# Patient Record
Sex: Female | Born: 1975 | Race: White | Hispanic: No | Marital: Married | Smoking: Never smoker
Health system: Southern US, Community
[De-identification: ages and names within clinical notes are randomized; demographics above are authoritative.]

## PROBLEM LIST (undated history)

## (undated) DIAGNOSIS — F419 Anxiety disorder, unspecified: Secondary | ICD-10-CM

## (undated) DIAGNOSIS — Z8669 Personal history of other diseases of the nervous system and sense organs: Secondary | ICD-10-CM

## (undated) DIAGNOSIS — Z9289 Personal history of other medical treatment: Secondary | ICD-10-CM

## (undated) DIAGNOSIS — F988 Other specified behavioral and emotional disorders with onset usually occurring in childhood and adolescence: Secondary | ICD-10-CM

## (undated) DIAGNOSIS — R1310 Dysphagia, unspecified: Secondary | ICD-10-CM

## (undated) DIAGNOSIS — F329 Major depressive disorder, single episode, unspecified: Secondary | ICD-10-CM

## (undated) DIAGNOSIS — R519 Headache, unspecified: Secondary | ICD-10-CM

## (undated) DIAGNOSIS — E119 Type 2 diabetes mellitus without complications: Secondary | ICD-10-CM

## (undated) DIAGNOSIS — K8020 Calculus of gallbladder without cholecystitis without obstruction: Secondary | ICD-10-CM

## (undated) DIAGNOSIS — K589 Irritable bowel syndrome without diarrhea: Secondary | ICD-10-CM

## (undated) DIAGNOSIS — R569 Unspecified convulsions: Secondary | ICD-10-CM

## (undated) DIAGNOSIS — C7961 Secondary malignant neoplasm of right ovary: Secondary | ICD-10-CM

## (undated) DIAGNOSIS — D509 Iron deficiency anemia, unspecified: Secondary | ICD-10-CM

## (undated) DIAGNOSIS — C189 Malignant neoplasm of colon, unspecified: Secondary | ICD-10-CM

## (undated) DIAGNOSIS — F32A Depression, unspecified: Secondary | ICD-10-CM

## (undated) DIAGNOSIS — K219 Gastro-esophageal reflux disease without esophagitis: Secondary | ICD-10-CM

## (undated) DIAGNOSIS — G2581 Restless legs syndrome: Secondary | ICD-10-CM

## (undated) DIAGNOSIS — R51 Headache: Secondary | ICD-10-CM

## (undated) DIAGNOSIS — R198 Other specified symptoms and signs involving the digestive system and abdomen: Secondary | ICD-10-CM

## (undated) HISTORY — DX: Type 2 diabetes mellitus without complications: E11.9

## (undated) HISTORY — DX: Depression, unspecified: F32.A

## (undated) HISTORY — PX: OTHER SURGICAL HISTORY: SHX169

## (undated) HISTORY — DX: Irritable bowel syndrome, unspecified: K58.9

## (undated) HISTORY — DX: Unspecified convulsions: R56.9

## (undated) HISTORY — DX: Major depressive disorder, single episode, unspecified: F32.9

## (undated) HISTORY — DX: Anxiety disorder, unspecified: F41.9

## (undated) HISTORY — DX: Iron deficiency anemia, unspecified: D50.9

## (undated) HISTORY — DX: Gastro-esophageal reflux disease without esophagitis: K21.9

## (undated) HISTORY — DX: Calculus of gallbladder without cholecystitis without obstruction: K80.20

---
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
## 2000-02-07 ENCOUNTER — Encounter: Admission: RE | Admit: 2000-02-07 | Discharge: 2000-02-07 | Payer: Self-pay | Admitting: Family Medicine

## 2000-02-20 ENCOUNTER — Encounter: Admission: RE | Admit: 2000-02-20 | Discharge: 2000-02-20 | Payer: Self-pay | Admitting: Sports Medicine

## 2000-03-20 ENCOUNTER — Encounter: Admission: RE | Admit: 2000-03-20 | Discharge: 2000-03-20 | Payer: Self-pay | Admitting: Family Medicine

## 2000-04-17 ENCOUNTER — Encounter: Admission: RE | Admit: 2000-04-17 | Discharge: 2000-04-17 | Payer: Self-pay | Admitting: Family Medicine

## 2000-04-22 ENCOUNTER — Ambulatory Visit (HOSPITAL_COMMUNITY): Admission: RE | Admit: 2000-04-22 | Discharge: 2000-04-22 | Payer: Self-pay | Admitting: Family Medicine

## 2000-05-26 ENCOUNTER — Emergency Department (HOSPITAL_COMMUNITY): Admission: EM | Admit: 2000-05-26 | Discharge: 2000-05-26 | Payer: Self-pay | Admitting: *Deleted

## 2000-05-26 ENCOUNTER — Encounter: Payer: Self-pay | Admitting: *Deleted

## 2000-05-27 ENCOUNTER — Encounter: Admission: RE | Admit: 2000-05-27 | Discharge: 2000-05-27 | Payer: Self-pay | Admitting: Family Medicine

## 2000-06-10 ENCOUNTER — Encounter: Admission: RE | Admit: 2000-06-10 | Discharge: 2000-06-10 | Payer: Self-pay | Admitting: Family Medicine

## 2000-06-25 ENCOUNTER — Encounter: Admission: RE | Admit: 2000-06-25 | Discharge: 2000-06-25 | Payer: Self-pay | Admitting: Family Medicine

## 2000-07-09 ENCOUNTER — Encounter: Admission: RE | Admit: 2000-07-09 | Discharge: 2000-07-09 | Payer: Self-pay | Admitting: Family Medicine

## 2000-07-23 ENCOUNTER — Encounter: Admission: RE | Admit: 2000-07-23 | Discharge: 2000-07-23 | Payer: Self-pay | Admitting: Sports Medicine

## 2000-08-06 ENCOUNTER — Encounter: Admission: RE | Admit: 2000-08-06 | Discharge: 2000-08-06 | Payer: Self-pay | Admitting: Family Medicine

## 2000-08-20 ENCOUNTER — Encounter: Admission: RE | Admit: 2000-08-20 | Discharge: 2000-08-20 | Payer: Self-pay | Admitting: Family Medicine

## 2000-09-03 ENCOUNTER — Encounter: Admission: RE | Admit: 2000-09-03 | Discharge: 2000-09-03 | Payer: Self-pay | Admitting: Family Medicine

## 2000-09-09 ENCOUNTER — Encounter: Admission: RE | Admit: 2000-09-09 | Discharge: 2000-09-09 | Payer: Self-pay | Admitting: Family Medicine

## 2000-09-16 ENCOUNTER — Encounter: Admission: RE | Admit: 2000-09-16 | Discharge: 2000-09-16 | Payer: Self-pay | Admitting: Family Medicine

## 2000-09-19 ENCOUNTER — Inpatient Hospital Stay (HOSPITAL_COMMUNITY): Admission: AD | Admit: 2000-09-19 | Discharge: 2000-09-21 | Payer: Self-pay | Admitting: Obstetrics

## 2000-10-22 ENCOUNTER — Encounter: Admission: RE | Admit: 2000-10-22 | Discharge: 2000-10-22 | Payer: Self-pay | Admitting: Obstetrics & Gynecology

## 2000-10-22 ENCOUNTER — Other Ambulatory Visit: Admission: RE | Admit: 2000-10-22 | Discharge: 2000-10-22 | Payer: Self-pay | Admitting: Obstetrics & Gynecology

## 2000-11-04 ENCOUNTER — Ambulatory Visit (HOSPITAL_COMMUNITY): Admission: RE | Admit: 2000-11-04 | Discharge: 2000-11-04 | Payer: Self-pay | Admitting: Obstetrics & Gynecology

## 2000-11-07 HISTORY — PX: TUBAL LIGATION: SHX77

## 2001-04-02 ENCOUNTER — Encounter: Admission: RE | Admit: 2001-04-02 | Discharge: 2001-04-02 | Payer: Self-pay | Admitting: Family Medicine

## 2001-05-02 ENCOUNTER — Encounter: Admission: RE | Admit: 2001-05-02 | Discharge: 2001-05-02 | Payer: Self-pay | Admitting: Family Medicine

## 2001-06-25 ENCOUNTER — Encounter: Admission: RE | Admit: 2001-06-25 | Discharge: 2001-06-25 | Payer: Self-pay | Admitting: Family Medicine

## 2001-12-19 ENCOUNTER — Emergency Department (HOSPITAL_COMMUNITY): Admission: EM | Admit: 2001-12-19 | Discharge: 2001-12-19 | Payer: Self-pay | Admitting: Emergency Medicine

## 2002-05-01 ENCOUNTER — Encounter: Admission: RE | Admit: 2002-05-01 | Discharge: 2002-05-01 | Payer: Self-pay | Admitting: Family Medicine

## 2002-06-01 ENCOUNTER — Encounter: Admission: RE | Admit: 2002-06-01 | Discharge: 2002-06-01 | Payer: Self-pay | Admitting: Family Medicine

## 2002-08-03 ENCOUNTER — Other Ambulatory Visit: Admission: RE | Admit: 2002-08-03 | Discharge: 2002-08-03 | Payer: Self-pay | Admitting: Family Medicine

## 2002-08-03 ENCOUNTER — Encounter: Payer: Self-pay | Admitting: Family Medicine

## 2002-08-03 ENCOUNTER — Encounter: Admission: RE | Admit: 2002-08-03 | Discharge: 2002-08-03 | Payer: Self-pay | Admitting: Family Medicine

## 2002-08-28 ENCOUNTER — Encounter: Admission: RE | Admit: 2002-08-28 | Discharge: 2002-08-28 | Payer: Self-pay | Admitting: Sports Medicine

## 2002-09-01 ENCOUNTER — Ambulatory Visit (HOSPITAL_COMMUNITY): Admission: RE | Admit: 2002-09-01 | Discharge: 2002-09-01 | Payer: Self-pay | Admitting: Sports Medicine

## 2002-09-11 ENCOUNTER — Encounter: Admission: RE | Admit: 2002-09-11 | Discharge: 2002-09-11 | Payer: Self-pay | Admitting: Family Medicine

## 2002-10-09 ENCOUNTER — Encounter: Admission: RE | Admit: 2002-10-09 | Discharge: 2002-10-09 | Payer: Self-pay | Admitting: Family Medicine

## 2002-11-06 ENCOUNTER — Encounter: Admission: RE | Admit: 2002-11-06 | Discharge: 2002-11-06 | Payer: Self-pay | Admitting: Family Medicine

## 2003-01-12 ENCOUNTER — Emergency Department (HOSPITAL_COMMUNITY): Admission: EM | Admit: 2003-01-12 | Discharge: 2003-01-12 | Payer: Self-pay | Admitting: Emergency Medicine

## 2003-06-14 ENCOUNTER — Encounter: Admission: RE | Admit: 2003-06-14 | Discharge: 2003-06-14 | Payer: Self-pay | Admitting: Sports Medicine

## 2003-06-15 ENCOUNTER — Encounter: Admission: RE | Admit: 2003-06-15 | Discharge: 2003-06-15 | Payer: Self-pay | Admitting: Family Medicine

## 2003-07-12 ENCOUNTER — Encounter: Admission: RE | Admit: 2003-07-12 | Discharge: 2003-07-12 | Payer: Self-pay | Admitting: Family Medicine

## 2003-09-17 ENCOUNTER — Encounter: Admission: RE | Admit: 2003-09-17 | Discharge: 2003-09-17 | Payer: Self-pay | Admitting: Family Medicine

## 2003-11-01 ENCOUNTER — Ambulatory Visit (HOSPITAL_COMMUNITY): Admission: RE | Admit: 2003-11-01 | Discharge: 2003-11-01 | Payer: Self-pay | Admitting: Family Medicine

## 2003-11-01 ENCOUNTER — Ambulatory Visit: Payer: Self-pay | Admitting: Family Medicine

## 2003-11-01 HISTORY — PX: CYSTOSCOPY: SHX5120

## 2003-11-01 HISTORY — PX: ENDOMETRIAL FULGURATION: SHX1500

## 2003-11-01 HISTORY — PX: LAPAROSCOPIC LYSIS OF ADHESIONS: SHX5905

## 2003-11-15 ENCOUNTER — Ambulatory Visit: Payer: Self-pay | Admitting: Family Medicine

## 2003-11-18 ENCOUNTER — Ambulatory Visit: Payer: Self-pay | Admitting: Family Medicine

## 2003-11-29 ENCOUNTER — Ambulatory Visit: Payer: Self-pay | Admitting: Family Medicine

## 2004-01-13 ENCOUNTER — Ambulatory Visit: Payer: Self-pay | Admitting: Family Medicine

## 2004-02-10 ENCOUNTER — Ambulatory Visit: Payer: Self-pay | Admitting: Family Medicine

## 2004-03-29 ENCOUNTER — Emergency Department (HOSPITAL_COMMUNITY): Admission: EM | Admit: 2004-03-29 | Discharge: 2004-03-29 | Payer: Self-pay | Admitting: Emergency Medicine

## 2004-06-05 ENCOUNTER — Encounter: Payer: Self-pay | Admitting: Pulmonary Disease

## 2004-06-05 ENCOUNTER — Ambulatory Visit: Admission: RE | Admit: 2004-06-05 | Discharge: 2004-06-05 | Payer: Self-pay | Admitting: Family Medicine

## 2004-06-09 ENCOUNTER — Ambulatory Visit: Payer: Self-pay | Admitting: Pulmonary Disease

## 2004-08-24 ENCOUNTER — Encounter: Payer: Self-pay | Admitting: Obstetrics and Gynecology

## 2004-08-24 ENCOUNTER — Inpatient Hospital Stay (HOSPITAL_COMMUNITY): Admission: RE | Admit: 2004-08-24 | Discharge: 2004-08-27 | Payer: Self-pay | Admitting: Obstetrics and Gynecology

## 2004-08-24 HISTORY — PX: ABDOMINAL HYSTERECTOMY: SHX81

## 2004-08-24 HISTORY — PX: LEFT OOPHORECTOMY: SHX1961

## 2004-08-24 HISTORY — PX: PANNICULECTOMY: SHX5360

## 2004-08-24 HISTORY — PX: APPENDECTOMY: SHX54

## 2004-08-24 HISTORY — PX: UNILATERAL SALPINGECTOMY: SHX6160

## 2004-08-31 ENCOUNTER — Ambulatory Visit: Payer: Self-pay | Admitting: Pulmonary Disease

## 2004-10-23 ENCOUNTER — Ambulatory Visit: Payer: Self-pay | Admitting: Pulmonary Disease

## 2004-11-20 ENCOUNTER — Encounter (INDEPENDENT_AMBULATORY_CARE_PROVIDER_SITE_OTHER): Payer: Self-pay | Admitting: Family Medicine

## 2004-11-20 ENCOUNTER — Ambulatory Visit: Payer: Self-pay | Admitting: Pulmonary Disease

## 2006-01-07 ENCOUNTER — Other Ambulatory Visit: Admission: RE | Admit: 2006-01-07 | Discharge: 2006-01-07 | Payer: Self-pay | Admitting: Emergency Medicine

## 2006-02-24 ENCOUNTER — Emergency Department (HOSPITAL_COMMUNITY): Admission: EM | Admit: 2006-02-24 | Discharge: 2006-02-24 | Payer: Self-pay | Admitting: Emergency Medicine

## 2006-04-07 ENCOUNTER — Emergency Department (HOSPITAL_COMMUNITY): Admission: EM | Admit: 2006-04-07 | Discharge: 2006-04-07 | Payer: Self-pay | Admitting: Emergency Medicine

## 2006-08-16 ENCOUNTER — Ambulatory Visit: Payer: Self-pay | Admitting: Family Medicine

## 2006-08-16 ENCOUNTER — Ambulatory Visit (HOSPITAL_COMMUNITY): Admission: RE | Admit: 2006-08-16 | Discharge: 2006-08-16 | Payer: Self-pay | Admitting: Family Medicine

## 2006-08-16 DIAGNOSIS — K589 Irritable bowel syndrome without diarrhea: Secondary | ICD-10-CM

## 2006-08-16 DIAGNOSIS — G2581 Restless legs syndrome: Secondary | ICD-10-CM

## 2006-08-16 DIAGNOSIS — K219 Gastro-esophageal reflux disease without esophagitis: Secondary | ICD-10-CM | POA: Insufficient documentation

## 2006-08-16 DIAGNOSIS — G43009 Migraine without aura, not intractable, without status migrainosus: Secondary | ICD-10-CM | POA: Insufficient documentation

## 2006-08-16 DIAGNOSIS — E785 Hyperlipidemia, unspecified: Secondary | ICD-10-CM | POA: Insufficient documentation

## 2006-08-16 LAB — CONVERTED CEMR LAB: Pap Smear: NORMAL

## 2006-08-17 ENCOUNTER — Encounter (INDEPENDENT_AMBULATORY_CARE_PROVIDER_SITE_OTHER): Payer: Self-pay | Admitting: Family Medicine

## 2006-08-18 LAB — CONVERTED CEMR LAB
ALT: 95 units/L — ABNORMAL HIGH (ref 0–35)
AST: 64 units/L — ABNORMAL HIGH (ref 0–37)
Albumin: 4.4 g/dL (ref 3.5–5.2)
CO2: 21 meq/L (ref 19–32)
Calcium: 9.5 mg/dL (ref 8.4–10.5)
Cholesterol: 199 mg/dL (ref 0–200)
Eosinophils Absolute: 0.1 10*3/uL (ref 0.0–0.7)
HCT: 44 % (ref 36.0–46.0)
HDL: 37 mg/dL — ABNORMAL LOW (ref >39)
LDL Cholesterol: 129 mg/dL — ABNORMAL HIGH (ref 0–99)
Lymphs Abs: 1.7 10*3/uL (ref 0.7–3.3)
Monocytes Absolute: 0.3 10*3/uL (ref 0.2–0.7)
Neutro Abs: 3.6 10*3/uL (ref 1.7–7.7)
Neutrophils Relative %: 64 % (ref 43–77)
Platelets: 204 10*3/uL (ref 150–400)
RBC: 5.15 M/uL — ABNORMAL HIGH (ref 3.87–5.11)
Total Bilirubin: 0.8 mg/dL (ref 0.3–1.2)
Total CHOL/HDL Ratio: 5.4
Total Protein: 7.5 g/dL (ref 6.0–8.3)
Triglycerides: 165 mg/dL — ABNORMAL HIGH (ref <150)
WBC: 5.7 10*3/uL (ref 4.0–10.5)

## 2006-08-19 ENCOUNTER — Telehealth (INDEPENDENT_AMBULATORY_CARE_PROVIDER_SITE_OTHER): Payer: Self-pay | Admitting: *Deleted

## 2006-08-20 ENCOUNTER — Encounter (INDEPENDENT_AMBULATORY_CARE_PROVIDER_SITE_OTHER): Payer: Self-pay | Admitting: Family Medicine

## 2006-08-30 ENCOUNTER — Ambulatory Visit: Payer: Self-pay | Admitting: Family Medicine

## 2006-08-31 ENCOUNTER — Encounter (INDEPENDENT_AMBULATORY_CARE_PROVIDER_SITE_OTHER): Payer: Self-pay | Admitting: Family Medicine

## 2006-09-02 LAB — CONVERTED CEMR LAB
HCV Ab: NEGATIVE
Hep A IgM: NEGATIVE
Hepatitis B Surface Ag: NEGATIVE

## 2006-09-03 ENCOUNTER — Telehealth (INDEPENDENT_AMBULATORY_CARE_PROVIDER_SITE_OTHER): Payer: Self-pay | Admitting: *Deleted

## 2006-09-03 ENCOUNTER — Encounter (INDEPENDENT_AMBULATORY_CARE_PROVIDER_SITE_OTHER): Payer: Self-pay | Admitting: Family Medicine

## 2006-09-05 ENCOUNTER — Ambulatory Visit (HOSPITAL_COMMUNITY): Admission: RE | Admit: 2006-09-05 | Discharge: 2006-09-05 | Payer: Self-pay | Admitting: Family Medicine

## 2006-09-05 ENCOUNTER — Encounter (INDEPENDENT_AMBULATORY_CARE_PROVIDER_SITE_OTHER): Payer: Self-pay | Admitting: Family Medicine

## 2006-09-05 ENCOUNTER — Telehealth (INDEPENDENT_AMBULATORY_CARE_PROVIDER_SITE_OTHER): Payer: Self-pay | Admitting: *Deleted

## 2006-09-06 ENCOUNTER — Encounter (INDEPENDENT_AMBULATORY_CARE_PROVIDER_SITE_OTHER): Payer: Self-pay | Admitting: Family Medicine

## 2006-09-18 ENCOUNTER — Encounter (INDEPENDENT_AMBULATORY_CARE_PROVIDER_SITE_OTHER): Payer: Self-pay | Admitting: Family Medicine

## 2006-09-19 ENCOUNTER — Ambulatory Visit: Payer: Self-pay | Admitting: Family Medicine

## 2006-09-19 DIAGNOSIS — F341 Dysthymic disorder: Secondary | ICD-10-CM | POA: Insufficient documentation

## 2006-09-30 ENCOUNTER — Ambulatory Visit: Payer: Self-pay | Admitting: Family Medicine

## 2006-10-11 ENCOUNTER — Ambulatory Visit: Payer: Self-pay | Admitting: Family Medicine

## 2006-10-18 ENCOUNTER — Telehealth (INDEPENDENT_AMBULATORY_CARE_PROVIDER_SITE_OTHER): Payer: Self-pay | Admitting: *Deleted

## 2006-10-29 ENCOUNTER — Telehealth (INDEPENDENT_AMBULATORY_CARE_PROVIDER_SITE_OTHER): Payer: Self-pay | Admitting: *Deleted

## 2006-11-12 ENCOUNTER — Ambulatory Visit: Payer: Self-pay | Admitting: Family Medicine

## 2006-11-12 ENCOUNTER — Telehealth (INDEPENDENT_AMBULATORY_CARE_PROVIDER_SITE_OTHER): Payer: Self-pay | Admitting: *Deleted

## 2006-11-13 ENCOUNTER — Encounter (INDEPENDENT_AMBULATORY_CARE_PROVIDER_SITE_OTHER): Payer: Self-pay | Admitting: Family Medicine

## 2006-11-13 LAB — CONVERTED CEMR LAB
Bilirubin, Direct: 0.2 mg/dL (ref 0.0–0.3)
Indirect Bilirubin: 0.7 mg/dL (ref 0.0–0.9)

## 2006-11-21 ENCOUNTER — Ambulatory Visit: Payer: Self-pay | Admitting: Pulmonary Disease

## 2006-12-12 ENCOUNTER — Ambulatory Visit: Payer: Self-pay | Admitting: Family Medicine

## 2006-12-20 ENCOUNTER — Encounter (INDEPENDENT_AMBULATORY_CARE_PROVIDER_SITE_OTHER): Payer: Self-pay | Admitting: Family Medicine

## 2006-12-23 ENCOUNTER — Encounter (INDEPENDENT_AMBULATORY_CARE_PROVIDER_SITE_OTHER): Payer: Self-pay | Admitting: Family Medicine

## 2007-01-01 ENCOUNTER — Encounter (INDEPENDENT_AMBULATORY_CARE_PROVIDER_SITE_OTHER): Payer: Self-pay | Admitting: Family Medicine

## 2007-01-07 ENCOUNTER — Encounter: Payer: Self-pay | Admitting: Pulmonary Disease

## 2007-01-07 ENCOUNTER — Encounter (INDEPENDENT_AMBULATORY_CARE_PROVIDER_SITE_OTHER): Payer: Self-pay | Admitting: Family Medicine

## 2007-01-15 ENCOUNTER — Ambulatory Visit: Payer: Self-pay | Admitting: Family Medicine

## 2007-01-16 ENCOUNTER — Telehealth (INDEPENDENT_AMBULATORY_CARE_PROVIDER_SITE_OTHER): Payer: Self-pay | Admitting: *Deleted

## 2007-01-16 LAB — CONVERTED CEMR LAB: Bilirubin, Direct: 0.1 mg/dL (ref 0.0–0.3)

## 2007-01-21 ENCOUNTER — Telehealth (INDEPENDENT_AMBULATORY_CARE_PROVIDER_SITE_OTHER): Payer: Self-pay | Admitting: *Deleted

## 2007-01-21 ENCOUNTER — Ambulatory Visit: Payer: Self-pay | Admitting: Family Medicine

## 2007-01-22 ENCOUNTER — Telehealth (INDEPENDENT_AMBULATORY_CARE_PROVIDER_SITE_OTHER): Payer: Self-pay | Admitting: *Deleted

## 2007-01-22 ENCOUNTER — Encounter (INDEPENDENT_AMBULATORY_CARE_PROVIDER_SITE_OTHER): Payer: Self-pay | Admitting: Family Medicine

## 2007-01-24 ENCOUNTER — Telehealth (INDEPENDENT_AMBULATORY_CARE_PROVIDER_SITE_OTHER): Payer: Self-pay | Admitting: *Deleted

## 2007-01-27 ENCOUNTER — Encounter (INDEPENDENT_AMBULATORY_CARE_PROVIDER_SITE_OTHER): Payer: Self-pay | Admitting: Family Medicine

## 2007-02-04 ENCOUNTER — Encounter (INDEPENDENT_AMBULATORY_CARE_PROVIDER_SITE_OTHER): Payer: Self-pay | Admitting: Family Medicine

## 2007-02-07 ENCOUNTER — Ambulatory Visit: Payer: Self-pay | Admitting: Pulmonary Disease

## 2007-02-14 ENCOUNTER — Telehealth (INDEPENDENT_AMBULATORY_CARE_PROVIDER_SITE_OTHER): Payer: Self-pay | Admitting: *Deleted

## 2007-02-27 ENCOUNTER — Ambulatory Visit: Payer: Self-pay | Admitting: Family Medicine

## 2007-03-19 ENCOUNTER — Ambulatory Visit: Payer: Self-pay | Admitting: Family Medicine

## 2007-03-27 ENCOUNTER — Ambulatory Visit (HOSPITAL_COMMUNITY): Payer: Self-pay | Admitting: Psychiatry

## 2007-04-01 ENCOUNTER — Ambulatory Visit: Payer: Self-pay | Admitting: Family Medicine

## 2007-04-29 ENCOUNTER — Ambulatory Visit: Payer: Self-pay | Admitting: Family Medicine

## 2007-04-29 LAB — CONVERTED CEMR LAB: Inflenza A Ag: NEGATIVE

## 2007-05-01 ENCOUNTER — Telehealth (INDEPENDENT_AMBULATORY_CARE_PROVIDER_SITE_OTHER): Payer: Self-pay | Admitting: Family Medicine

## 2007-05-06 ENCOUNTER — Ambulatory Visit: Payer: Self-pay | Admitting: Family Medicine

## 2007-05-12 ENCOUNTER — Telehealth (INDEPENDENT_AMBULATORY_CARE_PROVIDER_SITE_OTHER): Payer: Self-pay | Admitting: Family Medicine

## 2007-05-14 ENCOUNTER — Telehealth (INDEPENDENT_AMBULATORY_CARE_PROVIDER_SITE_OTHER): Payer: Self-pay | Admitting: *Deleted

## 2007-05-14 ENCOUNTER — Ambulatory Visit (HOSPITAL_COMMUNITY): Admission: RE | Admit: 2007-05-14 | Discharge: 2007-05-14 | Payer: Self-pay | Admitting: Family Medicine

## 2007-05-15 ENCOUNTER — Encounter (INDEPENDENT_AMBULATORY_CARE_PROVIDER_SITE_OTHER): Payer: Self-pay | Admitting: Family Medicine

## 2007-05-19 ENCOUNTER — Telehealth (INDEPENDENT_AMBULATORY_CARE_PROVIDER_SITE_OTHER): Payer: Self-pay | Admitting: Family Medicine

## 2007-05-19 ENCOUNTER — Ambulatory Visit: Payer: Self-pay | Admitting: Family Medicine

## 2007-05-20 ENCOUNTER — Telehealth (INDEPENDENT_AMBULATORY_CARE_PROVIDER_SITE_OTHER): Payer: Self-pay | Admitting: Family Medicine

## 2007-06-16 ENCOUNTER — Ambulatory Visit: Payer: Self-pay | Admitting: Family Medicine

## 2007-07-18 ENCOUNTER — Ambulatory Visit: Payer: Self-pay | Admitting: Internal Medicine

## 2007-07-21 ENCOUNTER — Telehealth (INDEPENDENT_AMBULATORY_CARE_PROVIDER_SITE_OTHER): Payer: Self-pay | Admitting: *Deleted

## 2007-09-01 ENCOUNTER — Observation Stay (HOSPITAL_COMMUNITY): Admission: EM | Admit: 2007-09-01 | Discharge: 2007-09-03 | Payer: Self-pay | Admitting: Emergency Medicine

## 2007-09-04 ENCOUNTER — Telehealth (INDEPENDENT_AMBULATORY_CARE_PROVIDER_SITE_OTHER): Payer: Self-pay | Admitting: *Deleted

## 2007-09-05 ENCOUNTER — Ambulatory Visit: Payer: Self-pay | Admitting: Family Medicine

## 2007-09-09 ENCOUNTER — Encounter (INDEPENDENT_AMBULATORY_CARE_PROVIDER_SITE_OTHER): Payer: Self-pay | Admitting: Family Medicine

## 2007-09-23 ENCOUNTER — Encounter (INDEPENDENT_AMBULATORY_CARE_PROVIDER_SITE_OTHER): Payer: Self-pay | Admitting: Family Medicine

## 2007-09-29 ENCOUNTER — Encounter (INDEPENDENT_AMBULATORY_CARE_PROVIDER_SITE_OTHER): Payer: Self-pay | Admitting: Family Medicine

## 2007-10-01 ENCOUNTER — Ambulatory Visit: Payer: Self-pay | Admitting: Family Medicine

## 2007-10-31 ENCOUNTER — Ambulatory Visit: Payer: Self-pay | Admitting: Family Medicine

## 2007-11-01 ENCOUNTER — Encounter (INDEPENDENT_AMBULATORY_CARE_PROVIDER_SITE_OTHER): Payer: Self-pay | Admitting: Family Medicine

## 2007-11-01 LAB — CONVERTED CEMR LAB: Phenobarbital: 10.9 ug/mL — ABNORMAL LOW (ref 15.0–40.0)

## 2007-11-03 ENCOUNTER — Telehealth (INDEPENDENT_AMBULATORY_CARE_PROVIDER_SITE_OTHER): Payer: Self-pay | Admitting: *Deleted

## 2007-11-03 LAB — CONVERTED CEMR LAB
BUN: 13 mg/dL (ref 6–23)
Basophils Absolute: 0 10*3/uL (ref 0.0–0.1)
Basophils Relative: 0 % (ref 0–1)
CO2: 20 meq/L (ref 19–32)
Chloride: 106 meq/L (ref 96–112)
Creatinine, Ser: 0.62 mg/dL (ref 0.40–1.20)
Eosinophils Absolute: 0.1 10*3/uL (ref 0.0–0.7)
MCV: 85.7 fL (ref 78.0–100.0)
Neutrophils Relative %: 59 % (ref 43–77)
Platelets: 200 10*3/uL (ref 150–400)
RBC: 4.62 M/uL (ref 3.87–5.11)
Sodium: 138 meq/L (ref 135–145)
WBC: 5.2 10*3/uL (ref 4.0–10.5)

## 2007-11-12 ENCOUNTER — Ambulatory Visit: Payer: Self-pay | Admitting: Family Medicine

## 2007-11-22 ENCOUNTER — Encounter (INDEPENDENT_AMBULATORY_CARE_PROVIDER_SITE_OTHER): Payer: Self-pay | Admitting: Family Medicine

## 2007-11-27 ENCOUNTER — Telehealth (INDEPENDENT_AMBULATORY_CARE_PROVIDER_SITE_OTHER): Payer: Self-pay | Admitting: *Deleted

## 2007-12-18 ENCOUNTER — Emergency Department (HOSPITAL_COMMUNITY): Admission: EM | Admit: 2007-12-18 | Discharge: 2007-12-18 | Payer: Self-pay | Admitting: Emergency Medicine

## 2007-12-24 ENCOUNTER — Ambulatory Visit: Payer: Self-pay | Admitting: Family Medicine

## 2007-12-24 LAB — CONVERTED CEMR LAB

## 2008-01-15 ENCOUNTER — Encounter (INDEPENDENT_AMBULATORY_CARE_PROVIDER_SITE_OTHER): Payer: Self-pay | Admitting: Family Medicine

## 2008-02-18 ENCOUNTER — Ambulatory Visit: Payer: Self-pay | Admitting: Family Medicine

## 2008-02-20 LAB — CONVERTED CEMR LAB
Albumin: 4.6 g/dL (ref 3.5–5.2)
BUN: 14 mg/dL (ref 6–23)
Basophils Relative: 0 % (ref 0–1)
CO2: 23 meq/L (ref 19–32)
Chloride: 102 meq/L (ref 96–112)
Creatinine, Ser: 0.75 mg/dL (ref 0.40–1.20)
Eosinophils Absolute: 0 10*3/uL (ref 0.0–0.7)
Lymphs Abs: 2.2 10*3/uL (ref 0.7–4.0)
Monocytes Absolute: 0.4 10*3/uL (ref 0.1–1.0)
Neutrophils Relative %: 58 % (ref 43–77)
Platelets: 218 10*3/uL (ref 150–400)
Total Bilirubin: 0.4 mg/dL (ref 0.3–1.2)
Total Protein: 7.1 g/dL (ref 6.0–8.3)
VLDL: 36 mg/dL (ref 0–40)

## 2008-03-15 ENCOUNTER — Encounter (INDEPENDENT_AMBULATORY_CARE_PROVIDER_SITE_OTHER): Payer: Self-pay | Admitting: Family Medicine

## 2008-03-18 ENCOUNTER — Telehealth (INDEPENDENT_AMBULATORY_CARE_PROVIDER_SITE_OTHER): Payer: Self-pay | Admitting: Family Medicine

## 2008-03-31 ENCOUNTER — Ambulatory Visit: Payer: Self-pay | Admitting: Family Medicine

## 2008-04-12 ENCOUNTER — Ambulatory Visit: Payer: Self-pay | Admitting: Internal Medicine

## 2008-04-12 LAB — CONVERTED CEMR LAB
Inflenza A Ag: NEGATIVE
Influenza B Ag: NEGATIVE

## 2008-06-02 ENCOUNTER — Ambulatory Visit: Payer: Self-pay | Admitting: Family Medicine

## 2008-06-02 LAB — CONVERTED CEMR LAB
Bilirubin Urine: NEGATIVE
Blood in Urine, dipstick: NEGATIVE
Glucose, Urine, Semiquant: NEGATIVE
Nitrite: NEGATIVE
Protein, U semiquant: NEGATIVE
Urobilinogen, UA: 1
WBC Urine, dipstick: NEGATIVE
pH: 5.5

## 2008-06-10 ENCOUNTER — Encounter (INDEPENDENT_AMBULATORY_CARE_PROVIDER_SITE_OTHER): Payer: Self-pay | Admitting: Family Medicine

## 2008-06-29 ENCOUNTER — Ambulatory Visit: Payer: Self-pay | Admitting: Family Medicine

## 2008-07-04 ENCOUNTER — Emergency Department (HOSPITAL_COMMUNITY): Admission: EM | Admit: 2008-07-04 | Discharge: 2008-07-04 | Payer: Self-pay | Admitting: Emergency Medicine

## 2008-08-02 ENCOUNTER — Telehealth (INDEPENDENT_AMBULATORY_CARE_PROVIDER_SITE_OTHER): Payer: Self-pay | Admitting: Family Medicine

## 2008-08-02 ENCOUNTER — Inpatient Hospital Stay (HOSPITAL_COMMUNITY): Admission: EM | Admit: 2008-08-02 | Discharge: 2008-08-11 | Payer: Self-pay | Admitting: Emergency Medicine

## 2008-08-04 ENCOUNTER — Encounter (INDEPENDENT_AMBULATORY_CARE_PROVIDER_SITE_OTHER): Payer: Self-pay | Admitting: *Deleted

## 2008-08-04 ENCOUNTER — Encounter (INDEPENDENT_AMBULATORY_CARE_PROVIDER_SITE_OTHER): Payer: Self-pay | Admitting: Gastroenterology

## 2008-08-16 ENCOUNTER — Ambulatory Visit: Payer: Self-pay | Admitting: Family Medicine

## 2008-08-16 DIAGNOSIS — K3184 Gastroparesis: Secondary | ICD-10-CM

## 2008-09-10 ENCOUNTER — Ambulatory Visit (HOSPITAL_COMMUNITY): Admission: RE | Admit: 2008-09-10 | Discharge: 2008-09-10 | Payer: Self-pay | Admitting: Obstetrics and Gynecology

## 2008-09-28 ENCOUNTER — Ambulatory Visit: Payer: Self-pay | Admitting: Family Medicine

## 2008-11-24 ENCOUNTER — Ambulatory Visit: Payer: Self-pay | Admitting: Family Medicine

## 2009-01-10 ENCOUNTER — Ambulatory Visit: Payer: Self-pay | Admitting: Family

## 2009-01-10 LAB — CONVERTED CEMR LAB
Basophils Relative: 0.1 % (ref 0.0–3.0)
Eosinophils Absolute: 0.1 10*3/uL (ref 0.0–0.7)
Eosinophils Relative: 1 % (ref 0.0–5.0)
Hemoglobin: 13.4 g/dL (ref 12.0–15.0)
Iron: 51 ug/dL (ref 42–145)
MCHC: 34 g/dL (ref 30.0–36.0)
Platelets: 186 10*3/uL (ref 150.0–400.0)
RBC: 4.63 M/uL (ref 3.87–5.11)
RDW: 13.2 % (ref 11.5–14.6)
Saturation Ratios: 16.9 % — ABNORMAL LOW (ref 20.0–50.0)
Transferrin: 215.7 mg/dL (ref 212.0–360.0)

## 2009-01-11 ENCOUNTER — Encounter: Payer: Self-pay | Admitting: Family

## 2009-02-07 ENCOUNTER — Ambulatory Visit: Payer: Self-pay | Admitting: Family

## 2009-02-07 ENCOUNTER — Telehealth (INDEPENDENT_AMBULATORY_CARE_PROVIDER_SITE_OTHER): Payer: Self-pay | Admitting: *Deleted

## 2009-02-07 LAB — CONVERTED CEMR LAB
AST: 40 units/L — ABNORMAL HIGH (ref 0–37)
Albumin: 4.2 g/dL (ref 3.5–5.2)
BUN: 9 mg/dL (ref 6–23)
Basophils Absolute: 0 10*3/uL (ref 0.0–0.1)
Blood in Urine, dipstick: NEGATIVE
CO2: 26 meq/L (ref 19–32)
Eosinophils Absolute: 0.1 10*3/uL (ref 0.0–0.7)
Glucose, Bld: 108 mg/dL — ABNORMAL HIGH (ref 70–99)
HCT: 39.3 % (ref 36.0–46.0)
Hemoglobin: 13.2 g/dL (ref 12.0–15.0)
Indirect Bilirubin: 0.4 mg/dL (ref 0.0–0.9)
Iron: 40 ug/dL — ABNORMAL LOW (ref 42–145)
Lymphocytes Relative: 30 % (ref 12–46)
MCHC: 33.6 g/dL (ref 30.0–36.0)
MCV: 82.9 fL (ref 78.0–100.0)
Monocytes Relative: 6 % (ref 3–12)
Neutro Abs: 3.5 10*3/uL (ref 1.7–7.7)
Neutrophils Relative %: 63 % (ref 43–77)
Nitrite: NEGATIVE
RBC: 4.74 M/uL (ref 3.87–5.11)
RDW: 13.5 % (ref 11.5–15.5)
Sodium: 141 meq/L (ref 135–145)
Total Protein: 6.8 g/dL (ref 6.0–8.3)
WBC: 5.6 10*3/uL (ref 4.0–10.5)

## 2009-02-08 ENCOUNTER — Encounter: Payer: Self-pay | Admitting: Family

## 2009-02-11 ENCOUNTER — Telehealth (INDEPENDENT_AMBULATORY_CARE_PROVIDER_SITE_OTHER): Payer: Self-pay | Admitting: *Deleted

## 2009-03-03 ENCOUNTER — Telehealth (INDEPENDENT_AMBULATORY_CARE_PROVIDER_SITE_OTHER): Payer: Self-pay | Admitting: *Deleted

## 2009-03-14 ENCOUNTER — Ambulatory Visit: Payer: Self-pay | Admitting: Family

## 2009-03-14 LAB — CONVERTED CEMR LAB: Carbamazepine Lvl: 9.6 ug/mL (ref 4.0–12.0)

## 2009-03-16 ENCOUNTER — Telehealth: Payer: Self-pay | Admitting: Family

## 2009-03-21 ENCOUNTER — Telehealth (INDEPENDENT_AMBULATORY_CARE_PROVIDER_SITE_OTHER): Payer: Self-pay | Admitting: *Deleted

## 2009-03-21 DIAGNOSIS — R7309 Other abnormal glucose: Secondary | ICD-10-CM

## 2009-03-21 DIAGNOSIS — R740 Nonspecific elevation of levels of transaminase and lactic acid dehydrogenase [LDH]: Secondary | ICD-10-CM

## 2009-03-22 ENCOUNTER — Ambulatory Visit: Payer: Self-pay | Admitting: Family

## 2009-03-22 LAB — CONVERTED CEMR LAB
Alkaline Phosphatase: 79 units/L (ref 39–117)
Bilirubin, Direct: 0.1 mg/dL (ref 0.0–0.3)
Hepatitis B Surface Ag: NEGATIVE
Total Bilirubin: 0.5 mg/dL (ref 0.3–1.2)

## 2009-03-23 ENCOUNTER — Encounter: Payer: Self-pay | Admitting: Family

## 2009-03-24 ENCOUNTER — Ambulatory Visit: Payer: Self-pay | Admitting: Diagnostic Radiology

## 2009-03-24 ENCOUNTER — Ambulatory Visit (HOSPITAL_BASED_OUTPATIENT_CLINIC_OR_DEPARTMENT_OTHER): Admission: RE | Admit: 2009-03-24 | Discharge: 2009-03-24 | Payer: Self-pay | Admitting: Internal Medicine

## 2009-03-24 ENCOUNTER — Telehealth (INDEPENDENT_AMBULATORY_CARE_PROVIDER_SITE_OTHER): Payer: Self-pay | Admitting: *Deleted

## 2009-03-24 DIAGNOSIS — K7689 Other specified diseases of liver: Secondary | ICD-10-CM | POA: Insufficient documentation

## 2009-03-28 ENCOUNTER — Ambulatory Visit: Payer: Self-pay | Admitting: Family

## 2009-03-28 ENCOUNTER — Telehealth (INDEPENDENT_AMBULATORY_CARE_PROVIDER_SITE_OTHER): Payer: Self-pay | Admitting: *Deleted

## 2009-03-28 LAB — CONVERTED CEMR LAB
Direct LDL: 160.1 mg/dL
HDL: 48.2 mg/dL (ref >39.00)
TSH: 1.14 microintl units/mL (ref 0.35–5.50)
Total CHOL/HDL Ratio: 5
VLDL: 36.4 mg/dL (ref 0.0–40.0)

## 2009-03-29 ENCOUNTER — Telehealth (INDEPENDENT_AMBULATORY_CARE_PROVIDER_SITE_OTHER): Payer: Self-pay | Admitting: *Deleted

## 2009-03-30 ENCOUNTER — Telehealth (INDEPENDENT_AMBULATORY_CARE_PROVIDER_SITE_OTHER): Payer: Self-pay | Admitting: *Deleted

## 2009-03-30 ENCOUNTER — Encounter (INDEPENDENT_AMBULATORY_CARE_PROVIDER_SITE_OTHER): Payer: Self-pay | Admitting: *Deleted

## 2009-03-31 ENCOUNTER — Telehealth: Payer: Self-pay | Admitting: Family

## 2009-04-04 ENCOUNTER — Telehealth (INDEPENDENT_AMBULATORY_CARE_PROVIDER_SITE_OTHER): Payer: Self-pay | Admitting: *Deleted

## 2009-04-05 ENCOUNTER — Encounter: Payer: Self-pay | Admitting: Internal Medicine

## 2009-04-13 ENCOUNTER — Ambulatory Visit: Payer: Self-pay | Admitting: Family

## 2009-04-13 LAB — CONVERTED CEMR LAB
ALT: 52 units/L — ABNORMAL HIGH (ref 0–35)
Bilirubin, Direct: 0.1 mg/dL (ref 0.0–0.3)
Total Bilirubin: 0.4 mg/dL (ref 0.3–1.2)

## 2009-04-14 ENCOUNTER — Telehealth (INDEPENDENT_AMBULATORY_CARE_PROVIDER_SITE_OTHER): Payer: Self-pay | Admitting: *Deleted

## 2009-04-21 ENCOUNTER — Ambulatory Visit (HOSPITAL_COMMUNITY): Admission: RE | Admit: 2009-04-21 | Discharge: 2009-04-21 | Payer: Self-pay | Admitting: Neurology

## 2009-04-22 ENCOUNTER — Telehealth: Payer: Self-pay | Admitting: Family

## 2009-05-02 ENCOUNTER — Encounter: Payer: Self-pay | Admitting: Internal Medicine

## 2009-05-07 ENCOUNTER — Emergency Department (HOSPITAL_COMMUNITY): Admission: EM | Admit: 2009-05-07 | Discharge: 2009-05-07 | Payer: Self-pay | Admitting: Emergency Medicine

## 2009-05-07 ENCOUNTER — Ambulatory Visit: Payer: Self-pay | Admitting: Family Medicine

## 2009-05-09 ENCOUNTER — Encounter: Payer: Self-pay | Admitting: Internal Medicine

## 2009-05-10 ENCOUNTER — Emergency Department (HOSPITAL_COMMUNITY): Admission: EM | Admit: 2009-05-10 | Discharge: 2009-05-10 | Payer: Self-pay | Admitting: Emergency Medicine

## 2009-05-10 ENCOUNTER — Telehealth: Payer: Self-pay | Admitting: Gastroenterology

## 2009-05-10 ENCOUNTER — Ambulatory Visit: Payer: Self-pay | Admitting: Family

## 2009-05-10 ENCOUNTER — Telehealth: Payer: Self-pay | Admitting: Family

## 2009-05-10 DIAGNOSIS — N83209 Unspecified ovarian cyst, unspecified side: Secondary | ICD-10-CM | POA: Insufficient documentation

## 2009-05-11 ENCOUNTER — Ambulatory Visit: Payer: Self-pay | Admitting: Gastroenterology

## 2009-05-11 ENCOUNTER — Encounter: Payer: Self-pay | Admitting: Internal Medicine

## 2009-05-11 LAB — CONVERTED CEMR LAB
Ferritin: 111.9 ng/mL (ref 10.0–291.0)
Iron: 39 ug/dL — ABNORMAL LOW (ref 42–145)
Saturation Ratios: 14.2 % — ABNORMAL LOW (ref 20.0–50.0)
Transferrin: 196.2 mg/dL — ABNORMAL LOW (ref 212.0–360.0)

## 2009-05-13 ENCOUNTER — Telehealth: Payer: Self-pay | Admitting: Family

## 2009-05-16 ENCOUNTER — Ambulatory Visit: Payer: Self-pay | Admitting: Internal Medicine

## 2009-05-17 ENCOUNTER — Ambulatory Visit: Payer: Self-pay | Admitting: Family

## 2009-05-17 DIAGNOSIS — R799 Abnormal finding of blood chemistry, unspecified: Secondary | ICD-10-CM

## 2009-05-17 DIAGNOSIS — D509 Iron deficiency anemia, unspecified: Secondary | ICD-10-CM | POA: Insufficient documentation

## 2009-05-18 ENCOUNTER — Ambulatory Visit: Payer: Self-pay | Admitting: Radiology

## 2009-05-18 ENCOUNTER — Ambulatory Visit (HOSPITAL_BASED_OUTPATIENT_CLINIC_OR_DEPARTMENT_OTHER): Admission: RE | Admit: 2009-05-18 | Discharge: 2009-05-18 | Payer: Self-pay | Admitting: Internal Medicine

## 2009-05-19 ENCOUNTER — Telehealth: Payer: Self-pay | Admitting: Internal Medicine

## 2009-05-19 LAB — CONVERTED CEMR LAB
Ceruloplasmin: 31 mg/dL (ref 21–63)
Hep B S Ab: NEGATIVE
Hepatitis B Surface Ag: NEGATIVE

## 2009-05-20 ENCOUNTER — Telehealth: Payer: Self-pay | Admitting: Family

## 2009-05-30 ENCOUNTER — Telehealth: Payer: Self-pay | Admitting: Internal Medicine

## 2009-06-01 ENCOUNTER — Ambulatory Visit: Payer: Self-pay | Admitting: Internal Medicine

## 2009-06-01 LAB — CONVERTED CEMR LAB
A-1 Antitrypsin, Ser: 183 mg/dL (ref 83–200)
ANA Titer 1: 1:160 {titer} — ABNORMAL HIGH
Ceruloplasmin: 33 mg/dL (ref 21–63)

## 2009-06-02 ENCOUNTER — Telehealth: Payer: Self-pay | Admitting: Family

## 2009-06-15 ENCOUNTER — Ambulatory Visit: Payer: Self-pay | Admitting: Family

## 2009-06-17 ENCOUNTER — Telehealth: Payer: Self-pay | Admitting: Family

## 2009-06-22 ENCOUNTER — Ambulatory Visit: Payer: Self-pay | Admitting: Internal Medicine

## 2009-07-22 ENCOUNTER — Ambulatory Visit (HOSPITAL_COMMUNITY): Admission: RE | Admit: 2009-07-22 | Discharge: 2009-07-22 | Payer: Self-pay | Admitting: Obstetrics and Gynecology

## 2009-07-28 ENCOUNTER — Ambulatory Visit: Payer: Self-pay | Admitting: Family

## 2009-08-05 ENCOUNTER — Telehealth: Payer: Self-pay | Admitting: Family

## 2009-08-08 ENCOUNTER — Emergency Department (HOSPITAL_COMMUNITY): Admission: EM | Admit: 2009-08-08 | Discharge: 2009-08-08 | Payer: Self-pay | Admitting: Emergency Medicine

## 2009-08-16 ENCOUNTER — Encounter: Payer: Self-pay | Admitting: Family

## 2009-08-25 ENCOUNTER — Telehealth (INDEPENDENT_AMBULATORY_CARE_PROVIDER_SITE_OTHER): Payer: Self-pay | Admitting: *Deleted

## 2009-08-30 ENCOUNTER — Encounter: Payer: Self-pay | Admitting: Family

## 2009-09-15 ENCOUNTER — Encounter: Payer: Self-pay | Admitting: Family

## 2009-09-16 ENCOUNTER — Telehealth: Payer: Self-pay | Admitting: Family

## 2009-09-16 ENCOUNTER — Encounter: Payer: Self-pay | Admitting: Family

## 2009-10-11 ENCOUNTER — Encounter: Payer: Self-pay | Admitting: Family

## 2009-10-28 ENCOUNTER — Encounter: Payer: Self-pay | Admitting: Internal Medicine

## 2009-10-28 ENCOUNTER — Ambulatory Visit: Payer: Self-pay | Admitting: Family

## 2009-10-28 LAB — CONVERTED CEMR LAB
Albumin: 4.4 g/dL (ref 3.5–5.2)
CO2: 28 meq/L (ref 19–32)
Calcium: 9.4 mg/dL (ref 8.4–10.5)
Creatinine, Ser: 0.76 mg/dL (ref 0.40–1.20)
Eosinophils Absolute: 0.1 10*3/uL (ref 0.0–0.7)
Eosinophils Relative: 1 % (ref 0–5)
Glucose, Bld: 105 mg/dL — ABNORMAL HIGH (ref 70–99)
HCT: 40.9 % (ref 36.0–46.0)
Hemoglobin: 13.9 g/dL (ref 12.0–15.0)
Iron: 43 ug/dL (ref 42–145)
Lymphocytes Relative: 30 % (ref 12–46)
Lymphs Abs: 1.8 10*3/uL (ref 0.7–4.0)
MCV: 83 fL (ref 78.0–100.0)
Monocytes Absolute: 0.3 10*3/uL (ref 0.1–1.0)
RDW: 13.6 % (ref 11.5–15.5)
Rapid Strep: NEGATIVE
Total Bilirubin: 0.4 mg/dL (ref 0.3–1.2)
Total Protein: 7 g/dL (ref 6.0–8.3)

## 2009-10-31 ENCOUNTER — Encounter: Payer: Self-pay | Admitting: Family

## 2009-11-08 ENCOUNTER — Telehealth: Payer: Self-pay | Admitting: Family

## 2009-11-18 ENCOUNTER — Telehealth: Payer: Self-pay | Admitting: Family

## 2009-11-29 ENCOUNTER — Ambulatory Visit: Payer: Self-pay | Admitting: Family

## 2009-12-01 ENCOUNTER — Encounter: Payer: Self-pay | Admitting: Family

## 2009-12-01 ENCOUNTER — Telehealth: Payer: Self-pay | Admitting: Family

## 2010-01-05 ENCOUNTER — Telehealth: Payer: Self-pay | Admitting: Family

## 2010-01-27 ENCOUNTER — Ambulatory Visit: Payer: Self-pay | Admitting: Family

## 2010-02-24 ENCOUNTER — Telehealth: Payer: Self-pay | Admitting: Family

## 2010-02-24 ENCOUNTER — Ambulatory Visit
Admission: RE | Admit: 2010-02-24 | Discharge: 2010-02-24 | Payer: Self-pay | Source: Home / Self Care | Attending: Family | Admitting: Family

## 2010-02-28 ENCOUNTER — Telehealth: Payer: Self-pay | Admitting: Family

## 2010-03-01 ENCOUNTER — Encounter: Payer: Self-pay | Admitting: Family

## 2010-03-01 ENCOUNTER — Ambulatory Visit
Admission: RE | Admit: 2010-03-01 | Discharge: 2010-03-01 | Payer: Self-pay | Source: Home / Self Care | Attending: Family | Admitting: Family

## 2010-03-02 ENCOUNTER — Telehealth: Payer: Self-pay | Admitting: Family

## 2010-03-02 ENCOUNTER — Encounter: Payer: Self-pay | Admitting: Family

## 2010-03-03 ENCOUNTER — Telehealth: Payer: Self-pay | Admitting: Family

## 2010-03-09 NOTE — Progress Notes (Signed)
Summary: REACTION TO NEW MED(S)// lmomx3  Phone Note Call from Patient Call back at Home Phone [phone]   Caller: Patient Call For: O'SULLIVAN,[name] Reason for Call: Talk to Nurse Summary of Call: PATIENT CALLING, C/O OF HANDS TWITCHING UNCONTRABLE, UNABLE TO TYPE, DO NORMAL ROUTINE THINGS W/HANDS.  PT BELIEVES THIS MAY BE DUE TO A NEW MEDICATION, SHE WAS RECENTLY PUT ON.  PT LAST SEEN 02-07-2009.  PLEASE CALL PT. Initial call taken by: Magdalen Spatz [facility],  March 03, 2009 2:35 PM  Follow-up for Phone Call        Spoke with pt who says she is on med Carbamazepine 200 mg taper dose. everytime she increases dose she has these signs then goes away, she is now on 2 in am and 2 in pm. twitching in hands when typing, Pt says she believes every time she goes up a dose her system has to adjust. Offer pt appt in am with [name], pt refused has class, Read pt Sandford Craze recommendation 02/11/09 ov note recommend to see former neuro until can get in with Neuro. Pt says she did not want to spend the $100 for that ov saving it toward [facility], she say she has appt with [facility] on 04/05/09. Recommend pt to ED if signs increase or worsen. Pt agreed .Kandice Hams  March 03, 2009 3:25 PM  Follow-up by: Kandice Hams,  March 03, 2009 3:25 PM  Additional Follow-up for Phone Call Additional follow up Details #1::        Please call patient and advise her that if she is still symptomatic she should drop the dose of carbamazepine to the lower dose if she was tolerating it better- 200mg  in AM 100mg  in PM. She should be seen for a visit as soon as possible and go to ER if symptoms worsen.   Additional Follow-up by: Lemont Fillers FNP,  March 03, 2009 8:34 PM    Additional Follow-up for Phone Call Additional follow up Details #2::    left message on machine ........Marland KitchenDoristine Devoid  March 04, 2009 4:16 PM  left message on machine .......Marland KitchenDoristine Devoid  March 09, 2009 9:37 AM    Patient left message on Chemira's VM requesting call back, left message on machine for pt to return call Shary Decamp  March 09, 2009 4:51 PM  patient never returned phone call........Marland KitchenDoristine Devoid  March 11, 2009 11:44 AM    Appended Document: REACTION TO NEW MED(S)// lmomx3 spoke w/ patient aware of [name]'s recommendations

## 2010-03-09 NOTE — Progress Notes (Signed)
  Phone Note Outgoing Call   Summary of Call: Patient came in with her husband for his appointment.  Requesting rx for Keppra- neurologist office will not return her call.  Gave her one print-out DAW. Initial call taken by: Lemont Fillers FNP,  November 20, 2009 8:50 PM    Prescriptions: KEPPRA XR 750 MG XR24H-TAB (LEVETIRACETAM) one tablet by mouth three times a day  #90 x 2   Entered and Authorized by:   Lemont Fillers FNP   Signed by:   Lemont Fillers FNP on 11/18/2009   Method used:   Print then Give to Patient   RxID:   [id number] KEPPRA XR 750 MG XR24H-TAB (LEVETIRACETAM) one tablet by mouth three times a day Brand medically necessary #90 x 2   Entered and Authorized by:   Lemont Fillers FNP   Signed by:   Lemont Fillers FNP on 11/18/2009   Method used:   Print then Give to Patient   RxID:   [id number] KEPPRA XR 750 MG XR24H-TAB (LEVETIRACETAM) one tablet by mouth three times a day  #90 x 2   Entered and Authorized by:   Lemont Fillers FNP   Signed by:   Lemont Fillers FNP on 11/18/2009   Method used:   Print then Give to Patient   RxID:   [id number]

## 2010-03-09 NOTE — Assessment & Plan Note (Signed)
Summary: ST   [name] pain/  hea--Rm 4   Vital Signs:  Patient profile:   35 year old female Menstrual status:  hysterectomy Height:      66.5 inches Weight:      191.25 pounds BMI:     30.52 Temp:     98.3 degrees F oral Pulse rate:   90 / minute Pulse rhythm:   regular Resp:     16 per minute BP sitting:   102 / 80  (right arm) Cuff size:   large  Vitals Entered By: [name] CMA [name]) (November 29, 2009 2:46 PM) CC: Rm 4  Pt states she has right ear pain, no energy, sinus drainage and sore throat. Is Patient Diabetic? No Pain Assessment Patient in pain? no      Comments Pt needs refill on simvastatin. States her neurologist is out of town and has been unable to get refills on Klonopin or Nuvigil and needs these. [name] CMA [name])  November 29, 2009 2:55 PM    Primary Care [name]:  Emily Craze, FNP  CC:  Rm 4  Pt states she has right ear pain, no energy, and sinus drainage and sore throat.Marland Kitchen  History of Present Illness: [name] is a 35 year old female who presents today with complaint of right ear pain and sore throat.  This started on Saturday.  Started with a sore throat.  +sinus drainage greenish.  Denies fever. Denies nausea, vomitting, or diarrhea.  Allergies: 1)  ! Reglan 2)  ! * Iv Phenergan 3)  ! * Eggs 4)  Penicillin  Past History:  Past Medical History: Last updated: 10/28/2009 Sz d/o- follows w/ Dr [name] depression/anxiety endometriosis- resolved w/ hysterectomy GERD IBS Chronic Headaches Hyperlipidemia gastroparesis  Past Surgical History: Last updated: 12/24/2007 Hysterectomy - Partial - severe endometriosis Post appendectomy secondary to above hx of migraine headaches  Physical Exam  General:  Well-developed,well-nourished,in no acute distress; alert,appropriate and cooperative throughout examination Head:  Normocephalic and atraumatic without obvious abnormalities. No apparent alopecia or balding. Eyes:   PERRLA Ears:  L TM- yellow/serous effusion.  No bulging. R TM normal. Mouth:  Oral mucosa and oropharynx without lesions or exudates.  Teeth in good repair. Lungs:  Normal respiratory effort, chest expands symmetrically. Lungs are clear to auscultation, no crackles or wheezes. Heart:  Normal rate and regular rhythm. S1 and S2 normal without gallop, murmur, click, rub or other extra sounds.   Impression & Recommendations:  Problem # 1:  SINUSITIS (ICD-473.9) Assessment New Will plan to treat with ceftin.   Her updated medication list for this problem includes:    Ceftin 500 Mg Tabs (Cefuroxime axetil) ..... One tablet by mouth by mouth two times a day for 10 days  Problem # 2:  OTITIS MEDIA, SEROUS, LEFT (ICD-381.4) Assessment: New Ceftin as for #1  Complete Medication List: 1)  Pristiq 100 Mg Xr24h-tab (Desvenlafaxine succinate) .... Take two by mouth at bedtime 2)  Klonopin 0.5 Mg Tabs (Clonazepam) .... Take 1 by mouth at bedtime and as needed 3)  Zofran 4 Mg Tabs (Ondansetron hcl) .... One very 6 hours as needed 4)  Omeprazole 20 Mg Tbec (Omeprazole) .... One tablet by mouth daily 5)  Fish Oil Concentrate 1000 Mg Caps (Omega-3 fatty acids) .... Two times a day 6)  Requip 3 Mg Tabs (Ropinirole hcl) .Marland Kitchen.. 1 tab nightly 7)  Womens Multivitamin Plus Tabs (Multiple vitamins-minerals) .... One tablet by mouth daily 8)  Keppra Xr 750 Mg  Xr24h-tab (Levetiracetam) .... One tablet by mouth three times a day 9)  Simvastatin 10 Mg Tabs (Simvastatin) .... One tab by mouth daily in the evening 10)  Lomotil 2.5-0.025 Mg Tabs (Diphenoxylate-atropine) .... One to two tablets by mouth every 6 hours as needed for diarrhea 11)  Ferrous Sulfate 325 (65 Fe) Mg Tabs (Ferrous sulfate) .... One tablet by mouth daily 12)  Zyrtec Allergy 10 Mg Caps (Cetirizine hcl) .... One tablet by mouth daily 13)  Nuvigil 250 Mg Tabs (Armodafinil) .Marland Kitchen.. 1- 1 1/2 tablet by mouth  every morning 14)  Ceftin 500 Mg Tabs  (Cefuroxime axetil) .... One tablet by mouth by mouth two times a day for 10 days  Patient Instructions: 1)  Call if you develop fever over 101, increasing sinus pressure, pain with eye movement, increased facial tenderness of swelling, or if you develop visual changes. Prescriptions: ZOFRAN 4 MG TABS (ONDANSETRON HCL) One very 6 hours as needed  #30 x 0   Entered and Authorized by:   Emily Fillers FNP   Signed by:   Emily Fillers FNP on 11/29/2009   Method used:   Electronically to        [facility].* (retail)       [address]       [place], [place]  [address]       Ph: [phone]       Fax: [phone]   RxID:   [id number] CEFTIN 500 MG TABS (CEFUROXIME AXETIL) one tablet by mouth by mouth two times a day for 10 days  #20 x 0   Entered and Authorized by:   Emily Fillers FNP   Signed by:   Emily Fillers FNP on 11/29/2009   Method used:   Electronically to        [facility] (retail)       [address]       [place], [place]  [address]       Ph: [phone]       Fax: [phone]   RxID:   [id number] KLONOPIN 0.5 MG TABS (CLONAZEPAM) take 1 by mouth at bedtime and as needed  #30 x 0   Entered and Authorized by:   Emily Fillers FNP   Signed by:   Emily Fillers FNP on 11/29/2009   Method used:   Print then Give to Patient   RxID:   [id number]    Orders Added: 1)  Est. Patient Level III [[address]]    Current Allergies (reviewed today): ! REGLAN ! * IV PHENERGAN ! * EGGS PENICILLIN

## 2010-03-09 NOTE — Letter (Signed)
Summary: Generic Letter  [place] [facility]  [address]   [place], [place] [address]   Phone: [phone]  Fax: [phone]    08/16/2009  Dr. Porfirio Mylar Dohmeier [facility] [address] [place], [place] [address]   Dear Dr. Vickey Huger,  I hope that you are well.  I am writing in regards to our mutual patient [name] DOB 1975-10-26.  I understand that she will need to be off of her Pristiq for her upcoming studies to evaluated for narcolepsy.  I have discussed this with her psychiatrist Dr. Cheree Ditto and he is recommending that she cut her Pristiq dose back from 100mg  by mouth daily to 50 mg by mouth daily for one week then stop.  He also recommends that she be off of this medication for the shortest possible duration due to withdrawl side effects.  She may be returned to her full dose of 100mg  when your work up is complete.      Sincerely,   Sandford Craze FNP  Appended Document: Generic Letter Letter faxed to [facility]) fax # [phone] @ 11:40am.

## 2010-03-09 NOTE — Letter (Signed)
   [place] at [facility] [address] [place], [place]  [address]  [place] Phone: [phone]      March 28, 2009   [facility] [address] [place], [place] [address]  RE:  LAB RESULTS  Dear  [name],  The following is an interpretation of your most recent lab tests.  Please take note of any instructions provided or changes to medications that have resulted from your lab work.   THYROID STUDIES:  Thyroid studies normal TSH: 1.14       Sincerely Yours,    Lemont Fillers FNP

## 2010-03-09 NOTE — Progress Notes (Signed)
Summary: carbamezepine refill   Phone Note Refill Request Message from:  Fax from Pharmacy on March 29, 2009 10:53 AM  Refills Requested: Medication #1:  CARBAMAZEPINE 200 MG TABS take as directed   Dosage confirmed as above?Dosage Confirmed   Brand Name Necessary? No   Supply Requested: 3 months   Last Refilled: 12/28/2008 [facility] [address] [place] [address] phone [phone] (fax # not on request)    Method Requested: Electronic Next Appointment Scheduled: 3-3- 9am gj  lab  Initial call taken by: Roselle Locus,  March 29, 2009 10:53 AM    Prescriptions: CARBAMAZEPINE 200 MG TABS (CARBAMAZEPINE) take as directed  #120 x 1   Entered by:   [name]   Authorized by:   Lemont Fillers FNP   Signed by:   [name] on 03/29/2009   Method used:   Electronically to        [facility].* (retail)       [address]       [place], [place]  [address]       Ph: [phone]       Fax: [phone]   RxID:   [id number]

## 2010-03-09 NOTE — Progress Notes (Signed)
Summary: note for school  Phone Note Call from Patient Call back at [phone]   Caller: Patient Call For: osullivan Summary of Call: was in on Tuesday to see [name] has been out of school since.  Needs a note for school.  Please call her if her husband can pick that up today  Initial call taken by: Roselle Locus,  December 01, 2009 11:48 AM  Follow-up for Phone Call        Spoke to pt and she states that she will be returning to school tomorrow and would like to pick up note after school excusing her from 10-25 through 10-27, return 10-28.  Nicki Guadalajara Fergerson CMA Duncan Dull)  December 01, 2009 11:55 AM      Appended Document: note for school Pt notified that letter is ready today. She will have her husband pick letter up.

## 2010-03-09 NOTE — Progress Notes (Signed)
Summary: biopsy result, derm. referral  Phone Note Call from Patient   Caller: Patient Call For: [facility] Summary of Call: Pt. left message on my voicemail requesting a return call.  Left message on machine to return my call.  Mervin Kung CMA  May 20, 2009 12:56 PM   Follow-up for Phone Call        When you speak with patient, pls also let her know that her skin biopsy looks ok, but in order to be certain she needs a wider/deeper biopsy which should be done by derm.  I will make referral. Follow-up by: Lemont Fillers FNP,  May 22, 2009 10:13 PM  Additional Follow-up for Phone Call Additional follow up Details #1::        Left message on machine to return call.  Mervin Kung CMA  May 25, 2009 9:03 AM   Pt notified of biopsy results and we will contact her with appt. date/time.  Pt requests referral to [facility] or [place] physician due to ins.   Mervin Kung CMA  May 25, 2009 4:21 PM

## 2010-03-09 NOTE — Progress Notes (Signed)
  Phone Note Outgoing Call   Call placed by: Lemont Fillers FNP,  March 16, 2009 4:46 PM Call placed to: Specialist Summary of Call: Called [facility] to discuss moving patient's visit up to earlier date.  Spoke with Dr. Thad Ranger in regards to patient's case.  It is his feeling that her hand symptoms may be due to carbemazepine and less likely due to seizure activity.  He suggested dilantin- I discussed cost of dilantin with patient (approx $50 a month per [facility]).  She tells me that she cannot afford this.  She tells me that her hand twitching is improved today.  She is in the process of reapplication of Keppra patient assist an will notify me if this comes through for her.  Dr. Thad Ranger tells me that he will try to get her appointment moved up to a sooner date.  Will plan to continue carbamazepine for now with hopes of returning soon to Keppra.  There are no other affordable alternatives at this point, unfortunately.  Will defer further changes to neuro.   Initial call taken by: Lemont Fillers FNP,  March 16, 2009 5:04 PM

## 2010-03-09 NOTE — Letter (Signed)
Summary: [facility]  [facility]   Imported By: Lanelle Bal 11/28/2009 10:51:47  _____________________________________________________________________  External Attachment:    Type:   Image     Comment:   External Document

## 2010-03-09 NOTE — Consult Note (Signed)
Summary: [facility]  [facility]   Imported By: Lanelle Bal 04/13/2009 14:24:45  _____________________________________________________________________  External Attachment:    Type:   Image     Comment:   External Document

## 2010-03-09 NOTE — Progress Notes (Signed)
Summary: unable to see neuro   Phone Note Outgoing Call   Call placed by: Darral Dash Details for Reason: Referral  information Summary of Call: Pt referral to [facility]  ,  call from Diane they do not take her insurance, they offered her appt, but she needed  to pay $200 up front, she stated she did not have the money and would call them back . Medication assistance through [facility]- information and web site   given to patient Initial call taken by: Darral Dash,  February 11, 2009 12:07 PM  Follow-up for Phone Call        Please instruct patient to follow back up with her former neurologist for now until other arrangements have been made. Thanks Follow-up by: Lemont Fillers FNP,  February 11, 2009 12:30 PM  Additional Follow-up for Phone Call Additional follow up Details #1::        spoke w/ patient aware of recommendations.......Marland KitchenDoristine Devoid  February 11, 2009 4:23 PM

## 2010-03-09 NOTE — Progress Notes (Signed)
  Phone Note Outgoing Call   Call placed by: Lemont Fillers FNP,  September 16, 2009 1:41 PM Call placed to: Patient Summary of Call: Called patient, left message for her to return our call.  Would like to ask patient if her insurance is still active, and if so- I would like to have her seen by dermatology to evaluate the remaining area of the mole that we removed before her insurance runs out. Thanks Initial call taken by: Lemont Fillers FNP,  September 16, 2009 1:42 PM  Follow-up for Phone Call        Pt called back, she would like to complete derm referral. Follow-up by: Lemont Fillers FNP,  September 16, 2009 4:38 PM

## 2010-03-09 NOTE — Assessment & Plan Note (Signed)
Summary: HAND PROBLEM AND WEAKINESS//PH   Vital Signs:  Patient profile:   35 year old female Menstrual status:  hysterectomy Weight:      200 pounds Temp:     98.1 degrees F oral Pulse rate:   62 / minute BP sitting:   120 / 80  (left arm)  Vitals Entered By: [name] (March 14, 2009 3:54 PM) CC: R hand painful and swollen also weakness in L hand and ears fill full , Lipid Management   Primary Care Provider:  Franchot Heidelberg, MD  CC:  R hand painful and swollen also weakness in L hand and ears fill full  and Lipid Management.  History of Present Illness: [name] is a 35 year old female who presents today for follow up. She notes that she has an appointment in early March with [facility].  She was started on gabapentin last visit for history of seizures as she was not able to afford Keppra.  Initially she started gabapentin and was tolerating, when she increased the dose of her gabapentin she notes that her fingers were "jumping" when typing.   She continues to have these symptoms.  Currently she is taking gabapentin 400mg  by mouth in AM and 200mg  by mouth in PM.  She notes "jumping" in left hand as well as dropping things with this hand, dropping started on sunday.  She notes + pain and swelling in her right hand.  Patient denies seizures.    Patient notes + chills/sweats since yesterday, + exposure to flu.  Ears feel full, having issues with balance.  Lipid Management History:      Negative NCEP/ATP III risk factors include female age less than 98 years old, no history of early menopause without estrogen hormone replacement, non-diabetic, no family history for ischemic heart disease, non-tobacco-user status, non-hypertensive, no ASHD (atherosclerotic heart disease), no prior stroke/TIA, no peripheral vascular disease, and no history of aortic aneurysm.        The patient states that she knows about the "Therapeutic Lifestyle Change" diet.  Her compliance with the TLC  diet is fair.    Allergies: 1)  ! Reglan 2)  Penicillin  Physical Exam  General:  awake alert, NAD, flat affect Head:  Normocephalic and atraumatic without obvious abnormalities. No apparent alopecia or balding. Eyes:  PERRLA Ears:  Bilateral serous effusion without erythema Mouth:  Oral mucosa and oropharynx without lesions or exudates.  Teeth in good repair. Neck:  No deformities, masses, or tenderness noted. Lungs:  Normal respiratory effort, chest expands symmetrically. Lungs are clear to auscultation, no crackles or wheezes. Heart:  Normal rate and regular rhythm. S1 and S2 normal without gallop, murmur, click, rub or other extra sounds. Msk:  strong equal hand grasps bilaterally.   Extremities:  no significant swelling noted on right hand, perhaps slight swelling on palmar surface beneath right thumb.  No redness, neg phalens, neg tinel sign bilaterally Neurologic:  No twitching noted, no tremors noted, MAE Psych:  flat affect and subdued.     Impression & Recommendations:  Problem # 1:  OTITIS MEDIA, SEROUS, ACUTE, BILATERAL (ICD-381.01) Will treat with bactrim which is on [facility] formulary.    Problem # 2:  SEIZURE DISORDER (ICD-780.39) Will plan to continue carbamazepine for now.  I did give patient a script for Keppra so that she can try re-application for patient assistance program.  She has an appointment with [facility] in early March.  I will check a tegretol level today.  It is possible that this "twitching" in her hands could be due to carbamazepine, but I am also concerned about the possibility of breakthrough seizure activity so I am hesitant to discontinue this medication at this point.   Her updated medication list for this problem includes:    Carbamazepine 200 Mg Tabs (Carbamazepine) .Marland Kitchen... Take as directed    Klonopin 0.5 Mg Tabs (Clonazepam) ..... One two times a day as needed    Keppra Xr 750 Mg Xr24h-tab (Levetiracetam) .Marland Kitchen... 2 tablets by mouth daily at  bedtime  Orders: T-Tegretol (Carbamazepine) ([id number])  Problem # 3:  HAND PAIN, RIGHT (ICD-729.5) I recommended a trial of NSAIDS,  I did not appreciate any significant hand weakness on exam today.    Complete Medication List: 1)  Pristiq 100 Mg Xr24h-tab (Desvenlafaxine succinate) .... One daily 2)  Butalbital-apap-caff-cod 50-325-40-30 Mg Caps (Butalbital-apap-caff-cod) .... One two times a day as per dr. Gerilyn Pilgrim for headache 3)  Carbamazepine 200 Mg Tabs (Carbamazepine) .... Take as directed 4)  Klonopin 0.5 Mg Tabs (Clonazepam) .... One two times a day as needed 5)  Zofran 4 Mg Tabs (Ondansetron hcl) .... One very 6 hours as needed 6)  Aciphex 20 Mg Tbec (Rabeprazole sodium) .... One tablet by mouth daily 7)  Fish Oil Concentrate 1000 Mg Caps (Omega-3 fatty acids) .... Two times a day 8)  Requip 3 Mg Tabs (Ropinirole hcl) .Marland Kitchen.. 1 tab nightly 9)  Womens Multivitamin Plus Tabs (Multiple vitamins-minerals) .... One tablet by mouth daily 10)  Keppra Xr 750 Mg Xr24h-tab (Levetiracetam) .... 2 tablets by mouth daily at bedtime 11)  Bactrim Ds 800-160 Mg Tabs (Sulfamethoxazole-trimethoprim) .... One tablet by mouth two times a day x 7 days  Lipid Assessment/Plan:      Based on NCEP/ATP III, the patient's risk factor category is "0-1 risk factors".  The patient's lipid goals are as follows: Total cholesterol goal is 200; LDL cholesterol goal is 160; HDL cholesterol goal is 40; Triglyceride goal is 150.     Patient Instructions: 1)  Please keep your appointment with neurology as scheduled.   2)  Call if you develop fever over 101, or if your symptoms are not resolved in  1 week.   3)  Please apply to patient assistance program with Keppra.  In the meantime continue gabapentin.   Prescriptions: BACTRIM DS 800-160 MG TABS (SULFAMETHOXAZOLE-TRIMETHOPRIM) one tablet by mouth two times a day x 7 days  #14 x 0   Entered and Authorized by:   Lemont Fillers FNP   Signed by:   Lemont Fillers FNP on 03/14/2009   Method used:   Print then Give to Patient   RxID:   [id number] KEPPRA XR 750 MG XR24H-TAB (LEVETIRACETAM) 2 tablets by mouth daily at bedtime  #60 x 2   Entered and Authorized by:   Lemont Fillers FNP   Signed by:   Lemont Fillers FNP on 03/14/2009   Method used:   Print then Give to Patient   RxID:   [id number]

## 2010-03-09 NOTE — Progress Notes (Signed)
Summary: Medication for Tension Headache  Phone Note Call from Patient Call back at Home Phone [phone]   Caller: Patient Call For: Lemont Fillers FNP Summary of Call: patient called and left voice message regarding medication for tension headache. Initial call taken by: Glendell Docker CMA,  March 02, 2010 5:21 PM  Follow-up for Phone Call        call returned to patient at [phone], husband stated patient was not available. He states she had Butalbital 50-325l  for headaches, he states  Onnie informed Efraim Kaufmann that it was for seizures, but found out  it has been prescribed for headaches. Her husband would like to know if a rx could be sent to [facility] for patients headaches. Follow-up by: Glendell Docker CMA,  March 03, 2010 9:01 AM  Additional Follow-up for Phone Call Additional follow up Details #1::        Left message on patient cell for her to return my call. Additional Follow-up by: Lemont Fillers FNP,  March 03, 2010 9:45 AM    Additional Follow-up for Phone Call Additional follow up Details #2::    Spoke to husband- he states that pt woke up without HA this AM.  Recommended that we not make any changes at this time.   Follow-up by: Lemont Fillers FNP,  March 03, 2010 9:48 AM

## 2010-03-09 NOTE — Medication Information (Signed)
Summary: Refaxed Patient Assistance Form/[facility]  Refaxed Patient Assistance Form/[facility]   Imported By: Lanelle Bal 05/18/2009 08:17:53  _____________________________________________________________________  External Attachment:    Type:   Image     Comment:   External Document

## 2010-03-09 NOTE — Assessment & Plan Note (Signed)
Summary: ER F/U / TF,CMA   Vital Signs:  Patient profile:   35 year old female Menstrual status:  hysterectomy Height:      66.50 inches Temp:     98.9 degrees F oral Pulse rate:   84 / minute Pulse rhythm:   regular Resp:     18 per minute BP sitting:   116 / 82  (right arm) Cuff size:   regular  Vitals Entered By: Mervin Kung CMA (May 10, 2009 2:29 PM) CC: room 5  ER follow up.  Pt. needs refills on Zofran and Aciphex.   Primary Care Provider:  Neena Rhymes MD  CC:  room 5  ER follow up.  Pt. needs refills on Zofran and Aciphex..  History of Present Illness: [name] is a 35 year old female who presents with complaint of diarrhea/abdominal pain on Friday.   Has had some stomach discomfort for 1-2 weeks.  Saturday she went to [facility] and was seen by Dr. Cathey Endow.  She was sent to [facility] ED for fluids.  She had a CT of the abdomen completed that day which was negative for any acute GI changes but did note a 7cm right adnexal cyst.  Patient was sent home and reports that Sunday diarrhea continued, but no vomitting. She took immodium without improvement.  Today she started vomitting again and has associated left sided abdominal pain.  She again returned to the [facility] ED and was hydrated, follow up laboratories were unremarkable.  She was given a potassium supplement for a mild hypokalemia. I did call Dr. Clarene Duke the ER physician who was caring for the patient and she told me that she thought that the patient has acute gastroenteritis and that this has been exacerbated by heavy greasy foods that the patient ingested this AM.    Seizures- Patient saw neurology and was told that she should continue carbamazepine until she can be started on Keppra, which was recently approved through patient assist.     Allergies: 1)  ! Reglan 2)  Penicillin  Physical Exam  General:  Well-developed,well-nourished,in no acute distress; alert,appropriate and cooperative throughout  examination Lungs:  Normal respiratory effort, chest expands symmetrically. Lungs are clear to auscultation, no crackles or wheezes. Heart:  Normal rate and regular rhythm. S1 and S2 normal without gallop, murmur, click, rub or other extra sounds. Abdomen:  mild left lower quadrant tenderness with mild abdominal distension.    Impression & Recommendations:  Problem # 1:  GASTROENTERITIS, ACUTE (ICD-558.9) Assessment New I suspect that her findings are due to acute gastroenteritis. She has had an extensive GI work up  last summer which included an EGD (gastritis) a gastric emptying scan (delayed gastric emptying 80% remaining at 2 hours).  She also had an abnormal HIDA performed during that hospitalization which noted delay GB emptying.  She was seen by Dr. Daphine Deutscher from surgery and it was decided that she would be monitored for now- they were hesitant to perform cholecystectomy at that time.  She was unable to follow up with Dr. Madilyn Fireman due to insurance.  A consultation has been arranged with  GI.  Add as needed lomotil for acute diarrhea.  Her updated medication list for this problem includes:    Zofran 4 Mg Tabs (Ondansetron hcl) ..... One very 6 hours as needed  Problem # 2:  GASTRITIS  - NODULAR PER EGD 2010 (ICD-535.50) Samples given of Aciphex.- lot [id number] exp 03/2010 Her updated medication list for this problem includes:  Aciphex 20 Mg Tbec (Rabeprazole sodium) ..... One tablet by mouth daily  Problem # 3:  OVARIAN CYST (ICD-620.2) Assessment: Comment Only 7 cm right adnexal cyst noted on CT- transvaginal ultrasound recommended.  Plan to order next visit when patient's GI complaints have resolved.    Problem # 4:  SEIZURE DISORDER (ICD-780.39) Assessment: Unchanged Med management per neuro, clinically stable.  Her updated medication list for this problem includes:    Carbamazepine 200 Mg Tabs (Carbamazepine) .Marland Kitchen... Take 1 tab by mouth at bedtime    Klonopin 0.5 Mg Tabs  (Clonazepam) ..... One two times a day as needed    Keppra Xr 750 Mg Xr24h-tab (Levetiracetam) .Marland Kitchen... 2 tablets by mouth daily at bedtime  Complete Medication List: 1)  Pristiq 100 Mg Xr24h-tab (Desvenlafaxine succinate) .... Take 1 tablet by mouth two times a day 2)  Butalbital-apap-caff-cod 50-325-40-30 Mg Caps (Butalbital-apap-caff-cod) .... One two times a day as per dr. Gerilyn Pilgrim for headache 3)  Carbamazepine 200 Mg Tabs (Carbamazepine) .... Take 1 tab by mouth at bedtime 4)  Klonopin 0.5 Mg Tabs (Clonazepam) .... One two times a day as needed 5)  Zofran 4 Mg Tabs (Ondansetron hcl) .... One very 6 hours as needed 6)  Aciphex 20 Mg Tbec (Rabeprazole sodium) .... One tablet by mouth daily 7)  Fish Oil Concentrate 1000 Mg Caps (Omega-3 fatty acids) .... Two times a day 8)  Requip 3 Mg Tabs (Ropinirole hcl) .Marland Kitchen.. 1 tab nightly 9)  Womens Multivitamin Plus Tabs (Multiple vitamins-minerals) .... One tablet by mouth daily 10)  Keppra Xr 750 Mg Xr24h-tab (Levetiracetam) .... 2 tablets by mouth daily at bedtime 11)  Simvastatin 10 Mg Tabs (Simvastatin) .... One tab by mouth daily in the evening 12)  Lomotil 2.5-0.025 Mg Tabs (Diphenoxylate-atropine) .... One to two tablets by mouth every 6 hours as needed for diarrhea  Patient Instructions: 1)  Please call if symptoms worsen or do not improve. 2)  Go to ER if severe. 3)  Follow up in 1 week Prescriptions: LOMOTIL 2.5-0.025 MG TABS (DIPHENOXYLATE-ATROPINE) one to two tablets by mouth every 6 hours as needed for diarrhea  #30 x 0   Entered and Authorized by:   Lemont Fillers FNP   Signed by:   Lemont Fillers FNP on 05/10/2009   Method used:   Print then Give to Patient   RxID:   [id number]   Current Allergies (reviewed today): ! REGLAN PENICILLIN

## 2010-03-09 NOTE — Progress Notes (Signed)
Summary: discuss labs-lmom //Pt returned call left new #  Phone Note Outgoing Call   Summary of Call: Please call [name] and let her know that her LDL (bad cholesterol) is elevated, I would like to start her on a low dose med for her cholesterol.  She should discontinue and call if she develops muscle pain on this medicine.  She needs f/u LFT's in 1 month (272.4) I sent to [facility] in [place] as it is on $4 plan. Initial call taken by: Lemont Fillers FNP,  March 28, 2009 10:12 PM  Follow-up for Phone Call        Left message on home number to return call. Pt left msg on VM returning call,  Call back at [phone] .Kandice Hams  March 29, 2009 2:58 PM  Follow-up by: Pearletha Furl CMA,  March 29, 2009 9:13 AM  Additional Follow-up for Phone Call Additional follow up Details #1::        patient called  gave her [name]'s message and made appt for follow up  Roselle Locus  March 29, 2009 3:02 PM    New/Updated Medications: SIMVASTATIN 10 MG TABS (SIMVASTATIN) one tab by mouth daily in the evening Prescriptions: SIMVASTATIN 10 MG TABS (SIMVASTATIN) one tab by mouth daily in the evening  #30 x 1   Entered and Authorized by:   Lemont Fillers FNP   Signed by:   Lemont Fillers FNP on 03/28/2009   Method used:   Electronically to        [facility].* (retail)       [address]       [place], [place]  [address]       Ph: [phone]       Fax: [phone]   RxID:   [id number]

## 2010-03-09 NOTE — Assessment & Plan Note (Signed)
Summary: 6 week follow up/mhf rsc with pt/mhf--Rm 4   Vital Signs:  Patient profile:   35 year old female Menstrual status:  hysterectomy Height:      66.5 inches Weight:      190 pounds BMI:     30.32 Temp:     98.0 degrees F oral Pulse rate:   84 / minute Pulse rhythm:   regular Resp:     16 per minute BP sitting:   110 / 80  (right arm) Cuff size:   regular  Vitals Entered By: [name] CMA (July 28, 2009 8:02 AM) CC: Room 4  6 week follow up. Is Patient Diabetic? No  Does patient need assistance? Functional Status Self care Comments Pt has completed Doxycycline.   Primary Care Provider:  Sandford Craze, FNP  CC:  Room 4  6 week follow up.Marland Kitchen  History of Present Illness: [name] is a 35 year old female who presents for follow up.  She initially had a + ANA which was performed by Dr. Marina [name] of GI due to history of elevated transaminase.  F/u titer was significantly lower.  It was ultimately decided that the patient's elevated transaminases were due to fatty liver.  Patient denies  joint or muscle pain, denies rashes. Denies family hx of autoimmune disease. Patient does report fatigue.  + snoring.  Reports normal sleep study in the past. Neurology has discussed evaluation for narcolepsy, but has not persued as of yet since that would have to take the patient off of her Keppra.     Patient also notes that she attibutes the abdominal pain, nausea and vomitting that she had recenty to an egg allergy.  She has been avoiding eggs and is feeling better.  Allergies: 1)  ! Reglan 2)  ! * Iv Phenergan 3)  ! * Eggs 4)  Penicillin  Physical Exam  General:  Well-developed,well-nourished,in no acute distress; alert,appropriate and cooperative throughout examination Lungs:  Normal respiratory effort, chest expands symmetrically. Lungs are clear to auscultation, no crackles or wheezes. Heart:  Normal rate and regular rhythm. S1 and S2 normal without gallop, murmur, click, rub  or other extra sounds. Abdomen:  Bowel sounds positive,abdomen soft and non-tender without masses, organomegaly or hernias noted.   Impression & Recommendations:  Problem # 1:  ANA POSITIVE (ICD-790.99) F/U titer was lower, she has no complaints of malar rash,  or joint pain.  Will defer further work up at this time.  Monitor.   aciphex 20 lot [id number] exp 02 2012 #9  Problem # 2:  Hx of GASTRITIS, HX OF (ICD-V12.79) Patient given samples of aciphex lot [id number] exp 2/12 #9.  When she completes the samples she will switch to omeprazole 20mg  which is less expensive.    Problem # 3:  FATTY LIVER DISEASE (ICD-571.8) Assessment: Comment Only Patient advised on diet, exercise and weight loss.  Also advised to sleep 8 hours every night (has only been sleeping 6 hours) then is tired and takes PM nap.  Problem # 4:  OVARIAN CYST (ICD-620.2) Assessment: Improved Resolved on follow up ultrasound perfomed at [facility] by her GYN.  Complete Medication List: 1)  Pristiq 100 Mg Xr24h-tab (Desvenlafaxine succinate) .... Take two by mouth at bedtime 2)  Klonopin 0.5 Mg Tabs (Clonazepam) .... Take 1 by mouth at bedtime and as needed 3)  Zofran 4 Mg Tabs (Ondansetron hcl) .... One very 6 hours as needed 4)  Omeprazole 20 Mg Tbec (Omeprazole) .... One tablet by  mouth daily 5)  Fish Oil Concentrate 1000 Mg Caps (Omega-3 fatty acids) .... Two times a day 6)  Requip 3 Mg Tabs (Ropinirole hcl) .Marland Kitchen.. 1 tab nightly 7)  Womens Multivitamin Plus Tabs (Multiple vitamins-minerals) .... One tablet by mouth daily 8)  Keppra Xr 750 Mg Xr24h-tab (Levetiracetam) .... 2 tablets by mouth daily at bedtime (waiting on assistance) 9)  Simvastatin 10 Mg Tabs (Simvastatin) .... One tab by mouth daily in the evening 10)  Lomotil 2.5-0.025 Mg Tabs (Diphenoxylate-atropine) .... One to two tablets by mouth every 6 hours as needed for diarrhea 11)  Ferrous Sulfate 325 (65 Fe) Mg Tabs (Ferrous sulfate) .... One tablet by mouth  daily 12)  Doxycycline Hyclate 100 Mg Caps (Doxycycline hyclate) .... One tablet by mouth two times a day x 10 days 13)  Zyrtec Allergy 10 Mg Caps (Cetirizine hcl) .... One tablet by mouth daily  Patient Instructions: 1)  Please schedule a follow-up appointment in 3 months. 2)  It is important that you exercise regularly at least 20 minutes 5 times a week. If you develop chest pain, have severe difficulty breathing, or feel very tired , stop exercising immediately and seek medical attention.  Current Allergies (reviewed today): ! REGLAN ! * IV PHENERGAN ! * EGGS PENICILLIN

## 2010-03-09 NOTE — Letter (Signed)
Summary: Request for Taper Schedule for Med/[facility]  Request for Taper Schedule for Med/[facility]   Imported By: Lanelle Bal 08/22/2009 08:38:22  _____________________________________________________________________  External Attachment:    Type:   Image     Comment:   External Document

## 2010-03-09 NOTE — Miscellaneous (Signed)
  Clinical Lists Changes  Problems: Added new problem of * NARCOLEPSY

## 2010-03-09 NOTE — Progress Notes (Signed)
  Phone Note Outgoing Call   Call placed by: Lemont Fillers FNP,  Jun 17, 2009 1:13 PM Call placed to: Specialist Summary of Call: [facility] Physician Referrals.  There are no dermatologists in system who participate in the [place] payment.  I did speak with the patient during her last visit and educated her that the only way to be 100% sure that the lesion we removed from her chest is not cancer is to perform a wider excision which we do not perform in this office.  She understands this.  Unfortunately, at this time she is unable to pay for her dermatology appoinment. Initial call taken by: Lemont Fillers FNP,  Jun 17, 2009 1:15 PM

## 2010-03-09 NOTE — Progress Notes (Signed)
Summary: ultrasound   Phone Note Outgoing Call   Summary of Call: Please call [name] and let her know that her ultrasound shows fatty liver.  We should work hard on her cholesterol.  Please have patient return for FLP. (272.4) Thanks Initial call taken by: Lemont Fillers FNP,  March 24, 2009 4:49 PM  Follow-up for Phone Call        spoke w/ patient aware of ultrasound results and also scheduled appt to recheck labs....Marland KitchenMarland KitchenDoristine Devoid  March 28, 2009 8:12 AM   New Problems: FATTY LIVER DISEASE (ICD-571.8)   New Problems: FATTY LIVER DISEASE (ICD-571.8)

## 2010-03-09 NOTE — Progress Notes (Signed)
  Phone Note Call from Patient   Caller: Patient Details for Reason: Dermatology referral Summary of Call: Call from pt:   Pt was scheduled to see Dr Clydia [facility]   May 5th   patient states she cannot afford to pay OV  and will call and reschedule herself   Initial call taken by: Darral Dash,  June 02, 2009 1:42 PM

## 2010-03-09 NOTE — Assessment & Plan Note (Signed)
Summary: Nausea,vomiting, dfs   History of Present Illness Visit Type: Initial Visit Primary GI MD: Yancey Flemings MD Primary Provider: Sandford Craze, FNP Chief Complaint: Patient started with vomiting and dehydration on needed to go to the ER for fluids. She has diarrhea until last night.  She was given GI cocktail which made her feel better. She seen Dr. Madilyn Fireman in the office July 2010 but they do not take her insurance any longer. She was seen in 2010 and dxed with gastroparesis. History of Present Illness:   [name] is a 35 year old female last seen here by   [name] in 1994 when she was 35 years old. At that time patient presented with intermittent hematochezia. A flexible sigmoidoscopy was unremarkable and bleeding felt to be secondary to anal fissure.   Three days ago patient went to ER for acute nausea, vomiting, diarrhea, diffuse abdominal pain,  fever and rash on her face and abdomen.  WBC mildly elevated but no other pertinent findings. She was sent home with Immodium, Hydrocodone and Zofran. Paitent saw PCP yesterday, given Lomotil and her diarrhea has significantly slowed down. It should be noted however, that she is only consuming liquids at this point. Her left sided abdominal pain has resolved.   Upon further questioning, the patient gives a one year history of frequent nausea. She doesn't have any chronic bloating or upper abdominal discomfort, just the nausea. In June 2010 patient was admitted for to the hospital for acute nausea, vomiting, diarrhea and epigastric pain. She was seen in consultation by Dr. Madilyn Fireman [facility]). Workup included an EGD, gastric emptying study, and HIDA scan. Given Reglan, caused lactation.   Has been on Aciphex since EGD last year. Denies history of GERD.   GI Review of Systems    Reports nausea.      Denies abdominal pain, acid reflux, belching, bloating, chest pain, dysphagia with liquids, dysphagia with solids, heartburn, loss of  appetite, vomiting, vomiting blood, weight loss, and  weight gain.      Reports constipation and  diarrhea.     Denies anal fissure, black tarry stools, change in bowel habit, diverticulosis, fecal incontinence, heme positive stool, hemorrhoids, irritable bowel syndrome, jaundice, light color stool, liver problems, rectal bleeding, and  rectal pain.    Current Medications (verified): 1)  Pristiq 100 Mg Xr24h-Tab (Desvenlafaxine Succinate) .... Take Two By Mouth At Bedtime 2)  Carbamazepine 200 Mg Tabs (Carbamazepine) .... Take 1 Tab By Mouth At Bedtime 3)  Klonopin 0.5 Mg Tabs (Clonazepam) .... Take 1/2 By Mouth At Bedtime and As Needed 4)  Zofran 4 Mg Tabs (Ondansetron Hcl) .... One Very 6 Hours As Needed 5)  Aciphex 20 Mg Tbec (Rabeprazole Sodium) .... One Tablet By Mouth Daily 6)  Fish Oil Concentrate 1000 Mg Caps (Omega-3 Fatty Acids) .... Two Times A Day 7)  Requip 3 Mg Tabs (Ropinirole Hcl) .Marland Kitchen.. 1 Tab Nightly 8)  Womens Multivitamin Plus  Tabs (Multiple Vitamins-Minerals) .... One Tablet By Mouth Daily 9)  Keppra Xr 750 Mg Xr24h-Tab (Levetiracetam) .... 2 Tablets By Mouth Daily At Bedtime (Waiting On Assistance) 10)  Simvastatin 10 Mg Tabs (Simvastatin) .... One Tab By Mouth Daily in The Evening 11)  Lomotil 2.5-0.025 Mg Tabs (Diphenoxylate-Atropine) .... One To Two Tablets By Mouth Every 6 Hours As Needed For Diarrhea  Allergies: 1)  ! Reglan 2)  ! * Iv Phenergan 3)  Penicillin  Past History:  Past Medical History: Sz d/o- follows w/ Dr Ovidio Kin depression/anxiety endometriosis-  resolved w/ hysterectomy GERD IBS Chronic Headaches Hyperlipidemia gastroparesis  Past Surgical History: Reviewed history from 12/24/2007 and no changes required. Hysterectomy - Partial - severe endometriosis Post appendectomy secondary to above hx of migraine headaches  Family History: Father: 69, Deceased, CHF, DM Mother: 23, MI, back problems 3 sisters: healthy - age 36s and 81s 2  children: allergies boy 56 and girl 7 no colon or breast CA maternal grandparents ? unknown type cancers Family History of Colon Polyps:mother Family History of Colitis: mother Aunt - ETOH cirrhosis  Social History: Married Former Smoker Alcohol use-no Drug use-no Lives with husband and kids, 2 (95, 02) Education: 12 th grade Occauption>: unemployed and considering disability. Daily Caffeine Use 4 per day  Review of Systems       The patient complains of anxiety-new, confusion, depression-new, fatigue, fever, headaches-new, and sleeping problems.  The patient denies allergy/sinus, anemia, arthritis/joint pain, back pain, blood in urine, breast changes/lumps, change in vision, cough, coughing up blood, fainting, hearing problems, heart murmur, heart rhythm changes, itching, menstrual pain, muscle pains/cramps, night sweats, nosebleeds, pregnancy symptoms, shortness of breath, skin rash, sore throat, swelling of feet/legs, swollen lymph glands, thirst - excessive , urination - excessive , urination changes/pain, urine leakage, vision changes, and voice change.    Vital Signs:  Patient profile:   35 year old female Menstrual status:  hysterectomy Height:      66.50 inches Weight:      198.4 pounds BMI:     31.66 Pulse rate:   82 / minute Pulse rhythm:   regular BP sitting:   110 / 62  (right arm) Cuff size:   regular  Vitals Entered By: Harlow Mares CMA Duncan Dull) (May 11, 2009 2:16 PM)  Physical Exam  General:  Well developed, well nourished, no acute distress. Head:  Normocephalic and atraumatic. Eyes:  Conjunctiva pink, no icterus.  Neck:  no obvious masses  Lungs:  Clear throughout to auscultation. Heart:  Regular rate and rhythm; no murmurs, rubs,  or bruits. Abdomen:  Abdomen soft, nontender, nondistended. No obvious masses or hepatomegaly.Normal bowel sounds.  Msk:  Symmetrical with no gross deformities. Normal posture. Extremities:  No palmar erythema, no  edema.  Neurologic:  Alert and  oriented x4;  grossly normal neurologically. Skin:  Intact without significant lesions or rashes. Cervical Nodes:  No significant cervical adenopathy. Psych:  Alert and cooperative. Normal mood and affect.   Impression & Recommendations:  Problem # 1:  GASTROENTERITIS (ICD-558.9) Assessment Improved Acute nausea, vomiting, diarrhea, abdominal pain, and fever consistent with gastroenteritis. Contrast CTscan abd / pelvis in ER was unrevealing (except for cystic lesion in right pelvis). CBC remarkable only for mild leukocytosis. Symptoms are resolving.   Problem # 2:  NAUSEA (ICD-787.02) Assessment: Comment Only One year history of frequent nausea without vomiting. Several possibilities here:  1.)  Rule of medication induced nausea (? seizure medications).  2.) Gastroparesis-  In 2010 an EGD and gastric emptying scan were compatible with gastroparesis. The patient however, was hospitalized at the time with symptoms of gastroenteritis and that, as well as any pain medications could have caused delayed gastric emptying.  3.) Biliary dyskinesia - gallbladder ejection fraction of only 11% (2010). Again, results must be taken into context (hospitalized with acute illness, ? pain medications). She was evaluated by surgery during that admission. Dr. Daphine Deutscher felt low ejection fraction may have been result of infectious process for which patient was admitted.   3.) PUD - Not highly suspicious but  should be excluded.  For further evaluation the patient will be scheduled for an EGD with biopsies ( if indicated).  The risks and benefits of the procedure, as well as alternatives were discussed with the patient and she agrees to proceed. If EGD unrevealing will consider repeat gastric emptying and / or repeat workup for biliary disease. For now, continue PPI. Patient cannot take Reglan (caused lactation). If nausea does turn out to be secondary to gastroparesis, Domperidone may  be an option for her.  Problem # 3:  Hx of GASTRITIS, HX OF (ICD-V12.79) Assessment: Comment Only EGD June 2010 [facility]) revealed gastric erythema and  inflammation with biopsies compatible with focal mucosal erosion / active gastritis (h.pylori negative)  Problem # 4:  LIVER FUNCTION TESTS, ABNORMAL, HX OF (ICD-V12.2) Assessment: Comment Only Chronic transaminitis (mild) and likely related to fatty liver (CTscan ) . Recent LFTs - AST of 48 and ALT of 54.  Need to rule out autoimmune hepatitis, chronic viral hepatitis, etc...  Orders: T-Ceruloplasmin ([id number]) T-Hepatitis B Surface Antibody ([id number]) T-Hepatitis B Surface Antigen (29562-13086) T-Hepatitis C Anti HCV (57846) EGD (EGD)  Problem # 5:  SEIZURE DISORDER (ICD-780.39) On treatment.  Problem # 6:  CONSTIPATION (ICD-564.00) Assessment: Comment Only Defined as small, hard balls of stools every 3-4 days. Currently, she is on Lomotil for recent diarrheal illness. I have asked her to start decreasing the dose to see if acute diarrhea is resolving and also to avoid exacerbating her chronic constipatiion. When acute illness has resolved patient will need bowel regimen, (fiber, Miralax).   Problem # 7:  NONSPECIFIC ABN FINDING RAD & OTH EXAM GU ORGAN (ICD-793.5) Assessment: Comment Only CTscan pelvis - large 6cm x 7 cm cystic lesion within right pelvis, likely associated with right ovary. PCP to order vaginal ultrasound.   Other Orders: T-Alpha-1-Antitrypsin Tot [phone]) T-AMA [phone]) T-ANA [phone]) T-Anti SMA ([id number]) TLB-IBC Pnl (Iron/FE;Transferrin) (83550-IBC) TLB-Ferritin (82728-FER)  Patient Instructions: 1)  Your physician has requested that you have the following labwork done today: Go to our basement level. 2)  We will call you with the results. 3)  We have schedueld the Endoscopy with [name] for 05-16-09 at 3:00 PM. 4)  [facility] Patient Information Guide given to  patient. 5)  Endoscopy brochure given. 6)  The medication list was reviewed and reconciled.  All changed / newly prescribed medications were explained.  A complete medication list was provided to the patient / caregiver.

## 2010-03-09 NOTE — Assessment & Plan Note (Signed)
Summary: 3 month follow up/mhf rsch per pt/dt   Vital Signs:  Patient profile:   35 year old female Menstrual status:  hysterectomy Weight:      190 pounds BMI:     30.32 O2 Sat:      99 % on Room air Temp:     98.2 degrees F oral Pulse rate:   87 / minute Pulse rhythm:   regular Resp:     16 per minute BP sitting:   122 / 80  (right arm) Cuff size:   large  Vitals Entered By: Glendell Docker CMA (October 28, 2009 2:36 PM)  O2 Flow:  Room air CC: 3 month follow up  Is Patient Diabetic? No Pain Assessment Patient in pain? no      Comments c/o throat irritation, no taste, for the past 2 days, pain in right ear     Last PAP Result Declined-Hyst   Primary Care Provider:  Sandford Craze, FNP  CC:  3 month follow up .  History of Present Illness: Pt complains of throat pain and right sided ear pain.  + sick contacts.  Denies N/V/D.  Has not taken any OTC meds.    Mole on chest-  saw a surgeon- did not feel that any further excision was necessary.  Narcolepsy-  Recently started on Nuvigil.  Notes improvement but it wears off after a few hours.    Depression-  Overall has been good- just feels tired.  Patient sees Dr Montenegro- psychiatry- has not received call back- called on Wednesday.  Needs refills, afraid of running out.  Epilepsy-  No known seizures.  Appetitite is poor-  only eating dinner.    Preventive Screening-Counseling & Management  Alcohol-Tobacco     Smoking Status: never  Allergies: 1)  ! Reglan 2)  ! * Iv Phenergan 3)  ! * Eggs 4)  Penicillin  Past History:  Past Medical History: Sz d/o- follows w/ Dr Dohmeir depression/anxiety endometriosis- resolved w/ hysterectomy GERD IBS Chronic Headaches Hyperlipidemia gastroparesis  Review of Systems       see HPI  Physical Exam  General:  Well-developed,well-nourished,in no acute distress; alert,appropriate and cooperative throughout examination Head:  Normocephalic and atraumatic  without obvious abnormalities. No apparent alopecia or balding. Ears:  External ear exam shows no significant lesions or deformities.  Otoscopic examination reveals clear canals, tympanic membranes are intact bilaterally without bulging, retraction, inflammation or discharge. Hearing is grossly normal bilaterally. Mouth:  Oral mucosa and oropharynx without lesions or exudates.  Teeth in good repair. Neck:  No deformities, masses, or tenderness noted. Lungs:  Normal respiratory effort, chest expands symmetrically. Lungs are clear to auscultation, no crackles or wheezes. Heart:  Normal rate and regular rhythm. S1 and S2 normal without gallop, murmur, click, rub or other extra sounds. Extremities:  No clubbing, cyanosis, edema, or deformity noted with normal full range of motion of all joints.     Impression & Recommendations:  Problem # 1:  IRON DEFICIENCY (ICD-280.9) Assessment Improved patient continues on Iron supplement.  F/u iron level is normal, continue supplement. Her updated medication list for this problem includes:    Ferrous Sulfate 325 (65 Fe) Mg Tabs (Ferrous sulfate) ..... One tablet by mouth daily  Orders: T-Iron (16109-60454) TLB-CBC Platelet - w/Differential (85025-CBCD)  Problem # 2:  MOLE (ICD-216.9) Assessment: Comment Only Patient was referred for wider excison of mole on her chest.  Apparently Derm did not feel that it was indicated, will try to obtain records.  Problem # 3:  * NARCOLEPSY Assessment: New Newly diagnosed- follows with Dr. Richardean Chimera.  Patient is seeing some improvement with Nuvigil  Problem # 4:  SEIZURE DISORDER (ICD-780.39) Assessment: Unchanged Per patient, she was having seizures in her sleep per Dr. Richardean Chimera who evaluated her sleep study.  She is continued on Keppra.  She has had no day time seizures. Her updated medication list for this problem includes:    Klonopin 0.5 Mg Tabs (Clonazepam) .Marland Kitchen... Take 1 by mouth at bedtime and as needed     Keppra Xr 750 Mg Xr24h-tab (Levetiracetam) ..... One tablet by mouth three times a day  Problem # 5:  FATTY LIVER DISEASE (ICD-571.8) Assessment: Unchanged Patient was reminded to keep a low cholesterol diet. Also, reminded her to try to eat 3 small, nutritious meals throughout the day.  She is currently only eating dinner.    Problem # 6:  UPPER RESPIRATORY INFECTION, VIRAL (ICD-465.9) Assessment: New Rapid strep negative.  Recommended supportive measures- patient to call if symptoms worsen or do not improve.  Her updated medication list for this problem includes:    Zyrtec Allergy 10 Mg Caps (Cetirizine hcl) ..... One tablet by mouth daily  Complete Medication List: 1)  Pristiq 100 Mg Xr24h-tab (Desvenlafaxine succinate) .... Take two by mouth at bedtime 2)  Klonopin 0.5 Mg Tabs (Clonazepam) .... Take 1 by mouth at bedtime and as needed 3)  Zofran 4 Mg Tabs (Ondansetron hcl) .... One very 6 hours as needed 4)  Omeprazole 20 Mg Tbec (Omeprazole) .... One tablet by mouth daily 5)  Fish Oil Concentrate 1000 Mg Caps (Omega-3 fatty acids) .... Two times a day 6)  Requip 3 Mg Tabs (Ropinirole hcl) .Marland Kitchen.. 1 tab nightly 7)  Womens Multivitamin Plus Tabs (Multiple vitamins-minerals) .... One tablet by mouth daily 8)  Keppra Xr 750 Mg Xr24h-tab (Levetiracetam) .... One tablet by mouth three times a day 9)  Simvastatin 10 Mg Tabs (Simvastatin) .... One tab by mouth daily in the evening 10)  Lomotil 2.5-0.025 Mg Tabs (Diphenoxylate-atropine) .... One to two tablets by mouth every 6 hours as needed for diarrhea 11)  Ferrous Sulfate 325 (65 Fe) Mg Tabs (Ferrous sulfate) .... One tablet by mouth daily 12)  Zyrtec Allergy 10 Mg Caps (Cetirizine hcl) .... One tablet by mouth daily 13)  Nuvigil 250 Mg Tabs (Armodafinil) .Marland Kitchen.. 1- 1 1/2 tablet by mouth  every morning  Other Orders: TLB-BMP (Basic Metabolic Panel-BMET) (80048-METABOL) TLB-Hepatic/Liver Function Pnl (80076-HEPATIC) Rapid Strep  (16109)  Patient Instructions: 1)  Try to eat 3 healthy meals a day.   2)  Follow up in 3 months. 3)  Have a nice fall! Prescriptions: PRISTIQ 100 MG XR24H-TAB (DESVENLAFAXINE SUCCINATE) take two by mouth at bedtime  #60 x 0   Entered and Authorized by:   Lemont Fillers FNP   Signed by:   Lemont Fillers FNP on 10/28/2009   Method used:   Electronically to        [facility]* (retail)       [address] [place], [place]  [address]       Ph: [phone]       Fax: [phone]   RxID:   [id number]   Current Allergies (reviewed today): ! REGLAN ! * IV PHENERGAN ! * EGGS PENICILLIN   Preventive Care Screening  Pap Smear:    Date:  10/28/2009  Results:  Declined-Hyst       Preventive Care Screening  Pap Smear:    Date:  10/28/2009    Results:  Declined-Hyst   Laboratory Results    Other Tests  Rapid Strep: negative

## 2010-03-09 NOTE — Progress Notes (Signed)
Summary: pt assistance status update--Keppra  Phone Note Outgoing Call   Summary of Call: Called [phone] for status update on pt. assistance with Keppra. Per [employer],  rx. must be signed by an MD.  Form reprinted for signature by MD. Corrected form faxed back @ 4:15pm. Initial call taken by: Mervin Kung CMA,  April 14, 2009 8:45 AM

## 2010-03-09 NOTE — Assessment & Plan Note (Signed)
Summary: 3 MONTH FU/DT--Rm 4   Vital Signs:  Patient profile:   35 year old female Menstrual status:  hysterectomy Height:      66.5 inches Weight:      191 pounds BMI:     30.48 Temp:     97.7 degrees F oral Pulse rate:   72 / minute Pulse rhythm:   regular Resp:     16 per minute BP sitting:   122 / 80  (right arm) Cuff size:   regular  Vitals Entered By: Mervin Kung CMA Duncan Dull) (January 27, 2010 11:18 AM) CC: Pt here for 3 month follow up. Is Patient Diabetic? No Pain Assessment Patient in pain? no      Comments Pt states she has not been taking her simvastatin. Pt is still awaiting approval for Nuvigil and is taking Adderall 30mg  1 once daily. Pt has completed Ceftin. Nicki Guadalajara Fergerson CMA Duncan Dull)  January 27, 2010 11:26 AM    Primary Care Provider:  Sandford Craze, FNP  CC:  Pt here for 3 month follow up.Marland Kitchen  History of Present Illness: [name] is a 35 year old female who presents today for routine follow up.    1) Sinusitus/OM- Treated last visit- she reports that this is resolved.   2) Seizures-  had one suspected seizure about 10 days ago- woke up with togue bite.  Husband did not notice her seizing during the night however.  She is followed by Dr. Johnnette Gourd.  3) Gastritis-  feels better with prilosec.  Staying off eggs which is helping.    4) Hyperlipidemia- has not take in " a little while"-  due to cost.  Plans to resume.  5) Depression/Anxiety-  Continues pristique-  Working on patient assistance.  Concentration remains poor.  Went to [facility] (mental health)  She is going to start some counseling groups.  Denies suicide ideation.    Allergies: 1)  ! Reglan 2)  ! * Iv Phenergan 3)  ! * Eggs 4)  Penicillin  Past History:  Past Medical History: Last updated: 10/28/2009 Sz d/o- follows w/ Dr Richardean Chimera depression/anxiety endometriosis- resolved w/ hysterectomy GERD IBS Chronic Headaches Hyperlipidemia gastroparesis  Past Surgical  History: Last updated: 12/24/2007 Hysterectomy - Partial - severe endometriosis Post appendectomy secondary to above hx of migraine headaches  Review of Systems       see HPI  Physical Exam  General:  Well-developed,well-nourished,in no acute distress; alert,appropriate and cooperative throughout examination Lungs:  Normal respiratory effort, chest expands symmetrically. Lungs are clear to auscultation, no crackles or wheezes. Heart:  Normal rate and regular rhythm. S1 and S2 normal without gallop, murmur, click, rub or other extra sounds. Psych:  Cognition and judgment appear intact. Alert and cooperative with normal attention span and concentration. No apparent delusions, illusions, hallucinations   Impression & Recommendations:  Problem # 1:  DEPRESSION/ANXIETY (ICD-300.4) Assessment Unchanged She is happy with current dose of Pristique.  She plans to enroll in group therapy which I have encouraged her to do.  Problem # 2:  SEIZURE DISORDER (ICD-780.39) Assessment: Unchanged This is being managed by Dr. Johnnette Gourd. Her updated medication list for this problem includes:    Klonopin 0.5 Mg Tabs (Clonazepam) .Marland Kitchen... Take 1 by mouth at bedtime and as needed    Keppra Xr 750 Mg Xr24h-tab (Levetiracetam) ..... One tablet by mouth three times a day  Complete Medication List: 1)  Pristiq 100 Mg Xr24h-tab (Desvenlafaxine succinate) .... Take two by mouth at bedtime 2)  Klonopin 0.5 Mg Tabs (Clonazepam) .... Take 1 by mouth at bedtime and as needed 3)  Zofran 4 Mg Tabs (Ondansetron hcl) .... One very 6 hours as needed 4)  Omeprazole 20 Mg Tbec (Omeprazole) .... One tablet by mouth daily 5)  Fish Oil Concentrate 1000 Mg Caps (Omega-3 fatty acids) .... Two times a day 6)  Requip 3 Mg Tabs (Ropinirole hcl) .Marland Kitchen.. 1 tab nightly 7)  Womens Multivitamin Plus Tabs (Multiple vitamins-minerals) .... One tablet by mouth daily 8)  Keppra Xr 750 Mg Xr24h-tab (Levetiracetam) .... One tablet by  mouth three times a day 9)  Simvastatin 10 Mg Tabs (Simvastatin) .... One tab by mouth daily in the evening 10)  Lomotil 2.5-0.025 Mg Tabs (Diphenoxylate-atropine) .... One to two tablets by mouth every 6 hours as needed for diarrhea 11)  Ferrous Sulfate 325 (65 Fe) Mg Tabs (Ferrous sulfate) .... One tablet by mouth daily 12)  Zyrtec Allergy 10 Mg Caps (Cetirizine hcl) .... One tablet by mouth daily 13)  Nuvigil 250 Mg Tabs (Armodafinil) .Marland Kitchen.. 1- 1 1/2 tablet by mouth  every morning 14)  Adderall 30 Mg Tabs (Amphetamine-dextroamphetamine) .... Take 1 tablet by mouth once a day.  Other Orders: Admin 1st Vaccine (47425) Flu Vaccine 35yrs + [phone])  Patient Instructions: 1)  Please follow up in 3 months- sooner if problems or concerns. 2)  Have a nice Holiday!   Orders Added: 1)  Admin 1st Vaccine [90471] 2)  Flu Vaccine 21yrs + [90658] 3)  Est. Patient Level II [75643]    Current Allergies (reviewed today): ! REGLAN ! * IV PHENERGAN ! * EGGS PENICILLIN  Flu Vaccine Consent Questions     Do you have a history of severe allergic reactions to this vaccine? no    Any prior history of allergic reactions to egg and/or gelatin? no    Do you have a sensitivity to the preservative Thimersol? no    Do you have a past history of Guillan-Barre Syndrome? no    Do you currently have an acute febrile illness? no    Have you ever had a severe reaction to latex? no    Vaccine information given and explained to patient? yes    Are you currently pregnant? no    Lot Number:[id number]   Exp Date:08/05/2010   Site Given  Left Deltoid IM.  Nicki Guadalajara Fergerson CMA Duncan Dull)  January 27, 2010 12:01 PM

## 2010-03-09 NOTE — Progress Notes (Signed)
Summary: headache Medication Status  Phone Note Call from Patient Call back at [phone]   Caller: Millie- Dr. Porfirio Mylar Doehmeir Office Summary of Call: voice message recieved from Millie-Dr Doehmeir  with [facility] stating she was returning a phone call to [facility] regarding this patient. She state patient is currently being treated for siezures and not headaches. The message also states that Dr Theresia Bough has not prescribes narcotics for this patient, and has no plans to. If there are any questions Aldean Jewett can be reached at [phone] Initial call taken by: Glendell Docker CMA,  May 13, 2009 3:49 PM  Follow-up for Phone Call        Please advise what to do for pt. for long term help with headaches?  I will call in #15 tabs of butalbital per our previous conversation.   Mervin Kung CMA  May 13, 2009 4:02 PM   Additional Follow-up for Phone Call Additional follow up Details #1::        Instead of butalbital, at this point, I would recommend aleve 220 mg by mouth every 12 hours for now.  I am happy to see her in the office to further address her headaches- this is not a problem that  I have seen the patient for previously. Additional Follow-up by: Lemont Fillers FNP,  May 13, 2009 4:13 PM    Additional Follow-up for Phone Call Additional follow up Details #2::    Notified pt. that [facility] stated they have not evaluated her for headaches and cannot prescribe anything at this point.  Advised pt. per Ayvin Lipinski's instructions.  Pt voices understanding and will discuss with Enez Monahan at next appt. on 05/17/09.  Mervin Kung CMA  May 13, 2009 4:27 PM

## 2010-03-09 NOTE — Progress Notes (Signed)
  Phone Note Other Incoming   Request: Send information Summary of Call: Request for records received from [employer]. Request forwarded to [employer].     

## 2010-03-09 NOTE — Progress Notes (Signed)
Summary: Keppra pt. assistance status  Phone Note Call from Patient   Summary of Call: Pt. called and stated she spoke with pt. assistance. co. for Keppra and they said they are awaiting fax signed by MD.  I advised pt. that Dr. Artist Pais has signed the form and it was refaxed on 04/14/09. I called [employer], per [facility]  they also need Dr. Olegario Messier DEA and License # as well. Info. completed and refaxed to [phone]. Initial call taken by: Mervin Kung CMA,  April 22, 2009 1:46 PM  Follow-up for Phone Call        I called [employer] at [phone] and spoke to [place]. She verified that they have received our latest fax from 3/18 and it is in processing.  They will have a determination in 3 - 5 business days. Mervin Kung CMA  April 26, 2009 4:52 PM   Spoke to [facility] @ 2:37pm and they informed me that although Dr. Artist Pais had signed the pt. assistance form his named must be written in the boxes at the top as well.  Correction made and form refaxed to company. Pt notified.   Mervin Kung CMA  May 02, 2009 2:37 PM   Additional Follow-up for Phone Call Additional follow up Details #1::        Per Revonda Standard @ [employer] they received corrected application and it is in processing.  She states that info. is complete and further corrections will not be needed.  Check back in 3-5 days for decision.  Mervin Kung CMA  May 05, 2009 11:59 AM     Additional Follow-up for Phone Call Additional follow up Details #2::    Per Sherilyn Cooter @ [employer], application has been approved.  We should  be receiving the medication in 48 hours.  Pt. notified.  Mervin Kung CMA  May 10, 2009 2:34 PM   Spoke to pt's husband and notified him that pt's Keppra XR had been delivered to the office and were ready to be picked up.  He states they will pick it up this afternoon.  Meds left at front desk for pt.  Mervin Kung CMA  May 11, 2009 3:30 PM

## 2010-03-09 NOTE — Progress Notes (Signed)
Summary: Migraine  Phone Note Call from Patient Call back at Home Phone [phone]   Caller: Spouse  Hanley Seamen Details for Reason: Migraine Summary of Call: Pt seen Friday for migraine,  please call husband    [phone] Initial call taken by: Darral Dash,  February 28, 2010 1:00 PM  Follow-up for Phone Call        Pt states her migraine eased up after taking the Maxalt but returned on Sunday night. States there are no changes in her symptoms. Please advise. Nicki Guadalajara Fergerson CMA Duncan Dull)  February 28, 2010 1:13 PM   Additional Follow-up for Phone Call Additional follow up Details #1::        OK to repeat maxalt. Additional Follow-up by: Lemont Fillers FNP,  February 28, 2010 2:09 PM    Additional Follow-up for Phone Call Additional follow up Details #2::    What quantity of Maxalt can I give pt? She is currently out as she was given #3 tablets on previous rx. Nicki Guadalajara Fergerson CMA Duncan Dull)  February 28, 2010 2:44 PM   Additional Follow-up for Phone Call Additional follow up Details #3:: Details for Additional Follow-up Action Taken: Spoke with patient's husband.  Pt is out of maxalt.  Medicaid is not active.  Will leave rx for maxalt 10mg  #10 at front desk along with a medical release for patient to sign and return.  Husband says that she was on a hormone patch which helped with her migraines previously which was given to her by Dr. Emelda Fear in [place] (OB-GYN). Additional Follow-up by: Lemont Fillers FNP,  February 28, 2010 3:07 PM

## 2010-03-09 NOTE — Progress Notes (Signed)
Summary: refill--requip  Phone Note Refill Request Message from:  Fax from [facility] on August 05, 2009 2:57 PM  Refills Requested: Medication #1:  REQUIP 3 MG TABS 1 tab nightly   Dosage confirmed as above?Dosage Confirmed   Supply Requested: 1 month   Last Refilled: 07/07/2009 Next Appointment Scheduled: 10/24/09--Jaquanna Ballentine Initial call taken by: Mervin Kung CMA (AAMA),  August 05, 2009 5:07 PM    Prescriptions: REQUIP 3 MG TABS (ROPINIROLE HCL) 1 tab nightly  #30 x 2   Entered by:   Mervin Kung CMA (AAMA)   Authorized by:   Lemont Fillers FNP   Signed by:   Mervin Kung CMA (AAMA) on 08/05/2009   Method used:   Electronically to        [facility]* (retail)       [address] [place], [place]  [address]       Ph: [phone]       Fax: [phone]   RxID:   [id number]

## 2010-03-09 NOTE — Letter (Signed)
Summary: Out of School  [place] at [facility]  [address]   [place], [place] [address]   Phone: [phone]  Fax: [phone]    March 14, 2009   Student:  [name]    To Whom It May Concern:   For Medical reasons, please excuse the above named student from school for the following dates:  Start:   March 14, 2009  End:    March 15, 2009  If you need additional information, please feel free to contact our office.   Sincerely,    Lemont Fillers FNP    ****This is a legal document and cannot be tampered with.  Schools are authorized to verify all information and to do so accordingly.

## 2010-03-09 NOTE — Procedures (Signed)
Summary: Upper Endoscopy  Patient: [name] Note: All result statuses are Final unless otherwise noted.  Tests: (1) Upper Endoscopy (EGD)   EGD Upper Endoscopy       DONE     [facility]     [address]     [place], [place]  [address]           ENDOSCOPY PROCEDURE REPORT           PATIENT:  Emily, [name]  MR#:  [id number]     BIRTHDATE:  May 27, 1975, 33 yrs. old  GENDER:  female           ENDOSCOPIST:  Wilhemina Bonito. Eda Keys, MD     Referred by:  Office           PROCEDURE DATE:  05/16/2009     PROCEDURE:  EGD, diagnostic     ASA CLASS:  Class II     INDICATIONS:  nausea and vomiting, abdominal pain           MEDICATIONS:   Fentanyl 50 mcg IV, Versed 7 mg IV     TOPICAL ANESTHETIC:  Exactacain Spray           DESCRIPTION OF PROCEDURE:   After the risks benefits and     alternatives of the procedure were thoroughly explained, informed     consent was obtained.  The LB GIF-H180 [id number] endoscope was     introduced through the mouth and advanced to the second portion of     the duodenum, without limitations.  The instrument was slowly     withdrawn as the mucosa was fully examined.     <<PROCEDUREIMAGES>>           The upper, middle, and distal third of the esophagus were     carefully inspected and no abnormalities were noted. The z-line     was well seen at the GEJ. The endoscope was pushed into the fundus     which was normal including a retroflexed view. The antrum,gastric     body, first and second part of the duodenum were unremarkable.     Retroflexed views revealed no abnormalities.    The scope was then     withdrawn from the patient and the procedure completed.           COMPLICATIONS:  None           ENDOSCOPIC IMPRESSION:     1) Normal EGD     2) Gerd     RECOMMENDATIONS:     1) continue current medications     2) MY OFFICE NURSE WILL ARRANGE FOR ADDITIONAL BLOOD WORK AND     OFFICE FOLLOW UP WITH DR Marina Goodell        ______________________________     Wilhemina Bonito. Eda Keys, MD           CC:  The Patient; Sherron Monday           n.     eSIGNED:   Wilhemina Bonito. Eda Keys at 05/16/2009 04:39 PM           Richarda Osmond, [id number]  Note: An exclamation mark (!) indicates a result that was not dispersed into the flowsheet. Document Creation Date: 05/16/2009 4:39 PM _______________________________________________________________________  (1) Order result status: Final Collection or observation date-time: 05/16/2009 16:33 Requested date-time:  Receipt date-time:  Reported date-time:  Referring Physician:   Ordering Physician: Fransico Setters [phone]) Specimen Source:  Source:  Launa Grill Order Number: [id number] Lab site:

## 2010-03-09 NOTE — Letter (Signed)
Summary: Out of School  [place] at [facility]  [address]   [place], [place] [address]   Phone: [phone]  Fax: [phone]    March 01, 2010   Student:  [name]    To Whom It May Concern:   For Medical reasons, please excuse the above named student from school for the following dates:  Start:   February 24, 2010  End:    March 02, 2010.  Student may return to school on 03/02/10.  If you need additional information, please feel free to contact our office.   Sincerely,    Mervin Kung CMA (AAMA)    ****This is a legal document and cannot be tampered with.  Schools are authorized to verify all information and to do so accordingly.

## 2010-03-09 NOTE — Progress Notes (Signed)
Summary: vomitting  Phone Note Call from Patient Call back at Home Phone [phone]   Caller: Patient Call For: [name] Reason for Call: Talk to Nurse Summary of Call: Patient states that she has been vomitting for the last two hours, wants to know what to do. Initial call taken by: Tawni Levy,  May 30, 2009 11:45 AM  Follow-up for Phone Call        Pt. says she had epigastric pain  and then vomiting that started  about 9:30 am.Used her Zofran which is starting to take effect. Is concerned that prior symptoms  started after eating eggs and she ate eggs and potato salad yesterday and when vomited this am potato salad was whole like she had just eaten it. Advised to use  clear liquids and call PCP if it persists and that I will discuss food not being digested with Dr.[name] but he is currently out of the office. Follow-up by: Teryl Lucy RN,  May 30, 2009 12:12 PM  Additional Follow-up for Phone Call Additional follow up Details #1::        has a hx gastroparesis. On meds that can slow gastric emptying. Eat smaller meals more frequently and take zofran as needed. No additional recommendations at this time Additional Follow-up by: Hilarie Fredrickson MD,  May 30, 2009 2:40 PM    Additional Follow-up for Phone Call Additional follow up Details #2::    Pt. ntfd. of DrPerry's orders. Follow-up by: Teryl Lucy RN,  May 30, 2009 3:00 PM

## 2010-03-09 NOTE — Assessment & Plan Note (Signed)
Summary: ALLERGIES?    Vital Signs:  Patient profile:   35 year old female Menstrual status:  hysterectomy Height:      66.5 inches Weight:      190 pounds BMI:     30.32 Temp:     97.8 degrees F oral Pulse rate:   72 / minute Pulse rhythm:   regular Resp:     16 per minute BP sitting:   110 / 78  (right arm) Cuff size:   regular  Vitals Entered By: Mervin Kung CMA (Jun 15, 2009 11:06 AM) CC: room 5  Pt states she has had head congestion, sinus drainage, cough and headache x 1 week. Feels dizzy today. Is Patient Diabetic? No Comments Pt states she has not been taking Fish Oil and has not picked up rx for Ferrous Sulfate.   Primary Care Provider:  Sandford Craze, FNP  CC:  room 5  Pt states she has had head congestion, sinus drainage, and cough and headache x 1 week. Feels dizzy today.[name]  History of Present Illness: [name] is a 35 year old female who presents with 8-9 day history of nasal congestion.  Initially started with nasal congestion/watery eyes.  Then developed worsening sinus pressure.  Tried generic dayquil/nyquil without relief.  Denies fever.  Nasal discharge is starting to turn yellow.  + sore throat.    Allergies: 1)  ! Reglan 2)  ! * Iv Phenergan 3)  ! * Eggs 4)  Penicillin  Physical Exam  General:  Well-developed,well-nourished,in no acute distress; alert,appropriate and cooperative throughout examination Head:  +maxillary tenderness to palpation Eyes:  PERRLA Mouth:  Oral mucosa and oropharynx without lesions or exudates.  Teeth in good repair. Lungs:  Normal respiratory effort, chest expands symmetrically. Lungs are clear to auscultation, no crackles or wheezes. Heart:  Normal rate and regular rhythm. S1 and S2 normal without gallop, murmur, click, rub or other extra sounds. Abdomen:  Bowel sounds positive,abdomen soft and non-tender without masses, organomegaly or hernias noted.   Impression & Recommendations:  Problem # 1:  SINUSITIS  (ICD-473.9)  Her updated medication list for this problem includes:    Doxycycline Hyclate 100 Mg Caps (Doxycycline hyclate) ..... One tablet by mouth two times a day x 10 days  Problem # 2:  GASTRITIS  - NODULAR PER EGD 2010 (ICD-535.50) Assessment: Improved samples of aciphex given lot [id number] exp 02 2012 #15 The following medications were removed from the medication list:    Pepcid 20 Mg Tabs (Famotidine) .[name]... Take 1 tablet by mouth once a day Her updated medication list for this problem includes:    Aciphex 20 Mg Tbec (Rabeprazole sodium) ..... One tablet by mouth daily  Complete Medication List: 1)  Pristiq 100 Mg Xr24h-tab (Desvenlafaxine succinate) .... Take two by mouth at bedtime 2)  Klonopin 0.5 Mg Tabs (Clonazepam) .... Take 1 by mouth at bedtime and as needed 3)  Zofran 4 Mg Tabs (Ondansetron hcl) .... One very 6 hours as needed 4)  Aciphex 20 Mg Tbec (Rabeprazole sodium) .... One tablet by mouth daily 5)  Fish Oil Concentrate 1000 Mg Caps (Omega-3 fatty acids) .... Two times a day 6)  Requip 3 Mg Tabs (Ropinirole hcl) .[name].. 1 tab nightly 7)  Womens Multivitamin Plus Tabs (Multiple vitamins-minerals) .... One tablet by mouth daily 8)  Keppra Xr 750 Mg Xr24h-tab (Levetiracetam) .... 2 tablets by mouth daily at bedtime (waiting on assistance) 9)  Simvastatin 10 Mg Tabs (Simvastatin) .... One tab by  mouth daily in the evening 10)  Lomotil 2.5-0.025 Mg Tabs (Diphenoxylate-atropine) .... One to two tablets by mouth every 6 hours as needed for diarrhea 11)  Ferrous Sulfate 325 (65 Fe) Mg Tabs (Ferrous sulfate) .... One tablet by mouth daily 12)  Doxycycline Hyclate 100 Mg Caps (Doxycycline hyclate) .... One tablet by mouth two times a day x 10 days 13)  Zyrtec Allergy 10 Mg Caps (Cetirizine hcl) .... One tablet by mouth daily  Patient Instructions: 1)   Call if you develop fever over 101, increasing sinus pressure, pain with eye movement, increased facial tenderness of swelling, or  if you develop visual changes. 2)  Please follow up in 1 month. Prescriptions: DOXYCYCLINE HYCLATE 100 MG CAPS (DOXYCYCLINE HYCLATE) one tablet by mouth two times a day x 10 days  #20 x 0   Entered and Authorized by:   Lemont Fillers FNP   Signed by:   Lemont Fillers FNP on 06/15/2009   Method used:   Electronically to        [facility] (retail)       [address]       [place], [place]  [address]       Ph: [phone]       Fax: [phone]   RxID:   [id number]   Current Allergies (reviewed today): ! REGLAN ! * IV PHENERGAN ! * EGGS PENICILLIN

## 2010-03-09 NOTE — Progress Notes (Signed)
Summary: pt. wants to talk to [facility]  Phone Note Call from Patient   Caller: Patient Summary of Call: Rosela Supak, Call pt. after 3:00 at this [phone] She requested that I give you this message. Thank you Initial call taken by: Michaelle Copas,  March 31, 2009 4:44 PM  Follow-up for Phone Call        Patient wanted to know if her husband could become a patient at our office.  I instructed her to have her husband call to schedule an appointment with either myself of Dr. Artist Pais Follow-up by: Lemont Fillers FNP,  April 01, 2009 5:02 PM

## 2010-03-09 NOTE — Assessment & Plan Note (Signed)
Summary: gastroenteritis   Vital Signs:  Patient profile:   35 year old female Menstrual status:  hysterectomy Height:      66.50 inches Weight:      197 pounds BMI:     31.43 O2 Sat:      97 % on Room air Temp:     100.9 degrees F oral Pulse rate:   114 / minute BP sitting:   124 / 82  (left arm) Cuff size:   regular  Vitals Entered By: Payton Spark CMA (May 07, 2009 11:45 AM)  O2 Flow:  Room air CC: Vomiting and diarrhea x 1 day.    Primary Care Provider:  Neena Rhymes MD  CC:  Vomiting and diarrhea x 1 day. Marland Kitchen  History of Present Illness: 35 yo WF presents for abdominal pain, vomitting and diarrhea that started last night.  She had gastritits a year ago, on Aciphex.  She has not been able to keep anything down.  She has a seizure d/o.  She did vomit after that.  She feels week today.  C/O feeling dizzy.  She diarrhea has been watery.  No sick contacts at home.  Denies eating anything that made her sick.    She had vomitting and diarrhea all night last night.    Current Medications (verified): 1)  Pristiq 100 Mg Xr24h-Tab (Desvenlafaxine Succinate) .... One Daily 2)  Butalbital-Apap-Caff-Cod 50-325-40-30 Mg Caps (Butalbital-Apap-Caff-Cod) .... One Two Times A Day As Per Dr. Gerilyn Pilgrim For Headache 3)  Carbamazepine 200 Mg Tabs (Carbamazepine) .... Take As Directed 4)  Klonopin 0.5 Mg Tabs (Clonazepam) .... One Two Times A Day As Needed 5)  Zofran 4 Mg Tabs (Ondansetron Hcl) .... One Very 6 Hours As Needed 6)  Aciphex 20 Mg Tbec (Rabeprazole Sodium) .... One Tablet By Mouth Daily 7)  Fish Oil Concentrate 1000 Mg Caps (Omega-3 Fatty Acids) .... Two Times A Day 8)  Requip 3 Mg Tabs (Ropinirole Hcl) .Marland Kitchen.. 1 Tab Nightly 9)  Womens Multivitamin Plus  Tabs (Multiple Vitamins-Minerals) .... One Tablet By Mouth Daily 10)  Keppra Xr 750 Mg Xr24h-Tab (Levetiracetam) .... 2 Tablets By Mouth Daily At Bedtime 11)  Simvastatin 10 Mg Tabs (Simvastatin) .... One Tab By Mouth Daily in  The Evening  Allergies (verified): 1)  ! Reglan 2)  Penicillin  Past History:  Past Medical History: Reviewed history from 11/24/2008 and no changes required. Sz d/o- follows w/ Dr Ovidio Kin depression/anxiety endometriosis- resolved w/ hysterectomy GERD IBS  Past Surgical History: Reviewed history from 12/24/2007 and no changes required. Hysterectomy - Partial - severe endometriosis Post appendectomy secondary to above hx of migraine headaches  Social History: Reviewed history from 11/24/2008 and no changes required. Married Former Smoker Alcohol use-no Drug use-no Lives with husband and kids, 2 (95, 02) Education: 12 th grade Occauption>: unemployed and considering disability.  Review of Systems      See HPI  Physical Exam  General:  alert and overweight-appearing.  here with husband Head:  normocephalic and atraumatic.   Eyes:  scleara non icteric Nose:  no nasal discharge.   Mouth:  o/p pink, mildlly dry Neck:  no masses.   Lungs:  Normal respiratory effort, chest expands symmetrically. Lungs are clear to auscultation, no crackles or wheezes. Heart:  tachycardic, no mumurs Abdomen:  mildly distended with epigastric TTP.  No HSM.  hypoactive BS.   Extremities:  no Le edema Skin:  skin warm and dry with lacey rash over abdomen Cervical Nodes:  No  lymphadenopathy noted Psych:  flat affect.     Impression & Recommendations:  Problem # 1:  GASTROENTERITIS, ACUTE (ICD-558.9) With signs of dehydration - dry mouth, tachycardia, complicated but underlying seizure d/o. Sent to [facility] ED for further eval/ tx and IV fluids now. Her updated medication list for this problem includes:    Zofran 4 Mg Tabs (Ondansetron hcl) ..... One very 6 hours as needed  Complete Medication List: 1)  Pristiq 100 Mg Xr24h-tab (Desvenlafaxine succinate) .... One daily 2)  Butalbital-apap-caff-cod 50-325-40-30 Mg Caps (Butalbital-apap-caff-cod) .... One two times a day as per dr. Gerilyn Pilgrim  for headache 3)  Carbamazepine 200 Mg Tabs (Carbamazepine) .... Take as directed 4)  Klonopin 0.5 Mg Tabs (Clonazepam) .... One two times a day as needed 5)  Zofran 4 Mg Tabs (Ondansetron hcl) .... One very 6 hours as needed 6)  Aciphex 20 Mg Tbec (Rabeprazole sodium) .... One tablet by mouth daily 7)  Fish Oil Concentrate 1000 Mg Caps (Omega-3 fatty acids) .... Two times a day 8)  Requip 3 Mg Tabs (Ropinirole hcl) .Marland Kitchen.. 1 tab nightly 9)  Womens Multivitamin Plus Tabs (Multiple vitamins-minerals) .... One tablet by mouth daily 10)  Keppra Xr 750 Mg Xr24h-tab (Levetiracetam) .... 2 tablets by mouth daily at bedtime 11)  Simvastatin 10 Mg Tabs (Simvastatin) .... One tab by mouth daily in the evening

## 2010-03-09 NOTE — Progress Notes (Signed)
Summary: Sleep Speciailst referal  Phone Note Outgoing Call   Call placed by: Sonny Dandy,  November 12, 2006 3:13 PM Summary of Call: appointment set for with Dr Shelle Iron 11/18/06 at 1045am, records faxed and message left for patient to return call .................................................................Marland KitchenMarland KitchenSonny Dandy  November 12, 2006 3:14 PM   called, no answer, referral & lab letter mailed .................................................................Marland KitchenMarland KitchenSonny Dandy  November 13, 2006 9:25 AM  Initial call taken by: Sonny Dandy,  November 13, 2006 9:28 AM

## 2010-03-09 NOTE — Assessment & Plan Note (Signed)
Summary: f/u liver tests   History of Present Illness Visit Type: Follow-up Visit Primary GI MD: Yancey Flemings MD Primary Provider: Sandford Craze, FNP Requesting Provider: n/a Chief Complaint: F/u for labs and ENDO. Pt c/o still vomiting  History of Present Illness:   35 year old white female with obesity, hyperlipidemia, seizure disorder, anxiety/depression, gastroparesis, GERD, IBS, and chronic headaches. She presents today for followup. She is accompanied by her husband. The patient was seen in the office by the nurse practitioner on May 11, 2009 regarding nausea/vomiting, diarrhea, and abdominal pain. Also, chronic elevation of hepatic transaminases. She has had previous GI evaluation elsewhere as noted. She was felt to head gastroenteritis with possible exacerbation of gastroparesis. She was sent to me for diagnostic upper endoscopy. This was performed on May 16, 2009 and found to be normal. She has continued on AcipHex for GERD. She obtains samples from her primary provider to assure compliance. Since her endoscopy, she has had nausea and vomiting on 2 occasions only. This after eating eggs. She is not needing her Zofran. No further problems with diarrhea. Actually, some constipation. In terms of her liver tests, she has had chronic mild elevation of transaminases generally less than 2 times the upper limit of normal. Iron studies and viral hepatitis studies have been negative. She underwent a multitude of blood work to evaluate for other nonviral causes for elevated liver tests. Her they was normal except for an elevated ANA. Initially, 02-1278 with speckled pattern. However, this was repeated and found to be 1-160 with homogeneous pattern. She has a history of autoimmune disease. Liver ultrasound reveals changes consistent with fatty liver. No new complaints her other issues.Marland Kitchen   GI Review of Systems    Reports acid reflux and  vomiting.      Denies abdominal pain, belching, bloating, chest  pain, dysphagia with liquids, dysphagia with solids, heartburn, loss of appetite, nausea, vomiting blood, weight loss, and  weight gain.      Reports constipation.     Denies anal fissure, black tarry stools, change in bowel habit, diarrhea, diverticulosis, fecal incontinence, heme positive stool, hemorrhoids, irritable bowel syndrome, jaundice, light color stool, liver problems, rectal bleeding, and  rectal pain.    Current Medications (verified): 1)  Pristiq 100 Mg Xr24h-Tab (Desvenlafaxine Succinate) .... Take Two By Mouth At Bedtime 2)  Klonopin 0.5 Mg Tabs (Clonazepam) .... Take 1 By Mouth At Bedtime and As Needed 3)  Zofran 4 Mg Tabs (Ondansetron Hcl) .... One Very 6 Hours As Needed 4)  Aciphex 20 Mg Tbec (Rabeprazole Sodium) .... One Tablet By Mouth Daily 5)  Fish Oil Concentrate 1000 Mg Caps (Omega-3 Fatty Acids) .... Two Times A Day 6)  Requip 3 Mg Tabs (Ropinirole Hcl) .Marland Kitchen.. 1 Tab Nightly 7)  Womens Multivitamin Plus  Tabs (Multiple Vitamins-Minerals) .... One Tablet By Mouth Daily 8)  Keppra Xr 750 Mg Xr24h-Tab (Levetiracetam) .... 2 Tablets By Mouth Daily At Bedtime (Waiting On Assistance) 9)  Simvastatin 10 Mg Tabs (Simvastatin) .... One Tab By Mouth Daily in The Evening 10)  Lomotil 2.5-0.025 Mg Tabs (Diphenoxylate-Atropine) .... One To Two Tablets By Mouth Every 6 Hours As Needed For Diarrhea 11)  Ferrous Sulfate 325 (65 Fe) Mg Tabs (Ferrous Sulfate) .... One Tablet By Mouth Daily 12)  Doxycycline Hyclate 100 Mg Caps (Doxycycline Hyclate) .... One Tablet By Mouth Two Times A Day X 10 Days 13)  Zyrtec Allergy 10 Mg Caps (Cetirizine Hcl) .... One Tablet By Mouth Daily  Allergies (verified): 1)  !  Reglan 2)  ! * Iv Phenergan 3)  ! * Eggs 4)  Penicillin  Past History:  Past Medical History: Reviewed history from 05/11/2009 and no changes required. Sz d/o- follows w/ Dr Ovidio Kin depression/anxiety endometriosis- resolved w/ hysterectomy GERD IBS Chronic  Headaches Hyperlipidemia gastroparesis  Past Surgical History: Reviewed history from 12/24/2007 and no changes required. Hysterectomy - Partial - severe endometriosis Post appendectomy secondary to above hx of migraine headaches  Family History: Reviewed history from 05/11/2009 and no changes required. Father: 3, Deceased, CHF, DM Mother: 17, MI, back problems 3 sisters: healthy - age 58s and 58s 2 children: allergies boy 33 and girl 7 no colon or breast CA maternal grandparents ? unknown type cancers Family History of Colon Polyps:mother Family History of Colitis: mother Aunt - ETOH cirrhosis  Social History: Reviewed history from 05/11/2009 and no changes required. Married Former Smoker Alcohol use-no Drug use-no Lives with husband and kids, 2 (95, 02) Education: 12 th grade Occauption>: unemployed and considering disability. Daily Caffeine Use 4 per day  Review of Systems  The patient denies allergy/sinus, anemia, anxiety-new, arthritis/joint pain, back pain, blood in urine, breast changes/lumps, change in vision, confusion, cough, coughing up blood, depression-new, fainting, fatigue, fever, headaches-new, hearing problems, heart murmur, heart rhythm changes, itching, menstrual pain, muscle pains/cramps, night sweats, nosebleeds, pregnancy symptoms, shortness of breath, skin rash, sleeping problems, sore throat, swelling of feet/legs, swollen lymph glands, thirst - excessive , urination - excessive , urination changes/pain, urine leakage, vision changes, and voice change.    Vital Signs:  Patient profile:   35 year old female Menstrual status:  hysterectomy Height:      66.5 inches Weight:      193 pounds BMI:     30.80 BSA:     1.98 Pulse rate:   88 / minute Pulse rhythm:   regular BP sitting:   120 / 76  (left arm) Cuff size:   regular  Vitals Entered By: Ok Anis CMA (Jun 22, 2009 9:53 AM)  Physical Exam  General:  Well developed, well nourished, no  acute distress. Head:  Normocephalic and atraumatic. Eyes:  PERRLA, no icterus. Ears:  Normal auditory acuity. Nose:  No deformity, discharge,  or lesions. Mouth:  No deformity or lesions, dentition normal. Neck:  Supple; no masses or thyromegaly. Lungs:  Clear throughout to auscultation. Heart:  Regular rate and rhythm; no murmurs, rubs,  or bruits. Abdomen:  Soft,obese, nontender and nondistended. No masses, hepatosplenomegaly or hernias noted. Normal bowel sounds. No succussion splash. Msk:  unremarkable Pulses:  Normal pulses noted. Extremities:  No clubbing, cyanosis, edema or deformities noted. Neurologic:  Alert and  oriented x4;  grossly normal neurologically. Skin:  Intact without significant lesions or rashes. Cervical Nodes:  No significant cervical adenopathy.no supraclavicular adenopathy Psych:  Alert and cooperative. Normal mood and affect.   Impression & Recommendations:  Problem # 1:  FATTY LIVER DISEASE (ICD-571.8) This is the most likely explanation for her chronic mild elevation of hepatic transaminases. I doubt that she has autoimmune hepatitis. We did discuss the 2 conditions in detail. We also discussed that biopsy would be needed to differentiate. She is not interested in a biopsy. We discussed the potential ramifications of fatty liver disease, particularly NASH. I made them aware that, over time, this could progress to be reversible cirrhosis.  Plan: #1. Gradual sustained weight loss toward ideal body weight #2. Gradual sensible and regular exercise #3. Literature on fatty liver disease provided #4. Repeat liver tests  in 6 months. Could be done here or with primary provider. #5. Routine GI followup in one year  Problem # 2:  NAUSEA AND VOMITING (ICD-787.01) Resolved. Initially, seems to have had a gastroenteritis. Per history of gastroparesis, though no clinically significant symptoms at present to suggest an ongoing problem. Nothing further at this time to be  recommended  Problem # 3:  GERD (ICD-530.81) controlled with PPI therapy.. Negative recent endoscopy.  Plan: #1. Reflux precautions with attention to weight loss #2. PPI on demand to control symptoms  Problem # 4:  ANA POSITIVE (ICD-790.99) repeat serologic testing with less concerning lower titer homogenous pattern. Will leave further workup, if any, to primary provider, who is aware.  Patient Instructions: 1)  Please schedule a follow-up appointment in 1 year. 2)  Fatty Liver handout given.  3)  The medication list was reviewed and reconciled.  All changed / newly prescribed medications were explained.  A complete medication list was provided to the patient / caregiver. 4)  printed and given to pt.  Milford Cage NCMA  Jun 22, 2009 10:36 AM 5)  copy: Sandford Craze NPH

## 2010-03-09 NOTE — Progress Notes (Signed)
Summary: Wants GI referral ASAP please  Phone Note Other Incoming   Caller: Hanley Seamen, husband  [phone] Summary of Call: Pt's husband states she was seen this weekend and they are at the ER now and has been diagnosed with a gastrointestinal virus?  Dr. told them they need to see a GI dr soon. Pt. states the ER wants to refer her to Dr. Madilyn Fireman but he does not participate with her pt. assistance program through [facility]. States [facility] told her to contact her physician for a referral to another GI group.  Can we refer them to someone else? States she can see any [facility] doctor through her pt. assistance program.  Please advise?  Mervin Kung CMA  May 10, 2009 9:28 AM   Follow-up for Phone Call        I spoke to Dr. Clarene Duke the ER physician who is caring for this patient this AM.  She feels that patient's symptoms are due to acute gastroenteritis and does not feel that GI would have a lot to offer at this point.  I am happy to see her in the office tomorrow if she is not feeling better and we can discuss further.   Follow-up by: Lemont Fillers FNP,  May 10, 2009 9:53 AM  Additional Follow-up for Phone Call Additional follow up Details #1::        Spoke to pt's husband and notified him of above instructions per [facility].  He requested appt. with Briani Maul today.  Appt.  scheduled for today at 2:15 with Josefa Syracuse.  Called back and left message on pt. voicemail that appt. will be at [facility] office.  Mervin Kung CMA  May 10, 2009 11:27 AM

## 2010-03-09 NOTE — Assessment & Plan Note (Signed)
Summary: nausea, not sleeping/alr   Vital Signs:  Patient profile:   35 year old female Menstrual status:  hysterectomy Weight:      197.50 pounds Pulse rate:   70 / minute BP sitting:   110 / 80  Vitals Entered By: Kandice Hams (February 07, 2009 2:48 PM) CC: C/O FEELING NAUSEATED NOT SLEEPING WELL, OUT OF NEXIUM   Primary Care Provider:  Franchot Heidelberg, MD  CC:  C/O FEELING NAUSEATED NOT SLEEPING WELL and OUT OF NEXIUM.  History of Present Illness: Patient presents today with c/o insomnia (x 5 days) and nausea which started sunday after church.  Notes that her last dose of keppra was christmas eve.  Ran out and Dr. Gerilyn Pilgrim (neurology in [place]) did not have any keppra samples.  Patient tells me that Dr. Gerilyn Pilgrim told her that there was nothing else that she could take and sent her home.  Patient also tells me that she tried to apply for Keppra assistance but did not qualify.    Allergies: 1)  ! Reglan 2)  Penicillin  Review of Systems       + nausea, +insomnia  Physical Exam  General:  Well-developed,well-nourished,in no acute distress; alert,appropriate and cooperative throughout examination Neck:  thick neck Lungs:  Normal respiratory effort, chest expands symmetrically. Lungs are clear to auscultation, no crackles or wheezes. Heart:  Normal rate and regular rhythm. S1 and S2 normal without gallop, murmur, click, rub or other extra sounds. Abdomen:  Bowel sounds positive,abdomen soft and non-tender without masses, organomegaly or hernias noted. Neurologic:  alert & oriented X3.     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  Hesistant to add benzo for sleep due to seizure risk.  Hopefully these symptoms will continue to improve.    Problem # 2:  SEIZURE DISORDER (ICD-780.39) Assessment: Unchanged Cost is an issue.  Patient cannot afford  Keppra or dilantin- will try to bridge her with carbamazepin which is on the [facility] plan.  Patient is requesting a new neurologist.  Will refer.  Will check baseline labs.   Social work referral also requested. Her updated medication list for this problem includes:    Carbamazepine 200 Mg Tabs (Carbamazepine) .Marland Kitchen... Take as directed    Klonopin 0.5 Mg Tabs (Clonazepam) ..... One two times a day as needed  Orders: Neurology Referral (Neuro) TLB-BMP (Basic Metabolic Panel-BMET) (80048-METABOL) TLB-Hepatic/Liver Function Pnl (80076-HEPATIC) TLB-Iron, (Fe) Total (83540-FE) TLB-CBC Platelet - w/Differential (85025-CBCD) TLB-Udip ONLY (81003-UDIP) Social Work Referral (Social )  Problem # 3:  GERD (ICD-530.81) Assessment: Unchanged Samples given Her updated medication list for this problem includes:    Aciphex 20 Mg Tbec (Rabeprazole sodium) ..... One tablet by mouth daily  Complete Medication List: 1)  Pristiq 100 Mg Xr24h-tab (Desvenlafaxine succinate) .... One daily 2)  Butalbital-apap-caff-cod 50-325-40-30 Mg Caps (Butalbital-apap-caff-cod) .... One two times a day as per dr. Gerilyn Pilgrim for headache 3)  Carbamazepine 200 Mg Tabs (Carbamazepine) .... Take as directed 4)  Klonopin 0.5 Mg Tabs (Clonazepam) .... One two times a day as needed 5)  Zofran 4 Mg Tabs (Ondansetron hcl) .... One very 6 hours as needed 6)  Aciphex 20 Mg Tbec (Rabeprazole sodium) .... One tablet by mouth daily 7)  Fish Oil Concentrate 1000 Mg Caps (Omega-3 fatty acids) .... Two times a day 8)  Requip 3 Mg Tabs (Ropinirole hcl) .Marland Kitchen.. 1 tab nightly 9)  Womens Multivitamin Plus Tabs (Multiple vitamins-minerals) .... One tablet by mouth daily  Patient Instructions: 1)  Please start carbamazepine- one tablet by mouth twice daily x 1 week, then 2 tabs in AM 1 tab in PM x 1 week, the 2 tabs in AM and 2 tabs in PM x 1 week.   2)  You will be contacted about your appointment with neurology and social work 3)  Please schedule  a follow-up appointment in 1 month. Prescriptions: CARBAMAZEPINE 200 MG TABS (CARBAMAZEPINE) take as directed  #120 x 0   Entered and Authorized by:   Lemont Fillers FNP   Signed by:   Lemont Fillers FNP on 02/07/2009   Method used:   Electronically to        [facility] (retail)       [address]       [place], [place]  [address]       Ph: [phone]       Fax: [phone]   RxID:   [id number]   Laboratory Results   Urine Tests    Routine Urinalysis   Color: lt. yellow Appearance: Clear Glucose: negative   (Normal Range: Negative) Bilirubin: negative   (Normal Range: Negative) Ketone: negative   (Normal Range: Negative) Spec. Gravity: 1.015   (Normal Range: 1.003-1.035) Blood: negative   (Normal Range: Negative) pH: 5.0   (Normal Range: 5.0-8.0) Protein: trace   (Normal Range: Negative) Urobilinogen: 0.2   (Normal Range: 0-1) Nitrite: negative   (Normal Range: Negative) Leukocyte Esterace: negative   (Normal Range: Negative)

## 2010-03-09 NOTE — Progress Notes (Signed)
Summary: needs a note and samples left at [facility]  Phone Note Call from Patient   Caller: Patient Summary of Call: Needs a Dr. excuse for school.... She was out of school on Monday 03/28/09 and returned to school on 03/29/09... Would like to pick up at [facility] location. She also states that she would like to have some acid reflux samples as her and [name] discussed this problem. Call pt. back at [phone] and let her know staus on this. She will pick up at [facility] location as soon as you call her and let her know its ready for pick up. Initial call taken by: Michaelle Copas,  March 30, 2009 9:14 AM  Follow-up for Phone Call        Left message on machine to return call. Letter and samples are at the front desk @ [place] office. Follow-up by: Mervin Kung CMA,  March 30, 2009 9:58 AM  Additional Follow-up for Phone Call Additional follow up Details #1::        Rx Called In    Additional Follow-up for Phone Call Additional follow up Details #2::    informed pt. she can pick up note & samples at [facility]  Follow-up by: Michaelle Copas,  March 30, 2009 4:25 PM

## 2010-03-09 NOTE — Letter (Signed)
   [place] at [facility] [address] [place], [place]  [address]  [place] Phone: [phone]      April 13, 2009   [facility] [address] [address] [place], [place] [address]  RE:  LAB RESULTS  Dear  [name],  The following is an interpretation of your most recent lab tests.  Please take note of any instructions provided or changes to medications that have resulted from your lab work.  LIVER FUNCTION TESTS:  Stable - no changes needed      Sincerely Yours,    Lemont Fillers FNP

## 2010-03-09 NOTE — Progress Notes (Signed)
Summary: Medical Records Request  Phone Note Other Incoming Call back at [phone]   Caller: Angie Summary of Call: Angie from [facility] called stating they recieved a  medical records release for patient. She stated patient has been seeing them since 2006 and her chart is very "hefty". Angie would like to know if there are specific records that are being requested, or if the whole chart is needed.  Initial call taken by: Glendell Docker CMA,  March 02, 2010 9:40 AM  Follow-up for Phone Call        Last 2 years please + medication records. Follow-up by: Lemont Fillers FNP,  March 02, 2010 11:07 AM  Additional Follow-up for Phone Call Additional follow up Details #1::        call returned to Angie with medical records, she has been informed per Sandford Craze instructions Additional Follow-up by: Glendell Docker CMA,  March 02, 2010 12:01 PM

## 2010-03-09 NOTE — Progress Notes (Signed)
Summary: records received [facility]  Phone Note Other Incoming   Summary of Call: Received records from [facility], Dr Emelda Fear.  Initial call taken by: Mervin Kung CMA (AAMA),  March 03, 2010 1:42 PM

## 2010-03-09 NOTE — Progress Notes (Signed)
Summary: Pt assistance request, forms requested  Phone Note Call from Patient Call back at [phone] after 3pm. Can also leave msg with husband at hm #.   Caller: Patient Call For: Lemont Fillers FNP Summary of Call: Received voice message from pt requesting our help initiating Keppra patient assistance as we did in the past. Is this ok to initiate or does pt need to go through her specialist? Pt states she has trouble getting help from Dr Garey Ham office. Nicki Guadalajara Fergerson CMA Duncan Dull)  January 05, 2010 11:14 AM   Follow-up for Phone Call        We can help her with this. Lemont Fillers FNP,  January 05, 2010 11:35 AM  Spoke to [place] at [facility] [phone] and requested pt assistance forms. Nicki Guadalajara Fergerson CMA Duncan Dull)  January 05, 2010 12:07 PM   Additional Follow-up for Phone Call Additional follow up Details #1::        Left detailed msg on home phone that process has been initiated. Forms received, completed and forwarded to Dr Artist Pais for signature. Nicki Guadalajara Fergerson CMA Duncan Dull)  January 05, 2010 1:56 PM     Additional Follow-up for Phone Call Additional follow up Details #2::    Left message on cell # that form has been signed by Provider. Need to know when pt can come by and complete her portion of the paperwork and records release.   Nicki Guadalajara Fergerson CMA Duncan Dull)  January 18, 2010 1:35 PM  Left message for pt to call and let us know when she can stop by and complete her portion of the medication assistance form. Nicki Guadalajara Fergerson CMA Duncan Dull)  January 19, 2010 2:09 PM    Spoke to pt. and she states she will come by around 2pm on Monday to sign forms. Nicki Guadalajara Fergerson CMA Duncan Dull)  January 20, 2010 4:32 PM   Additional Follow-up for Phone Call Additional follow up Details #3:: Details for Additional Follow-up Action Taken: Pt left message requesting to have forms picked up for her to complete. Called pt back and advised her she will need to complete them in the office. Pt states  she will come by tomorrow to complete them. Nicki Guadalajara Fergerson CMA Duncan Dull)  January 23, 2010 4:42 PM   Pt signed forms at appt today and states she will get proof of income to [place] to mail with the forms. Nicki Guadalajara Fergerson CMA Duncan Dull)  January 27, 2010 4:48 PM   Left detailed message on home phone to call me with status of proof of income. Can't submit pt assistance forms without it.  Nicki Guadalajara Fergerson CMA Duncan Dull)  February 09, 2010 9:33 AM   Pt. returned my call and states that her neurologist was able to get Keppra approved and she will not need to go forward with the patient assistance. Forms shredded. Nicki Guadalajara Fergerson CMA Duncan Dull)  February 09, 2010 11:37 AM

## 2010-03-09 NOTE — Letter (Signed)
Summary: Out of School  [place] at [facility]  [address] [place], [place] [address]   Phone: [phone]  Fax: [phone]    March 30, 2009    Student:  [name]    To Whom It May Concern:  For Medical reasons, please excuse the above named student from school for the following dates:  Start:   March 28, 2009 End:    March 29, 2009  If you need additional information, please feel free to contact our office.   Sincerely,    Mervin Kung CMA Sandford Craze, NP    ****This is a legal document and cannot be tampered with.  Schools are authorized to verify all information and to do so accordingly.

## 2010-03-09 NOTE — Letter (Signed)
   [place] at [facility] [address] [place], [place]  [address]  [place] Phone: [phone]      February 08, 2009   [facility] [address] [place], [place] [address]  RE:  LAB RESULTS  Dear  [name],  The following is an interpretation of your most recent lab tests.  Please take note of any instructions provided or changes to medications that have resulted from your lab work.  ELECTROLYTES:  Good - no changes needed  KIDNEY FUNCTION TESTS:  Good - no changes needed  LIVER FUNCTION TESTS:  Abnormal - schedule a follow-up appointment    CBC:  Good - no changes needed  Please keep your follow up appointment next month as scheduled.  We will need to repeat your blood work at that visit.   Sincerely Yours,    Lemont Fillers FNP

## 2010-03-09 NOTE — Letter (Signed)
Summary: [facility]  [facility]   Imported By: Lanelle Bal 09/26/2009 13:46:30  _____________________________________________________________________  External Attachment:    Type:   Image     Comment:   External Document

## 2010-03-09 NOTE — Medication Information (Signed)
Summary: Patient Assistance Form/[facility]  Patient Assistance Form/[facility]   Imported By: Lanelle Bal 04/04/2009 10:54:58  _____________________________________________________________________  External Attachment:    Type:   Image     Comment:   External Document

## 2010-03-09 NOTE — Progress Notes (Signed)
Summary: Vivelle Dot  Phone Note Call from Patient Call back at Home Phone [phone]   Caller: Patient Call For: Lemont Fillers FNP Summary of Call: Pt signed records release to Dr Emelda Fear, previous OB/GYN. She states he used to prescribe Vivelle Dot for her migraines in the past. Records Release has been faxed. Nicki Guadalajara Fergerson CMA Duncan Dull)  March 02, 2010 8:27 AM

## 2010-03-09 NOTE — Letter (Signed)
Summary: Out of School  [place] [facility]  [address]   [place], [place] [address]   Phone: [phone]  Fax: [phone]    May 10, 2009   Student:  [name]    To Whom It May Concern:   For Medical reasons, please excuse the above named student from school for the following dates:  Start:   May 10, 2009  End:    May 12, 2009  If you need additional information, please feel free to contact our office.   Sincerely,    Lemont Fillers FNP    ****This is a legal document and cannot be tampered with.  Schools are authorized to verify all information and to do so accordingly.

## 2010-03-09 NOTE — Progress Notes (Signed)
  Phone Note Call from Patient   Caller: Patient Summary of Call: pt called c/o feeling nauseated, not sleeping at night, have been out of Keppra 3 weeks med is expensive $350 for month supply cannot afford   rx is precribed by her neuro, informed pt need to inform neuro about this, may be some alternative. pt said out of Nexium she has signed release from [facility].  Initial call taken by: Kandice Hams,  February 07, 2009 9:59 AM

## 2010-03-09 NOTE — Medication Information (Signed)
Summary: Approval for Patient Assistance Program/[facility]  Approval for Patient Assistance Program/[facility]   Imported By: Lanelle Bal 05/26/2009 09:35:11  _____________________________________________________________________  External Attachment:    Type:   Image     Comment:   External Document

## 2010-03-09 NOTE — Progress Notes (Signed)
Summary: discuss labs  Phone Note Outgoing Call   Summary of Call: Pls call [name] and let her know that I would like her to return for the following labs- A1C 790.29 LFT's 790.4 acute hepatitis panel 790.4  I would also like her to have an abdominal ultrasound to further evaluate her elevated LFT's.  Initial call taken by: Lemont Fillers FNP,  March 21, 2009 9:56 AM  Follow-up for Phone Call        spoke w/ patient aware of labs and appt scheduled to have labs recheck also aware of ultrasound referral.......Marland KitchenDoristine Devoid  March 21, 2009 4:38 PM   New Problems: HYPERGLYCEMIA, MILD (ICD-790.29) TRANSAMINASES, SERUM, ELEVATED (ICD-790.4)   New Problems: HYPERGLYCEMIA, MILD (ICD-790.29) TRANSAMINASES, SERUM, ELEVATED (ICD-790.4)

## 2010-03-09 NOTE — Letter (Signed)
   [facility] [address] [place], [place] [address] [phone]    March 23, 2009   [facility] [address] [place], [place] [address]  RE:  LAB RESULTS  Dear  [name],  The following is an interpretation of your most recent lab tests.  Please take note of any instructions provided or changes to medications that have resulted from your lab work.  LIVER FUNCTION TESTS:  Fair - review at your next visit    DIABETIC STUDIES:  Good - no changes needed Blood Glucose: 108     Sincerely Yours,    Lemont Fillers FNP

## 2010-03-09 NOTE — Letter (Signed)
   [place] at [facility] [address] [place], [place]  [address]  [place] Phone: [phone]      October 31, 2009   [facility] [address] [place], [place] [address]  RE:  LAB RESULTS  Dear  [name],  The following is an interpretation of your most recent lab tests.  Please take note of any instructions provided or changes to medications that have resulted from your lab work.  ELECTROLYTES:  Good - no changes needed  KIDNEY FUNCTION TESTS:  Good - no changes needed  LIVER FUNCTION TESTS:  Stable - no changes needed     CBC:  Good - no changes needed Vitamin D level is normal.   Sincerely Yours,    Lemont Fillers FNP  Appended Document:  Mailed.

## 2010-03-09 NOTE — Progress Notes (Signed)
Summary: GI appt  ---- Converted from flag ---- ---- 05/10/2009 3:55 PM, Mervin Kung CMA wrote: Left message at [facility] [phone] to return call re: earlier appt. with the NP or PA.  ---- 05/10/2009 3:16 PM, Lemont Fillers FNP wrote: Could you please call GI and see if they can move up her appointment to some time this week with Willette Cluster or one of the physicians?  thanks ------------------------------  Spoke to [facility] [place] [facility] and she states the nurse will have to call me back with authorization. She has sent her a message to call me.  Mervin Kung CMA  May 11, 2009 8:26 AM   Spoke to [facility] [place] [facility] and they can see pt. this afternoon at 2pm. Pt. will see Lucrezia Europe, NP.  Notified pt's husband Trey Paula of appt. time.  Mervin Kung CMA  May 11, 2009 9:27 AM

## 2010-03-09 NOTE — Assessment & Plan Note (Signed)
Summary: EAR PAIN/CDJ   Vital Signs:  Patient profile:   35 year old female Menstrual status:  hysterectomy Weight:      202.6 pounds Temp:     98.4 degrees F oral BP sitting:   100 / 70  (left arm)  Vitals Entered By: [name] (March 28, 2009 11:39 AM) CC: R ear pain x3 days    Primary Care Provider:  Franchot Heidelberg, MD  CC:  R ear pain x3 days .  History of Present Illness: [name] is a 35 year old female who presents today with c/o right ear pain.  She was seen 2/7 for bilateral OM and was treated with bactrim which is on the [facility] $4plan.  She noted that her pain improved, but now has returned in the left ear on friday.  Denies fever, has taken tylenol and ibuprofen without improvement.  Notes + unintentional weight gain.    Allergies: 1)  ! Reglan 2)  Penicillin  Physical Exam  General:  Well-developed,well-nourished,in no acute distress; alert,appropriate and cooperative throughout examination Head:  Normocephalic and atraumatic without obvious abnormalities. No apparent alopecia or balding. Ears:  R TM dull without bulging or erythema Mouth:  Oral mucosa and oropharynx without lesions or exudates.  Teeth in good repair. Neck:  No deformities, masses, or tenderness noted. Thick neck Lungs:  Normal respiratory effort, chest expands symmetrically. Lungs are clear to auscultation, no crackles or wheezes. Heart:  Normal rate and regular rhythm. S1 and S2 normal without gallop, murmur, click, rub or other extra sounds.   Impression & Recommendations:  Problem # 1:  OTITIS MEDIA, RIGHT (ICD-382.9) Assessment Comment Only Will try cipro as this is also on [facility] formulary (patient is allergic to penicillin).  On exam, this appears to be a mild OM, but patient with significant discomfort.  Will plan to treat. Her updated medication list for this problem includes:    Bactrim Ds 800-160 Mg Tabs (Sulfamethoxazole-trimethoprim) ..... One tablet by mouth two times  a day x 7 days    Cipro 500 Mg Tabs (Ciprofloxacin hcl) ..... One tablet by mouth two times a day x 10 days  Problem # 2:  WEIGHT GAIN (ICD-783.1) Assessment: Comment Only Patient has gained several pounds over last few visits.  Will check TSH. Orders: TLB-TSH (Thyroid Stimulating Hormone) (84443-TSH)  Problem # 3:  FATTY LIVER DISEASE (ICD-571.8) Assessment: New history or elevated LFT's, new diagnosis based on recent abdominal US.  Patient counselled on diet, exercise, weight loss.  Will check lipids today.    Complete Medication List: 1)  Pristiq 100 Mg Xr24h-tab (Desvenlafaxine succinate) .... One daily 2)  Butalbital-apap-caff-cod 50-325-40-30 Mg Caps (Butalbital-apap-caff-cod) .... One two times a day as per dr. Gerilyn Pilgrim for headache 3)  Carbamazepine 200 Mg Tabs (Carbamazepine) .... Take as directed 4)  Klonopin 0.5 Mg Tabs (Clonazepam) .... One two times a day as needed 5)  Zofran 4 Mg Tabs (Ondansetron hcl) .... One very 6 hours as needed 6)  Aciphex 20 Mg Tbec (Rabeprazole sodium) .... One tablet by mouth daily 7)  Fish Oil Concentrate 1000 Mg Caps (Omega-3 fatty acids) .... Two times a day 8)  Requip 3 Mg Tabs (Ropinirole hcl) .Marland Kitchen.. 1 tab nightly 9)  Womens Multivitamin Plus Tabs (Multiple vitamins-minerals) .... One tablet by mouth daily 10)  Keppra Xr 750 Mg Xr24h-tab (Levetiracetam) .... 2 tablets by mouth daily at bedtime 11)  Bactrim Ds 800-160 Mg Tabs (Sulfamethoxazole-trimethoprim) .... One tablet by mouth two times a day  x 7 days 12)  Cipro 500 Mg Tabs (Ciprofloxacin hcl) .... One tablet by mouth two times a day x 10 days  Other Orders: Venipuncture ([address]) TLB-Lipid Panel (80061-LIPID)  Patient Instructions: 1)  Please call if your symptoms worsen or do not improve in 1 week. 2)  Keep appointment with neuro as scheduled. Prescriptions: CIPRO 500 MG TABS (CIPROFLOXACIN HCL) one tablet by mouth two times a day x 10 days  #20 x 0   Entered and Authorized by:    Lemont Fillers FNP   Signed by:   Lemont Fillers FNP on 03/28/2009   Method used:   Electronically to        [facility] (retail)       [address]       [place], [place]  [address]       Ph: [phone]       Fax: [phone]   RxID:   [id number]

## 2010-03-09 NOTE — Progress Notes (Signed)
Summary: Labs  Phone Note Call from Patient Call back at [facility] Phone [phone]   Call For: Dr Marina Goodell Summary of Call: Endo on Monday. Was told she would need more lab but she has not heard back yet. Initial call taken by: Leanor Kail [facility],  May 19, 2009 9:09 AM  Follow-up for Phone Call        Pt. ntfd. to come for additional labs and given rov appt. Follow-up by: Teryl Lucy RN,  May 19, 2009 12:28 PM

## 2010-03-09 NOTE — Assessment & Plan Note (Signed)
Summary: HEADACHE X 3 DAYS/MHF--rm 4   Vital Signs:  Patient profile:   35 year old female Menstrual status:  hysterectomy Height:      66.5 inches Weight:      194.75 pounds BMI:     31.07 Temp:     98.0 degrees F oral Pulse rate:   78 / minute Pulse rhythm:   regular Resp:     16 per minute BP sitting:   120 / 80  (right arm) Cuff size:   regular  Vitals Entered By: Mervin Kung CMA Duncan Dull) (February 24, 2010 1:26 PM)  Is Patient Diabetic? No Pain Assessment Patient in pain? yes     Location: head Type: constant, stabbing at times Onset of pain  3 days   Primary Care Provider:  Sandford Craze, FNP   History of Present Illness: [name] is a 35 year old female who presents today with chief complaint of HA.  Husband told patient he thought she had a seizure on Tuesday night.  Denies LOC.  HA started on Wednesday morning.  Dull "all over ache" throbs at times.  Whole head hurts, + neck pain.  Denies fever.  Mild neck stiffness.  Has tried ibuprofen, tylenol, hydrocodone- minimal improvement. Ice pack on neck helps some.  +photophobia, +phonophobia.  Rates  pain 7/10 at present, but overall has been as high as 10/10.   Allergies: 1)  ! Reglan 2)  ! * Iv Phenergan 3)  ! * Eggs 4)  Penicillin  Physical Exam  General:  Well-developed,well-nourished, laying down on stretcher  (room is dark) Head:  Normocephalic and atraumatic without obvious abnormalities. No apparent alopecia or balding. Eyes:  PERRLA Ears:  External ear exam shows no significant lesions or deformities.  Otoscopic examination reveals clear canals, tympanic membranes are intact bilaterally without bulging, retraction, inflammation or discharge. Hearing is grossly normal bilaterally. Neck:  No deformities, masses, or tenderness noted. No nuchal rigidity Lungs:  Normal respiratory effort, chest expands symmetrically. Lungs are clear to auscultation, no crackles or wheezes. Heart:  Normal rate and  regular rhythm. S1 and S2 normal without gallop, murmur, click, rub or other extra sounds. Psych:  Cognition and judgment appear intact. Alert and cooperative with normal attention span and concentration. No apparent delusions, illusions, hallucinations   Impression & Recommendations:  Problem # 1:  MIGRAINE, COMMON (ICD-346.10) Assessment Deteriorated Will give patient a trial of maxalt.   maxalt mlt lot number [id number] exp 02/2012  Her updated medication list for this problem includes:    Maxalt-mlt 10 Mg Tbdp (Rizatriptan benzoate) ..... One tablet under tongue as needed for migraine.  may repeat in 2 hours once if symptoms persist  Problem # 2:  SEIZURE DISORDER (ICD-780.39) Assessment: Comment Only Continue Keppra- defer management to neurology. Her updated medication list for this problem includes:    Klonopin 0.5 Mg Tabs (Clonazepam) .Marland Kitchen... Take 1 by mouth at bedtime and as needed    Keppra Xr 750 Mg Xr24h-tab (Levetiracetam) ..... One tablet by mouth three times a day  Complete Medication List: 1)  Pristiq 100 Mg Xr24h-tab (Desvenlafaxine succinate) .... Take two by mouth at bedtime 2)  Klonopin 0.5 Mg Tabs (Clonazepam) .... Take 1 by mouth at bedtime and as needed 3)  Zofran 4 Mg Tabs (Ondansetron hcl) .... One very 6 hours as needed 4)  Omeprazole 20 Mg Tbec (Omeprazole) .... One tablet by mouth daily 5)  Fish Oil Concentrate 1000 Mg Caps (Omega-3 fatty acids) .... Two times  a day 6)  Requip 3 Mg Tabs (Ropinirole hcl) .Marland Kitchen.. 1 tab nightly 7)  Womens Multivitamin Plus Tabs (Multiple vitamins-minerals) .... One tablet by mouth daily 8)  Keppra Xr 750 Mg Xr24h-tab (Levetiracetam) .... One tablet by mouth three times a day 9)  Simvastatin 10 Mg Tabs (Simvastatin) .... One tab by mouth daily in the evening 10)  Lomotil 2.5-0.025 Mg Tabs (Diphenoxylate-atropine) .... One to two tablets by mouth every 6 hours as needed for diarrhea 11)  Ferrous Sulfate 325 (65 Fe) Mg Tabs (Ferrous  sulfate) .... One tablet by mouth daily 12)  Zyrtec Allergy 10 Mg Caps (Cetirizine hcl) .... One tablet by mouth daily 13)  Nuvigil 250 Mg Tabs (Armodafinil) .Marland Kitchen.. 1- 1 1/2 tablet by mouth  every morning 14)  Adderall 30 Mg Tabs (Amphetamine-dextroamphetamine) .... Take 1 tablet by mouth once a day. 15)  Maxalt-mlt 10 Mg Tbdp (Rizatriptan benzoate) .... One tablet under tongue as needed for migraine.  may repeat in 2 hours once if symptoms persist  Patient Instructions: 1)  Please use Aleve 220mg  by mouth every 12 hours as needed. 2)  Please call if your symptoms worsen or do not improve. Prescriptions: MAXALT-MLT 10 MG TBDP (RIZATRIPTAN BENZOATE) one tablet under tongue as needed for migraine.  May repeat in 2 hours once if symptoms persist  #3 x 0   Entered and Authorized by:   Lemont Fillers FNP   Signed by:   Lemont Fillers FNP on 02/24/2010   Method used:   Electronically to        [facility]* (retail)       [address] [place], [place]  [address]       Ph: [phone]       Fax: [phone]   RxID:   [id number]    Orders Added: 1)  Est. Patient Level III [401[address]]    Current Allergies (reviewed today): ! REGLAN ! * IV PHENERGAN ! * EGGS PENICILLIN

## 2010-03-09 NOTE — Progress Notes (Signed)
Summary: refill -- Requip  Phone Note Refill Request Message from:  Fax from Pharmacy on November 08, 2009 2:23 PM  Refills Requested: Medication #1:  ropintrole hcl 3mg  tablet   Dosage confirmed as above?Dosage Confirmed   Brand Name Necessary? No   Supply Requested: 1 month   Last Refilled: 10/06/2009  Method Requested: Electronic Next Appointment Scheduled: 01-27-10 [place]  Initial call taken by: Roselle Locus,  November 08, 2009 2:23 PM    Prescriptions: REQUIP 3 MG TABS (ROPINIROLE HCL) 1 tab nightly  #30 x 2   Entered by:   Mervin Kung CMA (AAMA)   Authorized by:   Lemont Fillers FNP   Signed by:   Mervin Kung CMA (AAMA) on 11/08/2009   Method used:   Electronically to        [facility]* (retail)       [address] [place], [place]  [address]       Ph: [phone]       Fax: [phone]   RxID:   [id number]

## 2010-03-09 NOTE — Progress Notes (Signed)
Summary: RX-  Phone Note Refill Request   Refills Requested: Medication #1:  ROPINIROLE HCL 3 MG [facility]--[phone]  Initial call taken by: Freddy Jaksch,  April 04, 2009 11:03 AM    Prescriptions: REQUIP 3 MG TABS (ROPINIROLE HCL) 1 tab nightly  #30 x 3   Entered by:   Kandice Hams   Authorized by:   Neena Rhymes MD   Signed by:   Kandice Hams on 04/04/2009   Method used:   Faxed to ...       [facility]* (retail)       [address] [place], [place]  [address]       Ph: [phone]       Fax: [phone]   RxID:   [id number]

## 2010-03-09 NOTE — Letter (Signed)
Summary: Out of School  [place] at [facility]  [address]   [place], [place] [address]   Phone: [phone]  Fax: [phone]    December 01, 2009   Student:  [name]    To Whom It May Concern:   For Medical reasons, please excuse the above named student from school for the following dates:  Start:   November 29, 2009  End:    December 02, 2009  If you need additional information, please feel free to contact our office.   Sincerely,    Lemont Fillers FNP    ****This is a legal document and cannot be tampered with.  Schools are authorized to verify all information and to do so accordingly.

## 2010-03-09 NOTE — Procedures (Signed)
Summary: Dr Madilyn Fireman EGD & Path   EGD  Procedure date:  08/04/2008  Findings:      Location: [facility]   NAME:  [name], [name]               ACCOUNT NO.:  [id number]      MEDICAL RECORD NO.:  [id number]          PATIENT TYPE:  INP      LOCATION:  1520                         FACILITY:  [facility]      PHYSICIAN:  John C. Madilyn Fireman, M.D.    DATE OF BIRTH:  [birth year]      DATE OF PROCEDURE:  08/04/2008   DATE OF DISCHARGE:                                  OPERATIVE REPORT      PROCEDURE:  Esophagogastroduodenoscopy with biopsy.      INDICATIONS FOR PROCEDURE:  Persistent nausea, vomiting, epigastric pain   and intermittent diarrhea over the course of greater than 1 week with   negative gallbladder ultrasound, failure to respond to  proton pump   inhibitor.      PROCEDURE:  The patient was placed in the left lateral decubitus   position and placed on the pulse monitor with continuous low-flow oxygen   delivered by nasal cannula.  She was sedated with 50 mcg IV fentanyl and   6 mg IV Versed.  The Olympus video endoscope was advanced under direct   vision into the oropharynx and esophagus.  The esophagus was straight,   of normal caliber, with the squamocolumnar line at 38 cm.  There was no   visible hiatal hernia, ring stricture or other abnormality of the   esophagus or GE junction.  The stomach was entered and there was a   moderately large amount of retained unrecognizable partly-digested food   in the dependent portions of the fundus.  Retroflexed view of the cardia   was otherwise unremarkable.  Part of the fundus was obscured but in the   fundus and particularly in the body along the lesser curvature, there   was a diffuse appearance of gastritis.  This appeared to be more   pronounced than one would expect from vomiting trauma and there were   areas of fairly pronounced nodularity within the inflamed area which was   otherwise characterized by erythema and a snake skin  appearance.   Multiple biopsies were taken.  No focal ulcers were seen and no focal   mass, although I could not exclude an infiltrating neoplastic process.   The antrum appeared spared and normal and the pylorus was patent with no   suggestion of scarring or fixation.  The duodenum was entered and both   the bulb and second portion were well inspected and appeared be within   normal limits.  Due to the diarrhea, biopsies were taken of normal-   appearing second portion of the duodenum to rule out celiac disease or   other process contributing to diarrhea.  The scope was then withdrawn   and the patient returned to the recovery room in stable condition.  She   tolerated the procedure well and there were no immediate complications.      IMPRESSION:   1. Apparent inflammatory process  of the mid to proximal stomach.   2. Retained gastric contents.   3. No evidence of mechanical outlet obstruction.      PLAN:  Await all biopsy results.  Continue proton pump inhibitor and we   will add low-dose Reglan for now.                  ______________________________   Everardo All. Madilyn Fireman, M.D.            JCH/MEDQ  D:  08/04/2008  T:  08/04/2008  Job:  [id number]    SP-Surgical Pathology - STATUS: Final  .                                         Perform Date: 30Jun10 00:01  Ordered By: Gwenyth Ober,            Ordered Date: 30Jun10 10:31  Facility: [facility]                              Department: [facility]  Service Report Text  [address] [place], [place] [address]   [phone]    REPORT OF SURGICAL PATHOLOGY    Case #: [id number]   Patient Name: [name], [name].   Office Chart Number: N/A    MRN: [id number]   Pathologist: Havery Moros, MD   DOB/Age Jun 10, 1975 (Age: 35) Gender: F   Date Taken: 08/04/2008   Date Received: 08/04/2008    FINAL DIAGNOSIS    ***MICROSCOPIC EXAMINATION AND DIAGNOSIS***    1. DUODENUM: BENIGN SMALL BOWEL MUCOSA. NO ACTIVE INFLAMMATION   OR VILLOUS  ATROPHY IDENTIFIED.    2. STOMACH, BIOPSY: FOCAL MUCOSAL EROSION ASSOCIATED WITH   ACTIVE GASTRITIS AND REACTIVE/HYPERPLASTIC EPITHELIAL CHANGES.    COMMENT   2. A Warthin-Starry stain is performed to determine the   possibility of the presence of Helicobacter pylori. The   Warthin-Starry stain is negative for organisms of Helicobacter   pylori. The control(s) stained appropriately. (BNS:mj 08/05/08)    mj   Date Reported: 08/05/2008 Havery Moros, MD   *** Electronically Signed Out By BNS ***    Clinical information   1) Rule out Sprue. 2) Nodular focal gastritis; rule out neoplasm,   eosinophilic gastritis; H. pylori; epigastric pain, nausea and   vomiting. (lw)    specimen(s) obtained   1: Duodenum, biopsy   2: Stomach, biopsy    Gross Description   1. Received in formalin are tan, soft tissue fragments that are   submitted in toto. Number: three   Size: 0.2 to 0.4 cm    2. Received in formalin are tan, soft tissue fragments that are   submitted in toto. Number: three   Size: 0.1 to 0.3 cm (SW:mw 08-04-08)    mw/    DUOBX-Duodenum, biopsy   STOMBX-Stomach, biopsy   1. Received in formalin are tan, soft tissue fragments that are   submitted in toto. Number: three   Size: 0.2 to 0.4 cm    2. Received in formalin are tan, soft tissue fragments that are   submitted in toto. Number: three   Size: 0.1 to 0.3 cm (SW:mw 08-04-08)   1. DUODENUM: BENIGN SMALL BOWEL MUCOSA. NO ACTIVE INFLAMMATION   OR VILLOUS ATROPHY IDENTIFIED.    2. STOMACH, BIOPSY: FOCAL MUCOSAL  EROSION ASSOCIATED WITH   ACTIVE GASTRITIS AND REACTIVE/HYPERPLASTIC EPITHELIAL CHANGES.  Additional Information  HL7 RESULT STATUS : F  External IF Update Timestamp : 2008-08-04:10:30:00.000000

## 2010-03-09 NOTE — Letter (Signed)
Summary: EGD Instructions  [facility]  [address] [place], [place] [address]   Phone: [phone]  Fax: [phone]       [name]    1976-01-26    MRN: [id number]       Procedure Day /Date: 05-16-09     Arrival Time: 2:30 PM      Procedure Time: 3:30 PM     Location of Procedure:                    X     [facility] (4th Floor) PREPARATION FOR ENDOSCOPY   On 4-11-11THE DAY OF THE PROCEDURE:  1.   No solid foods, milk or milk products are allowed after midnight the night before your procedure.  2.   Do not drink anything colored red or purple.  Avoid juices with pulp.  No orange juice.  3.  You may drink clear liquids until  1:30 PM , which is 2 hours before your procedure.                                                                                                CLEAR LIQUIDS INCLUDE: Water Jello Ice Popsicles Tea (sugar ok, no milk/cream) Powdered fruit flavored drinks Coffee (sugar ok, no milk/cream) Gatorade Juice: apple, white grape, white cranberry  Lemonade Clear bullion, consomm, broth Carbonated beverages (any kind) Strained chicken noodle soup Hard Candy   MEDICATION INSTRUCTIONS  Unless otherwise instructed, you should take regular prescription medications with a small sip of water as early as possible the morning of your procedure.        OTHER INSTRUCTIONS  You will need a responsible adult at least 37 years of age to accompany you and drive you home.   This person must remain in the waiting room during your procedure.  Wear loose fitting clothing that is easily removed.  Leave jewelry and other valuables at home.  However, you may wish to bring a book to read or an iPod/MP3 player to listen to music as you wait for your procedure to start.  Remove all body piercing jewelry and leave at home.  Total time from sign-in until discharge is approximately 2-3 hours.  You should go home directly after your  procedure and rest.  You can resume normal activities the day after your procedure.  The day of your procedure you should not:   Drive   Make legal decisions   Operate machinery   Drink alcohol   Return to work  You will receive specific instructions about eating, activities and medications before you leave.    The above instructions have been reviewed and explained to me by   _______________________    I fully understand and can verbalize these instructions _____________________________ Date _________

## 2010-03-09 NOTE — Progress Notes (Signed)
Summary: pharmacy change  Phone Note Call from Patient   Caller: Spouse Call For: Lemont Fillers FNP Summary of Call: Pt's husband left message requesting Maxalt rx be sent to [facility] in [place] as it is closer to pt's home. Rx called to kelly at [facility] and cancelled at [facility]. Pt has been notified. Initial call taken by: Mervin Kung CMA Duncan Dull),  February 24, 2010 4:35 PM

## 2010-03-09 NOTE — Assessment & Plan Note (Signed)
Summary: 1 week follow up/mhf   Vital Signs:  Patient profile:   35 year old female Menstrual status:  hysterectomy Height:      66.50 inches Weight:      196.50 pounds BMI:     31.35 Temp:     97.7 degrees F oral Pulse rate:   90 / minute Pulse rhythm:   regular Resp:     16 per minute BP sitting:   126 / 82  (right arm) Cuff size:   regular  Vitals Entered By: Mervin Kung CMA (May 17, 2009 1:15 PM) CC: room 4   1 week follow up.  Pt states she feels drained. Has also had itching all over today.  Had endoscopy yesterday. She states she has not started Keppra yet.   Primary Care Provider:  Sandford Craze, FNP  CC:  room 4   1 week follow up.  Pt states she feels drained. Has also had itching all over today.  Had endoscopy yesterday. She states she has not started Keppra yet.Marland Kitchen  History of Present Illness: [name] is a 35 year old female who presents today for follow up of her gastoenteritis.  Since her visit her diarrhea has resolved.  She continues to have nausea,  but no vomitting.  Last BM was 1 week ago.  Pt had EGD yesterday with Dr. Marina Goodell which was normal.  She has developed some skin itching today but no rash.  Tolerating by mouth's but not quite up to 3 meals a day yet.  Notes that she feels better since last visit, though drowsy from using Benadryl for itching.    Chart review indicates that she also had an ANA performed by Dr. Marina Goodell which was positive with a 1:1280 titer.  She tells me that she is to complete some additional blood work prior to her follow up with Dr Marina Goodell in regards to her laboratory findings.  She denies any history of joint pain or malar rash.    Patient also expresses concern RE: mole on her chest which has been present for years but only recently has become much darker.  She wishes to have this mole removed.    Allergies: 1)  ! Reglan 2)  ! * Iv Phenergan 3)  Penicillin  Physical Exam  General:  Well-developed,well-nourished,in no  acute distress; alert,appropriate and cooperative throughout examination Head:  Normocephalic and atraumatic without obvious abnormalities. No apparent alopecia or balding. Lungs:  Normal respiratory effort, chest expands symmetrically. Lungs are clear to auscultation, no crackles or wheezes. Heart:  Normal rate and regular rhythm. S1 and S2 normal without gallop, murmur, click, rub or other extra sounds. Abdomen:  Bowel sounds positive,abdomen soft and non-tender without masses, organomegaly or hernias noted. Skin:  small (aprox 2mm) dark Wecker mole noted on anterior chest between breasts just right of pt's midline.  Also + lesion noted on top of her right ear- this is noted to be skin toned and smooth.     Impression & Recommendations:  Problem # 1:  GASTROENTERITIS (ICD-558.9) Assessment Improved This is clinically improved, EGD was normal.  GI evaluation is ongoing (autoimmune eval)- pt instructed to follow up with GI as scheduled. Pt to follow up at this office in 6 weeks.  Will consider referral to Rheumatology at that time.  Unfortunately, patient is uninsured and this makes seeking care outside of the [facility] financially difficult for her.   Her updated medication list for this problem includes:    Zofran 4 Mg  Tabs (Ondansetron hcl) ..... One very 6 hours as needed  Problem # 2:  OVARIAN CYST (ICD-620.2) Assessment: New Incidental note of right adnexal cyst (7cm) on CT.  Pelvic ultrasound was recommended by radiology due to large size.  Will order.    Problem # 3:  MOLE (ICD-216.9) Assessment: New  Consent was obtained, area cleansed with betadine then injected with 1% lidocaine + epi, shave biopsy performed, tissue sent for pathology.  Orders: Shave Skin Lesion < 0.5 cm/trunk/arm/leg (11300)  Problem # 4:  IRON DEFICIENCY (ICD-280.9) Assessment: New Hgb normal, add once daily iron.  Her updated medication list for this problem includes:    Ferrous Sulfate 325 (65 Fe) Mg Tabs  (Ferrous sulfate) ..... One tablet by mouth daily  Complete Medication List: 1)  Pristiq 100 Mg Xr24h-tab (Desvenlafaxine succinate) .... Take two by mouth at bedtime 2)  Carbamazepine 200 Mg Tabs (Carbamazepine) .... Take 1 tab by mouth at bedtime 3)  Klonopin 0.5 Mg Tabs (Clonazepam) .... Take 1/2 by mouth at bedtime and as needed 4)  Zofran 4 Mg Tabs (Ondansetron hcl) .... One very 6 hours as needed 5)  Aciphex 20 Mg Tbec (Rabeprazole sodium) .... One tablet by mouth daily 6)  Fish Oil Concentrate 1000 Mg Caps (Omega-3 fatty acids) .... Two times a day 7)  Requip 3 Mg Tabs (Ropinirole hcl) .Marland Kitchen.. 1 tab nightly 8)  Womens Multivitamin Plus Tabs (Multiple vitamins-minerals) .... One tablet by mouth daily 9)  Keppra Xr 750 Mg Xr24h-tab (Levetiracetam) .... 2 tablets by mouth daily at bedtime (waiting on assistance) 10)  Simvastatin 10 Mg Tabs (Simvastatin) .... One tab by mouth daily in the evening 11)  Lomotil 2.5-0.025 Mg Tabs (Diphenoxylate-atropine) .... One to two tablets by mouth every 6 hours as needed for diarrhea 12)  Pepcid 20 Mg Tabs (Famotidine) .... Take 1 tablet by mouth once a day 13)  Ferrous Sulfate 325 (65 Fe) Mg Tabs (Ferrous sulfate) .... One tablet by mouth daily  Other Orders: Ultrasound (Ultrasound)  Patient Instructions: 1)  Please follow up in 6 weeks, sooner if problems or concerns. 2)  Call neurology to discuss transition to Keppra from carbamezepine. 3)  Keep follow up appointment with Dr. Marina Goodell.  Current Allergies (reviewed today): ! REGLAN ! * IV PHENERGAN PENICILLIN

## 2010-03-09 NOTE — Progress Notes (Signed)
Summary: sooner appt.  Phone Note From Other Clinic   Caller: Nicki Guadalajara @ [facility]  [phone] Call For: Dr. Jarold Motto Summary of Call: pt. has an appt on 06-09-09 and wants a sooner appt. Vomiting, diarrhea and abd. pain. Initial call taken by: Karna Christmas,  May 10, 2009 3:52 PM  Follow-up for Phone Call        Pt given appt to see Rozetta Nunnery this PM.  Nicki Guadalajara notified.  Chart ordered. Follow-up by: Ashok Cordia RN,  May 11, 2009 9:27 AM

## 2010-03-15 NOTE — Assessment & Plan Note (Signed)
Summary: headache / tf,cma--rm 4   Vital Signs:  Patient profile:   35 year old female Menstrual status:  hysterectomy Height:      66.5 inches Weight:      194.75 pounds BMI:     31.07 Temp:     97.8 degrees F oral Pulse rate:   84 / minute Pulse rhythm:   regular Resp:     18 per minute BP sitting:   112 / 80  (right arm) Cuff size:   regular  Vitals Entered By: Mervin Kung CMA Duncan Dull) (March 01, 2010 3:29 PM) CC: Pt states she has had headache since last Tuesday. Maxalt only gives slight relief., Headache Is Patient Diabetic? No Pain Assessment Patient in pain? yes     Location: head   Primary Care Provider:  Sandford Craze, FNP  CC:  Pt states she has had headache since last Tuesday. Maxalt only gives slight relief. and Headache.  History of Present Illness: Patient is a 35 year old female who presents today for followup of her migraine headache. Initially, the patient was treated with Maxalt. She had slight improvement in her headache with use of Maxalt. she notes that her headache went away on Sunday night. States she was able to go to school on Monday, but then came home with a headache on Monday evening. She does note that she was treated in the past briefly with Vivelle dot, and had some improvement in her headaches with this medication.  Headache HPI:      On a scale of 1-10, she describes the headache as an 8.  The location of the headaches are occipital.  Headache quality is throbbing or pulsating, pressure or tightness, and sharp (knife-like).  The headaches are associated with nausea, photophobia, phonophobia, and tearing of the eyes.        Positive alarm features include scalp tenderness.  The patient denies first or worst H/A of life, fever, and seizures.         Allergies: 1)  ! Reglan 2)  ! * Iv Phenergan 3)  ! * Eggs 4)  Penicillin  Past History:  Past Medical History: Last updated: 10/28/2009 Sz d/o- follows w/ Dr  Richardean Chimera depression/anxiety endometriosis- resolved w/ hysterectomy GERD IBS Chronic Headaches Hyperlipidemia gastroparesis  Past Surgical History: Last updated: 12/24/2007 Hysterectomy - Partial - severe endometriosis Post appendectomy secondary to above hx of migraine headaches  Review of Systems       see history of present illness  Physical Exam  General:  uncomfortable-appearing female laying on exam  table with eyes closed Lungs:  Normal respiratory effort, chest expands symmetrically. Lungs are clear to auscultation, no crackles or wheezes. Heart:  Normal rate and regular rhythm. S1 and S2 normal without gallop, murmur, click, rub or other extra sounds. Neurologic:  alert & oriented X3, cranial nerves II-XII intact, and strength normal in all extremities.   Psych:  Cognition and judgment appear intact. Alert and cooperative with normal attention span and concentration. No apparent delusions, illusions, hallucinations   Impression & Recommendations:  Problem # 1:  MIGRAINE, COMMON (ICD-346.10) Assessment Unchanged Will plan to add a low-dose ACE inhibitor for migraine prophylaxis. Will also add Vicodin for p.r.n. use on a short-term basis. Records were requested from her previous GYN in regards to her hormone replacement therapy. Her updated medication list for this problem includes:    Maxalt-mlt 10 Mg Tbdp (Rizatriptan benzoate) ..... One tablet under tongue as needed for migraine.  may repeat in 2 hours  once if symptoms persist    Vicodin 5-500 Mg Tabs (Hydrocodone-acetaminophen) ..... One tablet by mouth every 6 hours as needed for severe headache  Complete Medication List: 1)  Pristiq 100 Mg Xr24h-tab (Desvenlafaxine succinate) .... Take two by mouth at bedtime 2)  Klonopin 0.5 Mg Tabs (Clonazepam) .... Take 1 by mouth at bedtime and as needed 3)  Zofran 4 Mg Tabs (Ondansetron hcl) .... One very 6 hours as needed 4)  Omeprazole 20 Mg Tbec (Omeprazole) .... One  tablet by mouth daily 5)  Fish Oil Concentrate 1000 Mg Caps (Omega-3 fatty acids) .... Two times a day 6)  Requip 3 Mg Tabs (Ropinirole hcl) .Marland Kitchen.. 1 tab nightly 7)  Womens Multivitamin Plus Tabs (Multiple vitamins-minerals) .... One tablet by mouth daily 8)  Keppra Xr 750 Mg Xr24h-tab (Levetiracetam) .... One tablet by mouth three times a day 9)  Simvastatin 10 Mg Tabs (Simvastatin) .... One tab by mouth daily in the evening 10)  Lomotil 2.5-0.025 Mg Tabs (Diphenoxylate-atropine) .... One to two tablets by mouth every 6 hours as needed for diarrhea 11)  Ferrous Sulfate 325 (65 Fe) Mg Tabs (Ferrous sulfate) .... One tablet by mouth daily 12)  Zyrtec Allergy 10 Mg Caps (Cetirizine hcl) .... One tablet by mouth daily 13)  Nuvigil 250 Mg Tabs (Armodafinil) .Marland Kitchen.. 1- 1 1/2 tablet by mouth  every morning 14)  Adderall 30 Mg Tabs (Amphetamine-dextroamphetamine) .... Take 1 tablet by mouth once a day. 15)  Maxalt-mlt 10 Mg Tbdp (Rizatriptan benzoate) .... One tablet under tongue as needed for migraine.  may repeat in 2 hours once if symptoms persist 16)  Lisinopril 5 Mg Tabs (Lisinopril) .... One tablet by mouth once daily 17)  Vicodin 5-500 Mg Tabs (Hydrocodone-acetaminophen) .... One tablet by mouth every 6 hours as needed for severe headache  Patient Instructions: 1)  Call if your symptoms worsen, or if they do not improve. 2)  Follow up in 1 month. Prescriptions: VICODIN 5-500 MG TABS (HYDROCODONE-ACETAMINOPHEN) one tablet by mouth every 6 hours as needed for severe headache  #20 x 0   Entered and Authorized by:   Lemont Fillers FNP   Signed by:   Lemont Fillers FNP on 03/01/2010   Method used:   Print then Give to Patient   RxID:   [id number] LISINOPRIL 5 MG TABS (LISINOPRIL) one tablet by mouth once daily  #30 x 1   Entered and Authorized by:   Lemont Fillers FNP   Signed by:   Lemont Fillers FNP on 03/01/2010   Method used:   Electronically to        [facility].* (retail)       [address]       [place], [place]  [address]       Ph: [phone]       Fax: [phone]   RxID:   [id number]    Orders Added: 1)  Est. Patient Level III [[address]]    Current Allergies (reviewed today): ! REGLAN ! * IV PHENERGAN ! * EGGS PENICILLIN

## 2010-03-22 ENCOUNTER — Encounter: Payer: Self-pay | Admitting: Family

## 2010-03-22 ENCOUNTER — Ambulatory Visit (INDEPENDENT_AMBULATORY_CARE_PROVIDER_SITE_OTHER): Payer: Self-pay | Admitting: Family

## 2010-03-22 DIAGNOSIS — J029 Acute pharyngitis, unspecified: Secondary | ICD-10-CM | POA: Insufficient documentation

## 2010-03-23 NOTE — Letter (Signed)
Summary: Records Dated 7-10 thru 6-11/[facility]  Records Dated 7-10 thru 6-11/[facility]   Imported By: Lanelle Bal 03/17/2010 10:42:45  _____________________________________________________________________  External Attachment:    Type:   Image     Comment:   External Document

## 2010-03-29 NOTE — Letter (Signed)
Summary: Out of School  [facility]  [address]   [place], [place] [address]   Phone: [phone]  Fax: [phone]      March 22, 2010   Student:  DELINDA MALAN    To Whom It May Concern:   For Medical reasons, please excuse the above named student from school for the following dates:  Start:   March 22, 2010  End:    March 22, 2010   If you need additional information, please feel free to contact our office.   Sincerely,    Mervin Kung CMA (AAMA)    ****This is a legal document and cannot be tampered with.  Schools are authorized to verify all information and to do so accordingly.  Appended Document: Out of School Given to pt's spouse.

## 2010-03-29 NOTE — Assessment & Plan Note (Signed)
Summary: not feeling well/ss--rm 5   Vital Signs:  Patient profile:   35 year old female Menstrual status:  hysterectomy Height:      66.5 inches Weight:      197.25 pounds BMI:     31.47 Temp:     97.9 degrees F oral Pulse rate:   72 / minute Pulse rhythm:   regular Resp:     18 per minute BP sitting:   102 / 80  (right arm) Cuff size:   regular  Vitals Entered By: Mervin Kung CMA Duncan Dull) (March 22, 2010 10:32 AM) CC: Pt states she has had sore throat since this a.m. Daughter diagnosed with Strep. Has had right ear pain x 2 days. Is Patient Diabetic? No Pain Assessment Patient in pain? no      Comments Adderall has been replaced with Nuvigil. Nicki Guadalajara Fergerson CMA Duncan Dull)  March 22, 2010 10:38 AM    Primary Care Provider:  Sandford Craze, FNP  CC:  Pt states she has had sore throat since this a.m. Daughter diagnosed with Strep. Has had right ear pain x 2 days.Marland Kitchen  History of Present Illness: Ms.  [name] is a 35 year old female who presents with cc of sore throat since last night.  Denies associated fever.  + associated R ear pain.  Took ibuprofen with some improvement in ear pain.  + nasal congestion.  Denies cough.  Denies associated nausea.  Daughter was diagnosed yesterday with strep throat.  Allergies: 1)  ! Reglan 2)  ! * Iv Phenergan 3)  ! * Eggs 4)  Penicillin  Physical Exam  General:  Well-developed,well-nourished,in no acute distress; alert,appropriate and cooperative throughout examination Ears:  mild erythema of right tympanic membrane without significant bulging Mouth:  mild pharyngeal erythema no exudates are noted Lungs:  Normal respiratory effort, chest expands symmetrically. Lungs are clear to auscultation, no crackles or wheezes. Heart:  Normal rate and regular rhythm. S1 and S2 normal without gallop, murmur, click, rub or other extra sounds. Cervical Nodes:  mild anterior cervical adenopathy, positive tenderness to palpation   Impression  & Recommendations:  Problem # 1:  PHARYNGITIS (ICD-462) Assessment New rapid strep is negative, however patient is also complaining of right ear pain he may have an early right otitis media. She has positive known contact with daughter who was diagnosed yesterday with strep. We'll plan to treat with amoxicillin empirically. Patient denies allergies amoxicillin and notes that she often will develop yeast infection after treatment. amoxicillin was cancelled at [facility] and sent to [facility] instead at patient's request.  Her updated medication list for this problem includes:    Amoxicillin 500 Mg Caps (Amoxicillin) .Marland Kitchen... 2 caps by mouth three  times a day for 10 days  Complete Medication List: 1)  Pristiq 100 Mg Xr24h-tab (Desvenlafaxine succinate) .... Take two by mouth at bedtime 2)  Klonopin 0.5 Mg Tabs (Clonazepam) .... Take 1 by mouth at bedtime and as needed 3)  Zofran 4 Mg Tabs (Ondansetron hcl) .... One very 6 hours as needed 4)  Omeprazole 20 Mg Tbec (Omeprazole) .... One tablet by mouth daily 5)  Fish Oil Concentrate 1000 Mg Caps (Omega-3 fatty acids) .... Two times a day 6)  Requip 3 Mg Tabs (Ropinirole hcl) .Marland Kitchen.. 1 tab nightly 7)  Womens Multivitamin Plus Tabs (Multiple vitamins-minerals) .... One tablet by mouth daily 8)  Keppra Xr 750 Mg Xr24h-tab (Levetiracetam) .... One tablet by mouth three times a day 9)  Simvastatin 10 Mg Tabs (  Simvastatin) .... One tab by mouth daily in the evening 10)  Lomotil 2.5-0.025 Mg Tabs (Diphenoxylate-atropine) .... One to two tablets by mouth every 6 hours as needed for diarrhea 11)  Ferrous Sulfate 325 (65 Fe) Mg Tabs (Ferrous sulfate) .... One tablet by mouth daily 12)  Zyrtec Allergy 10 Mg Caps (Cetirizine hcl) .... One tablet by mouth daily 13)  Nuvigil 250 Mg Tabs (Armodafinil) .Marland Kitchen.. 1- 1 1/2 tablet by mouth  every morning 14)  Maxalt-mlt 10 Mg Tbdp (Rizatriptan benzoate) .... One tablet under tongue as needed for migraine.  may repeat in  2 hours once if symptoms persist 15)  Lisinopril 5 Mg Tabs (Lisinopril) .... One tablet by mouth once daily 16)  Vicodin 5-500 Mg Tabs (Hydrocodone-acetaminophen) .... One tablet by mouth every 6 hours as needed for severe headache 17)  Amoxicillin 500 Mg Caps (Amoxicillin) .... 2 caps by mouth three  times a day for 10 days  Patient Instructions: 1)  Call if your symptoms worsen, or if they are not improved in 48-72 hours. Prescriptions: AMOXICILLIN 500 MG CAPS (AMOXICILLIN) 2 caps by mouth three  times a day for 10 days  #60 x 0   Entered and Authorized by:   Lemont Fillers FNP   Signed by:   Lemont Fillers FNP on 03/22/2010   Method used:   Electronically to        [facility].* (retail)       [address]       [place], [place]  [address]       Ph: [phone]       Fax: [phone]   RxID:   [id number] AMOXICILLIN 500 MG CAPS (AMOXICILLIN) 2 caps by mouth three  times a day for 10 days  #60 x 0   Entered and Authorized by:   Lemont Fillers FNP   Signed by:   Lemont Fillers FNP on 03/22/2010   Method used:   Electronically to        [facility]* (retail)       [address], [place]  [address]       Ph: [phone]       Fax: [phone]   RxID:   [id number]    Orders Added: 1)  Est. Patient Level II [884[address]]    Current Allergies (reviewed today): ! REGLAN ! * IV PHENERGAN ! * EGGS PENICILLIN  Appended Document: not feeling well/ss--rm 5    Clinical Lists Changes  Orders: Added new Service order of Rapid Strep [id number]) - Signed Observations: Added new observation of RAPID STREP: negative (03/22/2010 15:07)      Laboratory Results    Other Tests  Rapid Strep: negative  Kit Test Internal QC: Positive   (Normal Range: Negative)

## 2010-04-05 ENCOUNTER — Encounter: Payer: Self-pay | Admitting: Family

## 2010-04-07 ENCOUNTER — Ambulatory Visit (INDEPENDENT_AMBULATORY_CARE_PROVIDER_SITE_OTHER): Payer: Self-pay | Admitting: Family

## 2010-04-07 ENCOUNTER — Encounter: Payer: Self-pay | Admitting: Family

## 2010-04-07 DIAGNOSIS — M79601 Pain in right arm: Secondary | ICD-10-CM | POA: Insufficient documentation

## 2010-04-07 DIAGNOSIS — R6882 Decreased libido: Secondary | ICD-10-CM | POA: Insufficient documentation

## 2010-04-07 DIAGNOSIS — G5600 Carpal tunnel syndrome, unspecified upper limb: Secondary | ICD-10-CM

## 2010-04-13 NOTE — Assessment & Plan Note (Signed)
Summary: 1 month follow up /mhf--rm 5   Vital Signs:  Patient profile:   35 year old female Menstrual status:  hysterectomy Height:      66.5 inches Weight:      193.50 pounds BMI:     30.88 Temp:     98.3 degrees F oral Pulse rate:   66 / minute Pulse rhythm:   regular Resp:     16 per minute BP sitting:   124 / 88  (right arm) Cuff size:   regular  Vitals Entered By: Mervin Kung CMA Duncan Dull) (April 07, 2010 10:36 AM) CC: Pt here for 1 month follow up. Is Patient Diabetic? No Pain Assessment Patient in pain? no      Comments Pt no longer takes Fish Oil, Multivit. Hasn't been taking Simvastatin or Ferrous sulfate.  States Pristiq in 100mg  1 every morning. Klonopin has increased to 1mg  1/2 to 1 at bedtime. [name] CMA Duncan Dull)  April 07, 2010 10:43 AM    Primary Care Provider:  Sandford Craze, FNP  CC:  Pt here for 1 month follow up.Marland Kitchen  History of Present Illness: [name] is a 35 year old female who presents today for follow.   1) Narcolepsy- Started Nuvigil for narcolepsy 2 weeks ago. Anorexia, decreased energy. Being treated by Dr. Richardean Chimera.  Notes that this was working better than ther adderall.  No longer drowsy while she is driving.    2) R arm pain- x 3 weeks, difficulty bending.  Drops things with right hand. Arm goes numb when she sleeps on it.  Uses ice pack and ibuprofen.  3) Libido- Decreased libido x 9 years.     Allergies: 1)  ! Reglan 2)  ! * Iv Phenergan 3)  ! * Eggs 4)  Penicillin  Past History:  Past Medical History: Last updated: 10/28/2009 Sz d/o- follows w/ Dr Richardean Chimera depression/anxiety endometriosis- resolved w/ hysterectomy GERD IBS Chronic Headaches Hyperlipidemia gastroparesis  Past Surgical History: Last updated: 12/24/2007 Hysterectomy - Partial - severe endometriosis Post appendectomy secondary to above hx of migraine headaches  Review of Systems       see HPI  Physical Exam  General:   Well-developed,well-nourished,in no acute distress; alert,appropriate and cooperative throughout examination Lungs:  Normal respiratory effort, chest expands symmetrically. Lungs are clear to auscultation, no crackles or wheezes. Heart:  Normal rate and regular rhythm. S1 and S2 normal without gallop, murmur, click, rub or other extra sounds. Neurologic:  bilateral hand grasps equal and normal.  + tinels sign on right, Neg Phalans.   Impression & Recommendations:  Problem # 1:  * NARCOLEPSY Assessment Comment Only Will defer management to Neurology.  Recommended that she eat healthy, well balance meals and exercise.     Problem # 2:  CARPAL TUNNEL SYNDROME, RIGHT, MILD (ICD-354.0) Assessment: New Recommended that she purchase a right wrist brace, and use short term NSAIDS.  Problem # 3:  LIBIDO, DECREASED (ICD-799.81) Assessment: Comment Only Likely due to pristique.  Pt wishes to continue pristique as her depression is currently well controlled.  Complete Medication List: 1)  Pristiq 100 Mg Xr24h-tab (Desvenlafaxine succinate) .... Take two by mouth at bedtime 2)  Klonopin 0.5 Mg Tabs (Clonazepam) .... Take 1 by mouth at bedtime and as needed 3)  Zofran 4 Mg Tabs (Ondansetron hcl) .... One very 6 hours as needed 4)  Omeprazole 20 Mg Tbec (Omeprazole) .... One tablet by mouth daily 5)  Requip 3 Mg Tabs (Ropinirole hcl) .Marland KitchenMarland KitchenMarland Kitchen  1 tab nightly 6)  Keppra Xr 750 Mg Xr24h-tab (Levetiracetam) .... One tablet by mouth three times a day 7)  Simvastatin 10 Mg Tabs (Simvastatin) .... One tab by mouth daily in the evening 8)  Lomotil 2.5-0.025 Mg Tabs (Diphenoxylate-atropine) .... One to two tablets by mouth every 6 hours as needed for diarrhea 9)  Zyrtec Allergy 10 Mg Caps (Cetirizine hcl) .... One tablet by mouth daily 10)  Nuvigil 250 Mg Tabs (Armodafinil) .Marland Kitchen.. 1- 1 1/2 tablet by mouth  every morning 11)  Maxalt-mlt 10 Mg Tbdp (Rizatriptan benzoate) .... One tablet under tongue as needed for  migraine.  may repeat in 2 hours once if symptoms persist 12)  Lisinopril 5 Mg Tabs (Lisinopril) .... One tablet by mouth once daily 13)  Vicodin 5-500 Mg Tabs (Hydrocodone-acetaminophen) .... One tablet by mouth every 6 hours as needed for severe headache 14)  Womens Multivitamin Plus Tabs (Multiple vitamins-minerals) .... One tablet by mouth daily  Patient Instructions: 1)  Please purchase a wrist brace for your right wrist.  2)  You may use aleve 220mg  by mouth twice daily for the next 1 week, then on an as needed basis. 3)  Follow up in 2 months.   Orders Added: 1)  Est. Patient Level II [16109]    Current Allergies (reviewed today): ! REGLAN ! * IV PHENERGAN ! * EGGS PENICILLIN

## 2010-04-23 LAB — COMPREHENSIVE METABOLIC PANEL
ALT: 65 U/L — ABNORMAL HIGH (ref 0–35)
Alkaline Phosphatase: 68 U/L (ref 39–117)
CO2: 23 mEq/L (ref 19–32)
Glucose, Bld: 173 mg/dL — ABNORMAL HIGH (ref 70–99)
Potassium: 3.7 mEq/L (ref 3.5–5.1)
Sodium: 139 mEq/L (ref 135–145)
Total Protein: 6.2 g/dL (ref 6.0–8.3)

## 2010-04-23 LAB — URINE CULTURE

## 2010-04-23 LAB — CBC
HCT: 37.7 % (ref 36.0–46.0)
Hemoglobin: 13.1 g/dL (ref 12.0–15.0)
RBC: 4.55 MIL/uL (ref 3.87–5.11)
RDW: 13.3 % (ref 11.5–15.5)
WBC: 5.1 10*3/uL (ref 4.0–10.5)

## 2010-04-23 LAB — URINALYSIS, ROUTINE W REFLEX MICROSCOPIC
Nitrite: NEGATIVE
Protein, ur: NEGATIVE mg/dL
Specific Gravity, Urine: 1.009 (ref 1.005–1.030)
Urobilinogen, UA: 0.2 mg/dL (ref 0.0–1.0)

## 2010-04-23 LAB — GLUCOSE, CAPILLARY: Glucose-Capillary: 181 mg/dL — ABNORMAL HIGH (ref 70–99)

## 2010-04-23 LAB — DIFFERENTIAL
Basophils Relative: 1 % (ref 0–1)
Eosinophils Absolute: 0.1 10*3/uL (ref 0.0–0.7)
Monocytes Absolute: 0.3 10*3/uL (ref 0.1–1.0)
Monocytes Relative: 6 % (ref 3–12)
Neutrophils Relative %: 63 % (ref 43–77)

## 2010-04-25 NOTE — Letter (Signed)
Summary: [facility]-Phone Note  [facility]-Phone Note   Imported By: Maryln Gottron 04/17/2010 09:58:32  _____________________________________________________________________  External Attachment:    Type:   Image     Comment:   External Document

## 2010-04-26 LAB — CBC
HCT: 42.6 % (ref 36.0–46.0)
Hemoglobin: 14.6 g/dL (ref 12.0–15.0)
MCHC: 34.2 g/dL (ref 30.0–36.0)
MCHC: 34.4 g/dL (ref 30.0–36.0)
MCV: 86.7 fL (ref 78.0–100.0)
Platelets: 164 10*3/uL (ref 150–400)
RBC: 4.16 MIL/uL (ref 3.87–5.11)
RDW: 14.1 % (ref 11.5–15.5)
RDW: 14.4 % (ref 11.5–15.5)

## 2010-04-26 LAB — POCT I-STAT, CHEM 8
Calcium, Ion: 1.11 mmol/L — ABNORMAL LOW (ref 1.12–1.32)
Creatinine, Ser: 0.8 mg/dL (ref 0.4–1.2)
Hemoglobin: 15 g/dL (ref 12.0–15.0)
Sodium: 138 mEq/L (ref 135–145)
TCO2: 25 mmol/L (ref 0–100)

## 2010-04-26 LAB — URINALYSIS, ROUTINE W REFLEX MICROSCOPIC
Bilirubin Urine: NEGATIVE
Glucose, UA: NEGATIVE mg/dL
Hgb urine dipstick: NEGATIVE
Ketones, ur: NEGATIVE mg/dL
Protein, ur: NEGATIVE mg/dL

## 2010-04-26 LAB — DIFFERENTIAL
Basophils Absolute: 0 10*3/uL (ref 0.0–0.1)
Basophils Absolute: 0 10*3/uL (ref 0.0–0.1)
Basophils Relative: 0 % (ref 0–1)
Basophils Relative: 0 % (ref 0–1)
Eosinophils Absolute: 0.1 10*3/uL (ref 0.0–0.7)
Eosinophils Relative: 1 % (ref 0–5)
Monocytes Absolute: 0.4 10*3/uL (ref 0.1–1.0)
Monocytes Relative: 4 % (ref 3–12)
Neutro Abs: 4.4 10*3/uL (ref 1.7–7.7)
Neutrophils Relative %: 74 % (ref 43–77)

## 2010-04-26 LAB — BASIC METABOLIC PANEL
BUN: 10 mg/dL (ref 6–23)
CO2: 24 mEq/L (ref 19–32)
CO2: 27 mEq/L (ref 19–32)
Calcium: 8.1 mg/dL — ABNORMAL LOW (ref 8.4–10.5)
Chloride: 104 mEq/L (ref 96–112)
Chloride: 108 mEq/L (ref 96–112)
Creatinine, Ser: 0.85 mg/dL (ref 0.4–1.2)
GFR calc Af Amer: >60 mL/min (ref >60)
Glucose, Bld: 104 mg/dL — ABNORMAL HIGH (ref 70–99)
Potassium: 4 mEq/L (ref 3.5–5.1)
Sodium: 137 mEq/L (ref 135–145)

## 2010-04-26 LAB — HEPATIC FUNCTION PANEL
ALT: 54 U/L — ABNORMAL HIGH (ref 0–35)
Bilirubin, Direct: 0.2 mg/dL (ref 0.0–0.3)
Indirect Bilirubin: 0.8 mg/dL (ref 0.3–0.9)
Total Bilirubin: 1 mg/dL (ref 0.3–1.2)

## 2010-04-26 LAB — LIPASE, BLOOD: Lipase: 22 U/L (ref 11–59)

## 2010-04-26 LAB — CARBAMAZEPINE LEVEL, TOTAL: Carbamazepine Lvl: 3.4 ug/mL — ABNORMAL LOW (ref 4.0–12.0)

## 2010-04-28 ENCOUNTER — Encounter: Payer: Self-pay | Admitting: Family

## 2010-04-28 ENCOUNTER — Ambulatory Visit (INDEPENDENT_AMBULATORY_CARE_PROVIDER_SITE_OTHER): Payer: Self-pay | Admitting: Family

## 2010-04-28 VITALS — BP 108/78 | HR 60 | Temp 98.0°F | Resp 16 | Ht 66.5 in | Wt 195.1 lb

## 2010-04-28 DIAGNOSIS — G5600 Carpal tunnel syndrome, unspecified upper limb: Secondary | ICD-10-CM

## 2010-04-28 NOTE — Patient Instructions (Signed)
You will be contacted about your referral to Dr. Norton Blizzard.

## 2010-04-28 NOTE — Progress Notes (Signed)
Subjective:    Patient ID: [name], female    DOB: Feb 15, 1975, 35 y.o.   MRN: [id number]  HPI  Carpal Tunnel Syndrome Patient presents for evaluation of hand paresthesias. Onset of the symptoms was 2 months ago. Current symptoms include: pain involving the right hand/wrist. Aggravating factors: work related keyboarding. Symptoms have not improved or worsened.. Evaluation to date: none. Treatment to date: OTC analgesics, which has been ineffective. She is also using a right hand brace.  Review of Systems + numbness, tingling and pain of right hand.  Weakened hand grasp right hand.    No past medical history on file.  History   Social History  . Marital Status: Married    Spouse Name: N/A    Number of Children: 2  . Years of Education: N/A   Occupational History  . Unemployed     Completed 12th grade   Social History Main Topics  . Smoking status: Former Games developer  . Smokeless tobacco: Never Used  . Alcohol Use: No  . Drug Use: No  . Sexually Active: Not on file   Other Topics Concern  . Not on file   Social History Narrative  . No narrative on file    No past surgical history on file.  No family history on file.  Allergies  Allergen Reactions  . Eggs Or Egg-Derived Products     REACTION: possible egg allergy- stomach pain, nausea/vomitting  . Metoclopramide Hcl     REACTION: Lactation  . Penicillins     REACTION: yeast infection    Current Outpatient Prescriptions on File Prior to Visit  Medication Sig Dispense Refill  . Armodafinil (NUVIGIL) 250 MG tablet Take 250 mg by mouth. Take 1 -1 1/2 tablets by mouth every morning.       . cetirizine (ZYRTEC) 10 MG tablet Take 10 mg by mouth daily.        . clonazePAM (KLONOPIN) 0.5 MG tablet Take 0.5 mg by mouth at bedtime as needed.        . diphenoxylate-atropine (LOMOTIL) 2.5-0.025 MG per tablet Take 1 tablet by mouth. Take 1 - 2 tablets by mouth every 6 hours as needed for diarrhea.       . ferrous sulfate  325 (65 FE) MG tablet Take 325 mg by mouth daily.        . fish oil-omega-3 fatty acids 1000 MG capsule Take 1 g by mouth 2 (two) times daily.        Marland Kitchen levETIRAcetam (KEPPRA) 750 MG tablet Take 750 mg by mouth 3 (three) times daily.        . Multiple Vitamins-Minerals (WOMENS MULTI VITAMIN & MINERAL) TABS Take 1 tablet by mouth daily.        Marland Kitchen omeprazole (PRILOSEC) 20 MG capsule Take 20 mg by mouth daily.        . ondansetron (ZOFRAN) 4 MG tablet Take 4 mg by mouth every 6 (six) hours as needed.        Marland Kitchen rOPINIRole (REQUIP) 3 MG tablet Take 3 mg by mouth at bedtime.        . simvastatin (ZOCOR) 10 MG tablet Take 10 mg by mouth at bedtime.        Marland Kitchen DISCONTD: amphetamine-dextroamphetamine (ADDERALL) 30 MG tablet Take 30 mg by mouth daily.          BP 108/78  Pulse 60  Temp(Src) 98 F (36.7 C) (Oral)  Resp 16  Ht 5' 6.5" (1.689 m)  [employer]  195 lb 1.3 oz (88.488 kg)  BMI 31.02 kg/m2    Objective:   Physical Exam  Constitutional: She appears well-developed and well-nourished.  Cardiovascular: Normal rate and regular rhythm.   Pulmonary/Chest: No apnea. No respiratory distress.  Neurological:       A slightly diminished hand grasp noted on right. Positive Phalen's test right and positive Tinel's test right          Assessment & Plan:

## 2010-05-01 ENCOUNTER — Encounter: Payer: Self-pay | Admitting: Family Medicine

## 2010-05-01 ENCOUNTER — Ambulatory Visit (INDEPENDENT_AMBULATORY_CARE_PROVIDER_SITE_OTHER): Payer: Self-pay | Admitting: Family Medicine

## 2010-05-01 DIAGNOSIS — G5600 Carpal tunnel syndrome, unspecified upper limb: Secondary | ICD-10-CM

## 2010-05-01 DIAGNOSIS — M25531 Pain in right wrist: Secondary | ICD-10-CM

## 2010-05-01 DIAGNOSIS — G5601 Carpal tunnel syndrome, right upper limb: Secondary | ICD-10-CM

## 2010-05-01 DIAGNOSIS — M25539 Pain in unspecified wrist: Secondary | ICD-10-CM

## 2010-05-01 NOTE — Patient Instructions (Signed)
You have carpal tunnel syndrome. Wear cock-up wrist splint at nighttime and as often as possible during the day Take aleve 2 tabs twice a day for pain and inflammation. We will arrange for you to have nerve conduction studies of this arm to assess for probability of carpal tunnel syndrome. A steroid injection is a consideration to help with pain and inflammation if this is confirmed. An oral steroid course (prednisone) is also an option though this has more side effects. I will contact you following the results of the testing on where we go next and when you should come back to see me.

## 2010-05-01 NOTE — Assessment & Plan Note (Signed)
Patient describes wrist pain more on palmar aspect with numbness going into thumb.  Also reports some symptoms into forearm as well.  Most likely represents carpal tunnel syndrome though an element of radial tunnel syndrome or tendinopathy also possible.  Cock-up wrist splint provided today (hers is inadequate without enough support), aleve twice a day with food (caution with gastric history - tolerates ibuprofen).  Nerve conduction studies to further assess.  Will contact her after these results.  Handout provided regarding carpal tunnel syndrome.

## 2010-05-01 NOTE — Progress Notes (Signed)
  Subjective:    Patient ID: [name], female    DOB: 10-07-1975, 35 y.o.   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 + numbness in tingling and dorsal wrist into right thumb as well No forearm numbness Bought a wrist brace in past week and taking ibuprofen without much help Once had similar symptoms years ago that went away on own. Pain worse in morning and with motions of wrist. No problems with left wrist.  Review of Systems See HPI above.    Objective:   Physical Exam R elbow: No gross deformity, swelling, bruising. No focal TTP medial or lateral elbow, or antecubital fossa. FROM without pain. Strength 5/5 elbow flexion and extension Negative tinels cubital tunnel and radial tunnel. Collateral ligaments intact  R wrist/hand: No gross deformity, swelling, bruising, or atrophy. Mild TTP volar wrist over carpal tunnel.  Less ttp within distal forarm radial aspect volarly.  No focal bony TTP. FROM wrist and fingers. Equivocal phalens (pain but no numbness).  Tinels at carpal tunnel with numbness into thumb.  Negative at guyons canal. Strength 5-/5 with thumb opposition, 5/5 with finger abduction and grip (pain with grip). Sensation intact to light touch distally < 2 sec cap refill.       Assessment & Plan:   Problem List as of 05/01/2010        HYPERLIPIDEMIA   IRON DEFICIENCY   DEPRESSION/ANXIETY   RESTLESS LEG SYNDROME   MIGRAINE, COMMON   GERD   GASTROPARESIS   IBS   FATTY LIVER DISEASE   OVARIAN CYST   SEIZURE DISORDER   HYPERGLYCEMIA, MILD   TRANSAMINASES, SERUM, ELEVATED   ANA POSITIVE   PHARYNGITIS   CARPAL TUNNEL SYNDROME, RIGHT, MILD   LIBIDO, DECREASED   Depression   Right  wrist pain   Last Assessment & Plan Note   05/01/2010 Office Visit Signed 05/01/2010  2:52 PM by Norton Blizzard, MD    Patient describes wrist pain more on palmar aspect with numbness going into thumb.  Also reports some symptoms into forearm as well.  Most likely represents carpal tunnel syndrome though an element of radial tunnel syndrome or tendinopathy also possible.  Cock-up wrist splint provided today (hers is inadequate without enough support), aleve twice a day with food (caution with gastric history - tolerates ibuprofen).  Nerve conduction studies to further assess.  Will contact her after these results.  Handout provided regarding carpal tunnel syndrome.

## 2010-05-14 LAB — BASIC METABOLIC PANEL
Calcium: 9 mg/dL (ref 8.4–10.5)
Creatinine, Ser: 0.7 mg/dL (ref 0.4–1.2)
GFR calc Af Amer: >60 mL/min (ref >60)
GFR calc non Af Amer: >60 mL/min (ref >60)
Sodium: 139 mEq/L (ref 135–145)

## 2010-05-15 LAB — RAPID URINE DRUG SCREEN, HOSP PERFORMED
Amphetamines: NOT DETECTED
Barbiturates: POSITIVE — AB
Benzodiazepines: NOT DETECTED
Cocaine: NOT DETECTED
Opiates: POSITIVE — AB
Tetrahydrocannabinol: NOT DETECTED

## 2010-05-15 LAB — URINALYSIS, ROUTINE W REFLEX MICROSCOPIC
Bilirubin Urine: NEGATIVE
Glucose, UA: NEGATIVE mg/dL
Hgb urine dipstick: NEGATIVE
Ketones, ur: NEGATIVE mg/dL
Nitrite: NEGATIVE
Protein, ur: NEGATIVE mg/dL
Specific Gravity, Urine: 1.005 (ref 1.005–1.030)
Urobilinogen, UA: 1 mg/dL (ref 0.0–1.0)
pH: 7 (ref 5.0–8.0)

## 2010-05-15 LAB — CBC
HCT: 36.1 % (ref 36.0–46.0)
Hemoglobin: 12.7 g/dL (ref 12.0–15.0)
MCHC: 34.1 g/dL (ref 30.0–36.0)
Platelets: 172 10*3/uL (ref 150–400)
RBC: 4.35 MIL/uL (ref 3.87–5.11)
RDW: 14.3 % (ref 11.5–15.5)
WBC: 5.1 10*3/uL (ref 4.0–10.5)

## 2010-05-15 LAB — COMPREHENSIVE METABOLIC PANEL WITH GFR
Albumin: 3.1 g/dL — ABNORMAL LOW (ref 3.5–5.2)
BUN: 6 mg/dL (ref 6–23)
Calcium: 8.5 mg/dL (ref 8.4–10.5)
Creatinine, Ser: 0.69 mg/dL (ref 0.4–1.2)
Glucose, Bld: 111 mg/dL — ABNORMAL HIGH (ref 70–99)
Total Protein: 5.8 g/dL — ABNORMAL LOW (ref 6.0–8.3)

## 2010-05-15 LAB — DIFFERENTIAL
Basophils Absolute: 0 10*3/uL (ref 0.0–0.1)
Basophils Relative: 0 % (ref 0–1)
Lymphocytes Relative: 30 % (ref 12–46)
Monocytes Absolute: 0.3 10*3/uL (ref 0.1–1.0)
Monocytes Relative: 6 % (ref 3–12)
Neutro Abs: 3.7 10*3/uL (ref 1.7–7.7)
Neutrophils Relative %: 64 % (ref 43–77)

## 2010-05-15 LAB — BASIC METABOLIC PANEL
BUN: 2 mg/dL — ABNORMAL LOW (ref 6–23)
BUN: 3 mg/dL — ABNORMAL LOW (ref 6–23)
Calcium: 8.2 mg/dL — ABNORMAL LOW (ref 8.4–10.5)
Calcium: 8.6 mg/dL (ref 8.4–10.5)
GFR calc non Af Amer: >60 mL/min (ref >60)
GFR calc non Af Amer: >60 mL/min (ref >60)
Potassium: 3.5 mEq/L (ref 3.5–5.1)
Potassium: 4 mEq/L (ref 3.5–5.1)
Sodium: 142 mEq/L (ref 135–145)

## 2010-05-15 LAB — POCT I-STAT, CHEM 8
BUN: 6 mg/dL (ref 6–23)
Calcium, Ion: 1.13 mmol/L (ref 1.12–1.32)
Chloride: 104 mEq/L (ref 96–112)
Creatinine, Ser: 0.7 mg/dL (ref 0.4–1.2)
Glucose, Bld: 114 mg/dL — ABNORMAL HIGH (ref 70–99)
HCT: 36 % (ref 36.0–46.0)
Hemoglobin: 12.2 g/dL (ref 12.0–15.0)
Potassium: 3.1 mEq/L — ABNORMAL LOW (ref 3.5–5.1)
Sodium: 141 meq/L (ref 135–145)
TCO2: 25 mmol/L (ref 0–100)

## 2010-05-15 LAB — COMPREHENSIVE METABOLIC PANEL
ALT: 37 U/L — ABNORMAL HIGH (ref 0–35)
AST: 21 U/L (ref 0–37)
Alkaline Phosphatase: 67 U/L (ref 39–117)
CO2: 28 mEq/L (ref 19–32)
Calcium: 9.1 mg/dL (ref 8.4–10.5)
Chloride: 106 mEq/L (ref 96–112)
Chloride: 107 mEq/L (ref 96–112)
Creatinine, Ser: 0.67 mg/dL (ref 0.4–1.2)
GFR calc Af Amer: >60 mL/min (ref >60)
GFR calc Af Amer: >60 mL/min (ref >60)
GFR calc non Af Amer: >60 mL/min (ref >60)
GFR calc non Af Amer: >60 mL/min (ref >60)
Potassium: 2.9 mEq/L — ABNORMAL LOW (ref 3.5–5.1)
Potassium: 3.8 mEq/L (ref 3.5–5.1)
Sodium: 138 mEq/L (ref 135–145)
Sodium: 139 mEq/L (ref 135–145)
Total Bilirubin: 0.5 mg/dL (ref 0.3–1.2)

## 2010-05-15 LAB — LIPASE, BLOOD
Lipase: 21 U/L (ref 11–59)
Lipase: 23 U/L (ref 11–59)

## 2010-05-15 LAB — CLOTEST (H. PYLORI), BIOPSY: Helicobacter screen: NEGATIVE

## 2010-05-15 LAB — MAGNESIUM: Magnesium: 1.9 mg/dL (ref 1.5–2.5)

## 2010-05-15 LAB — POCT PREGNANCY, URINE: Preg Test, Ur: NEGATIVE

## 2010-06-13 ENCOUNTER — Ambulatory Visit (INDEPENDENT_AMBULATORY_CARE_PROVIDER_SITE_OTHER): Payer: Self-pay | Admitting: Family

## 2010-06-13 ENCOUNTER — Encounter: Payer: Self-pay | Admitting: Family

## 2010-06-13 VITALS — BP 124/82 | HR 78 | Temp 97.8°F | Resp 16 | Ht 66.5 in | Wt 194.0 lb

## 2010-06-13 DIAGNOSIS — E785 Hyperlipidemia, unspecified: Secondary | ICD-10-CM

## 2010-06-13 DIAGNOSIS — F329 Major depressive disorder, single episode, unspecified: Secondary | ICD-10-CM

## 2010-06-13 DIAGNOSIS — R569 Unspecified convulsions: Secondary | ICD-10-CM

## 2010-06-13 DIAGNOSIS — M25531 Pain in right wrist: Secondary | ICD-10-CM

## 2010-06-13 DIAGNOSIS — F32A Depression, unspecified: Secondary | ICD-10-CM

## 2010-06-13 DIAGNOSIS — M25539 Pain in unspecified wrist: Secondary | ICD-10-CM

## 2010-06-13 LAB — LIPID PANEL
Cholesterol: 204 mg/dL — ABNORMAL HIGH (ref 0–200)
Triglycerides: 205 mg/dL — ABNORMAL HIGH (ref <150)

## 2010-06-13 NOTE — Progress Notes (Signed)
Subjective:    Patient ID: [name], female    DOB: 30-Mar-1975, 35 y.o.   MRN: [id number]  HPI  [name] is a 35 year old female who presents today for followup.  Seizures- husband notes 3 seizures in the last 1 month.  All while she was sleeping, lasted about 1 minute each per husband.  She is followed by Dr. Richardean Chimera.   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  CTS- she reports that she was seen by Dr. Pearletha Forge, who recommended NSAIDs and a referral for nerve conduction study. She was to have I nerve conduction study performed at [facility]. She was unable to schedule this as she is currently self pay, and they are not within the [facility] system. She now has [employer] our insurance. She notes that she continues to have pain in the right hand and forearm, despite use of brace. She reports that she was unable to tolerate NSAIDs due to GI upset.  Depression-  She reports that her mood is improved since school ended.  Klonopin- only using sparingly.  Geographical tongue-  Notes + tongue burning after brushing teeth.  Review of Systems See history present illness  Past Medical History  Diagnosis Date  . Seizures   . Narcolepsy   . Restless leg   . Depression   . Hyperlipidemia     History   Social History  . Marital Status: Married    Spouse Name: N/A    Number of Children: 2  . Years of Education: N/A   Occupational History  . Unemployed     Completed 12th grade   Social History Main Topics  . Smoking status: Never Smoker   . Smokeless tobacco: Never Used  . Alcohol Use: No  . Drug Use: No  . Sexually Active: Not on file   Other Topics Concern  . Not on file   Social History Narrative  . No narrative on file    No past surgical history on file.  Family History  Problem Relation Age of Onset  . Heart attack Mother   . Diabetes Father   . Heart attack Father     Allergies  Allergen Reactions    . Eggs Or Egg-Derived Products     REACTION: possible egg allergy- stomach pain, nausea/vomitting  . Metoclopramide Hcl     REACTION: Lactation  . Penicillins     REACTION: yeast infection    Current Outpatient Prescriptions on File Prior to Visit  Medication Sig Dispense Refill  . Armodafinil (NUVIGIL) 250 MG tablet Take 250 mg by mouth. Take 1 -1 1/2 tablets by mouth every morning.       . clonazePAM (KLONOPIN) 0.5 MG tablet Take 0.5 mg by mouth at bedtime as needed.        . levETIRAcetam (KEPPRA) 750 MG tablet Take 750 mg by mouth 3 (three) times daily.        . Multiple Vitamins-Minerals (WOMENS MULTI VITAMIN & MINERAL) TABS Take 1 tablet by mouth daily.        Marland Kitchen omeprazole (PRILOSEC) 20 MG capsule Take 20 mg by mouth daily.        . ondansetron (ZOFRAN) 4 MG tablet Take 4 mg by mouth every 6 (six) hours as needed.        Marland Kitchen rOPINIRole (REQUIP) 3 MG tablet Take 3 mg by mouth at bedtime.        . cetirizine (ZYRTEC) 10 MG tablet Take  10 mg by mouth daily.        . diphenoxylate-atropine (LOMOTIL) 2.5-0.025 MG per tablet Take 1 tablet by mouth. Take 1 - 2 tablets by mouth every 6 hours as needed for diarrhea.       . ferrous sulfate 325 (65 FE) MG tablet Take 325 mg by mouth daily.        . fish oil-omega-3 fatty acids 1000 MG capsule Take 1 g by mouth 2 (two) times daily.        . simvastatin (ZOCOR) 10 MG tablet Take 10 mg by mouth at bedtime.          BP 124/82  Pulse 78  Temp(Src) 97.8 F (36.6 C) (Oral)  Resp 16  Ht 5' 6.5" (1.689 m)  Wt 194 lb 0.6 oz (88.016 kg)  BMI 30.85 kg/m2       Objective:   Physical Exam General: Awake and alert and in no acute distress Cardiovascular: S1-S2 regular rate and rhythm no murmurs noted Respiratory: Breath sounds are clear to auscultation bilaterally without wheezes rales or rhonchi, no increased work of breathing Musculoskeletal: Positive tenderness to palpation of the palmar surface of right hand at base of palm, positive  Tinel's sign with tingling. Psychiatric: Alert, oriented x3. Calm and pleasant. Does not appear depressed or anxious.       Assessment & Plan:

## 2010-06-13 NOTE — Assessment & Plan Note (Signed)
This appears clinically stable we'll continue to monitor.

## 2010-06-13 NOTE — Assessment & Plan Note (Signed)
This is being managed by neurology, she is continued on Keppra.

## 2010-06-13 NOTE — Assessment & Plan Note (Signed)
Will check a fasting lipid panel today.

## 2010-06-13 NOTE — Patient Instructions (Signed)
Please complete your lab work on the first floor. Follow up in 3 months.  

## 2010-06-13 NOTE — Assessment & Plan Note (Signed)
Will discuss with Dr. Pearletha Forge further recommendations, and see if he knows of anyone within the health system who could perform the EMGs. Off hand I am unaware of any providers within our system to do so.

## 2010-06-14 ENCOUNTER — Encounter: Payer: Self-pay | Admitting: Family

## 2010-06-20 NOTE — H&P (Signed)
[name], [name]               ACCOUNT NO.:  [id number]   MEDICAL RECORD NO.:  [id number]          PATIENT TYPE:  EMS   LOCATION:  ED                            FACILITY:  [facility]   PHYSICIAN:  Dorris Singh, DO    DATE OF BIRTH:  [birth year]   DATE OF ADMISSION:  09/01/2007  DATE OF DISCHARGE:  LH                              HISTORY & PHYSICAL   The patient is a 35 year old Caucasian female who presented to the [facility] with a history of seizure-like activity.  Apparently  per her husband she was at home, came out of the bathroom and started to  shake and when she fell on the floor apparently her hands were across  her body and she was having blood out of her mouth like she had bit her  tongue.  He called EMS, EMS told her to put her on her back.  He was  concerned because she still had blood coming out of her mouth and he put  her on the side.  EMS came, at that point in time patient was combative,  they had a hard time putting her on a stretcher but then they were able  to get her to sit.  While she was in the ambulance they tried to do a IV  on her and she pulled that out.  Patient states that she remembers some  of the recollection of the ride over there but does not remember  anything up until that point.  She has never had a seizure history  before.   PAST MEDICAL HISTORY:  1. Anxiety/depression.  2. Restless leg syndrome.   SURGICAL HISTORY:  She has had hysterectomy.   SOCIAL HISTORY:  She is married, she is nonsmoker, nondrinker and no  drug abuse.  She has NO KNOWN DRUG ALLERGIES.   She is currently taking:  1. Iron once a day.  2. Metoclopramide 10 mg four times a day.  3. Bupropion 150 mg once a day which she claims that she has not had.  4. Clonazepam 0.5 three times a day.  5. Requip 2 mg dose.   REVIEW OF SYSTEMS:  Negative for weight loss, weakness, appetite change  or chills.  EYES:  Negative for eye pain or changes in vision.  EARS,  NOSE MOUTH AND THROAT:  Negative for ear pain, hearing loss or  hoarseness.  CARDIOVASCULAR:  Negative for chest pain, palpitations or  bradycardia.  RESPIRATORY:  Negative for cough, dyspnea or wheezing.  GASTROINTESTINAL:  Negative for nausea, vomiting, diarrhea.  GU:  Negative for dysuria or dribbling.  MUSCULOSKELETAL:  Negative for  arthralgias, arthritis or back pain.  SKIN:  Negative for pruritus, rash  or abrasions.  NEURO:  Positive for confusion and altered mental status.  METABOLIC:  Negative for excessive thirst or cold intolerance.  HEMATOLOGIC:  Negative for anemia.   VITALS:  Blood pressure 100/71, pulse rate 71, respirations 18.  GENERALLY:  The patient is a 35 year old Caucasian female who is well-  developed, well-nourished, no acute distress.  HEART:  Regular rate and rhythm.  HEAD:  Normocephalic/atraumatic.  EYES:  EOMI, PERRLA.  EARS:  TMs visualized bilaterally.  NOSE:  Turbinates moist.  THROAT:  There is some mild lacerations to her tongue or just redness to  her tongue.  NECK:  Supple, no lymphadenopathy or thyromegaly.  CARDIOVASCULAR:  Regular rate and rhythm, no murmurs, rubs or gallops.  CHEST:  Clear to auscultation bilaterally.  ABDOMEN:  Soft, nontender, nondistended.  EXTREMITIES:  Positive pulses, full range of motion, no edema,  ecchymosis or cyanosis.  Cranial nerves II-XII grossly intact, A plus O x3, there is no  posticteric sensation happenings.   Her urine was negative, her basic metabolic panel was all within normal  limits except for glucose of 122 and her white count was 5.8, hemoglobin  13.1, hematocrit 37.9, and platelet count of 184.  Her CT that was done  was also negative showing no active intracranial process.   ASSESSMENT/PLAN:  New onset seizures, restless leg syndrome.  Will go  ahead and admit patient to the service of [facility], will get Dr.  Gerilyn Pilgrim to consult and participate.  She has an EEG scheduled for  tomorrow  morning.  Will put her on normal saline IV, will also order  several labs in the morning including an EtOH level and urine drug  screen, prolactin and CPK.  Will do DVT and GI prophylaxis and place her  on Requip at night.  Will continue to monitor and change therapy as  necessary.  She will be under 23-hour observation.      Dorris Singh, DO  Electronically Signed     CB/MEDQ  D:  09/01/2007  T:  09/01/2007  Job:  [id number]

## 2010-06-20 NOTE — Consult Note (Signed)
[name], [name]               ACCOUNT NO.:  [id number]   MEDICAL RECORD NO.:  [id number]          PATIENT TYPE:  OBV   LOCATION:  A310                          FACILITY:  [facility]   PHYSICIAN:  Kofi A. Gerilyn Pilgrim, M.D. DATE OF BIRTH:  [birth year]   DATE OF CONSULTATION:  09/02/2007  DATE OF DISCHARGE:                                 CONSULTATION   REASON FOR CONSULTATION:  Seizure.   HISTORY:  The patient is a 35 year old white female who presented with  what appears to be generalized tonic-clonic seizures.  The husband  reports that he witnessed the patient, stiffening up with her arms  towards her chest, and having clonic activity.  The patient had a single  event that lasted for a few minutes.  She then stopped for a couple of  minutes and then had another event.  She did bite the inside of her  tongue on both sides and it was bleeding.  No urinary incontinence was  reported.  No family history of seizures reported.  No personal history  of seizures reported. The medical history was significant for anxiety  disorder with depression and restless leg syndrome.   PAST SURGICAL HISTORY:  Hysterectomy.   SOCIAL HISTORY:  She is married.  She is a nonsmoker.  No significant  alcohol use.  No illicit drug use.   ALLERGIES:  None known.   ADMISSION MEDICATION:  1. Iron.  2. Metoclopramide 10 mg 4x a day.  3. Wellbutrin 150 mg (apparently she was not taking this).  4. Requip 2 mg.  5. Clonazepam 0.5 mg t.i.d.   REVIEW OF SYSTEMS:  The patient does report having a significant  headache afterwards.  She did have postictal confusion and lethargy but  she has recovered and appears to be back to baseline.  She also reports  injuring the left elbow during this seizure, and does have some low back  pain, but this is new.  She does report having right leg weakness mild  at baseline, unclear etiology.   FAMILY HISTORY:  Unremarkable.   PHYSICAL EXAMINATION:  GENERAL:  Shows a mildly  overweight pleasant lady  in no acute distress.  VITAL SIGNS:  Blood pressure 103/65, pulse 64, respirations 20,  temperature 98.2.  HEENT:  She does have bruises bilaterally involving both sides of the  tongue.  They are superficial.  NECK:  Supple.  ABDOMEN:  Soft.  EXTREMITIES:  No significant edema or varicosities.  She had a  superficial bruise involving the left elbow.  MENTATION:  She is awake and alert.  She converses well.  Speech is  normal, language and cognition are also intact.  Cranial nerve  evaluation:  Pupils are equal, round, and reacted to light.  The visual  fields are intact and extraocular movements are full.  Facial muscle  strength is symmetric.  Tongue is midline, uvula midline.  Shoulder  shrugs are normal.  Motor examination shows normal tone, bulk and  strength.  There is no pronator drift  involving the upper extremities.  She has a mild right leg proximal weakness on hip  flexion 4+/5-  otherwise strength is normal in the legs, bulk and tone are also normal  in the legs.  Reflexes are symmetrically downgoing plantar responses.  Coordination shows no dysmetria, no tremors, no parkinsonism.  Sensation  normal to light touch and pain.   SUPPORTIVE DATA:  Head CT scan of the brain shows no significant  findings.  Urinalysis negative.  Sodium 140, potassium 3.7, chloride  108, CO2 26, BUN 10, creatinine 0.7, glucose 122, calcium 9.1.  WBC 5.8,  hemoglobin 13.1, platelet count of 184.  Ethanol undetected.   ASSESSMENT:  A single bout of unprovoked seizure.   RECOMMENDATIONS:  An EEG, MRI of the brain.  I would not treat at this  point in time until we have more information.  Seizure precautions  discussed with the patient and her husband. She is not to drive until  further information is obtained.   Thanks for this consultation.      Kofi A. Gerilyn Pilgrim, M.D.  Electronically Signed     KAD/MEDQ  D:  09/03/2007  T:  09/03/2007  Job:  [id number]

## 2010-06-20 NOTE — Discharge Summary (Signed)
[name], [name]               ACCOUNT NO.:  [id number]   MEDICAL RECORD NO.:  [id number]          PATIENT TYPE:  INP   LOCATION:  1520                         FACILITY:  [facility]   PHYSICIAN:  Marcellus Scott, MD     DATE OF BIRTH:  07-29-1975   DATE OF ADMISSION:  08/02/2008  DATE OF DISCHARGE:  08/05/2008                               DISCHARGE SUMMARY   PRIMARY MEDICAL DOCTOR:  Dr. Franchot Heidelberg at [facility] in [place], [place].   DISCHARGE DIAGNOSES:  1. Intractable nausea, vomiting, epigastric pain, and diarrhea,      resolved. ? secondary to viral gastroenteritis.  2. Extensive focal nodular gastritis by esophagogastroduodenoscopy.      CLOtest negative.  3. Seizure disorder.  4. Depression.   DISCHARGE MEDICATIONS:  1. Keppra 1500 mg p.o. q.h.s.  2. Pristiq 100 mg p.o. q.h.s.  3. Phenobarbital 15 mg p.o. q.h.s.  4. Fish oil 1 tablet p.o. q.h.s.  5. Requip 2 mg p.o. q.h.s.  6. Phenergan 25 mg p.o. q.6 hours p.r.n. for nausea.  7. Protonix 40 mg p.o. daily.  8. Reglan 5 mg p.o. b.i.d. for 2 weeks, then discontinue.  9. Klonopin 0.5 mg p.o. daily p.r.n.   PROCEDURES:  1. EGD with biopsy by Dr. Madilyn Fireman on June 30.  Impression:      a.     Apparent inflammatory process of the mid to proximal       stomach.      b.     Retained gastric contents.      c.     No evidence of mechanical outlet obstruction.   1. Ultrasound of the abdomen.  Impression:      a.     No gallstones.      b.     Fatty infiltration of the liver.  No ductal dilatation.      c.     Pancreas obscured by bowel gas.      d.     No hydronephrosis.   PERTINENT LABORATORIES:  CLOtest on biopsy was negative.  Lipase 23.  Comprehensive metabolic panel on June 30 unremarkable with BUN 5,  creatinine 0.67.  Total protein 5.9.  Albumin 3.3.  CBC:  Hemoglobin  12.4, hematocrit 36.1, white blood cells 5.1, platelets 172.  Urine drug  screen positive for barbiturates and  opiates.  Urine analysis negative.  Urine pregnancy test negative.   CONSULTATIONS:  Gastroenterology.  Dr. Madilyn Fireman from [facility].   HOSPITAL COURSE AND PATIENT DISPOSITION:  [name] is a 35 year old  Caucasian female patient with history of seizure disorder and depression  who presented to the emergency room with intractable nausea, vomiting,  epigastric pain, and diarrhea lasting 1 week's duration.  She had  recently been on an outdoor camping trip when she also had ice cream.  Symptoms started after this trip.  She was evaluated at [facility]  where CT abdomen and pelvis apparently were negative for acute findings.  She was treated symptomatically and discharged.  Despite this symptoms  persisted, and patient presented to the emergency room for  further  evaluation and management.  She was initially assessed to have viral  gastroenteritis and treated like that.   1. Intractable nausea, vomiting, epigastric pain, and diarrhea.  The      patient was admitted to medical floor.  She was placed on contact      isolation.  Although stool studies were ordered before the stool      studies were sent off, her diarrhea resolved.  Because of      persistent upper GI symptoms, GI was consulted.  They proceeded to      do an EGD with findings as above.  Yesterday she was started on      clear liquids.  Her PPIs were continued, and patient was placed on      Reglan because of some retained food in the stomach.  She has      tolerated the clear liquid diet.  Although she has minor nausea and      abdominal pain, she does not have any vomiting or diarrhea.  GI has      recommended advancing her diet slowly with full liquids at lunch      and regular diet at dinner, and if she tolerates that they have      cleared her for discharge home later this evening to continue PPI      and Reglan for 2 weeks.  She is to follow up with Dr. Madilyn Fireman as an      outpatient in 2-4 weeks.  All of this has been  explained to the      patient as well as her spouse.  She has been advised to abstain      from nonsteroidal anti-inflammatory drugs such as ibuprofen,      aspirin, Aleve.  She verbalizes understanding.  2. Seizure disorder.  The patient was switched to IV Keppra, and      continued on oral phenobarbital.  She has not had any seizures in      the hospital.  She is to continue her home seizure medications.  3. Depression.  Although patient has mildly flat affect, she does not      have any delusions or hallucinations or suicidal or homicidal      ideations.  She is to continue her home medications.   The patient at this time is stable, and if she tolerates her advanced  diet she should be stable for discharge later this evening.  She is to  call her primary MD's office for a followup appointment, and she has an  appointment to see Dr. Madilyn Fireman on August 24, 2008 at 4:15 p.m.      Marcellus Scott, MD  Electronically Signed     AH/MEDQ  D:  08/05/2008  T:  08/05/2008  Job:  [id number]

## 2010-06-20 NOTE — Consult Note (Signed)
[name], [name]               ACCOUNT NO.:  [id number]   MEDICAL RECORD NO.:  [id number]          PATIENT TYPE:  INP   LOCATION:  1520                         FACILITY:  [facility]   PHYSICIAN:  [name], M.D.    DATE OF BIRTH:  Jun 22, 1975   DATE OF CONSULTATION:  08/03/2008  DATE OF DISCHARGE:                                 CONSULTATION   We were asked to see [name] today in consultation for nausea,  vomiting, and epigastric pain with a negative ultrasound by Dr.  Toniann [name] of the [facility].   HPI:  This is a 35 year old female with a history of multiple abdominal  surgeries and slow GI transit who describes an episode of nausea,  vomiting, diarrhea that was set off by eating pizza approximately 10  days ago when she was staying in a hotel.  It resolved and symptoms  returned just a few days later when she was eating ice cream.  They  remained until this past Sunday night.  The symptoms were so severe the  patient went to the emergency department in [facility] and had a CT scan  that was reportedly negative.  They treated her with Phenergan and  Percocet, as well as Protonix.  The Protonix appeared to worsen her  diarrhea so she stopped taking it.  Patient tells me that typically her  bowel movements are slow.  She has one about every 3 days.  She has seen  no melena or hematochezia.  She also has occasional GERD, particularly  when eating pizza; Tums help this.  Recently she has been taking  increased amounts of ibuprofen.  She uses it regularly for her migraines  but because of an ankle sprain she was taking 800 mg t.i.d. for about a  week at the beginning of June.  When describing her abdominal pain, she  states that it started on the left and now has migrated to the center of  her abdomen between her ribs.  Her vomiting and diarrhea was severe  until 2 days ago Sunday night when she was admitted to [facility].  She has been on IV fluids and  clears since and she has had no  more vomiting or bowel movement; however, her epigastric pain has  persisted and is still severe but is better with Dilaudid.   PAST MEDICAL HISTORY:  Significant for:  1. Seizure disorder.  2. Depression.  3. Endometriosis.  4. Restless legs syndrome.  5. Migraines.   SURGERIES:  Include:  1. Tubal ligation.  2. Hysterectomy with single oophorectomy.  3. Appendectomy.  4. She has also had a laparoscopy with lysis of adhesions.   PRIMARY CARE PHYSICIAN:  [name] in [place], [place].   CURRENT MEDICATIONS:  Include:  1. Keppra.  2. Pristiq.  3. Phenobarbital.  4. Rective.  5. Phenergan.  6. Percocet.   SHE HAS NO KNOWN DRUG ALLERGIES.   REVIEW OF SYSTEMS:  Per HPI, otherwise negative.   SOCIAL HISTORY:  Negative for drugs, alcohol, and tobacco.  She is  married, has a  supportive family.   FAMILY HISTORY:  Positive for gallbladder disease in her sister and her  father.  Positive for ulcers in her sister.  Positive for intestinal  symptoms in her mother that sound like they could possibly be IBS.   PHYSICAL EXAM:  She is alert, oriented, in no apparent distress,  pleasant to talk to.  Temperature 98.  Pulse 78.  Respirations 18.  Blood pressure is 121/77.  HEART:  Has a regular rate and rhythm with no murmurs, rubs, or gallops.  LUNGS:  Clear to auscultation anteriorly.  ABDOMEN:  Soft, obese, mildly tender in the right lower quadrant.  She  has no bowel sounds on my exam.   LABS:  Show a hemoglobin of 12.4, hematocrit 36.1, white count 5.1,  platelets 172,000.  BMET is within normal limits other than a glucose of  122.  LFTs are within normal limits with a lipase of 21.  Stool cultures  are pending.  She had an abdominal ultrasound on August 03, 2008, that  showed no gallstones, a CBD at 2.5 mm, and a fatty liver, otherwise it  was within normal limits.   ASSESSMENT:  Dr. Dorena Cookey has seen and examined the  patient, collected  a history, and reviewed her chart.  His impression is patient seems to  have combination of nausea, vomiting, and epigastric pain, as well as  diarrhea suspicious for infectious etiology given 1-week duration and  diarrhea.  No stools available for culturing at this point.  Given  current preponderance of upper tract symptoms, we will proceed with  upper endoscopy in the morning.  Thanks very much for this consultation.      Emily Police, PA    ______________________________  [name] Madilyn [name], M.D.    MLY/MEDQ  D:  08/03/2008  T:  08/03/2008  Job:  [id number]   cc:   [name]. Madilyn [name], M.D.  Fax: [phone]   Franchot [name], M.D.

## 2010-06-20 NOTE — Op Note (Signed)
NAMELUGENE, HITT               ACCOUNT NO.:  [id number]   MEDICAL RECORD NO.:  [id number]          PATIENT TYPE:  INP   LOCATION:  1520                         [facility]ACILITY:  [facility]   PHYSICIAN:  John C. Madilyn Fireman, M.D.    DATE OF BIRTH:  02/26/1975   DATE OF PROCEDURE:  08/04/2008  DATE OF DISCHARGE:                               OPERATIVE REPORT   PROCEDURE:  Esophagogastroduodenoscopy with biopsy.   INDICATIONS FOR PROCEDURE:  Persistent nausea, vomiting, epigastric pain  and intermittent diarrhea over the course of greater than 1 week with  negative gallbladder ultrasound, failure to respond to  proton pump  inhibitor.   PROCEDURE:  The patient was placed in the left lateral decubitus  position and placed on the pulse monitor with continuous low-flow oxygen  delivered by nasal cannula.  She was sedated with 50 mcg IV fentanyl and  6 mg IV Versed.  The Olympus video endoscope was advanced under direct  vision into the oropharynx and esophagus.  The esophagus was straight,  of normal caliber, with the squamocolumnar line at 38 cm.  There was no  visible hiatal hernia, ring stricture or other abnormality of the  esophagus or GE junction.  The stomach was entered and there was a  moderately large amount of retained unrecognizable partly-digested food  in the dependent portions of the fundus.  Retroflexed view of the cardia  was otherwise unremarkable.  Part of the fundus was obscured but in the  fundus and particularly in the body along the lesser curvature, there  was a diffuse appearance of gastritis.  This appeared to be more  pronounced than one would expect from vomiting trauma and there were  areas of fairly pronounced nodularity within the inflamed area which was  otherwise characterized by erythema and a snake skin appearance.  Multiple biopsies were taken.  No focal ulcers were seen and no focal  mass, although I could not exclude an infiltrating neoplastic process.  The antrum  appeared spared and normal and the pylorus was patent with no  suggestion of scarring or fixation.  The duodenum was entered and both  the bulb and second portion were well inspected and appeared be within  normal limits.  Due to the diarrhea, biopsies were taken of normal-  appearing second portion of the duodenum to rule out celiac disease or  other process contributing to diarrhea.  The scope was then withdrawn  and the patient returned to the recovery room in stable condition.  She  tolerated the procedure well and there were no immediate complications.   IMPRESSION:  1. Apparent inflammatory process of the mid to proximal stomach.  2. Retained gastric contents.  3. No evidence of mechanical outlet obstruction.   PLAN:  Await all biopsy results.  Continue proton pump inhibitor and we  will add low-dose Reglan for now.           ______________________________  Everardo All. Madilyn Fireman, M.D.     JCH/MEDQ  D:  08/04/2008  T:  08/04/2008  Job:  [id number]

## 2010-06-20 NOTE — Group Therapy Note (Signed)
[name], [name]               ACCOUNT NO.:  [id number]   MEDICAL RECORD NO.:  [id number]          PATIENT TYPE:  INP   LOCATION:  1520                         [facility]ACILITY[facility]  [facility]   PHYSICIAN:  Marcellus Scott, MD     DATE OF BIRTH:  08-04-1975                                 PROGRESS NOTE   PRIMARY MEDICAL DOCTOR:  Dr. Franchot Heidelberg in [place], [place].   This is an addendum to the interim discharge summary that was done by  this dictator on August 05, 2008.   DISCHARGE DIAGNOSES:  1. Gastroparesis.  Etiology unclear. ? Precipitated by acute viral      gastroenteritis.  2. Gastritis on EGD.  3. Seizure disorder.  4. Depression.  5. Restless leg syndrome.   DISCHARGE MEDICATIONS:  To be dictated by the discharging M.D.   PROCEDURES:  Gastric emptying scan on August 10, 2008.  Impression:  Abnormal gastric  emptying with 80% of the radionuclide remaining at 2 hours.   PERTINENT LABS:  Basic metabolic panel on August 07, 2008, unremarkable with BUN 8,  creatinine 0.70.  CLOtest on biopsy was negative.   CONSULTATIONS:  1. Surgery, Dr. Luretha Murphy.  2. GI, Dr. Dorena Cookey.   HOSPITAL COURSE AND PATIENT DISPOSITION:  On August 05, 2008, patient had reached stability for possible discharge.  Her diet was advanced.  However, with that advanced diet, she complained  of abdominal pain that night and hence did not go home.  Next,  gastroenterologist saw her and recommended a CK PIPIDA scan.  They  recommended continuing the proton pump inhibitors and Reglan.  The  PIPIDA scan showed low ejection fraction with reproduction of pain with  the CCK.  GI consulted surgery.  Dr. Wenda Low kindly saw the patient.  He indicated that it was interesting that she had slow gastric emptying  as evidenced by retained food in the stomach on the EGD, as well as  delayed gallbladder emptying.  He did not recommend cholecystectomy but  wanted to watch her a while longer.  He started her on  sucralfate and  recommended advancing her diet slowly.  If the symptoms persisted, then  he recommended considering cholecystectomy for poor gallbladder EF.  He  was more concerned that the delay in excretion may be related to some  underlying problem that is causing delayed gastric emptying which may be  related to an infectious or inflammatory process in evolution.  Since  then, GI requested a gastric emptying scan which shows abnormal gastric  emptying.  Patient, however, indicates that she has no nausea or  abdominal pain.  However, patient is not eating her regular amounts.  She has no diarrhea.  At this time, we will await GI recommendations  regarding further management.  Unsure if a prokinetic such as  erythromycin may be considered..   Patient this morning complained of some involuntary movements of her  lower extremities: my restless leg syndrome seemed to be acting up.  This was after a dose of Phenergan.  Phenergan has been discontinued.  She has had no further involuntary movements at  this time.  If these  movements increase, also consider discontinuing Reglan.   Depression: Stable. Patient has no history of suicidal or homicidal  ideations or delusions or hallucinations.   At this time, we are waiting for patient to tolerate diet. We will  follow GI recommendations and consider discharge when stable.      Marcellus Scott, MD  Electronically Signed     AH/MEDQ  D:  08/10/2008  T:  08/10/2008  Job:  [id number]   cc:   Everardo All. Madilyn Fireman, M.D.  Fax: [phone]   [name] Daphine Deutscher, MD  [address]  [place]  [place] [address]   Franchot Heidelberg, M.D.

## 2010-06-20 NOTE — Discharge Summary (Signed)
[name], [name]               ACCOUNT NO.:  [id number]   MEDICAL RECORD NO.:  [id number]          PATIENT TYPE:  INP   LOCATION:  1520                         [facility]ACILITY:  [facility]   PHYSICIAN:  Hind I Elsaid, MD      DATE OF BIRTH:  26-Mar-1975   DATE OF ADMISSION:  08/02/2008  DATE OF DISCHARGE:  08/11/2008                               DISCHARGE SUMMARY   DISCHARGE DIAGNOSES:  Please refer to those dictated by Dr. Waymon Amato.   DISCHARGE MEDICATIONS:  1. Protonix 40 mg p.o. daily.  2. Reglan 10 mg p.o. q.i.d.  3. Zofran 2 mg p.o. q.6 h. as needed.  4. Keppra 1500 mg p.o. at bedtime.  5. Phenobarb 50 mg p.o. at bedtime.  6. Pristiq 100 mg by mouth at bedtime.  7. Fish oil 1 tab p.o. at bedtime.  8. Requip 2 mg by mouth at bedtime.   FOLLOW UP:  Appointment to see Dr. Madilyn Fireman on August 24, 2008 at 4:15 p.m.   HOSPITAL COURSE:  Please see the discharge summary done by Dr. Marcellus Scott on August 10, 2008.  Regarding abdominal pain, nausea and pain,  felt to be secondary to gastroparesis.  Dr. Madilyn Fireman recommended to  discontinue Phenergan as it could exacerbate her restless leg syndrome.  Recommendation was Zofran in addition to Reglan.  The patient has an  appointment to see Dr. Madilyn Fireman on August 24, 2008 at 4 p.m.  Also  recommendation to continue with low-fat and small frequent meals.  The  patient was advised not to take any narcotics for her pain.  Tylenol  p.r.n. was pain medication prescribed advised to the patient.  At this  time, it is felt the patient is medically stable to be discharged and  follow with Dr. Madilyn Fireman as outpatient.  Follow with her primary care  doctor, Dr. Erby Pian at [place].      Hind Bosie Helper, MD  Electronically Signed     HIE/MEDQ  D:  08/11/2008  T:  08/11/2008  Job:  [id number]

## 2010-06-20 NOTE — Discharge Summary (Signed)
[name], [name]               ACCOUNT NO.:  [id number]   MEDICAL RECORD NO.:  [id number]          PATIENT TYPE:  OBV   LOCATION:  A310                          FACILITY:  [facility]   PHYSICIAN:  Margaretmary Dys, M.D.DATE OF BIRTH:  1975-04-30   DATE OF ADMISSION:  09/01/2007  DATE OF DISCHARGE:  07/29/2009LH                               DISCHARGE SUMMARY   DISCHARGE DIAGNOSES:  1. Probable new-onset seizures.  2. History of anxiety and depression.   DISCHARGE MEDICATIONS:  1. Clonazepam 0.5 mg p.o. b.i.d.  2. Requip 2 mg p.o. daily.  3. Iron tablet once a day.  4. Metoclopramide 10 mg p.o. q.i.d.   OTHER DIAGNOSIS OF NOTE:  Restless leg syndrome.   PERTINENT LABORATORY DATA:  On admission, urine was negative.  Glucose  was 122.  White blood cell count 5.8, hemoglobin 13.1, hematocrit was  37.9, platelet count was 184.  Head CT scan was negative for any active  intracranial process.   PROCEDURES PERFORMED:  EEG, the results of which are pending at time of  this dictation.  Patient is to follow up with Dr. Gerilyn Pilgrim.   CONSULTATIONS:  Kofi A. Gerilyn Pilgrim, M.D., neurology.  Reason for  consultation was for management of new-onset seizures.   HOSPITAL COURSE:  [name] is a 35 year old female, who was admitted  to the hospital on September 01, 2007.  She presented to the emergency room  with a history of seizure-like activity.  This was witnessed by the  husband.  Patient was noticed, after she came out of the bathroom, to  start shaking.  She fell to the floor and apparently her hands were  across her body and she was having blood out of her mouth, like she may  have bitten her tongue.  Patient's husband then called EMS and EMS came  and brought the patient to the hospital.  When EMS arrived at her home,  patient was noted to be combative and they had a hard time putting her  on a stretcher.  Patient was noted to have also pulled out an IV when  placed in the ambulance.  Patient  has only a very mild recollection of  what happened.   On her evaluation here in the emergency room by Dr. Elige Radon on  admission, patient was noted to be conscious, alert, comfortable, not in  acute distress, was well-oriented in time, place and person, had no  focal deficits.  The patient had some mild lacerations to her tongue or  possibly just redness.  The rest of her labs were unremarkable.  Patient  was subsequently admitted for further evaluation.   Her laboratory data was negative and a neurology consult was obtained,  as mentioned above.  Patient did not receive any seizure medication.  Throughout her hospitalization, patient did not have any seizure  episodes.   Patient was seen by me on the morning of September 03, 2007, was doing much  better.  Patient was subsequently discharged home.  She is going to  follow up with Dr. Gerilyn Pilgrim in about two weeks.  Dr. Gerilyn Pilgrim is going  to read her EEG tonight and call patient with the results.  This was my  discussion with him.   SPECIAL INSTRUCTIONS:  1. Patient is to follow with Dr. Gerilyn Pilgrim in two weeks.  The results      of the EEG will be discussed with her later tonight by Dr.      Gerilyn Pilgrim.  She was advised to call the office of Dr. Gerilyn Pilgrim      tomorrow, if she does not hear from him.  2. Patient is not to drive a vehicle or any moving machinery or      operate any machinery until cleared by Dr. Gerilyn Pilgrim, the      neurologist.      Margaretmary Dys, M.D.  Electronically Signed     AM/MEDQ  D:  09/03/2007  T:  09/03/2007  Job:  [id number]   cc:   Darleen Crocker A. Gerilyn Pilgrim, M.D.  Fax: [phone]

## 2010-06-20 NOTE — H&P (Signed)
NAMEBRITTIN, [name]               ACCOUNT NO.:  [id number]   MEDICAL RECORD NO.:  [id number]          PATIENT TYPE:  INP   LOCATION:  0102                         FACILITY[facility]  [facility]   PHYSICIAN:  Eduard Clos, MDDATE OF BIRTH:  [birth year]   DATE OF ADMISSION:  08/02/2008  DATE OF DISCHARGE:                              HISTORY & PHYSICAL   PRIMARY CARE PHYSICIAN:  Dr. Franchot Heidelberg at [place].   CHIEF COMPLAINT:  Nausea, vomiting, diarrhea.   HISTORY OF PRESENT ILLNESS:  A 35 year old female with history of a  seizure disorder and depression presented to ER after the patient was  having persistent nausea, vomiting, and diarrhea over the last one week.  The patient and her husband had gone for a planned visit in [place] a few hours from [place] and she had outdoor camping  subsequent to which she had ice cream and started to develop nausea,  vomiting, and diarrhea with abdominal pain in the epigastric area.  Since the persistent nature, the next day, the patient had gone to  [facility] where she had a CAT scan of the abdomen and pelvis  which did not show anything acute.  The patient was discharged with some  symptomatic treatment despite which the patient has been getting  persistent nausea, vomiting, and diarrhea.  It did improve some  yesterday when she ate some food at [facility], and symptoms recurred  again and she ended up at [facility] ER.  The patient is not able down  anything because of the persistent nature of nausea and vomiting.  The  pain in the epigastric area is colicky in nature, increased with food,  lasts for few seconds each time, and comes multiple times in a day.  She  has also been having multiple episodes of diarrhea.  The vomitus and the  diarrhea do not have any blood in it.  Denies any fever or chills.   Denies any chest pain, shortness of breath, dizziness, loss of  consciousness, focal deficits.  Denies any headache.   Denies any  dysuria.  The patient did have some rash yesterday which she said there  was like hives which resolved by itself.  She is not sure if she had any  new medication or if there is any insect bite.   PAST MEDICAL HISTORY:  Seizure disorder and depression.   PAST SURGICAL HISTORY:  Hysterectomy and appendectomy.   MEDICATIONS ON ADMISSION:  Keppra 1500 mg p.o. at bedtime, Pristiq 100  mg p.o. at bedtime, phenobarbital 15 mg p.o. at bedtime, fish oil 1  tablet p.o. at bedtime,  Rective 2 mg at bedtime, Phenergan 25 mg as  needed, Percocet 7.5 mg as needed which was recently started.   FAMILY HISTORY:  Noncontributory.   SOCIAL HISTORY:  The patient lives with her husband.  Denies smoking  cigarettes, drink alcohol or using illegal drugs.   REVIEW OF SYSTEMS:  As in history of present illness.  Nothing else  significant.   PHYSICAL EXAMINATION:  The patient examined at bedside, not in acute  distress.  VITAL SIGNS:  Blood pressure 109/70, pulse 69 per minute, temperature  98.5, respirations 18 per minute,  O2 sat 99%.  HEENT:  Anicteric.  No pallor.  CHEST:  Bilateral air entry present.  No rhonchi.  No crepitation.  HEART:  S1, S2 heard.  ABDOMEN:  Soft, nontender.  Bowel sounds heard.  No guarding or  rigidity.  No discoloration.  CNS: Alert, awake, alert and oriented to time, place, and person.  Moves  upper and lower extremities 5/5.  EXTREMITIES:  Peripheral pulses felt.  No edema.   LABORATORY DATA:  CAT scan abdomen and pelvis done at [facility] was read on July 29, 2008 as no renal stone or  acute finding, mild splenomegaly, 4 cm structure right adnexa, probably  normal right ovary.  Correlate with surgical history.  CBC: WBCs 5.8,  hemoglobin 12.2, hematocrit 36, platelets 191.  Complete metabolic  panel:  Sodium 138, potassium 2.9, chloride 106, carbon dioxide 28,  glucose 111, BUN 6, creatinine 0.6, alkaline phosphatase is 57,  AST 21,  ALT 37 albumin 3.1, calcium 8.5.  Lipase 21.  Pregnancy screen is  negative.  UA is negative for LE and leukocytes.   ASSESSMENT:  1. Intractable nausea, vomiting, abdominal pain, and diarrhea,      probably viral gastroenteritis.  2. Dehydration.  3. Hypokalemia.  4. History of seizure disorder.  5. History of depression, presently no suicidal ideation.   PLAN:  1. Will admit the patient to telemetry due to severe hypokalemia and      once hypokalemia is corrected, probably the patient can go to      regular floor.  2. Will treat the patient with IV hydration and clear liquid diet.  3. At this time, will place the patient on IV Keppra until the patient      can reliably swallow food.  4. Will send stool for C diff, ova and parasites, culture, and      lactoferrin.  5. If the patient is able to keep in food and symptoms improve, will      advance diet and discharge home or otherwise if the patient's      symptoms are worsening, we will need to have a repeat scan done.      Eduard Clos, MD  Electronically Signed     ANK/MEDQ  D:  08/02/2008  T:  08/02/2008  Job:  [id number]   cc:   Franchot Heidelberg, M.D.

## 2010-06-20 NOTE — Group Therapy Note (Signed)
[name], [name]               ACCOUNT NO.:  [id number]   MEDICAL RECORD NO.:  [id number]          PATIENT TYPE:  OBV   LOCATION:  A310                          FACILITY:  [facility]   PHYSICIAN:  Dorris Singh, DO    DATE OF BIRTH:  1975/06/23   DATE OF PROCEDURE:  09/02/2007  DATE OF DISCHARGE:                                 PROGRESS NOTE   The patient seen today.  Did not have any additional seizure activity.  Talked to Dr. Gerilyn Pilgrim.  He has an EEG ordered for her today which he  will evaluate.  The patient states she is doing okay.  Did have some  issues with her legs last night.  Will increase her Requip today as  well.  Maybe that should help with her restless leg syndrome.   PHYSICAL EXAMINATION:  VITAL SIGNS:  Temperature 98.2, pulse 64,  respirations 20, blood pressure 103/65.  GENERAL:  The patient is a 35 year old Caucasian female who is well-  developed, well-nourished, in no acute distress.  HEART:  Regular rate and rhythm.  LUNGS:  Clear to auscultation bilaterally.  ABDOMEN:  Soft, nontender, nondistended.  EXTREMITIES:  Positive pulses.  No ecchymosis, edema or cyanosis.  NEUROLOGICAL:  Cranial nerves II-XII grossly intact, A + O x3,   LABORATORY DATA:  Labs for today:  Her white count is within normal  limits except for hemoglobin of 35.8 and her BMET is within normal  limits except for glucose of 141.   PLAN:  New onset seizure activity.  The patient had an EEG ordered and  has been seen by Dr. Gerilyn Pilgrim today.  Depending on when the EEG is read  will plan on discharging her in the morning with any recommendations  that neurology may have, and also will increase her Requip for restless  leg syndrome.      Dorris Singh, DO  Electronically Signed     CB/MEDQ  D:  09/02/2007  T:  09/02/2007  Job:  [id number]

## 2010-06-20 NOTE — Procedures (Signed)
[name], [name]               ACCOUNT NO.:  [id number]   MEDICAL RECORD NO.:  [id number]          PATIENT TYPE:  OBV   LOCATION:  A310                          FACILITY:  [facility]   PHYSICIAN:  Kofi A. Gerilyn Pilgrim, M.D. DATE OF BIRTH:  [birth year]   DATE OF PROCEDURE:  DATE OF DISCHARGE:  09/03/2007                              EEG INTERPRETATION   HISTORY:  This is a 31-year lady who presents with 2 back-to-back  seizures.   MEDICATIONS:  Lovenox, Protonix, Requip, Zofran, Ativan and Vicodin.   ANALYSIS:  A 16-channel recording using standard 10-20 measurements is  conducted for 25 minutes.  There is a well-formed posterior rhythm of  9.5 Hz, which attenuates with eye opening.  Weak and drowsy activities  are recorded.  Vertex sharp  waves are observed.  The patient is noted  to have 2 generalized spike slow wave activity at a rate of 3 Hz.  Photic stimulation and hypoventilation does not result in significant  changes in the background activity.  There is no focal or lateralized  slowing.   IMPRESSION:  Abnormal recording showing generalized spike slow wave  activity.  The patient will be contacted for appropriate medications.      Kofi A. Gerilyn Pilgrim, M.D.  Electronically Signed     KAD/MEDQ  D:  09/03/2007  T:  09/03/2007  Job:  [id number]

## 2010-06-20 NOTE — Consult Note (Signed)
[name], [name]               ACCOUNT NO.:  [id number]   MEDICAL RECORD NO.:  [id number]          PATIENT TYPE:  INP   LOCATION:  1520                         FACILITY:  [facility]   PHYSICIAN:  [name]. Daphine Deutscher, MD  DATE OF BIRTH:  Nov 29, 1975   DATE OF CONSULTATION:  08/07/2008  DATE OF DISCHARGE:                                 CONSULTATION   CHIEF COMPLAINT:  Nausea, vomiting and abnormal CCK stimulated HIDA  scan.   HISTORY:  [name] is a 35 year old Caucasian woman who is brought to  the ED by her husband at approximately 1 a.m. on August 02, 2008.  She is  followed by her primary care doctor, Dr. Franchot Heidelberg with  underlying problems including restless leg syndrome, epilepsy,  depression and anxiety.   The story goes that last week she and her husband were over at  [place] at an outdoor revival although they were staying indoors at  a hotel and she began noticing after eating pizza that she would have  abdominal pain with some nausea and vomiting.  This led her to be seen  in the [facility] in [place] and she had CT scan  imaging which showed no kidney stones, mild splenomegaly and a 4 cm  structure in the right adnexa, probably normal right ovary.  There was  no oral or IV contrast given so that really limits the ability to look  at her GI tract.   MEDICATIONS:  Her medicines include:  1. Keppra 1500 mg at bedtime.  2. Pristiq 100 mg at bedtime.  3. Phenobarbital 15 mg at bedtime.  4. Fish oil.  5. Requip 2 tablets 2 mg at bedtime.  6. Phenergan.  7. Percocet.   Since she has been here she underwent an upper endoscopy by Dr. Madilyn Fireman  which showed some nodular hyperplasia and gastritis in her distal antrum  and biopsies were taken.  She had a normal gallbladder ultrasound.  Her  HIDA scan showed rapid uptake in her gallbladder but a slow ejection  fraction of 11% after CCK administration.   DISCUSSION:  It is interesting that she has  slow gastric emptying as  evidenced by retained food in her stomach on the EGD as well as delayed  gallbladder emptying.  Since she has been here, she has not had any  diarrhea.  Apparently, that has been a problem when she eats at some of  the fast food places.   In the near term, I do not think that I would recommend cholecystectomy,  but would tend to want to watch her a while longer.  I am going to order  some sucralfate to see if that coats her stomach and helps some of the  gastritis.  But, I think giving her at least a liquid diet and gradually  trying her to advance her back to a regular diet would be prudent.  If  this persists, then we could consider doing a cholecystectomy for the  poor ejection fraction.  However, I  am concerned that the delay in the excretion may be related to  the same  underlying problem that is causing her delayed gastric emptying which  may be related to an infectious/inflammatory process in evolution.  I  will be glad to follow along with you.      [name] Daphine Deutscher, MD  Electronically Signed     MBM/MEDQ  D:  08/07/2008  T:  08/07/2008  Job:  [id number]   cc:   Franchot Heidelberg, M.D.

## 2010-06-22 ENCOUNTER — Telehealth: Payer: Self-pay | Admitting: *Deleted

## 2010-06-22 NOTE — Telephone Encounter (Signed)
Pt left message for [place] to call her. Attempted to reach pt and left message on machine.

## 2010-06-23 ENCOUNTER — Telehealth: Payer: Self-pay | Admitting: *Deleted

## 2010-06-23 NOTE — Telephone Encounter (Signed)
Pt left message requesting pain med for her hand pain. Left message for pt to return my call and clarify which hand, is this a new symptom or did she discuss this with [name] at her previous visit.

## 2010-06-23 NOTE — Group Therapy Note (Signed)
[name], [name]               ACCOUNT NO.:  [id number]   MEDICAL RECORD NO.:  [id number]          PATIENT TYPE:  WOC   LOCATION:  [facility]                   FACILITY:  [facility]   PHYSICIAN:  Tinnie Gens, MD        DATE OF BIRTH:  [birth year]   DATE OF SERVICE:  01/13/2004                                    CLINIC NOTE   CHIEF COMPLAINT:  Follow-up.   HISTORY OF PRESENT ILLNESS:  The patient is a 35 year old gravida 2 para 2  status post BTL who has previously had a diagnostic scope for endometriosis  that was ablated.  She is on continuous monophasic OC's since that time.  She continues to have some bleeding especially on the first week of her new  cycle as she is not taking her placebo pills.  She states that her pain is  much improved.  She has had sex recently and had some pain but it is not the  same pain as what she is having before.  The patient is getting ready to  have a period for the first time in 3 months in the next week.   PHYSICAL EXAMINATION TODAY:  GENERAL:  She is well-developed, well-nourished  white female in no acute distress.  VITAL SIGNS:  As noted in the chart.  ABDOMEN:  Soft, nontender, nondistended.  PELVIC:  She has no cervical motion tenderness.  The uterus is small and  anteverted.  The adnexa has some right-sided tenderness.  The left side is  normal.  The right adnexa feels normal to palpation.   IMPRESSION:  Endometriosis.   PLAN:  Switch to a triphasic and will see her back before she has her next  cycle to see how this is working for her.  If this fails to keep her without  symptomatology, the patient may need to proceed with hysterectomy.      TP/MEDQ  D:  01/13/2004  T:  01/14/2004  Job:  [id number]

## 2010-06-23 NOTE — Group Therapy Note (Signed)
[name], SOKOL                         ACCOUNT NO.:  [id number]   MEDICAL RECORD NO.:  [id number]                   PATIENT TYPE:  OUT   LOCATION:  [facility]                           FACILITY:  [facility]   PHYSICIAN:  Tinnie Gens, MD                     DATE OF BIRTH:  07-10-1975   DATE OF SERVICE:  09/17/2003                                    CLINIC NOTE   CHIEF COMPLAINT:  Pelvic pain.   HISTORY OF PRESENT ILLNESS:  The patient is a 35 year old gravida 2, para 2-  0-0-2 who is referred from family practice clinic.  The patient reports a 1-  year history of worsening dysmenorrhea and dyspareunia.  She reports that  the pain is with intercourse and is with deep penetration.  She also has  worsening cramping and pain with her periods.  She also complained today of  some urinary leakage when she coughs.  The patient has noted history of  dysmenorrhea as a young child.   PAST MEDICAL HISTORY:  Significant for depression.   PAST SURGICAL HISTORY:  Previous BTL.   ALLERGIES:  None known.   MEDICATIONS:  Prozac and Pepcid.   OBSTETRICAL HISTORY:  Two SVDs.   GYNECOLOGICAL HISTORY:  History of abnormal Pap which just had to be  repeated, previous tubal ligation, no history of STDs.   FAMILY HISTORY:  Diabetes mellitus, coronary artery disease, hypertension,  and colon cancer.   A 14-point review of systems is reviewed and is positive for fatigue,  frequent headaches, problems with her vision, problems with breathing or  shortness of breath, loss of urine with coughing and sneezing, vaginal odor,  and vaginal itching.  Most of these things are followed up by family  practice.   PHYSICAL EXAMINATION:  VITAL SIGNS:  On exam today, her temp is 97.7, weight  is 187,  pulse is 72, blood pressure is 122/74.  GENERAL:  She is a well-developed, well-nourished female in no acute  distress.  HEENT:  Sclerae anicteric.  NECK:  Supple.  LUNGS:  Clear.  HEART:  Regular.  ABDOMEN:   Soft, tender to deep palpation of left lower quadrant but no  rebound or guarding.  There is intermittent lymphadenopathy.  GENITOURINARY:  Normal external female genitalia.  The vagina is rugated.  Cervix is without lesion.  There is some tenderness with movement of the  cervix.  The uterus is small and anteverted and may be somewhat tender.  Adnexa:  There is no significant mass.  The left side is tender.  EXTREMITIES:  No clubbing, cyanosis, or edema.   IMPRESSION:  1. Pelvic pain including dysmenorrhea and dyspareunia.  The combination of     symptoms does sound suspiciously like endometriosis; however, the     patient's history is not quite consistent with what one would expect with     the natural progression of these.  May be  related to interstitial     cystitis or other pelvic pain syndrome.  2. Depression--may be contributing more to pain.   PLAN:  I have scheduled this patient for laparoscopy and cystoscopy to help  with rule out.  Also offered her treatment with oral contraceptives and  nonsteroidal anti-inflammatories.  The patient declined this option and  would like the surgery as soon as possible.                                               Tinnie Gens, MD    TP/MEDQ  D:  09/17/2003  T:  09/18/2003  Job:  [id number]

## 2010-06-23 NOTE — Op Note (Signed)
[name], [name]               ACCOUNT NO.:  [id number]   MEDICAL RECORD NO.:  [id number]          PATIENT TYPE:  INP   LOCATION:  A417                          FACILITY:  [facility]   PHYSICIAN:  Tilda Burrow, M.D. DATE OF BIRTH:  1975/12/27   DATE OF PROCEDURE:  DATE OF DISCHARGE:                                 OPERATIVE REPORT   PREOPERATIVE DIAGNOSES:  1.  Dyspareunia.  2.  Endometriosis.  3.  Chronic pelvic pain.   POSTOPERATIVE DIAGNOSES:  1.  Dyspareunia.  2.  Endometriosis.  3.  Chronic pelvic pain.   OPERATION/PROCEDURE:  1.  Total abdominal hysterectomy, left salpingo-oophorectomy.  2.  Appendectomy.  3.  Panniculectomy.   SURGEON:  Tilda Burrow, M.D.   ASSISTANT:  Morrie Sheldon, R.N.   ANESTHESIA:  General.   COMPLICATIONS:  None.   FINDINGS:  Minimal evidence of endometriosis.  Thickened fibrotic, enlarged  left ovary raising the question of endometrioma or suboptimal ovulation,  removed.  No distinct endometriosis involving the right tube or ovary.  Very  mobile right adnexa.   DESCRIPTION OF PROCEDURE:  The patient was taken to the operating room,  prepped and draped for lower abdominal surgery.  Legs were supported with  pelvis in slightly flexed position.   A lower abdominal transverse incision was made removing an ellipse of skin  approximately 70 cm in length, and measuring approximately 15 cm wide at its  maximum width.  This ellipse of skin went from anterior superior iliac crest  on each side to the midline, removing the redundant skin as delineated by  the patient's prior preoperative request.  This was excised down to a depth  of approximately half inch above the fascia, leaving a good healthy layer of  fatty tissue above the fascia, all the way across.  A midline incision was  then made in the method of Pelosi, opening the abdomen cavity without  difficulty using blunt dissection on the peritoneum.  The bowel was packed  away using three  moistened lap tapes and Balfour retractor.  The uterus was  grasped with Lahey thyroid tenaculum.  Round ligament was clamped, cut and  suture ligated on either side.  Adnexa inspected and decision made that the  left tube and ovary were abnormal enough to warrant excision.  Left  infundibulopelvic ligament was isolated, clamped, cut and suture ligated.  The left tube and ovary were passed off the table.  The right adnexa was  normal enough to allow preservation of the right tube and ovary.  Utero-  ovarian ligament was clamped, cut and suture ligated.  Uterine vessels were  skeletonized using sharp dissection, then crossclamped with curved Heaney  clamps.  Kelly clamp was used for backbleeding and then cut and suture  ligated.  Upper and lower cardinal ligaments were serially clamped, cut and  suture ligated reaching the level of the vaginal cuff.  The uterosacral  ligaments were not pulled laterally into the lower cardinal ligament.  It  was left separate to enter into the back of the vaginal cuff.  The anterior  cervical vaginal fornix  was then entered using a #15 blade stab incision.  Kocher clamps were used to grasp the vaginal cuff and the cervix amputated  off the cuff.  Aldridge stitches were placed at each lateral vaginal angle  to attach the vaginal cuff effectively to the lower cardinal ligament  support structures.  The cuff was then closed, placing a small interrupted  suture in the posterior midline to reduce the length of the posterior  vaginal mucosa.  Then the rest of the cuff was closed in a transverse  fashion.  Interrupted sutures were used with good results, tissue  approximation and hemostasis.  Irrigation was performed and paninspection of  the bladder flap confirmed there was small amount of oozing from the edges  which were controlled with unipolar cautery as necessary.  Bladder flap was  tacked down lightly x1 in the midline over the cuff once irrigation had   confirmed hemostasis.   Appendectomy:  After removal of laparotomy tapes and inspection of the  ascending colon, a very elongate appendix, somewhat retrocecal in position  was identified.  It was elevated.  The mesoappendix separated into four  separate pedicles, clamped, cut and suture ligated.  The appendiceal stump  was crossclamped with Kelly clamps x2, transected and the stump tied with 2-  0 chromic with good tissue hemostasis.  An imbricating stitch was planned  but trying to place the stitch, we got into some bleeding from the  mesoappendix, and after controlling the bleeding, we decided that no further  imbrication efforts were warranted.   The pelvis was irrigated.  Hemostasis was satisfactory throughout the case.  Anterior peritoneum was then closed using 2-0 chromic.  The fascia was  closed with continuous running 0 Vicryl with good hemostasis.   Prior to the [facility] opening of the abdominal midline incision into the  abdominal cavity, we closed the lateral aspects of the panniculectomy for a  distance on each side approximately one-third the length of the incision by  placing interrupted 2-0 plain sutures in the tissue, pulling it together.  A  flat JP drain was now placed under the prior tissue edge approximations.  The middle third of the incision was then reapproximated after irrigation,  confirmation of hemostasis, reapproximation using interrupted 2-0 plain  sutures to pull the skin edges together.  There had been significant amount  of undermining of the inferior aspects of the incision as well as the  superior to allow for good tissue approximation.  This resulted in excellent  results and the drains were allowed to exit the skin through the inferior  separate stab incisions that were made just above the mons pubis.  These  were attached to Hemovac handheld drains.  The middle third of the skin was stapled closed with staples as the lateral aspects had done earlier.   Hemostasis was excellent throughout the case with 250 mL blood loss.  Sponge  and needle counts were correct.  The patient went to the recovery room in  good condition.   Pelvis was irrigated.  Laparotomy equipment removed, anterior peritoneum  closed using running 3-0 chromic, followed by running 0 Vicryl closure of  the fascia, interrupted 2-0 plain closure of the subcu spaces and staple  closure of the skin used to complete the procedure.  Estimated blood loss  100 cc.     JVF/MEDQ  D:  08/24/2004  T:  08/24/2004  Job:  [id number]

## 2010-06-23 NOTE — Telephone Encounter (Signed)
I would recommend ES tylenol 1000mg  PO every 8 hours as needed for pain.

## 2010-06-23 NOTE — Op Note (Signed)
[facility]  Patient:    [name], [name] Visit Number: [id number] MRN: [id number]          Service Type: OBS Location: [facility] Attending Physician:  Tammi Sou Dictated by:   Roseanna Rainbow, M.D. Proc. Date: 11/07/00 Admit Date:  09/19/2000   CC:         [facility]N Outpatient Clinic, [facility]   Operative Report  PREOPERATIVE DIAGNOSIS:       Mutliparity, desires permanent sterilization.  POSTOPERATIVE DIAGNOSIS:      Mutliparity, desires permanent sterilization.  PROCEDURE:                    Laparoscopic tubal ligation with bipolar cautery.  SURGEON:                      Roseanna Rainbow, M.D.  ANESTHESIA:                   General endotracheal.  COMPLICATIONS:                None.  ESTIMATED BLOOD LOSS:         25 cc.  FLUIDS:                       As per anesthesiology.  URINE OUTPUT:                 100 cc of clear urine at the beginning of the procedure.  FINDINGS:                     Normal uterus, tubes and ovaries.  DESCRIPTION OF PROCEDURE:     The patient was taken to the operating room where general anesthesia was obtained without difficulty.  The patient was examined under anesthesia and found to have a small, anteverted uterus with normal adnexa.  She was then placed in the dorsal lithotomy position, prepped and draped in the usual sterile fashion.  A bivalve speculum was placed in the patients vagina and the anterior lip of the cervix was grasped with a single-tooth tenaculum.  A Hulka dilator was then advanced into the uterus and secured to the anterior inpatient of the cervix as a means to manipulate the uterus.  The single-tooth tenaculum and speculum were removed from the vagina. Attention was then turned to the patients abdomen, where a 10 mm skin incision was then made in the umbilical fold.  A Veress needle was carefully introduced into the peritoneal cavity at a 45-degree angle while  tenting the abdominal wall.  Intraperitoneal placement was confirmed with the use of a water-filled syringe and a drop in the intra-abdominal pressure with insufflation with CO2 gas.  The trocar and sleeve were then advanced without difficulty into the abdomen, where intra-abdominal placement was confirmed by the laparoscope.  A survey of the patients pelvis and abdomen revealed entirely normal anatomy.  The bipolar cautery apparatus was then advanced through the operative port of the laparoscope and the patients left fallopian tube was identified and followed out to the fimbriated end.  A 2-3 cm segment in the midisthmic area was then cauterized contiguously.  With each application, the ohmmeter was noted to go to zero.  The right fallopian tube was manipulated in a similar fashion.  The instruments were then removed from the patients abdomen and the incision repaired with 3-0 Vicryl.  The Hulka dilator and tenaculum were removed  from the vagina with minimal bleeding noted from the cervix.  The patient tolerated the procedure well.  Sponge, lap, and needle counts were correct x 2.  The patient was taken to the PACU in stable condition. Dictated by:   Roseanna Rainbow, M.D. Attending Physician:  Tammi Sou DD:  11/07/00 TD:  11/07/00 Job: [id number]

## 2010-06-23 NOTE — Telephone Encounter (Signed)
Pt.notified

## 2010-06-23 NOTE — Discharge Summary (Signed)
[name], [name]               ACCOUNT NO.:  [id number]   MEDICAL RECORD NO.:  [id number]          PATIENT TYPE:  INP   LOCATION:  A417                          FACILITY:  [facility]   PHYSICIAN:  Tilda Burrow, M.D. DATE OF BIRTH:  Aug 29, 1975   DATE OF ADMISSION:  08/24/2004  DATE OF DISCHARGE:  07/23/2006LH                                 DISCHARGE SUMMARY   ADMITTING DIAGNOSES:  1.  Dyspareunia.  2.  Endometriosis.  3.  Chronic pelvic pain.   DISCHARGE DIAGNOSES:  1.  Dyspareunia.  2.  Endometriosis.  3.  Chronic pelvic pain.   PROCEDURE:  1.  Total abdominal hysterectomy.  2.  Bilateral salpingo-oophorectomy.  3.  Appendectomy.  4.  Panniculectomy.   DISCHARGE MEDICATIONS:  1.  Dilaudid 2 mg p.o. q.4h. p.r.n. pain.  2.  Keflex 500 mg four times a day x5 days.   HOSPITAL SUMMARY:  This is a 35 year old multiparous female status post  tubal ligation and laparoscopic-diagnosed endometriosis, pelvic pain and  dyspareunia reproducible on uterine contact. The patient specifically  desired ovarian preservation for any ovary that appeared normal.  Panniculectomy requested. Procedure reviewed including details related to  increased incision size and increased potential for infection, seroma or  poor healing.   PAST MEDICAL HISTORY:  Benign.   PAST SURGICAL HISTORY:  Tubal ligation, laparoscopy for endometriosis.   No known drug allergies.   Height 5 feet 10 inches, weight 189.   HOSPITAL COURSE:  The patient underwent TAH/BSO, appendectomy and  panniculectomy with a relatively small blood loss of 250 cubic centimeters.  There was removal of an abdominal wall subcu fatty tissue in skin measuring  645 g, measuring 30 x 12 cm x 5 cm after fixation. Uterus showed a small 112  g uterus with pathology showing chronic inflammation of the cervix, a  secretory phase endometrium, hemorrhagic corpus luteum cyst to the left  ovary with large appendix measuring 8.5 cm even after  fixation.   The hospital course was uneventful with patient tolerating a regular diet  within 2 days. Dressing dry with patient requiring increased pain  medications at discharge. She had Phenergan as an inpatient and was stable  for discharge on 08/27/04.      Tilda Burrow, M.D.  Electronically Signed     JVF/MEDQ  D:  10/03/2004  T:  10/04/2004  Job:  [id number]

## 2010-06-23 NOTE — H&P (Signed)
[facility]  Patient:    [name], [name] Visit Number: [id number] MRN: [id number]          Service Type: OBS Location: [facility] Attending Physician:  Tammi Sou Dictated by:   Roseanna Rainbow, M.D. Admit Date:  09/19/2000   CC:         [facility]  GYN Outpatient Clinic - [facility]   History and Physical  CHIEF COMPLAINT:              The patient is a 35 year old para 2 who desires a sterilization procedure.  HISTORY OF PRESENT ILLNESS:   The patient has used Depo-Provera and oral contraceptives in the past.  The risks, benefits, and alternatives for the management were reviewed with the patient, including but not limited to a failure rate of 4-8 per 1000 cases, with a subsequent increased risk of an ectopic gestation.  ALLERGIES:                    No known drug allergies.  CURRENT MEDICATIONS:          Prenatal vitamins.  PAST OB/GYN HISTORY:          She is status post two NSVDs.  She denies any history of any sexually-transmitted diseases.  PAST MEDICAL HISTORY:         She denies.  PAST SURGICAL HISTORY:        She denies.  SOCIAL HISTORY:               No tobacco, ethanol, or drug use.  FAMILY HISTORY:               Remarkable for diabetes mellitus, hypertension, CVA, and coronary artery disease.  PHYSICAL EXAMINATION:  VITAL SIGNS:                  Pulse 80, blood pressure 99/68, weight 174-1/2 pounds, height 5 feet 6 inches.  GENERAL:                      A well-developed, well-nourished female in no apparent distress.  HEENT:                        Normocephalic, atraumatic.  NECK:                         Supple.  LUNGS:                        Clear to auscultation bilaterally.  HEART:                        A regular rate and rhythm.  ABDOMEN:                      Soft, nontender, no organomegaly.  NODES:                        No adenopathy.  PELVIC:                        The external female genitalia are normal-appearing.  On speculum examination there are no lesions.  A Pap smear was taken.  The cervix is slightly friable.  A GC and Chlamydia culture taken as well.  BIMANUAL EXAMINATION:  The uterus is anteverted, upper limits of normal in size, nontender.  Adnexa are nonpalpable, nontender.  EXTREMITIES:                  No cyanosis, clubbing, or edema.  SKIN:                         Without rash.  ASSESSMENT:                   A 35 year old multipara who desires a                               sterilization procedure.  PLAN:                         Laparoscopic bilateral tubal ligation. Dictated by:   Roseanna Rainbow, M.D. Attending Physician:  Tammi Sou DD:  10/22/00 TD:  10/22/00 Job: [id number]

## 2010-06-23 NOTE — Procedures (Signed)
NAMEDORANN, DAVIDSON               ACCOUNT NO.:  [id number]   MEDICAL RECORD NO.:  [id number]          PATIENT TYPE:  OUT   LOCATION:  SLEEP LAB                     FACILITY:  [facility]   PHYSICIAN:  Marcelyn Bruins, M.D. [facility] DATE OF BIRTH:  03/26/1975   DATE OF STUDY:  06/05/2004                              NOCTURNAL POLYSOMNOGRAM   REFERRING PHYSICIAN:  Dr. Lindaann Pascal.   INDICATION FOR THE STUDY:  Hypersomnia with sleep apnea.   SLEEP ARCHITECTURE:  The patient had a total sleep time of 336 minutes with  large amounts of slow wave sleep and adequate REM. Sleep onset latency was  mildly prolonged at 37 minutes and REM onset was normal.   IMPRESSION:  1.  Small numbers of obstructive events which do not meet the RDI criteria      for the obstructive sleep apnea syndrome.  2.  Mild to moderate snoring noted during the study with large numbers of      nonspecific arousals. Consideration should be given to the upper airway      resistance syndrome.  3.  No clinically significant cardiac arrhythmias  4.  Moderate numbers of leg jerks with mild to moderate sleep disruption.      Clinical correlation is suggested  5.  If the patient is having persistent daytime sleepiness that cannot be      explained by the upper airway resistance syndrome or periodic leg      movements, would consider referral to a sleep specialist for further      evaluation.      KC/MEDQ  D:  06/09/2004 10:43:35  T:  06/09/2004 11:00:05  Job:  [id number]

## 2010-06-23 NOTE — Group Therapy Note (Signed)
[name], BURANDT               ACCOUNT NO.:  [id number]   MEDICAL RECORD NO.:  [id number]          PATIENT TYPE:  WOC   LOCATION:  [facility]                   FACILITY:  [facility]   PHYSICIAN:  Tinnie Gens, MD        DATE OF BIRTH:  [birth year]   DATE OF SERVICE:  02/10/2004                                    CLINIC NOTE   CHIEF COMPLAINT:  Follow-up.   HISTORY OF PRESENT ILLNESS:  The patient is a 35 year old gravida 2 para 2-0-  0-2 who is status post BTL and has new diagnosis of endometriosis.  She is  status post diagnostic scope.  The endometriosis was ablated.  She was on a  monophasic OC continuous since that time.  However, she had trouble bleeding  and was seen here in the first of December for that.  She has now been  started on a triphasic pill which seems to be holding her well.  She has  noted a little bit of cramping and achiness around the time of her period  and for a few days afterwards.   The patient also complains today of some abdominal distention and bloating  and some trouble with her bowels.   PHYSICAL EXAMINATION:  VITAL SIGNS:  As noted in the chart.  Her blood  pressure is 120/78, her pulse is 88, her temperature is 98.7, weight is  178.4.  GENERAL:  She is a well-developed, well-nourished white female in no acute  distress.  ABDOMEN:  Soft, nontender, nondistended.   IMPRESSION:  Endometriosis, some residual pain, but pain and bleeding fairly  well controlled with the new triphasic pill.  The patient is on Ortho Tri-  Cyclen.   PLAN:  1.  Will add Naprosyn 500 mg one p.o. b.i.d. to be taken the week of her      period, as well as for a few days after.  2.  The patient is counseled regarding good bowel health and increase in      fiber and water intake.  3.  The patient can follow up with the family practice office if her      abdominal distention and problems with her bowels continue to be a      problem.      TP/MEDQ  D:  02/10/2004  T:   02/10/2004  Job:  [id number]   cc:   Sanjuana Letters  Fax: [phone]

## 2010-06-23 NOTE — Op Note (Signed)
[name], [name]               ACCOUNT NO.:  [id number]   MEDICAL RECORD NO.:  [id number]          PATIENT TYPE:  AMB   LOCATION:  [facility]                           [facility]   PHYSICIAN:  Tanya S. Shawnie Pons, M.D.   DATE OF BIRTH:  [birth year]   DATE OF PROCEDURE:  11/01/2003  DATE OF DISCHARGE:                                 OPERATIVE REPORT   PREOPERATIVE DIAGNOSES:  Pelvic pain.   POSTOPERATIVE DIAGNOSES:  Pelvic pain and endometriosis.   PROCEDURE:  Diagnostic and operative laparoscopy with adhesiolysis,  endometriosis fulguration and cystoscopy.   SURGEON:  Shelbie Proctor. Shawnie Pons, M.D.   ANESTHESIA:  General, Raul Del, M.D.   FINDINGS:  Two patches of endometriosis along the posterior cul-de-sac.   ESTIMATED BLOOD LOSS:  Less than 25 mL.   COMPLICATIONS:  None.   REASON FOR PROCEDURE:  Briefly, the patient is a 35 year old, gravida 2,  para 2 who over the past year has developed pelvic pain and dyspareunia. She  would like a diagnosis and treatment if possible.   DESCRIPTION OF PROCEDURE:  The patient was taken to the OR where she was  placed in dorsal lithotomy in [place] Smet stirrups. The arms were tucked beside of  her. She was prepped and draped in the usual sterile fashion. A speculum was  used to locate the cervix which was grasped with a single tooth tenaculum  anteriorly and then a Hulka tenaculum placed through the cervix and a Foley  catheter was placed inside the bladder.  Attention was then turned to the  abdomen where a knife was used to make an incision through the umbilicus  which was carried down through the underlying fascia and into the peritoneal  cavity. Each edge of the fascia was tagged with a #0 Vicryl suture on a UR-  6.  A Hasson trocar was placed through this incision and CO2 used to create  pneumoperitoneum.  A lot of the pelvis appeared to be normal, however, not  all of it could be seen secondary to heavy omentum. The patient was placed  in  Trendelenburg and a port was placed in the left lower quadrant after  infiltration with 0.25% Marcaine. Actually at the very beginning __________  before the knife made the incision the umbilicus was injected with Marcaine.  A 5 mm trocar was placed under direct visualization with a bladeless trocar  without difficulty.  A second trocar was placed in the right lower quadrant.  With a blunt probe as well as with blunt graspers, the appendix, anterior  cul-de-sac, posterior cul-de-sac, right and left ovaries and tubes were all  examined and there was noted to be adhesions of the left tube to the pelvic  sidewall. These were taken down with scissors. There was also a large amount  of endometriosis in the posterior cul-de-sac on the right side and this was  fulgurated with the bipolar cautery with care being taken not to __________  the ureter. On the patient's right side, there was another small area of  endometriosis which was noted and found to be right on  top of the ureter.  For that reason, a blunt probe was then used to sort of grasp at it and  remove it as best gently as possible because we did not want to incur  bleeding or have to use the cautery around the ureter.  Irrigation was then  used at the sites of adhesiolysis as well as cautery and there was no  significant bleeding noted. The liver and omentum were observed and found to  be hemostatic. There was noted to be a small rent in the mesentery beside  a  length of bowel that was irrigated. The bowel was created, there was no  expulsion of bowel contents noted.  It was also hemostatic. The 5 mm trocars  were then removed under direct visualization and the skin closed with 4-0  Vicryl suture in a running fashion. The Hasson trocar was removed from the  umbilicus under direct visualization. The fascia closed with two figure-of-  eights here and the skin closed using 4-0 Vicryl suture in an interrupted  fashion. Sterile bandages were  placed. Attention was then turned back to the  vagina. The speculum and tenaculums were removed from the cervix. Cystoscopy  was then performed, the bladder was initially inspected. There was a small  amount of increased vasculature in the bladder noted filled to 500 mL of  saline for approximately five minutes and when this was removed a repeat  cystoscopy was performed.  There might have been an increase of a small  amount in the vasculature noted but not tremendously.      TSP/MEDQ  D:  11/01/2003  T:  11/02/2003  Job:  [id number]

## 2010-06-23 NOTE — Telephone Encounter (Signed)
Pt states she is still having hand pain that she discussed at last visit. She has tried to take Ibuprofen and Aleve for her hand pain but it caused her to have diarrhea x 2 weeks. Pt is requesting Rx for something to help with the pain. She would like rx sent to [facility] in [place]. Please advise.

## 2010-06-26 ENCOUNTER — Telehealth: Payer: Self-pay | Admitting: *Deleted

## 2010-06-26 NOTE — Telephone Encounter (Signed)
Pt left voice message stating Tylenol did not help the pain in her hand. She is requesting Rx for pain med. Also requests refill on Zofran. Please advise.

## 2010-06-26 NOTE — Telephone Encounter (Signed)
Treatment for this type of pain is NSAIDS- Aleve, or motrin.  Narcotic type meds are not indicated for this type of pain.  I would recommend that she call Dr. Lazaro Arms office for follow up appointment.

## 2010-06-27 NOTE — Telephone Encounter (Signed)
Left message on machine to return my call. 

## 2010-06-28 ENCOUNTER — Other Ambulatory Visit: Payer: Self-pay | Admitting: Family

## 2010-06-28 DIAGNOSIS — M79641 Pain in right hand: Secondary | ICD-10-CM

## 2010-06-28 MED ORDER — ONDANSETRON HCL 4 MG PO TABS: 4.0000 mg | ORAL_TABLET | Freq: Four times a day (QID) | ORAL | Status: DC | PRN

## 2010-06-28 NOTE — Telephone Encounter (Signed)
Please let patient know that I have referred her to Neurology at [facility] for her EMG.  I have also sent rx to her pharmacy for zofran.  She should be seen in the office if her nausea does not improve.  

## 2010-06-28 NOTE — Telephone Encounter (Signed)
Notified pt.  She states she contacted Dr Lazaro Arms office and they told her they would not prescribe anything until she has the nerve conduction study done on her hand. Pt states [facility] will not see her until a previous balance can be paid. Pt is requesting referral to a specialist at [facility] as they have a financial assistance program she will apply for. Pt states she is unable to take Ibuprofen due to the diarrhea it causes her, tylenol does not touch the pain in her hand. Pt reports increase nausea due to the pain and needs a refill on her Zofran sent to [facility]. Pt states she is having difficulty holding objects and is dropping them and pain has become severe. Please advise.

## 2010-06-29 NOTE — Telephone Encounter (Signed)
Pt notified and voices understanding. 

## 2010-06-29 NOTE — Telephone Encounter (Signed)
Per Sandford Craze, NP, Please let patient know that I have referred her to Neurology at [facility] for her EMG.  I have also sent rx to her pharmacy for zofran.  She should be seen in the office if her nausea does not improve.

## 2010-06-29 NOTE — Telephone Encounter (Deleted)
Please let patient know that I have referred her to Neurology at [facility] for her EMG.  I have also sent rx to her pharmacy for zofran.  She should be seen in the office if her nausea does not improve.

## 2010-06-29 NOTE — Telephone Encounter (Signed)
Pt advised.

## 2010-07-17 ENCOUNTER — Telehealth: Payer: Self-pay | Admitting: *Deleted

## 2010-07-17 NOTE — Telephone Encounter (Signed)
Received voice message from pt, crying and requesting a return call re: her hand pain. Attempted to reach pt and left message on voicemail to return my call.

## 2010-07-18 NOTE — Telephone Encounter (Signed)
She should be seen in the office for re-evaluation as it seems her symptoms have worsened.

## 2010-07-18 NOTE — Telephone Encounter (Signed)
Pt states she is not able to get in with the neurologist at [facility] until 09/14/10. Her right hand is swollen and very painful, cried most of the day yesterday. Ibuprofen is not helping. Pt states she knows a lot of the anti-inflammatory medications will interfere with her seizure meds but wants to know if there is anything else that Efraim Kaufmann knows of that she can take to help with her symptoms. Please advise.

## 2010-07-18 NOTE — Telephone Encounter (Signed)
Pt has been notified and scheduled f/u for tomorrow at 4pm.

## 2010-07-19 ENCOUNTER — Telehealth: Payer: Self-pay | Admitting: Family

## 2010-07-19 ENCOUNTER — Ambulatory Visit (INDEPENDENT_AMBULATORY_CARE_PROVIDER_SITE_OTHER): Payer: Self-pay | Admitting: Family

## 2010-07-19 ENCOUNTER — Encounter: Payer: Self-pay | Admitting: Family

## 2010-07-19 DIAGNOSIS — G5600 Carpal tunnel syndrome, unspecified upper limb: Secondary | ICD-10-CM

## 2010-07-19 MED ORDER — TRAMADOL HCL 50 MG PO TBSO: 1.0000 | ORAL_TABLET | Freq: Three times a day (TID) | ORAL | Status: DC | PRN

## 2010-07-19 MED ORDER — HYDROCODONE-ACETAMINOPHEN 5-500 MG PO TABS: 1.0000 | ORAL_TABLET | ORAL | Status: DC | PRN

## 2010-07-19 NOTE — Progress Notes (Signed)
Subjective:    Patient ID: [name], female    DOB: 03-04-1975, 35 y.o.   MRN: [id number]  HPI  [name] is a 35 yr old female who presents today for follow up of her right hand pain and swelling. She saw Dr. Pearletha Forge, who recommended EMG studies. She was unable to complete the cycle for [facility] 220 pounds on her counts. She is in the process of applying for patient assistance at [facility].Has apt for EMG at [facility] on 8/9- she is on the cancellation list. Pain is intermittent, is worsened by movement and accompanied by swelling.  Pain was slightly improved by NSAIDS but she was unable to continue this due to GI upset.  Has apt for EMG at [facility] on 8/9- she is on the cancellation list.  Review of Systems See HPI  Past Medical History  Diagnosis Date  . Seizures   . Narcolepsy   . Restless leg   . Depression   . Hyperlipidemia     History   Social History  . Marital Status: Married    Spouse Name: N/A    Number of Children: 2  . Years of Education: N/A   Occupational History  . Unemployed     Completed 12th grade   Social History Main Topics  . Smoking status: Never Smoker   . Smokeless tobacco: Never Used  . Alcohol Use: No  . Drug Use: No  . Sexually Active: Not on file   Other Topics Concern  . Not on file   Social History Narrative  . No narrative on file    No past surgical history on file.  Family History  Problem Relation Age of Onset  . Heart attack Mother   . Diabetes Father   . Heart attack Father     Allergies  Allergen Reactions  . Eggs Or Egg-Derived Products     REACTION: possible egg allergy- stomach pain, nausea/vomitting  . Metoclopramide Hcl     REACTION: Lactation  . Penicillins     REACTION: yeast infection    Current Outpatient Prescriptions on File Prior to Visit  Medication Sig Dispense Refill  . Armodafinil (NUVIGIL) 250 MG tablet Take 250 mg by mouth. Take 1 -1 1/2 tablets by mouth every morning.         . cetirizine (ZYRTEC) 10 MG tablet Take 10 mg by mouth daily.        . clonazePAM (KLONOPIN) 0.5 MG tablet Take 0.5 mg by mouth at bedtime as needed.        . diphenoxylate-atropine (LOMOTIL) 2.5-0.025 MG per tablet Take 1 tablet by mouth. Take 1 - 2 tablets by mouth every 6 hours as needed for diarrhea.       . ferrous sulfate 325 (65 FE) MG tablet Take 325 mg by mouth daily.        . fish oil-omega-3 fatty acids 1000 MG capsule Take 1 g by mouth daily.       Marland Kitchen levETIRAcetam (KEPPRA) 750 MG tablet Take 750 mg by mouth 3 (three) times daily.        . Multiple Vitamins-Minerals (WOMENS MULTI VITAMIN & MINERAL) TABS Take 1 tablet by mouth daily.        Marland Kitchen omeprazole (PRILOSEC) 20 MG capsule Take 20 mg by mouth daily.        . ondansetron (ZOFRAN) 4 MG tablet Take 1 tablet (4 mg total) by mouth every 6 (six) hours as needed.  20 tablet  0  .  REQUIP 3 MG tablet TAKE 1 TABLET NIGHTLY AS DIRECTED  30 each  1  . simvastatin (ZOCOR) 10 MG tablet Take 10 mg by mouth at bedtime.          BP 126/80  Pulse 90  Temp(Src) 98.9 F (37.2 C) (Oral)  Resp 16  Ht 5' 6.5" (1.689 m)  Wt 192 lb 1.3 oz (87.127 kg)  BMI 30.54 kg/m2       Objective:   Physical Exam  Gen. Pleasant awake, alert, overweight white female in no acute distress Extremities: Right hand is noted to be mildly swollen without erythema Neuro: Positive Tinel's sign positive Phalen's test        Assessment & Plan:

## 2010-07-19 NOTE — Assessment & Plan Note (Signed)
Deteriorated. She has been intolerant to NSAIDs. I initially recommended tramadol for short-term use however this medication will be too expensive for her she is paying out of pocket. She will be treated with a short term prescription of Vicodin. Foley after her appointment at [facility], she will have a definitive diagnosis which can be further treated.

## 2010-07-19 NOTE — Patient Instructions (Signed)
Please follow up after your appointment at [facility].

## 2010-07-26 ENCOUNTER — Telehealth: Payer: Self-pay | Admitting: *Deleted

## 2010-07-26 DIAGNOSIS — F329 Major depressive disorder, single episode, unspecified: Secondary | ICD-10-CM

## 2010-07-26 NOTE — Telephone Encounter (Signed)
I will refer her to a psychologist who is affiliated with [facility], Dr. Dellia Cloud.

## 2010-07-26 NOTE — Telephone Encounter (Signed)
Pt wants to know if there is a psychiatrist or psychologist within the [facility] network that will accept her discount. Pt does not want to see group therapy as she states they keep changing the dates of their meetings and she would prefer a Catering manager. Pt called stating she has applied for disability and they are needing records prior to 2010. Pt states she was seeing Dr Erby Pian from 2008 to 2010 and he moved and closed his practice. She states he was in the [facility] network and wanted to know if she could get his records from [place]. Spoke to [facility] in Medical Records and verified that pt could sign a records release and we could forward request to [employer] for release of records. Notified pt and she states she will file for her disability appeal with her attorney and sign records release with them which will be forwarded via Disability request to [employer]. Please advise re: psych referral.

## 2010-07-31 ENCOUNTER — Ambulatory Visit: Payer: Self-pay | Admitting: Family

## 2010-08-01 ENCOUNTER — Ambulatory Visit: Payer: Self-pay | Admitting: Psychology

## 2010-08-03 NOTE — Telephone Encounter (Signed)
Spoke with pt and verified that she has appt with psychologist next week.

## 2010-08-07 ENCOUNTER — Ambulatory Visit (INDEPENDENT_AMBULATORY_CARE_PROVIDER_SITE_OTHER): Payer: Self-pay | Admitting: Psychology

## 2010-08-07 DIAGNOSIS — F331 Major depressive disorder, recurrent, moderate: Secondary | ICD-10-CM

## 2010-08-15 ENCOUNTER — Encounter: Payer: Self-pay | Admitting: Family

## 2010-08-15 ENCOUNTER — Ambulatory Visit (INDEPENDENT_AMBULATORY_CARE_PROVIDER_SITE_OTHER): Payer: Self-pay | Admitting: Family

## 2010-08-15 ENCOUNTER — Telehealth: Payer: Self-pay | Admitting: *Deleted

## 2010-08-15 VITALS — BP 136/92 | HR 78 | Temp 98.1°F | Resp 17 | Ht 66.5 in | Wt 195.0 lb

## 2010-08-15 DIAGNOSIS — S139XXA Sprain of joints and ligaments of unspecified parts of neck, initial encounter: Secondary | ICD-10-CM

## 2010-08-15 DIAGNOSIS — S161XXA Strain of muscle, fascia and tendon at neck level, initial encounter: Secondary | ICD-10-CM

## 2010-08-15 MED ORDER — CYCLOBENZAPRINE HCL 5 MG PO TABS: 5.0000 mg | ORAL_TABLET | Freq: Three times a day (TID) | ORAL | Status: AC | PRN

## 2010-08-15 NOTE — Progress Notes (Signed)
Subjective:    Patient ID: [name], female    DOB: 09-06-1975, 35 y.o.   MRN: [id number]  HPI  [name] is a 35 yr old female who presents with complaint of left sided muscle pain.  Symptoms started last night, denies known injury.  Symptoms have worsened this AM.  She describes the pain as sore but throbbing at times.  Moving head towards the left worsens the pain.  Pain is improved slightly by the hydrocodone.  Denies previous history of similar symptoms.     Review of Systems See HPI  Past Medical History  Diagnosis Date  . Seizures   . Narcolepsy   . Restless leg   . Depression   . Hyperlipidemia     History   Social History  . Marital Status: Married    Spouse Name: N/A    Number of Children: 2  . Years of Education: N/A   Occupational History  . Unemployed     Completed 12th grade   Social History Main Topics  . Smoking status: Never Smoker   . Smokeless tobacco: Never Used  . Alcohol Use: No  . Drug Use: No  . Sexually Active: Not on file   Other Topics Concern  . Not on file   Social History Narrative  . No narrative on file    No past surgical history on file.  Family History  Problem Relation Age of Onset  . Heart attack Mother   . Diabetes Father   . Heart attack Father     Allergies  Allergen Reactions  . Eggs Or Egg-Derived Products     REACTION: possible egg allergy- stomach pain, nausea/vomitting  . Metoclopramide Hcl     REACTION: Lactation  . Penicillins     REACTION: yeast infection    Current Outpatient Prescriptions on File Prior to Visit  Medication Sig Dispense Refill  . Armodafinil (NUVIGIL) 250 MG tablet Take 250 mg by mouth. Take 1 -1 1/2 tablets by mouth every morning.       . cetirizine (ZYRTEC) 10 MG tablet Take 10 mg by mouth daily.        . clonazePAM (KLONOPIN) 0.5 MG tablet Take 0.5 mg by mouth at bedtime as needed.        . diphenoxylate-atropine (LOMOTIL) 2.5-0.025 MG per tablet Take 1 tablet by  mouth. Take 1 - 2 tablets by mouth every 6 hours as needed for diarrhea.       . ferrous sulfate 325 (65 FE) MG tablet Take 325 mg by mouth daily.        . fish oil-omega-3 fatty acids 1000 MG capsule Take 1 g by mouth daily.       Marland Kitchen HYDROcodone-acetaminophen (VICODIN) 5-500 MG per tablet Take 1 tablet by mouth every 4 (four) hours as needed for pain.  30 tablet  0  . levETIRAcetam (KEPPRA) 750 MG tablet Take 750 mg by mouth 3 (three) times daily.        . Multiple Vitamins-Minerals (WOMENS MULTI VITAMIN & MINERAL) TABS Take 1 tablet by mouth daily.        Marland Kitchen omeprazole (PRILOSEC) 20 MG capsule Take 20 mg by mouth daily.        . ondansetron (ZOFRAN) 4 MG tablet Take 1 tablet (4 mg total) by mouth every 6 (six) hours as needed.  20 tablet  0  . REQUIP 3 MG tablet TAKE 1 TABLET NIGHTLY AS DIRECTED  30 each  1  .  simvastatin (ZOCOR) 10 MG tablet Take 10 mg by mouth at bedtime.          BP 136/92  Pulse 78  Temp(Src) 98.1 F (36.7 C) (Oral)  Resp 17  Ht 5' 6.5" (1.689 m)  Wt 195 lb (88.451 kg)  BMI 31.00 kg/m2       Objective:   Physical Exam  Constitutional: She appears well-developed and well-nourished.  HENT:  Head: Normocephalic and atraumatic.  Cardiovascular: Normal rate and regular rhythm.   Pulmonary/Chest: Effort normal and breath sounds normal.  Musculoskeletal:       + tenderness to palpation of left lateral neck.  Pt has increase pain with left lateral turn of head.    Psychiatric: Her speech is normal and behavior is normal.       Mild flat affect.          Assessment & Plan:

## 2010-08-15 NOTE — Assessment & Plan Note (Signed)
Add flexeril PRN, (pt instructed not to drive while taking this medication). Unfortunately she is intolerant to NSAIDS which bother her stomach.  She was instructed to try a heating pad PRN.  Use hydrocodone PRN.

## 2010-08-15 NOTE — Patient Instructions (Signed)
Please call if your symptoms worsen or if you have not had any improvement in 1 weeks time.

## 2010-08-15 NOTE — Telephone Encounter (Signed)
Received message from pt stating she has had left sided neck pain since last night. Denies chest pain, n/v or shortness of breath. Reports pain is worse with movement. Advised pt to keep appt with [place] at 1:30 today.

## 2010-08-17 ENCOUNTER — Ambulatory Visit: Payer: Self-pay | Admitting: Psychology

## 2010-08-21 ENCOUNTER — Ambulatory Visit (INDEPENDENT_AMBULATORY_CARE_PROVIDER_SITE_OTHER): Payer: Self-pay | Admitting: Psychology

## 2010-08-21 DIAGNOSIS — F331 Major depressive disorder, recurrent, moderate: Secondary | ICD-10-CM

## 2010-08-27 ENCOUNTER — Emergency Department (HOSPITAL_COMMUNITY)
Admission: EM | Admit: 2010-08-27 | Discharge: 2010-08-28 | Disposition: A | Discharge status: Home / Self Care | Payer: Self-pay | Attending: Emergency Medicine | Admitting: Emergency Medicine

## 2010-08-27 DIAGNOSIS — F3289 Other specified depressive episodes: Secondary | ICD-10-CM | POA: Insufficient documentation

## 2010-08-27 DIAGNOSIS — G5600 Carpal tunnel syndrome, unspecified upper limb: Secondary | ICD-10-CM | POA: Insufficient documentation

## 2010-08-27 DIAGNOSIS — Z79899 Other long term (current) drug therapy: Secondary | ICD-10-CM | POA: Insufficient documentation

## 2010-08-27 DIAGNOSIS — G2581 Restless legs syndrome: Secondary | ICD-10-CM | POA: Insufficient documentation

## 2010-08-27 DIAGNOSIS — F329 Major depressive disorder, single episode, unspecified: Secondary | ICD-10-CM | POA: Insufficient documentation

## 2010-08-27 DIAGNOSIS — M25539 Pain in unspecified wrist: Secondary | ICD-10-CM | POA: Insufficient documentation

## 2010-08-27 DIAGNOSIS — G40909 Epilepsy, unspecified, not intractable, without status epilepticus: Secondary | ICD-10-CM | POA: Insufficient documentation

## 2010-08-27 DIAGNOSIS — M542 Cervicalgia: Secondary | ICD-10-CM | POA: Insufficient documentation

## 2010-08-28 ENCOUNTER — Emergency Department (HOSPITAL_COMMUNITY): Payer: Self-pay

## 2010-09-04 ENCOUNTER — Ambulatory Visit (INDEPENDENT_AMBULATORY_CARE_PROVIDER_SITE_OTHER): Payer: Self-pay | Admitting: Psychology

## 2010-09-04 DIAGNOSIS — F331 Major depressive disorder, recurrent, moderate: Secondary | ICD-10-CM

## 2010-09-13 ENCOUNTER — Ambulatory Visit: Payer: Self-pay | Admitting: Family

## 2010-09-15 ENCOUNTER — Ambulatory Visit (INDEPENDENT_AMBULATORY_CARE_PROVIDER_SITE_OTHER): Payer: Self-pay | Admitting: Psychology

## 2010-09-15 ENCOUNTER — Ambulatory Visit: Payer: Self-pay | Admitting: Family

## 2010-09-15 DIAGNOSIS — F331 Major depressive disorder, recurrent, moderate: Secondary | ICD-10-CM

## 2010-09-21 ENCOUNTER — Ambulatory Visit (INDEPENDENT_AMBULATORY_CARE_PROVIDER_SITE_OTHER): Payer: Self-pay | Admitting: Psychology

## 2010-09-21 DIAGNOSIS — F331 Major depressive disorder, recurrent, moderate: Secondary | ICD-10-CM

## 2010-09-22 ENCOUNTER — Ambulatory Visit: Payer: Self-pay | Admitting: Family

## 2010-09-22 ENCOUNTER — Encounter: Payer: Self-pay | Admitting: Family

## 2010-09-22 ENCOUNTER — Ambulatory Visit (INDEPENDENT_AMBULATORY_CARE_PROVIDER_SITE_OTHER): Payer: Self-pay | Admitting: Family

## 2010-09-22 DIAGNOSIS — F988 Other specified behavioral and emotional disorders with onset usually occurring in childhood and adolescence: Secondary | ICD-10-CM

## 2010-09-22 DIAGNOSIS — G5600 Carpal tunnel syndrome, unspecified upper limb: Secondary | ICD-10-CM

## 2010-09-22 NOTE — Progress Notes (Signed)
Subjective:    Patient ID: [name], female    DOB: Oct 31, 1975, 35 y.o.   MRN: [id number]  HPI  [name] is a 35 yr old female who presents today for follow up.  She saw Dr. Dellia Cloud yesterday and was told that she has ADD based on some testing that he performed in the office.  She is interested in persuing treatment for ADD.  In addition she is requesting a referral to audiology.  She tells me that her GPA is 1.6 at school and that she is repeating a science course for the 3rd time.    She has a nerve conduction study scheduled for 9/8 (seen at [facility]) to evaluate her hand pain.  She was told by the team at [facility] that they believe that she has carpal tunnel.   Last night had episode of light headedness 130/80.  No issues sinec.  Review of Systems see HPI    Past Medical History  Diagnosis Date  . Seizures   . Narcolepsy   . Restless leg   . Depression   . Hyperlipidemia     History   Social History  . Marital Status: Married    Spouse Name: N/A    Number of Children: 2  . Years of Education: N/A   Occupational History  . Unemployed     Completed 12th grade   Social History Main Topics  . Smoking status: Never Smoker   . Smokeless tobacco: Never Used  . Alcohol Use: No  . Drug Use: No  . Sexually Active: Not on file   Other Topics Concern  . Not on file   Social History Narrative  . No narrative on file    No past surgical history on file.  Family History  Problem Relation Age of Onset  . Heart attack Mother   . Diabetes Father   . Heart attack Father     Allergies  Allergen Reactions  . Eggs Or Egg-Derived Products     REACTION: possible egg allergy- stomach pain, nausea/vomitting  . Metoclopramide Hcl     REACTION: Lactation  . Penicillins     REACTION: yeast infection    Current Outpatient Prescriptions on File Prior to Visit  Medication Sig Dispense Refill  . Armodafinil (NUVIGIL) 250 MG tablet Take 250 mg by mouth. Take 1 -1  1/2 tablets by mouth every morning.       . cetirizine (ZYRTEC) 10 MG tablet Take 10 mg by mouth daily.        . clonazePAM (KLONOPIN) 0.5 MG tablet Take 0.5 mg by mouth at bedtime as needed.        . ferrous sulfate 325 (65 FE) MG tablet Take 325 mg by mouth daily.        . fish oil-omega-3 fatty acids 1000 MG capsule Take 1 g by mouth daily.       Marland Kitchen levETIRAcetam (KEPPRA) 750 MG tablet Take 750 mg by mouth 3 (three) times daily.        . Multiple Vitamins-Minerals (WOMENS MULTI VITAMIN & MINERAL) TABS Take 1 tablet by mouth daily.        Marland Kitchen omeprazole (PRILOSEC) 20 MG capsule Take 20 mg by mouth daily.        . ondansetron (ZOFRAN) 4 MG tablet Take 1 tablet (4 mg total) by mouth every 6 (six) hours as needed.  20 tablet  0  . REQUIP 3 MG tablet TAKE 1 TABLET NIGHTLY AS DIRECTED  30 each  1  . diphenoxylate-atropine (LOMOTIL) 2.5-0.025 MG per tablet Take 1 tablet by mouth. Take 1 - 2 tablets by mouth every 6 hours as needed for diarrhea.       . simvastatin (ZOCOR) 10 MG tablet Take 10 mg by mouth at bedtime.          BP 122/82  Pulse 88  Temp(Src) 98.4 F (36.9 C) (Oral)  Ht 5' 6.5" (1.689 m)  Wt 190 lb (86.183 kg)  BMI 30.21 kg/m2    Objective:   Physical Exam  Constitutional: She appears well-developed and well-nourished. No distress.  HENT:  Head: Normocephalic and atraumatic.  Pulmonary/Chest: Effort normal and breath sounds normal. No respiratory distress. She has no wheezes. She has no rales. She exhibits no tenderness.  Abdominal: Soft. Bowel sounds are normal.  Skin: Skin is warm and dry.  Psychiatric: She has a normal mood and affect. Her behavior is normal. Thought content normal.          Assessment & Plan:

## 2010-09-22 NOTE — Patient Instructions (Signed)
Please follow up in 2 months.   We will contact you about your referral to audiology and after we receive the results from Dr. Dellia Cloud.

## 2010-09-23 DIAGNOSIS — F988 Other specified behavioral and emotional disorders with onset usually occurring in childhood and adolescence: Secondary | ICD-10-CM | POA: Insufficient documentation

## 2010-09-23 NOTE — Assessment & Plan Note (Signed)
Will request evaluation from Dr. Dellia Cloud and consider initiating medical therapy.

## 2010-09-23 NOTE — Assessment & Plan Note (Signed)
She is scheduled for a nerve conduction study at [facility].

## 2010-09-26 ENCOUNTER — Telehealth: Payer: Self-pay | Admitting: *Deleted

## 2010-09-26 MED ORDER — TRAMADOL HCL 50 MG PO TABS: 50.0000 mg | ORAL_TABLET | Freq: Four times a day (QID) | ORAL | Status: AC | PRN

## 2010-09-26 NOTE — Telephone Encounter (Signed)
Pt returned my call and has been notified. 

## 2010-09-26 NOTE — Telephone Encounter (Signed)
Received call from pt requesting a refill on Tramadol for her hand pain be sent to [facility]. Reports that it was prescribed from [facility] and it seemed to work better than the Vicodin.  Please advise.

## 2010-09-26 NOTE — Telephone Encounter (Signed)
Rx has been sent  

## 2010-09-26 NOTE — Telephone Encounter (Signed)
Left message with pt's husband for pt to return my call.  

## 2010-09-27 ENCOUNTER — Ambulatory Visit (INDEPENDENT_AMBULATORY_CARE_PROVIDER_SITE_OTHER): Payer: Self-pay | Admitting: Psychology

## 2010-09-27 ENCOUNTER — Ambulatory Visit: Payer: Self-pay | Attending: Family | Admitting: Audiology

## 2010-09-27 DIAGNOSIS — F802 Mixed receptive-expressive language disorder: Secondary | ICD-10-CM | POA: Insufficient documentation

## 2010-09-27 DIAGNOSIS — F331 Major depressive disorder, recurrent, moderate: Secondary | ICD-10-CM

## 2010-09-28 ENCOUNTER — Ambulatory Visit: Payer: Self-pay | Admitting: Psychology

## 2010-10-03 ENCOUNTER — Telehealth: Payer: Self-pay | Admitting: *Deleted

## 2010-10-03 NOTE — Telephone Encounter (Signed)
Message copied by Kathi Simpers on Tue Oct 03, 2010 11:37 AM ------      Message from: Mervin Kung A      Created: Mon Oct 02, 2010  1:13 PM       Left message on Ona Roehrs's [phone]) voicemail to send records as requested below and to call if any questions.            ----- Message -----         From: Katrinka Blazing. Peggyann Juba, NP         Sent: 09/23/2010   8:36 PM           To: Mervin Kung, CMA            Could you pls request paperwork from Dr. Dellia Cloud [facility]) from pt's last visit regarding evaluation for ADD? thanks

## 2010-10-03 NOTE — Telephone Encounter (Signed)
Record received and forwarded to Provider for review. 

## 2010-10-06 ENCOUNTER — Ambulatory Visit (INDEPENDENT_AMBULATORY_CARE_PROVIDER_SITE_OTHER): Payer: Self-pay | Admitting: Psychology

## 2010-10-06 DIAGNOSIS — F331 Major depressive disorder, recurrent, moderate: Secondary | ICD-10-CM

## 2010-10-06 NOTE — Telephone Encounter (Signed)
Correction to previous documentation: records were not received from Dr Dellia Cloud. Spoke to [place] and verified that she received my previous message and has sent the request to Dr Dellia Cloud.

## 2010-10-10 ENCOUNTER — Telehealth: Payer: Self-pay | Admitting: Family

## 2010-10-10 ENCOUNTER — Other Ambulatory Visit: Payer: Self-pay | Admitting: Family

## 2010-10-10 MED ORDER — ROPINIROLE HCL 3 MG PO TABS: 3.0000 mg | ORAL_TABLET | Freq: Every day | ORAL | Status: DC

## 2010-10-10 NOTE — Telephone Encounter (Signed)
Refill- ropinirole hcl 3mg  tablet. Take one tablet nightly as directed. Qty 30 last fill 8.3.12

## 2010-10-10 NOTE — Telephone Encounter (Signed)
Ropinirole 3mg  1 tablet nightly as needed. #30 x no refills sent to pharmacy.

## 2010-10-11 ENCOUNTER — Encounter: Payer: Self-pay | Admitting: Family

## 2010-10-11 ENCOUNTER — Ambulatory Visit: Payer: Self-pay | Admitting: Family

## 2010-10-11 ENCOUNTER — Ambulatory Visit (INDEPENDENT_AMBULATORY_CARE_PROVIDER_SITE_OTHER): Payer: Self-pay | Admitting: Family

## 2010-10-11 VITALS — BP 100/80 | HR 66 | Temp 98.0°F | Resp 16 | Ht 66.5 in | Wt 190.0 lb

## 2010-10-11 DIAGNOSIS — H9209 Otalgia, unspecified ear: Secondary | ICD-10-CM

## 2010-10-11 DIAGNOSIS — H9203 Otalgia, bilateral: Secondary | ICD-10-CM

## 2010-10-11 DIAGNOSIS — J029 Acute pharyngitis, unspecified: Secondary | ICD-10-CM

## 2010-10-11 MED ORDER — FLUCONAZOLE 150 MG PO TABS: ORAL_TABLET | ORAL | Status: DC

## 2010-10-11 MED ORDER — AMOXICILLIN 875 MG PO TABS: 875.0000 mg | ORAL_TABLET | Freq: Two times a day (BID) | ORAL | Status: AC

## 2010-10-11 NOTE — Assessment & Plan Note (Signed)
Possible early bilateral OM.  Given worsening symptoms after 7 days of illness will plan to treat with amoxicillin.  Rapid strep is negative today.

## 2010-10-11 NOTE — Progress Notes (Signed)
Subjective:    Patient ID: [name], female    DOB: 19-Jan-1976, 35 y.o.   MRN: [id number]  HPI  [name] is a 35 yr old female who presents today with chief complaint of ear pain and sore throat.  Symptoms started 7 days ago.  Improved by nothing.  She reports that symptoms are worsening. Notes associated hoarseness. Denies fever, nausea, vomitting or nasal discharge.    Review of Systems see HPI    Past Medical History  Diagnosis Date  . Seizures   . Narcolepsy   . Restless leg   . Depression   . Hyperlipidemia     History   Social History  . Marital Status: Married    Spouse Name: N/A    Number of Children: 2  . Years of Education: N/A   Occupational History  . Unemployed     Completed 12th grade   Social History Main Topics  . Smoking status: Never Smoker   . Smokeless tobacco: Never Used  . Alcohol Use: No  . Drug Use: No  . Sexually Active: Not on file   Other Topics Concern  . Not on file   Social History Narrative  . No narrative on file    No past surgical history on file.  Family History  Problem Relation Age of Onset  . Heart attack Mother   . Diabetes Father   . Heart attack Father     Allergies  Allergen Reactions  . Eggs Or Egg-Derived Products     REACTION: possible egg allergy- stomach pain, nausea/vomitting  . Metoclopramide Hcl     REACTION: Lactation  . Penicillins     REACTION: yeast infection    Current Outpatient Prescriptions on File Prior to Visit  Medication Sig Dispense Refill  . Armodafinil (NUVIGIL) 250 MG tablet Take 250 mg by mouth. Take 1 -1 1/2 tablets by mouth every morning.       . cetirizine (ZYRTEC) 10 MG tablet Take 10 mg by mouth daily.        . clonazePAM (KLONOPIN) 0.5 MG tablet Take 0.5 mg by mouth at bedtime as needed.        . diphenoxylate-atropine (LOMOTIL) 2.5-0.025 MG per tablet Take 1 tablet by mouth. Take 1 - 2 tablets by mouth every 6 hours as needed for diarrhea.       . ferrous  sulfate 325 (65 FE) MG tablet Take 325 mg by mouth daily.        . fish oil-omega-3 fatty acids 1000 MG capsule Take 1 g by mouth daily.       Marland Kitchen levETIRAcetam (KEPPRA) 750 MG tablet Take 750 mg by mouth 3 (three) times daily.        . Multiple Vitamins-Minerals (WOMENS MULTI VITAMIN & MINERAL) TABS Take 1 tablet by mouth daily.        Marland Kitchen omeprazole (PRILOSEC) 20 MG capsule Take 20 mg by mouth daily.        . ondansetron (ZOFRAN) 4 MG tablet Take 1 tablet (4 mg total) by mouth every 6 (six) hours as needed.  20 tablet  0  . rOPINIRole (REQUIP) 3 MG tablet Take 1 tablet (3 mg total) by mouth at bedtime.  30 tablet  1  . simvastatin (ZOCOR) 10 MG tablet Take 10 mg by mouth at bedtime.          BP 100/80  Pulse 66  Temp(Src) 98 F (36.7 C) (Oral)  Resp 16  Ht  5' 6.5" (1.689 m)  Wt 190 lb (86.183 kg)  BMI 30.21 kg/m2    Objective:   Physical Exam  Constitutional: She appears well-developed and well-nourished.  HENT:  Head: Normocephalic and atraumatic.  Right Ear: Tympanic membrane is not injected and not erythematous. A middle ear effusion is present.  Left Ear: Tympanic membrane is not injected and not erythematous. A middle ear effusion is present.  Mouth/Throat: Uvula is midline and oropharynx is clear and moist. Mucous membranes are dry. No oropharyngeal exudate, posterior oropharyngeal edema or posterior oropharyngeal erythema.  Eyes: Conjunctivae are normal. Pupils are equal, round, and reactive to light.  Cardiovascular: Normal rate, regular rhythm, S1 normal and S2 normal.   Pulmonary/Chest: Effort normal and breath sounds normal.  Psychiatric: She has a normal mood and affect. Her behavior is normal. Judgment and thought content normal.          Assessment & Plan:

## 2010-10-11 NOTE — Patient Instructions (Signed)
Call if your symptoms worsen or if no improvement in 2-3 days.  

## 2010-10-17 ENCOUNTER — Ambulatory Visit (INDEPENDENT_AMBULATORY_CARE_PROVIDER_SITE_OTHER): Payer: Self-pay | Admitting: Family

## 2010-10-17 ENCOUNTER — Encounter: Payer: Self-pay | Admitting: Family

## 2010-10-17 DIAGNOSIS — R05 Cough: Secondary | ICD-10-CM

## 2010-10-17 NOTE — Progress Notes (Signed)
Subjective:    Patient ID: [name], female    DOB: 1975/10/10, 35 y.o.   MRN: [id number]  HPI  [name] is a 35 yr old female who presents today for follow up.  She was seen on 9/5 for otalgia and was placed on Amoxicillin.  She is still completing her amoxicillin rx.  Today she presents witch chief complaint of cough.  She reports that her ear pain is resolved. Notes associated chest soreness and difficulty sleeping due to cough.  Cough is dry and worsened by laying flat.  It is improved by sitting upright in a recliner.     Review of Systems See HPI  Past Medical History  Diagnosis Date  . Seizures   . Narcolepsy   . Restless leg   . Depression   . Hyperlipidemia     History   Social History  . Marital Status: Married    Spouse Name: N/A    Number of Children: 2  . Years of Education: N/A   Occupational History  . Unemployed     Completed 12th grade   Social History Main Topics  . Smoking status: Never Smoker   . Smokeless tobacco: Never Used  . Alcohol Use: No  . Drug Use: No  . Sexually Active: Not on file   Other Topics Concern  . Not on file   Social History Narrative  . No narrative on file    No past surgical history on file.  Family History  Problem Relation Age of Onset  . Heart attack Mother   . Diabetes Father   . Heart attack Father     Allergies  Allergen Reactions  . Eggs Or Egg-Derived Products     REACTION: possible egg allergy- stomach pain, nausea/vomitting  . Metoclopramide Hcl     REACTION: Lactation  . Penicillins     REACTION: yeast infection    Current Outpatient Prescriptions on File Prior to Visit  Medication Sig Dispense Refill  . amoxicillin (AMOXIL) 875 MG tablet Take 1 tablet (875 mg total) by mouth 2 (two) times daily.  20 tablet  0  . Armodafinil (NUVIGIL) 250 MG tablet Take 250 mg by mouth. Take 1 -1 1/2 tablets by mouth every morning.       . clonazePAM (KLONOPIN) 0.5 MG tablet Take 0.5 mg by mouth at  bedtime as needed.        . diphenoxylate-atropine (LOMOTIL) 2.5-0.025 MG per tablet Take 1 tablet by mouth. Take 1 - 2 tablets by mouth every 6 hours as needed for diarrhea.       . ferrous sulfate 325 (65 FE) MG tablet Take 325 mg by mouth daily.        . fish oil-omega-3 fatty acids 1000 MG capsule Take 1 g by mouth daily.       . fluconazole (DIFLUCAN) 150 MG tablet Take as needed for vaginal itching.  May repeat in 3 days if needed.  2 tablet  0  . levETIRAcetam (KEPPRA) 750 MG tablet Take 750 mg by mouth 3 (three) times daily.        . Multiple Vitamins-Minerals (WOMENS MULTI VITAMIN & MINERAL) TABS Take 1 tablet by mouth daily.        Marland Kitchen omeprazole (PRILOSEC) 20 MG capsule Take 20 mg by mouth daily.        . ondansetron (ZOFRAN) 4 MG tablet Take 1 tablet (4 mg total) by mouth every 6 (six) hours as needed.  20  tablet  0  . rOPINIRole (REQUIP) 3 MG tablet Take 1 tablet (3 mg total) by mouth at bedtime.  30 tablet  1  . simvastatin (ZOCOR) 10 MG tablet Take 10 mg by mouth at bedtime.          BP 122/86  Pulse 88  Temp(Src) 98.3 F (36.8 C) (Oral)  Resp 18  Ht 5' 6.5" (1.689 m)  Wt 191 lb 1.3 oz (86.673 kg)  BMI 30.38 kg/m2  SpO2 97%       Objective:   Physical Exam  Constitutional: She appears well-developed and well-nourished.       Clearing throat regularly during exam.   HENT:  Head: Normocephalic and atraumatic.  Mouth/Throat: No oropharyngeal exudate.       + mild voice hoarseness is noted.  R TM is pink without bulging, L TM is dull.   Cardiovascular: Normal rate and regular rhythm.   No murmur heard. Pulmonary/Chest: Effort normal and breath sounds normal. No respiratory distress. She has no wheezes. She has no rales. She exhibits no tenderness.  Psychiatric: She has a normal mood and affect. Her behavior is normal. Judgment and thought content normal.          Assessment & Plan:  2 maa 8 feb 204 nasonex samples #2

## 2010-10-17 NOTE — Assessment & Plan Note (Signed)
I suspect that her cough and symptoms are due to allergic rhinitis/post-nasal drip.  Will plan to treat with Nasonex and Zyrtec D. She is instructed to complete her amoxicillin.

## 2010-10-17 NOTE — Patient Instructions (Signed)
Call if worsening cough, fever over 100, or if symptoms are not improved in 1 week.

## 2010-10-19 ENCOUNTER — Telehealth: Payer: Self-pay | Admitting: Family

## 2010-10-19 ENCOUNTER — Ambulatory Visit (INDEPENDENT_AMBULATORY_CARE_PROVIDER_SITE_OTHER): Payer: Self-pay | Admitting: Psychology

## 2010-10-19 DIAGNOSIS — F331 Major depressive disorder, recurrent, moderate: Secondary | ICD-10-CM

## 2010-10-19 DIAGNOSIS — G5600 Carpal tunnel syndrome, unspecified upper limb: Secondary | ICD-10-CM

## 2010-10-19 DIAGNOSIS — G40909 Epilepsy, unspecified, not intractable, without status epilepticus: Secondary | ICD-10-CM

## 2010-10-19 NOTE — Telephone Encounter (Signed)
Patient states that [name] referred her to neurologist for a nerve conduction study. Patient called in stating that her appointment is not until 10/13. She wants to know if there is anything we can do to get her appointment moved up?

## 2010-10-19 NOTE — Telephone Encounter (Signed)
Yes, I will arrange her to see Dr. Modesto Charon the new Neurologist at [facility].  I will also ask for him to follow her for her seizure disorder as he is part of [facility].

## 2010-10-19 NOTE — Telephone Encounter (Signed)
Spoke to pt and she states that the neurology office at [facility] has rescheduled her NCV study 3 times. She had to r/s once. Pt states they have moved her appt out until 11/18/10. Pt is requesting referral to see the neurologist with [facility]. Please advise.

## 2010-10-20 ENCOUNTER — Telehealth: Payer: Self-pay | Admitting: Family

## 2010-10-20 NOTE — Telephone Encounter (Signed)
Pt notified. Advised pt to call us back Wednesday if she hasn't received a call re: appt.

## 2010-10-20 NOTE — Telephone Encounter (Signed)
Spoke with pt.  Will review assessment from Dr. Dawayne Cirri office which she brings with her today. Also awaiting call back from Dr. Dellia Cloud.

## 2010-10-20 NOTE — Telephone Encounter (Signed)
Left message on machine [phone] requesting call back to discuss his assessment RE: ADD.

## 2010-10-23 ENCOUNTER — Ambulatory Visit (INDEPENDENT_AMBULATORY_CARE_PROVIDER_SITE_OTHER): Payer: Self-pay | Admitting: Family

## 2010-10-23 ENCOUNTER — Telehealth: Payer: Self-pay | Admitting: Family

## 2010-10-23 ENCOUNTER — Encounter: Payer: Self-pay | Admitting: Family

## 2010-10-23 ENCOUNTER — Ambulatory Visit: Payer: Self-pay | Admitting: Neurology

## 2010-10-23 ENCOUNTER — Ambulatory Visit (HOSPITAL_BASED_OUTPATIENT_CLINIC_OR_DEPARTMENT_OTHER)
Admission: RE | Admit: 2010-10-23 | Discharge: 2010-10-23 | Disposition: A | Discharge status: Home / Self Care | Payer: Self-pay | Source: Ambulatory Visit | Attending: Family | Admitting: Family

## 2010-10-23 VITALS — BP 124/72 | HR 78 | Temp 98.1°F | Resp 16 | Wt 189.1 lb

## 2010-10-23 DIAGNOSIS — R0602 Shortness of breath: Secondary | ICD-10-CM | POA: Insufficient documentation

## 2010-10-23 DIAGNOSIS — R059 Cough, unspecified: Secondary | ICD-10-CM | POA: Insufficient documentation

## 2010-10-23 DIAGNOSIS — R05 Cough: Secondary | ICD-10-CM

## 2010-10-23 NOTE — Progress Notes (Signed)
Subjective:    Patient ID: [name], female    DOB: Jan 19, 1976, 35 y.o.   MRN: [id number]  HPI  [name] is a 35 yr old female who presents today with chief complaint of cough.  She was originally seen on 9/5 with complaint of sore throat.  Rapid strep was negative at that time.  She was treated empirically with amoxicillin.  She was seen again for follow up on 9/11 for cough and nasal congestion. Amoxaicillin was continued and  nasonex and zyrtec were started.  She reports that her cough symptoms have worsened. Up all last night.  Denies fever but feels hot.  Cough is dry at times and productive at times of clear sputum.  She has some associated urinary incontence.      Review of Systems See HPI  Past Medical History  Diagnosis Date  . Seizures   . Narcolepsy   . Restless leg   . Depression   . Hyperlipidemia     History   Social History  . Marital Status: Married    Spouse Name: N/A    Number of Children: 2  . Years of Education: N/A   Occupational History  . Unemployed     Completed 12th grade   Social History Main Topics  . Smoking status: Never Smoker   . Smokeless tobacco: Never Used  . Alcohol Use: No  . Drug Use: No  . Sexually Active: Not on file   Other Topics Concern  . Not on file   Social History Narrative  . No narrative on file    No past surgical history on file.  Family History  Problem Relation Age of Onset  . Heart attack Mother   . Diabetes Father   . Heart attack Father     Allergies  Allergen Reactions  . Eggs Or Egg-Derived Products     REACTION: possible egg allergy- stomach pain, nausea/vomitting  . Metoclopramide Hcl     REACTION: Lactation  . Penicillins     REACTION: yeast infection    Current Outpatient Prescriptions on File Prior to Visit  Medication Sig Dispense Refill  . Armodafinil (NUVIGIL) 250 MG tablet Take 250 mg by mouth. Take 1 -1 1/2 tablets by mouth every morning.       .  cetirizine-pseudoephedrine (ZYRTEC-D) 5-120 MG per tablet 1 tablet.        . clonazePAM (KLONOPIN) 0.5 MG tablet Take 0.5 mg by mouth at bedtime as needed.        . diphenoxylate-atropine (LOMOTIL) 2.5-0.025 MG per tablet Take 1 tablet by mouth. Take 1 - 2 tablets by mouth every 6 hours as needed for diarrhea.       . ferrous sulfate 325 (65 FE) MG tablet Take 325 mg by mouth daily.        . fish oil-omega-3 fatty acids 1000 MG capsule Take 1 g by mouth daily.       . fluconazole (DIFLUCAN) 150 MG tablet Take as needed for vaginal itching.  May repeat in 3 days if needed.  2 tablet  0  . levETIRAcetam (KEPPRA) 750 MG tablet Take 750 mg by mouth 3 (three) times daily.        . mometasone (NASONEX) 50 MCG/ACT nasal spray Place 2 sprays into the nose daily.       . Multiple Vitamins-Minerals (WOMENS MULTI VITAMIN & MINERAL) TABS Take 1 tablet by mouth daily.        Marland Kitchen omeprazole (PRILOSEC)  20 MG capsule Take 20 mg by mouth daily.        . ondansetron (ZOFRAN) 4 MG tablet Take 1 tablet (4 mg total) by mouth every 6 (six) hours as needed.  20 tablet  0  . rOPINIRole (REQUIP) 3 MG tablet Take 1 tablet (3 mg total) by mouth at bedtime.  30 tablet  1  . simvastatin (ZOCOR) 10 MG tablet Take 10 mg by mouth at bedtime.          BP 124/72  Pulse 78  Temp(Src) 98.1 F (36.7 C) (Oral)  Resp 16  Wt 189 lb 1.3 oz (85.766 kg)  SpO2 99%       Objective:   Physical Exam  Constitutional: She is oriented to person, place, and time. She appears well-developed and well-nourished.  Cardiovascular: Normal rate and regular rhythm.   No murmur heard. Pulmonary/Chest: Effort normal and breath sounds normal. No respiratory distress. She has no wheezes. She has no rales.  Neurological: She is alert and oriented to person, place, and time.  Skin: Skin is warm and dry.  Psychiatric: She has a normal mood and affect. Her behavior is normal. Judgment and thought content normal.          Assessment & Plan:    advair 250 lot [id number] #2

## 2010-10-23 NOTE — Telephone Encounter (Signed)
Reviewed CXR- negative.  Left message on home machine requesting call back.  When she calls back, pls let her know that her chest x-ray was neg and she should continue with prilosec and advair, follow up in 1 week.

## 2010-10-23 NOTE — Patient Instructions (Signed)
Restart prilosec, continue nasonex. Start advair and continue for the next 2 weeks.  Complete your chest x-ray on the first floor today.   Follow up in 1 week.

## 2010-10-24 ENCOUNTER — Telehealth: Payer: Self-pay | Admitting: *Deleted

## 2010-10-24 NOTE — Telephone Encounter (Signed)
Reviewed neg chest x-ray with pt.  She reports that she "can't stop coughing."  I recommended that she try adding albuterol- she has MDI at home.

## 2010-10-24 NOTE — Telephone Encounter (Signed)
Pt called requesting results of xray taken yesterday. Please advise.

## 2010-10-25 ENCOUNTER — Telehealth: Payer: Self-pay | Admitting: *Deleted

## 2010-10-25 MED ORDER — HYDROCOD POLST-CHLORPHEN POLST 10-8 MG/5ML PO LQCR: 5.0000 mL | Freq: Two times a day (BID) | ORAL | Status: DC | PRN

## 2010-10-25 NOTE — Telephone Encounter (Signed)
Notified pt per directions below. Pt has f/u on Monday.  Pt requests note for school for this week as she is still sick. States her coughing spells cause her bladder to leak. Also requests Rx for cough to be sent to [facility] in [place]. States she is unable to sleep due to cough. Has taken Robitussin and Tylenol cold and Flu without relief of cough. Please advise.   O'SULLIVAN,[name] S., NP 10/23/2010 5:06 PM Signed  Reviewed CXR- negative. Left message on home machine requesting call back. When she calls back, pls let her know that her chest x-ray was neg and she should continue with prilosec and advair, follow up in 1 week.

## 2010-10-25 NOTE — Telephone Encounter (Signed)
Pls fax rx for Tussionex to pharmacy.

## 2010-10-25 NOTE — Telephone Encounter (Signed)
Per Efraim Kaufmann, ok to provide school note for this week. Notified pt and she states her husband will pick up rx and note. Letters left at front desk for pick up.

## 2010-10-30 ENCOUNTER — Ambulatory Visit: Payer: Self-pay | Admitting: Family

## 2010-10-30 NOTE — Assessment & Plan Note (Signed)
I suspect that her cough at this point is due to more of a reactive component than infectious.  Will have her restart nasonex.  Add Advair, resume PPI.  Pt to follow up in 1 week.

## 2010-10-31 ENCOUNTER — Ambulatory Visit (INDEPENDENT_AMBULATORY_CARE_PROVIDER_SITE_OTHER): Payer: Self-pay | Admitting: Neurology

## 2010-10-31 ENCOUNTER — Encounter: Payer: Self-pay | Admitting: Neurology

## 2010-10-31 VITALS — BP 122/80 | HR 76 | Wt 191.0 lb

## 2010-10-31 DIAGNOSIS — R569 Unspecified convulsions: Secondary | ICD-10-CM

## 2010-10-31 MED ORDER — GABAPENTIN 300 MG PO CAPS: 300.0000 mg | ORAL_CAPSULE | Freq: Three times a day (TID) | ORAL | Status: DC

## 2010-10-31 NOTE — Patient Instructions (Addendum)
Gabapentin 300mg  tabs  Take 1 at night for 1 week, Then 1 twice a day for 1 week, Then 1 three times a day from then on.  Your EEG appointment is scheduled for Tuesday, October 2nd at 2:30pm.  Please arrive to [facility] by 2:15pm  Keep your appointment at [facility] for the NCS/EMG.

## 2010-10-31 NOTE — Progress Notes (Signed)
Dear Ms. Peggyann Juba,  Thank you for having me see [name] in consultation today at [facility] for her problem with right hand pain, seizures and narcolepsy.  As you may recall, she is a 35 y.o. year old female with a history of depression who apparently has had seizures since 2009.  She has had only two generalized tonic clonic events -- these occurred during the day -- while she says that she continues to have frequent events at night.  Her last "seizure" was one month ago.  She is on Keppra 750mg  tid.  Previously she has been on phenobarbital and carbamazepine.    The reason she says she knows she has night events is that she typically awakes haven bitten her tongue.    She tolerates the Keppra and has not been on a higher dose.  She was originally followed by [facility].  She is unable to continue to follow with them because she does not have medical insurance.    A previous EEG done in March of 2011 at [facility] revealed 2.5 to 3 cps  GSW activity on EEG.  In addition, similar activity was seen on an overnight sleep study.  She is also treated for narcolepsy diagnosed by Dr. Porfirio Mylar Dohmeier at [facility].  This was based on the finding that during a MSLT where she had 5 of 5 naps with REM recorded.  She currently is treated with modafinil but has not been able to tolerate higher doses.  She was also treated for restless leg syndrome at [facility] as well.  Her biggest complaint today is that she has pain and numbness in her right hand, shooting into all her fingers.  Apparently has been given a diagnosis of carpal tunnel syndrome but has not had any intervention other than a splint.  She does not have any accompanying neck pain and it does not get worse with neck movement.   Medical History:  1.  Depression 2.  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  ?ADHD 5.  RLS   Surgical History:  Appendecomy, hysterectomy.   Social History: No  tob, no EtOH.  Family History: No history of neurologic diseases.  Meds: 1.  armodafinil 250mg  daily 2.  Clonazepam 0.5mg  prn 3.  Keppra 750 tabs 1/1/1 4.  ropinirole 3mg  qhs 5.  Tramadol 50mg  prn    ROS:  13 systems were reviewed and are notable for difficulty with concentration and depression.  All other review of systems are unremarkable.   Examination:  Filed Vitals:   10/31/10 1347  BP: 122/80  Pulse: 76  Weight: 191 lb (86.637 kg)     In general, mildly obese appearing woman in no acute distress.  Cardiovascular: The patient has a regular rate and rhythm and no carotid bruits.  Fundoscopy:  Disks are flat. Vessel caliber within normal limits.  Mental status:   The patient is oriented to person, place and time. Recent and remote memory are intact. Attention span and concentration are normal. Language including repetition, naming, following commands are intact. Fund of knowledge of current and historical events, as well as vocabulary are normal.  Cranial Nerves: Pupils are equally round and reactive to light. Visual fields full to confrontation. Extraocular movements are intact without nystagmus. Facial sensation and muscles of mastication are intact. Muscles of facial expression are symmetric. Hearing intact to bilateral finger rub. Tongue protrusion, uvula, palate midline.  Shoulder shrug intact  Motor:  The patient has normal bulk  and tone, no pronator drift and 5/5 strength bilaterally.  There are no adventitious movements.  Reflexes:  Are 2+ bilaterally in both the upper and lower extremities.    Coordination:  Normal finger to nose.  No dysdiadokinesia.  Sensation is intact to temperature and vibration except possibly decreased in right hand index and middle finger.  Gait and Station are normal.  Tandem gait is intact.  Romberg is negative.  Old records from [facility] were reviewed including MSLT and EEG reports.   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  Frankly, I am a little suspicious mostly of the narcolepsy diagnosis, particularly if the patient has a concurrent IGE.  However, for now I am going to treat her as having these diagnosis particularly given the report of the EEG and MSLT.    Recommendations: 1.  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  She is due to go to [facility] for an EMG/NCS to confirm the diagnosis.  However, if confirmed we will have to try to find a surgeon that would do the release.  She may need to get this done at an academic institution. 2.  Possible IGE - I am going to repeat her EEG.  I am not going to increase her  Keppra for now, but will instead hold off until I get the results of the EEG.  I am still not convinced the patient is having ongoing seizures, as these only happen at night and their is only circumstantial evidence.    We are going to fill out her patient assistance paper work for Keppra XR when she brings it in. 3.  Possible narcolepsy - This will take further clinical questioning re hypnopompic and hypnogogic hallucinations, etc.  Will continue Nuvigil for now  Thank you for having [place] see [name] in consultation.  Feel free to contact me with any questions.  Lupita Raider Modesto Charon, MD [facility], [place] [address] [place], [place] [address] Phone: [phone] Fax: [phone].

## 2010-11-01 ENCOUNTER — Encounter: Payer: Self-pay | Admitting: Family

## 2010-11-01 ENCOUNTER — Ambulatory Visit (INDEPENDENT_AMBULATORY_CARE_PROVIDER_SITE_OTHER): Payer: Self-pay | Admitting: Family

## 2010-11-01 ENCOUNTER — Encounter: Payer: Self-pay | Admitting: *Deleted

## 2010-11-01 VITALS — BP 120/84 | HR 72 | Temp 98.7°F | Resp 16 | Ht 66.5 in | Wt 190.0 lb

## 2010-11-01 DIAGNOSIS — R05 Cough: Secondary | ICD-10-CM

## 2010-11-01 DIAGNOSIS — R41840 Attention and concentration deficit: Secondary | ICD-10-CM

## 2010-11-01 DIAGNOSIS — F988 Other specified behavioral and emotional disorders with onset usually occurring in childhood and adolescence: Secondary | ICD-10-CM

## 2010-11-01 MED ORDER — FLUTICASONE-SALMETEROL 115-21 MCG/ACT IN AERO: 2.0000 | INHALATION_SPRAY | Freq: Two times a day (BID) | RESPIRATORY_TRACT | Status: DC

## 2010-11-01 MED ORDER — BENZONATATE 100 MG PO CAPS: 100.0000 mg | ORAL_CAPSULE | Freq: Three times a day (TID) | ORAL | Status: AC | PRN

## 2010-11-01 NOTE — Patient Instructions (Addendum)
You will be contacted about your referral to see Dr. Vassie Loll.

## 2010-11-01 NOTE — Assessment & Plan Note (Signed)
Will plan to refer to psychiatry for further evaluation.  I am not comfortable starting her on Adderall with her already being on nuvigil.

## 2010-11-01 NOTE — Progress Notes (Signed)
Subjective:    Patient ID: [name], female    DOB: Oct 17, 1975, 35 y.o.   MRN: [id number]  HPI  [name] is a 35 yr old female who presents today for follow up of her cough. Last visit she was placed on advair and PPI, nasonex, advair. Throat is sore, using cough drops, mild shortness of breath.   Urinary incontinence with coughing.   Review of Systems    see HPI  Past Medical History  Diagnosis Date  . Seizures   . Narcolepsy   . Restless leg   . Depression   . Hyperlipidemia     History   Social History  . Marital Status: Married    Spouse Name: N/A    Number of Children: 2  . Years of Education: N/A   Occupational History  . Unemployed     Completed 12th grade   Social History Main Topics  . Smoking status: Never Smoker   . Smokeless tobacco: Never Used  . Alcohol Use: No  . Drug Use: No  . Sexually Active: Not on file   Other Topics Concern  . Not on file   Social History Narrative  . No narrative on file    Past Surgical History  Procedure Date  . Partial hysterectomy 2006  . Appendectomy 2006    Family History  Problem Relation Age of Onset  . Heart attack Mother   . Diabetes Father   . Heart attack Father     Allergies  Allergen Reactions  . Eggs Or Egg-Derived Products     REACTION: possible egg allergy- stomach pain, nausea/vomitting  . Metoclopramide Hcl     REACTION: Lactation  . Penicillins     REACTION: yeast infection    Current Outpatient Prescriptions on File Prior to Visit  Medication Sig Dispense Refill  . albuterol (PROVENTIL HFA;VENTOLIN HFA) 108 (90 BASE) MCG/ACT inhaler Inhale 2 puffs into the lungs every 6 (six) hours as needed.       . Armodafinil (NUVIGIL) 250 MG tablet Take 250 mg by mouth. Take 1 -1 1/2 tablets by mouth every morning.       . cetirizine-pseudoephedrine (ZYRTEC-D) 5-120 MG per tablet 1 tablet.        . clonazePAM (KLONOPIN) 0.5 MG tablet Take 0.5 mg by mouth at bedtime as needed.          . desvenlafaxine (PRISTIQ) 100 MG 24 hr tablet Take 100 mg by mouth daily.        . ferrous sulfate 325 (65 FE) MG tablet Take 325 mg by mouth daily.        . fish oil-omega-3 fatty acids 1000 MG capsule Take 1 g by mouth daily.       Marland Kitchen levETIRAcetam (KEPPRA) 750 MG tablet Take 750 mg by mouth 3 (three) times daily.        . mometasone (NASONEX) 50 MCG/ACT nasal spray Place 2 sprays into the nose daily.       . Multiple Vitamins-Minerals (WOMENS MULTI VITAMIN & MINERAL) TABS Take 1 tablet by mouth daily.        Marland Kitchen omeprazole (PRILOSEC) 20 MG capsule 40 mg.       . ondansetron (ZOFRAN) 4 MG tablet Take 1 tablet (4 mg total) by mouth every 6 (six) hours as needed.  20 tablet  0  . rOPINIRole (REQUIP) 3 MG tablet Take 1 tablet (3 mg total) by mouth at bedtime.  30 tablet  1  . simvastatin (  ZOCOR) 10 MG tablet Take 10 mg by mouth at bedtime.        . traMADol (ULTRAM) 50 MG tablet Take 50 mg by mouth every 6 (six) hours as needed.        . gabapentin (NEURONTIN) 300 MG capsule Take 1 capsule (300 mg total) by mouth 3 (three) times daily.  90 capsule  2    BP 120/84  Pulse 72  Temp(Src) 98.7 F (37.1 C) (Oral)  Resp 16  Ht 5' 6.5" (1.689 m)  Wt 190 lb (86.183 kg)  BMI 30.21 kg/m2  SpO2 98%    Objective:   Physical Exam  Constitutional: She appears well-developed and well-nourished.  HENT:  Head: Normocephalic and atraumatic.  Mouth/Throat: Uvula is midline and oropharynx is clear and moist.  Cardiovascular: Normal rate and regular rhythm.   Pulmonary/Chest: Effort normal and breath sounds normal. No respiratory distress. She has no wheezes. She has no rales. She exhibits no tenderness.          Assessment & Plan:

## 2010-11-01 NOTE — Assessment & Plan Note (Signed)
Cough is unchanged.  I am still suspicious that this is related to reflux.  I recommended that she elevate the head of her bed, handout provided on reflux precautions, increase prilosec from 20 to 40mg  daily. Will refer to Dr. Vassie Loll for further recommendations. We did try to have the pt complete PFT's today, but she was unable to complete due to cough.

## 2010-11-03 ENCOUNTER — Ambulatory Visit: Payer: Self-pay | Admitting: Psychology

## 2010-11-03 LAB — URINALYSIS, ROUTINE W REFLEX MICROSCOPIC
Bilirubin Urine: NEGATIVE
Glucose, UA: NEGATIVE
Hgb urine dipstick: NEGATIVE
Ketones, ur: NEGATIVE
Nitrite: NEGATIVE
Protein, ur: NEGATIVE
Urobilinogen, UA: 0.2

## 2010-11-03 LAB — CBC
HCT: 37.9
Hemoglobin: 13.1
MCHC: 34.6
Platelets: 184
RBC: 4.2
RDW: 14
WBC: 4.9

## 2010-11-03 LAB — DIFFERENTIAL
Basophils Relative: 0
Eosinophils Absolute: 0.1
Eosinophils Relative: 1
Lymphocytes Relative: 39
Lymphs Abs: 1.3
Lymphs Abs: 1.9
Monocytes Absolute: 0.3
Monocytes Relative: 7
Neutro Abs: 2.5
Neutrophils Relative %: 52

## 2010-11-03 LAB — BASIC METABOLIC PANEL
BUN: 10
CO2: 26
Calcium: 8.8
Calcium: 9.1
Creatinine, Ser: 0.78
GFR calc Af Amer: >60
GFR calc non Af Amer: >60
GFR calc non Af Amer: >60
Glucose, Bld: 122 — ABNORMAL HIGH
Sodium: 142

## 2010-11-03 LAB — TSH: TSH: 1.343

## 2010-11-03 LAB — ETHANOL: Alcohol, Ethyl (B): <5

## 2010-11-07 LAB — URINALYSIS, ROUTINE W REFLEX MICROSCOPIC
Ketones, ur: NEGATIVE
Nitrite: NEGATIVE
pH: 8.5 — ABNORMAL HIGH

## 2010-11-07 LAB — BASIC METABOLIC PANEL
BUN: 11
Creatinine, Ser: 0.6
GFR calc non Af Amer: >60
Potassium: 3.5

## 2010-11-07 LAB — DIFFERENTIAL
Lymphocytes Relative: 16
Lymphs Abs: 1.2
Neutrophils Relative %: 80 — ABNORMAL HIGH

## 2010-11-07 LAB — CBC
HCT: 42.8
Platelets: 211
WBC: 7.7

## 2010-11-07 LAB — HEPATIC FUNCTION PANEL
ALT: 39 — ABNORMAL HIGH
AST: 31
Albumin: 4.2
Alkaline Phosphatase: 78
Total Protein: 7.3

## 2010-11-08 ENCOUNTER — Ambulatory Visit (HOSPITAL_COMMUNITY)
Admission: RE | Admit: 2010-11-08 | Discharge: 2010-11-08 | Disposition: A | Discharge status: Home / Self Care | Payer: Self-pay | Source: Ambulatory Visit | Attending: Neurology | Admitting: Neurology

## 2010-11-08 DIAGNOSIS — R569 Unspecified convulsions: Secondary | ICD-10-CM | POA: Insufficient documentation

## 2010-11-08 DIAGNOSIS — R9401 Abnormal electroencephalogram [EEG]: Secondary | ICD-10-CM | POA: Insufficient documentation

## 2010-11-08 NOTE — Procedures (Signed)
EEG NUMBER:  12 - 1108.  This routine EEG was requested in this 35 year old woman who has a history of seizures.  Purpose of this EEG is to determine if she is having interictal epileptiform discharges.  Her medications include levetiracetam.  The EEG was done with the patient awake, drowsy and asleep.  During periods of maximal wakefulness, she had 10-11 cycle per second posterior dominant rhythm that attenuated with eye opening and was symmetric. Background activities were characterized by low-amplitude alpha and beta activities that were symmetric.  Photic stimulation produced a minimal symmetric driving response.  The patient was not hyperventilated because of breathing problems.  The patient did become drowsy as characterized by attenuation of the alpha rhythm and muscle activity as well as the onset of slow roving eye movements.  Background activities also slowed with diffuse theta activities seen.  She entered stage II sleep with symmetric vertex sharp waves and sleep spindles.  A note was made of 2 bursts of atypical generalized frontally dominant polyspike and wave discharges that lasted approximately a second.  These occurred at the rate of about 3 cycles per second.  CLINICAL INTERPRETATION:  This routine EEG done with the patient awake, drowsy and asleep is abnormal.  The presence of polyspike and wave discharges are consistent with a diagnosis of a generalized epilepsy.          ______________________________ Denton Meek, MD    [id number] D:  11/08/2010 16:48:47  T:  11/08/2010 22:50:05  Job #:  [id number]

## 2010-11-11 ENCOUNTER — Emergency Department (HOSPITAL_BASED_OUTPATIENT_CLINIC_OR_DEPARTMENT_OTHER)
Admission: EM | Admit: 2010-11-11 | Discharge: 2010-11-11 | Disposition: A | Discharge status: Home / Self Care | Payer: Self-pay | Attending: Emergency Medicine | Admitting: Emergency Medicine

## 2010-11-11 ENCOUNTER — Encounter (HOSPITAL_BASED_OUTPATIENT_CLINIC_OR_DEPARTMENT_OTHER): Payer: Self-pay | Admitting: *Deleted

## 2010-11-11 DIAGNOSIS — Z79899 Other long term (current) drug therapy: Secondary | ICD-10-CM | POA: Insufficient documentation

## 2010-11-11 DIAGNOSIS — J3489 Other specified disorders of nose and nasal sinuses: Secondary | ICD-10-CM | POA: Insufficient documentation

## 2010-11-11 DIAGNOSIS — J329 Chronic sinusitis, unspecified: Secondary | ICD-10-CM | POA: Insufficient documentation

## 2010-11-11 DIAGNOSIS — E785 Hyperlipidemia, unspecified: Secondary | ICD-10-CM | POA: Insufficient documentation

## 2010-11-11 MED ORDER — AZITHROMYCIN 250 MG PO TABS: 250.0000 mg | ORAL_TABLET | Freq: Every day | ORAL | Status: AC

## 2010-11-11 NOTE — ED Notes (Signed)
Pt states that she started off with cough 5 weeks ago. Now c/o congestion.

## 2010-11-11 NOTE — ED Provider Notes (Signed)
History  Scribed for Dr. Judd Lien, the patient was seen in room [facility]. The chart was scribed by Gilman Schmidt. The patients care was started at 1812.  CSN: [id number] Arrival date & time: 11/11/2010  6:00 PM  Chief Complaint  Patient presents with  . Nasal Congestion    HPI [name] is a 35 y.o. female who presents to the Emergency Department complaining of nonproductive cough onset 1 month ago. Associated symptoms of throbbing headache, ears feeling "plugged up", and congestion. No history of allergies. No sick contact. There are no other associated symptoms and no other alleviating or aggravating factors.   PAST MEDICAL HISTORY:  Past Medical History  Diagnosis Date  . Seizures   . Narcolepsy   . Restless leg   . Depression   . Hyperlipidemia      PAST SURGICAL HISTORY:  Past Surgical History  Procedure Date  . Partial hysterectomy 2006  . Appendectomy 2006     MEDICATIONS:  Previous Medications   ALBUTEROL (PROVENTIL HFA;VENTOLIN HFA) 108 (90 BASE) MCG/ACT INHALER    Inhale 2 puffs into the lungs every 6 (six) hours as needed. For wheezing or shortness of breath   ARMODAFINIL (NUVIGIL) 250 MG TABLET    Take 250 mg by mouth every morning.    BENZONATATE (TESSALON) 100 MG CAPSULE    Take 100 mg by mouth 3 (three) times daily as needed. For cough    CETIRIZINE-PSEUDOEPHEDRINE (ZYRTEC-D) 5-120 MG PER TABLET    Take 1 tablet by mouth daily.    CETIRIZINE-PSEUDOEPHEDRINE PO    Take 1 tablet by mouth daily.     CHLORPHENIRAMINE-HYDROCODONE (TUSSIONEX) 10-8 MG/5ML LQCR    Take 5 mLs by mouth every 12 (twelve) hours as needed. For cough     CLONAZEPAM (KLONOPIN) 0.5 MG TABLET    Take 0.5 mg by mouth at bedtime as needed. For anxiety   DESVENLAFAXINE (PRISTIQ) 100 MG 24 HR TABLET    Take 100 mg by mouth daily.     FERROUS SULFATE 325 (65 FE) MG TABLET    Take 325 mg by mouth daily.     FISH OIL-OMEGA-3 FATTY ACIDS 1000 MG CAPSULE    Take 1 g by mouth daily.    FLUTICASONE-SALMETEROL  (ADVAIR HFA) 115-21 MCG/ACT INHALER    Inhale 2 puffs into the lungs 2 (two) times daily.   GABAPENTIN (NEURONTIN) 300 MG CAPSULE    Take 1 capsule (300 mg total) by mouth 3 (three) times daily.   LEVETIRACETAM (KEPPRA) 750 MG TABLET    Take 750 mg by mouth 2 (two) times daily.    MOMETASONE (NASONEX) 50 MCG/ACT NASAL SPRAY    Place 2 sprays into the nose daily.    MULTIPLE VITAMINS-MINERALS (WOMENS MULTI VITAMIN & MINERAL) TABS    Take 1 tablet by mouth daily.     OMEPRAZOLE (PRILOSEC) 20 MG CAPSULE    Take 40 mg by mouth daily.    ROPINIROLE (REQUIP) 3 MG TABLET    Take 1 tablet (3 mg total) by mouth at bedtime.   SIMVASTATIN (ZOCOR) 10 MG TABLET    Take 10 mg by mouth at bedtime.     TRAMADOL (ULTRAM) 50 MG TABLET    Take 50 mg by mouth every 6 (six) hours as needed. For pain     ALLERGIES:  Allergies as of 11/11/2010 - Review Complete 11/11/2010  Allergen Reaction Noted  . Eggs or egg-derived products Nausea And Vomiting 06/15/2009  . Metoclopramide hcl Other (See Comments)  09/28/2008  . Penicillins Itching 01/10/2009     FAMILY HISTORY:  Family History  Problem Relation Age of Onset  . Heart attack Mother   . Diabetes Father   . Heart attack Father      SOCIAL HISTORY: History  Substance Use Topics  . Smoking status: Never Smoker   . Smokeless tobacco: Never Used  . Alcohol Use: No     Review of Systems  Constitutional: Negative for fever.  HENT: Positive for ear pain and congestion. Negative for sore throat.   Respiratory: Positive for cough. Negative for shortness of breath and wheezing.   All other systems reviewed and are negative.    Allergies  Eggs or egg-derived products; Metoclopramide hcl; and Penicillins  Home Medications   Current Outpatient Rx  Name Route Sig Dispense Refill  . ALBUTEROL SULFATE HFA 108 (90 BASE) MCG/ACT IN AERS Inhalation Inhale 2 puffs into the lungs every 6 (six) hours as needed. For wheezing or shortness of breath    .  ARMODAFINIL 250 MG PO TABS Oral Take 250 mg by mouth every morning.     Marland Kitchen BENZONATATE 100 MG PO CAPS Oral Take 100 mg by mouth 3 (three) times daily as needed. For cough     . CETIRIZINE-PSEUDOEPHEDRINE PO Oral Take 1 tablet by mouth daily.      Marland Kitchen HYDROCOD POLST-CHLORPHEN POLST 10-8 MG/5ML PO LQCR Oral Take 5 mLs by mouth every 12 (twelve) hours as needed. For cough      . CLONAZEPAM 0.5 MG PO TABS Oral Take 0.5 mg by mouth at bedtime as needed. For anxiety    . DESVENLAFAXINE SUCCINATE 100 MG PO TB24 Oral Take 100 mg by mouth daily.      Marland Kitchen FERROUS SULFATE 325 (65 FE) MG PO TABS Oral Take 325 mg by mouth daily.      . OMEGA-3 FATTY ACIDS 1000 MG PO CAPS Oral Take 1 g by mouth daily.     Marland Kitchen FLUTICASONE-SALMETEROL 115-21 MCG/ACT IN AERO Inhalation Inhale 2 puffs into the lungs 2 (two) times daily. 1 Inhaler 0  . LEVETIRACETAM 750 MG PO TABS Oral Take 750 mg by mouth 2 (two) times daily.     . MOMETASONE FUROATE 50 MCG/ACT NA SUSP Nasal Place 2 sprays into the nose daily.     . WOMENS MULTI VITAMIN & MINERAL PO TABS Oral Take 1 tablet by mouth daily.      Marland Kitchen OMEPRAZOLE 20 MG PO CPDR Oral Take 40 mg by mouth daily.     Marland Kitchen ONDANSETRON HCL 4 MG PO TABS Oral Take 4 mg by mouth every 6 (six) hours as needed. For nausea or vomiting     . ROPINIROLE HCL 3 MG PO TABS Oral Take 1 tablet (3 mg total) by mouth at bedtime. 30 tablet 1  . SIMVASTATIN 10 MG PO TABS Oral Take 10 mg by mouth at bedtime.      . TRAMADOL HCL 50 MG PO TABS Oral Take 50 mg by mouth every 6 (six) hours as needed. For pain    . CETIRIZINE-PSEUDOEPHEDRINE 5-120 MG PO TB12 Oral Take 1 tablet by mouth daily.     Marland Kitchen GABAPENTIN 300 MG PO CAPS Oral Take 1 capsule (300 mg total) by mouth 3 (three) times daily. 90 capsule 2    BP 119/84  Pulse 92  Temp(Src) 98.1 F (36.7 C) (Oral)  Resp 20  Ht 5\' 6"  (1.676 m)  Wt 191 lb (86.637 kg)  BMI 30.83 kg/m2  SpO2 97%  Physical Exam  Constitutional: She is oriented to person, place, and time. She  appears well-developed and well-nourished.  Non-toxic appearance. She does not have a sickly appearance.  HENT:  Head: Normocephalic and atraumatic.  Right Ear: There is drainage. Tympanic membrane is not erythematous.  Left Ear: There is drainage. Tympanic membrane is not erythematous.  Eyes: Conjunctivae, EOM and lids are normal. Pupils are equal, round, and reactive to light. No scleral icterus.  Neck: Trachea normal and normal range of motion. Neck supple.  Cardiovascular: Normal rate, regular rhythm and normal heart sounds.   No murmur heard. Pulmonary/Chest: Effort normal and breath sounds normal.  Abdominal: Soft. Normal appearance. There is no tenderness. There is no rebound, no guarding and no CVA tenderness.  Musculoskeletal: Normal range of motion.  Neurological: She is alert and oriented to person, place, and time. She has normal strength.  Skin: Skin is warm, dry and intact. No rash noted.    ED Course  Procedures  OTHER DATA REVIEWED: Nursing notes, vital signs, and past medical records reviewed.   DIAGNOSTIC STUDIES: Oxygen Saturation is 97% on room air, normal by my interpretation.     MDM: No better with time, otc meds, will prescribe antibiotics and fu prn.  IMPRESSION: Diagnoses that have been ruled out:  Diagnoses that are still under consideration:  Final diagnoses:  Sinusitis    PLAN:  Home The patient is to return the emergency department if there is any worsening of symptoms. I have reviewed the discharge instructions with the patient.  CONDITION ON DISCHARGE: Stable  MEDICATIONS GIVEN IN THE E.D.  Medications  CETIRIZINE-PSEUDOEPHEDRINE PO (not administered)  chlorpheniramine-HYDROcodone (TUSSIONEX) 10-8 MG/5ML LQCR (not administered)  benzonatate (TESSALON) 100 MG capsule (not administered)  ondansetron (ZOFRAN) 4 MG tablet (not administered)  azithromycin (ZITHROMAX) 250 MG tablet (not administered)    DISCHARGE MEDICATIONS: New  Prescriptions   AZITHROMYCIN (ZITHROMAX) 250 MG TABLET    Take 1 tablet (250 mg total) by mouth daily. Take first 2 tablets together, then 1 every day until finished.    SCRIBE ATTESTATION: I personally performed the services described in this documentation, which was scribed in my presence. The recorded information has been reviewed and considered.      Labs Reviewed - No data to display No results found.   1. Sinusitis             Geoffery Lyons, MD 11/12/10 2038

## 2010-11-13 ENCOUNTER — Telehealth: Payer: Self-pay | Admitting: *Deleted

## 2010-11-13 NOTE — Telephone Encounter (Signed)
Should pt continue with referral to Dr Shelle Iron?

## 2010-11-13 NOTE — Telephone Encounter (Signed)
Pt.notified

## 2010-11-13 NOTE — Telephone Encounter (Signed)
I recommend that she use tylenol or motrin for ear pain.  Follow up later this week in office if symptoms worsen or do not improve.

## 2010-11-13 NOTE — Telephone Encounter (Signed)
Received message from pt that she was seen in Urgent Care over the weekend and diagnosed with sinus infection and fluid in her ears. States she has 2 more day of z-pack to complete but is having increase of ear pain and is requesting Rx of ear drops to help her pain. Also wants to know if she still needs to see Dr Shelle Iron in light of sinus infection diagnosis?  Please advise.

## 2010-11-13 NOTE — Telephone Encounter (Signed)
Yes, continue with referral to Dr. Shelle Iron.

## 2010-11-15 ENCOUNTER — Ambulatory Visit (HOSPITAL_COMMUNITY): Payer: Self-pay | Admitting: Physician Assistant

## 2010-11-16 ENCOUNTER — Ambulatory Visit: Payer: Self-pay | Admitting: Pulmonary Disease

## 2010-11-17 ENCOUNTER — Encounter: Payer: Self-pay | Admitting: Pulmonary Disease

## 2010-11-17 ENCOUNTER — Telehealth: Payer: Self-pay | Admitting: *Deleted

## 2010-11-17 ENCOUNTER — Telehealth: Payer: Self-pay | Admitting: Pulmonary Disease

## 2010-11-17 ENCOUNTER — Ambulatory Visit (INDEPENDENT_AMBULATORY_CARE_PROVIDER_SITE_OTHER): Payer: Self-pay | Admitting: Pulmonary Disease

## 2010-11-17 VITALS — BP 120/78 | HR 77 | Temp 98.2°F | Ht 66.0 in | Wt 190.2 lb

## 2010-11-17 DIAGNOSIS — G40909 Epilepsy, unspecified, not intractable, without status epilepticus: Secondary | ICD-10-CM

## 2010-11-17 DIAGNOSIS — R05 Cough: Secondary | ICD-10-CM

## 2010-11-17 MED ORDER — LEVOFLOXACIN 750 MG PO TABS: 750.0000 mg | ORAL_TABLET | Freq: Every day | ORAL | Status: AC

## 2010-11-17 MED ORDER — HYDROCOD POLST-CPM POLST ER 10-8 MG PO CP12: 1.0000 | ORAL_CAPSULE | Freq: Two times a day (BID) | ORAL | Status: DC | PRN

## 2010-11-17 MED ORDER — HYDROCOD POLST-CHLORPHEN POLST 10-8 MG/5ML PO LQCR: 5.0000 mL | Freq: Two times a day (BID) | ORAL | Status: DC | PRN

## 2010-11-17 MED ORDER — BENZONATATE 100 MG PO CAPS: 200.0000 mg | ORAL_CAPSULE | Freq: Four times a day (QID) | ORAL | Status: DC

## 2010-11-17 MED ORDER — CLONAZEPAM 1 MG PO TABS: ORAL_TABLET | ORAL | Status: DC

## 2010-11-17 NOTE — Patient Instructions (Signed)
Stop advair and albuterol Stay on omeprazole in am, and take dexilant 60mg  in evening before dinner for next 2 weeks. Will treat with levaquin 750mg  one a day for 10 days for ?persistent sinusitis. Continue with zyrtec everyday, can use saline rinses to help clean out nose. See instruction sheet for cyclical cough protocol.  The key is to continue behavioral therapies for a while even after finishing the 3 day protocol (continue hard candy during day, no throat clearing, limit voice use). followup with me in one to two weeks after finishing protocol.

## 2010-11-17 NOTE — Telephone Encounter (Signed)
OK to send #30 tabs of klonopin to her pharmacy with directions as below.  No refills.  I have no further recommendations re: her pain at this time except that she may need to look into surgery if that is what is being recommended. I will look out for the consultation report.

## 2010-11-17 NOTE — Telephone Encounter (Signed)
Received call from pt that she has tried to get refill of Klonopin from Dr Vickey Huger but has not gotten a response. Per pharmacist, last refill was 10/12/10 for Clonopin 1mg  take 1/2 tablet at bedtime and 1 additional tablet as needed. #30. Pt is requesting refill from [place]. Also pt states she was told that the NCV does not show carpal tunnel but indicates ? Impingement of a nerve in her elbow. Was told that surgery is probably the only treatment option. Pt has asked them to forward records to [place]. Pt states she continues to have pain and ibuprofen does not help.

## 2010-11-17 NOTE — Telephone Encounter (Signed)
Spoke with [facility] regarding this. She states that the rx for tussicaps and tessalon were printed and faxed. Megan refaxed rxs to pharm and I advised the pt this was done. She states nothing furtther needed.

## 2010-11-17 NOTE — Telephone Encounter (Signed)
Patient returned phone call. Best # [phone]

## 2010-11-17 NOTE — Telephone Encounter (Signed)
Pt called back again

## 2010-11-17 NOTE — Telephone Encounter (Signed)
Per KC-use the Tussicaps that were given at OV and follow cough protocol then use Tussionex cough syrup #4oz 5ccs every 12 hours prn no refills per protocol on cough sheet. I have call the RX to the pharmacy and left a detailed message on patients mobile phone about the recs from [facility].

## 2010-11-17 NOTE — Telephone Encounter (Signed)
PT CALLED BACK AGAIN- ANXIOUS TO GET THIS CALLED IN ASAP SO THAT SHE CAN CALL THE PHARMACY AND SEE HOW MUCH THIS WILL COST. [name]

## 2010-11-17 NOTE — Telephone Encounter (Signed)
Attempted to reach pt and was instructed by her husband to call her cell#. Left message on cell to return my call.

## 2010-11-17 NOTE — Telephone Encounter (Signed)
Pt notified and refill called to Casimiro Needle at [facility]. He states that Dr Vickey Huger responded that pt needed to schedule an appt with her. Attempted to notify pt and left detailed message on her cell number.

## 2010-11-17 NOTE — Progress Notes (Signed)
  Subjective:    Patient ID: [name], female    DOB: 01-19-1976, 35 y.o.   MRN: [id number]  HPI The patient is a 35 year old female who I've been asked to see for a chronic cough.  Her cough started the beginning of September spontaneously, and was not related to any upper respiratory infection symptoms.  It was dry and hacking in nature, and she noted a fullness in her throat along with constant throat clearing.  Later she developed postnasal drip and rhinorrhea, and was treated for presumed sinusitis approximately one to 2 weeks ago with Zithromax.  She is a little better, but continues to have sinus congestion, postnasal drip, purulent nasal discharge from her nose, and ear popping.  The patient states that her cough is worse with exposures to strong odors, but is unsure if prolonged conversation exacerbates.  The cough is better with hard candy or cough drops during the day.  The patient has no history of childhood asthma, and has been unable to do spirometry secondary to her cough and shortness of breath.  She has had a chest x-ray recently that was unremarkable.  She has been tried on Advair with as needed albuterol, and feels the inhalers irritate her upper airway.  She does note that her breathing is worse with respect to exercise tolerance.  She has a history of reflux disease, but denies worsening reflux symptoms.   Review of Systems  Constitutional: Positive for appetite change. Negative for fever and unexpected weight change.  HENT: Positive for ear pain, congestion, sore throat, sneezing and trouble swallowing. Negative for nosebleeds, rhinorrhea, dental problem, postnasal drip and sinus pressure.   Eyes: Negative for redness and itching.  Respiratory: Positive for cough and shortness of breath. Negative for chest tightness and wheezing.   Cardiovascular: Positive for leg swelling. Negative for palpitations.  Gastrointestinal: Negative for nausea and vomiting.  Genitourinary:  Negative for dysuria.  Musculoskeletal: Negative for joint swelling.  Skin: Negative for rash.  Neurological: Negative for headaches.  Hematological: Does not bruise/bleed easily.  Psychiatric/Behavioral: Positive for dysphoric mood. The patient is nervous/anxious.        Objective:   Physical Exam Constitutional:  Well developed, no acute distress  HENT:  Nares patent without discharge, erythematous mucosa bilat.   Oropharynx without exudate, palate and uvula are normal  Eyes:  Perrla, eomi, no scleral icterus  Neck:  No JVD, no TMG  Cardiovascular:  Normal rate, regular rhythm, no rubs or gallops.  No murmurs        Intact distal pulses  Pulmonary :  Normal breath sounds, no stridor or respiratory distress   No rales, rhonchi, or wheezing  Abdominal:  Soft, nondistended, bowel sounds present.  No tenderness noted.   Musculoskeletal:  No lower extremity edema noted.  Lymph Nodes:  No cervical lymphadenopathy noted  Skin:  No cyanosis noted  Neurologic:  Alert, appropriate, moves all 4 extremities without obvious deficit.         Assessment & Plan:

## 2010-11-17 NOTE — Assessment & Plan Note (Signed)
The patient's cough is most consistent with an upper airway etiology.  She clearly has a cyclical component to her cough with a globus sensation, has a history of reflux disease, and is having postnasal drip with other symptoms consistent with persistent sinusitis?  I think it is unlikely she has asthma, and we'll therefore discontinue her inhalers which can also destabilize her upper airway.  Will try the cyclical cough protocol, and have given her a patient information sheet for this.  Will also treat her with an antibiotic for a longer course for possible sinusitis.  I have asked her to continue with her antihistamine, and we'll intensify treatment for possible laryngopharyngeal reflux.  If she continues to have cough, we'll consider a scan of her sinuses and retry PFTs.

## 2010-11-17 NOTE — Telephone Encounter (Signed)
Spoke with pt. She states would like rx for "liquid tussi caps". She states tussicaps too expensive. Please advise thanks!

## 2010-11-17 NOTE — Telephone Encounter (Signed)
To my knowledge there is not a generic for tussicaps.

## 2010-11-23 ENCOUNTER — Telehealth: Payer: Self-pay | Admitting: Pulmonary Disease

## 2010-11-23 NOTE — Telephone Encounter (Signed)
Per CY-okay to give #10 sample Tussicaps or give Phenergan codeine cough syrup #345ml 1 tsp every 6 hours prn no refills. No more till sees Clance.

## 2010-11-23 NOTE — Telephone Encounter (Signed)
Called and spoke with [name]'s husband, Trey Paula.  Trey Paula states [name] was seen by [facility] on Friday 10/12 and was rec to start on cyclical cough protocol.  [name] states she did protocol on Sunday, Monday and Tuesday and only "coughed a few times."  However, husband states ever since finishing protocol, her cough has gotten worse and is now back to where it was when she was seen.  Husband states [name] was unable to go to school today d/t cough.  [name] is out of tussicap samples and was never able to pick up rx for Tussionex d/t not having the money to afford it.  [name] does still have a few tessalon perles left.  [name] is resting her voice as much as possible, and using hard candy (no menthol or peppermint) to bathe her throat.  [name] is requesting recs... Samples of tussicaps?   KC out of office until 10/29, will forward message to doc of the day to address.   Allergies  Allergen Reactions  . Eggs Or Egg-Derived Products Nausea And Vomiting  . Metoclopramide Hcl Other (See Comments)    Intensifies restless leg  . Penicillins Itching

## 2010-11-23 NOTE — Telephone Encounter (Signed)
Called and spoke with pt's husband.  Informed him of CY's recs.  Left samples of Tussicaps at front desk for pt to pick up.  # 10 tabs.

## 2010-11-24 ENCOUNTER — Ambulatory Visit (INDEPENDENT_AMBULATORY_CARE_PROVIDER_SITE_OTHER): Payer: Self-pay | Admitting: Family

## 2010-11-24 ENCOUNTER — Encounter: Payer: Self-pay | Admitting: Family

## 2010-11-24 VITALS — BP 110/80 | HR 78 | Temp 98.2°F | Resp 18 | Ht 66.5 in | Wt 190.1 lb

## 2010-11-24 DIAGNOSIS — M79609 Pain in unspecified limb: Secondary | ICD-10-CM

## 2010-11-24 DIAGNOSIS — E785 Hyperlipidemia, unspecified: Secondary | ICD-10-CM

## 2010-11-24 DIAGNOSIS — R569 Unspecified convulsions: Secondary | ICD-10-CM

## 2010-11-24 DIAGNOSIS — R05 Cough: Secondary | ICD-10-CM

## 2010-11-24 DIAGNOSIS — Z23 Encounter for immunization: Secondary | ICD-10-CM

## 2010-11-24 DIAGNOSIS — M79601 Pain in right arm: Secondary | ICD-10-CM

## 2010-11-24 MED ORDER — SIMVASTATIN 10 MG PO TABS: 10.0000 mg | ORAL_TABLET | Freq: Every day | ORAL | Status: DC

## 2010-11-24 NOTE — Assessment & Plan Note (Signed)
This is being managed by Dr. Modesto Charon at this time and is reportedly stable.

## 2010-11-24 NOTE — Assessment & Plan Note (Signed)
This is being managed by Dr. Shelle Iron.  I have advised her to complete the levaquin that he prescribed her for her sinus infection.

## 2010-11-24 NOTE — Progress Notes (Signed)
Subjective:    Patient ID: [name], female    DOB: 05/05/1975, 35 y.o.   MRN: [id number]  HPI  [name] is a 35 yr old female who presents today for follow up.    Pinched nerve-  Had an evaluation.  She was told that the only "fix" is surgery.  She reports that Dr. Modesto [name] gave her an rx for gabapentin.    Cough- She Dr. Shelle [name],  She did the cyclical protocol.  Did not take on Sunday, Monday, Tuesday was given tussionex.  She was OK for the first 2 days, then on the 3rd night she started coughing again. She has been using butterscotch.  She was treated with levaquin.  Notes that the nasal discharge is green.  She is using a squirt bottle saline.  She is on day number 8/10 of levaquin.    Seizure disorder- She is now seeing Dr. Modesto [name].  She is working on pt assist for [employer].  He will be managing her klonopin and Nuvigil. She denies any recent known seizure activity.     Review of Systems    see HPI  Past Medical History  Diagnosis Date  . Seizures   . Narcolepsy   . Restless leg   . Depression   . Hyperlipidemia   . Allergic rhinitis     History   Social History  . Marital Status: Married    Spouse Name: N/A    Number of Children: 2  . Years of Education: N/A   Occupational History  . Unemployed- full time student     Completed 12th grade   Social History Main Topics  . Smoking status: Never Smoker   . Smokeless tobacco: Never Used  . Alcohol Use: No  . Drug Use: No  . Sexually Active: Not on file   Other Topics Concern  . Not on file   Social History Narrative  . No narrative on file    Past Surgical History  Procedure Date  . Partial hysterectomy 2006  . Appendectomy 2006  . Tubal ligation 2002  . Appendectomy 2006    Family History  Problem Relation Age of Onset  . Heart attack Mother   . Diabetes Father   . Heart attack Father     Allergies  Allergen Reactions  . Eggs Or Egg-Derived Products Nausea And Vomiting  . Metoclopramide Hcl  Other (See Comments)    Intensifies restless leg  . Penicillins Itching    Current Outpatient Prescriptions on File Prior to Visit  Medication Sig Dispense Refill  . Armodafinil (NUVIGIL) 250 MG tablet Take 250 mg by mouth every morning.       . cetirizine-pseudoephedrine (ZYRTEC-D) 5-120 MG per tablet Take 1 tablet by mouth daily.       . clonazePAM (KLONOPIN) 1 MG tablet Take 1/2 tablet by mouth at bedtime and 1 additional tablet as needed.  30 tablet  0  . desvenlafaxine (PRISTIQ) 100 MG 24 hr tablet Take 100 mg by mouth daily.        . ferrous sulfate 325 (65 FE) MG tablet Take 325 mg by mouth daily.        . fish oil-omega-3 fatty acids 1000 MG capsule Take 1 g by mouth daily.       . Hydrocod Polst-Chlorphen Polst (TUSSICAPS) 10-8 MG CP12 Take 1 tablet by mouth 2 (two) times daily as needed.  8 each  0  . levETIRAcetam (KEPPRA) 750 MG tablet Take 750 mg  by mouth 3 (three) times daily.       Marland Kitchen levofloxacin (LEVAQUIN) 750 MG tablet Take 1 tablet (750 mg total) by mouth daily.  10 tablet  0  . Multiple Vitamins-Minerals (WOMENS MULTI VITAMIN & MINERAL) TABS Take 1 tablet by mouth daily.        Marland Kitchen omeprazole (PRILOSEC) 20 MG capsule Take 40 mg by mouth daily.       . ondansetron (ZOFRAN) 4 MG tablet Take 4 mg by mouth every 6 (six) hours as needed. For nausea or vomiting       . rOPINIRole (REQUIP) 3 MG tablet Take 1 tablet (3 mg total) by mouth at bedtime.  30 tablet  1  . traMADol (ULTRAM) 50 MG tablet Take 50 mg by mouth every 6 (six) hours as needed. For pain        BP 110/80  Pulse 78  Temp(Src) 98.2 F (36.8 C) (Oral)  Resp 18  Ht 5' 6.5" (1.689 m)  Wt 190 lb 1.9 oz (86.238 kg)  BMI 30.23 kg/m2    Objective:   Physical Exam  Constitutional: She appears well-developed and well-nourished.  HENT:  Right Ear: Tympanic membrane and ear canal normal.  Left Ear: Tympanic membrane and ear canal normal.  Mouth/Throat: Uvula is midline, oropharynx is clear and moist and mucous  membranes are normal.  Neck: Normal range of motion. Neck supple.  Cardiovascular: Normal rate and regular rhythm.   No murmur heard. Pulmonary/Chest: Effort normal and breath sounds normal. No respiratory distress. She has no wheezes. She has no rales. She exhibits no tenderness.  Psychiatric: She has a normal mood and affect. Her behavior is normal. Judgment and thought content normal.          Assessment & Plan:

## 2010-11-24 NOTE — Patient Instructions (Signed)
Call if your symptoms worsen or if they do not improve.  Follow up in 3 months.

## 2010-11-24 NOTE — Assessment & Plan Note (Signed)
She was seen at [facility].  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  Dr. Modesto Charon gave her an Rx for gabapentin which she has not started.  I recommended that she try this for pain.

## 2010-11-25 LAB — HEPATIC FUNCTION PANEL
AST: 35 U/L (ref 0–37)
Albumin: 4.3 g/dL (ref 3.5–5.2)
Alkaline Phosphatase: 81 U/L (ref 39–117)
Total Protein: 6.9 g/dL (ref 6.0–8.3)

## 2010-11-27 ENCOUNTER — Encounter: Payer: Self-pay | Admitting: Family

## 2010-11-28 ENCOUNTER — Ambulatory Visit (INDEPENDENT_AMBULATORY_CARE_PROVIDER_SITE_OTHER): Payer: Self-pay | Admitting: Physician Assistant

## 2010-11-28 DIAGNOSIS — F411 Generalized anxiety disorder: Secondary | ICD-10-CM

## 2010-12-01 ENCOUNTER — Ambulatory Visit (INDEPENDENT_AMBULATORY_CARE_PROVIDER_SITE_OTHER): Payer: Self-pay | Admitting: Psychology

## 2010-12-01 DIAGNOSIS — F331 Major depressive disorder, recurrent, moderate: Secondary | ICD-10-CM

## 2010-12-04 ENCOUNTER — Encounter: Payer: Self-pay | Admitting: *Deleted

## 2010-12-04 ENCOUNTER — Encounter: Payer: Self-pay | Admitting: Pulmonary Disease

## 2010-12-04 ENCOUNTER — Ambulatory Visit (INDEPENDENT_AMBULATORY_CARE_PROVIDER_SITE_OTHER): Payer: Self-pay | Admitting: Pulmonary Disease

## 2010-12-04 VITALS — BP 102/72 | HR 72 | Temp 97.2°F | Ht 66.0 in | Wt 194.8 lb

## 2010-12-04 DIAGNOSIS — R059 Cough, unspecified: Secondary | ICD-10-CM

## 2010-12-04 DIAGNOSIS — R05 Cough: Secondary | ICD-10-CM

## 2010-12-04 NOTE — Assessment & Plan Note (Signed)
The pt's cough is 60% better, but is still an issue.  Her spirometry today is normal off inhaled medications, but she continues to have ongoing nasal symptoms that are concerning for chronic sinusitis.  I continue to believe there is a cyclical cough mechanism as well.   Will check scan of her sinuses, and have asked her to continue with behavioral therapy to limit cyclical coughing.

## 2010-12-04 NOTE — Progress Notes (Signed)
  Subjective:    Patient ID: [name], female    DOB: 04-26-1975, 35 y.o.   MRN: [id number]  HPI The pt comes in today for f/u of her chronic cough.  The last visit, she was felt to have an upper airway cough related to acute sinusitis and a cyclical cough mechanism.  She improved with extension of abx and the cyclical cough protocol, but cough returned once finished.  She currently states her cough is a 4/10 with 10 being the worst it has ever been.  She is still having sinus pressure, sinus headache, nasal congestion, and postnasal drip.  She has stayed off her inhaler medications.    Review of Systems  Constitutional: Positive for diaphoresis. Negative for fever and unexpected weight change.  HENT: Positive for ear pain, congestion, sore throat, rhinorrhea, sneezing, trouble swallowing, postnasal drip and sinus pressure. Negative for nosebleeds and dental problem.   Eyes: Negative for redness and itching.  Respiratory: Positive for cough and shortness of breath. Negative for chest tightness and wheezing.   Cardiovascular: Positive for leg swelling. Negative for palpitations.  Gastrointestinal: Negative for nausea and vomiting.  Genitourinary: Negative for dysuria.  Musculoskeletal: Negative for joint swelling.  Skin: Negative for rash.  Neurological: Positive for headaches.  Hematological: Does not bruise/bleed easily.  Psychiatric/Behavioral: Negative for dysphoric mood. The patient is not nervous/anxious.        Objective:   Physical Exam Obese female in nad Nose without purulence or discharge, op clear Chest totally clear to auscultation Cor with rrr LE without edema, no cyanosis Alert, oriented, moves all 4        Assessment & Plan:

## 2010-12-04 NOTE — Patient Instructions (Signed)
Your spirometry is normal, so ok to stay off your inhalers Will schedule for scan of your sinuses, and will call you with results Continue with your voice rest, no throat clearing, using hard candy.

## 2010-12-05 ENCOUNTER — Other Ambulatory Visit: Payer: Self-pay | Admitting: Pulmonary Disease

## 2010-12-05 ENCOUNTER — Ambulatory Visit (INDEPENDENT_AMBULATORY_CARE_PROVIDER_SITE_OTHER)
Admission: RE | Admit: 2010-12-05 | Discharge: 2010-12-05 | Disposition: A | Discharge status: Home / Self Care | Payer: Self-pay | Source: Ambulatory Visit | Attending: Pulmonary Disease | Admitting: Pulmonary Disease

## 2010-12-05 DIAGNOSIS — R05 Cough: Secondary | ICD-10-CM

## 2010-12-06 ENCOUNTER — Other Ambulatory Visit: Payer: Self-pay | Admitting: *Deleted

## 2010-12-06 MED ORDER — CLINDAMYCIN HCL 300 MG PO CAPS: 300.0000 mg | ORAL_CAPSULE | Freq: Four times a day (QID) | ORAL | Status: AC

## 2010-12-07 ENCOUNTER — Telehealth: Payer: Self-pay | Admitting: Pulmonary Disease

## 2010-12-07 NOTE — Telephone Encounter (Signed)
The patient is requesting a note for school stating that she had to miss class from 10/29 through 11/1 due to illness. She said that she spoke with [facility] yesterday and told her that she had been dizzy upon standing and that her sinuses were no better and we are working on getting her in with ENT. Marland Kitchen Pls advise on mailing note to pt's home.

## 2010-12-08 NOTE — Telephone Encounter (Signed)
Spoke with pt and notified that the note that she requested was mailed to her. Pt verbalized understanding and states nothing further needed.

## 2010-12-08 NOTE — Telephone Encounter (Signed)
Note given to triage

## 2010-12-13 ENCOUNTER — Telehealth: Payer: Self-pay | Admitting: Pulmonary Disease

## 2010-12-13 NOTE — Telephone Encounter (Signed)
lmomtcb  

## 2010-12-14 ENCOUNTER — Encounter: Payer: Self-pay | Admitting: Family

## 2010-12-14 NOTE — Telephone Encounter (Signed)
LMOMTCB

## 2010-12-15 ENCOUNTER — Ambulatory Visit (INDEPENDENT_AMBULATORY_CARE_PROVIDER_SITE_OTHER): Payer: Self-pay | Admitting: Psychology

## 2010-12-15 DIAGNOSIS — F331 Major depressive disorder, recurrent, moderate: Secondary | ICD-10-CM

## 2010-12-15 NOTE — Telephone Encounter (Signed)
lmomtcb  

## 2010-12-18 ENCOUNTER — Other Ambulatory Visit: Payer: Self-pay | Admitting: Family

## 2010-12-18 ENCOUNTER — Ambulatory Visit (HOSPITAL_COMMUNITY): Payer: Self-pay | Admitting: Physician Assistant

## 2010-12-18 ENCOUNTER — Ambulatory Visit (INDEPENDENT_AMBULATORY_CARE_PROVIDER_SITE_OTHER): Payer: Self-pay | Admitting: Neurology

## 2010-12-18 ENCOUNTER — Telehealth: Payer: Self-pay | Admitting: Pulmonary Disease

## 2010-12-18 ENCOUNTER — Encounter: Payer: Self-pay | Admitting: Neurology

## 2010-12-18 VITALS — BP 118/76 | HR 80 | Ht 66.0 in | Wt 194.0 lb

## 2010-12-18 DIAGNOSIS — G40909 Epilepsy, unspecified, not intractable, without status epilepticus: Secondary | ICD-10-CM

## 2010-12-18 DIAGNOSIS — G40309 Generalized idiopathic epilepsy and epileptic syndromes, not intractable, without status epilepticus: Secondary | ICD-10-CM

## 2010-12-18 MED ORDER — CLONAZEPAM 1 MG PO TABS: ORAL_TABLET | ORAL | Status: DC

## 2010-12-18 NOTE — Telephone Encounter (Signed)
lmtcb

## 2010-12-18 NOTE — Telephone Encounter (Signed)
Addended by: Mervin Kung A on: 12/18/2010 05:04 PM   Modules accepted: Orders

## 2010-12-18 NOTE — Telephone Encounter (Signed)
Received call from pharm

## 2010-12-18 NOTE — Telephone Encounter (Signed)
Received call from Luise at pharmacy stating pt told them she is not seeing Dr Kandyce Rud now. Spoke with [employer] and we will resume management of Clonazepam until further notice. Verbal given to Luise for 1mg  #30 x no refills.

## 2010-12-18 NOTE — Progress Notes (Signed)
Dear 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  As you may recall, she is a 35 y.o. year old female with a history of seizures since 2009 as well as narcolepsy diagnosed with a MSLT done by Dr. Porfirio Mylar Dohmeier.  At the time I saw her last visit her biggest concern was her right arm pain.   I felt that this was either a median neuropathy, ulnar neuropathy or radiculopathy.  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  She is apparently going to see them back re management in January.  She is not getting any relief from the Neurontin I prescribed her and is taking 300mg  tid.  She does not get relief from tramadol and ibuprofen "tear's her stomach up."  The pain is worse when she rests her elbow.  She has not attempted to pad it.  She says that she is unsure whether she has had any seizures and has not had any during the day.  She has been off her Keppra XR 750mg  tid for 1 month due to cost.    I got an EEG which revealed generalized polyspike and wave discharges.  Her narcolepsy is stable and continues on Nuvigil 250mg  daily.  She also take ropinirole 3mg  qhs for restless legs.    Medical history, social history, family history, medications and allergies were reviewed and have not changed since the last clinic vist.  ROS:  13 systems were reviewed and are notable for headaches, arm swelling, gastritis.  All other review of systems are unremarkable.  Exam: . Filed Vitals:   12/18/10 1439  BP: 118/76  Pulse: 80  Height: 5\' 6"  (1.676 m)  Weight: 194 lb (87.998 kg)    In general, well appearing young woman in NAD.  Motor:  Normal bulk and tone, no drift and 5/5 muscle strength bilaterally.  Positive phalen's sign at the right elbow.  No obvious focal weakness of the hand.  Reflexes:  2+ thoughout in the UE, except 1+ right  triceps.     Impression/Recs: 1.  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  I am not optimistic this will help.  I have emphasized using a pad to pad her elbow, in particular a sports elbow pad may work.  She is to follow up with the [facility] folks about further management.   2.  Idiopathic generalized epilepsy with nocturnal seizures - I have put her back on Keppra XR 750 tabs take 4 qhs.  We have filled out her paperwork for patient assistance so she can start taking it again. 3.  Possible narcolepsy - continue Nuvigil as before. 4.  RLS - continue ropinirole 3mg  qhs.  We will see the patient back in 3 months.  Lupita Raider Modesto Charon, MD [facility], [place]

## 2010-12-18 NOTE — Telephone Encounter (Signed)
LMOMTCB and will close encounter since this is the 4th attempt.

## 2010-12-18 NOTE — Telephone Encounter (Signed)
Spoke with pt. She is requesting to speak with [facility] about her ENT referral. She wants to be referred to [facility] if this is possible. Please advise, thanks!

## 2010-12-18 NOTE — Telephone Encounter (Signed)
Refill called to Luise at [facility] for requip #30 x no refill. Advised him that Clonazepam refills should come from Dr Vickey Huger per 11/17/10 phone note, request denied.

## 2010-12-18 NOTE — Telephone Encounter (Signed)
Returning call can be reached at [phone].[name]

## 2010-12-18 NOTE — Telephone Encounter (Signed)
lmomtcb x1 

## 2010-12-18 NOTE — Telephone Encounter (Signed)
Pt returning Libby's call & can be reached at [phone].  [name]

## 2010-12-19 NOTE — Telephone Encounter (Signed)
Pt has appt to see dr Yehuda Mao [email or web address] 01/02/11@2 :15pm pt is aware

## 2010-12-25 ENCOUNTER — Ambulatory Visit (INDEPENDENT_AMBULATORY_CARE_PROVIDER_SITE_OTHER): Payer: Self-pay | Admitting: Psychology

## 2010-12-25 ENCOUNTER — Encounter (HOSPITAL_COMMUNITY): Payer: Self-pay | Admitting: Physician Assistant

## 2010-12-25 ENCOUNTER — Ambulatory Visit (INDEPENDENT_AMBULATORY_CARE_PROVIDER_SITE_OTHER): Payer: Self-pay | Admitting: Physician Assistant

## 2010-12-25 VITALS — BP 111/79 | HR 85 | Temp 97.0°F | Ht 66.0 in

## 2010-12-25 DIAGNOSIS — F329 Major depressive disorder, single episode, unspecified: Secondary | ICD-10-CM

## 2010-12-25 DIAGNOSIS — F331 Major depressive disorder, recurrent, moderate: Secondary | ICD-10-CM

## 2010-12-25 DIAGNOSIS — F988 Other specified behavioral and emotional disorders with onset usually occurring in childhood and adolescence: Secondary | ICD-10-CM

## 2010-12-25 DIAGNOSIS — F802 Mixed receptive-expressive language disorder: Secondary | ICD-10-CM

## 2010-12-25 DIAGNOSIS — H9325 Central auditory processing disorder: Secondary | ICD-10-CM

## 2010-12-25 NOTE — Patient Instructions (Signed)
Patient and I reviewed her instructions from her Audiologist and her Neurologist.  Stimulant medication is not an option due to her seizure disorder.  She has been provided with multiple suggestions from her Audiologist to help her cope with her focus and attention.  She is given a copy of that information for her use at home.  She is also made aware that stimulants are not in her best interest at this time and she will continue to follow up with her neurologist regarding this.

## 2010-12-25 NOTE — Progress Notes (Signed)
[name] [id number] 35 y.o.  12/25/2010 4:02 PM  Chief Complaint: follow up on record request  History of Present Illness: Suicidal Ideation: No Plan Formed: No Patient has means to carry out plan: No  Homicidal Ideation: No Plan Formed: No Patient has means to carry out plan: No  Review of Systems: Psychiatric: Agitation: No Hallucination: No Depressed Mood: No Insomnia: No Hypersomnia: better with Nuvigile Altered Concentration: Yes Feels Worthless: No Grandiose Ideas: No Belief In Special Powers: No New/Increased Substance Abuse: No Compulsions: No  Neurologic: Headache: No Seizure: Yes but none in last 6 months Paresthesias: No  Past Medical Family, Social History:   Outpatient Encounter Prescriptions as of 12/25/2010  Medication Sig Dispense Refill  . Armodafinil (NUVIGIL) 250 MG tablet Take 250 mg by mouth every morning.       . cetirizine-pseudoephedrine (ZYRTEC-D) 5-120 MG per tablet Take 1 tablet by mouth daily.       . clonazePAM (KLONOPIN) 1 MG tablet Take 1/2 tablet by mouth at bedtime and 1 additional tablet as needed.  30 tablet  0  . desvenlafaxine (PRISTIQ) 100 MG 24 hr tablet Take 100 mg by mouth daily.        Marland Kitchen Dexlansoprazole (DEXILANT) 30 MG capsule Take 30 mg by mouth at bedtime.        . ferrous sulfate 325 (65 FE) MG tablet Take 325 mg by mouth daily.        . fish oil-omega-3 fatty acids 1000 MG capsule Take 1 g by mouth daily.       Marland Kitchen gabapentin (NEURONTIN) 300 MG capsule Take 300 mg by mouth 4 (four) times daily.        Marland Kitchen levETIRAcetam (KEPPRA) 750 MG tablet Take 750 mg by mouth 4 (four) times daily.       . Multiple Vitamins-Minerals (WOMENS MULTI VITAMIN & MINERAL) TABS Take 1 tablet by mouth daily.        Marland Kitchen omeprazole (PRILOSEC) 20 MG capsule Take 40 mg by mouth daily.       . simvastatin (ZOCOR) 10 MG tablet Take 1 tablet (10 mg total) by mouth at bedtime.  30 tablet  5  . Hydrocod  Polst-Chlorphen Polst (TUSSICAPS) 10-8 MG CP12 Take 1 tablet by mouth 2 (two) times daily as needed.  8 each  0  . ondansetron (ZOFRAN) 4 MG tablet Take 4 mg by mouth every 6 (six) hours as needed. For nausea or vomiting       . REQUIP 3 MG tablet TAKE ONE TABLET BY MOUTH AT BEDTIME  30 each  0    Past Psychiatric History/Hospitalization(s): Anxiety: N/A Bipolar Disorder: No Depression: No Mania: No Psychosis: No Schizophrenia: No Personality Disorder: No Hospitalization for psychiatric illness: No History of Electroconvulsive Shock Therapy: No Prior Suicide Attempts: No  Physical Exam: Constitutional:  There were no vitals taken for this visit.  General Appearance: alert, oriented, no acute distress  Musculoskeletal: Strength & Muscle Tone: within normal limits Gait & Station: normal Patient leans: N/A  Psychiatric: Speech (describe rate, volume, coherence, spontaneity, and abnormalities if any): Normal  Thought Process (describe rate, content, abstract reasoning, and computation): normal  Associations: Coherent  Thoughts: normal  Mental Status: Orientation: oriented to person, place, time/date, situation and day of week Mood & Affect: normal affect Attention Span & Concentration: normal while on [employer] Decision Making (Choose Three): Established Problem, Stable/Improving (1)  Assessment: Axis I: ADHD, Narcolepsy, Central Auditory  processing disorder  Axis II:   Axis III: Seizure disorder  Axis IV: burden of multiple medical problems  Axis V: GAF: 65   Plan: I have reviewed the patient's records with her from her Neurologist, Audiologist, and PCP. While she does have elements of ADHD, due to her seizure disorder it is unwise to treat her with stimulant medication per her neurologist. She is given a copy of the Audiologists recommendations to help her cope with her focus and attention. She can follow up here at anytime.  Emily Schirmer,  PA 12/25/2010

## 2011-01-03 ENCOUNTER — Other Ambulatory Visit: Payer: Self-pay

## 2011-01-03 MED ORDER — LEVETIRACETAM 750 MG PO TABS: 1500.0000 mg | ORAL_TABLET | Freq: Two times a day (BID) | ORAL | Status: DC

## 2011-01-03 NOTE — Telephone Encounter (Signed)
rx printed and faxed for pt assistance.

## 2011-01-03 NOTE — Telephone Encounter (Signed)
You can give her 1 month supply with 11 refills.

## 2011-01-09 ENCOUNTER — Other Ambulatory Visit: Payer: Self-pay | Admitting: Family

## 2011-01-09 ENCOUNTER — Ambulatory Visit (INDEPENDENT_AMBULATORY_CARE_PROVIDER_SITE_OTHER): Payer: Self-pay | Admitting: Psychology

## 2011-01-09 DIAGNOSIS — F331 Major depressive disorder, recurrent, moderate: Secondary | ICD-10-CM

## 2011-01-09 NOTE — Telephone Encounter (Signed)
Please advise refills 

## 2011-01-10 NOTE — Telephone Encounter (Signed)
OK to send #30 tabs of clonazepam with no refills. I took care of the other 2 meds. Thanks.

## 2011-01-11 NOTE — Telephone Encounter (Signed)
Clonazepam #30 x no refills called to [place] at [facility].

## 2011-01-22 ENCOUNTER — Ambulatory Visit: Payer: Self-pay | Admitting: Psychology

## 2011-02-02 ENCOUNTER — Ambulatory Visit (INDEPENDENT_AMBULATORY_CARE_PROVIDER_SITE_OTHER): Payer: Self-pay | Admitting: Psychology

## 2011-02-02 DIAGNOSIS — F331 Major depressive disorder, recurrent, moderate: Secondary | ICD-10-CM

## 2011-02-07 ENCOUNTER — Other Ambulatory Visit: Payer: Self-pay | Admitting: Family

## 2011-02-07 NOTE — Telephone Encounter (Signed)
Refill called to [place] at [facility] for clonazepam 1mg  #30 x no refills. Pt is due for follow up with [name] this month. Please call pt and arrange appt.

## 2011-02-08 ENCOUNTER — Other Ambulatory Visit: Payer: Self-pay

## 2011-02-08 ENCOUNTER — Telehealth: Payer: Self-pay | Admitting: Neurology

## 2011-02-08 MED ORDER — LEVETIRACETAM 750 MG PO TABS: 1500.0000 mg | ORAL_TABLET | Freq: Two times a day (BID) | ORAL | Status: DC

## 2011-02-08 NOTE — Telephone Encounter (Signed)
Pt got letter from her pharmacy requesting that we fax them a copy of the Kepra rx. After they receive fax, they will send her medication. Their fax # is [phone].

## 2011-02-08 NOTE — Telephone Encounter (Signed)
rx faxed to pharmacy

## 2011-02-08 NOTE — Progress Notes (Signed)
rx faxed to pt's pharmacy at her request

## 2011-02-09 ENCOUNTER — Ambulatory Visit: Payer: Self-pay | Admitting: Psychology

## 2011-02-12 ENCOUNTER — Telehealth: Payer: Self-pay | Admitting: *Deleted

## 2011-02-12 ENCOUNTER — Encounter: Payer: Self-pay | Admitting: Neurology

## 2011-02-12 NOTE — Telephone Encounter (Signed)
Received call from pt stating she has gotten a couple of high blood sugar readings recently. Fasting BS have been 259, 197 and was at 97 this morning. Advised pt she should be seen in the office and scheduled appt for 02/14/11 at 9:30am.

## 2011-02-14 ENCOUNTER — Ambulatory Visit (INDEPENDENT_AMBULATORY_CARE_PROVIDER_SITE_OTHER): Payer: Self-pay | Admitting: Family

## 2011-02-14 ENCOUNTER — Encounter: Payer: Self-pay | Admitting: Family

## 2011-02-14 DIAGNOSIS — R739 Hyperglycemia, unspecified: Secondary | ICD-10-CM

## 2011-02-14 DIAGNOSIS — F329 Major depressive disorder, single episode, unspecified: Secondary | ICD-10-CM

## 2011-02-14 DIAGNOSIS — E785 Hyperlipidemia, unspecified: Secondary | ICD-10-CM

## 2011-02-14 DIAGNOSIS — R569 Unspecified convulsions: Secondary | ICD-10-CM

## 2011-02-14 DIAGNOSIS — R7309 Other abnormal glucose: Secondary | ICD-10-CM

## 2011-02-14 LAB — LIPID PANEL
HDL: 38 mg/dL — ABNORMAL LOW (ref >39)
LDL Cholesterol: 161 mg/dL — ABNORMAL HIGH (ref 0–99)
Total CHOL/HDL Ratio: 5.9 Ratio

## 2011-02-14 LAB — BASIC METABOLIC PANEL
BUN: 16 mg/dL (ref 6–23)
CO2: 26 mEq/L (ref 19–32)
Chloride: 103 mEq/L (ref 96–112)
Glucose, Bld: 107 mg/dL — ABNORMAL HIGH (ref 70–99)
Potassium: 4.5 mEq/L (ref 3.5–5.3)

## 2011-02-14 NOTE — Assessment & Plan Note (Signed)
Fair control, management per psych/behavioral health.

## 2011-02-14 NOTE — Patient Instructions (Signed)
Please complete your lab work prior to leaving. Follow up in 3 months, sooner if problems/concerns.   

## 2011-02-14 NOTE — Assessment & Plan Note (Signed)
This is being managed by Dr. Modesto Charon.

## 2011-02-14 NOTE — Assessment & Plan Note (Signed)
Deteriorated.  Will obtain A1C.  Further management to be based on this result.

## 2011-02-14 NOTE — Assessment & Plan Note (Signed)
Obtain flp, if elevated, resume statin/fish oil.

## 2011-02-14 NOTE — Progress Notes (Signed)
Subjective:    Patient ID: [name], female    DOB: 1975/10/13, 36 y.o.   MRN: [id number]  HPI  [name] is a 36 yr old female who presents today to discuss hyperglycemia.  Hyperglycemia- She notes + polydipsia, polyuria and increased hunger since Friday.  Saturday she checked her sugar with her husband's meter and it was 259.  She reports that that her fasting sugar this AM was 122.   She reports that her Dad had diabetes and was diagnosed in his 13's.    Seizure Disorder- She reports that she has been out of her Keppra since September.  She called drug company and they were waiting on rx from Dr. Modesto Charon.  She tells me that to her knowledge this has been taken care of.  GERD- reports that this is exacerbated by food choices such as pizza.  She continue omeprazole.   Depression- fair control. She is on Pristiq. This is being managed by psychiatry/behavioral health.  Hyperlipidemia- she is not taking simvastatin or fish oil.   Review of Systems See HPI  Past Medical History  Diagnosis Date  . Seizures   . Narcolepsy   . Restless leg   . Depression   . Hyperlipidemia   . Allergic rhinitis   . Narcolepsy     History   Social History  . Marital Status: Married    Spouse Name: N/A    Number of Children: 2  . Years of Education: N/A   Occupational History  . Unemployed- full time student     Completed 12th grade   Social History Main Topics  . Smoking status: Never Smoker   . Smokeless tobacco: Never Used  . Alcohol Use: Not on file  . Drug Use: No  . Sexually Active: Not on file   Other Topics Concern  . Not on file   Social History Narrative  . No narrative on file    Past Surgical History  Procedure Date  . Partial hysterectomy 2006  . Appendectomy 2006  . Tubal ligation 2002  . Appendectomy 2006    Family History  Problem Relation Age of Onset  . Heart attack Mother   . Diabetes Father   . Heart attack Father     Allergies  Allergen  Reactions  . Eggs Or Egg-Derived Products Nausea And Vomiting  . Metoclopramide Hcl Other (See Comments)    Intensifies restless leg  . Penicillins Itching    Current Outpatient Prescriptions on File Prior to Visit  Medication Sig Dispense Refill  . Armodafinil (NUVIGIL) 250 MG tablet Take 250 mg by mouth every morning.       . cetirizine-pseudoephedrine (ZYRTEC-D) 5-120 MG per tablet Take 1 tablet by mouth daily.       . clonazePAM (KLONOPIN) 1 MG tablet TAKE 1/2 TABLET BY MOUTH AT BEDTIME. MAY TAKE AN EXTRA 1/2 TABLET AS NEEDED  30 tablet  0  . desvenlafaxine (PRISTIQ) 100 MG 24 hr tablet Take 100 mg by mouth daily.        Marland Kitchen gabapentin (NEURONTIN) 300 MG capsule Take 300 mg by mouth 4 (four) times daily.        Marland Kitchen levETIRAcetam (KEPPRA) 750 MG tablet Take 2 tablets (1,500 mg total) by mouth 2 (two) times daily.  120 tablet  11  . omeprazole (PRILOSEC) 20 MG capsule Take 40 mg by mouth daily.       . ondansetron (ZOFRAN) 4 MG tablet Take 4 mg by mouth every  6 (six) hours as needed. For nausea or vomiting       . REQUIP 3 MG tablet TAKE ONE TABLET BY MOUTH AT BEDTIME  30 each  2  . simvastatin (ZOCOR) 10 MG tablet Take 1 tablet (10 mg total) by mouth at bedtime.  30 tablet  5    BP 114/84  Pulse 78  Temp(Src) 97.8 F (36.6 C) (Oral)  Resp 16  Wt 194 lb 1.3 oz (88.034 kg)       Objective:   Physical Exam  Constitutional: She appears well-developed and well-nourished. No distress.  Cardiovascular: Normal rate and regular rhythm.   No murmur heard. Pulmonary/Chest: Effort normal and breath sounds normal. No respiratory distress. She has no wheezes. She has no rales. She exhibits no tenderness.  Musculoskeletal: She exhibits no edema.  Psychiatric: She has a normal mood and affect. Her behavior is normal. Judgment and thought content normal.          Assessment & Plan:

## 2011-02-15 ENCOUNTER — Telehealth: Payer: Self-pay | Admitting: Family

## 2011-02-15 MED ORDER — SIMVASTATIN 40 MG PO TABS: 40.0000 mg | ORAL_TABLET | Freq: Every day | ORAL | Status: DC

## 2011-02-15 NOTE — Telephone Encounter (Signed)
Pls call pt and let her know that her A1C is good.  5.5.  I would like her to continue to check her sugar several times a week and let us know if her sugar is >200.  We will plan to repeat her A1C in 3 months to make sure it is not going up.  Cholesterol is high.  I would like to  Increase her simvastatin to 40mg  once daily.

## 2011-02-16 NOTE — Telephone Encounter (Signed)
Pt notified and states that she had been off of the simvastatin for 1-2 months as she was afraid of possible side effects. Does pt still need to increase her dose? Also, pt is requesting help getting Keppra from pt assistance. States she has been out of med since September. Spoke to [employer] with Dr Nash Dimmer office and she states they faxed an rx to [facility] pt assistance last week. Called [facility] # [phone] and verified that pt has been approved and med has shipped to their warehouse and should be received by the office around Tuesday next week. Please advise re: simvastatin?

## 2011-02-16 NOTE — Telephone Encounter (Signed)
She can resume simvastatin 10mg  and we will repeat blood work in 3 months.

## 2011-02-16 NOTE — Telephone Encounter (Signed)
Notified pt. Advised her to call [facility] if Dr Nash Dimmer office does not receive her samples by next week. Pt voices understanding.

## 2011-02-16 NOTE — Telephone Encounter (Signed)
Left message on cell voicemail to return my call 

## 2011-02-19 ENCOUNTER — Ambulatory Visit: Payer: Self-pay | Admitting: Psychology

## 2011-03-13 ENCOUNTER — Other Ambulatory Visit: Payer: Self-pay | Admitting: Family

## 2011-03-13 NOTE — Telephone Encounter (Signed)
Refill called to [place] at [facility]. #30 x no refills.

## 2011-04-20 ENCOUNTER — Ambulatory Visit (INDEPENDENT_AMBULATORY_CARE_PROVIDER_SITE_OTHER): Payer: Self-pay | Admitting: Family

## 2011-04-20 ENCOUNTER — Ambulatory Visit: Payer: Self-pay | Admitting: Family

## 2011-04-20 ENCOUNTER — Encounter: Payer: Self-pay | Admitting: Family

## 2011-04-20 VITALS — BP 126/88 | HR 99 | Temp 98.2°F | Resp 16 | Wt 201.0 lb

## 2011-04-20 DIAGNOSIS — R569 Unspecified convulsions: Secondary | ICD-10-CM

## 2011-04-20 DIAGNOSIS — F329 Major depressive disorder, single episode, unspecified: Secondary | ICD-10-CM

## 2011-04-20 MED ORDER — DULOXETINE HCL 60 MG PO CPEP: 60.0000 mg | ORAL_CAPSULE | Freq: Every day | ORAL | Status: DC

## 2011-04-20 NOTE — Assessment & Plan Note (Signed)
Deteriorated. Will start cymbalta 30mg  once daily for 1 week, then increase to 60mg  once daily.  Samples provided today. Patient assistance for was also filled today.  Pt will attach necessary documents and then mail in forms.  We discussed common side effects of this medication and pt was advised to go to the ED if she develops suicidal ideation.

## 2011-04-20 NOTE — Patient Instructions (Signed)
Please start cymbalta 30mg  once daily for 1 week, then increase to 60mg  once daily on week two.  Follow up in 1 month. Go to the ERimmediately if you develop suicidal thoughts.

## 2011-04-20 NOTE — Progress Notes (Signed)
Subjective:    Patient ID: [name], female    DOB: 12/27/1975, 36 y.o.   MRN: [id number]  HPI  [name] is a 36 yr old female who presents today to discuss depression.   Depression- She has to go to [facility] in [place].  She reports that she has been feeling bad for a couple of months.  Doesn't want to get out of bed,  House is a "wreck." Has had hopelessness, but denies suicide ideation.  "I have my children to live for." She reports financial stress, having trouble finding finding a job.  Has sent out 200 resumes without any call backs.  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  She is very disappointed about this.  Ran out of pristique 2 days ago, but reports that even prior to running out, "it wasn't doing anything anymore."  She has been on prozac, and pristique in the past.    Seizures- she reports seizures have been well controlled, "may have had a couple (seizures) in my sleep." She follows with Dr. Modesto Charon.  Review of Systems    see HPI  Past Medical History  Diagnosis Date  . Seizures   . Narcolepsy   . Restless leg   . Depression   . Hyperlipidemia   . Allergic rhinitis   . Narcolepsy     History   Social History  . Marital Status: Married    Spouse Name: N/A    Number of Children: 2  . Years of Education: N/A   Occupational History  . Unemployed- full time student     Completed 12th grade   Social History Main Topics  . Smoking status: Never Smoker   . Smokeless tobacco: Never Used  . Alcohol Use: Not on file  . Drug Use: No  . Sexually Active: Not on file   Other Topics Concern  . Not on file   Social History Narrative  . No narrative on file    Past Surgical History  Procedure Date  . Partial hysterectomy 2006  . Appendectomy 2006  . Tubal ligation 2002  . Appendectomy 2006    Family History  Problem Relation Age of Onset  . Heart attack  Mother   . Diabetes Father   . Heart attack Father     Allergies  Allergen Reactions  . Eggs Or Egg-Derived Products Nausea And Vomiting  . Metoclopramide Hcl Other (See Comments)    Intensifies restless leg  . Penicillins Itching    Current Outpatient Prescriptions on File Prior to Visit  Medication Sig Dispense Refill  . Armodafinil (NUVIGIL) 250 MG tablet Take 250 mg by mouth every morning.       . cetirizine-pseudoephedrine (ZYRTEC-D) 5-120 MG per tablet Take 1 tablet by mouth daily.       . clonazePAM (KLONOPIN) 1 MG tablet TAKE 1/2 TABLET BY MOUTH AT BEDTIME. MAY TAKE AN EXTRA 1/2 TABLET AS NEEDED  30 tablet  0  . gabapentin (NEURONTIN) 300 MG capsule Take 300 mg by mouth 4 (four) times daily.        Marland Kitchen omeprazole (PRILOSEC) 20 MG capsule Take 40 mg by mouth daily.       . ondansetron (ZOFRAN) 4 MG tablet Take 4 mg by mouth every 6 (six) hours as needed. For nausea or vomiting       . REQUIP 3 MG tablet TAKE ONE  TABLET BY MOUTH AT BEDTIME  30 each  2  . simvastatin (ZOCOR) 10 MG tablet Take 10 mg by mouth at bedtime.        BP 126/88  Pulse 99  Temp(Src) 98.2 F (36.8 C) (Oral)  Resp 16  Wt 201 lb (91.173 kg)  SpO2 99%    Objective:   Physical Exam  Constitutional: She appears well-developed and well-nourished. No distress.  Psychiatric: Her speech is normal and behavior is normal. Judgment and thought content normal. Her affect is labile. Cognition and memory are normal. She exhibits a depressed mood.       Very tearful today.          Assessment & Plan:  25 minutes spent with this patient.  >50% of this time was spent counseling pt on her depression.

## 2011-04-20 NOTE — Assessment & Plan Note (Signed)
Stable on Keppra.  This is being managed by Dr. Modesto Charon.

## 2011-04-22 ENCOUNTER — Encounter: Payer: Self-pay | Admitting: Family

## 2011-04-23 ENCOUNTER — Other Ambulatory Visit: Payer: Self-pay | Admitting: Family

## 2011-04-23 MED ORDER — B-12 1000 MCG PO TBCR: 1.0000 | EXTENDED_RELEASE_TABLET | Freq: Every day | ORAL | Status: DC

## 2011-04-23 MED ORDER — CLONAZEPAM 1 MG PO TABS: 1.0000 mg | ORAL_TABLET | Freq: Every evening | ORAL | Status: DC | PRN

## 2011-04-23 MED ORDER — NORTRIPTYLINE HCL 50 MG PO CAPS: 50.0000 mg | ORAL_CAPSULE | Freq: Every day | ORAL | Status: DC

## 2011-04-23 MED ORDER — LEVETIRACETAM 750 MG PO TABS: 750.0000 mg | ORAL_TABLET | Freq: Two times a day (BID) | ORAL | Status: DC

## 2011-04-23 MED ORDER — LEVETIRACETAM 750 MG PO TABS: 1500.0000 mg | ORAL_TABLET | Freq: Two times a day (BID) | ORAL | Status: DC

## 2011-04-24 ENCOUNTER — Telehealth: Payer: Self-pay | Admitting: *Deleted

## 2011-04-24 NOTE — Telephone Encounter (Signed)
Pt left voice message that Cymbalta makes her sleepy. Wants to know the best time for her to take this medication? Please advise.

## 2011-04-25 NOTE — Telephone Encounter (Signed)
She should try taking at bedtime.

## 2011-04-25 NOTE — Telephone Encounter (Signed)
Notified pt. 

## 2011-04-27 ENCOUNTER — Other Ambulatory Visit: Payer: Self-pay | Admitting: Family

## 2011-04-27 NOTE — Telephone Encounter (Signed)
Please advise re: directions and quantity for Clonazepam refill request.

## 2011-04-27 NOTE — Telephone Encounter (Signed)
Clonazepam should read 1mg  at bedtime as needed #30 no refills.

## 2011-04-27 NOTE — Telephone Encounter (Signed)
Refill called to [facility] at [facility].

## 2011-05-02 ENCOUNTER — Ambulatory Visit (INDEPENDENT_AMBULATORY_CARE_PROVIDER_SITE_OTHER): Payer: Self-pay | Admitting: Family

## 2011-05-02 ENCOUNTER — Encounter: Payer: Self-pay | Admitting: Family

## 2011-05-02 VITALS — BP 110/76 | HR 68 | Temp 98.0°F | Resp 20

## 2011-05-02 DIAGNOSIS — J029 Acute pharyngitis, unspecified: Secondary | ICD-10-CM

## 2011-05-02 NOTE — Assessment & Plan Note (Signed)
Rapid strep neg.  Recommended motrin and zyrtec D, call if sore throat of sinus congestion worsen or if no improvement in 2-3 days.

## 2011-05-02 NOTE — Patient Instructions (Signed)
Viral Pharyngitis Viral pharyngitis is a viral infection that produces redness, pain, and swelling (inflammation) of the throat. It can spread from person to person (contagious).  CAUSES Viral pharyngitis is caused by inhaling a large amount of certain germs called viruses. Many different viruses cause viral pharyngitis. SYMPTOMS Symptoms of viral pharyngitis include:  Sore throat.   Tiredness.   Stuffy nose.   Low-grade fever.   Congestion.   Cough.  TREATMENT Treatment includes rest, drinking plenty of fluids, and the use of over-the-counter medication (approved by your caregiver). HOME CARE INSTRUCTIONS    Drink enough fluids to keep your urine clear or pale yellow.   Eat soft, cold foods such as ice cream, frozen ice pops, or gelatin dessert.   Gargle with warm salt water (1 tsp salt per 1 qt of water).   If over age 7, throat lozenges may be used safely.   Only take over-the-counter or prescription medicines for pain, discomfort, or fever as directed by your caregiver. Do not take aspirin.  To help prevent spreading viral pharyngitis to others, avoid:  Mouth-to-mouth contact with others.   Sharing utensils for eating and drinking.   Coughing around others.  SEEK MEDICAL CARE IF:    You are better in a few days, then become worse.   You have a fever or pain not helped by pain medicines.   There are any other changes that concern you.  Document Released: 11/01/2004 Document Revised: 01/11/2011 Document Reviewed: 03/30/2010 Exit[employer] Patient Information 2012 [employer]. 

## 2011-05-02 NOTE — Progress Notes (Signed)
  Subjective:    Patient ID: [name], female    DOB: [birth year], 36 y.o.   MRN: [id number]  HPI  [name] is a 36 yr old female who presents today with chief complaint of sore throat.  She reports  that she started feeling bad on Sunday.  + sinus congestion, facial pain, +sore throat.  Energy is poor.  She denies fever at home. Has used ibuprofen without significant improvement. Husband is also sick.  Review of Systems See HPI    Objective:   Physical Exam  Constitutional: She appears well-developed and well-nourished. No distress.  HENT:  Head: Normocephalic and atraumatic.  Right Ear: Tympanic membrane and ear canal normal.  Left Ear: Tympanic membrane and ear canal normal.  Mouth/Throat: No oropharyngeal exudate, posterior oropharyngeal edema or posterior oropharyngeal erythema.  Cardiovascular: Normal rate and regular rhythm.   No murmur heard. Pulmonary/Chest: Effort normal and breath sounds normal. No respiratory distress. She has no wheezes. She has no rales. She exhibits no tenderness.  Lymphadenopathy:    She has no cervical adenopathy.          Assessment & Plan:

## 2011-05-16 ENCOUNTER — Ambulatory Visit: Payer: Self-pay | Admitting: Family

## 2011-05-21 ENCOUNTER — Ambulatory Visit: Payer: Self-pay | Admitting: Family

## 2011-05-28 ENCOUNTER — Telehealth: Payer: Self-pay | Admitting: *Deleted

## 2011-05-28 NOTE — Telephone Encounter (Signed)
Received samples from [facility] pt assistance (4 bottles). Left message on machine for pt to return my call. Samples have been left at front desk for pt to pick up.

## 2011-05-29 ENCOUNTER — Other Ambulatory Visit: Payer: Self-pay | Admitting: Family

## 2011-05-29 NOTE — Telephone Encounter (Signed)
Klonopin 1mg  #30 x no refill called to [place].

## 2011-05-29 NOTE — Telephone Encounter (Signed)
Attempted to reach pt, home # not in service. Reached pt at cell# and notified her of below.

## 2011-06-04 ENCOUNTER — Encounter: Payer: Self-pay | Admitting: Family

## 2011-06-04 ENCOUNTER — Ambulatory Visit (INDEPENDENT_AMBULATORY_CARE_PROVIDER_SITE_OTHER): Payer: Self-pay | Admitting: Family

## 2011-06-04 VITALS — BP 108/80 | HR 94 | Temp 98.5°F | Resp 16 | Ht 65.98 in | Wt 198.1 lb

## 2011-06-04 DIAGNOSIS — F329 Major depressive disorder, single episode, unspecified: Secondary | ICD-10-CM

## 2011-06-04 NOTE — Progress Notes (Signed)
  Subjective:    Patient ID: [name], female    DOB: [birth year], 36 y.o.   MRN: [id number]  HPI  [name] is a 36 yr old female who presents today for follow up of her depression.  Last visit she was started on cymbalta.  She reports no noticeable side effects since starting cymbalta.  She is now receiving cymbalta through patient assistance.  She reports that since she started cymbalta she is less tearful.  No longer feeling hopeless.  Feels like she is able to get out of bed without difficulty.  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  She is glad about this as she has been looking for work for a long time.  She is a little nervous about returning to work though as she has not worked since 2009.  Review of Systems See HPI     Objective:   Physical Exam  Constitutional: She appears well-developed and well-nourished. No distress.  Psychiatric: She has a normal mood and affect. Her behavior is normal. Judgment and thought content normal.          Assessment & Plan:

## 2011-06-04 NOTE — Assessment & Plan Note (Signed)
Improved.  Plan to continue cymbalta.  Follow up in 3 months.  15 minutes spent with pt today.  >50% of this time was spent counseling pt on her depression.

## 2011-06-04 NOTE — Patient Instructions (Signed)
Please schedule a follow up appointment in 3 months.

## 2011-06-21 ENCOUNTER — Other Ambulatory Visit: Payer: Self-pay | Admitting: Family

## 2011-06-22 NOTE — Telephone Encounter (Signed)
Klonopin #30 x no refills called to [place].

## 2011-07-10 ENCOUNTER — Other Ambulatory Visit: Payer: Self-pay | Admitting: Family

## 2011-08-11 ENCOUNTER — Other Ambulatory Visit: Payer: Self-pay | Admitting: Family

## 2011-08-13 NOTE — Telephone Encounter (Signed)
Klonopin rx called to [facility] #30 x no refills.

## 2011-08-27 ENCOUNTER — Ambulatory Visit: Payer: Self-pay | Admitting: Family

## 2011-09-17 ENCOUNTER — Other Ambulatory Visit: Payer: Self-pay | Admitting: Family

## 2011-09-17 NOTE — Telephone Encounter (Signed)
Klonopin refill called to [place] at [facility].

## 2011-10-15 ENCOUNTER — Other Ambulatory Visit: Payer: Self-pay | Admitting: Family

## 2011-10-15 NOTE — Telephone Encounter (Signed)
Klonopin 1mg  #30 x no refills called to [place] at [facility].  Pt was due for follow up in July and cancelled appt. Pt needs office visit before further refills can be given.

## 2011-10-16 ENCOUNTER — Other Ambulatory Visit: Payer: Self-pay | Admitting: *Deleted

## 2011-10-16 ENCOUNTER — Encounter: Payer: Self-pay | Admitting: Family

## 2011-10-16 ENCOUNTER — Ambulatory Visit (INDEPENDENT_AMBULATORY_CARE_PROVIDER_SITE_OTHER): Payer: Self-pay | Admitting: Family

## 2011-10-16 VITALS — BP 110/82 | HR 84 | Temp 98.4°F | Resp 18 | Wt 194.0 lb

## 2011-10-16 DIAGNOSIS — F341 Dysthymic disorder: Secondary | ICD-10-CM

## 2011-10-16 DIAGNOSIS — J069 Acute upper respiratory infection, unspecified: Secondary | ICD-10-CM

## 2011-10-16 NOTE — Telephone Encounter (Signed)
Received call from pt requesting refill of Cymbalta through pt assist ([employer]). Pt is using her last bottle. Re-order form printed and forwarded to Provider for signature. Pt cancelled f/u in July and has not r/s appt. Spoke with pt, she is currently experiencing head congestion and weakness. Scheduled pt appt for 3:15 today.

## 2011-10-16 NOTE — Telephone Encounter (Signed)
Re-order from faxed to [facility] [phone]. Awaiting shipment.

## 2011-10-16 NOTE — Telephone Encounter (Signed)
Spoke to patient and she will talk to [facility] about this at todays visit.

## 2011-10-16 NOTE — Assessment & Plan Note (Signed)
Stable on klonopin and PRN cymbalta.

## 2011-10-16 NOTE — Progress Notes (Signed)
Subjective:    Patient ID: [name], female    DOB: [birth year], 36 y.o.   MRN: [id number]  HPI  Pt reports head congestion, headache and fatigue since saturday. No fever.  Tried ibuprofen, allergy med with minimal improvement.  No cough.  Depression/anxiety-  She continues cymbalta.  Feels good.     Review of Systems See HPI  Past Medical History  Diagnosis Date  . Seizures   . Narcolepsy   . Restless leg   . Depression   . Hyperlipidemia   . Allergic rhinitis   . Narcolepsy     History   Social History  . Marital Status: Married    Spouse Name: N/A    Number of Children: 2  . Years of Education: N/A   Occupational History  . Unemployed- full time student     Completed 12th grade   Social History Main Topics  . Smoking status: Never Smoker   . Smokeless tobacco: Never Used  . Alcohol Use: Not on file  . Drug Use: No  . Sexually Active: Not on file   Other Topics Concern  . Not on file   Social History Narrative  . No narrative on file    Past Surgical History  Procedure Date  . Partial hysterectomy 2006  . Appendectomy 2006  . Tubal ligation 2002  . Appendectomy 2006    Family History  Problem Relation Age of Onset  . Heart attack Mother   . Diabetes Father   . Heart attack Father     Allergies  Allergen Reactions  . Eggs Or Egg-Derived Products Nausea And Vomiting  . Metoclopramide Hcl Other (See Comments)    Intensifies restless leg  . Penicillins Itching    Current Outpatient Prescriptions on File Prior to Visit  Medication Sig Dispense Refill  . Armodafinil (NUVIGIL) 250 MG tablet Take 1-2 tablets by mouth every morning.      . cetirizine-pseudoephedrine (ZYRTEC-D) 5-120 MG per tablet Take 1 tablet by mouth daily.       . Cyanocobalamin (B-12) 1000 MCG TBCR Take 1 tablet by mouth daily.      . DULoxetine (CYMBALTA) 60 MG capsule Take 1 capsule (60 mg total) by mouth daily.  21 capsule  0  . KLONOPIN 1 MG tablet TAKE ONE  TABLET BY MOUTH AT BEDTIME AS NEEDED FOR ANXIETY  30 each  0  . levETIRAcetam (KEPPRA) 750 MG tablet Take 2 tablets (1,500 mg total) by mouth every 12 (twelve) hours.      . nortriptyline (PAMELOR) 50 MG capsule Take 1 capsule (50 mg total) by mouth at bedtime.      Marland Kitchen omeprazole (PRILOSEC) 20 MG capsule Take 40 mg by mouth daily.       . ondansetron (ZOFRAN) 4 MG tablet Take 4 mg by mouth every 6 (six) hours as needed. For nausea or vomiting       . REQUIP 3 MG tablet TAKE ONE TABLET BY MOUTH AT BEDTIME  30 each  2  . simvastatin (ZOCOR) 10 MG tablet Take 10 mg by mouth at bedtime.      Marland Kitchen DISCONTD: desvenlafaxine (PRISTIQ) 100 MG 24 hr tablet Take 100 mg by mouth daily.        Marland Kitchen DISCONTD: gabapentin (NEURONTIN) 300 MG capsule Take 300 mg by mouth 4 (four) times daily.          BP 110/82  Pulse 84  Temp 98.4 F (36.9 C) (Oral)  Resp 18  Wt 194 lb 0.6 oz (88.016 kg)  SpO2 96%       Objective:   Physical Exam  Constitutional: She appears well-developed and well-nourished. No distress.  HENT:  Head: Normocephalic and atraumatic.  Right Ear: Tympanic membrane and ear canal normal.  Left Ear: Tympanic membrane and ear canal normal.  Mouth/Throat: No posterior oropharyngeal edema or posterior oropharyngeal erythema.  Cardiovascular: Normal rate and regular rhythm.   No murmur heard. Pulmonary/Chest: Effort normal and breath sounds normal. No respiratory distress. She has no wheezes. She has no rales. She exhibits no tenderness.  Lymphadenopathy:    She has no cervical adenopathy.  Skin: Skin is warm and dry.  Psychiatric: She has a normal mood and affect. Her behavior is normal. Judgment and thought content normal.          Assessment & Plan:

## 2011-10-16 NOTE — Patient Instructions (Addendum)
Please call if your symptoms worsen, if you develop fever >101, or if no improvement in 2-3 days. Follow up in 3 months.

## 2011-10-16 NOTE — Telephone Encounter (Signed)
Left message for patient to return my call.

## 2011-10-16 NOTE — Telephone Encounter (Signed)
Signed.

## 2011-10-16 NOTE — Assessment & Plan Note (Signed)
Recommended that pt continue fluids, motrin prn.  Call for follow up as outlined in AVS.

## 2011-10-20 ENCOUNTER — Encounter: Payer: Self-pay | Admitting: *Deleted

## 2011-10-20 ENCOUNTER — Encounter: Payer: Self-pay | Admitting: Family Medicine

## 2011-10-20 ENCOUNTER — Ambulatory Visit (INDEPENDENT_AMBULATORY_CARE_PROVIDER_SITE_OTHER): Payer: Self-pay | Admitting: Family Medicine

## 2011-10-20 VITALS — BP 118/82 | HR 83 | Temp 98.3°F | Ht 66.0 in | Wt 193.0 lb

## 2011-10-20 DIAGNOSIS — J329 Chronic sinusitis, unspecified: Secondary | ICD-10-CM

## 2011-10-20 MED ORDER — AZITHROMYCIN 250 MG PO TABS: ORAL_TABLET | ORAL | Status: AC

## 2011-10-20 NOTE — Patient Instructions (Addendum)

## 2011-10-20 NOTE — Progress Notes (Signed)
  Subjective:    Patient ID: [name], female    DOB: 01/02/1976, 36 y.o.   MRN: [id number]  HPI  Acute visit. Saturday clinic. Onset little over one week ago of malaise, nasal congestion, sore throat. No significant cough. Was seen last week by primary care provider and diagnosed with probable viral URI. She feels symptoms are progressive in terms of increasing headache and frontal sinus pressure. No fever. She's taking ibuprofen and over-the-counter antihistamine without improvement. No nausea or vomiting. She has allergy to penicillin.   Review of Systems  Constitutional: Negative for fever and chills.  HENT: Positive for congestion and sinus pressure. Negative for ear pain.   Respiratory: Negative for cough.   Neurological: Positive for headaches.       Objective:   Physical Exam  Constitutional: She appears well-developed and well-nourished.  HENT:  Right Ear: External ear normal.  Left Ear: External ear normal.  Mouth/Throat: Oropharynx is clear and moist.       Erythematous nasal mucosa bilaterally otherwise clear  Neck: Neck supple.  Cardiovascular: Normal rate and regular rhythm.   Pulmonary/Chest: Effort normal and breath sounds normal. No respiratory distress. She has no wheezes. She has no rales.  Lymphadenopathy:    She has no cervical adenopathy.          Assessment & Plan:  URI. Probable viral URI (at least initially) with patient describing progressive symptoms. Start Zithromax. Continue with nasal decongestant and saline nasal irrigation.

## 2011-10-30 NOTE — Telephone Encounter (Signed)
Received samples from [facility] (4 bottles). Notified pt's husband that samples have been placed at the front desk for pick up.

## 2011-11-07 ENCOUNTER — Ambulatory Visit: Payer: Self-pay | Admitting: Family

## 2011-11-09 ENCOUNTER — Ambulatory Visit (INDEPENDENT_AMBULATORY_CARE_PROVIDER_SITE_OTHER): Payer: Self-pay | Admitting: Family

## 2011-11-09 ENCOUNTER — Other Ambulatory Visit: Payer: Self-pay | Admitting: Family

## 2011-11-09 ENCOUNTER — Ambulatory Visit: Payer: Self-pay | Admitting: Family

## 2011-11-09 ENCOUNTER — Encounter: Payer: Self-pay | Admitting: Family

## 2011-11-09 VITALS — BP 118/80 | HR 80 | Temp 98.4°F | Resp 16 | Wt 193.0 lb

## 2011-11-09 DIAGNOSIS — F341 Dysthymic disorder: Secondary | ICD-10-CM

## 2011-11-09 DIAGNOSIS — Z23 Encounter for immunization: Secondary | ICD-10-CM

## 2011-11-09 DIAGNOSIS — Z0000 Encounter for general adult medical examination without abnormal findings: Secondary | ICD-10-CM

## 2011-11-09 DIAGNOSIS — Z1231 Encounter for screening mammogram for malignant neoplasm of breast: Secondary | ICD-10-CM

## 2011-11-09 MED ORDER — CLONAZEPAM 1 MG PO TABS: 1.0000 mg | ORAL_TABLET | Freq: Two times a day (BID) | ORAL | Status: DC | PRN

## 2011-11-09 NOTE — Progress Notes (Signed)
Subjective:    Patient ID: [name], female    DOB: 1975-03-23, 36 y.o.   MRN: [id number]  HPI  [name] is a 36 yr old female who presents today with chief complaint of fatigue.   Monday- reports sore throat, sinus problems, voice hoarsness, right ear pain- improved.  Reports subjective temp.  She reports sinus congestion is improved.  Clear nasal discharge.   Anxiety- she has been in the bed since she Monday.  Feels tearful about "missing things" with her kids due to her new job.  Reports that she had breakouts on estrogen patch- stopped.  This is being managed by GYN.   Review of Systems see HPI    Past Medical History  Diagnosis Date  . Seizures   . Narcolepsy   . Restless leg   . Depression   . Hyperlipidemia   . Allergic rhinitis   . Narcolepsy     History   Social History  . Marital Status: Married    Spouse Name: N/A    Number of Children: 2  . Years of Education: N/A   Occupational History  . Unemployed- full time student     Completed 12th grade   Social History Main Topics  . Smoking status: Never Smoker   . Smokeless tobacco: Never Used  . Alcohol Use: Not on file  . Drug Use: No  . Sexually Active: Not on file   Other Topics Concern  . Not on file   Social History Narrative  . No narrative on file    Past Surgical History  Procedure Date  . Partial hysterectomy 2006  . Appendectomy 2006  . Tubal ligation 2002  . Appendectomy 2006    Family History  Problem Relation Age of Onset  . Heart attack Mother   . Diabetes Father   . Heart attack Father     Allergies  Allergen Reactions  . Eggs Or Egg-Derived Products Nausea And Vomiting  . Metoclopramide Hcl Other (See Comments)    Intensifies restless leg  . Penicillins Itching    Current Outpatient Prescriptions on File Prior to Visit  Medication Sig Dispense Refill  . Armodafinil (NUVIGIL) 250 MG tablet Take 1-2 tablets by mouth every morning.      .  cetirizine-pseudoephedrine (ZYRTEC-D) 5-120 MG per tablet Take 1 tablet by mouth daily.       . Cyanocobalamin (B-12) 1000 MCG TBCR Take 1 tablet by mouth daily.      . DULoxetine (CYMBALTA) 60 MG capsule Take 1 capsule (60 mg total) by mouth daily.  21 capsule  0  . levETIRAcetam (KEPPRA) 750 MG tablet Take 2 tablets (1,500 mg total) by mouth every 12 (twelve) hours.      . nortriptyline (PAMELOR) 50 MG capsule Take 1 capsule (50 mg total) by mouth at bedtime.      Marland Kitchen omeprazole (PRILOSEC) 20 MG capsule Take 40 mg by mouth daily.       . ondansetron (ZOFRAN) 4 MG tablet Take 4 mg by mouth every 6 (six) hours as needed. For nausea or vomiting       . REQUIP 3 MG tablet TAKE ONE TABLET BY MOUTH AT BEDTIME  30 each  2  . simvastatin (ZOCOR) 10 MG tablet Take 10 mg by mouth at bedtime.      . clonazePAM (KLONOPIN) 1 MG tablet Take 1 tablet (1 mg total) by mouth 2 (two) times daily as needed for anxiety.  60 tablet  0  .  DISCONTD: desvenlafaxine (PRISTIQ) 100 MG 24 hr tablet Take 100 mg by mouth daily.        Marland Kitchen DISCONTD: gabapentin (NEURONTIN) 300 MG capsule Take 300 mg by mouth 4 (four) times daily.          BP 118/80  Pulse 80  Temp 98.4 F (36.9 C) (Oral)  Resp 16  Wt 193 lb (87.544 kg)  SpO2 99%    Objective:   Physical Exam  Constitutional: She appears well-developed and well-nourished. No distress.  HENT:  Head: Normocephalic and atraumatic.  Right Ear: Tympanic membrane and ear canal normal.  Left Ear: Tympanic membrane and ear canal normal.  Mouth/Throat: No oropharyngeal exudate or posterior oropharyngeal edema.  Cardiovascular: Normal rate and regular rhythm.   No murmur heard. Pulmonary/Chest: Effort normal and breath sounds normal. No respiratory distress. She has no wheezes. She has no rales. She exhibits no tenderness.  Skin: Skin is warm.  Psychiatric:       tearful          Assessment & Plan:

## 2011-11-09 NOTE — Patient Instructions (Addendum)
Please arrange a follow up with Dr. Dellia Cloud. Please schedule a follow up appointment in 1 month.

## 2011-11-11 NOTE — Assessment & Plan Note (Signed)
Deteriorated.  Pt does not wish to switch off of cymbalta.  Seems to be struggling with returning to work.  She denies SI or HI.  Is agreeable to return to counseling, has seen Dr. Dellia Cloud. Arrange follow up.  Suspect symptoms earlier this week were due to resolved URI.

## 2011-11-12 ENCOUNTER — Telehealth: Payer: Self-pay | Admitting: *Deleted

## 2011-11-12 ENCOUNTER — Encounter: Payer: Self-pay | Admitting: Family

## 2011-11-12 ENCOUNTER — Ambulatory Visit (HOSPITAL_BASED_OUTPATIENT_CLINIC_OR_DEPARTMENT_OTHER): Payer: Self-pay

## 2011-11-12 MED ORDER — CIPROFLOXACIN HCL 500 MG PO TABS: 500.0000 mg | ORAL_TABLET | Freq: Two times a day (BID) | ORAL | Status: DC

## 2011-11-12 NOTE — Telephone Encounter (Signed)
Received message from pt stating she is not feeling any better. Mucus has turned green and she feels very bad.  Please advise.

## 2011-11-12 NOTE — Telephone Encounter (Signed)
Received call from pt requesting note for work as she was unable to work yesterday or today. Reports that she is supposed to work through Thursday.  Please advise how many days to provide note.

## 2011-11-12 NOTE — Telephone Encounter (Signed)
Rx sent to pharmacy for cipro. Should be seen back in office if fever >101, or if no improvement in 2-3 days on abx.

## 2011-11-12 NOTE — Telephone Encounter (Signed)
See work note.

## 2011-11-12 NOTE — Telephone Encounter (Signed)
Left detailed message on cell/home# and to call if any questions.

## 2011-11-13 NOTE — Telephone Encounter (Signed)
Attempted to reach pt and left detailed message on home # that letter has been placed at the front desk for pick up and to call if any questions.

## 2011-11-16 ENCOUNTER — Ambulatory Visit (HOSPITAL_BASED_OUTPATIENT_CLINIC_OR_DEPARTMENT_OTHER)
Admission: RE | Admit: 2011-11-16 | Discharge: 2011-11-16 | Disposition: A | Discharge status: Home / Self Care | Payer: Self-pay | Source: Ambulatory Visit | Attending: Family | Admitting: Family

## 2011-11-16 ENCOUNTER — Ambulatory Visit (INDEPENDENT_AMBULATORY_CARE_PROVIDER_SITE_OTHER): Payer: Self-pay | Admitting: Family

## 2011-11-16 ENCOUNTER — Encounter: Payer: Self-pay | Admitting: Family

## 2011-11-16 VITALS — BP 108/78 | HR 88 | Temp 98.4°F | Resp 12 | Wt 193.1 lb

## 2011-11-16 DIAGNOSIS — J329 Chronic sinusitis, unspecified: Secondary | ICD-10-CM

## 2011-11-16 DIAGNOSIS — Z1231 Encounter for screening mammogram for malignant neoplasm of breast: Secondary | ICD-10-CM | POA: Insufficient documentation

## 2011-11-16 MED ORDER — SULFAMETHOXAZOLE-TMP DS 800-160 MG PO TABS: 1.0000 | ORAL_TABLET | Freq: Two times a day (BID) | ORAL | Status: DC

## 2011-11-16 NOTE — Progress Notes (Signed)
Subjective:    Patient ID: [name], female    DOB: [birth year], 36 y.o.   MRN: [id number]  HPI  [name] is a 36 yr old female who presents today for follow up.  She was seen 1 week ago with URI symptoms and now has developed worsening HA, ear pain/drainage and facial pain.  + maxillary and frontal sinus pain. Reports dark orange drainage from the left ear this AM.  L ear hurts more than the R ear.    Review of Systems See HPI  Past Medical History  Diagnosis Date  . Seizures   . Narcolepsy   . Restless leg   . Depression   . Hyperlipidemia   . Allergic rhinitis   . Narcolepsy     History   Social History  . Marital Status: Married    Spouse Name: N/A    Number of Children: 2  . Years of Education: N/A   Occupational History  . Unemployed- full time student     Completed 12th grade   Social History Main Topics  . Smoking status: Never Smoker   . Smokeless tobacco: Never Used  . Alcohol Use: Not on file  . Drug Use: No  . Sexually Active: Not on file   Other Topics Concern  . Not on file   Social History Narrative  . No narrative on file    Past Surgical History  Procedure Date  . Partial hysterectomy 2006  . Appendectomy 2006  . Tubal ligation 2002  . Appendectomy 2006    Family History  Problem Relation Age of Onset  . Heart attack Mother   . Diabetes Father   . Heart attack Father     Allergies  Allergen Reactions  . Eggs Or Egg-Derived Products Nausea And Vomiting  . Metoclopramide Hcl Other (See Comments)    Intensifies restless leg  . Penicillins Itching    Current Outpatient Prescriptions on File Prior to Visit  Medication Sig Dispense Refill  . Armodafinil (NUVIGIL) 250 MG tablet Take 1-2 tablets by mouth every morning.      . cetirizine-pseudoephedrine (ZYRTEC-D) 5-120 MG per tablet Take 1 tablet by mouth daily.       . clonazePAM (KLONOPIN) 1 MG tablet Take 1 tablet (1 mg total) by mouth 2 (two) times daily as needed for  anxiety.  60 tablet  0  . Cyanocobalamin (B-12) 1000 MCG TBCR Take 1 tablet by mouth daily.      . DULoxetine (CYMBALTA) 60 MG capsule Take 1 capsule (60 mg total) by mouth daily.  21 capsule  0  . levETIRAcetam (KEPPRA) 750 MG tablet Take 2 tablets (1,500 mg total) by mouth every 12 (twelve) hours.      . nortriptyline (PAMELOR) 50 MG capsule Take 1 capsule (50 mg total) by mouth at bedtime.      Marland Kitchen omeprazole (PRILOSEC) 20 MG capsule Take 40 mg by mouth daily.       . REQUIP 3 MG tablet TAKE ONE TABLET BY MOUTH AT BEDTIME  30 each  2  . simvastatin (ZOCOR) 10 MG tablet Take 10 mg by mouth at bedtime.      . ondansetron (ZOFRAN) 4 MG tablet Take 4 mg by mouth every 6 (six) hours as needed. For nausea or vomiting       . DISCONTD: desvenlafaxine (PRISTIQ) 100 MG 24 hr tablet Take 100 mg by mouth daily.        Marland Kitchen DISCONTD: gabapentin (NEURONTIN) 300 MG  capsule Take 300 mg by mouth 4 (four) times daily.          BP 108/78  Pulse 88  Temp 98.4 F (36.9 C) (Oral)  Resp 12  Wt 193 lb 1.9 oz (87.599 kg)  SpO2 99%       Objective:   Physical Exam  Constitutional: She appears well-developed and well-nourished. No distress.  HENT:  Head: Normocephalic and atraumatic.  Right Ear: Tympanic membrane and ear canal normal.  Left Ear: Tympanic membrane normal.  Mouth/Throat: No oropharyngeal exudate, posterior oropharyngeal edema or posterior oropharyngeal erythema.       Yellow drainage scant amount in left canal  Cardiovascular: Normal rate and regular rhythm.   No murmur heard. Pulmonary/Chest: Effort normal and breath sounds normal. No respiratory distress. She has no wheezes. She has no rales. She exhibits no tenderness.  Lymphadenopathy:    She has no cervical adenopathy.  Skin: Skin is warm and dry.  Psychiatric: She has a normal mood and affect. Her behavior is normal. Judgment and thought content normal.          Assessment & Plan:

## 2011-11-16 NOTE — Assessment & Plan Note (Signed)
No improvement thus far with cipro x 4-5 days. Will switch to bactrim ds (pt is uninsured so cost is an issue)  Also Pen allergic.  She is recommended to do regular sinus irrigation over the next few days.  Note provided for work.

## 2011-11-16 NOTE — Patient Instructions (Addendum)
Please call if no improvement by Monday. Use Neti pot daily.

## 2011-11-21 ENCOUNTER — Ambulatory Visit (INDEPENDENT_AMBULATORY_CARE_PROVIDER_SITE_OTHER): Payer: Self-pay | Admitting: Family

## 2011-11-21 ENCOUNTER — Encounter: Payer: Self-pay | Admitting: Family

## 2011-11-21 ENCOUNTER — Other Ambulatory Visit: Payer: Self-pay | Admitting: Family

## 2011-11-21 ENCOUNTER — Ambulatory Visit: Payer: Self-pay | Admitting: Family

## 2011-11-21 VITALS — BP 118/64 | HR 103 | Temp 98.5°F | Resp 14 | Wt 195.1 lb

## 2011-11-21 DIAGNOSIS — R0602 Shortness of breath: Secondary | ICD-10-CM

## 2011-11-21 DIAGNOSIS — R5383 Other fatigue: Secondary | ICD-10-CM

## 2011-11-21 DIAGNOSIS — R5381 Other malaise: Secondary | ICD-10-CM

## 2011-11-21 LAB — CBC WITH DIFFERENTIAL/PLATELET
Basophils Absolute: 0 10*3/uL (ref 0.0–0.1)
Basophils Relative: 0 % (ref 0–1)
Eosinophils Absolute: 0.1 10*3/uL (ref 0.0–0.7)
Eosinophils Relative: 2 % (ref 0–5)
Lymphs Abs: 1.7 10*3/uL (ref 0.7–4.0)
MCH: 27.1 pg (ref 26.0–34.0)
Neutrophils Relative %: 64 % (ref 43–77)
Platelets: 240 10*3/uL (ref 150–400)
RBC: 4.9 MIL/uL (ref 3.87–5.11)
RDW: 13.7 % (ref 11.5–15.5)

## 2011-11-21 MED ORDER — METHYLPREDNISOLONE SODIUM SUCC 125 MG IJ SOLR
125.0000 mg | Freq: Once | INTRAMUSCULAR | Status: AC
  Administered 2011-11-21: 125 mg via INTRAMUSCULAR

## 2011-11-21 MED ORDER — PREDNISONE 10 MG PO TABS: ORAL_TABLET | ORAL | Status: DC

## 2011-11-21 NOTE — Assessment & Plan Note (Addendum)
Lungs are clear to auscultation today and no visible tongue swelling.  She has sensation of tongue fullness.  I am concerned she may be having mild reaction to Bactrim.  I have asked her to stop bactrim.  Will plan short course of steroids.  IM solumedrol given in office.  No further indication for abx at this time.  She is requesting some baseline laboratories which I have sent.

## 2011-11-21 NOTE — Progress Notes (Signed)
Subjective:    Patient ID: [name], female    DOB: 20-Sep-1975, 36 y.o.   MRN: [id number]  HPI  Pt presents today with chief complaint of cough.  She was initially seen on 10/4 with URI symptoms.  She noted worsening sinus drainage and was started on cipro on 10/7.  No improvement on cipro.  10/13 she was started on bactrim DS.  She reports no improvement.  She reports feelnig the same.  Reports that it is difficult to swallow.  Reports feeling that her tongue is "swelled up."  This AM she reports that her chest was tight.  Felt short of breath walking in. Minimal sinus pressure "dull ache."  Little sinus drainage.  Worked 3 hours 2 days ago. Had to leave work as she could not concentrate.  Was having some balance difficulties on Monday. But no balance issues yesterday or today.   Review of Systems See HPI  Past Medical History  Diagnosis Date  . Seizures   . Narcolepsy   . Restless leg   . Depression   . Hyperlipidemia   . Allergic rhinitis   . Narcolepsy     History   Social History  . Marital Status: Married    Spouse Name: N/A    Number of Children: 2  . Years of Education: N/A   Occupational History  . Unemployed- full time student     Completed 12th grade   Social History Main Topics  . Smoking status: Never Smoker   . Smokeless tobacco: Never Used  . Alcohol Use: Not on file  . Drug Use: No  . Sexually Active: Not on file   Other Topics Concern  . Not on file   Social History Narrative  . No narrative on file    Past Surgical History  Procedure Date  . Partial hysterectomy 2006  . Appendectomy 2006  . Tubal ligation 2002  . Appendectomy 2006    Family History  Problem Relation Age of Onset  . Heart attack Mother   . Diabetes Father   . Heart attack Father     Allergies  Allergen Reactions  . Eggs Or Egg-Derived Products Nausea And Vomiting  . Metoclopramide Hcl Other (See Comments)    Intensifies restless leg  . Penicillins Itching    . Sulfa Antibiotics     Tongue swelling    Current Outpatient Prescriptions on File Prior to Visit  Medication Sig Dispense Refill  . Armodafinil (NUVIGIL) 250 MG tablet Take 1-2 tablets by mouth every morning.      . cetirizine-pseudoephedrine (ZYRTEC-D) 5-120 MG per tablet Take 1 tablet by mouth daily.       . clonazePAM (KLONOPIN) 1 MG tablet Take 1 tablet (1 mg total) by mouth 2 (two) times daily as needed for anxiety.  60 tablet  0  . Cyanocobalamin (B-12) 1000 MCG TBCR Take 1 tablet by mouth daily.      . DULoxetine (CYMBALTA) 60 MG capsule Take 1 capsule (60 mg total) by mouth daily.  21 capsule  0  . levETIRAcetam (KEPPRA) 750 MG tablet Take 2 tablets (1,500 mg total) by mouth every 12 (twelve) hours.      . nortriptyline (PAMELOR) 50 MG capsule Take 1 capsule (50 mg total) by mouth at bedtime.      Marland Kitchen omeprazole (PRILOSEC) 20 MG capsule Take 40 mg by mouth daily.       . ondansetron (ZOFRAN) 4 MG tablet Take 4 mg by mouth every 6 (  six) hours as needed. For nausea or vomiting       . REQUIP 3 MG tablet TAKE ONE TABLET BY MOUTH AT BEDTIME  30 each  2  . simvastatin (ZOCOR) 10 MG tablet Take 10 mg by mouth at bedtime.      Marland Kitchen DISCONTD: desvenlafaxine (PRISTIQ) 100 MG 24 hr tablet Take 100 mg by mouth daily.        Marland Kitchen DISCONTD: gabapentin (NEURONTIN) 300 MG capsule Take 300 mg by mouth 4 (four) times daily.         No current facility-administered medications on file prior to visit.    BP 118/64  Pulse 103  Temp 98.5 F (36.9 C) (Oral)  Resp 14  Wt 195 lb 1.3 oz (88.488 kg)  SpO2 96%       Objective:   Physical Exam  Constitutional: She appears well-developed and well-nourished. No distress.  HENT:  Head: Normocephalic and atraumatic.  Right Ear: Tympanic membrane and ear canal normal.  Left Ear: Tympanic membrane and ear canal normal.  Mouth/Throat: No oropharyngeal exudate, posterior oropharyngeal edema or posterior oropharyngeal erythema.  Cardiovascular: Normal rate  and regular rhythm.   No murmur heard. Pulmonary/Chest: Effort normal and breath sounds normal. No respiratory distress. She has no wheezes. She has no rales. She exhibits no tenderness.          Assessment & Plan:

## 2011-11-21 NOTE — Patient Instructions (Addendum)
Please call if you develop fever or if symptoms worsen. Go to ER if you develop severe tongue/lip swelling, hives. Follow up as needed.  Stop bactrim.

## 2011-11-22 ENCOUNTER — Telehealth: Payer: Self-pay | Admitting: *Deleted

## 2011-11-22 LAB — BASIC METABOLIC PANEL WITH GFR
BUN: 8 mg/dL (ref 6–23)
Calcium: 9.3 mg/dL (ref 8.4–10.5)
GFR, Est African American: >89 mL/min
GFR, Est Non African American: >89 mL/min
Potassium: 4.3 mEq/L (ref 3.5–5.3)
Sodium: 136 mEq/L (ref 135–145)

## 2011-11-22 LAB — HEMOGLOBIN A1C: Mean Plasma Glucose: 114 mg/dL (ref <117)

## 2011-11-22 NOTE — Telephone Encounter (Signed)
Debbora Dus from [employer] called requesting information concerning pt. Requested dx, dates of service and rx - dates. Verified with pt and is aware; pt agreed to release of information to [facility]. Pt was originally seen for URI.   Information includes: Dx URI 465.9             Dates of Service: 9/10, 9/14, 10/4, 10/11, and 10/16                                    Rx: 9/14    Azithromycin 250 mg x 5 days                                                            9/26   Tessalon cap 100mg  1 cap 3x a day                                          10/7   Cipro 500 mg 2 qd x 10 days                                          10/11 Bactrim DS 1 bid x 10 days                                          10/16 Prednisone dose pack 10 mg                                          10/16 Solu-medrol 125 mg injection  Is there any other information we need to provide? Please advise.

## 2011-11-23 ENCOUNTER — Telehealth: Payer: Self-pay | Admitting: Family

## 2011-11-23 ENCOUNTER — Telehealth: Payer: Self-pay | Admitting: *Deleted

## 2011-11-23 MED ORDER — METFORMIN HCL 500 MG PO TABS: 500.0000 mg | ORAL_TABLET | Freq: Two times a day (BID) | ORAL | Status: DC

## 2011-11-23 NOTE — Telephone Encounter (Signed)
Please also include diagnosis sinusitis.

## 2011-11-23 NOTE — Telephone Encounter (Signed)
Spoke to patient.  Reviewed elevated blood sugar.  Asked her to check sugar once a day and contact us in 1 week with her readings.  Call sooner if >200. She tells me that her husband had disposable lancets that she can use.

## 2011-11-23 NOTE — Telephone Encounter (Signed)
Called pt.  Reviewed that her sugar is likely elevated due to steroids. Advised her if sugar >350 over weekend to go to the ED.  Should come down as she completes steroids.  She should continue to monitor her blood sugar and call us on Monday to give [place] an update. She verbalizes understanding.

## 2011-11-23 NOTE — Telephone Encounter (Signed)
Pt called stating she has not eaten a lot today and her glucose level is 281. Please advise.

## 2011-11-26 ENCOUNTER — Telehealth: Payer: Self-pay | Admitting: *Deleted

## 2011-11-26 DIAGNOSIS — J329 Chronic sinusitis, unspecified: Secondary | ICD-10-CM

## 2011-11-26 MED ORDER — FLUCONAZOLE 150 MG PO TABS: 150.0000 mg | ORAL_TABLET | Freq: Once | ORAL | Status: DC

## 2011-11-26 NOTE — Telephone Encounter (Signed)
Attempted to reach pt and left message to return my call. 

## 2011-11-26 NOTE — Telephone Encounter (Signed)
Pls call pt and let her know that I have placed order for her to complete a CT of her sinus to further evaluate.  Also sent rx for diflucan for probable yeast infection to her pharmacy.

## 2011-11-26 NOTE — Telephone Encounter (Signed)
Notified pt. She has CT scheduled for tomorrow. Advised her we will call with the results once they are final.

## 2011-11-26 NOTE — Telephone Encounter (Signed)
Pt called stating she is not feeling any better. Completed prednisone yesterday. Continues to have "sinus headache and dizziness".  Reports that she just feels really bad. Reports FBS today of 117. Now has vaginal itching since last night.  Please advise.

## 2011-11-27 ENCOUNTER — Telehealth: Payer: Self-pay | Admitting: Family

## 2011-11-27 ENCOUNTER — Telehealth (INDEPENDENT_AMBULATORY_CARE_PROVIDER_SITE_OTHER): Payer: Self-pay | Admitting: *Deleted

## 2011-11-27 ENCOUNTER — Ambulatory Visit (HOSPITAL_BASED_OUTPATIENT_CLINIC_OR_DEPARTMENT_OTHER): Payer: Self-pay

## 2011-11-27 ENCOUNTER — Ambulatory Visit (HOSPITAL_BASED_OUTPATIENT_CLINIC_OR_DEPARTMENT_OTHER)
Admission: RE | Admit: 2011-11-27 | Discharge: 2011-11-27 | Disposition: A | Discharge status: Home / Self Care | Payer: Medicaid Other | Source: Ambulatory Visit | Attending: Family | Admitting: Family

## 2011-11-27 ENCOUNTER — Other Ambulatory Visit: Payer: Self-pay | Admitting: Family

## 2011-11-27 DIAGNOSIS — J329 Chronic sinusitis, unspecified: Secondary | ICD-10-CM

## 2011-11-27 DIAGNOSIS — N390 Urinary tract infection, site not specified: Secondary | ICD-10-CM

## 2011-11-27 DIAGNOSIS — R300 Dysuria: Secondary | ICD-10-CM

## 2011-11-27 DIAGNOSIS — J3489 Other specified disorders of nose and nasal sinuses: Secondary | ICD-10-CM | POA: Insufficient documentation

## 2011-11-27 LAB — POCT URINALYSIS DIPSTICK
Bilirubin, UA: NEGATIVE
Glucose, UA: NEGATIVE
Nitrite, UA: NEGATIVE
Spec Grav, UA: <=1.005

## 2011-11-27 NOTE — Telephone Encounter (Signed)
Received fax from [employer]. Faxed over requested info on 11/27/11  P: [phone] F: [phone]

## 2011-11-27 NOTE — Telephone Encounter (Signed)
Pt presented to the office reporting dysuria and dizziness. Just completed CT of sinuses today. Advised pt per verbal from Provider, we would check her urine and call her tomorrow with the results of both tests.

## 2011-11-28 MED ORDER — CIPROFLOXACIN HCL 500 MG PO TABS: 500.0000 mg | ORAL_TABLET | Freq: Two times a day (BID) | ORAL | Status: DC

## 2011-11-28 MED ORDER — ONDANSETRON HCL 4 MG PO TABS: 4.0000 mg | ORAL_TABLET | Freq: Four times a day (QID) | ORAL | Status: DC | PRN

## 2011-11-28 NOTE — Telephone Encounter (Signed)
Rx sent for cipro instead of ceftin as this will be cheaper for her.  zofran refill sent.

## 2011-11-28 NOTE — Telephone Encounter (Signed)
Notified pt. She states she never received rx for ceftin and her pharmacy never received our refill on her zofran.  Please advise.

## 2011-11-28 NOTE — Telephone Encounter (Signed)
Notified pt and she states she will arrange 1 month f/u with [name] after urine culture results are back and will arrange f/u with psychiatry soon.

## 2011-11-28 NOTE — Telephone Encounter (Signed)
Pls call pt and let her know that her sinus CT is negative. No sinusitis. Urine shows possible UTI, but should be covered by the ceftin. We have sent urine for culture.  We made referral for her to see psychiatry in the end of September.  Has she made this appointment?  I think that it is important that she see psychiatry and continue to follow with Dr. Dellia Cloud for her anxiety and depression.  I would like to see her back in 1 month.

## 2011-11-29 ENCOUNTER — Telehealth: Payer: Self-pay | Admitting: *Deleted

## 2011-11-29 LAB — URINE CULTURE: Organism ID, Bacteria: NO GROWTH

## 2011-11-29 NOTE — Telephone Encounter (Signed)
Pt called checking to see if an rx was called in for yeast inf. Told pt that an rx was sent to [facility] in [place].  Pt also wants to know when she is able to return to work. Please advise.

## 2011-11-29 NOTE — Telephone Encounter (Signed)
Pt may return to work tomorrow.

## 2011-11-30 NOTE — Telephone Encounter (Signed)
Called pt, left voice message to return call.

## 2011-11-30 NOTE — Telephone Encounter (Signed)
Notified pt and she states she will return to work on Sunday. Has started feeling better. Only had slight shortness of breath yesterday evening and attributes this to the fact that she tried to do more yesterday and has not been active for 3 weeks.

## 2011-12-07 ENCOUNTER — Ambulatory Visit: Payer: Self-pay | Admitting: Psychology

## 2011-12-10 ENCOUNTER — Ambulatory Visit: Payer: Self-pay | Admitting: Family

## 2011-12-11 ENCOUNTER — Ambulatory Visit: Payer: Self-pay | Admitting: Family

## 2011-12-12 ENCOUNTER — Ambulatory Visit (INDEPENDENT_AMBULATORY_CARE_PROVIDER_SITE_OTHER): Payer: Self-pay | Admitting: Family

## 2011-12-12 ENCOUNTER — Ambulatory Visit: Payer: Self-pay | Admitting: Family

## 2011-12-12 ENCOUNTER — Encounter: Payer: Self-pay | Admitting: Family

## 2011-12-12 VITALS — BP 120/80 | HR 90 | Temp 98.6°F | Resp 16 | Ht 65.0 in | Wt 192.1 lb

## 2011-12-12 DIAGNOSIS — R5383 Other fatigue: Secondary | ICD-10-CM | POA: Insufficient documentation

## 2011-12-12 DIAGNOSIS — R7309 Other abnormal glucose: Secondary | ICD-10-CM

## 2011-12-12 LAB — CBC
HCT: 39.2 % (ref 36.0–46.0)
RDW: 13.9 % (ref 11.5–15.5)
WBC: 7.6 10*3/uL (ref 4.0–10.5)

## 2011-12-12 LAB — SEDIMENTATION RATE: Sed Rate: 10 mm/hr (ref 0–22)

## 2011-12-12 MED ORDER — OMEPRAZOLE 20 MG PO CPDR: 40.0000 mg | DELAYED_RELEASE_CAPSULE | Freq: Every day | ORAL | Status: DC

## 2011-12-12 NOTE — Assessment & Plan Note (Signed)
Due to ongoing hyperglycemia despite being off of the steroids, I have asked her to start metformin.

## 2011-12-12 NOTE — Assessment & Plan Note (Signed)
She has a hx of + ANA in the past.  I would like to repeat her autoimmune profile to see if that could be a contributing factor to her fatigue.

## 2011-12-12 NOTE — Progress Notes (Signed)
Subjective:    Patient ID: [name], female    DOB: August 28, 1975, 36 y.o.   MRN: [id number]  HPI  [name] is a 36 yr old female who presents today with chief complaint of fatigue. She reports that she is not sleeping well at night. She has trouble falling asleep and trouble staying asleep.    Hyperglycemia- reports elevated blood sugars at home.  Has poor appetite. Was told that she has narcolepsy.  She sleeps on/offf during the day.  She reports sugar this AM was 131 fasting.  2 days ago it was 190 and this was fasting.       Review of Systems    see HPI  Past Medical History  Diagnosis Date  . Seizures   . Narcolepsy   . Restless leg   . Depression   . Hyperlipidemia   . Allergic rhinitis   . Narcolepsy     History   Social History  . Marital Status: Married    Spouse Name: N/A    Number of Children: 2  . Years of Education: N/A   Occupational History  . Unemployed- full time student     Completed 12th grade   Social History Main Topics  . Smoking status: Never Smoker   . Smokeless tobacco: Never Used  . Alcohol Use: Not on file  . Drug Use: No  . Sexually Active: Not on file   Other Topics Concern  . Not on file   Social History Narrative  . No narrative on file    Past Surgical History  Procedure Date  . Partial hysterectomy 2006  . Appendectomy 2006  . Tubal ligation 2002  . Appendectomy 2006    Family History  Problem Relation Age of Onset  . Heart attack Mother   . Diabetes Father   . Heart attack Father     Allergies  Allergen Reactions  . Eggs Or Egg-Derived Products Nausea And Vomiting  . Metoclopramide Hcl Other (See Comments)    Intensifies restless leg  . Penicillins Itching  . Sulfa Antibiotics     Tongue swelling    Current Outpatient Prescriptions on File Prior to Visit  Medication Sig Dispense Refill  . Armodafinil (NUVIGIL) 250 MG tablet Take 1-2 tablets by mouth every morning.      .  cetirizine-pseudoephedrine (ZYRTEC-D) 5-120 MG per tablet Take 1 tablet by mouth daily.       . clonazePAM (KLONOPIN) 1 MG tablet Take 1 tablet (1 mg total) by mouth 2 (two) times daily as needed for anxiety.  60 tablet  0  . DULoxetine (CYMBALTA) 60 MG capsule Take 1 capsule (60 mg total) by mouth daily.  21 capsule  0  . levETIRAcetam (KEPPRA) 750 MG tablet Take 2 tablets (1,500 mg total) by mouth every 12 (twelve) hours.      . metFORMIN (GLUCOPHAGE) 500 MG tablet Take 1 tablet (500 mg total) by mouth 2 (two) times daily with a meal.  60 tablet  0  . nortriptyline (PAMELOR) 50 MG capsule Take 1 capsule (50 mg total) by mouth at bedtime.      . ondansetron (ZOFRAN) 4 MG tablet Take 1 tablet (4 mg total) by mouth every 6 (six) hours as needed. For nausea or vomiting  20 tablet  0  . REQUIP 3 MG tablet TAKE ONE TABLET BY MOUTH AT BEDTIME  30 each  2  . simvastatin (ZOCOR) 10 MG tablet Take 10 mg by mouth at bedtime.      . [  DISCONTINUED] omeprazole (PRILOSEC) 20 MG capsule Take 40 mg by mouth daily.       . [DISCONTINUED] desvenlafaxine (PRISTIQ) 100 MG 24 hr tablet Take 100 mg by mouth daily.        . [DISCONTINUED] gabapentin (NEURONTIN) 300 MG capsule Take 300 mg by mouth 4 (four) times daily.          BP 120/80  Pulse 90  Temp 98.6 F (37 C) (Oral)  Resp 16  Ht 5\' 5"  (1.651 m)  Wt 192 lb 1.3 oz (87.127 kg)  BMI 31.96 kg/m2  SpO2 99%    Objective:   Physical Exam  Constitutional: She appears well-developed and well-nourished. No distress.  Cardiovascular: Normal rate and regular rhythm.   No murmur heard. Pulmonary/Chest: Effort normal and breath sounds normal. No respiratory distress. She has no wheezes. She has no rales. She exhibits no tenderness.  Psychiatric: She has a normal mood and affect. Her behavior is normal. Judgment and thought content normal.          Assessment & Plan:

## 2011-12-12 NOTE — Patient Instructions (Addendum)
Please complete your blood work prior to leaving.  Follow up in 6 weeks, sooner if problems/concerns.

## 2011-12-13 LAB — ANTI-NUCLEAR AB-TITER (ANA TITER): ANA Titer 1: 1:320 {titer} — ABNORMAL HIGH

## 2011-12-13 LAB — RHEUMATOID FACTOR: Rhuematoid fact SerPl-aCnc: <10 IU/mL (ref <=14)

## 2011-12-14 ENCOUNTER — Telehealth: Payer: Self-pay | Admitting: Family

## 2011-12-14 DIAGNOSIS — R768 Other specified abnormal immunological findings in serum: Secondary | ICD-10-CM

## 2011-12-14 NOTE — Telephone Encounter (Signed)
Notified pt and she will return on Monday. Order given to the lab.

## 2011-12-14 NOTE — Telephone Encounter (Signed)
Pls call pt and let her know that her ANA (autoimmune test) remains elevated.  I would like her to return to the lab for some additional testing please.  See order.

## 2011-12-17 ENCOUNTER — Telehealth: Payer: Self-pay | Admitting: Family

## 2011-12-17 ENCOUNTER — Emergency Department (HOSPITAL_BASED_OUTPATIENT_CLINIC_OR_DEPARTMENT_OTHER)
Admission: EM | Admit: 2011-12-17 | Discharge: 2011-12-17 | Disposition: A | Discharge status: Home / Self Care | Payer: Medicaid Other | Attending: Emergency Medicine | Admitting: Emergency Medicine

## 2011-12-17 ENCOUNTER — Other Ambulatory Visit: Payer: Self-pay | Admitting: Family

## 2011-12-17 ENCOUNTER — Encounter (HOSPITAL_BASED_OUTPATIENT_CLINIC_OR_DEPARTMENT_OTHER): Payer: Self-pay | Admitting: Family Medicine

## 2011-12-17 DIAGNOSIS — F3289 Other specified depressive episodes: Secondary | ICD-10-CM | POA: Insufficient documentation

## 2011-12-17 DIAGNOSIS — E785 Hyperlipidemia, unspecified: Secondary | ICD-10-CM | POA: Insufficient documentation

## 2011-12-17 DIAGNOSIS — E119 Type 2 diabetes mellitus without complications: Secondary | ICD-10-CM | POA: Insufficient documentation

## 2011-12-17 DIAGNOSIS — G4700 Insomnia, unspecified: Secondary | ICD-10-CM | POA: Insufficient documentation

## 2011-12-17 DIAGNOSIS — G40802 Other epilepsy, not intractable, without status epilepticus: Secondary | ICD-10-CM | POA: Insufficient documentation

## 2011-12-17 DIAGNOSIS — Z79899 Other long term (current) drug therapy: Secondary | ICD-10-CM | POA: Insufficient documentation

## 2011-12-17 DIAGNOSIS — G2581 Restless legs syndrome: Secondary | ICD-10-CM | POA: Insufficient documentation

## 2011-12-17 DIAGNOSIS — F329 Major depressive disorder, single episode, unspecified: Secondary | ICD-10-CM

## 2011-12-17 DIAGNOSIS — F419 Anxiety disorder, unspecified: Secondary | ICD-10-CM

## 2011-12-17 DIAGNOSIS — H02109 Unspecified ectropion of unspecified eye, unspecified eyelid: Secondary | ICD-10-CM | POA: Insufficient documentation

## 2011-12-17 DIAGNOSIS — F411 Generalized anxiety disorder: Secondary | ICD-10-CM | POA: Insufficient documentation

## 2011-12-17 LAB — GLUCOSE, CAPILLARY

## 2011-12-17 MED ORDER — METFORMIN HCL 500 MG PO TABS
500.0000 mg | ORAL_TABLET | Freq: Once | ORAL | Status: AC
  Administered 2011-12-17: 500 mg via ORAL
  Filled 2011-12-17: qty 1

## 2011-12-17 MED ORDER — DIAZEPAM 2 MG PO TABS
2.0000 mg | ORAL_TABLET | Freq: Once | ORAL | Status: AC
  Administered 2011-12-17: 2 mg via ORAL
  Filled 2011-12-17: qty 1

## 2011-12-17 MED ORDER — KETOROLAC TROMETHAMINE 60 MG/2ML IM SOLN
60.0000 mg | Freq: Once | INTRAMUSCULAR | Status: AC
  Administered 2011-12-17: 60 mg via INTRAMUSCULAR
  Filled 2011-12-17: qty 2

## 2011-12-17 MED ORDER — LORAZEPAM 1 MG PO TABS
2.0000 mg | ORAL_TABLET | Freq: Once | ORAL | Status: AC
  Administered 2011-12-17: 2 mg via ORAL
  Filled 2011-12-17: qty 2

## 2011-12-17 MED ORDER — DIAZEPAM 5 MG PO TABS: 5.0000 mg | ORAL_TABLET | Freq: Two times a day (BID) | ORAL | Status: DC

## 2011-12-17 NOTE — ED Provider Notes (Signed)
History     CSN: [id number]  Arrival date & time 12/17/11  1117   First MD Initiated Contact with Patient 12/17/11 1158      Chief Complaint  Patient presents with  . Anxiety  . Insomnia    (Consider location/radiation/quality/duration/timing/severity/associated sxs/prior treatment) HPI Comments: Patient with history of narcolepsy, anxiety, depression, and epilepsy presents with complaint of "panic attack" and insomnia. Patient states that she was at her PCP upstairs to discuss some recent blood work results. Patient states that she has been under a lot of stress due to being informed that her ANA was positive, recent diagnosis of diabetes mellitus, and financial stressors. She states that while upstairs she felt very anxious and asked to see her physician for anxiety medication adjustment. Patient informed that physician not in the office, and she did not want to wait to be seen at 3 pm. Denies fever or chills. Denies NVD or abdominal pain. Denies CP, SOB, or palpitations. Denies suicidal or homicidal ideations.   The history is provided by the patient. No language interpreter was used.    Past Medical History  Diagnosis Date  . Seizures   . Narcolepsy   . Restless leg   . Depression   . Hyperlipidemia   . Allergic rhinitis   . Narcolepsy   . Diabetes mellitus without complication     Past Surgical History  Procedure Date  . Partial hysterectomy 2006  . Appendectomy 2006  . Tubal ligation 2002  . Appendectomy 2006    Family History  Problem Relation Age of Onset  . Heart attack Mother   . Diabetes Father   . Heart attack Father     History  Substance Use Topics  . Smoking status: Never Smoker   . Smokeless tobacco: Never Used  . Alcohol Use: No    OB History    Grav Para Term Preterm Abortions TAB SAB Ect Mult Living                  Review of Systems  Constitutional: Negative for fever and chills.  Respiratory: Negative for chest tightness and  shortness of breath.   Cardiovascular: Negative for palpitations.  Gastrointestinal: Negative for nausea, vomiting, abdominal pain and diarrhea.  Psychiatric/Behavioral: Positive for sleep disturbance. Negative for suicidal ideas and self-injury. The patient is nervous/anxious.     Allergies  Eggs or egg-derived products; Metoclopramide hcl; Penicillins; and Sulfa antibiotics  Home Medications   Current Outpatient Rx  Name  Route  Sig  Dispense  Refill  . ARMODAFINIL 250 MG PO TABS      Take 1-2 tablets by mouth every morning.         Marland Kitchen CETIRIZINE-PSEUDOEPHEDRINE ER 5-120 MG PO TB12   Oral   Take 1 tablet by mouth daily.          Marland Kitchen CLONAZEPAM 1 MG PO TABS   Oral   Take 1 tablet (1 mg total) by mouth 2 (two) times daily as needed for anxiety.   60 tablet   0   . DULOXETINE HCL 60 MG PO CPEP   Oral   Take 1 capsule (60 mg total) by mouth daily.   21 capsule   0   . LEVETIRACETAM 750 MG PO TABS   Oral   Take 2 tablets (1,500 mg total) by mouth every 12 (twelve) hours.         Marland Kitchen METFORMIN HCL 500 MG PO TABS   Oral   Take 1 tablet (  500 mg total) by mouth 2 (two) times daily with a meal.   60 tablet   0   . NORTRIPTYLINE HCL 50 MG PO CAPS   Oral   Take 1 capsule (50 mg total) by mouth at bedtime.         . OMEPRAZOLE 20 MG PO CPDR   Oral   Take 2 capsules (40 mg total) by mouth daily.   60 capsule   5   . ONDANSETRON HCL 4 MG PO TABS   Oral   Take 1 tablet (4 mg total) by mouth every 6 (six) hours as needed. For nausea or vomiting   20 tablet   0   . REQUIP 3 MG PO TABS      TAKE ONE TABLET BY MOUTH AT BEDTIME   30 each   2   . SIMVASTATIN 10 MG PO TABS   Oral   Take 10 mg by mouth at bedtime.           BP 127/80  Pulse 74  Temp 98.1 F (36.7 C) (Oral)  Resp 18  Ht 5\' 6"  (1.676 m)  Wt 192 lb (87.091 kg)  BMI 30.99 kg/m2  SpO2 99%  Physical Exam  Nursing note and vitals reviewed. Constitutional: She appears well-developed and  well-nourished.  HENT:  Head: Normocephalic and atraumatic.  Mouth/Throat: Oropharynx is clear and moist.  Eyes: Conjunctivae normal and EOM are normal. Pupils are equal, round, and reactive to light. No scleral icterus.  Neck: Normal range of motion. Neck supple.  Cardiovascular: Normal rate, regular rhythm and normal heart sounds.   Pulmonary/Chest: Effort normal and breath sounds normal.  Abdominal: Soft. There is no tenderness.  Lymphadenopathy:    She has no cervical adenopathy.  Neurological: She is alert.  Skin: Skin is warm.    ED Course  Procedures (including critical care time)  Labs Reviewed  GLUCOSE, CAPILLARY - Abnormal; Notable for the following:    Glucose-Capillary 130 (*)     All other components within normal limits   No results found.   1. Anxiety   2. Depression   3. Insomnia       MDM  Patient presented with complaint of anxiety and insomnia. Patient given ativan with improvement and discharged on a short course of Valium. Patient instructed that she must follow-up with PCP for additional anxiety/sleep medication adjustment. Return precautions given. No red flags for suicidal or homicidal tendencies.        Pixie Casino, PA-C 12/17/11 1404

## 2011-12-17 NOTE — ED Notes (Signed)
CBG 130. V. Henson RN notified.

## 2011-12-17 NOTE — Telephone Encounter (Signed)
Patient called for appointment  12/17/11 @ 9:30am ,   Wanted to be seen today  Appointment was offered for 3:30   With Dr Rodena Medin, patient states she cannot wait that long will go to ED

## 2011-12-17 NOTE — ED Notes (Signed)
Pt c/o feeling anxious and not being able to sleep x 2 nights. Pt sts Klonopin not working anymore. Pt sts she had labs drawn today but PCP office is closed. Pt reports h/o seeing therapist Dr. Dellia Cloud but has not followed up. Pt denies SI, HI, denies hallucinations.

## 2011-12-19 LAB — ANA: Anti Nuclear Antibody(ANA): POSITIVE — AB

## 2011-12-19 LAB — ANTI-NUCLEAR AB-TITER (ANA TITER)

## 2011-12-20 ENCOUNTER — Telehealth: Payer: Self-pay | Admitting: Family

## 2011-12-20 DIAGNOSIS — R768 Other specified abnormal immunological findings in serum: Secondary | ICD-10-CM

## 2011-12-20 NOTE — Telephone Encounter (Signed)
Pt returned my call and states that she has lost her klonopin that she picked up on 12/06/11. States that she has been out for a few days and has not been able to sleep. Reports lack of sleep is causing her great anxiety. Went to the ER on 12/17/11 and was given valium rx for home. Pt states it only seems to put her in a stupor and doesn't really help her sleep. Advised pt that per office policy we would not be able to replace lost Rx. Pt states she has taken 1/2 a tablet of her husband's klonopin a couple of times and feels that is the only thing that is keeping her maintained at this time. Pt is also requesting a letter for shortened work schedule to help her better cope and get a handle on things. Also states that she feels her recent UTI symptoms are due to the fact that she is unable to leave her computer/phone at work for more than 11 minutes per day and has to hold her urine for several hours. Pt is requesting letter to her employer stating she should be allowed to go to the restroom for bathroom breaks as needed. Scheduled pt appt for tomorrow at 2:30 to discuss.

## 2011-12-20 NOTE — Telephone Encounter (Signed)
Left message on cell# to return my call. 

## 2011-12-20 NOTE — Telephone Encounter (Signed)
Left message on cell requesting call back.  When pt calls back pls let us know that her ANA remains elevated.  I would like for her to meet with rheumatology for further evaluation.  Unfortunately, the rheumatologists are not part of the [facility] network, so it will likely be out of pocket.  We can run any lab work they order through [facility] though.  Order pended below.

## 2011-12-20 NOTE — Telephone Encounter (Signed)
Caller: [name]/Patient; Patient Name: [name]; PCP: Peggyann Juba, [name] (Adults only); Best Callback Phone Number: [phone] Onset:  12/20/11 Patient returned call and message was given as per Sandford Craze.  Patient states that she cannot get in to see the Rheumatologist until January 7th.  Patient also states that she needs Doctors notes for work and would like an appointment with Dr. Peggyann Juba.  Please call patient back with response to information above.  May reach patient on cell if unable to reach on home phone [phone].

## 2011-12-21 ENCOUNTER — Encounter: Payer: Self-pay | Admitting: Family

## 2011-12-21 ENCOUNTER — Ambulatory Visit (INDEPENDENT_AMBULATORY_CARE_PROVIDER_SITE_OTHER): Payer: Self-pay | Admitting: Family

## 2011-12-21 VITALS — BP 112/82 | HR 78 | Temp 98.2°F | Resp 16 | Ht 65.0 in | Wt 193.0 lb

## 2011-12-21 DIAGNOSIS — F341 Dysthymic disorder: Secondary | ICD-10-CM

## 2011-12-21 DIAGNOSIS — R799 Abnormal finding of blood chemistry, unspecified: Secondary | ICD-10-CM

## 2011-12-21 MED ORDER — VILAZODONE HCL 10 & 20 & 40 MG PO KIT: 1.0000 | PACK | Freq: Every day | ORAL | Status: DC

## 2011-12-21 MED ORDER — CLONAZEPAM 1 MG PO TABS: 1.0000 mg | ORAL_TABLET | Freq: Two times a day (BID) | ORAL | Status: DC | PRN

## 2011-12-21 NOTE — Patient Instructions (Addendum)
Start viibryd. Overlap viibryd with cymbalta x 1 week. Stop cymbalta on week two of viibryd. Follow up in 1 month Schedule a follow up appointment with Dr. Dellia Cloud.

## 2011-12-21 NOTE — ED Provider Notes (Signed)
History/physical exam/procedure(s) were performed by non-physician practitioner and as supervising physician I was immediately available for consultation/collaboration. I have reviewed all notes and am in agreement with care and plan.    Hilario Quarry, MD 12/21/11 [phone]

## 2011-12-21 NOTE — Progress Notes (Signed)
Subjective:    Patient ID: [name], female    DOB: 16-Aug-1975, 36 y.o.   MRN: [id number]  HPI  [name] is a 36 yr old female who presents today for follow up. She was recently evaluated in the ED for anxiety and was given rx for short course of prn valium.  She reports that she lost her klonopin and the valium is just making her drowsy.  She reports that she is not sleeping well.  Mood is described as labile.  Tearful one moment then laughing another.  Feels "like a roller coaster."  Denies suicidal ideation or homicidal ideation.  Has not been back to work.    She reports that she  "just lays around."She is following with Dr. Dellia Cloud.     Review of Systems + sinus congestion  Past Medical History  Diagnosis Date  . Seizures   . Narcolepsy   . Restless leg   . Depression   . Hyperlipidemia   . Allergic rhinitis   . Narcolepsy   . Diabetes mellitus without complication     History   Social History  . Marital Status: Married    Spouse Name: N/A    Number of Children: 2  . Years of Education: N/A   Occupational History  . Unemployed- full time student     Completed 12th grade   Social History Main Topics  . Smoking status: Never Smoker   . Smokeless tobacco: Never Used  . Alcohol Use: No  . Drug Use: No  . Sexually Active: Not on file   Other Topics Concern  . Not on file   Social History Narrative  . No narrative on file    Past Surgical History  Procedure Date  . Partial hysterectomy 2006  . Appendectomy 2006  . Tubal ligation 2002  . Appendectomy 2006    Family History  Problem Relation Age of Onset  . Heart attack Mother   . Diabetes Father   . Heart attack Father     Allergies  Allergen Reactions  . Eggs Or Egg-Derived Products Nausea And Vomiting  . Metoclopramide Hcl Other (See Comments)    Intensifies restless leg  . Penicillins Itching  . Sulfa Antibiotics     Tongue swelling    Current Outpatient Prescriptions on File  Prior to Visit  Medication Sig Dispense Refill  . Armodafinil (NUVIGIL) 250 MG tablet Take 1-2 tablets by mouth every morning.      . cetirizine-pseudoephedrine (ZYRTEC-D) 5-120 MG per tablet Take 1 tablet by mouth daily.       . clonazePAM (KLONOPIN) 1 MG tablet Take 1 tablet (1 mg total) by mouth 2 (two) times daily as needed for anxiety.  60 tablet  0  . diazepam (VALIUM) 5 MG tablet Take 1 tablet (5 mg total) by mouth 2 (two) times daily.  10 tablet  0  . levETIRAcetam (KEPPRA) 750 MG tablet Take 2 tablets (1,500 mg total) by mouth every 12 (twelve) hours.      . metFORMIN (GLUCOPHAGE) 500 MG tablet Take 1 tablet (500 mg total) by mouth 2 (two) times daily with a meal.  60 tablet  0  . nortriptyline (PAMELOR) 50 MG capsule Take 1 capsule (50 mg total) by mouth at bedtime.      Marland Kitchen omeprazole (PRILOSEC) 20 MG capsule Take 2 capsules (40 mg total) by mouth daily.  60 capsule  5  . ondansetron (ZOFRAN) 4 MG tablet Take 1 tablet (4 mg  total) by mouth every 6 (six) hours as needed. For nausea or vomiting  20 tablet  0  . REQUIP 3 MG tablet TAKE ONE TABLET BY MOUTH AT BEDTIME  30 each  2  . simvastatin (ZOCOR) 10 MG tablet Take 10 mg by mouth at bedtime.      . Vilazodone HCl (VIIBRYD) 10 & 20 & 40 MG KIT Take 1 tablet by mouth daily.  1 kit  0  . [DISCONTINUED] desvenlafaxine (PRISTIQ) 100 MG 24 hr tablet Take 100 mg by mouth daily.        . [DISCONTINUED] gabapentin (NEURONTIN) 300 MG capsule Take 300 mg by mouth 4 (four) times daily.          BP 112/82  Pulse 78  Temp 98.2 F (36.8 C) (Oral)  Resp 16  Ht 5\' 5"  (1.651 m)  Wt 193 lb (87.544 kg)  BMI 32.12 kg/m2  SpO2 98%       Objective:   Physical Exam  Constitutional: She is oriented to person, place, and time. She appears well-developed and well-nourished. No distress.  HENT:  Head: Normocephalic and atraumatic.  Neurological: She is alert and oriented to person, place, and time.  Skin: Skin is warm and dry.  Psychiatric: She  has a normal mood and affect. Her behavior is normal. Judgment and thought content normal.          Assessment & Plan:  25 minutes spent with pt today. >50% of this time was spent counseling pt on her anxiety/depression.

## 2011-12-21 NOTE — Telephone Encounter (Signed)
Will discuss at apt

## 2011-12-24 NOTE — Assessment & Plan Note (Addendum)
I have made a referral for her to meet with rheumatology for further evaluation.  This may be a possible explanation for her complaint of fatigue.

## 2011-12-24 NOTE — Assessment & Plan Note (Signed)
Deteriorated.  Will transition from cymbalta to  viibryd- samples provided today. Refill was provided today for klonopin.  I encouraged her to arrange follow up with Dr. Dellia Cloud for additional counseling. A controlled substance contract was signed today.

## 2012-01-01 ENCOUNTER — Telehealth: Payer: Self-pay | Admitting: *Deleted

## 2012-01-01 NOTE — Telephone Encounter (Signed)
Received fax from Debbora Dus at [employer] requesting additional office notes and labs from 12/06/11 to present. Information printed and faxed to [phone]. Left voice message on Shaniqua's mailbox at ext [phone] that info. Has been faxed and to call if any questions.

## 2012-01-07 ENCOUNTER — Telehealth: Payer: Self-pay | Admitting: *Deleted

## 2012-01-07 ENCOUNTER — Telehealth: Payer: Self-pay | Admitting: Family

## 2012-01-07 MED ORDER — ROPINIROLE HCL 3 MG PO TABS: 3.0000 mg | ORAL_TABLET | Freq: Every day | ORAL | Status: DC

## 2012-01-07 NOTE — Telephone Encounter (Signed)
Refill sent to pharmacy, #30 x 2 refills.

## 2012-01-07 NOTE — Telephone Encounter (Signed)
Refill- ropinirole hcl 3mg  tablet. Take one tablet by mouth at bedtime. Qty 30 last fill 10.30.13

## 2012-01-07 NOTE — Telephone Encounter (Signed)
Received fax from pt of FMLA paperwork that she is requesting [place] to complete. Pt requests leave to begin 11/09/11. Papers forwarded to Provider for review / completion.

## 2012-01-08 ENCOUNTER — Telehealth: Payer: Self-pay | Admitting: Family

## 2012-01-08 MED ORDER — SIMVASTATIN 10 MG PO TABS: 10.0000 mg | ORAL_TABLET | Freq: Every day | ORAL | Status: DC

## 2012-01-08 NOTE — Telephone Encounter (Signed)
Refill- simvastatin 10mg  tablet. Take one tablet by mouth at bedtime. Qty 30 date written 12.3.13

## 2012-01-09 NOTE — Telephone Encounter (Signed)
Paperwork has been completed

## 2012-01-09 NOTE — Telephone Encounter (Signed)
Notified pt of paperwork completion. Pt requests that we fax form to employer and mail her a copy. Form faxed to the Centralized Leave Management Team (CLMT/HR) at [phone], mailed to pt and sent for scanning.

## 2012-01-13 ENCOUNTER — Encounter (HOSPITAL_COMMUNITY): Payer: Self-pay | Admitting: *Deleted

## 2012-01-13 ENCOUNTER — Emergency Department (HOSPITAL_COMMUNITY)
Admission: EM | Admit: 2012-01-13 | Discharge: 2012-01-14 | Disposition: A | Discharge status: Home / Self Care | Payer: Medicaid Other | Attending: Emergency Medicine | Admitting: Emergency Medicine

## 2012-01-13 DIAGNOSIS — E119 Type 2 diabetes mellitus without complications: Secondary | ICD-10-CM | POA: Insufficient documentation

## 2012-01-13 DIAGNOSIS — J309 Allergic rhinitis, unspecified: Secondary | ICD-10-CM | POA: Insufficient documentation

## 2012-01-13 DIAGNOSIS — G47419 Narcolepsy without cataplexy: Secondary | ICD-10-CM | POA: Insufficient documentation

## 2012-01-13 DIAGNOSIS — Z79899 Other long term (current) drug therapy: Secondary | ICD-10-CM | POA: Insufficient documentation

## 2012-01-13 DIAGNOSIS — F3289 Other specified depressive episodes: Secondary | ICD-10-CM | POA: Insufficient documentation

## 2012-01-13 DIAGNOSIS — G40909 Epilepsy, unspecified, not intractable, without status epilepticus: Secondary | ICD-10-CM | POA: Insufficient documentation

## 2012-01-13 DIAGNOSIS — G2581 Restless legs syndrome: Secondary | ICD-10-CM | POA: Insufficient documentation

## 2012-01-13 DIAGNOSIS — R569 Unspecified convulsions: Secondary | ICD-10-CM

## 2012-01-13 DIAGNOSIS — F329 Major depressive disorder, single episode, unspecified: Secondary | ICD-10-CM | POA: Insufficient documentation

## 2012-01-13 DIAGNOSIS — E785 Hyperlipidemia, unspecified: Secondary | ICD-10-CM | POA: Insufficient documentation

## 2012-01-13 MED ORDER — LIDOCAINE VISCOUS 2 % MT SOLN
20.0000 mL | Freq: Once | OROMUCOSAL | Status: AC
  Administered 2012-01-14: 20 mL via OROMUCOSAL
  Filled 2012-01-13: qty 20

## 2012-01-13 MED ORDER — LORAZEPAM 2 MG/ML IJ SOLN
1.0000 mg | Freq: Once | INTRAMUSCULAR | Status: AC
  Administered 2012-01-13: 1 mg via INTRAVENOUS
  Filled 2012-01-13: qty 1

## 2012-01-13 NOTE — ED Notes (Signed)
Pt had seizure in her sleep witness by husband. Lasted several minutes per husband. Confused but no remembrance of the event [facility] [employer]

## 2012-01-13 NOTE — ED Notes (Signed)
ZOX:[id number]<AV> Expected date:01/13/12<BR> Expected time:10:53 PM<BR> Means of arrival:Ambulance<BR> Comments:<BR> RM 8: Seizure, hx of same, med change recently.

## 2012-01-13 NOTE — ED Provider Notes (Signed)
History     CSN: [id number]  Arrival date & time 01/13/12  2307   First MD Initiated Contact with Patient 01/13/12 2309      Chief Complaint  Patient presents with  . Seizure     (Consider location/radiation/quality/duration/timing/severity/associated sxs/prior treatment) HPI Level 5 Caveat: post-ictal confusion. Is a 36 year old female who had a seizure in her sleep witnessed by her husband. This occurred just prior to arrival. It lasted about 15 minutes per EMS. The patient herself is confused and does not remember recent events. She is complaining of pain in her tongue. She denies headache, nausea or other injury. Complaining of pain at the IV site in her right hand. She takes Keppra for her seizures. She states she's been compliant.  Past Medical History  Diagnosis Date  . Seizures   . Narcolepsy   . Restless leg   . Depression   . Hyperlipidemia   . Allergic rhinitis   . Narcolepsy   . Diabetes mellitus without complication     Past Surgical History  Procedure Date  . Partial hysterectomy 2006  . Appendectomy 2006  . Tubal ligation 2002  . Appendectomy 2006    Family History  Problem Relation Age of Onset  . Heart attack Mother   . Diabetes Father   . Heart attack Father     History  Substance Use Topics  . Smoking status: Never Smoker   . Smokeless tobacco: Never Used  . Alcohol Use: No    OB History    Grav Para Term Preterm Abortions TAB SAB Ect Mult Living                  Review of Systems  Unable to perform ROS   Allergies  Eggs or egg-derived products; Metoclopramide hcl; Penicillins; and Sulfa antibiotics  Home Medications   Current Outpatient Rx  Name  Route  Sig  Dispense  Refill  . ARMODAFINIL 250 MG PO TABS      Take 1-2 tablets by mouth every morning.         Marland Kitchen CETIRIZINE-PSEUDOEPHEDRINE ER 5-120 MG PO TB12   Oral   Take 1 tablet by mouth daily.          Marland Kitchen CLONAZEPAM 1 MG PO TABS   Oral   Take 1 tablet (1 mg total)  by mouth 2 (two) times daily as needed for anxiety.   60 tablet   0   . DIAZEPAM 5 MG PO TABS   Oral   Take 1 tablet (5 mg total) by mouth 2 (two) times daily.   10 tablet   0   . LEVETIRACETAM 750 MG PO TABS   Oral   Take 2 tablets (1,500 mg total) by mouth every 12 (twelve) hours.         Marland Kitchen METFORMIN HCL 500 MG PO TABS   Oral   Take 1 tablet (500 mg total) by mouth 2 (two) times daily with a meal.   60 tablet   0   . NORTRIPTYLINE HCL 50 MG PO CAPS   Oral   Take 1 capsule (50 mg total) by mouth at bedtime.         . OMEPRAZOLE 20 MG PO CPDR   Oral   Take 2 capsules (40 mg total) by mouth daily.   60 capsule   5   . ONDANSETRON HCL 4 MG PO TABS   Oral   Take 1 tablet (4 mg total) by mouth every 6 (  six) hours as needed. For nausea or vomiting   20 tablet   0   . ROPINIROLE HCL 3 MG PO TABS   Oral   Take 1 tablet (3 mg total) by mouth at bedtime.   30 tablet   2   . SIMVASTATIN 10 MG PO TABS   Oral   Take 1 tablet (10 mg total) by mouth at bedtime.   30 tablet   2   . VILAZODONE HCL 10 & 20 & 40 MG PO KIT   Oral   Take 1 tablet by mouth daily.   1 kit   0     BP 133/72  Pulse 92  Temp 98.2 F (36.8 C) (Oral)  Resp 20  SpO2 96%  Physical Exam General: Well-developed, well-nourished female in no acute distress; appearance consistent with age of record HENT: normocephalic, atraumatic; bites to tongue bilaterally Eyes: pupils equal round and reactive to light; extraocular muscles intact Neck: supple Heart: regular rate and rhythm Lungs: clear to auscultation bilaterally Abdomen: soft; nondistended Extremities: No deformity; full range of motion; pulses normal Neurologic: Awake, alert, disoriented; motor function intact in all extremities and symmetric; no facial droop Skin: Warm and dry Psychiatric: Tearful    ED Course  Procedures (including critical care time)     MDM   Nursing notes and vitals signs, including pulse oximetry,  reviewed.  Summary of this visit's results, reviewed by myself:  Labs:  Results for orders placed during the hospital encounter of 01/13/12 (from the past 24 hour(s))  CBC WITH DIFFERENTIAL     Status: Abnormal   Collection Time   01/14/12  1:08 AM      Component Value Range   WBC 6.3  4.0 - 10.5 K/uL   RBC 4.44  3.87 - 5.11 MIL/uL   Hemoglobin 11.9 (*) 12.0 - 15.0 g/dL   HCT 81.1 (*) 91.4 - 78.2 %   MCV 79.7  78.0 - 100.0 fL   MCH 26.8  26.0 - 34.0 pg   MCHC 33.6  30.0 - 36.0 g/dL   RDW 95.6  21.3 - 08.6 %   Platelets 187  150 - 400 K/uL   Neutrophils Relative 70  43 - 77 %   Neutro Abs 4.4  1.7 - 7.7 K/uL   Lymphocytes Relative 24  12 - 46 %   Lymphs Abs 1.5  0.7 - 4.0 K/uL   Monocytes Relative 5  3 - 12 %   Monocytes Absolute 0.3  0.1 - 1.0 K/uL   Eosinophils Relative 1  0 - 5 %   Eosinophils Absolute 0.0  0.0 - 0.7 K/uL   Basophils Relative 0  0 - 1 %   Basophils Absolute 0.0  0.0 - 0.1 K/uL  BASIC METABOLIC PANEL     Status: Abnormal   Collection Time   01/14/12  1:08 AM      Component Value Range   Sodium 136  135 - 145 mEq/L   Potassium 3.9  3.5 - 5.1 mEq/L   Chloride 103  96 - 112 mEq/L   CO2 23  19 - 32 mEq/L   Glucose, Bld 250 (*) 70 - 99 mg/dL   BUN 12  6 - 23 mg/dL   Creatinine, Ser 5.78  0.50 - 1.10 mg/dL   Calcium 8.1 (*) 8.4 - 10.5 mg/dL   GFR calc non Af Amer >90  >90 mL/min   GFR calc Af Amer >90  >90 mL/min  URINALYSIS, ROUTINE W  REFLEX MICROSCOPIC     Status: Abnormal   Collection Time   01/14/12  1:17 AM      Component Value Range   Color, Urine YELLOW  YELLOW   APPearance CLOUDY (*) CLEAR   Specific Gravity, Urine 1.024  1.005 - 1.030   pH 5.5  5.0 - 8.0   Glucose, UA >1000 (*) NEGATIVE mg/dL   Hgb urine dipstick NEGATIVE  NEGATIVE   Bilirubin Urine NEGATIVE  NEGATIVE   Ketones, ur NEGATIVE  NEGATIVE mg/dL   Protein, ur NEGATIVE  NEGATIVE mg/dL   Urobilinogen, UA 0.2  0.0 - 1.0 mg/dL   Nitrite NEGATIVE  NEGATIVE   Leukocytes, UA NEGATIVE   NEGATIVE  URINE MICROSCOPIC-ADD ON     Status: Abnormal   Collection Time   01/14/12  1:17 AM      Component Value Range   Squamous Epithelial / LPF FEW (*) RARE   WBC, UA 0-2  <3 WBC/hpf   Bacteria, UA FEW (*) RARE     12:31 AM Patient had brief seizure witnessed by family. Patient is now very postictal. An additional 2 mg of Ativan IV were given. Family states the patient was restarted on nortriptyline this week.  3:14 AM Somnolent but arousable. Patient has had no additional seizures. Patient and family were strongly advised to discontinue nortriptyline.     Hanley Seamen, MD 01/14/12 [phone]

## 2012-01-14 ENCOUNTER — Telehealth: Payer: Self-pay | Admitting: Family

## 2012-01-14 LAB — CBC WITH DIFFERENTIAL/PLATELET
Basophils Absolute: 0 10*3/uL (ref 0.0–0.1)
Basophils Relative: 0 % (ref 0–1)
Eosinophils Absolute: 0 10*3/uL (ref 0.0–0.7)
Lymphs Abs: 1.5 10*3/uL (ref 0.7–4.0)
MCH: 26.8 pg (ref 26.0–34.0)
MCHC: 33.6 g/dL (ref 30.0–36.0)
Neutrophils Relative %: 70 % (ref 43–77)
Platelets: 187 10*3/uL (ref 150–400)
RBC: 4.44 MIL/uL (ref 3.87–5.11)
RDW: 14.2 % (ref 11.5–15.5)

## 2012-01-14 LAB — BASIC METABOLIC PANEL
Calcium: 8.1 mg/dL — ABNORMAL LOW (ref 8.4–10.5)
GFR calc Af Amer: >90 mL/min (ref >90)
GFR calc non Af Amer: >90 mL/min (ref >90)
Potassium: 3.9 mEq/L (ref 3.5–5.1)
Sodium: 136 mEq/L (ref 135–145)

## 2012-01-14 LAB — URINE MICROSCOPIC-ADD ON

## 2012-01-14 LAB — URINALYSIS, ROUTINE W REFLEX MICROSCOPIC
Nitrite: NEGATIVE
Protein, ur: NEGATIVE mg/dL
Specific Gravity, Urine: 1.024 (ref 1.005–1.030)
Urobilinogen, UA: 0.2 mg/dL (ref 0.0–1.0)

## 2012-01-14 MED ORDER — LORAZEPAM 2 MG/ML IJ SOLN
2.0000 mg | Freq: Once | INTRAMUSCULAR | Status: AC
  Administered 2012-01-14: 2 mg via INTRAVENOUS

## 2012-01-14 MED ORDER — LORAZEPAM 2 MG/ML IJ SOLN
INTRAMUSCULAR | Status: AC
  Administered 2012-01-14: 2 mg via INTRAVENOUS
  Filled 2012-01-14: qty 1

## 2012-01-14 NOTE — ED Notes (Signed)
Pt had a seizure witnessed by family at bedside. Seizure lasting a matter of seconds, md aware and medication ordered. Pt currently with minimal verbal responses. General pain.  [facility], [employer]

## 2012-01-14 NOTE — Telephone Encounter (Signed)
Patient scheduled an appointment for 01/18/12

## 2012-01-14 NOTE — Telephone Encounter (Signed)
Pls call pt and arrange a 1 week post ED follow up visit.

## 2012-01-18 ENCOUNTER — Ambulatory Visit (INDEPENDENT_AMBULATORY_CARE_PROVIDER_SITE_OTHER): Payer: Self-pay | Admitting: Family

## 2012-01-18 ENCOUNTER — Telehealth: Payer: Self-pay | Admitting: *Deleted

## 2012-01-18 ENCOUNTER — Encounter: Payer: Self-pay | Admitting: Family

## 2012-01-18 VITALS — BP 110/80 | HR 75 | Temp 98.6°F | Resp 16 | Ht 65.0 in | Wt 197.0 lb

## 2012-01-18 DIAGNOSIS — R7309 Other abnormal glucose: Secondary | ICD-10-CM

## 2012-01-18 DIAGNOSIS — F329 Major depressive disorder, single episode, unspecified: Secondary | ICD-10-CM

## 2012-01-18 DIAGNOSIS — G40909 Epilepsy, unspecified, not intractable, without status epilepticus: Secondary | ICD-10-CM

## 2012-01-18 DIAGNOSIS — R739 Hyperglycemia, unspecified: Secondary | ICD-10-CM | POA: Insufficient documentation

## 2012-01-18 DIAGNOSIS — F341 Dysthymic disorder: Secondary | ICD-10-CM

## 2012-01-18 DIAGNOSIS — R569 Unspecified convulsions: Secondary | ICD-10-CM

## 2012-01-18 LAB — HEMOGLOBIN A1C: Mean Plasma Glucose: 126 mg/dL — ABNORMAL HIGH (ref <117)

## 2012-01-18 MED ORDER — ZOLPIDEM TARTRATE 5 MG PO TABS
5.0000 mg | ORAL_TABLET | Freq: Every evening | ORAL | Status: DC | PRN
Start: 2012-01-18 — End: 2012-05-15

## 2012-01-18 NOTE — Assessment & Plan Note (Signed)
On Metformin, repeat A1C.

## 2012-01-18 NOTE — Telephone Encounter (Signed)
Pt states she forgot to talk with you about headaches that she has been having. Reports headache every day for the last 1 1/2 weeks. Had some old hydrocodone from the past and she took that yesterday and it seemed to help.  Headache is concentrated in the forehead / hair line section.  Pt states you mentioned today that she may need to stay out of work. Pt would like a copy of the paperwork that we previously sent to her employer for leave of abscence. She requests copy of office note from today. Also needs letter stating reason for being out of work and when or if she may be able to return to work. Also need to note that we are referring her to specialists and the type of specialists (rheumatology, psychiatrist, neurologist). Would like [place] to state that she will be out indefinitely until she is seen by specialists and becomes stable under their care.  Please advise.

## 2012-01-18 NOTE — Progress Notes (Signed)
Subjective:    Patient ID: [name], female    DOB: 11-02-1975, 36 y.o.   MRN: [id number]  HPI  Depression- she has not yet been back to meet with Dr. Dellia Cloud. She plans to make an appointment. Tolerating Viibryd. Has only been on 40mg  therapeutic dose for 4 days.  So far no improvement in her depression. She denies suicide ideation. She is not currently following with psychiatry.    Seizures-  She saw Dr. 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  She subsequently suffered a seizure in her sleep and was brought to the ED.  Nortriptyline was discontinued. She continues Keppra.  No seizures since returning home.  Hyperglycemia- She is on metformin, but notes that she continues to have sugars in the high 100's to low 200's.  Review of Systems See HPI  Past Medical History  Diagnosis Date  . Seizures   . Narcolepsy   . Restless leg   . Depression   . Hyperlipidemia   . Allergic rhinitis   . Narcolepsy   . Diabetes mellitus without complication     History   Social History  . Marital Status: Married    Spouse Name: N/A    Number of Children: 2  . Years of Education: N/A   Occupational History  . Unemployed- full time student     Completed 12th grade   Social History Main Topics  . Smoking status: Never Smoker   . Smokeless tobacco: Never Used  . Alcohol Use: No  . Drug Use: No  . Sexually Active: Not on file   Other Topics Concern  . Not on file   Social History Narrative  . No narrative on file    Past Surgical History  Procedure Date  . Partial hysterectomy 2006  . Appendectomy 2006  . Tubal ligation 2002  . Appendectomy 2006    Family History  Problem Relation Age of Onset  . Heart attack Mother   . Diabetes Father   . Heart attack Father     Allergies  Allergen Reactions  . Eggs Or Egg-Derived Products Nausea And Vomiting  . Metoclopramide Hcl Other (See Comments)    Intensifies restless leg  . Penicillins Itching   . Sulfa Antibiotics     Tongue swelling    Current Outpatient Prescriptions on File Prior to Visit  Medication Sig Dispense Refill  . Armodafinil (NUVIGIL) 250 MG tablet Take 1-2 tablets by mouth every morning.      . cetirizine-pseudoephedrine (ZYRTEC-D) 5-120 MG per tablet Take 1 tablet by mouth daily.       . clonazePAM (KLONOPIN) 1 MG tablet Take 1 tablet (1 mg total) by mouth 2 (two) times daily as needed for anxiety.  60 tablet  0  . diazepam (VALIUM) 5 MG tablet Take 5 mg by mouth every 12 (twelve) hours as needed. For seizure disorder      . levETIRAcetam (KEPPRA) 750 MG tablet Take 2 tablets (1,500 mg total) by mouth every 12 (twelve) hours.      . metFORMIN (GLUCOPHAGE) 500 MG tablet Take 1 tablet (500 mg total) by mouth 2 (two) times daily with a meal.  60 tablet  0  . omeprazole (PRILOSEC) 20 MG capsule Take 2 capsules (40 mg total) by mouth daily.  60 capsule  5  . ondansetron (ZOFRAN) 4 MG tablet Take 1 tablet (4 mg total) by mouth every 6 (six) hours as needed. For nausea or vomiting  20  tablet  0  . rOPINIRole (REQUIP) 3 MG tablet Take 1 tablet (3 mg total) by mouth at bedtime.  30 tablet  2  . zolpidem (AMBIEN) 5 MG tablet Take 1 tablet (5 mg total) by mouth at bedtime as needed for sleep.  20 tablet  0  . [DISCONTINUED] desvenlafaxine (PRISTIQ) 100 MG 24 hr tablet Take 100 mg by mouth daily.        . [DISCONTINUED] gabapentin (NEURONTIN) 300 MG capsule Take 300 mg by mouth 4 (four) times daily.          BP 110/80  Pulse 75  Temp 98.6 F (37 C) (Oral)  Resp 16  Ht 5\' 5"  (1.651 m)  Wt 197 lb 0.6 oz (89.377 kg)  BMI 32.79 kg/m2  SpO2 99%       Objective:   Physical Exam  Constitutional: She appears well-developed and well-nourished. No distress.  Psychiatric: Her behavior is normal. Judgment and thought content normal.       Flat affect          Assessment & Plan:

## 2012-01-18 NOTE — Patient Instructions (Addendum)
Please follow up in 1 month. You will be contacted about your referral to neuro and psychiatry.  Please let us know if you have not heard back within 1 week about your referral.

## 2012-01-18 NOTE — Assessment & Plan Note (Signed)
Continue Keppra. Was following with Dr. Modesto Charon, will refer her back to neuro (he has left the practice).  Obtain Keppra level.    45 minutes spent with the patient today.  > 50% of this time was spent counseling pt on her seizures and depression.

## 2012-01-18 NOTE — Assessment & Plan Note (Signed)
No significant improvement but she only just started therapeutic dose.  Will refer to psychiatry. Pt to follow up with Dr. Dellia Cloud.

## 2012-01-21 NOTE — Telephone Encounter (Signed)
Left message requesting that pt return my call.

## 2012-01-22 ENCOUNTER — Encounter: Payer: Self-pay | Admitting: Family

## 2012-01-22 NOTE — Telephone Encounter (Signed)
Called pt.  She reports that she is scheduled for an appointment with with Rheumatology for 1/7.  She is also scheduled to have neuro appointment tomorrow which she needs to reschedule due to her disability hearing.  She has not scheduled psych apt yet- "lost the number."  I have asked Myriam Jacobson to call her back with the number.  We discussed that at this point, her uncontrolled seizures and her depression are her biggest barriers to returning to work.  We discussed that both of these situations can hopefully be improved by medication management under a specialist's care.  I encouraged her to follow up with Neurology, keep upcoming rheumatology appointment and schedule appointment with psychiatry as soon as possible.  She verbalizes understanding. She is requesting note for work and disability re: above.  Will leave at front desk.

## 2012-01-23 ENCOUNTER — Ambulatory Visit: Payer: Self-pay | Admitting: Neurology

## 2012-01-25 ENCOUNTER — Ambulatory Visit: Payer: Self-pay | Admitting: Neurology

## 2012-02-01 ENCOUNTER — Ambulatory Visit (INDEPENDENT_AMBULATORY_CARE_PROVIDER_SITE_OTHER): Payer: Self-pay | Admitting: Neurology

## 2012-02-01 ENCOUNTER — Encounter: Payer: Self-pay | Admitting: Neurology

## 2012-02-01 VITALS — BP 110/68 | HR 78 | Temp 98.0°F | Resp 16 | Ht 66.0 in | Wt 197.0 lb

## 2012-02-01 DIAGNOSIS — G40309 Generalized idiopathic epilepsy and epileptic syndromes, not intractable, without status epilepticus: Secondary | ICD-10-CM

## 2012-02-01 DIAGNOSIS — G47419 Narcolepsy without cataplexy: Secondary | ICD-10-CM

## 2012-02-01 MED ORDER — LEVETIRACETAM 750 MG PO TABS: 1500.0000 mg | ORAL_TABLET | Freq: Two times a day (BID) | ORAL | Status: DC

## 2012-02-01 NOTE — Progress Notes (Signed)
[name] is a 36 year old woman with a history of generalized seizure disorder. She states that the first "full-blown" seizure occurred in 2009.  She does not recall the events, nor does she remember having a warning beforehand. She tries to avoid driving when possible. She has had an EEG which shows polyspike waveforms consistent with a generalized epilepsy syndrome. She has one niece with epilepsy but no first degree relatives with that diagnosis. She has had approximately 3 or 4 generalized seizures since 2009. On December 9, she was admitted for a 7 minute witnessed generalized seizure. Her husband witnessed this. She may have had one or 2 staring spells or partial seizures in the hospital or soon after discharge following this event. She has been taking Keppra for most of the past several years. Her current dosages there Keppra 750 mg, 2 tablets at night. In the record, it states that she is prescribed 2 tablets twice a day, but she was not informed of this. She had tried phenobarbital initially because of its low extensor profile she also took Depakote in 2009 but did not tolerate this well.  She has recently developed type 2 diabetes. She was also taken out of work in October for problems with not feeling well and having depression and anxiety and she was to have neurology and psychiatry consultations to see what was going on. She is scheduled to return to work at the end of January but she is not sure that she will be ready.  Since her last admission for seizures, her clonazepam dosage has been increased to 1 mg twice a day. She is taking this. She has had 2 or 3 possible episodes of seizure activity in the night, but her husband can't always distinguish restless legs from bad dreams from seizures. There have been no more tongue biting episodes in the night.  The patient has a diagnosis of narcolepsy that was made after falling asleep in the daytime. She was testing though for neurology. She also has the  results at home and she'll bring them with her at her next visit. When she is working she uses new visual 250 mg once a day which she finds helpful.  Past Medical History  Diagnosis Date  . Seizures   . Narcolepsy   . Restless leg   . Depression   . Hyperlipidemia   . Allergic rhinitis   . Narcolepsy   . Diabetes mellitus without complication     Current Outpatient Prescriptions on File Prior to Visit  Medication Sig Dispense Refill  . Armodafinil (NUVIGIL) 250 MG tablet Take 1-2 tablets by mouth every morning.      . cetirizine-pseudoephedrine (ZYRTEC-D) 5-120 MG per tablet Take 1 tablet by mouth daily.       . clonazePAM (KLONOPIN) 1 MG tablet Take 1 tablet (1 mg total) by mouth 2 (two) times daily as needed for anxiety.  60 tablet  0  . levETIRAcetam (KEPPRA) 750 MG tablet Take 2 tablets (1,500 mg total) by mouth every 12 (twelve) hours.  120 tablet  12  . metFORMIN (GLUCOPHAGE) 500 MG tablet Take 1 tablet (500 mg total) by mouth 2 (two) times daily with a meal.  60 tablet  0  . omeprazole (PRILOSEC) 20 MG capsule Take 2 capsules (40 mg total) by mouth daily.  60 capsule  5  . ondansetron (ZOFRAN) 4 MG tablet Take 1 tablet (4 mg total) by mouth every 6 (six) hours as needed. For nausea or vomiting  20 tablet  0  . rOPINIRole (REQUIP) 3 MG tablet Take 1 tablet (3 mg total) by mouth at bedtime.  30 tablet  2  . zolpidem (AMBIEN) 5 MG tablet Take 1 tablet (5 mg total) by mouth at bedtime as needed for sleep.  20 tablet  0  . diazepam (VALIUM) 5 MG tablet Take 5 mg by mouth every 12 (twelve) hours as needed. For seizure disorder      . [DISCONTINUED] desvenlafaxine (PRISTIQ) 100 MG 24 hr tablet Take 100 mg by mouth daily.        . [DISCONTINUED] gabapentin (NEURONTIN) 300 MG capsule Take 300 mg by mouth 4 (four) times daily.          Family History  Problem Relation Age of Onset  . Heart attack Mother   . Diabetes Father   . Heart attack Father     History   Social History  .  Marital Status: Married    Spouse Name: N/A    Number of Children: 2  . Years of Education: N/A   Occupational History  . Unemployed- full time student     Completed 12th grade   Social History Main Topics  . Smoking status: Never Smoker   . Smokeless tobacco: Never Used  . Alcohol Use: No     Comment: no  . Drug Use: No  . Sexually Active: Not on file   Other Topics Concern  . Not on file   Social History Narrative  . No narrative on file   Eggs or egg-derived products; Metoclopramide hcl; Penicillins; and Sulfa antibiotics  Alert and oriented x 3.  Memory function appears to be intact.  Concentration and attention are normal for educational level and background.  Speech is fluent and without significant word finding difficulty.  Is aware of current events.  No carotid bruits detected.  Cranial nerve II through XII are within normal limits.  This includes normal optic discs and acuity, EOMI, PERLA, facial movement and sensation intact, hearing grossly intact, gag intact,Uvula raises symmetrically and tongue protrudes evenly.  Tongue has prominent teeth marks and small horizontal cracks on the upper surface. Motor strength is 5 over 5 throughout all limbs. No atrophy, increased tone or tremors. Reflexes are 3+ and symmetric in the upper and lower extremities Sensory exam is intact. Coordination is intact fo rfine movements and rapid alternating movements in all limbs Gait and station are normal.  She can hop on either foot.  Impression: 1. History of generalized epilepsy syndrome with positive EEG test.  She feels that the Keppra had been helping until recently. She is currently taking 1500 mg at night only as well as clonazepam 1 mg twice a day for multiple purposes. 2. History of narcolepsy and she takes nuvigil was at work. This is an appropriate medication.  It would be best if she could avoid stimulant medications such as Adderall, given the epilepsy condition.  Plan: 1.  She will begin taking Keppra 750 mg in the morning +1500 mg at night for one week. 2. She will then increase Keppra to 1500 mg morning and night, as tolerated. 3.  If her seizure control is improved with higher dose of Keppra and she does not have significant side effects, we will continue this course. 4.  If her seizures are not better control with Keppra alone, we will consider switching or adding medication as indicated. 5.  Return in one month to see how she is doing with the Keppra increase.

## 2012-02-01 NOTE — Patient Instructions (Addendum)
Follow up in one month with Dr. Smiley Houseman.

## 2012-02-04 ENCOUNTER — Telehealth: Payer: Self-pay | Admitting: Family

## 2012-02-04 NOTE — Telephone Encounter (Signed)
Patient called in stating that [facility] does not participate with [facility] 100% discount program and she does not have any insurance as of yet. She wanted to let [name] know that she did not schedule an appointment because she did not want to pay out of pocket

## 2012-02-05 NOTE — Telephone Encounter (Addendum)
[name], could you please see if any of the psychiatrists at behavioral health will accept her [facility] insurance and if so, please help to arrange consult for depression and anxiety?

## 2012-02-07 ENCOUNTER — Telehealth: Payer: Self-pay | Admitting: *Deleted

## 2012-02-07 ENCOUNTER — Telehealth: Payer: Self-pay | Admitting: Neurology

## 2012-02-07 MED ORDER — VILAZODONE HCL 40 MG PO TABS: 40.0000 mg | ORAL_TABLET | Freq: Every day | ORAL | Status: DC

## 2012-02-07 NOTE — Telephone Encounter (Signed)
Called and spoke with the patient. She wants to know if it is ok to restart her Nuvigil as she feels the increase in the Keppra is making her sleepier. She has the medication but wants to know if she can restart and if so should she take one or two (30 or 60 mg). She is aware Dr. Smiley Houseman out of the office until tomorrow morning and is ok to wait. She also wants me to reorder her Keppra through patient assistance and asked that I go online to "[facility]" to reorder. I told her I would work on that. **Dr. Smiley Houseman, please advise the Nuvigil and dosing. Thank you.

## 2012-02-07 NOTE — Telephone Encounter (Signed)
Pt assistance forms for Viibryd completed and forwarded to Provider along with Rx for signature. If pt qualifies, [facility] will ship 90 day supply. New application and Rx has to be submitted every 3 months. Contacted pt, she will come by the office Friday, 02/08/12 to complete paperwork.

## 2012-02-07 NOTE — Telephone Encounter (Signed)
Message copied by Kathi Simpers on Thu Feb 07, 2012 11:48 AM ------      Message from: O'SULLIVAN, [name]      Created: Fri Jan 18, 2012  1:16 PM       Could we pls initiate pt assist for viibryd for her? I gave her samples

## 2012-02-07 NOTE — Telephone Encounter (Signed)
The patient's husband called to ask if the patient should restart her narcolepsy medication since the increase in epilepsy medication is causing her to be very drowsy.  The patient also wanted our office to know that she receives her Keppra from [facility] (patient financial assistance).  Please call the patient at [phone].

## 2012-02-08 ENCOUNTER — Telehealth: Payer: Self-pay | Admitting: Family

## 2012-02-08 ENCOUNTER — Telehealth: Payer: Self-pay | Admitting: Neurology

## 2012-02-08 ENCOUNTER — Other Ambulatory Visit: Payer: Self-pay | Admitting: Family

## 2012-02-08 MED ORDER — METFORMIN HCL 500 MG PO TABS: 500.0000 mg | ORAL_TABLET | Freq: Two times a day (BID) | ORAL | Status: DC

## 2012-02-08 MED ORDER — CLONAZEPAM 1 MG PO TABS: 1.0000 mg | ORAL_TABLET | Freq: Two times a day (BID) | ORAL | Status: DC | PRN

## 2012-02-08 NOTE — Telephone Encounter (Signed)
Spoke with pt, she states she will complete paperwork on Monday, 02/11/12.

## 2012-02-08 NOTE — Telephone Encounter (Signed)
Called patient this AM.  She was told by PMD or staff to hold nuvigil while switching from cymbalta to viibryd.  I never asked her to stop nuvigil.  It seems that she should be able to continue nuvigil as she is having trouble functioning normally.  She will check with PMD about her current difficulties with sleepiness

## 2012-02-08 NOTE — Telephone Encounter (Signed)
Refill- clonazepam 1mg  tablet. Qty 60 last fill 12.3.13

## 2012-02-08 NOTE — Telephone Encounter (Signed)
Pt requesting refill of Zofran. Rx last sent on 11/28/11 #20. Please advise re: quantity and additional refills.

## 2012-02-08 NOTE — Telephone Encounter (Signed)
Received message from pt also requesting refill of metformin to go to [facility]. Refills called to [facility]. #60 x no refills of clonazepam and #60 x 2 refills of metformin.

## 2012-02-08 NOTE — Telephone Encounter (Signed)
Calling to obtain information for the patient assistance process for a refill for the patient's Keppra. Recording received: "office closed due to inclement weather".  Left a message asking that they call me back with information as to how to request a refill for the patient's Keppra.

## 2012-02-11 ENCOUNTER — Telehealth: Payer: Self-pay | Admitting: Neurology

## 2012-02-11 NOTE — Telephone Encounter (Signed)
OK to send #30 with 1 refill. 

## 2012-02-11 NOTE — Telephone Encounter (Signed)
Called [employer] Patient Assistance and spoke with Byrd Hesselbach. She had this patient in the system but the patient was to apply again last July and did not. According to [facility], the patient will need to reapply and then we need to fax her information along with the script to them. A six month supply will be shipped to the office if approved. Faxing necessary forms to me.

## 2012-02-11 NOTE — Telephone Encounter (Signed)
Refill sent.

## 2012-02-11 NOTE — Telephone Encounter (Signed)
Spoke with the patient. Aware that we can fax, mail the form but not email as it isn't secure. She will come in tomorrow to fill out and make a f/u appointment.

## 2012-02-11 NOTE — Telephone Encounter (Signed)
Spoke with the patient. Aware that she needs to reapply for patient assistance an that I have the form that needs t be filled out. Is aware of what she needs to include as stated on the form. Will come by today or tomorrow to complete the form.

## 2012-02-13 NOTE — Telephone Encounter (Signed)
Received a staff message from T. McCoy: "[name] called and said could you just mail the Keppra info to her. She has been sick and can't get out." Form to go out in today's mail.

## 2012-02-18 ENCOUNTER — Ambulatory Visit: Payer: Self-pay | Admitting: Family

## 2012-02-20 ENCOUNTER — Ambulatory Visit (INDEPENDENT_AMBULATORY_CARE_PROVIDER_SITE_OTHER): Payer: Self-pay | Admitting: Family

## 2012-02-20 ENCOUNTER — Encounter: Payer: Self-pay | Admitting: Family

## 2012-02-20 VITALS — BP 114/80 | HR 82 | Temp 98.4°F | Resp 16 | Ht 65.0 in | Wt 199.1 lb

## 2012-02-20 DIAGNOSIS — F329 Major depressive disorder, single episode, unspecified: Secondary | ICD-10-CM

## 2012-02-20 NOTE — Patient Instructions (Addendum)
Call to make an appointment with psychiatry with Dr. Katharina Caper.  Follow up on 1/24. Continue Viibryd.

## 2012-02-20 NOTE — Assessment & Plan Note (Signed)
Somewhat improved at this point on Viibryd. She has a lot of stress at home.  Husband was recently admitted to hospital for depression/SI.  I have given her number for psychiatrist to call and arrange appointment.  I have also asked her to arrange follow up with Dr. Dellia Cloud for counseling.  She is scheduled to return to work on 1/27 which I think is reasonable.  I have asked her to follow back up with [place] on 1/24 for clearance to return to work.  Continue viibryd.  We are working on pt assistance for the medication.  15 minutes spent with pt today.  >50% of this time was spent counseling pt on her depression.

## 2012-02-20 NOTE — Progress Notes (Signed)
Subjective:    Patient ID: [name], female    DOB: 1975-08-12, 37 y.o.   MRN: [id number]  HPI  [name] is a 37 yr old female who presents today for follow up.  Depression- she continue the viibryd.  Not working "quite as well." having a lot of stress with husband. Having trouble falling asleep.  Could not afford to buy ambien.  Plans to set up apt with Dr. Dellia Cloud. She has not arranged psychiatric appointment.    She tells me that she was unable to keep rheumatology apt as they required 200$ up front and she does not have the money.    Review of Systems See HPI  Past Medical History  Diagnosis Date  . Seizures   . Narcolepsy   . Restless leg   . Depression   . Hyperlipidemia   . Allergic rhinitis   . Narcolepsy   . Diabetes mellitus without complication     History   Social History  . Marital Status: Married    Spouse Name: N/A    Number of Children: 2  . Years of Education: N/A   Occupational History  . Unemployed- full time student     Completed 12th grade   Social History Main Topics  . Smoking status: Never Smoker   . Smokeless tobacco: Never Used  . Alcohol Use: No     Comment: no  . Drug Use: No  . Sexually Active: Not on file   Other Topics Concern  . Not on file   Social History Narrative  . No narrative on file    Past Surgical History  Procedure Date  . Partial hysterectomy 2006  . Appendectomy 2006  . Tubal ligation 2002  . Appendectomy 2006    Family History  Problem Relation Age of Onset  . Heart attack Mother   . Diabetes Father   . Heart attack Father     Allergies  Allergen Reactions  . Eggs Or Egg-Derived Products Nausea And Vomiting  . Metoclopramide Hcl Other (See Comments)    Intensifies restless leg  . Penicillins Itching  . Sulfa Antibiotics     Tongue swelling    Current Outpatient Prescriptions on File Prior to Visit  Medication Sig Dispense Refill  . Armodafinil (NUVIGIL) 250 MG tablet Take 1-2  tablets by mouth every morning.      . cetirizine-pseudoephedrine (ZYRTEC-D) 5-120 MG per tablet Take 1 tablet by mouth daily.       . clonazePAM (KLONOPIN) 1 MG tablet Take 1 tablet (1 mg total) by mouth 2 (two) times daily as needed for anxiety.  60 tablet  0  . levETIRAcetam (KEPPRA) 750 MG tablet Take 2 tablets (1,500 mg total) by mouth every 12 (twelve) hours.  120 tablet  12  . metFORMIN (GLUCOPHAGE) 500 MG tablet Take 1 tablet (500 mg total) by mouth 2 (two) times daily with a meal.  60 tablet  2  . omeprazole (PRILOSEC) 20 MG capsule Take 2 capsules (40 mg total) by mouth daily.  60 capsule  5  . ondansetron (ZOFRAN) 4 MG tablet TAKE ONE TABLET BY MOUTH EVERY 6 HOURS AS NEEDED FOR NAUSEA/VOMITING  30 tablet  1  . rOPINIRole (REQUIP) 3 MG tablet Take 1 tablet (3 mg total) by mouth at bedtime.  30 tablet  2  . Vilazodone HCl (VIIBRYD) 40 MG TABS Take 1 tablet (40 mg total) by mouth daily.  90 tablet  0  . zolpidem (AMBIEN) 5 MG  tablet Take 1 tablet (5 mg total) by mouth at bedtime as needed for sleep.  20 tablet  0  . [DISCONTINUED] desvenlafaxine (PRISTIQ) 100 MG 24 hr tablet Take 100 mg by mouth daily.        . [DISCONTINUED] gabapentin (NEURONTIN) 300 MG capsule Take 300 mg by mouth 4 (four) times daily.          BP 114/80  Pulse 82  Temp 98.4 F (36.9 C) (Oral)  Resp 16  Ht 5\' 5"  (1.651 m)  Wt 199 lb 1.9 oz (90.32 kg)  BMI 33.14 kg/m2  SpO2 98%        Objective:   Physical Exam  Constitutional: She appears well-developed and well-nourished. No distress.  Skin: Skin is warm and dry.  Psychiatric: Her behavior is normal. Judgment and thought content normal.       Slightly flat affect          Assessment & Plan:

## 2012-02-25 ENCOUNTER — Ambulatory Visit: Payer: Self-pay | Admitting: Family

## 2012-02-25 NOTE — Telephone Encounter (Signed)
Rx and application faxed to [phone] Attn: Pt Assist Program with [facility]. Awaiting approval / denial status.

## 2012-02-26 ENCOUNTER — Ambulatory Visit: Payer: Self-pay | Admitting: Family

## 2012-02-27 NOTE — Telephone Encounter (Signed)
Called and spoke with the patient re: form for patient assistance for Keppra. She states she will bring it in on Friday as she has an appointment with Dr. Smiley Houseman. No other issues voiced at this time.

## 2012-02-29 ENCOUNTER — Encounter: Payer: Self-pay | Admitting: Family

## 2012-02-29 ENCOUNTER — Ambulatory Visit (INDEPENDENT_AMBULATORY_CARE_PROVIDER_SITE_OTHER): Payer: Self-pay | Admitting: Family

## 2012-02-29 ENCOUNTER — Encounter: Payer: Self-pay | Admitting: Neurology

## 2012-02-29 ENCOUNTER — Ambulatory Visit (INDEPENDENT_AMBULATORY_CARE_PROVIDER_SITE_OTHER): Payer: Self-pay | Admitting: Neurology

## 2012-02-29 VITALS — BP 120/82 | HR 77 | Temp 98.4°F | Resp 16 | Ht 65.0 in | Wt 201.0 lb

## 2012-02-29 VITALS — BP 110/70 | HR 72 | Temp 97.9°F | Resp 16 | Wt 200.0 lb

## 2012-02-29 DIAGNOSIS — F32A Depression, unspecified: Secondary | ICD-10-CM

## 2012-02-29 DIAGNOSIS — G40309 Generalized idiopathic epilepsy and epileptic syndromes, not intractable, without status epilepticus: Secondary | ICD-10-CM

## 2012-02-29 DIAGNOSIS — F329 Major depressive disorder, single episode, unspecified: Secondary | ICD-10-CM

## 2012-02-29 DIAGNOSIS — Z23 Encounter for immunization: Secondary | ICD-10-CM

## 2012-02-29 MED ORDER — LEVETIRACETAM ER 750 MG PO TB24: 1500.0000 mg | ORAL_TABLET | Freq: Two times a day (BID) | ORAL | Status: DC

## 2012-02-29 MED ORDER — LEVETIRACETAM 750 MG PO TABS: 1500.0000 mg | ORAL_TABLET | Freq: Two times a day (BID) | ORAL | Status: DC

## 2012-02-29 NOTE — Progress Notes (Signed)
Returns for one-month followup of her history of generalized epilepsy. She initially took phenobarbital and was later switched to Keppra XR. Then at some point she was switched to generic Keppra, not the X R. Form. Although she was prescribed 1500 mg b.i.d., she understood the directions to be 750 b.i.d. She was not completely controlled. She was seen here 4 weeks ago and she was increased to 1500 b.i.d. However, she states that she has had 3 seizures since that time, most recently last Tuesday while playing a video game. The other 2 occurred while asleep. They included tongue biting and jerking and loss of consciousness but no incontinence.  She feels that she was better controlled on the XR form. She was actually taking it t.i.d. At that time, 750 mg per dose. She has a [employer] patient assistance form today, but is down to her last pill on generic Keppra.a  Review symptoms is positive for sleep difficulty, stomach upset with greasy foods, depression and sometimes urinary frequency. ROS is otherwise negative.  Past Medical History  Diagnosis Date  . Seizures   . Narcolepsy   . Restless leg   . Depression   . Hyperlipidemia   . Allergic rhinitis   . Narcolepsy   . Diabetes mellitus without complication     Current Outpatient Prescriptions on File Prior to Visit  Medication Sig Dispense Refill  . Armodafinil (NUVIGIL) 250 MG tablet Take 1-2 tablets by mouth every morning.      . cetirizine-pseudoephedrine (ZYRTEC-D) 5-120 MG per tablet Take 1 tablet by mouth daily.       . clonazePAM (KLONOPIN) 1 MG tablet Take 1 tablet (1 mg total) by mouth 2 (two) times daily as needed for anxiety.  60 tablet  0  . metFORMIN (GLUCOPHAGE) 500 MG tablet Take 1 tablet (500 mg total) by mouth 2 (two) times daily with a meal.  60 tablet  2  . omeprazole (PRILOSEC) 20 MG capsule Take 2 capsules (40 mg total) by mouth daily.  60 capsule  5  . ondansetron (ZOFRAN) 4 MG tablet TAKE ONE TABLET BY MOUTH EVERY 6 HOURS AS  NEEDED FOR NAUSEA/VOMITING  30 tablet  1  . rOPINIRole (REQUIP) 3 MG tablet Take 1 tablet (3 mg total) by mouth at bedtime.  30 tablet  2  . Vilazodone HCl (VIIBRYD) 40 MG TABS Take 1 tablet (40 mg total) by mouth daily.  90 tablet  0  . zolpidem (AMBIEN) 5 MG tablet Take 1 tablet (5 mg total) by mouth at bedtime as needed for sleep.  20 tablet  0  . Levetiracetam (KEPPRA XR) 750 MG TB24 Take 2 tablets (1,500 mg total) by mouth 2 (two) times daily.  120 tablet  12  . [DISCONTINUED] desvenlafaxine (PRISTIQ) 100 MG 24 hr tablet Take 100 mg by mouth daily.        . [DISCONTINUED] gabapentin (NEURONTIN) 300 MG capsule Take 300 mg by mouth 4 (four) times daily.          History   Social History  . Marital Status: Married    Spouse Name: N/A    Number of Children: 2  . Years of Education: N/A   Occupational History  . Unemployed- full time student     Completed 12th grade   Social History Main Topics  . Smoking status: Never Smoker   . Smokeless tobacco: Never Used  . Alcohol Use: No     Comment: no  . Drug Use: No  . Sexually  Active: Not on file   Other Topics Concern  . Not on file   Social History Narrative  . No narrative on file    Family History  Problem Relation Age of Onset  . Heart attack Mother   . Diabetes Father   . Heart attack Father    BP 110/70  Pulse 72  Temp 97.9 F (36.6 C)  Resp 16  Wt 200 lb (90.719 kg) a  Alert and oriented x 3.  Memory function appears to be intact.  Concentration and attention are normal for educational level and background.  Speech is fluent and without significant word finding difficulty.  Is aware of current events.  No carotid bruits detected.  Cranial nerve II through XII are within normal limits.  This includes normal optic discs and acuity, EOMI, PERLA, facial movement and sensation intact, hearing grossly intact, gag intact,Uvula raises symmetrically and tongue protrudes evenly. Motor strength is 5 over 5 throughout all  limbs.  No atrophy, abnormal tone or tremors. Reflexes are 2+ and symmetric in the upper and lower extremities Sensory exam is intact. Coordination is intact for fine movements and rapid alternating movements in all limbs Gait and station are normal.   There are teeth marks on the tongue but no obvious lacerations.  Impression: 1. Generalized epilepsy history. At this time based on history, she is not well controlled on the generic Keppra. They suggested she is better controlled on the long-acting form of membrane Keppra. 2. History of narcolepsy, no change in status 3. Restless leg syndrome on Requip.  Plan: 1. We will prescribe Keppra 750 XR, 2 pills b.i.d. And send in an assistance program form 2. In the meantime, we will try to see if they can afford the generic Keppra, or call the epilepsy information service and see if they have any medications that can be distributed for a short term to tide him over. 3. Return in 2 months to see if the XR Keppra is effective

## 2012-02-29 NOTE — Assessment & Plan Note (Signed)
Uncontrolled. Continue follow up as scheduled with Dr. Smiley Houseman.  Continue keppra. At this time, I have advised her to remain out of work for 2 more weeks. We will re-evaluate at that time.

## 2012-02-29 NOTE — Patient Instructions (Addendum)
Please follow up 2 weeks

## 2012-02-29 NOTE — Assessment & Plan Note (Signed)
Ongoing, likely multifactorial. She is unable to afford to see rheumatology. She is working on trying to apply for disability.  If this goes through, then hopefully she will be able to obtain insurance and we can proceed with further work up.

## 2012-02-29 NOTE — Progress Notes (Signed)
Subjective:    Patient ID: [name], female    DOB: Aug 10, 1975, 37 y.o.   MRN: [id number]  HPI  [name] is a 37 yr old female who presents today for follow up.  Depression- She has 2 appointments with Dr. Dellia Cloud next week. She has not heard back from Dr. Kathi Der office.  Reports feeling depressed about everything going on.  She continues viibryd and tells me that she has sent pt assistance paperwork in.  She denies suicide ideation. She reports no energy. Sleeping is on and off.  She does not have the money to fill her [place].    Cough-  Reports that she did have a cough but it has now resolved.    Seizure disorder- reports 2 nights ago she had a seizure.  She was awake when this happened.  Pt's husband was told that seizure lasted 2.5 minutes. She reports that she has been having seizures "every other week."  She continues Keppra. She is scheduled to follow up with Dr. Smiley Houseman, her neurologist, today.   Review of Systems See HPI  Past Medical History  Diagnosis Date  . Seizures   . Narcolepsy   . Restless leg   . Depression   . Hyperlipidemia   . Allergic rhinitis   . Narcolepsy   . Diabetes mellitus without complication     History   Social History  . Marital Status: Married    Spouse Name: N/A    Number of Children: 2  . Years of Education: N/A   Occupational History  . Unemployed- full time student     Completed 12th grade   Social History Main Topics  . Smoking status: Never Smoker   . Smokeless tobacco: Never Used  . Alcohol Use: No     Comment: no  . Drug Use: No  . Sexually Active: Not on file   Other Topics Concern  . Not on file   Social History Narrative  . No narrative on file    Past Surgical History  Procedure Date  . Partial hysterectomy 2006  . Appendectomy 2006  . Tubal ligation 2002  . Appendectomy 2006    Family History  Problem Relation Age of Onset  . Heart attack Mother   . Diabetes Father   . Heart attack Father      Allergies  Allergen Reactions  . Eggs Or Egg-Derived Products Nausea And Vomiting  . Metoclopramide Hcl Other (See Comments)    Intensifies restless leg  . Penicillins Itching  . Sulfa Antibiotics     Tongue swelling    Current Outpatient Prescriptions on File Prior to Visit  Medication Sig Dispense Refill  . Armodafinil (NUVIGIL) 250 MG tablet Take 1-2 tablets by mouth every morning.      . cetirizine-pseudoephedrine (ZYRTEC-D) 5-120 MG per tablet Take 1 tablet by mouth daily.       . clonazePAM (KLONOPIN) 1 MG tablet Take 1 tablet (1 mg total) by mouth 2 (two) times daily as needed for anxiety.  60 tablet  0  . levETIRAcetam (KEPPRA) 750 MG tablet Take 2 tablets (1,500 mg total) by mouth every 12 (twelve) hours.  120 tablet  12  . metFORMIN (GLUCOPHAGE) 500 MG tablet Take 1 tablet (500 mg total) by mouth 2 (two) times daily with a meal.  60 tablet  2  . omeprazole (PRILOSEC) 20 MG capsule Take 2 capsules (40 mg total) by mouth daily.  60 capsule  5  . ondansetron (ZOFRAN)  4 MG tablet TAKE ONE TABLET BY MOUTH EVERY 6 HOURS AS NEEDED FOR NAUSEA/VOMITING  30 tablet  1  . rOPINIRole (REQUIP) 3 MG tablet Take 1 tablet (3 mg total) by mouth at bedtime.  30 tablet  2  . Vilazodone HCl (VIIBRYD) 40 MG TABS Take 1 tablet (40 mg total) by mouth daily.  90 tablet  0  . zolpidem (AMBIEN) 5 MG tablet Take 1 tablet (5 mg total) by mouth at bedtime as needed for sleep.  20 tablet  0  . [DISCONTINUED] desvenlafaxine (PRISTIQ) 100 MG 24 hr tablet Take 100 mg by mouth daily.        . [DISCONTINUED] gabapentin (NEURONTIN) 300 MG capsule Take 300 mg by mouth 4 (four) times daily.          BP 120/82  Pulse 77  Temp 98.4 F (36.9 C) (Oral)  Resp 16  Ht 5\' 5"  (1.651 m)  Wt 201 lb (91.173 kg)  BMI 33.45 kg/m2  SpO2 97%       Objective:   Physical Exam  Constitutional: She is oriented to person, place, and time. She appears well-developed and well-nourished. No distress.  HENT:       She  has some bite wounds on her tongue.   Cardiovascular: Normal rate and regular rhythm.   No murmur heard. Pulmonary/Chest: Effort normal and breath sounds normal. No respiratory distress. She has no wheezes. She has no rales. She exhibits no tenderness.  Musculoskeletal: She exhibits no edema.  Neurological: She is alert and oriented to person, place, and time.  Skin: Skin is warm and dry.  Psychiatric: She has a normal mood and affect. Her behavior is normal. Judgment and thought content normal.          Assessment & Plan:

## 2012-02-29 NOTE — Assessment & Plan Note (Addendum)
Unchanged. She has not made appointment yet with psychiatry.  I have encouraged her to do so.  Continue viibryd, follow up with Dr. Dellia Cloud.

## 2012-03-03 ENCOUNTER — Telehealth: Payer: Self-pay | Admitting: Family

## 2012-03-03 ENCOUNTER — Ambulatory Visit: Payer: Self-pay | Admitting: Psychology

## 2012-03-03 NOTE — Telephone Encounter (Signed)
Message copied by Sandford Craze on Mon Mar 03, 2012  8:54 AM ------      Message from: Darral Dash E      Created: Mon Mar 03, 2012  8:49 AM           Spoke with Victorino Dike [facility] @  [facility]  She was out of office Friday,  She see referral in her work quene  She will call patient            ----- Message -----         From: Sandford Craze, NP         Sent: 02/29/2012   1:53 PM           To: Vilinda Flake,            Could you please call [facility] at [facility] and make sure they can see my referral for her? If not can you pls fax? Pt says she left message and hasn't heard back from them. She really needs to get in soon- please let them know. Thanks!

## 2012-03-04 ENCOUNTER — Encounter: Payer: Self-pay | Admitting: Family

## 2012-03-06 ENCOUNTER — Ambulatory Visit: Payer: Self-pay | Admitting: Psychology

## 2012-03-07 ENCOUNTER — Telehealth: Payer: Self-pay | Admitting: Family

## 2012-03-07 MED ORDER — CLONAZEPAM 1 MG PO TABS: 1.0000 mg | ORAL_TABLET | Freq: Two times a day (BID) | ORAL | Status: DC | PRN

## 2012-03-07 NOTE — Telephone Encounter (Signed)
Refill called to [place] at [facility]. Left detailed message on home # re: rx completion and to call if any question.

## 2012-03-07 NOTE — Telephone Encounter (Signed)
Refill- clonazepam 1mg  tablet. Take one tablet by mouth two times a day as needed for anxiety. Qty 60 last fill 1.3.14

## 2012-03-11 ENCOUNTER — Ambulatory Visit: Payer: Self-pay | Admitting: Psychology

## 2012-03-13 ENCOUNTER — Ambulatory Visit: Payer: Self-pay | Admitting: Family

## 2012-03-13 ENCOUNTER — Telehealth: Payer: Self-pay | Admitting: Family

## 2012-03-13 NOTE — Telephone Encounter (Signed)
Called to patient in reference  Psychiatry Referral,  To Dr Christell Constant  @ [facility],  Patient states she will call their office back to schedule

## 2012-03-14 ENCOUNTER — Ambulatory Visit (INDEPENDENT_AMBULATORY_CARE_PROVIDER_SITE_OTHER): Payer: Medicaid Other | Admitting: Family

## 2012-03-14 ENCOUNTER — Encounter: Payer: Self-pay | Admitting: Family

## 2012-03-14 ENCOUNTER — Ambulatory Visit: Payer: Self-pay | Admitting: Family

## 2012-03-14 VITALS — BP 114/82 | HR 89 | Temp 98.6°F | Resp 16 | Ht 65.0 in | Wt 196.0 lb

## 2012-03-14 DIAGNOSIS — F329 Major depressive disorder, single episode, unspecified: Secondary | ICD-10-CM

## 2012-03-14 DIAGNOSIS — R894 Abnormal immunological findings in specimens from other organs, systems and tissues: Secondary | ICD-10-CM

## 2012-03-14 DIAGNOSIS — R768 Other specified abnormal immunological findings in serum: Secondary | ICD-10-CM

## 2012-03-14 DIAGNOSIS — R110 Nausea: Secondary | ICD-10-CM

## 2012-03-14 DIAGNOSIS — R51 Headache: Secondary | ICD-10-CM

## 2012-03-14 DIAGNOSIS — R569 Unspecified convulsions: Secondary | ICD-10-CM

## 2012-03-14 DIAGNOSIS — R799 Abnormal finding of blood chemistry, unspecified: Secondary | ICD-10-CM

## 2012-03-14 MED ORDER — KETOROLAC TROMETHAMINE 30 MG/ML IJ SOLN
60.0000 mg | Freq: Once | INTRAMUSCULAR | Status: AC
  Administered 2012-03-14: 60 mg via INTRAMUSCULAR

## 2012-03-14 MED ORDER — PROMETHAZINE HCL 25 MG/ML IJ SOLN
25.0000 mg | Freq: Once | INTRAMUSCULAR | Status: AC
  Administered 2012-03-14: 25 mg via INTRAMUSCULAR

## 2012-03-14 NOTE — Patient Instructions (Addendum)
Please keep your upcoming apt with psychiatry. You will be contacted about your referral to rheumatology. Please let us know if you have not heard back within 1 week about your referral. Please follow up in 2 months.

## 2012-03-14 NOTE — Progress Notes (Signed)
Subjective:    Patient ID: [name], female    DOB: Oct 29, 1975, 37 y.o.   MRN: [id number]  HPI  [name] is a 37 yr old female who presents today for follow up.  Headache- has headache since she hit her head on the bed during her last seizure.    Seizures- saw neuro. Keppra was increased.  Does not think she has had any further seizures since this medication was increased.  Depression-  She has appointment scheduled with Psychiatry next week.      Review of Systems    see HPI  Past Medical History  Diagnosis Date  . Seizures   . Narcolepsy   . Restless leg   . Depression   . Hyperlipidemia   . Allergic rhinitis   . Narcolepsy   . Diabetes mellitus without complication     History   Social History  . Marital Status: Married    Spouse Name: N/A    Number of Children: 2  . Years of Education: N/A   Occupational History  . Unemployed- full time student     Completed 12th grade   Social History Main Topics  . Smoking status: Never Smoker   . Smokeless tobacco: Never Used  . Alcohol Use: No     Comment: no  . Drug Use: No  . Sexually Active: Not on file   Other Topics Concern  . Not on file   Social History Narrative  . No narrative on file    Past Surgical History  Procedure Laterality Date  . Partial hysterectomy  2006  . Appendectomy  2006  . Tubal ligation  2002  . Appendectomy  2006    Family History  Problem Relation Age of Onset  . Heart attack Mother   . Diabetes Father   . Heart attack Father     Allergies  Allergen Reactions  . Eggs Or Egg-Derived Products Nausea And Vomiting  . Metoclopramide Hcl Other (See Comments)    Intensifies restless leg  . Penicillins Itching  . Sulfa Antibiotics     Tongue swelling    Current Outpatient Prescriptions on File Prior to Visit  Medication Sig Dispense Refill  . Armodafinil (NUVIGIL) 250 MG tablet Take 1-2 tablets by mouth every morning.      . cetirizine-pseudoephedrine  (ZYRTEC-D) 5-120 MG per tablet Take 1 tablet by mouth daily.       . clonazePAM (KLONOPIN) 1 MG tablet Take 1 tablet (1 mg total) by mouth 2 (two) times daily as needed for anxiety.  60 tablet  0  . Levetiracetam (KEPPRA XR) 750 MG TB24 Take 2 tablets (1,500 mg total) by mouth 2 (two) times daily.  120 tablet  12  . metFORMIN (GLUCOPHAGE) 500 MG tablet Take 1 tablet (500 mg total) by mouth 2 (two) times daily with a meal.  60 tablet  2  . omeprazole (PRILOSEC) 20 MG capsule Take 2 capsules (40 mg total) by mouth daily.  60 capsule  5  . ondansetron (ZOFRAN) 4 MG tablet TAKE ONE TABLET BY MOUTH EVERY 6 HOURS AS NEEDED FOR NAUSEA/VOMITING  30 tablet  1  . rOPINIRole (REQUIP) 3 MG tablet Take 1 tablet (3 mg total) by mouth at bedtime.  30 tablet  2  . Vilazodone HCl (VIIBRYD) 40 MG TABS Take 1 tablet (40 mg total) by mouth daily.  90 tablet  0  . zolpidem (AMBIEN) 5 MG tablet Take 1 tablet (5 mg total) by mouth at  bedtime as needed for sleep.  20 tablet  0  . [DISCONTINUED] desvenlafaxine (PRISTIQ) 100 MG 24 hr tablet Take 100 mg by mouth daily.        . [DISCONTINUED] gabapentin (NEURONTIN) 300 MG capsule Take 300 mg by mouth 4 (four) times daily.         No current facility-administered medications on file prior to visit.    BP 114/82  Pulse 89  Temp(Src) 98.6 F (37 C) (Oral)  Resp 16  Ht 5\' 5"  (1.651 m)  Wt 196 lb (88.905 kg)  BMI 32.62 kg/m2  SpO2 97%    Objective:   Physical Exam  Constitutional: She appears well-developed and well-nourished. No distress.  Cardiovascular: Normal rate and regular rhythm.   No murmur heard. Pulmonary/Chest: Effort normal and breath sounds normal. No respiratory distress. She has no wheezes. She has no rales. She exhibits no tenderness.  Musculoskeletal: She exhibits no edema.  Psychiatric: Her behavior is normal. Judgment and thought content normal.  Slightly flat affect          Assessment & Plan:

## 2012-03-15 NOTE — Assessment & Plan Note (Signed)
Seems improved, now in increased dose of keppra. Defer management to neuro.

## 2012-03-15 NOTE — Assessment & Plan Note (Signed)
She will briefly have insurance for the next few months.  Will try to get her in with rheumatology during this time.

## 2012-03-15 NOTE — Assessment & Plan Note (Signed)
Pt instructed to keep upcoming psychiatry apt.

## 2012-03-16 ENCOUNTER — Other Ambulatory Visit: Payer: Self-pay | Admitting: Family

## 2012-03-17 ENCOUNTER — Encounter: Payer: Self-pay | Admitting: Family

## 2012-03-17 NOTE — Telephone Encounter (Signed)
Pt also requested refills for: requip, omeprazole and zofran. Advised pt she should have refills on file at her pharmacy. She states she was told there were no refills on file for these medications. Spoke with [facility] and verified refills on file and will fill rxs for pt. Please advise re: zolpidem and klonopin request as pt states she has been taking 1 twice a day and an additional 1-2 during the day and is requesting the strength be increased.

## 2012-03-17 NOTE — Telephone Encounter (Signed)
Is it ok to give refill on zolpidem and klonopin?

## 2012-03-17 NOTE — Telephone Encounter (Signed)
Klonopin last filled on 03/07/12 #60 x no refills. Zolpidem last filled on 01/18/12 #20 x no refills. Please advise.

## 2012-03-17 NOTE — Telephone Encounter (Signed)
See letter. I would recommend she discuss klonopin dosage with her psychiatrist at her upcoming appointment.

## 2012-03-17 NOTE — Telephone Encounter (Signed)
Received pt assistance samples of Viibryd (3 bottles) Lot [id number]  EXP 08/2013. Notified pt and left samples at front desk for pick up.

## 2012-03-18 ENCOUNTER — Telehealth: Payer: Self-pay | Admitting: Neurology

## 2012-03-18 NOTE — Telephone Encounter (Signed)
No early refill on klonopin- will defer to psychiatry. OK to send #30 on ambien with no additional refills.

## 2012-03-18 NOTE — Telephone Encounter (Signed)
Spoke with Kathlene November at [facility]. He states pt brought Ambien Rx from December 13 (see EPIC) in to be filled today. No further authorizations given on Ambien at this time. Notified pt of need to discuss klonopin management with psychiatrist at upcoming appt.

## 2012-03-18 NOTE — Telephone Encounter (Signed)
Called and spoke with the patient's spouse. Aware Keppra XR ready for pick up in our office (patient assistance program). Will try to pick up today.

## 2012-03-22 ENCOUNTER — Other Ambulatory Visit: Payer: Self-pay | Admitting: Family

## 2012-03-24 NOTE — Telephone Encounter (Signed)
Denial sent to pt's pharmacy per 03/16/12 phone note. Message sent to pt.

## 2012-03-25 ENCOUNTER — Telehealth: Payer: Self-pay | Admitting: Family

## 2012-03-25 ENCOUNTER — Other Ambulatory Visit (INDEPENDENT_AMBULATORY_CARE_PROVIDER_SITE_OTHER): Payer: Self-pay

## 2012-03-25 NOTE — Telephone Encounter (Signed)
Patient referred  to Dr Corliss Skains, Rhematology , they called patient and stated because she has medicaid they will not see her, Patient has [place] [facility] and should be referred by the Physician that is on her card.

## 2012-03-26 ENCOUNTER — Encounter: Payer: Self-pay | Admitting: Family

## 2012-03-26 NOTE — Telephone Encounter (Signed)
Noted.  I spoke to pt during her visit an her [place] access is only valid for 1 month and she does not wish to establish with another PCP.

## 2012-03-27 NOTE — Telephone Encounter (Signed)
See mychart msg

## 2012-03-31 ENCOUNTER — Encounter: Payer: Self-pay | Admitting: Family

## 2012-03-31 ENCOUNTER — Ambulatory Visit (INDEPENDENT_AMBULATORY_CARE_PROVIDER_SITE_OTHER): Payer: Medicaid Other | Admitting: Family

## 2012-03-31 VITALS — BP 118/80 | HR 80 | Temp 98.3°F | Resp 16 | Wt 200.0 lb

## 2012-03-31 DIAGNOSIS — G43909 Migraine, unspecified, not intractable, without status migrainosus: Secondary | ICD-10-CM | POA: Insufficient documentation

## 2012-03-31 DIAGNOSIS — H6590 Unspecified nonsuppurative otitis media, unspecified ear: Secondary | ICD-10-CM | POA: Insufficient documentation

## 2012-03-31 MED ORDER — CEFUROXIME AXETIL 500 MG PO TABS: 500.0000 mg | ORAL_TABLET | Freq: Two times a day (BID) | ORAL | Status: DC

## 2012-03-31 NOTE — Progress Notes (Signed)
Subjective:    Patient ID: [name], female    DOB: 09-22-1975, 37 y.o.   MRN: [id number]  HPI  Otalgia- right ear x 2 weeks.   HA- frontal and across the top of her head. This has been going on for several weeks.   Depression- still having difficulty sleeping despite ambien 10mg . She is scheduled for psych consultation on Thursday.  Review of Systems See HPI  Past Medical History  Diagnosis Date  . Seizures   . Narcolepsy   . Restless leg   . Depression   . Hyperlipidemia   . Allergic rhinitis   . Narcolepsy   . Diabetes mellitus without complication     History   Social History  . Marital Status: Married    Spouse Name: N/A    Number of Children: 2  . Years of Education: N/A   Occupational History  . Unemployed- full time student     Completed 12th grade   Social History Main Topics  . Smoking status: Never Smoker   . Smokeless tobacco: Never Used  . Alcohol Use: No     Comment: no  . Drug Use: No  . Sexually Active: Not on file   Other Topics Concern  . Not on file   Social History Narrative  . No narrative on file    Past Surgical History  Procedure Laterality Date  . Partial hysterectomy  2006  . Appendectomy  2006  . Tubal ligation  2002  . Appendectomy  2006    Family History  Problem Relation Age of Onset  . Heart attack Mother   . Diabetes Father   . Heart attack Father     Allergies  Allergen Reactions  . Eggs Or Egg-Derived Products Nausea And Vomiting  . Metoclopramide Hcl Other (See Comments)    Intensifies restless leg  . Penicillins Itching  . Sulfa Antibiotics     Tongue swelling    Current Outpatient Prescriptions on File Prior to Visit  Medication Sig Dispense Refill  . Armodafinil (NUVIGIL) 250 MG tablet Take 1-2 tablets by mouth every morning.      . cetirizine-pseudoephedrine (ZYRTEC-D) 5-120 MG per tablet Take 1 tablet by mouth daily.       . clonazePAM (KLONOPIN) 1 MG tablet Take 1 tablet (1 mg total) by  mouth 2 (two) times daily as needed for anxiety.  60 tablet  0  . Levetiracetam (KEPPRA XR) 750 MG TB24 Take 2 tablets (1,500 mg total) by mouth 2 (two) times daily.  120 tablet  12  . metFORMIN (GLUCOPHAGE) 500 MG tablet Take 1 tablet (500 mg total) by mouth 2 (two) times daily with a meal.  60 tablet  2  . omeprazole (PRILOSEC) 20 MG capsule Take 2 capsules (40 mg total) by mouth daily.  60 capsule  5  . ondansetron (ZOFRAN) 4 MG tablet TAKE ONE TABLET BY MOUTH EVERY 6 HOURS AS NEEDED FOR NAUSEA/VOMITING  30 tablet  1  . rOPINIRole (REQUIP) 3 MG tablet Take 1 tablet (3 mg total) by mouth at bedtime.  30 tablet  2  . Vilazodone HCl (VIIBRYD) 40 MG TABS Take 1 tablet (40 mg total) by mouth daily.  90 tablet  0  . zolpidem (AMBIEN) 5 MG tablet Take 1 tablet (5 mg total) by mouth at bedtime as needed for sleep.  20 tablet  0  . [DISCONTINUED] desvenlafaxine (PRISTIQ) 100 MG 24 hr tablet Take 100 mg by mouth daily.        . [  DISCONTINUED] gabapentin (NEURONTIN) 300 MG capsule Take 300 mg by mouth 4 (four) times daily.         No current facility-administered medications on file prior to visit.    BP 118/80  Pulse 80  Temp(Src) 98.3 F (36.8 C) (Oral)  Resp 16  Wt 200 lb (90.719 kg)  BMI 33.28 kg/m2  SpO2 98%       Objective:   Physical Exam  Constitutional: She is oriented to person, place, and time. She appears well-developed and well-nourished. No distress.  HENT:  Yellow tinged fluid noted behind bilateral TM's.    Cardiovascular: Normal rate and regular rhythm.   No murmur heard. Pulmonary/Chest: Effort normal and breath sounds normal. No respiratory distress. She has no wheezes. She has no rales. She exhibits no tenderness.  Neurological: She is alert and oriented to person, place, and time.  Psychiatric: She has a normal mood and affect. Her behavior is normal. Judgment and thought content normal.          Assessment & Plan:

## 2012-03-31 NOTE — Patient Instructions (Addendum)
Please follow up with Dr. Smiley Houseman to discuss headaches. Call if your ear pain worsens or if it does not improve in the next 1 week.

## 2012-03-31 NOTE — Assessment & Plan Note (Signed)
Will rx with ceftin. May help with HA if she also has sinus involvement.

## 2012-03-31 NOTE — Assessment & Plan Note (Signed)
Uncontrolled.  I think this is contributing to her difficulty sleeping. I advised her to keep upcoming apt with psych.

## 2012-03-31 NOTE — Assessment & Plan Note (Signed)
Unchanged. I advised her to discuss with neuro- Dr. Smiley Houseman.

## 2012-04-02 ENCOUNTER — Encounter: Payer: Self-pay | Admitting: Family

## 2012-04-02 NOTE — Telephone Encounter (Signed)
Please fax copy of letter to  [phone]. Thanks.

## 2012-04-02 NOTE — Telephone Encounter (Signed)
Letter faxed.

## 2012-04-03 ENCOUNTER — Ambulatory Visit (HOSPITAL_COMMUNITY): Payer: Self-pay | Admitting: Psychiatry

## 2012-04-14 ENCOUNTER — Other Ambulatory Visit: Payer: Self-pay | Admitting: Family

## 2012-04-14 ENCOUNTER — Ambulatory Visit (HOSPITAL_COMMUNITY): Payer: Self-pay | Admitting: Psychiatry

## 2012-05-03 ENCOUNTER — Ambulatory Visit (INDEPENDENT_AMBULATORY_CARE_PROVIDER_SITE_OTHER): Payer: Medicaid Other | Admitting: Family Medicine

## 2012-05-03 ENCOUNTER — Encounter: Payer: Self-pay | Admitting: Family Medicine

## 2012-05-03 VITALS — BP 124/80 | HR 78 | Temp 98.0°F | Wt 200.0 lb

## 2012-05-03 DIAGNOSIS — T50905A Adverse effect of unspecified drugs, medicaments and biological substances, initial encounter: Secondary | ICD-10-CM | POA: Insufficient documentation

## 2012-05-03 DIAGNOSIS — T887XXA Unspecified adverse effect of drug or medicament, initial encounter: Secondary | ICD-10-CM

## 2012-05-03 DIAGNOSIS — N760 Acute vaginitis: Secondary | ICD-10-CM

## 2012-05-03 DIAGNOSIS — R197 Diarrhea, unspecified: Secondary | ICD-10-CM

## 2012-05-03 DIAGNOSIS — J329 Chronic sinusitis, unspecified: Secondary | ICD-10-CM

## 2012-05-03 MED ORDER — CLARITHROMYCIN ER 500 MG PO TB24: 1000.0000 mg | ORAL_TABLET | Freq: Every day | ORAL | Status: DC

## 2012-05-03 MED ORDER — FLUCONAZOLE 150 MG PO TABS: 150.0000 mg | ORAL_TABLET | Freq: Once | ORAL | Status: DC

## 2012-05-03 MED ORDER — METHYLPREDNISOLONE ACETATE 80 MG/ML IJ SUSP: 120.0000 mg | Freq: Once | INTRAMUSCULAR | Status: DC

## 2012-05-03 MED ORDER — METHYLPREDNISOLONE ACETATE 80 MG/ML IJ SUSP
120.0000 mg | Freq: Once | INTRAMUSCULAR | Status: AC
  Administered 2012-05-03: 120 mg via INTRAMUSCULAR

## 2012-05-03 NOTE — Assessment & Plan Note (Signed)
New.  Suspect yeast due to recent abx use.  Diflucan prn.

## 2012-05-03 NOTE — Addendum Note (Signed)
Addended by: Kern Reap B on: 05/03/2012 12:02 PM   Modules accepted: Orders

## 2012-05-03 NOTE — Progress Notes (Signed)
  Subjective:    Patient ID: [name], female    DOB: [birth year], 37 y.o.   MRN: [id number]  HPI Sinusitis- cough, runny nose, nasal congestion, fatigue, R ear pain, subjective fever, facial pain/pressure.  sxs started Monday.  + sick contacts.  + vomiting x1 last Saturday, ongoing diarrhea.  Had 3-4 stools overnight.  Hx of gastroparesis.  Not taking immodium for diarrhea.  + itching (feet, head, arms, groin) after taking Robitussin yesterday.  Took 1 benadryl w/out relief, no relief w/ shower.  Pt had abx 3-4 weeks ago for similar sxs.  Pt thinks she now has yeast infxn.   Review of Systems For ROS see HPI     Objective:   Physical Exam  Vitals reviewed. Constitutional: She is oriented to person, place, and time. She appears well-developed and well-nourished. No distress.  HENT:  Head: Normocephalic and atraumatic.  Right Ear: Tympanic membrane normal.  Left Ear: Tympanic membrane normal.  Nose: Mucosal edema and rhinorrhea present. Right sinus exhibits maxillary sinus tenderness and frontal sinus tenderness. Left sinus exhibits maxillary sinus tenderness and frontal sinus tenderness.  Mouth/Throat: Uvula is midline and mucous membranes are normal. Posterior oropharyngeal erythema present. No oropharyngeal exudate.  Eyes: Conjunctivae and EOM are normal. Pupils are equal, round, and reactive to light.  Neck: Normal range of motion. Neck supple.  Cardiovascular: Normal rate, regular rhythm and normal heart sounds.   Pulmonary/Chest: Effort normal and breath sounds normal. No respiratory distress. She has no wheezes.  Abdominal: Soft. Bowel sounds are normal. She exhibits no distension. There is no tenderness. There is no rebound and no guarding.  Lymphadenopathy:    She has no cervical adenopathy.  Neurological: She is alert and oriented to person, place, and time.  Skin: Skin is warm and dry. No erythema.          Assessment & Plan:

## 2012-05-03 NOTE — Assessment & Plan Note (Signed)
New.  Suspect viral illness.  immodium as needed.  Encouraged fluids.  Reviewed supportive care and red flags that should prompt return.

## 2012-05-03 NOTE — Assessment & Plan Note (Signed)
New.  Pt developed diffuse body itching after taking OTC cough syrup.  Depo-medrol injxn given in office for itching.  Benadryl as needed.  No rash present.  No facial or throat swelling.

## 2012-05-03 NOTE — Patient Instructions (Addendum)
Start the Biaxin- 2 tabs daily at the same time- take w/ food for the sinuses Drink plenty of fluids Mucinex DM to thin your congestion Restart Zyrtec daily for the allergy component Benadryl as needed for itching DO NOT take the Robitussin again in case of possible allergy Take the Diflucan for yeast Immodium as needed for diarrhea REST! Hang in there!!

## 2012-05-03 NOTE — Assessment & Plan Note (Signed)
New to provider.  Pt's sxs consistent w/ infxn.  Start abx.  Encouraged daily use of allergy meds to prevent recurrent infxn.  Reviewed supportive care and red flags that should prompt return.  Pt expressed understanding and is in agreement w/ plan.

## 2012-05-08 ENCOUNTER — Encounter (HOSPITAL_BASED_OUTPATIENT_CLINIC_OR_DEPARTMENT_OTHER): Payer: Self-pay

## 2012-05-08 ENCOUNTER — Emergency Department (HOSPITAL_BASED_OUTPATIENT_CLINIC_OR_DEPARTMENT_OTHER)
Admission: EM | Admit: 2012-05-08 | Discharge: 2012-05-09 | Disposition: A | Discharge status: Home / Self Care | Payer: Medicaid Other | Attending: Emergency Medicine | Admitting: Emergency Medicine

## 2012-05-08 DIAGNOSIS — Z862 Personal history of diseases of the blood and blood-forming organs and certain disorders involving the immune mechanism: Secondary | ICD-10-CM | POA: Insufficient documentation

## 2012-05-08 DIAGNOSIS — R110 Nausea: Secondary | ICD-10-CM | POA: Insufficient documentation

## 2012-05-08 DIAGNOSIS — H53149 Visual discomfort, unspecified: Secondary | ICD-10-CM | POA: Insufficient documentation

## 2012-05-08 DIAGNOSIS — G40909 Epilepsy, unspecified, not intractable, without status epilepticus: Secondary | ICD-10-CM | POA: Insufficient documentation

## 2012-05-08 DIAGNOSIS — Z8639 Personal history of other endocrine, nutritional and metabolic disease: Secondary | ICD-10-CM | POA: Insufficient documentation

## 2012-05-08 DIAGNOSIS — Z8709 Personal history of other diseases of the respiratory system: Secondary | ICD-10-CM | POA: Insufficient documentation

## 2012-05-08 DIAGNOSIS — E119 Type 2 diabetes mellitus without complications: Secondary | ICD-10-CM | POA: Insufficient documentation

## 2012-05-08 DIAGNOSIS — Z8669 Personal history of other diseases of the nervous system and sense organs: Secondary | ICD-10-CM | POA: Insufficient documentation

## 2012-05-08 DIAGNOSIS — IMO0002 Reserved for concepts with insufficient information to code with codable children: Secondary | ICD-10-CM | POA: Insufficient documentation

## 2012-05-08 DIAGNOSIS — F329 Major depressive disorder, single episode, unspecified: Secondary | ICD-10-CM | POA: Insufficient documentation

## 2012-05-08 DIAGNOSIS — Z79899 Other long term (current) drug therapy: Secondary | ICD-10-CM | POA: Insufficient documentation

## 2012-05-08 DIAGNOSIS — F3289 Other specified depressive episodes: Secondary | ICD-10-CM | POA: Insufficient documentation

## 2012-05-08 DIAGNOSIS — R51 Headache: Secondary | ICD-10-CM | POA: Insufficient documentation

## 2012-05-08 MED ORDER — ONDANSETRON HCL 4 MG/2ML IJ SOLN
4.0000 mg | Freq: Once | INTRAMUSCULAR | Status: AC
  Administered 2012-05-08: 4 mg via INTRAVENOUS
  Filled 2012-05-08: qty 2

## 2012-05-08 MED ORDER — KETOROLAC TROMETHAMINE 15 MG/ML IJ SOLN
15.0000 mg | Freq: Once | INTRAMUSCULAR | Status: AC
  Administered 2012-05-08: 15 mg via INTRAVENOUS
  Filled 2012-05-08: qty 1

## 2012-05-08 MED ORDER — SODIUM CHLORIDE 0.9 % IV BOLUS (SEPSIS)
1000.0000 mL | Freq: Once | INTRAVENOUS | Status: AC
  Administered 2012-05-08: 1000 mL via INTRAVENOUS

## 2012-05-08 MED ORDER — HYDROMORPHONE HCL PF 1 MG/ML IJ SOLN
1.0000 mg | Freq: Once | INTRAMUSCULAR | Status: AC
  Administered 2012-05-08: 1 mg via INTRAVENOUS
  Filled 2012-05-08: qty 1

## 2012-05-08 NOTE — ED Provider Notes (Signed)
History     CSN: [id number]  Arrival date & time 05/08/12  2135   First MD Initiated Contact with Patient 05/08/12 2316      Chief Complaint  Patient presents with  . Headache    (Consider location/radiation/quality/duration/timing/severity/associated sxs/prior treatment) HPI This is a 37 year old female with a seizure disorder. She believes she had a seizure yesterday afternoon while taking a nap. She has subsequently developed a headache. The headache and fall the right side of the head and is described as throbbing. It radiates to the soft tissue of the right neck. There is some mild nausea and photophobia associated with it. She describes the pain as severe. It has not responded to ibuprofen or hydrocodone. There is no focal neurologic deficit. The headache is similar to prior headaches except in its unilaterality.  Past Medical History  Diagnosis Date  . Seizures   . Narcolepsy   . Restless leg   . Depression   . Hyperlipidemia   . Allergic rhinitis   . Narcolepsy   . Diabetes mellitus without complication     Past Surgical History  Procedure Laterality Date  . Partial hysterectomy  2006  . Appendectomy  2006  . Tubal ligation  2002  . Appendectomy  2006  . Abdominal hysterectomy      Family History  Problem Relation Age of Onset  . Heart attack Mother   . Diabetes Father   . Heart attack Father     History  Substance Use Topics  . Smoking status: Never Smoker   . Smokeless tobacco: Never Used  . Alcohol Use: No    OB History   Grav Para Term Preterm Abortions TAB SAB Ect Mult Living                  Review of Systems  All other systems reviewed and are negative.    Allergies  Eggs or egg-derived products; Metoclopramide hcl; Penicillins; and Sulfa antibiotics  Home Medications   Current Outpatient Rx  Name  Route  Sig  Dispense  Refill  . Armodafinil (NUVIGIL) 250 MG tablet      Take 1-2 tablets by mouth every morning.         .  cetirizine-pseudoephedrine (ZYRTEC-D) 5-120 MG per tablet   Oral   Take 1 tablet by mouth daily.          . clarithromycin (BIAXIN XL) 500 MG 24 hr tablet   Oral   Take 2 tablets (1,000 mg total) by mouth daily.   20 tablet   0   . clonazePAM (KLONOPIN) 1 MG tablet   Oral   Take 1 tablet (1 mg total) by mouth 2 (two) times daily as needed for anxiety.   60 tablet   0   . fluconazole (DIFLUCAN) 150 MG tablet   Oral   Take 1 tablet (150 mg total) by mouth once. May repeat in 3 days if symptoms persist   2 tablet   1   . Levetiracetam (KEPPRA XR) 750 MG TB24   Oral   Take 2 tablets (1,500 mg total) by mouth 2 (two) times daily.   120 tablet   12   . metFORMIN (GLUCOPHAGE) 500 MG tablet   Oral   Take 1 tablet (500 mg total) by mouth 2 (two) times daily with a meal.   60 tablet   2   . omeprazole (PRILOSEC) 20 MG capsule   Oral   Take 2 capsules (40 mg total) by  mouth daily.   60 capsule   5   . ondansetron (ZOFRAN) 4 MG tablet      TAKE ONE TABLET BY MOUTH EVERY 6 HOURS AS NEEDED FOR NAUSEA/VOMITING   30 tablet   1   . rOPINIRole (REQUIP) 3 MG tablet      TAKE ONE TABLET BY MOUTH AT BEDTIME   30 tablet   2   . Vilazodone HCl (VIIBRYD) 40 MG TABS   Oral   Take 1 tablet (40 mg total) by mouth daily.   90 tablet   0   . zolpidem (AMBIEN) 5 MG tablet   Oral   Take 1 tablet (5 mg total) by mouth at bedtime as needed for sleep.   20 tablet   0     BP 132/82  Pulse 75  Temp(Src) 98.5 F (36.9 C) (Oral)  Resp 20  Ht 5\' 6"  (1.676 m)  Wt 196 lb (88.905 kg)  BMI 31.65 kg/m2  SpO2 98%  Physical Exam General: Well-developed, well-nourished female in no acute distress; appearance consistent with age of record HENT: normocephalic, atraumatic Eyes: pupils equal round and reactive to light; extraocular muscles intact Neck: supple; right-sided soft tissue tenderness; no C-spine tenderness Heart: regular rate and rhythm; no murmurs, rubs or gallops Lungs:  clear to auscultation bilaterally Abdomen: soft; obese; nontender; bowel sounds present Extremities: No deformity; full range of motion; pulses normal Neurologic: Awake, alert and oriented; motor function intact in all extremities and symmetric; no facial droop Skin: Warm and dry Psychiatric: Flat affect    ED Course  Procedures (including critical care time)     MDM  12:59 AM Nausea resolved, headache improved after IV medications.        Carlisle Beers Azriel Dancy, MD 05/09/12 0100

## 2012-05-08 NOTE — ED Notes (Signed)
C/o right side HA and neck pain-started yesterday after ?seizure yesterday-pt states she was napping and had possible seizure-hx of seizures

## 2012-05-08 NOTE — ED Notes (Signed)
MD at bedside. 

## 2012-05-09 MED ORDER — OXYCODONE-ACETAMINOPHEN 5-325 MG PO TABS: 1.0000 | ORAL_TABLET | Freq: Four times a day (QID) | ORAL | Status: DC | PRN

## 2012-05-09 MED ORDER — HYDROMORPHONE HCL PF 1 MG/ML IJ SOLN
1.0000 mg | Freq: Once | INTRAMUSCULAR | Status: AC
  Administered 2012-05-09: 1 mg via INTRAVENOUS
  Filled 2012-05-09: qty 1

## 2012-05-12 ENCOUNTER — Encounter: Payer: Self-pay | Admitting: Neurology

## 2012-05-12 ENCOUNTER — Ambulatory Visit (INDEPENDENT_AMBULATORY_CARE_PROVIDER_SITE_OTHER): Payer: Medicaid Other | Admitting: Neurology

## 2012-05-12 VITALS — BP 108/70 | HR 80 | Temp 98.2°F | Resp 12 | Ht 66.0 in | Wt 200.0 lb

## 2012-05-12 DIAGNOSIS — G40309 Generalized idiopathic epilepsy and epileptic syndromes, not intractable, without status epilepticus: Secondary | ICD-10-CM

## 2012-05-12 DIAGNOSIS — G43909 Migraine, unspecified, not intractable, without status migrainosus: Secondary | ICD-10-CM

## 2012-05-12 DIAGNOSIS — G47419 Narcolepsy without cataplexy: Secondary | ICD-10-CM

## 2012-05-12 MED ORDER — CLONAZEPAM 1 MG PO TABS: 1.0000 mg | ORAL_TABLET | Freq: Two times a day (BID) | ORAL | Status: DC | PRN

## 2012-05-12 NOTE — Patient Instructions (Addendum)
Follow up in two months with Dr. Smiley Houseman.

## 2012-05-12 NOTE — Progress Notes (Signed)
[name] returns for one-month followup.  Since increasing the Keppra to 1500 mg long-acting twice daily, she has improved.  She may have had one event during a nap.  When she woke up she thought she may have bitten her tongue and she felt a little tired.  This was preceded by several days of stress. She had to move back in with her parents and she had to give up her two animals for adoption.  Since that time she's had a headache mainly on the right side of the head and Percocet will only use it temporarily. She has not run out of Percocet. She is also to see a psychiatrist tomorrow about possibly resuming clonazepam or something for her anxiety and tension.  She has not taken the clonazepam in a couple of months and she is also not taking the new visual regularly at this time and she is not using the Ambien at this time.  She states Imitrex in the past really didn't help her headaches and she doesn't want to try Neurontin because it didn't help her nerve pain in her arm in the past, although she never took for headaches.  Review of systems is positive for right-sided headache, sleep disruption, anxiety, congestive disturbance and urination at night.  Remainder of the review of systems is unremarkable.  Past Medical History  Diagnosis Date  . Seizures   . Narcolepsy   . Restless leg   . Depression   . Hyperlipidemia   . Allergic rhinitis   . Narcolepsy   . Diabetes mellitus without complication     Current Outpatient Prescriptions on File Prior to Visit  Medication Sig Dispense Refill  . Armodafinil (NUVIGIL) 250 MG tablet Take 1-2 tablets by mouth every morning.      . cetirizine-pseudoephedrine (ZYRTEC-D) 5-120 MG per tablet Take 1 tablet by mouth daily.       . clarithromycin (BIAXIN XL) 500 MG 24 hr tablet Take 2 tablets (1,000 mg total) by mouth daily.  20 tablet  0  . Levetiracetam (KEPPRA XR) 750 MG TB24 Take 2 tablets (1,500 mg total) by mouth 2 (two) times daily.  120 tablet  12  .  metFORMIN (GLUCOPHAGE) 500 MG tablet Take 1 tablet (500 mg total) by mouth 2 (two) times daily with a meal.  60 tablet  2  . omeprazole (PRILOSEC) 20 MG capsule Take 2 capsules (40 mg total) by mouth daily.  60 capsule  5  . ondansetron (ZOFRAN) 4 MG tablet TAKE ONE TABLET BY MOUTH EVERY 6 HOURS AS NEEDED FOR NAUSEA/VOMITING  30 tablet  1  . oxyCODONE-acetaminophen (PERCOCET) 5-325 MG per tablet Take 1-2 tablets by mouth every 6 (six) hours as needed for pain.  20 tablet  0  . rOPINIRole (REQUIP) 3 MG tablet TAKE ONE TABLET BY MOUTH AT BEDTIME  30 tablet  2  . Vilazodone HCl (VIIBRYD) 40 MG TABS Take 1 tablet (40 mg total) by mouth daily.  90 tablet  0  . zolpidem (AMBIEN) 5 MG tablet Take 1 tablet (5 mg total) by mouth at bedtime as needed for sleep.  20 tablet  0  . fluconazole (DIFLUCAN) 150 MG tablet Take 1 tablet (150 mg total) by mouth once. May repeat in 3 days if symptoms persist  2 tablet  1  . [DISCONTINUED] desvenlafaxine (PRISTIQ) 100 MG 24 hr tablet Take 100 mg by mouth daily.        . [DISCONTINUED] gabapentin (NEURONTIN) 300 MG capsule Take 300 mg  by mouth 4 (four) times daily.         No current facility-administered medications on file prior to visit.   Eggs or egg-derived products; Metoclopramide hcl; Penicillins; and Sulfa antibiotics allergies History   Social History  . Marital Status: Married    Spouse Name: N/A    Number of Children: 2  . Years of Education: N/A   Occupational History  . Unemployed- full time student     Completed 12th grade   Social History Main Topics  . Smoking status: Never Smoker   . Smokeless tobacco: Never Used  . Alcohol Use: No  . Drug Use: No  . Sexually Active: Not on file   Other Topics Concern  . Not on file   Social History Narrative  . No narrative on file    Family History  Problem Relation Age of Onset  . Heart attack Mother   . Diabetes Father   . Heart attack Father      BP 108/70  Pulse 80  Temp(Src) 98.2 F  (36.8 C)  Resp 12  Ht 5\' 6"  (1.676 m)  Wt 200 lb (90.719 kg)  BMI 32.3 kg/m2   Alert and oriented x 3.  Memory function appears to be intact.  She complains of a right-sided headache that switches to the left medicines in intensity after some acupressure .Concentration and attention are normal for educational level and background.  Speech is fluent and without significant word finding difficulty.  Is aware of current events.  No carotid bruits detected.  Cranial nerve II through XII are within normal limits.  This includes normal optic discs and acuity, EOMI, PERLA, facial movement and sensation intact, hearing grossly intact, gag intact,Uvula raises symmetrically and tongue protrudes evenly. Motor strength is 5 over 5 throughout all limbs.  No atrophy, abnormal tone or tremors. Reflexes are 2+ and symmetric in the upper and lower extremities Sensory exam is intact. Coordination is intact for fine movements and rapid alternating movements in all limbs  Impression: 1. Seizures with much better control on Keppra 3000 mg long-acting form. 2. Migrainesdue  to recent stress 3. History of narcolepsy unchanged 4. History of restless leg syndrome responding adequately to medications.  Plan: 1. Continue Keppra and we will check a Keppra level today. 2. She can resume clonazepam for sleep and anxiety pending her consultation tomorrow. 3. She can try Excedrin tonight if the headache does not improve in the next few days to let me know. 4. Return here in 2 months. Gait and station are normal.

## 2012-05-13 ENCOUNTER — Ambulatory Visit (HOSPITAL_COMMUNITY): Payer: Medicaid Other | Admitting: Psychiatry

## 2012-05-13 ENCOUNTER — Emergency Department (HOSPITAL_BASED_OUTPATIENT_CLINIC_OR_DEPARTMENT_OTHER)
Admission: EM | Admit: 2012-05-13 | Discharge: 2012-05-14 | Disposition: A | Discharge status: Home / Self Care | Payer: Medicaid Other | Attending: Emergency Medicine | Admitting: Emergency Medicine

## 2012-05-13 ENCOUNTER — Encounter (HOSPITAL_BASED_OUTPATIENT_CLINIC_OR_DEPARTMENT_OTHER): Payer: Self-pay

## 2012-05-13 ENCOUNTER — Telehealth: Payer: Self-pay | Admitting: Neurology

## 2012-05-13 DIAGNOSIS — G40909 Epilepsy, unspecified, not intractable, without status epilepticus: Secondary | ICD-10-CM | POA: Insufficient documentation

## 2012-05-13 DIAGNOSIS — R51 Headache: Secondary | ICD-10-CM | POA: Insufficient documentation

## 2012-05-13 DIAGNOSIS — E119 Type 2 diabetes mellitus without complications: Secondary | ICD-10-CM | POA: Insufficient documentation

## 2012-05-13 DIAGNOSIS — Z79899 Other long term (current) drug therapy: Secondary | ICD-10-CM | POA: Insufficient documentation

## 2012-05-13 DIAGNOSIS — Z862 Personal history of diseases of the blood and blood-forming organs and certain disorders involving the immune mechanism: Secondary | ICD-10-CM | POA: Insufficient documentation

## 2012-05-13 DIAGNOSIS — Z8669 Personal history of other diseases of the nervous system and sense organs: Secondary | ICD-10-CM | POA: Insufficient documentation

## 2012-05-13 DIAGNOSIS — Z8709 Personal history of other diseases of the respiratory system: Secondary | ICD-10-CM | POA: Insufficient documentation

## 2012-05-13 DIAGNOSIS — Z8639 Personal history of other endocrine, nutritional and metabolic disease: Secondary | ICD-10-CM | POA: Insufficient documentation

## 2012-05-13 DIAGNOSIS — IMO0002 Reserved for concepts with insufficient information to code with codable children: Secondary | ICD-10-CM | POA: Insufficient documentation

## 2012-05-13 DIAGNOSIS — F3289 Other specified depressive episodes: Secondary | ICD-10-CM | POA: Insufficient documentation

## 2012-05-13 DIAGNOSIS — F329 Major depressive disorder, single episode, unspecified: Secondary | ICD-10-CM | POA: Insufficient documentation

## 2012-05-13 LAB — LEVETIRACETAM LEVEL: Keppra (Levetiracetam): 16.8 ug/mL (ref 5.0–30.0)

## 2012-05-13 MED ORDER — PROMETHAZINE HCL 25 MG/ML IJ SOLN
25.0000 mg | Freq: Once | INTRAMUSCULAR | Status: AC
  Administered 2012-05-13: 25 mg via INTRAMUSCULAR
  Filled 2012-05-13: qty 1

## 2012-05-13 MED ORDER — DIPHENHYDRAMINE HCL 50 MG/ML IJ SOLN
50.0000 mg | Freq: Once | INTRAMUSCULAR | Status: AC
  Administered 2012-05-13: 50 mg via INTRAMUSCULAR
  Filled 2012-05-13: qty 1

## 2012-05-13 MED ORDER — SUMATRIPTAN SUCCINATE 6 MG/0.5ML ~~LOC~~ SOLN
6.0000 mg | Freq: Once | SUBCUTANEOUS | Status: AC
  Administered 2012-05-13: 6 mg via SUBCUTANEOUS
  Filled 2012-05-13: qty 0.5

## 2012-05-13 MED ORDER — KETOROLAC TROMETHAMINE 60 MG/2ML IM SOLN
60.0000 mg | Freq: Once | INTRAMUSCULAR | Status: AC
  Administered 2012-05-13: 60 mg via INTRAMUSCULAR
  Filled 2012-05-13: qty 2

## 2012-05-13 NOTE — ED Notes (Signed)
Pt seen here on 4/3 for headache, d/c'd with percocet, headache improved but continues, pt was saw at pcp for headache, given klonopin for pain, secondary to shooting pain down right side of head and neck, pt reports no improvement of pain with klonopin.

## 2012-05-13 NOTE — Telephone Encounter (Signed)
Returning a call left in my vm at 0902 this morning. Spoke with the patient, [name]. She is c/o of a HA and neck pain. She is taking the Klonopin, Excederin and Ibuprophen but is still having pain. I let her know that Dr. Smiley Houseman was out of the office until tomorrow am and that I would let him know and see if he has any recommendations. She is ok to wait. She was seen yesterday by Dr. Smiley Houseman. She did not see the psychiatrist as scheduled for today as they did not take her insurance. I did call to verify that and that was indeed true. **Dr. Smiley Houseman, please advise patient's request for additional pain medication.

## 2012-05-13 NOTE — ED Provider Notes (Signed)
History     CSN: [id number]  Arrival date & time 05/13/12  2148   First MD Initiated Contact with Patient 05/13/12 2253      Chief Complaint  Patient presents with  . Headache    (Consider location/radiation/quality/duration/timing/severity/associated sxs/prior treatment) HPI Comments: Patient presented to the ER for evaluation of headache. Patient reports that she has a history of chronic migraine headaches. She was seen April 2 for this headache and administer medications. She reports that the headache resolved, but then recurred. She was seen yesterday by her neurologist. She says it he was thinking about changing her medications, but did not make any changes other than adding Klonopin. She reports that this has not helped. Patient is complaining of a severe pain on the right side of her head, behind the right eye radiating to the right neck area. This identical to what she was experiencing on April 2.  Patient is a 37 y.o. female presenting with headaches.  Headache Associated symptoms: no fever     Past Medical History  Diagnosis Date  . Seizures   . Narcolepsy   . Restless leg   . Depression   . Hyperlipidemia   . Allergic rhinitis   . Narcolepsy   . Diabetes mellitus without complication     Past Surgical History  Procedure Laterality Date  . Partial hysterectomy  2006  . Appendectomy  2006  . Tubal ligation  2002  . Appendectomy  2006  . Abdominal hysterectomy      Family History  Problem Relation Age of Onset  . Heart attack Mother   . Diabetes Father   . Heart attack Father     History  Substance Use Topics  . Smoking status: Never Smoker   . Smokeless tobacco: Never Used  . Alcohol Use: No    OB History   Grav Para Term Preterm Abortions TAB SAB Ect Mult Living                  Review of Systems  Constitutional: Negative for fever.  Neurological: Positive for headaches.  All other systems reviewed and are negative.    Allergies  Eggs or  egg-derived products; Metoclopramide hcl; Penicillins; and Sulfa antibiotics  Home Medications   Current Outpatient Rx  Name  Route  Sig  Dispense  Refill  . Armodafinil (NUVIGIL) 250 MG tablet      Take 1-2 tablets by mouth every morning.         . cetirizine-pseudoephedrine (ZYRTEC-D) 5-120 MG per tablet   Oral   Take 1 tablet by mouth daily.          . clarithromycin (BIAXIN XL) 500 MG 24 hr tablet   Oral   Take 2 tablets (1,000 mg total) by mouth daily.   20 tablet   0   . clonazePAM (KLONOPIN) 1 MG tablet   Oral   Take 1 tablet (1 mg total) by mouth 2 (two) times daily as needed for anxiety.   60 tablet   0   . fluconazole (DIFLUCAN) 150 MG tablet   Oral   Take 1 tablet (150 mg total) by mouth once. May repeat in 3 days if symptoms persist   2 tablet   1   . Levetiracetam (KEPPRA XR) 750 MG TB24   Oral   Take 2 tablets (1,500 mg total) by mouth 2 (two) times daily.   120 tablet   12   . metFORMIN (GLUCOPHAGE) 500 MG tablet  Oral   Take 1 tablet (500 mg total) by mouth 2 (two) times daily with a meal.   60 tablet   2   . omeprazole (PRILOSEC) 20 MG capsule   Oral   Take 2 capsules (40 mg total) by mouth daily.   60 capsule   5   . ondansetron (ZOFRAN) 4 MG tablet      TAKE ONE TABLET BY MOUTH EVERY 6 HOURS AS NEEDED FOR NAUSEA/VOMITING   30 tablet   1   . oxyCODONE-acetaminophen (PERCOCET) 5-325 MG per tablet   Oral   Take 1-2 tablets by mouth every 6 (six) hours as needed for pain.   20 tablet   0   . rOPINIRole (REQUIP) 3 MG tablet      TAKE ONE TABLET BY MOUTH AT BEDTIME   30 tablet   2   . Vilazodone HCl (VIIBRYD) 40 MG TABS   Oral   Take 1 tablet (40 mg total) by mouth daily.   90 tablet   0   . zolpidem (AMBIEN) 5 MG tablet   Oral   Take 1 tablet (5 mg total) by mouth at bedtime as needed for sleep.   20 tablet   0     BP 119/88  Pulse 88  Temp(Src) 98.3 F (36.8 C) (Oral)  Resp 20  SpO2 98%  Physical Exam   Constitutional: She is oriented to person, place, and time. She appears well-developed and well-nourished. No distress.  HENT:  Head: Normocephalic and atraumatic.  Right Ear: Hearing normal.  Nose: Nose normal.  Mouth/Throat: Oropharynx is clear and moist and mucous membranes are normal.  Eyes: Conjunctivae and EOM are normal. Pupils are equal, round, and reactive to light.  Neck: Normal range of motion. Neck supple.  Cardiovascular: Normal rate, regular rhythm, S1 normal and S2 normal.  Exam reveals no gallop and no friction rub.   No murmur heard. Pulmonary/Chest: Effort normal and breath sounds normal. No respiratory distress. She exhibits no tenderness.  Abdominal: Soft. Normal appearance and bowel sounds are normal. There is no hepatosplenomegaly. There is no tenderness. There is no rebound, no guarding, no tenderness at McBurney's point and negative Murphy's sign. No hernia.  Musculoskeletal: Normal range of motion.  Neurological: She is alert and oriented to person, place, and time. She has normal strength. No cranial nerve deficit or sensory deficit. Coordination normal. GCS eye subscore is 4. GCS verbal subscore is 5. GCS motor subscore is 6.  Skin: Skin is warm, dry and intact. No rash noted. No cyanosis.  Psychiatric: She has a normal mood and affect. Her speech is normal and behavior is normal. Thought content normal.    ED Course  Procedures (including critical care time)  Labs Reviewed - No data to display No results found.   Diagnosis: Headache    MDM  Patient presents to the ER once again with complaints of headache. Patient was seen for an identical headache 1 week ago. She was also seen by her neurology headache specialist yesterday for this headache. She has no focal deficits on examination. Patient reportedly used Imitrex in the past, but has not used in many years. Her neurologist was considering restarting this. We will administer a cocktail of medications aimed  at relieving migraine headache and have patient followup with neurologist.        Gilda Crease, MD 05/13/12 2328

## 2012-05-13 NOTE — ED Notes (Addendum)
HA since 4/2-was sen by neuro yesterday

## 2012-05-13 NOTE — ED Notes (Signed)
MD at bedside. 

## 2012-05-14 MED ORDER — SUMATRIPTAN SUCCINATE 100 MG PO TABS: ORAL_TABLET | ORAL | Status: DC

## 2012-05-14 NOTE — Telephone Encounter (Signed)
She mentioned it did not work in the past, but it was years ago when she tried it.  I recommend immitrex 50 to 100 mg twice a day for 48 hours.  We can call in as imitrex (generic) 100 mg 1/2 or 1 pill bid for 48 hours then bid prn #9 with 5 refills

## 2012-05-15 ENCOUNTER — Telehealth: Payer: Self-pay | Admitting: Family

## 2012-05-15 ENCOUNTER — Telehealth: Payer: Self-pay | Admitting: Neurology

## 2012-05-15 MED ORDER — ZOLPIDEM TARTRATE 5 MG PO TABS: 5.0000 mg | ORAL_TABLET | Freq: Every evening | ORAL | Status: DC | PRN

## 2012-05-15 NOTE — Telephone Encounter (Signed)
The patient left me a vm message at 11:33 am stating she had a terrible HA, she had taken 1/2 tab of Imitrex this am and it had not helped. She was crying during the message. She went on to say that if she had not heard back from me in the next 10 to 15 minutes she was going to have her husband take her to the ER as she just couldn't stand the pain. As I was reviewing her chart and before I could call her back, I picked up a call from her husband. He again repeated to me about her HA etc. She had taken a 1/2 of an Imitrex at 0800 but had not repeated it. I encouraged her to take a whole tab as well as something for nausea and to lie down in a quiet, dark environment and hopefully she would get relief soon. Reinforced the Imitrex instructions with him. She was in the ER on 04/08. He asked what to do if the HA did not get better and unfortunately I told him that they may need to go back to the ER. Dr. Smiley Houseman is out of the office until 04/14. Asked that he call if we could help him further. He states he will. **Dr. Smiley Houseman...Marland KitchenMarland KitchenMarland Kitchenjust a FYI.

## 2012-05-15 NOTE — Telephone Encounter (Signed)
Pt called stating she has had a headache since 05/07/12. Pt got Rx from Dr Smiley Houseman yesterday but states it has not helped her headache. Advised pt to contact Dr Smiley Houseman for further recommendations.

## 2012-05-15 NOTE — Telephone Encounter (Signed)
Refill called to [facility].

## 2012-05-15 NOTE — Telephone Encounter (Signed)
Refill- zolpidem tartrate 5mg  tab. Take one tablet by mouth at bedtime as needed for sleep. Qty 15 last fill 2.11.14

## 2012-05-15 NOTE — Telephone Encounter (Signed)
I agree

## 2012-05-16 ENCOUNTER — Ambulatory Visit: Payer: Self-pay | Admitting: Family

## 2012-05-18 ENCOUNTER — Emergency Department (HOSPITAL_COMMUNITY)
Admission: EM | Admit: 2012-05-18 | Discharge: 2012-05-18 | Disposition: A | Discharge status: Home / Self Care | Payer: Medicaid Other | Attending: Emergency Medicine | Admitting: Emergency Medicine

## 2012-05-18 ENCOUNTER — Encounter (HOSPITAL_COMMUNITY): Payer: Self-pay | Admitting: *Deleted

## 2012-05-18 DIAGNOSIS — E119 Type 2 diabetes mellitus without complications: Secondary | ICD-10-CM | POA: Insufficient documentation

## 2012-05-18 DIAGNOSIS — R209 Unspecified disturbances of skin sensation: Secondary | ICD-10-CM | POA: Insufficient documentation

## 2012-05-18 DIAGNOSIS — E785 Hyperlipidemia, unspecified: Secondary | ICD-10-CM | POA: Insufficient documentation

## 2012-05-18 DIAGNOSIS — R110 Nausea: Secondary | ICD-10-CM | POA: Insufficient documentation

## 2012-05-18 DIAGNOSIS — F329 Major depressive disorder, single episode, unspecified: Secondary | ICD-10-CM | POA: Insufficient documentation

## 2012-05-18 DIAGNOSIS — Z79899 Other long term (current) drug therapy: Secondary | ICD-10-CM | POA: Insufficient documentation

## 2012-05-18 DIAGNOSIS — F3289 Other specified depressive episodes: Secondary | ICD-10-CM | POA: Insufficient documentation

## 2012-05-18 DIAGNOSIS — G40909 Epilepsy, unspecified, not intractable, without status epilepticus: Secondary | ICD-10-CM | POA: Insufficient documentation

## 2012-05-18 DIAGNOSIS — H53149 Visual discomfort, unspecified: Secondary | ICD-10-CM | POA: Insufficient documentation

## 2012-05-18 DIAGNOSIS — G2581 Restless legs syndrome: Secondary | ICD-10-CM | POA: Insufficient documentation

## 2012-05-18 DIAGNOSIS — Z8709 Personal history of other diseases of the respiratory system: Secondary | ICD-10-CM | POA: Insufficient documentation

## 2012-05-18 DIAGNOSIS — F411 Generalized anxiety disorder: Secondary | ICD-10-CM | POA: Insufficient documentation

## 2012-05-18 DIAGNOSIS — G43909 Migraine, unspecified, not intractable, without status migrainosus: Secondary | ICD-10-CM | POA: Insufficient documentation

## 2012-05-18 MED ORDER — ONDANSETRON HCL 4 MG/2ML IJ SOLN
4.0000 mg | Freq: Once | INTRAMUSCULAR | Status: AC
  Administered 2012-05-18: 4 mg via INTRAVENOUS
  Filled 2012-05-18: qty 2

## 2012-05-18 MED ORDER — HALOPERIDOL LACTATE 5 MG/ML IJ SOLN
3.0000 mg | Freq: Once | INTRAMUSCULAR | Status: AC
  Administered 2012-05-18: 3 mg via INTRAVENOUS
  Filled 2012-05-18: qty 1

## 2012-05-18 MED ORDER — KETOROLAC TROMETHAMINE 30 MG/ML IJ SOLN
30.0000 mg | Freq: Once | INTRAMUSCULAR | Status: AC
  Administered 2012-05-18: 30 mg via INTRAVENOUS
  Filled 2012-05-18: qty 1

## 2012-05-18 MED ORDER — LACTATED RINGERS IV BOLUS (SEPSIS): 500.0000 mL | Freq: Once | INTRAVENOUS | Status: DC

## 2012-05-18 MED ORDER — DEXAMETHASONE SODIUM PHOSPHATE 10 MG/ML IJ SOLN
10.0000 mg | Freq: Once | INTRAMUSCULAR | Status: AC
  Administered 2012-05-18: 10 mg via INTRAVENOUS
  Filled 2012-05-18: qty 1

## 2012-05-18 MED ORDER — HALOPERIDOL 2 MG PO TABS: 2.0000 mg | ORAL_TABLET | Freq: Two times a day (BID) | ORAL | Status: DC

## 2012-05-18 NOTE — ED Provider Notes (Signed)
History     CSN: [id number]  Arrival date & time 05/18/12  0047   First MD Initiated Contact with Patient 05/18/12 0105      Chief Complaint  Patient presents with  . Headache  . Anxiety   HPI [name] is a 37 y.o. female with a history of migraine headaches who presents to the emergency department with a migraine headache. She says this is typical of her migraines, she's had recurrent headaches over the course of the last week.  She says she had a seizure on April 2 and since then has been having recurrent migraine headaches. She's had a couple of migraine headaches the been on the right side and today's on the left side is the same character and same intensity it is throbbing, severe associated with photophobia and nausea. She has a little bit of tingling on the left side of her face. She has no dysarthria, no weakness no other numbness or tingling. No history of a stiff neck, no fevers or chills, no recent upper respiratory infection, sinus infection. No shortness of breath, chest pain. Patient was just seen yesterday at [facility] and had a CAT scan of her head which was negative per the patient. Patient has been seen by a neurologist-she is been taking Imitrex but this failed to abort this headache.   Past Medical History  Diagnosis Date  . Seizures   . Narcolepsy   . Restless leg   . Depression   . Hyperlipidemia   . Allergic rhinitis   . Narcolepsy   . Diabetes mellitus without complication     Past Surgical History  Procedure Laterality Date  . Partial hysterectomy  2006  . Appendectomy  2006  . Tubal ligation  2002  . Appendectomy  2006  . Abdominal hysterectomy      Family History  Problem Relation Age of Onset  . Heart attack Mother   . Diabetes Father   . Heart attack Father     History  Substance Use Topics  . Smoking status: Never Smoker   . Smokeless tobacco: Never Used  . Alcohol Use: No    OB History   Grav Para Term Preterm Abortions TAB SAB  Ect Mult Living                  Review of Systems At least 10pt or greater review of systems completed and are negative except where specified in the HPI.  Allergies  Eggs or egg-derived products; Metoclopramide hcl; Penicillins; and Sulfa antibiotics  Home Medications   Current Outpatient Rx  Name  Route  Sig  Dispense  Refill  . Armodafinil (NUVIGIL) 250 MG tablet      Take 1-2 tablets by mouth every morning.         . clonazePAM (KLONOPIN) 1 MG tablet   Oral   Take 1 tablet (1 mg total) by mouth 2 (two) times daily as needed for anxiety.   60 tablet   0   . estradiol (ESTRACE) 1 MG tablet   Oral   Take 1 mg by mouth daily.         . Levetiracetam (KEPPRA XR) 750 MG TB24   Oral   Take 2 tablets (1,500 mg total) by mouth 2 (two) times daily.   120 tablet   12   . metFORMIN (GLUCOPHAGE) 500 MG tablet   Oral   Take 1 tablet (500 mg total) by mouth 2 (two) times daily with a meal.  60 tablet   2   . omeprazole (PRILOSEC) 20 MG capsule   Oral   Take 2 capsules (40 mg total) by mouth daily.   60 capsule   5   . ondansetron (ZOFRAN) 4 MG tablet      TAKE ONE TABLET BY MOUTH EVERY 6 HOURS AS NEEDED FOR NAUSEA/VOMITING   30 tablet   1   . oxyCODONE-acetaminophen (PERCOCET) 5-325 MG per tablet   Oral   Take 1-2 tablets by mouth every 6 (six) hours as needed for pain.   20 tablet   0   . rOPINIRole (REQUIP) 3 MG tablet      TAKE ONE TABLET BY MOUTH AT BEDTIME   30 tablet   2   . Vilazodone HCl (VIIBRYD) 40 MG TABS   Oral   Take 1 tablet (40 mg total) by mouth daily.   90 tablet   0   . zolpidem (AMBIEN) 5 MG tablet   Oral   Take 1 tablet (5 mg total) by mouth at bedtime as needed for sleep.   15 tablet   0   . haloperidol (HALDOL) 2 MG tablet   Oral   Take 1 tablet (2 mg total) by mouth 2 (two) times daily. For severe headache   10 tablet   0   . SUMAtriptan (IMITREX) 100 MG tablet      Take 1/2 to 1 tablet by mouth at the onset of the  HA. May repeat x 1 in 2 hours if needed. Do not exceed 2 in 24 hours.   9 tablet   5     BP 102/74  Pulse 64  Temp(Src) 98.3 F (36.8 C) (Oral)  Resp 16  SpO2 100%  Physical Exam  Nursing notes reviewed.  Electronic medical record reviewed. VITAL SIGNS:   Filed Vitals:   05/18/12 0037  BP: 102/74  Pulse: 64  Temp: 98.3 F (36.8 C)  TempSrc: Oral  Resp: 16  SpO2: 100%   CONSTITUTIONAL: Awake, oriented, appears non-toxic HENT: Atraumatic, normocephalic, oral mucosa pink and moist, airway patent. Nares patent without drainage. External ears normal. EYES: Conjunctiva clear, EOMI, PERRLA NECK: Trachea midline, non-tender, supple CARDIOVASCULAR: Normal heart rate, Normal rhythm, No murmurs, rubs, gallops PULMONARY/CHEST: Clear to auscultation, no rhonchi, wheezes, or rales. Symmetrical breath sounds. Non-tender. ABDOMINAL: Non-distended, soft, non-tender - no rebound or guarding.  BS normal. NEUROLOGIC: Facial sensation equal to light touch bilaterally, slight numbness near the frontal left forehead.  Good muscle bulk in the masseter muscle and good lateral movement of the jaw.  Facial expressions equal and good strength with smile/frown and puffed cheeks.  Hearing grossly intact to finger rub test.  Uvula, tongue are midline with no deviation. Symmetrical palate elevation.  Trapezius and SCM muscles are 5/5 strength bilaterally.   DTR: Brachioradialis, biceps, patellar tendon reflexes 1+ bilaterally.  No clonus. Strength: 5/5 strength flexors and extensors in the upper and lower extremities.  Grip strength, finger adduction/abduction 5/5. Sensation: Sensation intact distally to light touch EXTREMITIES: No clubbing, cyanosis, or edema SKIN: Warm, Dry, No erythema, No rash  ED Course  Procedures (including critical care time)  Labs Reviewed - No data to display No results found.   1. Migraine       MDM  [name] is a 37 y.o. female has had a rough couple weeks with  headaches following a seizure-she has epilepsy, she's taking her medicine, has had no further seizures since her breakthrough seizure. She had imaging  yesterday. Her neurologic exam is normal today - with the exception of a small paresthesia is where she is having pain which is not unusual for migraine headaches.    will give the patient medication cocktail to alleviate headache.  Patient's headache did switch sides, but does involve the frontal region, I do not think this represents a subarachnoid hemorrhage, do not think she's got a central nervous system infection either.  Patient reassessed, she's sleeping soundly, we'll leave her sleep and reassess later.  05/18/2012 7:06 AM Patient is awake, alert, oriented, she looks much better, her headache pain is minimal at this time, she is amenable to discharge, she will followup with her neurologist to discuss further treatment options, we'll give her a short prescription of Haldol since that seems to be helping to abort her headaches.           Jones Skene, MD 05/18/12 [phone]

## 2012-05-18 NOTE — ED Notes (Signed)
Patient is alert and oriented x3.  She is complaining of headaches with anxiety. She denies any nausea.  Patient has a history of seizures and recently Dx diabetes.

## 2012-05-18 NOTE — ED Notes (Signed)
Patient has a history of epilepsy and states that she does have seizures in her sleep.  She had a seizure in April and has a headache on the right side of her head since then.  She currently rates her pain 10 of 10.  She has had nausea and has taken zofran today.  Additional complaint of photosensitivity.

## 2012-05-18 NOTE — ED Notes (Signed)
[id number]<JY> Expected date:<BR> Expected time:<BR> Means of arrival:<BR> Comments:<BR> EMS

## 2012-05-19 ENCOUNTER — Ambulatory Visit (INDEPENDENT_AMBULATORY_CARE_PROVIDER_SITE_OTHER): Payer: Medicaid Other | Admitting: Family

## 2012-05-19 ENCOUNTER — Encounter: Payer: Self-pay | Admitting: Family

## 2012-05-19 VITALS — BP 110/80 | HR 87 | Temp 98.0°F | Resp 16 | Ht 65.0 in | Wt 200.0 lb

## 2012-05-19 DIAGNOSIS — G43909 Migraine, unspecified, not intractable, without status migrainosus: Secondary | ICD-10-CM

## 2012-05-19 DIAGNOSIS — R569 Unspecified convulsions: Secondary | ICD-10-CM

## 2012-05-19 DIAGNOSIS — F329 Major depressive disorder, single episode, unspecified: Secondary | ICD-10-CM

## 2012-05-19 MED ORDER — HALOPERIDOL 2 MG PO TABS: 2.0000 mg | ORAL_TABLET | Freq: Two times a day (BID) | ORAL | Status: DC

## 2012-05-19 MED ORDER — KETOROLAC TROMETHAMINE 30 MG/ML IJ SOLN
30.0000 mg | Freq: Once | INTRAMUSCULAR | Status: AC
  Administered 2012-05-19: 30 mg via INTRAMUSCULAR

## 2012-05-19 NOTE — Assessment & Plan Note (Addendum)
On viibryd.  Headaches have been uncontrolled and she feels this has worsened her depression.  Psychiatry would not see her because her insurance is medicaid. Plan to continue viibryd for now.  Will re-assess after headaches are under better control.

## 2012-05-19 NOTE — Assessment & Plan Note (Signed)
Uncontrolled. Will refill haldol.  Toradol given today in office.  Pt instructed to arrange sooner follow up with Dr. Smiley Houseman.

## 2012-05-19 NOTE — Patient Instructions (Addendum)
Please call and arrange an earlier follow up visit with Dr. Smiley Houseman. Follow up here in 1 month.

## 2012-05-19 NOTE — Progress Notes (Signed)
Subjective:    Patient ID: [name], female    DOB: Jun 07, 1975, 37 y.o.   MRN: [id number]  HPI  [name] is a 37 yr old female who presents today for follow up of multiple medical problems.  1) Depression- reports that [name] would not see her because she has medicaid. She continues viibryd, but notes that depression is still not optimally controlled due to migraine.    2) Migraine- Has had several ED visits for Migraine. Reports that last seizure 4/2- the following day she started to have migraines.  HA is located on the right side of her head and are associated with nausea, photophobia and phonophobia. Denies vomitting.  Reports that the last time that she went to [facility] she was given rx for Haldol.  Reports that nothing has made it "completely go away."  Haldol helped some but she out. Has tried imitrex without improvement.    3) Seizure disorder- Last seizure was April 2nd. She saw Dr. Smiley Houseman and he recommended klonopin.      Review of Systems See HPI  Past Medical History  Diagnosis Date  . Seizures   . Narcolepsy   . Restless leg   . Depression   . Hyperlipidemia   . Allergic rhinitis   . Narcolepsy   . Diabetes mellitus without complication     History   Social History  . Marital Status: Married    Spouse Name: N/A    Number of Children: 2  . Years of Education: N/A   Occupational History  . Unemployed- full time student     Completed 12th grade   Social History Main Topics  . Smoking status: Never Smoker   . Smokeless tobacco: Never Used  . Alcohol Use: No  . Drug Use: No  . Sexually Active: Not on file   Other Topics Concern  . Not on file   Social History Narrative  . No narrative on file    Past Surgical History  Procedure Laterality Date  . Partial hysterectomy  2006  . Appendectomy  2006  . Tubal ligation  2002  . Appendectomy  2006  . Abdominal hysterectomy      Family History  Problem Relation Age of Onset  . Heart  attack Mother   . Diabetes Father   . Heart attack Father     Allergies  Allergen Reactions  . Eggs Or Egg-Derived Products Nausea And Vomiting  . Metoclopramide Hcl Other (See Comments)    Intensifies restless leg  . Penicillins Itching  . Sulfa Antibiotics     Tongue swelling    Current Outpatient Prescriptions on File Prior to Visit  Medication Sig Dispense Refill  . Armodafinil (NUVIGIL) 250 MG tablet Take 1-2 tablets by mouth every morning.      . clonazePAM (KLONOPIN) 1 MG tablet Take 1 tablet (1 mg total) by mouth 2 (two) times daily as needed for anxiety.  60 tablet  0  . estradiol (ESTRACE) 1 MG tablet Take 1 mg by mouth daily.      . haloperidol (HALDOL) 2 MG tablet Take 1 tablet (2 mg total) by mouth 2 (two) times daily. For severe headache  10 tablet  0  . Levetiracetam (KEPPRA XR) 750 MG TB24 Take 2 tablets (1,500 mg total) by mouth 2 (two) times daily.  120 tablet  12  . metFORMIN (GLUCOPHAGE) 500 MG tablet Take 1 tablet (500 mg total) by mouth 2 (two) times daily with a meal.  60 tablet  2  . omeprazole (PRILOSEC) 20 MG capsule Take 2 capsules (40 mg total) by mouth daily.  60 capsule  5  . ondansetron (ZOFRAN) 4 MG tablet TAKE ONE TABLET BY MOUTH EVERY 6 HOURS AS NEEDED FOR NAUSEA/VOMITING  30 tablet  1  . rOPINIRole (REQUIP) 3 MG tablet TAKE ONE TABLET BY MOUTH AT BEDTIME  30 tablet  2  . SUMAtriptan (IMITREX) 100 MG tablet Take 1/2 to 1 tablet by mouth at the onset of the HA. May repeat x 1 in 2 hours if needed. Do not exceed 2 in 24 hours.  9 tablet  5  . Vilazodone HCl (VIIBRYD) 40 MG TABS Take 1 tablet (40 mg total) by mouth daily.  90 tablet  0  . zolpidem (AMBIEN) 5 MG tablet Take 1 tablet (5 mg total) by mouth at bedtime as needed for sleep.  15 tablet  0  . [DISCONTINUED] desvenlafaxine (PRISTIQ) 100 MG 24 hr tablet Take 100 mg by mouth daily.        . [DISCONTINUED] gabapentin (NEURONTIN) 300 MG capsule Take 300 mg by mouth 4 (four) times daily.         No  current facility-administered medications on file prior to visit.    BP 110/80  Pulse 87  Temp(Src) 98 F (36.7 C) (Oral)  Resp 16  Ht 5\' 5"  (1.651 m)  Wt 200 lb (90.719 kg)  BMI 33.28 kg/m2  SpO2 99%       Objective:   Physical Exam  Constitutional: She is oriented to person, place, and time.  Awake, alert, laying on exam table with eyes closed, crying.  Cardiovascular: Normal rate and regular rhythm.   No murmur heard. Pulmonary/Chest: Effort normal and breath sounds normal. No respiratory distress. She has no wheezes. She has no rales. She exhibits no tenderness.  Neurological: She is alert and oriented to person, place, and time.          Assessment & Plan:

## 2012-05-19 NOTE — Assessment & Plan Note (Signed)
Improved control on Keppra XR.  Management per Neurology.

## 2012-05-20 ENCOUNTER — Telehealth: Payer: Self-pay | Admitting: *Deleted

## 2012-05-20 ENCOUNTER — Emergency Department (HOSPITAL_COMMUNITY)
Admission: EM | Admit: 2012-05-20 | Discharge: 2012-05-21 | Disposition: A | Discharge status: Home / Self Care | Payer: Medicaid Other | Attending: Emergency Medicine | Admitting: Emergency Medicine

## 2012-05-20 ENCOUNTER — Encounter (HOSPITAL_COMMUNITY): Payer: Self-pay | Admitting: *Deleted

## 2012-05-20 DIAGNOSIS — H539 Unspecified visual disturbance: Secondary | ICD-10-CM | POA: Insufficient documentation

## 2012-05-20 DIAGNOSIS — E785 Hyperlipidemia, unspecified: Secondary | ICD-10-CM | POA: Insufficient documentation

## 2012-05-20 DIAGNOSIS — R110 Nausea: Secondary | ICD-10-CM | POA: Insufficient documentation

## 2012-05-20 DIAGNOSIS — F3289 Other specified depressive episodes: Secondary | ICD-10-CM | POA: Insufficient documentation

## 2012-05-20 DIAGNOSIS — Z8669 Personal history of other diseases of the nervous system and sense organs: Secondary | ICD-10-CM | POA: Insufficient documentation

## 2012-05-20 DIAGNOSIS — G40909 Epilepsy, unspecified, not intractable, without status epilepticus: Secondary | ICD-10-CM | POA: Insufficient documentation

## 2012-05-20 DIAGNOSIS — F329 Major depressive disorder, single episode, unspecified: Secondary | ICD-10-CM | POA: Insufficient documentation

## 2012-05-20 DIAGNOSIS — Z8709 Personal history of other diseases of the respiratory system: Secondary | ICD-10-CM | POA: Insufficient documentation

## 2012-05-20 DIAGNOSIS — E119 Type 2 diabetes mellitus without complications: Secondary | ICD-10-CM | POA: Insufficient documentation

## 2012-05-20 DIAGNOSIS — Z8739 Personal history of other diseases of the musculoskeletal system and connective tissue: Secondary | ICD-10-CM | POA: Insufficient documentation

## 2012-05-20 DIAGNOSIS — F341 Dysthymic disorder: Secondary | ICD-10-CM | POA: Insufficient documentation

## 2012-05-20 DIAGNOSIS — Z79899 Other long term (current) drug therapy: Secondary | ICD-10-CM | POA: Insufficient documentation

## 2012-05-20 DIAGNOSIS — R569 Unspecified convulsions: Secondary | ICD-10-CM

## 2012-05-20 DIAGNOSIS — G43909 Migraine, unspecified, not intractable, without status migrainosus: Secondary | ICD-10-CM | POA: Insufficient documentation

## 2012-05-20 MED ORDER — ONDANSETRON HCL 4 MG/2ML IJ SOLN
4.0000 mg | Freq: Once | INTRAMUSCULAR | Status: AC
  Administered 2012-05-20: 4 mg via INTRAVENOUS
  Filled 2012-05-20: qty 2

## 2012-05-20 MED ORDER — KETOROLAC TROMETHAMINE 30 MG/ML IJ SOLN
30.0000 mg | Freq: Once | INTRAMUSCULAR | Status: AC
  Administered 2012-05-21: 30 mg via INTRAVENOUS
  Filled 2012-05-20: qty 1

## 2012-05-20 MED ORDER — DIPHENHYDRAMINE HCL 50 MG/ML IJ SOLN
25.0000 mg | Freq: Once | INTRAMUSCULAR | Status: AC
  Administered 2012-05-21: 25 mg via INTRAVENOUS
  Filled 2012-05-20: qty 1

## 2012-05-20 NOTE — Telephone Encounter (Signed)
Patient's spouse called RE: disability letter for patient, stating that previous letter "ran out on 04.11.14" and pt "forgot to get new letter" when seen in office on Mon, 04.14.14/SLS Please advise.

## 2012-05-20 NOTE — ED Notes (Signed)
Pt states she had a seizure on 4/2, since then has had a migraine, been to ER 5 times d/t migraine.

## 2012-05-20 NOTE — ED Provider Notes (Signed)
History     CSN: [id number]  Arrival date & time 05/20/12  2135   First MD Initiated Contact with Patient 05/20/12 2252      Chief Complaint  Patient presents with  . Migraine    (Consider location/radiation/quality/duration/timing/severity/associated sxs/prior treatment) Patient is a 37 y.o. female presenting with migraines. The history is provided by the patient and medical records. No language interpreter was used.  Migraine This is a recurrent problem. The current episode started in the past 7 days. The problem occurs constantly. The problem has been gradually worsening. Associated symptoms include headaches, nausea and a visual change. Pertinent negatives include no abdominal pain, anorexia, arthralgias, change in bowel habit, chest pain, chills, congestion, coughing, diaphoresis, fatigue, fever, joint swelling, myalgias, neck pain, numbness, rash, sore throat, swollen glands, urinary symptoms, vertigo, vomiting or weakness. The symptoms are aggravated by coughing and bending. She has tried lying down (Imitrex) for the symptoms. The treatment provided no relief.    [name] is a 37 y.o. female  with a hx of migraine HA and seizure disorder (last breakthrough seizure on 05/07/12) presents to the Emergency Department complaining of gradual, persistent, progressively worsening headache, located on the right side of her head onset 2 days ago after being seen in the ER and treated.  Pt states pain returned when medication wore off and has been persistent and unchanging since then. Pt states she took her last Imitrex today, which did not help and will be unable to see her neurologist this week as he is out of town.  Associated symptoms include photophobia and phonophobia, nausea, mild vision changes (blurry) which is consistent with previous migraines and right sided neck muscle spasm.  Nothing makes it better and light, sound makes it worse.  Pt denies fever, chills, neck pain/stiffness, chest  pain, SOB, abdominal pain, V/D, weakness, numbness, tingling, syncope, seizure, dysuria, hematuria, difficulty walking or talking.     Past Medical History  Diagnosis Date  . Seizures   . Narcolepsy   . Restless leg   . Depression   . Hyperlipidemia   . Allergic rhinitis   . Narcolepsy   . Diabetes mellitus without complication     Past Surgical History  Procedure Laterality Date  . Partial hysterectomy  2006  . Appendectomy  2006  . Tubal ligation  2002  . Appendectomy  2006  . Abdominal hysterectomy      Family History  Problem Relation Age of Onset  . Heart attack Mother   . Diabetes Father   . Heart attack Father     History  Substance Use Topics  . Smoking status: Never Smoker   . Smokeless tobacco: Never Used  . Alcohol Use: No    OB History   Grav Para Term Preterm Abortions TAB SAB Ect Mult Living                  Review of Systems  Constitutional: Negative for fever, chills, diaphoresis, appetite change, fatigue and unexpected weight change.  HENT: Negative for congestion, sore throat, mouth sores, neck pain and neck stiffness.   Eyes: Positive for visual disturbance (mild, blurry).  Respiratory: Negative for cough, chest tightness, shortness of breath and wheezing.   Cardiovascular: Negative for chest pain.  Gastrointestinal: Positive for nausea. Negative for vomiting, abdominal pain, diarrhea, constipation, anorexia and change in bowel habit.  Endocrine: Negative for polydipsia, polyphagia and polyuria.  Genitourinary: Negative for dysuria, urgency, frequency and hematuria.  Musculoskeletal: Negative for myalgias, back pain,  joint swelling and arthralgias.  Skin: Negative for rash.  Allergic/Immunologic: Negative for immunocompromised state.  Neurological: Positive for headaches. Negative for vertigo, tremors, seizures, syncope, speech difficulty, weakness, light-headedness and numbness.  Hematological: Does not bruise/bleed easily.    Psychiatric/Behavioral: Negative for sleep disturbance. The patient is not nervous/anxious.     Allergies  Sulfa antibiotics; Eggs or egg-derived products; Metoclopramide hcl; and Penicillins  Home Medications   Current Outpatient Rx  Name  Route  Sig  Dispense  Refill  . butalbital-acetaminophen-caffeine (FIORICET, ESGIC) 50-325-40 MG per tablet   Oral   Take 1 tablet by mouth 2 (two) times daily as needed for headache.         . clonazePAM (KLONOPIN) 1 MG tablet   Oral   Take 1 tablet (1 mg total) by mouth 2 (two) times daily as needed for anxiety.   60 tablet   0   . estradiol (ESTRACE) 1 MG tablet   Oral   Take 1 mg by mouth every morning.          . haloperidol (HALDOL) 2 MG tablet   Oral   Take 2 mg by mouth 2 (two) times daily as needed (for severe headache). For severe headache         . Levetiracetam (KEPPRA XR) 750 MG TB24   Oral   Take 2 tablets (1,500 mg total) by mouth 2 (two) times daily.   120 tablet   12   . metFORMIN (GLUCOPHAGE) 500 MG tablet   Oral   Take 1 tablet (500 mg total) by mouth 2 (two) times daily with a meal.   60 tablet   2   . omeprazole (PRILOSEC) 20 MG capsule   Oral   Take 2 capsules (40 mg total) by mouth daily.   60 capsule   5   . ondansetron (ZOFRAN) 4 MG tablet   Oral   Take 4 mg by mouth every 6 (six) hours as needed for nausea.         Marland Kitchen rOPINIRole (REQUIP) 3 MG tablet   Oral   Take 3 mg by mouth at bedtime.         . SUMAtriptan (IMITREX) 100 MG tablet   Oral   Take 50-100 mg by mouth 2 (two) times daily as needed for migraine. Take 1/2 to 1 tablet by mouth at the onset of the HA. May repeat x 1 in 2 hours if needed. Do not exceed 2 in 24 hours.         . Vilazodone HCl (VIIBRYD) 40 MG TABS   Oral   Take 40 mg by mouth every morning.         . zolpidem (AMBIEN) 5 MG tablet   Oral   Take 5 mg by mouth at bedtime as needed for sleep.         . diazepam (VALIUM) 5 MG tablet   Oral   Take 2  tablets (10 mg total) by mouth every 6 (six) hours as needed (headache).   10 tablet   0     BP 133/82  Pulse 93  Temp(Src) 97.9 F (36.6 C) (Oral)  Resp 18  SpO2 99%  Physical Exam  Nursing note and vitals reviewed. Constitutional: She is oriented to person, place, and time. She appears well-developed and well-nourished. No distress.  HENT:  Head: Normocephalic and atraumatic.  Mouth/Throat: Oropharynx is clear and moist.  Eyes: Conjunctivae and EOM are normal. Pupils are equal, round, and reactive  to light. No scleral icterus.  Neck: Normal range of motion. Neck supple. Muscular tenderness (right side paraspinal) present. No spinous process tenderness present. No rigidity. Normal range of motion present. No Brudzinski's sign and no Kernig's sign noted.    Cardiovascular: Normal rate, regular rhythm, normal heart sounds and intact distal pulses.  Exam reveals no gallop and no friction rub.   No murmur heard. Pulmonary/Chest: Effort normal and breath sounds normal. No respiratory distress. She has no wheezes. She has no rales. She exhibits no tenderness.  Abdominal: Soft. Bowel sounds are normal. She exhibits no distension. There is no tenderness. There is no rebound and no guarding.  Musculoskeletal: Normal range of motion. She exhibits no edema and no tenderness.  Lymphadenopathy:    She has no cervical adenopathy.  Neurological: She is alert and oriented to person, place, and time. She has normal reflexes. No cranial nerve deficit. She exhibits normal muscle tone. Coordination normal.  Speech is clear and goal oriented, follows commands Cranial nerves III - XII without deficit, no facial droop Normal strength in upper and lower extremities bilaterally, strong and equal grip strength Sensation normal to light and sharp touch Moves extremities without ataxia, coordination intact Normal finger to nose and rapid alternating movements Neg romberg, no pronator drift Normal gait and  balance  Skin: Skin is warm and dry. No rash noted. She is not diaphoretic. No erythema.  Psychiatric: She has a normal mood and affect. Her behavior is normal. Judgment and thought content normal.    ED Course  Procedures (including critical care time)  Labs Reviewed - No data to display No results found.   1. Migraine   2. SEIZURE DISORDER   3. DEPRESSION/ANXIETY       MDM  [name] presents with migraine headache persistent after her seizure on 4/2.  Pt HA treated and improved while in ED.  Presentation is like pts typical HA and non concerning for [facility], ICH, Meningitis, or temporal arteritis. Pt is afebrile with no focal neuro deficits or nuchal rigidity.  Changes in vision resolved with headache improvement. Pt is to follow up with neurologist to discuss further prophylactic medication options. Will d/c home with valium for muscle spasm in neck. Pt verbalizes understanding and is agreeable with plan to dc.          Dahlia Client Jarah Pember, PA-C 05/21/12 [phone]

## 2012-05-21 MED ORDER — DIAZEPAM 5 MG/ML IJ SOLN
5.0000 mg | Freq: Once | INTRAMUSCULAR | Status: AC
  Administered 2012-05-21: 5 mg via INTRAVENOUS
  Filled 2012-05-21: qty 2

## 2012-05-21 MED ORDER — DIAZEPAM 5 MG PO TABS: 10.0000 mg | ORAL_TABLET | Freq: Four times a day (QID) | ORAL | Status: DC | PRN

## 2012-05-21 NOTE — Telephone Encounter (Signed)
Pt left message requesting extension of leave of absence from work for "whatever time we think is sufficient". Requests that we include her Case# [id number] [id number] and name on letter and fax it to [facility] at [phone]. Pt also states that she has another migraine and went to the ER yesterday. Was given rx for xanax but did not fill it yet as nothing has been helping and she is tired of wasting money. Wants to know what else she can do since Dr Smiley Houseman is out of the office until next week?  Please advise.

## 2012-05-21 NOTE — ED Provider Notes (Signed)
Medical screening examination/treatment/procedure(s) were performed by non-physician practitioner and as supervising physician I was immediately available for consultation/collaboration.   Kinda Pottle L Royale Swamy, MD 05/21/12 1038 

## 2012-05-22 ENCOUNTER — Encounter: Payer: Self-pay | Admitting: Family

## 2012-05-22 MED ORDER — SUMATRIPTAN SUCCINATE 100 MG PO TABS: 50.0000 mg | ORAL_TABLET | Freq: Two times a day (BID) | ORAL | Status: DC | PRN

## 2012-05-22 NOTE — Telephone Encounter (Signed)
Unfortunately, I do no have any further recommendations for her headache. OK to send imitrex refill if needed and they follow up with Dr. Smiley Houseman next week.  See letter.

## 2012-05-22 NOTE — Telephone Encounter (Signed)
Caller [pt's spouse] informed of provider response; Ok to send Imitrex to pharmacy, will p/u letter [reminded office closed tomorrow 04.18.14 for Holiday]/SLS

## 2012-05-25 ENCOUNTER — Encounter (HOSPITAL_COMMUNITY): Payer: Self-pay | Admitting: *Deleted

## 2012-05-25 ENCOUNTER — Emergency Department (HOSPITAL_COMMUNITY)
Admission: EM | Admit: 2012-05-25 | Discharge: 2012-05-25 | Disposition: A | Discharge status: Home / Self Care | Payer: Medicaid Other | Attending: Emergency Medicine | Admitting: Emergency Medicine

## 2012-05-25 DIAGNOSIS — Z8659 Personal history of other mental and behavioral disorders: Secondary | ICD-10-CM | POA: Insufficient documentation

## 2012-05-25 DIAGNOSIS — R110 Nausea: Secondary | ICD-10-CM | POA: Insufficient documentation

## 2012-05-25 DIAGNOSIS — G40909 Epilepsy, unspecified, not intractable, without status epilepticus: Secondary | ICD-10-CM | POA: Insufficient documentation

## 2012-05-25 DIAGNOSIS — F3289 Other specified depressive episodes: Secondary | ICD-10-CM | POA: Insufficient documentation

## 2012-05-25 DIAGNOSIS — Z862 Personal history of diseases of the blood and blood-forming organs and certain disorders involving the immune mechanism: Secondary | ICD-10-CM | POA: Insufficient documentation

## 2012-05-25 DIAGNOSIS — Z8639 Personal history of other endocrine, nutritional and metabolic disease: Secondary | ICD-10-CM | POA: Insufficient documentation

## 2012-05-25 DIAGNOSIS — Z79899 Other long term (current) drug therapy: Secondary | ICD-10-CM | POA: Insufficient documentation

## 2012-05-25 DIAGNOSIS — E119 Type 2 diabetes mellitus without complications: Secondary | ICD-10-CM | POA: Insufficient documentation

## 2012-05-25 DIAGNOSIS — F329 Major depressive disorder, single episode, unspecified: Secondary | ICD-10-CM | POA: Insufficient documentation

## 2012-05-25 DIAGNOSIS — G43909 Migraine, unspecified, not intractable, without status migrainosus: Secondary | ICD-10-CM | POA: Insufficient documentation

## 2012-05-25 MED ORDER — DIPHENHYDRAMINE HCL 50 MG/ML IJ SOLN
25.0000 mg | Freq: Once | INTRAMUSCULAR | Status: AC
  Administered 2012-05-25: 25 mg via INTRAVENOUS
  Filled 2012-05-25: qty 1

## 2012-05-25 MED ORDER — KETOROLAC TROMETHAMINE 30 MG/ML IJ SOLN
30.0000 mg | Freq: Once | INTRAMUSCULAR | Status: AC
  Administered 2012-05-25: 30 mg via INTRAVENOUS
  Filled 2012-05-25: qty 1

## 2012-05-25 MED ORDER — SODIUM CHLORIDE 0.9 % IV BOLUS (SEPSIS)
1000.0000 mL | Freq: Once | INTRAVENOUS | Status: AC
  Administered 2012-05-25: 1000 mL via INTRAVENOUS

## 2012-05-25 MED ORDER — ONDANSETRON HCL 4 MG/2ML IJ SOLN
4.0000 mg | Freq: Once | INTRAMUSCULAR | Status: AC
  Administered 2012-05-25: 4 mg via INTRAVENOUS
  Filled 2012-05-25: qty 2

## 2012-05-25 NOTE — ED Provider Notes (Signed)
History     CSN: [id number]  Arrival date & time 05/25/12  4098   First MD Initiated Contact with Patient 05/25/12 0602      Chief Complaint  Patient presents with  . Migraine    (Consider location/radiation/quality/duration/timing/severity/associated sxs/prior treatment) HPI  37 year old female with history of migraine headache history of seizure presents complaining of headache. She reports having recurrent headaches since April 2nd after having a seizure episode. Headache is described as a sharp throbbing sensation to the right side of her face radiates to the back of her head, waxing waning, currently 10 out of 10, with associate nausea, light sensitivity, and sound sensitivity. Headache is similar to prior headaches. Denies fever, chills, vision changes, numbness, weakness, or rash.This is her fourth visits to the ER within the month with headache related pain.  Patient reports she has followup with a neurologist 2 weeks ago and was given Imitrex which has not helped.  Past Medical History  Diagnosis Date  . Seizures   . Narcolepsy   . Restless leg   . Depression   . Hyperlipidemia   . Allergic rhinitis   . Narcolepsy   . Diabetes mellitus without complication   . Migraine     Past Surgical History  Procedure Laterality Date  . Partial hysterectomy  2006  . Appendectomy  2006  . Tubal ligation  2002  . Appendectomy  2006  . Abdominal hysterectomy      Family History  Problem Relation Age of Onset  . Heart attack Mother   . Diabetes Father   . Heart attack Father     History  Substance Use Topics  . Smoking status: Never Smoker   . Smokeless tobacco: Never Used  . Alcohol Use: No    OB History   Grav Para Term Preterm Abortions TAB SAB Ect Mult Living                  Review of Systems  Constitutional:       10 Systems reviewed and all are negative for acute change except as noted in the HPI.     Allergies  Sulfa antibiotics; Eggs or egg-derived  products; Metoclopramide hcl; and Penicillins  Home Medications   Current Outpatient Rx  Name  Route  Sig  Dispense  Refill  . butalbital-acetaminophen-caffeine (FIORICET, ESGIC) 50-325-40 MG per tablet   Oral   Take 1 tablet by mouth 2 (two) times daily as needed for headache.         . clonazePAM (KLONOPIN) 1 MG tablet   Oral   Take 1 tablet (1 mg total) by mouth 2 (two) times daily as needed for anxiety.   60 tablet   0   . diazepam (VALIUM) 5 MG tablet   Oral   Take 10 mg by mouth every 6 (six) hours as needed (headache).         . estradiol (ESTRACE) 1 MG tablet   Oral   Take 1 mg by mouth every evening.          . haloperidol (HALDOL) 2 MG tablet   Oral   Take 2 mg by mouth 2 (two) times daily as needed (for severe headache). For severe headache         . Levetiracetam (KEPPRA XR) 750 MG TB24   Oral   Take 2 tablets (1,500 mg total) by mouth 2 (two) times daily.   120 tablet   12   . metFORMIN (GLUCOPHAGE) 500 MG  tablet   Oral   Take 1 tablet (500 mg total) by mouth 2 (two) times daily with a meal.   60 tablet   2   . omeprazole (PRILOSEC) 20 MG capsule   Oral   Take 2 capsules (40 mg total) by mouth daily.   60 capsule   5   . ondansetron (ZOFRAN) 4 MG tablet   Oral   Take 4 mg by mouth every 6 (six) hours as needed for nausea.         Marland Kitchen rOPINIRole (REQUIP) 3 MG tablet   Oral   Take 3 mg by mouth at bedtime.         . SUMAtriptan (IMITREX) 100 MG tablet   Oral   Take 0.5-1 tablets (50-100 mg total) by mouth 2 (two) times daily as needed for migraine. Take 1/2 to 1 tablet by mouth at the onset of the HA. May repeat x 1 in 2 hours if needed. Do not exceed 2 in 24 hours.   10 tablet   0   . Vilazodone HCl (VIIBRYD) 40 MG TABS   Oral   Take 40 mg by mouth every morning.         . zolpidem (AMBIEN) 5 MG tablet   Oral   Take 5 mg by mouth at bedtime as needed for sleep.           BP 143/90  Pulse 77  Temp(Src) 98.9 F (37.2 C)  (Oral)  Resp 18  SpO2 100%  Physical Exam  Nursing note and vitals reviewed. Constitutional: She is oriented to person, place, and time. She appears well-developed and well-nourished. No distress.  Tearful, but Awake, alert, nontoxic appearance  HENT:  Head: Atraumatic.  Right Ear: External ear normal.  Left Ear: External ear normal.  Mouth/Throat: Oropharynx is clear and moist.  Eyes: Conjunctivae and EOM are normal. Pupils are equal, round, and reactive to light. Right eye exhibits no discharge. Left eye exhibits no discharge.  Neck: Normal range of motion. Neck supple.  No nuchal rigidity, no meningismal sign  Cardiovascular: Normal rate and regular rhythm.   Pulmonary/Chest: Effort normal. No respiratory distress. She exhibits no tenderness.  Abdominal: Soft. There is no tenderness. There is no rebound.  Musculoskeletal: She exhibits no tenderness.  ROM appears intact, no obvious focal weakness  Neurological: She is alert and oriented to person, place, and time. She has normal strength. No cranial nerve deficit or sensory deficit. GCS eye subscore is 4. GCS verbal subscore is 5. GCS motor subscore is 6.  Mental status and motor strength appears intact  Skin: No rash noted.  Psychiatric: She has a normal mood and affect.    ED Course  Procedures (including critical care time)  6:21 AM Patient is here with recurrent migraine headache, no red flags. This is her fourth visits within the past month. She is currently afebrile with stable normal vital sign. I strongly urged patient to followup with a neurologist for further management of her headache. We'll give patient migraine cocktail.  9:17 AM Patient sleeping peacefully from migraine cocktail.  Pt stable for discharge once awake.    Labs Reviewed - No data to display No results found.   1. Migraine       MDM  BP 143/90  Pulse 77  Temp(Src) 98.9 F (37.2 C) (Oral)  Resp 18  SpO2 100%  I have reviewed nursing  notes and vital signs.   I reviewed available ER/hospitalization records thought the  EMR         Fayrene Helper, PA-C 05/25/12 [phone]

## 2012-05-25 NOTE — ED Provider Notes (Signed)
Medical screening examination/treatment/procedure(s) were performed by non-physician practitioner and as supervising physician I was immediately available for consultation/collaboration.  Olivia Mackie, MD 05/25/12 2147

## 2012-05-25 NOTE — ED Notes (Signed)
Recurrent Migraines since April 2nd. States migraines have been getting worse. She had a headache and nausea since yesterday. Pt states she has PMH of migraines in 2006 post hysterectomy. Hasnt had migraines until now.

## 2012-05-28 ENCOUNTER — Telehealth: Payer: Self-pay | Admitting: *Deleted

## 2012-05-28 NOTE — Telephone Encounter (Signed)
Received message from pt wanting to know if her work letter had been faxed to [employer]. Spoke to pt and advised her per 05/20/12 phone note that letter was placed at front desk for pick up. Pt requests that we fax letter to [employer] at [phone] and mail copy to her as well. Letter faxed and mailed.

## 2012-06-02 ENCOUNTER — Other Ambulatory Visit: Payer: Self-pay | Admitting: Family

## 2012-06-02 NOTE — Telephone Encounter (Signed)
Rx request to pharmacy/[facility]  

## 2012-06-06 ENCOUNTER — Encounter (HOSPITAL_COMMUNITY): Payer: Self-pay | Admitting: *Deleted

## 2012-06-06 ENCOUNTER — Emergency Department (HOSPITAL_COMMUNITY)
Admission: EM | Admit: 2012-06-06 | Discharge: 2012-06-06 | Disposition: A | Discharge status: Home / Self Care | Payer: Self-pay | Attending: Emergency Medicine | Admitting: Emergency Medicine

## 2012-06-06 DIAGNOSIS — F329 Major depressive disorder, single episode, unspecified: Secondary | ICD-10-CM | POA: Insufficient documentation

## 2012-06-06 DIAGNOSIS — G40309 Generalized idiopathic epilepsy and epileptic syndromes, not intractable, without status epilepticus: Secondary | ICD-10-CM | POA: Insufficient documentation

## 2012-06-06 DIAGNOSIS — R569 Unspecified convulsions: Secondary | ICD-10-CM

## 2012-06-06 DIAGNOSIS — Z79899 Other long term (current) drug therapy: Secondary | ICD-10-CM | POA: Insufficient documentation

## 2012-06-06 DIAGNOSIS — Z8669 Personal history of other diseases of the nervous system and sense organs: Secondary | ICD-10-CM | POA: Insufficient documentation

## 2012-06-06 DIAGNOSIS — Z8739 Personal history of other diseases of the musculoskeletal system and connective tissue: Secondary | ICD-10-CM | POA: Insufficient documentation

## 2012-06-06 DIAGNOSIS — E785 Hyperlipidemia, unspecified: Secondary | ICD-10-CM | POA: Insufficient documentation

## 2012-06-06 DIAGNOSIS — G43909 Migraine, unspecified, not intractable, without status migrainosus: Secondary | ICD-10-CM | POA: Insufficient documentation

## 2012-06-06 DIAGNOSIS — Z8709 Personal history of other diseases of the respiratory system: Secondary | ICD-10-CM | POA: Insufficient documentation

## 2012-06-06 DIAGNOSIS — F3289 Other specified depressive episodes: Secondary | ICD-10-CM | POA: Insufficient documentation

## 2012-06-06 DIAGNOSIS — E119 Type 2 diabetes mellitus without complications: Secondary | ICD-10-CM | POA: Insufficient documentation

## 2012-06-06 MED ORDER — ZOLPIDEM TARTRATE ER 6.25 MG PO TBCR: 6.2500 mg | EXTENDED_RELEASE_TABLET | Freq: Every evening | ORAL | Status: DC | PRN

## 2012-06-06 MED ORDER — HYDROCODONE-ACETAMINOPHEN 5-325 MG PO TABS
1.0000 | ORAL_TABLET | Freq: Once | ORAL | Status: AC
  Administered 2012-06-06: 1 via ORAL
  Filled 2012-06-06: qty 1

## 2012-06-06 MED ORDER — LORAZEPAM 2 MG/ML IJ SOLN
0.5000 mg | Freq: Once | INTRAMUSCULAR | Status: AC
  Administered 2012-06-06: 0.5 mg via INTRAVENOUS
  Filled 2012-06-06: qty 1

## 2012-06-06 MED ORDER — HYDROCODONE-ACETAMINOPHEN 5-325 MG PO TABS: 1.0000 | ORAL_TABLET | ORAL | Status: DC | PRN

## 2012-06-06 NOTE — ED Provider Notes (Signed)
History     CSN: [id number]  Arrival date & time 06/06/12  4098   First MD Initiated Contact with Patient 06/06/12 1846      Chief Complaint  Patient presents with  . Seizures    (Consider location/radiation/quality/duration/timing/severity/associated sxs/prior treatment) The history is provided by the patient, medical records and the spouse.   patient has a known seizure disorder.  She takes Keppra.  She's been compliant with her medications.  She had what was described as a 6-7 minute tonic-clonic seizure this evening.  She has a normal set health prior to this.  Her complaints at this time her headache and right tongue pain after tongue biting.  Family reports she is returned to baseline mental status.  She had a 15-20 minute postictal period.  The patient reports that she's been under a lot of stress and did not sleep at all last night.  She denies fevers or chills.  No neck pain.  No chest pain abdominal pain.  No nausea vomiting or diarrhea.  Seizure was witnessed by husband and daughter.  Patient has a neurologist.  She is on Keppra for seizure control  Past Medical History  Diagnosis Date  . Seizures   . Narcolepsy   . Restless leg   . Depression   . Hyperlipidemia   . Allergic rhinitis   . Narcolepsy   . Diabetes mellitus without complication   . Migraine     Past Surgical History  Procedure Laterality Date  . Partial hysterectomy  2006  . Appendectomy  2006  . Tubal ligation  2002  . Appendectomy  2006  . Abdominal hysterectomy      Family History  Problem Relation Age of Onset  . Heart attack Mother   . Diabetes Father   . Heart attack Father     History  Substance Use Topics  . Smoking status: Never Smoker   . Smokeless tobacco: Never Used  . Alcohol Use: No    OB History   Grav Para Term Preterm Abortions TAB SAB Ect Mult Living                  Review of Systems  All other systems reviewed and are negative.    Allergies  Sulfa  antibiotics; Eggs or egg-derived products; Metoclopramide hcl; and Penicillins  Home Medications   Current Outpatient Rx  Name  Route  Sig  Dispense  Refill  . Armodafinil (NUVIGIL) 250 MG tablet   Oral   Take 125-250 mg by mouth daily. Take 1/2 to 1 tablet daily         . clonazePAM (KLONOPIN) 1 MG tablet   Oral   Take 1 tablet (1 mg total) by mouth 2 (two) times daily as needed for anxiety.   60 tablet   0   . Levetiracetam (KEPPRA XR) 750 MG TB24   Oral   Take 2 tablets (1,500 mg total) by mouth 2 (two) times daily.   120 tablet   12   . metFORMIN (GLUCOPHAGE) 500 MG tablet   Oral   Take 1 tablet (500 mg total) by mouth 2 (two) times daily with a meal.   60 tablet   2   . omeprazole (PRILOSEC) 20 MG capsule      TAKE 2 CAPSULES BY MOUTH EVERY DAY   60 capsule   3   . ondansetron (ZOFRAN) 4 MG tablet   Oral   Take 4 mg by mouth every 6 (six) hours as  needed for nausea.         Marland Kitchen rOPINIRole (REQUIP) 3 MG tablet   Oral   Take 3 mg by mouth at bedtime.         Marland Kitchen HYDROcodone-acetaminophen (NORCO/VICODIN) 5-325 MG per tablet   Oral   Take 1 tablet by mouth every 4 (four) hours as needed for pain.   8 tablet   0     BP 129/86  Pulse 92  Temp(Src) 98.5 F (36.9 C) (Oral)  Resp 16  SpO2 100%  Physical Exam  Nursing note and vitals reviewed. Constitutional: She is oriented to person, place, and time. She appears well-developed and well-nourished. No distress.  HENT:  Head: Normocephalic and atraumatic.  Eyes: EOM are normal. Pupils are equal, round, and reactive to light.  Neck: Normal range of motion.  Cardiovascular: Normal rate, regular rhythm and normal heart sounds.   Pulmonary/Chest: Effort normal and breath sounds normal.  Abdominal: Soft. She exhibits no distension. There is no tenderness.  Musculoskeletal: Normal range of motion.  Neurological: She is alert and oriented to person, place, and time.  5/5 strength in major muscle groups of   bilateral upper and lower extremities. Speech normal. No facial asymetry.   Skin: Skin is warm and dry.  Psychiatric: She has a normal mood and affect. Judgment normal.    ED Course  Procedures (including critical care time)  Labs Reviewed - No data to display No results found.   1. Seizure       MDM  likely breakthrough seizure exacerbated by sleep deprivation.  Normal neurologic exam at this time.  Headache with tongue biting.  Pain treated.  Ativan in the ER.  Home with pain medicine and a prescription for Ambien to help with sleep.  Neurology followup.  No changes to her antiepileptic drug will be made.  Normal neurologic exam here.  No indication for imaging or labs.        Lyanne Co, MD 06/06/12 2022

## 2012-06-06 NOTE — ED Notes (Signed)
Per ems: pt coming from home, had seizure that lasted 5-6 mins, was witnessed by family. Pt postictal on ems arrival, became conscious/ alert+oriented on route to ED. Hx of seizures, started new medication recently. bp 138/85, pulse 90 reg, respirations 16, saO2 98% ra, cbg 168.

## 2012-06-06 NOTE — ED Notes (Signed)
[id number]<AV> Expected date:<BR> Expected time:<BR> Means of arrival:<BR> Comments:<BR> seizure

## 2012-06-09 LAB — GLUCOSE, CAPILLARY: Glucose-Capillary: 161 mg/dL — ABNORMAL HIGH (ref 70–99)

## 2012-06-17 ENCOUNTER — Ambulatory Visit: Payer: Medicaid Other | Admitting: Family

## 2012-06-20 ENCOUNTER — Encounter: Payer: Self-pay | Admitting: Family

## 2012-06-20 ENCOUNTER — Ambulatory Visit (INDEPENDENT_AMBULATORY_CARE_PROVIDER_SITE_OTHER): Payer: Medicaid Other | Admitting: Family

## 2012-06-20 VITALS — BP 120/82 | HR 80 | Temp 97.9°F | Resp 16 | Wt 198.0 lb

## 2012-06-20 DIAGNOSIS — F341 Dysthymic disorder: Secondary | ICD-10-CM

## 2012-06-20 DIAGNOSIS — R569 Unspecified convulsions: Secondary | ICD-10-CM

## 2012-06-20 DIAGNOSIS — N951 Menopausal and female climacteric states: Secondary | ICD-10-CM

## 2012-06-20 DIAGNOSIS — G43909 Migraine, unspecified, not intractable, without status migrainosus: Secondary | ICD-10-CM

## 2012-06-20 DIAGNOSIS — R232 Flushing: Secondary | ICD-10-CM

## 2012-06-20 LAB — ESTRADIOL: Estradiol: 38.9 pg/mL

## 2012-06-20 MED ORDER — CITALOPRAM HYDROBROMIDE 20 MG PO TABS: ORAL_TABLET | ORAL | Status: DC

## 2012-06-20 NOTE — Assessment & Plan Note (Signed)
Improved off of estrogen.  Will check serum estradiol to see what her natural level is.  Continue off estrogen for now.  I do not think elavil is helping and it has seizure as side effect. Stop elavil.

## 2012-06-20 NOTE — Patient Instructions (Addendum)
Please stop amitryptylline. Start citalopram. Follow up in 1 month.

## 2012-06-20 NOTE — Assessment & Plan Note (Signed)
Remains uncontrolled. Continue keppra, follow up with Dr. Smiley Houseman as scheduled.

## 2012-06-20 NOTE — Assessment & Plan Note (Signed)
Depression is unchanged.  Viibryd did not help. She is without insurance.  Cost is an issue. Will give her a try of citalopram as it is affordable.  We did discuss that her home stress is likely largest contributor to her stress and she could really benefit from counseling.  This is not a possibility at this time though due to lack of insurance.    Citalopram-  I instructed pt to start 1/2 tablet once daily for 1 week and then increase to a full tablet once daily on week two as tolerated.  We discussed common side effects such as nausea, drowsiness and weight gain.  Also discussed rare but serious side effect of suicide ideation.  She is instructed to discontinue medication go directly to ED if this occurs.  Pt verbalizes understanding.  Plan follow up in 1 month to evaluate progress.

## 2012-06-20 NOTE — Progress Notes (Signed)
Subjective:    Patient ID: [name], female    DOB: [birth year], 37 y.o.   MRN: [id number]  HPI  [name] is a 37 yr old female who presents today for follow up.  1) Depression- briefly had medicaid and saw her designated medicaid provider- Dr. Jeannetta Nap in [place].  He stopped her viibryd.  Instead he started her on elavil for depression and migraine prophylaxis. Pt notes no improvement in her depression.  Reports that she remains irritable. Has a lot of stress at home.  Tried to go to the psychiatrist at the [facility] in [place], but they would not see her as they did not accept her insurance.  2) Hx of hot flashes- she reports that she is s/h hysterectomy- reports 1 intact ovary. Had been on estrogen which was prescribed by her GYN, Dr. Emelda Fear in [place].   3) Migraine- she reports that since elavil was started and estradiol was stopped she has had no further migraines.  She reports mild sinus headache today, but "nothing like before." Denies hot flashes since stopping estradiol about 3 weeks ago.    4) Seizure disorder- reports seizure with urinary incontinence- week ago Friday. She is scheduled to see Dr. Smiley Houseman next week. She is maintained on keppra.  Review of Systems     Past Medical History  Diagnosis Date  . Seizures   . Narcolepsy   . Restless leg   . Depression   . Hyperlipidemia   . Allergic rhinitis   . Narcolepsy   . Diabetes mellitus without complication   . Migraine     History   Social History  . Marital Status: Married    Spouse Name: N/A    Number of Children: 2  . Years of Education: N/A   Occupational History  . Unemployed- full time student     Completed 12th grade   Social History Main Topics  . Smoking status: Never Smoker   . Smokeless tobacco: Never Used  . Alcohol Use: No  . Drug Use: No  . Sexually Active: Not on file   Other Topics Concern  . Not on file   Social History Narrative  . No narrative on file     Past Surgical History  Procedure Laterality Date  . Partial hysterectomy  2006  . Appendectomy  2006  . Tubal ligation  2002  . Appendectomy  2006  . Abdominal hysterectomy      Family History  Problem Relation Age of Onset  . Heart attack Mother   . Diabetes Father   . Heart attack Father     Allergies  Allergen Reactions  . Sulfa Antibiotics Other (See Comments)    Tongue swelling  . Eggs Or Egg-Derived Products Nausea And Vomiting  . Metoclopramide Hcl Other (See Comments)    Intensifies restless leg  . Penicillins Itching    Current Outpatient Prescriptions on File Prior to Visit  Medication Sig Dispense Refill  . Armodafinil (NUVIGIL) 250 MG tablet Take 125-250 mg by mouth daily. Take 1/2 to 1 tablet daily      . clonazePAM (KLONOPIN) 1 MG tablet Take 1 tablet (1 mg total) by mouth 2 (two) times daily as needed for anxiety.  60 tablet  0  . Levetiracetam (KEPPRA XR) 750 MG TB24 Take 2 tablets (1,500 mg total) by mouth 2 (two) times daily.  120 tablet  12  . metFORMIN (GLUCOPHAGE) 500 MG tablet Take 1 tablet (500 mg total) by mouth 2 (two) times  daily with a meal.  60 tablet  2  . omeprazole (PRILOSEC) 20 MG capsule TAKE 2 CAPSULES BY MOUTH EVERY DAY  60 capsule  3  . ondansetron (ZOFRAN) 4 MG tablet Take 4 mg by mouth every 6 (six) hours as needed for nausea.      Marland Kitchen rOPINIRole (REQUIP) 3 MG tablet Take 3 mg by mouth at bedtime.      Marland Kitchen zolpidem (AMBIEN CR) 6.25 MG CR tablet Take 1 tablet (6.25 mg total) by mouth at bedtime as needed for sleep.  10 tablet  0  . [DISCONTINUED] desvenlafaxine (PRISTIQ) 100 MG 24 hr tablet Take 100 mg by mouth daily.        . [DISCONTINUED] gabapentin (NEURONTIN) 300 MG capsule Take 300 mg by mouth 4 (four) times daily.         No current facility-administered medications on file prior to visit.    BP 120/82  Pulse 80  Temp(Src) 97.9 F (36.6 C) (Oral)  Resp 16  Wt 198 lb (89.812 kg)  BMI 32.95 kg/m2  SpO2 99%    Objective:    Physical Exam  Constitutional: She appears well-developed and well-nourished. No distress.  Psychiatric: Her speech is normal and behavior is normal. Judgment and thought content normal. Cognition and memory are normal.  Flat affect.          Assessment & Plan:

## 2012-06-23 ENCOUNTER — Encounter: Payer: Self-pay | Admitting: Family

## 2012-06-25 ENCOUNTER — Encounter: Payer: Self-pay | Admitting: Family

## 2012-06-25 NOTE — Telephone Encounter (Signed)
Please see letter.  Thanks!

## 2012-07-16 ENCOUNTER — Encounter: Payer: Self-pay | Admitting: Neurology

## 2012-07-16 ENCOUNTER — Ambulatory Visit: Payer: Self-pay | Admitting: Neurology

## 2012-07-16 VITALS — BP 118/74 | HR 80 | Temp 99.0°F | Ht 66.0 in | Wt 192.0 lb

## 2012-07-16 DIAGNOSIS — G43909 Migraine, unspecified, not intractable, without status migrainosus: Secondary | ICD-10-CM

## 2012-07-16 DIAGNOSIS — G40309 Generalized idiopathic epilepsy and epileptic syndromes, not intractable, without status epilepticus: Secondary | ICD-10-CM

## 2012-07-16 MED ORDER — CLONAZEPAM 1 MG PO TABS: 1.0000 mg | ORAL_TABLET | Freq: Two times a day (BID) | ORAL | Status: DC | PRN

## 2012-07-16 NOTE — Patient Instructions (Addendum)
Follow up with [place] in 4 months.  Continue Keppra as directed.

## 2012-07-16 NOTE — Progress Notes (Unsigned)
[name] returns for followup of seizure disorder. Since putting her on a long-acting form of Keppra 1500 mg twice a day, she has had one seizure in the beginning of May.  This was after not sleeping all night because she was worried about oversleeping for a graduation event.  She has been back on the Keppra and doing well since that time.  She still has depression and she no longer has the clonazepam which she has used twice a day for anxiety with benefits and the past.  Since her last visit here she has not had as severe headaches but she still has occasional headaches on the right or the left.  Review of systems is positive for occasional sleeping difficulty, anxiety and headaches in the one seizure in May.  Remainder of review of systems is unremarkable.  Past Medical History  Diagnosis Date  . Seizures   . Narcolepsy   . Restless leg   . Depression   . Hyperlipidemia   . Allergic rhinitis   . Narcolepsy   . Diabetes mellitus without complication   . Migraine     Current Outpatient Prescriptions on File Prior to Visit  Medication Sig Dispense Refill  . Armodafinil (NUVIGIL) 250 MG tablet Take 125-250 mg by mouth daily. Take 1/2 to 1 tablet daily      . citalopram (CELEXA) 20 MG tablet One tablet by mouth daily  30 tablet  0  . lansoprazole (PREVACID) 15 MG capsule Take 15 mg by mouth daily.      . Levetiracetam (KEPPRA XR) 750 MG TB24 Take 2 tablets (1,500 mg total) by mouth 2 (two) times daily.  120 tablet  12  . metFORMIN (GLUCOPHAGE) 500 MG tablet Take 1 tablet (500 mg total) by mouth 2 (two) times daily with a meal.  60 tablet  2  . rOPINIRole (REQUIP) 3 MG tablet Take 3 mg by mouth at bedtime.      . ondansetron (ZOFRAN) 4 MG tablet Take 4 mg by mouth every 6 (six) hours as needed for nausea.      Marland Kitchen zolpidem (AMBIEN CR) 6.25 MG CR tablet Take 1 tablet (6.25 mg total) by mouth at bedtime as needed for sleep.  10 tablet  0  . [DISCONTINUED] desvenlafaxine (PRISTIQ) 100 MG 24 hr  tablet Take 100 mg by mouth daily.        . [DISCONTINUED] gabapentin (NEURONTIN) 300 MG capsule Take 300 mg by mouth 4 (four) times daily.         No current facility-administered medications on file prior to visit.   Sulfa antibiotics; Eggs or egg-derived products; Metoclopramide hcl; and Penicillins  History   Social History  . Marital Status: Married    Spouse Name: N/A    Number of Children: 2  . Years of Education: N/A   Occupational History  . Unemployed- full time student     Completed 12th grade   Social History Main Topics  . Smoking status: Never Smoker   . Smokeless tobacco: Never Used  . Alcohol Use: No  . Drug Use: No  . Sexually Active: Not on file   Other Topics Concern  . Not on file   Social History Narrative  . No narrative on file    Family History  Problem Relation Age of Onset  . Heart attack Mother   . Diabetes Father   . Heart attack Father     BP 118/74  Pulse 80  Temp(Src) 99 F (37.2 C)  Ht 5\' 6"  (1.676 m)  Wt 192 lb (87.091 kg)  BMI 31 kg/m2   Alert and oriented x 3.  Memory function appears to be intact.  Concentration and attention are normal for educational level and background.  Speech is fluent and without significant word finding difficulty.  Is aware of current events.  No carotid bruits detected.  Cranial nerve II through XII are within normal limits.  This includes normal optic discs and acuity, EOMI, PERLA, facial movement and sensation intact, hearing grossly intact, gag intact,Uvula raises symmetrically and tongue protrudes evenly. Motor strength is 5 over 5 throughout all limbs.  No atrophy, abnormal tone or tremors. Reflexes are 2+ and symmetric in the upper and lower extremities Sensory exam is intact. Coordination is intact for fine movements and rapid alternating movements in all limbs Gait and station are normal.   Impression: Seizure disorder improved on Keppra XR with one breakthrough seizure after a night of  sleep deprivation. Anxiety and depression. migraines  Plan: Continue Keppra 2000 mg XR ( 1500 b.i.d.) Resume clonazepam 1.0 mg twice a day as needed for anxiety Return in 4 months

## 2012-07-17 ENCOUNTER — Other Ambulatory Visit: Payer: Self-pay | Admitting: Family

## 2012-07-21 ENCOUNTER — Ambulatory Visit: Payer: Medicaid Other | Admitting: Family

## 2012-07-24 ENCOUNTER — Ambulatory Visit: Payer: Self-pay | Admitting: Family

## 2012-07-28 ENCOUNTER — Ambulatory Visit: Payer: Self-pay | Admitting: Family

## 2012-07-30 ENCOUNTER — Other Ambulatory Visit: Payer: Self-pay | Admitting: Family

## 2012-08-06 ENCOUNTER — Ambulatory Visit (INDEPENDENT_AMBULATORY_CARE_PROVIDER_SITE_OTHER): Payer: Medicaid Other | Admitting: Family

## 2012-08-06 ENCOUNTER — Encounter: Payer: Self-pay | Admitting: Family

## 2012-08-06 VITALS — BP 120/78 | HR 66 | Temp 98.2°F | Resp 16 | Wt 196.1 lb

## 2012-08-06 DIAGNOSIS — F329 Major depressive disorder, single episode, unspecified: Secondary | ICD-10-CM

## 2012-08-06 DIAGNOSIS — F3289 Other specified depressive episodes: Secondary | ICD-10-CM

## 2012-08-06 DIAGNOSIS — R569 Unspecified convulsions: Secondary | ICD-10-CM

## 2012-08-06 MED ORDER — CITALOPRAM HYDROBROMIDE 10 MG PO TABS: 10.0000 mg | ORAL_TABLET | Freq: Every day | ORAL | Status: DC

## 2012-08-06 NOTE — Patient Instructions (Addendum)
Please follow up in 3-4 weeks

## 2012-08-06 NOTE — Assessment & Plan Note (Signed)
Depression is deteriorated.  She is without insurance. Will give trial of brintellix- samples provided. If this helps, we can try to request pt assistance.  She is instructed to go to the ED if thoughts of SI/HI occur.

## 2012-08-06 NOTE — Progress Notes (Signed)
  Subjective:    Patient ID: [name], female    DOB: Jun 05, 1975, 37 y.o.   MRN: [id number]  HPI  Depression- [name] is a 37 yr old female who presents today for follow up of her depression.  Last visit she was started on citalopram.  She is out of insurance and is waiting to hear back if [facility] can help her with her medical costs.  She reports no improving since starting citalopram.  Reports that she still feels down and "sleeps all the time." Tearfulness.  Denies SI/HI.  She is currently not working.  Her disability has been denied. Can't find a lawyer "who will take it."   Epilepsy- no seizures since her last visit with [name]. Continues Keppra which she receives through pt assistance.    Review of Systems See HPI      Objective:   Physical Exam  Constitutional: She appears well-developed and well-nourished. No distress.  Psychiatric: Her speech is normal and behavior is normal. Judgment and thought content normal. Her affect is labile. Cognition and memory are normal. She does not exhibit a depressed mood.          Assessment & Plan:  s

## 2012-08-06 NOTE — Assessment & Plan Note (Signed)
Currently stable on keppra. Management per neurology.

## 2012-08-12 ENCOUNTER — Encounter: Payer: Self-pay | Admitting: Family

## 2012-08-13 ENCOUNTER — Telehealth: Payer: Self-pay | Admitting: Family

## 2012-08-13 NOTE — Telephone Encounter (Signed)
Please advise to stop med. Restart celexa to avoid withdrawal & take until she can be re-evaluated. She may take oral benadryl, loratidine, and pepcid for itching if it is not resolving. Or if pt is feeling suicidal or homicidal or not able to complete tasks, she should go to ER for immediate eval.

## 2012-08-13 NOTE — Telephone Encounter (Signed)
Spoke with pt. She stopped medication last night. Still has itching and has been taking benadryl every 4 hours. Reports stumbling last night and early this morning and wonders if this is due to stopping Brintellix as she reports that she has not had this reaction to benadryl in the past. Denies blurred vision or difficulty seeing.  Pt states she is currently without insurance and is self pay.  Please advise re: medication alternative and itching.

## 2012-08-13 NOTE — Telephone Encounter (Signed)
Notified pt. She voices understanding and will restart Celexa. Will try claritin 10mg  and pepcid 20mg .  Pt scheduled appt with [name] on 08/18/12 at 4pm and will proceed to ER for further evaluation if Celexa is not sufficient.

## 2012-08-13 NOTE — Telephone Encounter (Signed)
Caller: [name]/Patient; Phone: [phone]; Reason for Call: Pt was told by triage to hold antidepression medication until spoke with MD today.  Pt spoke with Nicki Guadalajara in office who stated she would call pt back.  Pt has not heard anything further about what to do and is continuing to itch.  Please advise pt with further recommendation.

## 2012-08-13 NOTE — Telephone Encounter (Signed)
Spoke with pt on phone. She states that she is unable to find a lawyer that will take her disability case. Also advised her to call around to different psychiatry groups to check on each office policy re: self pay billing as we do not know what each office's policy is. Pt voices understanding.  See phone note re: other concerns above.

## 2012-08-13 NOTE — Telephone Encounter (Signed)
Patient states that she has been "itching all over" since [name] changed her anti-depression pills at last visit. She states that she has tried using lotion and Epsom Salts to help relieve the itching but nothing is helping.

## 2012-08-14 ENCOUNTER — Ambulatory Visit (INDEPENDENT_AMBULATORY_CARE_PROVIDER_SITE_OTHER): Payer: Medicaid Other | Admitting: Nurse Practitioner

## 2012-08-14 ENCOUNTER — Emergency Department (HOSPITAL_COMMUNITY): Payer: Self-pay

## 2012-08-14 ENCOUNTER — Ambulatory Visit: Payer: Medicaid Other | Admitting: Nurse Practitioner

## 2012-08-14 ENCOUNTER — Encounter: Payer: Self-pay | Admitting: Nurse Practitioner

## 2012-08-14 ENCOUNTER — Emergency Department (HOSPITAL_COMMUNITY)
Admission: EM | Admit: 2012-08-14 | Discharge: 2012-08-15 | Disposition: A | Discharge status: Other Acute Inpatient Hospital | Payer: Self-pay | Attending: Emergency Medicine | Admitting: Emergency Medicine

## 2012-08-14 ENCOUNTER — Encounter (HOSPITAL_COMMUNITY): Payer: Self-pay | Admitting: Emergency Medicine

## 2012-08-14 VITALS — BP 110/80 | HR 82 | Temp 98.5°F | Resp 16 | Wt 193.1 lb

## 2012-08-14 DIAGNOSIS — F39 Unspecified mood [affective] disorder: Secondary | ICD-10-CM | POA: Insufficient documentation

## 2012-08-14 DIAGNOSIS — Z1331 Encounter for screening for depression: Secondary | ICD-10-CM

## 2012-08-14 DIAGNOSIS — Z8679 Personal history of other diseases of the circulatory system: Secondary | ICD-10-CM | POA: Insufficient documentation

## 2012-08-14 DIAGNOSIS — F329 Major depressive disorder, single episode, unspecified: Secondary | ICD-10-CM

## 2012-08-14 DIAGNOSIS — G2581 Restless legs syndrome: Secondary | ICD-10-CM | POA: Insufficient documentation

## 2012-08-14 DIAGNOSIS — Z8639 Personal history of other endocrine, nutritional and metabolic disease: Secondary | ICD-10-CM | POA: Insufficient documentation

## 2012-08-14 DIAGNOSIS — Z79899 Other long term (current) drug therapy: Secondary | ICD-10-CM | POA: Insufficient documentation

## 2012-08-14 DIAGNOSIS — Z8669 Personal history of other diseases of the nervous system and sense organs: Secondary | ICD-10-CM | POA: Insufficient documentation

## 2012-08-14 DIAGNOSIS — R45851 Suicidal ideations: Secondary | ICD-10-CM

## 2012-08-14 DIAGNOSIS — Z880 Allergy status to penicillin: Secondary | ICD-10-CM | POA: Insufficient documentation

## 2012-08-14 DIAGNOSIS — Z3202 Encounter for pregnancy test, result negative: Secondary | ICD-10-CM | POA: Insufficient documentation

## 2012-08-14 DIAGNOSIS — Z862 Personal history of diseases of the blood and blood-forming organs and certain disorders involving the immune mechanism: Secondary | ICD-10-CM | POA: Insufficient documentation

## 2012-08-14 DIAGNOSIS — E119 Type 2 diabetes mellitus without complications: Secondary | ICD-10-CM | POA: Insufficient documentation

## 2012-08-14 DIAGNOSIS — G40909 Epilepsy, unspecified, not intractable, without status epilepticus: Secondary | ICD-10-CM | POA: Insufficient documentation

## 2012-08-14 DIAGNOSIS — R21 Rash and other nonspecific skin eruption: Secondary | ICD-10-CM | POA: Insufficient documentation

## 2012-08-14 LAB — RAPID URINE DRUG SCREEN, HOSP PERFORMED
Amphetamines: NOT DETECTED
Benzodiazepines: POSITIVE — AB
Cocaine: NOT DETECTED
Opiates: NOT DETECTED
Tetrahydrocannabinol: NOT DETECTED

## 2012-08-14 LAB — COMPREHENSIVE METABOLIC PANEL
Albumin: 3.6 g/dL (ref 3.5–5.2)
Alkaline Phosphatase: 76 U/L (ref 39–117)
BUN: 12 mg/dL (ref 6–23)
Calcium: 9.5 mg/dL (ref 8.4–10.5)
GFR calc Af Amer: >90 mL/min (ref >90)
Glucose, Bld: 159 mg/dL — ABNORMAL HIGH (ref 70–99)
Potassium: 4 mEq/L (ref 3.5–5.1)
Total Protein: 7 g/dL (ref 6.0–8.3)

## 2012-08-14 LAB — URINALYSIS, ROUTINE W REFLEX MICROSCOPIC
Bilirubin Urine: NEGATIVE
Hgb urine dipstick: NEGATIVE
Ketones, ur: NEGATIVE mg/dL
Protein, ur: NEGATIVE mg/dL
Specific Gravity, Urine: 1.019 (ref 1.005–1.030)
Urobilinogen, UA: 0.2 mg/dL (ref 0.0–1.0)

## 2012-08-14 LAB — CBC
HCT: 34.7 % — ABNORMAL LOW (ref 36.0–46.0)
Hemoglobin: 11.3 g/dL — ABNORMAL LOW (ref 12.0–15.0)
MCH: 25.6 pg — ABNORMAL LOW (ref 26.0–34.0)
MCHC: 32.6 g/dL (ref 30.0–36.0)
RDW: 13.7 % (ref 11.5–15.5)

## 2012-08-14 MED ORDER — ALUM & MAG HYDROXIDE-SIMETH 200-200-20 MG/5ML PO SUSP: 30.0000 mL | ORAL | Status: DC | PRN

## 2012-08-14 MED ORDER — ARMODAFINIL 250 MG PO TABS: 125.0000 mg | ORAL_TABLET | Freq: Every day | ORAL | Status: DC

## 2012-08-14 MED ORDER — LORAZEPAM 1 MG PO TABS: 1.0000 mg | ORAL_TABLET | Freq: Three times a day (TID) | ORAL | Status: DC | PRN

## 2012-08-14 MED ORDER — METFORMIN HCL 500 MG PO TABS
500.0000 mg | ORAL_TABLET | Freq: Two times a day (BID) | ORAL | Status: DC
  Administered 2012-08-14: 500 mg via ORAL
  Filled 2012-08-14 (×2): qty 1

## 2012-08-14 MED ORDER — LORATADINE 10 MG PO TABS
10.0000 mg | ORAL_TABLET | Freq: Every day | ORAL | Status: DC
  Administered 2012-08-14: 10 mg via ORAL
  Filled 2012-08-14: qty 1

## 2012-08-14 MED ORDER — NICOTINE 21 MG/24HR TD PT24: 21.0000 mg | MEDICATED_PATCH | Freq: Every day | TRANSDERMAL | Status: DC

## 2012-08-14 MED ORDER — IBUPROFEN 200 MG PO TABS: 600.0000 mg | ORAL_TABLET | Freq: Three times a day (TID) | ORAL | Status: DC | PRN

## 2012-08-14 MED ORDER — ZOLPIDEM TARTRATE 5 MG PO TABS: 5.0000 mg | ORAL_TABLET | Freq: Every evening | ORAL | Status: DC | PRN

## 2012-08-14 MED ORDER — LEVETIRACETAM ER 500 MG PO TB24
1500.0000 mg | ORAL_TABLET | Freq: Two times a day (BID) | ORAL | Status: DC
  Administered 2012-08-14: 1500 mg via ORAL
  Filled 2012-08-14: qty 3

## 2012-08-14 MED ORDER — LEVETIRACETAM ER 750 MG PO TB24: 1500.0000 mg | ORAL_TABLET | Freq: Two times a day (BID) | ORAL | Status: DC

## 2012-08-14 MED ORDER — PANTOPRAZOLE SODIUM 20 MG PO TBEC
20.0000 mg | DELAYED_RELEASE_TABLET | Freq: Every day | ORAL | Status: DC
  Administered 2012-08-14: 20 mg via ORAL
  Filled 2012-08-14: qty 1

## 2012-08-14 MED ORDER — ONDANSETRON HCL 4 MG PO TABS: 4.0000 mg | ORAL_TABLET | Freq: Three times a day (TID) | ORAL | Status: DC | PRN

## 2012-08-14 NOTE — ED Notes (Signed)
Pt. States that she has been trying to find work, got a job last May and had to  Leave r/t anxiety.  Has been trying to get disability, had her last hearing in 12/13 and was denied again.  Feels overwhelmed  With her own issues and family stressors and wants help.

## 2012-08-14 NOTE — ED Notes (Signed)
[facility] without a plan x 1 week

## 2012-08-14 NOTE — Progress Notes (Signed)
Subjective:     [name] is a 37 y.o. female who presents for follow up of depression. Approximately one week ago she was seen in the office for worsening depression. Her medication was changed from Celexa to Brintellix. She developed itching after 5 days of the medicine and now has a rash across her back. She stopped the medicine 2 days ago and has not taken any other depression meds.Current symptoms include depressed mood, difficulty concentrating, hopelessness, recurrent thoughts of death and suicidal thoughts without plan. She is unable to complete tasks such as cooking and buying groceries for her family. Her 36 y.o son has been doing these things for her. When her family was gone from home, 1 week ago she had suicidal thoughts: "now would be a good time" She did not have a specific plan. She states she cries all day.Her depression has been long-standing, but thinks it has worsened from family events. Patient denies suicidal attempt, weight gain and weight loss. Previous treatment includes: medication. She complains of the following side effects from the treatment: dry mouth and pruritus. Of note, she does not have financial means to see psychiatry. Also she has been referred to rheumatology in past due to elevated ANA titers with speckled pattern, but has not had financial means to see anyone. She is being treated for seizure disorder.  The following portions of the patient's history were reviewed and updated as appropriate: allergies, current medications, past family history, past medical history, past social history, past surgical history and problem list.  Review of Systems Constitutional: positive for fatigue and crying all the time, negative for anorexia, chills, fevers and night sweats Respiratory: negative for asthma, cough and wheezing Cardiovascular: negative for chest pain and had some palpitations yesterday lasting few seconds. Gastrointestinal: negative for abdominal pain, change in  bowel habits, constipation and diarrhea Musculoskeletal:has occasional migratory joint pain, c/o R ulnar neuropathy Neurological: positive for headaches and memory problems, negative for coordination problems and weakness Behavioral/Psych: positive for depression and sleep disturbance, negative for abusive relationship, excessive alcohol consumption and illegal drug usage    Objective:    BP 110/80  Pulse 82  Temp(Src) 98.5 F (36.9 C) (Oral)  Resp 16  Wt 193 lb 1.3 oz (87.581 kg)  BMI 31.18 kg/m2  SpO2 98%  General:  alert, cooperative, appears stated age, fatigued, mild distress and tearful  Affect & Behavior:  normal perception, normal reasoning, normal speech pattern and content and normal thought patterns flattened affect      Assessment:    Depression, worsened, with recent suicidal ideation. Had to stop brintellix due to pruritus. Hx of elevated ANA titers as high as 1200 w/speckled pattern, no rheum follow up Hx slightly low calcium 8      Plan:  Pt agreeable to go to [facility] ED for psych eval. Will be transported by EMS services or [place] police Dept Pt observed until transport. Offered beverages while waiting transport. Pt notified husband and son (who was in waiting area).

## 2012-08-14 NOTE — ED Provider Notes (Signed)
History    CSN: [id number] Arrival date & time 08/14/12  1304  First MD Initiated Contact with Patient 08/14/12 1336     Chief Complaint  Patient presents with  . Suicidal   (Consider location/radiation/quality/duration/timing/severity/associated sxs/prior Treatment) HPI Comments: 37 year old female the past medical history of depression, seizures, narcolepsy, restless leg syndrome, diabetes and hyperlipidemia presents to the emergency department from her primary care's office with suicidal ideations. Patient states over the past week she has had increasing suicidal thoughts without any plan. Admits she's had these thoughts "for a while now", however did not express them to anyone. She went to her PCP's office today with concern of a slight rash to her torso since being switched from Celexa to Brintellix on 7/2. She stopped taking the Brintellix that day and rash has started to ease off, itchy. States she is under a large amount of stress with life right now. Denies HI. Does admit to dysuria beginning this morning. No abdominal pain.  The history is provided by the patient.   Past Medical History  Diagnosis Date  . Seizures   . Narcolepsy   . Restless leg   . Depression   . Hyperlipidemia   . Allergic rhinitis   . Narcolepsy   . Diabetes mellitus without complication   . Migraine    Past Surgical History  Procedure Laterality Date  . Partial hysterectomy  2006  . Appendectomy  2006  . Tubal ligation  2002  . Appendectomy  2006  . Abdominal hysterectomy     Family History  Problem Relation Age of Onset  . Heart attack Mother   . Diabetes Father   . Heart attack Father    History  Substance Use Topics  . Smoking status: Never Smoker   . Smokeless tobacco: Never Used  . Alcohol Use: No   OB History   Grav Para Term Preterm Abortions TAB SAB Ect Mult Living                 Review of Systems  Skin: Positive for rash.  Psychiatric/Behavioral: Positive for suicidal  ideas and dysphoric mood. Negative for self-injury.  All other systems reviewed and are negative.    Allergies  Sulfa antibiotics; Eggs or egg-derived products; Metoclopramide hcl; and Penicillins  Home Medications   Current Outpatient Rx  Name  Route  Sig  Dispense  Refill  . Armodafinil (NUVIGIL) 250 MG tablet   Oral   Take 125-250 mg by mouth daily. Take 1/2 to 1 tablet daily         . clonazePAM (KLONOPIN) 1 MG tablet   Oral   Take 1 tablet (1 mg total) by mouth 2 (two) times daily as needed for anxiety.   60 tablet   3   . diphenhydrAMINE (BENADRYL) 25 MG tablet   Oral   Take 50 mg by mouth every 4 (four) hours as needed for itching.         . lansoprazole (PREVACID) 15 MG capsule   Oral   Take 15 mg by mouth daily.         . Levetiracetam (KEPPRA XR) 750 MG TB24   Oral   Take 2 tablets (1,500 mg total) by mouth 2 (two) times daily.   120 tablet   12   . loratadine (CLARITIN) 10 MG tablet   Oral   Take 10 mg by mouth daily.         . metFORMIN (GLUCOPHAGE) 500 MG tablet  Oral   Take 1 tablet (500 mg total) by mouth 2 (two) times daily with a meal.   60 tablet   2   . ondansetron (ZOFRAN) 4 MG tablet   Oral   Take 4 mg by mouth every 6 (six) hours as needed for nausea.         Marland Kitchen rOPINIRole (REQUIP) 3 MG tablet      TAKE ONE TABLET BY MOUTH AT BEDTIME   30 tablet   1   . zolpidem (AMBIEN CR) 6.25 MG CR tablet   Oral   Take 1 tablet (6.25 mg total) by mouth at bedtime as needed for sleep.   10 tablet   0    BP 112/71  Pulse 73  Temp(Src) 98.7 F (37.1 C) (Oral)  Resp 23  SpO2 98% Physical Exam  Constitutional: She is oriented to person, place, and time. She appears well-developed and well-nourished. No distress.  HENT:  Head: Normocephalic and atraumatic.  Mouth/Throat: Oropharynx is clear and moist.  Eyes: Conjunctivae are normal.  Neck: Normal range of motion. Neck supple.  Cardiovascular: Normal rate, regular rhythm and  normal heart sounds.   Pulmonary/Chest: Effort normal and breath sounds normal.  Abdominal: Soft. Bowel sounds are normal. There is no tenderness.  Musculoskeletal: Normal range of motion. She exhibits no edema.  Neurological: She is alert and oriented to person, place, and time.  Skin: Skin is warm and dry. She is not diaphoretic.  Slight scattered maculopapular rash to lateral aspect of trunk bilateral. No erythema.  Psychiatric: Her behavior is normal. She exhibits a depressed mood. She expresses suicidal ideation. She expresses no homicidal ideation. She expresses no suicidal plans.    ED Course  Procedures (including critical care time) Labs Reviewed  CBC - Abnormal; Notable for the following:    Hemoglobin 11.3 (*)    HCT 34.7 (*)    MCH 25.6 (*)    All other components within normal limits  COMPREHENSIVE METABOLIC PANEL - Abnormal; Notable for the following:    Glucose, Bld 159 (*)    AST 76 (*)    ALT 93 (*)    GFR calc non Af Amer 80 (*)    All other components within normal limits  URINE RAPID DRUG SCREEN (HOSP PERFORMED) - Abnormal; Notable for the following:    Benzodiazepines POSITIVE (*)    All other components within normal limits  SALICYLATE LEVEL - Abnormal; Notable for the following:    Salicylate Lvl <2.0 (*)    All other components within normal limits  ACETAMINOPHEN LEVEL  ETHANOL  URINALYSIS, ROUTINE W REFLEX MICROSCOPIC  POCT PREGNANCY, URINE   No results found. No diagnosis found.  MDM  Patient with SI, no plan. Psych labs pending. Obtaining UA due to dysuria. She is in NAD.  3:57 PM LFTs mildly elevated, no hx of same. Will get abdominal US. Patient discussed with Dr. Blinda Leatherwood who agrees with plan of care .5:05 PM Abdominal US without any acute abnormality. UA negative. Patient medically cleared to move to psych ED.  Trevor Mace, PA-C 08/14/12 1706

## 2012-08-14 NOTE — Consult Note (Signed)
Reason for Consult:Eval for Ip psychiatric mgmt Referring Physician: [facility] EDP  [name] is an 37 y.o. female.  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 Patient also endorses passive SI but denies HI or AVH at this present time.Patient states that she cannot contract for safety and is seeking voluntarily IP mgmt of her long standing depressive sx. Patient isn't on any psychotropics at present and denies illicit drug use, alcohol consumption or use of tobacco products.  Past Medical History  Diagnosis Date  . Seizures   . Narcolepsy   . Restless leg   . Depression   . Hyperlipidemia   . Allergic rhinitis   . Narcolepsy   . Diabetes mellitus without complication   . Migraine     Past Surgical History  Procedure Laterality Date  . Partial hysterectomy  2006  . Appendectomy  2006  . Tubal ligation  2002  . Appendectomy  2006  . Abdominal hysterectomy      Family History  Problem Relation Age of Onset  . Heart attack Mother   . Diabetes Father   . Heart attack Father     Social History:  reports that she has never smoked. She has never used smokeless tobacco. She reports that she does not drink alcohol or use illicit drugs.  Allergies:  Allergies  Allergen Reactions  . Sulfa Antibiotics Other (See Comments)    Tongue swelling  . Eggs Or Egg-Derived Products Nausea And Vomiting  . Metoclopramide Hcl Other (See Comments)    Intensifies restless leg  . Penicillins Itching    Medications: I have reviewed the patient's current medications.  Results for orders placed during the hospital encounter of 08/14/12 (from the past 48 hour(s))  CBC     Status: Abnormal   Collection Time    08/14/12  2:05 PM      Result Value Range   WBC 6.8  4.0 -  10.5 K/uL   RBC 4.41  3.87 - 5.11 MIL/uL   Hemoglobin 11.3 (*) 12.0 - 15.0 g/dL   HCT 16.1 (*) 09.6 - 04.5 %   MCV 78.7  78.0 - 100.0 fL   MCH 25.6 (*) 26.0 - 34.0 pg   MCHC 32.6  30.0 - 36.0 g/dL   RDW 40.9  81.1 - 91.4 %   Platelets 211  150 - 400 K/uL  COMPREHENSIVE METABOLIC PANEL     Status: Abnormal   Collection Time    08/14/12  2:05 PM      Result Value Range   Sodium 137  135 - 145 mEq/L   Potassium 4.0  3.5 - 5.1 mEq/L   Chloride 103  96 - 112 mEq/L   CO2 26  19 - 32 mEq/L   Glucose, Bld 159 (*) 70 - 99 mg/dL   BUN 12  6 - 23 mg/dL   Creatinine, Ser 7.82  0.50 - 1.10 mg/dL   Calcium 9.5  8.4 - 95.6 mg/dL   Total Protein 7.0  6.0 - 8.3 g/dL   Albumin 3.6  3.5 - 5.2 g/dL   AST 76 (*) 0 - 37 U/L   ALT 93 (*) 0 - 35 U/L   Alkaline Phosphatase 76  39 - 117 U/L   Total Bilirubin 0.5  0.3 - 1.2  mg/dL   GFR calc non Af Amer 80 (*) >90 mL/min   GFR calc Af Amer >90  >90 mL/min   Comment:            The eGFR has been calculated     using the CKD EPI equation.     This calculation has not been     validated in all clinical     situations.     eGFR's persistently     <90 mL/min signify     possible Chronic Kidney Disease.  ACETAMINOPHEN LEVEL     Status: None   Collection Time    08/14/12  2:05 PM      Result Value Range   Acetaminophen (Tylenol), Serum <15.0  10 - 30 ug/mL   Comment:            THERAPEUTIC CONCENTRATIONS VARY     SIGNIFICANTLY. A RANGE OF 10-30     ug/mL MAY BE AN EFFECTIVE     CONCENTRATION FOR MANY PATIENTS.     HOWEVER, SOME ARE BEST TREATED     AT CONCENTRATIONS OUTSIDE THIS     RANGE.     ACETAMINOPHEN CONCENTRATIONS     >150 ug/mL AT 4 HOURS AFTER     INGESTION AND >50 ug/mL AT 12     HOURS AFTER INGESTION ARE     OFTEN ASSOCIATED WITH TOXIC     REACTIONS.  SALICYLATE LEVEL     Status: Abnormal   Collection Time    08/14/12  2:05 PM      Result Value Range   Salicylate Lvl <2.0 (*) 2.8 - 20.0 mg/dL  ETHANOL     Status: None    Collection Time    08/14/12  2:05 PM      Result Value Range   Alcohol, Ethyl (B) <11  0 - 11 mg/dL   Comment:            LOWEST DETECTABLE LIMIT FOR     SERUM ALCOHOL IS 11 mg/dL     FOR MEDICAL PURPOSES ONLY  URINE RAPID DRUG SCREEN (HOSP PERFORMED)     Status: Abnormal   Collection Time    08/14/12  2:13 PM      Result Value Range   Opiates NONE DETECTED  NONE DETECTED   Cocaine NONE DETECTED  NONE DETECTED   Benzodiazepines POSITIVE (*) NONE DETECTED   Amphetamines NONE DETECTED  NONE DETECTED   Tetrahydrocannabinol NONE DETECTED  NONE DETECTED   Barbiturates NONE DETECTED  NONE DETECTED   Comment:            DRUG SCREEN FOR MEDICAL PURPOSES     ONLY.  IF CONFIRMATION IS NEEDED     FOR ANY PURPOSE, NOTIFY LAB     WITHIN 5 DAYS.                LOWEST DETECTABLE LIMITS     FOR URINE DRUG SCREEN     Drug Class       Cutoff (ng/mL)     Amphetamine      1000     Barbiturate      200     Benzodiazepine   200     Tricyclics       300     Opiates          300     Cocaine          300     THC  50  URINALYSIS, ROUTINE W REFLEX MICROSCOPIC     Status: None   Collection Time    08/14/12  2:13 PM      Result Value Range   Color, Urine YELLOW  YELLOW   APPearance CLEAR  CLEAR   Specific Gravity, Urine 1.019  1.005 - 1.030   pH 5.0  5.0 - 8.0   Glucose, UA NEGATIVE  NEGATIVE mg/dL   Hgb urine dipstick NEGATIVE  NEGATIVE   Bilirubin Urine NEGATIVE  NEGATIVE   Ketones, ur NEGATIVE  NEGATIVE mg/dL   Protein, ur NEGATIVE  NEGATIVE mg/dL   Urobilinogen, UA 0.2  0.0 - 1.0 mg/dL   Nitrite NEGATIVE  NEGATIVE   Leukocytes, UA NEGATIVE  NEGATIVE   Comment: MICROSCOPIC NOT DONE ON URINES WITH NEGATIVE PROTEIN, BLOOD, LEUKOCYTES, NITRITE, OR GLUCOSE <1000 mg/dL.  POCT PREGNANCY, URINE     Status: None   Collection Time    08/14/12  2:19 PM      Result Value Range   Preg Test, Ur NEGATIVE  NEGATIVE   Comment:            THE SENSITIVITY OF THIS     METHODOLOGY IS >24  mIU/mL    US Abdomen Complete  08/14/2012   *RADIOLOGY REPORT*  Clinical Data:  37 year old female with elevated LFTs.  ABDOMINAL ULTRASOUND COMPLETE  Comparison:  03/24/2009 ultrasound  Findings:  Gallbladder:  The gallbladder is unremarkable. There is no evidence of gallstones, gallbladder wall thickening, or pericholecystic fluid.  Common Bile Duct:  There is no evidence of intrahepatic or extrahepatic biliary dilation. The CBD measures 3.8 mm in greatest diameter.  Liver: Mild increased echogenicity of the liver is noted compatible with mild fatty infiltration.  No focal abnormalities are identified.  IVC:  Appears normal.  Pancreas: The pancreas is not visualized secondary to overlying bowel gas.  Spleen: Splenomegaly is identified with splenic volume of 589 ml. No focal splenic lesions are noted.  Right kidney:  The right kidney is normal in size and parenchymal echogenicity.  There is no evidence of solid mass, hydronephrosis or definite renal calculi.  The right kidney measures 11.2 cm.  Left kidney:  The left kidney is normal in size and parenchymal echogenicity.  There is no evidence of solid mass, hydronephrosis or definite renal calculi.   The left kidney measures 12.1 cm.  Abdominal Aorta:  No abdominal aortic aneurysm identified.  The mid and distal abdominal aorta is not well visualized secondary to overlying bowel gas.  There is no evidence of ascites.  IMPRESSION: No evidence of acute abnormality.  Mild fatty infiltration of the liver and mild splenomegaly.   Original Report Authenticated By: Harmon Pier, M.D.    Review of Systems  Genitourinary: Positive for flank pain.  Musculoskeletal: Positive for myalgias.  Neurological: Positive for seizures. Negative for weakness.  Psychiatric/Behavioral: Positive for depression, suicidal ideas and memory loss. Negative for hallucinations and substance abuse. The patient is nervous/anxious and has insomnia.        Patient endorses passive SI with  inability to contract for safety at this time. Patient denies HI or AVH  All other systems reviewed and are negative.   Blood pressure 130/83, pulse 69, temperature 99 F (37.2 C), temperature source Oral, resp. rate 18, SpO2 98.00%. Physical Exam  Nursing note and vitals reviewed. Constitutional: She is oriented to person, place, and time. She appears well-developed and well-nourished.  HENT:  Head: Normocephalic.  Eyes: Pupils are equal, round, and reactive  to light.  Neck: Neck supple. No thyromegaly present.  Cardiovascular: Normal rate and regular rhythm.   Respiratory: Breath sounds normal.  Neurological: She is alert and oriented to person, place, and time.  Skin: Skin is warm and dry.  Psychiatric:  Sad with flat affect, good eye contact with good insight and thought process, speech normal tone and non pressured    Assessment/Plan: 1) Admit to [facility] hall at [facility] for crises mgt, safety and stabilization of mood d/o with SI 2) Social work to aid in OP support services and psychiatric care 3) Mgmt of applicable co-morbid conditions 4) Intensive IP psychotherapy and administration of psychotropic Rx  SIMON,SPENCER E 08/14/2012, 9:36 PM

## 2012-08-14 NOTE — BH Assessment (Signed)
Assessment Note   [name] is an 37 y.o. female.   Pt depressed and was at PCP today and MD was concerned about pt and recommended pt come to ED.  Pt appears to be having some side effects to a newer medication, pt reports being depressed and having suicidal thoughts.   See PA note  Pt denies HI and AVH and SA.  Pt has not had inptx in the past.  Pt is cooperative and appears invested in tx.    Pt has a flat affect, Ox3, judgment impaired, speech soft but clear, ADLs appropriate, pt was cooperative, anxious   Axis I: Major Depression, Recurrent severe Axis II: Deferred Axis III:  Past Medical History  Diagnosis Date  . Seizures   . Narcolepsy   . Restless leg   . Depression   . Hyperlipidemia   . Allergic rhinitis   . Narcolepsy   . Diabetes mellitus without complication   . Migraine    Axis IV: other psychosocial or environmental problems, problems related to social environment and problems with primary support group Axis V: 41-50 serious symptoms  Past Medical History:  Past Medical History  Diagnosis Date  . Seizures   . Narcolepsy   . Restless leg   . Depression   . Hyperlipidemia   . Allergic rhinitis   . Narcolepsy   . Diabetes mellitus without complication   . Migraine     Past Surgical History  Procedure Laterality Date  . Partial hysterectomy  2006  . Appendectomy  2006  . Tubal ligation  2002  . Appendectomy  2006  . Abdominal hysterectomy      Family History:  Family History  Problem Relation Age of Onset  . Heart attack Mother   . Diabetes Father   . Heart attack Father     Social History:  reports that she has never smoked. She has never used smokeless tobacco. She reports that she does not drink alcohol or use illicit drugs.  Additional Social History:  Alcohol / Drug Use Pain Medications: na Prescriptions: na Over the Counter: na History of alcohol / drug use?: No history of alcohol / drug abuse  CIWA: CIWA-Ar BP: 130/83  mmHg Pulse Rate: 69 COWS:    Allergies:  Allergies  Allergen Reactions  . Sulfa Antibiotics Other (See Comments)    Tongue swelling  . Eggs Or Egg-Derived Products Nausea And Vomiting  . Metoclopramide Hcl Other (See Comments)    Intensifies restless leg  . Penicillins Itching    Home Medications:  (Not in a hospital admission)  OB/GYN Status:  No LMP recorded. Patient has had a hysterectomy.  General Assessment Data Location of Assessment: [facility] ED Can pt return to current living arrangement?: Yes Admission Status: Voluntary Is patient capable of signing voluntary admission?: Yes Transfer from: Acute Hospital Referral Source: MD  Education Status Is patient currently in school?: No  Risk to self Suicidal Ideation: Yes-Currently Present Suicidal Intent: No-Not Currently/Within Last 6 Months Is patient at risk for suicide?: Yes Suicidal Plan?: No-Not Currently/Within Last 6 Months Access to Means: Yes Specify Access to Suicidal Means: access to pills What has been your use of drugs/alcohol within the last 12 months?: none Previous Attempts/Gestures: No How many times?: 0 Other Self Harm Risks: na Triggers for Past Attempts: None known Intentional Self Injurious Behavior: None Family Suicide History: No Recent stressful life event(s): Other (Comment) (can't contract for safety) Persecutory voices/beliefs?: No Depression: Yes Depression Symptoms: Tearfulness;Isolating;Fatigue;Guilt;Loss of interest in  usual pleasures;Feeling worthless/self pity;Feeling angry/irritable Substance abuse history and/or treatment for substance abuse?: No Suicide prevention information given to non-admitted patients: Not applicable  Risk to Others Homicidal Ideation: No Thoughts of Harm to Others: No Current Homicidal Intent: No Current Homicidal Plan: No Access to Homicidal Means: No Identified Victim: na History of harm to others?: No Assessment of Violence: None Noted Violent  Behavior Description: cooperative Does patient have access to weapons?: No Criminal Charges Pending?: No Does patient have a court date: No  Psychosis Hallucinations: None noted Delusions: None noted  Mental Status Report Appear/Hygiene: Disheveled Eye Contact: Fair Motor Activity: Unremarkable Speech: Logical/coherent;Soft Level of Consciousness: Alert Mood: Depressed;Anxious;Sad;Worthless, low self-esteem Affect: Anxious;Depressed;Sad Anxiety Level: Moderate Thought Processes: Coherent Judgement: Impaired Orientation: Person;Place;Situation Obsessive Compulsive Thoughts/Behaviors: None  Cognitive Functioning Concentration: Decreased Memory: Recent Intact;Remote Intact IQ: Average Insight: Poor Impulse Control: Poor Appetite: Fair Weight Loss: 0 Weight Gain: 0 Sleep: Decreased Total Hours of Sleep: 3 Vegetative Symptoms: None  ADLScreening [facility] Assessment Services) Patient's cognitive ability adequate to safely complete daily activities?: Yes Patient able to express need for assistance with ADLs?: Yes Independently performs ADLs?: Yes (appropriate for developmental age)  Abuse/Neglect [facility]) Physical Abuse: Denies Verbal Abuse: Denies Sexual Abuse: Denies  Prior Inpatient Therapy Prior Inpatient Therapy: No Prior Therapy Dates: na Prior Therapy Facilty/Provider(s): na Reason for Treatment: na  Prior Outpatient Therapy Prior Outpatient Therapy: Yes Prior Therapy Dates: 2013 Prior Therapy Facilty/Provider(s): pcp Reason for Treatment: med mgt  ADL Screening (condition at time of admission) Patient's cognitive ability adequate to safely complete daily activities?: Yes Is the patient deaf or have difficulty hearing?: Yes Does the patient have difficulty seeing, even when wearing glasses/contacts?: Yes Does the patient have difficulty concentrating, remembering, or making decisions?: Yes Patient able to express need for assistance with ADLs?: Yes Does the  patient have difficulty dressing or bathing?: Yes Independently performs ADLs?: Yes (appropriate for developmental age) Does the patient have difficulty walking or climbing stairs?: Yes Weakness of Legs: None Weakness of Arms/Hands: None  Home Assistive Devices/Equipment Home Assistive Devices/Equipment: None  Therapy Consults (therapy consults require a physician order) PT Evaluation Needed: No OT Evalulation Needed: No SLP Evaluation Needed: No Abuse/Neglect Assessment (Assessment to be complete while patient is alone) Physical Abuse: Denies Verbal Abuse: Denies Sexual Abuse: Denies Exploitation of patient/patient's resources: Denies Self-Neglect: Denies Values / Beliefs Cultural Requests During Hospitalization: None Spiritual Requests During Hospitalization: None Consults Spiritual Care Consult Needed: No Social Work Consult Needed: No Merchant navy officer (For Healthcare) Advance Directive: Patient does not have advance directive Pre-existing out of facility DNR order (yellow form or pink MOST form): No    Additional Information 1:1 In Past 12 Months?: No CIRT Risk: No Elopement Risk: No Does patient have medical clearance?: Yes     Disposition:  Disposition Initial Assessment Completed for this Encounter: Yes Disposition of Patient: Inpatient treatment program Type of inpatient treatment program: Adult  On Site Evaluation by:   Reviewed with Physician:     Macon Large 08/14/2012 9:51 PM

## 2012-08-14 NOTE — BH Assessment (Signed)
BHH Assessment Progress Note    Pt will go to [facility] to [phone]  Accepted by Karleen Hampshire PA to Dr. Dub Mikes.  Pt can go by security to [facility] and nurses can call report when appropriate.

## 2012-08-14 NOTE — Patient Instructions (Addendum)
Ambulance will transport you to [facility] hospital ER.

## 2012-08-15 ENCOUNTER — Inpatient Hospital Stay (HOSPITAL_COMMUNITY)
Admission: EM | Admit: 2012-08-15 | Discharge: 2012-08-22 | DRG: 885 | Disposition: A | Discharge status: Home / Self Care | Payer: No Typology Code available for payment source | Source: Ambulatory Visit | Attending: Psychiatry | Admitting: Psychiatry

## 2012-08-15 ENCOUNTER — Encounter (HOSPITAL_COMMUNITY): Payer: Self-pay

## 2012-08-15 DIAGNOSIS — Z79899 Other long term (current) drug therapy: Secondary | ICD-10-CM

## 2012-08-15 DIAGNOSIS — G40909 Epilepsy, unspecified, not intractable, without status epilepticus: Secondary | ICD-10-CM | POA: Diagnosis present

## 2012-08-15 DIAGNOSIS — R45851 Suicidal ideations: Secondary | ICD-10-CM

## 2012-08-15 DIAGNOSIS — F332 Major depressive disorder, recurrent severe without psychotic features: Principal | ICD-10-CM | POA: Diagnosis present

## 2012-08-15 DIAGNOSIS — F988 Other specified behavioral and emotional disorders with onset usually occurring in childhood and adolescence: Secondary | ICD-10-CM

## 2012-08-15 DIAGNOSIS — G47419 Narcolepsy without cataplexy: Secondary | ICD-10-CM | POA: Diagnosis present

## 2012-08-15 DIAGNOSIS — F329 Major depressive disorder, single episode, unspecified: Secondary | ICD-10-CM

## 2012-08-15 DIAGNOSIS — F411 Generalized anxiety disorder: Secondary | ICD-10-CM | POA: Diagnosis present

## 2012-08-15 DIAGNOSIS — E119 Type 2 diabetes mellitus without complications: Secondary | ICD-10-CM | POA: Diagnosis present

## 2012-08-15 DIAGNOSIS — F32A Depression, unspecified: Secondary | ICD-10-CM

## 2012-08-15 MED ORDER — LEVETIRACETAM ER 500 MG PO TB24
1500.0000 mg | ORAL_TABLET | Freq: Two times a day (BID) | ORAL | Status: DC
  Administered 2012-08-15 – 2012-08-22 (×15): 1500 mg via ORAL
  Filled 2012-08-15 (×19): qty 3

## 2012-08-15 MED ORDER — PANTOPRAZOLE SODIUM 20 MG PO TBEC
20.0000 mg | DELAYED_RELEASE_TABLET | Freq: Every day | ORAL | Status: DC
  Administered 2012-08-15 – 2012-08-22 (×8): 20 mg via ORAL
  Filled 2012-08-15 (×10): qty 1

## 2012-08-15 MED ORDER — ROPINIROLE HCL 1 MG PO TABS
3.0000 mg | ORAL_TABLET | Freq: Every day | ORAL | Status: DC
  Administered 2012-08-15 – 2012-08-21 (×8): 3 mg via ORAL
  Filled 2012-08-15 (×11): qty 3

## 2012-08-15 MED ORDER — LORATADINE 10 MG PO TABS
10.0000 mg | ORAL_TABLET | Freq: Every day | ORAL | Status: DC
  Administered 2012-08-15 – 2012-08-22 (×8): 10 mg via ORAL
  Filled 2012-08-15 (×10): qty 1

## 2012-08-15 MED ORDER — MAGNESIUM HYDROXIDE 400 MG/5ML PO SUSP: 30.0000 mL | Freq: Every day | ORAL | Status: DC | PRN

## 2012-08-15 MED ORDER — SERTRALINE HCL 25 MG PO TABS
25.0000 mg | ORAL_TABLET | Freq: Every day | ORAL | Status: DC
  Administered 2012-08-15 – 2012-08-17 (×3): 25 mg via ORAL
  Filled 2012-08-15 (×6): qty 1

## 2012-08-15 MED ORDER — ARMODAFINIL 250 MG PO TABS
125.0000 mg | ORAL_TABLET | Freq: Every day | ORAL | Status: DC
  Filled 2012-08-15 (×4): qty 1

## 2012-08-15 MED ORDER — VILAZODONE HCL 10 MG PO TABS
10.0000 mg | ORAL_TABLET | Freq: Every day | ORAL | Status: DC
  Filled 2012-08-15 (×2): qty 1

## 2012-08-15 MED ORDER — DIPHENHYDRAMINE HCL 25 MG PO CAPS
50.0000 mg | ORAL_CAPSULE | ORAL | Status: DC | PRN
  Administered 2012-08-15: 50 mg via ORAL
  Filled 2012-08-15: qty 1

## 2012-08-15 MED ORDER — METFORMIN HCL 500 MG PO TABS
500.0000 mg | ORAL_TABLET | Freq: Two times a day (BID) | ORAL | Status: DC
  Administered 2012-08-15 – 2012-08-22 (×15): 500 mg via ORAL
  Filled 2012-08-15 (×19): qty 1

## 2012-08-15 MED ORDER — ACETAMINOPHEN 325 MG PO TABS
650.0000 mg | ORAL_TABLET | Freq: Four times a day (QID) | ORAL | Status: DC | PRN
Start: 2012-08-15 — End: 2012-08-22
  Administered 2012-08-15 – 2012-08-16 (×3): 650 mg via ORAL

## 2012-08-15 MED ORDER — CLONAZEPAM 1 MG PO TABS
1.0000 mg | ORAL_TABLET | Freq: Two times a day (BID) | ORAL | Status: DC | PRN
  Administered 2012-08-15 – 2012-08-18 (×4): 1 mg via ORAL
  Filled 2012-08-15 (×4): qty 1

## 2012-08-15 MED ORDER — TRAZODONE HCL 50 MG PO TABS
50.0000 mg | ORAL_TABLET | Freq: Every evening | ORAL | Status: DC | PRN
  Administered 2012-08-15 – 2012-08-17 (×5): 50 mg via ORAL
  Filled 2012-08-15 (×11): qty 1

## 2012-08-15 MED ORDER — ALUM & MAG HYDROXIDE-SIMETH 200-200-20 MG/5ML PO SUSP: 30.0000 mL | ORAL | Status: DC | PRN

## 2012-08-15 NOTE — Tx Team (Signed)
Interdisciplinary Treatment Plan Update (Adult)  Date: 08/15/2012   Time Reviewed: 11:24 AM  Progress in Treatment:  Attending groups: No. Participating in groups:  No. Taking medication as prescribed: Yes  Tolerating medication: Yes  Family/Significant othe contact made: Not yet. SPE required for this pt.   Patient understands diagnosis: Yes, AEB seeking treatment for depression, mood stabilization, and SI. Discussing patient identified problems/goals with staff: Yes  Medical problems stabilized or resolved: Yes  Denies suicidal/homicidal ideation: Passive SI. Able to contract for safety. Patient has not harmed self or Others: Yes  New problem(s) identified: Pt currently not attending groups.She will be moving to 500 hall when bed becomes available and will program on 500. Discharge Plan or Barriers: Pt and CSW will continue to explore aftercare plans. Pt not attending d/c planning group at this time.  Additional comments: [name] is an 37 y.o. female.  Pt depressed and was at PCP today and MD was concerned about pt and recommended pt come to ED. Pt appears to be having some side effects to a newer medication, pt reports being depressed and having suicidal thoughts.  See PA note. Pt denies HI and AVH and SA. Pt has not had inptx in the past. Pt is cooperative and appears invested in tx. Pt has a flat affect, Ox3, judgment impaired, speech soft but clear, ADLs appropriate, pt was cooperative, anxious  Reason for Continuation of Hospitalization: SI Mood stabilization/Depression Medication management Estimated length of stay: 3-5 days  For review of initial/current patient goals, please see plan of care.  Attendees:  Patient:    Family:    Physician: Geoffery Lyons MD 08/15/2012 11:27 AM   Nursing: Philippa Chester RN 08/15/2012 11:27 AM   Clinical Social Worker Jaleisa Brose Smart, LCSWA  08/15/2012 11:27 AM   Other: Rayfield Citizen RN 08/15/2012 11:27 AM   Other: Aggie PA 08/15/2012 11:28 AM   Other:  Darden Dates Nurse CM 08/15/2012 11:29 AM   Other:    Scribe for Treatment Team:  [employer] LCSWA 08/15/2012 11:29 AM

## 2012-08-15 NOTE — Progress Notes (Signed)
Patient ID: [name], female   DOB: 13-Aug-1975, 37 y.o.   MRN: [id number] Patient remains with sad, depressed mood.  She rates her depression as a 10 out of 10.  Patient denies any SI/HI/AVH.  Patient is appropriate for the 500 hall and will be moved as soon as a bed becomes available.  Continue to monitor medication management and MD orders.  Safety checks completed every 15 minutes per protocol.  Patient's behavior has been appropriate.

## 2012-08-15 NOTE — Tx Team (Signed)
Initial Interdisciplinary Treatment Plan  PATIENT STRENGTHS: (choose at least two) Capable of independent living General fund of knowledge Supportive family/friends  PATIENT STRESSORS: Health problems Medication change or noncompliance   PROBLEM LIST: Problem List/Patient Goals Date to be addressed Date deferred Reason deferred Estimated date of resolution  Depression                                                       DISCHARGE CRITERIA:  Ability to meet basic life and health needs Improved stabilization in mood, thinking, and/or behavior Medical problems require only outpatient monitoring Reduction of life-threatening or endangering symptoms to within safe limits Verbal commitment to aftercare and medication compliance  PRELIMINARY DISCHARGE PLAN: Attend aftercare/continuing care group Outpatient therapy Return to previous living arrangement  PATIENT/FAMIILY INVOLVEMENT: This treatment plan has been presented to and reviewed with the patient, [name], and/or family member, .  The patient and family have been given the opportunity to ask questions and make suggestions.  Andrena Mews 08/15/2012, 1:30 AM

## 2012-08-15 NOTE — Progress Notes (Signed)
Adult Psychoeducational Group Note  Date:  08/15/2012 Time:  11:56 AM  Group Topic/Focus:  Recovery Goals:   The focus of this group is to identify appropriate goals for recovery and establish a plan to achieve them.  Participation Level:  Did Not Attend  Participation Quality:  None  Affect:  None  Cognitive:  None  Insight: None  Engagement in Group:  None  Modes of Intervention:  None  Additional Comments:  Pt was in bed and did not attend group  Tora Perches N 08/15/2012, 11:56 AM

## 2012-08-15 NOTE — BHH Group Notes (Signed)
[facility] LCSW Group Therapy  08/15/2012 3:46 PM  Type of Therapy:  Group Therapy  Participation Level:  Did Not Attend  Smart, Herbert Seta 08/15/2012, 3:46 PM

## 2012-08-15 NOTE — BHH Group Notes (Signed)
[facility] LCSW Aftercare Discharge Planning Group Note   08/15/2012 11:23 AM  Participation Quality:  DID NOT ATTEND    Smart, Herbert Seta

## 2012-08-15 NOTE — Progress Notes (Signed)
Pt is a 37 year old female admitted with depression   She was having suicidal ideation prior to coming into the hospital but denies same at present   She reports hopelessness and not being able to do the things she normally does   Pt reports hopelessness and difficulty concentrating  Pt complains of the rash on her back still itching    She was oriented to the unit and offered nourishment  She received medications and effectiveness evaluated  Verbal support given  Q 15 min checks   Pt remains safe
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
## 2012-08-15 NOTE — BHH Suicide Risk Assessment (Signed)
Suicide Risk Assessment  Admission Assessment     Nursing information obtained from:    Demographic factors:    Current Mental Status:    Loss Factors:    Historical Factors:    Risk Reduction Factors:     CLINICAL FACTORS:   Depression:   Anhedonia Hopelessness Insomnia Severe  COGNITIVE FEATURES THAT CONTRIBUTE TO RISK:  Closed-mindedness Polarized thinking Thought constriction (tunnel vision)    SUICIDE RISK:   Moderate:  Frequent suicidal ideation with limited intensity, and duration, some specificity in terms of plans, no associated intent, good self-control, limited dysphoria/symptomatology, some risk factors present, and identifiable protective factors, including available and accessible social support.  PLAN OF CARE: Supportive approach/coping skills                               Reassess her medications : will start Zoloft and consider augmenting with Abilify. Also consider treating the narcolepsy with amphetamines.  I certify that inpatient services furnished can reasonably be expected to improve the patient's condition.  [name] A 08/15/2012, 2:05 PM
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
## 2012-08-16 LAB — GLUCOSE, CAPILLARY
Glucose-Capillary: 149 mg/dL — ABNORMAL HIGH (ref 70–99)
Glucose-Capillary: 130 mg/dL — ABNORMAL HIGH (ref 70–99)
Glucose-Capillary: 172 mg/dL — ABNORMAL HIGH (ref 70–99)

## 2012-08-16 MED ORDER — IBUPROFEN 600 MG PO TABS
600.0000 mg | ORAL_TABLET | Freq: Four times a day (QID) | ORAL | Status: DC | PRN
  Administered 2012-08-16 – 2012-08-21 (×7): 600 mg via ORAL
  Filled 2012-08-16 (×6): qty 1

## 2012-08-16 MED ORDER — IBUPROFEN 600 MG PO TABS
ORAL_TABLET | ORAL | Status: AC
  Filled 2012-08-16: qty 1

## 2012-08-16 NOTE — Progress Notes (Signed)
Writer entered patients room and observed her lying in bed resting. Patient c/o having a headache and requested motrin instead of tylenol because she reports that it doesn't work. Writer received an order for motrin from NP on call. Patient received snack and took her hs medications. Patient denies si/hi/a/v hallucinations. Patient reports depression at a 10. Writer encouraged patient to attempt to attend groups and come out of her room as much as possible and be up in the dayroom and not spend so much time alone thinking. Patient denies si/hi/a/v hallucinations, safety maintained with 15 min checks, will continue to monitor.

## 2012-08-16 NOTE — Progress Notes (Signed)
The focus of this group is to help patients establish daily goals to achieve during treatment and discuss how the patient can incorporate goal setting into their daily lives to aide in recovery.  The theme for today is healthy coping skills. Pt did not attend group. 

## 2012-08-16 NOTE — Progress Notes (Signed)
Patient ID: [name], female   DOB: 04/13/1975, 37 y.o.   MRN: [id number]  D:  Pt endorses passive SI, but contracts for safety.  Pt denies HI/AVH. Pt is pleasant and cooperative.   A: Pt was offered support and encouragement. Pt was given scheduled medications. Pt was encourage to attend groups. Q 15 minute checks were done for safety.   R:Pt attends groups and interacts well with peers and staff. Pt is taking medication. Pt has no complaints at this time.Pt receptive to treatment and safety maintained on unit.

## 2012-08-16 NOTE — Progress Notes (Signed)
Adult Psychoeducational Group Note  Date:  08/16/2012 Time:  3:21 PM  Group Topic/Focus:  Identifying Needs:   The focus of this group is to help patients identify their personal needs that have been historically problematic and identify healthy behaviors to address their needs.  Participation Level:  Did Not Attend  Participation Quality:  Did not attend  Affect:  Did not attend  Cognitive:  Did not attend  Insight: None  Engagement in Group:  Did not attend  Modes of Intervention:  Did not attend  Additional Comments:  Patients will increase their understanding of their needs by listing at least 2 personal needs that they see as an issue that brought them into the hospital. Patient will identify at least one benefit and obstacle in continuing to use the same unhealthy behaviors Pt. will identify one healthy behavior to begin using within the first 24 hours of group. Pt did not attend group.   [name] 08/16/2012, 3:21 PM

## 2012-08-16 NOTE — Progress Notes (Signed)
[facility] MD Progress Note  08/16/2012 3:52 PM [name]  MRN:  [id number] Subjective:  Patient observed resting in bed complaining of headache. Rates her depression at ten. Has passive SI with no plan still. Reports that her husband is on disability for chronic pain and that she is trying to also get on disability for depression and seizures. Reports a history of migraines which only respond to vicodin.   Diagnosis:   Axis I: Anxiety Disorder NOS and Major Depression, Recurrent severe Axis II: Deferred Axis III:  Past Medical History  Diagnosis Date  . Seizures   . Narcolepsy   . Restless leg   . Depression   . Hyperlipidemia   . Allergic rhinitis   . Narcolepsy   . Diabetes mellitus without complication   . Migraine    Axis IV: other psychosocial or environmental problems Axis V: 41-50 serious symptoms  ADL's:  Intact  Sleep: Fair  Appetite:  Fair  Suicidal Ideation:  Passive SI no plan Homicidal Ideation:  Denies AEB (as evidenced by):  Psychiatric Specialty Exam: Review of Systems  Constitutional: Negative.   Eyes: Negative.   Respiratory: Negative.   Cardiovascular: Negative.   Gastrointestinal: Negative.   Genitourinary: Negative.   Musculoskeletal: Negative.   Skin: Negative.   Neurological: Positive for headaches.  Endo/Heme/Allergies: Negative.   Psychiatric/Behavioral: Positive for depression and suicidal ideas. Negative for hallucinations, memory loss and substance abuse. The patient is not nervous/anxious and does not have insomnia.     Blood pressure 110/80, pulse 94, temperature 98.5 F (36.9 C), temperature source Oral, resp. rate 16, height 5\' 6"  (1.676 m), weight 87.998 kg (194 lb).Body mass index is 31.33 kg/(m^2).  General Appearance: Casual and Fairly Groomed  Eye Contact::  Minimal  Speech:  Slow  Volume:  Decreased  Mood:  Depressed  Affect:  Flat  Thought Process:  Goal Directed  Orientation:  Full (Time, Place, and Person)  Thought  Content:  WDL  Suicidal Thoughts:  Yes.  without intent/plan  Homicidal Thoughts:  No  Memory:  Immediate;   Good Recent;   Good Remote;   Good  Judgement:  Impaired  Insight:  Fair  Psychomotor Activity:  Normal  Concentration:  Fair  Recall:  Fair  Akathisia:  No  Handed:  Right  AIMS (if indicated):     Assets:  Communication Skills Desire for Improvement Intimacy Leisure Time Physical Health Resilience  Sleep:  Number of Hours: 6.25   Current Medications: Current Facility-Administered Medications  Medication Dose Route Frequency Provider Last Rate Last Dose  . acetaminophen (TYLENOL) tablet 650 mg  650 mg Oral Q6H PRN Kerry Hough, PA-C   650 mg at 08/16/12 0827  . alum & mag hydroxide-simeth (MAALOX/MYLANTA) 200-200-20 MG/5ML suspension 30 mL  30 mL Oral Q4H PRN Kerry Hough, PA-C      . clonazePAM Scarlette Calico) tablet 1 mg  1 mg Oral BID PRN Kerry Hough, PA-C   1 mg at 08/16/12 0827  . diphenhydrAMINE (BENADRYL) capsule 50 mg  50 mg Oral Q4H PRN Kerry Hough, PA-C   50 mg at 08/15/12 0143  . levETIRAcetam (KEPPRA XR) 24 hr tablet 1,500 mg  1,500 mg Oral BID Kerry Hough, PA-C   1,500 mg at 08/16/12 0825  . loratadine (CLARITIN) tablet 10 mg  10 mg Oral Daily Kerry Hough, PA-C   10 mg at 08/16/12 0825  . magnesium hydroxide (MILK OF MAGNESIA) suspension 30 mL  30 mL Oral Daily  PRN Kerry Hough, PA-C      . metFORMIN (GLUCOPHAGE) tablet 500 mg  500 mg Oral BID WC Kerry Hough, PA-C   500 mg at 08/16/12 0825  . pantoprazole (PROTONIX) EC tablet 20 mg  20 mg Oral Daily Kerry Hough, PA-C   20 mg at 08/16/12 0825  . rOPINIRole (REQUIP) tablet 3 mg  3 mg Oral QHS Kerry Hough, PA-C   3 mg at 08/15/12 2201  . sertraline (ZOLOFT) tablet 25 mg  25 mg Oral Daily Rachael Fee, MD   25 mg at 08/16/12 0825  . traZODone (DESYREL) tablet 50 mg  50 mg Oral QHS,MR X 1 Kerry Hough, PA-C   50 mg at 08/15/12 2201    Lab Results:  Results for orders placed  during the hospital encounter of 08/15/12 (from the past 48 hour(s))  GLUCOSE, CAPILLARY     Status: Abnormal   Collection Time    08/15/12  5:15 PM      Result Value Range   Glucose-Capillary 149 (*) 70 - 99 mg/dL   Comment 1 Notify RN    GLUCOSE, CAPILLARY     Status: Abnormal   Collection Time    08/16/12  6:17 AM      Result Value Range   Glucose-Capillary 130 (*) 70 - 99 mg/dL    Physical Findings: AIMS: Facial and Oral Movements Muscles of Facial Expression: None, normal Lips and Perioral Area: None, normal Jaw: None, normal Tongue: None, normal,Extremity Movements Upper (arms, wrists, hands, fingers): None, normal Lower (legs, knees, ankles, toes): None, normal, Trunk Movements Neck, shoulders, hips: None, normal, Overall Severity Severity of abnormal movements (highest score from questions above): None, normal Incapacitation due to abnormal movements: None, normal Patient's awareness of abnormal movements (rate only patient's report): No Awareness, Dental Status Current problems with teeth and/or dentures?: No Does patient usually wear dentures?: No  CIWA:    COWS:     Treatment Plan Summary: Daily contact with patient to assess and evaluate symptoms and progress in treatment Medication management  Plan: Continue crisis management and stabilization.  Medication management: Tolerating Zoloft that was started yesterday. Will increase to 50 mg tomorrow.  Encouraged patient to attend groups and participate in group counseling sessions and activities.  Discharge plan in progress.  Address health issues: Vitals reviewed and stable. Declines treatments for headache other than with opiates. Offer supportive therapy with headache such as cold packs, tylenol prn and dim lights in room.   Continue current treatment plan.   Medical Decision Making Problem Points:  Established problem, stable/improving (1) and Review of psycho-social stressors (1) Data Points:  Review of  medication regiment & side effects (2)  I certify that inpatient services furnished can reasonably be expected to improve the patient's condition.   Nesanel Aguila NP-C 08/16/2012, 3:52 PM

## 2012-08-16 NOTE — Progress Notes (Signed)
D) Pt has been in her room much of the shift. States that she has a headache and is having trouble getting rid of it. Was given tylenol earlier today. Pt fell asleep and rested for awhile. . Rates her depression and hopelessness both at a 10. Has SI on and off. A) Pt given cold packs and tylenol. Given support. Verbal contract to remain safe on the unit. R) Pt contracts for her safety in the hospital.

## 2012-08-16 NOTE — [facility] Group Notes (Signed)
[facility] Group Notes: (Clinical Social Work)   08/16/2012      Type of Therapy:  Group Therapy   Participation Level:  Did Not Attend    Ambrose Mantle, LCSW 08/16/2012, 4:44 PM

## 2012-08-17 LAB — GLUCOSE, CAPILLARY
Glucose-Capillary: 158 mg/dL — ABNORMAL HIGH (ref 70–99)
Glucose-Capillary: 194 mg/dL — ABNORMAL HIGH (ref 70–99)

## 2012-08-17 MED ORDER — SERTRALINE HCL 50 MG PO TABS
50.0000 mg | ORAL_TABLET | Freq: Every day | ORAL | Status: DC
  Administered 2012-08-18 – 2012-08-22 (×5): 50 mg via ORAL
  Filled 2012-08-17 (×3): qty 1
  Filled 2012-08-17: qty 14
  Filled 2012-08-17 (×4): qty 1

## 2012-08-17 NOTE — Progress Notes (Signed)
Adult Psychoeducational Group Note  Date:  08/17/2012 Time:  1:12 PM  Group Topic/Focus:  Making Healthy Choices:   The focus of this group is to help patients identify negative/unhealthy choices they were using prior to admission and identify positive/healthier coping strategies to replace them upon discharge.  Participation Level:  Active  Participation Quality:  Appropriate, Attentive, Sharing and Supportive  Affect:  Appropriate  Cognitive:  Appropriate  Insight: Appropriate, Good and Improving  Engagement in Group:  Developing/Improving, Engaged, Improving and Supportive  Modes of Intervention:  Discussion, Role-play, Socialization and Support  Additional Comments:  Pt attended group and actively participated and shared appropriately.  [name] 08/17/2012, 1:12 PM

## 2012-08-17 NOTE — Progress Notes (Signed)
Psychoeducational Group Note  Date:  08/16/2012 Time:  2000  Group Topic/Focus:  Wrap-Up Group:   The focus of this group is to help patients review their daily goal of treatment and discuss progress on daily workbooks.  Participation Level: Did Not Attend  Participation Quality:  Not Applicable  Affect:  Not Applicable  Cognitive:  Not Applicable  Insight:  Not Applicable  Engagement in Group: Not Applicable  Additional Comments:  The patient did not attend group since she was asleep in her bed.   Shaheem Pichon S 08/17/2012, 12:05 AM

## 2012-08-17 NOTE — Progress Notes (Signed)
Patient ID: [name], female   DOB: 11-10-1975, 37 y.o.   MRN: [id number] [facility] MD Progress Note  08/17/2012 3:30 PM [name]  MRN:  [id number] Subjective:  Patient is observed lying in bed working on her Saturday packet stating "I feel better today so I've been going to groups but I wanted to catch up on what I missed yesterday. Rates her depression at eight and still endorses passive SI. The patient appears with depressed affect.   Diagnosis:   Axis I: Anxiety Disorder NOS and Major Depression, Recurrent severe Axis II: Deferred Axis III:  Past Medical History  Diagnosis Date  . Seizures   . Narcolepsy   . Restless leg   . Depression   . Hyperlipidemia   . Allergic rhinitis   . Narcolepsy   . Diabetes mellitus without complication   . Migraine    Axis IV: other psychosocial or environmental problems Axis V: 41-50 serious symptoms  ADL's:  Intact  Sleep: Fair  Appetite:  Fair  Suicidal Ideation:  Passive SI no plan Homicidal Ideation:  Denies AEB (as evidenced by):  Psychiatric Specialty Exam: Review of Systems  Constitutional: Negative.   Eyes: Negative.   Respiratory: Negative.   Cardiovascular: Negative.   Gastrointestinal: Negative.   Genitourinary: Negative.   Musculoskeletal: Negative.   Skin: Negative.   Neurological: Negative for headaches.  Endo/Heme/Allergies: Negative.   Psychiatric/Behavioral: Positive for depression and suicidal ideas. Negative for hallucinations, memory loss and substance abuse. The patient is not nervous/anxious and does not have insomnia.     Blood pressure 111/78, pulse 93, temperature 98.2 F (36.8 C), temperature source Oral, resp. rate 18, height 5\' 6"  (1.676 m), weight 87.998 kg (194 lb).Body mass index is 31.33 kg/(m^2).  General Appearance: Casual and Fairly Groomed  Eye Contact::  Minimal  Speech:  Slow  Volume:  Decreased  Mood:  Depressed  Affect:  Flat  Thought Process:  Goal Directed  Orientation:  Full  (Time, Place, and Person)  Thought Content:  WDL  Suicidal Thoughts:  Yes.  without intent/plan  Homicidal Thoughts:  No  Memory:  Immediate;   Good Recent;   Good Remote;   Good  Judgement:  Impaired  Insight:  Fair  Psychomotor Activity:  Normal  Concentration:  Fair  Recall:  Fair  Akathisia:  No  Handed:  Right  AIMS (if indicated):     Assets:  Communication Skills Desire for Improvement Intimacy Leisure Time Physical Health Resilience  Sleep:  Number of Hours: 6.75   Current Medications: Current Facility-Administered Medications  Medication Dose Route Frequency Provider Last Rate Last Dose  . acetaminophen (TYLENOL) tablet 650 mg  650 mg Oral Q6H PRN Kerry Hough, PA-C   650 mg at 08/16/12 0827  . alum & mag hydroxide-simeth (MAALOX/MYLANTA) 200-200-20 MG/5ML suspension 30 mL  30 mL Oral Q4H PRN Kerry Hough, PA-C      . clonazePAM Scarlette Calico) tablet 1 mg  1 mg Oral BID PRN Kerry Hough, PA-C   1 mg at 08/17/12 0858  . diphenhydrAMINE (BENADRYL) capsule 50 mg  50 mg Oral Q4H PRN Kerry Hough, PA-C   50 mg at 08/15/12 0143  . ibuprofen (ADVIL,MOTRIN) tablet 600 mg  600 mg Oral Q6H PRN Fransisca Kaufmann, NP   600 mg at 08/17/12 0942  . levETIRAcetam (KEPPRA XR) 24 hr tablet 1,500 mg  1,500 mg Oral BID Kerry Hough, PA-C   1,500 mg at 08/17/12 0856  . loratadine (CLARITIN) tablet  10 mg  10 mg Oral Daily Kerry Hough, PA-C   10 mg at 08/17/12 0856  . magnesium hydroxide (MILK OF MAGNESIA) suspension 30 mL  30 mL Oral Daily PRN Kerry Hough, PA-C      . metFORMIN (GLUCOPHAGE) tablet 500 mg  500 mg Oral BID WC Kerry Hough, PA-C   500 mg at 08/17/12 0856  . pantoprazole (PROTONIX) EC tablet 20 mg  20 mg Oral Daily Kerry Hough, PA-C   20 mg at 08/17/12 0856  . rOPINIRole (REQUIP) tablet 3 mg  3 mg Oral QHS Kerry Hough, PA-C   3 mg at 08/16/12 2128  . [START ON 08/18/2012] sertraline (ZOLOFT) tablet 50 mg  50 mg Oral Daily Fransisca Kaufmann, NP      . traZODone  (DESYREL) tablet 50 mg  50 mg Oral QHS,MR X 1 Kerry Hough, PA-C   50 mg at 08/16/12 2128    Lab Results:  Results for orders placed during the hospital encounter of 08/15/12 (from the past 48 hour(s))  GLUCOSE, CAPILLARY     Status: Abnormal   Collection Time    08/15/12  5:15 PM      Result Value Range   Glucose-Capillary 149 (*) 70 - 99 mg/dL   Comment 1 Notify RN    GLUCOSE, CAPILLARY     Status: Abnormal   Collection Time    08/16/12  6:17 AM      Result Value Range   Glucose-Capillary 130 (*) 70 - 99 mg/dL  GLUCOSE, CAPILLARY     Status: Abnormal   Collection Time    08/16/12  5:15 PM      Result Value Range   Glucose-Capillary 172 (*) 70 - 99 mg/dL  GLUCOSE, CAPILLARY     Status: Abnormal   Collection Time    08/17/12  6:06 AM      Result Value Range   Glucose-Capillary 158 (*) 70 - 99 mg/dL    Physical Findings: AIMS: Facial and Oral Movements Muscles of Facial Expression: None, normal Lips and Perioral Area: None, normal Jaw: None, normal Tongue: None, normal,Extremity Movements Upper (arms, wrists, hands, fingers): None, normal Lower (legs, knees, ankles, toes): None, normal, Trunk Movements Neck, shoulders, hips: None, normal, Overall Severity Severity of abnormal movements (highest score from questions above): None, normal Incapacitation due to abnormal movements: None, normal Patient's awareness of abnormal movements (rate only patient's report): No Awareness, Dental Status Current problems with teeth and/or dentures?: No Does patient usually wear dentures?: No  CIWA:    COWS:     Treatment Plan Summary: Daily contact with patient to assess and evaluate symptoms and progress in treatment Medication management  Plan: Continue crisis management and stabilization.  Medication management: Tolerating Zoloft will increase to 50 mg starting tomorrow.  Encouraged patient to attend groups and participate in group counseling sessions and activities.   Discharge plan in progress.  Address health issues: Vitals reviewed and stable. Reports headache resolved with prn motrin.  Continue current treatment plan.   Medical Decision Making Problem Points:  Established problem, stable/improving (1) and Review of psycho-social stressors (1) Data Points:  Review of medication regiment & side effects (2)  I certify that inpatient services furnished can reasonably be expected to improve the patient's condition.   Camielle Sizer NP-C 08/17/2012, 3:30 PM

## 2012-08-17 NOTE — [facility] Group Notes (Signed)
[facility] Group Notes:  (Clinical Social Work)  08/17/2012   3:00-4:00PM  Summary of Progress/Problems:   The main focus of today's process group was to   identify the patient's current support system and decide on other supports that can be put in place.  The picture on workbook was used to discuss why additional supports are needed, and a hand-out was distributed with four definitions/levels of support, then used to talk about how patients have given and received all different kinds of support.  An emphasis was placed on using counselor, doctor, therapy groups, 12-step groups, and problem-specific support groups to expand supports.  The patient identified herself as one person in group who has participated in support groups, so was asked to describe this experience to the group.  She started a support group at [facility], and found it very helpful and supportive, but it was quickly disbanded as the leader left.  Now she is feeling distrustful.  Type of Therapy:  Process Group  Participation Level:  Active  Participation Quality:  Attentive and Sharing  Affect:  Blunted and Depressed  Cognitive:  Oriented  Insight:  Engaged  Engagement in Therapy:  Engaged  Modes of Intervention:  Education,  Support and [employer], LCSW 08/17/2012, 4:40 PM

## 2012-08-17 NOTE — Progress Notes (Signed)
D) Pt has been up and out of the bed today and interacting with her peers and attending the groups. Rates her depression and hopelessness both at a 10 and has thoughts of SI on and off. Pt is interacting more in groups and affect is much brighter. A) Given support and reassurance and praise. Contracting with Pt for her safety.  R) Contracts for safety while in the hospital. Brighter affect this afternoon.

## 2012-08-17 NOTE — Progress Notes (Signed)
The focus of this group is to help patients establish daily goals to achieve during treatment and discuss how the patient can incorporate goal setting into their daily lives to aide in recovery.  The topic for this group was three things to be grateful for: being out of bed today, my kids, being at [facility].

## 2012-08-18 ENCOUNTER — Ambulatory Visit: Payer: Medicaid Other | Admitting: Family

## 2012-08-18 LAB — GLUCOSE, CAPILLARY

## 2012-08-18 MED ORDER — CLONAZEPAM 0.5 MG PO TABS
0.5000 mg | ORAL_TABLET | Freq: Two times a day (BID) | ORAL | Status: DC | PRN
  Administered 2012-08-18 – 2012-08-21 (×5): 0.5 mg via ORAL
  Filled 2012-08-18 (×5): qty 1

## 2012-08-18 MED ORDER — QUETIAPINE FUMARATE 25 MG PO TABS
25.0000 mg | ORAL_TABLET | Freq: Every day | ORAL | Status: DC
  Administered 2012-08-18 – 2012-08-21 (×4): 25 mg via ORAL
  Filled 2012-08-18: qty 1
  Filled 2012-08-18: qty 14
  Filled 2012-08-18 (×6): qty 1

## 2012-08-18 NOTE — [facility] Group Notes (Signed)
[facility] LCSW Group Therapy          Overcoming Obstacles       1:15 -2:30        08/18/2012   1:00 PM     Type of Therapy:  Group Therapy  Participation Level:  Appropriate  Participation Quality:  Appropriate  Affect:  Appropriate, Alert  Cognitive:  Attentive Appropriate  Insight: Developing/Improving Engaged  Engagement in Therapy: Developing/Imprvoing Engaged  Modes of Intervention:  Discussion Exploration  Education Rapport BuildingProblem-Solving Support  Summary of Progress/Problems:  The main focus of today's group was overcoming  Obstacles.  Patient shared she is her obstacle.  She talked about how she shuts down and does not speak up for herself.  Patient was able to process the anger and resent she feels toward husband who is disabled due to drug use.  She shared she feels as though she has been punished - first because of his drug use and now in having to give up her life to take care of him.  Patient was commended for being able to shared her feelings openly and honestly.   Wynn Banker 08/18/2012    1:00 PM

## 2012-08-18 NOTE — Progress Notes (Signed)
Grief and Loss Group  Pts explored their experiences and emotions with grief and loss in this counseling group.  Pt was attentive and engaged for the entire group. She acknowledged appreciating other group members briefly, but did not otherwise respond verbally during the group.  Lenoard Aden Counseling Intern Haroldine Laws

## 2012-08-18 NOTE — [facility] Group Notes (Signed)
[facility] LCSW Aftercare Discharge Planning Group Note   08/18/2012 11:46 AM  Participation Quality:  Appropriate  Mood/Affect:  Appropriate, Depressed, Flat and Tearful  Depression Rating:  9  Anxiety Rating:  9  Thoughts of Suicide:  Yes  Will you contract for safety?   Yes  Current AVH:  No  Plan for Discharge/Comments:   Patient reports plans to return home with husband.  She will be referred to [facility] for follow up at discharge.  Transportation Means: Patient has transportation.   Supports:  Patient has a good support system.'  Deacon Gadbois, Joesph July

## 2012-08-18 NOTE — Progress Notes (Signed)
D - Pt up and active on the unit tonight, interacting with peers appropriately, laughing and making jokes. Pt attended group tonight and was attentive. Pt c/o of swelling in both ankles, trace edema noted to both ankles. Ice pack given for ankle swelling and encouraged pt to elevated her feet. Pt denies SI/HI or any auditory or visual hallucinations.  A - Pt offered encouragement and support through therapeutic conversation. Pt encouraged to speak with staff about any concerns or questions. Medications given as ordered.  R - Pt safety maintained wit Q 15 minute checks. Pt remains safe on the unit.

## 2012-08-18 NOTE — Tx Team (Addendum)
Interdisciplinary Treatment Plan Update (Adult)  Date: 08/18/2012   Time Reviewed: 11:26 AM  Progress in Treatment:  Attending groups: No. Participating in groups:  No. Taking medication as prescribed: Yes  Tolerating medication: Yes  Family/Significant othe contact made:No, but will ask patient for consent for collateral contact Patient understands diagnosis: Yes. Discussing patient identified problems/goals with staff: Yes  Medical problems stabilized or resolved: Yes  Denies suicidal/homicidal ideation: Patient is endorsing off/on SI but able to contract for safety. Patient has not harmed self or Others: Yes    New problem(s) identified:   Discharge Plan or Barriers: Outpatient follow up to be scheduled with [facility]  Additional comments: Patient continues to endorse off/on SI and rates depression and anxiety at nine - Hopelessness/Helplessness at ten  Reason for Continuation of Hospitalization: Suicidal  Ideation Anxiety Depression Medication management  Estimated length of stay:2-4 days  For review of initial/current patient goals, please see plan of care.   Attendees:  Patient:    Family:    Physician: Fransisca Kaufmann, NP  08/18/2012 11:26 AM   Nursing: Neill Loft, RN  08/18/2012 11:26 AM   Clinical Social Worker:  Juline Patch, LCSW 08/18/2012 11:26 AM   Other: Quintella Reichert,  RN 08/18/2012 11:26 AM   Other: Verne Spurr,  PA 08/18/2012 11:26 AM   Other:  08/18/2012 11:26 AM   Other:    Scribe for Treatment Team:  .Raider Valbuena, LCSW  11:26 AM

## 2012-08-18 NOTE — Progress Notes (Signed)
Patient ID: [name], female   DOB: [birth year], 37 y.o.   MRN: [id number] [facility] MD Progress Note  08/18/2012 3:38 PM Shakeria Robinette  MRN:  [id number] Subjective: Patient notes that her sleep is " not good" and that her depression continues to be severe at 9/10, with anxiety at a 9/10.  Diagnosis:   Axis I: Anxiety Disorder NOS and Major Depression, Recurrent severe Axis II: Deferred Axis III:  Past Medical History  Diagnosis Date  . Seizures   . Narcolepsy   . Restless leg   . Depression   . Hyperlipidemia   . Allergic rhinitis   . Narcolepsy   . Diabetes mellitus without complication   . Migraine    Axis IV: other psychosocial or environmental problems Axis V: 41-50 serious symptoms  ADL's:  Intact  Sleep: poor  Appetite:  Fair  Suicidal Ideation:  Passive SI no plan Homicidal Ideation:  Denies AEB (as evidenced by):  Psychiatric Specialty Exam: Review of Systems  Constitutional: Negative.   Eyes: Negative.   Respiratory: Negative.   Cardiovascular: Negative.   Gastrointestinal: Negative.   Genitourinary: Negative.   Musculoskeletal: Negative.   Skin: Negative.   Neurological: Negative for headaches.  Endo/Heme/Allergies: Negative.   Psychiatric/Behavioral: Positive for depression and suicidal ideas. Negative for hallucinations, memory loss and substance abuse. The patient is not nervous/anxious and does not have insomnia.     Blood pressure 111/78, pulse 93, temperature 98.2 F (36.8 C), temperature source Oral, resp. rate 18, height 5\' 6"  (1.676 m), weight 87.998 kg (194 lb).Body mass index is 31.33 kg/(m^2).  General Appearance: Casual and Fairly Groomed  Eye Contact::  Minimal  Speech:  Slow  Volume:  Decreased  Mood:  Depressed  Affect:  Flat  Thought Process:  Goal Directed  Orientation:  Full (Time, Place, and Person)  Thought Content:  WDL  Suicidal Thoughts:  Yes.  without intent/plan  Homicidal Thoughts:  No  Memory:  Immediate;    Good Recent;   Good Remote;   Good  Judgement:  Impaired  Insight:  Fair  Psychomotor Activity:  Normal  Concentration:  Fair  Recall:  Fair  Akathisia:  No  Handed:  Right  AIMS (if indicated):     Assets:  Communication Skills Desire for Improvement Intimacy Leisure Time Physical Health Resilience  Sleep:  Number of Hours: 5.25   Current Medications: Current Facility-Administered Medications  Medication Dose Route Frequency Provider Last Rate Last Dose  . acetaminophen (TYLENOL) tablet 650 mg  650 mg Oral Q6H PRN Kerry Hough, PA-C   650 mg at 08/16/12 0827  . alum & mag hydroxide-simeth (MAALOX/MYLANTA) 200-200-20 MG/5ML suspension 30 mL  30 mL Oral Q4H PRN Kerry Hough, PA-C      . clonazePAM Scarlette Calico) tablet 1 mg  1 mg Oral BID PRN Kerry Hough, PA-C   1 mg at 08/18/12 0804  . diphenhydrAMINE (BENADRYL) capsule 50 mg  50 mg Oral Q4H PRN Kerry Hough, PA-C   50 mg at 08/15/12 0143  . ibuprofen (ADVIL,MOTRIN) tablet 600 mg  600 mg Oral Q6H PRN Fransisca Kaufmann, NP   600 mg at 08/18/12 0830  . levETIRAcetam (KEPPRA XR) 24 hr tablet 1,500 mg  1,500 mg Oral BID Kerry Hough, PA-C   1,500 mg at 08/18/12 4696  . loratadine (CLARITIN) tablet 10 mg  10 mg Oral Daily Kerry Hough, PA-C   10 mg at 08/18/12 0803  . magnesium hydroxide (MILK OF MAGNESIA) suspension 30  mL  30 mL Oral Daily PRN Kerry Hough, PA-C      . metFORMIN (GLUCOPHAGE) tablet 500 mg  500 mg Oral BID WC Kerry Hough, PA-C   500 mg at 08/18/12 0803  . pantoprazole (PROTONIX) EC tablet 20 mg  20 mg Oral Daily Kerry Hough, PA-C   20 mg at 08/18/12 0454  . rOPINIRole (REQUIP) tablet 3 mg  3 mg Oral QHS Kerry Hough, PA-C   3 mg at 08/17/12 2100  . sertraline (ZOLOFT) tablet 50 mg  50 mg Oral Daily Fransisca Kaufmann, NP   50 mg at 08/18/12 0803  . traZODone (DESYREL) tablet 50 mg  50 mg Oral QHS,MR X 1 Kerry Hough, PA-C   50 mg at 08/17/12 2249    Lab Results:  Results for orders placed during  the hospital encounter of 08/15/12 (from the past 48 hour(s))  GLUCOSE, CAPILLARY     Status: Abnormal   Collection Time    08/16/12  5:15 PM      Result Value Range   Glucose-Capillary 172 (*) 70 - 99 mg/dL  GLUCOSE, CAPILLARY     Status: Abnormal   Collection Time    08/17/12  6:06 AM      Result Value Range   Glucose-Capillary 158 (*) 70 - 99 mg/dL  GLUCOSE, CAPILLARY     Status: Abnormal   Collection Time    08/17/12  5:00 PM      Result Value Range   Glucose-Capillary 194 (*) 70 - 99 mg/dL   Comment 1 Notify RN    GLUCOSE, CAPILLARY     Status: Abnormal   Collection Time    08/18/12  6:22 AM      Result Value Range   Glucose-Capillary 136 (*) 70 - 99 mg/dL   Comment 1 Notify RN    GLUCOSE, CAPILLARY     Status: Abnormal   Collection Time    08/18/12 11:53 AM      Result Value Range   Glucose-Capillary 229 (*) 70 - 99 mg/dL   Comment 1 Notify RN      Physical Findings: AIMS: Facial and Oral Movements Muscles of Facial Expression: None, normal Lips and Perioral Area: None, normal Jaw: None, normal Tongue: None, normal,Extremity Movements Upper (arms, wrists, hands, fingers): None, normal Lower (legs, knees, ankles, toes): None, normal, Trunk Movements Neck, shoulders, hips: None, normal, Overall Severity Severity of abnormal movements (highest score from questions above): None, normal Incapacitation due to abnormal movements: None, normal Patient's awareness of abnormal movements (rate only patient's report): No Awareness, Dental Status Current problems with teeth and/or dentures?: No Does patient usually wear dentures?: No  CIWA:    COWS:     Treatment Plan Summary: Daily contact with patient to assess and evaluate symptoms and progress in treatment Medication management  Plan: Continue crisis management and stabilization.  Medication management: Tolerating Zoloft will increase to 50 mg starting tomorrow.  Will decrease klonopin to 0.5 BID. Will use low  dose seroquel 25mg  for sleep. Will also check thyroid level TSH to rule out hypothyroidism.  ELOS: 3-5 days.   Medical Decision Making Problem Points:  Established problem, stable/improving (1) and Review of psycho-social stressors (1) Data Points:  Review of medication regiment & side effects (2)  I certify that inpatient services furnished can reasonably be expected to improve the patient's condition.  Rona Ravens. Antara Brecheisen RPAC 4:14 PM 08/18/2012

## 2012-08-18 NOTE — Progress Notes (Signed)
D:  Per pt self inventory pt reports sleeping poor, energy level low, ability to pay attention poor, rates depression at a 9 out of 10 and hopelessness at a 9 out of 10, Passive SI on and off, contracts for safety, denies AH states taht she "saw the same face on the wall as my roommate."  CSW states that in am group pt and her roommate were holding hands and had to have clear boundaries set because of their enmeshment.    A:  Emotional support provided, Encouraged pt to continue with treatment plan and attend all group activities, q15 min checks maintained for safety.  R:  Pt is calm and cooperative and participates in groups and activities.

## 2012-08-18 NOTE — Progress Notes (Signed)
Writer spoke with patient and she reports that her day has been better and she did attend groups today and was up in the dayroom more and she actually felt a little better. Patient rates her depression this evening at a 9 and reports passive si and verbally contracts. Patient denies si/hi/a/v hallucinations. Support and encouragement offered for patient not isolating in her room, safety maintained on unit, will continue to monitor.

## 2012-08-18 NOTE — ED Provider Notes (Signed)
Medical screening examination/treatment/procedure(s) were performed by non-physician practitioner and as supervising physician I was immediately available for consultation/collaboration.   Christopher J. Pollina, MD 08/18/12 1921 

## 2012-08-18 NOTE — Progress Notes (Signed)
[facility] Group Notes:  (Nursing/MHT/Case Management/Adjunct)  Date:  08/17/2012 Time:  2000  Type of Therapy:  Psychoeducational Skills  Participation Level:  Active  Participation Quality:  Appropriate  Affect:  Appropriate  Cognitive:  Appropriate  Insight:  Improving  Engagement in Group:  Improving  Modes of Intervention:  Education  Summary of Progress/Problems:The patient stated that she had a good day today. To begin with, she stated that she spent less time in bed as compared to yesterday. Secondly, she mentioned that she laughed more today than yesterday. Her goal for tomorrow is to work on her support system in that she wishes to add more people to her list.   Westly Pam 08/18/2012, 1:07 AM

## 2012-08-18 NOTE — Progress Notes (Signed)
Recreation Therapy Notes  Date: 07.14.2014  Time: 3:00pm Location: [address] Dayroom     Group Topic/Focus: Self Expression, Coping Skills  Participation Level: Active  Participation Quality: Appropriate and Attentive  Affect: Euthymic  Cognitive: Appropriate   Additional Comments: Activity: Mini Wellness Toolbox; Explanation: Patient was given a small plain white box that functions like a matchbox. Patient was asked to represent themselves on the outside of the box and use words or pictures to represent the things they need to keep themselves well and place them on the inside of the box.   Patient actively participated in activity. Patient cut out words for the inside of her box. Patient shared she cut out positive words and words such as "healthy nutrition." Patient stated wellness is being well in body and mind.   Marykay Lex Marsh Heckler, LRT/CTRS  Jaleigha Deane L 08/18/2012 4:00 PM

## 2012-08-18 NOTE — Progress Notes (Signed)
Adult Psychoeducational Group Note  Date:  08/18/2012 Time:  1:59 PM  Group Topic/Focus:  Self Care:   The focus of this group is to help patients understand the importance of self-care in order to improve or restore emotional, physical, spiritual, interpersonal, and financial health.  Participation Level:  Active  Participation Quality:  Appropriate, Attentive and Sharing  Affect:  Appropriate  Cognitive:  Appropriate  Insight: Appropriate and Good  Engagement in Group:  Engaged  Modes of Intervention:  Activity, Discussion, Education and Socialization  Additional Comments: Jacquelina attended group and participated and shared. Patient defined self-care in own term. Patient completed the self care assessment on emotional, physical, spiritual, psychological, relationship care and rated each area. Patient shared the weaknesses and strengthens of each area and was asked to set a goal of the weaknesses of the assessment.    Karleen Hampshire Brittini 08/18/2012, 1:59 PM

## 2012-08-18 NOTE — Progress Notes (Signed)
Adult Psychoeducational Group Note  Date:  08/18/2012 Time:  8:00PM Group Topic/Focus:  Wrap-Up Group:   The focus of this group is to help patients review their daily goal of treatment and discuss progress on daily workbooks.  Participation Level:  Active  Participation Quality:  Appropriate  Affect:  Appropriate  Cognitive:  Alert and Appropriate  Insight: Appropriate  Engagement in Group:  Engaged  Modes of Intervention:  Discussion  Additional Comments:  Pt. Was attentive and appropriate during tonight's group discussion. Pt stated that today was a roller coaster due to crying and laughing all day. Pt stated that she learned a lot in today group that she plan to apply to her lifestyle. Pt stated that she was able to talk to staff and peers about issues that she hasn't talked about in years.   Bing Plume D 08/18/2012, 9:20 PM

## 2012-08-19 LAB — GLUCOSE, CAPILLARY

## 2012-08-19 LAB — TSH: TSH: 2.662 u[IU]/mL (ref 0.350–4.500)

## 2012-08-19 NOTE — Progress Notes (Signed)
D:  Patient in the dayroom on approach.  Patient states her day was ok.  Patient states she had a rough night because she was having issues with her roommate but states she is happy because her room was moved.  Patient states she wants to get a good nights rest tonight.  Patient appears bight and smiles when speaking to Clinical research associate.  Patient state she has passive SI.  Patient denies HI and denies AVH.  Patient verbally contracts for safety. A: Staff to monitor Q 15 mins for safety.  Encouragement and support offered.  Scheduled medications administered per orders. R: Patient remains safe on the unit.  Patient attended group tonight.  Patient cooperative and taking administered medications.  Patient visible on the unit and interacting with peers.

## 2012-08-19 NOTE — Progress Notes (Signed)
Adult Psychoeducational Group Note  Date:  08/19/2012 Time:  11:15am Group Topic/Focus:  Recovery Goals:   The focus of this group is to identify appropriate goals for recovery and establish a plan to achieve them.  Participation Level:  Active  Participation Quality:  Appropriate and Attentive  Affect:  Appropriate  Cognitive:  Alert and Appropriate  Insight: Appropriate  Engagement in Group:  Engaged  Modes of Intervention:  Discussion and Education  Additional Comments:  Pt attended and participated in group. Pt when asked stated recovery meant a vision of where you wanted to go. She stated her goals to help in her recovery process was to get the support I need,not to hold in thoughts and feelings,say no to people when she knows she can not help.  Shelly Bombard D 08/19/2012, 3:19 PM

## 2012-08-19 NOTE — Progress Notes (Signed)
Patient ID: [name], female   DOB: 01/14/1976, 37 y.o.   MRN: [id number]     [facility] MD Progress Note  08/19/2012 2:10 PM [name]  MRN:  [id number] Subjective:  Patient complains of feeling tired today reporting her roommate kept her up but feels better knowing that her room is going to be changed by staff. Continues to rate her depression high at a nine feeling that lack of sleep has made things worse for her. She is glad that seroquel was added to her medication regimen stating "I feel I need more than an antidepressant. I always have racing thoughts at night. I'm hoping I feel better tomorrow. Yesterday just ended up being a bad day all around."   Diagnosis:   Axis I: Anxiety Disorder NOS and Major Depression, Recurrent severe Axis II: Deferred Axis III:  Past Medical History  Diagnosis Date  . Seizures   . Narcolepsy   . Restless leg   . Depression   . Hyperlipidemia   . Allergic rhinitis   . Narcolepsy   . Diabetes mellitus without complication   . Migraine    Axis IV: other psychosocial or environmental problems Axis V: 41-50 serious symptoms  ADL's:  Intact  Sleep: poor  Appetite:  Fair  Suicidal Ideation:  Passive SI no plan Homicidal Ideation:  Denies AEB (as evidenced by):  Psychiatric Specialty Exam: Review of Systems  Constitutional: Negative.   HENT: Negative for hearing loss, ear pain, nosebleeds and tinnitus.   Eyes: Negative.   Respiratory: Negative.   Cardiovascular: Negative.   Gastrointestinal: Negative.   Genitourinary: Negative.   Musculoskeletal: Negative.   Skin: Negative.   Neurological: Positive for headaches.  Endo/Heme/Allergies: Negative.   Psychiatric/Behavioral: Positive for depression and suicidal ideas. Negative for hallucinations, memory loss and substance abuse. The patient is nervous/anxious and has insomnia.     Blood pressure 111/78, pulse 101, temperature 97.5 F (36.4 C), temperature source Oral, resp. rate 20,  height 5\' 6"  (1.676 m), weight 87.998 kg (194 lb).Body mass index is 31.33 kg/(m^2).  General Appearance: Casual and Fairly Groomed  Eye Contact::  Minimal  Speech:  Slow  Volume:  Decreased  Mood:  Depressed  Affect:  Flat  Thought Process:  Goal Directed  Orientation:  Full (Time, Place, and Person)  Thought Content:  WDL  Suicidal Thoughts:  Yes.  without intent/plan  Homicidal Thoughts:  No  Memory:  Immediate;   Good Recent;   Good Remote;   Good  Judgement:  Impaired  Insight:  Fair  Psychomotor Activity:  Normal  Concentration:  Fair  Recall:  Fair  Akathisia:  No  Handed:  Right  AIMS (if indicated):     Assets:  Communication Skills Desire for Improvement Intimacy Leisure Time Physical Health Resilience  Sleep:  Number of Hours: 5   Current Medications: Current Facility-Administered Medications  Medication Dose Route Frequency Provider Last Rate Last Dose  . acetaminophen (TYLENOL) tablet 650 mg  650 mg Oral Q6H PRN Kerry Hough, PA-C   650 mg at 08/16/12 0827  . alum & mag hydroxide-simeth (MAALOX/MYLANTA) 200-200-20 MG/5ML suspension 30 mL  30 mL Oral Q4H PRN Kerry Hough, PA-C      . clonazePAM Scarlette Calico) tablet 0.5 mg  0.5 mg Oral BID PRN Verne Spurr, PA-C   0.5 mg at 08/19/12 0807  . ibuprofen (ADVIL,MOTRIN) tablet 600 mg  600 mg Oral Q6H PRN Fransisca Kaufmann, NP   600 mg at 08/19/12 1104  . levETIRAcetam (  KEPPRA XR) 24 hr tablet 1,500 mg  1,500 mg Oral BID Kerry Hough, PA-C   1,500 mg at 08/19/12 1610  . loratadine (CLARITIN) tablet 10 mg  10 mg Oral Daily Kerry Hough, PA-C   10 mg at 08/19/12 9604  . magnesium hydroxide (MILK OF MAGNESIA) suspension 30 mL  30 mL Oral Daily PRN Kerry Hough, PA-C      . metFORMIN (GLUCOPHAGE) tablet 500 mg  500 mg Oral BID WC Kerry Hough, PA-C   500 mg at 08/19/12 0804  . pantoprazole (PROTONIX) EC tablet 20 mg  20 mg Oral Daily Kerry Hough, PA-C   20 mg at 08/19/12 0804  . QUEtiapine (SEROQUEL) tablet  25 mg  25 mg Oral QHS Verne Spurr, PA-C   25 mg at 08/18/12 2109  . rOPINIRole (REQUIP) tablet 3 mg  3 mg Oral QHS Kerry Hough, PA-C   3 mg at 08/18/12 2109  . sertraline (ZOLOFT) tablet 50 mg  50 mg Oral Daily Fransisca Kaufmann, NP   50 mg at 08/19/12 5409    Lab Results:  Results for orders placed during the hospital encounter of 08/15/12 (from the past 48 hour(s))  GLUCOSE, CAPILLARY     Status: Abnormal   Collection Time    08/17/12  5:00 PM      Result Value Range   Glucose-Capillary 194 (*) 70 - 99 mg/dL   Comment 1 Notify RN    GLUCOSE, CAPILLARY     Status: Abnormal   Collection Time    08/18/12  6:22 AM      Result Value Range   Glucose-Capillary 136 (*) 70 - 99 mg/dL   Comment 1 Notify RN    GLUCOSE, CAPILLARY     Status: Abnormal   Collection Time    08/18/12 11:53 AM      Result Value Range   Glucose-Capillary 229 (*) 70 - 99 mg/dL   Comment 1 Notify RN    GLUCOSE, CAPILLARY     Status: Abnormal   Collection Time    08/18/12  5:00 PM      Result Value Range   Glucose-Capillary 188 (*) 70 - 99 mg/dL  TSH     Status: None   Collection Time    08/18/12  7:54 PM      Result Value Range   TSH 2.662  0.350 - 4.500 uIU/mL  GLUCOSE, CAPILLARY     Status: Abnormal   Collection Time    08/19/12  6:35 AM      Result Value Range   Glucose-Capillary 162 (*) 70 - 99 mg/dL   Comment 1 Notify RN      Physical Findings: AIMS: Facial and Oral Movements Muscles of Facial Expression: None, normal Lips and Perioral Area: None, normal Jaw: None, normal Tongue: None, normal,Extremity Movements Upper (arms, wrists, hands, fingers): None, normal Lower (legs, knees, ankles, toes): None, normal, Trunk Movements Neck, shoulders, hips: None, normal, Overall Severity Severity of abnormal movements (highest score from questions above): None, normal Incapacitation due to abnormal movements: None, normal Patient's awareness of abnormal movements (rate only patient's report): No  Awareness, Dental Status Current problems with teeth and/or dentures?: No Does patient usually wear dentures?: No  CIWA:    COWS:     Treatment Plan Summary: Daily contact with patient to assess and evaluate symptoms and progress in treatment Medication management  Plan: Continue crisis management and stabilization.  Medication management: Continue Zoloft 50 mg for depressive  symptoms and Seroquel 25 mg at bedtime to help with insomnia and racing thoughts.  Encouraged patient to attend groups and participate in group counseling sessions and activities.  Discharge plan in progress.  Continue current treatment plan.   Medical Decision Making Problem Points:  Established problem, stable/improving (1) and Review of psycho-social stressors (1) Data Points:  Review of medication regiment & side effects (2)  I certify that inpatient services furnished can reasonably be expected to improve the patient's condition.   Fransisca Kaufmann NP-C 2:10 PM 08/19/2012

## 2012-08-19 NOTE — Progress Notes (Signed)
D:  Patient up and active in the milieu most of the day.  States she did not sleep well last night because her roommate Reports her depression at 17, hopelessness at 8, and anxiety at 7.  Has received clonazepam once this shift.  She reports her appetite as poor today.  She is having fleeting thoughts of self harm/suicide, but states she would not act on them and agrees to seek out staff if feeling unsafe.   A:  Medications given as prescribed.  Encouraged participation in groups.  Patient has been notified that her room will be changed as soon as we have a discharge.  R:  Cooperative on the unit.  Attending and participating in groups.  Interacting well with staff and peers.

## 2012-08-19 NOTE — Progress Notes (Signed)
Recreation Therapy Notes  Date: 07.15.2014 Time: 2:45pm Location: [address] Dayroom      Group Topic/Focus: Musician (AAA/T)  Session: Animal Assisted Activities (AAA)  Dog Team: Butters & handler  Affect: Euthymic  Cognitive: Appropraite  Participation Level: Active  Additional Comments: Patient interacted appropriately with peer, dog team, LRT and MHT.   Marykay Lex Misha Vanoverbeke, LRT/CTRS  Elvin Banker L 08/19/2012 5:01 PM

## 2012-08-19 NOTE — [facility] Group Notes (Signed)
[facility] LCSW Group Therapy      Feelings About Diagnosis 1:15 - 2:30 PM         08/19/2012  10:56 AM    Type of Therapy:  Group Therapy  Participation Level: Active  Participation Quality:  Appropriate  Affect:  Appropriate  Cognitive:  Alert and Appropriate  Insight:  Developing/Improving and Engaged  Engagement in Therapy:  Developing/Improving and Engaged  Modes of Intervention:  Discussion, Education, Exploration, Problem-Solving, Rapport Building, Support  Summary of Progress/Problems:  Patient actively participated in group. Patient discussed past and present diagnosis and the effects it has had on  life.  Patient talked about family and society being judgmental and the stigma associated with having a mental health diagnosis.  She shared she has  Learned she does not have to be ashamed of being depressed.  Wynn Banker 08/19/2012  10:56 AM

## 2012-08-19 NOTE — [facility] Group Notes (Signed)
[facility] LCSW Aftercare Discharge Planning Group Note   08/19/2012 10:52 AM  Participation Quality:  Appropriate  Mood/Affect:  Appropriate, Depressed, Flat and Tearful  Depression Rating:  8  Anxiety Rating:  9  Thoughts of Suicide:  No  Will you contract for safety?   Yes  Current AVH:  No  Plan for Discharge/Comments:   Patient attending discharge planning group and actively participated in group.  CSW provided all participants with daily workbook.  Patient advised of having a good visit with mother who is working with other family members for patient to have a good support system at discharge.  Discharge plans remain the same.  Transportation Means: Patient has transportation.   Supports:  Patient has a good support system.  Harjot Dibello, Joesph July

## 2012-08-20 DIAGNOSIS — F411 Generalized anxiety disorder: Secondary | ICD-10-CM

## 2012-08-20 LAB — GLUCOSE, CAPILLARY: Glucose-Capillary: 139 mg/dL — ABNORMAL HIGH (ref 70–99)

## 2012-08-20 NOTE — [facility] Group Notes (Signed)
[facility] LCSW Group Therapy  Emotional Regulation 1:15 - 2: 30 PM        08/20/2012  2:40 PM    Type of Therapy:  Group Therapy  Participation Level:  Appropriate  Participation Quality:  Appropriate  Affect:  Appropriate  Cognitive:  Attentive Appropriate  Insight:  Engaged  Engagement in Therapy:  Engaged  Modes of Intervention:  Discussion Exploration Problem-Solving Supportive  Summary of Progress/Problems:  Group topic was emotional regulations.  Patient participated in the discussion and was able to identify rage/anger as emotions she needs regulate.  Patient was able to identify approprite coping skills, walking away and talking with husband about how she feels.  Wynn Banker 08/20/2012 2:40 PM

## 2012-08-20 NOTE — Progress Notes (Signed)
Adult Psychoeducational Group Note  Date:  08/20/2012 Time: 11:00AM Group Topic/Focus:  Crisis Planning:   The purpose of this group is to help patients create a crisis plan for use upon discharge or in the future, as needed.  Participation Level:  Active  Participation Quality:  Appropriate  Affect:  Appropriate  Cognitive:  Appropriate  Insight: Improving  Engagement in Group:  Engaged  Modes of Intervention:  Discussion  Additional Comments:   Patient was able to identify two of her triggers and list some of her support persons.   Zacarias Pontes R 08/20/2012, 3:54 PM

## 2012-08-20 NOTE — Progress Notes (Signed)
Adult Psychoeducational Group Note  Date:  08/20/2012 Time:  8:00PM Group Topic/Focus:  Wrap-Up Group:   The focus of this group is to help patients review their daily goal of treatment and discuss progress on daily workbooks.  Participation Level:  Did Not Attend    Additional Comments: Pt. Didn't attend group.   Bing Plume D 08/20/2012, 9:39 PM

## 2012-08-20 NOTE — [facility] Suicide Risk Assessment (Signed)
[facility] INPATIENT:  Family/Significant Other Suicide Prevention Education  Suicide Prevention Education:  Education Completed; Hanley Seamen, Husband, [phone];  has been identified by the patient as the family member/significant other with whom the patient will be residing, and identified as the person(s) who will aid the patient in the event of a mental health crisis (suicidal ideations/suicide attempt).  With written consent from the patient, the family member/significant other has been provided the following suicide prevention education, prior to the and/or following the discharge of the patient.  The suicide prevention education provided includes the following:  Suicide risk factors  Suicide prevention and interventions  National Suicide Hotline telephone number  [facility] assessment telephone number  [facility] Emergency Assistance 911  [facility] and/or Residential Mobile Crisis Unit telephone number  Request made of family/significant other to:  Remove weapons (e.g., guns, rifles, knives), all items previously/currently identified as safety concern.  Husband advised there are in guns in the home.  Remove drugs/medications (over-the-counter, prescriptions, illicit drugs), all items previously/currently identified as a safety concern.  The family member/significant other verbalizes understanding of the suicide prevention education information provided.  The family member/significant other agrees to remove the items of safety concern listed above.  Wynn Banker 08/20/2012, 11:39 AM

## 2012-08-20 NOTE — Progress Notes (Signed)
Recreation Therapy Notes  Date: 07.16.2014 Time: 3:00pm Location: [address] Dayroom      Group Topic/Focus: Leisure Education  Participation Level: Active  Participation Quality: Appropriate  Affect: Euthymic  Cognitive: Appropriate   Additional Comments: Activity: Leisure Time Clock ; Explanation: Patients were asked to identify how much time during each day is used for Work, Leisure, Self -care, and Miscellaneous time.   Patient actively participated in activity. Patient shared that her time clock "sucks." Patient indicated she sleeps too much and it effects other aspects of her life. Patient expressed a desire to change the amount of time she spends sleeping. Patient listened intently as peers discussed ways they use leisure time or ways they can implement positive leisure into their lives.    Marykay Lex Sandrika Schwinn, LRT/CTRS  Jearl Klinefelter 08/20/2012 4:28 PM

## 2012-08-20 NOTE — [facility] Group Notes (Signed)
[facility] LCSW Aftercare Discharge Planning Group Note   08/20/2012 11:08 AM  Participation Quality:  Appropriate  Mood/Affect:  Appropriate, Depressed  Depression Rating: 7  Anxiety Rating:  7  Thoughts of Suicide:  No  Will you contract for safety?   Yes  Current AVH:  No  Plan for Discharge/Comments:   Patient attending discharge planning group and actively participated in group.  CSW provided all participants with daily workbook.  Patient advised of sleeping well last night and being a little better today.  Discharge plans remain the same.  Transportation Means: Patient has transportation.   Supports:  Patient has a good support system.  [name], Joesph July

## 2012-08-20 NOTE — Progress Notes (Signed)
Adult Psychoeducational Group Note  Date:  08/20/2012 Time:  12:14 AM  Group Topic/Focus:  Wrap-Up Group:   The focus of this group is to help patients review their daily goal of treatment and discuss progress on daily workbooks.  Participation Level:  Active  Participation Quality:  Appropriate and Attentive  Affect:  Appropriate  Cognitive:  Alert, Appropriate and Oriented  Insight: Appropriate  Engagement in Group:  Engaged  Modes of Intervention:  Support  Additional Comments:  Pt stated that she had been getting a lot of support from her family and that she had met her goal of expanding her support system.   Humberto Seals Monique 08/20/2012, 12:14 AM

## 2012-08-20 NOTE — Progress Notes (Signed)
D: Patient in the hallway on approach.  Patient states her day was ok today.  Patient states she went to recreational therapy today.  Patient she realized she had been sleeping excessively at home.  Patient denies HI and denies AVH. A: Staff to monitor Q 15 mins for safety.  Encouragement and support offered.  Scheduled medications administered per orders. R: Patient remains safe on the unit.  Patient did not attend group tonight.  Patient visible on the unit and interacting with peers.  Patient taking administered medications.

## 2012-08-20 NOTE — Progress Notes (Signed)
Patient ID: [name], female   DOB: 06/12/1975, 37 y.o.   MRN: [id number] Patient ID: [name], female   DOB: 11-06-1975, 37 y.o.   MRN: [id number]     [facility] MD Progress Note  08/20/2012 11:22 AM Yuliana Vandrunen  MRN:  [id number]  Subjective: Gerre reports that she's starting to feel better in a way. She attributed it to her medications may be trying to kick in and also the help from attending group counseling sessions. She expressed some mixed feelings about feeling better because that means discharge time is approaching and the sad part of it is missing some good people that she met here. Slept better last nigh with the aid of sleeping pill. Rated both depression/anxiety at #7. Plans on joining a support group after discharge and relying on good family support.  Diagnosis:   Axis I: Anxiety Disorder NOS and Major Depression, Recurrent severe Axis II: Deferred Axis III:  Past Medical History  Diagnosis Date  . Seizures   . Narcolepsy   . Restless leg   . Depression   . Hyperlipidemia   . Allergic rhinitis   . Narcolepsy   . Diabetes mellitus without complication   . Migraine    Axis IV: other psychosocial or environmental problems Axis V: 41-50 serious symptoms  ADL's:  Intact  Sleep: poor  Appetite:  Fair  Suicidal Ideation:  Denies any intent, plan. Homicidal Ideation:  Denies  AEB (as evidenced by):  Psychiatric Specialty Exam: Review of Systems  Constitutional: Negative.   HENT: Negative.  Negative for hearing loss, ear pain, nosebleeds and tinnitus.   Eyes: Negative.   Respiratory: Negative.   Cardiovascular: Negative.   Gastrointestinal: Negative.   Genitourinary: Negative.   Musculoskeletal: Negative.   Skin: Negative.   Endo/Heme/Allergies: Negative.   Psychiatric/Behavioral: Positive for depression and suicidal ideas. Negative for hallucinations, memory loss and substance abuse. The patient is nervous/anxious and has insomnia.     Blood pressure  117/87, pulse 98, temperature 98.4 F (36.9 C), temperature source Oral, resp. rate 16, height 5\' 6"  (1.676 m), weight 87.998 kg (194 lb).Body mass index is 31.33 kg/(m^2).  General Appearance: Casual and Fairly Groomed  Eye Contact::  Minimal  Speech:  Slow  Volume:  Decreased  Mood:  Depressed, tearful  Affect:  Flat  Thought Process:  Goal Directed  Orientation:  Full (Time, Place, and Person)  Thought Content:  WDL  Suicidal Thoughts:  Yes.  without intent/plan  Homicidal Thoughts:  No  Memory:  Immediate;   Good Recent;   Good Remote;   Good  Judgement:  Impaired  Insight:  Fair  Psychomotor Activity:  Normal  Concentration:  Fair  Recall:  Fair  Akathisia:  No  Handed:  Right  AIMS (if indicated):     Assets:  Communication Skills Desire for Improvement Intimacy Leisure Time Physical Health Resilience  Sleep:  Number of Hours: 6.5   Current Medications: Current Facility-Administered Medications  Medication Dose Route Frequency Provider Last Rate Last Dose  . acetaminophen (TYLENOL) tablet 650 mg  650 mg Oral Q6H PRN Kerry Hough, PA-C   650 mg at 08/16/12 0827  . alum & mag hydroxide-simeth (MAALOX/MYLANTA) 200-200-20 MG/5ML suspension 30 mL  30 mL Oral Q4H PRN Kerry Hough, PA-C      . clonazePAM Scarlette Calico) tablet 0.5 mg  0.5 mg Oral BID PRN Verne Spurr, PA-C   0.5 mg at 08/19/12 2120  . ibuprofen (ADVIL,MOTRIN) tablet 600 mg  600 mg Oral  Q6H PRN Fransisca Kaufmann, NP   600 mg at 08/19/12 1104  . levETIRAcetam (KEPPRA XR) 24 hr tablet 1,500 mg  1,500 mg Oral BID Kerry Hough, PA-C   1,500 mg at 08/20/12 0802  . loratadine (CLARITIN) tablet 10 mg  10 mg Oral Daily Kerry Hough, PA-C   10 mg at 08/20/12 1610  . magnesium hydroxide (MILK OF MAGNESIA) suspension 30 mL  30 mL Oral Daily PRN Kerry Hough, PA-C      . metFORMIN (GLUCOPHAGE) tablet 500 mg  500 mg Oral BID WC Kerry Hough, PA-C   500 mg at 08/20/12 0803  . pantoprazole (PROTONIX) EC tablet 20 mg   20 mg Oral Daily Kerry Hough, PA-C   20 mg at 08/20/12 9604  . QUEtiapine (SEROQUEL) tablet 25 mg  25 mg Oral QHS Verne Spurr, PA-C   25 mg at 08/19/12 2120  . rOPINIRole (REQUIP) tablet 3 mg  3 mg Oral QHS Kerry Hough, PA-C   3 mg at 08/19/12 2120  . sertraline (ZOLOFT) tablet 50 mg  50 mg Oral Daily Fransisca Kaufmann, NP   50 mg at 08/20/12 5409    Lab Results:  Results for orders placed during the hospital encounter of 08/15/12 (from the past 48 hour(s))  GLUCOSE, CAPILLARY     Status: Abnormal   Collection Time    08/18/12 11:53 AM      Result Value Range   Glucose-Capillary 229 (*) 70 - 99 mg/dL   Comment 1 Notify RN    GLUCOSE, CAPILLARY     Status: Abnormal   Collection Time    08/18/12  5:00 PM      Result Value Range   Glucose-Capillary 188 (*) 70 - 99 mg/dL  TSH     Status: None   Collection Time    08/18/12  7:54 PM      Result Value Range   TSH 2.662  0.350 - 4.500 uIU/mL  GLUCOSE, CAPILLARY     Status: Abnormal   Collection Time    08/19/12  6:35 AM      Result Value Range   Glucose-Capillary 162 (*) 70 - 99 mg/dL   Comment 1 Notify RN    GLUCOSE, CAPILLARY     Status: Abnormal   Collection Time    08/19/12  5:20 PM      Result Value Range   Glucose-Capillary 210 (*) 70 - 99 mg/dL   Comment 1 Notify RN    GLUCOSE, CAPILLARY     Status: Abnormal   Collection Time    08/20/12  6:18 AM      Result Value Range   Glucose-Capillary 139 (*) 70 - 99 mg/dL   Comment 1 Notify RN      Physical Findings: AIMS: Facial and Oral Movements Muscles of Facial Expression: None, normal Lips and Perioral Area: None, normal Jaw: None, normal Tongue: None, normal,Extremity Movements Upper (arms, wrists, hands, fingers): None, normal Lower (legs, knees, ankles, toes): None, normal, Trunk Movements Neck, shoulders, hips: None, normal, Overall Severity Severity of abnormal movements (highest score from questions above): None, normal Incapacitation due to abnormal  movements: None, normal Patient's awareness of abnormal movements (rate only patient's report): No Awareness, Dental Status Current problems with teeth and/or dentures?: No Does patient usually wear dentures?: No  CIWA:    COWS:     Treatment Plan Summary: Daily contact with patient to assess and evaluate symptoms and progress in treatment Medication management  Plan: Supportive approach/coping skills/Stabilization. Encouraged out of room, participation in group sessions and application of coping skills when distressed. Will continue to monitor response to/adverse effects of medications in use to assure effectiveness. Continue to monitor mood, behavior and interaction with staff and other patients. Discharge plan in progress. Continue current plan of care.  Medical Decision Making Problem Points:  Established problem, stable/improving (1) and Review of psycho-social stressors (1) Data Points:  Review of medication regiment & side effects (2)  I certify that inpatient services furnished can reasonably be expected to improve the patient's condition.   Sanjuana Kava, PMHN_-BC  11:22 AM 08/20/2012

## 2012-08-20 NOTE — Progress Notes (Signed)
D: Patient pleasant and cooperative with staff and peers. Patient's affect is appropriate to circumstance, but mood is depressed. She reported on the self inventory sheet that she slept well, appetite/ability to pay attention is improving, and energy level is low. Patient rated depression "8" and feelings of hopelessness "6". She's attending and participating in groups. Compliant with medication regimen.  A: Support and encouragement provided to patient. Scheduled medications administered per MD orders. Maintain Q15 minute checks for safety.  R: Patient receptive. Denies SI/HI/AVH. Patient remains safe.

## 2012-08-21 LAB — GLUCOSE, CAPILLARY
Glucose-Capillary: 138 mg/dL — ABNORMAL HIGH (ref 70–99)
Glucose-Capillary: 150 mg/dL — ABNORMAL HIGH (ref 70–99)

## 2012-08-21 MED ORDER — SUMATRIPTAN SUCCINATE 50 MG PO TABS
50.0000 mg | ORAL_TABLET | ORAL | Status: DC | PRN
  Administered 2012-08-21 (×2): 50 mg via ORAL
  Filled 2012-08-21 (×2): qty 1

## 2012-08-21 MED ORDER — HYDROXYZINE HCL 50 MG PO TABS
50.0000 mg | ORAL_TABLET | Freq: Once | ORAL | Status: AC
  Administered 2012-08-21: 50 mg via ORAL
  Filled 2012-08-21: qty 1

## 2012-08-21 NOTE — Progress Notes (Signed)
D: Patient's affect is appropriate to circumstance, but mood is anxious. Patient complained of headache 8/10; also c/o anxiety 6/10. She reported on the self inventory sheet that her sleep is poor, appetite/ability to pay attention is improving and energy level is low. Patient rated depression "6" and feelings of hopelessness "1". Interactive with peers in the milieu and compliant with current medication regimen.  A: Support and encouragement provided to patient. Administered scheduled medications per ordering MD. PRN Ibuprofen given for HA pain and Klonopin for anxiety. Monitor Q15 minute checks for safety.  R: Patient receptive. Denies SI/HI/AVH. Patient remains safe on the unit.

## 2012-08-21 NOTE — Progress Notes (Signed)
Pt attended Karaoke group and participated by singing 2 songs.

## 2012-08-21 NOTE — [facility] Group Notes (Addendum)
[facility] LCSW Group Therapy  Living a Balanced Life    1:15 - 2:30 PM  08/21/2012  3:38 PM   Type of Therapy:  Group Therapy  Participation Level:  Active  Participation Quality:  Attentive  Affect:  Appropriate  Cognitive:  Appropriate  Insight:  Developing/Improving and Engaged  Engagement in Therapy:  Developing/Improving Engaged  Modes of Intervention:  Discussion, Education, Exploration, Problem-Solving, Rapport Building, Support  Summary of Progress/Problems:  Patient shared her life is completely off balance due to spending most of her time sleeping.  Patient shared she sleeps or spends approximately 20 hours per day in bed.  She stated she knows she needs to spend more time with her younger child but unable to make herself get up.  She stated she is hoping to structure her days as it has been here because she has had not problems getting up and participating in programming.  Wynn Banker 08/21/2012  3:38 PM

## 2012-08-21 NOTE — Progress Notes (Signed)
Patient ID: [name], female   DOB: 1976/01/27, 37 y.o.   MRN: [id number]      [facility] MD Progress Note [name]  MRN:  [id number]  Subjective: Patient is up and  Active in unit millieu. States no new problems and is anticipating discharge in AM. Sleeping well and states appetite is good. Diagnosis:   Axis I: Anxiety Disorder NOS and Major Depression, Recurrent severe Axis II: Deferred Axis III:  Past Medical History  Diagnosis Date  . Seizures   . Narcolepsy   . Restless leg   . Depression   . Hyperlipidemia   . Allergic rhinitis   . Narcolepsy   . Diabetes mellitus without complication   . Migraine    Axis IV: other psychosocial or environmental problems Axis V: 41-50 serious symptoms  ADL's:  Intact  Sleep: poor  Appetite:  Fair  Suicidal Ideation:  Denies any intent, plan. Homicidal Ideation:  Denies  AEB (as evidenced by):  Psychiatric Specialty Exam: Review of Systems  Constitutional: Negative.   HENT: Negative.  Negative for hearing loss, ear pain, nosebleeds and tinnitus.   Eyes: Negative.   Respiratory: Negative.   Cardiovascular: Negative.   Gastrointestinal: Negative.   Genitourinary: Negative.   Musculoskeletal: Negative.   Skin: Negative.   Endo/Heme/Allergies: Negative.   Psychiatric/Behavioral: Positive for depression and suicidal ideas. Negative for hallucinations, memory loss and substance abuse. The patient is nervous/anxious and has insomnia.     Blood pressure 117/87, pulse 98, temperature 98.4 F (36.9 C), temperature source Oral, resp. rate 16, height 5\' 6"  (1.676 m), weight 87.998 kg (194 lb).Body mass index is 31.33 kg/(m^2).  General Appearance: Casual and Fairly Groomed  Eye Contact::  Minimal  Speech:  Slow  Volume:  Decreased  Mood:  Depressed, tearful  Affect:  Flat  Thought Process:  Goal Directed  Orientation:  Full (Time, Place, and Person)  Thought Content:  WDL  Suicidal Thoughts:  denies  Homicidal Thoughts:  No   Memory:  Immediate;   Good Recent;   Good Remote;   Good  Judgement:  Impaired  Insight:  Fair  Psychomotor Activity:  Normal  Concentration:  Fair  Recall:  Fair  Akathisia:  No  Handed:  Right  AIMS (if indicated):     Assets:  Communication Skills Desire for Improvement Intimacy Leisure Time Physical Health Resilience  Sleep:  Number of Hours: 6.5   Current Medications: Current Facility-Administered Medications  Medication Dose Route Frequency Provider Last Rate Last Dose  . acetaminophen (TYLENOL) tablet 650 mg  650 mg Oral Q6H PRN Kerry Hough, PA-C   650 mg at 08/16/12 0827  . alum & mag hydroxide-simeth (MAALOX/MYLANTA) 200-200-20 MG/5ML suspension 30 mL  30 mL Oral Q4H PRN Kerry Hough, PA-C      . clonazePAM Scarlette Calico) tablet 0.5 mg  0.5 mg Oral BID PRN Verne Spurr, PA-C   0.5 mg at 08/19/12 2120  . ibuprofen (ADVIL,MOTRIN) tablet 600 mg  600 mg Oral Q6H PRN Fransisca Kaufmann, NP   600 mg at 08/19/12 1104  . levETIRAcetam (KEPPRA XR) 24 hr tablet 1,500 mg  1,500 mg Oral BID Kerry Hough, PA-C   1,500 mg at 08/20/12 0802  . loratadine (CLARITIN) tablet 10 mg  10 mg Oral Daily Kerry Hough, PA-C   10 mg at 08/20/12 8657  . magnesium hydroxide (MILK OF MAGNESIA) suspension 30 mL  30 mL Oral Daily PRN Kerry Hough, PA-C      .  metFORMIN (GLUCOPHAGE) tablet 500 mg  500 mg Oral BID WC Kerry Hough, PA-C   500 mg at 08/20/12 4098  . pantoprazole (PROTONIX) EC tablet 20 mg  20 mg Oral Daily Kerry Hough, PA-C   20 mg at 08/20/12 1191  . QUEtiapine (SEROQUEL) tablet 25 mg  25 mg Oral QHS Verne Spurr, PA-C   25 mg at 08/19/12 2120  . rOPINIRole (REQUIP) tablet 3 mg  3 mg Oral QHS Kerry Hough, PA-C   3 mg at 08/19/12 2120  . sertraline (ZOLOFT) tablet 50 mg  50 mg Oral Daily Fransisca Kaufmann, NP   50 mg at 08/20/12 4782    Lab Results:  Results for orders placed during the hospital encounter of 08/15/12 (from the past 48 hour(s))  GLUCOSE, CAPILLARY     Status:  Abnormal   Collection Time    08/18/12 11:53 AM      Result Value Range   Glucose-Capillary 229 (*) 70 - 99 mg/dL   Comment 1 Notify RN    GLUCOSE, CAPILLARY     Status: Abnormal   Collection Time    08/18/12  5:00 PM      Result Value Range   Glucose-Capillary 188 (*) 70 - 99 mg/dL  TSH     Status: None   Collection Time    08/18/12  7:54 PM      Result Value Range   TSH 2.662  0.350 - 4.500 uIU/mL  GLUCOSE, CAPILLARY     Status: Abnormal   Collection Time    08/19/12  6:35 AM      Result Value Range   Glucose-Capillary 162 (*) 70 - 99 mg/dL   Comment 1 Notify RN    GLUCOSE, CAPILLARY     Status: Abnormal   Collection Time    08/19/12  5:20 PM      Result Value Range   Glucose-Capillary 210 (*) 70 - 99 mg/dL   Comment 1 Notify RN    GLUCOSE, CAPILLARY     Status: Abnormal   Collection Time    08/20/12  6:18 AM      Result Value Range   Glucose-Capillary 139 (*) 70 - 99 mg/dL   Comment 1 Notify RN      Physical Findings: AIMS: Facial and Oral Movements Muscles of Facial Expression: None, normal Lips and Perioral Area: None, normal Jaw: None, normal Tongue: None, normal,Extremity Movements Upper (arms, wrists, hands, fingers): None, normal Lower (legs, knees, ankles, toes): None, normal, Trunk Movements Neck, shoulders, hips: None, normal, Overall Severity Severity of abnormal movements (highest score from questions above): None, normal Incapacitation due to abnormal movements: None, normal Patient's awareness of abnormal movements (rate only patient's report): No Awareness, Dental Status Current problems with teeth and/or dentures?: No Does patient usually wear dentures?: No  CIWA:    COWS:     Treatment Plan Summary: Daily contact with patient to assess and evaluate symptoms and progress in treatment Medication management  Plan: Supportive approach/coping skills/Stabilization. Encouraged out of room, participation in group sessions and application of coping  skills when distressed. Will continue to monitor response to/adverse effects of medications in use to assure effectiveness. Continue to monitor mood, behavior and interaction with staff and other patients. Discharge plan in progress. Continue current plan of care.  Medical Decision Making Problem Points:  Established problem, stable/improving (1) and Review of psycho-social stressors (1) Data Points:  Review of medication regiment & side effects (2)  I certify that inpatient  services furnished can reasonably be expected to improve the patient's condition.  Rona Ravens. Harbert Fitterer [facility] 08/21/2012

## 2012-08-21 NOTE — [facility] Group Notes (Signed)
[facility] LCSW Aftercare Discharge Planning Group Note   08/21/2012 10:02 AM  Participation Quality:  Appropriate  Mood/Affect:  Appropriate and Depressed  Depression Rating:  7  Anxiety Rating:  7  Thoughts of Suicide:  No  Will you contract for safety?   NA  Current AVH:  No  Plan for Discharge/Comments:  Patient attending discharge planning group and actively participated in group.  She advised of being okay but tired.  Patient stated she is not ready to discharge home.  She stated she believes medications need further adjustments.  CSW provided all participants with daily workbook.   Transportation Means: Patient has transportation.   Supports: Patient has support system.   Renata Gambino, Joesph July

## 2012-08-21 NOTE — Progress Notes (Signed)
Patient ID: [name], female   DOB: [birth year], 37 y.o.   MRN: [id number] Pt did not attend the 10 or 11 am Psychoeducational group.

## 2012-08-21 NOTE — Progress Notes (Signed)
Patient ID: [name], female   DOB: 04/03/1975, 37 y.o.   MRN: [id number]  D: Pt denies SI/HI/AVH. Pt is pleasant and cooperative. Pt states she had a "rocky day", but it got better once her HA was addressed. Pt had bright affect and decreased depression from earlier.  A: Pt was offered support and encouragement. Pt was given scheduled medications. Pt was encourage to attend groups. Q 15 minute checks were done for safety.   R:Pt attended karaoke  and interacts well with peers and staff. Pt is taking medication. Pt has no complaints at this time.Pt receptive to treatment and safety maintained on unit.

## 2012-08-22 LAB — GLUCOSE, CAPILLARY: Glucose-Capillary: 143 mg/dL — ABNORMAL HIGH (ref 70–99)

## 2012-08-22 MED ORDER — QUETIAPINE FUMARATE 25 MG PO TABS: 25.0000 mg | ORAL_TABLET | Freq: Every day | ORAL | Status: DC

## 2012-08-22 MED ORDER — METFORMIN HCL 500 MG PO TABS: 500.0000 mg | ORAL_TABLET | Freq: Two times a day (BID) | ORAL | Status: DC

## 2012-08-22 MED ORDER — SERTRALINE HCL 50 MG PO TABS: 50.0000 mg | ORAL_TABLET | Freq: Every day | ORAL | Status: DC

## 2012-08-22 MED ORDER — LEVETIRACETAM ER 750 MG PO TB24: 1500.0000 mg | ORAL_TABLET | Freq: Two times a day (BID) | ORAL | Status: DC

## 2012-08-22 MED ORDER — LANSOPRAZOLE 15 MG PO CPDR: 15.0000 mg | DELAYED_RELEASE_CAPSULE | Freq: Every day | ORAL | Status: DC

## 2012-08-22 MED ORDER — CLONAZEPAM 0.5 MG PO TABS: 0.5000 mg | ORAL_TABLET | Freq: Two times a day (BID) | ORAL | Status: DC | PRN

## 2012-08-22 MED ORDER — ROPINIROLE HCL 3 MG PO TABS: 3.0000 mg | ORAL_TABLET | Freq: Every day | ORAL | Status: DC

## 2012-08-22 MED ORDER — LORATADINE 10 MG PO TABS: 10.0000 mg | ORAL_TABLET | Freq: Every day | ORAL | Status: DC

## 2012-08-22 NOTE — Discharge Summary (Signed)
Physician Discharge Summary Note  Patient:  [name] is an 37 y.o., female MRN:  [id number] DOB:  [birth year] Patient phone:  [phone] (home)  Patient address:   [address] [place] [address],   Date of Admission:  08/15/2012 Date of Discharge: 08/22/2012  Reason for Admission:  Depression with suicidal ideation  Discharge Diagnoses: Principal Problem:   Severe recurrent major depression  Review of Systems  Constitutional: Negative.  Negative for fever, chills, weight loss, malaise/fatigue and diaphoresis.  HENT: Negative for congestion and sore throat.   Eyes: Negative for blurred vision, double vision and photophobia.  Respiratory: Negative for cough, shortness of breath and wheezing.   Cardiovascular: Negative for chest pain, palpitations and PND.  Gastrointestinal: Negative for heartburn, nausea, vomiting, abdominal pain, diarrhea and constipation.  Musculoskeletal: Negative for myalgias, joint pain and falls.  Neurological: Negative for dizziness, tingling, tremors, sensory change, speech change, focal weakness, seizures, loss of consciousness, weakness and headaches.  Endo/Heme/Allergies: Negative for polydipsia. Does not bruise/bleed easily.  Psychiatric/Behavioral: Negative for depression, suicidal ideas, hallucinations, memory loss and substance abuse. The patient is not nervous/anxious and does not have insomnia.   Discharge Diagnoses:  AXIS I: Major Depression, Recurrent severe  AXIS II: Deferred  AXIS III:  Past Medical History   Diagnosis  Date   .  Seizures    .  Narcolepsy    .  Restless leg    .  Depression    .  Hyperlipidemia    .  Allergic rhinitis    .  Narcolepsy    .  Diabetes mellitus without complication    .  Migraine     AXIS IV: other psychosocial or environmental problems  AXIS V: 61-70 mild symptoms     Level of Care:  OP  Hospital Course:  [name] was admitted from the ED after her PCP referred there for  worsening depression. She had undergone medication changes and did not seem to be helping. She had arrived in the ED by ambulance called by her PCP.  Her chief complaint was worsening depression and suicidal ideation. Complicating her situation was "large amounts of stress." She was evaluated and given medical clearance.  She was accepted to [facility] for further treatment and stabilization.       Upon arrival at the unit [name] was evaluated and her symptoms were identified. She was oriented to the unit and encouraged to participate in unit programming. Medication management was initiated with a goal of reducing her symptoms and returning her to a baseline level of functioning.       She was evaluated each day by a clinical provider to ascertain her progress and response to treatment.  Improvement was noted by her reports of reduced symptoms, improved sleep and appetite, her affect and participation in unit programming.       [name] was motivated to return to improved health and was open to taking her medication.  She worked well with the Sports coach and a discharge plan was developed.        By the day of discharge [name] was in much improved condition than upon admission and reported significant reduction in her symptoms. She was no longer suicidal and was anxious to return home.  She was in full touch with reality and denied SI/HI and stated no AVH. She was normal in thought content and process. [name] agreed to the plan of follow up as noted below and was discharged home.  Consults:  None  Significant Diagnostic Studies:  None  Discharge Vitals:   Blood pressure 110/79, pulse 103, temperature 98.6 F (37 C), temperature source Oral, resp. rate 16, height 5\' 6"  (1.676 m), weight 87.998 kg (194 lb). Body mass index is 31.33 kg/(m^2). Lab Results:   Results for orders placed during the hospital encounter of 08/15/12 (from the past 72 hour(s))  GLUCOSE, CAPILLARY     Status: Abnormal   Collection Time     08/19/12  5:20 PM      Result Value Range   Glucose-Capillary 210 (*) 70 - 99 mg/dL   Comment 1 Notify RN    GLUCOSE, CAPILLARY     Status: Abnormal   Collection Time    08/20/12  6:18 AM      Result Value Range   Glucose-Capillary 139 (*) 70 - 99 mg/dL   Comment 1 Notify RN    GLUCOSE, CAPILLARY     Status: Abnormal   Collection Time    08/20/12  5:15 PM      Result Value Range   Glucose-Capillary 175 (*) 70 - 99 mg/dL  GLUCOSE, CAPILLARY     Status: Abnormal   Collection Time    08/21/12  6:26 AM      Result Value Range   Glucose-Capillary 138 (*) 70 - 99 mg/dL  GLUCOSE, CAPILLARY     Status: Abnormal   Collection Time    08/21/12  5:11 PM      Result Value Range   Glucose-Capillary 150 (*) 70 - 99 mg/dL   Comment 1 Notify RN     Comment 2 Documented in Chart    GLUCOSE, CAPILLARY     Status: Abnormal   Collection Time    08/22/12  6:03 AM      Result Value Range   Glucose-Capillary 143 (*) 70 - 99 mg/dL   Comment 1 Notify RN      Physical Findings: AIMS: Facial and Oral Movements Muscles of Facial Expression: None, normal Lips and Perioral Area: None, normal Jaw: None, normal Tongue: None, normal,Extremity Movements Upper (arms, wrists, hands, fingers): None, normal Lower (legs, knees, ankles, toes): None, normal, Trunk Movements Neck, shoulders, hips: None, normal, Overall Severity Severity of abnormal movements (highest score from questions above): None, normal Incapacitation due to abnormal movements: None, normal Patient's awareness of abnormal movements (rate only patient's report): No Awareness, Dental Status Current problems with teeth and/or dentures?: No Does patient usually wear dentures?: No  CIWA:    COWS:     Psychiatric Specialty Exam: See Psychiatric Specialty Exam and Suicide Risk Assessment completed by Attending Physician prior to discharge.  Discharge destination:  Home  Is patient on multiple antipsychotic therapies at discharge:  No    Has Patient had three or more failed trials of antipsychotic monotherapy by history:  No  Recommended Plan for Multiple Antipsychotic Therapies: Not applicable    Future Appointments Provider Department Dept Phone   08/27/2012 2:30 PM Sandford Craze, NP [facility] at  [facility] [phone]   11/14/2012 1:00 PM Adam Gus Rankin, DO [facility] NEUROLOGY Ginette Otto [phone]       Medication List    STOP taking these medications       diphenhydrAMINE 25 MG tablet  Commonly known as:  BENADRYL     NUVIGIL 250 MG tablet  Generic drug:  Armodafinil      TAKE these medications     Indication   clonazePAM 0.5 MG tablet  Commonly known as:  Scarlette Calico  Take 1 tablet (0.5 mg total) by mouth 2 (two) times daily as needed (anxiety).   Indication:  Panic Disorder     lansoprazole 15 MG capsule  Commonly known as:  PREVACID  Take 1 capsule (15 mg total) by mouth daily.   Indication:  Gastroesophageal Reflux Disease     Levetiracetam 750 MG Tb24  Commonly known as:  KEPPRA XR  Take 2 tablets (1,500 mg total) by mouth 2 (two) times daily.   Indication:  Partial Onset Seizures     loratadine 10 MG tablet  Commonly known as:  CLARITIN  Take 1 tablet (10 mg total) by mouth daily.   Indication:  Hayfever     metFORMIN 500 MG tablet  Commonly known as:  GLUCOPHAGE  Take 1 tablet (500 mg total) by mouth 2 (two) times daily with a meal.   Indication:  Type 2 Diabetes     QUEtiapine 25 MG tablet  Commonly known as:  SEROQUEL  Take 1 tablet (25 mg total) by mouth at bedtime.   Indication:  Trouble Sleeping, Manic Phase of Manic-Depression     rOPINIRole 3 MG tablet  Commonly known as:  REQUIP  Take 1 tablet (3 mg total) by mouth at bedtime.   Indication:  Restless Leg Syndrome     sertraline 50 MG tablet  Commonly known as:  ZOLOFT  Take 1 tablet (50 mg total) by mouth daily. For depression.   Indication:  Major Depressive Disorder         Follow-up  recommendations:   Activities: Resume activity as tolerated. Diet: Heart healthy low sodium diet Tests: Follow up testing will be determined by your out patient provider. Comments:    Total Discharge Time:  Greater than 30 minutes.  Signed: Rona Ravens. Rowland Ericsson RPAC 10:46 AM 08/22/2012

## 2012-08-22 NOTE — Progress Notes (Signed)
[facility] Adult Case Management Discharge Plan :  Will you be returning to the same living situation after discharge: Yes,  Patient is returning home with family. At discharge, do you have transportation home?: Yes, Patient has transportation.  Do you have the ability to pay for your medications:  No, patient will be assisted with indigent medications.  Release of information consent forms completed and in the chart;  Patient's signature needed at discharge.  Patient to Follow up at: Follow-up Information   Follow up with [facility] On 08/26/2012. (You are scheduled wth [facility] on Tuesday, August 26, 2012 at [facility])    Contact information:   [address] [place], [place]  [address]  [phone]      Patient denies SI/HI:   Patient no longer endorsing SI/HI or other thoughts of self harm.     Safety Planning and Suicide Prevention discussed:  .Reviewed with all patients during discharge planning group   Tanija Germani, Joesph July 08/22/2012, 12:25 PM

## 2012-08-22 NOTE — Progress Notes (Signed)
[facility] Group Notes:  (Nursing/MHT/Case Management/Adjunct)  Date:  08/22/2012  Time:  2:15 PM  Type of Therapy:  Therapeutic Activity  Participation Level:  Active  Participation Quality:  Appropriate, Attentive and Sharing  Affect:  Appropriate  Cognitive:  Appropriate  Insight:  Appropriate and Good  Engagement in Group:  Engaged  Modes of Intervention:  Activity, Discussion, Exploration and Socialization  Summary of Progress/Problems: Emely attended, participated, and shared during group. Patient sat in a chair facing the opposite side of another chair and was asked to draw a coping skill and explain the description to the person sitting on the backside of the chair. After giving the description to the person, patient had to listen to the other person and draw the coping skill they had. Patient was asked to share why it is important to communication clearly, what were the differences in the pictures. Patient used communication as a tool to giving the drawer a description of a coping skill that had been drawn   Karleen Hampshire Brittini 08/22/2012, 2:15 PM

## 2012-08-22 NOTE — [facility] Group Notes (Signed)
[facility] LCSW Group Therapy  Feelings Around Relapse   1:15 - 2:30 PM  08/22/2012  2:48 PM   Type of Therapy:  Group Therapy  Participation Level:  Active  Participation Quality:  Attentive  Affect:  Appropriate  Cognitive:  Appropriate  Insight:  Developing/Improving and Engaged  Engagement in Therapy:  Developing/Improving Engaged  Modes of Intervention:  Discussion, Education, Exploration, Problem-Solving, Rapport Building, Support  Summary of Progress/Problems:  Patient shared relapse for her would mean spending a lot of time in bed or in isolation.  She shared she will be developing a plan on how to structure her day.  Patient shared husband will be helping her with the plan. Wynn Banker 08/22/2012  2:48 PM

## 2012-08-22 NOTE — Tx Team (Signed)
Interdisciplinary Treatment Plan Update (Adult)  Date: 08/22/2012   Time Reviewed: 8:33 AM  Progress in Treatment:  Attending groups: No. Participating in groups:  No. Taking medication as prescribed: Yes  Tolerating medication: Yes  Family/Significant othe contact made:Yes, Contact made with husband Patient understands diagnosis: Yes. Discussing patient identified problems/goals with staff: Yes  Medical problems stabilized or resolved: Yes  Denies suicidal/homicidal ideation:Yes, patient no longer endorsing SI/HI or thoughts of self harm. Patient has not harmed self or Others: Yes   New problem(s) identified:   Discharge Plan or Barriers: [facility]  Additional comments: N/A   Reason for Continuation of Hospitalization:  Estimated length of stay: Discharge today  For review of initial/current patient goals, please see plan of care.   Attendees:  Patient:    Physician: Geoffery Lyons, MD   Other:  Fransisca Kaufmann, NP 08/22/2012 8:33 AM   Other:  Harold Barban, RN 08/22/2012 8:33 AM   Other:  Juline Patch, LCSW Clinical Social Worker   08/22/2012 8:33 AM   Other: Onnie Boer, RN - UR 08/22/2012 8:33 AM   Other:  Reyes Ivan, Theresia Majors, Clinical Social Worker  08/22/2012 8:33 AM   Other: Frankey Shown, Transition Team 08/22/2012 8:33 AM   Other:     Scribe for Treatment Team:  .Ceara Wrightson, LCSW  8:33 AM

## 2012-08-22 NOTE — [facility] Group Notes (Signed)
[facility] LCSW Aftercare Discharge Planning Group Note   08/22/2012 12:30 PM  Participation Quality:  Appropriate  Mood/Affect:  Appropriate  Depression Rating:  4  Anxiety Rating: 3  Thoughts of Suicide:  No  Will you contract for safety?   NA  Current AVH:  No  Plan for Discharge/Comments:  Patient attending discharge planning group and actively participated in group.  She advised of being better and ready to discharge home today  Discharge plans remain the same.  CSW provided all participants with daily workbook.   Transportation Means: Patient has transportation.   Supports: Patient has support system.   Saniyya Gau, Joesph July

## 2012-08-22 NOTE — Progress Notes (Signed)
Nursing Discharge Note. Patient with MD order for discharge . Discharge instructions and follow up plan reviewed at length with patient, copy of AVS provided, Rx given with additional 14 day free supply of medication. Patient states ''I feel good to go'' Patients mood presents as euthymic, affect bright upon discharge. No signs of acute decompensation, Denies SI/HI or A/V Hallucinations. Crisis services reviewed. Pt denies any acute concerns at this time, opportunity for questions provided. All belongings returned. Pt escorted from unit to lobby.

## 2012-08-22 NOTE — [facility] Suicide Risk Assessment (Signed)
Suicide Risk Assessment  Discharge Assessment     Demographic Factors:  Caucasian  Mental Status Per Nursing Assessment::   On Admission:     Current Mental Status by Physician: In full contact with reality. There are no suicidal ideas, plans or intent. Her mood is euthymic. Her affect is appropriate. She endorses that she is feeling better. She is willing and motivated to pursue outpatient follow up. Feels the medications are working well   Loss Factors: Decline in physical health  Historical Factors: NA  Risk Reduction Factors:   Sense of responsibility to family, Living with another person, especially a relative and Positive social support  Continued Clinical Symptoms:  Depression:   Insomnia  Cognitive Features That Contribute To Risk: None identified   Suicide Risk:  Minimal: No identifiable suicidal ideation.  Patients presenting with no risk factors but with morbid ruminations; may be classified as minimal risk based on the severity of the depressive symptoms  Discharge Diagnoses:   AXIS I:  Major Depression, Recurrent severe AXIS II:  Deferred AXIS III:   Past Medical History  Diagnosis Date  . Seizures   . Narcolepsy   . Restless leg   . Depression   . Hyperlipidemia   . Allergic rhinitis   . Narcolepsy   . Diabetes mellitus without complication   . Migraine    AXIS IV:  other psychosocial or environmental problems AXIS V:  61-70 mild symptoms  Plan Of Care/Follow-up recommendations:  Activity:  as tolerated Diet:  regular Follow up outpatient basis Is patient on multiple antipsychotic therapies at discharge:  No   Has Patient had three or more failed trials of antipsychotic monotherapy by history:  No  Recommended Plan for Multiple Antipsychotic Therapies: N/A   Shakil Dirk A 08/22/2012, 12:34 PM

## 2012-08-24 ENCOUNTER — Encounter (HOSPITAL_COMMUNITY): Payer: Self-pay | Admitting: Emergency Medicine

## 2012-08-24 ENCOUNTER — Emergency Department (HOSPITAL_COMMUNITY)
Admission: EM | Admit: 2012-08-24 | Discharge: 2012-08-24 | Disposition: A | Discharge status: Home / Self Care | Payer: Self-pay | Attending: Emergency Medicine | Admitting: Emergency Medicine

## 2012-08-24 DIAGNOSIS — R51 Headache: Secondary | ICD-10-CM | POA: Insufficient documentation

## 2012-08-24 DIAGNOSIS — R569 Unspecified convulsions: Secondary | ICD-10-CM

## 2012-08-24 DIAGNOSIS — F411 Generalized anxiety disorder: Secondary | ICD-10-CM | POA: Insufficient documentation

## 2012-08-24 DIAGNOSIS — E119 Type 2 diabetes mellitus without complications: Secondary | ICD-10-CM | POA: Insufficient documentation

## 2012-08-24 DIAGNOSIS — Z8669 Personal history of other diseases of the nervous system and sense organs: Secondary | ICD-10-CM | POA: Insufficient documentation

## 2012-08-24 DIAGNOSIS — F3289 Other specified depressive episodes: Secondary | ICD-10-CM | POA: Insufficient documentation

## 2012-08-24 DIAGNOSIS — F329 Major depressive disorder, single episode, unspecified: Secondary | ICD-10-CM | POA: Insufficient documentation

## 2012-08-24 DIAGNOSIS — Z862 Personal history of diseases of the blood and blood-forming organs and certain disorders involving the immune mechanism: Secondary | ICD-10-CM | POA: Insufficient documentation

## 2012-08-24 DIAGNOSIS — Z8709 Personal history of other diseases of the respiratory system: Secondary | ICD-10-CM | POA: Insufficient documentation

## 2012-08-24 DIAGNOSIS — Z8679 Personal history of other diseases of the circulatory system: Secondary | ICD-10-CM | POA: Insufficient documentation

## 2012-08-24 DIAGNOSIS — Z880 Allergy status to penicillin: Secondary | ICD-10-CM | POA: Insufficient documentation

## 2012-08-24 DIAGNOSIS — Z8639 Personal history of other endocrine, nutritional and metabolic disease: Secondary | ICD-10-CM | POA: Insufficient documentation

## 2012-08-24 DIAGNOSIS — G40909 Epilepsy, unspecified, not intractable, without status epilepticus: Secondary | ICD-10-CM | POA: Insufficient documentation

## 2012-08-24 LAB — CBC WITH DIFFERENTIAL/PLATELET
Basophils Relative: 0 % (ref 0–1)
Eosinophils Absolute: 0.1 10*3/uL (ref 0.0–0.7)
HCT: 33.1 % — ABNORMAL LOW (ref 36.0–46.0)
Hemoglobin: 10.9 g/dL — ABNORMAL LOW (ref 12.0–15.0)
Lymphs Abs: 1.4 10*3/uL (ref 0.7–4.0)
MCH: 25.2 pg — ABNORMAL LOW (ref 26.0–34.0)
MCHC: 32.9 g/dL (ref 30.0–36.0)
Monocytes Absolute: 0.4 10*3/uL (ref 0.1–1.0)
Monocytes Relative: 6 % (ref 3–12)
Neutro Abs: 4 10*3/uL (ref 1.7–7.7)
Neutrophils Relative %: 67 % (ref 43–77)
RBC: 4.32 MIL/uL (ref 3.87–5.11)

## 2012-08-24 LAB — COMPREHENSIVE METABOLIC PANEL
Alkaline Phosphatase: 79 U/L (ref 39–117)
BUN: 9 mg/dL (ref 6–23)
Chloride: 103 mEq/L (ref 96–112)
Creatinine, Ser: 0.57 mg/dL (ref 0.50–1.10)
GFR calc Af Amer: >90 mL/min (ref >90)
Glucose, Bld: 209 mg/dL — ABNORMAL HIGH (ref 70–99)
Potassium: 3.9 mEq/L (ref 3.5–5.1)
Total Bilirubin: 0.3 mg/dL (ref 0.3–1.2)

## 2012-08-24 LAB — URINALYSIS W MICROSCOPIC + REFLEX CULTURE
Ketones, ur: NEGATIVE mg/dL
Leukocytes, UA: NEGATIVE
Nitrite: NEGATIVE
Protein, ur: NEGATIVE mg/dL
Urobilinogen, UA: 0.2 mg/dL (ref 0.0–1.0)

## 2012-08-24 LAB — RAPID URINE DRUG SCREEN, HOSP PERFORMED
Amphetamines: NOT DETECTED
Barbiturates: NOT DETECTED
Benzodiazepines: NOT DETECTED
Tetrahydrocannabinol: NOT DETECTED

## 2012-08-24 MED ORDER — CLONAZEPAM 0.5 MG PO TABS: 0.5000 mg | ORAL_TABLET | Freq: Two times a day (BID) | ORAL | Status: DC | PRN

## 2012-08-24 MED ORDER — LORAZEPAM 2 MG/ML IJ SOLN
1.0000 mg | Freq: Once | INTRAMUSCULAR | Status: AC
  Administered 2012-08-24: 1 mg via INTRAVENOUS
  Filled 2012-08-24: qty 1

## 2012-08-24 MED ORDER — MAGIC MOUTHWASH
5.0000 mL | Freq: Once | ORAL | Status: AC
  Administered 2012-08-24: 5 mL via ORAL
  Filled 2012-08-24: qty 5

## 2012-08-24 MED ORDER — CLONAZEPAM 0.5 MG PO TABS
1.0000 mg | ORAL_TABLET | Freq: Once | ORAL | Status: AC
  Administered 2012-08-24: 1 mg via ORAL
  Filled 2012-08-24: qty 2

## 2012-08-24 MED ORDER — ACETAMINOPHEN 325 MG PO TABS
650.0000 mg | ORAL_TABLET | Freq: Once | ORAL | Status: AC
  Administered 2012-08-24: 650 mg via ORAL
  Filled 2012-08-24: qty 2

## 2012-08-24 MED ORDER — LIDOCAINE VISCOUS 2 % MT SOLN: 5.0000 mL | OROMUCOSAL | Status: DC | PRN

## 2012-08-24 NOTE — ED Notes (Signed)
Per husband while pt was at home writing pt stated she did not feel well, handwriting changed and pt began having seizure. 3 separate seizures noted by husband with "jerking". + tongue trauma and urinary incontinence noted. Pt now tearful, A & O. Pt has had multiple medication changes recently, pt discharged from [facility] 2 days ago. Klonopin stopped yesterday, Zoloft and Seroquel added. Pt states she is compliant with Keppra for seizures. Husband and daughter at bedside.

## 2012-08-24 NOTE — ED Notes (Signed)
[id number]<AV> Expected date:<BR> Expected time:<BR> Means of arrival:<BR> Comments:<BR> EMS seizure

## 2012-08-24 NOTE — ED Notes (Signed)
Per EMS [facility]) pt transported from home after witnessed seizure by husband, PMH of epilepsy. Per EMS pt was post ictal on arrival, IV est by EMS. No meds administered PTA

## 2012-08-24 NOTE — ED Notes (Signed)
No further seizure activity noted, pt A & O, pt c/o HA, medicated for same. Pt states she did does not feel "right"

## 2012-08-24 NOTE — ED Notes (Signed)
Pts husband states he is concerned pt has not followed up with rheumatology re pos ANA. Pt and husband also concerned about new (last 6 months) pt has developed light Randa patches to forehead and L cheek. Husband states he looked up on Web MD and feels this might be Addison's disease.

## 2012-08-24 NOTE — ED Provider Notes (Signed)
History    CSN: [id number] Arrival date & time 08/24/12  2054  First MD Initiated Contact with Patient 08/24/12 2109     Chief Complaint  Patient presents with  . Seizures   (Consider location/radiation/quality/duration/timing/severity/associated sxs/prior Treatment) HPI [name] is a 37 y.o. female who presents to ED with complaint of seizures. Per husband, pt was writing down a to do list when her writing started to get "sloppy" and she told her husband she was not feeling well. Pt after that proceeded to have a seizure, tonic clonic, lasting about 4 min. Pt was postictal after. Pt did not return back all the way to baseline when she had two more similar seizures lasting about a minute each. EMS was called. Pt has not had any more seizures since. Pt has no complaints at present other than feeling anxious and having pain in the tongue where she thinks she bit it. Pt did have urinary incontinence. PT does have hx of seizures, on keppra. Denies missing any doses. Pt admits to recent stress, was just at [facility] for depression, discharged 3 days ago. States at that time had change in her medications. States was taken off klonopin which she used to take daily. States last dose yesterday morning. States was started on seroquel and zoloft. States taking those for about 4 days now.   Past Medical History  Diagnosis Date  . Seizures   . Narcolepsy   . Restless leg   . Depression   . Hyperlipidemia   . Allergic rhinitis   . Narcolepsy   . Diabetes mellitus without complication   . Migraine    Past Surgical History  Procedure Laterality Date  . Partial hysterectomy  2006  . Appendectomy  2006  . Tubal ligation  2002  . Appendectomy  2006  . Abdominal hysterectomy     Family History  Problem Relation Age of Onset  . Heart attack Mother   . Diabetes Father   . Heart attack Father    History  Substance Use Topics  . Smoking status: Never Smoker   . Smokeless tobacco: Never Used  .  Alcohol Use: No   OB History   Grav Para Term Preterm Abortions TAB SAB Ect Mult Living                 Review of Systems  Constitutional: Negative for fever and chills.  Eyes: Negative for visual disturbance.  Respiratory: Negative.   Cardiovascular: Negative.   Neurological: Positive for seizures and headaches.  All other systems reviewed and are negative.    Allergies  Sulfa antibiotics; Eggs or egg-derived products; Metoclopramide hcl; and Penicillins  Home Medications   Current Outpatient Rx  Name  Route  Sig  Dispense  Refill  . Armodafinil (NUVIGIL) 250 MG tablet   Oral   Take 250 mg by mouth 2 (two) times daily. No longer taking was discontinued about week and a half ago         . clonazePAM (KLONOPIN) 1 MG tablet   Oral   Take 1 mg by mouth at bedtime. Takes 1/2 tab at bedtime  And other as needed         . ketorolac (TORADOL) 10 MG tablet   Oral   Take 10 mg by mouth every 6 (six) hours as needed for pain (headaches).         . lansoprazole (PREVACID) 15 MG capsule   Oral   Take 1 capsule (15 mg total) by  mouth daily.         . Levetiracetam (KEPPRA XR) 750 MG TB24   Oral   Take 2 tablets (1,500 mg total) by mouth 2 (two) times daily.   120 tablet   12   . loratadine (CLARITIN) 10 MG tablet   Oral   Take 1 tablet (10 mg total) by mouth daily.         . metFORMIN (GLUCOPHAGE) 500 MG tablet   Oral   Take 1 tablet (500 mg total) by mouth 2 (two) times daily with a meal.   60 tablet   2   . QUEtiapine (SEROQUEL) 25 MG tablet   Oral   Take 1 tablet (25 mg total) by mouth at bedtime.   30 tablet   0   . rOPINIRole (REQUIP) 3 MG tablet   Oral   Take 1 tablet (3 mg total) by mouth at bedtime.         . sertraline (ZOLOFT) 50 MG tablet   Oral   Take 1 tablet (50 mg total) by mouth daily. For depression.   30 tablet   0   . Vortioxetine HBr (BRINTELLIX) 10 MG TABS   Oral   Take 1 tablet by mouth daily. Stop taking about two-three  weeks ago          BP 131/97  Pulse 88  Temp(Src) 99.2 F (37.3 C) (Oral)  Resp 19  Ht 5\' 6"  (1.676 m)  Wt 195 lb (88.451 kg)  BMI 31.49 kg/m2  SpO2 96% Physical Exam  Vitals reviewed. Constitutional: She is oriented to person, place, and time. She appears well-developed and well-nourished.  Tearful, anxious appearing  HENT:  Head: Normocephalic and atraumatic.  Nose: Nose normal.  Mouth/Throat: Oropharynx is clear and moist.  Tongue with bilateral side swelling with no obvious laceration or bleeding  Eyes: Conjunctivae and EOM are normal. Pupils are equal, round, and reactive to light.  Neck: Neck supple.  Cardiovascular: Normal rate, regular rhythm and normal heart sounds.   Pulmonary/Chest: Effort normal and breath sounds normal. No respiratory distress. She has no wheezes. She has no rales.  Musculoskeletal: She exhibits no edema.  Neurological: She is alert and oriented to person, place, and time.  5/5 and equal upper and lower extremity strength bilaterally. Equal grip strength bilaterally. Normal finger to nose and heel to shin. No pronator drift.   Skin: Skin is warm and dry.  Psychiatric: She has a normal mood and affect. Her behavior is normal.    ED Course  Procedures (including critical care time) Results for orders placed during the hospital encounter of 08/24/12  CBC WITH DIFFERENTIAL      Result Value Range   WBC 6.0  4.0 - 10.5 K/uL   RBC 4.32  3.87 - 5.11 MIL/uL   Hemoglobin 10.9 (*) 12.0 - 15.0 g/dL   HCT 16.1 (*) 09.6 - 04.5 %   MCV 76.6 (*) 78.0 - 100.0 fL   MCH 25.2 (*) 26.0 - 34.0 pg   MCHC 32.9  30.0 - 36.0 g/dL   RDW 40.9  81.1 - 91.4 %   Platelets 204  150 - 400 K/uL   Neutrophils Relative % 67  43 - 77 %   Neutro Abs 4.0  1.7 - 7.7 K/uL   Lymphocytes Relative 24  12 - 46 %   Lymphs Abs 1.4  0.7 - 4.0 K/uL   Monocytes Relative 6  3 - 12 %   Monocytes Absolute 0.4  0.1 - 1.0 K/uL   Eosinophils Relative 2  0 - 5 %   Eosinophils Absolute  0.1  0.0 - 0.7 K/uL   Basophils Relative 0  0 - 1 %   Basophils Absolute 0.0  0.0 - 0.1 K/uL  COMPREHENSIVE METABOLIC PANEL      Result Value Range   Sodium 136  135 - 145 mEq/L   Potassium 3.9  3.5 - 5.1 mEq/L   Chloride 103  96 - 112 mEq/L   CO2 25  19 - 32 mEq/L   Glucose, Bld 209 (*) 70 - 99 mg/dL   BUN 9  6 - 23 mg/dL   Creatinine, Ser 4.09  0.50 - 1.10 mg/dL   Calcium 9.3  8.4 - 81.1 mg/dL   Total Protein 6.9  6.0 - 8.3 g/dL   Albumin 3.5  3.5 - 5.2 g/dL   AST 68 (*) 0 - 37 U/L   ALT 85 (*) 0 - 35 U/L   Alkaline Phosphatase 79  39 - 117 U/L   Total Bilirubin 0.3  0.3 - 1.2 mg/dL   GFR calc non Af Amer >90  >90 mL/min   GFR calc Af Amer >90  >90 mL/min  URINALYSIS W MICROSCOPIC + REFLEX CULTURE      Result Value Range   Color, Urine YELLOW  YELLOW   APPearance CLEAR  CLEAR   Specific Gravity, Urine 1.021  1.005 - 1.030   pH 5.0  5.0 - 8.0   Glucose, UA >1000 (*) NEGATIVE mg/dL   Hgb urine dipstick NEGATIVE  NEGATIVE   Bilirubin Urine NEGATIVE  NEGATIVE   Ketones, ur NEGATIVE  NEGATIVE mg/dL   Protein, ur NEGATIVE  NEGATIVE mg/dL   Urobilinogen, UA 0.2  0.0 - 1.0 mg/dL   Nitrite NEGATIVE  NEGATIVE   Leukocytes, UA NEGATIVE  NEGATIVE   WBC, UA 0-2  <3 WBC/hpf   RBC / HPF 0-2  <3 RBC/hpf   Bacteria, UA FEW (*) RARE   Squamous Epithelial / LPF MANY (*) RARE  URINE RAPID DRUG SCREEN (HOSP PERFORMED)      Result Value Range   Opiates NONE DETECTED  NONE DETECTED   Cocaine NONE DETECTED  NONE DETECTED   Benzodiazepines NONE DETECTED  NONE DETECTED   Amphetamines NONE DETECTED  NONE DETECTED   Tetrahydrocannabinol NONE DETECTED  NONE DETECTED   Barbiturates NONE DETECTED  NONE DETECTED     No results found.  1. Seizures     MDM  Pt with history of seizure disorder here after having 3 consecutive seizures. Pt is now back to baseline. Reports headache and pain to the tongue which was treated with tylenol and magic mouthwash. Pt is on keppra. Denies missing any  doses. Concerning that pt was taking off klonopin while at [facility]. Last does yesterday morning. Given ativan 1mg  IV in ED. Pt's labs and UDS negative. No signs of head trauma or infection. Do not think imaging needed at this time. PT in ED for 2.5 hrs with no signs of seizures.   I discussed pt with dr. Thad Ranger, neurologist. Who recommended restarting klonopin. She did not believe pt needed to be admitted at this time given she is back to baseline. Home with klonopin and follow up with PCP.   Filed Vitals:   08/24/12 2059 08/24/12 2108  BP: 131/97   Pulse: 88   Temp: 99.2 F (37.3 C)   TempSrc: Oral   Resp: 19   Height: 5\' 6"  (1.676 m)  Weight: 195 lb (88.451 kg)   SpO2: 97% 96%     Lottie Mussel, PA-C 08/24/12 2347

## 2012-08-25 ENCOUNTER — Telehealth: Payer: Self-pay | Admitting: Family

## 2012-08-25 NOTE — Telephone Encounter (Signed)
Patient scheduled appointment for 09/02/12

## 2012-08-25 NOTE — Telephone Encounter (Signed)
Pls call pt and arrange a 1 week ED follow up.

## 2012-08-27 ENCOUNTER — Ambulatory Visit: Payer: Medicaid Other | Admitting: Family

## 2012-08-27 NOTE — Progress Notes (Signed)
Agree with assessment and plan [name] A. [name], M.D. 

## 2012-08-27 NOTE — Progress Notes (Signed)
Patient Discharge Instructions:  After Visit Summary (AVS):   Faxed to:  08/27/12 Psychiatric Admission Assessment Note:   Faxed to:  08/27/12 Suicide Risk Assessment - Discharge Assessment:   Faxed to:  08/27/12 Faxed/Sent to the Next Level Care provider:  08/27/12 Faxed to [facility] @ [phone]  Jerelene Redden, 08/27/2012, 3:01 PM

## 2012-08-28 NOTE — ED Provider Notes (Signed)
Medical screening examination/treatment/procedure(s) were performed by non-physician practitioner and as supervising physician I was immediately available for consultation/collaboration.  Broedy Osbourne, MD 08/28/12 2325 

## 2012-09-02 ENCOUNTER — Ambulatory Visit (INDEPENDENT_AMBULATORY_CARE_PROVIDER_SITE_OTHER): Payer: No Typology Code available for payment source | Admitting: Family

## 2012-09-02 ENCOUNTER — Encounter: Payer: Self-pay | Admitting: Family

## 2012-09-02 VITALS — BP 120/80 | HR 75 | Temp 98.2°F | Resp 16 | Wt 192.1 lb

## 2012-09-02 DIAGNOSIS — R569 Unspecified convulsions: Secondary | ICD-10-CM

## 2012-09-02 DIAGNOSIS — R5381 Other malaise: Secondary | ICD-10-CM

## 2012-09-02 DIAGNOSIS — R5383 Other fatigue: Secondary | ICD-10-CM

## 2012-09-02 DIAGNOSIS — F32A Depression, unspecified: Secondary | ICD-10-CM

## 2012-09-02 DIAGNOSIS — F329 Major depressive disorder, single episode, unspecified: Secondary | ICD-10-CM

## 2012-09-02 DIAGNOSIS — D509 Iron deficiency anemia, unspecified: Secondary | ICD-10-CM

## 2012-09-02 DIAGNOSIS — L819 Disorder of pigmentation, unspecified: Secondary | ICD-10-CM

## 2012-09-02 NOTE — Patient Instructions (Addendum)
Please keep your upcoming appointments with neurology and psychiatry. Please complete your lab work prior one morning this week.  Follow up in 3 months.

## 2012-09-02 NOTE — Progress Notes (Signed)
Subjective:    Patient ID: [name], female    DOB: Aug 30, 1975, 37 y.o.   MRN: [id number]  HPI  [name] is a 37 yr old female who presents today for follow up of her seizures. She had a seizure on 08/24/12 and was evaluated in the ED. Records are reviewed. She will meet tomorrow with neurology.  She has had no further seizures since that ED visit. She continues Keppra.  Depression/anxiety- she will meet with [facility] on friday. Reports she is having a good day today. She does report that she has been struggling with her relationship with her husband. She finds this to be a great source of stress.  She reports concern re: some darkening of the skin on her forehead and is requesting testing for addison's disease.    Review of Systems    see HPI  Past Medical History  Diagnosis Date  . Seizures   . Narcolepsy   . Restless leg   . Depression   . Hyperlipidemia   . Allergic rhinitis   . Narcolepsy   . Diabetes mellitus without complication   . Migraine     History   Social History  . Marital Status: Married    Spouse Name: N/A    Number of Children: 2  . Years of Education: N/A   Occupational History  . Unemployed- full time student     Completed 12th grade   Social History Main Topics  . Smoking status: Never Smoker   . Smokeless tobacco: Never Used  . Alcohol Use: No  . Drug Use: No  . Sexually Active: Not on file   Other Topics Concern  . Not on file   Social History Narrative  . No narrative on file    Past Surgical History  Procedure Laterality Date  . Partial hysterectomy  2006  . Appendectomy  2006  . Tubal ligation  2002  . Appendectomy  2006  . Abdominal hysterectomy      Family History  Problem Relation Age of Onset  . Heart attack Mother   . Diabetes Father   . Heart attack Father     Allergies  Allergen Reactions  . Sulfa Antibiotics Other (See Comments)    Tongue swelling  . Eggs Or Egg-Derived Products Nausea And Vomiting   . Metoclopramide Hcl Other (See Comments)    Intensifies restless leg  . Penicillins Itching    Current Outpatient Prescriptions on File Prior to Visit  Medication Sig Dispense Refill  . Armodafinil (NUVIGIL) 250 MG tablet Take 250 mg by mouth 2 (two) times daily. No longer taking was discontinued about week and a half ago      . clonazePAM (KLONOPIN) 0.5 MG tablet Take 1 tablet (0.5 mg total) by mouth 2 (two) times daily as needed for anxiety.  20 tablet  0  . ketorolac (TORADOL) 10 MG tablet Take 10 mg by mouth every 6 (six) hours as needed for pain (headaches).      . lansoprazole (PREVACID) 15 MG capsule Take 1 capsule (15 mg total) by mouth daily.      . Levetiracetam (KEPPRA XR) 750 MG TB24 Take 2 tablets (1,500 mg total) by mouth 2 (two) times daily.  120 tablet  12  . lidocaine (XYLOCAINE) 2 % solution Take 5 mLs by mouth as needed for pain.  100 mL  0  . loratadine (CLARITIN) 10 MG tablet Take 1 tablet (10 mg total) by mouth daily.      Marland Kitchen  metFORMIN (GLUCOPHAGE) 500 MG tablet Take 1 tablet (500 mg total) by mouth 2 (two) times daily with a meal.  60 tablet  2  . QUEtiapine (SEROQUEL) 25 MG tablet Take 1 tablet (25 mg total) by mouth at bedtime.  30 tablet  0  . rOPINIRole (REQUIP) 3 MG tablet Take 1 tablet (3 mg total) by mouth at bedtime.      . sertraline (ZOLOFT) 50 MG tablet Take 1 tablet (50 mg total) by mouth daily. For depression.  30 tablet  0  . Vortioxetine HBr (BRINTELLIX) 10 MG TABS Take 1 tablet by mouth daily. Stop taking about two-three weeks ago      . [DISCONTINUED] desvenlafaxine (PRISTIQ) 100 MG 24 hr tablet Take 100 mg by mouth daily.        . [DISCONTINUED] gabapentin (NEURONTIN) 300 MG capsule Take 300 mg by mouth 4 (four) times daily.         No current facility-administered medications on file prior to visit.    BP 120/80  Pulse 75  Temp(Src) 98.2 F (36.8 C) (Oral)  Resp 16  Wt 192 lb 1.3 oz (87.127 kg)  BMI 31.02 kg/m2  SpO2 97%    Objective:    Physical Exam  Constitutional: She is oriented to person, place, and time. She appears well-developed and well-nourished. No distress.  Cardiovascular: Normal rate and regular rhythm.   No murmur heard. Pulmonary/Chest: Effort normal and breath sounds normal. No respiratory distress. She has no wheezes. She has no rales. She exhibits no tenderness.  Neurological: She is alert and oriented to person, place, and time.  Psychiatric: She has a normal mood and affect.  Briefly tearful during interview today  skin: slight darkening of skin on forehead is noted.        Assessment & Plan:

## 2012-09-03 ENCOUNTER — Encounter: Payer: Self-pay | Admitting: Neurology

## 2012-09-03 ENCOUNTER — Ambulatory Visit (INDEPENDENT_AMBULATORY_CARE_PROVIDER_SITE_OTHER): Payer: Self-pay | Admitting: Neurology

## 2012-09-03 ENCOUNTER — Other Ambulatory Visit: Payer: Self-pay | Admitting: Family

## 2012-09-03 VITALS — BP 118/74 | HR 68 | Temp 98.2°F | Ht 66.0 in | Wt 193.0 lb

## 2012-09-03 DIAGNOSIS — R200 Anesthesia of skin: Secondary | ICD-10-CM

## 2012-09-03 DIAGNOSIS — R209 Unspecified disturbances of skin sensation: Secondary | ICD-10-CM

## 2012-09-03 DIAGNOSIS — G40309 Generalized idiopathic epilepsy and epileptic syndromes, not intractable, without status epilepticus: Secondary | ICD-10-CM

## 2012-09-03 DIAGNOSIS — L819 Disorder of pigmentation, unspecified: Secondary | ICD-10-CM | POA: Insufficient documentation

## 2012-09-03 DIAGNOSIS — G40909 Epilepsy, unspecified, not intractable, without status epilepticus: Secondary | ICD-10-CM

## 2012-09-03 DIAGNOSIS — F332 Major depressive disorder, recurrent severe without psychotic features: Secondary | ICD-10-CM

## 2012-09-03 MED ORDER — LAMOTRIGINE 25 MG PO TABS: ORAL_TABLET | ORAL | Status: DC

## 2012-09-03 NOTE — Assessment & Plan Note (Signed)
Uncontrolled.  Defer management to Neurology. Pt advised to keep her upcoming appointment tomorrow.

## 2012-09-03 NOTE — Progress Notes (Signed)
NEUROLOGY TRANSFER OF CARE NOTE  [name] MRN: [id number] DOB: 1975/08/25  Primary care provider: Ms. Peggyann Juba  Reason for visit:  Idiopathic primary generalized seizure disorder  HISTORY OF PRESENT ILLNESS: [name] is a 37 year old female with depression, anxiety, migraine and restless leg syndrome who presents with idiopathic generalized seizure disorder.  She was previously followed by [facility], Dr. Denton Meek and Dr. Murriel Hopper.    She began having seizures in 2009.  There was no precipitating factor.  Her seizures present as generalized tonic-clonic.  Reportedly, she has had several seizures while asleep, as she sometimes wakes up having bit her tongue but no blood on the pillow.  No witnesses to seizure activity at night.  She usually has migraine the next day.  No prior history of seizure that she is aware  She currently takes Keppra XR (750mg ) 1500mg  BID.  She has been on Keppra since 2009.  Previous medications include phenobarbital, but she cannot recall if she had been on anything else.  Records mention carbamazepine.  EEG from 11/08/10 revealed generalized polyspike and wave discharges.  MRI of brain w/wo contrast (09/10/08) reviewed and was unremarkable.  On 08/24/12, she presented to the ED after having a seizure.  She did not feel right that day.  She then had a witnessed tonic-clonic seizure lasting about 4 minutes, followed by postictal confusion.  She then had two other seizures, each 1 minute duration.  She was given 1mg  IV Ativan.  It was suspected to have been triggered by missing klonopin dose, but patient says she doesn't take it all the time anyway (just as needed).  It was decided to restart klonopin, with no change to her Keppra.  This was her last seizure.  She had approximately 5 GTC seizures prompting ED visits over the past 7 months, with more seizures possibly during sleep, as she wakes up with bruised tongue.  She had previously  been admitted to [facility] for major depression and suicidal ideation and was taken off her klonopin.  Urine drug screen was negative, including for benzodiazepine.  She has been feeling fatigued and was diagnosed with anemia.  She is currently being evaluated for Addison's disease and iron deficiency.  Of note, she has a known diagnosis of right ulnar neuropathy.  EMG was reportedly performed at [facility] in 2012.  It revealed an ulnar neuropathy but she says that compression was distal to the elbow.  She notes increased numbness in 5th digit when leaning on elbow.  She tried wrist splints which were ineffective.  She continues to have swelling and numbness in the ulnar distribution of her hand.  She is not driving at this time.  PAST MEDICAL HISTORY: Past Medical History  Diagnosis Date  . Seizures   . Narcolepsy   . Restless leg   . Depression   . Hyperlipidemia   . Allergic rhinitis   . Narcolepsy   . Diabetes mellitus without complication   . Migraine     PAST SURGICAL HISTORY: Past Surgical History  Procedure Laterality Date  . Partial hysterectomy  2006  . Appendectomy  2006  . Tubal ligation  2002  . Appendectomy  2006  . Abdominal hysterectomy      MEDICATIONS: Current Outpatient Prescriptions on File Prior to Visit  Medication Sig Dispense Refill  . clonazePAM (KLONOPIN) 0.5 MG tablet Take 1 tablet (0.5 mg total) by mouth 2 (two) times daily as needed for anxiety.  20 tablet  0  . ketorolac (TORADOL) 10 MG tablet Take 10 mg by mouth every 6 (six) hours as needed for pain (headaches).      . lansoprazole (PREVACID) 15 MG capsule Take 1 capsule (15 mg total) by mouth daily.      . Levetiracetam (KEPPRA XR) 750 MG TB24 Take 2 tablets (1,500 mg total) by mouth 2 (two) times daily.  120 tablet  12  . lidocaine (XYLOCAINE) 2 % solution Take 5 mLs by mouth as needed for pain.  100 mL  0  . loratadine (CLARITIN) 10 MG tablet Take 1 tablet (10 mg total) by mouth  daily.      . metFORMIN (GLUCOPHAGE) 500 MG tablet Take 1 tablet (500 mg total) by mouth 2 (two) times daily with a meal.  60 tablet  2  . QUEtiapine (SEROQUEL) 25 MG tablet Take 1 tablet (25 mg total) by mouth at bedtime.  30 tablet  0  . rOPINIRole (REQUIP) 3 MG tablet Take 1 tablet (3 mg total) by mouth at bedtime.      . sertraline (ZOLOFT) 50 MG tablet Take 1 tablet (50 mg total) by mouth daily. For depression.  30 tablet  0  . Vortioxetine HBr (BRINTELLIX) 10 MG TABS Take 1 tablet by mouth daily. Stop taking about two-three weeks ago      . Armodafinil (NUVIGIL) 250 MG tablet Take 250 mg by mouth 2 (two) times daily. No longer taking was discontinued about week and a half ago      . [DISCONTINUED] desvenlafaxine (PRISTIQ) 100 MG 24 hr tablet Take 100 mg by mouth daily.        . [DISCONTINUED] gabapentin (NEURONTIN) 300 MG capsule Take 300 mg by mouth 4 (four) times daily.         No current facility-administered medications on file prior to visit.    ALLERGIES: Allergies  Allergen Reactions  . Sulfa Antibiotics Other (See Comments)    Tongue swelling  . Eggs Or Egg-Derived Products Nausea And Vomiting  . Metoclopramide Hcl Other (See Comments)    Intensifies restless leg  . Penicillins Itching    FAMILY HISTORY: Family History  Problem Relation Age of Onset  . Heart attack Mother   . Diabetes Father   . Heart attack Father     SOCIAL HISTORY: History   Social History  . Marital Status: Married    Spouse Name: N/A    Number of Children: 2  . Years of Education: N/A   Occupational History  . Unemployed- full time student     Completed 12th grade   Social History Main Topics  . Smoking status: Never Smoker   . Smokeless tobacco: Never Used  . Alcohol Use: No  . Drug Use: No  . Sexually Active: Not on file   Other Topics Concern  . Not on file   Social History Narrative  . No narrative on file    REVIEW OF SYSTEMS: Constitutional: No fevers, chills, or  sweats, no generalized fatigue, change in appetite Eyes: No visual changes, double vision, eye pain Ear, nose and throat: No hearing loss, ear pain, nasal congestion, sore throat Cardiovascular: No chest pain, palpitations Respiratory:  No shortness of breath at rest or with exertion, wheezes GastrointestinaI: No nausea, vomiting, diarrhea, abdominal pain, fecal incontinence Genitourinary:  No dysuria, urinary retention or frequency Musculoskeletal:  No neck pain, back pain Integumentary: No rash, pruritus, skin lesions Neurological: as above Psychiatric: No depression, insomnia, anxiety Endocrine: No palpitations, fatigue,  diaphoresis, mood swings, change in appetite, change in weight, increased thirst Hematologic/Lymphatic:  No anemia, purpura, petechiae. Allergic/Immunologic: no itchy/runny eyes, nasal congestion, recent allergic reactions, rashes  PHYSICAL EXAM: Filed Vitals:   09/03/12 1010  BP: 118/74  Pulse: 68  Temp: 98.2 F (36.8 C)   General: No acute distress Head:  Normocephalic/atraumatic Neck: supple, no paraspinal tenderness, full range of motion Back: No paraspinal tenderness Heart: regular rate and rhythm Lungs: Clear to auscultation bilaterally. Vascular: No carotid bruits. Neurological Exam: Mental status: alert and oriented to person, place, time and self, speech fluent and not dysarthric, language intact. Cranial nerves: CN I: not tested CN II: pupils equal, round and reactive to light, visual fields intact, fundi unremarkable. CN III, IV, VI:  full range of motion, no nystagmus, no ptosis CN V: facial sensation intact CN VII: upper and lower face symmetric CN VIII: hearing intact CN IX, X: gag intact, uvula midline CN XI: sternocleidomastoid and trapezius muscles intact CN XII: tongue midline Bulk & Tone: normal, no fasciculations. Motor: 5/5 throughout Sensation: temperature and vibration intact Deep Tendon Reflexes: 2+ throughout, toes down Finger  to nose testing: normal Gait: normal. Able to walk in tandem.  Romberg negative.  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  She says that her seizures have been most controlled on Keppra, but she still gets them fairly frequent.  Also, I feel Keppra isn't the best choice for her as it can worsen mood.  My suggestion is to start either topiramate or lamotrigine and eventually taper off Keppra.  Topiramate can also address her migraines and lamotrigine is a mood stabilizer.  At this point, she would like to focus on treating her depression, so we will start lamotrigine. 1.  Start lamotrigine 25mg  daily and titrate over 5 weeks to 50mg  BID.  We will check a level at that time and make further adjustments.  Be alert for any new rash and contact us if you develop one. 2.  Continue Keppra XR 1500mg  BID for now. 3.  NCS-EMG of right upper extremity to evaluate hand numbness 4.  No driving for 6 months from prior seizure as per [facility]. 5.  Follow up in 2 months.  Thank you for allowing me to take part in the care of this patient.  Shon Millet, DO  CC: Sandford Craze, FNP

## 2012-09-03 NOTE — Assessment & Plan Note (Signed)
Improved. Continue current meds- keep upcoming appointment with [facility].

## 2012-09-03 NOTE — Assessment & Plan Note (Signed)
Will have pt return for AM cortisol.

## 2012-09-03 NOTE — Patient Instructions (Addendum)
Plan is to start lamotrigine (which can also help with mood) and eventually stop keppra (which is bad for mood).  1.  Lamotrigine 25mg  tablets  Take 1 tab daily x14 days  Then 1 tab twice daily x14 days  Then 2tabs twice dail x 7 days   At the beginning of week 5, call the clinic, so we can call in a prescription to further increase the dose to an appropriate level.  If you experience any new or unusual rash while taking this medication, call the clinic immediately.   2.  EMG of right arm   3.  Follow up in 2 months.  4.  No driving.

## 2012-09-03 NOTE — Telephone Encounter (Signed)
Rx request to pharmacy/[facility]  

## 2012-09-04 LAB — CORTISOL: Cortisol, Plasma: 17.5 ug/dL

## 2012-09-05 ENCOUNTER — Telehealth: Payer: Self-pay | Admitting: Family

## 2012-09-05 MED ORDER — IRON 325 (65 FE) MG PO TABS: ORAL_TABLET | ORAL | Status: DC

## 2012-09-05 NOTE — Telephone Encounter (Signed)
Please let pt know that iron level is low.  She should add iron supplement 325 mg twice daily.  Cortisol level is normal- addison's disease is unlikely.

## 2012-09-06 ENCOUNTER — Emergency Department (HOSPITAL_COMMUNITY)
Admission: EM | Admit: 2012-09-06 | Discharge: 2012-09-07 | Disposition: A | Discharge status: Home / Self Care | Payer: No Typology Code available for payment source | Attending: Emergency Medicine | Admitting: Emergency Medicine

## 2012-09-06 ENCOUNTER — Encounter (HOSPITAL_COMMUNITY): Payer: Self-pay | Admitting: Family Medicine

## 2012-09-06 DIAGNOSIS — G40909 Epilepsy, unspecified, not intractable, without status epilepticus: Secondary | ICD-10-CM | POA: Insufficient documentation

## 2012-09-06 DIAGNOSIS — Z3202 Encounter for pregnancy test, result negative: Secondary | ICD-10-CM | POA: Insufficient documentation

## 2012-09-06 DIAGNOSIS — M549 Dorsalgia, unspecified: Secondary | ICD-10-CM | POA: Insufficient documentation

## 2012-09-06 DIAGNOSIS — F411 Generalized anxiety disorder: Secondary | ICD-10-CM | POA: Insufficient documentation

## 2012-09-06 DIAGNOSIS — Z8639 Personal history of other endocrine, nutritional and metabolic disease: Secondary | ICD-10-CM | POA: Insufficient documentation

## 2012-09-06 DIAGNOSIS — Z8679 Personal history of other diseases of the circulatory system: Secondary | ICD-10-CM | POA: Insufficient documentation

## 2012-09-06 DIAGNOSIS — Z79899 Other long term (current) drug therapy: Secondary | ICD-10-CM | POA: Insufficient documentation

## 2012-09-06 DIAGNOSIS — E119 Type 2 diabetes mellitus without complications: Secondary | ICD-10-CM | POA: Insufficient documentation

## 2012-09-06 DIAGNOSIS — Z8709 Personal history of other diseases of the respiratory system: Secondary | ICD-10-CM | POA: Insufficient documentation

## 2012-09-06 DIAGNOSIS — Z880 Allergy status to penicillin: Secondary | ICD-10-CM | POA: Insufficient documentation

## 2012-09-06 DIAGNOSIS — Z862 Personal history of diseases of the blood and blood-forming organs and certain disorders involving the immune mechanism: Secondary | ICD-10-CM | POA: Insufficient documentation

## 2012-09-06 DIAGNOSIS — F3289 Other specified depressive episodes: Secondary | ICD-10-CM | POA: Insufficient documentation

## 2012-09-06 DIAGNOSIS — G8929 Other chronic pain: Secondary | ICD-10-CM | POA: Insufficient documentation

## 2012-09-06 DIAGNOSIS — F419 Anxiety disorder, unspecified: Secondary | ICD-10-CM

## 2012-09-06 DIAGNOSIS — F329 Major depressive disorder, single episode, unspecified: Secondary | ICD-10-CM

## 2012-09-06 DIAGNOSIS — R45851 Suicidal ideations: Secondary | ICD-10-CM | POA: Insufficient documentation

## 2012-09-06 NOTE — ED Notes (Signed)
Patient states that she has had pain all over her body since Thursday evening. States that she called the mobile crisis unit and they told her to come to ED for evaluation because "the pain could be making her anxiety worse."  States that she has had thoughts of harming herself.

## 2012-09-07 DIAGNOSIS — F339 Major depressive disorder, recurrent, unspecified: Secondary | ICD-10-CM

## 2012-09-07 LAB — CBC
MCHC: 32.8 g/dL (ref 30.0–36.0)
RDW: 13.4 % (ref 11.5–15.5)
WBC: 5.3 10*3/uL (ref 4.0–10.5)

## 2012-09-07 LAB — RAPID URINE DRUG SCREEN, HOSP PERFORMED
Amphetamines: NOT DETECTED
Barbiturates: NOT DETECTED
Benzodiazepines: NOT DETECTED
Cocaine: NOT DETECTED
Tetrahydrocannabinol: NOT DETECTED

## 2012-09-07 LAB — COMPREHENSIVE METABOLIC PANEL
ALT: 66 U/L — ABNORMAL HIGH (ref 0–35)
AST: 51 U/L — ABNORMAL HIGH (ref 0–37)
Albumin: 3.8 g/dL (ref 3.5–5.2)
Alkaline Phosphatase: 89 U/L (ref 39–117)
Potassium: 3.4 mEq/L — ABNORMAL LOW (ref 3.5–5.1)
Sodium: 138 mEq/L (ref 135–145)
Total Protein: 7.4 g/dL (ref 6.0–8.3)

## 2012-09-07 LAB — ACETAMINOPHEN LEVEL: Acetaminophen (Tylenol), Serum: <15 ug/mL (ref 10–30)

## 2012-09-07 MED ORDER — LAMOTRIGINE 25 MG PO TABS: 25.0000 mg | ORAL_TABLET | ORAL | Status: DC

## 2012-09-07 MED ORDER — LORATADINE 10 MG PO TABS
10.0000 mg | ORAL_TABLET | Freq: Every day | ORAL | Status: DC | PRN
  Filled 2012-09-07: qty 1

## 2012-09-07 MED ORDER — LAMOTRIGINE 25 MG PO TABS
25.0000 mg | ORAL_TABLET | Freq: Every day | ORAL | Status: DC
Start: 2012-09-07 — End: 2012-09-07
  Administered 2012-09-07: 25 mg via ORAL
  Filled 2012-09-07: qty 1

## 2012-09-07 MED ORDER — PANTOPRAZOLE SODIUM 20 MG PO TBEC
20.0000 mg | DELAYED_RELEASE_TABLET | Freq: Every day | ORAL | Status: DC
  Administered 2012-09-07: 20 mg via ORAL
  Filled 2012-09-07: qty 1

## 2012-09-07 MED ORDER — TRAMADOL HCL 50 MG PO TABS
50.0000 mg | ORAL_TABLET | Freq: Once | ORAL | Status: AC
  Administered 2012-09-07: 50 mg via ORAL
  Filled 2012-09-07: qty 1

## 2012-09-07 MED ORDER — LAMOTRIGINE 25 MG PO TABS: 25.0000 mg | ORAL_TABLET | Freq: Two times a day (BID) | ORAL | Status: DC

## 2012-09-07 MED ORDER — METFORMIN HCL 500 MG PO TABS
500.0000 mg | ORAL_TABLET | Freq: Two times a day (BID) | ORAL | Status: DC
  Administered 2012-09-07: 500 mg via ORAL
  Filled 2012-09-07 (×3): qty 1

## 2012-09-07 MED ORDER — ROPINIROLE HCL 1 MG PO TABS
3.0000 mg | ORAL_TABLET | Freq: Every day | ORAL | Status: DC
  Filled 2012-09-07: qty 3

## 2012-09-07 MED ORDER — CLONAZEPAM 0.5 MG PO TABS: 0.5000 mg | ORAL_TABLET | Freq: Two times a day (BID) | ORAL | Status: DC | PRN

## 2012-09-07 MED ORDER — LEVETIRACETAM ER 750 MG PO TB24
1500.0000 mg | ORAL_TABLET | Freq: Two times a day (BID) | ORAL | Status: DC
  Administered 2012-09-07: 1500 mg via ORAL
  Filled 2012-09-07 (×2): qty 2

## 2012-09-07 MED ORDER — LAMOTRIGINE 25 MG PO TABS: 50.0000 mg | ORAL_TABLET | Freq: Two times a day (BID) | ORAL | Status: DC

## 2012-09-07 MED ORDER — QUETIAPINE FUMARATE 25 MG PO TABS: 25.0000 mg | ORAL_TABLET | Freq: Every day | ORAL | Status: DC

## 2012-09-07 MED ORDER — SERTRALINE HCL 50 MG PO TABS
50.0000 mg | ORAL_TABLET | Freq: Every morning | ORAL | Status: DC
  Administered 2012-09-07: 50 mg via ORAL
  Filled 2012-09-07: qty 1

## 2012-09-07 NOTE — ED Provider Notes (Signed)
CSN: [id number]     Arrival date & time 09/06/12  2301 History     First MD Initiated Contact with Patient 09/06/12 2324     Chief Complaint  Patient presents with  . Medical Clearance   (Consider location/radiation/quality/duration/timing/severity/associated sxs/prior Treatment) The history is provided by the patient and medical records. No language interpreter was used.   37 year old female with a past medical history of chronic pain, depression, narcolepsy, seizure presents emergency department chief complaint of back pain, anxiety and suicidal ideation.  Patient was admitted to behavioral health for her depression and released one week ago.  She states she is taking Seroquel and Zoloft with  little relief of her depression.  Patient states that she was evaluated for chronic pain and has a positive ANA anti-body, however she's been unable to followup with rheumatology due to her lack of access to care.  Patient states this is a has been raining outside she has had increased severe systemic pain in her skin, joints and muscles.  She has been taking Tylenol without relief.  She states that because her pain is so much worse she thinks it is also making her depression worse and she has been hoping she will wake up dead.  He denies any active suicidal ideation, plan to commit suicide.  She does have a family history of multiple cousins with completed suicide.  She denies homicidal ideation or audiovisual hallucinations. Denies fevers, chills, myalgias, arthralgias. Denies DOE, SOB, chest tightness or pressure, radiation to left arm, jaw or back, or diaphoresis. Denies dysuria, flank pain, suprapubic pain, frequency, urgency, or hematuria. Denies headaches, light headedness, weakness, visual disturbances. Denies abdominal pain, nausea, vomiting, diarrhea or constipation.    Past Medical History  Diagnosis Date  . Seizures   . Narcolepsy   . Restless leg   . Depression   . Hyperlipidemia   .  Allergic rhinitis   . Narcolepsy   . Diabetes mellitus without complication   . Migraine    Past Surgical History  Procedure Laterality Date  . Partial hysterectomy  2006  . Appendectomy  2006  . Tubal ligation  2002  . Appendectomy  2006  . Abdominal hysterectomy     Family History  Problem Relation Age of Onset  . Heart attack Mother   . Diabetes Father   . Heart attack Father    History  Substance Use Topics  . Smoking status: Never Smoker   . Smokeless tobacco: Never Used  . Alcohol Use: No   OB History   Grav Para Term Preterm Abortions TAB SAB Ect Mult Living                 Review of Systems  Ten systems reviewed and are negative for acute change, except as noted in the HPI.   Allergies  Sulfa antibiotics; Eggs or egg-derived products; Metoclopramide hcl; and Penicillins  Home Medications   Current Outpatient Rx  Name  Route  Sig  Dispense  Refill  . acetaminophen (TYLENOL) 650 MG CR tablet   Oral   Take 650 mg by mouth every 8 (eight) hours as needed for pain.         . clonazePAM (KLONOPIN) 0.5 MG tablet   Oral   Take 1 tablet (0.5 mg total) by mouth 2 (two) times daily as needed for anxiety.   20 tablet   0   . lamoTRIgine (LAMICTAL) 25 MG tablet   Oral   Take 25 mg by mouth See  admin instructions. Take 1 tablet once daily for 2 weeks, then 1 tablet twice daily for 2 weeks, then 2 tablets twice daily for 1 week.         . lansoprazole (PREVACID) 15 MG capsule   Oral   Take 15 mg by mouth every evening.         . Levetiracetam (KEPPRA XR) 750 MG TB24   Oral   Take 2 tablets (1,500 mg total) by mouth 2 (two) times daily.   120 tablet   12   . loratadine (CLARITIN) 10 MG tablet   Oral   Take 10 mg by mouth daily as needed for allergies.         . metFORMIN (GLUCOPHAGE) 500 MG tablet   Oral   Take 500 mg by mouth 2 (two) times daily with a meal.         . QUEtiapine (SEROQUEL) 25 MG tablet   Oral   Take 1 tablet (25 mg total)  by mouth at bedtime.   30 tablet   0   . rOPINIRole (REQUIP) 3 MG tablet   Oral   Take 1 tablet (3 mg total) by mouth at bedtime.         . sertraline (ZOLOFT) 50 MG tablet   Oral   Take 50 mg by mouth every morning.          BP 118/68  Pulse 69  Temp(Src) 98.4 F (36.9 C) (Oral)  Resp 18  Ht 5\' 6"  (1.676 m)  Wt 192 lb (87.091 kg)  BMI 31 kg/m2  SpO2 98% Physical Exam Physical Exam  Nursing note and vitals reviewed. Constitutional: She is oriented to person, place, and time. She appears well-developed and well-nourished. No distress.  HENT:  Head: Normocephalic and atraumatic.  Eyes: Conjunctivae normal and EOM are normal. Pupils are equal, round, and reactive to light. No scleral icterus.  Neck: Normal range of motion.  Cardiovascular: Normal rate, regular rhythm and normal heart sounds.  Exam reveals no gallop and no friction rub.   No murmur heard. Pulmonary/Chest: Effort normal and breath sounds normal. No respiratory distress.  Abdominal: Soft. Bowel sounds are normal. She exhibits no distension and no mass. There is no tenderness. There is no guarding.  Neurological: She is alert and oriented to person, place, and time.  Skin: Skin is warm and dry. She is not diaphoretic.  Psych: dysphoric   ED Course   Procedures (including critical care time)  Labs Reviewed  CBC - Abnormal; Notable for the following:    MCV 77.2 (*)    MCH 25.3 (*)    All other components within normal limits  COMPREHENSIVE METABOLIC PANEL - Abnormal; Notable for the following:    Potassium 3.4 (*)    Glucose, Bld 129 (*)    AST 51 (*)    ALT 66 (*)    All other components within normal limits  SALICYLATE LEVEL - Abnormal; Notable for the following:    Salicylate Lvl <2.0 (*)    All other components within normal limits  GLUCOSE, CAPILLARY - Abnormal; Notable for the following:    Glucose-Capillary 129 (*)    All other components within normal limits  ACETAMINOPHEN LEVEL   ETHANOL  URINE RAPID DRUG SCREEN (HOSP PERFORMED)  POCT PREGNANCY, URINE   No results found. 1. Anxiety   2. Depression     MDM  1:21 AM BP 118/68  Pulse 69  Temp(Src) 98.4 F (36.9 C) (Oral)  Resp  18  Ht 5\' 6"  (1.676 m)  Wt 192 lb (87.091 kg)  BMI 31 kg/m2  SpO2 98% Patient with passive suicidal ideation. I believe whe may need medication adjustment. I feel she is low risk for completed suicide as she is only having passive ideation.  Will have telepsych consult for med adjustment. I do not feel she meets criteria for inpatient treatment.  Arthor Captain, PA-C 09/07/12 1555

## 2012-09-07 NOTE — ED Notes (Signed)
Telepsych in progress. 

## 2012-09-07 NOTE — ED Notes (Signed)
Spoke with Ginger with [facility] to notify of need for consult.

## 2012-09-07 NOTE — ED Notes (Signed)
Written dc instructions reviewed w/ pt.  Pt encouraged to follow w/ her MD and return/seek treatment for any return of suicidal/homicidal thoughts.

## 2012-09-07 NOTE — ED Notes (Signed)
Request for tele-psych consult faxed to [facility].

## 2012-09-07 NOTE — BH Assessment (Signed)
[facility] Assessment Progress Note     Dr. Lolly Mustache recommends dc/  ACT contacted the spouse.  He did not verbalize any concerns about pt hurting self or others and was agreeable to come and pick up pt.  Dr. Judd Lien will put up for dc.  Pt is to follow up with psychiatrist.  Spouse confirmed pt has apt next week with psychiatrist.

## 2012-09-07 NOTE — ED Notes (Signed)
Pt's mom will pick her up after church.

## 2012-09-07 NOTE — ED Notes (Signed)
Patient has a black cell phone, flip flops, shorts, shirt, bra, and panties.

## 2012-09-07 NOTE — BH Assessment (Signed)
Assessment Note   [name] is an 37 y.o. female. Pt presents voluntarily to [facility] with thoughts of self harm and pain all over her body. She says she called [facility] who directed her to come to the ED. She was inpatient at [facility] and d/c 08/22/12. Pt has an appt 09/15/12 with a new psychiatrist, Rosezetta Schlatter(?). Pt endorses SI but denies intent or plan. She says, "I have had thoughts of suicide again and wanting to go to sleep and not wake up". Pt denies prior attempts. She endorses depressed mood with isolating behavior, loss of interest, poor appetite, tearfulness, fatigue, guilt, worthlessness. She endorses moderate anxiety but states her anxiety "isn't too bad if I'm not around crowds". Pt denies [facility] and no delusions noted. She reports poor concentration and remote and recent memory impairment. Her affect is depressed. Dr Lanell Matar telepsych consult recommends inpatient treatment.    Axis I: Major Depressive Disorder, Recurrent, Severe without Psychotic Features Axis II: Deferred Axis III:  Past Medical History  Diagnosis Date  . Seizures   . Narcolepsy   . Restless leg   . Depression   . Hyperlipidemia   . Allergic rhinitis   . Narcolepsy   . Diabetes mellitus without complication   . Migraine    Axis IV: other psychosocial or environmental problems and problems related to social environment Axis V: 31-40 impairment in reality testing  Past Medical History:  Past Medical History  Diagnosis Date  . Seizures   . Narcolepsy   . Restless leg   . Depression   . Hyperlipidemia   . Allergic rhinitis   . Narcolepsy   . Diabetes mellitus without complication   . Migraine     Past Surgical History  Procedure Laterality Date  . Partial hysterectomy  2006  . Appendectomy  2006  . Tubal ligation  2002  . Appendectomy  2006  . Abdominal hysterectomy      Family History:  Family History  Problem Relation Age of Onset  . Heart attack Mother   . Diabetes Father   .  Heart attack Father     Social History:  reports that she has never smoked. She has never used smokeless tobacco. She reports that she does not drink alcohol or use illicit drugs.  Additional Social History:  Alcohol / Drug Use Pain Medications: see PTA meds - pt denies abuse Prescriptions: see PTA meds - pt denies abuse Over the Counter: see PTA meds - pt denies abuse History of alcohol / drug use?: No history of alcohol / drug abuse  CIWA: CIWA-Ar BP: 117/75 mmHg Pulse Rate: 70 COWS:    Allergies:  Allergies  Allergen Reactions  . Sulfa Antibiotics Other (See Comments)    Tongue swelling  . Eggs Or Egg-Derived Products Nausea And Vomiting  . Metoclopramide Hcl Other (See Comments)    Intensifies restless leg  . Penicillins Itching    Home Medications:  (Not in a hospital admission)  OB/GYN Status:  No LMP recorded. Patient has had a hysterectomy.  General Assessment Data Location of Assessment: [facility] ED Living Arrangements: Spouse/significant other;Children Can pt return to current living arrangement?: Yes Admission Status: Voluntary Is patient capable of signing voluntary admission?: Yes Transfer from: Acute Hospital Referral Source: Self/Family/Friend  Education Status Is patient currently in school?: No Current Grade: na Highest grade of school patient has completed: 12  Risk to self Suicidal Ideation: Yes-Currently Present Suicidal Intent: No Is patient at risk for suicide?: No Suicidal Plan?:  No Access to Means: No What has been your use of drugs/alcohol within the last 12 months?: none Previous Attempts/Gestures: No How many times?: 0 Other Self Harm Risks: none Triggers for Past Attempts:  (n/a) Intentional Self Injurious Behavior: None Family Suicide History: No Recent stressful life event(s): Recent negative physical changes Persecutory voices/beliefs?: No Depression: Yes Depression Symptoms: Despondent;Feeling worthless/self pity;Loss of interest  in usual pleasures;Guilt;Fatigue;Isolating;Tearfulness Substance abuse history and/or treatment for substance abuse?: No Suicide prevention information given to non-admitted patients: Not applicable  Risk to Others Homicidal Ideation: No Thoughts of Harm to Others: No Current Homicidal Intent: No Current Homicidal Plan: No Access to Homicidal Means: No Identified Victim: none History of harm to others?: No Assessment of Violence: None Noted Violent Behavior Description: pt calm and polite Does patient have access to weapons?: No Criminal Charges Pending?: No Does patient have a court date: No  Psychosis Hallucinations: None noted Delusions: None noted  Mental Status Report Appear/Hygiene: Other (Comment) (appropriate) Eye Contact: Good Motor Activity: Freedom of movement Speech: Logical/coherent;Soft Level of Consciousness: Alert Mood: Depressed;Anxious;Sad;Anhedonia Affect: Appropriate to circumstance;Depressed;Sad Anxiety Level: Moderate Thought Processes: Relevant;Coherent Judgement: Unimpaired Orientation: Person;Place;Time;Situation Obsessive Compulsive Thoughts/Behaviors: None  Cognitive Functioning Concentration: Decreased Memory: Remote Impaired;Recent Impaired IQ: Average Insight: Fair Impulse Control: Fair Appetite: Poor Weight Loss:  (pt doesn't amount lost) Weight Gain: 0 Sleep: No Change Total Hours of Sleep: 7 (when her meds work) Vegetative Symptoms: None  ADLScreening [facility] Assessment Services) Patient's cognitive ability adequate to safely complete daily activities?: Yes Patient able to express need for assistance with ADLs?: Yes Independently performs ADLs?: Yes (appropriate for developmental age)  Abuse/Neglect [facility]) Physical Abuse: Denies Verbal Abuse: Denies Sexual Abuse: Denies  Prior Inpatient Therapy Prior Inpatient Therapy: Yes Prior Therapy Dates: July 2014 Prior Therapy Facilty/Provider(s): [facility] Reason for Treatment:  MDD  Prior Outpatient Therapy Prior Outpatient Therapy: No Prior Therapy Dates: na Prior Therapy Facilty/Provider(s): na Reason for Treatment: na  ADL Screening (condition at time of admission) Patient's cognitive ability adequate to safely complete daily activities?: Yes Is the patient deaf or have difficulty hearing?: No Does the patient have difficulty seeing, even when wearing glasses/contacts?: No Does the patient have difficulty concentrating, remembering, or making decisions?: Yes Patient able to express need for assistance with ADLs?: Yes Does the patient have difficulty dressing or bathing?: Yes Independently performs ADLs?: Yes (appropriate for developmental age) Does the patient have difficulty walking or climbing stairs?: No Weakness of Legs: None Weakness of Arms/Hands: None  Home Assistive Devices/Equipment Home Assistive Devices/Equipment: None    Abuse/Neglect Assessment (Assessment to be complete while patient is alone) Physical Abuse: Denies Verbal Abuse: Denies Sexual Abuse: Denies Exploitation of patient/patient's resources: Denies Self-Neglect: Denies Values / Beliefs Cultural Requests During Hospitalization: None Spiritual Requests During Hospitalization: None        Additional Information 1:1 In Past 12 Months?: No CIRT Risk: No Elopement Risk: No Does patient have medical clearance?: Yes     Disposition:  Disposition Initial Assessment Completed for this Encounter: Yes Disposition of Patient: Inpatient treatment program Type of inpatient treatment program: Adult  On Site Evaluation by:   Reviewed with Physician:     Donnamarie Rossetti P 09/07/2012 5:31 AM

## 2012-09-07 NOTE — ED Notes (Signed)
Ice pack given for HA.

## 2012-09-07 NOTE — ED Notes (Signed)
Up to the desk on the phone 

## 2012-09-07 NOTE — ED Notes (Signed)
Pt laying in bed.  Pt aaox3.  Pt deneis SI/HI/AH/VH.  Pt present with c/o incresing anxiety.  Pt reports hx SI.  Pt reported SI "earlier today", pt denies having a plan.

## 2012-09-07 NOTE — Consult Note (Signed)
Reason for Consult:Suicidal Thought  Referring Physician: Dr Judd Lien  [name] is an 37 y.o. female.  HPI: Patient is 37 years old Caucasian female presented voluntary to ER requesting help. She called [facility] earlier because having suicidal thoughts but no plan.  The patient was easily discharged from behavioral center on 08/22/2012.  Patient has appointment with Rosezetta Schlatter at [facility].  Patient do not recall any stressors that causes suicidal thinking but admitted chronic pain and lack of energy.  Patient denies any previous history of suicidal attempt.  She is compliant with medication.  She has chronic depression with decrease in appetite, tearfulness fatigue and lack of energy.  She lives with her husband and 2 kids.  Patient denies any paranoia or any hallucinations.  She currently denies any suicidal thoughts I like to keep appointment with her psychiatrist.  She's not agitated irritable angry but admitted chronic depression.  She feels that her medicine needs to be adjusted which she will discuss with her psychiatrist or her appointment.  Mental status examination The patient appears to be her stated age.  Her speech is slow but coherent.  She describes her mood as sad and her affect is constricted.  She denies any active or passive suicidal thoughts.  There were no paranoia or delusional thinking present at this time.  Her psychomotor activity is slightly slowed.  Her fund of knowledge is adequate.  There were no tremors or shakes.  There were no auditory or visual hallucination.  She is alert and oriented x3.  Her insight judgment and impulse control is okay.  Past Medical History  Diagnosis Date  . Seizures   . Narcolepsy   . Restless leg   . Depression   . Hyperlipidemia   . Allergic rhinitis   . Narcolepsy   . Diabetes mellitus without complication   . Migraine     Past Surgical History  Procedure Laterality Date  . Partial hysterectomy  2006  . Appendectomy   2006  . Tubal ligation  2002  . Appendectomy  2006  . Abdominal hysterectomy      Family History  Problem Relation Age of Onset  . Heart attack Mother   . Diabetes Father   . Heart attack Father     Social History:  reports that she has never smoked. She has never used smokeless tobacco. She reports that she does not drink alcohol or use illicit drugs.  Allergies:  Allergies  Allergen Reactions  . Sulfa Antibiotics Other (See Comments)    Tongue swelling  . Eggs Or Egg-Derived Products Nausea And Vomiting  . Metoclopramide Hcl Other (See Comments)    Intensifies restless leg  . Penicillins Itching    Medications: I have reviewed the patient's current medications.  Results for orders placed during the hospital encounter of 09/06/12 (from the past 48 hour(s))  URINE RAPID DRUG SCREEN (HOSP PERFORMED)     Status: None   Collection Time    09/07/12 12:01 AM      Result Value Range   Opiates NONE DETECTED  NONE DETECTED   Cocaine NONE DETECTED  NONE DETECTED   Benzodiazepines NONE DETECTED  NONE DETECTED   Amphetamines NONE DETECTED  NONE DETECTED   Tetrahydrocannabinol NONE DETECTED  NONE DETECTED   Barbiturates NONE DETECTED  NONE DETECTED   Comment:            DRUG SCREEN FOR MEDICAL PURPOSES     ONLY.  IF CONFIRMATION IS NEEDED  FOR ANY PURPOSE, NOTIFY LAB     WITHIN 5 DAYS.                LOWEST DETECTABLE LIMITS     FOR URINE DRUG SCREEN     Drug Class       Cutoff (ng/mL)     Amphetamine      1000     Barbiturate      200     Benzodiazepine   200     Tricyclics       300     Opiates          300     Cocaine          300     THC              50  ACETAMINOPHEN LEVEL     Status: None   Collection Time    09/07/12 12:19 AM      Result Value Range   Acetaminophen (Tylenol), Serum <15.0  10 - 30 ug/mL   Comment:            THERAPEUTIC CONCENTRATIONS VARY     SIGNIFICANTLY. A RANGE OF 10-30     ug/mL MAY BE AN EFFECTIVE     CONCENTRATION FOR MANY  PATIENTS.     HOWEVER, SOME ARE BEST TREATED     AT CONCENTRATIONS OUTSIDE THIS     RANGE.     ACETAMINOPHEN CONCENTRATIONS     >150 ug/mL AT 4 HOURS AFTER     INGESTION AND >50 ug/mL AT 12     HOURS AFTER INGESTION ARE     OFTEN ASSOCIATED WITH TOXIC     REACTIONS.  CBC     Status: Abnormal   Collection Time    09/07/12 12:19 AM      Result Value Range   WBC 5.3  4.0 - 10.5 K/uL   RBC 4.78  3.87 - 5.11 MIL/uL   Hemoglobin 12.1  12.0 - 15.0 g/dL   HCT 16.1  09.6 - 04.5 %   MCV 77.2 (*) 78.0 - 100.0 fL   MCH 25.3 (*) 26.0 - 34.0 pg   MCHC 32.8  30.0 - 36.0 g/dL   RDW 40.9  81.1 - 91.4 %   Platelets 261  150 - 400 K/uL  COMPREHENSIVE METABOLIC PANEL     Status: Abnormal   Collection Time    09/07/12 12:19 AM      Result Value Range   Sodium 138  135 - 145 mEq/L   Potassium 3.4 (*) 3.5 - 5.1 mEq/L   Chloride 101  96 - 112 mEq/L   CO2 29  19 - 32 mEq/L   Glucose, Bld 129 (*) 70 - 99 mg/dL   BUN 8  6 - 23 mg/dL   Creatinine, Ser 7.82  0.50 - 1.10 mg/dL   Calcium 9.6  8.4 - 95.6 mg/dL   Total Protein 7.4  6.0 - 8.3 g/dL   Albumin 3.8  3.5 - 5.2 g/dL   AST 51 (*) 0 - 37 U/L   ALT 66 (*) 0 - 35 U/L   Alkaline Phosphatase 89  39 - 117 U/L   Total Bilirubin 0.5  0.3 - 1.2 mg/dL   GFR calc non Af Amer >90  >90 mL/min   GFR calc Af Amer >90  >90 mL/min   Comment:            The eGFR has been calculated  using the CKD EPI equation.     This calculation has not been     validated in all clinical     situations.     eGFR's persistently     <90 mL/min signify     possible Chronic Kidney Disease.  ETHANOL     Status: None   Collection Time    09/07/12 12:19 AM      Result Value Range   Alcohol, Ethyl (B) <11  0 - 11 mg/dL   Comment:            LOWEST DETECTABLE LIMIT FOR     SERUM ALCOHOL IS 11 mg/dL     FOR MEDICAL PURPOSES ONLY  SALICYLATE LEVEL     Status: Abnormal   Collection Time    09/07/12 12:19 AM      Result Value Range   Salicylate Lvl <2.0 (*) 2.8 - 20.0  mg/dL  POCT PREGNANCY, URINE     Status: None   Collection Time    09/07/12  1:11 AM      Result Value Range   Preg Test, Ur NEGATIVE  NEGATIVE   Comment:            THE SENSITIVITY OF THIS     METHODOLOGY IS >24 mIU/mL  GLUCOSE, CAPILLARY     Status: Abnormal   Collection Time    09/07/12  8:29 AM      Result Value Range   Glucose-Capillary 129 (*) 70 - 99 mg/dL    No results found.  Review of Systems  Neurological: Positive for headaches.  Psychiatric/Behavioral: Positive for depression. Negative for suicidal ideas, hallucinations and substance abuse. The patient is nervous/anxious. The patient does not have insomnia.    Blood pressure 108/71, pulse 71, temperature 97.8 F (36.6 C), temperature source Oral, resp. rate 16, height 5\' 6"  (1.676 m), weight 192 lb (87.091 kg), SpO2 99.00%. Physical Exam  Assessment/Plan: Axis I Maj. depressive disorder recurrent  Plan Patient at this time does not have any suicidal thoughts or homicidal thoughts.  She liked to keep appointment with her therapist and psychiatrist.  She has appointment on August 11 with Karn Cassis at mental health in [place].  Patient like to be discharge today.  She lives with her husband and 2 kids.  Recommend to continue outpatient services and keep that appointment.  Lantz Hermann T. 09/07/2012, 11:30 AM

## 2012-09-07 NOTE — ED Provider Notes (Signed)
Medical screening examination/treatment/procedure(s) were performed by non-physician practitioner and as supervising physician I was immediately available for consultation/collaboration.  Jones Skene, M.D.      Jones Skene, MD 09/07/12 2255

## 2012-09-08 NOTE — Telephone Encounter (Signed)
LMOVM for patient to return call concerning results.  

## 2012-09-08 NOTE — Progress Notes (Signed)
Seen and agreed. Mariesa Grieder, MD 

## 2012-09-08 NOTE — Discharge Summary (Signed)
Seen and agreed. Christinna Sprung, MD 

## 2012-09-09 NOTE — Telephone Encounter (Signed)
Reviewed with pt.  At her husband's appointment.

## 2012-09-10 NOTE — Consult Note (Signed)
Agree with assessment and plan [name], M.D. 

## 2012-09-22 ENCOUNTER — Ambulatory Visit: Payer: Self-pay | Admitting: Neurology

## 2012-10-13 ENCOUNTER — Other Ambulatory Visit: Payer: Self-pay | Admitting: Neurology

## 2012-10-13 ENCOUNTER — Other Ambulatory Visit: Payer: Self-pay | Admitting: Family

## 2012-10-13 ENCOUNTER — Telehealth: Payer: Self-pay | Admitting: Neurology

## 2012-10-13 DIAGNOSIS — R569 Unspecified convulsions: Secondary | ICD-10-CM

## 2012-10-13 MED ORDER — ROPINIROLE HCL 4 MG PO TABS: 4.0000 mg | ORAL_TABLET | Freq: Every day | ORAL | Status: DC

## 2012-10-13 MED ORDER — LAMOTRIGINE 25 MG PO TABS: ORAL_TABLET | ORAL | Status: DC

## 2012-10-13 NOTE — Telephone Encounter (Signed)
Received a refill request for [name] on her Lamictal. I called her to see how she was doing on it and to see if she could have blood work on Friday when she comes in for her EMG. She stated she could and then she told me that she had weaned herself off of her Keppra. I told her that wasn't a good thing to do as we weren't even sure her Lamicatal was at a therapeutic dose yet. She will come in tomorrow morning for a blood draw. Trough explained to the patient. She is not driving. She understands she may be at an increased risk for a seizure since she has already stopped her Keppra. She also wanted to know if Dr. Everlena Cooper could increase her Requip (takes the 3 mg at [facility]) as her legs are jumping every night as she is trying to fall asleep and thus she is not getting enough sleep. I told her I would check with him and see what he thinks and get back with her. She is ok to wait. **Dr. Everlena Cooper, please advise increasing her Requip?

## 2012-10-13 NOTE — Telephone Encounter (Signed)
Pt aware.

## 2012-10-13 NOTE — Telephone Encounter (Signed)
Received request from pharmacy for ropinirole 3mg  1 qhs. Upon review of EPIC I see that ropinirole 4mg  was sent to pharmacy today by pt's neurologist. 3mg  request denied.

## 2012-10-13 NOTE — Telephone Encounter (Signed)
We can try 4mg  at bedtime

## 2012-10-14 ENCOUNTER — Other Ambulatory Visit: Payer: Self-pay

## 2012-10-14 DIAGNOSIS — R569 Unspecified convulsions: Secondary | ICD-10-CM

## 2012-10-15 ENCOUNTER — Other Ambulatory Visit: Payer: Self-pay | Admitting: Neurology

## 2012-10-15 ENCOUNTER — Telehealth: Payer: Self-pay | Admitting: Neurology

## 2012-10-15 DIAGNOSIS — R569 Unspecified convulsions: Secondary | ICD-10-CM

## 2012-10-15 LAB — LAMOTRIGINE LEVEL: Lamotrigine Lvl: 3.5 ug/mL (ref 3.0–14.0)

## 2012-10-15 NOTE — Telephone Encounter (Signed)
Message copied by Benay Spice on Wed Oct 15, 2012  2:43 PM ------      Message from: JAFFE, ADAM R      Created: Wed Oct 15, 2012 10:28 AM       At this point, [name] should be taking Lamictal 50mg  BID.  I would like to further titrate up to goal of 100mg  BID:       Take 50mg  qAM and 100mg  qhs x 1 week       Then 100mg  BID x 1 week and then check another trough level.            ----- Message -----         From: [facility]         Sent: 10/15/2012   8:20 AM           To: Cira Servant, DO                   ------

## 2012-10-15 NOTE — Telephone Encounter (Signed)
Spoke with the patient. Instructions given as per Dr. Everlena Cooper. Will come in on the 26th for a Lamictal level; understands trough level is needed. Also coming in on Friday for EMG.

## 2012-10-17 ENCOUNTER — Ambulatory Visit: Payer: Self-pay | Admitting: Neurology

## 2012-10-31 ENCOUNTER — Ambulatory Visit (INDEPENDENT_AMBULATORY_CARE_PROVIDER_SITE_OTHER): Payer: Self-pay | Admitting: Neurology

## 2012-10-31 ENCOUNTER — Encounter: Payer: Self-pay | Admitting: Neurology

## 2012-10-31 DIAGNOSIS — R209 Unspecified disturbances of skin sensation: Secondary | ICD-10-CM

## 2012-10-31 DIAGNOSIS — R200 Anesthesia of skin: Secondary | ICD-10-CM

## 2012-10-31 NOTE — Procedures (Signed)
[facility]  [address] [place], [address]  [place], [place] [address] Tel: [phone] Fax:  [phone]  Test Date:  10/31/2012  Patient: [name] DOB: 1975/11/11 Physician: Shon Millet, DO  Sex: Female Height:  Ref Phys:   ID#: [id number]   Technician:    Patient Complaints: Longstanding history of numbness and tingling involving the 5 digit as well as the thumb of the right hand.  Positive Tinel's sign.  The patient's identification is ascertained.  The EMG procedure is explained to the patient in detail who appears to comprehend the discussion and verbal consent is obtained.  NCV & EMG Findings: The motor conductions of the right median nerve and right ulnar nerve are normal.  The sensory conductions of the right median nerve and right ulnar nerve (including palmar latencies) are normal.  There were no abnormalities on the needle electrode examination, when testing the right first dorsal interosseous, pronator teres, biceps, triceps, and deltoid muscles.  As the limb muscle was normal, the paraspinal muscle was not tested.  Impression: There were no abnormalities on the EMG.  There is no electrodiagnostic evidence for a right distal median neuropathy at the wrist (carpal tunnel syndrome), right ulnar neuropathy or right C5-T1 radiculopathy.  We are unable to explain the patient's symptoms on the above EMG findings.    ___________________________ Shon Millet, DO    Nerve Conduction Studies Anti Sensory Summary Table   Site NR Peak (ms) Norm Peak (ms) P-T Amp (V) Norm P-T Amp  Right Median Anti Sensory (2nd Digit)  Wrist    2.8 <3.5 39.1 >7  Right Ulnar Anti Sensory (5th Digit)  Wrist    2.5 <3.1 34.0 >5   Motor Summary Table   Site NR Onset (ms) Norm Onset (ms) O-P Amp (mV) Norm O-P Amp Site1 Site2 Delta-0 (ms) Dist (cm) Vel (m/s) Norm Vel (m/s)  Right Median Motor (Abd Poll Brev)  Wrist    2.5 <4.4 8.2 >4 Elbow Wrist 3.7 23.0 62 >49  Elbow    6.2  7.0          Right Ulnar Motor (Abd Dig Minimi)  Wrist    2.7 <3.5 9.7 >6 B Elbow Wrist 3.2 20.5 64 >49  B Elbow    5.9  9.0  A Elbow B Elbow 1.9 10.0 53 >49  A Elbow    7.8  8.9          Comparison Summary Table   Site NR Peak (ms) Norm Peak (ms) P-T Amp (V) Site1 Site2 Delta-P (ms) Norm Delta (ms)  Right Median/Ulnar Palm Comparison (Wrist - 8cm)  Median Palm    1.8 <2.2 44.1 Median Palm Ulnar Palm 0.0   Ulnar Palm    1.8 <2.2 37.4       EMG   Side Muscle Nerve Root Ins Act Fibs Psw Amp Dur Poly Recrt Int Dennie Bible Comment  Right 1stDorInt Ulnar C8-T1 Nml Nml Nml Nml Nml 0 Nml Nml   Right PronatorTeres Median C6-7 Nml Nml Nml Nml Nml 0 Nml Nml   Right Biceps Musculocut C5-6 Nml Nml Nml Nml Nml 0 Nml Nml   Right Triceps Radial C6-7-8 Nml Nml Nml Nml Nml 0 Nml Nml   Right Deltoid Axillary C5-6 Nml Nml Nml Nml Nml 0 Nml Nml       Waveforms:

## 2012-10-31 NOTE — Progress Notes (Signed)
Atypical presentation of possible right carpal tunnel syndrome with normal NCV-EMG.  Would like to try to send to hand specialist for possible trial of steroid injection (she said wrist splint didn't help).  However, she is self-pay and cannot afford to see the hand specialist at this time.

## 2012-11-03 ENCOUNTER — Other Ambulatory Visit: Payer: Self-pay | Admitting: Neurology

## 2012-11-03 MED ORDER — LAMOTRIGINE 25 MG PO TABS: ORAL_TABLET | ORAL | Status: DC

## 2012-11-11 ENCOUNTER — Ambulatory Visit (INDEPENDENT_AMBULATORY_CARE_PROVIDER_SITE_OTHER): Payer: Self-pay | Admitting: Neurology

## 2012-11-11 ENCOUNTER — Encounter: Payer: Self-pay | Admitting: Neurology

## 2012-11-11 VITALS — BP 112/72 | HR 78 | Temp 98.4°F | Ht 66.0 in | Wt 192.0 lb

## 2012-11-11 DIAGNOSIS — G40309 Generalized idiopathic epilepsy and epileptic syndromes, not intractable, without status epilepticus: Secondary | ICD-10-CM

## 2012-11-11 DIAGNOSIS — F518 Other sleep disorders not due to a substance or known physiological condition: Secondary | ICD-10-CM

## 2012-11-11 DIAGNOSIS — M79609 Pain in unspecified limb: Secondary | ICD-10-CM

## 2012-11-11 DIAGNOSIS — G40909 Epilepsy, unspecified, not intractable, without status epilepticus: Secondary | ICD-10-CM

## 2012-11-11 DIAGNOSIS — M79641 Pain in right hand: Secondary | ICD-10-CM

## 2012-11-11 MED ORDER — GABAPENTIN 300 MG PO CAPS: 300.0000 mg | ORAL_CAPSULE | Freq: Every day | ORAL | Status: DC

## 2012-11-11 NOTE — Patient Instructions (Addendum)
1.  Continue Lamictal 100mg  twice daily. 2.  For hand pain, we will try gabapentin 300mg  at bedtime. 3.  Contact us when you are back on the assistance program, so we can further evaluate hand pain and arm numbness. 4.  No driving until at least January 20 (as long as no longer having seizures). 4.  Follow around January 20 (or after).

## 2012-11-11 NOTE — Progress Notes (Signed)
NEUROLOGY FOLLOW UP OFFICE NOTE  [name] [id number]  HISTORY OF PRESENT ILLNESS: [name] is a 37 year old female with depression, anxiety, migraine and restless leg syndrome who presents with idiopathic generalized seizure disorder.  Records and images were personally reviewed where available.    She began having seizures in 2009.  There was no precipitating factor.  Her seizures present as generalized tonic-clonic.  Reportedly, she has had several seizures while asleep, as she sometimes wakes up having bit her tongue but no blood on the pillow.  No witnesses to seizure activity at night.  She usually has migraine the next day.  No prior history of seizure that she is aware.  She had been taking Keppra.  Last visit, we decided to switch from Keppra to Lamictal, due to the mood changes of Keppra, as well as to achieve better seizure control.  We started Lamictal.  On 50mg  BID, the trough level was 3.5.  We then titrated the dose further to 100mg  BID.  She has not had a seizure since 08/24/12.  Last visit, she also numbness and pain involving the right hand.  She reports numbness involving the thumb and index finger, as well as aching hand and wrist pain and shooting pain in forearm and into hyperthenar eminence.  She denies neck pain shooting down the arm.  It's constant and particular uncomfortable laying in bed.  The other day, she dropped a pot of pasta.  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  NCV-EMG was performed on 10/31/12 and was normal.  Mild carpal tunnel syndrome, undetected on electrodiagnostic testing was a possibility and I recommended evaluation by a hand specialist, but she cannot afford to see one at this time.   She tried wrist splint, but she didn't notice improvement and it was uncomfortable on her wrist anyway.  She also asked me about these jerks she would get while falling asleep.  It would wake her up.  Her husband was concerned she was having a seizure.  She has poor  sleep lately.  PAST MEDICAL HISTORY: Past Medical History  Diagnosis Date  . Seizures   . Narcolepsy   . Restless leg   . Depression   . Hyperlipidemia   . Allergic rhinitis   . Narcolepsy   . Diabetes mellitus without complication   . Migraine     MEDICATIONS: Current Outpatient Prescriptions on File Prior to Visit  Medication Sig Dispense Refill  . acetaminophen (TYLENOL) 650 MG CR tablet Take 650 mg by mouth every 8 (eight) hours as needed for pain.      . clonazePAM (KLONOPIN) 0.5 MG tablet Take 1 tablet (0.5 mg total) by mouth 2 (two) times daily as needed for anxiety.  20 tablet  0  . lamoTRIgine (LAMICTAL) 25 MG tablet Take 4 tablets (100 mg total) twice a day.  240 tablet  1  . lansoprazole (PREVACID) 15 MG capsule Take 15 mg by mouth every evening.      . loratadine (CLARITIN) 10 MG tablet Take 10 mg by mouth daily as needed for allergies.      . metFORMIN (GLUCOPHAGE) 500 MG tablet Take 500 mg by mouth 2 (two) times daily with a meal.      . QUEtiapine (SEROQUEL) 25 MG tablet Take 1 tablet (25 mg total) by mouth at bedtime.  30 tablet  0  . rOPINIRole (REQUIP) 4 MG tablet Take 1 tablet (4 mg total) by mouth at bedtime.  30 tablet  1  .  sertraline (ZOLOFT) 50 MG tablet Take 50 mg by mouth every morning.      . [DISCONTINUED] desvenlafaxine (PRISTIQ) 100 MG 24 hr tablet Take 100 mg by mouth daily.         No current facility-administered medications on file prior to visit.    ALLERGIES: Allergies  Allergen Reactions  . Sulfa Antibiotics Other (See Comments)    Tongue swelling  . Eggs Or Egg-Derived Products Nausea And Vomiting  . Metoclopramide Hcl Other (See Comments)    Intensifies restless leg  . Penicillins Itching    FAMILY HISTORY: Family History  Problem Relation Age of Onset  . Heart attack Mother   . Diabetes Father   . Heart attack Father     SOCIAL HISTORY: History   Social History  . Marital Status: Married    Spouse Name: N/A    Number of  Children: 2  . Years of Education: N/A   Occupational History  . Unemployed- full time student     Completed 12th grade   Social History Main Topics  . Smoking status: Never Smoker   . Smokeless tobacco: Never Used  . Alcohol Use: No  . Drug Use: No  . Sexual Activity: Not on file   Other Topics Concern  . Not on file   Social History Narrative  . No narrative on file    PHYSICAL EXAM: Filed Vitals:   11/11/12 1003  BP: 112/72  Pulse: 78  Temp: 98.4 F (36.9 C)   General: No acute distress Head:  Normocephalic/atraumatic Neck: supple, no paraspinal tenderness, full range of motion Back: No paraspinal tenderness Neurological Exam: alert and oriented to person, place, and time, speech fluent and not dysarthric, language intact.  CN II-XII intact, bulk and tone normal, muscle strength 5/5 throughout, reduced pinprick in right thumb, index finger and lateral right arm up to and including the trapezius muscle and neck.  Gait normal.  IMPRESSION: 1.  Idiopathic generalized seizure disorder.  Controlled. 2.  Right hand pain and numbness.  Still possibly an electrodiagnostic negative carpal tunnel syndrome.  C6 radiculopathy also possible, although she does not exhibit neck or radicular-type pain. 3.  Hypnic jerks.  I do not think they are seizures.  PLAN: 1.  Continue Lamictal 100mg  BID 2.  Regarding hand pain and numbness:  Further testing and options limited as she does not have insurance at this time.  Further management includes referral to hand specialist vs MRI of cervical spine.  At this point, we will try gabapentin 300mg  qhs to see if it helps with the pain and discomfort.  Instructed to call us once she is back on the assistance program, so we can continue appropriate testing and management. 3.  As long as she remains seizure-free, driving restrictions apply until February 24, 2013. 4.  Regarding hypnic jerks, continue to optimize management of stress and anxiety  4.   Follow up on or soon after February 24, 2013.  Shon Millet, DO  CC:  Sandford Craze, NP

## 2012-11-14 ENCOUNTER — Ambulatory Visit: Payer: Self-pay | Admitting: Neurology

## 2012-12-11 ENCOUNTER — Other Ambulatory Visit: Payer: Self-pay

## 2012-12-15 ENCOUNTER — Other Ambulatory Visit: Payer: Self-pay | Admitting: Neurology

## 2012-12-15 MED ORDER — ROPINIROLE HCL 4 MG PO TABS: 4.0000 mg | ORAL_TABLET | Freq: Every day | ORAL | Status: DC

## 2012-12-22 ENCOUNTER — Ambulatory Visit (INDEPENDENT_AMBULATORY_CARE_PROVIDER_SITE_OTHER): Payer: No Typology Code available for payment source | Admitting: Family

## 2012-12-22 ENCOUNTER — Encounter: Payer: Self-pay | Admitting: Family

## 2012-12-22 VITALS — BP 90/70 | HR 71 | Temp 98.0°F | Resp 16 | Wt 184.1 lb

## 2012-12-22 DIAGNOSIS — K529 Noninfective gastroenteritis and colitis, unspecified: Secondary | ICD-10-CM

## 2012-12-22 DIAGNOSIS — K5289 Other specified noninfective gastroenteritis and colitis: Secondary | ICD-10-CM

## 2012-12-22 DIAGNOSIS — F329 Major depressive disorder, single episode, unspecified: Secondary | ICD-10-CM

## 2012-12-22 NOTE — Patient Instructions (Signed)
Please follow up in the next month. Call if diarrhea worsens. Go to ER if you develop worsening abdominal pain.

## 2012-12-22 NOTE — Progress Notes (Signed)
Subjective:    Patient ID: [name], female    DOB: 29-Jun-1975, 37 y.o.   MRN: [id number]  HPI  Seeing psychiatry at [facility].  Has apt to return.  Reports that her depression is currently well controlled.  Diarrhea-  Reports that diarrhea started  10 days ago with vomiting and diarrhea.  Reports nausea/vomitting stopped.  Only urinate once today, drinking sprite. Reports + fever on Friday night.  Tmax 101.3.  Took ibuprofen. Since that time she has not had any further fever. Reports HA for 2-3 days. Reports she has not had antibiotics in a long time.Has not had any diarrhea today.  Reports that her husband has also had diarrhea.     Review of Systems    see HPI  Past Medical History  Diagnosis Date  . Seizures   . Narcolepsy   . Restless leg   . Depression   . Hyperlipidemia   . Allergic rhinitis   . Narcolepsy   . Diabetes mellitus without complication   . Migraine     History   Social History  . Marital Status: Married    Spouse Name: N/A    Number of Children: 2  . Years of Education: N/A   Occupational History  . Unemployed- full time student     Completed 12th grade   Social History Main Topics  . Smoking status: Never Smoker   . Smokeless tobacco: Never Used  . Alcohol Use: No  . Drug Use: No  . Sexual Activity: Not on file   Other Topics Concern  . Not on file   Social History Narrative  . No narrative on file    Past Surgical History  Procedure Laterality Date  . Partial hysterectomy  2006  . Appendectomy  2006  . Tubal ligation  2002  . Appendectomy  2006  . Abdominal hysterectomy      Family History  Problem Relation Age of Onset  . Heart attack Mother   . Diabetes Father   . Heart attack Father     Allergies  Allergen Reactions  . Sulfa Antibiotics Other (See Comments)    Tongue swelling  . Eggs Or Egg-Derived Products Nausea And Vomiting  . Metoclopramide Hcl Other (See Comments)    Intensifies restless leg  . Penicillins  Itching    Current Outpatient Prescriptions on File Prior to Visit  Medication Sig Dispense Refill  . lamoTRIgine (LAMICTAL) 25 MG tablet Take 4 tablets (100 mg total) twice a day.  240 tablet  1  . lansoprazole (PREVACID) 15 MG capsule Take 15 mg by mouth every evening.      . metFORMIN (GLUCOPHAGE) 500 MG tablet Take 500 mg by mouth 2 (two) times daily with a meal.      . rOPINIRole (REQUIP) 4 MG tablet Take 1 tablet (4 mg total) by mouth at bedtime.  30 tablet  2  . sertraline (ZOLOFT) 50 MG tablet Take 50 mg by mouth every morning.      . [DISCONTINUED] desvenlafaxine (PRISTIQ) 100 MG 24 hr tablet Take 100 mg by mouth daily.         No current facility-administered medications on file prior to visit.    BP 90/70  Pulse 71  Temp(Src) 98 F (36.7 C) (Oral)  Resp 16  Wt 184 lb 1.3 oz (83.498 kg)  SpO2 99%    Objective:   Physical Exam  Constitutional: She is oriented to person, place, and time. She appears well-developed and well-nourished. No  distress.  HENT:  Head: Normocephalic and atraumatic.  Cardiovascular: Normal rate and regular rhythm.   No murmur heard. Pulmonary/Chest: Effort normal and breath sounds normal. No respiratory distress. She has no wheezes. She has no rales. She exhibits no tenderness.  Musculoskeletal: She exhibits no edema.  Neurological: She is alert and oriented to person, place, and time.  Psychiatric: She has a normal mood and affect. Her behavior is normal. Judgment and thought content normal.          Assessment & Plan:

## 2012-12-23 DIAGNOSIS — K529 Noninfective gastroenteritis and colitis, unspecified: Secondary | ICD-10-CM | POA: Insufficient documentation

## 2012-12-23 NOTE — Assessment & Plan Note (Signed)
Symptoms most consistent with acute viral gastroenteritis.  Advised supportive measures. Call if symptoms worsen or if not improved in 2-3 days.

## 2012-12-23 NOTE — Assessment & Plan Note (Signed)
Stable.  Encouraged pt to keep upcoming appointment with psychiatry.

## 2013-01-27 ENCOUNTER — Other Ambulatory Visit: Payer: Self-pay | Admitting: Family

## 2013-01-27 ENCOUNTER — Encounter: Payer: Self-pay | Admitting: Family

## 2013-01-27 ENCOUNTER — Ambulatory Visit (INDEPENDENT_AMBULATORY_CARE_PROVIDER_SITE_OTHER): Payer: No Typology Code available for payment source | Admitting: Family

## 2013-01-27 VITALS — BP 110/76 | HR 78 | Temp 98.0°F | Resp 16 | Ht 66.0 in | Wt 184.0 lb

## 2013-01-27 DIAGNOSIS — K219 Gastro-esophageal reflux disease without esophagitis: Secondary | ICD-10-CM

## 2013-01-27 DIAGNOSIS — F32A Depression, unspecified: Secondary | ICD-10-CM

## 2013-01-27 DIAGNOSIS — Z23 Encounter for immunization: Secondary | ICD-10-CM

## 2013-01-27 DIAGNOSIS — E785 Hyperlipidemia, unspecified: Secondary | ICD-10-CM

## 2013-01-27 DIAGNOSIS — R739 Hyperglycemia, unspecified: Secondary | ICD-10-CM

## 2013-01-27 DIAGNOSIS — F5089 Other specified eating disorder: Secondary | ICD-10-CM

## 2013-01-27 DIAGNOSIS — R7309 Other abnormal glucose: Secondary | ICD-10-CM

## 2013-01-27 DIAGNOSIS — F329 Major depressive disorder, single episode, unspecified: Secondary | ICD-10-CM

## 2013-01-27 LAB — LIPID PANEL
HDL: 36 mg/dL — ABNORMAL LOW (ref >39)
LDL Cholesterol: 138 mg/dL — ABNORMAL HIGH (ref 0–99)
Triglycerides: 98 mg/dL (ref <150)
VLDL: 20 mg/dL (ref 0–40)

## 2013-01-27 LAB — BASIC METABOLIC PANEL
CO2: 26 mEq/L (ref 19–32)
Chloride: 102 mEq/L (ref 96–112)
Potassium: 4.5 mEq/L (ref 3.5–5.3)

## 2013-01-27 LAB — CBC WITH DIFFERENTIAL/PLATELET
Basophils Relative: 0 % (ref 0–1)
Eosinophils Absolute: 0.1 10*3/uL (ref 0.0–0.7)
Eosinophils Relative: 1 % (ref 0–5)
HCT: 32.1 % — ABNORMAL LOW (ref 36.0–46.0)
Hemoglobin: 10.1 g/dL — ABNORMAL LOW (ref 12.0–15.0)
Lymphs Abs: 1.7 10*3/uL (ref 0.7–4.0)
MCH: 21.1 pg — ABNORMAL LOW (ref 26.0–34.0)
MCHC: 31.5 g/dL (ref 30.0–36.0)
MCV: 67.2 fL — ABNORMAL LOW (ref 78.0–100.0)
Neutrophils Relative %: 67 % (ref 43–77)
Platelets: 344 10*3/uL (ref 150–400)
WBC: 6.2 10*3/uL (ref 4.0–10.5)

## 2013-01-27 LAB — HEPATIC FUNCTION PANEL
Albumin: 4.1 g/dL (ref 3.5–5.2)
Indirect Bilirubin: 0.5 mg/dL (ref 0.0–0.9)
Total Bilirubin: 0.6 mg/dL (ref 0.3–1.2)
Total Protein: 6.9 g/dL (ref 6.0–8.3)

## 2013-01-27 MED ORDER — SERTRALINE HCL 50 MG PO TABS: 50.0000 mg | ORAL_TABLET | Freq: Every morning | ORAL | Status: DC

## 2013-01-27 NOTE — Patient Instructions (Addendum)
Increase prilosec 20mg  to 40mg  (2 tabs). Complete lab work prior to leaving. Follow up in 6 weeks, sooner if problems/concerns.

## 2013-01-27 NOTE — Progress Notes (Signed)
Pre visit review using our clinic review tool, if applicable. No additional management support is needed unless otherwise documented below in the visit note. 

## 2013-01-27 NOTE — Assessment & Plan Note (Signed)
Stable. Resume zoloft, pt instructed to keep upcoming apt with psychiatry.

## 2013-01-27 NOTE — Progress Notes (Signed)
Subjective:    Patient ID: [name], female    DOB: October 14, 1975, 37 y.o.   MRN: [id number]  HPI  [name] is a 37 yr old female who presents today for follow up.   Depression- ran out of zoloft x 1 week. Could not get in with psych until 12/31.  GERD- using prilosec otc. Reports it feels "like food just sits there."  Worse with greasy foods. Pain is epigastric. Reports Ice eating. Denies black/bloody stools.   Review of Systems See HPI  Past Medical History  Diagnosis Date  . Seizures   . Narcolepsy   . Restless leg   . Depression   . Hyperlipidemia   . Allergic rhinitis   . Narcolepsy   . Diabetes mellitus without complication   . Migraine     History   Social History  . Marital Status: Married    Spouse Name: N/A    Number of Children: 2  . Years of Education: N/A   Occupational History  . Unemployed- full time student     Completed 12th grade   Social History Main Topics  . Smoking status: Never Smoker   . Smokeless tobacco: Never Used  . Alcohol Use: No  . Drug Use: No  . Sexual Activity: Not on file   Other Topics Concern  . Not on file   Social History Narrative  . No narrative on file    Past Surgical History  Procedure Laterality Date  . Partial hysterectomy  2006  . Appendectomy  2006  . Tubal ligation  2002  . Appendectomy  2006  . Abdominal hysterectomy      Family History  Problem Relation Age of Onset  . Heart attack Mother   . Diabetes Father   . Heart attack Father     Allergies  Allergen Reactions  . Sulfa Antibiotics Other (See Comments)    Tongue swelling  . Eggs Or Egg-Derived Products Nausea And Vomiting  . Metoclopramide Hcl Other (See Comments)    Intensifies restless leg  . Penicillins Itching    Current Outpatient Prescriptions on File Prior to Visit  Medication Sig Dispense Refill  . lamoTRIgine (LAMICTAL) 25 MG tablet Take 4 tablets (100 mg total) twice a day.  240 tablet  1  . rOPINIRole (REQUIP) 4  MG tablet Take 1 tablet (4 mg total) by mouth at bedtime.  30 tablet  2  . metFORMIN (GLUCOPHAGE) 500 MG tablet Take 500 mg by mouth 2 (two) times daily with a meal.      . [DISCONTINUED] desvenlafaxine (PRISTIQ) 100 MG 24 hr tablet Take 100 mg by mouth daily.         No current facility-administered medications on file prior to visit.    BP 110/76  Pulse 78  Temp(Src) 98 F (36.7 C) (Oral)  Resp 16  Ht 5\' 6"  (1.676 m)  Wt 184 lb 0.6 oz (83.48 kg)  BMI 29.72 kg/m2  SpO2 99%       Objective:   Physical Exam  Constitutional: She is oriented to person, place, and time. She appears well-developed and well-nourished. No distress.  HENT:  Head: Normocephalic.  Cardiovascular: Normal rate and regular rhythm.   No murmur heard. Pulmonary/Chest: Effort normal and breath sounds normal. No respiratory distress. She has no wheezes. She has no rales. She exhibits no tenderness.  Neurological: She is alert and oriented to person, place, and time.  Skin: Skin is warm and dry.  Psychiatric: She has  a normal mood and affect. Her behavior is normal. Judgment and thought content normal.          Assessment & Plan:

## 2013-01-27 NOTE — Assessment & Plan Note (Addendum)
Deteriorated.  GB was normal on [place] over the summer.  Advised pt to increase prilosec from 20mg  to 40 mg once daily.  Obtain cbc due to ice eating.  If low will need to also complete IFOB and referral to GI.

## 2013-01-28 ENCOUNTER — Telehealth: Payer: Self-pay | Admitting: Family

## 2013-01-28 ENCOUNTER — Telehealth: Payer: Self-pay | Admitting: *Deleted

## 2013-01-28 ENCOUNTER — Encounter: Payer: Self-pay | Admitting: Family

## 2013-01-28 DIAGNOSIS — D509 Iron deficiency anemia, unspecified: Secondary | ICD-10-CM | POA: Insufficient documentation

## 2013-01-28 NOTE — Telephone Encounter (Signed)
Received call from pt requesting results of lead level.  Reports that lead was detected in her water and she wanted to be tested for lead. Per verbal from Provider, ok to add test (lead exposure). Test added per [facility]. Awaiting result.

## 2013-01-28 NOTE — Telephone Encounter (Signed)
Notified pt and she voices understanding. IFOB placed at front desk for pick up.

## 2013-01-28 NOTE — Telephone Encounter (Signed)
Lab work shows iron deficiency anemia. I would like her to add iron 325mg  twice daily. Also complete IFOB dx (anemia) please.  Follow up in February as scheduled.

## 2013-01-31 ENCOUNTER — Encounter: Payer: Self-pay | Admitting: Family

## 2013-02-02 ENCOUNTER — Telehealth: Payer: Self-pay | Admitting: Family

## 2013-02-02 NOTE — Telephone Encounter (Signed)
PATIENT WOULD LIKE LAB RESULTS

## 2013-02-02 NOTE — Telephone Encounter (Signed)
Left message on voicemail re: normal lead level per "mychart" message.

## 2013-02-23 ENCOUNTER — Telehealth: Payer: Self-pay | Admitting: *Deleted

## 2013-02-23 DIAGNOSIS — R1013 Epigastric pain: Secondary | ICD-10-CM

## 2013-02-23 NOTE — Telephone Encounter (Signed)
Received call from pt requesting status of IFOB result. States she mailed it back to the lab 1 1/2 weeks ago. She continues to have pain under her ribs when she eats and states it feels like her food is just sitting there. Advised pt I will call [facility] tomorrow for status as it looks like they have not received it yet.

## 2013-02-24 ENCOUNTER — Ambulatory Visit: Payer: Self-pay | Admitting: Neurology

## 2013-02-24 NOTE — Telephone Encounter (Signed)
Spoke with [employer], they have not received IFOB yet. She said we can give it a couple more days and check back or call pt for recollection. See pt's concern below. Please advise.

## 2013-02-24 NOTE — Telephone Encounter (Signed)
Advised pt's husband to have pt return my call.

## 2013-02-24 NOTE — Telephone Encounter (Signed)
I will refer her to GI for further evaluation

## 2013-02-25 NOTE — Telephone Encounter (Signed)
Pt returned my call and was notified of below recommendation and is agreeable to proceed with referral.

## 2013-03-01 ENCOUNTER — Encounter: Payer: Self-pay | Admitting: Neurology

## 2013-03-05 ENCOUNTER — Ambulatory Visit (INDEPENDENT_AMBULATORY_CARE_PROVIDER_SITE_OTHER): Payer: No Typology Code available for payment source | Admitting: Neurology

## 2013-03-05 ENCOUNTER — Encounter: Payer: Self-pay | Admitting: Neurology

## 2013-03-05 VITALS — BP 102/68 | HR 70 | Temp 98.4°F | Resp 18 | Ht 67.0 in | Wt 190.8 lb

## 2013-03-05 DIAGNOSIS — G40909 Epilepsy, unspecified, not intractable, without status epilepticus: Secondary | ICD-10-CM

## 2013-03-05 DIAGNOSIS — G40309 Generalized idiopathic epilepsy and epileptic syndromes, not intractable, without status epilepticus: Secondary | ICD-10-CM

## 2013-03-05 MED ORDER — LAMOTRIGINE ER 200 MG PO TB24: 200.0000 mg | ORAL_TABLET | Freq: Every day | ORAL | Status: DC

## 2013-03-05 NOTE — Patient Instructions (Signed)
1.  We will change the lamictal to Lamictal ER 200mg  tablets.  You can take 1 daily and try taking it in the morning.  We will see if you sleep better at night. 2.  Okay to resume driving 3.  Follow up in 6 months.

## 2013-03-05 NOTE — Progress Notes (Signed)
NEUROLOGY FOLLOW UP OFFICE NOTE  Indira Sorenson [id number]  HISTORY OF PRESENT ILLNESS: Ms. Durene Romans is a 38 year old female with depression, anxiety, migraine and restless leg syndrome who follows up for idiopathic generalized seizure disorder.  Records and images were personally reviewed where available.    UPDATE: Doing well.  Started school on Monday.  For a couple of months, has had trouble falling asleep.  She also has been having stomach problems after she eats.  It is noticeable if she is laying down.  She started prilosec and will be seeing GI.    She began having seizures in 2009.  There was no precipitating factor.  Her seizures present as generalized tonic-clonic.  Reportedly, she has had several seizures while asleep, as she sometimes wakes up having bit her tongue but no blood on the pillow.  No witnesses to seizure activity at night.  She usually has migraine the next day.  No prior history of seizure that she is aware.  She had been taking Keppra.  Last visit, we decided to switch from [place] to Lamictal, due to the mood changes of Keppra, as well as to achieve better seizure control.  We started Lamictal.  On 50mg  BID, the trough level was 3.5.  We then titrated the dose further to 100mg  BID.  She has not had a seizure since 08/24/12.  PAST MEDICAL HISTORY: Past Medical History  Diagnosis Date  . Seizures   . Narcolepsy   . Restless leg   . Depression   . Hyperlipidemia   . Allergic rhinitis   . Narcolepsy   . Diabetes mellitus without complication   . Migraine   . Anemia, iron deficiency 01/28/2013    MEDICATIONS: Current Outpatient Prescriptions on File Prior to Visit  Medication Sig Dispense Refill  . ferrous sulfate 325 (65 FE) MG tablet Take 325 mg by mouth 2 (two) times daily with a meal.      . omeprazole (PRILOSEC OTC) 20 MG tablet Take 40 mg by mouth daily.       Marland Kitchen rOPINIRole (REQUIP) 4 MG tablet Take 1 tablet (4 mg total) by mouth at bedtime.  30 tablet  2   . [DISCONTINUED] desvenlafaxine (PRISTIQ) 100 MG 24 hr tablet Take 100 mg by mouth daily.         No current facility-administered medications on file prior to visit.    ALLERGIES: Allergies  Allergen Reactions  . Sulfa Antibiotics Other (See Comments)    Tongue swelling  . Eggs Or Egg-Derived Products Nausea And Vomiting  . Metoclopramide Hcl Other (See Comments)    Intensifies restless leg  . Penicillins Itching    FAMILY HISTORY: Family History  Problem Relation Age of Onset  . Heart attack Mother   . Diabetes Father   . Heart attack Father     SOCIAL HISTORY: History   Social History  . Marital Status: Married    Spouse Name: N/A    Number of Children: 2  . Years of Education: N/A   Occupational History  . Unemployed- full time student     Completed 12th grade   Social History Main Topics  . Smoking status: Never Smoker   . Smokeless tobacco: Never Used  . Alcohol Use: No  . Drug Use: No  . Sexual Activity: Not on file   Other Topics Concern  . Not on file   Social History Narrative  . No narrative on file    REVIEW OF SYSTEMS:  Constitutional: No fevers, chills, or sweats, no generalized fatigue, change in appetite Eyes: No visual changes, double vision, eye pain Ear, nose and throat: No hearing loss, ear pain, nasal congestion, sore throat Cardiovascular: No chest pain, palpitations Respiratory:  No shortness of breath at rest or with exertion, wheezes GastrointestinaI: No nausea, vomiting, diarrhea, abdominal pain, fecal incontinence Genitourinary:  No dysuria, urinary retention or frequency Musculoskeletal:  No neck pain, back pain Integumentary: No rash, pruritus, skin lesions Neurological: as above Psychiatric: No depression, insomnia, anxiety Endocrine: No palpitations, fatigue, diaphoresis, mood swings, change in appetite, change in weight, increased thirst Hematologic/Lymphatic:  No anemia, purpura, petechiae. Allergic/Immunologic: no  itchy/runny eyes, nasal congestion, recent allergic reactions, rashes  PHYSICAL EXAM: Filed Vitals:   03/05/13 0755  BP: 102/68  Pulse: 70  Temp: 98.4 F (36.9 C)  Resp: 18   General: No acute distress Head:  Normocephalic/atraumatic Neck: supple, no paraspinal tenderness, full range of motion Heart:  Regular rate and rhythm Lungs:  Clear to auscultation bilaterally Back: No paraspinal tenderness Neurological Exam: alert and oriented to person, place, and time. Speech fluent and not dysarthric, language intact.  Endorses mild left facial V2 numbness.  Also notes mild left reduced hearing, which is old.   Otherwise, CN II-XII intact. Bulk and tone normal, muscle strength 5/5 throughout.  Sensation to pinprick and vibration intact.  Deep tendon reflexes 2+ throughout, toes downgoing.  Finger to nose and heel to shin testing intact.  Gait normal, Romberg negative.  IMPRESSION: Idiopathic generalized seizure disorder, controlled I have no explanation for her perceived mild subjective facial numbness.  She is otherwise okay with no objective findings, so no further testing warranted.  PLAN: 1.  Will change Lamictal 100mg  BID to Lamictal XR 200mg  every morning.  Hopefully, this will help with sleep. 2.  Since over 6 months from last seizure, okay to drive. 3.  Follow up in 6 months.    Metta Clines, DO  CC:  Debbrah Alar, NP

## 2013-03-07 ENCOUNTER — Encounter: Payer: Self-pay | Admitting: Neurology

## 2013-03-09 ENCOUNTER — Telehealth: Payer: Self-pay | Admitting: Neurology

## 2013-03-09 NOTE — Telephone Encounter (Signed)
Since her seizures have been controlled, I would recommend staying on the Lamictal 100mg  twice daily for now.  If she really wants to switch to another medication, we can try topiramate (Topamax) 50mg  tablets.  Possible side effects include: impaired thinking, sedation, paresthesias (numbness and tingling) and weight loss.  It may cause dehydration and there is a small risk for kidney stones, so make sure to stay hydrated with water during the day.  There is also a very small risk for glaucoma, so if you notice any change in your vision while taking this medication, see an ophthalmologist.  There is also a very small risk of possible suicidal ideation, as it the case with all antiepileptic medications.  She will have to remain on the Lamictal during this titration anyway.  Let me know if she wishes to try this.

## 2013-03-09 NOTE — Telephone Encounter (Signed)
Please advise on the following note

## 2013-03-09 NOTE — Telephone Encounter (Signed)
Pt would like to speak to a nurse. She states that her insurance does not pay for the meds Dr. Tomi Likens Rx her. She would like to know if there is a generic kind or possibly a different medication.

## 2013-03-10 NOTE — Telephone Encounter (Signed)
Lamictal 100mg  # 60 with 2 refills [facility] at [phone]

## 2013-03-11 ENCOUNTER — Encounter: Payer: Self-pay | Admitting: Internal Medicine

## 2013-03-13 ENCOUNTER — Ambulatory Visit: Payer: Self-pay | Admitting: Family

## 2013-03-19 ENCOUNTER — Telehealth: Payer: Self-pay | Admitting: Neurology

## 2013-03-19 NOTE — Telephone Encounter (Signed)
PATIENT NEEDS A REFILL ON THE REQUIP CALLED INTO [facility] [address] [facility]  [facility]

## 2013-03-19 NOTE — Telephone Encounter (Signed)
Requip 4 mg 1 PO at bedtime # 30 with 2 refills  [facility] [address]  [place] patient is aware

## 2013-04-10 ENCOUNTER — Ambulatory Visit: Payer: Self-pay | Admitting: Family

## 2013-04-15 ENCOUNTER — Encounter: Payer: Self-pay | Admitting: Internal Medicine

## 2013-04-15 ENCOUNTER — Ambulatory Visit (INDEPENDENT_AMBULATORY_CARE_PROVIDER_SITE_OTHER): Payer: No Typology Code available for payment source | Admitting: Internal Medicine

## 2013-04-15 VITALS — BP 110/66 | HR 76 | Ht 65.0 in | Wt 189.5 lb

## 2013-04-15 DIAGNOSIS — K5900 Constipation, unspecified: Secondary | ICD-10-CM

## 2013-04-15 DIAGNOSIS — D509 Iron deficiency anemia, unspecified: Secondary | ICD-10-CM

## 2013-04-15 DIAGNOSIS — K589 Irritable bowel syndrome without diarrhea: Secondary | ICD-10-CM

## 2013-04-15 DIAGNOSIS — K219 Gastro-esophageal reflux disease without esophagitis: Secondary | ICD-10-CM

## 2013-04-15 MED ORDER — LINACLOTIDE 290 MCG PO CAPS: 290.0000 ug | ORAL_CAPSULE | Freq: Every day | ORAL | Status: DC

## 2013-04-15 NOTE — Patient Instructions (Signed)
Start taking Linzess samples one tablet by mouth once daily in the morning. If this medication works well for you, call our office and we will send in a prescription.   You have been given information on acid reflux, weight loss.

## 2013-04-15 NOTE — Progress Notes (Signed)
HISTORY OF PRESENT ILLNESS:  [name] is a 38 y.o. female with morbid obesity, hyperlipidemia, anxiety/depression, seizure disorder, GERD, history of gastroparesis, IBS, fatty liver, and chronic headaches. She is referred today regarding a multitude of chronic GI complaints. She is accompanied by her husband. She was last seen in the office May 2011. See that dictation. Prior upper endoscopy April 2011 was normal. First, she complains of severe regurgitation, mostly at night when lying down. She does take chronic PPI for acid reflux. She remains overweight without significant change. Next, she complains of postprandial abdominal discomfort and bloating. Chronic constipation with bowel movements twice per week. No diarrhea. No bleeding. Review of outside laboratories remarkable for microcytic anemia. Hemoccult studies not completed. Patient is on iron sulfate twice daily in addition to omeprazole 20 mg daily.  REVIEW OF SYSTEMS:  All non-GI ROS negative except for depression, fatigue, sleeping problems  Past Medical History  Diagnosis Date  . Seizures   . Restless leg   . Depression   . Hyperlipidemia   . Allergic rhinitis   . Narcolepsy   . Diabetes mellitus without complication   . Migraine   . Anemia, iron deficiency 01/28/2013  . GERD (gastroesophageal reflux disease)   . IBS (irritable bowel syndrome)   . Gastroparesis   . Anxiety     Past Surgical History  Procedure Laterality Date  . Appendectomy  2006  . Tubal ligation  2002  . Abdominal hysterectomy  2006    Social History [name]  reports that she has never smoked. She has never used smokeless tobacco. She reports that she does not drink alcohol or use illicit drugs.  family history includes Cancer in her maternal uncle; Cirrhosis in her maternal aunt; Colitis in her mother; Colon polyps in her mother; Diabetes in her father; Heart attack in her father and mother; Stroke in her father.  Allergies  Allergen  Reactions  . Sulfa Antibiotics Other (See Comments)    Tongue swelling  . Eggs Or Egg-Derived Products Nausea And Vomiting  . Metoclopramide Hcl Other (See Comments)    Intensifies restless leg  . Penicillins Itching  . Reglan [Metoclopramide]        PHYSICAL EXAMINATION: Vital signs: BP 110/66  Pulse 76  Ht 5\' 5"  (1.651 m)  Wt 189 lb 8 oz (85.957 kg)  BMI 31.53 kg/m2  Constitutional: Obese, generally well-appearing, no acute distress Psychiatric: alert and oriented x3, cooperative. Flat affect Eyes: extraocular movements intact, anicteric, conjunctiva pink Mouth: oral pharynx moist, no lesions Neck: supple no lymphadenopathy Cardiovascular: heart regular rate and rhythm, no murmur Lungs: clear to auscultation bilaterally Abdomen: soft, obese, nontender, nondistended, no obvious ascites, no peritoneal signs, normal bowel sounds, no organomegaly Rectal: Omitted Extremities: no lower extremity edema bilaterally Skin: no lesions on visible extremities Neuro: No focal deficits.   ASSESSMENT:  #1. GERD. Significant regurgitant component. #2. Constipation predominant IBS #3. Microcytic anemia  PLAN:  #1. Continue PPI #2. Reflux precautions with attention to weight loss, elevation of head of the bed, and affording large and late-night meals #3. Reflux precautions sheet provided #4. Trial of Linzess 290 mcg daily. One month samples given #5. Office followup in 4 weeks. Assess response to therapies. Consider endoscopic evaluation for microcytic anemia as well as testing for celiac.

## 2013-04-27 ENCOUNTER — Ambulatory Visit: Payer: Self-pay | Admitting: Family

## 2013-05-01 ENCOUNTER — Other Ambulatory Visit: Payer: No Typology Code available for payment source

## 2013-05-01 DIAGNOSIS — D509 Iron deficiency anemia, unspecified: Secondary | ICD-10-CM

## 2013-05-01 LAB — FECAL OCCULT BLOOD, IMMUNOCHEMICAL: FECAL OCCULT BLD: POSITIVE — AB

## 2013-05-02 ENCOUNTER — Telehealth: Payer: Self-pay | Admitting: Family

## 2013-05-02 DIAGNOSIS — R195 Other fecal abnormalities: Secondary | ICD-10-CM

## 2013-05-02 NOTE — Telephone Encounter (Signed)
See my chart message

## 2013-05-04 NOTE — Telephone Encounter (Signed)
Left message with pt's husband to have pt return my call. 

## 2013-05-04 NOTE — Telephone Encounter (Signed)
Please advise 

## 2013-05-04 NOTE — Telephone Encounter (Signed)
She should make sure to take the iron supplement with food as this will help mitigate intolerances and GI upset.  Can also attempt a different formulation or iron, or even a liquid iron supplement.  The only other option is IV iron but this is reserved for people with severe iron deficiency.

## 2013-05-05 NOTE — Telephone Encounter (Signed)
Notified pt and she voices understanding. 

## 2013-05-18 ENCOUNTER — Ambulatory Visit: Payer: No Typology Code available for payment source | Admitting: Family

## 2013-05-22 ENCOUNTER — Ambulatory Visit: Payer: No Typology Code available for payment source | Admitting: Family

## 2013-05-29 ENCOUNTER — Telehealth: Payer: Self-pay | Admitting: Internal Medicine

## 2013-05-29 MED ORDER — LINACLOTIDE 290 MCG PO CAPS: 290.0000 ug | ORAL_CAPSULE | Freq: Every day | ORAL | Status: DC

## 2013-05-29 NOTE — Telephone Encounter (Signed)
Refilled Linzess 

## 2013-06-04 ENCOUNTER — Encounter: Payer: Self-pay | Admitting: Family

## 2013-06-04 ENCOUNTER — Telehealth: Payer: Self-pay

## 2013-06-04 NOTE — Telephone Encounter (Signed)
Faxed prior authorization form for Linzess to [place].  Awaiting response

## 2013-06-05 ENCOUNTER — Telehealth: Payer: Self-pay | Admitting: Internal Medicine

## 2013-06-05 NOTE — Telephone Encounter (Signed)
Received approval for Linzess for [place].  Sent approval down to be scanned

## 2013-06-05 NOTE — Telephone Encounter (Signed)
Called pharmacy to verify they had also received the authorization for Linzess that I received 06/04/13.  The Pharmacist ran the [place] through and it cleared.  Called the pt and LM that it should be ready shortly

## 2013-06-08 ENCOUNTER — Telehealth: Payer: Self-pay | Admitting: Internal Medicine

## 2013-06-08 NOTE — Telephone Encounter (Signed)
N/v and diarrhea, flu-like symptoms started on Saturday into Sunday.  Having abd pain below sternum which started today Able to eat lunch, epigastric pain started after lunch today. Taking prilosec 20 mg twice per day and Linzess.  Linzess has been helping with constipation and is new for her since Friday Having some heartburn and belching No fevers, chills that she is aware of Asked what she can take for pain. I advised that only option for evaluation tonight is ED, she voiced understanding I advised she can take APAP 1000 mg every 6 hours, not to exceed 4 grams in 24 hours. Office to contact pt tomorrow, may need to be seen by APP if not improving She will continue Prilosec 20 mg BID, GERD diet, and go to the ED if worsening.

## 2013-06-09 ENCOUNTER — Emergency Department (HOSPITAL_COMMUNITY)
Admission: EM | Admit: 2013-06-09 | Discharge: 2013-06-09 | Disposition: A | Discharge status: Home / Self Care | Payer: No Typology Code available for payment source | Attending: Emergency Medicine | Admitting: Emergency Medicine

## 2013-06-09 ENCOUNTER — Encounter (HOSPITAL_COMMUNITY): Payer: Self-pay | Admitting: Emergency Medicine

## 2013-06-09 DIAGNOSIS — E119 Type 2 diabetes mellitus without complications: Secondary | ICD-10-CM | POA: Insufficient documentation

## 2013-06-09 DIAGNOSIS — R142 Eructation: Secondary | ICD-10-CM

## 2013-06-09 DIAGNOSIS — Z79899 Other long term (current) drug therapy: Secondary | ICD-10-CM | POA: Insufficient documentation

## 2013-06-09 DIAGNOSIS — Z8709 Personal history of other diseases of the respiratory system: Secondary | ICD-10-CM | POA: Insufficient documentation

## 2013-06-09 DIAGNOSIS — Z90710 Acquired absence of both cervix and uterus: Secondary | ICD-10-CM | POA: Insufficient documentation

## 2013-06-09 DIAGNOSIS — Z9851 Tubal ligation status: Secondary | ICD-10-CM | POA: Insufficient documentation

## 2013-06-09 DIAGNOSIS — Z9889 Other specified postprocedural states: Secondary | ICD-10-CM | POA: Insufficient documentation

## 2013-06-09 DIAGNOSIS — G2581 Restless legs syndrome: Secondary | ICD-10-CM | POA: Insufficient documentation

## 2013-06-09 DIAGNOSIS — R1084 Generalized abdominal pain: Secondary | ICD-10-CM | POA: Insufficient documentation

## 2013-06-09 DIAGNOSIS — F3289 Other specified depressive episodes: Secondary | ICD-10-CM | POA: Insufficient documentation

## 2013-06-09 DIAGNOSIS — R197 Diarrhea, unspecified: Secondary | ICD-10-CM | POA: Insufficient documentation

## 2013-06-09 DIAGNOSIS — G40909 Epilepsy, unspecified, not intractable, without status epilepticus: Secondary | ICD-10-CM | POA: Insufficient documentation

## 2013-06-09 DIAGNOSIS — F329 Major depressive disorder, single episode, unspecified: Secondary | ICD-10-CM | POA: Insufficient documentation

## 2013-06-09 DIAGNOSIS — F411 Generalized anxiety disorder: Secondary | ICD-10-CM | POA: Insufficient documentation

## 2013-06-09 DIAGNOSIS — G43909 Migraine, unspecified, not intractable, without status migrainosus: Secondary | ICD-10-CM | POA: Insufficient documentation

## 2013-06-09 DIAGNOSIS — K5900 Constipation, unspecified: Secondary | ICD-10-CM | POA: Insufficient documentation

## 2013-06-09 DIAGNOSIS — R143 Flatulence: Secondary | ICD-10-CM

## 2013-06-09 DIAGNOSIS — K219 Gastro-esophageal reflux disease without esophagitis: Secondary | ICD-10-CM | POA: Insufficient documentation

## 2013-06-09 DIAGNOSIS — R109 Unspecified abdominal pain: Secondary | ICD-10-CM

## 2013-06-09 DIAGNOSIS — Z8639 Personal history of other endocrine, nutritional and metabolic disease: Secondary | ICD-10-CM | POA: Insufficient documentation

## 2013-06-09 DIAGNOSIS — Z862 Personal history of diseases of the blood and blood-forming organs and certain disorders involving the immune mechanism: Secondary | ICD-10-CM | POA: Insufficient documentation

## 2013-06-09 DIAGNOSIS — K589 Irritable bowel syndrome without diarrhea: Secondary | ICD-10-CM | POA: Insufficient documentation

## 2013-06-09 DIAGNOSIS — R1110 Vomiting, unspecified: Secondary | ICD-10-CM | POA: Insufficient documentation

## 2013-06-09 DIAGNOSIS — R141 Gas pain: Secondary | ICD-10-CM | POA: Insufficient documentation

## 2013-06-09 DIAGNOSIS — Z880 Allergy status to penicillin: Secondary | ICD-10-CM | POA: Insufficient documentation

## 2013-06-09 LAB — URINALYSIS, ROUTINE W REFLEX MICROSCOPIC
Bilirubin Urine: NEGATIVE
GLUCOSE, UA: NEGATIVE mg/dL
HGB URINE DIPSTICK: NEGATIVE
Ketones, ur: NEGATIVE mg/dL
LEUKOCYTES UA: NEGATIVE
Nitrite: NEGATIVE
PH: 5.5 (ref 5.0–8.0)
Protein, ur: NEGATIVE mg/dL
SPECIFIC GRAVITY, URINE: 1.024 (ref 1.005–1.030)
Urobilinogen, UA: 0.2 mg/dL (ref 0.0–1.0)

## 2013-06-09 LAB — CBC WITH DIFFERENTIAL/PLATELET
BASOS PCT: 0 % (ref 0–1)
Basophils Absolute: 0 10*3/uL (ref 0.0–0.1)
EOS ABS: 0.1 10*3/uL (ref 0.0–0.7)
Eosinophils Relative: 1 % (ref 0–5)
HCT: 31 % — ABNORMAL LOW (ref 36.0–46.0)
HEMOGLOBIN: 9.4 g/dL — AB (ref 12.0–15.0)
LYMPHS PCT: 15 % (ref 12–46)
Lymphs Abs: 0.9 10*3/uL (ref 0.7–4.0)
MCH: 20.5 pg — ABNORMAL LOW (ref 26.0–34.0)
MCHC: 30.3 g/dL (ref 30.0–36.0)
MCV: 67.7 fL — ABNORMAL LOW (ref 78.0–100.0)
Monocytes Absolute: 0.3 10*3/uL (ref 0.1–1.0)
Monocytes Relative: 6 % (ref 3–12)
NEUTROS PCT: 78 % — AB (ref 43–77)
Neutro Abs: 4.5 10*3/uL (ref 1.7–7.7)
PLATELETS: 258 10*3/uL (ref 150–400)
RBC: 4.58 MIL/uL (ref 3.87–5.11)
RDW: 15.7 % — ABNORMAL HIGH (ref 11.5–15.5)
WBC: 5.8 10*3/uL (ref 4.0–10.5)

## 2013-06-09 LAB — COMPREHENSIVE METABOLIC PANEL
ALBUMIN: 3.7 g/dL (ref 3.5–5.2)
ALT: 25 U/L (ref 0–35)
AST: 24 U/L (ref 0–37)
Alkaline Phosphatase: 83 U/L (ref 39–117)
BUN: 12 mg/dL (ref 6–23)
CALCIUM: 9 mg/dL (ref 8.4–10.5)
CO2: 21 meq/L (ref 19–32)
Chloride: 103 mEq/L (ref 96–112)
Creatinine, Ser: 0.76 mg/dL (ref 0.50–1.10)
GFR calc Af Amer: >90 mL/min (ref >90)
GFR calc non Af Amer: >90 mL/min (ref >90)
Glucose, Bld: 153 mg/dL — ABNORMAL HIGH (ref 70–99)
Potassium: 3.7 mEq/L (ref 3.7–5.3)
SODIUM: 136 meq/L — AB (ref 137–147)
TOTAL PROTEIN: 6.8 g/dL (ref 6.0–8.3)
Total Bilirubin: 0.6 mg/dL (ref 0.3–1.2)

## 2013-06-09 LAB — LIPASE, BLOOD: Lipase: 24 U/L (ref 11–59)

## 2013-06-09 MED ORDER — ONDANSETRON HCL 4 MG/2ML IJ SOLN
4.0000 mg | Freq: Once | INTRAMUSCULAR | Status: AC
  Administered 2013-06-09: 4 mg via INTRAVENOUS
  Filled 2013-06-09: qty 2

## 2013-06-09 MED ORDER — ONDANSETRON HCL 4 MG PO TABS: 4.0000 mg | ORAL_TABLET | Freq: Four times a day (QID) | ORAL | Status: DC

## 2013-06-09 MED ORDER — GI COCKTAIL ~~LOC~~
30.0000 mL | Freq: Once | ORAL | Status: AC
  Administered 2013-06-09: 30 mL via ORAL
  Filled 2013-06-09: qty 30

## 2013-06-09 MED ORDER — SODIUM CHLORIDE 0.9 % IV BOLUS (SEPSIS)
1000.0000 mL | Freq: Once | INTRAVENOUS | Status: AC
  Administered 2013-06-09: 1000 mL via INTRAVENOUS

## 2013-06-09 NOTE — ED Notes (Signed)
Vomiting diarrhea since Saturday; abd pain increased on Monday; mid abd;

## 2013-06-09 NOTE — Discharge Instructions (Signed)
Return to the ED with any concerns including fever/chills, vomiting and not able to keep down liquids, worsening abdomina pain especially if it localizes to the right lower abdomen, decreased level of alertness/lethargy, or any other alarming symptoms

## 2013-06-09 NOTE — ED Notes (Signed)
Pt stated pain has subsided for now. Resting comfortably.

## 2013-06-09 NOTE — ED Provider Notes (Signed)
CSN: [id number]     Arrival date & time 06/09/13  8338 History   First MD Initiated Contact with Patient 06/09/13 [id number]     Chief Complaint  Patient presents with  . Abdominal Pain     (Consider location/radiation/quality/duration/timing/severity/associated sxs/prior Treatment) HPI Pt presenting with c/o vomiting and diarrhea associated with abdominal pain.  She states symptoms began approx 3 days ago with vomiting and diarrhea.  States she had multiple loose stools and then burping which causes emesis.  Yesterday abdominal pain, sharp and cramping began.  She has had simialr symptoms in the past and has been seen by GI and started on linzess 2 months ago for constipation.  No blood or melena in stool.  No fever/chills.  Emesis is nonbloody and nonbilious.  No dysuria, no vaginal discharge, no lower abdominal pain.  There are no other associated systemic symptoms, there are no other alleviating or modifying factors.   Past Medical History  Diagnosis Date  . Seizures   . Restless leg   . Depression   . Hyperlipidemia   . Allergic rhinitis   . Narcolepsy   . Diabetes mellitus without complication   . Migraine   . Anemia, iron deficiency 01/28/2013  . GERD (gastroesophageal reflux disease)   . IBS (irritable bowel syndrome)   . Gastroparesis   . Anxiety    Past Surgical History  Procedure Laterality Date  . Appendectomy  2006  . Tubal ligation  2002  . Abdominal hysterectomy  2006   Family History  Problem Relation Age of Onset  . Heart attack Mother   . Diabetes Father   . Heart attack Father   . Colon polyps Mother   . Colitis Mother   . Cirrhosis Maternal Aunt   . Stroke Father   . Cancer Maternal Uncle     unknown type   History  Substance Use Topics  . Smoking status: Never Smoker   . Smokeless tobacco: Never Used  . Alcohol Use: No   OB History   Grav Para Term Preterm Abortions TAB SAB Ect Mult Living                 Review of Systems ROS reviewed and all  otherwise negative except for mentioned in HPI    Allergies  Sulfa antibiotics; Eggs or egg-derived products; Metoclopramide hcl; Penicillins; and Reglan  Home Medications   Prior to Admission medications   Medication Sig Start Date End Date Taking? Authorizing Provider  LamoTRIgine XR 200 MG TB24 Take 1 tablet (200 mg total) by mouth daily. 03/05/13  Yes Adam Melvern Sample, DO  Linaclotide [facility]) 290 MCG CAPS capsule Take 1 capsule (290 mcg total) by mouth daily. 04/15/13  Yes Irene Shipper, MD  omeprazole (PRILOSEC OTC) 20 MG tablet Take 40 mg by mouth daily.    Yes Historical Provider, MD  rOPINIRole (REQUIP) 4 MG tablet Take 1 tablet (4 mg total) by mouth at bedtime. 12/15/12  Yes Adam Melvern Sample, DO   BP 115/70  Pulse 72  Temp(Src) 98 F (36.7 C) (Oral)  Resp 20  SpO2 100% Vitals reviewed Physical Exam Physical Examination: General appearance - alert, well appearing, and in no distress Mental status - alert, oriented to person, place, and time Eyes - no conjunctival injection, no scleral icterus Mouth - mucous membranes moist, pharynx normal without lesions Chest - clear to auscultation, no wheezes, rales or rhonchi, symmetric air entry Heart - normal rate, regular rhythm, normal S1, S2, no murmurs,  rubs, clicks or gallops Abdomen - soft,  Mild diffuse tenderness to palpation, no rebound or gaurding, nondistended, no masses or organomegaly Extremities - peripheral pulses normal, no pedal edema, no clubbing or cyanosis Skin - normal coloration and turgor, no rashes  ED Course  Procedures (including critical care time) Labs Review Labs Reviewed  CBC WITH DIFFERENTIAL - Abnormal; Notable for the following:    Hemoglobin 9.4 (*)    HCT 31.0 (*)    MCV 67.7 (*)    MCH 20.5 (*)    RDW 15.7 (*)    Neutrophils Relative % 78 (*)    All other components within normal limits  COMPREHENSIVE METABOLIC PANEL - Abnormal; Notable for the following:    Sodium 136 (*)    Glucose,  Bld 153 (*)    All other components within normal limits  URINALYSIS, ROUTINE W REFLEX MICROSCOPIC - Abnormal; Notable for the following:    Color, Urine AMBER (*)    APPearance CLOUDY (*)    All other components within normal limits  LIPASE, BLOOD    Imaging Review No results found.   EKG Interpretation None      MDM   Final diagnoses:  Abdominal pain  Vomiting and diarrhea   Pt presenting with diffuse abdominal pain, vomiting and diarrhea.  She has had similar symptoms in the past and has seen GI for this.  Labs today are reassuring.  Abdominal exam is benign.  Pt was treated with fluids and zofran.  She feels improved and has tolerated a  Fluid challenge.  Discharged with strict return precautions.  Pt agreeable with plan.    Threasa Beards, MD 06/09/13 [phone]

## 2013-06-09 NOTE — Telephone Encounter (Signed)
Call placed to pt. Left messages on home and cell phone to call back. Per epic pt went to [facility] ER this am.

## 2013-06-11 ENCOUNTER — Encounter: Payer: Self-pay | Admitting: Nurse Practitioner

## 2013-06-11 ENCOUNTER — Ambulatory Visit (INDEPENDENT_AMBULATORY_CARE_PROVIDER_SITE_OTHER): Payer: No Typology Code available for payment source | Admitting: Nurse Practitioner

## 2013-06-11 ENCOUNTER — Other Ambulatory Visit (INDEPENDENT_AMBULATORY_CARE_PROVIDER_SITE_OTHER): Payer: No Typology Code available for payment source

## 2013-06-11 VITALS — BP 110/74 | HR 66 | Ht 67.0 in | Wt 187.7 lb

## 2013-06-11 DIAGNOSIS — K3184 Gastroparesis: Secondary | ICD-10-CM

## 2013-06-11 DIAGNOSIS — K589 Irritable bowel syndrome without diarrhea: Secondary | ICD-10-CM

## 2013-06-11 DIAGNOSIS — D509 Iron deficiency anemia, unspecified: Secondary | ICD-10-CM

## 2013-06-11 LAB — IRON AND TIBC
%SAT: 4 % — AB (ref 20–55)
IRON: 16 ug/dL — AB (ref 42–145)
TIBC: 429 ug/dL (ref 250–470)
UIBC: 413 ug/dL — ABNORMAL HIGH (ref 125–400)

## 2013-06-11 LAB — FERRITIN: FERRITIN: 4.5 ng/mL — AB (ref 10.0–291.0)

## 2013-06-11 MED ORDER — ONDANSETRON HCL 4 MG PO TABS: 4.0000 mg | ORAL_TABLET | Freq: Four times a day (QID) | ORAL | Status: DC

## 2013-06-11 MED ORDER — MOVIPREP 100 G PO SOLR: 1.0000 | Freq: Once | ORAL | Status: DC

## 2013-06-11 NOTE — Patient Instructions (Addendum)
You have been scheduled for an endoscopy and colonoscopy with propofol. Please follow the written instructions given to you at your visit today. Please pick up your prep at the pharmacy within the next 1-3 days. If you use inhalers (even only as needed), please bring them with you on the day of your procedure. Your physician has requested that you go to www.startemmi.com and enter the access code given to you at your visit today. This web site gives a general overview about your procedure. However, you should still follow specific instructions given to you by our office regarding your preparation for the procedure.  Your physician has requested that you go to the basement for the following lab work before leaving today: Ferritin  TIBC  Bivalve as needed   Refill of Zofran was sent to your pharmacy  Eat small frequent meals   Information on a Gastroparesis Diet is below for your review _________________________________________________________________________________________________________________  Gastroparesis  Gastroparesis is also called slowed stomach emptying (delayed gastric emptying). It is a condition in which the stomach takes too long to empty its contents. It often happens in people with diabetes.  CAUSES  Gastroparesis happens when nerves to the stomach are damaged or stop working. When the nerves are damaged, the muscles of the stomach and intestines do not work normally. The movement of food is slowed or stopped. High blood glucose (sugar) causes changes in nerves and can damage the blood vessels that carry oxygen and nutrients to the nerves. RISK FACTORS  Diabetes.  Post-viral syndromes.  Eating disorders (anorexia, bulimia).  Surgery on the stomach or vagus nerve.  Gastroesophageal reflux disease (rarely).  Smooth muscle disorders (amyloidosis, scleroderma).  Metabolic disorders, including hypothyroidism.  Parkinson disease. SYMPTOMS    Heartburn.  Feeling sick to your stomach (nausea).  Vomiting of undigested food.  An early feeling of fullness when eating.  Weight loss.  Abdominal bloating.  Erratic blood glucose levels.  Lack of appetite.  Gastroesophageal reflux.  Spasms of the stomach wall. Complications can include:  Bacterial overgrowth in stomach. Food stays in the stomach and can ferment and cause bacteria to grow.  Weight loss due to difficulty digesting and absorbing nutrients.  Vomiting.  Obstruction in the stomach. Undigested food can harden and cause nausea and vomiting.  Blood glucose fluctuations caused by inconsistent food absorption. DIAGNOSIS  The diagnosis of gastroparesis is confirmed through one or more of the following tests:  Barium X-rays and scans. These tests look at how long it takes for food to move through the stomach.  Gastric manometry. This test measures electrical and muscular activity in the stomach. A thin tube is passed down the throat into the stomach. The tube contains a wire that takes measurements of the stomach's electrical and muscular activity as it digests liquids and solid food.  Endoscopy. This procedure is done with a long, thin tube called an endoscope. It is passed through the mouth and gently down the esophagus into the stomach. This tube helps the caregiver look at the lining of the stomach to check for any abnormalities.  Ultrasonography. This can rule out gallbladder disease or pancreatitis. This test will outline and define the shape of the gallbladder and pancreas. TREATMENT   Treatments may include:  Exercise.  Medicines to control nausea and vomiting.  Medicines to stimulate stomach muscles.  Changes in what and when you eat.  Having smaller meals more often.  Eating low-fiber forms of high-fiber foods, such as eating cooked vegetables instead of raw  vegetables.  Eating low-fat foods.  Consuming liquids, which are easier to  digest.  In severe cases, feeding tubes and intravenous (IV) feeding may be needed. It is important to note that in most cases, treatment does not cure gastroparesis. It is usually a lasting (chronic) condition. Treatment helps you manage the underlying condition so that you can be as healthy and comfortable as possible. Other treatments  A gastric neurostimulator has been developed to assist people with gastroparesis. The battery-operated device is surgically implanted. It emits mild electrical pulses to help improve stomach emptying and to control nausea and vomiting.  The use of botulinum toxin has been shown to improve stomach emptying by decreasing the prolonged contractions of the muscle between the stomach and the small intestine (pyloric sphincter). The benefits are temporary. SEEK MEDICAL CARE IF:   You have diabetes and you are having problems keeping your blood glucose in goal range.  You are having nausea, vomiting, bloating, or early feelings of fullness with eating.  Your symptoms do not change with a change in diet. Document Released: 01/22/2005 Document Revised: 05/19/2012 Document Reviewed: 07/01/2008 [facility] Patient Information 2014 [place], [place].

## 2013-06-11 NOTE — Progress Notes (Signed)
     History of Present Illness:   Patient is a 38 year old female known to [name] for chronic intermittent nausea and vomiting, bloating and postprandial abdominal discomfort. She carries a diagnosis of gastroparesis. Patient was seen [name] in March for constipation at which time Linzess samples were given. Linzess worked well for a month then she ran out of samples and was off the medication for a week while awaiting prior authorization. After restarting Linzess last week patient developed sour belches followed by upper abdominal pain, nausea and vomiting. She went to ED Tuesday morning with progressive epigastric pain.  After IVF and Zofran she felt better and was discharged home. ED labs remarkable only for hgb of 9.4, MCV of 67. Of note, hgb in Dec 2014 was 10.1. Last Linzess was on Monday, only a couple of loose stools since holding the medication. No recent antibiotics.    Current Medications, Allergies, Past Medical History, Past Surgical History, Family History and Social History were reviewed in [employer] record.  Physical Exam: General: Pleasant, white female in no acute distress Head: Normocephalic and atraumatic Eyes:  sclerae anicteric, conjunctiva pink  Ears: Normal auditory acuity Lungs: Clear throughout to auscultation Heart: Regular rate and rhythm Abdomen: Soft, non distended, non-tender. No masses, no hepatomegaly. Normal bowel sounds Musculoskeletal: Symmetrical with no gross deformities  Extremities: No edema  Neurological: Alert oriented x 4, grossly nonfocal Psychological:  Alert and cooperative. Normal mood and affect  Assessment and Recommendations:  76. 38 year old female with chronic, intermittent nausea / vomiting, bloating, postprandial abdominal discomfort and history of gastroparesis. Over the weekend she developed recurrent symptoms, this time associated with loose stool and diffuse abdominal pain. It should be noted that patient  had taken Linzess which could explain the diarrhea. Symptoms could be combination of things (Linzess, gastroenteritis, gastroparesis).   Nausea and vomiting improved on Zofran which I will refill.   Only a couple of loose stools since holding Linzess on Monday. I have asked her to take Linzess only as needed for now.   2. Microcytic anemia, progressive.  LMP 6 years ago. Hgb 12.1 in Aug 2014, then 10.05 Jan 2013, down to 9.4 now with MCV of 67.  She endorses "dark" but not black stools a couple of months back.    For further evaluation patient will be scheduled for EGD.The risks, benefits, and alternatives to EGD with possible biopsies were discussed with the patient and she agrees to proceed.  Patient had a positive IFOB late March so colonoscopy will be done as well.  The risks, benefits, and alternatives to colonoscopy with possible biopsy and possible polypectomy were discussed with the patient and she consents to proceed.   Patient is not on oral iron. Will check iron studies today.   3. Multiple medical problems: diabetes (off meds since she managed to lose weight. Hx of hyperlipidemia, seizures, anxiety / depression / restless leg, migraines, obesity, fatty liver.

## 2013-06-11 NOTE — Progress Notes (Signed)
Reviewed. Agree with initial assessment and plans as outlined

## 2013-06-17 ENCOUNTER — Other Ambulatory Visit: Payer: Self-pay | Admitting: *Deleted

## 2013-06-17 MED ORDER — ROPINIROLE HCL 4 MG PO TABS: 4.0000 mg | ORAL_TABLET | Freq: Every day | ORAL | Status: DC

## 2013-07-07 HISTORY — PX: ESOPHAGOGASTRODUODENOSCOPY: SHX1529

## 2013-07-07 HISTORY — PX: COLONOSCOPY WITH PROPOFOL: SHX5780

## 2013-07-17 ENCOUNTER — Other Ambulatory Visit: Payer: Self-pay | Admitting: *Deleted

## 2013-07-17 ENCOUNTER — Encounter: Payer: Self-pay | Admitting: Internal Medicine

## 2013-07-17 ENCOUNTER — Telehealth: Payer: Self-pay | Admitting: *Deleted

## 2013-07-17 ENCOUNTER — Ambulatory Visit (AMBULATORY_SURGERY_CENTER): Payer: No Typology Code available for payment source | Admitting: Internal Medicine

## 2013-07-17 VITALS — BP 105/76 | HR 64 | Temp 96.9°F | Resp 25 | Ht 67.0 in | Wt 187.0 lb

## 2013-07-17 DIAGNOSIS — D509 Iron deficiency anemia, unspecified: Secondary | ICD-10-CM

## 2013-07-17 DIAGNOSIS — K589 Irritable bowel syndrome without diarrhea: Secondary | ICD-10-CM

## 2013-07-17 DIAGNOSIS — K219 Gastro-esophageal reflux disease without esophagitis: Secondary | ICD-10-CM

## 2013-07-17 DIAGNOSIS — K6389 Other specified diseases of intestine: Secondary | ICD-10-CM

## 2013-07-17 DIAGNOSIS — C189 Malignant neoplasm of colon, unspecified: Secondary | ICD-10-CM

## 2013-07-17 DIAGNOSIS — K3184 Gastroparesis: Secondary | ICD-10-CM

## 2013-07-17 MED ORDER — SODIUM CHLORIDE 0.9 % IV SOLN: 500.0000 mL | INTRAVENOUS | Status: DC

## 2013-07-17 NOTE — Progress Notes (Signed)
Called to room to assist during endoscopic procedure.  Patient ID and intended procedure confirmed with present staff. Received instructions for my participation in the procedure from the performing physician.  

## 2013-07-17 NOTE — Telephone Encounter (Signed)
Scheduled CT of ABD, Pelvis with contrast as per procedure note at [facility]) on 07/21/13 at 11:30 AM. Contrast at 9:30 And 10:30 AM. NPO 4 hours prior. Abigail Butts at [facility] recovery notified. Referral in [facility] for general surgery consultation.Spoke with Sonya at [facility] and they will work on scheduling OV.

## 2013-07-17 NOTE — Patient Instructions (Signed)
YOU HAD AN ENDOSCOPIC PROCEDURE TODAY AT THE [facility]: Refer to the procedure report that was given to you for any specific questions about what was found during the examination.  If the procedure report does not answer your questions, please call your gastroenterologist to clarify.  If you requested that your care partner not be given the details of your procedure findings, then the procedure report has been included in a sealed envelope for you to review at your convenience later.  YOU SHOULD EXPECT: Some feelings of bloating in the abdomen. Passage of more gas than usual.  Walking can help get rid of the air that was put into your GI tract during the procedure and reduce the bloating. If you had a lower endoscopy (such as a colonoscopy or flexible sigmoidoscopy) you may notice spotting of blood in your stool or on the toilet paper. If you underwent a bowel prep for your procedure, then you may not have a normal bowel movement for a few days.  DIET: Your first meal following the procedure should be a light meal and then it is ok to progress to your normal diet.  A half-sandwich or bowl of soup is an example of a good first meal.  Heavy or fried foods are harder to digest and may make you feel nauseous or bloated.  Likewise meals heavy in dairy and vegetables can cause extra gas to form and this can also increase the bloating.  Drink plenty of fluids but you should avoid alcoholic beverages for 24 hours.  ACTIVITY: Your care partner should take you home directly after the procedure.  You should plan to take it easy, moving slowly for the rest of the day.  You can resume normal activity the day after the procedure however you should NOT DRIVE or use heavy machinery for 24 hours (because of the sedation medicines used during the test).    SYMPTOMS TO REPORT IMMEDIATELY: A gastroenterologist can be reached at any hour.  During normal business hours, 8:30 AM to 5:00 PM Monday through Friday,  call [phone].  After hours and on weekends, please call the GI answering service at [phone] who will take a message and have the physician on call contact you.   Following lower endoscopy (colonoscopy or flexible sigmoidoscopy):  Excessive amounts of blood in the stool  Significant tenderness or worsening of abdominal pains  Swelling of the abdomen that is new, acute  Fever of 100F or higher  Following upper endoscopy (EGD)  Vomiting of blood or coffee ground material  New chest pain or pain under the shoulder blades  Painful or persistently difficult swallowing  New shortness of breath  Fever of 100F or higher  Black, tarry-looking stools  FOLLOW UP: If any biopsies were taken you will be contacted by phone or by letter within the next 1-3 weeks.  Call your gastroenterologist if you have not heard about the biopsies in 3 weeks.  Our staff will call the home number listed on your records the next business day following your procedure to check on you and address any questions or concerns that you may have at that time regarding the information given to you following your procedure. This is a courtesy call and so if there is no answer at the home number and we have not heard from you through the emergency physician on call, we will assume that you have returned to your regular daily activities without incident.  SIGNATURES/CONFIDENTIALITY: You and/or your care   partner have signed paperwork which will be entered into your electronic medical record.  These signatures attest to the fact that that the information above on your After Visit Summary has been reviewed and is understood.  Full responsibility of the confidentiality of this discharge information lies with you and/or your care-partner.  Recommendations Await biopsy results Contrast enhanced CT scan of abdomen and pelvis General surgical consultation

## 2013-07-17 NOTE — Progress Notes (Signed)
Advised patient and family member CT scheduled for Tuesday, June 16th at 11:30am. Advised to arrive 15 minutes early and gave contrast with instructions and directions to [place] CT. Also, advised nothing to eat or drink 4 hours prior to exam.

## 2013-07-17 NOTE — Progress Notes (Signed)
Report to PACU, RN, vss, BBS= Clear.  

## 2013-07-17 NOTE — Op Note (Signed)
[place]  [address] [place], [id number]   ENDOSCOPY PROCEDURE REPORT  PATIENT: Emily, [name]  MR#: [id number] BIRTHDATE: 1975-02-08 , 37  yrs. old GENDER: Female ENDOSCOPIST: Eustace Quail, MD REFERRED BY:  Debbrah Alar, FNP PROCEDURE DATE:  07/17/2013 PROCEDURE:  EGD, diagnostic ASA CLASS:     Class II INDICATIONS:  Iron deficiency anemia.   Nausea, occasional vomiting, postprandial abdominal discomfort. MEDICATIONS: MAC sedation, administered by CRNA and propofol (Diprivan) 120mg  IV TOPICAL ANESTHETIC: none  DESCRIPTION OF PROCEDURE: After the risks benefits and alternatives of the procedure were thoroughly explained, informed consent was obtained.  The LB [id number] endoscope was introduced through the mouth and advanced to the second portion of the duodenum. Without limitations.  The instrument was slowly withdrawn as the mucosa was fully examined.      EXAM:  The upper, middle and distal third of the esophagus were carefully inspected and no abnormalities were noted.  The z-line was well seen at the GEJ.  The endoscope was pushed into the fundus which was normal including a retroflexed view.  The antrum, gastric body, first and second part of the duodenum were unremarkable. Retroflexed views revealed no abnormalities.     The scope was then withdrawn from the patient and the procedure completed.  COMPLICATIONS: There were no complications.  ENDOSCOPIC IMPRESSION: 1. Normal EGD  RECOMMENDATIONS: 1. See colonoscopy report  REPEAT EXAM:  eSigned:  Eustace Quail, MD 07/17/2013 2:41 PM   CC:O'Sullivan, Lenna Sciara FNP and The Patient

## 2013-07-17 NOTE — Op Note (Signed)
[place]  [address] [place], [id number]   COLONOSCOPY PROCEDURE REPORT  PATIENT: [name], [name]  MR#: [id number] BIRTHDATE: 08-15-1975 , 37  yrs. old GENDER: Female ENDOSCOPIST: Eustace Quail, MD REFERRED JM:EQASTMH O' Sullivan, N.P. PROCEDURE DATE:  07/17/2013 PROCEDURE:   Colonoscopy with biopsy First Screening Colonoscopy - Avg.  risk and is 50 yrs.  old or older - No.  Prior Negative Screening - Now for repeat screening. N/A  History of Adenoma - Now for follow-up colonoscopy & has been > or = to 3 yrs.  N/A  Polyps Removed Today? Yes. ASA CLASS:   Class II INDICATIONS:Iron Deficiency Anemia. MEDICATIONS: MAC sedation, administered by CRNA and propofol (Diprivan) 480mg  IV  DESCRIPTION OF PROCEDURE:   After the risks benefits and alternatives of the procedure were thoroughly explained, informed consent was obtained.  A digital rectal exam revealed no abnormalities of the rectum.   The LB [id number]  endoscope was introduced through the anus and advanced to the proximal transverse colon. No adverse events experienced.   The quality of the prep was excellent, using MoviPrep  The instrument was then slowly withdrawn as the colon was fully examined.  COLON FINDINGS: A circumferential malignant appearing mass was found in proximal half of the colon, probably the transverse colon. Could not advance the colonoscope beyond the lesion. Biopsies taken. Marking tattoos were placed 3 cm distal to the lesion. As well, and endoscopic clip was placed in that same area, for marking purposes. The colon mucosa was otherwise normal.  Retroflexed views revealed no abnormalities. The time to cecum=minutes 0 seconds. Withdrawal time=minutes 0 seconds.  The scope was withdrawn and the procedure completed. COMPLICATIONS: There were no complications.  ENDOSCOPIC IMPRESSION: 1.   A Mass was found in the proximal half of the colon, possibly transverse colon.  Unable to pass endoscope beyond 2.   The colon mucosa was otherwise normal  RECOMMENDATIONS: 1.  Await biopsy results 2.  My office will arrange for you to have a contrast-enhanced CT scan of abdomen and pelvis  "Colon Mass.". 3.  General surgical consultation "colon Mass., for resection".   eSigned:  Eustace Quail, MD 07/17/2013 2:37 PM   cc: Debbrah Alar, Geradine Girt and The Patient

## 2013-07-20 ENCOUNTER — Telehealth: Payer: Self-pay | Admitting: *Deleted

## 2013-07-20 NOTE — Telephone Encounter (Signed)
  Follow up Call-  Call back number 07/17/2013  Post procedure Call Back phone  # [phone]  Permission to leave phone message Yes     Patient questions:  Do you have a fever, pain , or abdominal swelling? no Pain Score  0 *  Have you tolerated food without any problems? yes  Have you been able to return to your normal activities? yes  Do you have any questions about your discharge instructions: Diet   no Medications  no Follow up visit  no  Do you have questions or concerns about your Care? no  Actions: * If pain score is 4 or above: No action needed, pain <4.

## 2013-07-20 NOTE — Telephone Encounter (Signed)
Per computer pt is scheduled to see Dr. Barry Dienes 08/03/13@9am . Pt should arrive there at 8:30am. Left message for pt to call back.   Left message for pt to call back 07/21/13.

## 2013-07-21 ENCOUNTER — Ambulatory Visit (INDEPENDENT_AMBULATORY_CARE_PROVIDER_SITE_OTHER)
Admission: RE | Admit: 2013-07-21 | Discharge: 2013-07-21 | Disposition: A | Discharge status: Home / Self Care | Payer: No Typology Code available for payment source | Source: Ambulatory Visit | Attending: Internal Medicine | Admitting: Internal Medicine

## 2013-07-21 DIAGNOSIS — K6389 Other specified diseases of intestine: Secondary | ICD-10-CM

## 2013-07-21 MED ORDER — IOHEXOL 300 MG/ML  SOLN
80.0000 mL | Freq: Once | INTRAMUSCULAR | Status: AC | PRN
  Administered 2013-07-21: 80 mL via INTRAVENOUS

## 2013-07-21 NOTE — Telephone Encounter (Signed)
Spoke with pt and she is aware of appt. 

## 2013-07-26 ENCOUNTER — Encounter: Payer: Self-pay | Admitting: Internal Medicine

## 2013-07-26 ENCOUNTER — Encounter: Payer: Self-pay | Admitting: Family

## 2013-07-27 ENCOUNTER — Encounter (INDEPENDENT_AMBULATORY_CARE_PROVIDER_SITE_OTHER): Payer: Self-pay | Admitting: General Surgery

## 2013-07-27 NOTE — Telephone Encounter (Signed)
[name] should the pt contact her PCP regarding restless leg? Please advise.

## 2013-08-03 ENCOUNTER — Ambulatory Visit (INDEPENDENT_AMBULATORY_CARE_PROVIDER_SITE_OTHER): Payer: No Typology Code available for payment source | Admitting: General Surgery

## 2013-08-05 ENCOUNTER — Ambulatory Visit (INDEPENDENT_AMBULATORY_CARE_PROVIDER_SITE_OTHER): Payer: No Typology Code available for payment source | Admitting: General Surgery

## 2013-08-05 ENCOUNTER — Other Ambulatory Visit (INDEPENDENT_AMBULATORY_CARE_PROVIDER_SITE_OTHER): Payer: Self-pay

## 2013-08-05 ENCOUNTER — Encounter (INDEPENDENT_AMBULATORY_CARE_PROVIDER_SITE_OTHER): Payer: Self-pay | Admitting: General Surgery

## 2013-08-05 ENCOUNTER — Telehealth (INDEPENDENT_AMBULATORY_CARE_PROVIDER_SITE_OTHER): Payer: Self-pay

## 2013-08-05 VITALS — BP 126/68 | HR 70 | Resp 14 | Ht 66.0 in | Wt 182.0 lb

## 2013-08-05 DIAGNOSIS — C182 Malignant neoplasm of ascending colon: Secondary | ICD-10-CM | POA: Insufficient documentation

## 2013-08-05 DIAGNOSIS — C189 Malignant neoplasm of colon, unspecified: Secondary | ICD-10-CM

## 2013-08-05 HISTORY — DX: Malignant neoplasm of colon, unspecified: C18.9

## 2013-08-05 NOTE — Telephone Encounter (Signed)
Called patient back regarding her pre op instructions. Advised she will take the abx the day before surgery; not today.

## 2013-08-05 NOTE — Progress Notes (Signed)
Chief complaint:  New diagnosis of colon cancer  HISTORY: Patient is a 38 year old female has been having around 4-5 years of GI difficulty. She is referred in consultation by Dr. Scarlette Shorts for evaluation of her colon cancer.  Her Gi issues have gotten dramatically worse over the past 6 months. She has started having blood in her stool and significant constipation. She has upper abdominal pain and difficulty in. He has lost around 17 pounds. She describes having PICA 2 is. She states when she she felt like things are at the top of her abdomen. She did not have diffuse bloating but does have bloating in her upper abdomen. She also feels quite fatigued.  She ended up getting a colonoscopy due to the increasing difficulties with constipation. She was found to have a circumferential mass that was partially obstructing and her proximal colon.  Past Medical History  Diagnosis Date  . Seizures   . Restless leg   . Depression   . Hyperlipidemia   . Allergic rhinitis   . Narcolepsy   . Migraine   . Anemia, iron deficiency 01/28/2013  . GERD (gastroesophageal reflux disease)   . IBS (irritable bowel syndrome)   . Anxiety   . Gastroparesis   . Allergy     Past Surgical History  Procedure Laterality Date  . Appendectomy  2006  . Tubal ligation  2002  . Abdominal hysterectomy  2006  . Esophagogastroduodenoscopy  2011    normal    Current Outpatient Prescriptions  Medication Sig Dispense Refill  . lamoTRIgine (LAMICTAL) 100 MG tablet Take 100 mg by mouth daily.      Marland Kitchen omeprazole (PRILOSEC OTC) 20 MG tablet Take 40 mg by mouth daily.       . ondansetron (ZOFRAN) 4 MG tablet Take 1 tablet (4 mg total) by mouth every 6 (six) hours.  30 tablet  0  . rOPINIRole (REQUIP) 4 MG tablet Take 1 tablet (4 mg total) by mouth at bedtime.  30 tablet  2  . [DISCONTINUED] desvenlafaxine (PRISTIQ) 100 MG 24 hr tablet Take 100 mg by mouth daily.         No current facility-administered medications for this  visit.     Allergies  Allergen Reactions  . Sulfa Antibiotics Other (See Comments)    Tongue swelling  . Eggs Or Egg-Derived Products Nausea And Vomiting  . Metoclopramide Hcl Other (See Comments)    Intensifies restless leg  . Penicillins Itching  . Reglan [Metoclopramide]     Irritates restless legs     Family History  Problem Relation Age of Onset  . Heart attack Mother   . Colon polyps Mother   . Colitis Mother   . Diabetes Father   . Heart attack Father   . Stroke Father   . Cirrhosis Maternal Aunt   . Cancer Maternal Uncle     unknown type  . Colon cancer Neg Hx   . Esophageal cancer Neg Hx   . Rectal cancer Neg Hx   . Stomach cancer Neg Hx      History   Social History  . Marital Status: Married    Spouse Name: N/A    Number of Children: 2  . Years of Education: N/A   Occupational History  . Unemployed- full time student     Completed 12th grade   Social History Main Topics  . Smoking status: Never Smoker   . Smokeless tobacco: Never Used  . Alcohol Use: No  .  Drug Use: No  . Sexual Activity: None   Other Topics Concern  . None   Social History Narrative  . None     REVIEW OF SYSTEMS - PERTINENT POSITIVES ONLY: 12 point review of systems negative other than HPI and PMH   EXAM: Filed Vitals:   08/05/13 0901  BP: 126/68  Pulse: 70  Resp: 14    Wt Readings from Last 3 Encounters:  08/05/13 182 lb (82.555 kg)  07/17/13 187 lb (84.823 kg)  06/11/13 187 lb 11.2 oz (85.14 kg)     Gen:  No acute distress.  Well nourished and well groomed.   Neurological: Alert and oriented to person, place, and time. Coordination normal.  Head: Normocephalic and atraumatic.  Eyes: Conjunctivae are normal. Pupils are equal, round, and reactive to light. No scleral icterus.  Neck: Normal range of motion. Neck supple. No tracheal deviation or thyromegaly present.  Cardiovascular: Normal rate, regular rhythm, normal heart sounds and intact distal pulses.   Exam reveals no gallop and no friction rub.  No murmur heard. Respiratory: Effort normal.  No respiratory distress. No chest wall tenderness. Breath sounds normal.  No wheezes, rales or rhonchi.  GI: Soft. Bowel sounds are normal. The abdomen is soft and nontender.  There is no rebound and no guarding.  Musculoskeletal: Normal range of motion. Extremities are nontender.  Lymphadenopathy: No cervical, preauricular, postauricular or axillary adenopathy is present Skin: Skin is warm and dry. No rash noted. No diaphoresis. No erythema. No pallor. No clubbing, cyanosis, or edema.   Psychiatric: Normal mood and affect. Behavior is normal. Judgment and thought content normal.    LABORATORY RESULTS: Available labs are reviewed   Recent Results (from the past 2160 hour(s))  CBC WITH DIFFERENTIAL     Status: Abnormal   Collection Time    06/09/13  9:35 AM      Result Value Ref Range   WBC 5.8  4.0 - 10.5 K/uL   RBC 4.58  3.87 - 5.11 MIL/uL   Hemoglobin 9.4 (*) 12.0 - 15.0 g/dL   HCT 31.0 (*) 36.0 - 46.0 %   MCV 67.7 (*) 78.0 - 100.0 fL   MCH 20.5 (*) 26.0 - 34.0 pg   MCHC 30.3  30.0 - 36.0 g/dL   RDW 15.7 (*) 11.5 - 15.5 %   Platelets 258  150 - 400 K/uL   Neutrophils Relative % 78 (*) 43 - 77 %   Lymphocytes Relative 15  12 - 46 %   Monocytes Relative 6  3 - 12 %   Eosinophils Relative 1  0 - 5 %   Basophils Relative 0  0 - 1 %   Neutro Abs 4.5  1.7 - 7.7 K/uL   Lymphs Abs 0.9  0.7 - 4.0 K/uL   Monocytes Absolute 0.3  0.1 - 1.0 K/uL   Eosinophils Absolute 0.1  0.0 - 0.7 K/uL   Basophils Absolute 0.0  0.0 - 0.1 K/uL   RBC Morphology POLYCHROMASIA PRESENT     Smear Review LARGE PLATELETS PRESENT    COMPREHENSIVE METABOLIC PANEL     Status: Abnormal   Collection Time    06/09/13  9:35 AM      Result Value Ref Range   Sodium 136 (*) 137 - 147 mEq/L   Potassium 3.7  3.7 - 5.3 mEq/L   Chloride 103  96 - 112 mEq/L   CO2 21  19 - 32 mEq/L   Glucose, Bld 153 (*) 70 -  99 mg/dL   BUN 12   6 - 23 mg/dL   Creatinine, Ser 0.76  0.50 - 1.10 mg/dL   Calcium 9.0  8.4 - 10.5 mg/dL   Total Protein 6.8  6.0 - 8.3 g/dL   Albumin 3.7  3.5 - 5.2 g/dL   AST 24  0 - 37 U/L   ALT 25  0 - 35 U/L   Alkaline Phosphatase 83  39 - 117 U/L   Total Bilirubin 0.6  0.3 - 1.2 mg/dL   GFR calc non Af Amer >90  >90 mL/min   GFR calc Af Amer >90  >90 mL/min   Comment: (NOTE)     The eGFR has been calculated using the CKD EPI equation.     This calculation has not been validated in all clinical situations.     eGFR's persistently <90 mL/min signify possible Chronic Kidney     Disease.  LIPASE, BLOOD     Status: None   Collection Time    06/09/13  9:35 AM      Result Value Ref Range   Lipase 24  11 - 59 U/L  URINALYSIS, ROUTINE W REFLEX MICROSCOPIC     Status: Abnormal   Collection Time    06/09/13 10:45 AM      Result Value Ref Range   Color, Urine AMBER (*) YELLOW   Comment: BIOCHEMICALS MAY BE AFFECTED BY COLOR   APPearance CLOUDY (*) CLEAR   Specific Gravity, Urine 1.024  1.005 - 1.030   pH 5.5  5.0 - 8.0   Glucose, UA NEGATIVE  NEGATIVE mg/dL   Hgb urine dipstick NEGATIVE  NEGATIVE   Bilirubin Urine NEGATIVE  NEGATIVE   Ketones, ur NEGATIVE  NEGATIVE mg/dL   Protein, ur NEGATIVE  NEGATIVE mg/dL   Urobilinogen, UA 0.2  0.0 - 1.0 mg/dL   Nitrite NEGATIVE  NEGATIVE   Leukocytes, UA NEGATIVE  NEGATIVE   Comment: MICROSCOPIC NOT DONE ON URINES WITH NEGATIVE PROTEIN, BLOOD, LEUKOCYTES, NITRITE, OR GLUCOSE <1000 mg/dL.  FERRITIN     Status: Abnormal   Collection Time    06/11/13 10:08 AM      Result Value Ref Range   Ferritin 4.5 (*) 10.0 - 291.0 ng/mL  IRON AND TIBC     Status: Abnormal   Collection Time    06/11/13 10:08 AM      Result Value Ref Range   Iron 16 (*) 42 - 145 ug/dL   UIBC 413 (*) 125 - 400 ug/dL   TIBC 429  250 - 470 ug/dL   %SAT 4 (*) 20 - 55 %     RADIOLOGY RESULTS: See E-Chart or I-Site for most recent results.  Images and reports are reviewed.  Ct  Abdomen Pelvis W Contrast  07/21/2013   CLINICAL DATA:  Iron deficiency anemia. Recent colonic mass diagnosed but colonoscopy.  EXAM: CT ABDOMEN AND PELVIS WITH CONTRAST  TECHNIQUE: Multidetector CT imaging of the abdomen and pelvis was performed using the standard protocol following bolus administration of intravenous contrast.  CONTRAST:  46m OMNIPAQUE IOHEXOL 300 MG/ML  SOLN  COMPARISON:  None.  FINDINGS: Short-segment of annular wall thickening is seen involving the ascending colon, suspicious colon carcinoma. No evidence of bowel obstruction. Mild mesenteric lymphadenopathy is seen in the right abdomen medial to this mass, with largest lymph node measuring 12 mm on image 45. This is suspicious for local nodal metastases.  No lymphadenopathy seen elsewhere within the abdomen or pelvis. No liver masses are  identified. The gallbladder, pancreas, spleen, adrenal glands, and kidneys are normal in appearance. No evidence hydronephrosis.  No evidence of inflammatory process or abnormal fluid collections. Prior hysterectomy noted. Right ovary visualized and no adnexal masses identified.  IMPRESSION: Short-segment annular wall thickening involving the ascending colon, highly suspicious for primary colon carcinoma.  Mild right abdominal mesenteric lymphadenopathy, consistent with nodal metastases.  No evidence of distant metastatic disease within the abdomen or pelvis.   Electronically Signed   By: Earle Gell M.D.   On: 07/21/2013 15:56      ASSESSMENT AND PLAN: Colon cancer Patient has a new diagnosis of colon cancer. This appears to be in the proximal half of the colon based on endoscopy, but Dr Henrene Pastor was not able to traverse the lesion. I advised the patient that we might have to slightly alter the portion of the colon that we take out depending on the location of the tattoos.  Plan to do a laparoscopic ileocolectomy. I discussed the risk of surgery including bleeding, infection, leak, damage to adjacent  structures, hernia, wound infection, potential need for additional surgery or procedures, urinary difficulties, cardiac or pulmonary complications, blood clots, death. I discussed postoperative recovery and restrictions. She agrees to proceed. I also reviewed the surgery in addition with diagrams of anatomy.    The patient will need a CEA which we will get with pre operative labs.  She will also need a chest CT, but with her obstructive symptoms, she will need up front surgery anyway, so this would not alter our plans.  30 min spent in evaluation, examination, counseling, and coordination of care.  >50% spent in counseling.       Milus Height MD Surgical Oncology, General and [facility]      Visit Diagnoses: 1. Colon cancer     Primary Care Physician: Nance Pear., NP

## 2013-08-05 NOTE — Assessment & Plan Note (Signed)
Patient has a new diagnosis of colon cancer. This appears to be in the proximal half of the colon based on endoscopy, but Dr Henrene Pastor was not able to traverse the lesion. I advised the patient that we might have to slightly alter the portion of the colon that we take out depending on the location of the tattoos.  Plan to do a laparoscopic ileocolectomy. I discussed the risk of surgery including bleeding, infection, leak, damage to adjacent structures, hernia, wound infection, potential need for additional surgery or procedures, urinary difficulties, cardiac or pulmonary complications, blood clots, death. I discussed postoperative recovery and restrictions. She agrees to proceed. I also reviewed the surgery in addition with diagrams of anatomy.    The patient will need a CEA which we will get with pre operative labs.  She will also need a chest CT, but with her obstructive symptoms, she will need up front surgery anyway, so this would not alter our plans.  30 min spent in evaluation, examination, counseling, and coordination of care.  >50% spent in counseling.

## 2013-08-05 NOTE — Patient Instructions (Signed)
COLON BOWEL PREP                                                                              Please follow the instructions carefully. It is important to clean out your bowels & take the prescribed antibiotic pills to lower your chances of a wound infection or abscess.    FIVE DAYS PRIOR TO YOUR SURGERY  Stop eating any nuts, popcorn, or fruit with seeds. Stop all fiber supplements such as Metamucil, Citrucel, etc.   Hold taking any blood thinning anticoagulation medication (ex: aspirin, warfarin/Coumadin, Plavix, Xarelto, Eliquis, Pradaxa, etc) as recommended by your medical/cardiology doctor  Obtain what you need at a pharmacy of your choice: -Filled out prescriptions for your oral antibiotics (Neomycin & Metronidazole)  -A bottle of MiraLax / Glycolax (288g) - no prescription required  -A large bottle of Gatorade / Powerade (64oz)  -Dulcolax tablets (4 tabs) - no prescription required    DAY PRIOR TO SURGERY   7:00am Swallow 4 Dulcolax tablets with some water Drink plenty of clear liquids all day to avoid getting dehydrated (Water, juice, soda, coffee, tea, bouillon, jello, etc.) 10:00am Mix the bottle of MiraLax with the 64-oz bottle of Gatorade.  Drink the Gatorade mixture gradually over the next few hours (8oz glass every 15-30 minutes) until gone. You should finish by 2pm. 2:00pm Take 2 Neomycin 500mg  tablets & 2 Metronidazole 500mg  tablets 3:00pm Take 2 Neomycin 500mg  tablets & 2 Metronidazole 500mg  tablets  Drink plenty of clear liquids all evening to avoid getting dehydrated 10:00pm Take 2 Neomycin 500mg  tablets & 2 Metronidazole 500mg  tablets  Do not eat or drink anything after bedtime (midnight) the night before your surgery.   MORNING OF SURGERY Remember to not to drink or eat anything that morning  Hold or take medications as recommended by the hospital staff at your Preoperative visit  If you have questions or concerns, please call [facility] [phone]  [phone] during business hours to speak to the clinical staff for advice.     Partial Removal of the colon Your medical providers are recommending removing part of your colon for colon cancer.  Based on the location and type of colon problem you have, the following will help describe what happens when you have this surgery.  TREATMENT When we take out part of the colon, we take an apron of tissue with lymph nodes and blood vessels.  We usually use laparoscopy (smaller incisions) to help minimize the size of the larger incision on your abdomen.  However, even if we use laparoscopy primarily, we still need to make an incision large enough to bring the two ends of the colon out to reconnect them.  For chronic inflammation such as we see in diverticulitis or for tumors that are very large or very adherent to the adjacent structures, we may have to make a larger incision to do your operation safely.    When a section of the colon or rectum is removed, we can usually reconnect the healthy parts. However, sometimes reconnection is not possible. In this case, we will make a new path for your bowel movements to leave the body. This is an opening (a stoma)  in the wall of the abdomen. The upper end of the intestine is then connected to the stoma. The other end is closed. The operation to create the stoma is called a colostomy. A flat bag fits over the stoma to collect waste, and a special adhesive holds it in place.  Most patients are able to do their normal activities with a colostomy, including travelling, exercising, and swimming.    Some colostomies are temporary. The colostomy is needed only until the colon or rectum heals from surgery. Some patients will need to get additional treatment such as chemotherapy (if they are cancer patients) before the ostomy can be taken down.  After this occurs, we can reconnect the parts of the intestine and closes the stoma.  Some  patients need a permanent colostomy, and  some patients are too frail to undergo another surgery to take the ostomy down.    The part of colon that we plan to remove in your case is the right colon where the small bowel attaches to, but this may change if the tumor is in a slightly different location.  Whatever the operative findings, I will discuss the case with your family after we are done in the operating room.  I will talk to you in the next few days when you are more awake.  I will see you in the hospital every weekday that I am not out of town.  My partners help see patients on the weekends and if I am out of town.     IF YOU ARE TAKING ASPIRIN, PLAVIX, COUMADIN, OR OTHER BLOOD THINNERS, LET us KNOW IMMEDIATELY SO WE CAN CONTACT YOUR PRESCRIBING HEALTH CARE PROVIDER TO HOLD THE MEDICATION FOR 5-7 DAYS BEFORE SURGERY    WHAT HAPPENS AFTER SURGERY: After surgery, you will go first to the recovery room, then to your room.   You will have many tubes and lines in place which is standard for this operation.  You will have several IVs, including possibly a central line in your chest or neck.  YOU WILL NOT BE ABLE TO EAT FOR SEVERAL DAYS AFTER SURGERY.  You will have a catheter in your bladder for around 1-3 days.  On your abdomen, you will have and possibly a pain pump with numbing medicine.  You will have compression stockings on your legs to decrease the risk of blood clots.    We will address your pain in several ways.  We will use an IV pain pump called a "PCA," or Patient Controlled Analgesia.  This allows you to press a button and immediately receive a dose of pain medication without waiting for a nurse.  We also use IV Tylenol and sometimes IV Toradol which is similar to ibuprofen.  You may also have a pump with numbing medicine delivered directly to your incision.  I use doses and medications that work for the majority of people, but you may need an adjustment to the dose or type of medicine if your pain is not adequately controlled.   Your throat may be sore, in which case you may need a throat spray or lozenges.  Some patients become disoriented with the pain medication and may need adjustments due to that.    We will ask you to get out of bed the day after surgery in order to maximize your chances of not having complications.  Your risk of pneumonia and blood clots is lower with walking and sitting in the chair.  We will also ask  you to perform breathing exercises.  We will also ask to you walk in your room and in the halls for the above reasons, but also in order for you to keep up your strength.    EATING: We will usually start you on clear liquids in around 1-2 days if your bowel function seems to be returning.  We advance your diet slowly to make sure you are tolerating each step.  All patients do not have a normal appetite when they go home.  Many patients also find that their taste buds do not seem the same right after surgery, and this can continue into the time of possible post operative chemotherapy and radiation.  We may need to use different doses or type of medicine than you use at home to control high blood pressure, diabetes, or other medical problems.   SLEEPING: We do not give sleeping medications in the hospital routinely.  Many patients have difficulty sleeping due to the unfamiliar environment or the medical care that occurs at night.  Sleeping medications can interfere with the pain medications and cause significant mental status changes that are dangerous.    GOING HOME! Usually you are able to go home in 4-8 days, depending on whether or not complications happen and what is going on with your overall health status.   If you have more health problems or if you have limited help at home, the therapists and nurses may recommend a temporary rehab or nursing facility to help you get back on your feet before you go home.  These decisions would be made while you are in the hospital with the assistance of a social  worker or case manager.  You cannot do any heavy lifting for 6-8 weeks due to the risk of hernia.    Please bring all insurance/disability forms to our office for the staff to fill out   POSSIBLE COMPLICATIONS This is a major operation and includes complications listed below: Bleeding Infection and possible wound complications such as hernia Damage to adjacent structures Leak of surgical connections, which can lead to other surgeries and possibly an ostomy. (5-10%) Possible need for other procedures, such as abscess drains in radiology  Possible prolonged hospital stay Possible diarrhea from removal of part of the colon. Possible constipation from narcotics.    Prolonged fatigue/weakness/appetite MOST PATIENTS' ENERGY LEVEL IS NOT BACK TO NORMAL FOR AT LEAST 2-4 MONTHS.  OLDER PATIENTS MAY FEEL WEAK FOR LONGER PERIODS OF TIME.   Difficulty with eating or post operative nausea  Possible cancer found at surgery Possible complications of your medical problems such as heart disease or arrhythmias. Death (less than 1%)  All possible complications are not listed, just the most common.    FURTHER INFORMATION? Please ask questions if you find something that we did not discuss in the office and would like more information.  If you would like another appointment if you have many questions or if your family members would like to come as well, please contact the office.    FOLLOW-UP CARE  Follow-up care after treatment for colorectal cancer is important. Even when the cancer seems to have been completely removed or destroyed, the disease sometimes returns. Undetected cancer cells may still remain somewhere in the body after treatment. We check your recovery and for recurrence of the cancer. Recurrence means that the cancer comes back.  Checkups help make sure that changes in health are found. Checkups may include:  A physical exam (possibly including a digital rectal  exam). This means your  caregiver checks you to see if there are any abnormal changes they can see or feel.   Lab tests (CEA test) may be done. The "fecal occult blood test" checks for blood in the stool. The CEA (carcinoembryonic antigen) is a blood test that looks for a marker of colon cancer in the blood.   A colonoscopy is a test where your caregiver examines your colon with a flexible instrument like a thin telescope which looks at the inside of the large bowel. All patients with colon cancer are recommended to get a colonoscopy 1 year after the surgery  Other specialized x-rays, CT scans, or other tests may be performed.    Between scheduled visits you should contact your caregivers as soon as any health problems appear.

## 2013-08-06 ENCOUNTER — Encounter (HOSPITAL_COMMUNITY): Payer: Self-pay | Admitting: Pharmacy Technician

## 2013-08-10 ENCOUNTER — Telehealth (INDEPENDENT_AMBULATORY_CARE_PROVIDER_SITE_OTHER): Payer: Self-pay

## 2013-08-10 ENCOUNTER — Other Ambulatory Visit (INDEPENDENT_AMBULATORY_CARE_PROVIDER_SITE_OTHER): Payer: Self-pay

## 2013-08-10 ENCOUNTER — Encounter (HOSPITAL_COMMUNITY): Payer: Self-pay | Admitting: *Deleted

## 2013-08-10 DIAGNOSIS — C189 Malignant neoplasm of colon, unspecified: Secondary | ICD-10-CM

## 2013-08-10 MED ORDER — GENTAMICIN SULFATE 40 MG/ML IJ SOLN
5.0000 mg/kg | INTRAVENOUS | Status: AC
  Administered 2013-08-11: 410 mg via INTRAVENOUS
  Filled 2013-08-10: qty 10.25

## 2013-08-10 MED ORDER — CLINDAMYCIN PHOSPHATE 900 MG/50ML IV SOLN
900.0000 mg | INTRAVENOUS | Status: AC
  Administered 2013-08-11: 900 mg via INTRAVENOUS
  Filled 2013-08-10: qty 50

## 2013-08-10 NOTE — Telephone Encounter (Signed)
Pt calling to see if she needs to take her Miralax pre surgery. Pt states that she has taken her stool softener and she has had diarrhea from that. Dr Barry Dienes wants the pt to continue to take her Miralax as prescribed. Informed pt. Pt verbalized understanding.

## 2013-08-11 ENCOUNTER — Inpatient Hospital Stay (HOSPITAL_COMMUNITY)
Admission: RE | Admit: 2013-08-11 | Discharge: 2013-08-19 | DRG: 331 | Disposition: A | Discharge status: Home / Self Care | Payer: No Typology Code available for payment source | Source: Ambulatory Visit | Attending: General Surgery | Admitting: General Surgery

## 2013-08-11 ENCOUNTER — Encounter (HOSPITAL_COMMUNITY)
Admission: RE | Disposition: A | Discharge status: Home / Self Care | Payer: No Typology Code available for payment source | Source: Ambulatory Visit | Attending: General Surgery

## 2013-08-11 ENCOUNTER — Encounter (HOSPITAL_COMMUNITY): Payer: Self-pay | Admitting: *Deleted

## 2013-08-11 ENCOUNTER — Inpatient Hospital Stay (HOSPITAL_COMMUNITY): Payer: No Typology Code available for payment source | Admitting: Anesthesiology

## 2013-08-11 ENCOUNTER — Encounter (HOSPITAL_COMMUNITY): Payer: No Typology Code available for payment source | Admitting: Anesthesiology

## 2013-08-11 DIAGNOSIS — Z9049 Acquired absence of other specified parts of digestive tract: Secondary | ICD-10-CM

## 2013-08-11 DIAGNOSIS — D509 Iron deficiency anemia, unspecified: Secondary | ICD-10-CM | POA: Diagnosis present

## 2013-08-11 DIAGNOSIS — C189 Malignant neoplasm of colon, unspecified: Secondary | ICD-10-CM

## 2013-08-11 DIAGNOSIS — K589 Irritable bowel syndrome without diarrhea: Secondary | ICD-10-CM | POA: Diagnosis present

## 2013-08-11 DIAGNOSIS — F411 Generalized anxiety disorder: Secondary | ICD-10-CM | POA: Diagnosis present

## 2013-08-11 DIAGNOSIS — F329 Major depressive disorder, single episode, unspecified: Secondary | ICD-10-CM | POA: Diagnosis present

## 2013-08-11 DIAGNOSIS — F3289 Other specified depressive episodes: Secondary | ICD-10-CM | POA: Diagnosis present

## 2013-08-11 DIAGNOSIS — E785 Hyperlipidemia, unspecified: Secondary | ICD-10-CM | POA: Diagnosis present

## 2013-08-11 DIAGNOSIS — C182 Malignant neoplasm of ascending colon: Principal | ICD-10-CM | POA: Diagnosis present

## 2013-08-11 DIAGNOSIS — R5383 Other fatigue: Secondary | ICD-10-CM | POA: Diagnosis present

## 2013-08-11 DIAGNOSIS — F988 Other specified behavioral and emotional disorders with onset usually occurring in childhood and adolescence: Secondary | ICD-10-CM | POA: Diagnosis present

## 2013-08-11 DIAGNOSIS — K219 Gastro-esophageal reflux disease without esophagitis: Secondary | ICD-10-CM | POA: Diagnosis present

## 2013-08-11 DIAGNOSIS — K3184 Gastroparesis: Secondary | ICD-10-CM | POA: Diagnosis present

## 2013-08-11 DIAGNOSIS — G2581 Restless legs syndrome: Secondary | ICD-10-CM | POA: Diagnosis present

## 2013-08-11 DIAGNOSIS — R21 Rash and other nonspecific skin eruption: Secondary | ICD-10-CM | POA: Diagnosis not present

## 2013-08-11 HISTORY — PX: COLON SURGERY: SHX602

## 2013-08-11 HISTORY — PX: LAPAROSCOPIC PARTIAL COLECTOMY: SHX5907

## 2013-08-11 HISTORY — DX: Malignant neoplasm of colon, unspecified: C18.9

## 2013-08-11 LAB — BASIC METABOLIC PANEL
ANION GAP: 14 (ref 5–15)
BUN: 12 mg/dL (ref 6–23)
CHLORIDE: 103 meq/L (ref 96–112)
CO2: 24 mEq/L (ref 19–32)
CREATININE: 0.8 mg/dL (ref 0.50–1.10)
Calcium: 9 mg/dL (ref 8.4–10.5)
GFR calc Af Amer: >90 mL/min (ref >90)
GFR calc non Af Amer: >90 mL/min (ref >90)
Glucose, Bld: 135 mg/dL — ABNORMAL HIGH (ref 70–99)
POTASSIUM: 3.6 meq/L — AB (ref 3.7–5.3)
Sodium: 141 mEq/L (ref 137–147)

## 2013-08-11 LAB — ABO/RH: ABO/RH(D): A POS

## 2013-08-11 LAB — CBC
HCT: 30.7 % — ABNORMAL LOW (ref 36.0–46.0)
HEMOGLOBIN: 8.5 g/dL — AB (ref 12.0–15.0)
MCH: 18.7 pg — ABNORMAL LOW (ref 26.0–34.0)
MCHC: 27.7 g/dL — ABNORMAL LOW (ref 30.0–36.0)
MCV: 67.6 fL — ABNORMAL LOW (ref 78.0–100.0)
Platelets: 285 10*3/uL (ref 150–400)
RBC: 4.54 MIL/uL (ref 3.87–5.11)
RDW: 16.7 % — ABNORMAL HIGH (ref 11.5–15.5)
WBC: 5.1 10*3/uL (ref 4.0–10.5)

## 2013-08-11 LAB — HEMOGLOBIN A1C
HEMOGLOBIN A1C: 5.9 % — AB (ref <5.7)
MEAN PLASMA GLUCOSE: 123 mg/dL — AB (ref <117)

## 2013-08-11 LAB — TYPE AND SCREEN
ABO/RH(D): A POS
Antibody Screen: NEGATIVE

## 2013-08-11 SURGERY — LAPAROSCOPIC PARTIAL COLECTOMY
Anesthesia: General | Site: Abdomen | Laterality: Right

## 2013-08-11 MED ORDER — MORPHINE SULFATE 2 MG/ML IJ SOLN
1.0000 mg | INTRAMUSCULAR | Status: DC | PRN
  Administered 2013-08-11 – 2013-08-12 (×2): 4 mg via INTRAVENOUS
  Filled 2013-08-11 (×2): qty 2

## 2013-08-11 MED ORDER — KETOROLAC TROMETHAMINE 15 MG/ML IJ SOLN
15.0000 mg | Freq: Four times a day (QID) | INTRAMUSCULAR | Status: DC | PRN
  Administered 2013-08-13 – 2013-08-15 (×6): 15 mg via INTRAVENOUS
  Filled 2013-08-11 (×5): qty 1

## 2013-08-11 MED ORDER — PHENYLEPHRINE 40 MCG/ML (10ML) SYRINGE FOR IV PUSH (FOR BLOOD PRESSURE SUPPORT)
PREFILLED_SYRINGE | INTRAVENOUS | Status: AC
  Filled 2013-08-11: qty 10

## 2013-08-11 MED ORDER — KETOROLAC TROMETHAMINE 15 MG/ML IJ SOLN
15.0000 mg | Freq: Four times a day (QID) | INTRAMUSCULAR | Status: AC
  Administered 2013-08-11 – 2013-08-12 (×3): 15 mg via INTRAVENOUS
  Filled 2013-08-11 (×4): qty 1

## 2013-08-11 MED ORDER — ENOXAPARIN SODIUM 40 MG/0.4ML ~~LOC~~ SOLN
40.0000 mg | SUBCUTANEOUS | Status: DC
  Administered 2013-08-12 – 2013-08-15 (×4): 40 mg via SUBCUTANEOUS
  Filled 2013-08-11 (×5): qty 0.4

## 2013-08-11 MED ORDER — NALOXONE HCL 0.4 MG/ML IJ SOLN: 0.4000 mg | INTRAMUSCULAR | Status: DC | PRN

## 2013-08-11 MED ORDER — ONDANSETRON HCL 4 MG PO TABS
4.0000 mg | ORAL_TABLET | Freq: Four times a day (QID) | ORAL | Status: DC | PRN
  Administered 2013-08-12 – 2013-08-18 (×3): 4 mg via ORAL
  Filled 2013-08-11 (×3): qty 1

## 2013-08-11 MED ORDER — FENTANYL CITRATE 0.05 MG/ML IJ SOLN
INTRAMUSCULAR | Status: DC | PRN
  Administered 2013-08-11 (×2): 50 ug via INTRAVENOUS
  Administered 2013-08-11: 100 ug via INTRAVENOUS
  Administered 2013-08-11: 200 ug via INTRAVENOUS

## 2013-08-11 MED ORDER — HYDROMORPHONE HCL PF 1 MG/ML IJ SOLN
0.2500 mg | INTRAMUSCULAR | Status: DC | PRN
  Administered 2013-08-11 (×4): 0.5 mg via INTRAVENOUS

## 2013-08-11 MED ORDER — ONDANSETRON HCL 4 MG PO TABS: 4.0000 mg | ORAL_TABLET | Freq: Four times a day (QID) | ORAL | Status: DC | PRN

## 2013-08-11 MED ORDER — ONDANSETRON HCL 4 MG/2ML IJ SOLN
INTRAMUSCULAR | Status: AC
  Filled 2013-08-11: qty 2

## 2013-08-11 MED ORDER — LIDOCAINE HCL (CARDIAC) 20 MG/ML IV SOLN
INTRAVENOUS | Status: DC | PRN
  Administered 2013-08-11: 100 mg via INTRAVENOUS

## 2013-08-11 MED ORDER — ONDANSETRON HCL 4 MG/2ML IJ SOLN: 4.0000 mg | Freq: Four times a day (QID) | INTRAMUSCULAR | Status: DC | PRN

## 2013-08-11 MED ORDER — 0.9 % SODIUM CHLORIDE (POUR BTL) OPTIME
TOPICAL | Status: DC | PRN
  Administered 2013-08-11 (×2): 1000 mL

## 2013-08-11 MED ORDER — ROCURONIUM BROMIDE 50 MG/5ML IV SOLN
INTRAVENOUS | Status: AC
  Filled 2013-08-11: qty 1

## 2013-08-11 MED ORDER — ONDANSETRON HCL 4 MG/2ML IJ SOLN
INTRAMUSCULAR | Status: DC | PRN
  Administered 2013-08-11: 4 mg via INTRAVENOUS

## 2013-08-11 MED ORDER — NEOSTIGMINE METHYLSULFATE 10 MG/10ML IV SOLN
INTRAVENOUS | Status: DC | PRN
  Administered 2013-08-11: 3 mg via INTRAVENOUS

## 2013-08-11 MED ORDER — HYDROMORPHONE HCL PF 1 MG/ML IJ SOLN
INTRAMUSCULAR | Status: AC
  Administered 2013-08-11: 0.5 mg via INTRAVENOUS
  Filled 2013-08-11: qty 1

## 2013-08-11 MED ORDER — BUPIVACAINE 0.25 % ON-Q PUMP DUAL CATH 300 ML
300.0000 mL | INJECTION | Status: DC
  Filled 2013-08-11: qty 300

## 2013-08-11 MED ORDER — BUPIVACAINE-EPINEPHRINE (PF) 0.25% -1:200000 IJ SOLN
INTRAMUSCULAR | Status: DC | PRN
  Administered 2013-08-11: 14:00:00 via INTRAMUSCULAR

## 2013-08-11 MED ORDER — PROPOFOL 10 MG/ML IV BOLUS
INTRAVENOUS | Status: DC | PRN
  Administered 2013-08-11: 120 mg via INTRAVENOUS

## 2013-08-11 MED ORDER — GLYCOPYRROLATE 0.2 MG/ML IJ SOLN
INTRAMUSCULAR | Status: AC
  Filled 2013-08-11: qty 1

## 2013-08-11 MED ORDER — DIPHENHYDRAMINE HCL 50 MG/ML IJ SOLN: 12.5000 mg | Freq: Four times a day (QID) | INTRAMUSCULAR | Status: DC | PRN

## 2013-08-11 MED ORDER — LAMOTRIGINE 100 MG PO TABS
100.0000 mg | ORAL_TABLET | Freq: Every day | ORAL | Status: DC
  Administered 2013-08-11 – 2013-08-19 (×9): 100 mg via ORAL
  Filled 2013-08-11 (×10): qty 1

## 2013-08-11 MED ORDER — ROCURONIUM BROMIDE 100 MG/10ML IV SOLN
INTRAVENOUS | Status: DC | PRN
  Administered 2013-08-11: 10 mg via INTRAVENOUS
  Administered 2013-08-11: 50 mg via INTRAVENOUS

## 2013-08-11 MED ORDER — FENTANYL CITRATE 0.05 MG/ML IJ SOLN
INTRAMUSCULAR | Status: AC
  Filled 2013-08-11: qty 5

## 2013-08-11 MED ORDER — MIDAZOLAM HCL 2 MG/2ML IJ SOLN
INTRAMUSCULAR | Status: AC
  Filled 2013-08-11: qty 2

## 2013-08-11 MED ORDER — GLYCOPYRROLATE 0.2 MG/ML IJ SOLN
INTRAMUSCULAR | Status: DC | PRN
  Administered 2013-08-11: 0.4 mg via INTRAVENOUS

## 2013-08-11 MED ORDER — ROPINIROLE HCL 1 MG PO TABS
4.0000 mg | ORAL_TABLET | Freq: Every day | ORAL | Status: DC
  Administered 2013-08-11 – 2013-08-18 (×8): 4 mg via ORAL
  Filled 2013-08-11 (×11): qty 4

## 2013-08-11 MED ORDER — ALUM & MAG HYDROXIDE-SIMETH 200-200-20 MG/5ML PO SUSP: 30.0000 mL | Freq: Four times a day (QID) | ORAL | Status: DC | PRN

## 2013-08-11 MED ORDER — BUPIVACAINE-EPINEPHRINE (PF) 0.25% -1:200000 IJ SOLN
INTRAMUSCULAR | Status: AC
  Filled 2013-08-11: qty 30

## 2013-08-11 MED ORDER — ALVIMOPAN 12 MG PO CAPS
12.0000 mg | ORAL_CAPSULE | Freq: Once | ORAL | Status: AC
  Administered 2013-08-11: 12 mg via ORAL
  Filled 2013-08-11: qty 1

## 2013-08-11 MED ORDER — MIDAZOLAM HCL 5 MG/5ML IJ SOLN
INTRAMUSCULAR | Status: DC | PRN
  Administered 2013-08-11: 2 mg via INTRAVENOUS

## 2013-08-11 MED ORDER — MORPHINE SULFATE (PF) 1 MG/ML IV SOLN
INTRAVENOUS | Status: AC
  Filled 2013-08-11: qty 25

## 2013-08-11 MED ORDER — CLINDAMYCIN PHOSPHATE 900 MG/50ML IV SOLN
900.0000 mg | Freq: Three times a day (TID) | INTRAVENOUS | Status: AC
  Administered 2013-08-11: 900 mg via INTRAVENOUS
  Filled 2013-08-11 (×2): qty 50

## 2013-08-11 MED ORDER — KCL IN DEXTROSE-NACL 20-5-0.45 MEQ/L-%-% IV SOLN
INTRAVENOUS | Status: DC
  Administered 2013-08-11 – 2013-08-15 (×8): via INTRAVENOUS
  Filled 2013-08-11 (×13): qty 1000

## 2013-08-11 MED ORDER — SODIUM CHLORIDE 0.9 % IJ SOLN: 9.0000 mL | INTRAMUSCULAR | Status: DC | PRN

## 2013-08-11 MED ORDER — KETOROLAC TROMETHAMINE 15 MG/ML IJ SOLN
INTRAMUSCULAR | Status: AC
  Filled 2013-08-11: qty 1

## 2013-08-11 MED ORDER — BUPIVACAINE ON-Q PAIN PUMP (FOR ORDER SET NO CHG)
INJECTION | Status: DC
  Filled 2013-08-11: qty 1

## 2013-08-11 MED ORDER — PROPOFOL 10 MG/ML IV BOLUS
INTRAVENOUS | Status: AC
  Filled 2013-08-11: qty 20

## 2013-08-11 MED ORDER — DIPHENHYDRAMINE HCL 12.5 MG/5ML PO ELIX: 12.5000 mg | ORAL_SOLUTION | Freq: Four times a day (QID) | ORAL | Status: DC | PRN

## 2013-08-11 MED ORDER — ALVIMOPAN 12 MG PO CAPS
12.0000 mg | ORAL_CAPSULE | Freq: Two times a day (BID) | ORAL | Status: DC
  Administered 2013-08-12 – 2013-08-17 (×11): 12 mg via ORAL
  Filled 2013-08-11 (×15): qty 1

## 2013-08-11 MED ORDER — PANTOPRAZOLE SODIUM 40 MG PO TBEC
40.0000 mg | DELAYED_RELEASE_TABLET | Freq: Every day | ORAL | Status: DC
  Administered 2013-08-11 – 2013-08-19 (×9): 40 mg via ORAL
  Filled 2013-08-11 (×8): qty 1

## 2013-08-11 MED ORDER — PHENYLEPHRINE HCL 10 MG/ML IJ SOLN
INTRAMUSCULAR | Status: DC | PRN
  Administered 2013-08-11: 80 ug via INTRAVENOUS

## 2013-08-11 MED ORDER — NEOSTIGMINE METHYLSULFATE 10 MG/10ML IV SOLN
INTRAVENOUS | Status: AC
  Filled 2013-08-11: qty 1

## 2013-08-11 MED ORDER — LIDOCAINE HCL (PF) 1 % IJ SOLN
INTRAMUSCULAR | Status: AC
  Filled 2013-08-11: qty 30

## 2013-08-11 MED ORDER — ONDANSETRON HCL 4 MG/2ML IJ SOLN
4.0000 mg | Freq: Four times a day (QID) | INTRAMUSCULAR | Status: DC | PRN
  Administered 2013-08-11 – 2013-08-12 (×2): 4 mg via INTRAVENOUS
  Filled 2013-08-11 (×2): qty 2

## 2013-08-11 MED ORDER — LACTATED RINGERS IV SOLN
INTRAVENOUS | Status: DC
  Administered 2013-08-11 (×2): via INTRAVENOUS

## 2013-08-11 MED ORDER — MORPHINE SULFATE (PF) 1 MG/ML IV SOLN
INTRAVENOUS | Status: DC
  Administered 2013-08-11 – 2013-08-12 (×3): via INTRAVENOUS
  Filled 2013-08-11 (×2): qty 25

## 2013-08-11 MED ORDER — ACETAMINOPHEN 325 MG PO TABS: 650.0000 mg | ORAL_TABLET | Freq: Four times a day (QID) | ORAL | Status: DC | PRN

## 2013-08-11 SURGICAL SUPPLY — 91 items
APPLIER CLIP 5 13 M/L LIGAMAX5 (MISCELLANEOUS)
APPLIER CLIP ROT 10 11.4 M/L (STAPLE)
APR CLP MED LRG 11.4X10 (STAPLE)
APR CLP MED LRG 5 ANG JAW (MISCELLANEOUS)
BLADE SURG 10 STRL SS (BLADE) ×2 IMPLANT
BLADE SURG ROTATE 9660 (MISCELLANEOUS) IMPLANT
CANISTER SUCTION 2500CC (MISCELLANEOUS) ×3 IMPLANT
CATH KIT ON Q 5IN SLV (PAIN MANAGEMENT) ×2 IMPLANT
CELLS DAT CNTRL 66122 CELL SVR (MISCELLANEOUS) ×1 IMPLANT
CHLORAPREP W/TINT 26ML (MISCELLANEOUS) ×3 IMPLANT
CLIP APPLIE 5 13 M/L LIGAMAX5 (MISCELLANEOUS) IMPLANT
CLIP APPLIE ROT 10 11.4 M/L (STAPLE) IMPLANT
COVER MAYO STAND STRL (DRAPES) ×3 IMPLANT
COVER SURGICAL LIGHT HANDLE (MISCELLANEOUS) ×8 IMPLANT
DRAPE PROXIMA HALF (DRAPES) ×3 IMPLANT
DRAPE UTILITY 15X26 W/TAPE STR (DRAPE) ×15 IMPLANT
DRAPE WARM FLUID 44X44 (DRAPE) ×3 IMPLANT
DRSG OPSITE POSTOP 4X10 (GAUZE/BANDAGES/DRESSINGS) IMPLANT
DRSG OPSITE POSTOP 4X6 (GAUZE/BANDAGES/DRESSINGS) ×2 IMPLANT
DRSG OPSITE POSTOP 4X8 (GAUZE/BANDAGES/DRESSINGS) IMPLANT
DRSG TEGADERM 4X4.75 (GAUZE/BANDAGES/DRESSINGS) ×4 IMPLANT
ELECT BLADE 6.5 EXT (BLADE) ×1 IMPLANT
ELECT CAUTERY BLADE 6.4 (BLADE) ×6 IMPLANT
ELECT REM PT RETURN 9FT ADLT (ELECTROSURGICAL) ×3
ELECTRODE REM PT RTRN 9FT ADLT (ELECTROSURGICAL) ×1 IMPLANT
GAUZE SPONGE 2X2 8PLY STRL LF (GAUZE/BANDAGES/DRESSINGS) IMPLANT
GEL ULTRASOUND 20GR AQUASONIC (MISCELLANEOUS) IMPLANT
GLOVE BIO SURGEON STRL SZ 6 (GLOVE) ×6 IMPLANT
GLOVE BIO SURGEON STRL SZ7.5 (GLOVE) ×10 IMPLANT
GLOVE BIOGEL PI IND STRL 6.5 (GLOVE) ×2 IMPLANT
GLOVE BIOGEL PI IND STRL 7.5 (GLOVE) IMPLANT
GLOVE BIOGEL PI INDICATOR 6.5 (GLOVE) ×8
GLOVE BIOGEL PI INDICATOR 7.5 (GLOVE) ×12
GLOVE EUDERMIC 7 POWDERFREE (GLOVE) IMPLANT
GLOVE INDICATOR 6.5 STRL GRN (GLOVE) ×4 IMPLANT
GOWN STRL REUS W/ TWL LRG LVL3 (GOWN DISPOSABLE) ×6 IMPLANT
GOWN STRL REUS W/ TWL XL LVL3 (GOWN DISPOSABLE) IMPLANT
GOWN STRL REUS W/TWL 2XL LVL3 (GOWN DISPOSABLE) ×6 IMPLANT
GOWN STRL REUS W/TWL LRG LVL3 (GOWN DISPOSABLE) ×15
GOWN STRL REUS W/TWL XL LVL3 (GOWN DISPOSABLE) ×6
KIT BASIN OR (CUSTOM PROCEDURE TRAY) ×3 IMPLANT
KIT ROOM TURNOVER OR (KITS) ×3 IMPLANT
LEGGING LITHOTOMY PAIR STRL (DRAPES) IMPLANT
LIGASURE IMPACT 36 18CM CVD LR (INSTRUMENTS) IMPLANT
MARKER SKIN DUAL TIP RULER LAB (MISCELLANEOUS) ×2 IMPLANT
NS IRRIG 1000ML POUR BTL (IV SOLUTION) ×6 IMPLANT
PAD ARMBOARD 7.5X6 YLW CONV (MISCELLANEOUS) ×6 IMPLANT
PENCIL BUTTON HOLSTER BLD 10FT (ELECTRODE) ×6 IMPLANT
RELOAD PROXIMATE 75MM BLUE (ENDOMECHANICALS) ×6 IMPLANT
RELOAD STAPLE 75 3.8 BLU REG (ENDOMECHANICALS) IMPLANT
RETRACTOR WND ALEXIS 18 MED (MISCELLANEOUS) IMPLANT
RTRCTR WOUND ALEXIS 18CM MED (MISCELLANEOUS) ×3
SCISSORS LAP 5X35 DISP (ENDOMECHANICALS) ×3 IMPLANT
SEALER TISSUE G2 STRG ARTC 35C (ENDOMECHANICALS) ×2 IMPLANT
SET IRRIG TUBING LAPAROSCOPIC (IRRIGATION / IRRIGATOR) IMPLANT
SLEEVE ENDOPATH XCEL 5M (ENDOMECHANICALS) ×5 IMPLANT
SPECIMEN JAR LARGE (MISCELLANEOUS) ×3 IMPLANT
SPONGE GAUZE 2X2 STER 10/PKG (GAUZE/BANDAGES/DRESSINGS) ×2
SPONGE LAP 18X18 X RAY DECT (DISPOSABLE) ×2 IMPLANT
STAPLER GUN LINEAR PROX 60 (STAPLE) ×2 IMPLANT
STAPLER PROXIMATE 75MM BLUE (STAPLE) ×2 IMPLANT
STAPLER VISISTAT 35W (STAPLE) ×3 IMPLANT
SUCTION POOLE TIP (SUCTIONS) ×3 IMPLANT
SURGILUBE 2OZ TUBE FLIPTOP (MISCELLANEOUS) IMPLANT
SUT PDS AB 1 CT  36 (SUTURE) ×4
SUT PDS AB 1 CT 36 (SUTURE) IMPLANT
SUT PDS II 0 TP-1 LOOPED 60 (SUTURE) ×4 IMPLANT
SUT PROLENE 2 0 CT2 30 (SUTURE) IMPLANT
SUT PROLENE 2 0 KS (SUTURE) IMPLANT
SUT VIC AB 2-0 SH 18 (SUTURE) ×1 IMPLANT
SUT VIC AB 3-0 SH 18 (SUTURE) ×3 IMPLANT
SUT VICRYL AB 2 0 TIES (SUTURE) ×3 IMPLANT
SUT VICRYL AB 3 0 TIES (SUTURE) ×3 IMPLANT
SYR 5ML LL (SYRINGE) ×2 IMPLANT
SYR BULB IRRIGATION 50ML (SYRINGE) ×5 IMPLANT
SYS LAPSCP GELPORT 120MM (MISCELLANEOUS)
SYSTEM LAPSCP GELPORT 120MM (MISCELLANEOUS) IMPLANT
TOWEL OR 17X26 10 PK STRL BLUE (TOWEL DISPOSABLE) ×6 IMPLANT
TRAY FOLEY CATH 16FRSI W/METER (SET/KITS/TRAYS/PACK) ×2 IMPLANT
TRAY LAPAROSCOPIC (CUSTOM PROCEDURE TRAY) ×3 IMPLANT
TRAY PROCTOSCOPIC FIBER OPTIC (SET/KITS/TRAYS/PACK) IMPLANT
TROCAR XCEL 12X100 BLDLESS (ENDOMECHANICALS) IMPLANT
TROCAR XCEL BLUNT TIP 100MML (ENDOMECHANICALS) ×2 IMPLANT
TROCAR XCEL NON-BLD 11X100MML (ENDOMECHANICALS) IMPLANT
TROCAR XCEL NON-BLD 5MMX100MML (ENDOMECHANICALS) ×3 IMPLANT
TUBE CONNECTING 12'X1/4 (SUCTIONS) ×2
TUBE CONNECTING 12X1/4 (SUCTIONS) ×4 IMPLANT
TUBING FILTER THERMOFLATOR (ELECTROSURGICAL) ×3 IMPLANT
TUNNELER SHEATH ON-Q 11GX8 DSP (PAIN MANAGEMENT) ×2 IMPLANT
TUNNELER SHEATH ON-Q 16GX12 DP (PAIN MANAGEMENT) ×2 IMPLANT
YANKAUER SUCT BULB TIP NO VENT (SUCTIONS) ×6 IMPLANT

## 2013-08-11 NOTE — Interval H&P Note (Signed)
History and Physical Interval Note:  08/11/2013 10:57 AM  [name]  has presented today for surgery, with the diagnosis of colon cancer  The various methods of treatment have been discussed with the patient and family. After consideration of risks, benefits and other options for treatment, the patient has consented to  Procedure(s): LAPAROSCOPIC RIGHT PARTIAL COLECTOMY (Right) as a surgical intervention .  The patient's history has been reviewed, patient examined, no change in status, stable for surgery.  I have reviewed the patient's chart and labs.  Questions were answered to the patient's satisfaction.     Hedy Garro

## 2013-08-11 NOTE — Anesthesia Preprocedure Evaluation (Signed)
Anesthesia Evaluation  Patient identified by MRN, date of birth, ID band Patient awake    Reviewed: Allergy & Precautions, H&P , NPO status , Patient's Chart, lab work & pertinent test results  Airway Mallampati: II TM Distance: >3 FB Neck ROM: Full    Dental  (+) Teeth Intact, Dental Advisory Given   Pulmonary          Cardiovascular     Neuro/Psych    GI/Hepatic   Endo/Other    Renal/GU      Musculoskeletal   Abdominal   Peds  Hematology   Anesthesia Other Findings   Reproductive/Obstetrics                           Anesthesia Physical Anesthesia Plan  ASA: II  Anesthesia Plan: General   Post-op Pain Management:    Induction: Intravenous  Airway Management Planned: Oral ETT  Additional Equipment: None  Intra-op Plan:   Post-operative Plan: Extubation in OR  Informed Consent: I have reviewed the patients History and Physical, chart, labs and discussed the procedure including the risks, benefits and alternatives for the proposed anesthesia with the patient or authorized representative who has indicated his/her understanding and acceptance.   Dental advisory given  Plan Discussed with: CRNA, Anesthesiologist and Surgeon  Anesthesia Plan Comments:         Anesthesia Quick Evaluation

## 2013-08-11 NOTE — Anesthesia Procedure Notes (Signed)
Procedure Name: Intubation Date/Time: 08/11/2013 12:10 PM Performed by: Melina Copa, [place] Pre-anesthesia Checklist: Patient identified, Emergency Drugs available, Suction available, Patient being monitored and Timeout performed Patient Re-evaluated:Patient Re-evaluated prior to inductionOxygen Delivery Method: Circle system utilized Preoxygenation: Pre-oxygenation with 100% oxygen Intubation Type: IV induction Ventilation: Mask ventilation without difficulty Laryngoscope Size: Mac and 3 Grade View: Grade II Tube type: Oral Tube size: 7.5 mm Number of attempts: 1 Airway Equipment and Method: Stylet Placement Confirmation: ETT inserted through vocal cords under direct vision,  positive ETCO2 and breath sounds checked- equal and bilateral Secured at: 21 cm Tube secured with: Tape Dental Injury: Teeth and Oropharynx as per pre-operative assessment

## 2013-08-11 NOTE — Discharge Instructions (Signed)
[facility]      [facility] °[phone] ° °ABDOMINAL SURGERY: POST OP INSTRUCTIONS ° °Always review your discharge instruction sheet given to you by the facility where your surgery was performed. ° °IF YOU HAVE DISABILITY OR FAMILY LEAVE FORMS, YOU MUST BRING THEM TO THE OFFICE FOR PROCESSING.  PLEASE DO NOT GIVE THEM TO YOUR DOCTOR. ° °1. A prescription for pain medication may be given to you upon discharge.  Take your pain medication as prescribed, if needed.  If narcotic pain medicine is not needed, then you may take acetaminophen (Tylenol) or ibuprofen (Advil) as needed. °2. Take your usually prescribed medications unless otherwise directed. °3. If you need a refill on your pain medication, please contact your pharmacy. They will contact our office to request authorization.  Prescriptions will not be filled after 5pm or on week-ends. °4. You should follow a light diet the first few days after arrival home, such as soup and crackers, pudding, etc.unless your doctor has advised otherwise. A high-fiber, low fat diet can be resumed as tolerated.   Be sure to include lots of fluids daily. Most patients will experience some swelling and bruising on the chest and neck area.  Ice packs will help.  Swelling and bruising can take several days to resolve °5. Most patients will experience some swelling and bruising in the area of the incision. Ice pack will help. Swelling and bruising can take several days to resolve..  °6. It is common to experience some constipation if taking pain medication after surgery.  Increasing fluid intake and taking a stool softener will usually help or prevent this problem from occurring.  A mild laxative (Milk of Magnesia or Miralax) should be taken according to package directions if there are no bowel movements after 48 hours. °7.  You may have steri-strips (small skin tapes) in place directly over the incision.  These strips should be left on the skin for 10-14 days.  If your  surgeon used skin glue on the incision, you may shower in 48 hours.  The glue will flake off over the next 2-3 weeks.  Any sutures or staples will be removed at the office during your follow-up visit. You may find that a light gauze bandage over your incision may keep your staples from being rubbed or pulled. You may shower and replace the bandage daily. °8. ACTIVITIES:  You may resume regular (light) daily activities beginning the next day--such as daily self-care, walking, climbing stairs--gradually increasing activities as tolerated.  You may have sexual intercourse when it is comfortable.  Refrain from any heavy lifting or straining until approved by your doctor. °a. You may drive when you no longer are taking prescription pain medication, you can comfortably wear a seatbelt, and you can safely maneuver your car and apply brakes °b. Return to Work: __________4-6 weeks if applicable_________________________ °9. You should see your doctor in the office for a follow-up appointment approximately two weeks after your surgery.  Make sure that you call for this appointment within a day or two after you arrive home to insure a convenient appointment time. °OTHER INSTRUCTIONS:  °_____________________________________________________________ °_____________________________________________________________ ° °WHEN TO CALL YOUR DOCTOR: °1. Fever over 101.0 °2. Inability to urinate °3. Nausea and/or vomiting °4. Extreme swelling or bruising °5. Continued bleeding from incision. °6. Increased pain, redness, or drainage from the incision. °7. Difficulty swallowing or breathing °8. Muscle cramping or spasms. °9. Numbness or tingling in hands or feet or around lips. ° °The clinic staff is   available to answer your questions during regular business hours.  Please don’t hesitate to call and ask to speak to one of the nurses if you have concerns. ° °For further questions, please visit [email or web address] ° ° ° °

## 2013-08-11 NOTE — H&P (View-Only) (Signed)
Chief complaint:  New diagnosis of colon cancer  HISTORY: Patient is a 37-year-old female has been having around 4-5 years of GI difficulty. She is referred in consultation by Dr. John [name] for evaluation of her colon cancer.  Her Gi issues have gotten dramatically worse over the past 6 months. She has started having blood in her stool and significant constipation. She has upper abdominal pain and difficulty in. He has lost around 17 pounds. She describes having PICA 2 is. She states when she she felt like things are at the top of her abdomen. She did not have diffuse bloating but does have bloating in her upper abdomen. She also feels quite fatigued.  She ended up getting a colonoscopy due to the increasing difficulties with constipation. She was found to have a circumferential mass that was partially obstructing and her proximal colon.  Past Medical History  Diagnosis Date  . Seizures   . Restless leg   . Depression   . Hyperlipidemia   . Allergic rhinitis   . Narcolepsy   . Migraine   . Anemia, iron deficiency 01/28/2013  . GERD (gastroesophageal reflux disease)   . IBS (irritable bowel syndrome)   . Anxiety   . Gastroparesis   . Allergy     Past Surgical History  Procedure Laterality Date  . Appendectomy  2006  . Tubal ligation  2002  . Abdominal hysterectomy  2006  . Esophagogastroduodenoscopy  2011    normal    Current Outpatient Prescriptions  Medication Sig Dispense Refill  . lamoTRIgine (LAMICTAL) 100 MG tablet Take 100 mg by mouth daily.      . omeprazole (PRILOSEC OTC) 20 MG tablet Take 40 mg by mouth daily.       . ondansetron (ZOFRAN) 4 MG tablet Take 1 tablet (4 mg total) by mouth every 6 (six) hours.  30 tablet  0  . rOPINIRole (REQUIP) 4 MG tablet Take 1 tablet (4 mg total) by mouth at bedtime.  30 tablet  2  . [DISCONTINUED] desvenlafaxine (PRISTIQ) 100 MG 24 hr tablet Take 100 mg by mouth daily.         No current facility-administered medications for this  visit.     Allergies  Allergen Reactions  . Sulfa Antibiotics Other (See Comments)    Tongue swelling  . Eggs Or Egg-Derived Products Nausea And Vomiting  . Metoclopramide Hcl Other (See Comments)    Intensifies restless leg  . Penicillins Itching  . Reglan [Metoclopramide]     Irritates restless legs     Family History  Problem Relation Age of Onset  . Heart attack Mother   . Colon polyps Mother   . Colitis Mother   . Diabetes Father   . Heart attack Father   . Stroke Father   . Cirrhosis Maternal Aunt   . Cancer Maternal Uncle     unknown type  . Colon cancer Neg Hx   . Esophageal cancer Neg Hx   . Rectal cancer Neg Hx   . Stomach cancer Neg Hx      History   Social History  . Marital Status: Married    Spouse Name: N/A    Number of Children: 2  . Years of Education: N/A   Occupational History  . Unemployed- full time student     Completed 12th grade   Social History Main Topics  . Smoking status: Never Smoker   . Smokeless tobacco: Never Used  . Alcohol Use: No  .   Drug Use: No  . Sexual Activity: None   Other Topics Concern  . None   Social History Narrative  . None     REVIEW OF SYSTEMS - PERTINENT POSITIVES ONLY: 12 point review of systems negative other than HPI and PMH   EXAM: Filed Vitals:   08/05/13 0901  BP: 126/68  Pulse: 70  Resp: 14    Wt Readings from Last 3 Encounters:  08/05/13 182 lb (82.555 kg)  07/17/13 187 lb (84.823 kg)  06/11/13 187 lb 11.2 oz (85.14 kg)     Gen:  No acute distress.  Well nourished and well groomed.   Neurological: Alert and oriented to person, place, and time. Coordination normal.  Head: Normocephalic and atraumatic.  Eyes: Conjunctivae are normal. Pupils are equal, round, and reactive to light. No scleral icterus.  Neck: Normal range of motion. Neck supple. No tracheal deviation or thyromegaly present.  Cardiovascular: Normal rate, regular rhythm, normal heart sounds and intact distal pulses.   Exam reveals no gallop and no friction rub.  No murmur heard. Respiratory: Effort normal.  No respiratory distress. No chest wall tenderness. Breath sounds normal.  No wheezes, rales or rhonchi.  GI: Soft. Bowel sounds are normal. The abdomen is soft and nontender.  There is no rebound and no guarding.  Musculoskeletal: Normal range of motion. Extremities are nontender.  Lymphadenopathy: No cervical, preauricular, postauricular or axillary adenopathy is present Skin: Skin is warm and dry. No rash noted. No diaphoresis. No erythema. No pallor. No clubbing, cyanosis, or edema.   Psychiatric: Normal mood and affect. Behavior is normal. Judgment and thought content normal.    LABORATORY RESULTS: Available labs are reviewed   Recent Results (from the past 2160 hour(s))  CBC WITH DIFFERENTIAL     Status: Abnormal   Collection Time    06/09/13  9:35 AM      Result Value Ref Range   WBC 5.8  4.0 - 10.5 K/uL   RBC 4.58  3.87 - 5.11 MIL/uL   Hemoglobin 9.4 (*) 12.0 - 15.0 g/dL   HCT 31.0 (*) 36.0 - 46.0 %   MCV 67.7 (*) 78.0 - 100.0 fL   MCH 20.5 (*) 26.0 - 34.0 pg   MCHC 30.3  30.0 - 36.0 g/dL   RDW 15.7 (*) 11.5 - 15.5 %   Platelets 258  150 - 400 K/uL   Neutrophils Relative % 78 (*) 43 - 77 %   Lymphocytes Relative 15  12 - 46 %   Monocytes Relative 6  3 - 12 %   Eosinophils Relative 1  0 - 5 %   Basophils Relative 0  0 - 1 %   Neutro Abs 4.5  1.7 - 7.7 K/uL   Lymphs Abs 0.9  0.7 - 4.0 K/uL   Monocytes Absolute 0.3  0.1 - 1.0 K/uL   Eosinophils Absolute 0.1  0.0 - 0.7 K/uL   Basophils Absolute 0.0  0.0 - 0.1 K/uL   RBC Morphology POLYCHROMASIA PRESENT     Smear Review LARGE PLATELETS PRESENT    COMPREHENSIVE METABOLIC PANEL     Status: Abnormal   Collection Time    06/09/13  9:35 AM      Result Value Ref Range   Sodium 136 (*) 137 - 147 mEq/L   Potassium 3.7  3.7 - 5.3 mEq/L   Chloride 103  96 - 112 mEq/L   CO2 21  19 - 32 mEq/L   Glucose, Bld 153 (*) 70 -  99 mg/dL   BUN 12   6 - 23 mg/dL   Creatinine, Ser 0.76  0.50 - 1.10 mg/dL   Calcium 9.0  8.4 - 10.5 mg/dL   Total Protein 6.8  6.0 - 8.3 g/dL   Albumin 3.7  3.5 - 5.2 g/dL   AST 24  0 - 37 U/L   ALT 25  0 - 35 U/L   Alkaline Phosphatase 83  39 - 117 U/L   Total Bilirubin 0.6  0.3 - 1.2 mg/dL   GFR calc non Af Amer >90  >90 mL/min   GFR calc Af Amer >90  >90 mL/min   Comment: (NOTE)     The eGFR has been calculated using the CKD EPI equation.     This calculation has not been validated in all clinical situations.     eGFR's persistently <90 mL/min signify possible Chronic Kidney     Disease.  LIPASE, BLOOD     Status: None   Collection Time    06/09/13  9:35 AM      Result Value Ref Range   Lipase 24  11 - 59 U/L  URINALYSIS, ROUTINE W REFLEX MICROSCOPIC     Status: Abnormal   Collection Time    06/09/13 10:45 AM      Result Value Ref Range   Color, Urine AMBER (*) YELLOW   Comment: BIOCHEMICALS MAY BE AFFECTED BY COLOR   APPearance CLOUDY (*) CLEAR   Specific Gravity, Urine 1.024  1.005 - 1.030   pH 5.5  5.0 - 8.0   Glucose, UA NEGATIVE  NEGATIVE mg/dL   Hgb urine dipstick NEGATIVE  NEGATIVE   Bilirubin Urine NEGATIVE  NEGATIVE   Ketones, ur NEGATIVE  NEGATIVE mg/dL   Protein, ur NEGATIVE  NEGATIVE mg/dL   Urobilinogen, UA 0.2  0.0 - 1.0 mg/dL   Nitrite NEGATIVE  NEGATIVE   Leukocytes, UA NEGATIVE  NEGATIVE   Comment: MICROSCOPIC NOT DONE ON URINES WITH NEGATIVE PROTEIN, BLOOD, LEUKOCYTES, NITRITE, OR GLUCOSE <1000 mg/dL.  FERRITIN     Status: Abnormal   Collection Time    06/11/13 10:08 AM      Result Value Ref Range   Ferritin 4.5 (*) 10.0 - 291.0 ng/mL  IRON AND TIBC     Status: Abnormal   Collection Time    06/11/13 10:08 AM      Result Value Ref Range   Iron 16 (*) 42 - 145 ug/dL   UIBC 413 (*) 125 - 400 ug/dL   TIBC 429  250 - 470 ug/dL   %SAT 4 (*) 20 - 55 %     RADIOLOGY RESULTS: See E-Chart or I-Site for most recent results.  Images and reports are reviewed.  Ct  Abdomen Pelvis W Contrast  07/21/2013   CLINICAL DATA:  Iron deficiency anemia. Recent colonic mass diagnosed but colonoscopy.  EXAM: CT ABDOMEN AND PELVIS WITH CONTRAST  TECHNIQUE: Multidetector CT imaging of the abdomen and pelvis was performed using the standard protocol following bolus administration of intravenous contrast.  CONTRAST:  80mL OMNIPAQUE IOHEXOL 300 MG/ML  SOLN  COMPARISON:  None.  FINDINGS: Short-segment of annular wall thickening is seen involving the ascending colon, suspicious colon carcinoma. No evidence of bowel obstruction. Mild mesenteric lymphadenopathy is seen in the right abdomen medial to this mass, with largest lymph node measuring 12 mm on image 45. This is suspicious for local nodal metastases.  No lymphadenopathy seen elsewhere within the abdomen or pelvis. No liver masses are   identified. The gallbladder, pancreas, spleen, adrenal glands, and kidneys are normal in appearance. No evidence hydronephrosis.  No evidence of inflammatory process or abnormal fluid collections. Prior hysterectomy noted. Right ovary visualized and no adnexal masses identified.  IMPRESSION: Short-segment annular wall thickening involving the ascending colon, highly suspicious for primary colon carcinoma.  Mild right abdominal mesenteric lymphadenopathy, consistent with nodal metastases.  No evidence of distant metastatic disease within the abdomen or pelvis.   Electronically Signed   By: Earle Gell M.D.   On: 07/21/2013 15:56      ASSESSMENT AND PLAN: Colon cancer Patient has a new diagnosis of colon cancer. This appears to be in the proximal half of the colon based on endoscopy, but Dr Henrene Pastor was not able to traverse the lesion. I advised the patient that we might have to slightly alter the portion of the colon that we take out depending on the location of the tattoos.  Plan to do a laparoscopic ileocolectomy. I discussed the risk of surgery including bleeding, infection, leak, damage to adjacent  structures, hernia, wound infection, potential need for additional surgery or procedures, urinary difficulties, cardiac or pulmonary complications, blood clots, death. I discussed postoperative recovery and restrictions. She agrees to proceed. I also reviewed the surgery in addition with diagrams of anatomy.    The patient will need a CEA which we will get with pre operative labs.  She will also need a chest CT, but with her obstructive symptoms, she will need up front surgery anyway, so this would not alter our plans.  30 min spent in evaluation, examination, counseling, and coordination of care.  >50% spent in counseling.       Milus Height MD Surgical Oncology, General and [facility]      Visit Diagnoses: 1. Colon cancer     Primary Care Physician: Nance Pear., NP

## 2013-08-11 NOTE — Transfer of Care (Signed)
Immediate Anesthesia Transfer of Care Note  Patient: [name]  Procedure(s) Performed: Procedure(s): LAPAROSCOPIC RIGHT HEMICOLECTOMY (Right)  Patient Location: PACU  Anesthesia Type:General  Level of Consciousness: awake, alert  and oriented  Airway & Oxygen Therapy: Patient Spontanous Breathing and Patient connected to nasal cannula oxygen  Post-op Assessment: Report given to PACU RN, Post -op Vital signs reviewed and stable and Patient moving all extremities  Post vital signs: Reviewed and stable  Complications: No apparent anesthesia complications

## 2013-08-11 NOTE — Anesthesia Postprocedure Evaluation (Signed)
  Anesthesia Post-op Note  Patient: Willard Madrigal  Procedure(s) Performed: Procedure(s): LAPAROSCOPIC RIGHT HEMICOLECTOMY (Right)  Patient Location: PACU  Anesthesia Type: General   Level of Consciousness: awake, alert  and oriented  Airway and Oxygen Therapy: Patient Spontanous Breathing  Post-op Pain: mild  Post-op Assessment: Post-op Vital signs reviewed  Post-op Vital Signs: Reviewed  Last Vitals:  Filed Vitals:   08/11/13 1500  BP: 116/71  Pulse: 58  Temp:   Resp: 18    Complications: No apparent anesthesia complications  Anesthesia Post-op Note  Patient: Karolee Ohs  Procedure(s) Performed: Procedure(s): LAPAROSCOPIC RIGHT HEMICOLECTOMY (Right)  Patient Location: PACU  Anesthesia Type: General   Level of Consciousness: awake, alert  and oriented  Airway and Oxygen Therapy: Patient Spontanous Breathing  Post-op Pain: mild  Post-op Assessment: Post-op Vital signs reviewed  Post-op Vital Signs: Reviewed  Last Vitals:  Filed Vitals:   08/11/13 1500  BP: 116/71  Pulse: 58  Temp:   Resp: 18    Complications: No apparent anesthesia complications

## 2013-08-11 NOTE — Op Note (Signed)
Laparoscopic Right Hemicolectomy Procedure Note  Indications: This patient presents for a laparoscopic partial colectomy for colon cancer  Pre-operative Diagnosis:  Colon cancer  Post-operative Diagnosis:  Same  Surgeon: Stark Klein   Assistants: Sharyn Dross RNFA  Anesthesia: General endotracheal anesthesia and Local anesthesia 1% buffered lidocaine, 0.25.% bupivacaine, with epinephrine  ASA Class: 2  Procedure Details  The patient was seen in the Holding Room. The risks, benefits, complications, treatment options, and expected outcomes were discussed with the patient. The possibilities of reaction to medication, perforation of viscus, bleeding, recurrent infection, finding a normal colon, the need for additional procedures, failure to diagnose a condition, and creating a complication requiring transfusion or operation were discussed with the patient. The patient was advised of the risk of ostomy.  The patient concurred with the proposed plan, giving informed consent.   The patient was taken to the operating room, identified, and the procedure verified as partial colectomy. A Time Out was held and the above information confirmed.  The patient was brought to the operating room and placed supine. After induction of a general anesthetic, a Foley catheter was inserted and the abdomen was prepped and draped in standard fashion. A vertical incision 1.5 cm in length was made with a #11 blade just above the umbilicus after administration of local.  The subcutaneous tissues were spread with a kelly clamp.  The fascia was elevated with 2 Kocher clamps and the fascia was incised in the midline with a #11 blade.  A kelly was used to confirm entrance into the peritoneal cavity.  A 0-0 vicryl pursestring suture was placed.  The Hasson trocar was introduced into the abdomen, and the tails of the suture were used to secure the trocar to the abdominal wall.     Pneumoperitoneum was insufflated to a pressure  of 15 mm Hg. The laparoscope was introduced.    Exploration revealed a normal omentum, colon, small bowel, peritoneum, liver, and stomach. Two 5 mm midline  trocars were then placed after anesthetizing the skin and peritoneum with local anesthetic. The ascending colon and hepatic flexure were then mobilized with gentle retraction of the colon in a medial direction with mobilization of the peritoneal reflection with cautery and the EnSeal.  The omentum was taken off the transverse colon. Mobilization of this area was complete to expose the retroperitoneum.  There was minimal blood loss during this portion of the procedure.      The colon was evaluated to make sure it was adequately mobilized.  A 6 cm vertical incision was made above the umbilicus to incorporate the Hasson port and the next most superior midline port.  The subcutaneous tissues were divided with the cautery.  The fascia was divided with the cautery as well. The fascial incision was carried out to the point of the skin incision. The protractor was placed and the abdomen to protect the skin. The colon was delivered through the incision.  The colon was resected with a linear stapling device proximal and distal to the area in question in regard to the specimen. The mesenteric vessels were divided with the EnSeal.  The specimen was submitted to pathology.      An side-to-side, functional end-to-end anastomosis was performed through the small anterior incision with the linear stapling device. The mucosa was inspected and found to be hemostatic. Closure was achieved with the transverse stapling device. A 3-0 vicryl suture was used to reapproximate the angle of the anastomosis. Hemostasis was confirmed. The bowel anastomosis was returned.  The colon protocol was followed to make sure that the drapes were covered and the bovie/suction from the colon anastamosis were passed off.  The protractor was removed as well.  The abdomen was irrigated.    The OnQ  tunneler was placed on both sides of the fascial incision. The fascial incision was then closed with two running 0 looped PDS sutures. The abdomen was reinsufflated and inspected in all four quadrants.  There was no evidence of bile or bleeding.  The incisions were closed with staples. The OnQ catheters were advanced through the wound.  Soft dressings were applied.  Instrument, sponge, and needle counts were correct prior to abdominal closure and at the conclusion of the case.   Findings: large mass at hepatic flexure  Estimated Blood Loss: min         Drains: none  Specimens: right colon            Complications: None; patient tolerated the procedure well.         Disposition: PACU - hemodynamically stable.

## 2013-08-12 ENCOUNTER — Encounter (HOSPITAL_COMMUNITY): Payer: Self-pay | Admitting: General Surgery

## 2013-08-12 LAB — CBC
HCT: 25 % — ABNORMAL LOW (ref 36.0–46.0)
HEMOGLOBIN: 7.1 g/dL — AB (ref 12.0–15.0)
MCH: 19.5 pg — AB (ref 26.0–34.0)
MCHC: 28.4 g/dL — ABNORMAL LOW (ref 30.0–36.0)
MCV: 68.7 fL — AB (ref 78.0–100.0)
Platelets: 259 10*3/uL (ref 150–400)
RBC: 3.64 MIL/uL — AB (ref 3.87–5.11)
RDW: 16.9 % — ABNORMAL HIGH (ref 11.5–15.5)
WBC: 6.2 10*3/uL (ref 4.0–10.5)

## 2013-08-12 LAB — BASIC METABOLIC PANEL
Anion gap: 12 (ref 5–15)
BUN: 14 mg/dL (ref 6–23)
CHLORIDE: 100 meq/L (ref 96–112)
CO2: 26 mEq/L (ref 19–32)
Calcium: 8.4 mg/dL (ref 8.4–10.5)
Creatinine, Ser: 0.76 mg/dL (ref 0.50–1.10)
GFR calc Af Amer: >90 mL/min (ref >90)
GFR calc non Af Amer: >90 mL/min (ref >90)
GLUCOSE: 128 mg/dL — AB (ref 70–99)
POTASSIUM: 4 meq/L (ref 3.7–5.3)
Sodium: 138 mEq/L (ref 137–147)

## 2013-08-12 LAB — MAGNESIUM: Magnesium: 1.7 mg/dL (ref 1.5–2.5)

## 2013-08-12 LAB — CEA: CEA: 12 ng/mL — ABNORMAL HIGH (ref 0.0–5.0)

## 2013-08-12 LAB — PHOSPHORUS: Phosphorus: 4.4 mg/dL (ref 2.3–4.6)

## 2013-08-12 MED ORDER — SODIUM CHLORIDE 0.9 % IJ SOLN: 9.0000 mL | INTRAMUSCULAR | Status: DC | PRN

## 2013-08-12 MED ORDER — ONDANSETRON HCL 4 MG/2ML IJ SOLN
4.0000 mg | Freq: Four times a day (QID) | INTRAMUSCULAR | Status: DC | PRN
  Administered 2013-08-14 – 2013-08-17 (×3): 4 mg via INTRAVENOUS
  Filled 2013-08-12 (×3): qty 2

## 2013-08-12 MED ORDER — DIPHENHYDRAMINE HCL 50 MG/ML IJ SOLN
12.5000 mg | Freq: Four times a day (QID) | INTRAMUSCULAR | Status: DC | PRN
  Administered 2013-08-13 – 2013-08-18 (×3): 12.5 mg via INTRAVENOUS
  Filled 2013-08-12 (×3): qty 1

## 2013-08-12 MED ORDER — HYDROMORPHONE HCL PF 1 MG/ML IJ SOLN
0.5000 mg | INTRAMUSCULAR | Status: DC | PRN
  Administered 2013-08-12: 1 mg via INTRAVENOUS
  Administered 2013-08-12: 2 mg via INTRAVENOUS
  Filled 2013-08-12: qty 2
  Filled 2013-08-12: qty 1

## 2013-08-12 MED ORDER — DIPHENHYDRAMINE HCL 25 MG PO CAPS
25.0000 mg | ORAL_CAPSULE | Freq: Once | ORAL | Status: AC
  Administered 2013-08-12: 25 mg via ORAL
  Filled 2013-08-12: qty 1

## 2013-08-12 MED ORDER — ONDANSETRON HCL 4 MG/2ML IJ SOLN: 4.0000 mg | Freq: Four times a day (QID) | INTRAMUSCULAR | Status: DC | PRN

## 2013-08-12 MED ORDER — HYDROMORPHONE 0.3 MG/ML IV SOLN
INTRAVENOUS | Status: DC
  Administered 2013-08-12: 4.2 mg via INTRAVENOUS
  Administered 2013-08-12: 2.844 mg via INTRAVENOUS
  Administered 2013-08-12: 12:00:00 via INTRAVENOUS
  Administered 2013-08-13: 2.7 mg via INTRAVENOUS
  Administered 2013-08-13: 07:00:00 via INTRAVENOUS
  Administered 2013-08-13: 6.74 mg via INTRAVENOUS
  Administered 2013-08-13: 3.9 mg via INTRAVENOUS
  Administered 2013-08-13: 16:00:00 via INTRAVENOUS
  Administered 2013-08-13: 1.8 mg via INTRAVENOUS
  Administered 2013-08-14: 02:00:00 via INTRAVENOUS
  Administered 2013-08-14: 2.7 mg via INTRAVENOUS
  Administered 2013-08-14: 09:00:00 via INTRAVENOUS
  Administered 2013-08-14: 5.9 mg via INTRAVENOUS
  Administered 2013-08-14: 2.4 mg via INTRAVENOUS
  Administered 2013-08-14: 4.91 mg via INTRAVENOUS
  Administered 2013-08-14: 2.7 mg via INTRAVENOUS
  Administered 2013-08-14: 3.9 mg via INTRAVENOUS
  Administered 2013-08-15: 3 mg via INTRAVENOUS
  Administered 2013-08-15: 2.7 mg via INTRAVENOUS
  Administered 2013-08-15: via INTRAVENOUS
  Administered 2013-08-15: 0.6 mg via INTRAVENOUS
  Administered 2013-08-15: 22:00:00 via INTRAVENOUS
  Administered 2013-08-15: 3.6 mg via INTRAVENOUS
  Administered 2013-08-15: 3 mg via INTRAVENOUS
  Administered 2013-08-15: 1.99 mg via INTRAVENOUS
  Administered 2013-08-16: 2.7 mg via INTRAVENOUS
  Administered 2013-08-16: 0.3 mg via INTRAVENOUS
  Administered 2013-08-16: 0.6 mg via INTRAVENOUS
  Administered 2013-08-16: 2.7 mg via INTRAVENOUS
  Administered 2013-08-16: 3 mg via INTRAVENOUS
  Administered 2013-08-16: 1.98 mg via INTRAVENOUS
  Administered 2013-08-16: 22:00:00 via INTRAVENOUS
  Administered 2013-08-17: 3.9 mg via INTRAVENOUS
  Administered 2013-08-17: 07:00:00 via INTRAVENOUS
  Administered 2013-08-17: 3.9 mg via INTRAVENOUS
  Filled 2013-08-12 (×13): qty 25

## 2013-08-12 MED ORDER — DIPHENHYDRAMINE HCL 50 MG/ML IJ SOLN: 12.5000 mg | Freq: Four times a day (QID) | INTRAMUSCULAR | Status: DC | PRN

## 2013-08-12 MED ORDER — BOOST / RESOURCE BREEZE PO LIQD
1.0000 | Freq: Three times a day (TID) | ORAL | Status: DC
  Administered 2013-08-12 – 2013-08-14 (×6): 1 via ORAL

## 2013-08-12 MED ORDER — NALOXONE HCL 0.4 MG/ML IJ SOLN: 0.4000 mg | INTRAMUSCULAR | Status: DC | PRN

## 2013-08-12 MED ORDER — DIPHENHYDRAMINE HCL 12.5 MG/5ML PO ELIX: 12.5000 mg | ORAL_SOLUTION | Freq: Four times a day (QID) | ORAL | Status: DC | PRN

## 2013-08-12 MED ORDER — HYDROMORPHONE 0.3 MG/ML IV SOLN
INTRAVENOUS | Status: DC
Start: 2013-08-12 — End: 2013-08-12

## 2013-08-12 MED ORDER — HYDROMORPHONE 0.3 MG/ML IV SOLN: INTRAVENOUS | Status: DC

## 2013-08-12 MED ORDER — DIPHENHYDRAMINE HCL 12.5 MG/5ML PO ELIX
12.5000 mg | ORAL_SOLUTION | Freq: Four times a day (QID) | ORAL | Status: DC | PRN
  Administered 2013-08-13 – 2013-08-19 (×2): 12.5 mg via ORAL
  Filled 2013-08-12 (×2): qty 10

## 2013-08-12 NOTE — Progress Notes (Signed)
1 Day Post-Op  Subjective: Pt had issues with pain control.  She denies nausea.    Objective: Vital signs in last 24 hours: Temp:  [97.3 F (36.3 C)-98.8 F (37.1 C)] 98.3 F (36.8 C) (07/08 0604) Pulse Rate:  [56-77] 72 (07/08 0604) Resp:  [11-18] 15 (07/08 0604) BP: (97-129)/(55-91) 102/55 mmHg (07/08 0604) SpO2:  [97 %-100 %] 100 % (07/08 0604) Weight:  [182 lb (82.555 kg)] 182 lb (82.555 kg) (07/07 1021) Last BM Date: 08/10/13  Intake/Output from previous day: 07/07 0701 - 07/08 0700 In: 2080 [P.O.:580; I.V.:1500] Out: 725 [Urine:675; Blood:50] Intake/Output this shift:    General appearance: alert, cooperative and mild distress Resp: breathing comfortably GI: soft, approp tender.  sl distended.  Lab Results:   Recent Labs  08/11/13 1031 08/12/13 0035  WBC 5.1 6.2  HGB 8.5* 7.1*  HCT 30.7* 25.0*  PLT 285 259   BMET  Recent Labs  08/11/13 1031 08/12/13 0035  NA 141 138  K 3.6* 4.0  CL 103 100  CO2 24 26  GLUCOSE 135* 128*  BUN 12 14  CREATININE 0.80 0.76  CALCIUM 9.0 8.4   PT/INR No results found for this basename: LABPROT, INR,  in the last 72 hours ABG No results found for this basename: PHART, PCO2, PO2, HCO3,  in the last 72 hours  Studies/Results: No results found.  Anti-infectives: Anti-infectives   Start     Dose/Rate Route Frequency Ordered Stop   08/11/13 2000  clindamycin (CLEOCIN) IVPB 900 mg     900 mg 100 mL/hr over 30 Minutes Intravenous 3 times per day 08/11/13 1649 08/11/13 2253   08/10/13 1446  clindamycin (CLEOCIN) IVPB 900 mg     900 mg 100 mL/hr over 30 Minutes Intravenous 60 min pre-op 08/10/13 1446 08/11/13 1215   08/10/13 1446  gentamicin (GARAMYCIN) 410 mg in dextrose 5 % 100 mL IVPB     5 mg/kg  82.6 kg 110.3 mL/hr over 60 Minutes Intravenous 60 min pre-op 08/10/13 1446 08/11/13 1206      Assessment/Plan: s/p Procedure(s): LAPAROSCOPIC RIGHT HEMICOLECTOMY (Right) Continue foley due to urinary output  monitoring Switch to dilaudid PCA OOB today. D/c foley in AM.    LOS: 1 day    [facility] 08/12/2013

## 2013-08-12 NOTE — Progress Notes (Signed)
INITIAL NUTRITION ASSESSMENT  DOCUMENTATION CODES Per approved criteria  -Not Applicable   INTERVENTION:  Resource Breeze PO TID, each supplement provides 250 kcal and 9 grams of protein  NUTRITION DIAGNOSIS: Inadequate oral intake related to altered GI function as evidenced by clear liquid diet.   Goal: Intake to meet >90% of estimated nutrition needs.  Monitor:  Diet advancement, PO intake, labs, weight trend.  Reason for Assessment: MST  38 y.o. female  Admitting Dx: Colon CA  ASSESSMENT: Patient admitted on 7/7 for laparoscopic right partial colectomy for colon CA.   She reports recent poor intake and 16 lb weight loss over the past 2 months. She is currently receiving a clear liquid diet, not very hungry, c/o altered taste. Nutrition focused physical exam completed.  No muscle or subcutaneous fat depletion noticed.   Height: Ht Readings from Last 1 Encounters:  08/11/13 5\' 6"  (1.676 m)    Weight: Wt Readings from Last 1 Encounters:  08/11/13 182 lb (82.555 kg)    Ideal Body Weight: 59.1 kg  % Ideal Body Weight: 140%  Wt Readings from Last 10 Encounters:  08/11/13 182 lb (82.555 kg)  08/11/13 182 lb (82.555 kg)  08/05/13 182 lb (82.555 kg)  07/17/13 187 lb (84.823 kg)  06/11/13 187 lb 11.2 oz (85.14 kg)  04/15/13 189 lb 8 oz (85.957 kg)  03/05/13 190 lb 12.8 oz (86.546 kg)  01/27/13 184 lb 0.6 oz (83.48 kg)  12/22/12 184 lb 1.3 oz (83.498 kg)  11/11/12 192 lb (87.091 kg)    Usual Body Weight: 198 lb 2 months ago per patient; 187 lb 2 months ago per review of usual weights in EMR.  % Usual Body Weight: 92-97%  BMI:  Body mass index is 29.39 kg/(m^2). Overweight  Estimated Nutritional Needs: Kcal: 1750-1950 Protein: 90-110 gm Fluid: 2 L  Skin: abdominal incision  Diet Order: Clear Liquid  EDUCATION NEEDS: -Education needs addressed   Intake/Output Summary (Last 24 hours) at 08/12/13 1424 Last data filed at 08/12/13 0609  Gross per 24  hour  Intake   1080 ml  Output    625 ml  Net    455 ml    Last BM: 7/6   Labs:   Recent Labs Lab 08/11/13 1031 08/12/13 0035  NA 141 138  K 3.6* 4.0  CL 103 100  CO2 24 26  BUN 12 14  CREATININE 0.80 0.76  CALCIUM 9.0 8.4  MG  --  1.7  PHOS  --  4.4  GLUCOSE 135* 128*    CBG (last 3)  No results found for this basename: GLUCAP,  in the last 72 hours  Scheduled Meds: . alvimopan  12 mg Oral BID  . enoxaparin (LOVENOX) injection  40 mg Subcutaneous Q24H  . HYDROmorphone PCA 0.3 mg/mL   Intravenous 6 times per day  . ketorolac  15 mg Intravenous 4 times per day  . lamoTRIgine  100 mg Oral Daily  . pantoprazole  40 mg Oral Q1200  . rOPINIRole  4 mg Oral QHS    Continuous Infusions: . dextrose 5 % and 0.45 % NaCl with KCl 20 mEq/L 100 mL/hr at 08/12/13 8850    Past Medical History  Diagnosis Date  . Restless leg   . Hyperlipidemia     "a long time ago" (08/11/2013)  . Allergic rhinitis   . Narcolepsy     Not had a problem in years  . Anemia, iron deficiency 01/28/2013  . GERD (gastroesophageal reflux disease)   .  IBS (irritable bowel syndrome)   . Anxiety   . Gastroparesis   . Allergy   . Seizures     last one 09/02/12  . Depression     off meds- "seems to be going good"  . Migraine     08/10/13- not in a long time  . Colon cancer     Past Surgical History  Procedure Laterality Date  . Appendectomy  2006  . Abdominal hysterectomy  2006  . Esophagogastroduodenoscopy  2011    normal  . Colonoscopy    . Colon surgery Right 08/11/2013  . Tubal ligation  2002    Molli Barrows, RD, LDN, [place] Pager [phone] After Hours Pager [phone]

## 2013-08-13 ENCOUNTER — Encounter (HOSPITAL_COMMUNITY): Payer: Self-pay | Admitting: General Surgery

## 2013-08-13 ENCOUNTER — Telehealth: Payer: Self-pay | Admitting: Genetic Counselor

## 2013-08-13 LAB — BASIC METABOLIC PANEL
ANION GAP: 9 (ref 5–15)
BUN: 4 mg/dL — ABNORMAL LOW (ref 6–23)
CO2: 28 meq/L (ref 19–32)
Calcium: 9 mg/dL (ref 8.4–10.5)
Chloride: 99 mEq/L (ref 96–112)
Creatinine, Ser: 0.82 mg/dL (ref 0.50–1.10)
GFR calc Af Amer: >90 mL/min (ref >90)
GFR calc non Af Amer: 89 mL/min — ABNORMAL LOW (ref >90)
Glucose, Bld: 115 mg/dL — ABNORMAL HIGH (ref 70–99)
Potassium: 4.2 mEq/L (ref 3.7–5.3)
Sodium: 136 mEq/L — ABNORMAL LOW (ref 137–147)

## 2013-08-13 LAB — CBC
HCT: 25.6 % — ABNORMAL LOW (ref 36.0–46.0)
Hemoglobin: 7.2 g/dL — ABNORMAL LOW (ref 12.0–15.0)
MCH: 19.5 pg — ABNORMAL LOW (ref 26.0–34.0)
MCHC: 28.1 g/dL — AB (ref 30.0–36.0)
MCV: 69.2 fL — ABNORMAL LOW (ref 78.0–100.0)
Platelets: 243 10*3/uL (ref 150–400)
RBC: 3.7 MIL/uL — ABNORMAL LOW (ref 3.87–5.11)
RDW: 17.2 % — ABNORMAL HIGH (ref 11.5–15.5)
WBC: 5 10*3/uL (ref 4.0–10.5)

## 2013-08-13 NOTE — Telephone Encounter (Signed)
S/W PATIENT HUSBAND AND GAVE GENETIC APPT FOR 07/31 @ 9 W/GENETIC COUNSELOR.  REFERRING DR. Ekalaka DX- COLON CA

## 2013-08-13 NOTE — Progress Notes (Signed)
Patient ID: [name], female   DOB: July 27, 1975, 38 y.o.   MRN: [id number] 2 Days Post-Op  Subjective: Pain control better with PCA dilaudid. No n/v.    Objective: Vital signs in last 24 hours: Temp:  [98.5 F (36.9 C)-100 F (37.8 C)] 99 F (37.2 C) (07/09 1033) Pulse Rate:  [71-85] 71 (07/09 1033) Resp:  [14-20] 20 (07/09 1200) BP: (108-127)/(57-63) 108/57 mmHg (07/09 1033) SpO2:  [96 %-100 %] 100 % (07/09 1200) FiO2 (%):  [2 %] 2 % (07/09 0535) Last BM Date: 08/10/13  Intake/Output from previous day: 07/08 0701 - 07/09 0700 In: -  Out: 2992 [Urine:3595] Intake/Output this shift: Total I/O In: -  Out: 175 [Urine:175]  General appearance: alert, cooperative and mild distress Resp: breathing comfortably GI: soft, approp tender.  Nondistended.  No drainage from wound.    Lab Results:   Recent Labs  08/12/13 0035 08/13/13 0319  WBC 6.2 5.0  HGB 7.1* 7.2*  HCT 25.0* 25.6*  PLT 259 243   BMET  Recent Labs  08/12/13 0035 08/13/13 0319  NA 138 136*  K 4.0 4.2  CL 100 99  CO2 26 28  GLUCOSE 128* 115*  BUN 14 4*  CREATININE 0.76 0.82  CALCIUM 8.4 9.0   PT/INR No results found for this basename: LABPROT, INR,  in the last 72 hours ABG No results found for this basename: PHART, PCO2, PO2, HCO3,  in the last 72 hours  Studies/Results: No results found.  Anti-infectives: Anti-infectives   Start     Dose/Rate Route Frequency Ordered Stop   08/11/13 2000  clindamycin (CLEOCIN) IVPB 900 mg     900 mg 100 mL/hr over 30 Minutes Intravenous 3 times per day 08/11/13 1649 08/11/13 2253   08/10/13 1446  clindamycin (CLEOCIN) IVPB 900 mg     900 mg 100 mL/hr over 30 Minutes Intravenous 60 min pre-op 08/10/13 1446 08/11/13 1215   08/10/13 1446  gentamicin (GARAMYCIN) 410 mg in dextrose 5 % 100 mL IVPB     5 mg/kg  82.6 kg 110.3 mL/hr over 60 Minutes Intravenous 60 min pre-op 08/10/13 1446 08/11/13 1206      Assessment/Plan: s/p Procedure(s): LAPAROSCOPIC  RIGHT HEMICOLECTOMY (Right) D/c foley Dilaudid PCA OOB today. IS/ambulate Advance to full liquids.     LOS: 2 days    [facility] 08/13/2013

## 2013-08-14 LAB — BASIC METABOLIC PANEL
Anion gap: 11 (ref 5–15)
BUN: 4 mg/dL — ABNORMAL LOW (ref 6–23)
CHLORIDE: 99 meq/L (ref 96–112)
CO2: 27 mEq/L (ref 19–32)
Calcium: 8.6 mg/dL (ref 8.4–10.5)
Creatinine, Ser: 0.75 mg/dL (ref 0.50–1.10)
GFR calc Af Amer: >90 mL/min (ref >90)
GLUCOSE: 125 mg/dL — AB (ref 70–99)
POTASSIUM: 4.2 meq/L (ref 3.7–5.3)
Sodium: 137 mEq/L (ref 137–147)

## 2013-08-14 LAB — CBC
HEMATOCRIT: 22.3 % — AB (ref 36.0–46.0)
HEMOGLOBIN: 6.3 g/dL — AB (ref 12.0–15.0)
MCH: 19.4 pg — AB (ref 26.0–34.0)
MCHC: 28.3 g/dL — ABNORMAL LOW (ref 30.0–36.0)
MCV: 68.6 fL — AB (ref 78.0–100.0)
Platelets: 222 10*3/uL (ref 150–400)
RBC: 3.25 MIL/uL — AB (ref 3.87–5.11)
RDW: 17.4 % — ABNORMAL HIGH (ref 11.5–15.5)
WBC: 5.3 10*3/uL (ref 4.0–10.5)

## 2013-08-14 NOTE — Progress Notes (Signed)
3 Days Post-Op  Subjective: Pt is doing well today.  Still no flatus.  Abd pain improved  Objective: Vital signs in last 24 hours: Temp:  [98.2 F (36.8 C)-99.6 F (37.6 C)] 98.6 F (37 C) (07/10 0606) Pulse Rate:  [69-87] 76 (07/10 0606) Resp:  [14-22] 21 (07/10 0606) BP: (102-116)/(57-62) 111/58 mmHg (07/10 0606) SpO2:  [97 %-100 %] 100 % (07/10 0606) Last BM Date: 08/10/13  Intake/Output from previous day: 07/09 0701 - 07/10 0700 In: 1200 [I.V.:1200] Out: 725 [Urine:725] Intake/Output this shift:    General appearance: alert and cooperative Cardio: regular rate and rhythm, S1, S2 normal, no murmur, click, rub or gallop GI: soft, approp ttp, hypoactive BS, ND, wound c/d/i  Lab Results:   Recent Labs  08/13/13 0319 08/14/13 0530  WBC 5.0 5.3  HGB 7.2* 6.3*  HCT 25.6* 22.3*  PLT 243 222   BMET  Recent Labs  08/13/13 0319 08/14/13 0530  NA 136* 137  K 4.2 4.2  CL 99 99  CO2 28 27  GLUCOSE 115* 125*  BUN 4* 4*  CREATININE 0.82 0.75  CALCIUM 9.0 8.6    Anti-infectives: Anti-infectives   Start     Dose/Rate Route Frequency Ordered Stop   08/11/13 2000  clindamycin (CLEOCIN) IVPB 900 mg     900 mg 100 mL/hr over 30 Minutes Intravenous 3 times per day 08/11/13 1649 08/11/13 2253   08/10/13 1446  clindamycin (CLEOCIN) IVPB 900 mg     900 mg 100 mL/hr over 30 Minutes Intravenous 60 min pre-op 08/10/13 1446 08/11/13 1215   08/10/13 1446  gentamicin (GARAMYCIN) 410 mg in dextrose 5 % 100 mL IVPB     5 mg/kg  82.6 kg 110.3 mL/hr over 60 Minutes Intravenous 60 min pre-op 08/10/13 1446 08/11/13 1206      Assessment/Plan: s/p Procedure(s): LAPAROSCOPIC RIGHT HEMICOLECTOMY (Right) Hct 22.3 today, likely dilutional.  Pt not symptomatic today.  May eventually require PRBC tx, will hold off today. Recheck HCT in AM Await bowel function Mobilize Pulm toilet   LOS: 3 days    Rosario Jacks., Anne Hahn 08/14/2013

## 2013-08-15 ENCOUNTER — Encounter (HOSPITAL_COMMUNITY): Payer: Self-pay | Admitting: Surgery

## 2013-08-15 LAB — BASIC METABOLIC PANEL
Anion gap: 10 (ref 5–15)
BUN: 3 mg/dL — ABNORMAL LOW (ref 6–23)
CO2: 28 meq/L (ref 19–32)
CREATININE: 0.68 mg/dL (ref 0.50–1.10)
Calcium: 8.5 mg/dL (ref 8.4–10.5)
Chloride: 103 mEq/L (ref 96–112)
GFR calc Af Amer: >90 mL/min (ref >90)
GFR calc non Af Amer: >90 mL/min (ref >90)
GLUCOSE: 139 mg/dL — AB (ref 70–99)
Potassium: 4.3 mEq/L (ref 3.7–5.3)
Sodium: 141 mEq/L (ref 137–147)

## 2013-08-15 LAB — CBC
HEMATOCRIT: 23.6 % — AB (ref 36.0–46.0)
Hemoglobin: 6.6 g/dL — CL (ref 12.0–15.0)
MCH: 19.6 pg — ABNORMAL LOW (ref 26.0–34.0)
MCHC: 28 g/dL — AB (ref 30.0–36.0)
MCV: 70 fL — AB (ref 78.0–100.0)
Platelets: 205 10*3/uL (ref 150–400)
RBC: 3.37 MIL/uL — ABNORMAL LOW (ref 3.87–5.11)
RDW: 17.4 % — AB (ref 11.5–15.5)
WBC: 3.5 10*3/uL — ABNORMAL LOW (ref 4.0–10.5)

## 2013-08-15 MED ORDER — PROMETHAZINE HCL 25 MG/ML IJ SOLN
12.5000 mg | Freq: Once | INTRAMUSCULAR | Status: AC
  Administered 2013-08-15: 12.5 mg via INTRAVENOUS
  Filled 2013-08-15: qty 1

## 2013-08-15 MED ORDER — HYDROMORPHONE HCL PF 1 MG/ML IJ SOLN: 1.0000 mg | INTRAMUSCULAR | Status: DC | PRN

## 2013-08-15 MED ORDER — MENTHOL 3 MG MT LOZG: 1.0000 | LOZENGE | OROMUCOSAL | Status: DC | PRN

## 2013-08-15 MED ORDER — LACTATED RINGERS IV BOLUS (SEPSIS): 1000.0000 mL | Freq: Three times a day (TID) | INTRAVENOUS | Status: AC | PRN

## 2013-08-15 MED ORDER — MAGIC MOUTHWASH: 15.0000 mL | Freq: Four times a day (QID) | ORAL | Status: DC | PRN

## 2013-08-15 MED ORDER — KETOROLAC TROMETHAMINE 30 MG/ML IJ SOLN
30.0000 mg | Freq: Once | INTRAMUSCULAR | Status: AC
  Administered 2013-08-15: 30 mg via INTRAVENOUS
  Filled 2013-08-15: qty 1

## 2013-08-15 MED ORDER — LIP MEDEX EX OINT
1.0000 "application " | TOPICAL_OINTMENT | Freq: Two times a day (BID) | CUTANEOUS | Status: DC
  Administered 2013-08-15 – 2013-08-19 (×9): 1 via TOPICAL
  Filled 2013-08-15: qty 7

## 2013-08-15 MED ORDER — SACCHAROMYCES BOULARDII 250 MG PO CAPS
250.0000 mg | ORAL_CAPSULE | Freq: Two times a day (BID) | ORAL | Status: DC
  Administered 2013-08-15 – 2013-08-19 (×9): 250 mg via ORAL
  Filled 2013-08-15 (×11): qty 1

## 2013-08-15 MED ORDER — WHITE PETROLATUM GEL
Status: AC
  Filled 2013-08-15: qty 5

## 2013-08-15 MED ORDER — KCL IN DEXTROSE-NACL 20-5-0.45 MEQ/L-%-% IV SOLN
INTRAVENOUS | Status: DC
  Administered 2013-08-15 – 2013-08-17 (×4): via INTRAVENOUS
  Administered 2013-08-18: 75 mL/h via INTRAVENOUS
  Filled 2013-08-15 (×9): qty 1000

## 2013-08-15 MED ORDER — PHENOL 1.4 % MT LIQD: 2.0000 | OROMUCOSAL | Status: DC | PRN

## 2013-08-15 MED ORDER — SODIUM CHLORIDE 0.9 % IJ SOLN
3.0000 mL | Freq: Two times a day (BID) | INTRAMUSCULAR | Status: DC
  Administered 2013-08-15 – 2013-08-18 (×5): 3 mL via INTRAVENOUS

## 2013-08-15 MED ORDER — SODIUM CHLORIDE 0.9 % IJ SOLN: 3.0000 mL | INTRAMUSCULAR | Status: DC | PRN

## 2013-08-15 MED ORDER — ACETAMINOPHEN 500 MG PO TABS
1000.0000 mg | ORAL_TABLET | Freq: Three times a day (TID) | ORAL | Status: DC
  Administered 2013-08-15 (×3): 1000 mg via ORAL
  Filled 2013-08-15 (×6): qty 2

## 2013-08-15 NOTE — Progress Notes (Signed)
Patient crying, requesting Toradol which she had this am;however it was d/c'd this am.  Pt stated that it helped her.  Patient had received 6 doses of 15mg  Toradol since it was ordered on 7/8.  Pt requesting MD be called to see if she can have it again.

## 2013-08-15 NOTE — Progress Notes (Signed)
[facility]  [address] [place], [place] [address] Phone: [phone] FAX: [phone]    [name] [id number] 02/14/1975  CARE TEAM:  PCP: [name]., NP  Outpatient Care Team: Patient Care Team: Emily Alar, NP as PCP - General  Inpatient Treatment Team: Treatment Team: Attending Provider: Stark Klein, MD; Registered Nurse: Emily Matters, RN; Registered Nurse: Emily Peeling, RN; Registered Nurse: Emily Nailer, RN; Technician: [name], NT; Dietitian: [name], RD; Registered Nurse: Emily Millers Muthomi, RN; Consulting Physician: Emily Napoleon, MD; Consulting Physician: Emily Ok, MD; Registered Nurse: Emily Mola, RN; Respiratory Therapist: Gonzella [name], RRT; Registered Nurse: Emily Kohut, RN; Registered Nurse: Emily Spinner, RN; Technician: [name], NT   Subjective:  Nauseated w lemon ice/jello.  O/w was tol PO Walked x4 in hallways RN & husband in room   Objective:  Vital signs:  Filed Vitals:   08/15/13 0150 08/15/13 0334 08/15/13 0601 08/15/13 0830  BP: 107/50  94/54   Pulse: 73  80   Temp: 98.8 F (37.1 C)  98.4 F (36.9 C)   TempSrc: Oral  Oral   Resp: 17 19 15 15   Height:      Weight:      SpO2: 100% 98% 100% 99%    Last BM Date: 08/14/13  Intake/Output   Yesterday:  07/10 0701 - 07/11 0700 In: 2867.7 [P.O.:600; I.V.:2267.7] Out: 1925 [Urine:1925] This shift:     Bowel function:  Flatus: y  BM: y  Drain: n/a  Physical Exam:  General: Pt awake/alert/oriented x4 in no acute distress Eyes: PERRL, normal EOM.  Sclera clear.  No icterus Neuro: CN II-XII intact w/o focal sensory/motor deficits. Lymph: No head/neck/groin lymphadenopathy Psych:  A little depressed but consolable.  No delerium/psychosis/paranoia HENT: Normocephalic, Mucus membranes moist.  No thrush Neck: Supple, No tracheal deviation Chest:  No chest wall pain w good excursion CV:  Pulses intact.  Regular rhythm MS: Normal AROM mjr joints.  No obvious deformity Abdomen: Soft.  Obese.  Nondistended.  Incisions c/d/i.  Mildly tender at incisions only.  No evidence of peritonitis.  No incarcerated hernias. Ext:  SCDs BLE.  No mjr edema.  No cyanosis Skin: No petechiae / purpura   Problem List:   Principal Problem:   Cancer of ascending colon s/p right colectomy 08/11/2013 Active Problems:   RESTLESS LEG SYNDROME   GERD   Gastroparesis   IBS   ADD (attention deficit disorder)   Fatigue   Anemia, iron deficiency   Assessment  [name]  38 y.o. female  4 Days Post-Op  Procedure(s): Laparoscopic Right Hemicolectomy Procedure Note  Indications: This patient presents for a laparoscopic partial colectomy for colon cancer  Pre-operative Diagnosis:  Colon cancer  Post-operative Diagnosis:  Same  Surgeon: Temple:  -liquids.  No carbonatiobn / scid/ lemon -follow Hgb - [name] for now.  Transfuse if SBP stays low or Hgb drops more - d/w husband again -improve non-narcotic pain control -f/u path -RLS - requip -ADHD - lamactal -VTE prophylaxis- SCDs, etc.  Hold lovenox if Hgb drops more - [name] for now -mobilize as tolerated to help recovery  [name], M.D., F.A.C.S. Gastrointestinal and Minimally Invasive Surgery [facility] [address] [place], [place] [address] [phone] Main / Paging   08/15/2013   Results:   Labs: Results for orders placed  during the hospital encounter of 08/11/13 (from the past 48 hour(s))  BASIC METABOLIC PANEL     Status: Abnormal   Collection Time    08/14/13  5:30 AM      Result Value Ref Range   Sodium 137  137 - 147 mEq/L   Potassium 4.2  3.7 - 5.3 mEq/L   Chloride 99  96 - 112 mEq/L   CO2 27  19 - 32 mEq/L   Glucose, Bld 125 (*) 70 - 99 mg/dL   BUN 4 (*) 6 - 23 mg/dL   Creatinine, Ser 0.75  0.50 - 1.10  mg/dL   Calcium 8.6  8.4 - 10.5 mg/dL   GFR calc non Af Amer >90  >90 mL/min   GFR calc Af Amer >90  >90 mL/min   Comment: (NOTE)     The eGFR has been calculated using the CKD EPI equation.     This calculation has not been validated in all clinical situations.     eGFR's persistently <90 mL/min signify possible Chronic Kidney     Disease.   Anion gap 11  5 - 15  CBC     Status: Abnormal   Collection Time    08/14/13  5:30 AM      Result Value Ref Range   WBC 5.3  4.0 - 10.5 K/uL   RBC 3.25 (*) 3.87 - 5.11 MIL/uL   Hemoglobin 6.3 (*) 12.0 - 15.0 g/dL   Comment: REPEATED TO VERIFY     CRITICAL RESULT CALLED TO, READ BACK BY AND VERIFIED WITH:     Emily Campbell RN [id number] GREEN R   HCT 22.3 (*) 36.0 - 46.0 %   MCV 68.6 (*) 78.0 - 100.0 fL   MCH 19.4 (*) 26.0 - 34.0 pg   MCHC 28.3 (*) 30.0 - 36.0 g/dL   RDW 17.4 (*) 11.5 - 15.5 %   Platelets 222  150 - 400 K/uL  BASIC METABOLIC PANEL     Status: Abnormal   Collection Time    08/15/13  3:51 AM      Result Value Ref Range   Sodium 141  137 - 147 mEq/L   Potassium 4.3  3.7 - 5.3 mEq/L   Chloride 103  96 - 112 mEq/L   CO2 28  19 - 32 mEq/L   Glucose, Bld 139 (*) 70 - 99 mg/dL   BUN 3 (*) 6 - 23 mg/dL   Creatinine, Ser 0.68  0.50 - 1.10 mg/dL   Calcium 8.5  8.4 - 10.5 mg/dL   GFR calc non Af Amer >90  >90 mL/min   GFR calc Af Amer >90  >90 mL/min   Comment: (NOTE)     The eGFR has been calculated using the CKD EPI equation.     This calculation has not been validated in all clinical situations.     eGFR's persistently <90 mL/min signify possible Chronic Kidney     Disease.   Anion gap 10  5 - 15  CBC     Status: Abnormal   Collection Time    08/15/13  3:51 AM      Result Value Ref Range   WBC 3.5 (*) 4.0 - 10.5 K/uL   RBC 3.37 (*) 3.87 - 5.11 MIL/uL   Hemoglobin 6.6 (*) 12.0 - 15.0 g/dL   Comment: REPEATED TO VERIFY     CRITICAL VALUE NOTED.  VALUE IS CONSISTENT WITH PREVIOUSLY REPORTED AND CALLED VALUE.   HCT 23.6  (*)  36.0 - 46.0 %   MCV 70.0 (*) 78.0 - 100.0 fL   MCH 19.6 (*) 26.0 - 34.0 pg   MCHC 28.0 (*) 30.0 - 36.0 g/dL   RDW 17.4 (*) 11.5 - 15.5 %   Platelets 205  150 - 400 K/uL    Imaging / Studies: No results found.  Medications / Allergies: per chart  Antibiotics: Anti-infectives   Start     Dose/Rate Route Frequency Ordered Stop   08/11/13 2000  clindamycin (CLEOCIN) IVPB 900 mg     900 mg 100 mL/hr over 30 Minutes Intravenous 3 times per day 08/11/13 1649 08/11/13 2253   08/10/13 1446  clindamycin (CLEOCIN) IVPB 900 mg     900 mg 100 mL/hr over 30 Minutes Intravenous 60 min pre-op 08/10/13 1446 08/11/13 1215   08/10/13 1446  gentamicin (GARAMYCIN) 410 mg in dextrose 5 % 100 mL IVPB     5 mg/kg  82.6 kg 110.3 mL/hr over 60 Minutes Intravenous 60 min pre-op 08/10/13 1446 08/11/13 1206       Note: This dictation was prepared with voice recognition software technology. In this process, transcriptional errors may occur.  Attempts are made to proofread & provide accurate documentation.  Any errors are unintentional.

## 2013-08-16 DIAGNOSIS — Z9289 Personal history of other medical treatment: Secondary | ICD-10-CM

## 2013-08-16 HISTORY — DX: Personal history of other medical treatment: Z92.89

## 2013-08-16 LAB — CREATININE, SERUM
CREATININE: 0.75 mg/dL (ref 0.50–1.10)
GFR calc Af Amer: >90 mL/min (ref >90)
GFR calc non Af Amer: >90 mL/min (ref >90)

## 2013-08-16 LAB — HEMOGLOBIN: Hemoglobin: 6 g/dL — CL (ref 12.0–15.0)

## 2013-08-16 LAB — PREPARE RBC (CROSSMATCH)

## 2013-08-16 LAB — POTASSIUM: POTASSIUM: 4 meq/L (ref 3.7–5.3)

## 2013-08-16 MED ORDER — HYDROMORPHONE HCL PF 1 MG/ML IJ SOLN
2.0000 mg | INTRAMUSCULAR | Status: DC | PRN
  Administered 2013-08-18 (×3): 2 mg via INTRAVENOUS
  Filled 2013-08-16 (×3): qty 2

## 2013-08-16 MED ORDER — DIPHENHYDRAMINE HCL 50 MG/ML IJ SOLN
25.0000 mg | Freq: Once | INTRAMUSCULAR | Status: AC
  Administered 2013-08-16: 25 mg via INTRAVENOUS
  Filled 2013-08-16: qty 1

## 2013-08-16 MED ORDER — ACETAMINOPHEN 500 MG PO TABS
1000.0000 mg | ORAL_TABLET | Freq: Four times a day (QID) | ORAL | Status: DC
  Administered 2013-08-16 – 2013-08-19 (×4): 1000 mg via ORAL
  Filled 2013-08-16 (×15): qty 2

## 2013-08-16 MED ORDER — FUROSEMIDE 10 MG/ML IJ SOLN
20.0000 mg | Freq: Once | INTRAMUSCULAR | Status: AC
  Administered 2013-08-16: 20 mg via INTRAVENOUS
  Filled 2013-08-16: qty 2

## 2013-08-16 NOTE — Progress Notes (Signed)
[facility]  [address] [place], [place] [address] Phone: [phone] FAX: [phone]    [name] [id number] 02-24-1975  CARE TEAM:  PCP: Nance Pear., NP  Outpatient Care Team: Patient Care Team: Debbrah Alar, NP as PCP - General  Inpatient Treatment Team: Treatment Team: Attending Provider: Ralene Ok, MD; Registered Nurse: Donzetta Matters, RN; Registered Nurse: Candida Peeling, RN; Registered Nurse: Suzan Nailer, RN; Technician: Jeanene Erb, NT; Dietitian: Dalene Carrow, RD; Registered Nurse: Olga Millers Muthomi, RN; Consulting Physician: Volanda Napoleon, MD; Consulting Physician: Ralene Ok, MD; Registered Nurse: Wendall Mola, RN; Registered Nurse: Donzetta Kohut, RN; Registered Nurse: Ainsley Spinner, RN; Technician: Benetta Spar, NT; Technician: Tina Griffiths, NT; Registered Nurse: Colin Broach, RN; Respiratory Therapist: Miquel Dunn, RRT   Subjective:  [name] clears PO Walked x4 in hallways Got toradol for pain RN & husband in room   Objective:  Vital signs:  Filed Vitals:   08/16/13 0119 08/16/13 0325 08/16/13 0534 08/16/13 0734  BP: 95/56  95/57   Pulse: 69  65   Temp: 98.1 F (36.7 C)  97.9 F (36.6 C)   TempSrc: Oral  Oral   Resp: _0 Height:      Weight:      SpO2: 100% 100% 100% 100%    Last BM Date: 08/15/13  Intake/Output   Yesterday:  07/11 0701 - 07/12 0700 In: 2156.3 [P.O.:960; I.V.:1196.3] Out: 1450 [Urine:1450] This shift:     Bowel function:  Flatus: y  BM: y  Drain: n/a  Physical Exam:  General: Pt awake/alert/oriented x4 in no acute distress Eyes: PERRL, normal EOM.  Sclera clear.  No icterus Neuro: CN II-XII intact w/o focal sensory/motor deficits. Lymph: No head/neck/groin lymphadenopathy Psych:  A little depressed but consolable.  No delerium/psychosis/paranoia HENT: Normocephalic, Mucus  membranes moist.  No thrush Neck: Supple, No tracheal deviation Chest: No chest wall pain w good excursion CV:  Pulses intact.  Regular rhythm MS: Normal AROM mjr joints.  No obvious deformity Abdomen: Soft.  Obese.  Nondistended.  Incisions c/d/i.  Mildly tender at incisions only.  No evidence of peritonitis.  No incarcerated hernias. Ext:  SCDs BLE.  No mjr edema.  No cyanosis Skin: No petechiae / purpura   Problem List:   Principal Problem:   Cancer of ascending colon s/p right colectomy 08/11/2013 Active Problems:   RESTLESS LEG SYNDROME   GERD   Gastroparesis   IBS   ADD (attention deficit disorder)   Fatigue   Anemia, iron deficiency   Assessment  [name]  38 y.o. female  5 Days Post-Op  Procedure(s): Laparoscopic Right Hemicolectomy Procedure Note  Indications: This patient presents for a laparoscopic partial colectomy for colon cancer  Pre-operative Diagnosis:  Colon cancer  Post-operative Diagnosis:  Same  Surgeon: Lura Em w lower Hgb & low BP  Plan:  -Full liquids.   -Transfuse 2U PRBCs & follow Hgb - d/w husband again -improve non-narcotic pain control - inc tylenol PO.  No NSAIDs -f/u path -RLS - requip -ADHD - lamactal -VTE prophylaxis- SCDs, etc.  Hold lovenox since Hgb dropeped more -mobilize as tolerated to help recovery  Adin Hector, M.D., F.A.C.S. Gastrointestinal and Minimally Invasive Surgery [facility] [address], [place], [place] [address] [phone] Main / Paging   08/16/2013   Results:  Labs: Results for orders placed during the hospital encounter of 08/11/13 (from the past 48 hour(s))  BASIC METABOLIC PANEL     Status: Abnormal   Collection Time    08/15/13  3:51 AM      Result Value Ref Range   Sodium 141  137 - 147 mEq/L   Potassium 4.3  3.7 - 5.3 mEq/L   Chloride 103  96 - 112 mEq/L   CO2 28  19 - 32 mEq/L   Glucose, Bld 139 (*) 70 - 99 mg/dL   BUN 3  (*) 6 - 23 mg/dL   Creatinine, Ser 0.68  0.50 - 1.10 mg/dL   Calcium 8.5  8.4 - 10.5 mg/dL   GFR calc non Af Amer >90  >90 mL/min   GFR calc Af Amer >90  >90 mL/min   Comment: (NOTE)     The eGFR has been calculated using the CKD EPI equation.     This calculation has not been validated in all clinical situations.     eGFR's persistently <90 mL/min signify possible Chronic Kidney     Disease.   Anion gap 10  5 - 15  CBC     Status: Abnormal   Collection Time    08/15/13  3:51 AM      Result Value Ref Range   WBC 3.5 (*) 4.0 - 10.5 K/uL   RBC 3.37 (*) 3.87 - 5.11 MIL/uL   Hemoglobin 6.6 (*) 12.0 - 15.0 g/dL   Comment: REPEATED TO VERIFY     CRITICAL VALUE NOTED.  VALUE IS CONSISTENT WITH PREVIOUSLY REPORTED AND CALLED VALUE.   HCT 23.6 (*) 36.0 - 46.0 %   MCV 70.0 (*) 78.0 - 100.0 fL   MCH 19.6 (*) 26.0 - 34.0 pg   MCHC 28.0 (*) 30.0 - 36.0 g/dL   RDW 17.4 (*) 11.5 - 15.5 %   Platelets 205  150 - 400 K/uL  HEMOGLOBIN     Status: Abnormal   Collection Time    08/16/13  5:30 AM      Result Value Ref Range   Hemoglobin 6.0 (*) 12.0 - 15.0 g/dL   Comment: REPEATED TO VERIFY     CRITICAL VALUE NOTED.  VALUE IS CONSISTENT WITH PREVIOUSLY REPORTED AND CALLED VALUE.  POTASSIUM     Status: None   Collection Time    08/16/13  5:30 AM      Result Value Ref Range   Potassium 4.0  3.7 - 5.3 mEq/L  CREATININE, SERUM     Status: None   Collection Time    08/16/13  5:30 AM      Result Value Ref Range   Creatinine, Ser 0.75  0.50 - 1.10 mg/dL   GFR calc non Af Amer >90  >90 mL/min   GFR calc Af Amer >90  >90 mL/min   Comment: (NOTE)     The eGFR has been calculated using the CKD EPI equation.     This calculation has not been validated in all clinical situations.     eGFR's persistently <90 mL/min signify possible Chronic Kidney     Disease.    Imaging / Studies: No results found.  Medications / Allergies: per chart  Antibiotics: Anti-infectives   Start     Dose/Rate Route  Frequency Ordered Stop   08/11/13 2000  clindamycin (CLEOCIN) IVPB 900 mg     900 mg 100 mL/hr over 30 Minutes Intravenous 3 times per day 08/11/13 1649 08/11/13 2253  08/10/13 1446  clindamycin (CLEOCIN) IVPB 900 mg     900 mg 100 mL/hr over 30 Minutes Intravenous 60 min pre-op 08/10/13 1446 08/11/13 1215   08/10/13 1446  gentamicin (GARAMYCIN) 410 mg in dextrose 5 % 100 mL IVPB     5 mg/kg  82.6 kg 110.3 mL/hr over 60 Minutes Intravenous 60 min pre-op 08/10/13 1446 08/11/13 1206       Note: This dictation was prepared with voice recognition software technology. In this process, transcriptional errors may occur.  Attempts are made to proofread & provide accurate documentation.  Any errors are unintentional.

## 2013-08-17 LAB — TYPE AND SCREEN
ABO/RH(D): A POS
Antibody Screen: NEGATIVE
UNIT DIVISION: 0
Unit division: 0

## 2013-08-17 LAB — CREATININE, SERUM
Creatinine, Ser: 0.85 mg/dL (ref 0.50–1.10)
GFR calc Af Amer: >90 mL/min (ref >90)
GFR calc non Af Amer: 85 mL/min — ABNORMAL LOW (ref >90)

## 2013-08-17 LAB — POTASSIUM: Potassium: 4.2 mEq/L (ref 3.7–5.3)

## 2013-08-17 LAB — HEMOGLOBIN: HEMOGLOBIN: 8.7 g/dL — AB (ref 12.0–15.0)

## 2013-08-17 MED ORDER — OXYCODONE-ACETAMINOPHEN 5-325 MG PO TABS
1.0000 | ORAL_TABLET | ORAL | Status: DC | PRN
  Administered 2013-08-17 – 2013-08-19 (×10): 2 via ORAL
  Filled 2013-08-17 (×10): qty 2

## 2013-08-17 NOTE — Progress Notes (Signed)
6 Days Post-Op  Subjective: Pt doing well.  Tol PO w/o n/v. Pt with some loose stool after PO. Ambulation well  Objective: Vital signs in last 24 hours: Temp:  [97.9 F (36.6 C)-98.8 F (37.1 C)] 97.9 F (36.6 C) (07/13 1512) Pulse Rate:  [58-78] 59 (07/13 1512) Resp:  [12-24] 16 (07/13 1512) BP: (98-126)/(49-72) 103/71 mmHg (07/13 1512) SpO2:  [98 %-100 %] 99 % (07/13 1512) Last BM Date: 08/16/13  Intake/Output from previous day: 07/12 0701 - 07/13 0700 In: 3699.5 [P.O.:480; I.V.:2601.5; Blood:618] Out: 2950 [Urine:2950] Intake/Output this shift: Total I/O In: 360 [P.O.:360] Out: 750 [Urine:750]  General appearance: alert and cooperative GI: s/nt/nd/ wound c/d/i  Lab Results:   Recent Labs  08/15/13 0351 08/16/13 0530 08/17/13 0513  WBC 3.5*  --   --   HGB 6.6* 6.0* 8.7*  HCT 23.6*  --   --   PLT 205  --   --    BMET  Recent Labs  08/15/13 0351 08/16/13 0530 08/17/13 0513  NA 141  --   --   K 4.3 4.0 4.2  CL 103  --   --   CO2 28  --   --   GLUCOSE 139*  --   --   BUN 3*  --   --   CREATININE 0.68 0.75 0.85  CALCIUM 8.5  --   --    PT/INR No results found for this basename: LABPROT, INR,  in the last 72 hours ABG No results found for this basename: PHART, PCO2, PO2, HCO3,  in the last 72 hours  Studies/Results: No results found.  Anti-infectives: Anti-infectives   Start     Dose/Rate Route Frequency Ordered Stop   08/11/13 2000  clindamycin (CLEOCIN) IVPB 900 mg     900 mg 100 mL/hr over 30 Minutes Intravenous 3 times per day 08/11/13 1649 08/11/13 2253   08/10/13 1446  clindamycin (CLEOCIN) IVPB 900 mg     900 mg 100 mL/hr over 30 Minutes Intravenous 60 min pre-op 08/10/13 1446 08/11/13 1215   08/10/13 1446  gentamicin (GARAMYCIN) 410 mg in dextrose 5 % 100 mL IVPB     5 mg/kg  82.6 kg 110.3 mL/hr over 60 Minutes Intravenous 60 min pre-op 08/10/13 1446 08/11/13 1206      Assessment/Plan: s/p Procedure(s): LAPAROSCOPIC RIGHT  HEMICOLECTOMY (Right) Advance diet Plan on DC in AM if con't to do well adn tol PO. Encourage ambulation  LOS: 6 days    Rosario Jacks., [facility] 08/17/2013

## 2013-08-18 LAB — CREATININE, SERUM
Creatinine, Ser: 0.76 mg/dL (ref 0.50–1.10)
GFR calc non Af Amer: >90 mL/min (ref >90)

## 2013-08-18 LAB — POTASSIUM: POTASSIUM: 4 meq/L (ref 3.7–5.3)

## 2013-08-18 LAB — HEMOGLOBIN: Hemoglobin: 9 g/dL — ABNORMAL LOW (ref 12.0–15.0)

## 2013-08-18 MED ORDER — OXYCODONE-ACETAMINOPHEN 5-325 MG PO TABS: 1.0000 | ORAL_TABLET | ORAL | Status: DC | PRN

## 2013-08-18 NOTE — Progress Notes (Signed)
I have seen and examined the pt and agree with PA-Osborne's progress note. Pt still with nausea and min PO.  States she is feeling better overall Will plan DC in AM.

## 2013-08-18 NOTE — Progress Notes (Signed)
Patient ID: [name], female   DOB: [birth year], 38 y.o.   MRN: [id number] 7 Days Post-Op  Subjective: Pt c/o nausea.  Nausea with food and her meds.  She is ambulating and still passing flatus.  Had some diarrhea yesterday.  Slight increase in pain today.  Objective: Vital signs in last 24 hours: Temp:  [97.9 F (36.6 C)-98.3 F (36.8 C)] 97.9 F (36.6 C) (07/14 0549) Pulse Rate:  [57-62] 62 (07/14 0549) Resp:  [16] 16 (07/14 0549) BP: (103-123)/(62-71) 105/62 mmHg (07/14 0549) SpO2:  [98 %-99 %] 98 % (07/14 0549) Last BM Date: 08/17/13  Intake/Output from previous day: 07/13 0701 - 07/14 0700 In: 1155 [P.O.:480; I.V.:675] Out: 2000 [Urine:2000] Intake/Output this shift:    PE: Abd: soft, appropriately tender, +BS, ND, incision c/d/i  Lab Results:   Recent Labs  08/17/13 0513 08/18/13 0451  HGB 8.7* 9.0*   BMET  Recent Labs  08/17/13 0513 08/18/13 0451  K 4.2 4.0  CREATININE 0.85 0.76   PT/INR No results found for this basename: LABPROT, INR,  in the last 72 hours CMP     Component Value Date/Time   NA 141 08/15/2013 0351   K 4.0 08/18/2013 0451   CL 103 08/15/2013 0351   CO2 28 08/15/2013 0351   GLUCOSE 139* 08/15/2013 0351   BUN 3* 08/15/2013 0351   CREATININE 0.76 08/18/2013 0451   CREATININE 0.69 01/27/2013 1439   CALCIUM 8.5 08/15/2013 0351   PROT 6.8 06/09/2013 0935   ALBUMIN 3.7 06/09/2013 0935   AST 24 06/09/2013 0935   ALT 25 06/09/2013 0935   ALKPHOS 83 06/09/2013 0935   BILITOT 0.6 06/09/2013 0935   GFRNONAA >90 08/18/2013 0451   GFRNONAA >89 11/21/2011 1403   GFRAA >90 08/18/2013 0451   GFRAA >89 11/21/2011 1403   Lipase     Component Value Date/Time   LIPASE 24 06/09/2013 0935       Studies/Results: No results found.  Anti-infectives: Anti-infectives   Start     Dose/Rate Route Frequency Ordered Stop   08/11/13 2000  clindamycin (CLEOCIN) IVPB 900 mg     900 mg 100 mL/hr over 30 Minutes Intravenous 3 times per day 08/11/13 1649 08/11/13  2253   08/10/13 1446  clindamycin (CLEOCIN) IVPB 900 mg     900 mg 100 mL/hr over 30 Minutes Intravenous 60 min pre-op 08/10/13 1446 08/11/13 1215   08/10/13 1446  gentamicin (GARAMYCIN) 410 mg in dextrose 5 % 100 mL IVPB     5 mg/kg  82.6 kg 110.3 mL/hr over 60 Minutes Intravenous 60 min pre-op 08/10/13 1446 08/11/13 1206       Assessment/Plan  1. POD 7, s/p right colectomy   Plan: 1. Pt c/o some increase in nausea.  She is still passing flatus and had a BM yesterday.  She was nervous about going home.  May go home later today after Dr. Rosendo Gros evaluates her.  LOS: 7 days    Daruis Swaim E 08/18/2013, 1:51 PM Pager: [phone]

## 2013-08-19 LAB — CREATININE, SERUM: CREATININE: 0.76 mg/dL (ref 0.50–1.10)

## 2013-08-19 LAB — HEMOGLOBIN: HEMOGLOBIN: 9.2 g/dL — AB (ref 12.0–15.0)

## 2013-08-19 LAB — POTASSIUM: Potassium: 4.2 mEq/L (ref 3.7–5.3)

## 2013-08-19 MED ORDER — OXYCODONE-ACETAMINOPHEN 5-325 MG PO TABS: 1.0000 | ORAL_TABLET | ORAL | Status: DC | PRN

## 2013-08-19 NOTE — Progress Notes (Signed)
Patient received discharge instructions with verbal understanding. Patient discharged to home with family and belongings.

## 2013-08-20 ENCOUNTER — Telehealth (INDEPENDENT_AMBULATORY_CARE_PROVIDER_SITE_OTHER): Payer: Self-pay

## 2013-08-20 ENCOUNTER — Other Ambulatory Visit: Payer: Self-pay | Admitting: *Deleted

## 2013-08-20 ENCOUNTER — Encounter (INDEPENDENT_AMBULATORY_CARE_PROVIDER_SITE_OTHER): Payer: Self-pay | Admitting: General Surgery

## 2013-08-20 MED ORDER — LAMOTRIGINE 100 MG PO TABS: 100.0000 mg | ORAL_TABLET | Freq: Two times a day (BID) | ORAL | Status: DC

## 2013-08-20 NOTE — Telephone Encounter (Signed)
Pt is post op hemicolectomy on 08/11/13 by Dr. Barry Dienes.  She was on IV pain medication in the hospital, but was having problems with access so was switched to oral tablets and d/c'd home.  Since then she has developed all over body itching.  She has been taking the Benadryl with little relief.  She did say that the red rash has begun to disappear.  I told her that most likely it would resolve in another day or two once the medication was completely out of her system.  I recommended she take 50mg  Benadryl q 6 hours and see if this helps.  If no relief by Friday mid afternoon call us back.  Pt understood instructions and will comply.

## 2013-08-20 NOTE — Telephone Encounter (Signed)
No notes in this encounter. 

## 2013-08-21 ENCOUNTER — Encounter (HOSPITAL_COMMUNITY): Payer: Self-pay

## 2013-08-24 ENCOUNTER — Encounter: Payer: Self-pay | Admitting: Family

## 2013-08-24 MED ORDER — ZOLPIDEM TARTRATE 5 MG PO TABS: 5.0000 mg | ORAL_TABLET | Freq: Every evening | ORAL | Status: DC | PRN

## 2013-08-26 ENCOUNTER — Telehealth: Payer: Self-pay | Admitting: *Deleted

## 2013-08-26 NOTE — Telephone Encounter (Signed)
Spoke with patient and conformed appointment with Dr. Benay Spice on 09/03/13.  Contact names, numbers, and directions were provided.

## 2013-08-28 ENCOUNTER — Encounter (INDEPENDENT_AMBULATORY_CARE_PROVIDER_SITE_OTHER): Payer: Self-pay | Admitting: General Surgery

## 2013-08-28 ENCOUNTER — Ambulatory Visit (INDEPENDENT_AMBULATORY_CARE_PROVIDER_SITE_OTHER): Payer: No Typology Code available for payment source | Admitting: General Surgery

## 2013-08-28 VITALS — BP 120/78 | HR 72 | Resp 18 | Ht 66.0 in | Wt 171.0 lb

## 2013-08-28 DIAGNOSIS — C182 Malignant neoplasm of ascending colon: Secondary | ICD-10-CM

## 2013-08-28 NOTE — Addendum Note (Signed)
Addended by: Stark Klein on: 08/28/2013 01:29 PM   Modules accepted: Orders

## 2013-08-28 NOTE — Assessment & Plan Note (Signed)
We will schedule port a cath.    Reviewed risks and benefits.    Follow up with port a cath.  Itching:  I do not want to give oral steroids at this point since it is improving slowly.  If she still has itching when I place her port, I will give her a short course of steroids.

## 2013-08-28 NOTE — Discharge Summary (Signed)
Physician Discharge Summary  Patient ID: [name] MRN: [id number] DOB/AGE: 10-21-1975 38 y.o.  Admit date: 08/11/2013 Discharge date: 08/28/2013  Admission Diagnoses: Colon cancer GERD Gastroparesis IBS ADD RLS Anemia  Discharge Diagnoses:  Principal Problem:   Cancer of ascending colon s/p right colectomy 08/11/2013 Active Problems:   RESTLESS LEG SYNDROME   GERD   Gastroparesis   IBS   ADD (attention deficit disorder)   Fatigue   Anemia, iron deficiency   Discharged Condition: stable  Hospital Course:  Pt was admitted to the floor following her laparoscopic right hemicolectomy.  She had some initial issues with pain control that improved with conversion to dilaudid PCA from morphine PCA.  However, she then had issues with oversedation and breathing, and had PCA discontinued.  She was able to tolerate intermittent injections.  She was able to void with foley removal.  She was able to slowly have her diet advanced.  She was ambulatory with assistance.  She developed itching and a rash after taking a shower, but not in any common distribution to attribute to detergent, prep, soap, meds, etc. She took benadryl and hydrocortisone cream for this.    Consults: None  Significant Diagnostic Studies: labs: see epic  Treatments: surgery: lap right hemicolectomy  Discharge Exam: Blood pressure 117/71, pulse 61, temperature 98.5 F (36.9 C), temperature source Oral, resp. rate 16, height 5\' 6"  (1.676 m), weight 182 lb (82.555 kg), SpO2 97.00%. General appearance: alert, cooperative and no distress Resp: breathing comfortably GI: soft, approp tender, no evidence of infection  Disposition: 01-Home or Self Care  Discharge Instructions   Diet - low sodium heart healthy    Complete by:  As directed      Increase activity slowly    Complete by:  As directed             Medication List         omeprazole 20 MG tablet  Commonly known as:  PRILOSEC OTC  Take 20 mg by mouth  daily.     ondansetron 4 MG tablet  Commonly known as:  ZOFRAN  Take 4 mg by mouth every 6 (six) hours as needed for nausea or vomiting.     rOPINIRole 4 MG tablet  Commonly known as:  REQUIP  Take 1 tablet (4 mg total) by mouth at bedtime.           Follow-up Information   Follow up with [facility], MD In 2 weeks.   Specialty:  General Surgery   Contact information:   [address] [place] [address] [phone]       Signed: Stark Klein 08/28/2013, 3:43 PM

## 2013-08-28 NOTE — Patient Instructions (Signed)
Implanted Port Insertion An implanted port is a central line that has a round shape and is placed under the skin. It is used as a long-term IV access for:   Medicines, such as chemotherapy.   Fluids.   Liquid nutrition, such as total parenteral nutrition (TPN).   Blood samples.  LET YOUR HEALTH CARE PROVIDER KNOW ABOUT:  Allergies to food or medicine.   Medicines taken, including vitamins, herbs, eye drops, creams, and over-the-counter medicines.   Any allergies to heparin.  Use of steroids (by mouth or creams).   Previous problems with anesthetics or numbing medicines.   History of bleeding problems or blood clots.   Previous surgery.   Other health problems, including diabetes and kidney problems.   Possibility of pregnancy, if this applies. RISKS AND COMPLICATIONS Generally, this is a safe procedure. However, as with any procedure, problems can occur. Possible problems include:  Damage to the blood vessel, bruising, or bleeding at the puncture site.   Infection.  Blood clot in the vessel that the port is in.  Breakdown of the skin over your port.  Very rarely a person may develop a condition called a pneumothorax, a collection of air in the chest that may cause one of the lungs to collapse. The placement of these catheters with the appropriate imaging guidance significantly decreases the risk of a pneumothorax.  BEFORE THE PROCEDURE   Your health care provider may want you to have blood tests. These tests can help tell how well your kidneys and liver are working. They can also show how well your blood clots.   If you take blood thinners (anticoagulant medicines), ask your health care provider when you should stop taking them.   Make arrangements for someone to drive you home. This is necessary if you have been sedated for your procedure.  PROCEDURE  Port insertion usually takes about 30-45 minutes.   An IV needle will be inserted in your arm.  Medicine for pain and medicine to help relax you (sedative) will flow directly into your body through this needle.   You will lie on an exam table, and you will be connected to monitors to keep track of your heart rate, blood pressure, and breathing throughout the procedure.  An oxygen monitoring device may be attached to your finger. Oxygen will be given.   Everything will be kept as germ free (sterile) as possible during the procedure. The skin near the point of the incision will be cleansed with antiseptic, and the area will be draped with sterile towels. The skin and deeper tissues over the port area will be made numb with a local anesthetic.  Two small cuts (incisions) will be made in the skin to insert the port. One will be made in the neck to obtain access to the vein where the catheter will lie.   Because the port reservoir will be placed under the skin, a small skin incision will be made in the upper chest, and a small pocket for the port will be made under the skin. The catheter that will be connected to the port tunnels to a large central vein in the chest. A small, raised area will remain on your body at the site of the reservoir when the procedure is complete.  The port placement will be done under imaging guidance to ensure the proper placement.  The reservoir has a silicone covering that can be punctured with a special needle.   The port will be flushed with normal   saline, and blood will be drawn to make sure it is working properly.  There will be nothing remaining outside the skin when the procedure is finished.   Incisions will be held together by stitches, surgical glue, or a special tape. AFTER THE PROCEDURE  You will stay in a recovery area until the anesthesia has worn off. Your blood pressure and pulse will be checked.  A final chest X-ray will be taken to check the placement of the port and to ensure that there is no injury to your lung.

## 2013-08-28 NOTE — Progress Notes (Signed)
HISTORY: Pt is around 2-3 weeks after right colectomy.  She is dong well other than itching.  She has good appetite and bowel function.  Her energy level continues to improve.  She had serious rash in hospital that has continued to improve.  She feels good otherwise.     PERTINENT REVIEW OF SYSTEMS: Itching.    Filed Vitals:   08/28/13 0840  BP: 120/78  Pulse: 72  Resp: 18   Wt Readings from Last 3 Encounters:  08/28/13 171 lb (77.565 kg)  08/11/13 182 lb (82.555 kg)  08/11/13 182 lb (82.555 kg)    EXAM: Head: Normocephalic and atraumatic.  Eyes:  Conjunctivae are normal. Pupils are equal, round, and reactive to light. No scleral icterus.  Neck:  Normal range of motion. Neck supple. No tracheal deviation present. No thyromegaly present.  Resp: No respiratory distress, normal effort. Abd:  Abdomen is soft, non distended and non tender. No masses are palpable.  There is no rebound and no guarding.  Neurological: Alert and oriented to person, place, and time. Coordination normal.  Skin: Skin is warm and dry. No diaphoretic. No erythema. No pallor. Rash is present on right back and anterior left thigh/knee.   Psychiatric: Normal mood and affect. Normal behavior. Judgment and thought content normal.      ASSESSMENT AND PLAN:   Cancer of ascending colon s/p right colectomy 08/11/2013 We will schedule port a cath.    Reviewed risks and benefits.    Follow up with port a cath.  Itching:  I do not want to give oral steroids at this point since it is improving slowly.  If she still has itching when I place her port, I will give her a short course of steroids.        Milus Height, MD Surgical Oncology, [facility]  Nance Pear., NP Debbrah Alar, NP

## 2013-09-03 ENCOUNTER — Encounter: Payer: Self-pay | Admitting: Neurology

## 2013-09-03 ENCOUNTER — Other Ambulatory Visit (INDEPENDENT_AMBULATORY_CARE_PROVIDER_SITE_OTHER): Payer: Self-pay | Admitting: General Surgery

## 2013-09-03 ENCOUNTER — Encounter: Payer: Self-pay | Admitting: Oncology

## 2013-09-03 ENCOUNTER — Ambulatory Visit (HOSPITAL_BASED_OUTPATIENT_CLINIC_OR_DEPARTMENT_OTHER): Payer: No Typology Code available for payment source | Admitting: Oncology

## 2013-09-03 ENCOUNTER — Ambulatory Visit (INDEPENDENT_AMBULATORY_CARE_PROVIDER_SITE_OTHER): Payer: No Typology Code available for payment source | Admitting: Neurology

## 2013-09-03 ENCOUNTER — Telehealth: Payer: Self-pay | Admitting: Oncology

## 2013-09-03 ENCOUNTER — Ambulatory Visit: Payer: No Typology Code available for payment source

## 2013-09-03 VITALS — BP 127/78 | HR 79 | Temp 98.5°F | Resp 18 | Ht 66.0 in | Wt 175.9 lb

## 2013-09-03 VITALS — BP 118/70 | HR 70 | Temp 98.6°F | Resp 16 | Ht 66.0 in | Wt 174.1 lb

## 2013-09-03 DIAGNOSIS — D509 Iron deficiency anemia, unspecified: Secondary | ICD-10-CM

## 2013-09-03 DIAGNOSIS — G2581 Restless legs syndrome: Secondary | ICD-10-CM

## 2013-09-03 DIAGNOSIS — F3289 Other specified depressive episodes: Secondary | ICD-10-CM

## 2013-09-03 DIAGNOSIS — G40909 Epilepsy, unspecified, not intractable, without status epilepticus: Secondary | ICD-10-CM

## 2013-09-03 DIAGNOSIS — C182 Malignant neoplasm of ascending colon: Secondary | ICD-10-CM

## 2013-09-03 DIAGNOSIS — G40309 Generalized idiopathic epilepsy and epileptic syndromes, not intractable, without status epilepticus: Secondary | ICD-10-CM

## 2013-09-03 DIAGNOSIS — F329 Major depressive disorder, single episode, unspecified: Secondary | ICD-10-CM

## 2013-09-03 NOTE — Progress Notes (Signed)
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    UPDATE: Since last visit, [name] been diagnosed with cancer of the ascending colon.  He had a right colectomy on 08/11/13.  She does have a support system.  She is now scheduled to see the oncologist today.  Current medication:  Lamictal 100mg  twice daily (we tried to switch to XR 200mg  every morning to address drowsiness, but her insurance would only pay for part of it), Requip 4mg  at bedtime.  Last seizure was a year ago.  06/09/13 LABS:  Na 136, K 3.7, BUN 12, Cr 0.76, AP 83, AST 24, ALT 25  HISTORY: She began having seizures in 2009.  There was no precipitating factor.  Her seizures present as generalized tonic-clonic.  Reportedly, she has had several seizures while asleep, as she sometimes wakes up having bit her tongue but no blood on the pillow.  No witnesses to seizure activity at night.  She usually has migraine the next day.  No prior history of seizure that she is aware  EEG from 11/08/10 revealed generalized polyspike and wave discharges.  MRI of brain w/wo contrast (09/10/08) reviewed and was unremarkable.  Past medications:  Keppra (discontinued due to mood changes and to try and achieve better seizure control with another agent).  She also has restless leg syndrome.  PAST MEDICAL HISTORY: Past Medical History  Diagnosis Date  . Restless leg   . Hyperlipidemia     "a long time ago" (08/11/2013)  . Allergic rhinitis   . Narcolepsy     Not had a problem in years  . Anemia, iron deficiency 01/28/2013  . GERD (gastroesophageal reflux disease)   . IBS (irritable bowel syndrome)   . Anxiety   . Gastroparesis   . Allergy   . Seizures     last one 09/02/12  . Depression     off meds- "seems to be going good"    . Migraine     08/10/13- not in a long time  . Colon cancer   . Hyperglycemia 01/18/2012  . 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 lamoTRIgine (LAMICTAL) 100 MG tablet Take 1 tablet (100 mg total) by mouth 2 (two) times daily.  60 tablet  3  . omeprazole (PRILOSEC OTC) 20 MG tablet Take 20 mg by mouth daily.       . ondansetron (ZOFRAN) 4 MG tablet Take 4 mg by mouth every 6 (six) hours as needed for nausea or vomiting.      Marland Kitchen rOPINIRole (REQUIP) 4 MG tablet Take 1 tablet (4 mg total) by mouth at bedtime.  30 tablet  2  . zolpidem (AMBIEN) 5 MG tablet Take 1 tablet (5 mg total) by mouth at bedtime as needed for sleep.  20 tablet  0  . [DISCONTINUED] desvenlafaxine (PRISTIQ) 100 MG 24 hr tablet Take 100 mg by mouth daily.         No current facility-administered medications on file prior to visit.    ALLERGIES: Allergies  Allergen Reactions  . Sulfa Antibiotics Other (See Comments)    Tongue swelling  . Eggs Or Egg-Derived Products Nausea And Vomiting  . Metoclopramide Hcl Other (See Comments)  Intensifies restless leg  . Oxycodone-Acetaminophen Hives and Itching  . Penicillins Itching  . Reglan [Metoclopramide]     Irritates restless legs    FAMILY HISTORY: Family History  Problem Relation Age of Onset  . Heart attack Mother   . Colon polyps Mother   . Colitis Mother   . Diabetes Father   . Heart attack Father   . Stroke Father   . Cirrhosis Maternal Aunt   . Cancer Maternal Uncle     unknown type  . Colon cancer Neg Hx   . Esophageal cancer Neg Hx   . Rectal cancer Neg Hx   . Stomach cancer Neg Hx     SOCIAL HISTORY: History   Social History  . Marital Status: Married    Spouse Name: N/A    Number of Children: 2  . Years of Education: N/A   Occupational History  . Unemployed- full time student     Completed 12th grade   Social History Main  Topics  . Smoking status: Never Smoker   . Smokeless tobacco: Never Used  . Alcohol Use: No  . Drug Use: No  . Sexual Activity: Not on file   Other Topics Concern  . Not on file   Social History Narrative  . No narrative on file    REVIEW OF SYSTEMS: Constitutional: No fevers, chills, or sweats, no generalized fatigue, change in appetite Eyes: No visual changes, double vision, eye pain Ear, nose and throat: No hearing loss, ear pain, nasal congestion, sore throat Cardiovascular: No chest pain, palpitations Respiratory:  No shortness of breath at rest or with exertion, wheezes GastrointestinaI: No nausea, vomiting, diarrhea, abdominal pain, fecal incontinence Genitourinary:  No dysuria, urinary retention or frequency Musculoskeletal:  No neck pain, back pain Integumentary: No rash, pruritus, skin lesions Neurological: as above Psychiatric: Insomnia, anxiety. Endocrine: No palpitations, fatigue, diaphoresis, mood swings, change in appetite, change in weight, increased thirst Hematologic/Lymphatic:  No anemia, purpura, petechiae. Allergic/Immunologic: no itchy/runny eyes, nasal congestion, recent allergic reactions, rashes  PHYSICAL EXAM: Filed Vitals:   09/03/13 0756  BP: 118/70  Pulse: 70  Temp: 98.6 F (37 C)  Resp: 16   General: No acute distress Head:  Normocephalic/atraumatic Neck: supple, no paraspinal tenderness, full range of motion Heart:  Regular rate and rhythm Lungs:  Clear to auscultation bilaterally Back: No paraspinal tenderness Neurological Exam: alert and oriented to person, place, and time. Attention span and concentration intact, recent and remote memory intact, fund of knowledge intact.  Speech fluent and not dysarthric, language intact.  CN II-XII intact. Fundoscopic exam unremarkable without vessel changes, exudates, hemorrhages or papilledema.  Bulk and tone normal, muscle strength 5/5 throughout.  Sensation to light touch, temperature and vibration  intact.  Deep tendon reflexes 2+ throughout, toes downgoing.  Finger to nose and heel to shin testing intact.  Gait normal, Romberg negative.  IMPRESSION: Idiopathic generalized seizure disorder, controlled Restless leg syndrome.  PLAN: 1.  Lamictal 100mg  twice daily 2.  Requip 4mg  at bedtime 3.  Follow up in 6 months.  Metta Clines, DO  CC:  Debbrah Alar, NP

## 2013-09-03 NOTE — Patient Instructions (Signed)
Continue Lamictal 100mg  twice daily and Requip 4mg  at bedtime Follow up in 6 months.

## 2013-09-03 NOTE — Progress Notes (Signed)
Met with [name] and family. Explained role of nurse navigator. Educational information provided on colon cancer  Referral made to dietician for diet education. [facility] resources provided to patient, including SW service and support group information.  Referral to SW made today for distress screen score of 7.  Patient was given information on port-a-cath.  Contact names and phone numbers were provided for entire [facility] team.  Teach back method was used.  No barriers to care identified.  Will continue to follow as needed.

## 2013-09-03 NOTE — Progress Notes (Signed)
[name] Patient Consult   Referring MD: [name] 38 y.o.  December 17, 1975    Reason for Referral: Colon cancer   HPI: Ms. total Xeloda. With a history of gastroparesis. She developed constipation in March of 2015. She developed abdominal pain and nausea/vomiting and was evaluated in the emergency room 06/09/2013. She was found to have a microcytic anemia. She was referred to Dr. Henrene [name] and was taken for an upper endoscopy 07/17/2013. The upper endoscopy was normal. A colonoscopy the same day revealed a circumferential mass in the proximal colon. The colonoscope could not advanced beyond the mass. Biopsies were taken and the area was tattooed. The colon mucosa was otherwise normal. The pathology confirmed adenocarcinoma.  CTs of the abdomen and pelvis on 07/21/2013 revealed a short segment of wall thickening in the a sending colon suspicious for carcinoma. Mild mesenteric lymphadenopathy in the right abdomen medial to the mass with the largest node measuring 12 mm. No lymphadenopathy elsewhere in the abdomen or pelvis. No liver masses.  She was referred to Dr. Barry [name] and was taken to a laparoscopic right hemicolectomy on 08/11/2013. The peritoneum and liver appeared normal. A large mass was noted at the hepatic flexure.  The pathology ([id number]) C. confirmed a moderately differentiated adenocarcinoma of the mid ascending colon. The tumor arose in an adenoma with high-grade dysplasia. Tumor extended into the pericolonic soft tissue with involvement of the visceral peritoneum. Lymphovascular invasion was present. No perineural invasion. There was a single tumor deposit. The resection margins were negative. 5 of 25 lymph nodes contained metastatic carcinoma. The tumor returned microsatellite stable with no loss of mismatch repair protein expression. There was a separate tubular adenoma.  She was transfused with packed red blood cells on 08/16/2013 when the  hemoglobin returned at 6.  Past Medical History  Diagnosis Date  . Restless legs   . Hyperlipidemia     "a long time ago" (08/11/2013)  . Allergic rhinitis   . Narcolepsy     Not had a problem in years  . Anemia, iron deficiency 01/28/2013  . GERD (gastroesophageal reflux disease)   . IBS (irritable bowel syndrome)   . Anxiety   . Gastroparesis   . Allergy   . Seizures     last one 09/02/12  .  G2 P2        . Migraine     08/10/13- not in a long time  . Colon cancer-mid ascending colon (T4 N1)   08/11/2013   . Hyperglycemia 01/18/2012  . Severe recurrent major depression 08/15/2012    Past Surgical History  Procedure Laterality Date  . Appendectomy  2006  . Abdominal hysterectomy  2006  . Esophagogastroduodenoscopy  2011    normal  . Colonoscopy   2015   . Colon surgery Right 08/11/2013  . Tubal ligation  2002  . Laparoscopic partial colectomy Right 08/11/2013    Procedure: LAPAROSCOPIC RIGHT HEMICOLECTOMY;  Surgeon: Emily Klein, MD;  Location: [place];  Service: General;  Laterality: Right;    Medications: Reviewed  Allergies:  Allergies  Allergen Reactions  . Sulfa Antibiotics Other (See Comments)    Tongue swelling  . Eggs Or Egg-Derived Products Nausea And Vomiting  . Metoclopramide Hcl Other (See Comments)    Intensifies restless leg  . Oxycodone-Acetaminophen Hives and Itching  . Penicillins Itching  . Reglan [Metoclopramide]     Irritates restless legs    Family history: She has 2 children, 6 one half  siblings. No family history of cancer. Her mother had "polyps ".  Social History:   She lives in [place] and plans to relocate to [place] within the next week. She is in school, hairdresser. She does not use tobacco or alcohol. No risk factor for HIV or hepatitis.   ROS:   Positives include: Intermittent anorexia, 0.5 pound weight loss over the past few months, diarrhea and dark stool prior to surgery, rash over the trunk and extremities while in the  hospital-attributed oxycodone, resolving rash at the right lower back with persistent pruritus  A complete ROS was otherwise negative.  Physical Exam:  Blood pressure 127/78, pulse 79, temperature 98.5 F (36.9 C), temperature source Oral, resp. rate 18, height 5' 6"  (1.676 m), weight 175 lb 14.4 oz (79.788 kg).  HEENT: Oropharynx without visible mass, neck without mass Lungs: Clear bilaterally Cardiac: Regular rate and rhythm Abdomen: No hepatosplenomegaly, nontender, no mass, healed surgical incisions  Vascular: No leg edema Lymph nodes: No cervical, supraclavicular, axillary, or inguinal nodes Neurologic: Alert and oriented, the motor exam appears intact in the upper and lower extremities Skin: Resolving faint erythematous rash at the right lower back Musculoskeletal: No spine tenderness   LAB:  CBC  Lab Results  Component Value Date   WBC 3.5* 08/15/2013   HGB 9.2* 08/19/2013   HCT 23.6* 08/15/2013   MCV 70.0* 08/15/2013   PLT 205 08/15/2013   NEUTROABS 4.5 06/09/2013     CMP      Component Value Date/Time   NA 141 08/15/2013 0351   K 4.2 08/19/2013 0543   CL 103 08/15/2013 0351   CO2 28 08/15/2013 0351   GLUCOSE 139* 08/15/2013 0351   BUN 3* 08/15/2013 0351   CREATININE 0.76 08/19/2013 0543   CREATININE 0.69 01/27/2013 1439   CALCIUM 8.5 08/15/2013 0351   PROT 6.8 06/09/2013 0935   ALBUMIN 3.7 06/09/2013 0935   AST 24 06/09/2013 0935   ALT 25 06/09/2013 0935   ALKPHOS 83 06/09/2013 0935   BILITOT 0.6 06/09/2013 0935   GFRNONAA >90 08/19/2013 0543   GFRNONAA >89 11/21/2011 1403   GFRAA >90 08/19/2013 0543   GFRAA >89 11/21/2011 1403    Lab Results  Component Value Date   CEA 12.0* 08/11/2013    Imaging: As per history of present illness, CT 07/21/2013-reviewed   Assessment/Plan:   1. 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   iron deficiency anemia  3.   seizure disorder  4.   history of depression   Disposition:   [name] has been diagnosed with adenocarcinoma of the ascending colon. The surgical pathology reveals a stage IIIc tumor. I discussed the diagnosis, prognosis, and adjuvant treatment options with [name] and her husband. I discussed the data supporting treatment with adjuvant 5-fluorouracil and oxaliplatin chemotherapy in this setting. We discussed the significant chance of developing recurrent disease in the absence of systemic therapy and the expected improvement in the relapse rate with chemotherapy. We discussed FOLFOX, CAPOX, and the CALGB 50093 study. She is not interested in the clinical trial. She prefers FOLFOX.  We reviewed the potential toxicities associated with the FOLFOX regimen including the chance for nausea/vomiting, mucositis, diarrhea, alopecia, and hematologic toxicity. We discussed the chance of an allergic reaction. We reviewed the rash, hyperpigmentation, and hand/foot syndrome seen with 5-fluorouracil. We discussed the  various types of neuropathy associated with oxaliplatin. She agrees to proceed. She will attend a chemotherapy teaching class.  We discussed additional maneuvers that may decrease the risk of developing colon cancer including exercise and diet. We also discussed the potential role for adjuvant aspirin, but this is not yet a standard treatment recommendation. She does not appear to have hereditary colon cancer, but her family remains at increased risk of developing colon cancer and should have appropriate screening. She is scheduled to be seen in the genetics screening clinic.  [name] will be referred to Dr. Barry [name] for placement of a Port-A-Cath. She will be scheduled for a staging chest CT, repeat CEA, and baseline CBC/chemistry panel. The plan is to begin a first cycle of adjuvant FOLFOX on 09/10/2013. She will  return for an office visit and cycle 2 on 09/24/2013.  Greater than 50 minutes were spent with patient today. The majority of the time was used for counseling and coordination of care. Ali Chuk, [place] 09/03/2013, 2:41 PM

## 2013-09-03 NOTE — Telephone Encounter (Signed)
gv adn rpinted appt sched and avs for pt for July adn Aug...sed added tx. °

## 2013-09-03 NOTE — Progress Notes (Signed)
Checked in new pt with no financial concerns. °

## 2013-09-04 ENCOUNTER — Telehealth: Payer: Self-pay | Admitting: *Deleted

## 2013-09-04 ENCOUNTER — Encounter (HOSPITAL_BASED_OUTPATIENT_CLINIC_OR_DEPARTMENT_OTHER): Payer: Self-pay | Admitting: *Deleted

## 2013-09-04 ENCOUNTER — Other Ambulatory Visit: Payer: Self-pay | Admitting: *Deleted

## 2013-09-04 ENCOUNTER — Other Ambulatory Visit (HOSPITAL_BASED_OUTPATIENT_CLINIC_OR_DEPARTMENT_OTHER): Payer: No Typology Code available for payment source

## 2013-09-04 ENCOUNTER — Other Ambulatory Visit: Payer: No Typology Code available for payment source

## 2013-09-04 ENCOUNTER — Encounter: Payer: Self-pay | Admitting: *Deleted

## 2013-09-04 ENCOUNTER — Other Ambulatory Visit: Payer: Self-pay

## 2013-09-04 DIAGNOSIS — C182 Malignant neoplasm of ascending colon: Secondary | ICD-10-CM

## 2013-09-04 DIAGNOSIS — D509 Iron deficiency anemia, unspecified: Secondary | ICD-10-CM

## 2013-09-04 LAB — CBC WITH DIFFERENTIAL/PLATELET
BASO%: 0.4 % (ref 0.0–2.0)
Basophils Absolute: 0 10*3/uL (ref 0.0–0.1)
EOS ABS: 0.1 10*3/uL (ref 0.0–0.5)
EOS%: 1.9 % (ref 0.0–7.0)
HCT: 37.4 % (ref 34.8–46.6)
HGB: 11.4 g/dL — ABNORMAL LOW (ref 11.6–15.9)
LYMPH#: 1.5 10*3/uL (ref 0.9–3.3)
LYMPH%: 26.2 % (ref 14.0–49.7)
MCH: 21 pg — ABNORMAL LOW (ref 25.1–34.0)
MCHC: 30.5 g/dL — ABNORMAL LOW (ref 31.5–36.0)
MCV: 68.6 fL — AB (ref 79.5–101.0)
MONO#: 0.3 10*3/uL (ref 0.1–0.9)
MONO%: 4.9 % (ref 0.0–14.0)
NEUT%: 66.6 % (ref 38.4–76.8)
NEUTROS ABS: 3.7 10*3/uL (ref 1.5–6.5)
Platelets: 274 10*3/uL (ref 145–400)
RBC: 5.45 10*6/uL (ref 3.70–5.45)
RDW: 22 % — AB (ref 11.2–14.5)
WBC: 5.6 10*3/uL (ref 3.9–10.3)

## 2013-09-04 LAB — COMPREHENSIVE METABOLIC PANEL (CC13)
ALBUMIN: 4 g/dL (ref 3.5–5.0)
ALT: 12 U/L (ref 0–55)
AST: 15 U/L (ref 5–34)
Alkaline Phosphatase: 73 U/L (ref 40–150)
Anion Gap: 9 mEq/L (ref 3–11)
BUN: 10.6 mg/dL (ref 7.0–26.0)
CHLORIDE: 107 meq/L (ref 98–109)
CO2: 25 mEq/L (ref 22–29)
Calcium: 9.8 mg/dL (ref 8.4–10.4)
Creatinine: 0.8 mg/dL (ref 0.6–1.1)
Glucose: 136 mg/dl (ref 70–140)
POTASSIUM: 3.9 meq/L (ref 3.5–5.1)
SODIUM: 141 meq/L (ref 136–145)
TOTAL PROTEIN: 7.5 g/dL (ref 6.4–8.3)
Total Bilirubin: 0.44 mg/dL (ref 0.20–1.20)

## 2013-09-04 MED ORDER — FERROUS FUMARATE 325 (106 FE) MG PO TABS: 1.0000 | ORAL_TABLET | Freq: Every day | ORAL | Status: DC

## 2013-09-04 MED ORDER — LIDOCAINE-PRILOCAINE 2.5-2.5 % EX CREA: 1.0000 "application " | TOPICAL_CREAM | CUTANEOUS | Status: DC | PRN

## 2013-09-04 NOTE — Telephone Encounter (Signed)
Called to report she has been working on moving and now landlord has found lead in the water there. Asking if this will impact her chemo treatment next week? Informed her that she can still have her treatment, but she needs to stop drinking the water. Get bottled water to drink or water from another source until she moves She also is asking if OK for a massage? Made her aware this is OK, but needs to call the massage therapist to get the medical release for Dr. Benay Spice to sign or the therapist will not do it. She understands and agrees.

## 2013-09-05 LAB — CEA: CEA: 3.2 ng/mL (ref 0.0–5.0)

## 2013-09-06 ENCOUNTER — Other Ambulatory Visit: Payer: Self-pay | Admitting: Oncology

## 2013-09-07 ENCOUNTER — Telehealth: Payer: Self-pay | Admitting: *Deleted

## 2013-09-07 MED ORDER — ONDANSETRON HCL 8 MG PO TABS: 8.0000 mg | ORAL_TABLET | Freq: Three times a day (TID) | ORAL | Status: DC | PRN

## 2013-09-07 NOTE — Telephone Encounter (Signed)
Left message on voicemail informing pt Dr. Benay Spice is aware of her call from last week and recommends she begin treatment as scheduled. Antiemetic Rx has been sent to pharmacy.

## 2013-09-08 ENCOUNTER — Encounter (HOSPITAL_COMMUNITY): Payer: Self-pay

## 2013-09-08 ENCOUNTER — Telehealth: Payer: Self-pay | Admitting: *Deleted

## 2013-09-08 ENCOUNTER — Encounter: Payer: Self-pay | Admitting: Oncology

## 2013-09-08 ENCOUNTER — Ambulatory Visit (HOSPITAL_COMMUNITY)
Admission: RE | Admit: 2013-09-08 | Discharge: 2013-09-08 | Disposition: A | Discharge status: Home / Self Care | Payer: No Typology Code available for payment source | Source: Ambulatory Visit | Attending: Oncology | Admitting: Oncology

## 2013-09-08 ENCOUNTER — Encounter: Payer: Self-pay | Admitting: *Deleted

## 2013-09-08 DIAGNOSIS — C182 Malignant neoplasm of ascending colon: Secondary | ICD-10-CM

## 2013-09-08 DIAGNOSIS — C189 Malignant neoplasm of colon, unspecified: Secondary | ICD-10-CM | POA: Insufficient documentation

## 2013-09-08 DIAGNOSIS — Z9221 Personal history of antineoplastic chemotherapy: Secondary | ICD-10-CM | POA: Insufficient documentation

## 2013-09-08 MED ORDER — LORAZEPAM 1 MG PO TABS: 1.0000 mg | ORAL_TABLET | Freq: Every evening | ORAL | Status: DC | PRN

## 2013-09-08 NOTE — Telephone Encounter (Signed)
Message from pt reporting insomnia, Ambien ineffective. Reviewed with Dr. Benay Spice: Order received for Ativan 1 mg QHS PRN. Pt instructed to [place]. She voiced understanding.

## 2013-09-08 NOTE — Progress Notes (Signed)
[place] Psychosocial Distress Screening Clinical Social Work  Clinical Social Work was referred by distress screening protocol.  The patient scored a 7 on the Psychosocial Distress Thermometer which indicates moderate distress. Clinical Social Worker contacted patient at home to assess for distress and other psychosocial needs.  Patient stated she was feeling "better" after meeting with her oncologist and getting more information on her treatment plan.  Patient stated her biggest concern was being unable to sleep.  Patient stated she has tried prescribed sleeping aids, lavender baths and lotions, meditation cd's, and music but is still having difficulties.  CSw and patient discussed her anxiety associated with treatment and how it could be affecting her sleep.  Patient plans to speak with her physician regarding her sleep medication and alternative options.  CSw and patient also discussed the support team and support services.  Patient expressed interest in the GI support group and other support programs.  CSW encouraged patient to call with any additional questions or concerns.           Clinical Social Worker follow up needed: Yes.    If yes, follow up plan: CSW will follow up with pt at future appointments to offer additional support      ONCBCN DISTRESS SCREENING 09/03/2013  Screening Type Initial Screening  Elta Gu[place] the number that describes how much distress you have been experiencing in the past week 7  Emotional problem type Nervousness/Anxiety;Adjusting to illness  Physical Problem type Sleep/insomnia;Skin dry/itchy  Referral to clinical social work Yes  Other most distressing --sleep and adjusting to treatment; best way to contact --leave message on home phone    Johnnye Lana, MSW, [place] [phone]

## 2013-09-08 NOTE — Telephone Encounter (Signed)
Call received from [facility] RN.  Patient has called collaborative nurse and she is taking care of this request.

## 2013-09-09 ENCOUNTER — Encounter (HOSPITAL_BASED_OUTPATIENT_CLINIC_OR_DEPARTMENT_OTHER)
Admission: RE | Disposition: A | Discharge status: Home / Self Care | Payer: Self-pay | Source: Ambulatory Visit | Attending: General Surgery

## 2013-09-09 ENCOUNTER — Encounter (HOSPITAL_BASED_OUTPATIENT_CLINIC_OR_DEPARTMENT_OTHER): Payer: Self-pay | Admitting: *Deleted

## 2013-09-09 ENCOUNTER — Ambulatory Visit (HOSPITAL_COMMUNITY): Payer: No Typology Code available for payment source

## 2013-09-09 ENCOUNTER — Ambulatory Visit (HOSPITAL_BASED_OUTPATIENT_CLINIC_OR_DEPARTMENT_OTHER)
Admission: RE | Admit: 2013-09-09 | Discharge: 2013-09-09 | Disposition: A | Discharge status: Home / Self Care | Payer: No Typology Code available for payment source | Source: Ambulatory Visit | Attending: General Surgery | Admitting: General Surgery

## 2013-09-09 ENCOUNTER — Telehealth: Payer: Self-pay | Admitting: Dietician

## 2013-09-09 ENCOUNTER — Ambulatory Visit (HOSPITAL_BASED_OUTPATIENT_CLINIC_OR_DEPARTMENT_OTHER): Payer: No Typology Code available for payment source | Admitting: Anesthesiology

## 2013-09-09 ENCOUNTER — Encounter (HOSPITAL_BASED_OUTPATIENT_CLINIC_OR_DEPARTMENT_OTHER): Payer: No Typology Code available for payment source | Admitting: Anesthesiology

## 2013-09-09 DIAGNOSIS — C189 Malignant neoplasm of colon, unspecified: Secondary | ICD-10-CM

## 2013-09-09 DIAGNOSIS — K219 Gastro-esophageal reflux disease without esophagitis: Secondary | ICD-10-CM | POA: Insufficient documentation

## 2013-09-09 DIAGNOSIS — R21 Rash and other nonspecific skin eruption: Secondary | ICD-10-CM | POA: Insufficient documentation

## 2013-09-09 DIAGNOSIS — Z9049 Acquired absence of other specified parts of digestive tract: Secondary | ICD-10-CM | POA: Insufficient documentation

## 2013-09-09 DIAGNOSIS — R569 Unspecified convulsions: Secondary | ICD-10-CM | POA: Insufficient documentation

## 2013-09-09 DIAGNOSIS — C182 Malignant neoplasm of ascending colon: Secondary | ICD-10-CM | POA: Insufficient documentation

## 2013-09-09 HISTORY — DX: Personal history of other diseases of the nervous system and sense organs: Z86.69

## 2013-09-09 HISTORY — DX: Personal history of other medical treatment: Z92.89

## 2013-09-09 HISTORY — DX: Dysphagia, unspecified: R13.10

## 2013-09-09 HISTORY — DX: Other specified symptoms and signs involving the digestive system and abdomen: R19.8

## 2013-09-09 HISTORY — PX: PORTACATH PLACEMENT: SHX2246

## 2013-09-09 HISTORY — DX: Restless legs syndrome: G25.81

## 2013-09-09 LAB — POCT HEMOGLOBIN-HEMACUE: Hemoglobin: 11.7 g/dL — ABNORMAL LOW (ref 12.0–15.0)

## 2013-09-09 SURGERY — INSERTION, TUNNELED CENTRAL VENOUS DEVICE, WITH PORT
Anesthesia: General | Site: Chest | Laterality: Left

## 2013-09-09 MED ORDER — ONDANSETRON HCL 4 MG/2ML IJ SOLN
INTRAMUSCULAR | Status: DC | PRN
  Administered 2013-09-09: 4 mg via INTRAVENOUS

## 2013-09-09 MED ORDER — ONDANSETRON HCL 4 MG/2ML IJ SOLN: 4.0000 mg | Freq: Four times a day (QID) | INTRAMUSCULAR | Status: DC | PRN

## 2013-09-09 MED ORDER — HEPARIN (PORCINE) IN NACL 2-0.9 UNIT/ML-% IJ SOLN
INTRAMUSCULAR | Status: DC | PRN
  Administered 2013-09-09: 500 mL

## 2013-09-09 MED ORDER — LIDOCAINE HCL (CARDIAC) 20 MG/ML IV SOLN
INTRAVENOUS | Status: DC | PRN
  Administered 2013-09-09: 75 mg via INTRAVENOUS

## 2013-09-09 MED ORDER — CIPROFLOXACIN IN D5W 400 MG/200ML IV SOLN
400.0000 mg | INTRAVENOUS | Status: AC
  Administered 2013-09-09: 400 mg via INTRAVENOUS

## 2013-09-09 MED ORDER — MIDAZOLAM HCL 2 MG/2ML IJ SOLN: 1.0000 mg | INTRAMUSCULAR | Status: DC | PRN

## 2013-09-09 MED ORDER — HEPARIN SOD (PORK) LOCK FLUSH 100 UNIT/ML IV SOLN
INTRAVENOUS | Status: AC
  Filled 2013-09-09: qty 5

## 2013-09-09 MED ORDER — MIDAZOLAM HCL 5 MG/5ML IJ SOLN
INTRAMUSCULAR | Status: DC | PRN
  Administered 2013-09-09: 2 mg via INTRAVENOUS

## 2013-09-09 MED ORDER — FENTANYL CITRATE 0.05 MG/ML IJ SOLN
25.0000 ug | INTRAMUSCULAR | Status: DC | PRN
  Administered 2013-09-09 (×2): 25 ug via INTRAVENOUS

## 2013-09-09 MED ORDER — DEXAMETHASONE SODIUM PHOSPHATE 4 MG/ML IJ SOLN
INTRAMUSCULAR | Status: DC | PRN
  Administered 2013-09-09: 10 mg via INTRAVENOUS

## 2013-09-09 MED ORDER — FENTANYL CITRATE 0.05 MG/ML IJ SOLN
INTRAMUSCULAR | Status: AC
  Filled 2013-09-09: qty 4

## 2013-09-09 MED ORDER — PROPOFOL 10 MG/ML IV BOLUS
INTRAVENOUS | Status: AC
  Filled 2013-09-09: qty 20

## 2013-09-09 MED ORDER — HYDROCODONE-ACETAMINOPHEN 5-325 MG PO TABS
1.0000 | ORAL_TABLET | Freq: Once | ORAL | Status: AC | PRN
  Administered 2013-09-09: 1 via ORAL

## 2013-09-09 MED ORDER — PROPOFOL 10 MG/ML IV BOLUS
INTRAVENOUS | Status: DC | PRN
  Administered 2013-09-09: 170 mg via INTRAVENOUS

## 2013-09-09 MED ORDER — HYDROCODONE-ACETAMINOPHEN 5-325 MG PO TABS: 1.0000 | ORAL_TABLET | ORAL | Status: DC | PRN

## 2013-09-09 MED ORDER — HEPARIN (PORCINE) IN NACL 2-0.9 UNIT/ML-% IJ SOLN
INTRAMUSCULAR | Status: AC
  Filled 2013-09-09: qty 500

## 2013-09-09 MED ORDER — HEPARIN SOD (PORK) LOCK FLUSH 100 UNIT/ML IV SOLN
INTRAVENOUS | Status: DC | PRN
  Administered 2013-09-09: 500 [IU] via INTRAVENOUS

## 2013-09-09 MED ORDER — CIPROFLOXACIN IN D5W 400 MG/200ML IV SOLN
INTRAVENOUS | Status: AC
  Filled 2013-09-09: qty 200

## 2013-09-09 MED ORDER — FENTANYL CITRATE 0.05 MG/ML IJ SOLN
INTRAMUSCULAR | Status: AC
  Filled 2013-09-09: qty 2

## 2013-09-09 MED ORDER — FENTANYL CITRATE 0.05 MG/ML IJ SOLN: 50.0000 ug | INTRAMUSCULAR | Status: DC | PRN

## 2013-09-09 MED ORDER — BUPIVACAINE-EPINEPHRINE (PF) 0.25% -1:200000 IJ SOLN
INTRAMUSCULAR | Status: AC
  Filled 2013-09-09: qty 30

## 2013-09-09 MED ORDER — HYDROCODONE-ACETAMINOPHEN 5-325 MG PO TABS
ORAL_TABLET | ORAL | Status: AC
  Filled 2013-09-09: qty 1

## 2013-09-09 MED ORDER — MIDAZOLAM HCL 2 MG/ML PO SYRP: 12.0000 mg | ORAL_SOLUTION | Freq: Once | ORAL | Status: DC | PRN

## 2013-09-09 MED ORDER — LACTATED RINGERS IV SOLN
INTRAVENOUS | Status: DC
  Administered 2013-09-09: 09:00:00 via INTRAVENOUS

## 2013-09-09 MED ORDER — MIDAZOLAM HCL 2 MG/2ML IJ SOLN
INTRAMUSCULAR | Status: AC
  Filled 2013-09-09: qty 2

## 2013-09-09 MED ORDER — FENTANYL CITRATE 0.05 MG/ML IJ SOLN
INTRAMUSCULAR | Status: DC | PRN
  Administered 2013-09-09 (×2): 50 ug via INTRAVENOUS

## 2013-09-09 MED ORDER — BUPIVACAINE-EPINEPHRINE 0.5% -1:200000 IJ SOLN
INTRAMUSCULAR | Status: DC | PRN
Start: 2013-09-09 — End: 2013-09-09
  Administered 2013-09-09: 10 mL

## 2013-09-09 SURGICAL SUPPLY — 44 items
ADH SKN CLS APL DERMABOND .7 (GAUZE/BANDAGES/DRESSINGS) ×1
BAG DECANTER FOR FLEXI CONT (MISCELLANEOUS) ×3 IMPLANT
BLADE HEX COATED 2.75 (ELECTRODE) ×3 IMPLANT
BLADE SURG 11 STRL SS (BLADE) ×3 IMPLANT
BLADE SURG 15 STRL LF DISP TIS (BLADE) ×1 IMPLANT
BLADE SURG 15 STRL SS (BLADE) ×3
CHLORAPREP W/TINT 26ML (MISCELLANEOUS) ×3 IMPLANT
COVER MAYO STAND STRL (DRAPES) ×3 IMPLANT
COVER TABLE BACK 60X90 (DRAPES) ×3 IMPLANT
DECANTER SPIKE VIAL GLASS SM (MISCELLANEOUS) IMPLANT
DERMABOND ADVANCED (GAUZE/BANDAGES/DRESSINGS) ×2
DERMABOND ADVANCED .7 DNX12 (GAUZE/BANDAGES/DRESSINGS) ×1 IMPLANT
DRAPE C-ARM 42X72 X-RAY (DRAPES) ×3 IMPLANT
DRAPE LAPAROTOMY TRNSV 102X78 (DRAPE) ×3 IMPLANT
DRAPE UTILITY XL STRL (DRAPES) ×3 IMPLANT
DRSG TEGADERM 4X4.75 (GAUZE/BANDAGES/DRESSINGS) ×4 IMPLANT
ELECT REM PT RETURN 9FT ADLT (ELECTROSURGICAL) ×3
ELECTRODE REM PT RTRN 9FT ADLT (ELECTROSURGICAL) ×1 IMPLANT
GLOVE BIO SURGEON STRL SZ 6 (GLOVE) ×3 IMPLANT
GLOVE BIOGEL PI IND STRL 6.5 (GLOVE) ×1 IMPLANT
GLOVE BIOGEL PI IND STRL 7.0 (GLOVE) IMPLANT
GLOVE BIOGEL PI INDICATOR 6.5 (GLOVE) ×2
GLOVE BIOGEL PI INDICATOR 7.0 (GLOVE) ×2
GLOVE ECLIPSE 6.5 STRL STRAW (GLOVE) ×2 IMPLANT
GOWN STRL REUS W/ TWL LRG LVL3 (GOWN DISPOSABLE) ×1 IMPLANT
GOWN STRL REUS W/TWL 2XL LVL3 (GOWN DISPOSABLE) ×3 IMPLANT
GOWN STRL REUS W/TWL LRG LVL3 (GOWN DISPOSABLE) ×3
KIT PORT POWER 8FR ISP CVUE (Catheter) ×2 IMPLANT
NDL HYPO 25X1 1.5 SAFETY (NEEDLE) ×1 IMPLANT
NEEDLE HYPO 25X1 1.5 SAFETY (NEEDLE) ×3 IMPLANT
PACK BASIN DAY SURGERY FS (CUSTOM PROCEDURE TRAY) ×3 IMPLANT
PENCIL BUTTON HOLSTER BLD 10FT (ELECTRODE) ×3 IMPLANT
SLEEVE SCD COMPRESS KNEE MED (MISCELLANEOUS) ×3 IMPLANT
SPONGE GAUZE 4X4 12PLY STER LF (GAUZE/BANDAGES/DRESSINGS) ×4 IMPLANT
SUT MNCRL AB 4-0 PS2 18 (SUTURE) ×3 IMPLANT
SUT PROLENE 2 0 SH DA (SUTURE) ×6 IMPLANT
SUT VIC AB 3-0 SH 27 (SUTURE) ×3
SUT VIC AB 3-0 SH 27X BRD (SUTURE) ×1 IMPLANT
SUT VICRYL 3-0 CR8 SH (SUTURE) IMPLANT
SYR 5ML LUER SLIP (SYRINGE) ×3 IMPLANT
SYR CONTROL 10ML LL (SYRINGE) ×3 IMPLANT
SYRINGE 10CC LL (SYRINGE) ×3 IMPLANT
TOWEL OR 17X24 6PK STRL BLUE (TOWEL DISPOSABLE) ×3 IMPLANT
TOWEL OR NON WOVEN STRL DISP B (DISPOSABLE) ×1 IMPLANT

## 2013-09-09 NOTE — Interval H&P Note (Signed)
History and Physical Interval Note:  09/09/2013 9:17 AM  [name]  has presented today for surgery, with the diagnosis of colon cancer  The various methods of treatment have been discussed with the patient and family. After consideration of risks, benefits and other options for treatment, the patient has consented to  Procedure(s): INSERTION PORT-A-CATH (N/A) as a surgical intervention .  The patient's history has been reviewed, patient examined, no change in status, stable for surgery.  I have reviewed the patient's chart and labs.  Questions were answered to the patient's satisfaction.     Janelle Spellman

## 2013-09-09 NOTE — Transfer of Care (Signed)
Immediate Anesthesia Transfer of Care Note  Patient: [name]  Procedure(s) Performed: Procedure(s): INSERTION PORT-A-CATH (Left)  Patient Location: PACU  Anesthesia Type:General  Level of Consciousness: sedated and patient cooperative  Airway & Oxygen Therapy: Patient Spontanous Breathing and Patient connected to face mask oxygen  Post-op Assessment: Report given to PACU RN and Post -op Vital signs reviewed and stable  Post vital signs: Reviewed and stable  Complications: No apparent anesthesia complications

## 2013-09-09 NOTE — Telephone Encounter (Signed)
Brief Outpatient Oncology Nutrition Note  Patient has been identified to be at risk on malnutrition screen.  Wt Readings from Last 10 Encounters:  09/09/13 175 lb (79.379 kg)  09/09/13 175 lb (79.379 kg)  09/03/13 175 lb 14.4 oz (79.788 kg)  09/03/13 174 lb 1.6 oz (78.971 kg)  08/28/13 171 lb (77.565 kg)  08/11/13 182 lb (82.555 kg)  08/11/13 182 lb (82.555 kg)  08/05/13 182 lb (82.555 kg)  07/17/13 187 lb (84.823 kg)  06/11/13 187 lb 11.2 oz (85.14 kg)      Dx:  Colon Cancer with right partial colectomy in July.    Called patient due to weight loss.  Patient was unavailable.  Contact information for the outpatient [address] provided.  Antonieta Iba, RD, LDN

## 2013-09-09 NOTE — Discharge Instructions (Signed)
[facility] °Office Phone Number [phone] ° ° POST OP INSTRUCTIONS ° °Always review your discharge instruction sheet given to you by the facility where your surgery was performed. ° °IF YOU HAVE DISABILITY OR FAMILY LEAVE FORMS, YOU MUST BRING THEM TO THE OFFICE FOR PROCESSING.  DO NOT GIVE THEM TO YOUR DOCTOR. ° °1. A prescription for pain medication may be given to you upon discharge.  Take your pain medication as prescribed, if needed.  If narcotic pain medicine is not needed, then you may take acetaminophen (Tylenol) or ibuprofen (Advil) as needed. °2. Take your usually prescribed medications unless otherwise directed °3. If you need a refill on your pain medication, please contact your pharmacy.  They will contact our office to request authorization.  Prescriptions will not be filled after 5pm or on week-ends. °4. You should eat very light the first 24 hours after surgery, such as soup, crackers, pudding, etc.  Resume your normal diet the day after surgery °5. It is common to experience some constipation if taking pain medication after surgery.  Increasing fluid intake and taking a stool softener will usually help or prevent this problem from occurring.  A mild laxative (Milk of Magnesia or Miralax) should be taken according to package directions if there are no bowel movements after 48 hours. °6. You may shower in 48 hours.  The surgical glue will flake off in 2-3 weeks.   °7. ACTIVITIES:  No strenuous activity or heavy lifting for 1 week.   °a. You may drive when you no longer are taking prescription pain medication, you can comfortably wear a seatbelt, and you can safely maneuver your car and apply brakes. °b. RETURN TO WORK:  __________to be determined_______________ °You should see your doctor in the office for a follow-up appointment approximately three-four weeks after your surgery.   ° °WHEN TO CALL YOUR DOCTOR: °1. Fever over 101.0 °2. Nausea and/or vomiting. °3. Extreme swelling or  bruising. °4. Continued bleeding from incision. °5. Increased pain, redness, or drainage from the incision. ° °The clinic staff is available to answer your questions during regular business hours.  Please don’t hesitate to call and ask to speak to one of the nurses for clinical concerns.  If you have a medical emergency, go to the nearest emergency room or call 911.  A surgeon from [facility] is always on call at the hospital. ° °For further questions, please visit [email or web address]  ° ° ° ° °Post Anesthesia Home Care Instructions ° °Activity: °Get plenty of rest for the remainder of the day. A responsible adult should stay with you for 24 hours following the procedure.  °For the next 24 hours, DO NOT: °-Drive a car °-Operate machinery °-Drink alcoholic beverages °-Take any medication unless instructed by your physician °-Make any legal decisions or sign important papers. ° °Meals: °Start with liquid foods such as gelatin or soup. Progress to regular foods as tolerated. Avoid greasy, spicy, heavy foods. If nausea and/or vomiting occur, drink only clear liquids until the nausea and/or vomiting subsides. Call your physician if vomiting continues. ° °Special Instructions/Symptoms: °Your throat may feel dry or sore from the anesthesia or the breathing tube placed in your throat during surgery. If this causes discomfort, gargle with warm salt water. The discomfort should disappear within 24 hours. ° °

## 2013-09-09 NOTE — Anesthesia Preprocedure Evaluation (Signed)
Anesthesia Evaluation  Patient identified by MRN, date of birth, ID band Patient awake    Reviewed: Allergy & Precautions, H&P , NPO status , Patient's Chart, lab work & pertinent test results  Airway Mallampati: II  Neck ROM: full    Dental   Pulmonary          Cardiovascular negative cardio ROS      Neuro/Psych  Headaches, Seizures -,  Depression    GI/Hepatic GERD-  ,Colon CA   Endo/Other    Renal/GU      Musculoskeletal   Abdominal   Peds  Hematology   Anesthesia Other Findings   Reproductive/Obstetrics                           Anesthesia Physical Anesthesia Plan  ASA: II  Anesthesia Plan: General   Post-op Pain Management:    Induction: Intravenous  Airway Management Planned: LMA  Additional Equipment:   Intra-op Plan:   Post-operative Plan:   Informed Consent: I have reviewed the patients History and Physical, chart, labs and discussed the procedure including the risks, benefits and alternatives for the proposed anesthesia with the patient or authorized representative who has indicated his/her understanding and acceptance.     Plan Discussed with: CRNA, Anesthesiologist and Surgeon  Anesthesia Plan Comments:         Anesthesia Quick Evaluation

## 2013-09-09 NOTE — Anesthesia Postprocedure Evaluation (Signed)
Anesthesia Post Note  Patient: [name]  Procedure(s) Performed: Procedure(s) (LRB): INSERTION PORT-A-CATH (Left)  Anesthesia type: General  Patient location: PACU  Post pain: Pain level controlled and Adequate analgesia  Post assessment: Post-op Vital signs reviewed, Patient's Cardiovascular Status Stable, Respiratory Function Stable, Patent Airway and Pain level controlled  Last Vitals:  Filed Vitals:   09/09/13 0830  BP: 112/68  Pulse: 76  Temp: 36.9 C  Resp: 20    Post vital signs: Reviewed and stable  Level of consciousness: awake, alert  and oriented  Complications: No apparent anesthesia complications

## 2013-09-09 NOTE — H&P (View-Only) (Signed)
HISTORY: Pt is around 2-3 weeks after right colectomy.  She is dong well other than itching.  She has good appetite and bowel function.  Her energy level continues to improve.  She had serious rash in hospital that has continued to improve.  She feels good otherwise.     PERTINENT REVIEW OF SYSTEMS: Itching.    Filed Vitals:   08/28/13 0840  BP: 120/78  Pulse: 72  Resp: 18   Wt Readings from Last 3 Encounters:  08/28/13 171 lb (77.565 kg)  08/11/13 182 lb (82.555 kg)  08/11/13 182 lb (82.555 kg)    EXAM: Head: Normocephalic and atraumatic.  Eyes:  Conjunctivae are normal. Pupils are equal, round, and reactive to light. No scleral icterus.  Neck:  Normal range of motion. Neck supple. No tracheal deviation present. No thyromegaly present.  Resp: No respiratory distress, normal effort. Abd:  Abdomen is soft, non distended and non tender. No masses are palpable.  There is no rebound and no guarding.  Neurological: Alert and oriented to person, place, and time. Coordination normal.  Skin: Skin is warm and dry. No diaphoretic. No erythema. No pallor. Rash is present on right back and anterior left thigh/knee.   Psychiatric: Normal mood and affect. Normal behavior. Judgment and thought content normal.      ASSESSMENT AND PLAN:   Cancer of ascending colon s/p right colectomy 08/11/2013 We will schedule port a cath.    Reviewed risks and benefits.    Follow up with port a cath.  Itching:  I do not want to give oral steroids at this point since it is improving slowly.  If she still has itching when I place her port, I will give her a short course of steroids.        Milus Height, MD Surgical Oncology, [facility]  Nance Pear., NP Debbrah Alar, NP

## 2013-09-09 NOTE — Op Note (Signed)
PREOPERATIVE DIAGNOSIS:  Colon cancer     POSTOPERATIVE DIAGNOSIS:  Same     PROCEDURE: Left subclavian port placement, Bard ClearVue  Power Port, MRI safe, 8-French.      SURGEON:  Stark Klein, MD      ANESTHESIA:  General   FINDINGS:  Good venous return, easy flush, and tip of the catheter and   SVC 22 cm.      SPECIMEN:  None.      ESTIMATED BLOOD LOSS:  Minimal.      COMPLICATIONS:  None known.      PROCEDURE:  Pt was identified in the holding area and taken to   the operating room, where patient was placed supine on the operating room   table.  General anesthesia was induced.  Patient's arms were tucked and the upper   chest and neck were prepped and draped in sterile fashion.  Time-out was   performed according to the surgical safety check list.  When all was   correct, we continued.   Local anesthetic was administered over this   area at the angle of the clavicle.  The vein was accessed with 1 pass of the needle. There was good venous return and the wire passed easily with no ectopy.   Fluoroscopy was used to confirm that the wire was in the vena cava.      The patient was placed back level and the area for the pocket was anethetized   with local anesthetic.  A 3-cm transverse incision was made with a #15   blade.  Cautery was used to divide the subcutaneous tissues down to the   pectoralis muscle.  An [employer] retractor was used to elevate the skin   while a pocket was created on top of the pectoralis fascia.  The port   was placed into the pocket to confirm that it was of adequate size.  The   catheter was preattached to the port.  The port was then secured to the   pectoralis fascia with four 2-0 Prolene sutures.  These were clamped and   not tied down yet.    The catheter was tunneled through to the wire exit   site.  The catheter was placed along the wire to determine what length it should be to be in the SVC.  The catheter was cut at 22 cm.  The tunneler sheath  and dilator were passed over the wire and the dilator and wire were removed.  The catheter was advanced through the tunneler sheath and the tunneler sheath was pulled away.  Care was taken to keep the catheter in the tunneler sheath as this occurred. This was advanced and the tunneler sheath was removed.  There was good venous   return and easy flush of the catheter.  The Prolene sutures were tied   down to the pectoral fascia.  The skin was reapproximated using 3-0   Vicryl interrupted deep dermal sutures.    Fluoroscopy was used to re-confirm good position of the catheter.  The skin   was then closed using 4-0 Monocryl in a subcuticular fashion.  The port was flushed with concentrated heparin flush as well.  The wounds were then cleaned, dried, and dressed with Dermabond.  The patient was awakened from anesthesia and taken to the PACU in stable condition.  Needle, sponge, and instrument counts were correct.  The catheter was left accessed for chemotherapy tomorrow.  Stark Klein, MD

## 2013-09-09 NOTE — Anesthesia Procedure Notes (Signed)
Procedure Name: LMA Insertion Date/Time: 09/09/2013 9:47 AM Performed by: Lyndee Leo Pre-anesthesia Checklist: Patient identified, Emergency Drugs available, Suction available and Patient being monitored Patient Re-evaluated:Patient Re-evaluated prior to inductionOxygen Delivery Method: Circle System Utilized Preoxygenation: Pre-oxygenation with 100% oxygen Intubation Type: IV induction Ventilation: Mask ventilation without difficulty LMA: LMA inserted LMA Size: 4.0 Number of attempts: 1 Airway Equipment and Method: bite block Placement Confirmation: positive ETCO2 Tube secured with: Tape Dental Injury: Teeth and Oropharynx as per pre-operative assessment

## 2013-09-10 ENCOUNTER — Telehealth: Payer: Self-pay | Admitting: *Deleted

## 2013-09-10 ENCOUNTER — Encounter (HOSPITAL_BASED_OUTPATIENT_CLINIC_OR_DEPARTMENT_OTHER): Payer: Self-pay | Admitting: General Surgery

## 2013-09-10 ENCOUNTER — Ambulatory Visit (HOSPITAL_BASED_OUTPATIENT_CLINIC_OR_DEPARTMENT_OTHER): Payer: No Typology Code available for payment source

## 2013-09-10 VITALS — BP 127/74 | HR 67 | Temp 98.2°F

## 2013-09-10 DIAGNOSIS — Z5111 Encounter for antineoplastic chemotherapy: Secondary | ICD-10-CM

## 2013-09-10 DIAGNOSIS — C182 Malignant neoplasm of ascending colon: Secondary | ICD-10-CM

## 2013-09-10 MED ORDER — FLUOROURACIL CHEMO INJECTION 2.5 GM/50ML
400.0000 mg/m2 | Freq: Once | INTRAVENOUS | Status: AC
  Administered 2013-09-10: 750 mg via INTRAVENOUS
  Filled 2013-09-10: qty 15

## 2013-09-10 MED ORDER — SODIUM CHLORIDE 0.9 % IV SOLN
2400.0000 mg/m2 | INTRAVENOUS | Status: DC
  Administered 2013-09-10: 4650 mg via INTRAVENOUS
  Filled 2013-09-10: qty 93

## 2013-09-10 MED ORDER — ONDANSETRON 8 MG/50ML IVPB (CHCC)
8.0000 mg | Freq: Once | INTRAVENOUS | Status: AC
  Administered 2013-09-10: 8 mg via INTRAVENOUS

## 2013-09-10 MED ORDER — LEUCOVORIN CALCIUM INJECTION 350 MG
400.0000 mg/m2 | Freq: Once | INTRAVENOUS | Status: AC
  Administered 2013-09-10: 772 mg via INTRAVENOUS
  Filled 2013-09-10: qty 38.6

## 2013-09-10 MED ORDER — DEXTROSE 5 % IV SOLN
Freq: Once | INTRAVENOUS | Status: AC
  Administered 2013-09-10: 12:00:00 via INTRAVENOUS

## 2013-09-10 MED ORDER — DEXAMETHASONE SODIUM PHOSPHATE 10 MG/ML IJ SOLN
10.0000 mg | Freq: Once | INTRAMUSCULAR | Status: AC
  Administered 2013-09-10: 10 mg via INTRAVENOUS

## 2013-09-10 MED ORDER — DEXAMETHASONE SODIUM PHOSPHATE 10 MG/ML IJ SOLN
INTRAMUSCULAR | Status: AC
  Filled 2013-09-10: qty 1

## 2013-09-10 MED ORDER — FAMOTIDINE 20 MG PO TABS
20.0000 mg | ORAL_TABLET | Freq: Once | ORAL | Status: AC
Start: 2013-09-10 — End: 2013-09-10
  Administered 2013-09-10: 20 mg via ORAL

## 2013-09-10 MED ORDER — ONDANSETRON 8 MG/NS 50 ML IVPB
INTRAVENOUS | Status: AC
  Filled 2013-09-10: qty 8

## 2013-09-10 MED ORDER — OXALIPLATIN CHEMO INJECTION 100 MG/20ML
85.0000 mg/m2 | Freq: Once | INTRAVENOUS | Status: AC
  Administered 2013-09-10: 165 mg via INTRAVENOUS
  Filled 2013-09-10: qty 33

## 2013-09-10 NOTE — Progress Notes (Signed)
Met with patient during first treatment.  Answered questions regarding her treatment regimen. She verbalized understanding of possible side effects that might occur.  She has family with her during treatment today.  She denies barriers to care at present time.  She hopes to be able to attend the GI support group on 09/21/13.

## 2013-09-10 NOTE — Telephone Encounter (Signed)
Message copied by Brien Few on Thu Sep 10, 2013  8:49 AM ------      Message from: Betsy Coder B      Created: Wed Sep 09, 2013  8:27 PM       Please call patient, ct negative for cancer, tiny lung nodule, likely benign ------

## 2013-09-10 NOTE — Progress Notes (Signed)
Notified patient that CT chest was negative. Tiny lung nodule is likely benign per Dr. Benay Spice. Also provided her a copy of the report.

## 2013-09-10 NOTE — Patient Instructions (Signed)
[facility] Discharge Instructions for Patients Receiving Chemotherapy  Today you received the following chemotherapy agents oxaliplatin/leucovorin/flourouracil.    To help prevent nausea and vomiting after your treatment, we encourage you to take your nausea medication as directed.    If you develop nausea and vomiting that is not controlled by your nausea medication, call the clinic.   BELOW ARE SYMPTOMS THAT SHOULD BE REPORTED IMMEDIATELY:  *FEVER GREATER THAN 100.5 F  *CHILLS WITH OR WITHOUT FEVER  NAUSEA AND VOMITING THAT IS NOT CONTROLLED WITH YOUR NAUSEA MEDICATION  *UNUSUAL SHORTNESS OF BREATH  *UNUSUAL BRUISING OR BLEEDING  TENDERNESS IN MOUTH AND THROAT WITH OR WITHOUT PRESENCE OF ULCERS  *URINARY PROBLEMS  *BOWEL PROBLEMS  UNUSUAL RASH Items with * indicate a potential emergency and should be followed up as soon as possible.  Feel free to call the clinic you have any questions or concerns. The clinic phone number is [phone].

## 2013-09-11 ENCOUNTER — Telehealth: Payer: Self-pay

## 2013-09-11 NOTE — Telephone Encounter (Signed)
lvm on mobile and home, please call if any problems or questions. Go back to treatment area on Saturday for pump DC.

## 2013-09-12 ENCOUNTER — Ambulatory Visit (HOSPITAL_BASED_OUTPATIENT_CLINIC_OR_DEPARTMENT_OTHER): Payer: No Typology Code available for payment source

## 2013-09-12 VITALS — BP 122/69 | HR 87 | Temp 98.6°F

## 2013-09-12 DIAGNOSIS — C182 Malignant neoplasm of ascending colon: Secondary | ICD-10-CM

## 2013-09-12 MED ORDER — HEPARIN SOD (PORK) LOCK FLUSH 100 UNIT/ML IV SOLN
500.0000 [IU] | Freq: Once | INTRAVENOUS | Status: AC | PRN
  Administered 2013-09-12: 500 [IU]
  Filled 2013-09-12: qty 5

## 2013-09-12 MED ORDER — SODIUM CHLORIDE 0.9 % IJ SOLN
10.0000 mL | INTRAMUSCULAR | Status: DC | PRN
  Administered 2013-09-12: 10 mL
  Filled 2013-09-12: qty 10

## 2013-09-15 ENCOUNTER — Encounter: Payer: Self-pay | Admitting: Oncology

## 2013-09-16 ENCOUNTER — Other Ambulatory Visit: Payer: Self-pay

## 2013-09-20 ENCOUNTER — Other Ambulatory Visit: Payer: Self-pay | Admitting: Oncology

## 2013-09-21 ENCOUNTER — Telehealth: Payer: Self-pay | Admitting: Neurology

## 2013-09-21 NOTE — Telephone Encounter (Signed)
Pt called requesting a refill for ropinirole 4mg   Pharmacy: [facility] [place]  C/B [phone]

## 2013-09-22 ENCOUNTER — Encounter: Payer: Self-pay | Admitting: Oncology

## 2013-09-22 NOTE — Telephone Encounter (Signed)
Pt needs to know which pharmacy it was sent to pt states that she went to get rx and they said that they did not have anything from [place]  [phone]

## 2013-09-23 ENCOUNTER — Encounter: Payer: Self-pay | Admitting: Oncology

## 2013-09-23 ENCOUNTER — Encounter: Payer: Self-pay | Admitting: *Deleted

## 2013-09-23 ENCOUNTER — Telehealth: Payer: Self-pay | Admitting: *Deleted

## 2013-09-23 NOTE — Telephone Encounter (Signed)
Called patient in response to three messages sent via [facility] MyChart patient portal.  Scalp itches.  Unable to determine if this started before 1st chemotherapy on 09-10-2013 or not.  "I'm not sure when it started but it's getting worse.  I now itch on my arm and legs but there is nothing there.  My scalp isn't red either.  It's driving me crazy and I have sores from scratching.  I took ativan 1 mg and it slowed it down but did not make the itching go away."  [facility] [place]. The letter is for her husband's  Work file.  Address it to whom it may concern.  Will notify providers.  Next scheduled f/u is 09-24-2013.  MyChart: "1. Dr. Benay Spice, I am in need of a letter with the following information in it : diagnosis date, surgery removal date, hospital stay (days), chemotherapy schedule beginning to present I would appreciate it so very much! I know I see you on Thursday but my scalp is itching and I have scratched so much I am getting sores could this be a side effect of the chemo? Thank you for all your help!  [name]  2. I was wondering if anxiety could cause my head to itch so bad I have sores from scratching so much? I took an Ativan and a half of another and it slowed it down. Can I try two to see if it helps? Thanks  3. I am pretty sure it's anxiety because now I am itching in other places so is it possible just to increase my dose of Ativan? Thanks "

## 2013-09-23 NOTE — Telephone Encounter (Signed)
Verbal order received and read back from Dr. Benay Spice for patient to try benadryl 25 mg (OTC) every 6 hours for itching.  Will sign a letter to provide for patient's spouse.  Called Mrs. Tuttle with this information.  Asked that she give an update tomorrow at f/u with use of benadryl.

## 2013-09-24 ENCOUNTER — Ambulatory Visit: Payer: No Typology Code available for payment source

## 2013-09-24 ENCOUNTER — Ambulatory Visit (HOSPITAL_BASED_OUTPATIENT_CLINIC_OR_DEPARTMENT_OTHER): Payer: No Typology Code available for payment source | Admitting: Oncology

## 2013-09-24 ENCOUNTER — Telehealth: Payer: Self-pay | Admitting: Oncology

## 2013-09-24 ENCOUNTER — Other Ambulatory Visit (HOSPITAL_BASED_OUTPATIENT_CLINIC_OR_DEPARTMENT_OTHER): Payer: No Typology Code available for payment source

## 2013-09-24 ENCOUNTER — Inpatient Hospital Stay (HOSPITAL_COMMUNITY)
Admission: EM | Admit: 2013-09-24 | Discharge: 2013-09-26 | DRG: 872 | Disposition: A | Discharge status: Home / Self Care | Payer: No Typology Code available for payment source | Attending: Internal Medicine | Admitting: Internal Medicine

## 2013-09-24 ENCOUNTER — Telehealth: Payer: Self-pay | Admitting: *Deleted

## 2013-09-24 ENCOUNTER — Ambulatory Visit: Payer: No Typology Code available for payment source | Admitting: Nutrition

## 2013-09-24 ENCOUNTER — Emergency Department (HOSPITAL_COMMUNITY): Payer: No Typology Code available for payment source

## 2013-09-24 ENCOUNTER — Ambulatory Visit (HOSPITAL_BASED_OUTPATIENT_CLINIC_OR_DEPARTMENT_OTHER): Payer: No Typology Code available for payment source

## 2013-09-24 ENCOUNTER — Encounter (HOSPITAL_COMMUNITY): Payer: Self-pay | Admitting: Emergency Medicine

## 2013-09-24 VITALS — BP 147/94 | HR 92 | Temp 98.2°F | Resp 18 | Ht 66.0 in | Wt 178.7 lb

## 2013-09-24 VITALS — BP 119/79

## 2013-09-24 DIAGNOSIS — G40909 Epilepsy, unspecified, not intractable, without status epilepticus: Secondary | ICD-10-CM | POA: Diagnosis present

## 2013-09-24 DIAGNOSIS — G40309 Generalized idiopathic epilepsy and epileptic syndromes, not intractable, without status epilepticus: Secondary | ICD-10-CM | POA: Diagnosis present

## 2013-09-24 DIAGNOSIS — T451X5A Adverse effect of antineoplastic and immunosuppressive drugs, initial encounter: Secondary | ICD-10-CM | POA: Diagnosis present

## 2013-09-24 DIAGNOSIS — C189 Malignant neoplasm of colon, unspecified: Secondary | ICD-10-CM | POA: Diagnosis present

## 2013-09-24 DIAGNOSIS — D6959 Other secondary thrombocytopenia: Secondary | ICD-10-CM | POA: Diagnosis present

## 2013-09-24 DIAGNOSIS — F341 Dysthymic disorder: Secondary | ICD-10-CM | POA: Diagnosis present

## 2013-09-24 DIAGNOSIS — F329 Major depressive disorder, single episode, unspecified: Secondary | ICD-10-CM | POA: Diagnosis present

## 2013-09-24 DIAGNOSIS — C182 Malignant neoplasm of ascending colon: Secondary | ICD-10-CM | POA: Diagnosis present

## 2013-09-24 DIAGNOSIS — F411 Generalized anxiety disorder: Secondary | ICD-10-CM | POA: Diagnosis present

## 2013-09-24 DIAGNOSIS — F3289 Other specified depressive episodes: Secondary | ICD-10-CM | POA: Diagnosis present

## 2013-09-24 DIAGNOSIS — R6520 Severe sepsis without septic shock: Secondary | ICD-10-CM | POA: Diagnosis present

## 2013-09-24 DIAGNOSIS — Z79899 Other long term (current) drug therapy: Secondary | ICD-10-CM

## 2013-09-24 DIAGNOSIS — R1084 Generalized abdominal pain: Secondary | ICD-10-CM

## 2013-09-24 DIAGNOSIS — Z5111 Encounter for antineoplastic chemotherapy: Secondary | ICD-10-CM

## 2013-09-24 DIAGNOSIS — J984 Other disorders of lung: Secondary | ICD-10-CM

## 2013-09-24 DIAGNOSIS — D6481 Anemia due to antineoplastic chemotherapy: Secondary | ICD-10-CM | POA: Diagnosis present

## 2013-09-24 DIAGNOSIS — T50905S Adverse effect of unspecified drugs, medicaments and biological substances, sequela: Secondary | ICD-10-CM

## 2013-09-24 DIAGNOSIS — Z95828 Presence of other vascular implants and grafts: Secondary | ICD-10-CM

## 2013-09-24 DIAGNOSIS — R109 Unspecified abdominal pain: Secondary | ICD-10-CM | POA: Diagnosis present

## 2013-09-24 DIAGNOSIS — R1032 Left lower quadrant pain: Secondary | ICD-10-CM | POA: Diagnosis present

## 2013-09-24 DIAGNOSIS — A419 Sepsis, unspecified organism: Secondary | ICD-10-CM | POA: Diagnosis present

## 2013-09-24 DIAGNOSIS — R509 Fever, unspecified: Secondary | ICD-10-CM | POA: Diagnosis present

## 2013-09-24 DIAGNOSIS — R110 Nausea: Secondary | ICD-10-CM | POA: Diagnosis present

## 2013-09-24 DIAGNOSIS — Z9049 Acquired absence of other specified parts of digestive tract: Secondary | ICD-10-CM

## 2013-09-24 DIAGNOSIS — D509 Iron deficiency anemia, unspecified: Secondary | ICD-10-CM

## 2013-09-24 DIAGNOSIS — T50905A Adverse effect of unspecified drugs, medicaments and biological substances, initial encounter: Secondary | ICD-10-CM | POA: Diagnosis present

## 2013-09-24 DIAGNOSIS — R229 Localized swelling, mass and lump, unspecified: Secondary | ICD-10-CM

## 2013-09-24 LAB — CBC WITH DIFFERENTIAL/PLATELET
BASO%: 0.6 % (ref 0.0–2.0)
Basophils Absolute: 0 10*3/uL (ref 0.0–0.1)
Basophils Absolute: 0 10*3/uL (ref 0.0–0.1)
Basophils Relative: 0 % (ref 0–1)
EOS ABS: 0.2 10*3/uL (ref 0.0–0.5)
EOS%: 2.8 % (ref 0.0–7.0)
Eosinophils Absolute: 0 10*3/uL (ref 0.0–0.7)
Eosinophils Relative: 0 % (ref 0–5)
HCT: 34.8 % — ABNORMAL LOW (ref 36.0–46.0)
HEMATOCRIT: 36.9 % (ref 34.8–46.6)
HGB: 11.6 g/dL (ref 11.6–15.9)
Hemoglobin: 10.9 g/dL — ABNORMAL LOW (ref 12.0–15.0)
LYMPH%: 36.5 % (ref 14.0–49.7)
LYMPHS PCT: 3 % — AB (ref 12–46)
Lymphs Abs: 0.2 10*3/uL — ABNORMAL LOW (ref 0.7–4.0)
MCH: 22.4 pg — ABNORMAL LOW (ref 25.1–34.0)
MCH: 22.6 pg — ABNORMAL LOW (ref 26.0–34.0)
MCHC: 31.4 g/dL — ABNORMAL LOW (ref 31.5–36.0)
MCHC: 31.3 g/dL (ref 30.0–36.0)
MCV: 71.4 fL — ABNORMAL LOW (ref 79.5–101.0)
MCV: 72 fL — AB (ref 78.0–100.0)
MONO#: 0.4 10*3/uL (ref 0.1–0.9)
MONO%: 7.9 % (ref 0.0–14.0)
Monocytes Absolute: 0.3 10*3/uL (ref 0.1–1.0)
Monocytes Relative: 5 % (ref 3–12)
NEUT%: 52.2 % (ref 38.4–76.8)
NEUTROS ABS: 2.9 10*3/uL (ref 1.5–6.5)
Neutro Abs: 5.7 10*3/uL (ref 1.7–7.7)
Neutrophils Relative %: 92 % — ABNORMAL HIGH (ref 43–77)
PLATELETS: 182 10*3/uL (ref 145–400)
PLATELETS: 128 10*3/uL — AB (ref 150–400)
RBC: 5.17 10*6/uL (ref 3.70–5.45)
RBC: 4.83 MIL/uL (ref 3.87–5.11)
RDW: 24 % — ABNORMAL HIGH (ref 11.2–14.5)
RDW: 20 % — AB (ref 11.5–15.5)
WBC: 5.5 10*3/uL (ref 3.9–10.3)
WBC: 6.2 10*3/uL (ref 4.0–10.5)
lymph#: 2 10*3/uL (ref 0.9–3.3)

## 2013-09-24 LAB — COMPREHENSIVE METABOLIC PANEL (CC13)
ALBUMIN: 3.7 g/dL (ref 3.5–5.0)
ALT: 18 U/L (ref 0–55)
ANION GAP: 10 meq/L (ref 3–11)
AST: 18 U/L (ref 5–34)
Alkaline Phosphatase: 83 U/L (ref 40–150)
BILIRUBIN TOTAL: 0.51 mg/dL (ref 0.20–1.20)
BUN: 10.4 mg/dL (ref 7.0–26.0)
CALCIUM: 9.4 mg/dL (ref 8.4–10.4)
CHLORIDE: 106 meq/L (ref 98–109)
CO2: 23 meq/L (ref 22–29)
Creatinine: 0.8 mg/dL (ref 0.6–1.1)
GLUCOSE: 147 mg/dL — AB (ref 70–140)
Potassium: 3.8 mEq/L (ref 3.5–5.1)
SODIUM: 140 meq/L (ref 136–145)
TOTAL PROTEIN: 6.9 g/dL (ref 6.4–8.3)

## 2013-09-24 LAB — COMPREHENSIVE METABOLIC PANEL
ALBUMIN: 3.7 g/dL (ref 3.5–5.2)
ALT: 21 U/L (ref 0–35)
AST: 25 U/L (ref 0–37)
Alkaline Phosphatase: 81 U/L (ref 39–117)
Anion gap: 17 — ABNORMAL HIGH (ref 5–15)
BUN: 12 mg/dL (ref 6–23)
CO2: 20 meq/L (ref 19–32)
Calcium: 9.9 mg/dL (ref 8.4–10.5)
Chloride: 98 mEq/L (ref 96–112)
Creatinine, Ser: 0.73 mg/dL (ref 0.50–1.10)
GFR calc Af Amer: >90 mL/min (ref >90)
Glucose, Bld: 181 mg/dL — ABNORMAL HIGH (ref 70–99)
Potassium: 4.1 mEq/L (ref 3.7–5.3)
SODIUM: 135 meq/L — AB (ref 137–147)
TOTAL PROTEIN: 7.3 g/dL (ref 6.0–8.3)
Total Bilirubin: 0.6 mg/dL (ref 0.3–1.2)

## 2013-09-24 LAB — I-STAT CG4 LACTIC ACID, ED: Lactic Acid, Venous: 3.6 mmol/L — ABNORMAL HIGH (ref 0.5–2.2)

## 2013-09-24 MED ORDER — ONDANSETRON HCL 4 MG PO TABS: 4.0000 mg | ORAL_TABLET | Freq: Four times a day (QID) | ORAL | Status: DC | PRN

## 2013-09-24 MED ORDER — LAMOTRIGINE 100 MG PO TABS
100.0000 mg | ORAL_TABLET | Freq: Two times a day (BID) | ORAL | Status: DC
  Administered 2013-09-25 – 2013-09-26 (×4): 100 mg via ORAL
  Filled 2013-09-24 (×5): qty 1

## 2013-09-24 MED ORDER — VANCOMYCIN HCL IN DEXTROSE 1-5 GM/200ML-% IV SOLN
1000.0000 mg | Freq: Once | INTRAVENOUS | Status: AC
  Administered 2013-09-25: 1000 mg via INTRAVENOUS
  Filled 2013-09-24: qty 200

## 2013-09-24 MED ORDER — DEXAMETHASONE SODIUM PHOSPHATE 10 MG/ML IJ SOLN
INTRAMUSCULAR | Status: AC
  Filled 2013-09-24: qty 1

## 2013-09-24 MED ORDER — SODIUM CHLORIDE 0.9 % IJ SOLN
10.0000 mL | INTRAMUSCULAR | Status: DC | PRN
  Administered 2013-09-24: 10 mL via INTRAVENOUS
  Filled 2013-09-24: qty 10

## 2013-09-24 MED ORDER — SODIUM CHLORIDE 0.9 % IV SOLN
INTRAVENOUS | Status: AC
  Administered 2013-09-25: 01:00:00 via INTRAVENOUS

## 2013-09-24 MED ORDER — HYDROMORPHONE HCL PF 1 MG/ML IJ SOLN: 0.5000 mg | Freq: Once | INTRAMUSCULAR | Status: DC

## 2013-09-24 MED ORDER — HYDROMORPHONE HCL PF 1 MG/ML IJ SOLN
1.0000 mg | INTRAMUSCULAR | Status: DC | PRN
  Administered 2013-09-25 – 2013-09-26 (×2): 1 mg via INTRAVENOUS
  Filled 2013-09-24 (×2): qty 1

## 2013-09-24 MED ORDER — DEXAMETHASONE SODIUM PHOSPHATE 10 MG/ML IJ SOLN
10.0000 mg | Freq: Once | INTRAMUSCULAR | Status: AC
  Administered 2013-09-24: 10 mg via INTRAVENOUS

## 2013-09-24 MED ORDER — LORAZEPAM 1 MG PO TABS
1.0000 mg | ORAL_TABLET | Freq: Every evening | ORAL | Status: DC | PRN
  Administered 2013-09-25 – 2013-09-26 (×2): 1 mg via ORAL
  Filled 2013-09-24 (×2): qty 1

## 2013-09-24 MED ORDER — FLUOROURACIL CHEMO INJECTION 5 GM/100ML
2400.0000 mg/m2 | INTRAVENOUS | Status: DC
  Administered 2013-09-24: 4650 mg via INTRAVENOUS
  Filled 2013-09-24: qty 93

## 2013-09-24 MED ORDER — FLUOROURACIL CHEMO INJECTION 2.5 GM/50ML
400.0000 mg/m2 | Freq: Once | INTRAVENOUS | Status: AC
  Administered 2013-09-24: 750 mg via INTRAVENOUS
  Filled 2013-09-24: qty 15

## 2013-09-24 MED ORDER — ONDANSETRON HCL 4 MG/2ML IJ SOLN
4.0000 mg | Freq: Four times a day (QID) | INTRAMUSCULAR | Status: DC | PRN
  Filled 2013-09-24: qty 2

## 2013-09-24 MED ORDER — LEVOFLOXACIN IN D5W 750 MG/150ML IV SOLN
750.0000 mg | Freq: Once | INTRAVENOUS | Status: AC
  Administered 2013-09-24: 750 mg via INTRAVENOUS
  Filled 2013-09-24: qty 150

## 2013-09-24 MED ORDER — SODIUM CHLORIDE 0.9 % IV SOLN
1000.0000 mL | INTRAVENOUS | Status: DC
  Administered 2013-09-25: 1000 mL via INTRAVENOUS

## 2013-09-24 MED ORDER — ROPINIROLE HCL 1 MG PO TABS
4.0000 mg | ORAL_TABLET | Freq: Every day | ORAL | Status: DC
  Administered 2013-09-25 (×2): 4 mg via ORAL
  Filled 2013-09-24 (×3): qty 4

## 2013-09-24 MED ORDER — DEXTROSE 5 % IV SOLN
400.0000 mg/m2 | Freq: Once | INTRAVENOUS | Status: AC
  Administered 2013-09-24: 772 mg via INTRAVENOUS
  Filled 2013-09-24: qty 38.6

## 2013-09-24 MED ORDER — ONDANSETRON 8 MG/NS 50 ML IVPB
INTRAVENOUS | Status: AC
  Filled 2013-09-24: qty 8

## 2013-09-24 MED ORDER — DEXTROSE 5 % IV SOLN
Freq: Once | INTRAVENOUS | Status: AC
  Administered 2013-09-24: 11:00:00 via INTRAVENOUS

## 2013-09-24 MED ORDER — SODIUM CHLORIDE 0.9 % IJ SOLN: 3.0000 mL | Freq: Two times a day (BID) | INTRAMUSCULAR | Status: DC

## 2013-09-24 MED ORDER — ONDANSETRON HCL 4 MG/2ML IJ SOLN
4.0000 mg | Freq: Four times a day (QID) | INTRAMUSCULAR | Status: DC | PRN
  Administered 2013-09-24 – 2013-09-26 (×5): 4 mg via INTRAVENOUS
  Filled 2013-09-24 (×4): qty 2

## 2013-09-24 MED ORDER — AZTREONAM 2 G IJ SOLR
2.0000 g | Freq: Once | INTRAMUSCULAR | Status: AC
  Administered 2013-09-25: 2 g via INTRAVENOUS
  Filled 2013-09-24: qty 2

## 2013-09-24 MED ORDER — OXALIPLATIN CHEMO INJECTION 100 MG/20ML
85.0000 mg/m2 | Freq: Once | INTRAVENOUS | Status: AC
  Administered 2013-09-24: 165 mg via INTRAVENOUS
  Filled 2013-09-24: qty 33

## 2013-09-24 MED ORDER — SODIUM CHLORIDE 0.9 % IV BOLUS (SEPSIS)
30.0000 mL/kg | Freq: Once | INTRAVENOUS | Status: AC
  Administered 2013-09-24: 1000 mL via INTRAVENOUS

## 2013-09-24 MED ORDER — ONDANSETRON 8 MG/50ML IVPB (CHCC)
8.0000 mg | Freq: Once | INTRAVENOUS | Status: AC
  Administered 2013-09-24: 8 mg via INTRAVENOUS

## 2013-09-24 MED ORDER — SODIUM CHLORIDE 0.9 % IV SOLN
INTRAVENOUS | Status: DC
  Administered 2013-09-24 – 2013-09-26 (×3): via INTRAVENOUS

## 2013-09-24 MED ORDER — FERROUS FUMARATE 325 (106 FE) MG PO TABS
1.0000 | ORAL_TABLET | Freq: Every day | ORAL | Status: DC
  Administered 2013-09-25 – 2013-09-26 (×2): 106 mg via ORAL
  Filled 2013-09-24 (×3): qty 1

## 2013-09-24 MED ORDER — PANTOPRAZOLE SODIUM 40 MG PO TBEC
40.0000 mg | DELAYED_RELEASE_TABLET | Freq: Every day | ORAL | Status: DC
  Administered 2013-09-25 – 2013-09-26 (×2): 40 mg via ORAL
  Filled 2013-09-24 (×2): qty 1

## 2013-09-24 MED ORDER — ACETAMINOPHEN 325 MG PO TABS
650.0000 mg | ORAL_TABLET | Freq: Four times a day (QID) | ORAL | Status: DC | PRN
  Administered 2013-09-24 – 2013-09-25 (×2): 650 mg via ORAL
  Filled 2013-09-24 (×2): qty 2

## 2013-09-24 NOTE — ED Notes (Signed)
Initial Contact - pt A+Ox4, resting on stretcher with family at bedside.  Pt reports started second course of chemo today (has continuous infusion via port) and this afternoon noted fever, numbness to hands and feeling generally unwell.  Pt appears uncomfortable.  Skin PWD.  MAEI.  Speaking full/clear sentences, rr even/un-lab, lsctab.  Placed to cardiac/02 monitor.  NAD.

## 2013-09-24 NOTE — H&P (Addendum)
[facility] History and Physical  [name] WGN:[id number] DOB: 19-Mar-1975 DOA: 09/24/2013  Referring physician: ER physician PCP: Nance Pear., NP   Chief Complaint:   HPI:  38 year old female with past medical history of seizure disorder, recently diagnosed colon cancer status post right colectomy in July 2015, receiving chemotherapy with FOLFOX (Under Dr. Gearldine Shown care), last chemotherapy today who presented with nausea and abdominal discomfort over past few days prior to this admission but worsening in past couple of hours since starting chemotherapy. Patient described sharp, intermittent left lower quadrant abdominal pain, 10 out of 10 in intensity, nonradiating. Patient did not have associated vomiting. No associated fevers or chills. No associated diarrhea or blood in the stool. Patient did spike fever in emergency department. No complaints of chest pain, shortness of breath or palpitations. No lightheadedness or loss of consciousness. In ED,BP was 107/60, HR 131, R 20,  T max was 102.9 F. Initial blood work was within normal limits. Chest x-ray did not show acute cardiopulmonary findings. Because of the fever patient was started on broad-spectrum antibiotics, Levaquin, vancomycin and azithromycin for treatment of sepsis.  Assessment & Plan    Principal Problem:   Abdominal pain in the setting of colon cancer  Unclear etiology, mostly located in left lower abdomen. Her last CT abdomen was in June 2015 when she was diagnosed with colon cancer. We will obtain abdominal x-ray for further evaluation. She may require repeat CT abdomen.  Provide supportive care with IV fluids, Dilaudid 1 mg every 2 hours as needed for severe pain, Zofran as needed for nausea or vomiting.  Active problems: Sepsis, severe  Sepsis criteria met with initial vital signs BP 107/60, HR 131, RR 20, Tmax 102.9 F elevated lactic acid. No clear source of infection.  Patient was started on  broad-spectrum antibiotics: Levaquin, aztreonam and vancomycin. Appreciate pharmacy dosing these antibiotics.  Followup blood culture results, urinalysis and urine culture results. 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  Please notify Dr. Benay Spice in am of patient's admission (added in EPIC as consultant) Anemia of chronic disease / iron deficiency anemia   Secondary to history of malignancy and sequela of chemotherapy.  Continue ferrous sulfate supplementation.  Hemoglobin is 10.9  No current indications for transfusion History of seizure disorder   Continue Lamictal  DVT prophylaxis  SCD's bialterally  Radiological Exams on Admission: Dg Chest Port 1 View 09/24/2013   No acute cardiopulmonary findings.   Electronically Signed   By: Kalman Jewels M.D.   On: 09/24/2013 23:30    EKG: sinus tachycardia  Code Status: Full Family Communication: Plan of care discussed with the patient  Disposition Plan: Admit for further evaluation  [name], MD  [facility] Pager [phone]  Review of Systems:  Constitutional: Negative for fever, chills and malaise/fatigue. Negative for diaphoresis.  HENT: Negative for hearing loss, ear pain, nosebleeds, congestion, sore throat, neck pain, tinnitus and ear discharge.   Eyes: Negative for blurred vision, double vision, photophobia, pain, discharge and redness.  Respiratory: Negative for cough, hemoptysis, sputum production, shortness of breath, wheezing and stridor.   Cardiovascular: Negative for chest pain, palpitations, orthopnea, claudication and leg swelling.  Gastrointestinal: per HPI.  Genitourinary: Negative for dysuria, urgency, frequency, hematuria and flank pain.  Musculoskeletal: Negative for myalgias, back pain, joint pain and falls.  Skin: Negative for itching and rash.   Neurological: Negative for dizziness and weakness.  Negative for tingling, tremors, sensory change, speech change, focal weakness, loss of consciousness and headaches.  Endo/Heme/Allergies: Negative for environmental allergies and polydipsia. Does not bruise/bleed easily.  Psychiatric/Behavioral: Negative for suicidal ideas. The patient is not nervous/anxious.      Past Medical History  Diagnosis Date  . GERD (gastroesophageal reflux disease)   . Seizures     last seizure 08/2012  . History of migraine     no problems in "a long time"  . Anemia, iron deficiency   . Restless leg syndrome   . Colon cancer 08/2013  . History of blood transfusion 08/16/2013  . Difficulty swallowing pills    Past Surgical History  Procedure Laterality Date  . Esophagogastroduodenoscopy  07/07/2013  . Laparoscopic partial colectomy Right 08/11/2013    Procedure: LAPAROSCOPIC RIGHT HEMICOLECTOMY;  Surgeon: Stark Klein, MD;  Location: [facility];  Service: General;  Laterality: Right;  . Colonoscopy with propofol  07/07/2013  . Tubal ligation  11/07/2000  . Endometrial fulguration  11/01/2003  . Laparoscopic lysis of adhesions  11/01/2003  . Cystoscopy  11/01/2003  . Appendectomy  08/24/2004  . Abdominal hysterectomy  08/24/2004    partial  . Unilateral salpingectomy Left 08/24/2004  . Left oophorectomy  08/24/2004  . Panniculectomy  08/24/2004  . Portacath placement Left 09/09/2013    Procedure: INSERTION PORT-A-CATH;  Surgeon: Stark Klein, MD;  Location: [facility];  Service: General;  Laterality: Left;   Social History:  reports that she has never smoked. She has never used smokeless tobacco. She reports that she does not drink alcohol or use illicit drugs.  Allergies  Allergen Reactions  . Eggs Or Egg-Derived Products Nausea And Vomiting  . Metoclopramide Hcl Other (See Comments)    EXACERBATES RESTLESS LEG SYNDROME  . Oxycodone-Acetaminophen Hives  . Sulfa Antibiotics Nausea And Vomiting  .  Penicillins Itching    Family History:  Family History  Problem Relation Age of Onset  . Heart attack Mother   . Colon polyps Mother   . Colitis Mother   . Diabetes Father   . Heart attack Father   . Stroke Father   . Cirrhosis Maternal Aunt   . Cancer Maternal Uncle     unknown type  . Colon cancer Neg Hx   . Esophageal cancer Neg Hx   . Rectal cancer Neg Hx   . Stomach cancer Neg Hx      Prior to Admission medications   Medication Sig Start Date End Date Taking? Authorizing Provider  ferrous fumarate (HEMOCYTE) 325 (106 FE) MG TABS tablet Take 1 tablet (106 mg of iron total) by mouth daily. 09/03/13  Yes [name], MD  lamoTRIgine (LAMICTAL) 100 MG tablet Take 1 tablet (100 mg total) by mouth 2 (two) times daily. 08/20/13  Yes Adam Melvern Sample, DO  lidocaine-prilocaine (EMLA) cream Apply 1 application topically as needed. Apply 1 teaspoon to [facility] site 2 hours prior to stick and cover with plastic wrap to numb site 09/04/13  Yes [name], MD  LORazepam (ATIVAN) 1 MG tablet Take 1 tablet (1 mg total) by mouth at bedtime as needed for anxiety or sleep. 09/08/13  Yes [name], MD  omeprazole (PRILOSEC OTC) 20 MG tablet Take 20 mg by mouth daily.    Yes Historical Provider, MD  ondansetron (ZOFRAN) 8 MG tablet Take 1 tablet (8 mg total) by mouth every 8 (eight) hours as needed for nausea or vomiting. 09/07/13  Yes [name], MD  rOPINIRole (REQUIP) 4 MG tablet Take 1 tablet (4 mg total) by mouth at bedtime. 06/17/13  Yes Dudley Major, DO   Physical Exam: Filed Vitals:   09/24/13 2211 09/24/13 2246 09/24/13 2246 09/24/13 2344  BP: 107/60     Pulse: 113     Temp:    102.1 F (38.9 C)  TempSrc:    Oral  Resp:      Height:  5' 6"  (1.676 m) 5' 6"  (1.676 m)   Weight:  81.1 kg (178 lb 12.7 oz) 80.74 kg (178 lb)   SpO2: 95%       Physical Exam  Constitutional: Appears well-developed and well-nourished. No distress.  HENT: Normocephalic. No tonsillar  erythema or exudates Eyes: Conjunctivae and EOM are normal. PERRLA, no scleral icterus.  Neck: Normal ROM. Neck supple. No JVD. No tracheal deviation. No thyromegaly.  CVS: RRR, S1/S2 +, no murmurs, no gallops, no carotid bruit.  Pulmonary: Effort and breath sounds normal, no stridor, rhonchi, wheezes, rales.  Abdominal: Soft. BS +,  no distension, tenderness in left lower quadrant, no rebound or guarding.  Musculoskeletal: Normal range of motion. No edema and no tenderness.  Lymphadenopathy: No lymphadenopathy noted, cervical, inguinal. Neuro: Alert. Normal reflexes, muscle tone coordination. No focal neurologic deficits. Skin: Skin is warm and dry. No rash noted. Not diaphoretic. No erythema. No pallor.  Psychiatric: Normal mood and affect. Behavior, judgment, thought content normal.   Labs on Admission:  Basic Metabolic Panel:  Recent Labs Lab 09/24/13 0902  NA 140  K 3.8  CO2 23  GLUCOSE 147*  BUN 10.4  CREATININE 0.8  CALCIUM 9.4   Liver Function Tests:  Recent Labs Lab 09/24/13 0902  AST 18  ALT 18  ALKPHOS 83  BILITOT 0.51  PROT 6.9  ALBUMIN 3.7   No results found for this basename: LIPASE, AMYLASE,  in the last 168 hours No results found for this basename: AMMONIA,  in the last 168 hours CBC:  Recent Labs Lab 09/24/13 0902 09/24/13 2233  WBC 5.5 6.2  NEUTROABS 2.9 5.7  HGB 11.6 10.9*  HCT 36.9 34.8*  MCV 71.4* 72.0*  PLT 182 128*   Cardiac Enzymes: No results found for this basename: CKTOTAL, CKMB, CKMBINDEX, TROPONINI,  in the last 168 hours BNP: No components found with this basename: POCBNP,  CBG: No results found for this basename: GLUCAP,  in the last 168 hours  If 7PM-7AM, please contact night-coverage [email or web address] Password [email or web address] 09/24/2013, 11:50 PM

## 2013-09-24 NOTE — Progress Notes (Signed)
38 year old female diagnosed with colon cancer, status post lap right hemicolectomy.  She is a patient of Dr. Benay Spice.  Past medical history includes hyperlipidemia, iron deficiency anemia, GERD, IBS, anxiety, gastroparesis, seizures, depression, and migraines.  Medications include Ativan, Prilosec, Zofran, and Requip.  Labs include glucose 147 on August 20.  Height: 66 inches. Weight: 178.7 pounds August 20. Usual body weight: 190 pounds March 2015. BMI: 28.86.  Patient reports she typically eats one meal a day.  She does not tend to eat breakfast or lunch, but then eats a large dinner.  She reports she does not usually feel well after a large dinner.  Patient enjoys milk and eats most foods.  Patient is pleased with weight loss and reports she does not want to gain back any of the weight that she has lost.  Complains of fatigue.  Nutrition diagnosis: Unintended weight loss related to new diagnosis of colon cancer and associated treatments as evidenced by 12 pound weight loss over 5 months.  Intervention: Educated patient on importance of smaller, more frequent meals; between 5 and 6 times daily.  Recommended patient try Carnation breakfast essentials first thing in the morning. Reviewed importance of high protein foods and their role in promoting healing. Reviewed recipes for patient to consume after treatment with oxaliplatin. Provided fact sheets.  Questions answered.  Teach back method used.  Contact information was given.  Monitoring, evaluation, goals: Patient will tolerate adequate calories and protein to promote weight maintenance.  Next visit: Thursday, September 3, during chemotherapy.  **Disclaimer: This note was dictated with voice recognition software. Similar sounding words can inadvertently be transcribed and this note may contain transcription errors which may not have been corrected upon publication of note.**

## 2013-09-24 NOTE — Patient Instructions (Signed)

## 2013-09-24 NOTE — Patient Instructions (Signed)
Fluorouracil, 5-FU injection  What is this medicine?  FLUOROURACIL, 5-FU (flure oh YOOR a sil) is a chemotherapy drug. It slows the growth of cancer cells. This medicine is used to treat many types of cancer like breast cancer, colon or rectal cancer, pancreatic cancer, and stomach cancer.  This medicine may be used for other purposes; ask your health care provider or pharmacist if you have questions.  COMMON BRAND NAME(S): Adrucil  What should I tell my health care provider before I take this medicine?  They need to know if you have any of these conditions:  -blood disorders  -dihydropyrimidine dehydrogenase (DPD) deficiency  -infection (especially a virus infection such as chickenpox, cold sores, or herpes)  -kidney disease  -liver disease  -malnourished, poor nutrition  -recent or ongoing radiation therapy  -an unusual or allergic reaction to fluorouracil, other chemotherapy, other medicines, foods, dyes, or preservatives  -pregnant or trying to get pregnant  -breast-feeding  How should I use this medicine?  This drug is given as an infusion or injection into a vein. It is administered in a hospital or clinic by a specially trained health care professional.  Talk to your pediatrician regarding the use of this medicine in children. Special care may be needed.  Overdosage: If you think you have taken too much of this medicine contact a poison control center or emergency room at once.  NOTE: This medicine is only for you. Do not share this medicine with others.  What if I miss a dose?  It is important not to miss your dose. Call your doctor or health care professional if you are unable to keep an appointment.  What may interact with this medicine?  -allopurinol  -cimetidine  -dapsone  -digoxin  -hydroxyurea  -leucovorin  -levamisole  -medicines for seizures like ethotoin, fosphenytoin, phenytoin  -medicines to increase blood counts like filgrastim, pegfilgrastim, sargramostim  -medicines that treat or prevent blood  clots like warfarin, enoxaparin, and dalteparin  -methotrexate  -metronidazole  -pyrimethamine  -some other chemotherapy drugs like busulfan, cisplatin, estramustine, vinblastine  -trimethoprim  -trimetrexate  -vaccines  Talk to your doctor or health care professional before taking any of these medicines:  -acetaminophen  -aspirin  -ibuprofen  -ketoprofen  -naproxen  This list may not describe all possible interactions. Give your health care provider a list of all the medicines, herbs, non-prescription drugs, or dietary supplements you use. Also tell them if you smoke, drink alcohol, or use illegal drugs. Some items may interact with your medicine.  What should I watch for while using this medicine?  Visit your doctor for checks on your progress. This drug may make you feel generally unwell. This is not uncommon, as chemotherapy can affect healthy cells as well as cancer cells. Report any side effects. Continue your course of treatment even though you feel ill unless your doctor tells you to stop.  In some cases, you may be given additional medicines to help with side effects. Follow all directions for their use.  Call your doctor or health care professional for advice if you get a fever, chills or sore throat, or other symptoms of a cold or flu. Do not treat yourself. This drug decreases your body's ability to fight infections. Try to avoid being around people who are sick.  This medicine may increase your risk to bruise or bleed. Call your doctor or health care professional if you notice any unusual bleeding.  Be careful brushing and flossing your teeth or using a toothpick   because you may get an infection or bleed more easily. If you have any dental work done, tell your dentist you are receiving this medicine.  Avoid taking products that contain aspirin, acetaminophen, ibuprofen, naproxen, or ketoprofen unless instructed by your doctor. These medicines may hide a fever.  Do not become pregnant while taking this  medicine. Women should inform their doctor if they wish to become pregnant or think they might be pregnant. There is a potential for serious side effects to an unborn child. Talk to your health care professional or pharmacist for more information. Do not breast-feed an infant while taking this medicine.  Men should inform their doctor if they wish to father a child. This medicine may lower sperm counts.  Do not treat diarrhea with over the counter products. Contact your doctor if you have diarrhea that lasts more than 2 days or if it is severe and watery.  This medicine can make you more sensitive to the sun. Keep out of the sun. If you cannot avoid being in the sun, wear protective clothing and use sunscreen. Do not use sun lamps or tanning beds/booths.  What side effects may I notice from receiving this medicine?  Side effects that you should report to your doctor or health care professional as soon as possible:  -allergic reactions like skin rash, itching or hives, swelling of the face, lips, or tongue  -low blood counts - this medicine may decrease the number of white blood cells, red blood cells and platelets. You may be at increased risk for infections and bleeding.  -signs of infection - fever or chills, cough, sore throat, pain or difficulty passing urine  -signs of decreased platelets or bleeding - bruising, pinpoint red spots on the skin, black, tarry stools, blood in the urine  -signs of decreased red blood cells - unusually weak or tired, fainting spells, lightheadedness  -breathing problems  -changes in vision  -chest pain  -mouth sores  -nausea and vomiting  -pain, swelling, redness at site where injected  -pain, tingling, numbness in the hands or feet  -redness, swelling, or sores on hands or feet  -stomach pain  -unusual bleeding  Side effects that usually do not require medical attention (report to your doctor or health care professional if they continue or are bothersome):  -changes in finger or  toe nails  -diarrhea  -dry or itchy skin  -hair loss  -headache  -loss of appetite  -sensitivity of eyes to the light  -stomach upset  -unusually teary eyes  This list may not describe all possible side effects. Call your doctor for medical advice about side effects. You may report side effects to FDA at 1-800-FDA-1088.  Where should I keep my medicine?  This drug is given in a hospital or clinic and will not be stored at home.  NOTE: This sheet is a summary. It may not cover all possible information. If you have questions about this medicine, talk to your doctor, pharmacist, or health care provider.   2015, Elsevier/Gold Standard. (2007-05-28 13:53:16)  Leucovorin injection  What is this medicine?  LEUCOVORIN (loo koe VOR in) is used to prevent or treat the harmful effects of some medicines. This medicine is used to treat anemia caused by a low amount of folic acid in the body. It is also used with 5-fluorouracil (5-FU) to treat colon cancer.  This medicine may be used for other purposes; ask your health care provider or pharmacist if you have questions.  What should   I tell my health care provider before I take this medicine?  They need to know if you have any of these conditions:  -anemia from low levels of vitamin B-12 in the blood  -an unusual or allergic reaction to leucovorin, folic acid, other medicines, foods, dyes, or preservatives  -pregnant or trying to get pregnant  -breast-feeding  How should I use this medicine?  This medicine is for injection into a muscle or into a vein. It is given by a health care professional in a hospital or clinic setting.  Talk to your pediatrician regarding the use of this medicine in children. Special care may be needed.  Overdosage: If you think you have taken too much of this medicine contact a poison control center or emergency room at once.  NOTE: This medicine is only for you. Do not share this medicine with others.  What if I miss a dose?  This does not apply.  What  may interact with this medicine?  -capecitabine  -fluorouracil  -phenobarbital  -phenytoin  -primidone  -trimethoprim-sulfamethoxazole  This list may not describe all possible interactions. Give your health care provider a list of all the medicines, herbs, non-prescription drugs, or dietary supplements you use. Also tell them if you smoke, drink alcohol, or use illegal drugs. Some items may interact with your medicine.  What should I watch for while using this medicine?  Your condition will be monitored carefully while you are receiving this medicine.  This medicine may increase the side effects of 5-fluorouracil, 5-FU. Tell your doctor or health care professional if you have diarrhea or mouth sores that do not get better or that get worse.  What side effects may I notice from receiving this medicine?  Side effects that you should report to your doctor or health care professional as soon as possible:  -allergic reactions like skin rash, itching or hives, swelling of the face, lips, or tongue  -breathing problems  -fever, infection  -mouth sores  -unusual bleeding or bruising  -unusually weak or tired  Side effects that usually do not require medical attention (report to your doctor or health care professional if they continue or are bothersome):  -constipation or diarrhea  -loss of appetite  -nausea, vomiting  This list may not describe all possible side effects. Call your doctor for medical advice about side effects. You may report side effects to FDA at 1-800-FDA-1088.  Where should I keep my medicine?  This drug is given in a hospital or clinic and will not be stored at home.  NOTE: This sheet is a summary. It may not cover all possible information. If you have questions about this medicine, talk to your doctor, pharmacist, or health care provider.   2015, [employer]vier/Gold Standard. (2007-07-29 16:50:29)  Oxaliplatin Injection  What is this medicine?  OXALIPLATIN (ox AL i PLA tin) is a chemotherapy drug. It targets  fast dividing cells, like cancer cells, and causes these cells to die. This medicine is used to treat cancers of the colon and rectum, and many other cancers.  This medicine may be used for other purposes; ask your health care provider or pharmacist if you have questions.  COMMON BRAND NAME(S): Eloxatin  What should I tell my health care provider before I take this medicine?  They need to know if you have any of these conditions:  -kidney disease  -an unusual or allergic reaction to oxaliplatin, other chemotherapy, other medicines, foods, dyes, or preservatives  -pregnant or trying to get pregnant  -  breast-feeding  How should I use this medicine?  This drug is given as an infusion into a vein. It is administered in a hospital or clinic by a specially trained health care professional.  Talk to your pediatrician regarding the use of this medicine in children. Special care may be needed.  Overdosage: If you think you have taken too much of this medicine contact a poison control center or emergency room at once.  NOTE: This medicine is only for you. Do not share this medicine with others.  What if I miss a dose?  It is important not to miss a dose. Call your doctor or health care professional if you are unable to keep an appointment.  What may interact with this medicine?  -medicines to increase blood counts like filgrastim, pegfilgrastim, sargramostim  -probenecid  -some antibiotics like amikacin, gentamicin, neomycin, polymyxin B, streptomycin, tobramycin  -zalcitabine  Talk to your doctor or health care professional before taking any of these medicines:  -acetaminophen  -aspirin  -ibuprofen  -ketoprofen  -naproxen  This list may not describe all possible interactions. Give your health care provider a list of all the medicines, herbs, non-prescription drugs, or dietary supplements you use. Also tell them if you smoke, drink alcohol, or use illegal drugs. Some items may interact with your medicine.  What should I watch  for while using this medicine?  Your condition will be monitored carefully while you are receiving this medicine. You will need important blood work done while you are taking this medicine.  This medicine can make you more sensitive to cold. Do not drink cold drinks or use ice. Cover exposed skin before coming in contact with cold temperatures or cold objects. When out in cold weather wear warm clothing and cover your mouth and nose to warm the air that goes into your lungs. Tell your doctor if you get sensitive to the cold.  This drug may make you feel generally unwell. This is not uncommon, as chemotherapy can affect healthy cells as well as cancer cells. Report any side effects. Continue your course of treatment even though you feel ill unless your doctor tells you to stop.  In some cases, you may be given additional medicines to help with side effects. Follow all directions for their use.  Call your doctor or health care professional for advice if you get a fever, chills or sore throat, or other symptoms of a cold or flu. Do not treat yourself. This drug decreases your body's ability to fight infections. Try to avoid being around people who are sick.  This medicine may increase your risk to bruise or bleed. Call your doctor or health care professional if you notice any unusual bleeding.  Be careful brushing and flossing your teeth or using a toothpick because you may get an infection or bleed more easily. If you have any dental work done, tell your dentist you are receiving this medicine.  Avoid taking products that contain aspirin, acetaminophen, ibuprofen, naproxen, or ketoprofen unless instructed by your doctor. These medicines may hide a fever.  Do not become pregnant while taking this medicine. Women should inform their doctor if they wish to become pregnant or think they might be pregnant. There is a potential for serious side effects to an unborn child. Talk to your health care professional or pharmacist  for more information. Do not breast-feed an infant while taking this medicine.  Call your doctor or health care professional if you get diarrhea. Do not treat yourself.    What side effects may I notice from receiving this medicine?  Side effects that you should report to your doctor or health care professional as soon as possible:  -allergic reactions like skin rash, itching or hives, swelling of the face, lips, or tongue  -low blood counts - This drug may decrease the number of white blood cells, red blood cells and platelets. You may be at increased risk for infections and bleeding.  -signs of infection - fever or chills, cough, sore throat, pain or difficulty passing urine  -signs of decreased platelets or bleeding - bruising, pinpoint red spots on the skin, black, tarry stools, nosebleeds  -signs of decreased red blood cells - unusually weak or tired, fainting spells, lightheadedness  -breathing problems  -chest pain, pressure  -cough  -diarrhea  -jaw tightness  -mouth sores  -nausea and vomiting  -pain, swelling, redness or irritation at the injection site  -pain, tingling, numbness in the hands or feet  -problems with balance, talking, walking  -redness, blistering, peeling or loosening of the skin, including inside the mouth  -trouble passing urine or change in the amount of urine  Side effects that usually do not require medical attention (report to your doctor or health care professional if they continue or are bothersome):  -changes in vision  -constipation  -hair loss  -loss of appetite  -metallic taste in the mouth or changes in taste  -stomach pain  This list may not describe all possible side effects. Call your doctor for medical advice about side effects. You may report side effects to FDA at 1-800-FDA-1088.  Where should I keep my medicine?  This drug is given in a hospital or clinic and will not be stored at home.  NOTE: This sheet is a summary. It may not cover all possible information. If you have  questions about this medicine, talk to your doctor, pharmacist, or health care provider.   2015, [employer]vier/Gold Standard. (2007-08-19 17:22:47)

## 2013-09-24 NOTE — Progress Notes (Signed)
Accessed PAC, patient had blood return approx 3cc noted, patient sent to lab for phlebotomist to draw labs from arm, patient left accessed for chemo.

## 2013-09-24 NOTE — ED Notes (Addendum)
Pt presents with c/o fever. Pt is a cancer pt, currently undergoing chemo treatment, last treatment was today. Pt noticed the fever at home, no home medications given for fever. Pt reports nausea as well.

## 2013-09-24 NOTE — ED Provider Notes (Signed)
Patient presented emergency room with fever. She is currently receiving chemotherapy treatments for colon cancer. The patient started having fever today. She's had nausea and diarrhea. She denies any coughing. Chest noticed any rashes. Physical Exam  BP 107/60  Pulse 113  Temp(Src) 102.1 F (38.9 C) (Oral)  Resp 20  Ht 5\' 6"  (1.676 m)  Wt 178 lb (80.74 kg)  BMI 28.74 kg/m2  SpO2 95%  Physical Exam  Nursing note and vitals reviewed. Constitutional: She appears well-developed and well-nourished. No distress.  HENT:  Head: Normocephalic and atraumatic.  Right Ear: External ear normal.  Left Ear: External ear normal.  Eyes: Conjunctivae are normal. Right eye exhibits no discharge. Left eye exhibits no discharge. No scleral icterus.  Neck: Neck supple. No tracheal deviation present.  Cardiovascular: Normal rate.   Pulmonary/Chest: Effort normal. No stridor. No respiratory distress.  Musculoskeletal: She exhibits no edema.  Neurological: She is alert. Cranial nerve deficit: no gross deficits.  Skin: Skin is warm and dry. No rash noted.  Psychiatric: She has a normal mood and affect.    ED Course  Procedures  MDM Patient's symptoms are concerning for the possibility of  Bacteremia or early sepsis. She is not hypertensive she is tachycardic, febrile and has an elevated lactic acid level.. Empiric Antibiotics have been started. She will be admitted for further treatment.  I saw and evaluated the patient, reviewed the resident's note and I agree with the findings and plan.  EKG Rate 112 Sinus tachycardia No significant change since last tracing Confirmed by Leon Montoya MD-J, Denai Caba ([id number]) on 09/24/2013 11:08:37 PM  Dorie Rank, MD 09/25/13 0000

## 2013-09-24 NOTE — Telephone Encounter (Signed)
Per staff message and POF I have scheduled appts. Advised scheduler of appts. JMW  

## 2013-09-24 NOTE — Progress Notes (Signed)
  [facility] OFFICE PROGRESS NOTE   Diagnosis: Colon cancer  INTERVAL HISTORY:   She completed a first cycle of FOLFOX on 09/10/2013. She had nausea for a few days beginning on day 4, but no emesis. 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 She had cold sensitivity for approximately 4 days following chemotherapy. She reports pruritus and "bumps "over the scalp. This is improving. No other rash.  Objective:  Vital signs in last 24 hours:  There were no vitals taken for this visit.    HEENT: No thrush or ulcers. Geographic tongue Resp: Lungs clear bilaterally Cardio: Regular rate and rhythm GI: No hepatomegaly, nontender Vascular: No leg edema  Skin: Mild hyperpigmentation of the palms, few 1-2 mm scalp lesions without vesicles   Portacath/PICC-without erythema  Lab Results:  Lab Results  Component Value Date   WBC 5.6 09/04/2013   HGB 11.7* 09/09/2013   HCT 37.4 09/04/2013   MCV 68.6* 09/04/2013   PLT 274 09/04/2013   NEUTROABS 3.7 09/04/2013    Lab Results  Component Value Date   CEA 3.2 09/04/2013    Imaging: CT chest 09/08/2013-4 mm right lower lobe nodule, no other nodules.  Medications: I have reviewed the patient's current medications.  Assessment/Plan: 1. 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 iron deficiency anemia  3. seizure disorder  4. history of depression  5. 4 mm right lower lobe nodule on a staging chest CT 09/08/2013  Disposition:  [name] tolerated the first cycle of FOLFOX well. She will contact us if the scalp lesions progress. The plan is to proceed with cycle 2 adjuvant FOLFOX today. She will return for an office visit and cycle 3 in 2 weeks.  Betsy Coder, MD  09/24/2013  8:41 AM

## 2013-09-24 NOTE — ED Notes (Signed)
EKG given to EDP, Otter,MD. For review. 

## 2013-09-24 NOTE — ED Provider Notes (Signed)
CSN: [id number]     Arrival date & time 09/24/13  2133 History   First MD Initiated Contact with Patient 09/24/13 2155     Chief Complaint  Patient presents with  . Fever   HPI  [name] is a 38 year old female with a history of T4a N2a colon cancer (status post right colectomy in July 2015, currently on adjuvant FOLFOX) who received a course of chemotherapy this morning and reports concurrent fever, nausea and malaise. She measured a temperature up to 102, and has been nauseated but no vomiting. She denies any cough, shortness of breath, chest pain, dysuria, hematuria, constipation, or diarrhea. She does report some numbness in her hands, which are symptoms that she has previously experienced with her chemotherapy.   Past Medical History  Diagnosis Date  . GERD (gastroesophageal reflux disease)   . Seizures     last seizure 08/2012  . History of migraine     no problems in "a long time"  . Anemia, iron deficiency   . Restless leg syndrome   . Colon cancer 08/2013  . History of blood transfusion 08/16/2013  . Difficulty swallowing pills    Past Surgical History  Procedure Laterality Date  . Esophagogastroduodenoscopy  07/07/2013  . Laparoscopic partial colectomy Right 08/11/2013    Procedure: LAPAROSCOPIC RIGHT HEMICOLECTOMY;  Surgeon: Stark Klein, MD;  Location: [facility];  Service: General;  Laterality: Right;  . Colonoscopy with propofol  07/07/2013  . Tubal ligation  11/07/2000  . Endometrial fulguration  11/01/2003  . Laparoscopic lysis of adhesions  11/01/2003  . Cystoscopy  11/01/2003  . Appendectomy  08/24/2004  . Abdominal hysterectomy  08/24/2004    partial  . Unilateral salpingectomy Left 08/24/2004  . Left oophorectomy  08/24/2004  . Panniculectomy  08/24/2004  . Portacath placement Left 09/09/2013    Procedure: INSERTION PORT-A-CATH;  Surgeon: Stark Klein, MD;  Location: [facility];  Service: General;  Laterality: Left;   Family History  Problem Relation Age of  Onset  . Heart attack Mother   . Colon polyps Mother   . Colitis Mother   . Diabetes Father   . Heart attack Father   . Stroke Father   . Cirrhosis Maternal Aunt   . Cancer Maternal Uncle     unknown type  . Colon cancer Neg Hx   . Esophageal cancer Neg Hx   . Rectal cancer Neg Hx   . Stomach cancer Neg Hx    History  Substance Use Topics  . Smoking status: Never Smoker   . Smokeless tobacco: Never Used  . Alcohol Use: No   OB History   Grav Para Term Preterm Abortions TAB SAB Ect Mult Living                 Review of Systems  Constitutional: Positive for fever and chills.  Respiratory: Negative for cough, chest tightness, shortness of breath and wheezing.   Cardiovascular: Negative for chest pain.  Gastrointestinal: Positive for nausea. Negative for vomiting, abdominal pain, diarrhea, constipation and abdominal distention.  Genitourinary: Negative for dysuria and hematuria.  Neurological: Positive for numbness.      Allergies  Eggs or egg-derived products; Metoclopramide hcl; Oxycodone-acetaminophen; Sulfa antibiotics; and Penicillins  Home Medications   Prior to Admission medications   Medication Sig Start Date End Date Taking? Authorizing Provider  ferrous fumarate (HEMOCYTE) 325 (106 FE) MG TABS tablet Take 1 tablet (106 mg of iron total) by mouth daily. 09/03/13  Yes Izola Price  Benay Spice, MD  lamoTRIgine (LAMICTAL) 100 MG tablet Take 1 tablet (100 mg total) by mouth 2 (two) times daily. 08/20/13  Yes Adam Melvern Sample, DO  lidocaine-prilocaine (EMLA) cream Apply 1 application topically as needed. Apply 1 teaspoon to [facility] site 2 hours prior to stick and cover with plastic wrap to numb site 09/04/13  Yes [name], MD  LORazepam (ATIVAN) 1 MG tablet Take 1 tablet (1 mg total) by mouth at bedtime as needed for anxiety or sleep. 09/08/13  Yes [name], MD  omeprazole (PRILOSEC OTC) 20 MG tablet Take 20 mg by mouth daily.    Yes Historical Provider, MD  ondansetron  (ZOFRAN) 8 MG tablet Take 1 tablet (8 mg total) by mouth every 8 (eight) hours as needed for nausea or vomiting. 09/07/13  Yes [name], MD  rOPINIRole (REQUIP) 4 MG tablet Take 1 tablet (4 mg total) by mouth at bedtime. 06/17/13  Yes Adam Melvern Sample, DO   BP 111/67  Pulse 131  Temp(Src) 102.9 F (39.4 C) (Oral)  Resp 20  Ht 5\' 6"  (1.676 m)  Wt 178 lb (80.74 kg)  BMI 28.74 kg/m2  SpO2 98% Physical Exam  Constitutional: She is oriented to person, place, and time.  Somnolent  HENT:  Head: Normocephalic and atraumatic.  No oral lesions  Eyes: EOM are normal. Pupils are equal, round, and reactive to light.  Neck: Normal range of motion. Neck supple. No thyromegaly present.  Cardiovascular: Regular rhythm.  Exam reveals no gallop and no friction rub.   No murmur heard. Tachycardic  Pulmonary/Chest: Effort normal and breath sounds normal.  Abdominal: Soft. Bowel sounds are normal. She exhibits no distension. There is no tenderness. There is no rebound.  Musculoskeletal: She exhibits no edema.  Neurological: She is alert and oriented to person, place, and time. No cranial nerve deficit.  Skin: Skin is warm.    ED Course  Procedures (including critical care time) Labs Review Labs Reviewed  CBC WITH DIFFERENTIAL - Abnormal; Notable for the following:    Hemoglobin 10.9 (*)    HCT 34.8 (*)    MCV 72.0 (*)    MCH 22.6 (*)    RDW 20.0 (*)    Platelets 128 (*)    Neutrophils Relative % 92 (*)    Lymphocytes Relative 3 (*)    Lymphs Abs 0.2 (*)    All other components within normal limits  I-STAT CG4 LACTIC ACID, ED - Abnormal; Notable for the following:    Lactic Acid, Venous 3.60 (*)    All other components within normal limits  CULTURE, BLOOD (ROUTINE X 2)  CULTURE, BLOOD (ROUTINE X 2)  URINE CULTURE  COMPREHENSIVE METABOLIC PANEL  URINALYSIS, ROUTINE W REFLEX MICROSCOPIC  I-STAT CG4 LACTIC ACID, ED    Imaging Review No results found.   EKG Interpretation None       MDM   Final diagnoses:  Fever, unspecified fever cause    [name] is a 38 year old female with a history of T4a N2a colon cancer (status post right colectomy in July 2015, currently on adjuvant FOLFOX) who presents with a fever. Likely secondary to infection in the context of chemotherapy, though the source is unclear.  - 2 L NS bolus  - Blood cultures, chest x-ray, urinalysis, lactic acid  - empiric levofloxacin, aztreonam, and vancomycin   - Lactate elevated at 3.6.   - No leukocytosis. [place] 570.    - Hospitalist to admit.  Luan Moore, MD  09/24/13 2321 

## 2013-09-24 NOTE — Telephone Encounter (Signed)
Pt called reports "I had chemo treatment today and my hands are numb"  Pt states that numb feeling started in right hand and went up to her elbow and now is in both hands.  Per Dr. Benay Spice; explained to pt that numb/tingling feeling is side effect caused by the oxaliplatin and will take time to resolve.  Instructed pt to stay away for air conditioning vents that blowing out cold air; wear oven mitts if getting anything out of freezer or refrigerator, etc.  Pt verbalized understanding and confirmed she will call if further problems.

## 2013-09-24 NOTE — ED Notes (Signed)
Pt. Made aware for the need of urine. 

## 2013-09-24 NOTE — Telephone Encounter (Signed)
Pt confirmed labs/ov per 08/20 POF, sent msg to add chemo, gave pt AVS....KJ

## 2013-09-25 ENCOUNTER — Inpatient Hospital Stay (HOSPITAL_COMMUNITY): Payer: No Typology Code available for payment source

## 2013-09-25 ENCOUNTER — Other Ambulatory Visit: Payer: Self-pay

## 2013-09-25 DIAGNOSIS — T451X5A Adverse effect of antineoplastic and immunosuppressive drugs, initial encounter: Secondary | ICD-10-CM

## 2013-09-25 DIAGNOSIS — D6959 Other secondary thrombocytopenia: Secondary | ICD-10-CM | POA: Diagnosis present

## 2013-09-25 DIAGNOSIS — T50905A Adverse effect of unspecified drugs, medicaments and biological substances, initial encounter: Secondary | ICD-10-CM | POA: Diagnosis present

## 2013-09-25 DIAGNOSIS — A419 Sepsis, unspecified organism: Secondary | ICD-10-CM | POA: Diagnosis present

## 2013-09-25 DIAGNOSIS — R509 Fever, unspecified: Secondary | ICD-10-CM

## 2013-09-25 DIAGNOSIS — R6520 Severe sepsis without septic shock: Secondary | ICD-10-CM

## 2013-09-25 DIAGNOSIS — R1084 Generalized abdominal pain: Secondary | ICD-10-CM

## 2013-09-25 DIAGNOSIS — D509 Iron deficiency anemia, unspecified: Secondary | ICD-10-CM

## 2013-09-25 DIAGNOSIS — C182 Malignant neoplasm of ascending colon: Secondary | ICD-10-CM

## 2013-09-25 DIAGNOSIS — T50905S Adverse effect of unspecified drugs, medicaments and biological substances, sequela: Secondary | ICD-10-CM

## 2013-09-25 DIAGNOSIS — D6481 Anemia due to antineoplastic chemotherapy: Secondary | ICD-10-CM | POA: Diagnosis present

## 2013-09-25 DIAGNOSIS — R109 Unspecified abdominal pain: Secondary | ICD-10-CM

## 2013-09-25 LAB — COMPREHENSIVE METABOLIC PANEL
ALK PHOS: 73 U/L (ref 39–117)
ALT: 17 U/L (ref 0–35)
AST: 17 U/L (ref 0–37)
Albumin: 3.3 g/dL — ABNORMAL LOW (ref 3.5–5.2)
Anion gap: 13 (ref 5–15)
BUN: 12 mg/dL (ref 6–23)
CO2: 21 mEq/L (ref 19–32)
Calcium: 8.9 mg/dL (ref 8.4–10.5)
Chloride: 103 mEq/L (ref 96–112)
Creatinine, Ser: 0.73 mg/dL (ref 0.50–1.10)
GFR calc non Af Amer: >90 mL/min (ref >90)
Glucose, Bld: 149 mg/dL — ABNORMAL HIGH (ref 70–99)
POTASSIUM: 4.2 meq/L (ref 3.7–5.3)
SODIUM: 137 meq/L (ref 137–147)
TOTAL PROTEIN: 6.4 g/dL (ref 6.0–8.3)
Total Bilirubin: 0.6 mg/dL (ref 0.3–1.2)

## 2013-09-25 LAB — PROTIME-INR
INR: 1.19 (ref 0.00–1.49)
Prothrombin Time: 15.1 seconds (ref 11.6–15.2)

## 2013-09-25 LAB — MAGNESIUM: Magnesium: 1.3 mg/dL — ABNORMAL LOW (ref 1.5–2.5)

## 2013-09-25 LAB — URINALYSIS, ROUTINE W REFLEX MICROSCOPIC
Bilirubin Urine: NEGATIVE
Glucose, UA: 100 mg/dL — AB
Hgb urine dipstick: NEGATIVE
KETONES UR: NEGATIVE mg/dL
LEUKOCYTES UA: NEGATIVE
NITRITE: NEGATIVE
Protein, ur: NEGATIVE mg/dL
Specific Gravity, Urine: 1.022 (ref 1.005–1.030)
Urobilinogen, UA: 1 mg/dL (ref 0.0–1.0)
pH: 8 (ref 5.0–8.0)

## 2013-09-25 LAB — PHOSPHORUS: Phosphorus: 1.8 mg/dL — ABNORMAL LOW (ref 2.3–4.6)

## 2013-09-25 LAB — TSH: TSH: 0.391 u[IU]/mL (ref 0.350–4.500)

## 2013-09-25 LAB — CBC WITH DIFFERENTIAL/PLATELET
Basophils Absolute: 0 10*3/uL (ref 0.0–0.1)
Basophils Relative: 0 % (ref 0–1)
EOS PCT: 1 % (ref 0–5)
Eosinophils Absolute: 0 10*3/uL (ref 0.0–0.7)
HEMATOCRIT: 32.4 % — AB (ref 36.0–46.0)
HEMOGLOBIN: 10.4 g/dL — AB (ref 12.0–15.0)
LYMPHS ABS: 0.1 10*3/uL — AB (ref 0.7–4.0)
LYMPHS PCT: 3 % — AB (ref 12–46)
MCH: 22.9 pg — ABNORMAL LOW (ref 26.0–34.0)
MCHC: 32.1 g/dL (ref 30.0–36.0)
MCV: 71.4 fL — AB (ref 78.0–100.0)
MONO ABS: 0.3 10*3/uL (ref 0.1–1.0)
MONOS PCT: 5 % (ref 3–12)
Neutro Abs: 4.6 10*3/uL (ref 1.7–7.7)
Neutrophils Relative %: 91 % — ABNORMAL HIGH (ref 43–77)
Platelets: 121 10*3/uL — ABNORMAL LOW (ref 150–400)
RBC: 4.54 MIL/uL (ref 3.87–5.11)
RDW: 20.1 % — AB (ref 11.5–15.5)
WBC: 5 10*3/uL (ref 4.0–10.5)

## 2013-09-25 LAB — APTT: aPTT: 29 seconds (ref 24–37)

## 2013-09-25 MED ORDER — FLUOROURACIL CHEMO INJECTION 5 GM/100ML
991.0000 mg/m2 | Freq: Once | INTRAVENOUS | Status: AC
  Administered 2013-09-25: 1900 mg via INTRAVENOUS
  Filled 2013-09-25: qty 38

## 2013-09-25 MED ORDER — K PHOS MONO-SOD PHOS DI & MONO 155-852-130 MG PO TABS
500.0000 mg | ORAL_TABLET | Freq: Two times a day (BID) | ORAL | Status: DC
  Administered 2013-09-25 – 2013-09-26 (×3): 500 mg via ORAL
  Filled 2013-09-25 (×4): qty 2

## 2013-09-25 MED ORDER — VANCOMYCIN HCL IN DEXTROSE 1-5 GM/200ML-% IV SOLN
1000.0000 mg | Freq: Three times a day (TID) | INTRAVENOUS | Status: DC
  Administered 2013-09-25 – 2013-09-26 (×4): 1000 mg via INTRAVENOUS
  Filled 2013-09-25 (×6): qty 200

## 2013-09-25 MED ORDER — LEVOFLOXACIN IN D5W 750 MG/150ML IV SOLN
750.0000 mg | Freq: Every day | INTRAVENOUS | Status: DC
  Administered 2013-09-25: 750 mg via INTRAVENOUS
  Filled 2013-09-25 (×2): qty 150

## 2013-09-25 MED ORDER — KETOROLAC TROMETHAMINE 15 MG/ML IJ SOLN
30.0000 mg | Freq: Four times a day (QID) | INTRAMUSCULAR | Status: DC | PRN
  Administered 2013-09-25 – 2013-09-26 (×3): 30 mg via INTRAVENOUS
  Filled 2013-09-25: qty 1
  Filled 2013-09-25: qty 2
  Filled 2013-09-25: qty 1

## 2013-09-25 MED ORDER — MAGNESIUM SULFATE 40 MG/ML IJ SOLN
2.0000 g | Freq: Once | INTRAMUSCULAR | Status: AC
  Administered 2013-09-25: 2 g via INTRAVENOUS
  Filled 2013-09-25: qty 50

## 2013-09-25 MED ORDER — DEXTROSE 5 % IV SOLN
2.0000 g | Freq: Three times a day (TID) | INTRAVENOUS | Status: DC
  Administered 2013-09-25 – 2013-09-26 (×4): 2 g via INTRAVENOUS
  Filled 2013-09-25 (×5): qty 2

## 2013-09-25 MED ORDER — DIPHENHYDRAMINE HCL 50 MG/ML IJ SOLN
25.0000 mg | Freq: Four times a day (QID) | INTRAMUSCULAR | Status: DC | PRN
  Administered 2013-09-25 (×2): 25 mg via INTRAVENOUS
  Filled 2013-09-25 (×2): qty 1

## 2013-09-25 NOTE — Progress Notes (Signed)
Brief Pharmacy Note  Pt admitted 8/20 with outpatient 5-FU pump for continuation of chemotherapy given 8/20 at [facility] Outpatient pump will not be continued inpatient. New chemotherapy to complete infusion will be mixed by Pharmacy. Patient will have completed 27 out of 46 hours of infusion by the time new bag is hung.  New orders will be entered for the remaining 19 hours. Original dose basis= 2400 mg/m2 over 46h New bag dose of 991 mg/m2 over 19h New bag will contain 1900mg  of 5-FU in 1L D5W to run over 19 hours  Orders entered after discussion with Dr. Benay Spice (patient's oncologist) IV team to disconnect outpatient pump prior to hanging new bag of chemotherapy Chemo certified nurse to hang new chemotherapy bag at [facility] discussed with Gregary Signs, RN  Currie Paris, PharmD, [place] Pager: [phone] Pharmacy: [phone] 09/25/2013 2:04 PM

## 2013-09-25 NOTE — Care Management Note (Signed)
CARE MANAGEMENT NOTE 09/25/2013  Patient:  [name], [name]   Account Number:  [id number]  Date Initiated:  09/25/2013  Documentation initiated by:  Leafy Kindle  Subjective/Objective Assessment:   38 yo pt in with fever and abd pain     Action/Plan:   From home with family   Anticipated DC Date:  09/28/2013   Anticipated DC Plan:  [place]  CM consult      Choice offered to / List presented to:             Status of service:  In process, will continue to follow Medicare Important Message given?   (If response is "NO", the following Medicare IM given date fields will be blank) Date Medicare IM given:   Medicare IM given by:   Date Additional Medicare IM given:   Additional Medicare IM given by:    Discharge Disposition:    Per UR Regulation:  Reviewed for med. necessity/level of care/duration of stay  If discussed at [place] of Stay Meetings, dates discussed:    Comments:  09/25/13 Marney Doctor RN,BSN,NCM Pt receiving IV abx and fluids.  Also receiving Chemo.  No CM needs noted at this time.  Will follow for DC needs.

## 2013-09-25 NOTE — Progress Notes (Signed)
Progress Note   [name] PJK:[id number] DOB: 12-20-1975 DOA: 2013/09/30 PCP: Nance Pear., NP   Brief Narrative:   [name] is an 38 y.o. female with a PMH of seizure disorder, recently diagnosed with colon cancer status post right colectomy 08/2013, under the care of Dr. Benay Spice for chemotherapy with FOLFOX, last dose given 2013/09/30, who was admitted the same day with a chief complaint of nausea, abdominal discomfort and fever. Upon initial evaluation, BP was 170/60, heart rate 131, and temperature max was 102.82F.  Assessment/Plan:   Principal Problem:   Severe sepsis versus ADR to oxaliplatin  If sepsis, source not entirely clear. Continue broad-spectrum antibiotics with Levaquin, aztreonam and vancomycin.  Urine cultures, blood cultures pending.  Active Problems:   DEPRESSION/ANXIETY  Continue Ativan.    Primary generalized seizure disorder  Continue Lamictal.    Cancer of ascending colon s/p right colectomy 08/11/2013  Cycle 1 adjuvant FOLFOX 09/08/2013.  Dr. Benay Spice following.     Abdominal pain  Improved.  No abnormalities on plain films.    Anemia due to antineoplastic chemotherapy/Chemotherapy induced thrombocytopenia  No current indication for transfusion.  Continue iron supplement.    Hypomagnesemia  2 grams IV magnesium ordered.    Hypophosphatemia  Supplement.    DVT Prophylaxis  Code Status: Full. Family Communication: Multiple family updated at bedside. Disposition Plan: Home when stable.   IV Access:    Porta-Cath   Procedures and diagnostic studies:    Dg Chest Port 1 View September 30, 2013:: No acute cardiopulmonary findings.     Dg Abd Portable 1v 09/25/2013: Negative.    Medical Consultants:    Dr. Julieanne Manson, Oncology.   Other Consultants:    None.   Anti-Infectives:    Vancomycin 09-30-13--->  Aztreonam 30-Sep-2013--->  Levaquin 09/25/13--->   Subjective:    [name] still feels  nauseated, abdominal pain better, complains of headache.  No fever/chills since this a.m.  Objective:    Filed Vitals:   09-30-13 2344 09/25/13 0036 09/25/13 0040 09/25/13 0535  BP: 114/63 120/65  118/61  Pulse: 108   66  Temp: 102.1 F (38.9 C) 102.4 F (39.1 C)  98 F (36.7 C)  TempSrc: Oral Oral  Oral  Resp: 20 24  22   Height:   5\' 6"  (1.676 m)   Weight:   80.74 kg (178 lb)   SpO2: 99% 99%  98%    Intake/Output Summary (Last 24 hours) at 09/25/13 0841 Last data filed at 09/25/13 0604  Gross per 24 hour  Intake 1571.67 ml  Output   1550 ml  Net  21.67 ml    Exam: Gen:  NAD Cardiovascular:  RRR, No M/R/G Respiratory:  Lungs CTAB Gastrointestinal:  Abdomen soft, NT/ND, + BS Extremities:  No C/E/C   Data Reviewed:    Labs: Basic Metabolic Panel:  Recent Labs Lab 30-Sep-2013 0902 Sep 30, 2013 2233 09/25/13 0109  NA 140 135* 137  K 3.8 4.1 4.2  CL  --  98 103  CO2 23 20 21   GLUCOSE 147* 181* 149*  BUN 10.4 12 12   CREATININE 0.8 0.73 0.73  CALCIUM 9.4 9.9 8.9  MG  --   --  1.3*  PHOS  --   --  1.8*   GFR Estimated Creatinine Clearance: 101.2 ml/min (by C-G formula based on Cr of 0.73). Liver Function Tests:  Recent Labs Lab 30-Sep-2013 0902 09-30-2013 2233 09/25/13 0109  AST 18 25 17   ALT 18 21 17   ALKPHOS 83 81  73  BILITOT 0.51 0.6 0.6  PROT 6.9 7.3 6.4  ALBUMIN 3.7 3.7 3.3*   Coagulation profile  Recent Labs Lab 09/25/13 0109  INR 1.19    CBC:  Recent Labs Lab 09/24/13 0902 09/24/13 2233 09/25/13 0109  WBC 5.5 6.2 5.0  NEUTROABS 2.9 5.7 4.6  HGB 11.6 10.9* 10.4*  HCT 36.9 34.8* 32.4*  MCV 71.4* 72.0* 71.4*  PLT 182 128* 121*   Thyroid function studies:  Recent Labs  09/25/13 0109  TSH 0.391   Sepsis Labs:  Recent Labs Lab 09/24/13 0902 09/24/13 2233 09/24/13 2254 09/25/13 0109  WBC 5.5 6.2  --  5.0  LATICACIDVEN  --   --  3.60*  --    Microbiology No results found for this or any previous visit (from the past 240  hour(s)).   Medications:   . aztreonam  2 g Intravenous Q8H  . ferrous fumarate  1 tablet Oral Daily  .  HYDROmorphone (DILAUDID) injection  0.5 mg Intravenous Once  . lamoTRIgine  100 mg Oral BID  . levofloxacin (LEVAQUIN) IV  750 mg Intravenous QHS  . pantoprazole  40 mg Oral Daily  . rOPINIRole  4 mg Oral QHS  . sodium chloride  3 mL Intravenous Q12H  . vancomycin  1,000 mg Intravenous 3 times per day   Continuous Infusions: . sodium chloride 125 mL/hr at 09/24/13 2232  . sodium chloride 1,000 mL (09/25/13 0036)  . sodium chloride 125 mL/hr at 09/25/13 0036    Time spent: 25 minutes.   LOS: 1 day   Stephan Nelis  [facility] Pager [phone]. If unable to reach me by pager, please call my cell phone at [phone].  *Please refer to [email or web address], password [email or web address] to get updated schedule on who will round on this patient, as hospitalists switch teams weekly. If 7PM-7AM, please contact night-coverage at [email or web address], password [email or web address] for any overnight needs.  09/25/2013, 8:41 AM

## 2013-09-25 NOTE — Progress Notes (Signed)
ANTIBIOTIC CONSULT NOTE - INITIAL  Pharmacy Consult for vancomycin, aztreonam, levofloxacin Indication: rule out sepsis  Allergies  Allergen Reactions  . Eggs Or Egg-Derived Products Nausea And Vomiting  . Metoclopramide Hcl Other (See Comments)    EXACERBATES RESTLESS LEG SYNDROME  . Oxycodone-Acetaminophen Hives  . Sulfa Antibiotics Nausea And Vomiting  . Penicillins Itching    Patient Measurements: Height: 5\' 6"  (167.6 cm) Weight: 178 lb (80.74 kg) IBW/kg (Calculated) : 59.3 Adjusted Body Weight:   Vital Signs: Temp: 102.4 F (39.1 C) (08/21 0036) Temp src: Oral (08/21 0036) BP: 120/65 mmHg (08/21 0036) Pulse Rate: 108 (08/20 2344) Intake/Output from previous day: 08/20 0701 - 08/21 0700 In: -  Out: 750 [Urine:750] Intake/Output from this shift: Total I/O In: -  Out: 750 [Urine:750]  Labs:  Recent Labs  09/24/13 0902 09/24/13 0902 09/24/13 2233 09/25/13 0109  WBC 5.5  --  6.2 5.0  HGB 11.6  --  10.9* 10.4*  PLT 182  --  128* 121*  CREATININE  --  0.8 0.73 0.73   Estimated Creatinine Clearance: 101.2 ml/min (by C-G formula based on Cr of 0.73). No results found for this basename: VANCOTROUGH, VANCOPEAK, VANCORANDOM, GENTTROUGH, GENTPEAK, GENTRANDOM, TOBRATROUGH, TOBRAPEAK, TOBRARND, AMIKACINPEAK, AMIKACINTROU, AMIKACIN,  in the last 72 hours   Microbiology: No results found for this or any previous visit (from the past 720 hour(s)).  Medical History: Past Medical History  Diagnosis Date  . GERD (gastroesophageal reflux disease)   . Seizures     last seizure 08/2012  . History of migraine     no problems in "a long time"  . Anemia, iron deficiency   . Restless leg syndrome   . Colon cancer 08/2013  . History of blood transfusion 08/16/2013  . Difficulty swallowing pills     Medications:  Anti-infectives   Start     Dose/Rate Route Frequency Ordered Stop   09/25/13 2200  levofloxacin (LEVAQUIN) IVPB 750 mg     750 mg 100 mL/hr over 90 Minutes  Intravenous Daily at bedtime 09/25/13 0324     09/25/13 0800  aztreonam (AZACTAM) 2 g in dextrose 5 % 50 mL IVPB     2 g 100 mL/hr over 30 Minutes Intravenous Every 8 hours 09/25/13 0324     09/25/13 0700  vancomycin (VANCOCIN) IVPB 1000 mg/200 mL premix     1,000 mg 200 mL/hr over 60 Minutes Intravenous 3 times per day 09/25/13 0324     09/24/13 2245  levofloxacin (LEVAQUIN) IVPB 750 mg     750 mg 100 mL/hr over 90 Minutes Intravenous  Once 09/24/13 2238 09/25/13 0034   09/24/13 2245  aztreonam (AZACTAM) 2 g in dextrose 5 % 50 mL IVPB     2 g 100 mL/hr over 30 Minutes Intravenous  Once 09/24/13 2238 09/25/13 0242   09/24/13 2245  vancomycin (VANCOCIN) IVPB 1000 mg/200 mL premix     1,000 mg 200 mL/hr over 60 Minutes Intravenous  Once 09/24/13 2238 09/25/13 0207     Assessment: Patient with fever and recent chemo.  First dose of antibiotics already given.   Goal of Therapy:  Vancomycin trough level 15-20 mcg/ml Levofloxacin, aztreonam dosed based on patient weight and renal function  Plan:  Measure antibiotic drug levels at steady state Follow up culture results Vancomycin 1gm iv q8hr Aztreonam 2gm iv q8hr,  Levofloxacin 750mg  iv q24hr      Nani Skillern Crowford 09/25/2013,3:24 AM

## 2013-09-25 NOTE — Progress Notes (Signed)
IP PROGRESS NOTE  Subjective:   She completed a second cycle of FOLFOX yesterday. She developed hand numbness after returning home from chemotherapy. This has resolved. She presents emergency room last night with abdominal pain and a fever. She reports the pain occurred after a bowel movement. No specific complaint this morning. No rash.  Objective: Vital signs in last 24 hours: Blood pressure 118/61, pulse 66, temperature 98 F (36.7 C), temperature source Oral, resp. rate 22, height _0  (1.676 m), weight 178 lb (80.74 kg), SpO2 98.00%.  Intake/Output from previous day: 08/20 0701 - 08/21 0700 In: 1571.7 [P.O.:180; I.V.:941.7; IV Piggyback:450] Out: 5462 [Urine:1550]  Physical Exam:  HEENT: No thrush Lungs: Clear bilaterally Cardiac: Regular rate and rhythm Abdomen: No hepatomegaly, soft, mild tenderness in the bilateral lower abdomen Extremities: No leg edema Skin: No rash  Portacath/PICC-without erythema  Lab Results:  Recent Labs  09/24/13 2233 09/25/13 0109  WBC 6.2 5.0  HGB 10.9* 10.4*  HCT 34.8* 32.4*  PLT 128* 121*    BMET  Recent Labs  09/24/13 2233 09/25/13 0109  NA 135* 137  K 4.1 4.2  CL 98 103  CO2 20 21  GLUCOSE 181* 149*  BUN 12 12  CREATININE 0.73 0.73  CALCIUM 9.9 8.9    Studies/Results: Dg Chest Port 1 View  09/24/2013   CLINICAL DATA:  Fever, shortness of breath and chest pain  EXAM: PORTABLE CHEST - 1 VIEW  COMPARISON:  09/09/2013  FINDINGS: The power port is stable. The cardiac silhouette, mediastinal and hilar contours are normal. The lungs are clear. No pleural effusion. The bony thorax is intact.  IMPRESSION: No acute cardiopulmonary findings.   Electronically Signed   By: Kalman Jewels M.D.   On: 09/24/2013 23:30   Dg Abd Portable 1v  09/25/2013   CLINICAL DATA:  Left lower quadrant abdominal pain  EXAM: PORTABLE ABDOMEN - 1 VIEW  COMPARISON:  07/21/2013  FINDINGS: Imaged portions of the abdomen are negative for bowel  obstruction. No concerning intra-abdominal mass effect or calcification. Bone island in the left iliac wing.  IMPRESSION: Negative.   Electronically Signed   By: Jorje Guild M.D.   On: 09/25/2013 02:33    Medications: I have reviewed the patient's current medications.  Assessment/Plan:  1. 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 iron deficiency anemia  3. seizure disorder  4. history of depression  5. 4 mm right lower lobe nodule on a staging chest CT 09/08/2013  6. admission with fever and abdominal pain 09/24/2013   [name] was admitted last night with a high fever and abdominal pain. The fever has resolved. I suspect her symptoms are related to chemotherapy. No apparent source for an infection.  She appears well this morning. Recommendations: 1. Continue infusional 5-fluorouracil to complete cycle 2 FOLFOX 2. consider discharge to home if she remains afebrile and cultures are negative today  Please call oncology as needed.   LOS: 1 day   Arron Tetrault, Dominica Severin  09/25/2013, 8:43 AM

## 2013-09-26 MED ORDER — HEPARIN SOD (PORK) LOCK FLUSH 100 UNIT/ML IV SOLN
500.0000 [IU] | INTRAVENOUS | Status: AC | PRN
  Administered 2013-09-26: 500 [IU]

## 2013-09-26 MED ORDER — LORAZEPAM 1 MG PO TABS
1.0000 mg | ORAL_TABLET | Freq: Three times a day (TID) | ORAL | Status: DC | PRN
  Administered 2013-09-26: 1 mg via ORAL
  Filled 2013-09-26: qty 1

## 2013-09-26 NOTE — Discharge Summary (Signed)
Physician Discharge Summary  [name] EXH:[id number] DOB: 11/18/1975 DOA: 09/24/2013  PCP: Nance Pear., NP  Admit date: 09/24/2013 Discharge date: 09/26/2013   Recommendations for Outpatient Follow-Up:   1. Recommend close followup with oncology. Followup blood cultures/urine culture. Note: Patient discharged without further antibiotic therapy.   Discharge Diagnosis:   Principal Problem:    Adverse drug effect Active Problems:    DEPRESSION/ANXIETY    Primary generalized seizure disorder    Cancer of ascending colon s/p right colectomy 08/11/2013    Abdominal pain    Anemia due to antineoplastic chemotherapy    Chemotherapy induced thrombocytopenia    Hypomagnesemia    Hypophosphatemia  Discharge Condition: Improved.  Diet recommendation: Regular.   History of Present Illness:   [name] is an 38 y.o. female with a PMH of seizure disorder, recently diagnosed with colon cancer status post right colectomy 08/2013, under the care of Dr. Benay Spice for chemotherapy with FOLFOX, last dose given 09/24/13, who was admitted the same day with a chief complaint of nausea, abdominal discomfort and fever. Upon initial evaluation, BP was 170/60, heart rate 131, and temperature max was 102.14F.   Hospital Course by Problem:   Principal Problem:  Severe sepsis versus ADR to oxaliplatin  If sepsis, source wasn't entirely clear. Treated with broad-spectrum antibiotics with Levaquin, aztreonam and vancomycin, but given the rapid resolution of symptoms, presenting symptoms felt to be more a reaction to oxaliplatin than a reflection of sepsis.  Urine cultures, blood cultures pending.  Active Problems:  DEPRESSION/ANXIETY  Continue Ativan.  Primary generalized seizure disorder  Continue Lamictal.  Cancer of ascending colon s/p right colectomy 08/11/2013  Cycle 1 adjuvant FOLFOX 09/08/2013.  Dr. Benay Spice following.   Abdominal pain  Improved. No abnormalities  on plain films.  Anemia due to antineoplastic chemotherapy/Chemotherapy induced thrombocytopenia  No current indication for transfusion.  Continue iron supplement.  Hypomagnesemia  2 grams IV magnesium given prior to discharge.  Hypophosphatemia  Supplemented with potassium phosphate.   Medical Consultants:    Dr. Julieanne Manson, Oncology.   Discharge Exam:   Filed Vitals:   09/26/13 0800  BP:   Pulse:   Temp: 98.3 F (36.8 C)  Resp:    Filed Vitals:   09/25/13 1900 09/25/13 2157 09/26/13 0533 09/26/13 0800  BP: 106/64 120/76 111/76   Pulse: 60 60 66   Temp: 98.3 F (36.8 C) 97.9 F (36.6 C) 98.1 F (36.7 C) 98.3 F (36.8 C)  TempSrc: Oral Oral Oral Oral  Resp: 20 20 18    Height:      Weight:   82.3 kg (181 lb 7 oz)   SpO2: 100% 100% 100%     Gen:  NAD Cardiovascular:  RRR, No M/R/G Respiratory: Lungs CTAB Gastrointestinal: Abdomen soft, NT/ND with normal active bowel sounds. Extremities: No C/E/C   The results of significant diagnostics from this hospitalization (including imaging, microbiology, ancillary and laboratory) are listed below for reference.     Procedures and Diagnostic Studies:   Dg Chest [facility] 09/24/2013:: No acute cardiopulmonary findings.  Dg Abd Portable 1v 09/25/2013: Negative.   Labs:   Basic Metabolic Panel:  Recent Labs Lab 09/24/13 0902 09/24/13 2233 09/25/13 0109  NA 140 135* 137  K 3.8 4.1 4.2  CL  --  98 103  CO2 23 20 21   GLUCOSE 147* 181* 149*  BUN 10.4 12 12   CREATININE 0.8 0.73 0.73  CALCIUM 9.4 9.9 8.9  MG  --   --  1.3*  PHOS  --   --  1.8*   GFR Estimated Creatinine Clearance: 102.1 ml/min (by C-G formula based on Cr of 0.73). Liver Function Tests:  Recent Labs Lab 09/24/13 0902 09/24/13 2233 09/25/13 0109  AST 18 25 17   ALT 18 21 17   ALKPHOS 83 81 73  BILITOT 0.51 0.6 0.6  PROT 6.9 7.3 6.4  ALBUMIN 3.7 3.7 3.3*   Coagulation profile  Recent Labs Lab 09/25/13 0109  INR 1.19     CBC:  Recent Labs Lab 09/24/13 0902 09/24/13 2233 09/25/13 0109  WBC 5.5 6.2 5.0  NEUTROABS 2.9 5.7 4.6  HGB 11.6 10.9* 10.4*  HCT 36.9 34.8* 32.4*  MCV 71.4* 72.0* 71.4*  PLT 182 128* 121*   Thyroid function studies  Recent Labs  09/25/13 0109  TSH 0.391   Microbiology Recent Results (from the past 240 hour(s))  CULTURE, BLOOD (ROUTINE X 2)     Status: None   Collection Time    09/24/13 10:33 PM      Result Value Ref Range Status   Specimen Description BLOOD LEFT HAND   Final   Special Requests BOTTLES DRAWN AEROBIC AND ANAEROBIC 4CC   Final   Culture  Setup Time     Final   Value: 09/25/2013 01:18     Performed at [facility]   Culture     Final   Value:        BLOOD CULTURE RECEIVED NO GROWTH TO DATE CULTURE WILL BE HELD FOR 5 DAYS BEFORE ISSUING A FINAL NEGATIVE REPORT     Performed at [facility]   Report Status PENDING   Incomplete  CULTURE, BLOOD (ROUTINE X 2)     Status: None   Collection Time    09/24/13 10:33 PM      Result Value Ref Range Status   Specimen Description BLOOD RIGHT HAND   Final   Special Requests BOTTLES DRAWN AEROBIC AND ANAEROBIC 5CC   Final   Culture  Setup Time     Final   Value: 09/25/2013 01:17     Performed at [facility]   Culture     Final   Value:        BLOOD CULTURE RECEIVED NO GROWTH TO DATE CULTURE WILL BE HELD FOR 5 DAYS BEFORE ISSUING A FINAL NEGATIVE REPORT     Performed at [facility]   Report Status PENDING   Incomplete  URINE CULTURE     Status: None   Collection Time    09/25/13 12:05 AM      Result Value Ref Range Status   Specimen Description URINE, CLEAN CATCH   Final   Special Requests NONE   Final   Culture  Setup Time     Final   Value: 09/25/2013 05:43     Performed at [place] PENDING   Incomplete   Culture     Final   Value: Culture reincubated for better growth     Performed at [facility]   Report Status PENDING    Incomplete     Discharge Instructions:       Discharge Instructions   Activity as tolerated - No restrictions    Complete by:  As directed      Call MD for:  extreme fatigue    Complete by:  As directed      Call MD for:  persistant nausea and vomiting  Complete by:  As directed      Call MD for:  severe uncontrolled pain    Complete by:  As directed      Call MD for:  temperature >100.4    Complete by:  As directed      Diet general    Complete by:  As directed      Discharge instructions    Complete by:  As directed   Make sure you follow up with Dr. Benay Spice for any non-resolution of symptoms, or new symptoms.            Medication List         ferrous fumarate 325 (106 FE) MG Tabs tablet  Commonly known as:  HEMOCYTE  Take 1 tablet (106 mg of iron total) by mouth daily.     lamoTRIgine 100 MG tablet  Commonly known as:  LAMICTAL  Take 1 tablet (100 mg total) by mouth 2 (two) times daily.     lidocaine-prilocaine cream  Commonly known as:  EMLA  Apply 1 application topically as needed. Apply 1 teaspoon to PAC site 2 hours prior to stick and cover with plastic wrap to numb site     LORazepam 1 MG tablet  Commonly known as:  ATIVAN  Take 1 tablet (1 mg total) by mouth at bedtime as needed for anxiety or sleep.     omeprazole 20 MG tablet  Commonly known as:  PRILOSEC OTC  Take 20 mg by mouth daily.     ondansetron 8 MG tablet  Commonly known as:  ZOFRAN  Take 1 tablet (8 mg total) by mouth every 8 (eight) hours as needed for nausea or vomiting.     rOPINIRole 4 MG tablet  Commonly known as:  REQUIP  Take 1 tablet (4 mg total) by mouth at bedtime.       Follow-up Information   Schedule an appointment as soon as possible for a visit with Nance Pear., NP. (As needed)    Specialty:  Internal Medicine   Contact information:   [place] [address] [phone]       Schedule an appointment as soon as possible for a  visit with Betsy Coder, MD. (in 1 week for blood work.)    Specialty:  Oncology   Contact information:   [place] [address] [phone]        Time coordinating discharge: 25 minutes.  Signed:  Zennie Ayars  Pager [phone] [facility] 09/26/2013, 2:25 PM

## 2013-09-26 NOTE — Progress Notes (Signed)
Pt c/o of burning at IV peripheral site.Levaquin currently infusing. No hives, or rashes noted. [name], NP notified. Instructed to go ahead and stop Levaquin. Will continue to monitor. [name]

## 2013-09-26 NOTE — Discharge Instructions (Signed)
Chemotherapy  Many people are apprehensive about chemotherapy due to concerns over uncomfortable side effects. However, managements for side effects have come a long way. Many side effects once associated with chemotherapy can be prevented and/or controlled.  WHAT IS CHEMOTHERAPY?  Chemotherapy is the general term for any treatment involving the use of chemical agents. Chemotherapy can be given through a vein, most commonly through an implanted port* or PICC line.* It can also be delivered by mouth (orally) in the form of a pill. The main goal of chemotherapy is to kill cancer cells and stop them from growing. It can destroy and eliminate cancer cells where the cancer started (primary tumor location) and throughout the body, often far away from the original cancer. It is a treatment that not only targets the original cancer location, but also the entire body (systemic treatment) for full effect and results.  Chemotherapy works by destroying cancer cells. Unfortunately, it cannot tell the difference between a cancer cell and some healthy cells. This results in the death of noncancerous cells, such as hair and blood cells. Harm to healthy cells is what causes side effects. These cells usually repair themselves after chemotherapy.  Because some drugs work better together rather than alone, 2 or more drugs are often given at the same time. This is called combination chemotherapy.  Depending on the type of cancer and how advanced it is, chemotherapy can be used for different goals:  · Cure the cancer.  · Keep the cancer from spreading.  · Slow the cancer's growth.  · Kill cancer cells that may have spread to other parts of the body from the original tumor.  · Relieve symptoms caused by cancer.  You and your caregiver will decide what drug or combination of drugs you will get. Your caregiver will choose the doses, how the drugs will be given, how often, and how long you will get treatment. All of these decisions will  depend on the type of cancer, where it is, how big it is, and how it is affecting your normal body functions and overall health.  *Implanted port - A device that is implanted under your skin so that medicines may be delivered directly into your blood system.  *PICC line (peripherally inserted central catheter) - A long, slender, flexible tube. This tube is often inserted into a vein, typically in the upper arm. The tip stops in the large central vein that leads to your heart.  Document Released: 11/19/2006 Document Revised: 04/16/2011 Document Reviewed: 05/06/2008  ExitCare® Patient Information ©2015 [employer]. This information is not intended to replace advice given to you by your health care provider. Make sure you discuss any questions you have with your health care provider.

## 2013-09-26 NOTE — Progress Notes (Signed)
Pt c/o itching at peripheral IV site, Levaquin currently infusing. Redressed IV site. Benadryl given as ordered. [name], NP notified. No new orders at this time. Will continue to monitor. Wendee Beavers [place]

## 2013-09-28 ENCOUNTER — Encounter: Payer: Self-pay | Admitting: Certified Registered Nurse Anesthetist

## 2013-09-28 ENCOUNTER — Telehealth: Payer: Self-pay | Admitting: Family

## 2013-09-28 LAB — URINE CULTURE: Colony Count: 35000

## 2013-09-28 NOTE — Telephone Encounter (Signed)
Appointment scheduled for 09/29/13

## 2013-09-28 NOTE — Telephone Encounter (Signed)
Please schedule pt for hospital follow up.

## 2013-09-28 NOTE — Progress Notes (Signed)
Pt. came in to return 60fu pump. Pt. was admitted in the hospital and pump was removed on 09/25/2013. Volume left in pump 182ml and given 171ml.  HL

## 2013-09-29 ENCOUNTER — Other Ambulatory Visit: Payer: Self-pay | Admitting: *Deleted

## 2013-09-29 ENCOUNTER — Ambulatory Visit: Payer: Self-pay | Admitting: Family

## 2013-09-29 DIAGNOSIS — Z0289 Encounter for other administrative examinations: Secondary | ICD-10-CM

## 2013-09-29 MED ORDER — LORAZEPAM 1 MG PO TABS: 1.0000 mg | ORAL_TABLET | Freq: Two times a day (BID) | ORAL | Status: DC | PRN

## 2013-09-29 MED ORDER — LORAZEPAM 1 MG PO TABS: 1.0000 mg | ORAL_TABLET | Freq: Three times a day (TID) | ORAL | Status: DC | PRN

## 2013-09-29 NOTE — Telephone Encounter (Signed)
Per Dr. Benay Spice; notified pt that Ativan re-fill per request will be ready for pick-up today and Dr. Benay Spice ordered she could take twice a day as needed for anxiety.  Pt verbalized understanding of information and expressed appreciation for call back.

## 2013-10-01 LAB — CULTURE, BLOOD (ROUTINE X 2)
CULTURE: NO GROWTH
Culture: NO GROWTH

## 2013-10-04 ENCOUNTER — Other Ambulatory Visit: Payer: Self-pay | Admitting: Oncology

## 2013-10-05 ENCOUNTER — Ambulatory Visit: Payer: Self-pay | Admitting: Family

## 2013-10-08 ENCOUNTER — Ambulatory Visit (HOSPITAL_BASED_OUTPATIENT_CLINIC_OR_DEPARTMENT_OTHER): Payer: No Typology Code available for payment source | Admitting: Nurse Practitioner

## 2013-10-08 ENCOUNTER — Telehealth: Payer: Self-pay | Admitting: Nurse Practitioner

## 2013-10-08 ENCOUNTER — Ambulatory Visit: Payer: No Typology Code available for payment source | Admitting: Nutrition

## 2013-10-08 ENCOUNTER — Other Ambulatory Visit (HOSPITAL_BASED_OUTPATIENT_CLINIC_OR_DEPARTMENT_OTHER): Payer: No Typology Code available for payment source

## 2013-10-08 ENCOUNTER — Telehealth: Payer: Self-pay | Admitting: *Deleted

## 2013-10-08 ENCOUNTER — Ambulatory Visit (HOSPITAL_BASED_OUTPATIENT_CLINIC_OR_DEPARTMENT_OTHER): Payer: No Typology Code available for payment source

## 2013-10-08 VITALS — BP 126/86 | HR 83 | Temp 98.2°F | Resp 18 | Wt 174.9 lb

## 2013-10-08 DIAGNOSIS — C182 Malignant neoplasm of ascending colon: Secondary | ICD-10-CM

## 2013-10-08 DIAGNOSIS — D509 Iron deficiency anemia, unspecified: Secondary | ICD-10-CM

## 2013-10-08 DIAGNOSIS — R21 Rash and other nonspecific skin eruption: Secondary | ICD-10-CM

## 2013-10-08 DIAGNOSIS — R918 Other nonspecific abnormal finding of lung field: Secondary | ICD-10-CM

## 2013-10-08 DIAGNOSIS — Z5111 Encounter for antineoplastic chemotherapy: Secondary | ICD-10-CM

## 2013-10-08 LAB — CBC WITH DIFFERENTIAL/PLATELET
BASO%: 0.2 % (ref 0.0–2.0)
Basophils Absolute: 0 10*3/uL (ref 0.0–0.1)
EOS%: 0.6 % (ref 0.0–7.0)
Eosinophils Absolute: 0 10*3/uL (ref 0.0–0.5)
HCT: 39.3 % (ref 34.8–46.6)
HGB: 12.2 g/dL (ref 11.6–15.9)
LYMPH#: 1 10*3/uL (ref 0.9–3.3)
LYMPH%: 21.3 % (ref 14.0–49.7)
MCH: 23.5 pg — ABNORMAL LOW (ref 25.1–34.0)
MCHC: 31 g/dL — ABNORMAL LOW (ref 31.5–36.0)
MCV: 75.6 fL — ABNORMAL LOW (ref 79.5–101.0)
MONO#: 0.5 10*3/uL (ref 0.1–0.9)
MONO%: 10.7 % (ref 0.0–14.0)
NEUT#: 3.2 10*3/uL (ref 1.5–6.5)
NEUT%: 67.2 % (ref 38.4–76.8)
Platelets: 175 10*3/uL (ref 145–400)
RBC: 5.2 10*6/uL (ref 3.70–5.45)
RDW: 21 % — ABNORMAL HIGH (ref 11.2–14.5)
WBC: 4.8 10*3/uL (ref 3.9–10.3)

## 2013-10-08 LAB — COMPREHENSIVE METABOLIC PANEL (CC13)
ALK PHOS: 90 U/L (ref 40–150)
ALT: 26 U/L (ref 0–55)
AST: 24 U/L (ref 5–34)
Albumin: 4.2 g/dL (ref 3.5–5.0)
Anion Gap: 8 mEq/L (ref 3–11)
BUN: 13.5 mg/dL (ref 7.0–26.0)
CALCIUM: 10 mg/dL (ref 8.4–10.4)
CHLORIDE: 107 meq/L (ref 98–109)
CO2: 26 mEq/L (ref 22–29)
CREATININE: 0.8 mg/dL (ref 0.6–1.1)
Glucose: 140 mg/dl (ref 70–140)
Potassium: 4.2 mEq/L (ref 3.5–5.1)
Sodium: 142 mEq/L (ref 136–145)
Total Bilirubin: 1.16 mg/dL (ref 0.20–1.20)
Total Protein: 7.5 g/dL (ref 6.4–8.3)

## 2013-10-08 MED ORDER — ONDANSETRON 8 MG/NS 50 ML IVPB
INTRAVENOUS | Status: AC
  Filled 2013-10-08: qty 8

## 2013-10-08 MED ORDER — LEUCOVORIN CALCIUM INJECTION 350 MG
400.0000 mg/m2 | Freq: Once | INTRAMUSCULAR | Status: AC
  Administered 2013-10-08: 772 mg via INTRAVENOUS
  Filled 2013-10-08: qty 38.6

## 2013-10-08 MED ORDER — NITROFURANTOIN MONOHYD MACRO 100 MG PO CAPS: 100.0000 mg | ORAL_CAPSULE | Freq: Two times a day (BID) | ORAL | Status: DC

## 2013-10-08 MED ORDER — DEXAMETHASONE SODIUM PHOSPHATE 10 MG/ML IJ SOLN
INTRAMUSCULAR | Status: AC
  Filled 2013-10-08: qty 1

## 2013-10-08 MED ORDER — DEXAMETHASONE SODIUM PHOSPHATE 10 MG/ML IJ SOLN
10.0000 mg | Freq: Once | INTRAMUSCULAR | Status: AC
  Administered 2013-10-08: 10 mg via INTRAVENOUS

## 2013-10-08 MED ORDER — SODIUM CHLORIDE 0.9 % IV SOLN
2400.0000 mg/m2 | INTRAVENOUS | Status: DC
  Administered 2013-10-08: 4650 mg via INTRAVENOUS
  Filled 2013-10-08: qty 93

## 2013-10-08 MED ORDER — DEXTROSE 5 % IV SOLN
85.0000 mg/m2 | Freq: Once | INTRAVENOUS | Status: AC
  Administered 2013-10-08: 165 mg via INTRAVENOUS
  Filled 2013-10-08: qty 33

## 2013-10-08 MED ORDER — ONDANSETRON 8 MG/50ML IVPB (CHCC)
8.0000 mg | Freq: Once | INTRAVENOUS | Status: AC
  Administered 2013-10-08: 8 mg via INTRAVENOUS

## 2013-10-08 MED ORDER — DEXTROSE 5 % IV SOLN
Freq: Once | INTRAVENOUS | Status: AC
  Administered 2013-10-08: 12:00:00 via INTRAVENOUS

## 2013-10-08 MED ORDER — FLUOROURACIL CHEMO INJECTION 2.5 GM/50ML
400.0000 mg/m2 | Freq: Once | INTRAVENOUS | Status: AC
  Administered 2013-10-08: 750 mg via INTRAVENOUS
  Filled 2013-10-08: qty 15

## 2013-10-08 NOTE — Patient Instructions (Signed)
[facility] Discharge Instructions for Patients Receiving Chemotherapy  Today you received the following chemotherapy agents FOLFOX.   To help prevent nausea and vomiting after your treatment, we encourage you to take your nausea medication as directed.    If you develop nausea and vomiting that is not controlled by your nausea medication, call the clinic.   BELOW ARE SYMPTOMS THAT SHOULD BE REPORTED IMMEDIATELY:  *FEVER GREATER THAN 100.5 F  *CHILLS WITH OR WITHOUT FEVER  NAUSEA AND VOMITING THAT IS NOT CONTROLLED WITH YOUR NAUSEA MEDICATION  *UNUSUAL SHORTNESS OF BREATH  *UNUSUAL BRUISING OR BLEEDING  TENDERNESS IN MOUTH AND THROAT WITH OR WITHOUT PRESENCE OF ULCERS  *URINARY PROBLEMS  *BOWEL PROBLEMS  UNUSUAL RASH Items with * indicate a potential emergency and should be followed up as soon as possible.  Feel free to call the clinic you have any questions or concerns. The clinic phone number is [phone].    

## 2013-10-08 NOTE — Progress Notes (Signed)
Met with patient to assess for needs.  She is doing weil today.  She had a fever after the last treatment that gave her a scare requiring her to come to ED.  She denies complaints today.  She has good support at home.  She plans to attend the next GI support group.

## 2013-10-08 NOTE — Progress Notes (Signed)
  [place] OFFICE PROGRESS NOTE   Diagnosis: Colon cancer.   INTERVAL HISTORY:   [name] returns as scheduled. She completed cycle 2 FOLFOX on 09/24/2013. She developed nausea day 1. The nausea lasted 4-5 days and was effectively relieved with Zofran. She denies mouth sores. No diarrhea. She had cold sensitivity for approximately 4 days in her hands. No persistent neuropathy symptoms. She has been having periodic headaches since the most recent chemotherapy. Headaches are now occurring about every 3 days.  She notes mild burning with urination. Just prior urination she note low abdominal pain.  She was admitted on 09/24/2013 with a fever and abdominal pain. Cultures were obtained. The fever resolved. There was no apparent source for an infection. Dr. Benay Spice suspected symptoms were related to chemotherapy. She was discharged home on 09/26/2013.  The urine culture returned positive for coag negative staph.  Objective:  Vital signs in last 24 hours:  Blood pressure 126/86, pulse 83, temperature 98.2 F (36.8 C), temperature source Oral, resp. rate 18, weight 174 lb 14.4 oz (79.334 kg), SpO2 100.00%.    HEENT: No thrush or ulcers. Resp: Lungs clear bilaterally. Cardio: Regular rate and rhythm. GI: Abdomen soft and nontender. No hepatomegaly. Vascular: No leg edema.  Skin: Multiple small acne-like lesions scattered over the back with a few similar lesions on the abdomen.    Lab Results:  Lab Results  Component Value Date   WBC 4.8 10/08/2013   HGB 12.2 10/08/2013   HCT 39.3 10/08/2013   MCV 75.6* 10/08/2013   PLT 175 10/08/2013   NEUTROABS 3.2 10/08/2013    Imaging:  No results found.  Medications: I have reviewed the patient's current medications.  Assessment/Plan: 1. 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 iron deficiency anemia  3. seizure disorder  4. history of depression  5. 4 mm right lower lobe nodule on a staging chest CT 09/08/2013 6. Hospitalization 09/24/2013 through 09/26/2013 with fever and abdominal pain. 7. 09/24/2013 urine culture positive for coag negative staph. 8. Mild acne-like skin rash. Question related to steroid premedication.    Disposition: [name] appears stable. She has completed 2 cycles of FOLFOX. Plan to proceed with cycle 3 today as scheduled.  The etiology of the fever following cycle 2 FOLFOX is unclear. Question if related to oxaliplatin.  A urine culture returned positive for coag negative staph with a low colony count. Question contaminant. She is having some urinary symptoms so we will go ahead and treat her with a course of Macrobid.  The mild acne-like skin rash may be related to the steroid premedication. She will call the office if this worsens.  She will return for a followup visit and cycle 4 FOLFOX in 2 weeks. She will contact the office in the interim as outlined above or with any other problems.  25 minutes were spent face-to-face at today's visit with the majority of the time involved in counseling/coordination of care.    Ned Card ANP/GNP-BC   10/08/2013  11:10 AM

## 2013-10-08 NOTE — Telephone Encounter (Signed)
Per staff message and POF I have scheduled appts. Advised scheduler of appts. JMW  

## 2013-10-08 NOTE — Progress Notes (Signed)
Nutrition followup completed in chemotherapy with patient been treated for colon cancer.  Patient states she thinks she has eaten more often since our discussion.  She still does not eat regular meals with snacks but verbalizes she understands it is important.  Patient has not tried [employer] yet.  Weight decreased and documented as 174.9 pounds September 3 from 178.7 pounds August 20.  Nutrition diagnosis: Unintended weight loss continues.  Intervention: Educated patient on increasing meals and snacks to 5-6 times daily. Recommended patient try [employer] breakfast essentials first thing in the morning. Questions answered.  Teach back method used.  Monitoring, evaluation, goals: Patient will tolerate increased calories and protein to promote weight maintenance.  Next visit: Thursday, September 17, during chemotherapy.  **Disclaimer: This note was dictated with voice recognition software. Similar sounding words can inadvertently be transcribed and this note may contain transcription errors which may not have been corrected upon publication of note.**

## 2013-10-08 NOTE — Telephone Encounter (Signed)
, °

## 2013-10-09 ENCOUNTER — Telehealth: Payer: Self-pay | Admitting: *Deleted

## 2013-10-09 NOTE — Telephone Encounter (Signed)
Call from pt reporting low abdominal pain is worsening. Constant since last night. Painful to apply pressure. Having urinary retention, also reports vaginal itching. Reviewed with Ned Card, NP: If unable to urinate pt will need to go to ED to be evaluated.  Returned call to pt, no answer. Left message requesting she call office.

## 2013-10-09 NOTE — Telephone Encounter (Signed)
Called pt at new number- she reports she is passing "a little urine." Instructed her to go to ED if unable to pass urine. Push fluids and continue antibiotic. She voiced understanding. Stated pain is better, took Benadryl for vaginal itching.

## 2013-10-10 ENCOUNTER — Ambulatory Visit (HOSPITAL_BASED_OUTPATIENT_CLINIC_OR_DEPARTMENT_OTHER): Payer: No Typology Code available for payment source

## 2013-10-10 VITALS — BP 124/75 | HR 105 | Temp 98.8°F

## 2013-10-10 DIAGNOSIS — C182 Malignant neoplasm of ascending colon: Secondary | ICD-10-CM

## 2013-10-10 DIAGNOSIS — Z452 Encounter for adjustment and management of vascular access device: Secondary | ICD-10-CM

## 2013-10-10 MED ORDER — HEPARIN SOD (PORK) LOCK FLUSH 100 UNIT/ML IV SOLN
500.0000 [IU] | Freq: Once | INTRAVENOUS | Status: AC | PRN
  Administered 2013-10-10: 500 [IU]
  Filled 2013-10-10: qty 5

## 2013-10-10 MED ORDER — SODIUM CHLORIDE 0.9 % IJ SOLN
10.0000 mL | INTRAMUSCULAR | Status: DC | PRN
  Administered 2013-10-10: 10 mL
  Filled 2013-10-10: qty 10

## 2013-10-16 ENCOUNTER — Telehealth: Payer: Self-pay | Admitting: *Deleted

## 2013-10-16 MED ORDER — FLUCONAZOLE 150 MG PO TABS: 150.0000 mg | ORAL_TABLET | Freq: Every day | ORAL | Status: DC

## 2013-10-16 NOTE — Telephone Encounter (Signed)
Message from pt reporting dysuria and vaginal itching. Reports redness in skin folds. Reviewed with Ned Card, NP: Order received for Diflucan 150 mg x1. Pt will need to hold Prilosec x 2 days due to possible interaction. Called pt with instructions, she agrees to [employer] Rx sent electronically.

## 2013-10-18 ENCOUNTER — Other Ambulatory Visit: Payer: Self-pay | Admitting: Oncology

## 2013-10-19 ENCOUNTER — Telehealth: Payer: Self-pay | Admitting: Family

## 2013-10-19 NOTE — Telephone Encounter (Signed)
Error/gd °

## 2013-10-22 ENCOUNTER — Telehealth: Payer: Self-pay | Admitting: Oncology

## 2013-10-22 ENCOUNTER — Ambulatory Visit: Payer: No Typology Code available for payment source | Admitting: Nutrition

## 2013-10-22 ENCOUNTER — Telehealth: Payer: Self-pay | Admitting: *Deleted

## 2013-10-22 ENCOUNTER — Other Ambulatory Visit: Payer: Self-pay | Admitting: *Deleted

## 2013-10-22 ENCOUNTER — Ambulatory Visit (HOSPITAL_BASED_OUTPATIENT_CLINIC_OR_DEPARTMENT_OTHER): Payer: No Typology Code available for payment source

## 2013-10-22 ENCOUNTER — Other Ambulatory Visit (HOSPITAL_BASED_OUTPATIENT_CLINIC_OR_DEPARTMENT_OTHER): Payer: No Typology Code available for payment source

## 2013-10-22 ENCOUNTER — Ambulatory Visit (HOSPITAL_BASED_OUTPATIENT_CLINIC_OR_DEPARTMENT_OTHER): Payer: No Typology Code available for payment source | Admitting: Oncology

## 2013-10-22 VITALS — BP 122/74 | HR 67 | Temp 98.4°F | Resp 18 | Ht 66.0 in | Wt 172.0 lb

## 2013-10-22 DIAGNOSIS — Z5111 Encounter for antineoplastic chemotherapy: Secondary | ICD-10-CM

## 2013-10-22 DIAGNOSIS — C182 Malignant neoplasm of ascending colon: Secondary | ICD-10-CM

## 2013-10-22 DIAGNOSIS — Z452 Encounter for adjustment and management of vascular access device: Secondary | ICD-10-CM

## 2013-10-22 DIAGNOSIS — R229 Localized swelling, mass and lump, unspecified: Secondary | ICD-10-CM

## 2013-10-22 LAB — COMPREHENSIVE METABOLIC PANEL (CC13)
ALT: 24 U/L (ref 0–55)
AST: 26 U/L (ref 5–34)
Albumin: 4 g/dL (ref 3.5–5.0)
Alkaline Phosphatase: 95 U/L (ref 40–150)
Anion Gap: 9 mEq/L (ref 3–11)
BUN: 11.1 mg/dL (ref 7.0–26.0)
CO2: 23 meq/L (ref 22–29)
Calcium: 9.3 mg/dL (ref 8.4–10.4)
Chloride: 107 mEq/L (ref 98–109)
Creatinine: 0.8 mg/dL (ref 0.6–1.1)
GLUCOSE: 114 mg/dL (ref 70–140)
POTASSIUM: 3.6 meq/L (ref 3.5–5.1)
Sodium: 140 mEq/L (ref 136–145)
Total Bilirubin: 1.26 mg/dL — ABNORMAL HIGH (ref 0.20–1.20)
Total Protein: 7.1 g/dL (ref 6.4–8.3)

## 2013-10-22 LAB — CBC WITH DIFFERENTIAL/PLATELET
BASO%: 0.5 % (ref 0.0–2.0)
BASOS ABS: 0 10*3/uL (ref 0.0–0.1)
EOS ABS: 0 10*3/uL (ref 0.0–0.5)
EOS%: 0.7 % (ref 0.0–7.0)
HEMATOCRIT: 37.4 % (ref 34.8–46.6)
HEMOGLOBIN: 11.8 g/dL (ref 11.6–15.9)
LYMPH%: 27.5 % (ref 14.0–49.7)
MCH: 24.3 pg — ABNORMAL LOW (ref 25.1–34.0)
MCHC: 31.6 g/dL (ref 31.5–36.0)
MCV: 77 fL — ABNORMAL LOW (ref 79.5–101.0)
MONO#: 0.5 10*3/uL (ref 0.1–0.9)
MONO%: 12 % (ref 0.0–14.0)
NEUT#: 2.5 10*3/uL (ref 1.5–6.5)
NEUT%: 59.3 % (ref 38.4–76.8)
PLATELETS: 111 10*3/uL — AB (ref 145–400)
RBC: 4.86 10*6/uL (ref 3.70–5.45)
RDW: 20.8 % — ABNORMAL HIGH (ref 11.2–14.5)
WBC: 4.3 10*3/uL (ref 3.9–10.3)
lymph#: 1.2 10*3/uL (ref 0.9–3.3)
nRBC: 0 % (ref 0–0)

## 2013-10-22 MED ORDER — LEUCOVORIN CALCIUM INJECTION 350 MG
400.0000 mg/m2 | Freq: Once | INTRAVENOUS | Status: AC
  Administered 2013-10-22: 772 mg via INTRAVENOUS
  Filled 2013-10-22: qty 38.6

## 2013-10-22 MED ORDER — ALTEPLASE 2 MG IJ SOLR
2.0000 mg | Freq: Once | INTRAMUSCULAR | Status: AC | PRN
  Administered 2013-10-22: 2 mg
  Filled 2013-10-22: qty 2

## 2013-10-22 MED ORDER — LORAZEPAM 1 MG PO TABS: 1.0000 mg | ORAL_TABLET | Freq: Two times a day (BID) | ORAL | Status: DC | PRN

## 2013-10-22 MED ORDER — DEXTROSE 5 % IV SOLN
Freq: Once | INTRAVENOUS | Status: AC
  Administered 2013-10-22: 11:00:00 via INTRAVENOUS

## 2013-10-22 MED ORDER — ONDANSETRON 8 MG/50ML IVPB (CHCC)
8.0000 mg | Freq: Once | INTRAVENOUS | Status: AC
  Administered 2013-10-22: 8 mg via INTRAVENOUS

## 2013-10-22 MED ORDER — FLUOROURACIL CHEMO INJECTION 2.5 GM/50ML
400.0000 mg/m2 | Freq: Once | INTRAVENOUS | Status: AC
  Administered 2013-10-22: 750 mg via INTRAVENOUS
  Filled 2013-10-22: qty 15

## 2013-10-22 MED ORDER — DEXAMETHASONE SODIUM PHOSPHATE 10 MG/ML IJ SOLN
INTRAMUSCULAR | Status: AC
  Filled 2013-10-22: qty 1

## 2013-10-22 MED ORDER — OXALIPLATIN CHEMO INJECTION 100 MG/20ML
85.0000 mg/m2 | Freq: Once | INTRAVENOUS | Status: AC
  Administered 2013-10-22: 165 mg via INTRAVENOUS
  Filled 2013-10-22: qty 33

## 2013-10-22 MED ORDER — DEXAMETHASONE SODIUM PHOSPHATE 10 MG/ML IJ SOLN
10.0000 mg | Freq: Once | INTRAMUSCULAR | Status: AC
  Administered 2013-10-22: 10 mg via INTRAVENOUS

## 2013-10-22 MED ORDER — ONDANSETRON 8 MG/NS 50 ML IVPB
INTRAVENOUS | Status: AC
  Filled 2013-10-22: qty 8

## 2013-10-22 MED ORDER — SODIUM CHLORIDE 0.9 % IV SOLN
2400.0000 mg/m2 | INTRAVENOUS | Status: DC
  Administered 2013-10-22: 4650 mg via INTRAVENOUS
  Filled 2013-10-22: qty 93

## 2013-10-22 NOTE — Telephone Encounter (Signed)
Per staff message and POF I have scheduled appts. Advised scheduler of appts. JMW  

## 2013-10-22 NOTE — Patient Instructions (Signed)
[facility] Discharge Instructions for Patients Receiving Chemotherapy  Today you received the following chemotherapy agents:  Oxaliplatin, Leucovorin and 5FU  To help prevent nausea and vomiting after your treatment, we encourage you to take your nausea medication as ordered per MD.   If you develop nausea and vomiting that is not controlled by your nausea medication, call the clinic.   BELOW ARE SYMPTOMS THAT SHOULD BE REPORTED IMMEDIATELY:  *FEVER GREATER THAN 100.5 F  *CHILLS WITH OR WITHOUT FEVER  NAUSEA AND VOMITING THAT IS NOT CONTROLLED WITH YOUR NAUSEA MEDICATION  *UNUSUAL SHORTNESS OF BREATH  *UNUSUAL BRUISING OR BLEEDING  TENDERNESS IN MOUTH AND THROAT WITH OR WITHOUT PRESENCE OF ULCERS  *URINARY PROBLEMS  *BOWEL PROBLEMS  UNUSUAL RASH Items with * indicate a potential emergency and should be followed up as soon as possible.  Feel free to call the clinic you have any questions or concerns. The clinic phone number is [phone].    

## 2013-10-22 NOTE — Telephone Encounter (Signed)
gv and pritned appt sched and avs for pt for SEpt and OCT...sed added tx.

## 2013-10-22 NOTE — Progress Notes (Signed)
  [place] OFFICE PROGRESS NOTE   Diagnosis: Colon cancer  INTERVAL HISTORY:   She returns as scheduled. She completed another cycle of FOLFOX on 10/08/2013. Mild nausea following chemotherapy. Cold sensitivity lasted for 6 days. No diarrhea. No neuropathy symptoms at present. She has noted a "knot "at the dorsum of the right hand for the past few weeks.  Objective:  Vital signs in last 24 hours:  There were no vitals taken for this visit.    HEENT: No thrush or ulcers, geographic tongue Resp: Lungs clear bilaterally Cardio: Regular rate and rhythm GI: No hepatomegaly, nontender Vascular: No leg edema, there is a palpable core corresponding to a vein at the dorsum of the right hand and ear and IV puncture site. No erythema or tenderness Neuro: The vibratory sense is intact at the fingertips bilaterally     Portacath/PICC-mild erythema overlying the port-she relates this to placing EMLA cream this morning  Lab Results:  Lab Results  Component Value Date   WBC 4.8 10/08/2013   HGB 12.2 10/08/2013   HCT 39.3 10/08/2013   MCV 75.6* 10/08/2013   PLT 175 10/08/2013   NEUTROABS 3.2 10/08/2013     Imaging:  No results found.  Medications: I have reviewed the patient's current medications.  Assessment/Plan: 1. 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 iron deficiency anemia  3. seizure disorder  4. history of depression  5. 4 mm right lower lobe nodule on a staging chest CT 09/08/2013  6. Hospitalization 09/24/2013 through 09/26/2013 with fever and abdominal pain.  7. 09/24/2013 urine culture positive for coag negative staph.    Disposition:  She has completed 3  cycles of FOLFOX. She appears to be tolerating the chemotherapy well. The plan is to proceed with cycle 4 today. She will return for an office visit and chemotherapy in 2 weeks. She has a superficial palpable cord at the dorsum of the right hand. She will contact us if this area becomes painful or erythematous.  Betsy Coder, MD  10/22/2013  8:24 AM

## 2013-10-22 NOTE — Telephone Encounter (Signed)
per pof to sch pt appt-sent MW emailt to sch trmt-pt in trmt room will get updated copy of sch

## 2013-10-22 NOTE — Progress Notes (Signed)
1020-TPA inserted to port.  1050-No blood return from port.  Will re-check in 30 minutes.  1120-Positive blood return from port.

## 2013-10-22 NOTE — Progress Notes (Signed)
Nutrition followup completed in chemotherapy with patient, who is being treated for colon cancer.  Patient reports her appetite has improved and her oral intake is increased.  She states she now is having a formed stool after oral intake.  She still has not tried [employer].  Weight decreased and documented as 172 pounds on September 17 from 174.9 pounds September 3.  Nutrition diagnosis: Unintended weight loss continues.  Intervention: Educated patient on increasing meals and snacks throughout the day. Questions answered.  Teach back method used.  Monitoring, evaluation, goals: Patient has had continued weight loss, but oral intake is improving.  Next visit: Thursday, October 15, during chemotherapy.   **Disclaimer: This note was dictated with voice recognition software. Similar sounding words can inadvertently be transcribed and this note may contain transcription errors which may not have been corrected upon publication of note.**

## 2013-10-24 ENCOUNTER — Ambulatory Visit (HOSPITAL_BASED_OUTPATIENT_CLINIC_OR_DEPARTMENT_OTHER): Payer: No Typology Code available for payment source

## 2013-10-24 DIAGNOSIS — C182 Malignant neoplasm of ascending colon: Secondary | ICD-10-CM

## 2013-10-24 MED ORDER — HEPARIN SOD (PORK) LOCK FLUSH 100 UNIT/ML IV SOLN
500.0000 [IU] | Freq: Once | INTRAVENOUS | Status: AC | PRN
  Administered 2013-10-24: 500 [IU]
  Filled 2013-10-24: qty 5

## 2013-10-24 MED ORDER — SODIUM CHLORIDE 0.9 % IJ SOLN
10.0000 mL | INTRAMUSCULAR | Status: DC | PRN
  Administered 2013-10-24: 10 mL
  Filled 2013-10-24: qty 10

## 2013-10-24 NOTE — Progress Notes (Signed)
Pt in for pump d/c.  7.0 ml's remain.  Rn primed 7.0 ml's and then d/c'd pump per chemo orders.  Pt stable at discharge

## 2013-10-24 NOTE — Patient Instructions (Signed)
[facility] Discharge Instructions for Patients Receiving Chemotherapy  Today you received the following chemotherapy agents 5FU To help prevent nausea and vomiting after your treatment, we encourage you to take your nausea medication as directed/prescribed   If you develop nausea and vomiting that is not controlled by your nausea medication, call the clinic.   BELOW ARE SYMPTOMS THAT SHOULD BE REPORTED IMMEDIATELY:  *FEVER GREATER THAN 100.5 F  *CHILLS WITH OR WITHOUT FEVER  NAUSEA AND VOMITING THAT IS NOT CONTROLLED WITH YOUR NAUSEA MEDICATION  *UNUSUAL SHORTNESS OF BREATH  *UNUSUAL BRUISING OR BLEEDING  TENDERNESS IN MOUTH AND THROAT WITH OR WITHOUT PRESENCE OF ULCERS  *URINARY PROBLEMS  *BOWEL PROBLEMS  UNUSUAL RASH Items with * indicate a potential emergency and should be followed up as soon as possible.  Feel free to call the clinic you have any questions or concerns. The clinic phone number is [phone].    

## 2013-10-27 ENCOUNTER — Encounter (HOSPITAL_COMMUNITY): Payer: Self-pay | Admitting: Emergency Medicine

## 2013-10-27 ENCOUNTER — Emergency Department (HOSPITAL_COMMUNITY)
Admission: EM | Admit: 2013-10-27 | Discharge: 2013-10-28 | Discharge status: Against Medical Advice | Payer: No Typology Code available for payment source | Attending: Emergency Medicine | Admitting: Emergency Medicine

## 2013-10-27 DIAGNOSIS — T82598A Other mechanical complication of other cardiac and vascular devices and implants, initial encounter: Secondary | ICD-10-CM | POA: Diagnosis not present

## 2013-10-27 DIAGNOSIS — Y849 Medical procedure, unspecified as the cause of abnormal reaction of the patient, or of later complication, without mention of misadventure at the time of the procedure: Secondary | ICD-10-CM | POA: Insufficient documentation

## 2013-10-27 NOTE — ED Notes (Signed)
Pt states that she had a port-a-cath placed on 8/5; pt states that she had a chemo infusion Thurs-Sat and that it seemed fine; pt c/o pain and tenderness to port-a-cath that began around 6pm; pt state that it is tender to palpation and she has constant pain to the left of the site; no obvious redness or drainage noted to site; afebrile

## 2013-10-28 ENCOUNTER — Ambulatory Visit (HOSPITAL_BASED_OUTPATIENT_CLINIC_OR_DEPARTMENT_OTHER): Payer: No Typology Code available for payment source | Admitting: Nurse Practitioner

## 2013-10-28 ENCOUNTER — Telehealth: Payer: Self-pay | Admitting: *Deleted

## 2013-10-28 ENCOUNTER — Encounter: Payer: Self-pay | Admitting: Nurse Practitioner

## 2013-10-28 ENCOUNTER — Telehealth: Payer: Self-pay | Admitting: Nurse Practitioner

## 2013-10-28 VITALS — BP 117/71 | HR 85 | Temp 98.4°F | Resp 20 | Ht 66.0 in | Wt 167.4 lb

## 2013-10-28 DIAGNOSIS — C182 Malignant neoplasm of ascending colon: Secondary | ICD-10-CM

## 2013-10-28 DIAGNOSIS — R634 Abnormal weight loss: Secondary | ICD-10-CM | POA: Insufficient documentation

## 2013-10-28 DIAGNOSIS — T85698A Other mechanical complication of other specified internal prosthetic devices, implants and grafts, initial encounter: Secondary | ICD-10-CM

## 2013-10-28 DIAGNOSIS — K1231 Oral mucositis (ulcerative) due to antineoplastic therapy: Secondary | ICD-10-CM | POA: Insufficient documentation

## 2013-10-28 DIAGNOSIS — R197 Diarrhea, unspecified: Secondary | ICD-10-CM

## 2013-10-28 DIAGNOSIS — T85618A Breakdown (mechanical) of other specified internal prosthetic devices, implants and grafts, initial encounter: Secondary | ICD-10-CM

## 2013-10-28 MED ORDER — MAGIC MOUTHWASH W/LIDOCAINE: 5.0000 mL | Freq: Four times a day (QID) | ORAL | Status: DC | PRN

## 2013-10-28 NOTE — Telephone Encounter (Signed)
Per Dr. Benay Spice; left voice message that pt is scheduled to see Selena Lesser, NP today at 1pm for evaluation.  Call office if any questions.

## 2013-10-28 NOTE — Assessment & Plan Note (Signed)
Patient continues to lose weight.  She states she is postpartum 14 pounds since her diagnosis.  The patient has 30 minute with a nutritionist twice recently.  She states she is trying to eat multiple small meals throughout the day and to push protein.  Once again advised the same.  Most likely, decreased appetite is a chemotherapy side effect.

## 2013-10-28 NOTE — Assessment & Plan Note (Signed)
Patient does have one ulcerated lesion to the right side of her tongue.  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  Was prescribed Magic mouthwash with lidocaine.  Advised patient to avoid spicy foods that may bother her tongue.  Recommended patient continue to use baking soda/salt swish and spit as well.

## 2013-10-28 NOTE — Telephone Encounter (Signed)
Pt confirmed labs/ov per 09/23 POF, Cathflo for port on 10/01, gave pt AVS....KJ

## 2013-10-28 NOTE — Assessment & Plan Note (Signed)
Patient received cycle 4 of her FOLFOX chemotherapy regimen on 10/22/2013.  Patient will be due for cycle 5 of the same regimen on 11/05/2013.

## 2013-10-28 NOTE — Telephone Encounter (Signed)
Pt called requesting to be seen today "My port hurts and it just doesn't feel right"  Pt states she went to ED last night and left d/t long wait period.  Pt also reports that she is having diarrhea X2 this am and has "sore of the side of my tongue"  Denies fever/N/V; eating & drinking decreased;  "would like to be seen in office today"  Last chemo 10/22/13 with pump d/c 10/24/13.  Note to Dr. Benay Spice.

## 2013-10-28 NOTE — ED Notes (Signed)
PT advised Registration that she is leaving and does not want to stay any longer; RN went to talk to pt and pt not in waiting room or area immediately outside; pt eloped from waiting room

## 2013-10-28 NOTE — Assessment & Plan Note (Signed)
Patient had 2 episodes of diarrhea this morning.  She states that she did not take any over-the-counter medications for the diarrhea.  Most likely this is a chemotherapy side effect.  Advised patient to try Imodium over-the-counter as directed.  Advised her to let us know if this does not help resolve the diarrhea issues.
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
## 2013-10-28 NOTE — Assessment & Plan Note (Signed)
Left chest Port-A-Cath appears intact with no evidence of trauma.  Nontender with palpation.  Patient does report that in the past she has had Cathflo due to inability to draw back on port.  Will add some additional time prior to patient's next chemotherapy appointment on 11/05/2013 of due to probable need for Cathflo again.

## 2013-10-30 ENCOUNTER — Telehealth: Payer: Self-pay | Admitting: *Deleted

## 2013-10-30 ENCOUNTER — Encounter: Payer: Self-pay | Admitting: *Deleted

## 2013-10-30 NOTE — Progress Notes (Signed)
[name]  Clinical Social [name] was referred by nurse due to emotional concerns for assessment of psychosocial needs.  Clinical Social Worker phoned patient to offer support and assess for needs. Pt has had some mixed emotions due to complex family dynamics. She has an appointment on Tuesday with Dr. Cheryln Manly for counseling. Pt denies thoughts of suicide and depression currently. She has a safe place for the weekend and support from family and friends. She is open to continued follow up from the [facility] team and agrees to seek crisis support over the weekend as indicated.   Clinical Social [name] interventions: Crisis intervention Emotional support  [name], [name] Social Worker [name]  [name] Phone: [phone] Fax: [phone]

## 2013-10-30 NOTE — Telephone Encounter (Signed)
Patient had called today several times and left messages for RN to call her to discuss possible "side effects from chemo". She has been very emotional and tearful and asking if the chemo causes this? She reports her husband thinks it is due to the chemo. Explained to her that chemo does not cause this, but all the emotional stress she is under dealing with the diagnosis and treatment and finances that goes along with it can cause anxiety and depression. She also adds there is tension in her marital relationship and she and her husband are not communicating well. The ativan twice daily helps her anxiety some and she sees Dr. Cheryln Manly next week (has seen him in the past). She is currently not on an antidepressant. Says she stopped this "a long time ago". She has spoken with social worker, Vernie Shanks this week about her anxiety. Suggested she get her husband to go to caregiver support group and she thinks he may do it. She is also requesting someone talk with her husband about her anxiety and that it is no due to chemo. Made her aware that I will alert social worker to make him aware of support groups and that she is available to talk with him also if necessary. Encouraged her to keep her psych appointment next week and that she may require antidepressant again to help her get though this current stress along with help with her coping skills. She denies that she is suicidal and agrees to go to ER or call for help and not harm herself. She expresses that her 63 year old daughter brings her joy and the 69yo son "keeps his distance". Encouraged her to focus on things she enjoys. Called social worker, Abby Potash to contact husband and patient. Spent approximately 25 minutes on phone with patient.

## 2013-11-01 ENCOUNTER — Other Ambulatory Visit: Payer: Self-pay | Admitting: Oncology

## 2013-11-02 ENCOUNTER — Telehealth: Payer: Self-pay | Admitting: *Deleted

## 2013-11-02 NOTE — Telephone Encounter (Signed)
Returned call to pt's husband he has several concerns about pt's mood swings and memory loss. He wonders if this is related to "chemo brain". Recommended he accompany pt to psychiatrist appt to discuss her mood and behavior changes. He agreed to do so, stated he took FMLA from work so he can be there more to support her.

## 2013-11-02 NOTE — Telephone Encounter (Signed)
Message from pt's husband requesting to speak with Dr. Benay Spice to discuss pt. Request to MD. 1020: Call from pt asking if it is OK to color her hair. Yes, may have loss or thinning due to chemo treatment but hair color will not cause an issue with your chemo. She voiced understanding. Stated she is feeling better today emotionally. "Life is still a roller coaster." Pt stated "my husband doesn't understand, he thinks everything is because of chemo." She is scheduled to see her psychiatrist 9/29. Denied any suicidal ideation.

## 2013-11-03 ENCOUNTER — Ambulatory Visit (INDEPENDENT_AMBULATORY_CARE_PROVIDER_SITE_OTHER): Payer: No Typology Code available for payment source | Admitting: Psychology

## 2013-11-03 DIAGNOSIS — F331 Major depressive disorder, recurrent, moderate: Secondary | ICD-10-CM

## 2013-11-05 ENCOUNTER — Ambulatory Visit (HOSPITAL_BASED_OUTPATIENT_CLINIC_OR_DEPARTMENT_OTHER): Payer: No Typology Code available for payment source | Admitting: Nurse Practitioner

## 2013-11-05 ENCOUNTER — Telehealth: Payer: Self-pay | Admitting: *Deleted

## 2013-11-05 ENCOUNTER — Ambulatory Visit (HOSPITAL_BASED_OUTPATIENT_CLINIC_OR_DEPARTMENT_OTHER): Payer: No Typology Code available for payment source

## 2013-11-05 ENCOUNTER — Telehealth: Payer: Self-pay | Admitting: Nurse Practitioner

## 2013-11-05 ENCOUNTER — Ambulatory Visit: Payer: No Typology Code available for payment source

## 2013-11-05 ENCOUNTER — Other Ambulatory Visit (HOSPITAL_BASED_OUTPATIENT_CLINIC_OR_DEPARTMENT_OTHER): Payer: No Typology Code available for payment source

## 2013-11-05 VITALS — BP 129/90 | HR 72 | Temp 98.4°F | Resp 19 | Ht 66.0 in | Wt 171.2 lb

## 2013-11-05 DIAGNOSIS — D6959 Other secondary thrombocytopenia: Secondary | ICD-10-CM

## 2013-11-05 DIAGNOSIS — Z95828 Presence of other vascular implants and grafts: Secondary | ICD-10-CM

## 2013-11-05 DIAGNOSIS — C182 Malignant neoplasm of ascending colon: Secondary | ICD-10-CM

## 2013-11-05 DIAGNOSIS — D509 Iron deficiency anemia, unspecified: Secondary | ICD-10-CM

## 2013-11-05 DIAGNOSIS — R911 Solitary pulmonary nodule: Secondary | ICD-10-CM

## 2013-11-05 DIAGNOSIS — Z5111 Encounter for antineoplastic chemotherapy: Secondary | ICD-10-CM

## 2013-11-05 LAB — CBC WITH DIFFERENTIAL/PLATELET
BASO%: 0.3 % (ref 0.0–2.0)
Basophils Absolute: 0 10*3/uL (ref 0.0–0.1)
EOS%: 0.6 % (ref 0.0–7.0)
Eosinophils Absolute: 0 10*3/uL (ref 0.0–0.5)
HCT: 36.9 % (ref 34.8–46.6)
HGB: 11.8 g/dL (ref 11.6–15.9)
LYMPH%: 28.4 % (ref 14.0–49.7)
MCH: 25.2 pg (ref 25.1–34.0)
MCHC: 32 g/dL (ref 31.5–36.0)
MCV: 78.7 fL — ABNORMAL LOW (ref 79.5–101.0)
MONO#: 0.4 10*3/uL (ref 0.1–0.9)
MONO%: 12.5 % (ref 0.0–14.0)
NEUT#: 1.9 10*3/uL (ref 1.5–6.5)
NEUT%: 58.2 % (ref 38.4–76.8)
Platelets: 66 10*3/uL — ABNORMAL LOW (ref 145–400)
RBC: 4.69 10*6/uL (ref 3.70–5.45)
RDW: 19.9 % — ABNORMAL HIGH (ref 11.2–14.5)
WBC: 3.2 10*3/uL — ABNORMAL LOW (ref 3.9–10.3)
lymph#: 0.9 10*3/uL (ref 0.9–3.3)
nRBC: 0 % (ref 0–0)

## 2013-11-05 LAB — COMPREHENSIVE METABOLIC PANEL (CC13)
ALT: 33 U/L (ref 0–55)
AST: 30 U/L (ref 5–34)
Albumin: 3.6 g/dL (ref 3.5–5.0)
Alkaline Phosphatase: 96 U/L (ref 40–150)
Anion Gap: 8 mEq/L (ref 3–11)
BUN: 14.2 mg/dL (ref 7.0–26.0)
CO2: 25 mEq/L (ref 22–29)
Calcium: 9.4 mg/dL (ref 8.4–10.4)
Chloride: 108 mEq/L (ref 98–109)
Creatinine: 0.7 mg/dL (ref 0.6–1.1)
Glucose: 124 mg/dl (ref 70–140)
Potassium: 3.6 mEq/L (ref 3.5–5.1)
Sodium: 141 mEq/L (ref 136–145)
Total Bilirubin: 1.26 mg/dL — ABNORMAL HIGH (ref 0.20–1.20)
Total Protein: 6.7 g/dL (ref 6.4–8.3)

## 2013-11-05 MED ORDER — FLUOROURACIL CHEMO INJECTION 2.5 GM/50ML
400.0000 mg/m2 | Freq: Once | INTRAVENOUS | Status: AC
  Administered 2013-11-05: 750 mg via INTRAVENOUS
  Filled 2013-11-05: qty 15

## 2013-11-05 MED ORDER — SODIUM CHLORIDE 0.9 % IJ SOLN
10.0000 mL | INTRAMUSCULAR | Status: DC | PRN
  Administered 2013-11-05: 10 mL via INTRAVENOUS
  Filled 2013-11-05: qty 10

## 2013-11-05 MED ORDER — LEUCOVORIN CALCIUM INJECTION 350 MG
400.0000 mg/m2 | Freq: Once | INTRAVENOUS | Status: AC
  Administered 2013-11-05: 772 mg via INTRAVENOUS
  Filled 2013-11-05: qty 38.6

## 2013-11-05 MED ORDER — DEXTROSE 5 % IV SOLN
Freq: Once | INTRAVENOUS | Status: AC
  Administered 2013-11-05: 13:00:00 via INTRAVENOUS

## 2013-11-05 MED ORDER — FLUOROURACIL CHEMO INJECTION 5 GM/100ML
2400.0000 mg/m2 | INTRAVENOUS | Status: DC
  Administered 2013-11-05: 4650 mg via INTRAVENOUS
  Filled 2013-11-05: qty 93

## 2013-11-05 NOTE — Patient Instructions (Signed)
[facility] Discharge Instructions for Patients Receiving Chemotherapy  Today you received the following chemotherapy agents 5 FU and Leucovorin  To help prevent nausea and vomiting after your treatment, we encourage you to take your nausea medication as directed/prescribed. If you develop nausea and vomiting that is not controlled by your nausea medication, call the clinic.   BELOW ARE SYMPTOMS THAT SHOULD BE REPORTED IMMEDIATELY:  *FEVER GREATER THAN 100.5 F  *CHILLS WITH OR WITHOUT FEVER  NAUSEA AND VOMITING THAT IS NOT CONTROLLED WITH YOUR NAUSEA MEDICATION  *UNUSUAL SHORTNESS OF BREATH  *UNUSUAL BRUISING OR BLEEDING  TENDERNESS IN MOUTH AND THROAT WITH OR WITHOUT PRESENCE OF ULCERS  *URINARY PROBLEMS  *BOWEL PROBLEMS  UNUSUAL RASH Items with * indicate a potential emergency and should be followed up as soon as possible.  Feel free to call the clinic you have any questions or concerns. The clinic phone number is [phone].

## 2013-11-05 NOTE — Telephone Encounter (Signed)
Per staff message and POF I have scheduled appts. Advised scheduler of appts. JMW  

## 2013-11-05 NOTE — Progress Notes (Signed)
  [facility] OFFICE PROGRESS NOTE   Diagnosis:  Colon cancer  INTERVAL HISTORY:   [name] returns as scheduled. She completed cycle 4 FOLFOX on 10/22/2013. She denies nausea/vomiting. She had a single mouth sore that has resolved. She had 2 episodes of loose stools. She took Imodium with good results. Cold sensitivity lasted 4 days. She denies persistent neuropathy symptoms. She denies pain. Her appetite is improving. She recently had an episode of vaginal bleeding. When she urinates she has recently noted an "orgasm" like sensation.  Objective:  Vital signs in last 24 hours:  Blood pressure 129/90, pulse 72, temperature 98.4 F (36.9 C), temperature source Oral, resp. rate 19, height 5' 6" (1.676 m), weight 171 lb 3.2 oz (77.656 kg).    HEENT: No thrush or ulcers. Resp: Lungs clear bilaterally. Cardio: Regular rate and rhythm. GI: Abdomen soft and nontender. No hepatomegaly. Vascular: No leg edema. Calves soft and nontender. Neuro: Vibratory sense intact over the fingertips per tuning fork exam.  Port-A-Cath without erythema.   Lab Results:  Lab Results  Component Value Date   WBC 3.2* 11/05/2013   HGB 11.8 11/05/2013   HCT 36.9 11/05/2013   MCV 78.7* 11/05/2013   PLT 66* 11/05/2013   NEUTROABS 1.9 11/05/2013    Imaging:  No results found.  Medications: I have reviewed the patient's current medications.  Assessment/Plan: 1. 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  The tumor returned microsatellite stable with no loss of mismatch repair protein expression   Cycle 1 adjuvant FOLFOX 09/08/2013  Cycle 2 adjuvant FOLFOX 09/24/2013  Cycle 3 adjuvant FOLFOX 10/08/2013.  Cycle 4 adjuvant FOLFOX 10/22/2013. 2. iron deficiency anemia  3. seizure disorder  4. history of depression  5. 4 mm right lower lobe nodule on a staging chest CT 09/08/2013   6. Hospitalization 09/24/2013 through 09/26/2013 with fever and abdominal pain.  7. 09/24/2013 urine culture positive for coag negative staph.  8. Thrombocytopenia secondary to chemotherapy 11/05/2013.    Disposition: [name] has completed 4 cycles of FOLFOX. She has progressive thrombocytopenia. Plan to proceed with cycle 5 today as scheduled with the exception of oxaliplatin which will be held due to thrombocytopenia.   She will return for a followup visit and cycle 6 FOLFOX in 2 weeks. The oxaliplatin will be dose reduced when resumed with cycle 6. She understands to contact the office prior to her next visit with any bleeding.  She will contact her gynecologist regarding the episode of vaginal bleeding and symptoms she is experiencing with urination.  Plan reviewed with Dr. Letta Kocher, Clydia Nieves ANP/GNP-BC   11/05/2013  11:04 AM

## 2013-11-05 NOTE — Progress Notes (Signed)
Okay to treat today despite Platelets at 66; hold Oxaliplatin, per Ned Card, NP.

## 2013-11-05 NOTE — Telephone Encounter (Signed)
, °

## 2013-11-07 ENCOUNTER — Ambulatory Visit (HOSPITAL_BASED_OUTPATIENT_CLINIC_OR_DEPARTMENT_OTHER): Payer: No Typology Code available for payment source

## 2013-11-07 VITALS — BP 130/83 | HR 82 | Temp 98.2°F | Resp 16

## 2013-11-07 DIAGNOSIS — C182 Malignant neoplasm of ascending colon: Secondary | ICD-10-CM

## 2013-11-07 DIAGNOSIS — Z452 Encounter for adjustment and management of vascular access device: Secondary | ICD-10-CM

## 2013-11-07 MED ORDER — SODIUM CHLORIDE 0.9 % IJ SOLN
10.0000 mL | INTRAMUSCULAR | Status: DC | PRN
  Administered 2013-11-07: 10 mL
  Filled 2013-11-07: qty 10

## 2013-11-07 MED ORDER — HEPARIN SOD (PORK) LOCK FLUSH 100 UNIT/ML IV SOLN
500.0000 [IU] | Freq: Once | INTRAVENOUS | Status: AC | PRN
  Administered 2013-11-07: 500 [IU]
  Filled 2013-11-07: qty 5

## 2013-11-09 ENCOUNTER — Ambulatory Visit (INDEPENDENT_AMBULATORY_CARE_PROVIDER_SITE_OTHER): Payer: No Typology Code available for payment source | Admitting: Psychology

## 2013-11-09 DIAGNOSIS — F332 Major depressive disorder, recurrent severe without psychotic features: Secondary | ICD-10-CM

## 2013-11-19 ENCOUNTER — Ambulatory Visit: Payer: Self-pay | Admitting: Nurse Practitioner

## 2013-11-19 ENCOUNTER — Ambulatory Visit: Payer: No Typology Code available for payment source | Admitting: Nutrition

## 2013-11-19 ENCOUNTER — Other Ambulatory Visit: Payer: Self-pay

## 2013-11-19 ENCOUNTER — Other Ambulatory Visit (HOSPITAL_BASED_OUTPATIENT_CLINIC_OR_DEPARTMENT_OTHER): Payer: No Typology Code available for payment source

## 2013-11-19 ENCOUNTER — Telehealth: Payer: Self-pay | Admitting: Nurse Practitioner

## 2013-11-19 ENCOUNTER — Ambulatory Visit (HOSPITAL_BASED_OUTPATIENT_CLINIC_OR_DEPARTMENT_OTHER): Payer: No Typology Code available for payment source

## 2013-11-19 ENCOUNTER — Inpatient Hospital Stay: Payer: Self-pay

## 2013-11-19 ENCOUNTER — Other Ambulatory Visit: Payer: Self-pay | Admitting: Oncology

## 2013-11-19 ENCOUNTER — Telehealth: Payer: Self-pay | Admitting: *Deleted

## 2013-11-19 ENCOUNTER — Ambulatory Visit (HOSPITAL_BASED_OUTPATIENT_CLINIC_OR_DEPARTMENT_OTHER): Payer: No Typology Code available for payment source | Admitting: Nurse Practitioner

## 2013-11-19 VITALS — BP 143/80 | HR 74 | Temp 98.4°F | Resp 20 | Ht 66.0 in | Wt 174.0 lb

## 2013-11-19 DIAGNOSIS — D6959 Other secondary thrombocytopenia: Secondary | ICD-10-CM

## 2013-11-19 DIAGNOSIS — C182 Malignant neoplasm of ascending colon: Secondary | ICD-10-CM

## 2013-11-19 DIAGNOSIS — Z5111 Encounter for antineoplastic chemotherapy: Secondary | ICD-10-CM

## 2013-11-19 LAB — CBC WITH DIFFERENTIAL/PLATELET
BASO%: 0.5 % (ref 0.0–2.0)
BASOS ABS: 0 10*3/uL (ref 0.0–0.1)
EOS ABS: 0 10*3/uL (ref 0.0–0.5)
EOS%: 0.8 % (ref 0.0–7.0)
HEMATOCRIT: 40.5 % (ref 34.8–46.6)
HEMOGLOBIN: 13 g/dL (ref 11.6–15.9)
LYMPH%: 30.9 % (ref 14.0–49.7)
MCH: 25.6 pg (ref 25.1–34.0)
MCHC: 32 g/dL (ref 31.5–36.0)
MCV: 80 fL (ref 79.5–101.0)
MONO#: 0.3 10*3/uL (ref 0.1–0.9)
MONO%: 9.8 % (ref 0.0–14.0)
NEUT#: 1.9 10*3/uL (ref 1.5–6.5)
NEUT%: 58 % (ref 38.4–76.8)
PLATELETS: 89 10*3/uL — AB (ref 145–400)
RBC: 5.06 10*6/uL (ref 3.70–5.45)
RDW: 22 % — AB (ref 11.2–14.5)
WBC: 3.3 10*3/uL — ABNORMAL LOW (ref 3.9–10.3)
lymph#: 1 10*3/uL (ref 0.9–3.3)

## 2013-11-19 LAB — COMPREHENSIVE METABOLIC PANEL (CC13)
ALBUMIN: 3.7 g/dL (ref 3.5–5.0)
ALK PHOS: 97 U/L (ref 40–150)
ALT: 29 U/L (ref 0–55)
AST: 27 U/L (ref 5–34)
Anion Gap: 11 mEq/L (ref 3–11)
BUN: 13 mg/dL (ref 7.0–26.0)
CO2: 20 mEq/L — ABNORMAL LOW (ref 22–29)
CREATININE: 0.9 mg/dL (ref 0.6–1.1)
Calcium: 10.2 mg/dL (ref 8.4–10.4)
Chloride: 107 mEq/L (ref 98–109)
GLUCOSE: 177 mg/dL — AB (ref 70–140)
POTASSIUM: 3.6 meq/L (ref 3.5–5.1)
Sodium: 138 mEq/L (ref 136–145)
Total Bilirubin: 0.89 mg/dL (ref 0.20–1.20)
Total Protein: 7.3 g/dL (ref 6.4–8.3)

## 2013-11-19 MED ORDER — DEXAMETHASONE SODIUM PHOSPHATE 10 MG/ML IJ SOLN
INTRAMUSCULAR | Status: AC
  Filled 2013-11-19: qty 1

## 2013-11-19 MED ORDER — ONDANSETRON 8 MG/NS 50 ML IVPB
INTRAVENOUS | Status: AC
  Filled 2013-11-19: qty 8

## 2013-11-19 MED ORDER — DEXTROSE 5 % IV SOLN
Freq: Once | INTRAVENOUS | Status: AC
  Administered 2013-11-19: 10:00:00 via INTRAVENOUS

## 2013-11-19 MED ORDER — LEUCOVORIN CALCIUM INJECTION 350 MG
400.0000 mg/m2 | Freq: Once | INTRAMUSCULAR | Status: AC
  Administered 2013-11-19: 772 mg via INTRAVENOUS
  Filled 2013-11-19: qty 38.6

## 2013-11-19 MED ORDER — FLUOROURACIL CHEMO INJECTION 2.5 GM/50ML
400.0000 mg/m2 | Freq: Once | INTRAVENOUS | Status: AC
  Administered 2013-11-19: 750 mg via INTRAVENOUS
  Filled 2013-11-19: qty 15

## 2013-11-19 MED ORDER — ONDANSETRON 8 MG/50ML IVPB (CHCC)
8.0000 mg | Freq: Once | INTRAVENOUS | Status: AC
  Administered 2013-11-19: 8 mg via INTRAVENOUS

## 2013-11-19 MED ORDER — SODIUM CHLORIDE 0.9 % IV SOLN
2400.0000 mg/m2 | INTRAVENOUS | Status: DC
  Administered 2013-11-19: 4650 mg via INTRAVENOUS
  Filled 2013-11-19: qty 93

## 2013-11-19 MED ORDER — DEXAMETHASONE SODIUM PHOSPHATE 10 MG/ML IJ SOLN
10.0000 mg | Freq: Once | INTRAMUSCULAR | Status: AC
  Administered 2013-11-19: 10 mg via INTRAVENOUS

## 2013-11-19 MED ORDER — HEPARIN SOD (PORK) LOCK FLUSH 100 UNIT/ML IV SOLN
500.0000 [IU] | Freq: Once | INTRAVENOUS | Status: DC | PRN
  Filled 2013-11-19: qty 5

## 2013-11-19 MED ORDER — OXALIPLATIN CHEMO INJECTION 100 MG/20ML
85.0000 mg/m2 | Freq: Once | INTRAVENOUS | Status: AC
  Administered 2013-11-19: 165 mg via INTRAVENOUS
  Filled 2013-11-19: qty 33

## 2013-11-19 MED ORDER — SODIUM CHLORIDE 0.9 % IJ SOLN
10.0000 mL | INTRAMUSCULAR | Status: DC | PRN
  Filled 2013-11-19: qty 10

## 2013-11-19 NOTE — Telephone Encounter (Signed)
gave avs & cal. Sent MW mess to sch tx-[facility]

## 2013-11-19 NOTE — Progress Notes (Signed)
Nutrition followup completed in chemotherapy with patient, who is being treated for colon cancer.   Patient is pleased and reports her weight is up to 174 pounds October 15 her appetite and oral intake have improved.   She has not needed to drink oral nutrition supplements. Patient denies other nutrition side effects.  Nutrition diagnosis: Unintended weight loss improved.  Intervention: Patient educated to continue small, frequent meals and snacks utilizing calories and protein for weight maintenance. Teach back method used.  Monitoring, evaluation, goals: Patient has been able to increase oral intake for improved weight.   Next visit: Thursday, November 12, during chemotherapy.  **Disclaimer: This note was dictated with voice recognition software. Similar sounding words can inadvertently be transcribed and this note may contain transcription errors which may not have been corrected upon publication of note.**

## 2013-11-19 NOTE — Patient Instructions (Signed)
[facility] Discharge Instructions for Patients Receiving Chemotherapy  Today you received the following chemotherapy agents:  Oxaliplatin, Leucovorin and 5FU  To help prevent nausea and vomiting after your treatment, we encourage you to take your nausea medication as ordered per MD.   If you develop nausea and vomiting that is not controlled by your nausea medication, call the clinic.   BELOW ARE SYMPTOMS THAT SHOULD BE REPORTED IMMEDIATELY:  *FEVER GREATER THAN 100.5 F  *CHILLS WITH OR WITHOUT FEVER  NAUSEA AND VOMITING THAT IS NOT CONTROLLED WITH YOUR NAUSEA MEDICATION  *UNUSUAL SHORTNESS OF BREATH  *UNUSUAL BRUISING OR BLEEDING  TENDERNESS IN MOUTH AND THROAT WITH OR WITHOUT PRESENCE OF ULCERS  *URINARY PROBLEMS  *BOWEL PROBLEMS  UNUSUAL RASH Items with * indicate a potential emergency and should be followed up as soon as possible.  Feel free to call the clinic you have any questions or concerns. The clinic phone number is [phone].    

## 2013-11-19 NOTE — Progress Notes (Signed)
Treat despite PLT 89k per Ned Card. Pharmacy and treatment RN aware.

## 2013-11-19 NOTE — Telephone Encounter (Signed)
Per staff message and POF I have scheduled appts. Advised scheduler of appts. JMW Per staff message and POF I have scheduled appts. Advised scheduler of appts. JMW  

## 2013-11-19 NOTE — Progress Notes (Signed)
  [facility] OFFICE PROGRESS NOTE   Diagnosis: Colon cancer   INTERVAL HISTORY:   [name] returns as scheduled. She completed cycle 5 FOLFOX on 11/05/2013. The oxaliplatin was held due to thrombocytopenia.  She denies nausea/vomiting. No mouth sores. No diarrhea. She has had some constipation. No numbness or tingling in her hands or feet. Her appetite is better. She has gained some weight since her last visit. She denies pain. She has noted some bruises on her toes. She thinks the bruises occurred due to a particular type of shoe she was wearing. No bleeding.  Objective:  Vital signs in last 24 hours:  Blood pressure 143/80, pulse 74, temperature 98.4 F (36.9 C), temperature source Oral, resp. rate 20, height _0  (1.676 m), weight 174 lb (78.926 kg), SpO2 100.00%.    HEENT: No thrush or ulcers. Resp: Lungs clear bilaterally. Cardio: Regular rate and rhythm. GI: Abdomen soft and nontender. No hepatomegaly. Vascular: No leg edema. Calves soft and nontender. Neuro: Vibratory sense intact over the fingertips per tuning fork exam.  Skin: No rash. Small resolving ecchymoses dorsal aspect of multiple toes. Port-A-Cath site without erythema.    Lab Results:  Lab Results  Component Value Date   WBC 3.3* 11/19/2013   HGB 13.0 11/19/2013   HCT 40.5 11/19/2013   MCV 80.0 11/19/2013   PLT 89* 11/19/2013   NEUTROABS 1.9 11/19/2013    Imaging:  No results found.  Medications: I have reviewed the patient's current medications.  Assessment/Plan: 1. 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 The tumor returned microsatellite stable with no loss of mismatch repair protein expression  Cycle 1 adjuvant FOLFOX 09/08/2013  Cycle 2 adjuvant FOLFOX 09/24/2013  Cycle 3 adjuvant FOLFOX 10/08/2013.  Cycle 4 adjuvant FOLFOX 10/22/2013. Cycle 5 adjuvant FOLFOX 11/05/2013.  Oxaliplatin held due to thrombocytopenia. 2. iron deficiency anemia  3. seizure disorder  4. history of depression  5. 4 mm right lower lobe nodule on a staging chest CT 09/08/2013  6. Hospitalization 09/24/2013 through 09/26/2013 with fever and abdominal pain.  7. 09/24/2013 urine culture positive for coag negative staph.  8. Thrombocytopenia secondary to chemotherapy 11/05/2013. Oxaliplatin was held with treatment on 11/05/2013.   Disposition: [name] appears stable. She has completed 5 cycles of FOLFOX. Oxaliplatin was held with cycle 5 due to thrombocytopenia. The platelet count is better. Plan to proceed with cycle 6 FOLFOX today with oxaliplatin. She understands to contact the office with spontaneous bruising/bleeding. She will return for a followup visit and cycle 7 in 2 weeks.  Plan reviewed with Dr. Benay Spice.  Ned Card ANP/GNP-BC   11/19/2013  9:17 AM

## 2013-11-20 ENCOUNTER — Ambulatory Visit (INDEPENDENT_AMBULATORY_CARE_PROVIDER_SITE_OTHER): Payer: No Typology Code available for payment source | Admitting: Psychology

## 2013-11-20 DIAGNOSIS — F332 Major depressive disorder, recurrent severe without psychotic features: Secondary | ICD-10-CM

## 2013-11-21 ENCOUNTER — Ambulatory Visit (HOSPITAL_BASED_OUTPATIENT_CLINIC_OR_DEPARTMENT_OTHER): Payer: No Typology Code available for payment source

## 2013-11-21 VITALS — BP 123/82 | HR 89 | Temp 98.2°F | Resp 18

## 2013-11-21 DIAGNOSIS — C182 Malignant neoplasm of ascending colon: Secondary | ICD-10-CM

## 2013-11-21 DIAGNOSIS — Z452 Encounter for adjustment and management of vascular access device: Secondary | ICD-10-CM

## 2013-11-21 MED ORDER — SODIUM CHLORIDE 0.9 % IJ SOLN
10.0000 mL | INTRAMUSCULAR | Status: DC | PRN
  Administered 2013-11-21: 10 mL
  Filled 2013-11-21: qty 10

## 2013-11-21 MED ORDER — HEPARIN SOD (PORK) LOCK FLUSH 100 UNIT/ML IV SOLN
500.0000 [IU] | Freq: Once | INTRAVENOUS | Status: AC | PRN
  Administered 2013-11-21: 500 [IU]
  Filled 2013-11-21: qty 5

## 2013-11-23 ENCOUNTER — Other Ambulatory Visit: Payer: Self-pay | Admitting: Family

## 2013-11-23 DIAGNOSIS — Z1231 Encounter for screening mammogram for malignant neoplasm of breast: Secondary | ICD-10-CM

## 2013-11-24 ENCOUNTER — Ambulatory Visit (HOSPITAL_BASED_OUTPATIENT_CLINIC_OR_DEPARTMENT_OTHER): Payer: Self-pay

## 2013-11-29 ENCOUNTER — Other Ambulatory Visit: Payer: Self-pay | Admitting: Oncology

## 2013-11-30 ENCOUNTER — Ambulatory Visit (INDEPENDENT_AMBULATORY_CARE_PROVIDER_SITE_OTHER): Payer: No Typology Code available for payment source | Admitting: General Surgery

## 2013-11-30 ENCOUNTER — Ambulatory Visit (HOSPITAL_BASED_OUTPATIENT_CLINIC_OR_DEPARTMENT_OTHER): Payer: Self-pay

## 2013-12-03 ENCOUNTER — Telehealth: Payer: Self-pay | Admitting: Oncology

## 2013-12-03 ENCOUNTER — Ambulatory Visit (HOSPITAL_BASED_OUTPATIENT_CLINIC_OR_DEPARTMENT_OTHER): Payer: No Typology Code available for payment source

## 2013-12-03 ENCOUNTER — Telehealth: Payer: Self-pay | Admitting: *Deleted

## 2013-12-03 ENCOUNTER — Ambulatory Visit (INDEPENDENT_AMBULATORY_CARE_PROVIDER_SITE_OTHER): Payer: No Typology Code available for payment source | Admitting: Psychology

## 2013-12-03 ENCOUNTER — Other Ambulatory Visit (HOSPITAL_BASED_OUTPATIENT_CLINIC_OR_DEPARTMENT_OTHER): Payer: No Typology Code available for payment source

## 2013-12-03 ENCOUNTER — Ambulatory Visit (HOSPITAL_BASED_OUTPATIENT_CLINIC_OR_DEPARTMENT_OTHER): Payer: No Typology Code available for payment source | Admitting: Oncology

## 2013-12-03 VITALS — BP 141/83 | HR 93 | Temp 98.6°F | Resp 18 | Ht 66.0 in | Wt 171.5 lb

## 2013-12-03 DIAGNOSIS — C182 Malignant neoplasm of ascending colon: Secondary | ICD-10-CM

## 2013-12-03 DIAGNOSIS — D509 Iron deficiency anemia, unspecified: Secondary | ICD-10-CM

## 2013-12-03 DIAGNOSIS — Z5111 Encounter for antineoplastic chemotherapy: Secondary | ICD-10-CM

## 2013-12-03 DIAGNOSIS — D6959 Other secondary thrombocytopenia: Secondary | ICD-10-CM

## 2013-12-03 DIAGNOSIS — F332 Major depressive disorder, recurrent severe without psychotic features: Secondary | ICD-10-CM

## 2013-12-03 LAB — CBC WITH DIFFERENTIAL/PLATELET
BASO%: 0 % (ref 0.0–2.0)
BASOS ABS: 0 10*3/uL (ref 0.0–0.1)
EOS ABS: 0 10*3/uL (ref 0.0–0.5)
EOS%: 0.7 % (ref 0.0–7.0)
HCT: 39 % (ref 34.8–46.6)
HEMOGLOBIN: 12.6 g/dL (ref 11.6–15.9)
LYMPH%: 31.9 % (ref 14.0–49.7)
MCH: 26.8 pg (ref 25.1–34.0)
MCHC: 32.3 g/dL (ref 31.5–36.0)
MCV: 83 fL (ref 79.5–101.0)
MONO#: 0.3 10*3/uL (ref 0.1–0.9)
MONO%: 12.6 % (ref 0.0–14.0)
NEUT#: 1.5 10*3/uL (ref 1.5–6.5)
NEUT%: 54.8 % (ref 38.4–76.8)
NRBC: 1 % — AB (ref 0–0)
Platelets: 58 10*3/uL — ABNORMAL LOW (ref 145–400)
RBC: 4.7 10*6/uL (ref 3.70–5.45)
RDW: 18.2 % — AB (ref 11.2–14.5)
WBC: 2.7 10*3/uL — AB (ref 3.9–10.3)
lymph#: 0.9 10*3/uL (ref 0.9–3.3)

## 2013-12-03 LAB — COMPREHENSIVE METABOLIC PANEL (CC13)
ALT: 28 U/L (ref 0–55)
ANION GAP: 8 meq/L (ref 3–11)
AST: 31 U/L (ref 5–34)
Albumin: 3.7 g/dL (ref 3.5–5.0)
Alkaline Phosphatase: 102 U/L (ref 40–150)
BUN: 9.9 mg/dL (ref 7.0–26.0)
CALCIUM: 9.8 mg/dL (ref 8.4–10.4)
CHLORIDE: 108 meq/L (ref 98–109)
CO2: 24 meq/L (ref 22–29)
Creatinine: 0.8 mg/dL (ref 0.6–1.1)
Glucose: 124 mg/dl (ref 70–140)
Potassium: 3.8 mEq/L (ref 3.5–5.1)
SODIUM: 141 meq/L (ref 136–145)
TOTAL PROTEIN: 7 g/dL (ref 6.4–8.3)
Total Bilirubin: 1.09 mg/dL (ref 0.20–1.20)

## 2013-12-03 MED ORDER — SODIUM CHLORIDE 0.9 % IV SOLN
2400.0000 mg/m2 | INTRAVENOUS | Status: DC
  Administered 2013-12-03: 4650 mg via INTRAVENOUS
  Filled 2013-12-03: qty 93

## 2013-12-03 MED ORDER — LEUCOVORIN CALCIUM INJECTION 350 MG
400.0000 mg/m2 | Freq: Once | INTRAMUSCULAR | Status: AC
  Administered 2013-12-03: 772 mg via INTRAVENOUS
  Filled 2013-12-03: qty 38.6

## 2013-12-03 MED ORDER — FLUOROURACIL CHEMO INJECTION 2.5 GM/50ML
400.0000 mg/m2 | Freq: Once | INTRAVENOUS | Status: AC
  Administered 2013-12-03: 750 mg via INTRAVENOUS
  Filled 2013-12-03: qty 15

## 2013-12-03 NOTE — Telephone Encounter (Signed)
Pt confirmed labs/ov per 10/29 POF, sent msg to add Folfox, gave pt AVS...Marland KitchenMarland KitchenMarland Kitchen KJ

## 2013-12-03 NOTE — Progress Notes (Signed)
  [facility] OFFICE PROGRESS NOTE   Diagnosis: Colon cancer  INTERVAL HISTORY:   She returns as scheduled. She completed another cycle of FOLFOX on 11/19/2013. She had cold sensitivity for 5 days following chemotherapy. No other neuropathy symptoms. No nausea/vomiting or diarrhea. No hand or foot pain. She has noticed "bruising" over the toes after wearing clogs.  Objective:  Vital signs in last 24 hours:  Blood pressure 141/83, pulse 93, temperature 98.6 F (37 C), temperature source Oral, resp. rate 18, height _0  (1.676 m), weight 171 lb 8 oz (77.792 kg), SpO2 100.00%.    HEENT: No thrush or ulcers Resp: Lungs clear bilaterally Cardio: Regular rate and rhythm GI: No hepatomegaly, nontender Vascular: No leg edema Neuro: The vibratory sense is intact at the fingertips bilaterally  Skin: Hyperpigmentation at the dorsum of the toes and fingers, no bruising   Portacath/PICC-without erythema  Lab Results:  Lab Results  Component Value Date   WBC 2.7* 12/03/2013   HGB 12.6 12/03/2013   HCT 39.0 12/03/2013   MCV 83.0 12/03/2013   PLT 58* 12/03/2013   NEUTROABS 1.5 12/03/2013     Medications: I have reviewed the patient's current medications.  Assessment/Plan: 1. 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 The tumor returned microsatellite stable with no loss of mismatch repair protein expression  Cycle 1 adjuvant FOLFOX 09/08/2013  Cycle 2 adjuvant FOLFOX 09/24/2013  Cycle 3 adjuvant FOLFOX 10/08/2013.  Cycle 4 adjuvant FOLFOX 10/22/2013.  Cycle 5 adjuvant FOLFOX 11/05/2013. Oxaliplatin held due to thrombocytopenia. Cycle 6 FOLFOX 11/19/2013 Cycle 7 FOLFOX 12/03/2013. Oxaliplatin held secondary to thrombocytopenia 2. iron deficiency anemia  3. seizure disorder  4. history of depression  5. 4 mm right lower lobe nodule on a staging chest CT 09/08/2013    6. Hospitalization 09/24/2013 through 09/26/2013 with fever and abdominal pain.  7. 09/24/2013 urine culture positive for coag negative staph.  8. Thrombocytopenia secondary to chemotherapy 11/05/2013. Oxaliplatin was held with treatment on 11/05/2013 and 12/03/2013  Disposition:  [name] has completed 6 cycles of FOLFOX. She is tolerating the chemotherapy well. She has developed thrombocytopenia secondary to chemotherapy. Oxaliplatin will be held from the chemotherapy regimen today. She will return for an office visit and chemotherapy in 2 weeks. She knows to contact us for bleeding or bruising.  Betsy Coder, MD  12/03/2013  11:07 AM

## 2013-12-03 NOTE — Progress Notes (Signed)
Per MD, patient will not receive oxaliplatin or premedications.

## 2013-12-03 NOTE — Telephone Encounter (Signed)
Per staff message and POF I have scheduled appts. Advised scheduler of appts. JMW  

## 2013-12-04 ENCOUNTER — Telehealth: Payer: Self-pay | Admitting: Neurology

## 2013-12-04 NOTE — Telephone Encounter (Signed)
Received request by mail from [facility] requesting medical records. Request sent to HIM at [facility] via interoffice mail for processing / Sherri S.

## 2013-12-05 ENCOUNTER — Other Ambulatory Visit: Payer: Self-pay | Admitting: Oncology

## 2013-12-05 ENCOUNTER — Ambulatory Visit (HOSPITAL_BASED_OUTPATIENT_CLINIC_OR_DEPARTMENT_OTHER): Payer: No Typology Code available for payment source

## 2013-12-05 VITALS — BP 127/67 | HR 88 | Temp 98.3°F | Resp 18

## 2013-12-05 DIAGNOSIS — Z452 Encounter for adjustment and management of vascular access device: Secondary | ICD-10-CM

## 2013-12-05 DIAGNOSIS — C182 Malignant neoplasm of ascending colon: Secondary | ICD-10-CM

## 2013-12-05 MED ORDER — HEPARIN SOD (PORK) LOCK FLUSH 100 UNIT/ML IV SOLN
500.0000 [IU] | Freq: Once | INTRAVENOUS | Status: AC | PRN
  Administered 2013-12-05: 500 [IU]
  Filled 2013-12-05: qty 5

## 2013-12-05 MED ORDER — SODIUM CHLORIDE 0.9 % IJ SOLN
10.0000 mL | INTRAMUSCULAR | Status: DC | PRN
  Administered 2013-12-05: 10 mL
  Filled 2013-12-05: qty 10

## 2013-12-05 NOTE — Patient Instructions (Signed)
[facility] Discharge Instructions for Patients Receiving Chemotherapy  Today you received the following chemotherapy agents 5FU.  To help prevent nausea and vomiting after your treatment, we encourage you to take your nausea medication.   If you develop nausea and vomiting that is not controlled by your nausea medication, call the clinic.   BELOW ARE SYMPTOMS THAT SHOULD BE REPORTED IMMEDIATELY:  *FEVER GREATER THAN 100.5 F  *CHILLS WITH OR WITHOUT FEVER  NAUSEA AND VOMITING THAT IS NOT CONTROLLED WITH YOUR NAUSEA MEDICATION  *UNUSUAL SHORTNESS OF BREATH  *UNUSUAL BRUISING OR BLEEDING  TENDERNESS IN MOUTH AND THROAT WITH OR WITHOUT PRESENCE OF ULCERS  *URINARY PROBLEMS  *BOWEL PROBLEMS  UNUSUAL RASH Items with * indicate a potential emergency and should be followed up as soon as possible.  Feel free to call the clinic you have any questions or concerns. The clinic phone number is [phone].    

## 2013-12-13 ENCOUNTER — Other Ambulatory Visit: Payer: Self-pay | Admitting: Oncology

## 2013-12-15 ENCOUNTER — Ambulatory Visit (INDEPENDENT_AMBULATORY_CARE_PROVIDER_SITE_OTHER): Payer: No Typology Code available for payment source | Admitting: Psychology

## 2013-12-15 ENCOUNTER — Ambulatory Visit (HOSPITAL_BASED_OUTPATIENT_CLINIC_OR_DEPARTMENT_OTHER)
Admission: RE | Admit: 2013-12-15 | Discharge: 2013-12-15 | Disposition: A | Discharge status: Home / Self Care | Payer: No Typology Code available for payment source | Source: Ambulatory Visit | Attending: Family | Admitting: Family

## 2013-12-15 DIAGNOSIS — Z1231 Encounter for screening mammogram for malignant neoplasm of breast: Secondary | ICD-10-CM | POA: Insufficient documentation

## 2013-12-15 DIAGNOSIS — F332 Major depressive disorder, recurrent severe without psychotic features: Secondary | ICD-10-CM

## 2013-12-15 DIAGNOSIS — R928 Other abnormal and inconclusive findings on diagnostic imaging of breast: Secondary | ICD-10-CM | POA: Diagnosis not present

## 2013-12-17 ENCOUNTER — Other Ambulatory Visit (HOSPITAL_BASED_OUTPATIENT_CLINIC_OR_DEPARTMENT_OTHER): Payer: No Typology Code available for payment source

## 2013-12-17 ENCOUNTER — Ambulatory Visit: Payer: No Typology Code available for payment source | Admitting: Nutrition

## 2013-12-17 ENCOUNTER — Ambulatory Visit (HOSPITAL_BASED_OUTPATIENT_CLINIC_OR_DEPARTMENT_OTHER): Payer: No Typology Code available for payment source

## 2013-12-17 ENCOUNTER — Ambulatory Visit (HOSPITAL_BASED_OUTPATIENT_CLINIC_OR_DEPARTMENT_OTHER): Payer: No Typology Code available for payment source | Admitting: Nurse Practitioner

## 2013-12-17 ENCOUNTER — Telehealth: Payer: Self-pay | Admitting: Oncology

## 2013-12-17 VITALS — BP 120/76 | HR 88 | Temp 98.7°F | Resp 18 | Ht 66.0 in | Wt 175.5 lb

## 2013-12-17 DIAGNOSIS — Z5111 Encounter for antineoplastic chemotherapy: Secondary | ICD-10-CM

## 2013-12-17 DIAGNOSIS — C182 Malignant neoplasm of ascending colon: Secondary | ICD-10-CM

## 2013-12-17 LAB — COMPREHENSIVE METABOLIC PANEL (CC13)
ALBUMIN: 3.7 g/dL (ref 3.5–5.0)
ALT: 20 U/L (ref 0–55)
AST: 26 U/L (ref 5–34)
Alkaline Phosphatase: 95 U/L (ref 40–150)
Anion Gap: 6 mEq/L (ref 3–11)
BUN: 10.6 mg/dL (ref 7.0–26.0)
CALCIUM: 9.9 mg/dL (ref 8.4–10.4)
CO2: 27 meq/L (ref 22–29)
Chloride: 105 mEq/L (ref 98–109)
Creatinine: 0.9 mg/dL (ref 0.6–1.1)
GLUCOSE: 157 mg/dL — AB (ref 70–140)
POTASSIUM: 4.2 meq/L (ref 3.5–5.1)
SODIUM: 137 meq/L (ref 136–145)
TOTAL PROTEIN: 7.2 g/dL (ref 6.4–8.3)
Total Bilirubin: 1.12 mg/dL (ref 0.20–1.20)

## 2013-12-17 LAB — CBC WITH DIFFERENTIAL/PLATELET
BASO%: 0.3 % (ref 0.0–2.0)
BASOS ABS: 0 10*3/uL (ref 0.0–0.1)
EOS ABS: 0 10*3/uL (ref 0.0–0.5)
EOS%: 0.5 % (ref 0.0–7.0)
HCT: 41.6 % (ref 34.8–46.6)
HEMOGLOBIN: 13.3 g/dL (ref 11.6–15.9)
LYMPH%: 20.7 % (ref 14.0–49.7)
MCH: 26.8 pg (ref 25.1–34.0)
MCHC: 31.8 g/dL (ref 31.5–36.0)
MCV: 84.3 fL (ref 79.5–101.0)
MONO#: 0.5 10*3/uL (ref 0.1–0.9)
MONO%: 8.3 % (ref 0.0–14.0)
NEUT%: 70.2 % (ref 38.4–76.8)
NEUTROS ABS: 4.1 10*3/uL (ref 1.5–6.5)
PLATELETS: 95 10*3/uL — AB (ref 145–400)
RBC: 4.94 10*6/uL (ref 3.70–5.45)
RDW: 19.1 % — AB (ref 11.2–14.5)
WBC: 5.8 10*3/uL (ref 3.9–10.3)
lymph#: 1.2 10*3/uL (ref 0.9–3.3)

## 2013-12-17 MED ORDER — FLUOROURACIL CHEMO INJECTION 2.5 GM/50ML
400.0000 mg/m2 | Freq: Once | INTRAVENOUS | Status: AC
  Administered 2013-12-17: 750 mg via INTRAVENOUS
  Filled 2013-12-17: qty 15

## 2013-12-17 MED ORDER — ONDANSETRON 8 MG/NS 50 ML IVPB
INTRAVENOUS | Status: AC
  Filled 2013-12-17: qty 8

## 2013-12-17 MED ORDER — DEXTROSE 5 % IV SOLN
Freq: Once | INTRAVENOUS | Status: AC
  Administered 2013-12-17: 11:00:00 via INTRAVENOUS

## 2013-12-17 MED ORDER — ONDANSETRON 8 MG/50ML IVPB (CHCC)
8.0000 mg | Freq: Once | INTRAVENOUS | Status: AC
  Administered 2013-12-17: 8 mg via INTRAVENOUS

## 2013-12-17 MED ORDER — LEUCOVORIN CALCIUM INJECTION 350 MG
400.0000 mg/m2 | Freq: Once | INTRAVENOUS | Status: AC
  Administered 2013-12-17: 772 mg via INTRAVENOUS
  Filled 2013-12-17: qty 38.6

## 2013-12-17 MED ORDER — DEXAMETHASONE SODIUM PHOSPHATE 10 MG/ML IJ SOLN
INTRAMUSCULAR | Status: AC
  Filled 2013-12-17: qty 1

## 2013-12-17 MED ORDER — SODIUM CHLORIDE 0.9 % IV SOLN
2400.0000 mg/m2 | INTRAVENOUS | Status: DC
  Administered 2013-12-17: 4650 mg via INTRAVENOUS
  Filled 2013-12-17: qty 93

## 2013-12-17 MED ORDER — OXALIPLATIN CHEMO INJECTION 100 MG/20ML
85.0000 mg/m2 | Freq: Once | INTRAVENOUS | Status: AC
  Administered 2013-12-17: 165 mg via INTRAVENOUS
  Filled 2013-12-17: qty 33

## 2013-12-17 MED ORDER — DEXAMETHASONE SODIUM PHOSPHATE 10 MG/ML IJ SOLN
10.0000 mg | Freq: Once | INTRAMUSCULAR | Status: AC
  Administered 2013-12-17: 10 mg via INTRAVENOUS

## 2013-12-17 NOTE — Patient Instructions (Signed)
[facility] Discharge Instructions for Patients Receiving Chemotherapy  Today you received the following chemotherapy agents: Oxaliplatin, Leucovorin, 5FU   To help prevent nausea and vomiting after your treatment, we encourage you to take your nausea medication as prescribed.    If you develop nausea and vomiting that is not controlled by your nausea medication, call the clinic.   BELOW ARE SYMPTOMS THAT SHOULD BE REPORTED IMMEDIATELY:  *FEVER GREATER THAN 100.5 F  *CHILLS WITH OR WITHOUT FEVER  NAUSEA AND VOMITING THAT IS NOT CONTROLLED WITH YOUR NAUSEA MEDICATION  *UNUSUAL SHORTNESS OF BREATH  *UNUSUAL BRUISING OR BLEEDING  TENDERNESS IN MOUTH AND THROAT WITH OR WITHOUT PRESENCE OF ULCERS  *URINARY PROBLEMS  *BOWEL PROBLEMS  UNUSUAL RASH Items with * indicate a potential emergency and should be followed up as soon as possible.  Feel free to call the clinic you have any questions or concerns. The clinic phone number is [phone].    

## 2013-12-17 NOTE — Progress Notes (Signed)
  [facility] OFFICE PROGRESS NOTE   Diagnosis:  Colon cancer  INTERVAL HISTORY:   [name] returns as scheduled. She completed cycle 7 FOLFOX 12/03/2013. Oxaliplatin was held due to thrombocytopenia. No nausea or vomiting around the chemotherapy. She developed nausea last night after eating eggs. She also has some "gas pain". No mouth sores or diarrhea. She denies hand or foot pain. She notes mild erythema over the palms. No numbness or tingling in her hands or feet.  Objective:  Vital signs in last 24 hours:  Blood pressure 120/76, pulse 88, temperature 98.7 F (37.1 C), temperature source Oral, resp. rate 18, height _0  (1.676 m), weight 175 lb 8 oz (79.606 kg), SpO2 100 %.    HEENT: no thrush or ulcers. Resp: lungs clear bilaterally. Cardio: regular rate and rhythm. GI: abdomen soft and nontender. No hepatomegaly. Vascular: no leg edema. Calves soft and nontender. Neuro:vibratory sense intact over the fingertips per tuning fork exam.  Skin:palms with mild erythema.    Lab Results:  Lab Results  Component Value Date   WBC 5.8 12/17/2013   HGB 13.3 12/17/2013   HCT 41.6 12/17/2013   MCV 84.3 12/17/2013   PLT 95* 12/17/2013   NEUTROABS 4.1 12/17/2013    Imaging:  No results found.  Medications: I have reviewed the patient's current medications.  Assessment/Plan: 1. 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  The tumor returned microsatellite stable with no loss of mismatch repair protein expression   Cycle 1 adjuvant FOLFOX 09/08/2013   Cycle 2 adjuvant FOLFOX 09/24/2013   Cycle 3 adjuvant FOLFOX 10/08/2013.   Cycle 4 adjuvant FOLFOX 10/22/2013.   Cycle 5 adjuvant FOLFOX 11/05/2013. Oxaliplatin held due to thrombocytopenia.  Cycle 6 FOLFOX 11/19/2013.  Cycle 7 FOLFOX 12/03/2013. Oxaliplatin held secondary to  thrombocytopenia.  Cycle 8 FOLFOX 12/17/2013. 2. iron deficiency anemia  3. seizure disorder  4. history of depression  5. 4 mm right lower lobe nodule on a staging chest CT 09/08/2013  6. Hospitalization 09/24/2013 through 09/26/2013 with fever and abdominal pain.  7. 09/24/2013 urine culture positive for coag negative staph.  8. Thrombocytopenia secondary to chemotherapy 11/05/2013. Oxaliplatin was held with treatment on 11/05/2013 and 12/03/2013.   Disposition: [name] appears stable. She has completed 7 cycles of FOLFOX. Oxaliplatin was held with cycle 7 due to thrombocytopenia. The platelet count is better. Plan to proceed with cycle 8 FOLFOX today as scheduled. She understands to contact the office with bleeding or bruising.  She will return for a followup visit and cycle 9 FOLFOX on 01/04/2014. She will contact the office in the interim as outlined above or with any other problems.  Plan reviewed with Dr. Benay Spice.   Ned Card ANP/GNP-BC   12/17/2013  10:05 AM

## 2013-12-17 NOTE — Progress Notes (Signed)
Discharged at 1422, ambulatory in no distress.

## 2013-12-17 NOTE — Progress Notes (Signed)
Brief follow up completed with patient in chemotherapy.  She is receiving treatment for colon cancer. Weight increased and documented as 175.5 pounds. Overall appetite and oral intake have improved. Patient reporting nausea today.  Nutrition diagnosis: Unintended weight loss improved.  Intervention: Reviewed strategies for eating with nausea. Recommended weight maintenance. Teach back method used.  Monitoring, evaluation, goals: Patient has been able to consume adequate calories and protein for weight stabilization.  Next visit:Thursday, December 31, during chemotherapy.  **Disclaimer: This note was dictated with voice recognition software. Similar sounding words can inadvertently be transcribed and this note may contain transcription errors which may not have been corrected upon publication of note.**

## 2013-12-17 NOTE — Progress Notes (Signed)
Okay to treat despite counts per Dr. Gearldine Shown office note.

## 2013-12-17 NOTE — Telephone Encounter (Signed)
gv and printed appt shced and avs for pt for NOV and DEC...sed added tx.

## 2013-12-18 ENCOUNTER — Ambulatory Visit (HOSPITAL_BASED_OUTPATIENT_CLINIC_OR_DEPARTMENT_OTHER): Payer: No Typology Code available for payment source | Admitting: Nurse Practitioner

## 2013-12-18 ENCOUNTER — Ambulatory Visit (HOSPITAL_BASED_OUTPATIENT_CLINIC_OR_DEPARTMENT_OTHER): Payer: No Typology Code available for payment source

## 2013-12-18 ENCOUNTER — Telehealth: Payer: Self-pay | Admitting: *Deleted

## 2013-12-18 ENCOUNTER — Other Ambulatory Visit: Payer: Self-pay | Admitting: *Deleted

## 2013-12-18 VITALS — BP 106/54 | HR 92 | Temp 99.4°F | Resp 18

## 2013-12-18 VITALS — BP 101/59 | HR 108 | Temp 100.3°F | Resp 21 | Ht 66.0 in | Wt 173.7 lb

## 2013-12-18 DIAGNOSIS — C182 Malignant neoplasm of ascending colon: Secondary | ICD-10-CM

## 2013-12-18 DIAGNOSIS — R110 Nausea: Secondary | ICD-10-CM

## 2013-12-18 DIAGNOSIS — R509 Fever, unspecified: Secondary | ICD-10-CM | POA: Insufficient documentation

## 2013-12-18 DIAGNOSIS — E860 Dehydration: Secondary | ICD-10-CM

## 2013-12-18 LAB — URINALYSIS, MICROSCOPIC - CHCC
BILIRUBIN (URINE): NEGATIVE
Glucose: 100 mg/dL
Ketones: NEGATIVE mg/dL
LEUKOCYTE ESTERASE: NEGATIVE
NITRITE: NEGATIVE
Protein: 30 mg/dL
Specific Gravity, Urine: 1.03 (ref 1.003–1.035)
UROBILINOGEN UR: 0.2 mg/dL (ref 0.2–1)
pH: 5 (ref 4.6–8.0)

## 2013-12-18 MED ORDER — HEPARIN SOD (PORK) LOCK FLUSH 100 UNIT/ML IV SOLN
500.0000 [IU] | Freq: Once | INTRAVENOUS | Status: AC
  Administered 2013-12-18: 500 [IU] via INTRAVENOUS
  Filled 2013-12-18: qty 5

## 2013-12-18 MED ORDER — ACETAMINOPHEN 325 MG PO TABS
ORAL_TABLET | ORAL | Status: AC
  Filled 2013-12-18: qty 2

## 2013-12-18 MED ORDER — LEVOFLOXACIN 500 MG PO TABS: 500.0000 mg | ORAL_TABLET | Freq: Every day | ORAL | Status: DC

## 2013-12-18 MED ORDER — ACETAMINOPHEN 325 MG PO TABS
650.0000 mg | ORAL_TABLET | Freq: Once | ORAL | Status: AC
  Administered 2013-12-18: 650 mg via ORAL

## 2013-12-18 MED ORDER — SODIUM CHLORIDE 0.9 % IJ SOLN
10.0000 mL | Freq: Once | INTRAMUSCULAR | Status: AC
  Administered 2013-12-18: 10 mL via INTRAVENOUS
  Filled 2013-12-18: qty 10

## 2013-12-18 MED ORDER — SODIUM CHLORIDE 0.9 % IV SOLN
INTRAVENOUS | Status: AC
  Administered 2013-12-18: 12:00:00 via INTRAVENOUS

## 2013-12-18 NOTE — Patient Instructions (Signed)
Dehydration, Adult Dehydration is when you lose more fluids from the body than you take in. Vital organs like the kidneys, brain, and heart cannot function without a proper amount of fluids and salt. Any loss of fluids from the body can cause dehydration.  CAUSES   Vomiting.  Diarrhea.  Excessive sweating.  Excessive urine output.  Fever. SYMPTOMS  Mild dehydration  Thirst.  Dry lips.  Slightly dry mouth. Moderate dehydration  Very dry mouth.  Sunken eyes.  Skin does not bounce back quickly when lightly pinched and released.  Dark urine and decreased urine production.  Decreased tear production.  Headache. Severe dehydration  Very dry mouth.  Extreme thirst.  Rapid, weak pulse (more than 100 beats per minute at rest).  Cold hands and feet.  Not able to sweat in spite of heat and temperature.  Rapid breathing.  Blue lips.  Confusion and lethargy.  Difficulty being awakened.  Minimal urine production.  No tears. DIAGNOSIS  Your caregiver will diagnose dehydration based on your symptoms and your exam. Blood and urine tests will help confirm the diagnosis. The diagnostic evaluation should also identify the cause of dehydration. TREATMENT  Treatment of mild or moderate dehydration can often be done at home by increasing the amount of fluids that you drink. It is best to drink small amounts of fluid more often. Drinking too much at one time can make vomiting worse. Refer to the home care instructions below. Severe dehydration needs to be treated at the hospital where you will probably be given intravenous (IV) fluids that contain water and electrolytes. HOME CARE INSTRUCTIONS   Ask your caregiver about specific rehydration instructions.  Drink enough fluids to keep your urine clear or pale yellow.  Drink small amounts frequently if you have nausea and vomiting.  Eat as you normally do.  Avoid:  Foods or drinks high in sugar.  Carbonated  drinks.  Juice.  Extremely hot or cold fluids.  Drinks with caffeine.  Fatty, greasy foods.  Alcohol.  Tobacco.  Overeating.  Gelatin desserts.  Wash your hands well to avoid spreading bacteria and viruses.  Only take over-the-counter or prescription medicines for pain, discomfort, or fever as directed by your caregiver.  Ask your caregiver if you should continue all prescribed and over-the-counter medicines.  Keep all follow-up appointments with your caregiver. SEEK MEDICAL CARE IF:  You have abdominal pain and it increases or stays in one area (localizes).  You have a rash, stiff neck, or severe headache.  You are irritable, sleepy, or difficult to awaken.  You are weak, dizzy, or extremely thirsty. SEEK IMMEDIATE MEDICAL CARE IF:   You are unable to keep fluids down or you get worse despite treatment.  You have frequent episodes of vomiting or diarrhea.  You have blood or green matter (bile) in your vomit.  You have blood in your stool or your stool looks black and tarry.  You have not urinated in 6 to 8 hours, or you have only urinated a small amount of very dark urine.  You have a fever.  You faint. MAKE SURE YOU:   Understand these instructions.  Will watch your condition.  Will get help right away if you are not doing well or get worse. Document Released: 01/22/2005 Document Revised: 04/16/2011 Document Reviewed: 09/11/2010 ExitCare Patient Information 2015 [employer]. This information is not intended to replace advice given to you by your health care provider. Make sure you discuss any questions you have with your health care   provider.  Nausea and Vomiting Nausea is a sick feeling that often comes before throwing up (vomiting). Vomiting is a reflex where stomach contents come out of your mouth. Vomiting can cause severe loss of body fluids (dehydration). Children and elderly adults can become dehydrated quickly, especially if they also have  diarrhea. Nausea and vomiting are symptoms of a condition or disease. It is important to find the cause of your symptoms. CAUSES   Direct irritation of the stomach lining. This irritation can result from increased acid production (gastroesophageal reflux disease), infection, food poisoning, taking certain medicines (such as nonsteroidal anti-inflammatory drugs), alcohol use, or tobacco use.  Signals from the brain.These signals could be caused by a headache, heat exposure, an inner ear disturbance, increased pressure in the brain from injury, infection, a tumor, or a concussion, pain, emotional stimulus, or metabolic problems.  An obstruction in the gastrointestinal tract (bowel obstruction).  Illnesses such as diabetes, hepatitis, gallbladder problems, appendicitis, kidney problems, cancer, sepsis, atypical symptoms of a heart attack, or eating disorders.  Medical treatments such as chemotherapy and radiation.  Receiving medicine that makes you sleep (general anesthetic) during surgery. DIAGNOSIS Your caregiver may ask for tests to be done if the problems do not improve after a few days. Tests may also be done if symptoms are severe or if the reason for the nausea and vomiting is not clear. Tests may include:  Urine tests.  Blood tests.  Stool tests.  Cultures (to look for evidence of infection).  X-rays or other imaging studies. Test results can help your caregiver make decisions about treatment or the need for additional tests. TREATMENT You need to stay well hydrated. Drink frequently but in small amounts.You may wish to drink water, sports drinks, clear broth, or eat frozen ice pops or gelatin dessert to help stay hydrated.When you eat, eating slowly may help prevent nausea.There are also some antinausea medicines that may help prevent nausea. HOME CARE INSTRUCTIONS   Take all medicine as directed by your caregiver.  If you do not have an appetite, do not force yourself to  eat. However, you must continue to drink fluids.  If you have an appetite, eat a normal diet unless your caregiver tells you differently.  Eat a variety of complex carbohydrates (rice, wheat, potatoes, bread), lean meats, yogurt, fruits, and vegetables.  Avoid high-fat foods because they are more difficult to digest.  Drink enough water and fluids to keep your urine clear or pale yellow.  If you are dehydrated, ask your caregiver for specific rehydration instructions. Signs of dehydration may include:  Severe thirst.  Dry lips and mouth.  Dizziness.  Dark urine.  Decreasing urine frequency and amount.  Confusion.  Rapid breathing or pulse. SEEK IMMEDIATE MEDICAL CARE IF:   You have blood or Laubscher flecks (like coffee grounds) in your vomit.  You have black or bloody stools.  You have a severe headache or stiff neck.  You are confused.  You have severe abdominal pain.  You have chest pain or trouble breathing.  You do not urinate at least once every 8 hours.  You develop cold or clammy skin.  You continue to vomit for longer than 24 to 48 hours.  You have a fever. MAKE SURE YOU:   Understand these instructions.  Will watch your condition.  Will get help right away if you are not doing well or get worse. Document Released: 01/22/2005 Document Revised: 04/16/2011 Document Reviewed: 06/21/2010 Exit[employer] Patient Information 2015 [employer]. This information   is not intended to replace advice given to you by your health care provider. Make sure you discuss any questions you have with your health care provider.  

## 2013-12-18 NOTE — Telephone Encounter (Signed)
Call from pt reporting nausea that is not relieved with Zofran. Headache and chills. Feels like she has a fever but does not have a thermometer at home. Requesting to be seen in office. Denies any changes at port a cath site. 5FU continuous infusion running. Reviewed with Dr. Benay Spice: OK to work in for [facility]. Instructed pt to come to office now.

## 2013-12-18 NOTE — Progress Notes (Signed)
Discharged at 12 with spouse to home, ambulatory in no distress.

## 2013-12-18 NOTE — Progress Notes (Signed)
Patient denies allergy to Tylenol.   1240: Called lab for lab draw and urine collection kit. 1321:  Lab collecting blood cultures.  Will now give tylenol as ordered. 1350: Asked needed to empty bladder.  "I will try."  Denies any trouble with hesitancy, frequency or burning.  Urine specimen obtained of clear dark amber urine.   1436: "I feel better than when I came in today".

## 2013-12-19 ENCOUNTER — Ambulatory Visit (HOSPITAL_BASED_OUTPATIENT_CLINIC_OR_DEPARTMENT_OTHER): Payer: No Typology Code available for payment source

## 2013-12-19 DIAGNOSIS — Z452 Encounter for adjustment and management of vascular access device: Secondary | ICD-10-CM

## 2013-12-19 DIAGNOSIS — C182 Malignant neoplasm of ascending colon: Secondary | ICD-10-CM

## 2013-12-19 LAB — URINE CULTURE

## 2013-12-19 MED ORDER — HEPARIN SOD (PORK) LOCK FLUSH 100 UNIT/ML IV SOLN
500.0000 [IU] | Freq: Once | INTRAVENOUS | Status: AC | PRN
  Administered 2013-12-19: 500 [IU]
  Filled 2013-12-19: qty 5

## 2013-12-19 MED ORDER — SODIUM CHLORIDE 0.9 % IJ SOLN
10.0000 mL | INTRAMUSCULAR | Status: DC | PRN
  Administered 2013-12-19: 10 mL
  Filled 2013-12-19: qty 10

## 2013-12-20 ENCOUNTER — Encounter: Payer: Self-pay | Admitting: Nurse Practitioner

## 2013-12-20 DIAGNOSIS — R110 Nausea: Secondary | ICD-10-CM | POA: Insufficient documentation

## 2013-12-20 NOTE — Assessment & Plan Note (Signed)
Patient initiated cycle 8, day 1 of her Folfox chemo yesterday 12/17/2013.  Her chemo pump is currently infusing as directed. She is due for cycle 9 of this same regimen on 01/04/2014.

## 2013-12-20 NOTE — Progress Notes (Signed)
SYMPTOM MANAGEMENT CLINIC   HPI: [name] 38 y.o. female diagnosed with colon cancer.  Currently undergoing Folfox chemotherapy regimen.   Patient called the cancer center today requesting an urgent care visit. She reports that she is allergic to eggs; and that eating eggs caused her to experience some nausea in the past. She tried [place] eggs on Sunday; and this again caused her some nausea. She then developed intermittent fevers, increased fatigue, body aches, and she now feels dehydrated.   She denis any diarrhea or UTI symptoms.   HPI  CURRENT THERAPY: Upcoming Treatment Dates - COLORECTAL FOLFOX q14d Days with orders from any treatment category:  01/04/2014      SCHEDULING COMMUNICATION      ondansetron (ZOFRAN) IVPB 8 mg      dexamethasone (DECADRON) injection 10 mg      oxaliplatin (ELOXATIN) 165 mg in dextrose 5 % 500 mL chemo infusion      leucovorin 772 mg in dextrose 5 % 250 mL infusion      fluorouracil (ADRUCIL) chemo injection 750 mg      fluorouracil (ADRUCIL) 4,650 mg in sodium chloride 0.9 % 150 mL chemo infusion      sodium chloride 0.9 % injection 10 mL      heparin lock flush 100 unit/mL      heparin lock flush 100 unit/mL      alteplase (CATHFLO ACTIVASE) injection 2 mg      sodium chloride 0.9 % injection 3 mL      Cold Pack 1 packet      Hot Pack 1 packet      dextrose 5 % solution      TREATMENT CONDITIONS 01/06/2014      SCHEDULING COMMUNICATION      sodium chloride 0.9 % injection 10 mL      heparin lock flush 100 unit/mL      heparin lock flush 100 unit/mL      alteplase (CATHFLO ACTIVASE) injection 2 mg      sodium chloride 0.9 % injection 3 mL      Cold Pack 1 packet    ROS  Past Medical History  Diagnosis Date  . GERD (gastroesophageal reflux disease)   . Seizures     last seizure 08/2012  . History of migraine     no problems in "a long time"  . Anemia, iron deficiency   . Restless leg syndrome   . Colon cancer 08/2013  .  History of blood transfusion 08/16/2013  . Difficulty swallowing pills     Past Surgical History  Procedure Laterality Date  . Esophagogastroduodenoscopy  07/07/2013  . Laparoscopic partial colectomy Right 08/11/2013    Procedure: LAPAROSCOPIC RIGHT HEMICOLECTOMY;  Surgeon: Stark Klein, MD;  Location: [facility];  Service: General;  Laterality: Right;  . Colonoscopy with propofol  07/07/2013  . Tubal ligation  11/07/2000  . Endometrial fulguration  11/01/2003  . Laparoscopic lysis of adhesions  11/01/2003  . Cystoscopy  11/01/2003  . Appendectomy  08/24/2004  . Abdominal hysterectomy  08/24/2004    partial  . Unilateral salpingectomy Left 08/24/2004  . Left oophorectomy  08/24/2004  . Panniculectomy  08/24/2004  . Portacath placement Left 09/09/2013    Procedure: INSERTION PORT-A-CATH;  Surgeon: Stark Klein, MD;  Location: [facility];  Service: General;  Laterality: Left;    has HYPERLIPIDEMIA; IRON DEFICIENCY; DEPRESSION/ANXIETY; RESTLESS LEG SYNDROME; GERD; Gastroparesis; IBS; FATTY LIVER DISEASE; OVARIAN CYST; TRANSAMINASES, SERUM, ELEVATED; ANA POSITIVE; LIBIDO,  DECREASED; Right wrist pain; ADD (attention deficit disorder); Fatigue; Hyperglycemia; Migraine; Medication reaction; Discoloration of skin of face; Primary generalized seizure disorder; Anemia, iron deficiency; Cancer of ascending colon s/p right colectomy 08/11/2013; Abdominal pain; Anemia due to antineoplastic chemotherapy; Chemotherapy induced thrombocytopenia; Hypomagnesemia; Hypophosphatemia; Adverse drug effect; Diarrhea; Malfunction of device; Weight loss; Mucositis (ulcerative) due to antineoplastic therapy; Fever; Dehydration; and Nausea on her problem list.     is allergic to eggs or egg-derived products; metoclopramide hcl; oxycodone-acetaminophen; sulfa antibiotics; and penicillins.    Medication List       This list is accurate as of: 12/18/13 11:59 PM.  Always use your most recent med list.                ferrous fumarate 325 (106 FE) MG Tabs tablet  Commonly known as:  HEMOCYTE  Take 1 tablet (106 mg of iron total) by mouth daily.     lamoTRIgine 100 MG tablet  Commonly known as:  LAMICTAL  Take 1 tablet (100 mg total) by mouth 2 (two) times daily.     levofloxacin 500 MG tablet  Commonly known as:  LEVAQUIN  Take 1 tablet (500 mg total) by mouth daily.     lidocaine-prilocaine cream  Commonly known as:  EMLA  Apply 1 application topically as needed. Apply 1 teaspoon to PAC site 2 hours prior to stick and cover with plastic wrap to numb site     LORazepam 1 MG tablet  Commonly known as:  ATIVAN  Take 1 tablet (1 mg total) by mouth 2 (two) times daily as needed for anxiety or sleep.     magic mouthwash w/lidocaine Soln  Take 5 mLs by mouth 4 (four) times daily as needed for mouth pain.     naproxen sodium 220 MG tablet  Commonly known as:  ANAPROX  Take 220 mg by mouth 2 (two) times daily as needed.     omeprazole 20 MG tablet  Commonly known as:  PRILOSEC OTC  Take 20 mg by mouth daily.     ondansetron 8 MG tablet  Commonly known as:  ZOFRAN  TAKE ONE TABLET BY MOUTH EVERY 8 HOURS AS NEEDED FOR NAUSEA OR VOMITING.     rOPINIRole 4 MG tablet  Commonly known as:  REQUIP  Take 1 tablet (4 mg total) by mouth at bedtime.         PHYSICAL EXAMINATION  Blood pressure 101/59, pulse 108, temperature 100.3 F (37.9 C), temperature source Oral, resp. rate 21, height 5\' 6"  (1.676 m), weight 173 lb 11.2 oz (78.79 kg), SpO2 99 %.  Physical Exam  Constitutional: She is oriented to person, place, and time and well-developed, well-nourished, and in no distress.  HENT:  Head: Normocephalic and atraumatic.  Right Ear: External ear normal.  Left Ear: External ear normal.  Mouth/Throat: Oropharynx is clear and moist.  Eyes: Conjunctivae and EOM are normal. Pupils are equal, round, and reactive to light. No scleral icterus.  Neck: Normal range of motion. Neck supple. No JVD present.  No tracheal deviation present. No thyromegaly present.  Cardiovascular: Normal rate, regular rhythm, normal heart sounds and intact distal pulses.   Pulmonary/Chest: Effort normal and breath sounds normal. No respiratory distress. She has no wheezes. She has no rales.  Abdominal: Soft. Bowel sounds are normal. She exhibits no distension and no mass. There is no tenderness. There is no rebound and no guarding.  Musculoskeletal: Normal range of motion. She exhibits no edema or tenderness.  Lymphadenopathy:  She has no cervical adenopathy.  Neurological: She is alert and oriented to person, place, and time. Gait normal.  Skin: Skin is warm and dry. No rash noted. No erythema.  Psychiatric: Affect normal.  Nursing note and vitals reviewed.   LABORATORY DATA:. Appointment on 12/18/2013  Component Date Value Ref Range Status  . Glucose 12/18/2013 100  Negative mg/dL Final  . Bilirubin (Urine) 12/18/2013 Negative  Negative Final  . Ketones 12/18/2013 Negative  Negative mg/dL Final  . Specific Gravity, Urine 12/18/2013 1.030  1.003 - 1.035 Final  . Blood 12/18/2013 Small  Negative Final  . pH 12/18/2013 5.0  4.6 - 8.0 Final  . Protein 12/18/2013 30  Negative- <30 mg/dL Final  . Urobilinogen, UR 12/18/2013 0.2  0.2 - 1 mg/dL Final  . Nitrite 12/18/2013 Negative  Negative Final  . Leukocyte Esterase 12/18/2013 Negative  Negative Final  . RBC / HPF 12/18/2013 0-2  0 - 2 Final  . WBC, UA 12/18/2013 0-2  0 - 2 Final  . Bacteria, UA 12/18/2013 Few  Negative- Trace Final  . Epithelial Cells 12/18/2013 Many  Negative- Few Final  . Amorphous, UA 12/18/2013 Large  Negative- Small Final  . Urine Culture, Routine 12/18/2013 Culture, Urine   Final   Comment: Final - ===== COLONY COUNT: ===== NO GROWTH NO GROWTH   Appointment on 12/17/2013  Component Date Value Ref Range Status  . WBC 12/17/2013 5.8  3.9 - 10.3 10e3/uL Final  . NEUT# 12/17/2013 4.1  1.5 - 6.5 10e3/uL Final  . HGB 12/17/2013 13.3   11.6 - 15.9 g/dL Final  . HCT 12/17/2013 41.6  34.8 - 46.6 % Final  . Platelets 12/17/2013 95* 145 - 400 10e3/uL Final  . MCV 12/17/2013 84.3  79.5 - 101.0 fL Final  . MCH 12/17/2013 26.8  25.1 - 34.0 pg Final  . MCHC 12/17/2013 31.8  31.5 - 36.0 g/dL Final  . RBC 12/17/2013 4.94  3.70 - 5.45 10e6/uL Final  . RDW 12/17/2013 19.1* 11.2 - 14.5 % Final  . lymph# 12/17/2013 1.2  0.9 - 3.3 10e3/uL Final  . MONO# 12/17/2013 0.5  0.1 - 0.9 10e3/uL Final  . Eosinophils Absolute 12/17/2013 0.0  0.0 - 0.5 10e3/uL Final  . Basophils Absolute 12/17/2013 0.0  0.0 - 0.1 10e3/uL Final  . NEUT% 12/17/2013 70.2  38.4 - 76.8 % Final  . LYMPH% 12/17/2013 20.7  14.0 - 49.7 % Final  . MONO% 12/17/2013 8.3  0.0 - 14.0 % Final  . EOS% 12/17/2013 0.5  0.0 - 7.0 % Final  . BASO% 12/17/2013 0.3  0.0 - 2.0 % Final  . Sodium 12/17/2013 137  136 - 145 mEq/L Final  . Potassium 12/17/2013 4.2  3.5 - 5.1 mEq/L Final  . Chloride 12/17/2013 105  98 - 109 mEq/L Final  . CO2 12/17/2013 27  22 - 29 mEq/L Final  . Glucose 12/17/2013 157* 70 - 140 mg/dl Final  . BUN 12/17/2013 10.6  7.0 - 26.0 mg/dL Final  . Creatinine 12/17/2013 0.9  0.6 - 1.1 mg/dL Final  . Total Bilirubin 12/17/2013 1.12  0.20 - 1.20 mg/dL Final  . Alkaline Phosphatase 12/17/2013 95  40 - 150 U/L Final  . AST 12/17/2013 26  5 - 34 U/L Final  . ALT 12/17/2013 20  0 - 55 U/L Final  . Total Protein 12/17/2013 7.2  6.4 - 8.3 g/dL Final  . Albumin 12/17/2013 3.7  3.5 - 5.0 g/dL Final  . Calcium 12/17/2013 9.9  8.4 - 10.4 mg/dL Final  . Anion Gap 12/17/2013 6  3 - 11 mEq/L Final     RADIOGRAPHIC STUDIES: No results found.  ASSESSMENT/PLAN:        Problem List as of 12/18/2013      High   Cancer of ascending colon s/p right colectomy 08/11/2013   Last Assessment & Plan   12/18/2013 Office Visit Written 12/20/2013  8:48 PM by Drue Second, NP    Patient initiated cycle 8, day 1 of her Folfox chemo yesterday 12/17/2013.  Her chemo pump is currently  infusing as directed. She is due for cycle 9 of this same regimen on 01/04/2014.     Fever   Last Assessment & Plan   12/18/2013 Office Visit Edited 12/20/2013  8:53 PM by Drue Second, NP    Patient initially noted to have temp of 100.3 on initial exam.  Fever most likely due to a viral infection; but will cover patient with Levaquin antibiotic since oncoming weekend.  Blood cultures drawn; and patient then given Tylenol PO.  Temp decreased to within normal range. UA negative; but pending urine culture.  Also pending blood culture results.     Dehydration   Last Assessment & Plan   12/18/2013 Office Visit Written 12/20/2013  8:48 PM by Drue Second, NP    Patient does appear fairly dehydrated today; and will receive 1 liter NS IV fluid rehydration today. She was also encouraged to push fluids at home.                                                                                                                                                                                                                                                                     Nausea: Nausea with no vomiting- Patient given Zofran IV while in Infusion area. Patient already has antiemetics at home.    Patient stated understanding of all instructions; and was in agreement with this plan of care. The patient knows to call the clinic with any problems, questions or concerns.   Review/collaboration with Dr. Benay Spice regarding all aspects of patient's visit today.   Total time spent with patient was 25 minutes;  with greater than 75 percent of that time spent in face to face  counseling regarding her symptoms,  and coordination of care and follow up.  Disclaimer: This note was dictated with voice recognition software. Similar sounding words can inadvertently be transcribed and may not be corrected upon review.   Drue Second,  NP 12/20/2013

## 2013-12-20 NOTE — Assessment & Plan Note (Signed)
Patient does appear fairly dehydrated today; and will receive 1 liter NS IV fluid rehydration today. She was also encouraged to push fluids at home.

## 2013-12-20 NOTE — Assessment & Plan Note (Signed)
Nausea with no vomiting- Patient given Zofran IV while in Infusion area. Patient already has antiemetics at home.

## 2013-12-20 NOTE — Assessment & Plan Note (Addendum)
Patient initially noted to have temp of 100.3 on initial exam.  Fever most likely due to a viral infection; but will cover patient with Levaquin antibiotic since oncoming weekend.  Blood cultures drawn; and patient then given Tylenol PO.  Temp decreased to within normal range. UA negative; but pending urine culture.  Also pending blood culture results.

## 2013-12-23 ENCOUNTER — Other Ambulatory Visit: Payer: Self-pay | Admitting: Family

## 2013-12-23 DIAGNOSIS — R928 Other abnormal and inconclusive findings on diagnostic imaging of breast: Secondary | ICD-10-CM

## 2013-12-24 ENCOUNTER — Telehealth: Payer: Self-pay | Admitting: *Deleted

## 2013-12-24 NOTE — Telephone Encounter (Signed)
Called patient to follow-up from office visit with Selena Lesser. Patient states that she feels much better, eating and drinking ok, no complaints. Patient advised to call if any further problems. Patient verbalized understanding.

## 2013-12-25 ENCOUNTER — Ambulatory Visit
Admission: RE | Admit: 2013-12-25 | Discharge: 2013-12-25 | Disposition: A | Discharge status: Home / Self Care | Payer: Self-pay | Source: Ambulatory Visit | Attending: Family | Admitting: Family

## 2013-12-25 ENCOUNTER — Ambulatory Visit
Admission: RE | Admit: 2013-12-25 | Discharge: 2013-12-25 | Disposition: A | Discharge status: Home / Self Care | Payer: No Typology Code available for payment source | Source: Ambulatory Visit | Attending: Family | Admitting: Family

## 2013-12-25 DIAGNOSIS — R928 Other abnormal and inconclusive findings on diagnostic imaging of breast: Secondary | ICD-10-CM

## 2013-12-26 LAB — CULTURE, BLOOD (SINGLE)

## 2013-12-28 ENCOUNTER — Ambulatory Visit (INDEPENDENT_AMBULATORY_CARE_PROVIDER_SITE_OTHER): Payer: No Typology Code available for payment source | Admitting: Psychology

## 2013-12-28 DIAGNOSIS — F332 Major depressive disorder, recurrent severe without psychotic features: Secondary | ICD-10-CM

## 2014-01-01 ENCOUNTER — Other Ambulatory Visit: Payer: Self-pay | Admitting: Oncology

## 2014-01-04 ENCOUNTER — Other Ambulatory Visit (HOSPITAL_BASED_OUTPATIENT_CLINIC_OR_DEPARTMENT_OTHER): Payer: No Typology Code available for payment source

## 2014-01-04 ENCOUNTER — Ambulatory Visit (HOSPITAL_BASED_OUTPATIENT_CLINIC_OR_DEPARTMENT_OTHER): Payer: No Typology Code available for payment source | Admitting: Oncology

## 2014-01-04 ENCOUNTER — Telehealth: Payer: Self-pay | Admitting: Oncology

## 2014-01-04 ENCOUNTER — Ambulatory Visit (HOSPITAL_BASED_OUTPATIENT_CLINIC_OR_DEPARTMENT_OTHER): Payer: No Typology Code available for payment source

## 2014-01-04 VITALS — BP 120/78 | HR 82 | Temp 98.4°F | Resp 18 | Ht 66.0 in | Wt 171.9 lb

## 2014-01-04 DIAGNOSIS — C182 Malignant neoplasm of ascending colon: Secondary | ICD-10-CM

## 2014-01-04 DIAGNOSIS — Z5111 Encounter for antineoplastic chemotherapy: Secondary | ICD-10-CM

## 2014-01-04 DIAGNOSIS — D701 Agranulocytosis secondary to cancer chemotherapy: Secondary | ICD-10-CM

## 2014-01-04 DIAGNOSIS — D509 Iron deficiency anemia, unspecified: Secondary | ICD-10-CM

## 2014-01-04 LAB — CBC WITH DIFFERENTIAL/PLATELET
BASO%: 2 % (ref 0.0–2.0)
Basophils Absolute: 0 10*3/uL (ref 0.0–0.1)
EOS%: 1 % (ref 0.0–7.0)
Eosinophils Absolute: 0 10*3/uL (ref 0.0–0.5)
HCT: 41.6 % (ref 34.8–46.6)
HGB: 13.3 g/dL (ref 11.6–15.9)
LYMPH%: 43.9 % (ref 14.0–49.7)
MCH: 27.5 pg (ref 25.1–34.0)
MCHC: 32 g/dL (ref 31.5–36.0)
MCV: 86 fL (ref 79.5–101.0)
MONO#: 0.3 10*3/uL (ref 0.1–0.9)
MONO%: 13.1 % (ref 0.0–14.0)
NEUT#: 0.8 10*3/uL — ABNORMAL LOW (ref 1.5–6.5)
NEUT%: 40 % (ref 38.4–76.8)
Platelets: 96 10*3/uL — ABNORMAL LOW (ref 145–400)
RBC: 4.84 10*6/uL (ref 3.70–5.45)
RDW: 17.6 % — ABNORMAL HIGH (ref 11.2–14.5)
WBC: 2 10*3/uL — ABNORMAL LOW (ref 3.9–10.3)
lymph#: 0.9 10*3/uL (ref 0.9–3.3)

## 2014-01-04 LAB — COMPREHENSIVE METABOLIC PANEL (CC13)
ALT: 17 U/L (ref 0–55)
ANION GAP: 8 meq/L (ref 3–11)
AST: 28 U/L (ref 5–34)
Albumin: 3.6 g/dL (ref 3.5–5.0)
Alkaline Phosphatase: 98 U/L (ref 40–150)
BUN: 10.4 mg/dL (ref 7.0–26.0)
CO2: 26 meq/L (ref 22–29)
CREATININE: 0.8 mg/dL (ref 0.6–1.1)
Calcium: 9.7 mg/dL (ref 8.4–10.4)
Chloride: 105 mEq/L (ref 98–109)
Glucose: 143 mg/dl — ABNORMAL HIGH (ref 70–140)
Potassium: 3.9 mEq/L (ref 3.5–5.1)
SODIUM: 139 meq/L (ref 136–145)
TOTAL PROTEIN: 7 g/dL (ref 6.4–8.3)
Total Bilirubin: 1.17 mg/dL (ref 0.20–1.20)

## 2014-01-04 MED ORDER — DEXTROSE 5 % IV SOLN
Freq: Once | INTRAVENOUS | Status: AC
  Administered 2014-01-04: 11:00:00 via INTRAVENOUS

## 2014-01-04 MED ORDER — SODIUM CHLORIDE 0.9 % IV SOLN
2400.0000 mg/m2 | INTRAVENOUS | Status: DC
  Administered 2014-01-04: 4650 mg via INTRAVENOUS
  Filled 2014-01-04: qty 93

## 2014-01-04 MED ORDER — LEUCOVORIN CALCIUM INJECTION 350 MG
400.0000 mg/m2 | Freq: Once | INTRAVENOUS | Status: AC
  Administered 2014-01-04: 772 mg via INTRAVENOUS
  Filled 2014-01-04: qty 38.6

## 2014-01-04 MED ORDER — FLUOROURACIL CHEMO INJECTION 2.5 GM/50ML
400.0000 mg/m2 | Freq: Once | INTRAVENOUS | Status: AC
  Administered 2014-01-04: 750 mg via INTRAVENOUS
  Filled 2014-01-04: qty 15

## 2014-01-04 NOTE — Patient Instructions (Signed)
Pegfilgrastim injection What is this medicine? PEGFILGRASTIM (peg fil GRA stim) is a long-acting granulocyte colony-stimulating factor that stimulates the growth of neutrophils, a type of white blood cell important in the body's fight against infection. It is used to reduce the incidence of fever and infection in patients with certain types of cancer who are receiving chemotherapy that affects the bone marrow. This medicine may be used for other purposes; ask your health care provider or pharmacist if you have questions. COMMON BRAND NAME(S): Neulasta What should I tell my health care provider before I take this medicine? They need to know if you have any of these conditions: -latex allergy -ongoing radiation therapy -sickle cell disease -skin reactions to acrylic adhesives (On-Body Injector only) -an unusual or allergic reaction to pegfilgrastim, filgrastim, other medicines, foods, dyes, or preservatives -pregnant or trying to get pregnant -breast-feeding How should I use this medicine? This medicine is for injection under the skin. If you get this medicine at home, you will be taught how to prepare and give the pre-filled syringe or how to use the On-body Injector. Refer to the patient Instructions for Use for detailed instructions. Use exactly as directed. Take your medicine at regular intervals. Do not take your medicine more often than directed. It is important that you put your used needles and syringes in a special sharps container. Do not put them in a trash can. If you do not have a sharps container, call your pharmacist or healthcare provider to get one. Talk to your pediatrician regarding the use of this medicine in children. Special care may be needed. Overdosage: If you think you have taken too much of this medicine contact a poison control center or emergency room at once. NOTE: This medicine is only for you. Do not share this medicine with others. What if I miss a dose? It is  important not to miss your dose. Call your doctor or health care professional if you miss your dose. If you miss a dose due to an On-body Injector failure or leakage, a new dose should be administered as soon as possible using a single prefilled syringe for manual use. What may interact with this medicine? Interactions have not been studied. Give your health care provider a list of all the medicines, herbs, non-prescription drugs, or dietary supplements you use. Also tell them if you smoke, drink alcohol, or use illegal drugs. Some items may interact with your medicine. This list may not describe all possible interactions. Give your health care provider a list of all the medicines, herbs, non-prescription drugs, or dietary supplements you use. Also tell them if you smoke, drink alcohol, or use illegal drugs. Some items may interact with your medicine. What should I watch for while using this medicine? You may need blood work done while you are taking this medicine. If you are going to need a MRI, CT scan, or other procedure, tell your doctor that you are using this medicine (On-Body Injector only). What side effects may I notice from receiving this medicine? Side effects that you should report to your doctor or health care professional as soon as possible: -allergic reactions like skin rash, itching or hives, swelling of the face, lips, or tongue -dizziness -fever -pain, redness, or irritation at site where injected -pinpoint red spots on the skin -shortness of breath or breathing problems -stomach or side pain, or pain at the shoulder -swelling -tiredness -trouble passing urine Side effects that usually do not require medical attention (report to your doctor   or health care professional if they continue or are bothersome): -bone pain    Starting day 1 of Neulasta, may take Claritin tablet daily X 5 days (can help decrease bone pain) -muscle pain This list may not describe all possible side  effects. Call your doctor for medical advice about side effects. You may report side effects to FDA at 1-800-FDA-1088. Where should I keep my medicine? Keep out of the reach of children. Store pre-filled syringes in a refrigerator between 2 and 8 degrees C (36 and 46 degrees F). Do not freeze. Keep in carton to protect from light. Throw away this medicine if it is left out of the refrigerator for more than 48 hours. Throw away any unused medicine after the expiration date. NOTE: This sheet is a summary. It may not cover all possible information. If you have questions about this medicine, talk to your doctor, pharmacist, or health care provider.  2015, [employer]. (2013-04-23 16:14:05)

## 2014-01-04 NOTE — Progress Notes (Signed)
  [place] OFFICE PROGRESS NOTE   Diagnosis: Colon cancer  INTERVAL HISTORY:   She returns as scheduled. She completed another cycle of FOLFOX on 12/17/2013. She tolerated chemotherapy well. No nausea/vomiting or diarrhea. Cold sensitivity lasted for 3 days following this cycle of chemotherapy. No neuropathy symptoms at present. She was seen in the office 12/18/2013 with a low-grade fever and body aches.  Objective:  Vital signs in last 24 hours:  Blood pressure 120/78, pulse 82, temperature 98.4 F (36.9 C), temperature source Oral, resp. rate 18, height _0  (1.676 m), weight 171 lb 14.4 oz (77.973 kg), SpO2 100 %.    HEENT: No thrush or ulcers Resp: Lungs clear bilaterally Cardio: Regular rate and rhythm GI: No hepatomegaly, nontender Vascular: No leg edema  Skin: Hyperpigmentation in the axillae,, mild hyperpigmentation of the hands   Portacath/PICC-without erythema  Lab Results:  Lab Results  Component Value Date   WBC 2.0* 01/04/2014   HGB 13.3 01/04/2014   HCT 41.6 01/04/2014   MCV 86.0 01/04/2014   PLT 96* 01/04/2014   NEUTROABS 0.8* 01/04/2014     Medications: I have reviewed the patient's current medications.  Assessment/Plan: 1. 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  The tumor returned microsatellite stable with no loss of mismatch repair protein expression   Cycle 1 adjuvant FOLFOX 09/08/2013   Cycle 2 adjuvant FOLFOX 09/24/2013   Cycle 3 adjuvant FOLFOX 10/08/2013.   Cycle 4 adjuvant FOLFOX 10/22/2013.   Cycle 5 adjuvant FOLFOX 11/05/2013. Oxaliplatin held due to thrombocytopenia.  Cycle 6 FOLFOX 11/19/2013.  Cycle 7 FOLFOX 12/03/2013. Oxaliplatin held secondary to thrombocytopenia.  Cycle 8 FOLFOX 12/17/2013. 2. iron deficiency anemia  3. seizure disorder  4. history of depression  5. 4 mm right lower lobe  nodule on a staging chest CT 09/08/2013  6. Hospitalization 09/24/2013 through 09/26/2013 with fever and abdominal pain.  7. 09/24/2013 urine culture positive for coag negative staph.  8. Thrombocytopenia secondary to chemotherapy 11/05/2013. Oxaliplatin was held with treatment on 11/05/2013 and 12/03/2013. 9. Neutropenia secondary to chemotherapy-oxaliplatin will be held with cycle 9 FOLFOX and she will receive Neulasta support  Disposition:  She has completed 8 cycles of adjuvant therapy. She has moderate neutropenia today. She would like to keep chemotherapy on schedule. We decided to drop oxaliplatin from cycle 9. She will receive Neulasta. We reviewed the potential toxicities associated with Neulasta and she agrees to proceed. Ms. Durene Romans will contact us for a fever or signs of infection.  She will return for an office visit and the next cycle of chemotherapy on 01/21/2014.  Betsy Coder, MD  01/04/2014  10:12 AM

## 2014-01-04 NOTE — Progress Notes (Signed)
Per Dr. Benay Spice : OK to tx today-will hold Oxaliplatin and give Neulasta on day 3. Provided patient with information on Neulasta.

## 2014-01-04 NOTE — Telephone Encounter (Signed)
gv and printed appt sched and avs for pt for Dec adn Jan 2016....sed added tx....pt ok adn aware

## 2014-01-04 NOTE — Patient Instructions (Signed)
[facility] Discharge Instructions for Patients Receiving Chemotherapy  Today you received the following chemotherapy agents LEUCOVORIN, 5FU PUSH, 5FU PUMP  To help prevent nausea and vomiting after your treatment, we encourage you to take your nausea medication AS NEEDED.   If you develop nausea and vomiting that is not controlled by your nausea medication, call the clinic.   BELOW ARE SYMPTOMS THAT SHOULD BE REPORTED IMMEDIATELY:  *FEVER GREATER THAN 100.5 F  *CHILLS WITH OR WITHOUT FEVER  NAUSEA AND VOMITING THAT IS NOT CONTROLLED WITH YOUR NAUSEA MEDICATION  *UNUSUAL SHORTNESS OF BREATH  *UNUSUAL BRUISING OR BLEEDING  TENDERNESS IN MOUTH AND THROAT WITH OR WITHOUT PRESENCE OF ULCERS  *URINARY PROBLEMS  *BOWEL PROBLEMS  UNUSUAL RASH Items with * indicate a potential emergency and should be followed up as soon as possible.  Feel free to call the clinic you have any questions or concerns. The clinic phone number is [phone].

## 2014-01-06 ENCOUNTER — Ambulatory Visit (HOSPITAL_BASED_OUTPATIENT_CLINIC_OR_DEPARTMENT_OTHER): Payer: No Typology Code available for payment source

## 2014-01-06 DIAGNOSIS — Z5189 Encounter for other specified aftercare: Secondary | ICD-10-CM

## 2014-01-06 DIAGNOSIS — C182 Malignant neoplasm of ascending colon: Secondary | ICD-10-CM

## 2014-01-06 MED ORDER — HEPARIN SOD (PORK) LOCK FLUSH 100 UNIT/ML IV SOLN
500.0000 [IU] | Freq: Once | INTRAVENOUS | Status: AC | PRN
  Administered 2014-01-06: 500 [IU]
  Filled 2014-01-06: qty 5

## 2014-01-06 MED ORDER — PEGFILGRASTIM INJECTION 6 MG/0.6ML ~~LOC~~
6.0000 mg | PREFILLED_SYRINGE | Freq: Once | SUBCUTANEOUS | Status: AC
  Administered 2014-01-06: 6 mg via SUBCUTANEOUS
  Filled 2014-01-06: qty 0.6

## 2014-01-06 MED ORDER — SODIUM CHLORIDE 0.9 % IJ SOLN
10.0000 mL | INTRAMUSCULAR | Status: DC | PRN
  Administered 2014-01-06: 10 mL
  Filled 2014-01-06: qty 10

## 2014-01-06 NOTE — Patient Instructions (Signed)
Pegfilgrastim injection What is this medicine? PEGFILGRASTIM (peg fil GRA stim) is a long-acting granulocyte colony-stimulating factor that stimulates the growth of neutrophils, a type of white blood cell important in the body's fight against infection. It is used to reduce the incidence of fever and infection in patients with certain types of cancer who are receiving chemotherapy that affects the bone marrow. This medicine may be used for other purposes; ask your health care provider or pharmacist if you have questions. COMMON BRAND NAME(S): Neulasta What should I tell my health care provider before I take this medicine? They need to know if you have any of these conditions: -latex allergy -ongoing radiation therapy -sickle cell disease -skin reactions to acrylic adhesives (On-Body Injector only) -an unusual or allergic reaction to pegfilgrastim, filgrastim, other medicines, foods, dyes, or preservatives -pregnant or trying to get pregnant -breast-feeding How should I use this medicine? This medicine is for injection under the skin. If you get this medicine at home, you will be taught how to prepare and give the pre-filled syringe or how to use the On-body Injector. Refer to the patient Instructions for Use for detailed instructions. Use exactly as directed. Take your medicine at regular intervals. Do not take your medicine more often than directed. It is important that you put your used needles and syringes in a special sharps container. Do not put them in a trash can. If you do not have a sharps container, call your pharmacist or healthcare provider to get one. Talk to your pediatrician regarding the use of this medicine in children. Special care may be needed. Overdosage: If you think you have taken too much of this medicine contact a poison control center or emergency room at once. NOTE: This medicine is only for you. Do not share this medicine with others. What if I miss a dose? It is  important not to miss your dose. Call your doctor or health care professional if you miss your dose. If you miss a dose due to an On-body Injector failure or leakage, a new dose should be administered as soon as possible using a single prefilled syringe for manual use. What may interact with this medicine? Interactions have not been studied. Give your health care provider a list of all the medicines, herbs, non-prescription drugs, or dietary supplements you use. Also tell them if you smoke, drink alcohol, or use illegal drugs. Some items may interact with your medicine. This list may not describe all possible interactions. Give your health care provider a list of all the medicines, herbs, non-prescription drugs, or dietary supplements you use. Also tell them if you smoke, drink alcohol, or use illegal drugs. Some items may interact with your medicine. What should I watch for while using this medicine? You may need blood work done while you are taking this medicine. If you are going to need a MRI, CT scan, or other procedure, tell your doctor that you are using this medicine (On-Body Injector only). What side effects may I notice from receiving this medicine? Side effects that you should report to your doctor or health care professional as soon as possible: -allergic reactions like skin rash, itching or hives, swelling of the face, lips, or tongue -dizziness -fever -pain, redness, or irritation at site where injected -pinpoint red spots on the skin -shortness of breath or breathing problems -stomach or side pain, or pain at the shoulder -swelling -tiredness -trouble passing urine Side effects that usually do not require medical attention (report to your doctor   or health care professional if they continue or are bothersome): -bone pain -muscle pain This list may not describe all possible side effects. Call your doctor for medical advice about side effects. You may report side effects to FDA at  1-800-FDA-1088. Where should I keep my medicine? Keep out of the reach of children. Store pre-filled syringes in a refrigerator between 2 and 8 degrees C (36 and 46 degrees F). Do not freeze. Keep in carton to protect from light. Throw away this medicine if it is left out of the refrigerator for more than 48 hours. Throw away any unused medicine after the expiration date. NOTE: This sheet is a summary. It may not cover all possible information. If you have questions about this medicine, talk to your doctor, pharmacist, or health care provider.  2015, Elsevier/Gold Standard. (2013-04-23 16:14:05) Fluorouracil, 5FU; Diclofenac topical cream What is this medicine? FLUOROURACIL; DICLOFENAC (flure oh YOOR a sil; dye KLOE fen ak) is a combination of a topical chemotherapy agent and non-steroidal anti-inflammatory drug (NSAID). It is used on the skin to treat skin cancer and skin conditions that could become cancer. This medicine may be used for other purposes; ask your health care provider or pharmacist if you have questions. COMMON BRAND NAME(S): FLUORAC What should I tell my health care provider before I take this medicine? They need to know if you have any of these conditions: -bleeding problems -cigarette smoker -DPD enzyme deficiency -heart disease -high blood pressure -if you frequently drink alcohol containing drinks -kidney disease -liver disease -open or infected skin -stomach problems -swelling or open sores at the treatment site -recent or planned coronary artery bypass graft (CABG) surgery -an unusual or allergic reaction to fluorouracil, diclofenac, aspirin, other NSAIDs, other medicines, foods, dyes, or preservatives -pregnant or trying to get pregnant -breast-feeding How should I use this medicine? This medicine is only for use on the skin. Follow the directions on the prescription label. Wash hands before and after use. Wash affected area and gently pat dry. To apply this  medicine use a cotton-tipped applicator, or use gloves if applying with fingertips. If applied with unprotected fingertips, it is very important to wash your hands well after you apply this medicine. Avoid applying to the eyes, nose, or mouth. Apply enough medicine to cover the affected area. You can cover the area with a light gauze dressing, but do not use tight or air-tight dressings. Finish the full course prescribed by your doctor or health care professional, even if you think your condition is better. Do not stop taking except on the advice of your doctor or health care professional. Talk to your pediatrician regarding the use of this medicine in children. Special care may be needed. Overdosage: If you think you've taken too much of this medicine contact a poison control center or emergency room at once. Overdosage: If you think you have taken too much of this medicine contact a poison control center or emergency room at once. NOTE: This medicine is only for you. Do not share this medicine with others. What if I miss a dose? If you miss a dose, apply it as soon as you can. If it is almost time for your next dose, only use that dose. Do not apply extra doses. Contact your doctor or health care professional if you miss more than one dose. What may interact with this medicine? Interactions are not expected. Do not use any other skin products without telling your doctor or health care professional. This list   may not describe all possible interactions. Give your health care provider a list of all the medicines, herbs, non-prescription drugs, or dietary supplements you use. Also tell them if you smoke, drink alcohol, or use illegal drugs. Some items may interact with your medicine. What should I watch for while using this medicine? Visit your doctor or health care professional for checks on your progress. You will need to use this medicine for 2 to 6 weeks. This may be longer depending on the condition  being treated. You may not see full healing for another 1 to 2 months after you stop using the medicine. Treated areas of skin can look unsightly during and for several weeks after treatment with this medicine. This medicine can make you more sensitive to the sun. Keep out of the sun. If you cannot avoid being in the sun, wear protective clothing and use sunscreen. Do not use sun lamps or tanning beds/booths. Where should I keep my What side effects may I notice from receiving this medicine? Side effects that you should report to your doctor or health care professional as soon as possible: -allergic reactions like skin rash, itching or hives, swelling of the face, lips, or tongue -black or bloody stools, blood in the urine or vomit -blurred vision -chest pain -difficulty breathing or wheezing -redness, blistering, peeling or loosening of the skin, including inside the mouth -severe redness and swelling of normal skin -slurred speech or weakness on one side of the body -trouble passing urine or change in the amount of urine -unexplained weight gain or swelling -unusually weak or tired -yellowing of eyes or skin Side effects that usually do not require medical attention (Report these to your doctor or health care professional if they continue or are bothersome.): -increased sensitivity of the skin to sun and ultraviolet light -pain and burning of the affected area -scaling or swelling of the affected area -skin rash, itching of the affected area -tenderness This list may not describe all possible side effects. Call your doctor for medical advice about side effects. You may report side effects to FDA at 1-800-FDA-1088. Where should I keep my medicine? Keep out of the reach of children. Store at room temperature between 20 and 25 degrees C (68 and 77 degrees F). Throw away any unused medicine after the expiration date. NOTE: This sheet is a summary. It may not cover all possible information.  If you have questions about this medicine, talk to your doctor, pharmacist, or health care provider.  2015, Elsevier/Gold Standard. (2013-05-25 11:09:58)  

## 2014-01-08 ENCOUNTER — Telehealth: Payer: Self-pay | Admitting: *Deleted

## 2014-01-08 MED ORDER — FLUCONAZOLE 150 MG PO TABS: 150.0000 mg | ORAL_TABLET | Freq: Once | ORAL | Status: DC

## 2014-01-08 NOTE — Telephone Encounter (Signed)
Call from pt reporting vaginal itching and burning. Also reports white patches on her tongue. She is asking if she can take the antibiotic she has at home for this. Reviewed with Ned Card, NP: Order received for Diflucan 150 mg x1. Repeat in one week if symptoms are still present. Pt voiced understanding of these instructions.

## 2014-01-11 ENCOUNTER — Ambulatory Visit: Payer: No Typology Code available for payment source | Admitting: Psychology

## 2014-01-17 ENCOUNTER — Other Ambulatory Visit: Payer: Self-pay | Admitting: Oncology

## 2014-01-21 ENCOUNTER — Ambulatory Visit (HOSPITAL_BASED_OUTPATIENT_CLINIC_OR_DEPARTMENT_OTHER): Payer: No Typology Code available for payment source | Admitting: Nurse Practitioner

## 2014-01-21 ENCOUNTER — Other Ambulatory Visit (HOSPITAL_BASED_OUTPATIENT_CLINIC_OR_DEPARTMENT_OTHER): Payer: No Typology Code available for payment source

## 2014-01-21 ENCOUNTER — Ambulatory Visit (HOSPITAL_BASED_OUTPATIENT_CLINIC_OR_DEPARTMENT_OTHER): Payer: No Typology Code available for payment source

## 2014-01-21 VITALS — BP 105/66 | HR 78 | Temp 98.5°F | Resp 18 | Ht 66.0 in | Wt 171.5 lb

## 2014-01-21 DIAGNOSIS — D509 Iron deficiency anemia, unspecified: Secondary | ICD-10-CM

## 2014-01-21 DIAGNOSIS — C182 Malignant neoplasm of ascending colon: Secondary | ICD-10-CM

## 2014-01-21 DIAGNOSIS — D696 Thrombocytopenia, unspecified: Secondary | ICD-10-CM

## 2014-01-21 DIAGNOSIS — Z5111 Encounter for antineoplastic chemotherapy: Secondary | ICD-10-CM

## 2014-01-21 DIAGNOSIS — D702 Other drug-induced agranulocytosis: Secondary | ICD-10-CM

## 2014-01-21 LAB — CBC WITH DIFFERENTIAL/PLATELET
BASO%: 0.2 % (ref 0.0–2.0)
Basophils Absolute: 0 10*3/uL (ref 0.0–0.1)
EOS%: 0.6 % (ref 0.0–7.0)
Eosinophils Absolute: 0 10*3/uL (ref 0.0–0.5)
HEMATOCRIT: 39.3 % (ref 34.8–46.6)
HGB: 13 g/dL (ref 11.6–15.9)
LYMPH%: 20 % (ref 14.0–49.7)
MCH: 29 pg (ref 25.1–34.0)
MCHC: 33.1 g/dL (ref 31.5–36.0)
MCV: 87.7 fL (ref 79.5–101.0)
MONO#: 0.4 10*3/uL (ref 0.1–0.9)
MONO%: 7.8 % (ref 0.0–14.0)
NEUT#: 3.4 10*3/uL (ref 1.5–6.5)
NEUT%: 71.4 % (ref 38.4–76.8)
PLATELETS: 69 10*3/uL — AB (ref 145–400)
RBC: 4.48 10*6/uL (ref 3.70–5.45)
RDW: 17 % — ABNORMAL HIGH (ref 11.2–14.5)
WBC: 4.8 10*3/uL (ref 3.9–10.3)
lymph#: 1 10*3/uL (ref 0.9–3.3)

## 2014-01-21 LAB — COMPREHENSIVE METABOLIC PANEL (CC13)
ALT: 23 U/L (ref 0–55)
AST: 30 U/L (ref 5–34)
Albumin: 3.6 g/dL (ref 3.5–5.0)
Alkaline Phosphatase: 110 U/L (ref 40–150)
Anion Gap: 8 mEq/L (ref 3–11)
BUN: 10.3 mg/dL (ref 7.0–26.0)
CO2: 26 mEq/L (ref 22–29)
CREATININE: 0.8 mg/dL (ref 0.6–1.1)
Calcium: 9.6 mg/dL (ref 8.4–10.4)
Chloride: 104 mEq/L (ref 98–109)
EGFR: 89 mL/min/{1.73_m2} — ABNORMAL LOW (ref >90)
Glucose: 193 mg/dl — ABNORMAL HIGH (ref 70–140)
Potassium: 3.9 mEq/L (ref 3.5–5.1)
Sodium: 138 mEq/L (ref 136–145)
Total Bilirubin: 0.75 mg/dL (ref 0.20–1.20)
Total Protein: 6.9 g/dL (ref 6.4–8.3)

## 2014-01-21 MED ORDER — LEUCOVORIN CALCIUM INJECTION 350 MG
400.0000 mg/m2 | Freq: Once | INTRAVENOUS | Status: AC
  Administered 2014-01-21: 772 mg via INTRAVENOUS
  Filled 2014-01-21: qty 38.6

## 2014-01-21 MED ORDER — FLUOROURACIL CHEMO INJECTION 2.5 GM/50ML
400.0000 mg/m2 | Freq: Once | INTRAVENOUS | Status: AC
  Administered 2014-01-21: 750 mg via INTRAVENOUS
  Filled 2014-01-21: qty 15

## 2014-01-21 MED ORDER — SODIUM CHLORIDE 0.9 % IV SOLN
2400.0000 mg/m2 | INTRAVENOUS | Status: DC
  Administered 2014-01-21: 4650 mg via INTRAVENOUS
  Filled 2014-01-21: qty 93

## 2014-01-21 MED ORDER — SODIUM CHLORIDE 0.9 % IV SOLN
Freq: Once | INTRAVENOUS | Status: AC
  Administered 2014-01-21: 11:00:00 via INTRAVENOUS

## 2014-01-21 MED ORDER — LORAZEPAM 1 MG PO TABS: 1.0000 mg | ORAL_TABLET | Freq: Two times a day (BID) | ORAL | Status: DC | PRN

## 2014-01-21 NOTE — Patient Instructions (Signed)
[facility] Discharge Instructions for Patients Receiving Chemotherapy  Today you received the following chemotherapy agents: Leucovorin and 5FU   To help prevent nausea and vomiting after your treatment, we encourage you to take your nausea medication as prescribed.    If you develop nausea and vomiting that is not controlled by your nausea medication, call the clinic.   BELOW ARE SYMPTOMS THAT SHOULD BE REPORTED IMMEDIATELY:  *FEVER GREATER THAN 100.5 F  *CHILLS WITH OR WITHOUT FEVER  NAUSEA AND VOMITING THAT IS NOT CONTROLLED WITH YOUR NAUSEA MEDICATION  *UNUSUAL SHORTNESS OF BREATH  *UNUSUAL BRUISING OR BLEEDING  TENDERNESS IN MOUTH AND THROAT WITH OR WITHOUT PRESENCE OF ULCERS  *URINARY PROBLEMS  *BOWEL PROBLEMS  UNUSUAL RASH Items with * indicate a potential emergency and should be followed up as soon as possible.  Feel free to call the clinic you have any questions or concerns. The clinic phone number is [phone].    

## 2014-01-21 NOTE — Progress Notes (Signed)
  [facility] OFFICE PROGRESS NOTE   Diagnosis:  Colon cancer  INTERVAL HISTORY:   [name] returns as scheduled. She completed cycle 9 FOLFOX 01/04/2014. Oxaliplatin was held due to neutropenia. She denies nausea/vomiting. No mouth sores. No diarrhea. No numbness or tingling in her hands. She notes intermittent tingling of the toes. She has a good appetite. She denies pain.  Objective:  Vital signs in last 24 hours:  Blood pressure 105/66, pulse 78, temperature 98.5 F (36.9 C), temperature source Oral, resp. rate 18, height _0  (1.676 m), weight 171 lb 8 oz (77.792 kg), SpO2 98 %.    HEENT: no thrush or ulcers. Resp: lungs clear bilaterally. Cardio: regular rate and rhythm. GI: abdomen soft and nontender. No hepatomegaly. Vascular: no leg edema. Calves soft and nontender. Neuro: vibratory sense mildly decreased over the fingertips per tuning fork exam.  Skin: mild acne-like rash scattered over the back. Port-A-Cath without erythema.    Lab Results:  Lab Results  Component Value Date   WBC 4.8 01/21/2014   HGB 13.0 01/21/2014   HCT 39.3 01/21/2014   MCV 87.7 01/21/2014   PLT 69* 01/21/2014   NEUTROABS 3.4 01/21/2014    Imaging:  No results found.  Medications: I have reviewed the patient's current medications.  Assessment/Plan: 1. 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  The tumor returned microsatellite stable with no loss of mismatch repair protein expression   Cycle 1 adjuvant FOLFOX 09/08/2013   Cycle 2 adjuvant FOLFOX 09/24/2013   Cycle 3 adjuvant FOLFOX 10/08/2013.   Cycle 4 adjuvant FOLFOX 10/22/2013.   Cycle 5 adjuvant FOLFOX 11/05/2013. Oxaliplatin held due to thrombocytopenia.  Cycle 6 FOLFOX 11/19/2013.  Cycle 7 FOLFOX 12/03/2013. Oxaliplatin held secondary to thrombocytopenia.  Cycle 8 FOLFOX 12/17/2013.  Cycle 9  FOLFOX 01/04/2014. Oxaliplatin held secondary to neutropenia.   Cycle 10 FOLFOX 01/21/2014. Oxaliplatin held secondary to thrombocytopenia. 2. iron deficiency anemia  3. seizure disorder  4. history of depression  5. 4 mm right lower lobe nodule on a staging chest CT 09/08/2013  6. Hospitalization 09/24/2013 through 09/26/2013 with fever and abdominal pain.  7. 09/24/2013 urine culture positive for coag negative staph.  8. Thrombocytopenia secondary to chemotherapy 11/05/2013. Oxaliplatin was held with treatment on 11/05/2013 and 12/03/2013. 9. Neutropenia secondary to chemotherapy 01/04/2014-oxaliplatin was held with cycle 9 FOLFOX and she received Neulasta support.   Disposition: [name] appears stable. She has completed 9 cycles of FOLFOX. Plan to proceed with cycle 10 today as scheduled. Oxaliplatin will be held due to thrombocytopenia. She will return for a followup visit and cycle 11 in 2 weeks. She will contact the office in the interim with any problems. We specifically discussed bleeding.  Plan reviewed with Dr. Benay Spice.   Ned Card ANP/GNP-BC   01/21/2014  10:58 AM

## 2014-01-23 ENCOUNTER — Ambulatory Visit (HOSPITAL_BASED_OUTPATIENT_CLINIC_OR_DEPARTMENT_OTHER): Payer: No Typology Code available for payment source

## 2014-01-23 DIAGNOSIS — C182 Malignant neoplasm of ascending colon: Secondary | ICD-10-CM

## 2014-01-23 DIAGNOSIS — Z452 Encounter for adjustment and management of vascular access device: Secondary | ICD-10-CM

## 2014-01-23 MED ORDER — SODIUM CHLORIDE 0.9 % IJ SOLN
10.0000 mL | INTRAMUSCULAR | Status: DC | PRN
  Administered 2014-01-23: 10 mL
  Filled 2014-01-23: qty 10

## 2014-01-23 MED ORDER — HEPARIN SOD (PORK) LOCK FLUSH 100 UNIT/ML IV SOLN
500.0000 [IU] | Freq: Once | INTRAVENOUS | Status: AC | PRN
  Administered 2014-01-23: 500 [IU]
  Filled 2014-01-23: qty 5

## 2014-01-30 ENCOUNTER — Encounter (HOSPITAL_COMMUNITY): Payer: Self-pay | Admitting: Emergency Medicine

## 2014-01-30 ENCOUNTER — Emergency Department (HOSPITAL_COMMUNITY)
Admission: EM | Admit: 2014-01-30 | Discharge: 2014-01-31 | Disposition: A | Discharge status: Home / Self Care | Payer: No Typology Code available for payment source | Attending: Emergency Medicine | Admitting: Emergency Medicine

## 2014-01-30 ENCOUNTER — Emergency Department (HOSPITAL_COMMUNITY): Payer: No Typology Code available for payment source

## 2014-01-30 DIAGNOSIS — Z9049 Acquired absence of other specified parts of digestive tract: Secondary | ICD-10-CM | POA: Insufficient documentation

## 2014-01-30 DIAGNOSIS — D509 Iron deficiency anemia, unspecified: Secondary | ICD-10-CM | POA: Diagnosis not present

## 2014-01-30 DIAGNOSIS — Z79899 Other long term (current) drug therapy: Secondary | ICD-10-CM | POA: Diagnosis not present

## 2014-01-30 DIAGNOSIS — Z880 Allergy status to penicillin: Secondary | ICD-10-CM | POA: Insufficient documentation

## 2014-01-30 DIAGNOSIS — G40909 Epilepsy, unspecified, not intractable, without status epilepticus: Secondary | ICD-10-CM | POA: Insufficient documentation

## 2014-01-30 DIAGNOSIS — R109 Unspecified abdominal pain: Secondary | ICD-10-CM

## 2014-01-30 DIAGNOSIS — Z85038 Personal history of other malignant neoplasm of large intestine: Secondary | ICD-10-CM | POA: Insufficient documentation

## 2014-01-30 DIAGNOSIS — G2581 Restless legs syndrome: Secondary | ICD-10-CM | POA: Diagnosis not present

## 2014-01-30 DIAGNOSIS — L500 Allergic urticaria: Secondary | ICD-10-CM | POA: Diagnosis not present

## 2014-01-30 DIAGNOSIS — K219 Gastro-esophageal reflux disease without esophagitis: Secondary | ICD-10-CM | POA: Diagnosis not present

## 2014-01-30 DIAGNOSIS — Z3202 Encounter for pregnancy test, result negative: Secondary | ICD-10-CM | POA: Insufficient documentation

## 2014-01-30 DIAGNOSIS — G43909 Migraine, unspecified, not intractable, without status migrainosus: Secondary | ICD-10-CM | POA: Insufficient documentation

## 2014-01-30 DIAGNOSIS — T65891A Toxic effect of other specified substances, accidental (unintentional), initial encounter: Secondary | ICD-10-CM | POA: Diagnosis not present

## 2014-01-30 DIAGNOSIS — K529 Noninfective gastroenteritis and colitis, unspecified: Secondary | ICD-10-CM | POA: Insufficient documentation

## 2014-01-30 DIAGNOSIS — R1084 Generalized abdominal pain: Secondary | ICD-10-CM | POA: Diagnosis present

## 2014-01-30 LAB — CBC WITH DIFFERENTIAL/PLATELET
BASOS PCT: 0 % (ref 0–1)
Basophils Absolute: 0 10*3/uL (ref 0.0–0.1)
Eosinophils Absolute: 0.1 10*3/uL (ref 0.0–0.7)
Eosinophils Relative: 1 % (ref 0–5)
HCT: 42.9 % (ref 36.0–46.0)
HEMOGLOBIN: 14.5 g/dL (ref 12.0–15.0)
LYMPHS PCT: 15 % (ref 12–46)
Lymphs Abs: 1.1 10*3/uL (ref 0.7–4.0)
MCH: 29.8 pg (ref 26.0–34.0)
MCHC: 33.8 g/dL (ref 30.0–36.0)
MCV: 88.1 fL (ref 78.0–100.0)
MONOS PCT: 3 % (ref 3–12)
Monocytes Absolute: 0.2 10*3/uL (ref 0.1–1.0)
NEUTROS ABS: 5.8 10*3/uL (ref 1.7–7.7)
NEUTROS PCT: 81 % — AB (ref 43–77)
Platelets: 124 10*3/uL — ABNORMAL LOW (ref 150–400)
RBC: 4.87 MIL/uL (ref 3.87–5.11)
RDW: 16.6 % — ABNORMAL HIGH (ref 11.5–15.5)
WBC: 7.2 10*3/uL (ref 4.0–10.5)

## 2014-01-30 LAB — URINALYSIS, ROUTINE W REFLEX MICROSCOPIC
BILIRUBIN URINE: NEGATIVE
GLUCOSE, UA: NEGATIVE mg/dL
Hgb urine dipstick: NEGATIVE
Ketones, ur: NEGATIVE mg/dL
Leukocytes, UA: NEGATIVE
Nitrite: NEGATIVE
PH: 5 (ref 5.0–8.0)
Protein, ur: NEGATIVE mg/dL
Specific Gravity, Urine: >1.046 — ABNORMAL HIGH (ref 1.005–1.030)
Urobilinogen, UA: 0.2 mg/dL (ref 0.0–1.0)

## 2014-01-30 LAB — COMPREHENSIVE METABOLIC PANEL
ALT: 24 U/L (ref 0–35)
AST: 31 U/L (ref 0–37)
Albumin: 4.5 g/dL (ref 3.5–5.2)
Alkaline Phosphatase: 99 U/L (ref 39–117)
Anion gap: 7 (ref 5–15)
BILIRUBIN TOTAL: 1.5 mg/dL — AB (ref 0.3–1.2)
BUN: 16 mg/dL (ref 6–23)
CHLORIDE: 103 meq/L (ref 96–112)
CO2: 26 mmol/L (ref 19–32)
Calcium: 9.7 mg/dL (ref 8.4–10.5)
Creatinine, Ser: 0.72 mg/dL (ref 0.50–1.10)
GFR calc Af Amer: >90 mL/min (ref >90)
GFR calc non Af Amer: >90 mL/min (ref >90)
Glucose, Bld: 120 mg/dL — ABNORMAL HIGH (ref 70–99)
POTASSIUM: 3.5 mmol/L (ref 3.5–5.1)
SODIUM: 136 mmol/L (ref 135–145)
Total Protein: 8.2 g/dL (ref 6.0–8.3)

## 2014-01-30 LAB — PREGNANCY, URINE: Preg Test, Ur: NEGATIVE

## 2014-01-30 LAB — LIPASE, BLOOD: LIPASE: 43 U/L (ref 11–59)

## 2014-01-30 MED ORDER — ONDANSETRON HCL 4 MG/2ML IJ SOLN
4.0000 mg | Freq: Once | INTRAMUSCULAR | Status: AC
  Administered 2014-01-30: 4 mg via INTRAVENOUS
  Filled 2014-01-30: qty 2

## 2014-01-30 MED ORDER — IOHEXOL 300 MG/ML  SOLN
100.0000 mL | Freq: Once | INTRAMUSCULAR | Status: AC | PRN
  Administered 2014-01-30: 100 mL via INTRAVENOUS

## 2014-01-30 MED ORDER — DIPHENHYDRAMINE HCL 50 MG/ML IJ SOLN
50.0000 mg | Freq: Once | INTRAMUSCULAR | Status: AC
  Administered 2014-01-30: 50 mg via INTRAVENOUS

## 2014-01-30 MED ORDER — SODIUM CHLORIDE 0.9 % IV BOLUS (SEPSIS)
1000.0000 mL | Freq: Once | INTRAVENOUS | Status: AC
  Administered 2014-01-30: 1000 mL via INTRAVENOUS

## 2014-01-30 MED ORDER — DIPHENHYDRAMINE HCL 50 MG/ML IJ SOLN
INTRAMUSCULAR | Status: AC
  Filled 2014-01-30: qty 1

## 2014-01-30 MED ORDER — ONDANSETRON 4 MG PO TBDP: ORAL_TABLET | ORAL | Status: DC

## 2014-01-30 MED ORDER — METHYLPREDNISOLONE SODIUM SUCC 125 MG IJ SOLR
125.0000 mg | Freq: Once | INTRAMUSCULAR | Status: AC
  Administered 2014-01-30: 125 mg via INTRAVENOUS
  Filled 2014-01-30: qty 2

## 2014-01-30 NOTE — ED Notes (Signed)
Pt states that last night her abd wasn't feeling normal, with loose stools but today has turned into diarrhea and abd cramping.  Pt states that she started vomiting since she has been here x4.

## 2014-01-30 NOTE — Discharge Instructions (Signed)
Abdominal Pain Many things can cause abdominal pain. Usually, abdominal pain is not caused by a disease and will improve without treatment. It can often be observed and treated at home. Your health care provider will do a physical exam and possibly order blood tests and X-rays to help determine the seriousness of your pain. However, in many cases, more time must pass before a clear cause of the pain can be found. Before that point, your health care provider may not know if you need more testing or further treatment. HOME CARE INSTRUCTIONS  Monitor your abdominal pain for any changes. The following actions may help to alleviate any discomfort you are experiencing:  Only take over-the-counter or prescription medicines as directed by your health care provider.  Do not take laxatives unless directed to do so by your health care provider.  Try a clear liquid diet (broth, tea, or water) as directed by your health care provider. Slowly move to a bland diet as tolerated. SEEK MEDICAL CARE IF:  You have unexplained abdominal pain.  You have abdominal pain associated with nausea or diarrhea.  You have pain when you urinate or have a bowel movement.  You experience abdominal pain that wakes you in the night.  You have abdominal pain that is worsened or improved by eating food.  You have abdominal pain that is worsened with eating fatty foods.  You have a fever. SEEK IMMEDIATE MEDICAL CARE IF:   Your pain does not go away within 2 hours.  You keep throwing up (vomiting).  Your pain is felt only in portions of the abdomen, such as the right side or the left lower portion of the abdomen.  You pass bloody or black tarry stools. MAKE SURE YOU:  Understand these instructions.   Will watch your condition.   Will get help right away if you are not doing well or get worse.  Document Released: 11/01/2004 Document Revised: 01/27/2013 Document Reviewed: 10/01/2012 [facility] Patient Information  2015 [place], [place]. This information is not intended to replace advice given to you by your health care provider. Make sure you discuss any questions you have with your health care provider. Reao anafiltica  (Anaphylactic Reaction)  Uma reao anafiltica  uma reao alrgica repentina e severa que envolve todo o corpo. Ela pode ser fatal. Frankey Poot  necessria uma internao. Pessoas com asma, eczema ou febre do feno so ligeiramente mais provveis a ter uma reao anafiltica. CAUSAS  Uma reao anafiltica pode ser causada por qualquer coisa da qual voc  alrgico. Aps ser exposto a uma substncia alrgica, seu sistema imunolgico fica sensvel  ela. Quando voc se expe novamente a essa substncia, ocorre uma reao alrgica. Causas comuns de uma reao anafiltica incluem:   Medicamentos.  Alimentos, especialmente amendoim, trigo, marisco, leite e ovos.  Picadas ou ferroadas de insetos.  Produtos  base de sangue.  Produtos qumicos, como corantes, ltex e material de contraste utilizado para exames por imagem. SINTOMAS  Quando uma reao alrgica ocorre, o corpo libera histamina e outras substncias. Essas substncias provocam sintomas como [employer]. Os sintomas frequentemente se desenvolvem aps segundos ou minutos da exposio. Os sintomas podem incluir:   Erupes ou urticria na pele.  Coceira.  Aperto no peito.  [employer], lngua ou lbios.  Problemas ao respirar ou engolir.  Tontura ou desmaio.  Ansiedade ou confuso.  Dores estomacais, vmito ou diarreia.  Congesto nasal.  Um batimento cardaco rpido ou irregular (palpitaes). DIAGNSTICO  O diagnstico  baseado  em seu histrico de exposio recente a substncias alergnicas, seus sintomas e um exame fsico. Seu mdico tambm pode realizar exames de sangue ou urina para confirmar o diagnstico.  TRATAMENTO  A epinefrina  o tratamento principal para uma reao  anafiltica. Outros medicamentos que podem ser utilizados para o tratamento incluem antiestaminas, esteroides e albuterol. Em casos severos, podem ser ministrados intravenosamente (IV) lquidos e medicamentos para manter a presso sangunea. Mesmo se melhorar aps o tratamento, voc precisa ser observado para ter certeza que sua condio no piore. Isto pode exigir uma internao.  INSTRUES PARA TRATAMENTO DOMICILIAR   Use uma pulseira de alerta mdico ou colar indicando sua alergia.  Voc e sua famlia devem aprender [employer] kit de anafilaxia ou ministrar uma injeo de epinefrina para tratar temporariamente uma reao alrgica de emergncia. Carregue sempre sua injeo de epinefrina ou kit de anafilaxia com voc. Isso pode salvar sua vida se tiver uma reao severa.  No dirija ou realize tarefas aps o tratamento at que os medicamentos usados para tratar sua reao tenham sido absorvidos ou at [employer].  Se tiver urticria ou uma inflamao:  Tome medicamentos conforme as orientaes de seu mdico.  Voc pode usar uma anti estamina de venda livre (difenidramina) conforme necessrio.  Aplique compressas frias  pele ou tome banhos de gua fria. Evite banhos ou chuveiros quentes. PROCURE UM MDICO SE:   Desenvolver sintomas de uma reao alrgica a uma nova substncia. Os sintomas comearem imediatamente ou em alguns minutos.  Desenvolver erupes, urticria ou coceira.  Desenvolver novos sintomas. PROCURE UM MDICO IMEDIATAMENTE SE:   Tiver inchao na boca, dificuldade de respirar ou de engolir.  Tiver uma sensao de aperto no peito ou garganta.  Desenvolver urticria, inchao ou coceira por todo o corpo.  Desenvolver vmito ou diarreia severos.  Se sentir fraco ou prestes a desmaiar. Isso  uma emergncia. Use sua injeo de epinefrina ou kit de anafilaxia conforme tenha sido instrudo. Ligue para os servios de Engineer, building services (911 nos EUA).  Mesmo se melhorar aps a injeo, precisar ser examinado no departamento de emergncia do hospital.  CERTIFIQUE-SE DE:   Compreender estas instrues.  Observar as suas condies.  Procurar um mdico imediatamente se no se sentir bem ou piorar. Document Released: 01/22/2005 Document Revised: 01/27/2013 [facility] Patient Information 2015 [place]. This information is not intended to replace advice given to you by your health care provider. Make sure you discuss any questions you have with your health care provider.

## 2014-01-30 NOTE — ED Notes (Signed)
Patient began having an allergic reaction to tape possibly in the waiting area. Registration called nursing to lobby to assess the patient. She was presented with large raised areas to her face and both arms with itching. She also complained that she felt like her tongue was swelling. Patient was quickly moved to the back to be seen by a provider.

## 2014-01-30 NOTE — ED Notes (Signed)
MD at bedside. 

## 2014-01-30 NOTE — ED Provider Notes (Signed)
CSN: [id number]     Arrival date & time 01/30/14  1737 History   First MD Initiated Contact with Patient 01/30/14 1909     Chief Complaint  Patient presents with  . Diarrhea  . vomit   . Abdominal Cramping     (Consider location/radiation/quality/duration/timing/severity/associated sxs/prior Treatment) Patient is a 38 y.o. female presenting with abdominal pain.  Abdominal Pain Pain location:  Generalized Pain quality: cramping   Pain radiates to:  Does not radiate Pain severity:  Severe Onset quality:  Gradual Duration:  3 days Timing:  Constant Progression:  Worsening Chronicity:  New Context comment:  Ho colon cancer on chemotherapy Relieved by:  Nothing Worsened by:  Palpation and bowel movements Ineffective treatments:  None tried Associated symptoms: anorexia, diarrhea, nausea and vomiting   Associated symptoms: no chills, no dysuria and no fever     Past Medical History  Diagnosis Date  . GERD (gastroesophageal reflux disease)   . Seizures     last seizure 08/2012  . History of migraine     no problems in "a long time"  . Anemia, iron deficiency   . Restless leg syndrome   . Colon cancer 08/2013  . History of blood transfusion 08/16/2013  . Difficulty swallowing pills    Past Surgical History  Procedure Laterality Date  . Esophagogastroduodenoscopy  07/07/2013  . Laparoscopic partial colectomy Right 08/11/2013    Procedure: LAPAROSCOPIC RIGHT HEMICOLECTOMY;  Surgeon: Stark Klein, MD;  Location: [facility];  Service: General;  Laterality: Right;  . Colonoscopy with propofol  07/07/2013  . Tubal ligation  11/07/2000  . Endometrial fulguration  11/01/2003  . Laparoscopic lysis of adhesions  11/01/2003  . Cystoscopy  11/01/2003  . Appendectomy  08/24/2004  . Abdominal hysterectomy  08/24/2004    partial  . Unilateral salpingectomy Left 08/24/2004  . Left oophorectomy  08/24/2004  . Panniculectomy  08/24/2004  . Portacath placement Left 09/09/2013    Procedure: INSERTION  PORT-A-CATH;  Surgeon: Stark Klein, MD;  Location: [facility];  Service: General;  Laterality: Left;   Family History  Problem Relation Age of Onset  . Heart attack Mother   . Colon polyps Mother   . Colitis Mother   . Diabetes Father   . Heart attack Father   . Stroke Father   . Cirrhosis Maternal Aunt   . Cancer Maternal Uncle     unknown type  . Colon cancer Neg Hx   . Esophageal cancer Neg Hx   . Rectal cancer Neg Hx   . Stomach cancer Neg Hx    History  Substance Use Topics  . Smoking status: Never Smoker   . Smokeless tobacco: Never Used  . Alcohol Use: No   OB History    No data available     Review of Systems  Constitutional: Negative for fever and chills.  Gastrointestinal: Positive for nausea, vomiting, abdominal pain, diarrhea and anorexia.  Genitourinary: Negative for dysuria.  All other systems reviewed and are negative.     Allergies  Eggs or egg-derived products; Metoclopramide hcl; Oxycodone-acetaminophen; Sulfa antibiotics; Adhesive; and Penicillins  Home Medications   Prior to Admission medications   Medication Sig Start Date End Date Taking? Authorizing Provider  Alum & Mag Hydroxide-Simeth (MAGIC MOUTHWASH W/LIDOCAINE) SOLN Take 5 mLs by mouth 4 (four) times daily as needed for mouth pain. 10/28/13  Yes Drue Second, NP  ferrous fumarate (HEMOCYTE) 325 (106 FE) MG TABS tablet Take 1 tablet (106 mg of iron total)  by mouth daily. 09/03/13  Yes [name], MD  lamoTRIgine (LAMICTAL) 100 MG tablet Take 1 tablet (100 mg total) by mouth 2 (two) times daily. Patient taking differently: Take 200 mg by mouth every evening.  08/20/13  Yes Adam Melvern Sample, DO  lidocaine-prilocaine (EMLA) cream Apply 1 application topically as needed. Apply 1 teaspoon to [facility] site 2 hours prior to stick and cover with plastic wrap to numb site 09/04/13  Yes [name], MD  LORazepam (ATIVAN) 1 MG tablet Take 1 tablet (1 mg total) by mouth 2 (two) times  daily as needed for anxiety or sleep. 01/21/14  Yes Owens Shark, NP  omeprazole (PRILOSEC OTC) 20 MG tablet Take 20 mg by mouth daily.    Yes Historical Provider, MD  ondansetron (ZOFRAN) 8 MG tablet TAKE ONE TABLET BY MOUTH EVERY 8 HOURS AS NEEDED FOR NAUSEA OR VOMITING. 12/07/13  Yes [name], MD  rOPINIRole (REQUIP) 4 MG tablet Take 1 tablet (4 mg total) by mouth at bedtime. 06/17/13  Yes Adam Melvern Sample, DO  fluconazole (DIFLUCAN) 150 MG tablet Take 1 tablet (150 mg total) by mouth once. Repeat in one week if symptoms are still present. Patient not taking: Reported on 01/21/2014 01/08/14   Owens Shark, NP  levofloxacin (LEVAQUIN) 500 MG tablet Take 1 tablet (500 mg total) by mouth daily. Patient not taking: Reported on 01/04/2014 12/18/13   Drue Second, NP  ondansetron (ZOFRAN ODT) 4 MG disintegrating tablet 4mg  ODT q4 hours prn nausea/vomit 01/30/14   Debby Freiberg, MD   BP 118/77 mmHg  Pulse 94  Temp(Src) 99.1 F (37.3 C) (Oral)  Resp 18  SpO2 98% Physical Exam  Constitutional: She is oriented to person, place, and time. She appears well-developed and well-nourished.  HENT:  Head: Normocephalic and atraumatic.  Right Ear: External ear normal.  Left Ear: External ear normal.  Eyes: Conjunctivae and EOM are normal. Pupils are equal, round, and reactive to light.  Neck: Normal range of motion. Neck supple.  Cardiovascular: Normal rate, regular rhythm, normal heart sounds and intact distal pulses.   Pulmonary/Chest: Effort normal and breath sounds normal.  Abdominal: Soft. Bowel sounds are normal. There is no tenderness.  Musculoskeletal: Normal range of motion.  Neurological: She is alert and oriented to person, place, and time.  Skin: Skin is warm and dry.  Vitals reviewed.   ED Course  Procedures (including critical care time) Labs Review Labs Reviewed  CBC WITH DIFFERENTIAL - Abnormal; Notable for the following:    RDW 16.6 (*)    Platelets 124 (*)     Neutrophils Relative % 81 (*)    All other components within normal limits  COMPREHENSIVE METABOLIC PANEL - Abnormal; Notable for the following:    Glucose, Bld 120 (*)    Total Bilirubin 1.5 (*)    All other components within normal limits  URINALYSIS, ROUTINE W REFLEX MICROSCOPIC - Abnormal; Notable for the following:    Specific Gravity, Urine >1.046 (*)    All other components within normal limits  LIPASE, BLOOD  PREGNANCY, URINE    Imaging Review Ct Abdomen Pelvis W Contrast  01/30/2014   CLINICAL DATA:  Diarrhea, abdominal cramping, vomiting. History of colon cancer.  EXAM: CT ABDOMEN AND PELVIS WITH CONTRAST  TECHNIQUE: Multidetector CT imaging of the abdomen and pelvis was performed using the standard protocol following bolus administration of intravenous contrast.  CONTRAST:  134mL OMNIPAQUE IOHEXOL 300 MG/ML  SOLN  COMPARISON:  07/21/2013  FINDINGS: Lung bases are clear.  No effusions.  Heart is normal size.  Liver, gallbladder, pancreas, adrenals and kidneys are normal. There is splenomegaly. The craniocaudal length of the spleen measures 17.4 cm compared with 15.4 cm previously. No focal lesions.  Changes of right hemicolectomy. Previously seen enlarged right mesenteric lymph nodes are no longer visualized. No adenopathy currently. Trace free fluid in the pelvis, presumably physiologic. Right ovary is prominent but is stable compared to prior study. Prior hysterectomy. Urinary bladder is unremarkable.  Stomach and small bowel are decompressed, unremarkable. No evidence of bowel obstruction.  No acute bony abnormality or focal bone lesion.  IMPRESSION: Prior right hemicolectomy. No evidence of recurrent or metastatic disease in the abdomen or pelvis.  Increasing splenomegaly.   Electronically Signed   By: Rolm Baptise M.D.   On: 01/30/2014 21:08     EKG Interpretation None      MDM   Final diagnoses:  Abdominal pain  Gastroenteritis    38 y.o. female with pertinent PMH of  colon cancer sp colectomy and chemotherapy (just finished chemotherapy) presents with abdominal pain, cramping, nausea, vomiting, diarrhea. On arrival blood drawn per protocol. Patient developed urticarial rash site of adhesive tape. No history of prior allergies to tape. She was given Benadryl and Solu-Medrol with relief of symptoms. Normal saline bolus given. Patient in no pain on my exam. Exam as above with urticarial rash, very mild generalized abdominal tenderness.  CT scan obtained as above and unremarkable. Normal saline bolus given. Zofran  given. Patient well-appearing, afebrile. Given that she has no fever, unremarkable labs, unremarkable imaging consider discharge reasonable. Likely etiology viral gastritis. Discharged home in stable condition.  I have reviewed all laboratory and imaging studies if ordered as above  1. Gastroenteritis   2. Abdominal pain         Debby Freiberg, MD 01/31/14 1452

## 2014-01-30 NOTE — ED Notes (Signed)
Patient transported to CT 

## 2014-01-30 NOTE — ED Notes (Signed)
Pt ambulated to the bathroom and forgot to give staff a urine sample.

## 2014-01-30 NOTE — ED Notes (Signed)
I called patient name to draw blood and got no answer

## 2014-01-30 NOTE — ED Notes (Signed)
Bed: TK18 Expected date:  Expected time:  Means of arrival:  Comments: Pt still in room

## 2014-01-30 NOTE — ED Notes (Signed)
Patient with allergic reaction due to adhesive in plastic tape that was used in triage Patient denies eating/drinking anything prior to coming to ED that may have attributed to reaction Patient's face, chest and right arm noted to be red Rash noted to right arm where tape was placed 100% O2 sat on room air Patient denies c/o SOB, feeling like throat is closing up Patient able to speak in full, complete sentences without difficulty

## 2014-01-31 ENCOUNTER — Other Ambulatory Visit: Payer: Self-pay | Admitting: Oncology

## 2014-01-31 ENCOUNTER — Emergency Department (HOSPITAL_COMMUNITY)
Admission: EM | Admit: 2014-01-31 | Discharge: 2014-02-01 | Disposition: A | Discharge status: Home / Self Care | Payer: No Typology Code available for payment source | Attending: Emergency Medicine | Admitting: Emergency Medicine

## 2014-01-31 DIAGNOSIS — Z85038 Personal history of other malignant neoplasm of large intestine: Secondary | ICD-10-CM | POA: Insufficient documentation

## 2014-01-31 DIAGNOSIS — R197 Diarrhea, unspecified: Secondary | ICD-10-CM | POA: Diagnosis not present

## 2014-01-31 DIAGNOSIS — Z862 Personal history of diseases of the blood and blood-forming organs and certain disorders involving the immune mechanism: Secondary | ICD-10-CM | POA: Insufficient documentation

## 2014-01-31 DIAGNOSIS — K219 Gastro-esophageal reflux disease without esophagitis: Secondary | ICD-10-CM | POA: Insufficient documentation

## 2014-01-31 DIAGNOSIS — G43909 Migraine, unspecified, not intractable, without status migrainosus: Secondary | ICD-10-CM | POA: Insufficient documentation

## 2014-01-31 DIAGNOSIS — Z880 Allergy status to penicillin: Secondary | ICD-10-CM | POA: Diagnosis not present

## 2014-01-31 DIAGNOSIS — Z792 Long term (current) use of antibiotics: Secondary | ICD-10-CM | POA: Insufficient documentation

## 2014-01-31 DIAGNOSIS — Z79899 Other long term (current) drug therapy: Secondary | ICD-10-CM | POA: Insufficient documentation

## 2014-01-31 DIAGNOSIS — R1013 Epigastric pain: Secondary | ICD-10-CM | POA: Insufficient documentation

## 2014-01-31 NOTE — ED Notes (Signed)
Patient able to drink several ginger ale sodas without N/V

## 2014-01-31 NOTE — ED Notes (Signed)

## 2014-02-01 ENCOUNTER — Encounter (HOSPITAL_COMMUNITY): Payer: Self-pay | Admitting: Emergency Medicine

## 2014-02-01 ENCOUNTER — Other Ambulatory Visit: Payer: Self-pay | Admitting: Neurology

## 2014-02-01 MED ORDER — HYOSCYAMINE SULFATE 0.5 MG/ML IJ SOLN
0.1250 mg | Freq: Once | INTRAMUSCULAR | Status: AC
  Administered 2014-02-01: 0.125 mg via INTRAVENOUS
  Filled 2014-02-01: qty 0.25

## 2014-02-01 MED ORDER — DIPHENHYDRAMINE HCL 50 MG/ML IJ SOLN
12.5000 mg | Freq: Once | INTRAMUSCULAR | Status: AC
  Administered 2014-02-01: 12.5 mg via INTRAVENOUS
  Filled 2014-02-01: qty 1

## 2014-02-01 MED ORDER — HYDROMORPHONE HCL 1 MG/ML IJ SOLN
0.5000 mg | Freq: Once | INTRAMUSCULAR | Status: AC
  Administered 2014-02-01: 0.5 mg via INTRAVENOUS
  Filled 2014-02-01: qty 1

## 2014-02-01 MED ORDER — PROMETHAZINE HCL 25 MG PO TABS
25.0000 mg | ORAL_TABLET | Freq: Once | ORAL | Status: AC
  Administered 2014-02-01: 25 mg via ORAL
  Filled 2014-02-01: qty 1

## 2014-02-01 MED ORDER — HYDROCODONE-ACETAMINOPHEN 5-325 MG PO TABS
1.0000 | ORAL_TABLET | Freq: Once | ORAL | Status: AC
  Administered 2014-02-01: 1 via ORAL
  Filled 2014-02-01: qty 1

## 2014-02-01 MED ORDER — PROMETHAZINE HCL 25 MG PO TABS: 25.0000 mg | ORAL_TABLET | Freq: Four times a day (QID) | ORAL | Status: DC | PRN

## 2014-02-01 MED ORDER — HYDROMORPHONE HCL 1 MG/ML IJ SOLN
1.0000 mg | Freq: Once | INTRAMUSCULAR | Status: AC
  Administered 2014-02-01: 1 mg via INTRAVENOUS
  Filled 2014-02-01: qty 1

## 2014-02-01 MED ORDER — HYDROCODONE-ACETAMINOPHEN 5-325 MG PO TABS: 1.0000 | ORAL_TABLET | ORAL | Status: DC | PRN

## 2014-02-01 NOTE — Discharge Instructions (Signed)
Abdominal Pain Many things can cause abdominal pain. Usually, abdominal pain is not caused by a disease and will improve without treatment. It can often be observed and treated at home. Your health care provider will do a physical exam and possibly order blood tests and X-rays to help determine the seriousness of your pain. However, in many cases, more time must pass before a clear cause of the pain can be found. Before that point, your health care provider may not know if you need more testing or further treatment. HOME CARE INSTRUCTIONS  Monitor your abdominal pain for any changes. The following actions may help to alleviate any discomfort you are experiencing:  Only take over-the-counter or prescription medicines as directed by your health care provider.  Do not take laxatives unless directed to do so by your health care provider.  Try a clear liquid diet (broth, tea, or water) as directed by your health care provider. Slowly move to a bland diet as tolerated. SEEK MEDICAL CARE IF:  You have unexplained abdominal pain.  You have abdominal pain associated with nausea or diarrhea.  You have pain when you urinate or have a bowel movement.  You experience abdominal pain that wakes you in the night.  You have abdominal pain that is worsened or improved by eating food.  You have abdominal pain that is worsened with eating fatty foods.  You have a fever. SEEK IMMEDIATE MEDICAL CARE IF:   Your pain does not go away within 2 hours.  You keep throwing up (vomiting).  Your pain is felt only in portions of the abdomen, such as the right side or the left lower portion of the abdomen.  You pass bloody or black tarry stools. MAKE SURE YOU:  Understand these instructions.   Will watch your condition.   Will get help right away if you are not doing well or get worse.  Document Released: 11/01/2004 Document Revised: 01/27/2013 Document Reviewed: 10/01/2012 [facility] Patient Information  2015 [place], [place]. This information is not intended to replace advice given to you by your health care provider. Make sure you discuss any questions you have with your health care provider. Food Choices to Help Relieve Diarrhea When you have diarrhea, the foods you eat and your eating habits are very important. Choosing the right foods and drinks can help relieve diarrhea. Also, because diarrhea can last up to 7 days, you need to replace lost fluids and electrolytes (such as sodium, potassium, and chloride) in order to help prevent dehydration.  WHAT GENERAL GUIDELINES DO I NEED TO FOLLOW?  Slowly drink 1 cup (8 oz) of fluid for each episode of diarrhea. If you are getting enough fluid, your urine will be clear or pale yellow.  Eat starchy foods. Some good choices include white rice, white toast, pasta, low-fiber cereal, baked potatoes (without the skin), saltine crackers, and bagels.  Avoid large servings of any cooked vegetables.  Limit fruit to two servings per day. A serving is  cup or 1 small piece.  Choose foods with less than 2 g of fiber per serving.  Limit fats to less than 8 tsp (38 g) per day.  Avoid fried foods.  Eat foods that have probiotics in them. Probiotics can be found in certain dairy products.  Avoid foods and beverages that may increase the speed at which food moves through the stomach and intestines (gastrointestinal tract). Things to avoid include:  High-fiber foods, such as dried fruit, raw fruits and vegetables, nuts, seeds, and whole  grain foods.  Spicy foods and high-fat foods.  Foods and beverages sweetened with high-fructose corn syrup, honey, or sugar alcohols such as xylitol, sorbitol, and mannitol. WHAT FOODS ARE RECOMMENDED? Grains White rice. White, [place], or pita breads (fresh or toasted), including plain rolls, buns, or bagels. White pasta. Saltine, soda, or graham crackers. Pretzels. Low-fiber cereal. Cooked cereals made with water (such as  cornmeal, farina, or cream cereals). Plain muffins. Matzo. Melba toast. Zwieback.  Vegetables Potatoes (without the skin). Strained tomato and vegetable juices. Most well-cooked and canned vegetables without seeds. Tender lettuce. Fruits Cooked or canned applesauce, apricots, cherries, fruit cocktail, grapefruit, peaches, pears, or plums. Fresh bananas, apples without skin, cherries, grapes, cantaloupe, grapefruit, peaches, oranges, or plums.  Meat and Other Protein Products Baked or boiled chicken. Eggs. Tofu. Fish. Seafood. Smooth peanut butter. Ground or well-cooked tender beef, ham, veal, lamb, pork, or poultry.  Dairy Plain yogurt, kefir, and unsweetened liquid yogurt. Lactose-free milk, buttermilk, or soy milk. Plain hard cheese. Beverages Sport drinks. Clear broths. Diluted fruit juices (except prune). Regular, caffeine-free sodas such as ginger ale. Water. Decaffeinated teas. Oral rehydration solutions. Sugar-free beverages not sweetened with sugar alcohols. Other Bouillon, broth, or soups made from recommended foods.  The items listed above may not be a complete list of recommended foods or beverages. Contact your dietitian for more options. WHAT FOODS ARE NOT RECOMMENDED? Grains Whole grain, whole wheat, bran, or rye breads, rolls, pastas, crackers, and cereals. Wild or Sohm rice. Cereals that contain more than 2 g of fiber per serving. Corn tortillas or taco shells. Cooked or dry oatmeal. Granola. Popcorn. Vegetables Raw vegetables. Cabbage, broccoli, Brussels sprouts, artichokes, baked beans, beet greens, corn, kale, legumes, peas, sweet potatoes, and yams. Potato skins. Cooked spinach and cabbage. Fruits Dried fruit, including raisins and dates. Raw fruits. Stewed or dried prunes. Fresh apples with skin, apricots, mangoes, pears, raspberries, and strawberries.  Meat and Other Protein Products Chunky peanut butter. Nuts and seeds. Beans and lentils. Berniece Salines.  Dairy High-fat  cheeses. Milk, chocolate milk, and beverages made with milk, such as milk shakes. Cream. Ice cream. Sweets and Desserts Sweet rolls, doughnuts, and sweet breads. Pancakes and waffles. Fats and Oils Butter. Cream sauces. Margarine. Salad oils. Plain salad dressings. Olives. Avocados.  Beverages Caffeinated beverages (such as coffee, tea, soda, or energy drinks). Alcoholic beverages. Fruit juices with pulp. Prune juice. Soft drinks sweetened with high-fructose corn syrup or sugar alcohols. Other Coconut. Hot sauce. Chili powder. Mayonnaise. Gravy. Cream-based or milk-based soups.  The items listed above may not be a complete list of foods and beverages to avoid. Contact your dietitian for more information. WHAT SHOULD I DO IF I BECOME DEHYDRATED? Diarrhea can sometimes lead to dehydration. Signs of dehydration include dark urine and dry mouth and skin. If you think you are dehydrated, you should rehydrate with an oral rehydration solution. These solutions can be purchased at pharmacies, retail stores, or online.  Drink -1 cup (120-240 mL) of oral rehydration solution each time you have an episode of diarrhea. If drinking this amount makes your diarrhea worse, try drinking smaller amounts more often. For example, drink 1-3 tsp (5-15 mL) every 5-10 minutes.  A general rule for staying hydrated is to drink 1-2 L of fluid per day. Talk to your health care provider about the specific amount you should be drinking each day. Drink enough fluids to keep your urine clear or pale yellow. Document Released: 04/14/2003 Document Revised: 01/27/2013 Document Reviewed: 12/15/2012 [employer] Patient Information 2015  [employer]. This information is not intended to replace advice given to you by your health care provider. Make sure you discuss any questions you have with your health care provider.

## 2014-02-01 NOTE — ED Notes (Signed)
Pt reports that she has been having abdominal pain since the morning of 12/26, seen here yesterday and told to return if she had not improved. Pt states that she has continued abdominal pain, worse in the past 1.5 hr. Pt reports continued n/v/d with inability to keep any food or fluids down. Pt a&ox4, skin warm and dry, ambulatory to triage without difficulty.

## 2014-02-01 NOTE — ED Provider Notes (Signed)
CSN: [id number]     Arrival date & time 01/31/14  2333 History   First MD Initiated Contact with Patient 02/01/14 0020     Chief Complaint  Patient presents with  . Abdominal Pain     (Consider location/radiation/quality/duration/timing/severity/associated sxs/prior Treatment) Patient is a 38 y.o. female presenting with abdominal pain. The history is provided by the patient. No language interpreter was used.  Abdominal Pain Pain location:  RUQ, RLQ and epigastric Pain quality: aching and cramping   Associated symptoms: diarrhea, nausea and vomiting   Associated symptoms: no chills and no fever   Associated symptoms comment:  She started having loose stools 2 days ago, worsening to multiple watery, non-bloody bowel movements yesterday without fever. She has had upper abdominal cramping type pain, with nausea and vomiting. She has a history of colon cancer with h/o resection and currently undergoing chemotherapy. She was seen in the emergency department yesterday and returns for persistent symptoms stating that Zofran and Immodium are not controlling symptoms.   Past Medical History  Diagnosis Date  . GERD (gastroesophageal reflux disease)   . Seizures     last seizure 08/2012  . History of migraine     no problems in "a long time"  . Anemia, iron deficiency   . Restless leg syndrome   . Colon cancer 08/2013  . History of blood transfusion 08/16/2013  . Difficulty swallowing pills    Past Surgical History  Procedure Laterality Date  . Esophagogastroduodenoscopy  07/07/2013  . Laparoscopic partial colectomy Right 08/11/2013    Procedure: LAPAROSCOPIC RIGHT HEMICOLECTOMY;  Surgeon: Stark Klein, MD;  Location: [facility];  Service: General;  Laterality: Right;  . Colonoscopy with propofol  07/07/2013  . Tubal ligation  11/07/2000  . Endometrial fulguration  11/01/2003  . Laparoscopic lysis of adhesions  11/01/2003  . Cystoscopy  11/01/2003  . Appendectomy  08/24/2004  . Abdominal hysterectomy   08/24/2004    partial  . Unilateral salpingectomy Left 08/24/2004  . Left oophorectomy  08/24/2004  . Panniculectomy  08/24/2004  . Portacath placement Left 09/09/2013    Procedure: INSERTION PORT-A-CATH;  Surgeon: Stark Klein, MD;  Location: [facility];  Service: General;  Laterality: Left;   Family History  Problem Relation Age of Onset  . Heart attack Mother   . Colon polyps Mother   . Colitis Mother   . Diabetes Father   . Heart attack Father   . Stroke Father   . Cirrhosis Maternal Aunt   . Cancer Maternal Uncle     unknown type  . Colon cancer Neg Hx   . Esophageal cancer Neg Hx   . Rectal cancer Neg Hx   . Stomach cancer Neg Hx    History  Substance Use Topics  . Smoking status: Never Smoker   . Smokeless tobacco: Never Used  . Alcohol Use: No   OB History    No data available     Review of Systems  Constitutional: Negative for fever and chills.  Respiratory: Negative.   Cardiovascular: Negative.   Gastrointestinal: Positive for nausea, vomiting, abdominal pain and diarrhea.  Musculoskeletal: Negative.   Skin: Negative.   Neurological: Negative.       Allergies  Eggs or egg-derived products; Metoclopramide hcl; Oxycodone-acetaminophen; Sulfa antibiotics; Adhesive; and Penicillins  Home Medications   Prior to Admission medications   Medication Sig Start Date End Date Taking? Authorizing Provider  ferrous fumarate (HEMOCYTE) 325 (106 FE) MG TABS tablet Take 1 tablet (106 mg  of iron total) by mouth daily. 09/03/13  Yes [name], MD  lamoTRIgine (LAMICTAL) 100 MG tablet Take 1 tablet (100 mg total) by mouth 2 (two) times daily. Patient taking differently: Take 200 mg by mouth every evening.  08/20/13  Yes Adam Melvern Sample, DO  lidocaine-prilocaine (EMLA) cream Apply 1 application topically as needed. Apply 1 teaspoon to [facility] site 2 hours prior to stick and cover with plastic wrap to numb site 09/04/13  Yes [name], MD  LORazepam  (ATIVAN) 1 MG tablet Take 1 tablet (1 mg total) by mouth 2 (two) times daily as needed for anxiety or sleep. 01/21/14  Yes Owens Shark, NP  omeprazole (PRILOSEC OTC) 20 MG tablet Take 20 mg by mouth daily.    Yes Historical Provider, MD  ondansetron (ZOFRAN ODT) 4 MG disintegrating tablet 4mg  ODT q4 hours prn nausea/vomit Patient taking differently: Take 4 mg by mouth every 8 (eight) hours as needed for nausea.  01/30/14  Yes Debby Freiberg, MD  rOPINIRole (REQUIP) 4 MG tablet Take 1 tablet (4 mg total) by mouth at bedtime. 06/17/13  Yes Adam Melvern Sample, DO  Alum & Mag Hydroxide-Simeth (MAGIC MOUTHWASH W/LIDOCAINE) SOLN Take 5 mLs by mouth 4 (four) times daily as needed for mouth pain. 10/28/13   Drue Second, NP  fluconazole (DIFLUCAN) 150 MG tablet Take 1 tablet (150 mg total) by mouth once. Repeat in one week if symptoms are still present. Patient not taking: Reported on 01/21/2014 01/08/14   Owens Shark, NP  levofloxacin (LEVAQUIN) 500 MG tablet Take 1 tablet (500 mg total) by mouth daily. Patient not taking: Reported on 01/04/2014 12/18/13   Drue Second, NP  ondansetron (ZOFRAN) 8 MG tablet TAKE ONE TABLET BY MOUTH EVERY 8 HOURS AS NEEDED FOR NAUSEA OR VOMITING. 12/07/13   [name], MD   BP 121/70 mmHg  Pulse 84  Temp(Src) 98.5 F (36.9 C) (Oral)  Resp 16  SpO2 100% Physical Exam  Constitutional: She is oriented to person, place, and time. She appears well-developed and well-nourished.  HENT:  Head: Normocephalic.  Mouth/Throat: Mucous membranes are dry.  Neck: Normal range of motion. Neck supple.  Cardiovascular: Normal rate and regular rhythm.   Pulmonary/Chest: Effort normal and breath sounds normal.  Abdominal: Soft. Bowel sounds are normal. There is tenderness. There is no rebound and no guarding.  Mild tenderness to soft, nondistended abdomen.  Musculoskeletal: Normal range of motion.  Neurological: She is alert and oriented to person, place, and time.  Skin: Skin  is warm and dry. No rash noted.  Psychiatric: She has a normal mood and affect.    ED Course  Procedures (including critical care time) Labs Review Labs Reviewed - No data to display  Imaging Review Ct Abdomen Pelvis W Contrast  01/30/2014   CLINICAL DATA:  Diarrhea, abdominal cramping, vomiting. History of colon cancer.  EXAM: CT ABDOMEN AND PELVIS WITH CONTRAST  TECHNIQUE: Multidetector CT imaging of the abdomen and pelvis was performed using the standard protocol following bolus administration of intravenous contrast.  CONTRAST:  167mL OMNIPAQUE IOHEXOL 300 MG/ML  SOLN  COMPARISON:  07/21/2013  FINDINGS: Lung bases are clear.  No effusions.  Heart is normal size.  Liver, gallbladder, pancreas, adrenals and kidneys are normal. There is splenomegaly. The craniocaudal length of the spleen measures 17.4 cm compared with 15.4 cm previously. No focal lesions.  Changes of right hemicolectomy. Previously seen enlarged right mesenteric lymph nodes are no longer visualized. No adenopathy  currently. Trace free fluid in the pelvis, presumably physiologic. Right ovary is prominent but is stable compared to prior study. Prior hysterectomy. Urinary bladder is unremarkable.  Stomach and small bowel are decompressed, unremarkable. No evidence of bowel obstruction.  No acute bony abnormality or focal bone lesion.  IMPRESSION: Prior right hemicolectomy. No evidence of recurrent or metastatic disease in the abdomen or pelvis.  Increasing splenomegaly.   Electronically Signed   By: Rolm Baptise M.D.   On: 01/30/2014 21:08     EKG Interpretation None      MDM   Final diagnoses:  None   1. Abdominal pain 2. diarrhea   VSS, no tachycardia. Chart reviewed. She received IV fluids yesterday. Normal lab studies. CT scan performed yesterday negative for acute changes and negative for metastatic CA. Plan: medicate with pain and nausea medications, PO challenge and re-evaluate. She does state that she has an egg  allergy and was given food with egg content over the holiday that she feels may have contributed to her symptoms.   Pain is no better with PO Phenergan and Norco. IV started. VSS.   Pain is no better with IV benadryl and Levsin. VSS. No further diarrhea or vomiting in ED.   Pain is slightly better with IV Dilaudid. Continues to improve over time. She states that she is ready for discharge home. She is currently established with [name] in the outpatient setting and is encouraged to call the officer later today for a recheck appointment.   Dewaine Oats, PA-C 02/01/14 [place], MD 02/01/14 [phone]

## 2014-02-02 ENCOUNTER — Other Ambulatory Visit (HOSPITAL_BASED_OUTPATIENT_CLINIC_OR_DEPARTMENT_OTHER): Payer: No Typology Code available for payment source

## 2014-02-02 ENCOUNTER — Other Ambulatory Visit: Payer: Self-pay | Admitting: *Deleted

## 2014-02-02 ENCOUNTER — Ambulatory Visit (HOSPITAL_BASED_OUTPATIENT_CLINIC_OR_DEPARTMENT_OTHER): Payer: No Typology Code available for payment source | Admitting: Nurse Practitioner

## 2014-02-02 ENCOUNTER — Telehealth: Payer: Self-pay | Admitting: *Deleted

## 2014-02-02 ENCOUNTER — Ambulatory Visit (HOSPITAL_COMMUNITY)
Admission: RE | Admit: 2014-02-02 | Discharge: 2014-02-02 | Disposition: A | Discharge status: Home / Self Care | Payer: No Typology Code available for payment source | Source: Ambulatory Visit | Attending: Oncology | Admitting: Oncology

## 2014-02-02 VITALS — BP 112/64 | HR 85 | Temp 98.6°F | Resp 18 | Ht 66.0 in | Wt 171.0 lb

## 2014-02-02 DIAGNOSIS — C182 Malignant neoplasm of ascending colon: Secondary | ICD-10-CM

## 2014-02-02 DIAGNOSIS — R197 Diarrhea, unspecified: Secondary | ICD-10-CM

## 2014-02-02 DIAGNOSIS — E876 Hypokalemia: Secondary | ICD-10-CM

## 2014-02-02 DIAGNOSIS — E860 Dehydration: Secondary | ICD-10-CM

## 2014-02-02 LAB — MAGNESIUM (CC13): Magnesium: 2.3 mg/dl (ref 1.5–2.5)

## 2014-02-02 LAB — CBC WITH DIFFERENTIAL/PLATELET
BASO%: 0.5 % (ref 0.0–2.0)
BASOS ABS: 0 10*3/uL (ref 0.0–0.1)
EOS ABS: 0.1 10*3/uL (ref 0.0–0.5)
EOS%: 2.4 % (ref 0.0–7.0)
HCT: 38.5 % (ref 34.8–46.6)
HEMOGLOBIN: 12.6 g/dL (ref 11.6–15.9)
LYMPH%: 20.4 % (ref 14.0–49.7)
MCH: 29.1 pg (ref 25.1–34.0)
MCHC: 32.8 g/dL (ref 31.5–36.0)
MCV: 88.5 fL (ref 79.5–101.0)
MONO#: 0.4 10*3/uL (ref 0.1–0.9)
MONO%: 8.8 % (ref 0.0–14.0)
NEUT%: 67.9 % (ref 38.4–76.8)
NEUTROS ABS: 3.1 10*3/uL (ref 1.5–6.5)
PLATELETS: 94 10*3/uL — AB (ref 145–400)
RBC: 4.35 10*6/uL (ref 3.70–5.45)
RDW: 18.3 % — ABNORMAL HIGH (ref 11.2–14.5)
WBC: 4.6 10*3/uL (ref 3.9–10.3)
lymph#: 0.9 10*3/uL (ref 0.9–3.3)

## 2014-02-02 LAB — COMPREHENSIVE METABOLIC PANEL (CC13)
ALT: 17 U/L (ref 0–55)
ANION GAP: 10 meq/L (ref 3–11)
AST: 20 U/L (ref 5–34)
Albumin: 3.6 g/dL (ref 3.5–5.0)
Alkaline Phosphatase: 87 U/L (ref 40–150)
BUN: 13.1 mg/dL (ref 7.0–26.0)
CO2: 24 meq/L (ref 22–29)
Calcium: 8.9 mg/dL (ref 8.4–10.4)
Chloride: 106 mEq/L (ref 98–109)
Creatinine: 0.8 mg/dL (ref 0.6–1.1)
GLUCOSE: 151 mg/dL — AB (ref 70–140)
POTASSIUM: 3.4 meq/L — AB (ref 3.5–5.1)
Sodium: 140 mEq/L (ref 136–145)
TOTAL PROTEIN: 6.6 g/dL (ref 6.4–8.3)
Total Bilirubin: 0.91 mg/dL (ref 0.20–1.20)

## 2014-02-02 MED ORDER — SODIUM CHLORIDE 0.9 % IV SOLN
1500.0000 mL | INTRAVENOUS | Status: AC
  Administered 2014-02-02: 15:00:00 via INTRAVENOUS

## 2014-02-02 MED ORDER — SODIUM CHLORIDE 0.9 % IJ SOLN
10.0000 mL | INTRAMUSCULAR | Status: DC | PRN
  Administered 2014-02-02: 10 mL via INTRAVENOUS
  Filled 2014-02-02: qty 10

## 2014-02-02 MED ORDER — HEPARIN SOD (PORK) LOCK FLUSH 100 UNIT/ML IV SOLN
500.0000 [IU] | Freq: Once | INTRAVENOUS | Status: AC
  Administered 2014-02-02: 500 [IU] via INTRAVENOUS
  Filled 2014-02-02: qty 5

## 2014-02-02 MED ORDER — DIPHENOXYLATE-ATROPINE 2.5-0.025 MG PO TABS: 2.0000 | ORAL_TABLET | Freq: Four times a day (QID) | ORAL | Status: DC | PRN

## 2014-02-02 NOTE — Addendum Note (Signed)
Addended by: Domenic Schwab on: 02/02/2014 09:53 AM   Modules accepted: Orders

## 2014-02-02 NOTE — Telephone Encounter (Signed)
Pt called reports "I'm having a lot of diarrhea; going every 10 minutes"  Spoke with pt and she states she has been to the ED x2 the past weekend and now the diarrhea has started again.  She reports "I've had 10 episodes this morning already and the Imodium is not working at all"  Pt denies fever, N/V.  States she is able to drink "but it goes right through me."  Per Dr. Benay Spice; instructed pt to come in to see Selena Lesser, NP in [facility] for evaluation now.  Pt verbalized she will not have transportation until 1pm today.  Pt confirmed she can be here at 2pm for [facility].

## 2014-02-03 ENCOUNTER — Encounter: Payer: Self-pay | Admitting: Nurse Practitioner

## 2014-02-03 DIAGNOSIS — E876 Hypokalemia: Secondary | ICD-10-CM | POA: Insufficient documentation

## 2014-02-03 NOTE — Assessment & Plan Note (Signed)
Potassium was slightly low at 3.4.  Patient was encouraged to push potassium-rich diet.

## 2014-02-03 NOTE — Assessment & Plan Note (Signed)
Patient is complaining of some frequent diarrhea for the past several days; and does feel dehydrated today.  Patient will receive 1 L normal saline IV fluid rehydration today.  Also advised patient she may very well need additional IV fluid rehydration later this week.

## 2014-02-03 NOTE — Progress Notes (Signed)
will   SYMPTOM MANAGEMENT CLINIC   HPI: [name] 38 y.o. female diagnosed with colon cancer.  Currently undergoing FOLFOX chemotherapy regimen.  Patient called the cancer Center today requesting urgent care visit.  Patient states that she is highly allergic to any egg product; and she ate some sausage balls over the Christmas holiday that contained headaches.  Approximately 24 hours after eating the sausage balls patient developed severe diarrhea; and subsequent dehydration.  She's been trying Imodium over-the-counter with minimal effectiveness.  She feels fairly dehydrated today; his requested IV fluid rehydration.  She denies any other new symptoms whatsoever.  She denies any recent fevers or chills.   HPI  CURRENT THERAPY: Upcoming Treatment Dates - COLORECTAL FOLFOX q14d Days with orders from any treatment category:  02/04/2014      SCHEDULING COMMUNICATION      ondansetron (ZOFRAN) IVPB 8 mg      dexamethasone (DECADRON) injection 10 mg      oxaliplatin (ELOXATIN) 165 mg in dextrose 5 % 500 mL chemo infusion      leucovorin 772 mg in dextrose 5 % 250 mL infusion      fluorouracil (ADRUCIL) chemo injection 750 mg      fluorouracil (ADRUCIL) 4,650 mg in sodium chloride 0.9 % 150 mL chemo infusion      sodium chloride 0.9 % injection 10 mL      heparin lock flush 100 unit/mL      heparin lock flush 100 unit/mL      alteplase (CATHFLO ACTIVASE) injection 2 mg      sodium chloride 0.9 % injection 3 mL      Cold Pack 1 packet      Hot Pack 1 packet      dextrose 5 % solution      TREATMENT CONDITIONS 02/06/2014      pegfilgrastim (NEULASTA) injection 6 mg      SCHEDULING COMMUNICATION      sodium chloride 0.9 % injection 10 mL      heparin lock flush 100 unit/mL      heparin lock flush 100 unit/mL      alteplase (CATHFLO ACTIVASE) injection 2 mg      sodium chloride 0.9 % injection 3 mL      Cold Pack 1 packet 02/18/2014      SCHEDULING COMMUNICATION      ondansetron  (ZOFRAN) IVPB 8 mg      dexamethasone (DECADRON) injection 10 mg      oxaliplatin (ELOXATIN) 165 mg in dextrose 5 % 500 mL chemo infusion      leucovorin 772 mg in dextrose 5 % 250 mL infusion      fluorouracil (ADRUCIL) chemo injection 750 mg      fluorouracil (ADRUCIL) 4,650 mg in sodium chloride 0.9 % 150 mL chemo infusion      sodium chloride 0.9 % injection 10 mL      heparin lock flush 100 unit/mL      heparin lock flush 100 unit/mL      alteplase (CATHFLO ACTIVASE) injection 2 mg      sodium chloride 0.9 % injection 3 mL      Cold Pack 1 packet      Hot Pack 1 packet      dextrose 5 % solution      TREATMENT CONDITIONS    ROS  Past Medical History  Diagnosis Date  . GERD (gastroesophageal reflux disease)   . Seizures     last seizure 08/2012  .  History of migraine     no problems in "a long time"  . Anemia, iron deficiency   . Restless leg syndrome   . Colon cancer 08/2013  . History of blood transfusion 08/16/2013  . Difficulty swallowing pills     Past Surgical History  Procedure Laterality Date  . Esophagogastroduodenoscopy  07/07/2013  . Laparoscopic partial colectomy Right 08/11/2013    Procedure: LAPAROSCOPIC RIGHT HEMICOLECTOMY;  Surgeon: Stark Klein, MD;  Location: [facility];  Service: General;  Laterality: Right;  . Colonoscopy with propofol  07/07/2013  . Tubal ligation  11/07/2000  . Endometrial fulguration  11/01/2003  . Laparoscopic lysis of adhesions  11/01/2003  . Cystoscopy  11/01/2003  . Appendectomy  08/24/2004  . Abdominal hysterectomy  08/24/2004    partial  . Unilateral salpingectomy Left 08/24/2004  . Left oophorectomy  08/24/2004  . Panniculectomy  08/24/2004  . Portacath placement Left 09/09/2013    Procedure: INSERTION PORT-A-CATH;  Surgeon: Stark Klein, MD;  Location: [facility];  Service: General;  Laterality: Left;    has HYPERLIPIDEMIA; IRON DEFICIENCY; DEPRESSION/ANXIETY; RESTLESS LEG SYNDROME; GERD; Gastroparesis; IBS; FATTY LIVER  DISEASE; OVARIAN CYST; TRANSAMINASES, SERUM, ELEVATED; ANA POSITIVE; LIBIDO, DECREASED; Right wrist pain; ADD (attention deficit disorder); Fatigue; Hyperglycemia; Migraine; Medication reaction; Discoloration of skin of face; Primary generalized seizure disorder; Anemia, iron deficiency; Cancer of ascending colon s/p right colectomy 08/11/2013; Abdominal pain; Anemia due to antineoplastic chemotherapy; Chemotherapy induced thrombocytopenia; Hypomagnesemia; Hypophosphatemia; Adverse drug effect; Diarrhea; Malfunction of device; Weight loss; Mucositis (ulcerative) due to antineoplastic therapy; Fever; Dehydration; Nausea; and Hypokalemia on her problem list.     is allergic to eggs or egg-derived products; metoclopramide hcl; oxycodone-acetaminophen; sulfa antibiotics; adhesive; and penicillins.    Medication List       This list is accurate as of: 02/02/14 11:59 PM.  Always use your most recent med list.               diphenoxylate-atropine 2.5-0.025 MG per tablet  Commonly known as:  LOMOTIL  Take 2 tablets by mouth 4 (four) times daily as needed for diarrhea or loose stools.     ferrous fumarate 325 (106 FE) MG Tabs tablet  Commonly known as:  HEMOCYTE  Take 1 tablet (106 mg of iron total) by mouth daily.     fluconazole 150 MG tablet  Commonly known as:  DIFLUCAN  Take 1 tablet (150 mg total) by mouth once. Repeat in one week if symptoms are still present.     HYDROcodone-acetaminophen 5-325 MG per tablet  Commonly known as:  NORCO/VICODIN  Take 1-2 tablets by mouth every 4 (four) hours as needed.     lamoTRIgine 100 MG tablet  Commonly known as:  LAMICTAL  Take 1 tablet (100 mg total) by mouth 2 (two) times daily.     lidocaine-prilocaine cream  Commonly known as:  EMLA  Apply 1 application topically as needed. Apply 1 teaspoon to PAC site 2 hours prior to stick and cover with plastic wrap to numb site     LORazepam 1 MG tablet  Commonly known as:  ATIVAN  Take 1 tablet (1 mg  total) by mouth 2 (two) times daily as needed for anxiety or sleep.     magic mouthwash w/lidocaine Soln  Take 5 mLs by mouth 4 (four) times daily as needed for mouth pain.     omeprazole 20 MG tablet  Commonly known as:  PRILOSEC OTC  Take 20 mg by mouth daily.  ondansetron 4 MG disintegrating tablet  Commonly known as:  ZOFRAN ODT  $Re'4mg'uqN$  ODT q4 hours prn nausea/vomit     ondansetron 8 MG tablet  Commonly known as:  ZOFRAN  TAKE ONE TABLET BY MOUTH EVERY 8 HOURS AS NEEDED FOR NAUSEA OR VOMITING.     promethazine 25 MG tablet  Commonly known as:  PHENERGAN  Take 1 tablet (25 mg total) by mouth every 6 (six) hours as needed for nausea or vomiting.     rOPINIRole 4 MG tablet  Commonly known as:  REQUIP  TAKE ONE TABLET BY MOUTH AT BEDTIME.         PHYSICAL EXAMINATION  Blood pressure 112/64, pulse 85, temperature 98.6 F (37 C), resp. rate 18, height $RemoveBe'5\' 6"'RqViEoMTw$  (1.676 m), weight 171 lb (77.565 kg), SpO2 100 %.  Physical Exam  Constitutional: She is oriented to person, place, and time. She appears unhealthy.  HENT:  Head: Normocephalic and atraumatic.  Mouth/Throat: No oropharyngeal exudate.  Eyes: Conjunctivae and EOM are normal. Pupils are equal, round, and reactive to light. Right eye exhibits no discharge. Left eye exhibits no discharge. No scleral icterus.  Neck: Normal range of motion. Neck supple. No JVD present. No tracheal deviation present. No thyromegaly present.  Cardiovascular: Normal rate, regular rhythm, normal heart sounds and intact distal pulses.   Pulmonary/Chest: Effort normal and breath sounds normal.  Abdominal: Soft. Bowel sounds are normal. She exhibits no distension and no mass. There is no tenderness. There is no rebound and no guarding.  Musculoskeletal: Normal range of motion. She exhibits no edema or tenderness.  Lymphadenopathy:    She has no cervical adenopathy.  Neurological: She is alert and oriented to person, place, and time. Gait normal.    Skin: Skin is warm and dry. No rash noted. No erythema. There is pallor.  Psychiatric: Affect normal.  Nursing note and vitals reviewed.   LABORATORY DATA:. Appointment on 02/02/2014  Component Date Value Ref Range Status  . Sodium 02/02/2014 140  136 - 145 mEq/L Final  . Potassium 02/02/2014 3.4* 3.5 - 5.1 mEq/L Final  . Chloride 02/02/2014 106  98 - 109 mEq/L Final  . CO2 02/02/2014 24  22 - 29 mEq/L Final  . Glucose 02/02/2014 151* 70 - 140 mg/dl Final  . BUN 02/02/2014 13.1  7.0 - 26.0 mg/dL Final  . Creatinine 02/02/2014 0.8  0.6 - 1.1 mg/dL Final  . Total Bilirubin 02/02/2014 0.91  0.20 - 1.20 mg/dL Final  . Alkaline Phosphatase 02/02/2014 87  40 - 150 U/L Final  . AST 02/02/2014 20  5 - 34 U/L Final  . ALT 02/02/2014 17  0 - 55 U/L Final  . Total Protein 02/02/2014 6.6  6.4 - 8.3 g/dL Final  . Albumin 02/02/2014 3.6  3.5 - 5.0 g/dL Final  . Calcium 02/02/2014 8.9  8.4 - 10.4 mg/dL Final  . Anion Gap 02/02/2014 10  3 - 11 mEq/L Final  . EGFR 02/02/2014 >90  >90 ml/min/1.73 m2 Final   eGFR is calculated using the CKD-EPI Creatinine Equation (2009)  . WBC 02/02/2014 4.6  3.9 - 10.3 10e3/uL Final  . NEUT# 02/02/2014 3.1  1.5 - 6.5 10e3/uL Final  . HGB 02/02/2014 12.6  11.6 - 15.9 g/dL Final  . HCT 02/02/2014 38.5  34.8 - 46.6 % Final  . Platelets 02/02/2014 94* 145 - 400 10e3/uL Final  . MCV 02/02/2014 88.5  79.5 - 101.0 fL Final  . MCH 02/02/2014 29.1  25.1 - 34.0  pg Final  . MCHC 02/02/2014 32.8  31.5 - 36.0 g/dL Final  . RBC 02/02/2014 4.35  3.70 - 5.45 10e6/uL Final  . RDW 02/02/2014 18.3* 11.2 - 14.5 % Final  . lymph# 02/02/2014 0.9  0.9 - 3.3 10e3/uL Final  . MONO# 02/02/2014 0.4  0.1 - 0.9 10e3/uL Final  . Eosinophils Absolute 02/02/2014 0.1  0.0 - 0.5 10e3/uL Final  . Basophils Absolute 02/02/2014 0.0  0.0 - 0.1 10e3/uL Final  . NEUT% 02/02/2014 67.9  38.4 - 76.8 % Final  . LYMPH% 02/02/2014 20.4  14.0 - 49.7 % Final  . MONO% 02/02/2014 8.8  0.0 - 14.0 % Final   . EOS% 02/02/2014 2.4  0.0 - 7.0 % Final  . BASO% 02/02/2014 0.5  0.0 - 2.0 % Final  . Magnesium 02/02/2014 2.3  1.5 - 2.5 mg/dl Final  Admission on 01/30/2014, Discharged on 01/31/2014  Component Date Value Ref Range Status  . WBC 01/30/2014 7.2  4.0 - 10.5 K/uL Final  . RBC 01/30/2014 4.87  3.87 - 5.11 MIL/uL Final  . Hemoglobin 01/30/2014 14.5  12.0 - 15.0 g/dL Final  . HCT 01/30/2014 42.9  36.0 - 46.0 % Final  . MCV 01/30/2014 88.1  78.0 - 100.0 fL Final  . MCH 01/30/2014 29.8  26.0 - 34.0 pg Final  . MCHC 01/30/2014 33.8  30.0 - 36.0 g/dL Final  . RDW 01/30/2014 16.6* 11.5 - 15.5 % Final  . Platelets 01/30/2014 124* 150 - 400 K/uL Final  . Neutrophils Relative % 01/30/2014 81* 43 - 77 % Final  . Neutro Abs 01/30/2014 5.8  1.7 - 7.7 K/uL Final  . Lymphocytes Relative 01/30/2014 15  12 - 46 % Final  . Lymphs Abs 01/30/2014 1.1  0.7 - 4.0 K/uL Final  . Monocytes Relative 01/30/2014 3  3 - 12 % Final  . Monocytes Absolute 01/30/2014 0.2  0.1 - 1.0 K/uL Final  . Eosinophils Relative 01/30/2014 1  0 - 5 % Final  . Eosinophils Absolute 01/30/2014 0.1  0.0 - 0.7 K/uL Final  . Basophils Relative 01/30/2014 0  0 - 1 % Final  . Basophils Absolute 01/30/2014 0.0  0.0 - 0.1 K/uL Final  . Sodium 01/30/2014 136  135 - 145 mmol/L Final   Please note change in reference range.  . Potassium 01/30/2014 3.5  3.5 - 5.1 mmol/L Final   Please note change in reference range.  . Chloride 01/30/2014 103  96 - 112 mEq/L Final  . CO2 01/30/2014 26  19 - 32 mmol/L Final  . Glucose, Bld 01/30/2014 120* 70 - 99 mg/dL Final  . BUN 01/30/2014 16  6 - 23 mg/dL Final  . Creatinine, Ser 01/30/2014 0.72  0.50 - 1.10 mg/dL Final  . Calcium 01/30/2014 9.7  8.4 - 10.5 mg/dL Final  . Total Protein 01/30/2014 8.2  6.0 - 8.3 g/dL Final  . Albumin 01/30/2014 4.5  3.5 - 5.2 g/dL Final  . AST 01/30/2014 31  0 - 37 U/L Final  . ALT 01/30/2014 24  0 - 35 U/L Final  . Alkaline Phosphatase 01/30/2014 99  39 - 117 U/L  Final  . Total Bilirubin 01/30/2014 1.5* 0.3 - 1.2 mg/dL Final  . GFR calc non Af Amer 01/30/2014 >90  >90 mL/min Final  . GFR calc Af Amer 01/30/2014 >90  >90 mL/min Final   Comment: (NOTE) The eGFR has been calculated using the CKD EPI equation. This calculation has not been validated in all clinical  situations. eGFR's persistently <90 mL/min signify possible Chronic Kidney Disease.   . Anion gap 01/30/2014 7  5 - 15 Final  . Lipase 01/30/2014 43  11 - 59 U/L Final  . Color, Urine 01/30/2014 YELLOW  YELLOW Final  . APPearance 01/30/2014 CLEAR  CLEAR Final  . Specific Gravity, Urine 01/30/2014 >1.046* 1.005 - 1.030 Final  . pH 01/30/2014 5.0  5.0 - 8.0 Final  . Glucose, UA 01/30/2014 NEGATIVE  NEGATIVE mg/dL Final  . Hgb urine dipstick 01/30/2014 NEGATIVE  NEGATIVE Final  . Bilirubin Urine 01/30/2014 NEGATIVE  NEGATIVE Final  . Ketones, ur 01/30/2014 NEGATIVE  NEGATIVE mg/dL Final  . Protein, ur 01/30/2014 NEGATIVE  NEGATIVE mg/dL Final  . Urobilinogen, UA 01/30/2014 0.2  0.0 - 1.0 mg/dL Final  . Nitrite 01/30/2014 NEGATIVE  NEGATIVE Final  . Leukocytes, UA 01/30/2014 NEGATIVE  NEGATIVE Final   MICROSCOPIC NOT DONE ON URINES WITH NEGATIVE PROTEIN, BLOOD, LEUKOCYTES, NITRITE, OR GLUCOSE <1000 mg/dL.  . Preg Test, Ur 01/30/2014 NEGATIVE  NEGATIVE Final   Comment:        THE SENSITIVITY OF THIS METHODOLOGY IS >20 mIU/mL.      RADIOGRAPHIC STUDIES: Ct Abdomen Pelvis W Contrast  01/30/2014   CLINICAL DATA:  Diarrhea, abdominal cramping, vomiting. History of colon cancer.  EXAM: CT ABDOMEN AND PELVIS WITH CONTRAST  TECHNIQUE: Multidetector CT imaging of the abdomen and pelvis was performed using the standard protocol following bolus administration of intravenous contrast.  CONTRAST:  179mL OMNIPAQUE IOHEXOL 300 MG/ML  SOLN  COMPARISON:  07/21/2013  FINDINGS: Lung bases are clear.  No effusions.  Heart is normal size.  Liver, gallbladder, pancreas, adrenals and kidneys are normal. There  is splenomegaly. The craniocaudal length of the spleen measures 17.4 cm compared with 15.4 cm previously. No focal lesions.  Changes of right hemicolectomy. Previously seen enlarged right mesenteric lymph nodes are no longer visualized. No adenopathy currently. Trace free fluid in the pelvis, presumably physiologic. Right ovary is prominent but is stable compared to prior study. Prior hysterectomy. Urinary bladder is unremarkable.  Stomach and small bowel are decompressed, unremarkable. No evidence of bowel obstruction.  No acute bony abnormality or focal bone lesion.  IMPRESSION: Prior right hemicolectomy. No evidence of recurrent or metastatic disease in the abdomen or pelvis.  Increasing splenomegaly.   Electronically Signed   By: Rolm Baptise M.D.   On: 01/30/2014 21:08    ASSESSMENT/PLAN:    Cancer of ascending colon s/p right colectomy 08/11/2013 Patient received cycle 10 of her FOLFOX chemotherapy on 01/21/2014.  The oxaliplatin portion of her chemotherapy was held due to thrombocytopenia.  She is scheduled for cycle 11 of the same regimen on 02/04/2014.  Dehydration Patient is complaining of some frequent diarrhea for the past several days; and does feel dehydrated today.  Patient will receive 1 L normal saline IV fluid rehydration today.  Also advised patient she may very well need additional IV fluid rehydration later this week.  Hypokalemia Potassium was slightly low at 3.4.  Patient was encouraged to push potassium-rich diet.  Diarrhea Patient is complaining of significant diarrhea episodes for the past several days.  She presented to the emergency department twice over the weekend for the same complaint.  She was given IV fluid rehydration at that time as well.  Patient states that she has been taking Imodium frequently with minimal effectiveness.   Patient states that she has had diarrhea episodes like this in the past when she came in contact with eggs.  Patient states that she is  highly allergic to eggs; and typically experiences significant diarrhea when she consumes any type of eggs whatsoever.  She states that she did accidentally eat some eggs that were included as an ingredient in sausage balls over the Christmas holidays.  Within 24 hours of eating the sausage balls-patient was experiencing diarrhea.  Patient was given a stool kit to take home.  Patient states she'll try to return at first thing in the morning to rule out C. difficile.  Patient was given a prescription for Lomotil to try; and also was advised to continue to alternate with the Imodium.  Advised patient to call or return to the [place] she develops any worsening symptoms whatsoever.  Also, advised patient that she may very well need additional IV fluid rehydration; and she was encouraged to call if she continues feeling dehydrated.  Patient stated understanding of all instructions; and was in agreement with this plan of care. The patient knows to call the clinic with any problems, questions or concerns.   Review/collaboration with Dr. Benay Spice regarding all aspects of patient's visit today.   Total time spent with patient was 40 minutes;  with greater than 75 percent of that time spent in face to face counseling regarding her symptoms, and coordination of care and follow up.  Disclaimer: This note was dictated with voice recognition software. Similar sounding words can inadvertently be transcribed and may not be corrected upon review.   Drue Second, NP 02/03/2014

## 2014-02-03 NOTE — Assessment & Plan Note (Signed)
Patient received cycle 10 of her FOLFOX chemotherapy on 01/21/2014.  The oxaliplatin portion of her chemotherapy was held due to thrombocytopenia.  She is scheduled for cycle 11 of the same regimen on 02/04/2014.

## 2014-02-03 NOTE — Assessment & Plan Note (Addendum)
Patient is complaining of significant diarrhea episodes for the past several days.  She presented to the emergency department twice over the weekend for the same complaint.  She was given IV fluid rehydration at that time as well.  Patient states that she has been taking Imodium frequently with minimal effectiveness.   Patient states that she has had diarrhea episodes like this in the past when she came in contact with eggs.  Patient states that she is highly allergic to eggs; and typically experiences significant diarrhea when she consumes any type of eggs whatsoever.  She states that she did accidentally eat some eggs that were included as an ingredient in sausage balls over the Christmas holidays.  Within 24 hours of eating the sausage balls-patient was experiencing diarrhea.  Patient was given a stool kit to take home.  Patient states she'll try to return at first thing in the morning to rule out C. difficile.  Patient was given a prescription for Lomotil to try; and also was advised to continue to alternate with the Imodium.  Advised patient to call or return to the [place] she develops any worsening symptoms whatsoever.  Also, advised patient that she may very well need additional IV fluid rehydration; and she was encouraged to call if she continues feeling dehydrated.

## 2014-02-04 ENCOUNTER — Telehealth: Payer: Self-pay | Admitting: *Deleted

## 2014-02-04 ENCOUNTER — Encounter: Payer: Self-pay | Admitting: *Deleted

## 2014-02-04 ENCOUNTER — Ambulatory Visit: Payer: No Typology Code available for payment source | Admitting: Nutrition

## 2014-02-04 ENCOUNTER — Ambulatory Visit (HOSPITAL_BASED_OUTPATIENT_CLINIC_OR_DEPARTMENT_OTHER): Payer: No Typology Code available for payment source | Admitting: Nurse Practitioner

## 2014-02-04 ENCOUNTER — Other Ambulatory Visit (HOSPITAL_BASED_OUTPATIENT_CLINIC_OR_DEPARTMENT_OTHER): Payer: No Typology Code available for payment source

## 2014-02-04 ENCOUNTER — Ambulatory Visit (HOSPITAL_BASED_OUTPATIENT_CLINIC_OR_DEPARTMENT_OTHER): Payer: No Typology Code available for payment source

## 2014-02-04 VITALS — BP 115/73 | HR 85 | Temp 98.4°F | Resp 20 | Ht 66.0 in | Wt 172.3 lb

## 2014-02-04 DIAGNOSIS — D6959 Other secondary thrombocytopenia: Secondary | ICD-10-CM

## 2014-02-04 DIAGNOSIS — C182 Malignant neoplasm of ascending colon: Secondary | ICD-10-CM

## 2014-02-04 DIAGNOSIS — Z5111 Encounter for antineoplastic chemotherapy: Secondary | ICD-10-CM

## 2014-02-04 DIAGNOSIS — D509 Iron deficiency anemia, unspecified: Secondary | ICD-10-CM

## 2014-02-04 LAB — CBC WITH DIFFERENTIAL/PLATELET
BASO%: 0.5 % (ref 0.0–2.0)
BASOS ABS: 0 10*3/uL (ref 0.0–0.1)
EOS%: 2.8 % (ref 0.0–7.0)
Eosinophils Absolute: 0.1 10*3/uL (ref 0.0–0.5)
HCT: 39.1 % (ref 34.8–46.6)
HGB: 12.8 g/dL (ref 11.6–15.9)
LYMPH#: 0.9 10*3/uL (ref 0.9–3.3)
LYMPH%: 24.5 % (ref 14.0–49.7)
MCH: 28.8 pg (ref 25.1–34.0)
MCHC: 32.7 g/dL (ref 31.5–36.0)
MCV: 88.1 fL (ref 79.5–101.0)
MONO#: 0.2 10*3/uL (ref 0.1–0.9)
MONO%: 6.9 % (ref 0.0–14.0)
NEUT%: 65.3 % (ref 38.4–76.8)
NEUTROS ABS: 2.3 10*3/uL (ref 1.5–6.5)
Platelets: 87 10*3/uL — ABNORMAL LOW (ref 145–400)
RBC: 4.43 10*6/uL (ref 3.70–5.45)
RDW: 17.9 % — AB (ref 11.2–14.5)
WBC: 3.5 10*3/uL — AB (ref 3.9–10.3)

## 2014-02-04 LAB — COMPREHENSIVE METABOLIC PANEL (CC13)
ALT: 16 U/L (ref 0–55)
ANION GAP: 8 meq/L (ref 3–11)
AST: 21 U/L (ref 5–34)
Albumin: 3.5 g/dL (ref 3.5–5.0)
Alkaline Phosphatase: 93 U/L (ref 40–150)
BUN: 7.8 mg/dL (ref 7.0–26.0)
CO2: 28 meq/L (ref 22–29)
CREATININE: 0.8 mg/dL (ref 0.6–1.1)
Calcium: 9.2 mg/dL (ref 8.4–10.4)
Chloride: 106 mEq/L (ref 98–109)
GLUCOSE: 126 mg/dL (ref 70–140)
Potassium: 3.4 mEq/L — ABNORMAL LOW (ref 3.5–5.1)
Sodium: 142 mEq/L (ref 136–145)
Total Bilirubin: 1.03 mg/dL (ref 0.20–1.20)
Total Protein: 6.5 g/dL (ref 6.4–8.3)

## 2014-02-04 MED ORDER — LEUCOVORIN CALCIUM INJECTION 350 MG
400.0000 mg/m2 | Freq: Once | INTRAVENOUS | Status: AC
  Administered 2014-02-04: 772 mg via INTRAVENOUS
  Filled 2014-02-04: qty 38.6

## 2014-02-04 MED ORDER — DEXAMETHASONE SODIUM PHOSPHATE 10 MG/ML IJ SOLN
INTRAMUSCULAR | Status: AC
  Filled 2014-02-04: qty 1

## 2014-02-04 MED ORDER — FLUOROURACIL CHEMO INJECTION 2.5 GM/50ML
400.0000 mg/m2 | Freq: Once | INTRAVENOUS | Status: AC
  Administered 2014-02-04: 750 mg via INTRAVENOUS
  Filled 2014-02-04: qty 15

## 2014-02-04 MED ORDER — ONDANSETRON 8 MG/NS 50 ML IVPB
INTRAVENOUS | Status: AC
  Filled 2014-02-04: qty 8

## 2014-02-04 MED ORDER — OXALIPLATIN CHEMO INJECTION 100 MG/20ML
85.0000 mg/m2 | Freq: Once | INTRAVENOUS | Status: AC
  Administered 2014-02-04: 165 mg via INTRAVENOUS
  Filled 2014-02-04: qty 33

## 2014-02-04 MED ORDER — ONDANSETRON 8 MG/50ML IVPB (CHCC)
8.0000 mg | Freq: Once | INTRAVENOUS | Status: AC
  Administered 2014-02-04: 8 mg via INTRAVENOUS

## 2014-02-04 MED ORDER — SODIUM CHLORIDE 0.9 % IV SOLN
2400.0000 mg/m2 | INTRAVENOUS | Status: DC
  Administered 2014-02-04: 4650 mg via INTRAVENOUS
  Filled 2014-02-04: qty 93

## 2014-02-04 MED ORDER — DEXAMETHASONE SODIUM PHOSPHATE 10 MG/ML IJ SOLN
10.0000 mg | Freq: Once | INTRAMUSCULAR | Status: AC
  Administered 2014-02-04: 10 mg via INTRAVENOUS

## 2014-02-04 MED ORDER — DEXTROSE 5 % IV SOLN
Freq: Once | INTRAVENOUS | Status: AC
  Administered 2014-02-04: 11:00:00 via INTRAVENOUS

## 2014-02-04 NOTE — Progress Notes (Signed)
  [place] OFFICE PROGRESS NOTE   Diagnosis:  Colon cancer  INTERVAL HISTORY:   [name] returns as scheduled. She completed cycle 10 FOLFOX on 01/21/2014. She denies nausea/vomiting/diarrhea around the time of chemotherapy. No mouth sores. No hand or foot pain or redness. She denies any numbness or tingling in her hands or feet. No bleeding. She denies pain. She continues to have a good appetite.  She recently had diarrhea. She has had no loose stools since 02/02/2014. She believes the diarrhea was related to eating eggs over the holidays. She has had very similar occurrences in the past after eating eggs.  Objective:  Vital signs in last 24 hours:  Blood pressure 115/73, pulse 85, temperature 98.4 F (36.9 C), temperature source Oral, resp. rate 20, height _0  (1.676 m), weight 172 lb 4.8 oz (78.155 kg).    HEENT: No thrush or ulcers. Resp: Lungs clear bilaterally. Cardio: Regular rate and rhythm. GI: Abdomen soft and nontender. No hepatomegaly. Vascular: No leg edema. Calves soft and nontender. Neuro: Vibratory sense minimally decreased over the fingertips per tuning fork exam.  Port-A-Cath without erythema.    Lab Results:  Lab Results  Component Value Date   WBC 3.5* 02/04/2014   HGB 12.8 02/04/2014   HCT 39.1 02/04/2014   MCV 88.1 02/04/2014   PLT 87* 02/04/2014   NEUTROABS 2.3 02/04/2014    Imaging:  No results found.  Medications: I have reviewed the patient's current medications.  Assessment/Plan: 1. 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  The tumor returned microsatellite stable with no loss of mismatch repair protein expression   Cycle 1 adjuvant FOLFOX 09/08/2013   Cycle 2 adjuvant FOLFOX 09/24/2013   Cycle 3 adjuvant FOLFOX 10/08/2013.   Cycle 4 adjuvant FOLFOX 10/22/2013.   Cycle 5 adjuvant FOLFOX 11/05/2013.  Oxaliplatin held due to thrombocytopenia.  Cycle 6 FOLFOX 11/19/2013.  Cycle 7 FOLFOX 12/03/2013. Oxaliplatin held secondary to thrombocytopenia.  Cycle 8 FOLFOX 12/17/2013.  Cycle 9 FOLFOX 01/04/2014. Oxaliplatin held secondary to neutropenia.   Cycle 10 FOLFOX 01/21/2014. Oxaliplatin held secondary to thrombocytopenia.  Cycle 11 FOLFOX 02/04/2014. 2. iron deficiency anemia  3. seizure disorder  4. history of depression  5. 4 mm right lower lobe nodule on a staging chest CT 09/08/2013  6. Hospitalization 09/24/2013 through 09/26/2013 with fever and abdominal pain.  7. 09/24/2013 urine culture positive for coag negative staph.  8. Thrombocytopenia secondary to chemotherapy 11/05/2013. Oxaliplatin was held with treatment on 11/05/2013 and 12/03/2013. 9. Neutropenia secondary to chemotherapy 01/04/2014-oxaliplatin was held with cycle 9 FOLFOX and she received Neulasta support.   Disposition: [name] appears stable. She has completed 10 cycles of FOLFOX. Plan to proceed with cycle 11 today as scheduled.  The platelet count is decreased on labs today. I reviewed her labs with Dr. Marin Olp. He agrees with proceeding as planned. She understands to contact the office with any bleeding.  She will return for a follow-up visit and cycle 12 in 2 weeks. She will contact the office in the interim as outlined above or with any other problems.    Ned Card ANP/GNP-BC   02/04/2014  10:25 AM

## 2014-02-04 NOTE — Progress Notes (Signed)
Follow-up with patient requested per Selena Lesser. Patient seen today by Ned Card.

## 2014-02-04 NOTE — Patient Instructions (Signed)
[facility] Discharge Instructions for Patients Receiving Chemotherapy  Today you received the following chemotherapy agents:  Oxaliplatin, Leucovorin and 5FU  To help prevent nausea and vomiting after your treatment, we encourage you to take your nausea medication as ordered per MD.   If you develop nausea and vomiting that is not controlled by your nausea medication, call the clinic.   BELOW ARE SYMPTOMS THAT SHOULD BE REPORTED IMMEDIATELY:  *FEVER GREATER THAN 100.5 F  *CHILLS WITH OR WITHOUT FEVER  NAUSEA AND VOMITING THAT IS NOT CONTROLLED WITH YOUR NAUSEA MEDICATION  *UNUSUAL SHORTNESS OF BREATH  *UNUSUAL BRUISING OR BLEEDING  TENDERNESS IN MOUTH AND THROAT WITH OR WITHOUT PRESENCE OF ULCERS  *URINARY PROBLEMS  *BOWEL PROBLEMS  UNUSUAL RASH Items with * indicate a potential emergency and should be followed up as soon as possible.  Feel free to call the clinic you have any questions or concerns. The clinic phone number is [phone].

## 2014-02-04 NOTE — Progress Notes (Signed)
Brief follow-up completed with patient in chemotherapy. She is treated for colon cancer. Weight is relatively stable and documented as 172.3 pounds December 31. Noted weight of 171.9 pounds November 30. Patient has no nutrition issues today.  Nutrition diagnosis: Unintended weight loss resolved.  Enforced importance of adequate nutrition to continue healing.  Encouraged patient to contact me for any questions or concerns.  **Disclaimer: This note was dictated with voice recognition software. Similar sounding words can inadvertently be transcribed and this note may contain transcription errors which may not have been corrected upon publication of note.**

## 2014-02-06 ENCOUNTER — Ambulatory Visit (HOSPITAL_BASED_OUTPATIENT_CLINIC_OR_DEPARTMENT_OTHER): Payer: Medicaid Other

## 2014-02-06 ENCOUNTER — Ambulatory Visit: Payer: No Typology Code available for payment source

## 2014-02-06 DIAGNOSIS — Z5189 Encounter for other specified aftercare: Secondary | ICD-10-CM

## 2014-02-06 DIAGNOSIS — Z452 Encounter for adjustment and management of vascular access device: Secondary | ICD-10-CM

## 2014-02-06 DIAGNOSIS — C182 Malignant neoplasm of ascending colon: Secondary | ICD-10-CM

## 2014-02-06 MED ORDER — SODIUM CHLORIDE 0.9 % IJ SOLN
10.0000 mL | INTRAMUSCULAR | Status: DC | PRN
  Administered 2014-02-06: 10 mL
  Filled 2014-02-06: qty 10

## 2014-02-06 MED ORDER — HEPARIN SOD (PORK) LOCK FLUSH 100 UNIT/ML IV SOLN
500.0000 [IU] | Freq: Once | INTRAVENOUS | Status: AC | PRN
  Administered 2014-02-06: 500 [IU]
  Filled 2014-02-06: qty 5

## 2014-02-06 MED ORDER — PEGFILGRASTIM INJECTION 6 MG/0.6ML ~~LOC~~
6.0000 mg | PREFILLED_SYRINGE | Freq: Once | SUBCUTANEOUS | Status: AC
  Administered 2014-02-06: 6 mg via SUBCUTANEOUS

## 2014-02-06 NOTE — Progress Notes (Signed)
neulasta injection given 6mg  in right arm

## 2014-02-14 ENCOUNTER — Other Ambulatory Visit: Payer: Self-pay | Admitting: Oncology

## 2014-02-18 ENCOUNTER — Other Ambulatory Visit (HOSPITAL_BASED_OUTPATIENT_CLINIC_OR_DEPARTMENT_OTHER): Payer: Medicaid Other

## 2014-02-18 ENCOUNTER — Telehealth: Payer: Self-pay | Admitting: Oncology

## 2014-02-18 ENCOUNTER — Other Ambulatory Visit: Payer: Self-pay | Admitting: Nurse Practitioner

## 2014-02-18 ENCOUNTER — Encounter: Payer: Self-pay | Admitting: Nutrition

## 2014-02-18 ENCOUNTER — Ambulatory Visit (HOSPITAL_BASED_OUTPATIENT_CLINIC_OR_DEPARTMENT_OTHER): Payer: Medicaid Other

## 2014-02-18 ENCOUNTER — Ambulatory Visit (HOSPITAL_BASED_OUTPATIENT_CLINIC_OR_DEPARTMENT_OTHER): Payer: No Typology Code available for payment source | Admitting: Oncology

## 2014-02-18 VITALS — BP 123/70 | HR 92 | Temp 98.4°F | Resp 20 | Ht 66.0 in | Wt 167.0 lb

## 2014-02-18 DIAGNOSIS — Z5111 Encounter for antineoplastic chemotherapy: Secondary | ICD-10-CM

## 2014-02-18 DIAGNOSIS — C182 Malignant neoplasm of ascending colon: Secondary | ICD-10-CM

## 2014-02-18 DIAGNOSIS — D6959 Other secondary thrombocytopenia: Secondary | ICD-10-CM

## 2014-02-18 DIAGNOSIS — D509 Iron deficiency anemia, unspecified: Secondary | ICD-10-CM

## 2014-02-18 DIAGNOSIS — G620 Drug-induced polyneuropathy: Secondary | ICD-10-CM

## 2014-02-18 DIAGNOSIS — F329 Major depressive disorder, single episode, unspecified: Secondary | ICD-10-CM

## 2014-02-18 LAB — CBC WITH DIFFERENTIAL/PLATELET
BASO%: 0.9 % (ref 0.0–2.0)
Basophils Absolute: 0 10*3/uL (ref 0.0–0.1)
EOS%: 1.2 % (ref 0.0–7.0)
Eosinophils Absolute: 0.1 10*3/uL (ref 0.0–0.5)
HEMATOCRIT: 40.8 % (ref 34.8–46.6)
HGB: 13.3 g/dL (ref 11.6–15.9)
LYMPH#: 1 10*3/uL (ref 0.9–3.3)
LYMPH%: 20 % (ref 14.0–49.7)
MCH: 29.1 pg (ref 25.1–34.0)
MCHC: 32.6 g/dL (ref 31.5–36.0)
MCV: 89.3 fL (ref 79.5–101.0)
MONO#: 0.3 10*3/uL (ref 0.1–0.9)
MONO%: 6.9 % (ref 0.0–14.0)
NEUT%: 71 % (ref 38.4–76.8)
NEUTROS ABS: 3.6 10*3/uL (ref 1.5–6.5)
Platelets: 68 10*3/uL — ABNORMAL LOW (ref 145–400)
RBC: 4.57 10*6/uL (ref 3.70–5.45)
RDW: 18.1 % — ABNORMAL HIGH (ref 11.2–14.5)
WBC: 5.1 10*3/uL (ref 3.9–10.3)

## 2014-02-18 LAB — COMPREHENSIVE METABOLIC PANEL (CC13)
ALBUMIN: 3.7 g/dL (ref 3.5–5.0)
ALT: 26 U/L (ref 0–55)
ANION GAP: 8 meq/L (ref 3–11)
AST: 33 U/L (ref 5–34)
Alkaline Phosphatase: 126 U/L (ref 40–150)
BUN: 11.3 mg/dL (ref 7.0–26.0)
CHLORIDE: 106 meq/L (ref 98–109)
CO2: 25 meq/L (ref 22–29)
Calcium: 9.2 mg/dL (ref 8.4–10.4)
Creatinine: 0.8 mg/dL (ref 0.6–1.1)
EGFR: 88 mL/min/{1.73_m2} — AB (ref >90)
GLUCOSE: 139 mg/dL (ref 70–140)
Potassium: 3.9 mEq/L (ref 3.5–5.1)
SODIUM: 139 meq/L (ref 136–145)
TOTAL PROTEIN: 7 g/dL (ref 6.4–8.3)
Total Bilirubin: 1.44 mg/dL — ABNORMAL HIGH (ref 0.20–1.20)

## 2014-02-18 LAB — CEA: CEA: 5.6 ng/mL — AB (ref 0.0–5.0)

## 2014-02-18 MED ORDER — DEXTROSE 5 % IV SOLN
Freq: Once | INTRAVENOUS | Status: AC
  Administered 2014-02-18: 10:00:00 via INTRAVENOUS

## 2014-02-18 MED ORDER — LEUCOVORIN CALCIUM INJECTION 350 MG
400.0000 mg/m2 | Freq: Once | INTRAVENOUS | Status: AC
  Administered 2014-02-18: 772 mg via INTRAVENOUS
  Filled 2014-02-18: qty 38.6

## 2014-02-18 MED ORDER — HEPARIN SOD (PORK) LOCK FLUSH 100 UNIT/ML IV SOLN
500.0000 [IU] | Freq: Once | INTRAVENOUS | Status: DC | PRN
  Filled 2014-02-18: qty 5

## 2014-02-18 MED ORDER — ONDANSETRON 8 MG/50ML IVPB (CHCC)
8.0000 mg | Freq: Once | INTRAVENOUS | Status: AC
  Administered 2014-02-18: 8 mg via INTRAVENOUS

## 2014-02-18 MED ORDER — DEXAMETHASONE SODIUM PHOSPHATE 10 MG/ML IJ SOLN
10.0000 mg | Freq: Once | INTRAMUSCULAR | Status: AC
  Administered 2014-02-18: 10 mg via INTRAVENOUS

## 2014-02-18 MED ORDER — OXALIPLATIN CHEMO INJECTION 100 MG/20ML
60.0000 mg/m2 | Freq: Once | INTRAVENOUS | Status: AC
  Administered 2014-02-18: 115 mg via INTRAVENOUS
  Filled 2014-02-18: qty 23

## 2014-02-18 MED ORDER — DEXAMETHASONE SODIUM PHOSPHATE 10 MG/ML IJ SOLN
INTRAMUSCULAR | Status: AC
  Filled 2014-02-18: qty 1

## 2014-02-18 MED ORDER — ONDANSETRON 8 MG/NS 50 ML IVPB
INTRAVENOUS | Status: AC
  Filled 2014-02-18: qty 8

## 2014-02-18 MED ORDER — SODIUM CHLORIDE 0.9 % IV SOLN
2400.0000 mg/m2 | INTRAVENOUS | Status: DC
  Administered 2014-02-18: 4650 mg via INTRAVENOUS
  Filled 2014-02-18: qty 93

## 2014-02-18 MED ORDER — FLUOROURACIL CHEMO INJECTION 2.5 GM/50ML
400.0000 mg/m2 | Freq: Once | INTRAVENOUS | Status: AC
  Administered 2014-02-18: 750 mg via INTRAVENOUS
  Filled 2014-02-18: qty 15

## 2014-02-18 MED ORDER — SODIUM CHLORIDE 0.9 % IJ SOLN
10.0000 mL | INTRAMUSCULAR | Status: DC | PRN
  Filled 2014-02-18: qty 10

## 2014-02-18 NOTE — Progress Notes (Signed)
Ok to treat with platelets 67 per Dr. Benay Spice.  He is going to reduce dose.

## 2014-02-18 NOTE — Telephone Encounter (Signed)
gv adn printed appt sched and avs for pt for Jan and March

## 2014-02-18 NOTE — Progress Notes (Signed)
  [facility] OFFICE PROGRESS NOTE   Diagnosis: Colon cancer  INTERVAL HISTORY:   She completed another cycle of FOLFOX 02/04/2014. She reports nausea for 3-4 days following chemotherapy. No diarrhea. Cold sensitivity lasted for 5 days following chemotherapy. She reports mild tingling in the feet. No numbness or tingling in the fingers. The tingling does not interfere with activity.  Objective:  Vital signs in last 24 hours:  Blood pressure 123/70, pulse 92, temperature 98.4 F (36.9 C), temperature source Oral, resp. rate 20, height $RemoveBe'5\' 6"'[id number]$  (1.676 m), weight 167 lb (75.751 kg), SpO2 100 %.    HEENT: No thrush or ulcers Resp: Lungs clear bilaterally Cardio: Regular rate and rhythm GI: No hepatomegaly, nontender Vascular: No leg edema Neuro: The vibratory sense is intact at the fingertips bilaterally    Portacath/PICC-without erythema  Lab Results:  Lab Results  Component Value Date   WBC 5.1 02/18/2014   HGB 13.3 02/18/2014   HCT 40.8 02/18/2014   MCV 89.3 02/18/2014   PLT 68* 02/18/2014   NEUTROABS 3.6 02/18/2014     Medications: I have reviewed the patient's current medications.  Assessment/Plan: 1. 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  The tumor returned microsatellite stable with no loss of mismatch repair protein expression   Cycle 1 adjuvant FOLFOX 09/08/2013   Cycle 2 adjuvant FOLFOX 09/24/2013   Cycle 3 adjuvant FOLFOX 10/08/2013.   Cycle 4 adjuvant FOLFOX 10/22/2013.   Cycle 5 adjuvant FOLFOX 11/05/2013. Oxaliplatin held due to thrombocytopenia.  Cycle 6 FOLFOX 11/19/2013.  Cycle 7 FOLFOX 12/03/2013. Oxaliplatin held secondary to thrombocytopenia.  Cycle 8 FOLFOX 12/17/2013.  Cycle 9 FOLFOX 01/04/2014. Oxaliplatin held secondary to neutropenia.   Cycle 10 FOLFOX 01/21/2014. Oxaliplatin held secondary to  thrombocytopenia.  Cycle 11 FOLFOX 02/04/2014  Cycle 12 FOLFOX 02/18/2014, oxaliplatin dose reduced secondary to thrombocytopenia 2. iron deficiency anemia  3. seizure disorder  4. history of depression  5. 4 mm right lower lobe nodule on a staging chest CT 09/08/2013  6. Hospitalization 09/24/2013 through 09/26/2013 with fever and abdominal pain.  7. 09/24/2013 urine culture positive for coag negative staph.  8. 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 She has completed 11 cycles of FOLFOX. She has moderate thrombocytopenia. We discussed the risk/benefit of continuing oxaliplatin chemotherapy in this setting. She would like to complete the final cycle of chemotherapy today. She had advanced stage III disease at presentation. We decided to proceed with cycle 12 FOLFOX today with a dose reduction of the oxaliplatin. She is comfortable proceeding with oxaliplatin and will contact us for bruising or bleeding.  Ms. Durene Romans will return for a CBC in 2 weeks. She will be scheduled for an office visit and Port-A-Cath flush in 6 weeks. We will then schedule a restaging CT evaluation for June or July.  Betsy Coder, MD  02/18/2014  10:18 AM

## 2014-02-18 NOTE — Patient Instructions (Signed)
[facility] Discharge Instructions for Patients Receiving Chemotherapy  Today you received the following chemotherapy agents:  Oxaliplatin, Leucovorin and 5FU  To help prevent nausea and vomiting after your treatment, we encourage you to take your nausea medication as ordered per MD.   If you develop nausea and vomiting that is not controlled by your nausea medication, call the clinic.   BELOW ARE SYMPTOMS THAT SHOULD BE REPORTED IMMEDIATELY:  *FEVER GREATER THAN 100.5 F  *CHILLS WITH OR WITHOUT FEVER  NAUSEA AND VOMITING THAT IS NOT CONTROLLED WITH YOUR NAUSEA MEDICATION  *UNUSUAL SHORTNESS OF BREATH  *UNUSUAL BRUISING OR BLEEDING  TENDERNESS IN MOUTH AND THROAT WITH OR WITHOUT PRESENCE OF ULCERS  *URINARY PROBLEMS  *BOWEL PROBLEMS  UNUSUAL RASH Items with * indicate a potential emergency and should be followed up as soon as possible.  Feel free to call the clinic you have any questions or concerns. The clinic phone number is [phone].

## 2014-02-19 ENCOUNTER — Telehealth: Payer: Self-pay | Admitting: *Deleted

## 2014-02-19 NOTE — Telephone Encounter (Signed)
Pt called states "I've had a fever all day; just took and its 101.7 and is not coming down"  Pt states she had shaking chills this am but they went away; fever is "hanging around 101 all day even after taking Tylenol" Discussed with Dr. Benay Spice.  Per Dr. Benay Spice; notified pt to continue to watch for now; MD states this is reaction from the chemo; continue push fluids; take Tylenol. Instructions given if more shaking chills, SOB, higher fever- pt needs to go to ED this evening for evaluation.  Pt will get pump d/c 1/16 and RN can evaluate at that time as well.  Pt verbalized understanding of all instructions and verbalized back if symptoms get worse she will go to ED tonight.

## 2014-02-20 ENCOUNTER — Ambulatory Visit: Payer: Medicaid Other

## 2014-02-20 ENCOUNTER — Ambulatory Visit (HOSPITAL_BASED_OUTPATIENT_CLINIC_OR_DEPARTMENT_OTHER): Payer: Medicaid Other

## 2014-02-20 DIAGNOSIS — C182 Malignant neoplasm of ascending colon: Secondary | ICD-10-CM

## 2014-02-20 DIAGNOSIS — Z5189 Encounter for other specified aftercare: Secondary | ICD-10-CM

## 2014-02-20 MED ORDER — PEGFILGRASTIM INJECTION 6 MG/0.6ML ~~LOC~~
6.0000 mg | PREFILLED_SYRINGE | Freq: Once | SUBCUTANEOUS | Status: AC
  Administered 2014-02-20: 6 mg via SUBCUTANEOUS

## 2014-02-20 MED ORDER — HEPARIN SOD (PORK) LOCK FLUSH 100 UNIT/ML IV SOLN
500.0000 [IU] | Freq: Once | INTRAVENOUS | Status: AC | PRN
  Administered 2014-02-20: 500 [IU]
  Filled 2014-02-20: qty 5

## 2014-02-20 MED ORDER — SODIUM CHLORIDE 0.9 % IJ SOLN
10.0000 mL | INTRAMUSCULAR | Status: DC | PRN
  Administered 2014-02-20: 10 mL
  Filled 2014-02-20: qty 10

## 2014-02-20 NOTE — Patient Instructions (Signed)
Pegfilgrastim injection What is this medicine? PEGFILGRASTIM (peg fil GRA stim) is a long-acting granulocyte colony-stimulating factor that stimulates the growth of neutrophils, a type of white blood cell important in the body's fight against infection. It is used to reduce the incidence of fever and infection in patients with certain types of cancer who are receiving chemotherapy that affects the bone marrow. This medicine may be used for other purposes; ask your health care provider or pharmacist if you have questions. COMMON BRAND NAME(S): Neulasta What should I tell my health care provider before I take this medicine? They need to know if you have any of these conditions: -latex allergy -ongoing radiation therapy -sickle cell disease -skin reactions to acrylic adhesives (On-Body Injector only) -an unusual or allergic reaction to pegfilgrastim, filgrastim, other medicines, foods, dyes, or preservatives -pregnant or trying to get pregnant -breast-feeding How should I use this medicine? This medicine is for injection under the skin. If you get this medicine at home, you will be taught how to prepare and give the pre-filled syringe or how to use the On-body Injector. Refer to the patient Instructions for Use for detailed instructions. Use exactly as directed. Take your medicine at regular intervals. Do not take your medicine more often than directed. It is important that you put your used needles and syringes in a special sharps container. Do not put them in a trash can. If you do not have a sharps container, call your pharmacist or healthcare provider to get one. Talk to your pediatrician regarding the use of this medicine in children. Special care may be needed. Overdosage: If you think you have taken too much of this medicine contact a poison control center or emergency room at once. NOTE: This medicine is only for you. Do not share this medicine with others. What if I miss a dose? It is  important not to miss your dose. Call your doctor or health care professional if you miss your dose. If you miss a dose due to an On-body Injector failure or leakage, a new dose should be administered as soon as possible using a single prefilled syringe for manual use. What may interact with this medicine? Interactions have not been studied. Give your health care provider a list of all the medicines, herbs, non-prescription drugs, or dietary supplements you use. Also tell them if you smoke, drink alcohol, or use illegal drugs. Some items may interact with your medicine. This list may not describe all possible interactions. Give your health care provider a list of all the medicines, herbs, non-prescription drugs, or dietary supplements you use. Also tell them if you smoke, drink alcohol, or use illegal drugs. Some items may interact with your medicine. What should I watch for while using this medicine? You may need blood work done while you are taking this medicine. If you are going to need a MRI, CT scan, or other procedure, tell your doctor that you are using this medicine (On-Body Injector only). What side effects may I notice from receiving this medicine? Side effects that you should report to your doctor or health care professional as soon as possible: -allergic reactions like skin rash, itching or hives, swelling of the face, lips, or tongue -dizziness -fever -pain, redness, or irritation at site where injected -pinpoint red spots on the skin -shortness of breath or breathing problems -stomach or side pain, or pain at the shoulder -swelling -tiredness -trouble passing urine Side effects that usually do not require medical attention (report to your doctor   or health care professional if they continue or are bothersome): -bone pain -muscle pain This list may not describe all possible side effects. Call your doctor for medical advice about side effects. You may report side effects to FDA at  1-800-FDA-1088. Where should I keep my medicine? Keep out of the reach of children. Store pre-filled syringes in a refrigerator between 2 and 8 degrees C (36 and 46 degrees F). Do not freeze. Keep in carton to protect from light. Throw away this medicine if it is left out of the refrigerator for more than 48 hours. Throw away any unused medicine after the expiration date. NOTE: This sheet is a summary. It may not cover all possible information. If you have questions about this medicine, talk to your doctor, pharmacist, or health care provider.  2015, Elsevier/Gold Standard. (2013-04-23 16:14:05)  

## 2014-02-20 NOTE — Progress Notes (Signed)
Injection given at the same appointment as pump D/C.

## 2014-02-22 ENCOUNTER — Encounter: Payer: Self-pay | Admitting: Oncology

## 2014-02-26 ENCOUNTER — Encounter: Payer: Self-pay | Admitting: *Deleted

## 2014-03-01 ENCOUNTER — Telehealth: Payer: Self-pay | Admitting: *Deleted

## 2014-03-01 NOTE — Telephone Encounter (Signed)
Left message on voicemail informing pt her letter is available to be picked up at front desk.

## 2014-03-02 ENCOUNTER — Telehealth: Payer: Self-pay | Admitting: *Deleted

## 2014-03-02 ENCOUNTER — Encounter: Payer: Self-pay | Admitting: *Deleted

## 2014-03-02 ENCOUNTER — Other Ambulatory Visit (HOSPITAL_BASED_OUTPATIENT_CLINIC_OR_DEPARTMENT_OTHER): Payer: Medicaid Other

## 2014-03-02 DIAGNOSIS — C182 Malignant neoplasm of ascending colon: Secondary | ICD-10-CM

## 2014-03-02 LAB — CBC WITH DIFFERENTIAL/PLATELET
BASO%: 0.4 % (ref 0.0–2.0)
BASOS ABS: 0 10*3/uL (ref 0.0–0.1)
EOS ABS: 0 10*3/uL (ref 0.0–0.5)
EOS%: 0.7 % (ref 0.0–7.0)
HCT: 40.5 % (ref 34.8–46.6)
HEMOGLOBIN: 13.5 g/dL (ref 11.6–15.9)
LYMPH#: 1.1 10*3/uL (ref 0.9–3.3)
LYMPH%: 20.5 % (ref 14.0–49.7)
MCH: 29.9 pg (ref 25.1–34.0)
MCHC: 33.3 g/dL (ref 31.5–36.0)
MCV: 89.8 fL (ref 79.5–101.0)
MONO#: 0.4 10*3/uL (ref 0.1–0.9)
MONO%: 7.7 % (ref 0.0–14.0)
NEUT#: 3.9 10*3/uL (ref 1.5–6.5)
NEUT%: 70.7 % (ref 38.4–76.8)
Platelets: 71 10*3/uL — ABNORMAL LOW (ref 145–400)
RBC: 4.51 10*6/uL (ref 3.70–5.45)
RDW: 16.6 % — ABNORMAL HIGH (ref 11.2–14.5)
WBC: 5.5 10*3/uL (ref 3.9–10.3)

## 2014-03-02 LAB — CEA: CEA: 5.9 ng/mL — ABNORMAL HIGH (ref 0.0–5.0)

## 2014-03-02 NOTE — Telephone Encounter (Signed)
Message from pt reporting she needs an additional letter for her absence from school 1/14 and 02/19/14. Letter will be left at front desk for pick up.

## 2014-03-08 ENCOUNTER — Ambulatory Visit: Payer: Self-pay | Admitting: Neurology

## 2014-03-08 ENCOUNTER — Telehealth: Payer: Self-pay | Admitting: *Deleted

## 2014-03-08 NOTE — Telephone Encounter (Signed)
Per Dr. Benay Spice; left message for pt to call office re: lab results.  Per pt earlier request--did leave message that CEA is stable, slightly elevated; question 'are you smoking'  and MD would like to order CT prior to 04/09/14 office visit.  Requested pt to call office for more discussion.

## 2014-03-09 ENCOUNTER — Telehealth: Payer: Self-pay | Admitting: *Deleted

## 2014-03-09 ENCOUNTER — Telehealth: Payer: Self-pay | Admitting: Neurology

## 2014-03-09 NOTE — Telephone Encounter (Signed)
Pt no showed 03/08/14 appt w/ Dr. Tomi Likens. No show letter + policy mailed to pt / Sherri S.

## 2014-03-09 NOTE — Telephone Encounter (Signed)
Message received per VM in [place] from pt stating " I am returning a call to Lattie Haw "  Return call number given as [phone].  Noted prior entry per RN at MD desk.  THIS NOTE WILL BE SENT TO RN AT MD DESK FOR FOLLOW UP.

## 2014-03-10 ENCOUNTER — Encounter: Payer: Self-pay | Admitting: Nurse Practitioner

## 2014-03-10 ENCOUNTER — Encounter: Payer: Self-pay | Admitting: Neurology

## 2014-03-11 ENCOUNTER — Other Ambulatory Visit: Payer: Self-pay | Admitting: *Deleted

## 2014-03-11 DIAGNOSIS — C182 Malignant neoplasm of ascending colon: Secondary | ICD-10-CM

## 2014-03-22 ENCOUNTER — Telehealth: Payer: Self-pay | Admitting: Nurse Practitioner

## 2014-03-22 NOTE — Telephone Encounter (Signed)
I spoke with [name] to confirm she had received our earlier message regarding the mild elevation of the CEA and Dr. Gearldine Shown recommendation for CT scans prior to her visit in March. She did receive the message. She is scheduled for CT scans on 04/07/2014 and a follow-up visit with Dr. Benay Spice on 04/09/2014.

## 2014-04-02 ENCOUNTER — Telehealth: Payer: Self-pay | Admitting: *Deleted

## 2014-04-02 NOTE — Telephone Encounter (Signed)
Per Dr. Benay Spice: Cancel CT Abd/Pelvis. Chest CT only prior to office visit. Notified managed care and central scheduling.

## 2014-04-07 ENCOUNTER — Ambulatory Visit: Payer: Medicaid Other

## 2014-04-07 ENCOUNTER — Other Ambulatory Visit (HOSPITAL_BASED_OUTPATIENT_CLINIC_OR_DEPARTMENT_OTHER): Payer: Medicaid Other

## 2014-04-07 ENCOUNTER — Ambulatory Visit (HOSPITAL_COMMUNITY)
Admission: RE | Admit: 2014-04-07 | Discharge: 2014-04-07 | Disposition: A | Discharge status: Home / Self Care | Payer: Medicaid Other | Source: Ambulatory Visit | Attending: Oncology | Admitting: Oncology

## 2014-04-07 ENCOUNTER — Encounter (HOSPITAL_COMMUNITY): Payer: Self-pay

## 2014-04-07 VITALS — BP 109/64 | HR 70 | Temp 98.1°F

## 2014-04-07 DIAGNOSIS — C182 Malignant neoplasm of ascending colon: Secondary | ICD-10-CM

## 2014-04-07 DIAGNOSIS — C189 Malignant neoplasm of colon, unspecified: Secondary | ICD-10-CM | POA: Insufficient documentation

## 2014-04-07 DIAGNOSIS — Z95828 Presence of other vascular implants and grafts: Secondary | ICD-10-CM

## 2014-04-07 LAB — CBC WITH DIFFERENTIAL/PLATELET
BASO%: 0.6 % (ref 0.0–2.0)
Basophils Absolute: 0 10*3/uL (ref 0.0–0.1)
EOS ABS: 0.1 10*3/uL (ref 0.0–0.5)
EOS%: 2.7 % (ref 0.0–7.0)
HCT: 38.4 % (ref 34.8–46.6)
HEMOGLOBIN: 12.7 g/dL (ref 11.6–15.9)
LYMPH%: 26.8 % (ref 14.0–49.7)
MCH: 29.1 pg (ref 25.1–34.0)
MCHC: 33.1 g/dL (ref 31.5–36.0)
MCV: 88 fL (ref 79.5–101.0)
MONO#: 0.3 10*3/uL (ref 0.1–0.9)
MONO%: 8.7 % (ref 0.0–14.0)
NEUT%: 61.2 % (ref 38.4–76.8)
NEUTROS ABS: 2.4 10*3/uL (ref 1.5–6.5)
PLATELETS: 87 10*3/uL — AB (ref 145–400)
RBC: 4.36 10*6/uL (ref 3.70–5.45)
RDW: 14.1 % (ref 11.2–14.5)
WBC: 3.9 10*3/uL (ref 3.9–10.3)
lymph#: 1 10*3/uL (ref 0.9–3.3)

## 2014-04-07 LAB — COMPREHENSIVE METABOLIC PANEL (CC13)
ALK PHOS: 98 U/L (ref 40–150)
ALT: 18 U/L (ref 0–55)
AST: 32 U/L (ref 5–34)
Albumin: 3.5 g/dL (ref 3.5–5.0)
Anion Gap: 8 mEq/L (ref 3–11)
BUN: 10.9 mg/dL (ref 7.0–26.0)
CO2: 23 meq/L (ref 22–29)
Calcium: 9.3 mg/dL (ref 8.4–10.4)
Chloride: 106 mEq/L (ref 98–109)
Creatinine: 0.7 mg/dL (ref 0.6–1.1)
Glucose: 112 mg/dl (ref 70–140)
Potassium: 3.9 mEq/L (ref 3.5–5.1)
Sodium: 138 mEq/L (ref 136–145)
Total Bilirubin: 1.17 mg/dL (ref 0.20–1.20)
Total Protein: 6.4 g/dL (ref 6.4–8.3)

## 2014-04-07 MED ORDER — HEPARIN SOD (PORK) LOCK FLUSH 100 UNIT/ML IV SOLN
500.0000 [IU] | Freq: Once | INTRAVENOUS | Status: AC
  Administered 2014-04-07: 500 [IU] via INTRAVENOUS
  Filled 2014-04-07: qty 5

## 2014-04-07 MED ORDER — SODIUM CHLORIDE 0.9 % IJ SOLN
10.0000 mL | INTRAMUSCULAR | Status: DC | PRN
  Administered 2014-04-07: 10 mL via INTRAVENOUS
  Filled 2014-04-07: qty 10

## 2014-04-07 MED ORDER — IOHEXOL 300 MG/ML  SOLN
80.0000 mL | Freq: Once | INTRAMUSCULAR | Status: AC | PRN
  Administered 2014-04-07: 80 mL via INTRAVENOUS

## 2014-04-07 NOTE — Patient Instructions (Signed)

## 2014-04-09 ENCOUNTER — Ambulatory Visit (HOSPITAL_BASED_OUTPATIENT_CLINIC_OR_DEPARTMENT_OTHER): Payer: Medicaid Other | Admitting: Oncology

## 2014-04-09 ENCOUNTER — Telehealth: Payer: Self-pay | Admitting: Oncology

## 2014-04-09 VITALS — BP 133/70 | HR 69 | Temp 98.4°F | Resp 18 | Ht 66.0 in | Wt 175.4 lb

## 2014-04-09 DIAGNOSIS — G620 Drug-induced polyneuropathy: Secondary | ICD-10-CM

## 2014-04-09 DIAGNOSIS — C182 Malignant neoplasm of ascending colon: Secondary | ICD-10-CM

## 2014-04-09 DIAGNOSIS — R970 Elevated carcinoembryonic antigen [CEA]: Secondary | ICD-10-CM

## 2014-04-09 DIAGNOSIS — D696 Thrombocytopenia, unspecified: Secondary | ICD-10-CM

## 2014-04-09 NOTE — Telephone Encounter (Signed)
Gave avs & calendar for April. °

## 2014-04-09 NOTE — Progress Notes (Signed)
[facility] OFFICE PROGRESS NOTE   Diagnosis: Colon cancer  INTERVAL HISTORY:   Ms. to reports feeling well. Good appetite. Mild numbness in the feet that does not interfere with activity. No hand numbness. No fever or night sweats. She had an acute episode of nausea/vomiting and diarrhea in late December after eating eggs. A CT of the abdomen and pelvis 01/30/2014 found the liver to be normal. The spleen was enlarged. No focal lesions. No evidence of recurrent or metastatic colon cancer.  Objective:  Vital signs in last 24 hours:  Blood pressure 133/70, pulse 98, height _0  (1.676 m), weight 175 lb 6.4 oz (79.561 kg).    HEENT: Neck without mass Lymphatics: No cervical, supra-clavicular, axillary, or inguinal nodes Resp: Lungs clear bilaterally Cardio: Regular rate and rhythm GI: No hepatosplenomegaly, nontender, a spleen tip is palpable in the left subcostal region Vascular: No leg edema  Portacath/PICC-without erythema  Lab Results:  Lab Results  Component Value Date   WBC 3.9 04/07/2014   HGB 12.7 04/07/2014   HCT 38.4 04/07/2014   MCV 88.0 04/07/2014   PLT 87* 04/07/2014   NEUTROABS 2.4 04/07/2014    Lab Results  Component Value Date   CEA 5.9* 03/02/2014    Imaging:  Ct Chest W Contrast  04/07/2014   CLINICAL DATA:  Colon cancer  EXAM: CT CHEST WITH CONTRAST  TECHNIQUE: Multidetector CT imaging of the chest was performed during intravenous contrast administration.  CONTRAST:  85m OMNIPAQUE IOHEXOL 300 MG/ML  SOLN  COMPARISON:  09/08/2013  FINDINGS: Mediastinum: The heart size appears normal. There is no pericardial effusion identified. The trachea is patent and midline. Normal appearance of the esophagus. No mediastinal or hilar adenopathy identified.  Lungs/Pleura: There is no pleural effusion. No airspace consolidation or atelectasis noted. Index right lower lobe pulmonary nodule measures 3 mm, image 30/series 5. This is unchanged from previous  exam. No additional pulmonary nodules identified.  Upper Abdomen: Limited imaging within the upper abdomen is negative for acute findings. Enlarged spleen is partially visualized. No acute findings noted.  Musculoskeletal: Review of the visualized osseous structures is on unremarkable. There are no aggressive lytic or sclerotic bone lesions identified.  IMPRESSION: 1. No acute cardiopulmonary abnormalities. No specific features identified to suggest metastatic disease. 2. Stable right lower lobe pulmonary nodule measuring 3 mm.   Electronically Signed   By: TKerby MoorsM.D.   On: 04/07/2014 09:17    Medications: I have reviewed the patient's current medications.  Assessment/Plan: 1. 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  The tumor returned microsatellite stable with no loss of mismatch repair protein expression   Cycle 1 adjuvant FOLFOX 09/08/2013   Cycle 2 adjuvant FOLFOX 09/24/2013   Cycle 3 adjuvant FOLFOX 10/08/2013.   Cycle 4 adjuvant FOLFOX 10/22/2013.   Cycle 5 adjuvant FOLFOX 11/05/2013. Oxaliplatin held due to thrombocytopenia.  Cycle 6 FOLFOX 11/19/2013.  Cycle 7 FOLFOX 12/03/2013. Oxaliplatin held secondary to thrombocytopenia.  Cycle 8 FOLFOX 12/17/2013.  Cycle 9 FOLFOX 01/04/2014. Oxaliplatin held secondary to neutropenia.   Cycle 10 FOLFOX 01/21/2014. Oxaliplatin held secondary to thrombocytopenia.  Cycle 11 FOLFOX 02/04/2014  Cycle 12 FOLFOX 02/18/2014, oxaliplatin dose reduced secondary to thrombocytopenia  CT abdomen/pelvis 01/30/2014 revealed splenomegaly and no evidence of recurrent colon cancer  CT chest 04/07/2014 with a stable right lower lobe nodule and no evidence for metastatic disease  Mild elevation of the CEA beginning January 2016  2. History of iron deficiency anemia  3. seizure disorder  4. history of depression  5. 4 mm right  lower lobe nodule on a staging chest CT 09/08/2013 , stable on a CT 04/07/2014 6. Hospitalization 09/24/2013 through 09/26/2013 with fever and abdominal pain.  7. 09/24/2013 urine culture positive for coag negative staph.  8. 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 Splenomegaly noted on a CT scan 01/30/2014   Disposition:  Ms. Durene Romans remains in clinical remission from colon cancer. The CEA is mildly elevated. There is no clear explanation for the elevated CEA. No clinical evidence of recurrent colon cancer. She will return for a Port-A-Cath flush and CEA in 6 weeks. We will consider obtaining a PET scan or colonoscopy if the CEA remains elevated.  She has CT and clinical evidence of splenomegaly. This may be related to liver toxicity from chemotherapy. She has no clinical evidence for a primary hematologic condition such as lymphoma. The mild persistent thrombocytopenia is most likely related to chemotherapy, but it is possible she is developing a primary hematologic condition such as ITP or low-grade lymphoma.  Ms. Durene Romans will return for an office and lab visit with a Port-A-Cath flush in 6 weeks.  Betsy Coder, MD  04/09/2014  9:09 AM

## 2014-05-19 ENCOUNTER — Other Ambulatory Visit (HOSPITAL_BASED_OUTPATIENT_CLINIC_OR_DEPARTMENT_OTHER): Payer: Medicaid Other

## 2014-05-19 ENCOUNTER — Ambulatory Visit (HOSPITAL_BASED_OUTPATIENT_CLINIC_OR_DEPARTMENT_OTHER): Payer: Medicaid Other

## 2014-05-19 VITALS — BP 115/66 | HR 74 | Temp 98.7°F

## 2014-05-19 DIAGNOSIS — Z95828 Presence of other vascular implants and grafts: Secondary | ICD-10-CM

## 2014-05-19 DIAGNOSIS — C182 Malignant neoplasm of ascending colon: Secondary | ICD-10-CM

## 2014-05-19 LAB — CBC WITH DIFFERENTIAL/PLATELET
BASO%: 0.9 % (ref 0.0–2.0)
Basophils Absolute: 0 10*3/uL (ref 0.0–0.1)
EOS%: 1.3 % (ref 0.0–7.0)
Eosinophils Absolute: 0.1 10*3/uL (ref 0.0–0.5)
HCT: 38.1 % (ref 34.8–46.6)
HGB: 13 g/dL (ref 11.6–15.9)
LYMPH%: 27.4 % (ref 14.0–49.7)
MCH: 28.8 pg (ref 25.1–34.0)
MCHC: 34.2 g/dL (ref 31.5–36.0)
MCV: 84.3 fL (ref 79.5–101.0)
MONO#: 0.4 10*3/uL (ref 0.1–0.9)
MONO%: 8.6 % (ref 0.0–14.0)
NEUT#: 3 10*3/uL (ref 1.5–6.5)
NEUT%: 61.8 % (ref 38.4–76.8)
Platelets: 108 10*3/uL — ABNORMAL LOW (ref 145–400)
RBC: 4.52 10*6/uL (ref 3.70–5.45)
RDW: 13.3 % (ref 11.2–14.5)
WBC: 4.8 10*3/uL (ref 3.9–10.3)
lymph#: 1.3 10*3/uL (ref 0.9–3.3)

## 2014-05-19 LAB — COMPREHENSIVE METABOLIC PANEL (CC13)
ALT: 30 U/L (ref 0–55)
AST: 34 U/L (ref 5–34)
Albumin: 3.8 g/dL (ref 3.5–5.0)
Alkaline Phosphatase: 95 U/L (ref 40–150)
Anion Gap: 8 mEq/L (ref 3–11)
BUN: 11.5 mg/dL (ref 7.0–26.0)
CO2: 26 mEq/L (ref 22–29)
CREATININE: 0.7 mg/dL (ref 0.6–1.1)
Calcium: 8.9 mg/dL (ref 8.4–10.4)
Chloride: 105 mEq/L (ref 98–109)
EGFR: >90 mL/min/{1.73_m2} (ref >90)
GLUCOSE: 115 mg/dL (ref 70–140)
Potassium: 4 mEq/L (ref 3.5–5.1)
Sodium: 139 mEq/L (ref 136–145)
Total Bilirubin: 1.08 mg/dL (ref 0.20–1.20)
Total Protein: 6.8 g/dL (ref 6.4–8.3)

## 2014-05-19 LAB — CEA: CEA: 1.7 ng/mL (ref 0.0–5.0)

## 2014-05-19 MED ORDER — SODIUM CHLORIDE 0.9 % IJ SOLN
10.0000 mL | INTRAMUSCULAR | Status: DC | PRN
  Administered 2014-05-19: 10 mL via INTRAVENOUS
  Filled 2014-05-19: qty 10

## 2014-05-19 MED ORDER — HEPARIN SOD (PORK) LOCK FLUSH 100 UNIT/ML IV SOLN
500.0000 [IU] | Freq: Once | INTRAVENOUS | Status: AC
  Administered 2014-05-19: 500 [IU] via INTRAVENOUS
  Filled 2014-05-19: qty 5

## 2014-05-19 NOTE — Patient Instructions (Signed)

## 2014-05-20 ENCOUNTER — Telehealth: Payer: Self-pay | Admitting: Oncology

## 2014-05-20 ENCOUNTER — Ambulatory Visit (HOSPITAL_BASED_OUTPATIENT_CLINIC_OR_DEPARTMENT_OTHER): Payer: Medicaid Other | Admitting: Oncology

## 2014-05-20 VITALS — BP 120/63 | HR 70 | Temp 98.3°F | Resp 18 | Ht 66.0 in | Wt 178.1 lb

## 2014-05-20 DIAGNOSIS — C182 Malignant neoplasm of ascending colon: Secondary | ICD-10-CM

## 2014-05-20 DIAGNOSIS — D696 Thrombocytopenia, unspecified: Secondary | ICD-10-CM | POA: Diagnosis not present

## 2014-05-20 DIAGNOSIS — G620 Drug-induced polyneuropathy: Secondary | ICD-10-CM

## 2014-05-20 NOTE — Progress Notes (Signed)
[facility] OFFICE PROGRESS NOTE   Diagnosis: Colon cancer  INTERVAL HISTORY:   [name] returns as scheduled. She reports stiffness in the left arm since the last office visit here. She had discomfort at the Port-A-Cath site after the access earlier this week. No arm swelling or erythema. No neck pain. No weakness. Good appetite.  Objective:  Vital signs in last 24 hours:  Blood pressure 120/63, pulse 70, temperature 98.3 F (36.8 C), temperature source Oral, resp. rate 18, height _0  (1.676 m), weight 178 lb 1.6 oz (80.786 kg), SpO2 100 %.    HEENT: Neck without mass Lymphatics: No cervical, supraclavicular, or axillary nodes Resp: Lungs clear bilaterally Cardio: Regular rate and rhythm GI: No hepatosplenomegaly Vascular: No leg edema. No edema or venous engorgement at the left arm or chest. No erythema. Neuro: Good grip strength bilaterally    Portacath/PICC-without erythema, tenderness at the left Port-A-Cath and along the subcutaneous tract. No fluctuance.  Lab Results:  Lab Results  Component Value Date   WBC 4.8 05/19/2014   HGB 13.0 05/19/2014   HCT 38.1 05/19/2014   MCV 84.3 05/19/2014   PLT 108* 05/19/2014   NEUTROABS 3.0 05/19/2014      Lab Results  Component Value Date   CEA 1.7 05/19/2014    Medications: I have reviewed the patient's current medications.  Assessment/Plan: 1. 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  The tumor returned microsatellite stable with no loss of mismatch repair protein expression   Cycle 1 adjuvant FOLFOX 09/08/2013   Cycle 2 adjuvant FOLFOX 09/24/2013   Cycle 3 adjuvant FOLFOX 10/08/2013.   Cycle 4 adjuvant FOLFOX 10/22/2013.   Cycle 5 adjuvant FOLFOX 11/05/2013. Oxaliplatin held due to thrombocytopenia.  Cycle 6 FOLFOX 11/19/2013.  Cycle 7 FOLFOX 12/03/2013. Oxaliplatin held secondary  to thrombocytopenia.  Cycle 8 FOLFOX 12/17/2013.  Cycle 9 FOLFOX 01/04/2014. Oxaliplatin held secondary to neutropenia.   Cycle 10 FOLFOX 01/21/2014. Oxaliplatin held secondary to thrombocytopenia.  Cycle 11 FOLFOX 02/04/2014  Cycle 12 FOLFOX 02/18/2014, oxaliplatin dose reduced secondary to thrombocytopenia  CT abdomen/pelvis 01/30/2014 revealed splenomegaly and no evidence of recurrent colon cancer  CT chest 04/07/2014 with a stable right lower lobe nodule and no evidence for metastatic disease 2. Mild elevation of the CEA beginning January 2016 , normal on 05/19/2014  3. History of iron deficiency anemia  4. seizure disorder  5. history of depression  6. 4 mm right lower lobe nodule on a staging chest CT 09/08/2013 , stable on a CT 04/07/2014 7. Hospitalization 09/24/2013 through 09/26/2013 with fever and abdominal pain.  8. 09/24/2013 urine culture positive for coag negative staph.  9. 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 Splenomegaly noted on a CT scan 01/30/2014, the spleen is not palpable today 12. Left arm stiffness and discomfort at the Port-A-Cath site-I have a low clinical suspicion for a Port-A-Cath related DVT or infection   Disposition:  [name] is in clinical remission from colon cancer. This CEA is now normal. She will return for an office visit and CEA in 3 months. The platelet count has improved. We will check a CBC when she returns in 3 months. She reports discomfort at the Port-A-Cath site and left arm. I have a low clinical suspicion for a DVT or infection. We will ask Dr. Barry Dienes to remove the Port-A-Cath.  Betsy Coder, MD  05/20/2014  8:50 AM

## 2014-05-20 NOTE — Telephone Encounter (Signed)
Appointments made and avs printed for patient, inbox message to stephanie mosher at [facility] for port removal  anne

## 2014-05-21 ENCOUNTER — Other Ambulatory Visit: Payer: Self-pay | Admitting: General Surgery

## 2014-05-25 ENCOUNTER — Other Ambulatory Visit: Payer: Self-pay | Admitting: General Surgery

## 2014-05-26 ENCOUNTER — Telehealth: Payer: Self-pay

## 2014-05-26 NOTE — Telephone Encounter (Signed)
S/w pt that the referal to [place] surgery had been made for her port removal

## 2014-05-26 NOTE — Telephone Encounter (Signed)
Pt called asking if port removal referal was made.

## 2014-05-31 ENCOUNTER — Other Ambulatory Visit: Payer: Self-pay | Admitting: General Surgery

## 2014-06-01 NOTE — Progress Notes (Signed)
Pt called-cannot do surgery this date-to call [facility]

## 2014-06-07 ENCOUNTER — Encounter (HOSPITAL_COMMUNITY): Payer: Self-pay | Admitting: *Deleted

## 2014-06-07 ENCOUNTER — Encounter (HOSPITAL_COMMUNITY)
Admission: RE | Admit: 2014-06-07 | Discharge: 2014-06-07 | Disposition: A | Discharge status: Home / Self Care | Payer: Medicaid Other | Source: Ambulatory Visit | Attending: General Surgery | Admitting: General Surgery

## 2014-06-07 ENCOUNTER — Encounter (HOSPITAL_COMMUNITY): Payer: Self-pay

## 2014-06-07 DIAGNOSIS — Z01812 Encounter for preprocedural laboratory examination: Secondary | ICD-10-CM | POA: Insufficient documentation

## 2014-06-07 DIAGNOSIS — Z85038 Personal history of other malignant neoplasm of large intestine: Secondary | ICD-10-CM | POA: Insufficient documentation

## 2014-06-07 HISTORY — DX: Other specified behavioral and emotional disorders with onset usually occurring in childhood and adolescence: F98.8

## 2014-06-07 LAB — CBC
HEMATOCRIT: 37.2 % (ref 36.0–46.0)
HEMOGLOBIN: 12.9 g/dL (ref 12.0–15.0)
MCH: 28.9 pg (ref 26.0–34.0)
MCHC: 34.7 g/dL (ref 30.0–36.0)
MCV: 83.4 fL (ref 78.0–100.0)
Platelets: 99 10*3/uL — ABNORMAL LOW (ref 150–400)
RBC: 4.46 MIL/uL (ref 3.87–5.11)
RDW: 13.2 % (ref 11.5–15.5)
WBC: 4.7 10*3/uL (ref 4.0–10.5)

## 2014-06-07 MED ORDER — CIPROFLOXACIN IN D5W 400 MG/200ML IV SOLN
400.0000 mg | INTRAVENOUS | Status: AC
  Administered 2014-06-08: 400 mg via INTRAVENOUS
  Filled 2014-06-07: qty 200

## 2014-06-07 NOTE — Pre-Procedure Instructions (Signed)
[name]  06/07/2014   Your procedure is scheduled on:  Tuesday, Jun 08, 2014 at 7:30 AM.   Report to [facility] Entrance "A" Admitting Office at 5:30 AM.   Call this number if you have problems the morning of surgery: [phone]   Remember:   Do not eat food or drink liquids after midnight tonight, 06/07/14.   Take these medicines the morning of surgery with A SIP OF WATER: Prilosec, Hydrocodone - if needed, Ativan - if needed, Zofran - if needed   Do not wear jewelry, make-up or nail polish.  Do not wear lotions, powders, or perfumes. You may wear deodorant.  Do not shave 48 hours prior to surgery.   Do not bring valuables to the hospital.  [facility] is not responsible                  for any belongings or valuables.               Contacts, dentures or bridgework may not be worn into surgery.  Leave suitcase in the car. After surgery it may be brought to your room.  For patients admitted to the hospital, discharge time is determined by your                treatment team.               Patients discharged the day of surgery will not be allowed to drive home.    Special Instructions: [facility] - Preparing for Surgery  Before surgery, you can play an important role.  Because skin is not sterile, your skin needs to be as free of germs as possible.  You can reduce the number of germs on you skin by washing with CHG (chlorahexidine gluconate) soap before surgery.  CHG is an antiseptic cleaner which kills germs and bonds with the skin to continue killing germs even after washing.  Please DO NOT use if you have an allergy to CHG or antibacterial soaps.  If your skin becomes reddened/irritated stop using the CHG and inform your nurse when you arrive at Short Stay.  Do not shave (including legs and underarms) for at least 48 hours prior to the first CHG shower.  You may shave your face.  Please follow these instructions carefully:   1.  Shower with CHG Soap the night  before surgery and the                                morning of Surgery.  2.  If you choose to wash your hair, wash your hair first as usual with your       normal shampoo.  3.  After you shampoo, rinse your hair and body thoroughly to remove the                      Shampoo.  4.  Use CHG as you would any other liquid soap.  You can apply chg directly       to the skin and wash gently with scrungie or a clean washcloth.  5.  Apply the CHG Soap to your body ONLY FROM THE NECK DOWN.        Do not use on open wounds or open sores.  Avoid contact with your eyes, ears, mouth and genitals (private parts).  Wash genitals (private parts) with your normal soap.  6.  Wash thoroughly, paying special attention to the area where your surgery        will be performed.  7.  Thoroughly rinse your body with warm water from the neck down.  8.  DO NOT shower/wash with your normal soap after using and rinsing off       the CHG Soap.  9.  Pat yourself dry with a clean towel.            10.  Wear clean pajamas.            11.  Place clean sheets on your bed the night of your first shower and do not        sleep with pets.  Day of Surgery  Do not apply any lotions the morning of surgery.  Please wear clean clothes to the hospital.     Please read over the following fact sheets that you were given: Pain Booklet, Coughing and Deep Breathing and Surgical Site Infection Prevention

## 2014-06-07 NOTE — Progress Notes (Signed)
Anesthesia Chart Review: Patient is a 39 year old female scheduled for removal of Port-a-cath tomorrow by Dr. Barry Dienes.   History includes non-smoker, migraines, stage IIIc (T4a, N2a) colon cancer s/p partial colectomy 08/2013 and s/p chemotherapy (it looks like last dose was 02/2014), anxiety, depression, ADD, RLS, iron deficiency anemia, migraine GERD, seizures, left [place] Port-a-cath 09/2013, chemo-induced thrombocytopenia. HEM-ONC: Dr. Benay Spice. PCP is listed as Debbrah Alar, NP.  Meds include Lamictal, Ativan, Prilosec, Zofran, Requip.  09/24/13 EKG: ST at 112 bpm.  09/24/13 1V CXR: No acute cardiopulmonary disease.   Preoperative labs noted. PLT count 99K, down from 108K on 05/19/14. H/H 12.9/37.2.  Last CMET on 05/19/14 was WNL.  I think labs appear stable for this procedure.   [facility] Short Stay Center/Anesthesiology Phone [phone] 06/07/2014 2:47 PM

## 2014-06-08 ENCOUNTER — Ambulatory Visit (HOSPITAL_COMMUNITY): Payer: Medicaid Other | Admitting: Anesthesiology

## 2014-06-08 ENCOUNTER — Ambulatory Visit (HOSPITAL_COMMUNITY): Payer: Medicaid Other | Admitting: Vascular Surgery

## 2014-06-08 ENCOUNTER — Ambulatory Visit (HOSPITAL_BASED_OUTPATIENT_CLINIC_OR_DEPARTMENT_OTHER)
Admission: RE | Admit: 2014-06-08 | Discharge: 2014-06-08 | Disposition: A | Discharge status: Home / Self Care | Payer: Medicaid Other | Source: Ambulatory Visit | Attending: General Surgery | Admitting: General Surgery

## 2014-06-08 ENCOUNTER — Encounter (HOSPITAL_COMMUNITY)
Admission: RE | Disposition: A | Discharge status: Home / Self Care | Payer: Self-pay | Source: Ambulatory Visit | Attending: General Surgery

## 2014-06-08 ENCOUNTER — Encounter (HOSPITAL_COMMUNITY): Payer: Self-pay | Admitting: Surgery

## 2014-06-08 DIAGNOSIS — E876 Hypokalemia: Secondary | ICD-10-CM | POA: Insufficient documentation

## 2014-06-08 DIAGNOSIS — Z9049 Acquired absence of other specified parts of digestive tract: Secondary | ICD-10-CM | POA: Diagnosis not present

## 2014-06-08 DIAGNOSIS — K219 Gastro-esophageal reflux disease without esophagitis: Secondary | ICD-10-CM | POA: Insufficient documentation

## 2014-06-08 DIAGNOSIS — Z91048 Other nonmedicinal substance allergy status: Secondary | ICD-10-CM | POA: Insufficient documentation

## 2014-06-08 DIAGNOSIS — D509 Iron deficiency anemia, unspecified: Secondary | ICD-10-CM | POA: Insufficient documentation

## 2014-06-08 DIAGNOSIS — G2581 Restless legs syndrome: Secondary | ICD-10-CM | POA: Diagnosis not present

## 2014-06-08 DIAGNOSIS — G40909 Epilepsy, unspecified, not intractable, without status epilepticus: Secondary | ICD-10-CM | POA: Diagnosis not present

## 2014-06-08 DIAGNOSIS — Z881 Allergy status to other antibiotic agents status: Secondary | ICD-10-CM | POA: Diagnosis not present

## 2014-06-08 DIAGNOSIS — Z9851 Tubal ligation status: Secondary | ICD-10-CM | POA: Insufficient documentation

## 2014-06-08 DIAGNOSIS — Z882 Allergy status to sulfonamides status: Secondary | ICD-10-CM | POA: Diagnosis not present

## 2014-06-08 DIAGNOSIS — C189 Malignant neoplasm of colon, unspecified: Secondary | ICD-10-CM | POA: Diagnosis not present

## 2014-06-08 DIAGNOSIS — Z880 Allergy status to penicillin: Secondary | ICD-10-CM | POA: Diagnosis not present

## 2014-06-08 DIAGNOSIS — E785 Hyperlipidemia, unspecified: Secondary | ICD-10-CM | POA: Diagnosis not present

## 2014-06-08 DIAGNOSIS — Z886 Allergy status to analgesic agent status: Secondary | ICD-10-CM | POA: Insufficient documentation

## 2014-06-08 DIAGNOSIS — Z90710 Acquired absence of both cervix and uterus: Secondary | ICD-10-CM | POA: Diagnosis not present

## 2014-06-08 DIAGNOSIS — K3184 Gastroparesis: Secondary | ICD-10-CM | POA: Diagnosis not present

## 2014-06-08 DIAGNOSIS — F329 Major depressive disorder, single episode, unspecified: Secondary | ICD-10-CM | POA: Diagnosis not present

## 2014-06-08 DIAGNOSIS — F419 Anxiety disorder, unspecified: Secondary | ICD-10-CM | POA: Diagnosis not present

## 2014-06-08 DIAGNOSIS — Z91012 Allergy to eggs: Secondary | ICD-10-CM | POA: Diagnosis not present

## 2014-06-08 DIAGNOSIS — G43909 Migraine, unspecified, not intractable, without status migrainosus: Secondary | ICD-10-CM | POA: Diagnosis not present

## 2014-06-08 HISTORY — DX: Headache, unspecified: R51.9

## 2014-06-08 HISTORY — DX: Headache: R51

## 2014-06-08 HISTORY — PX: PORT-A-CATH REMOVAL: SHX5289

## 2014-06-08 SURGERY — REMOVAL PORT-A-CATH
Anesthesia: Monitor Anesthesia Care | Site: Chest | Laterality: Left

## 2014-06-08 MED ORDER — BUPIVACAINE-EPINEPHRINE (PF) 0.25% -1:200000 IJ SOLN
INTRAMUSCULAR | Status: AC
  Filled 2014-06-08: qty 30

## 2014-06-08 MED ORDER — HYDROCODONE-ACETAMINOPHEN 5-325 MG PO TABS: 1.0000 | ORAL_TABLET | ORAL | Status: DC | PRN

## 2014-06-08 MED ORDER — FENTANYL CITRATE (PF) 100 MCG/2ML IJ SOLN
25.0000 ug | INTRAMUSCULAR | Status: DC | PRN
  Administered 2014-06-08: 50 ug via INTRAVENOUS

## 2014-06-08 MED ORDER — FENTANYL CITRATE (PF) 100 MCG/2ML IJ SOLN
INTRAMUSCULAR | Status: AC
  Filled 2014-06-08: qty 2

## 2014-06-08 MED ORDER — MIDAZOLAM HCL 2 MG/2ML IJ SOLN
INTRAMUSCULAR | Status: AC
  Filled 2014-06-08: qty 2

## 2014-06-08 MED ORDER — PROPOFOL INFUSION 10 MG/ML OPTIME
INTRAVENOUS | Status: DC | PRN
  Administered 2014-06-08: 75 ug/kg/min via INTRAVENOUS

## 2014-06-08 MED ORDER — FENTANYL CITRATE (PF) 250 MCG/5ML IJ SOLN
INTRAMUSCULAR | Status: AC
  Filled 2014-06-08: qty 5

## 2014-06-08 MED ORDER — LIDOCAINE HCL (CARDIAC) 20 MG/ML IV SOLN
INTRAVENOUS | Status: AC
  Filled 2014-06-08: qty 5

## 2014-06-08 MED ORDER — SODIUM CHLORIDE 0.9 % IJ SOLN
INTRAMUSCULAR | Status: AC
  Filled 2014-06-08: qty 10

## 2014-06-08 MED ORDER — MIDAZOLAM HCL 5 MG/5ML IJ SOLN
INTRAMUSCULAR | Status: DC | PRN
  Administered 2014-06-08: 2 mg via INTRAVENOUS

## 2014-06-08 MED ORDER — PROPOFOL 10 MG/ML IV BOLUS
INTRAVENOUS | Status: AC
  Filled 2014-06-08: qty 20

## 2014-06-08 MED ORDER — FENTANYL CITRATE (PF) 100 MCG/2ML IJ SOLN
INTRAMUSCULAR | Status: DC | PRN
  Administered 2014-06-08: 25 ug via INTRAVENOUS
  Administered 2014-06-08: 50 ug via INTRAVENOUS
  Administered 2014-06-08: 25 ug via INTRAVENOUS
  Administered 2014-06-08: 50 ug via INTRAVENOUS

## 2014-06-08 MED ORDER — PROMETHAZINE HCL 25 MG/ML IJ SOLN: 6.2500 mg | INTRAMUSCULAR | Status: DC | PRN

## 2014-06-08 MED ORDER — LIDOCAINE HCL 1 % IJ SOLN
INTRAMUSCULAR | Status: DC | PRN
  Administered 2014-06-08: 10 mL via INTRAMUSCULAR

## 2014-06-08 MED ORDER — 0.9 % SODIUM CHLORIDE (POUR BTL) OPTIME
TOPICAL | Status: DC | PRN
  Administered 2014-06-08: 1000 mL

## 2014-06-08 MED ORDER — DIPHENHYDRAMINE HCL 50 MG/ML IJ SOLN
12.5000 mg | Freq: Once | INTRAMUSCULAR | Status: AC
  Administered 2014-06-08: 12.5 mg via INTRAVENOUS

## 2014-06-08 MED ORDER — ONDANSETRON HCL 4 MG/2ML IJ SOLN
INTRAMUSCULAR | Status: AC
  Filled 2014-06-08: qty 2

## 2014-06-08 MED ORDER — CITRIC ACID-SODIUM CITRATE 334-500 MG/5ML PO SOLN
30.0000 mL | Freq: Once | ORAL | Status: AC
  Administered 2014-06-08: 30 mL via ORAL
  Filled 2014-06-08: qty 30

## 2014-06-08 MED ORDER — EPHEDRINE SULFATE 50 MG/ML IJ SOLN
INTRAMUSCULAR | Status: AC
  Filled 2014-06-08: qty 1

## 2014-06-08 MED ORDER — LACTATED RINGERS IV SOLN
INTRAVENOUS | Status: DC | PRN
  Administered 2014-06-08: 07:00:00 via INTRAVENOUS

## 2014-06-08 MED ORDER — PHENYLEPHRINE 40 MCG/ML (10ML) SYRINGE FOR IV PUSH (FOR BLOOD PRESSURE SUPPORT)
PREFILLED_SYRINGE | INTRAVENOUS | Status: AC
  Filled 2014-06-08: qty 10

## 2014-06-08 MED ORDER — LIDOCAINE HCL (CARDIAC) 20 MG/ML IV SOLN
INTRAVENOUS | Status: DC | PRN
  Administered 2014-06-08: 30 mg via INTRAVENOUS

## 2014-06-08 MED ORDER — MIDAZOLAM HCL 2 MG/2ML IJ SOLN: 0.5000 mg | Freq: Once | INTRAMUSCULAR | Status: DC | PRN

## 2014-06-08 MED ORDER — DIPHENHYDRAMINE HCL 50 MG/ML IJ SOLN
INTRAMUSCULAR | Status: AC
  Administered 2014-06-08: 12.5 mg via INTRAVENOUS
  Filled 2014-06-08: qty 1

## 2014-06-08 MED ORDER — MEPERIDINE HCL 25 MG/ML IJ SOLN: 6.2500 mg | INTRAMUSCULAR | Status: DC | PRN

## 2014-06-08 MED ORDER — GLYCOPYRROLATE 0.2 MG/ML IJ SOLN
INTRAMUSCULAR | Status: AC
  Filled 2014-06-08: qty 1

## 2014-06-08 MED ORDER — ONDANSETRON HCL 4 MG/2ML IJ SOLN
INTRAMUSCULAR | Status: DC | PRN
  Administered 2014-06-08: 4 mg via INTRAVENOUS

## 2014-06-08 MED ORDER — SUCCINYLCHOLINE CHLORIDE 20 MG/ML IJ SOLN
INTRAMUSCULAR | Status: AC
  Filled 2014-06-08: qty 1

## 2014-06-08 MED ORDER — ROCURONIUM BROMIDE 50 MG/5ML IV SOLN
INTRAVENOUS | Status: AC
  Filled 2014-06-08: qty 1

## 2014-06-08 SURGICAL SUPPLY — 31 items
CHLORAPREP W/TINT 10.5 ML (MISCELLANEOUS) ×3 IMPLANT
COVER SURGICAL LIGHT HANDLE (MISCELLANEOUS) ×3 IMPLANT
DRAPE PED LAPAROTOMY (DRAPES) ×3 IMPLANT
DRAPE UTILITY XL STRL (DRAPES) ×6 IMPLANT
ELECT CAUTERY BLADE 6.4 (BLADE) ×3 IMPLANT
ELECT REM PT RETURN 9FT ADLT (ELECTROSURGICAL) ×3
ELECTRODE REM PT RTRN 9FT ADLT (ELECTROSURGICAL) ×1 IMPLANT
GAUZE SPONGE 4X4 16PLY XRAY LF (GAUZE/BANDAGES/DRESSINGS) ×3 IMPLANT
GLOVE BIO SURGEON STRL SZ 6 (GLOVE) ×3 IMPLANT
GLOVE BIO SURGEON STRL SZ7 (GLOVE) ×2 IMPLANT
GLOVE BIOGEL PI IND STRL 6.5 (GLOVE) ×1 IMPLANT
GLOVE BIOGEL PI IND STRL 7.0 (GLOVE) IMPLANT
GLOVE BIOGEL PI INDICATOR 6.5 (GLOVE) ×2
GLOVE BIOGEL PI INDICATOR 7.0 (GLOVE) ×2
GOWN STRL REUS W/ TWL LRG LVL3 (GOWN DISPOSABLE) ×1 IMPLANT
GOWN STRL REUS W/TWL 2XL LVL3 (GOWN DISPOSABLE) ×3 IMPLANT
GOWN STRL REUS W/TWL LRG LVL3 (GOWN DISPOSABLE) ×9
KIT BASIN OR (CUSTOM PROCEDURE TRAY) ×3 IMPLANT
KIT ROOM TURNOVER OR (KITS) ×3 IMPLANT
LIQUID BAND (GAUZE/BANDAGES/DRESSINGS) ×3 IMPLANT
NDL HYPO 25GX1X1/2 BEV (NEEDLE) ×1 IMPLANT
NEEDLE HYPO 25GX1X1/2 BEV (NEEDLE) ×3 IMPLANT
NS IRRIG 1000ML POUR BTL (IV SOLUTION) ×3 IMPLANT
PACK SURGICAL SETUP 50X90 (CUSTOM PROCEDURE TRAY) ×3 IMPLANT
PAD ARMBOARD 7.5X6 YLW CONV (MISCELLANEOUS) ×6 IMPLANT
PENCIL BUTTON HOLSTER BLD 10FT (ELECTRODE) ×3 IMPLANT
SUT MON AB 4-0 PC3 18 (SUTURE) ×3 IMPLANT
SUT VIC AB 3-0 SH 27 (SUTURE) ×3
SUT VIC AB 3-0 SH 27X BRD (SUTURE) ×1 IMPLANT
SYR CONTROL 10ML LL (SYRINGE) ×3 IMPLANT
TOWEL OR 17X26 10 PK STRL BLUE (TOWEL DISPOSABLE) ×3 IMPLANT

## 2014-06-08 NOTE — Anesthesia Preprocedure Evaluation (Addendum)
Anesthesia Evaluation  Patient identified by MRN, date of birth, ID band Patient awake    Reviewed: Allergy & Precautions, NPO status , Patient's Chart, lab work & pertinent test results  History of Anesthesia Complications Negative for: history of anesthetic complications  Airway Mallampati: II  TM Distance: >3 FB Neck ROM: Full    Dental  (+) Teeth Intact, Dental Advisory Given   Pulmonary neg pulmonary ROS,  breath sounds clear to auscultation        Cardiovascular negative cardio ROS  Rhythm:Regular Rate:Normal     Neuro/Psych  Headaches, Seizures -, Well Controlled,  Anxiety Depression negative neurological ROS     GI/Hepatic Neg liver ROS, GERD-  Poorly Controlled,Colon cancer dx July 2015   Endo/Other  negative endocrine ROS  Renal/GU negative Renal ROS     Musculoskeletal negative musculoskeletal ROS (+)   Abdominal   Peds  Hematology negative hematology ROS (+)   Anesthesia Other Findings   Reproductive/Obstetrics S/p hysterectomy                          Anesthesia Physical Anesthesia Plan  ASA: III  Anesthesia Plan: MAC   Post-op Pain Management:    Induction: Intravenous  Airway Management Planned: Simple Face Mask and Natural Airway  Additional Equipment:   Intra-op Plan:   Post-operative Plan:   Informed Consent: I have reviewed the patients History and Physical, chart, labs and discussed the procedure including the risks, benefits and alternatives for the proposed anesthesia with the patient or authorized representative who has indicated his/her understanding and acceptance.   Dental advisory given  Plan Discussed with: Surgeon and CRNA  Anesthesia Plan Comments: (Plan routine monitors, MAC)        Anesthesia Quick Evaluation

## 2014-06-08 NOTE — Progress Notes (Signed)
Patient claims itching is "better".

## 2014-06-08 NOTE — Addendum Note (Signed)
Addendum  created 06/08/14 1311 by Jenne Campus, CRNA   Modules edited: Anesthesia Attestations

## 2014-06-08 NOTE — H&P (Signed)
[name] is an 39 y.o. female.   Chief Complaint: colon cancer HPI:  Pt is s/p right hemicolectomy for colon cancer.  She has completed adjuvant therapy.  No clinical evidence of disease.    Past Medical History  Diagnosis Date  . GERD (gastroesophageal reflux disease)   . Seizures     last seizure 08/2012  . History of migraine     no problems in "a long time"  . Anemia, iron deficiency   . Restless leg syndrome   . History of blood transfusion 08/16/2013  . Difficulty swallowing pills   . Colon cancer 08/2013  . Anxiety   . Depression   . Headache     migraines "long time ago"  . ADD (attention deficit disorder)     Past Surgical History  Procedure Laterality Date  . Esophagogastroduodenoscopy  07/07/2013  . Laparoscopic partial colectomy Right 08/11/2013    Procedure: LAPAROSCOPIC RIGHT HEMICOLECTOMY;  Surgeon: Stark Klein, MD;  Location: [facility];  Service: General;  Laterality: Right;  . Colonoscopy with propofol  07/07/2013  . Tubal ligation  11/07/2000  . Endometrial fulguration  11/01/2003  . Laparoscopic lysis of adhesions  11/01/2003  . Cystoscopy  11/01/2003  . Appendectomy  08/24/2004  . Abdominal hysterectomy  08/24/2004    partial  . Unilateral salpingectomy Left 08/24/2004  . Left oophorectomy  08/24/2004  . Panniculectomy  08/24/2004  . Portacath placement Left 09/09/2013    Procedure: INSERTION PORT-A-CATH;  Surgeon: Stark Klein, MD;  Location: [facility];  Service: General;  Laterality: Left;  . Colon surgery  08/11/2013    removed a foot of colon    Family History  Problem Relation Age of Onset  . Heart attack Mother   . Colon polyps Mother   . Colitis Mother   . Diabetes Father   . Heart attack Father   . Stroke Father   . Cirrhosis Maternal Aunt   . Cancer Maternal Uncle     unknown type  . Colon cancer Neg Hx   . Esophageal cancer Neg Hx   . Rectal cancer Neg Hx   . Stomach cancer Neg Hx    Social History:  reports that she has never  smoked. She has never used smokeless tobacco. She reports that she does not drink alcohol or use illicit drugs.  Allergies:  Allergies  Allergen Reactions  . Eggs Or Egg-Derived Products Nausea And Vomiting  . Metoclopramide Hcl Other (See Comments)    EXACERBATES RESTLESS LEG SYNDROME  . Oxycodone-Acetaminophen Hives  . Sulfa Antibiotics Nausea And Vomiting  . Adhesive [Tape] Hives and Rash  . Penicillins Itching    Medications Prior to Admission  Medication Sig Dispense Refill  . lamoTRIgine (LAMICTAL) 100 MG tablet Take 1 tablet (100 mg total) by mouth 2 (two) times daily. (Patient taking differently: Take 200 mg by mouth every evening. ) 60 tablet 3  . lidocaine-prilocaine (EMLA) cream Apply 1 application topically as needed. Apply 1 teaspoon to PAC site 2 hours prior to stick and cover with plastic wrap to numb site 30 g 5  . LORazepam (ATIVAN) 1 MG tablet Take 1 tablet (1 mg total) by mouth 2 (two) times daily as needed for anxiety or sleep. 60 tablet 0  . omeprazole (PRILOSEC OTC) 20 MG tablet Take 20 mg by mouth daily.     Marland Kitchen rOPINIRole (REQUIP) 4 MG tablet TAKE ONE TABLET BY MOUTH AT BEDTIME. 30 tablet 3  . Alum & Mag Hydroxide-Simeth (  MAGIC MOUTHWASH W/LIDOCAINE) SOLN Take 5 mLs by mouth 4 (four) times daily as needed for mouth pain. (Patient not taking: Reported on 04/09/2014) 120 mL 1  . diphenoxylate-atropine (LOMOTIL) 2.5-0.025 MG per tablet Take 2 tablets by mouth 4 (four) times daily as needed for diarrhea or loose stools. (Patient not taking: Reported on 02/04/2014) 30 tablet 0  . HYDROcodone-acetaminophen (NORCO/VICODIN) 5-325 MG per tablet Take 1-2 tablets by mouth every 4 (four) hours as needed. (Patient taking differently: Take 1-2 tablets by mouth every 4 (four) hours as needed for moderate pain or severe pain. ) 12 tablet 0  . ondansetron (ZOFRAN) 8 MG tablet TAKE ONE TABLET BY MOUTH EVERY 8 HOURS AS NEEDED FOR NAUSEA OR VOMITING. 20 tablet 1    Results for orders  placed or performed during the hospital encounter of 06/07/14 (from the past 48 hour(s))  CBC     Status: Abnormal   Collection Time: 06/07/14 10:06 AM  Result Value Ref Range   WBC 4.7 4.0 - 10.5 K/uL   RBC 4.46 3.87 - 5.11 MIL/uL   Hemoglobin 12.9 12.0 - 15.0 g/dL   HCT 37.2 36.0 - 46.0 %   MCV 83.4 78.0 - 100.0 fL   MCH 28.9 26.0 - 34.0 pg   MCHC 34.7 30.0 - 36.0 g/dL   RDW 13.2 11.5 - 15.5 %   Platelets 99 (L) 150 - 400 K/uL    Comment: PLATELET COUNT CONFIRMED BY SMEAR   No results found.  Review of Systems  All other systems reviewed and are negative.   Blood pressure 136/75, pulse 65, temperature 98.7 F (37.1 C), temperature source Oral, resp. rate 18, height 5\' 6"  (1.676 m), weight 78.926 kg (174 lb), SpO2 99 %. Physical Exam  Constitutional: She is oriented to person, place, and time. She appears well-developed and well-nourished. No distress.  HENT:  Head: Normocephalic and atraumatic.  Eyes: Pupils are equal, round, and reactive to light. No scleral icterus.  Neck: Normal range of motion.  Cardiovascular: Normal rate.   Respiratory: Effort normal. No respiratory distress.  GI: She exhibits no distension.  Neurological: She is alert and oriented to person, place, and time.  Skin: Skin is warm and dry. She is not diaphoretic.  Psychiatric: She has a normal mood and affect. Her behavior is normal. Judgment and thought content normal.     Assessment/Plan Colon cancer Plan removal of port a cath. Primary risks include bleeding and infection.    [place] 06/08/2014, 7:15 AM

## 2014-06-08 NOTE — Anesthesia Postprocedure Evaluation (Signed)
  Anesthesia Post-op Note  Patient: [name]  Procedure(s) Performed: Procedure(s): REMOVAL PORT-A-CATH (Left)  Patient Location: PACU  Anesthesia Type:MAC  Level of Consciousness: awake, alert , oriented and patient cooperative  Airway and Oxygen Therapy: Patient Spontanous Breathing  Post-op Pain: none  Post-op Assessment: Post-op Vital signs reviewed, Patient's Cardiovascular Status Stable, Respiratory Function Stable, Patent Airway, No signs of Nausea or vomiting and Pain level controlled  Post-op Vital Signs: Reviewed and stable  Last Vitals:  Filed Vitals:   06/08/14 1002  BP: 115/71  Pulse: 56  Temp:   Resp: 20    Complications: No apparent anesthesia complications

## 2014-06-08 NOTE — Transfer of Care (Signed)
Immediate Anesthesia Transfer of Care Note  Patient: [name]  Procedure(s) Performed: Procedure(s): REMOVAL PORT-A-CATH (Left)  Patient Location: PACU  Anesthesia Type:MAC  Level of Consciousness: awake, oriented and patient cooperative  Airway & Oxygen Therapy: Patient Spontanous Breathing and Patient connected to face mask oxygen  Post-op Assessment: Report given to RN and Post -op Vital signs reviewed and stable  Post vital signs: Reviewed  Last Vitals:  Filed Vitals:   06/08/14 0612  BP:   Pulse:   Temp: 37.1 C  Resp:     Complications: No apparent anesthesia complications

## 2014-06-08 NOTE — Progress Notes (Signed)
Report given to maria rn as caregiver 

## 2014-06-08 NOTE — Op Note (Signed)
  PRE-OPERATIVE DIAGNOSIS:  un-needed Port-A-Cath for colon cancer  POST-OPERATIVE DIAGNOSIS:  Same   PROCEDURE:  Procedure(s):  REMOVAL PORT-A-CATH  SURGEON:  Surgeon(s):  Stark Klein, MD  ANESTHESIA:   General + local  EBL:   Minimal  SPECIMEN:  None  Complications : none known  Procedure:   Pt was  identified in the holding area and taken to the operating room where she was placed supine on the operating room table.  General anesthesia was induced via LMA.  The R upper chest was prepped and draped.  The prior incision was anesthetized with local anesthetic.  The incision was opened with a #15 blade.  The subcutaneous tissue was divided with the cautery.  The port was identified and the capsule opened.  The four 2-0 prolene sutures were removed.  The port was then removed and pressure held on the tract.  The catheter appeared intact without evidence of breakage.  The wound was inspected for hemostasis, which was achieved with cautery.  The wound was closed with 3-0 vicryl deep dermal interrupted sutures and 4-0 Monocryl running subcuticular suture.  The wound was cleaned, dried, and dressed with dermabond.  The patient was awakened from anesthesia and taken to the PACU in stable condition.  Needle, sponge, and instrument counts are correct.

## 2014-06-08 NOTE — Anesthesia Procedure Notes (Signed)
Procedure Name: MAC Date/Time: 06/08/2014 8:05 AM Performed by: Jenne Campus Pre-anesthesia Checklist: Patient identified, Emergency Drugs available, Suction available, Patient being monitored and Timeout performed Patient Re-evaluated:Patient Re-evaluated prior to inductionOxygen Delivery Method: Simple face mask

## 2014-06-08 NOTE — Progress Notes (Signed)
Patient complained of itching to upper chest and back of neck, slight redness noted. Dr. Glennon Mac notified. Order received for Benadryl.

## 2014-06-08 NOTE — Discharge Instructions (Signed)
[facility] Office Phone Number [phone]   POST OP INSTRUCTIONS  Always review your discharge instruction sheet given to you by the facility where your surgery was performed.  IF YOU HAVE DISABILITY OR FAMILY LEAVE FORMS, YOU MUST BRING THEM TO THE OFFICE FOR PROCESSING.  DO NOT GIVE THEM TO YOUR DOCTOR.  1. A prescription for pain medication may be given to you upon discharge.  Take your pain medication as prescribed, if needed.  If narcotic pain medicine is not needed, then you may take acetaminophen (Tylenol) or ibuprofen (Advil) as needed. 2. Take your usually prescribed medications unless otherwise directed 3. If you need a refill on your pain medication, please contact your pharmacy.  They will contact our office to request authorization.  Prescriptions will not be filled after 5pm or on week-ends. 4. You should eat very light the first 24 hours after surgery, such as soup, crackers, pudding, etc.  Resume your normal diet the day after surgery 5. It is common to experience some constipation if taking pain medication after surgery.  Increasing fluid intake and taking a stool softener will usually help or prevent this problem from occurring.  A mild laxative (Milk of Magnesia or Miralax) should be taken according to package directions if there are no bowel movements after 48 hours. 6. You may shower in 48 hours.  The surgical glue will flake off in 2-3 weeks.   7. ACTIVITIES:  No strenuous activity or heavy lifting for 1 week.   a. You may drive when you no longer are taking prescription pain medication, you can comfortably wear a seatbelt, and you can safely maneuver your car and apply brakes. b. RETURN TO WORK:  __________as tolerated._______________ You should see your doctor in the office for a follow-up appointment approximately three-four weeks after your surgery.    WHEN TO CALL YOUR DOCTOR: 1. Fever over 101.0 2. Nausea and/or vomiting. 3. Extreme swelling or  bruising. 4. Continued bleeding from incision. 5. Increased pain, redness, or drainage from the incision.  The clinic staff is available to answer your questions during regular business hours.  Please don't hesitate to call and ask to speak to one of the nurses for clinical concerns.  If you have a medical emergency, go to the nearest emergency room or call 911.  A surgeon from [facility] is always on call at the hospital.  For further questions, please visit [email or web address]   

## 2014-06-09 ENCOUNTER — Encounter (HOSPITAL_COMMUNITY): Payer: Self-pay | Admitting: General Surgery

## 2014-06-29 ENCOUNTER — Other Ambulatory Visit: Payer: Self-pay | Admitting: Neurology

## 2014-07-08 ENCOUNTER — Encounter: Payer: Self-pay | Admitting: Internal Medicine

## 2014-07-19 ENCOUNTER — Encounter: Payer: Self-pay | Admitting: Oncology

## 2014-08-14 ENCOUNTER — Other Ambulatory Visit: Payer: Self-pay | Admitting: Neurology

## 2014-08-16 ENCOUNTER — Other Ambulatory Visit: Payer: Self-pay | Admitting: Oncology

## 2014-08-16 ENCOUNTER — Other Ambulatory Visit: Payer: Self-pay | Admitting: *Deleted

## 2014-08-16 DIAGNOSIS — C182 Malignant neoplasm of ascending colon: Secondary | ICD-10-CM

## 2014-08-16 NOTE — Telephone Encounter (Signed)
VM message received from patient @ 3:42 pm. TC to patient. She states she feels like she might have a UTI. She states she is having left lower back/flank pain, nausea, vaginal itching and darker urine, though no foul odor. Denies fever, denies dysuria. This has been going for 2 days. Pt asking for antibiotic. Informed patient that we would need a urine specimen to be sure, before treating for UTI. Not currently receiving chemo.  Advised to increase fluid intake-esp. Water and add cranberry juice. Pt voiced understanding of all instructions.  pof sent for u/a, culture for tomorrow morning. Refill for zofran escribed to [facility].

## 2014-08-17 ENCOUNTER — Ambulatory Visit (HOSPITAL_BASED_OUTPATIENT_CLINIC_OR_DEPARTMENT_OTHER): Payer: Medicaid Other

## 2014-08-17 ENCOUNTER — Telehealth: Payer: Self-pay | Admitting: Oncology

## 2014-08-17 ENCOUNTER — Telehealth: Payer: Self-pay | Admitting: *Deleted

## 2014-08-17 DIAGNOSIS — R109 Unspecified abdominal pain: Secondary | ICD-10-CM | POA: Diagnosis not present

## 2014-08-17 DIAGNOSIS — C182 Malignant neoplasm of ascending colon: Secondary | ICD-10-CM | POA: Diagnosis present

## 2014-08-17 DIAGNOSIS — D696 Thrombocytopenia, unspecified: Secondary | ICD-10-CM | POA: Diagnosis not present

## 2014-08-17 LAB — URINALYSIS, MICROSCOPIC - CHCC
Bilirubin (Urine): NEGATIVE
Blood: NEGATIVE
Glucose: NEGATIVE mg/dL
KETONES: NEGATIVE mg/dL
NITRITE: NEGATIVE
Protein: <30 mg/dL
RBC / HPF: NEGATIVE (ref 0–2)
Specific Gravity, Urine: 1.03 (ref 1.003–1.035)
Urobilinogen, UR: 0.2 mg/dL (ref 0.2–1)
pH: 6 (ref 4.6–8.0)

## 2014-08-17 LAB — CBC WITH DIFFERENTIAL/PLATELET
BASO%: 0.3 % (ref 0.0–2.0)
Basophils Absolute: 0 10*3/uL (ref 0.0–0.1)
EOS ABS: 0 10*3/uL (ref 0.0–0.5)
EOS%: 1.1 % (ref 0.0–7.0)
HEMATOCRIT: 39.1 % (ref 34.8–46.6)
HGB: 13.3 g/dL (ref 11.6–15.9)
LYMPH#: 0.9 10*3/uL (ref 0.9–3.3)
LYMPH%: 23.9 % (ref 14.0–49.7)
MCH: 28 pg (ref 25.1–34.0)
MCHC: 34 g/dL (ref 31.5–36.0)
MCV: 82.3 fL (ref 79.5–101.0)
MONO#: 0.2 10*3/uL (ref 0.1–0.9)
MONO%: 5.4 % (ref 0.0–14.0)
NEUT#: 2.6 10*3/uL (ref 1.5–6.5)
NEUT%: 69.3 % (ref 38.4–76.8)
Platelets: 107 10*3/uL — ABNORMAL LOW (ref 145–400)
RBC: 4.76 10*6/uL (ref 3.70–5.45)
RDW: 14.3 % (ref 11.2–14.5)
WBC: 3.8 10*3/uL — AB (ref 3.9–10.3)

## 2014-08-17 NOTE — Telephone Encounter (Signed)
Received call back from patient. She states she is feeling nauseated, though not vomiting, experiencing mid abd pain below her ribs, not feeling like eating. Denies fever, diarrhea or constipation. States her urine is lighter in color today. As stated earlier she did have urine specimen done today. U/a is in lab reports. Culture pending. Please advise.

## 2014-08-17 NOTE — Telephone Encounter (Signed)
This note from yesterday, 08/16/14:   "VM message received from patient @ 3:42 pm. TC to patient. She states she feels like she might have a UTI. She states she is having left lower back/flank pain, nausea, vaginal itching and darker urine, though no foul odor. Denies fever, denies dysuria. This has been going for 2 days. Pt asking for antibiotic. Informed patient that we would need a urine specimen to be sure, before treating for UTI. Not currently receiving chemo. Advised to increase fluid intake-esp. Water and add cranberry juice. Pt voiced understanding of all instructions.'

## 2014-08-17 NOTE — Telephone Encounter (Addendum)
Discussed earlier call with pt, she does not have a primary care MD. Having trouble finding one that accepts Medicaid. She tried OTC gas-x medicine for the pain. Recommended she go to urgent care if this persists. Will review with MD.

## 2014-08-17 NOTE — Telephone Encounter (Signed)
VM message received from patient @ 12:09 pm. She states that her stomach is hurting badly and is requesting a call back.  Reviewed chart and pt came in for u/a and urine culture this morning. Attempted call back x2  to her cell and home #s and left message for her to call back to [facility] triage. Will attempt call again this afternoon.

## 2014-08-17 NOTE — Telephone Encounter (Signed)
Returned call to pt, Dr. Benay Spice recommends follow up with GI if abdominal pain persists. Called pt, she voiced understanding. Reports pain is improving after taking gas medicine.

## 2014-08-17 NOTE — Telephone Encounter (Signed)
Added appt per pof...per orders pof pt aware °

## 2014-08-18 ENCOUNTER — Encounter: Payer: Self-pay | Admitting: Internal Medicine

## 2014-08-18 LAB — CEA: CEA: 5.6 ng/mL — ABNORMAL HIGH (ref 0.0–5.0)

## 2014-08-19 LAB — URINE CULTURE

## 2014-08-23 ENCOUNTER — Other Ambulatory Visit: Payer: Self-pay | Admitting: *Deleted

## 2014-08-23 DIAGNOSIS — C182 Malignant neoplasm of ascending colon: Secondary | ICD-10-CM

## 2014-08-24 ENCOUNTER — Other Ambulatory Visit (HOSPITAL_BASED_OUTPATIENT_CLINIC_OR_DEPARTMENT_OTHER): Payer: Medicaid Other

## 2014-08-24 ENCOUNTER — Ambulatory Visit (HOSPITAL_BASED_OUTPATIENT_CLINIC_OR_DEPARTMENT_OTHER): Payer: Medicaid Other | Admitting: Nurse Practitioner

## 2014-08-24 ENCOUNTER — Telehealth: Payer: Self-pay | Admitting: Nurse Practitioner

## 2014-08-24 VITALS — BP 129/88 | HR 84 | Temp 99.2°F | Resp 18 | Ht 66.0 in | Wt 175.0 lb

## 2014-08-24 DIAGNOSIS — C182 Malignant neoplasm of ascending colon: Secondary | ICD-10-CM

## 2014-08-24 DIAGNOSIS — D6959 Other secondary thrombocytopenia: Secondary | ICD-10-CM | POA: Diagnosis not present

## 2014-08-24 DIAGNOSIS — G620 Drug-induced polyneuropathy: Secondary | ICD-10-CM

## 2014-08-24 NOTE — Progress Notes (Signed)
  [place] OFFICE PROGRESS NOTE   Diagnosis:  Colon cancer  INTERVAL HISTORY:   [name] returns as scheduled. She continues to feel fatigued. Since surgery she has been having a bowel movement 10-30 minutes after eating. She denies abdominal pain. She had some nausea last week. None this week. Appetite is good. No neuropathy symptoms.  Objective:  Vital signs in last 24 hours:  Blood pressure 129/88, pulse 84, temperature 99.2 F (37.3 C), temperature source Oral, resp. rate 18, height $RemoveBe'5\' 6"'yXrVkoznk$  (1.676 m), weight 175 lb (79.379 kg), SpO2 100 %.    HEENT: No thrush or ulcers. Lymphatics: No palpable cervical, supra clavicular, axillary or inguinal lymph nodes. Resp: Lungs clear bilaterally. Cardio: Regular rate and rhythm. GI: Abdomen soft and nontender. No hepatomegaly. No mass. Vascular: No leg edema.   Lab Results:  Lab Results  Component Value Date   WBC 3.8* 08/17/2014   HGB 13.3 08/17/2014   HCT 39.1 08/17/2014   MCV 82.3 08/17/2014   PLT 107* 08/17/2014   NEUTROABS 2.6 08/17/2014   08/17/2014 CEA 5.6  Imaging:  No results found.  Medications: I have reviewed the patient's current medications.  Assessment/Plan: 1. 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  The tumor returned microsatellite stable with no loss of mismatch repair protein expression   Cycle 1 adjuvant FOLFOX 09/08/2013   Cycle 2 adjuvant FOLFOX 09/24/2013   Cycle 3 adjuvant FOLFOX 10/08/2013.   Cycle 4 adjuvant FOLFOX 10/22/2013.   Cycle 5 adjuvant FOLFOX 11/05/2013. Oxaliplatin held due to thrombocytopenia.  Cycle 6 FOLFOX 11/19/2013.  Cycle 7 FOLFOX 12/03/2013. Oxaliplatin held secondary to thrombocytopenia.  Cycle 8 FOLFOX 12/17/2013.  Cycle 9 FOLFOX 01/04/2014. Oxaliplatin held secondary to neutropenia.   Cycle 10 FOLFOX 01/21/2014. Oxaliplatin held  secondary to thrombocytopenia.  Cycle 11 FOLFOX 02/04/2014  Cycle 12 FOLFOX 02/18/2014, oxaliplatin dose reduced secondary to thrombocytopenia  CT abdomen/pelvis 01/30/2014 revealed splenomegaly and no evidence of recurrent colon cancer  CT chest 04/07/2014 with a stable right lower lobe nodule and no evidence for metastatic disease 2. Mild elevation of the CEA beginning January 2016 , normal on 05/19/2014  3. History of iron deficiency anemia  4. seizure disorder  5. history of depression  6. 4 mm right lower lobe nodule on a staging chest CT 09/08/2013 , stable on a CT 04/07/2014 7. Hospitalization 09/24/2013 through 09/26/2013 with fever and abdominal pain.  8. 09/24/2013 urine culture positive for coag negative staph.  9. 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 Splenomegaly noted on a CT scan 01/30/2014, the spleen is not palpable today 12. Left arm stiffness and discomfort at the Port-A-Cath site- low clinical suspicion for a Port-A-Cath related DVT or infection 13. Port-A-Cath removed 06/08/2014   Disposition: [name] appears stable. She remains in clinical remission from colon cancer. The CEA was mildly elevated last week. We are obtaining a repeat CEA today. She will return for a follow-up visit and CEA in 3 months. She will contact the office in the interim with any problems.  Plan reviewed with Dr. Benay Spice.    Ned Card ANP/GNP-BC   08/24/2014  2:16 PM

## 2014-08-24 NOTE — Telephone Encounter (Signed)
Pt confirmed labs/ov per 07/19 POF, gave pt AVS and Calendar... KJ

## 2014-08-25 LAB — CEA: CEA: 5.8 ng/mL — AB (ref 0.0–5.0)

## 2014-09-01 ENCOUNTER — Encounter: Payer: Self-pay | Admitting: Internal Medicine

## 2014-10-04 ENCOUNTER — Ambulatory Visit (AMBULATORY_SURGERY_CENTER): Payer: Self-pay

## 2014-10-04 VITALS — Ht 66.0 in | Wt 171.8 lb

## 2014-10-04 DIAGNOSIS — C189 Malignant neoplasm of colon, unspecified: Secondary | ICD-10-CM

## 2014-10-04 MED ORDER — MOVIPREP 100 G PO SOLR: 1.0000 | Freq: Once | ORAL | Status: DC

## 2014-10-04 NOTE — Progress Notes (Signed)
ALLERGIC TO EGGS (VOMITING AND DIARRHEA) AND ADHESIVE TAPE.  NO ALLERGIES TO SOY NO DIET/WEIGHT LOSS MEDS NO HOME OXYGEN NO PAST PROBLEMS WITH ANESTHESIA  REFUSED EMMI

## 2014-10-18 ENCOUNTER — Encounter: Payer: Self-pay | Admitting: Internal Medicine

## 2014-10-18 ENCOUNTER — Ambulatory Visit (AMBULATORY_SURGERY_CENTER): Payer: Medicaid Other | Admitting: Internal Medicine

## 2014-10-18 VITALS — BP 103/61 | HR 59 | Temp 97.6°F | Resp 19 | Ht 66.0 in | Wt 171.0 lb

## 2014-10-18 DIAGNOSIS — Z85038 Personal history of other malignant neoplasm of large intestine: Secondary | ICD-10-CM

## 2014-10-18 MED ORDER — SODIUM CHLORIDE 0.9 % IV SOLN: 500.0000 mL | INTRAVENOUS | Status: DC

## 2014-10-18 NOTE — Progress Notes (Signed)
To recovery, report to [place], RN, VSS 

## 2014-10-18 NOTE — Patient Instructions (Signed)
Impressions/Recommendations;  Repeat colonoscopy in 3 years.  YOU HAD AN ENDOSCOPIC PROCEDURE TODAY AT [facility]:   Refer to the procedure report that was given to you for any specific questions about what was found during the examination.  If the procedure report does not answer your questions, please call your gastroenterologist to clarify.  If you requested that your care partner not be given the details of your procedure findings, then the procedure report has been included in a sealed envelope for you to review at your convenience later.  YOU SHOULD EXPECT: Some feelings of bloating in the abdomen. Passage of more gas than usual.  Walking can help get rid of the air that was put into your GI tract during the procedure and reduce the bloating. If you had a lower endoscopy (such as a colonoscopy or flexible sigmoidoscopy) you may notice spotting of blood in your stool or on the toilet paper. If you underwent a bowel prep for your procedure, you may not have a normal bowel movement for a few days.  Please Note:  You might notice some irritation and congestion in your nose or some drainage.  This is from the oxygen used during your procedure.  There is no need for concern and it should clear up in a day or so.  SYMPTOMS TO REPORT IMMEDIATELY:   Following lower endoscopy (colonoscopy or flexible sigmoidoscopy):  Excessive amounts of blood in the stool  Significant tenderness or worsening of abdominal pains  Swelling of the abdomen that is new, acute  Fever of 100F or higher   For urgent or emergent issues, a gastroenterologist can be reached at any hour by calling [phone].   DIET: Your first meal following the procedure should be a small meal and then it is ok to progress to your normal diet. Heavy or fried foods are harder to digest and may make you feel nauseous or bloated.  Likewise, meals heavy in dairy and vegetables can increase bloating.  Drink plenty of  fluids but you should avoid alcoholic beverages for 24 hours.  ACTIVITY:  You should plan to take it easy for the rest of today and you should NOT DRIVE or use heavy machinery until tomorrow (because of the sedation medicines used during the test).    FOLLOW UP: Our staff will call the number listed on your records the next business day following your procedure to check on you and address any questions or concerns that you may have regarding the information given to you following your procedure. If we do not reach you, we will leave a message.  However, if you are feeling well and you are not experiencing any problems, there is no need to return our call.  We will assume that you have returned to your regular daily activities without incident.  If any biopsies were taken you will be contacted by phone or by letter within the next 1-3 weeks.  Please call us at [phone] if you have not heard about the biopsies in 3 weeks.    SIGNATURES/CONFIDENTIALITY: You and/or your care partner have signed paperwork which will be entered into your electronic medical record.  These signatures attest to the fact that that the information above on your After Visit Summary has been reviewed and is understood.  Full responsibility of the confidentiality of this discharge information lies with you and/or your care-partner.

## 2014-10-18 NOTE — Op Note (Signed)
[facility]  [address] [place], [address]   COLONOSCOPY PROCEDURE REPORT  PATIENT: [name], [name]  MR#: [id number] BIRTHDATE: May 19, 1975 , 38  yrs. old GENDER: female ENDOSCOPIST: Eustace Quail, MD REFERRED DZ:[id number] Program Recall PROCEDURE DATE:  10/18/2014 PROCEDURE:   Colonoscopy, surveillance First Screening Colonoscopy - Avg.  risk and is 50 yrs.  old or older - No.  Prior Negative Screening - Now for repeat screening. N/A  History of Adenoma - Now for follow-up colonoscopy & has been > or = to 3 yrs.  N/A  Polyps removed today? No Recommend repeat exam, <10 yrs? Yes high risk ASA CLASS:   Class II INDICATIONS:Surveillance due to prior colonic neoplasia and PH Colon or Rectal Adenocarcinoma. . Colonoscopy performed June 2015 prior deficiency anemia and abdominal complaints revealed a descending colon cancer. Status post right hemicolectomy July 2015 (T4a N2a). Status post adjuvant chemotherapy MEDICATIONS: Monitored anesthesia care and Propofol 300 mg IV  DESCRIPTION OF PROCEDURE:   After the risks benefits and alternatives of the procedure were thoroughly explained, informed consent was obtained.  The digital rectal exam revealed no abnormalities of the rectum.   The [id number]  endoscope was introduced through the anus and advanced to the surgical anastomosis. No adverse events experienced.   The quality of the prep was excellent.  (Suprep was used)  The instrument was then slowly withdrawn as the colon was fully examined. Estimated blood loss is zero unless otherwise noted in this procedure report.      COLON FINDINGS: There was evidence of a prior ileocolonic surgical anastomosis in the right colon.   The examination was otherwise normal.  Retroflexed views revealed internal hemorrhoids. The time to cecum = 2.1 Withdrawal time = 7.1   The scope was withdrawn and the procedure completed. COMPLICATIONS: There were no  immediate complications.  ENDOSCOPIC IMPRESSION: 1.   There was evidence of a prior ileocolonic surgical anastomosis in the right colon 2.   The examination was otherwise normal  RECOMMENDATIONS: 1. Repeat Colonoscopy in 3 years.  eSigned:  Eustace Quail, MD 10/18/2014 2:19 PM   cc: The Patient, Kavin Leech, MD, and Stark Klein, MD

## 2014-10-19 ENCOUNTER — Telehealth: Payer: Self-pay | Admitting: *Deleted

## 2014-10-19 NOTE — Telephone Encounter (Signed)
  Follow up Call-  Call back number 10/18/2014 07/17/2013  Post procedure Call Back phone  # [phone]  Permission to leave phone message Yes Yes     Patient questions:  Do you have a fever, pain , or abdominal swelling? No. Pain Score  0 *  Have you tolerated food without any problems? Yes.    Have you been able to return to your normal activities? Yes.    Do you have any questions about your discharge instructions: Diet   No. Medications  No. Follow up visit  No.  Do you have questions or concerns about your Care? No.  Actions: * If pain score is 4 or above: No action needed, pain <4.

## 2014-11-04 ENCOUNTER — Other Ambulatory Visit: Payer: Self-pay | Admitting: Neurology

## 2014-11-04 NOTE — Telephone Encounter (Signed)
Rx sent.  No refills given until patient is seen.

## 2014-11-24 ENCOUNTER — Other Ambulatory Visit (HOSPITAL_BASED_OUTPATIENT_CLINIC_OR_DEPARTMENT_OTHER): Payer: Self-pay

## 2014-11-24 DIAGNOSIS — C182 Malignant neoplasm of ascending colon: Secondary | ICD-10-CM

## 2014-11-24 LAB — CEA: CEA: 407.2 ng/mL — AB (ref 0.0–5.0)

## 2014-11-25 ENCOUNTER — Telehealth: Payer: Self-pay | Admitting: *Deleted

## 2014-11-25 ENCOUNTER — Other Ambulatory Visit (HOSPITAL_BASED_OUTPATIENT_CLINIC_OR_DEPARTMENT_OTHER): Payer: Self-pay

## 2014-11-25 ENCOUNTER — Other Ambulatory Visit: Payer: Self-pay | Admitting: Nurse Practitioner

## 2014-11-25 ENCOUNTER — Other Ambulatory Visit: Payer: Self-pay | Admitting: Oncology

## 2014-11-25 DIAGNOSIS — C182 Malignant neoplasm of ascending colon: Secondary | ICD-10-CM

## 2014-11-25 LAB — CBC WITH DIFFERENTIAL/PLATELET
BASO%: 0 % (ref 0.0–2.0)
Basophils Absolute: 0 10*3/uL (ref 0.0–0.1)
EOS%: 0.9 % (ref 0.0–7.0)
Eosinophils Absolute: 0 10*3/uL (ref 0.0–0.5)
HCT: 37.8 % (ref 34.8–46.6)
HGB: 12.8 g/dL (ref 11.6–15.9)
LYMPH%: 21.2 % (ref 14.0–49.7)
MCH: 27.8 pg (ref 25.1–34.0)
MCHC: 33.9 g/dL (ref 31.5–36.0)
MCV: 82 fL (ref 79.5–101.0)
MONO#: 0.3 10*3/uL (ref 0.1–0.9)
MONO%: 6.9 % (ref 0.0–14.0)
NEUT%: 71 % (ref 38.4–76.8)
NEUTROS ABS: 3.2 10*3/uL (ref 1.5–6.5)
PLATELETS: 102 10*3/uL — AB (ref 145–400)
RBC: 4.61 10*6/uL (ref 3.70–5.45)
RDW: 14 % (ref 11.2–14.5)
WBC: 4.5 10*3/uL (ref 3.9–10.3)
lymph#: 1 10*3/uL (ref 0.9–3.3)

## 2014-11-25 LAB — COMPREHENSIVE METABOLIC PANEL (CC13)
ALT: 17 U/L (ref 0–55)
ANION GAP: 6 meq/L (ref 3–11)
AST: 21 U/L (ref 5–34)
Albumin: 4.1 g/dL (ref 3.5–5.0)
Alkaline Phosphatase: 72 U/L (ref 40–150)
BUN: 10.9 mg/dL (ref 7.0–26.0)
CHLORIDE: 109 meq/L (ref 98–109)
CO2: 26 meq/L (ref 22–29)
CREATININE: 0.7 mg/dL (ref 0.6–1.1)
Calcium: 9.6 mg/dL (ref 8.4–10.4)
EGFR: >90 mL/min/{1.73_m2} (ref >90)
GLUCOSE: 93 mg/dL (ref 70–140)
Potassium: 3.7 mEq/L (ref 3.5–5.1)
SODIUM: 141 meq/L (ref 136–145)
TOTAL PROTEIN: 7 g/dL (ref 6.4–8.3)
Total Bilirubin: 1.08 mg/dL (ref 0.20–1.20)

## 2014-11-25 NOTE — Telephone Encounter (Signed)
Dr. Benay Spice called [name] with elevated CEA results today. CEA will be repeated for confirmation. Will check CBC and CMET as well. Vaughan Basta in managed care notified of CT order, no auth required. Spoke with [name] in lobby, appointment/ instructions for CT given. [name] to arrive at [facility] 10/21 @ 1115. NPO 4 hours prior. Provided [name] with oral contrast and instructions to begin drinking at 0830, second bottle at 0930. Teach back method used.

## 2014-11-26 ENCOUNTER — Other Ambulatory Visit: Payer: Self-pay | Admitting: Nurse Practitioner

## 2014-11-26 ENCOUNTER — Encounter (HOSPITAL_COMMUNITY): Payer: Self-pay

## 2014-11-26 ENCOUNTER — Ambulatory Visit (HOSPITAL_COMMUNITY)
Admission: RE | Admit: 2014-11-26 | Discharge: 2014-11-26 | Disposition: A | Discharge status: Home / Self Care | Payer: Medicaid Other | Source: Ambulatory Visit | Attending: Nurse Practitioner | Admitting: Nurse Practitioner

## 2014-11-26 DIAGNOSIS — C182 Malignant neoplasm of ascending colon: Secondary | ICD-10-CM | POA: Insufficient documentation

## 2014-11-26 DIAGNOSIS — R970 Elevated carcinoembryonic antigen [CEA]: Secondary | ICD-10-CM | POA: Insufficient documentation

## 2014-11-26 DIAGNOSIS — Z9049 Acquired absence of other specified parts of digestive tract: Secondary | ICD-10-CM | POA: Insufficient documentation

## 2014-11-26 DIAGNOSIS — R1900 Intra-abdominal and pelvic swelling, mass and lump, unspecified site: Secondary | ICD-10-CM | POA: Insufficient documentation

## 2014-11-26 DIAGNOSIS — R161 Splenomegaly, not elsewhere classified: Secondary | ICD-10-CM | POA: Insufficient documentation

## 2014-11-26 LAB — CEA: CEA: 376.8 ng/mL — ABNORMAL HIGH (ref 0.0–5.0)

## 2014-11-26 MED ORDER — IOHEXOL 300 MG/ML  SOLN
100.0000 mL | Freq: Once | INTRAMUSCULAR | Status: AC | PRN
  Administered 2014-11-26: 100 mL via INTRAVENOUS

## 2014-11-27 LAB — CA 125: CA 125: 14 U/mL (ref <35)

## 2014-11-29 ENCOUNTER — Ambulatory Visit (HOSPITAL_BASED_OUTPATIENT_CLINIC_OR_DEPARTMENT_OTHER): Payer: Self-pay | Admitting: Oncology

## 2014-11-29 ENCOUNTER — Encounter: Payer: Self-pay | Admitting: *Deleted

## 2014-11-29 ENCOUNTER — Telehealth: Payer: Self-pay | Admitting: Oncology

## 2014-11-29 VITALS — BP 132/73 | HR 84 | Temp 98.4°F | Resp 18 | Ht 66.0 in | Wt 175.5 lb

## 2014-11-29 DIAGNOSIS — C182 Malignant neoplasm of ascending colon: Secondary | ICD-10-CM

## 2014-11-29 DIAGNOSIS — N838 Other noninflammatory disorders of ovary, fallopian tube and broad ligament: Secondary | ICD-10-CM

## 2014-11-29 DIAGNOSIS — G620 Drug-induced polyneuropathy: Secondary | ICD-10-CM

## 2014-11-29 DIAGNOSIS — D696 Thrombocytopenia, unspecified: Secondary | ICD-10-CM

## 2014-11-29 DIAGNOSIS — N839 Noninflammatory disorder of ovary, fallopian tube and broad ligament, unspecified: Secondary | ICD-10-CM

## 2014-11-29 NOTE — Progress Notes (Signed)
Oncology Nurse Navigator Documentation  Oncology Nurse Navigator Flowsheets 11/29/2014  Navigator Encounter Type Initial encounter  Patient Visit Type Medonc  Treatment Phase Abnormal Scans--  Barriers/Navigation Needs Education  Education Understanding Cancer/ Treatment Options  Interventions Referrals-expedite referral to GYN Oncology; Provided contact information for questions  Time Spent with Patient [place] aware I will contact her after GI Conference and let her know results of discussion. Spoke with new patient coordinator regarding referral-made for 12/02/14 w/Dr. Denman George. Email request to pathology requesting [facility] on her case from 08/11/13; Case #[id number].

## 2014-11-29 NOTE — Progress Notes (Signed)
[facility] OFFICE PROGRESS NOTE   Diagnosis: Colon cancer  INTERVAL HISTORY:   [name] returns for scheduled visit. She reports feeling well. Good appetite. No pain. A colonoscopy 10/18/2014 was negative. The CEA was repeatedly higher last week.  Objective:  Vital signs in last 24 hours:  Blood pressure 132/73, pulse 84, temperature 98.4 F (36.9 C), temperature source Oral, resp. rate 18, height 5' 6"  (1.676 m), weight 175 lb 8 oz (79.606 kg), SpO2 100 %.    HEENT: Neck without mass Lymphatics: No cervical, supra-clavicular, axillary, or inguinal nodes Resp: Lungs clear bilaterally Cardio: Regular rate and rhythm GI: No hepatosplenomegaly, no apparent ascites, mass in the right lower abdomen, tender in the left lower abdomen Vascular: No leg edema   Lab Results:  Lab Results  Component Value Date   WBC 4.5 11/25/2014   HGB 12.8 11/25/2014   HCT 37.8 11/25/2014   MCV 82.0 11/25/2014   PLT 102* 11/25/2014   NEUTROABS 3.2 11/25/2014     Lab Results  Component Value Date   CEA 376.8* 11/25/2014    Imaging:  Ct Chest W Contrast  11/26/2014  CLINICAL DATA:  Subsequent encounter for colon cancer with rising tumor markers. EXAM: CT CHEST, ABDOMEN, AND PELVIS WITH CONTRAST TECHNIQUE: Multidetector CT imaging of the chest, abdomen and pelvis was performed following the standard protocol during bolus administration of intravenous contrast. CONTRAST:  135m OMNIPAQUE IOHEXOL 300 MG/ML  SOLN COMPARISON:  None. FINDINGS: CT CHEST FINDINGS Mediastinum/Nodes: No focal abnormality within the liver parenchyma. There is no evidence for gallstones, gallbladder wall thickening, or pericholecystic fluid. No intrahepatic or extrahepatic biliary dilation. Lungs/Pleura: No focal airspace consolidation. No pulmonary edema or pleural effusion. No pulmonary parenchymal nodule or mass. Musculoskeletal: Bone windows reveal no worrisome lytic or sclerotic osseous lesions. CT  ABDOMEN PELVIS FINDINGS Hepatobiliary: No focal abnormality within the liver parenchyma. There is no evidence for gallstones, gallbladder wall thickening, or pericholecystic fluid. No intrahepatic or extrahepatic biliary dilation. Pancreas: No focal mass lesion. No dilatation of the main duct. No intraparenchymal cyst. No peripancreatic edema. Spleen: Spleen is 15.1 cm and cranial caudal length, enlarged. No focal abnormality within the splenic parenchyma. Adrenals/Urinary Tract: No adrenal nodule or mass. Kidneys have normal imaging features bilaterally. No evidence for hydroureter. The urinary bladder appears normal for the degree of distention. Stomach/Bowel: Stomach is nondistended. No gastric wall thickening. No evidence of outlet obstruction. Duodenum is normally positioned as is the ligament of Treitz. No small bowel wall thickening. No small bowel dilatation. Patient is status post right hemicolectomy. Remaining portions of the colon have normal CT imaging features. Vascular/Lymphatic: No abdominal aortic aneurysm. Splenic vein and portal vein are prominent. There is no gastrohepatic or hepatoduodenal ligament lymphadenopathy. No intraperitoneal or retroperitoneal lymphadenopy. Reproductive: 12.7 x 13.0 x 12.6 cm mixed cystic and solid masses identified in the right anatomic pelvis. The right ovarian venous anatomy is enlarged and tracks directly into this lesion suggesting that is ovarian in origin. Uterus is surgically absent. No left adnexal mass. Other: Small volume intraperitoneal free fluid seen around the spleen and in the anatomic pelvis. Musculoskeletal: Bone windows reveal no worrisome lytic or sclerotic osseous lesions. IMPRESSION: 1. Interval development of a large 13 cm right pelvic mass arising from the right ovary. Primary ovarian neoplasm and metastatic colon cancer to the right ovary would both be considerations. 2. Splenomegaly with prominence of the portal and splenic veins. Portal venous  hypertension is a consideration. 3. Status post right hemicolectomy.  4. Small volume intraperitoneal free fluid. Electronically Signed   By: Misty Stanley M.D.   On: 11/26/2014 14:22   Ct Abdomen Pelvis W Contrast  11/26/2014  CLINICAL DATA:  Subsequent encounter for colon cancer with rising tumor markers. EXAM: CT CHEST, ABDOMEN, AND PELVIS WITH CONTRAST TECHNIQUE: Multidetector CT imaging of the chest, abdomen and pelvis was performed following the standard protocol during bolus administration of intravenous contrast. CONTRAST:  165m OMNIPAQUE IOHEXOL 300 MG/ML  SOLN COMPARISON:  None. FINDINGS: CT CHEST FINDINGS Mediastinum/Nodes: No focal abnormality within the liver parenchyma. There is no evidence for gallstones, gallbladder wall thickening, or pericholecystic fluid. No intrahepatic or extrahepatic biliary dilation. Lungs/Pleura: No focal airspace consolidation. No pulmonary edema or pleural effusion. No pulmonary parenchymal nodule or mass. Musculoskeletal: Bone windows reveal no worrisome lytic or sclerotic osseous lesions. CT ABDOMEN PELVIS FINDINGS Hepatobiliary: No focal abnormality within the liver parenchyma. There is no evidence for gallstones, gallbladder wall thickening, or pericholecystic fluid. No intrahepatic or extrahepatic biliary dilation. Pancreas: No focal mass lesion. No dilatation of the main duct. No intraparenchymal cyst. No peripancreatic edema. Spleen: Spleen is 15.1 cm and cranial caudal length, enlarged. No focal abnormality within the splenic parenchyma. Adrenals/Urinary Tract: No adrenal nodule or mass. Kidneys have normal imaging features bilaterally. No evidence for hydroureter. The urinary bladder appears normal for the degree of distention. Stomach/Bowel: Stomach is nondistended. No gastric wall thickening. No evidence of outlet obstruction. Duodenum is normally positioned as is the ligament of Treitz. No small bowel wall thickening. No small bowel dilatation. Patient is  status post right hemicolectomy. Remaining portions of the colon have normal CT imaging features. Vascular/Lymphatic: No abdominal aortic aneurysm. Splenic vein and portal vein are prominent. There is no gastrohepatic or hepatoduodenal ligament lymphadenopathy. No intraperitoneal or retroperitoneal lymphadenopy. Reproductive: 12.7 x 13.0 x 12.6 cm mixed cystic and solid masses identified in the right anatomic pelvis. The right ovarian venous anatomy is enlarged and tracks directly into this lesion suggesting that is ovarian in origin. Uterus is surgically absent. No left adnexal mass. Other: Small volume intraperitoneal free fluid seen around the spleen and in the anatomic pelvis. Musculoskeletal: Bone windows reveal no worrisome lytic or sclerotic osseous lesions. IMPRESSION: 1. Interval development of a large 13 cm right pelvic mass arising from the right ovary. Primary ovarian neoplasm and metastatic colon cancer to the right ovary would both be considerations. 2. Splenomegaly with prominence of the portal and splenic veins. Portal venous hypertension is a consideration. 3. Status post right hemicolectomy. 4. Small volume intraperitoneal free fluid. Electronically Signed   By: EMisty StanleyM.D.   On: 11/26/2014 14:22   CT images were reviewed with Ms. Tuttle. Medications: I have reviewed the patient's current medications.  Assessment/Plan: 1. 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  The tumor returned microsatellite stable with no loss of mismatch repair protein expression   Cycle 1 adjuvant FOLFOX 09/08/2013   Cycle 2 adjuvant FOLFOX 09/24/2013   Cycle 3 adjuvant FOLFOX 10/08/2013.   Cycle 4 adjuvant FOLFOX 10/22/2013.   Cycle 5 adjuvant FOLFOX 11/05/2013. Oxaliplatin held due to thrombocytopenia.  Cycle 6 FOLFOX 11/19/2013.  Cycle 7 FOLFOX 12/03/2013. Oxaliplatin held secondary to  thrombocytopenia.  Cycle 8 FOLFOX 12/17/2013.  Cycle 9 FOLFOX 01/04/2014. Oxaliplatin held secondary to neutropenia.   Cycle 10 FOLFOX 01/21/2014. Oxaliplatin held secondary to thrombocytopenia.  Cycle 11 FOLFOX 02/04/2014  Cycle 12 FOLFOX 02/18/2014, oxaliplatin dose reduced secondary to  thrombocytopenia  CT abdomen/pelvis 01/30/2014 revealed splenomegaly and no evidence of recurrent colon cancer  CT chest 04/07/2014 with a stable right lower lobe nodule and no evidence for metastatic disease, no nodules seen on the CT 11/26/2014  Markedly elevated CEA 11/24/2014  CT 11/26/2014 revealed a right pelvic mass, splenomegaly, small volume ascites 2. Mild elevation of the CEA beginning January 2016 , normal on 05/19/2014  3. History of iron deficiency anemia  4. seizure disorder  5. history of depression  6. 4 mm right lower lobe nodule on a staging chest CT 09/08/2013 , stable on a CT 04/07/2014 7. Hospitalization 09/24/2013 through 09/26/2013 with fever and abdominal pain.  8. 09/24/2013 urine culture positive for coag negative staph.  9. 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 Splenomegaly noted on a CT scan 01/30/2014, the spleen is not palpable today 12. Left arm stiffness and discomfort at the Port-A-Cath site- low clinical suspicion for a Port-A-Cath related DVT or infection 13. Port-A-Cath removed 06/08/2014  Disposition:  [name] appears well. The CEA is now markedly elevated and she has developed a right pelvic mass. The mass is most likely related to recurrent colon cancer as opposed to ovarian cancer. I discussed the differential diagnosis and treatment options with Ms. Tuttle. We reviewed the CT images. I will present her case at the GI tumor, 12/01/2014. We Will Make a Referral to GYN Oncology to Consider Resection of the Right Pelvic Mass. She May Also Be a Candidate to See Dr. Clovis Riley  to consider [place].  [name] may have liver disease/portal hypertension secondary to toxicity from the FOLFOX chemotherapy.  She will return for an office visit in approximately 3 weeks. We will see her sooner as needed.  Betsy Coder, MD  11/29/2014  9:49 AM

## 2014-11-29 NOTE — Telephone Encounter (Signed)
GAVE ADN PRINTED APPT SCHED AND AVS FOR PT FOR oct AND nov

## 2014-11-30 ENCOUNTER — Other Ambulatory Visit (HOSPITAL_COMMUNITY)
Admission: RE | Admit: 2014-11-30 | Discharge: 2014-11-30 | Disposition: A | Discharge status: Home / Self Care | Payer: Medicaid Other | Source: Ambulatory Visit | Attending: Oncology | Admitting: Oncology

## 2014-11-30 DIAGNOSIS — C801 Malignant (primary) neoplasm, unspecified: Secondary | ICD-10-CM | POA: Insufficient documentation

## 2014-12-02 ENCOUNTER — Encounter: Payer: Self-pay | Admitting: Gynecologic Oncology

## 2014-12-02 ENCOUNTER — Telehealth: Payer: Self-pay | Admitting: *Deleted

## 2014-12-02 ENCOUNTER — Ambulatory Visit: Payer: Self-pay | Attending: Gynecologic Oncology | Admitting: Gynecologic Oncology

## 2014-12-02 VITALS — BP 113/65 | HR 83 | Temp 98.6°F | Resp 16 | Ht 65.16 in | Wt 174.7 lb

## 2014-12-02 DIAGNOSIS — Z85038 Personal history of other malignant neoplasm of large intestine: Secondary | ICD-10-CM

## 2014-12-02 DIAGNOSIS — R1900 Intra-abdominal and pelvic swelling, mass and lump, unspecified site: Secondary | ICD-10-CM

## 2014-12-02 NOTE — Progress Notes (Signed)
Consult Note: Gyn-Onc  Consult was requested by Dr. Sherrill for the evaluation of [name] 38 y.o. female with a history of colon cancer and a large right ovarian mass.  CC:  Chief Complaint  Patient presents with  . Pelvic mass    New consult    Assessment/Plan:  [name]  is a 38 y.o.  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  I personally reviewed the images from her CT scan from 11/26/14. The mass is smooth and regular externally and is mostly solid. It is not associated with common stigmata of ovarian cancer such as ascites, peritoneal disease or retroperitoneal adenopathy. The CEA is very elevated >300 with a normal CA 125. All of these features are more consistent with a recurrence/metastatic focus rather than a metachronous primary of the ovary.  In order to resect this lesion she will require an exploratory laparotomy given its size and solid nature. She is at risk for surgical complication due to her prior surgery (colectomy and hysterectomy) and because of her very enlarged spleen. I explained that if there is trauma to the spleen intraoperatively there could be severe hemorrhage and splenectomy may be necessary.  I discussed that I will discuss her case with her treating medical and surgical oncologists, Dr Sherrill and Dr Byerly, and if metastatectomy is clinically appropriate and beneficial in the course of her treatment, and outweighs risks associated with surgery, we will proceed with ex lap BSO. I have tentatively scheduled this for mid November.  I further discussed surgical risks with the patient including  bleeding, infection, damage to internal organs (such as bladder,ureters, bowels), blood clot, reoperation and rehospitalization.   HPI: The patient is a 38 year old G2P2 who is seen in consultation at the request of Dr Sherrill for a 13cm solid and cystic right ovarian mass in the setting of a history of stage III colon  adenocarcinoma (s/p resection and FOLFOX).  The patient was being followed as part of surveillance after achieving complete response to therapy, and was noted to have a rising CEA level (375 on 11/25/14). Her CA 125 on 11/26/14 was normal at 15.   A CT of the abdomen and pelvis was obtained on 11/26/14 and demonstrated splenomegaly with a 15cm spleen identified, and prominence of the portal and splenic veins. There was development of a large 13cm right pelvic mass measuring 12.7x13x12.6cm mass with mixed cystic and solid components arising from the right adnexa. The left ovary was not visualized.   She denies symptoms or pain.  Her colon cancer was a right sided lesion, MSI negative, and stage IIIc treated with laparoscopic right colectomy with Dr Byerly on 08/11/13 and followed by FOLFOX chemotherapy completed  With cycle 12 on 1//14/16. Splenomegaly was first observed in December 2015 with no clear etiology.  She remained NED until October, 2016.  The patient reports a family history of a paternal GF with "bone cancer", a maternal uncle with lung cancer (asbestos related), and a maternal aunt with postmenopausal breast cancer, and a paternal uncle with prostate cancer.  She has a remote history of an abdominal hysterectomy for endometriosis.   She has a history of epilepsy. Last seizure was 3 years ago. She has been prescribed lamictal but admits to not taking this since after her last surgery.   Current Meds:  Outpatient Encounter Prescriptions as of 12/02/2014  Medication Sig  . HYDROcodone-acetaminophen (NORCO/VICODIN) 5-325 MG per tablet Take 1-2 tablets   by mouth every 4 (four) hours as needed for moderate pain or severe pain.  Marland Kitchen LORazepam (ATIVAN) 1 MG tablet Take 1 tablet (1 mg total) by mouth 2 (two) times daily as needed for anxiety or sleep.  Marland Kitchen omeprazole (PRILOSEC OTC) 20 MG tablet Take 20 mg by mouth daily.   . ondansetron (ZOFRAN) 8 MG tablet TAKE ONE TABLET BY MOUTH EVERY 8 HOURS  AS NEEDED FOR NAUSEA OR VOMITING.  . rOPINIRole (REQUIP) 4 MG tablet TAKE ONE TABLET BY MOUTH AT BEDTIME.  . [DISCONTINUED] lamoTRIgine (LAMICTAL) 100 MG tablet TAKE ONE TABLET BY MOUTH TWICE DAILY.   No facility-administered encounter medications on file as of 12/02/2014.    Allergy:  Allergies  Allergen Reactions  . Eggs Or Egg-Derived Products Nausea And Vomiting  . Metoclopramide Hcl Other (See Comments)    EXACERBATES RESTLESS LEG SYNDROME  . Oxycodone-Acetaminophen Hives  . Sulfa Antibiotics Nausea And Vomiting  . Adhesive [Tape] Hives and Rash  . Penicillins Itching    Social Hx:   Social History   Social History  . Marital Status: Married    Spouse Name: N/A  . Number of Children: 2  . Years of Education: N/A   Occupational History  . Unemployed- full time student     Completed 12th grade   Social History Main Topics  . Smoking status: Never Smoker   . Smokeless tobacco: Never Used  . Alcohol Use: No  . Drug Use: No  . Sexual Activity: Yes    Birth Control/ Protection: Surgical   Other Topics Concern  . Not on file   Social History Narrative    Past Surgical Hx:  Past Surgical History  Procedure Laterality Date  . Esophagogastroduodenoscopy  07/07/2013  . Laparoscopic partial colectomy Right 08/11/2013    Procedure: LAPAROSCOPIC RIGHT HEMICOLECTOMY;  Surgeon: Stark Klein, MD;  Location: [facility];  Service: General;  Laterality: Right;  . Colonoscopy with propofol  07/07/2013  . Tubal ligation  11/07/2000  . Endometrial fulguration  11/01/2003  . Laparoscopic lysis of adhesions  11/01/2003  . Cystoscopy  11/01/2003  . Appendectomy  08/24/2004  . Abdominal hysterectomy  08/24/2004    partial  . Unilateral salpingectomy Left 08/24/2004  . Left oophorectomy  08/24/2004  . Panniculectomy  08/24/2004  . Portacath placement Left 09/09/2013    Procedure: INSERTION PORT-A-CATH;  Surgeon: Stark Klein, MD;  Location: [facility];  Service: General;   Laterality: Left;  . Colon surgery  08/11/2013    removed a foot of colon  . Port-a-cath removal Left 06/08/2014    Procedure: REMOVAL PORT-A-CATH;  Surgeon: Stark Klein, MD;  Location: [facility];  Service: General;  Laterality: Left;    Past Medical Hx:  Past Medical History  Diagnosis Date  . GERD (gastroesophageal reflux disease)   . Seizures ([place])     last seizure 08/2012  . History of migraine     no problems in "a long time"  . Anemia, iron deficiency   . Restless leg syndrome   . History of blood transfusion 08/16/2013  . Difficulty swallowing pills   . Anxiety   . Depression   . Headache     migraines "long time ago"  . ADD (attention deficit disorder)   . Colon cancer ([place]) 08/2013    Past Gynecological History:  SVD x 2 No LMP recorded. Patient has had a hysterectomy.  Family Hx:  Family History  Problem Relation Age of Onset  . Heart attack Mother   .  Colon polyps Mother   . Colitis Mother   . Diabetes Father   . Heart attack Father   . Stroke Father   . Cirrhosis Maternal Aunt   . Cancer Maternal Uncle     unknown type  . Colon cancer Neg Hx   . Esophageal cancer Neg Hx   . Rectal cancer Neg Hx   . Stomach cancer Neg Hx     Review of Systems:  Constitutional  Feels well,    ENT Normal appearing ears and nares bilaterally Skin/Breast  No rash, sores, jaundice, itching, dryness Cardiovascular  No chest pain, shortness of breath, or edema  Pulmonary  No cough or wheeze.  Gastro Intestinal  No nausea, vomitting, or diarrhoea. No bright red blood per rectum, no abdominal pain, change in bowel movement, or constipation.  Genito Urinary  No frequency, urgency, dysuria,  Musculo Skeletal  No myalgia, arthralgia, joint swelling or pain  Neurologic  No weakness, numbness, change in gait,  Psychology  No depression, anxiety, insomnia.   Vitals:  Blood pressure 113/65, pulse 83, temperature 98.6 F (37 C), temperature source Oral, resp. rate 16, height 5'  5.16" (1.655 m), weight 174 lb 11.2 oz (79.243 kg).  Physical Exam: WD in NAD Neck  Supple NROM, without any enlargements.  Lymph Node Survey No cervical supraclavicular or inguinal adenopathy Cardiovascular  Pulse normal rate, regularity and rhythm. S1 and S2 normal.  Lungs  Clear to auscultation bilateraly, without wheezes/crackles/rhonchi. Good air movement.  Skin  No rash/lesions/breakdown  Psychiatry  Alert and oriented to person, place, and time  Abdomen  Normoactive bowel sounds, abdomen soft, non-tender and obese without evidence of hernia. There is a firm, mobile mass in the central low pelvis that extends to the mid point between umbilicus and pubic symphysis. Back No CVA tenderness Genito Urinary  Vulva/vagina: Normal external female genitalia.  No lesions. No discharge or bleeding.  Bladder/urethra:  No lesions or masses, well supported bladder  Vagina: grossly normal. Mass not appreciated on vaginal exam.  Cervix: surgically absent  Uterus: surgically absent  Adnexa: no discretely palpable masses. Rectal  Good tone, no masses no cul de sac nodularity.  Extremities  No bilateral cyanosis, clubbing or edema.   [name], MD  12/02/2014, 4:07 PM

## 2014-12-02 NOTE — Telephone Encounter (Signed)
  Oncology Nurse Navigator Documentation    Navigator Encounter Type: Telephone (12/02/14 2119): Called [name] to make her aware of GI Conference discussion. Dr. Barry Dienes feels exploratory laparoscopy is appropriate, but agreed it is best to get input from Dr. Denman George. Dr. Barry Dienes plans to speak with Dr. Denman George after she sees her today. Also suggested genetics appointment would be of benefit.

## 2014-12-02 NOTE — Patient Instructions (Signed)
Preparing for your Surgery  Plan for surgery on 12/28/14 with Dr. Everitt Amber. She will performed an exploratory laparotomy with bilateral salpingo oophorectomy (removal of both ovaries and fallopian tubes). We will followup with Dr. Benay Spice and Dr. Barry Dienes regarding this plan and call you with any changes.   Pre-operative Testing -You will receive a phone call from presurgical testing at [facility] to arrange for a pre-operative testing appointment before your surgery.  This appointment normally occurs one to two weeks before your scheduled surgery.   -Bring your insurance card, copy of an advanced directive if applicable, medication list  -At that visit, you will be asked to sign a consent for a possible blood transfusion in case a transfusion becomes necessary during surgery.  The need for a blood transfusion is rare but having consent is a necessary part of your care.     -You should not be taking blood thinners or aspirin at least ten days prior to surgery unless instructed by your surgeon.  Day Before Surgery at [facility] will be asked to take in only clear liquids the day before surgery.  Examples of clear liquids include broths, jello, and clear juices.  Avoid carbonated beverages.  You will be advised to have nothing to eat or drink after midnight the evening before.    Your role in recovery Your role is to become active as soon as directed by your doctor, while still giving yourself time to heal.  Rest when you feel tired. You will be asked to do the following in order to speed your recovery:  - Cough and breathe deeply. This helps toclear and expand your lungs and can prevent pneumonia. You may be given a spirometer to practice deep breathing. A staff member will show you how to use the spirometer. - Do mild physical activity. Walking or moving your legs help your circulation and body functions return to normal. A staff member will help you when you try to walk  and will provide you with simple exercises. Do not try to get up or walk alone the first time. - Actively manage your pain. Managing your pain lets you move in comfort. We will ask you to rate your pain on a scale of zero to 10. It is your responsibility to tell your doctor or nurse where and how much you hurt so your pain can be treated.  Special Considerations -If you are diabetic, you may be placed on insulin after surgery to have closer control over your blood sugars to promote healing and recovery.  This does not mean that you will be discharged on insulin.  If applicable, your oral antidiabetics will be resumed when you are tolerating a solid diet.  -Your final pathology results from surgery should be available by the Friday after surgery and the results will be relayed to you when available.  Blood Transfusion Information WHAT IS A BLOOD TRANSFUSION? A transfusion is the replacement of blood or some of its parts. Blood is made up of multiple cells which provide different functions.  Red blood cells carry oxygen and are used for blood loss replacement.  White blood cells fight against infection.  Platelets control bleeding.  Plasma helps clot blood.  Other blood products are available for specialized needs, such as hemophilia or other clotting disorders. BEFORE THE TRANSFUSION  Who gives blood for transfusions?   You may be able to donate blood to be used at a later date on yourself (autologous donation).  Relatives can  be asked to donate blood. This is generally not any safer than if you have received blood from a stranger. The same precautions are taken to ensure safety when a relative's blood is donated.  Healthy volunteers who are fully evaluated to make sure their blood is safe. This is blood bank blood. Transfusion therapy is the safest it has ever been in the practice of medicine. Before blood is taken from a donor, a complete history is taken to make sure that person has no  history of diseases nor engages in risky social behavior (examples are intravenous drug use or sexual activity with multiple partners). The donor's travel history is screened to minimize risk of transmitting infections, such as malaria. The donated blood is tested for signs of infectious diseases, such as HIV and hepatitis. The blood is then tested to be sure it is compatible with you in order to minimize the chance of a transfusion reaction. If you or a relative donates blood, this is often done in anticipation of surgery and is not appropriate for emergency situations. It takes many days to process the donated blood. RISKS AND COMPLICATIONS Although transfusion therapy is very safe and saves many lives, the main dangers of transfusion include:   Getting an infectious disease.  Developing a transfusion reaction. This is an allergic reaction to something in the blood you were given. Every precaution is taken to prevent this. The decision to have a blood transfusion has been considered carefully by your caregiver before blood is given. Blood is not given unless the benefits outweigh the risks.

## 2014-12-07 ENCOUNTER — Telehealth: Payer: Self-pay | Admitting: *Deleted

## 2014-12-07 DIAGNOSIS — C7961 Secondary malignant neoplasm of right ovary: Secondary | ICD-10-CM

## 2014-12-07 DIAGNOSIS — C182 Malignant neoplasm of ascending colon: Secondary | ICD-10-CM

## 2014-12-07 HISTORY — DX: Secondary malignant neoplasm of right ovary: C79.61

## 2014-12-07 NOTE — Telephone Encounter (Signed)
  Oncology Nurse Navigator Documentation    Navigator Encounter Type: Telephone (12/07/14 1556):Confirmed that Dr. Denman George and Dr. Barry Dienes will be in [place] together on her surgical case on 12/28/14. Their office will do any insurance authorization needed for her.Expresses concern over finances and ability to pay for care-reports she no longer has Medicaid and was told she will not qualify for this. Has some forms to complete to see if she will qualify for [facility] benefit. Going through difficult divorce and custody issue and very anxious and stressed. Would like to speak with CSW if possible and financial advocate to ensure she is doing all she can for herself. Noted she has appointment here week prior to surgery-per Dr. Benay Spice, does not need to see her until week after surgery. Will send POF to scheduler to make change.

## 2014-12-08 ENCOUNTER — Telehealth: Payer: Self-pay | Admitting: Nurse Practitioner

## 2014-12-08 NOTE — Telephone Encounter (Signed)
Called and left a message with a new follow up per pof

## 2014-12-13 ENCOUNTER — Encounter: Payer: Self-pay | Admitting: *Deleted

## 2014-12-13 NOTE — Progress Notes (Signed)
[place] Work  Clinical Social Work was referred by patient navigator for assessment of psychosocial needs.  Clinical Social Worker contacted patient at home to offer support and assess for needs.  Patient stated she was doing "better" since she received the results of her new treatment plan.  Patient also expressed financial concerns, and due to her daughter no longer being in the home she would only receive family planning medicaid.  Patient has completed the financial assistance application with [facility].  CSw also encouraged patient to call Social Services to discuss re-applying based on change in her healthcare status and increased medical bills.  CSW also provided patient support when discussing her divorce and custody battle.  CSW discussed available resources and available emotional support.  Patient was appreciative of support and plans to contact CSW as needed.            Johnnye Lana, MSW, LCSW, OSW-C Clinical Social Worker [facility] [phone]

## 2014-12-14 ENCOUNTER — Encounter (HOSPITAL_COMMUNITY): Payer: Self-pay

## 2014-12-14 ENCOUNTER — Encounter: Payer: Self-pay | Admitting: Oncology

## 2014-12-14 NOTE — Progress Notes (Signed)
Spoke to pt regarding her Medicaid.  Pt informed me that she no longer qualifies for Medicaid because her child no longer lives in her home.  She has applied for a discount thru the hospital.  She turned in that application on 19/1/66.  I gave her my name and contact info for any questions or concerns she may have in the future.

## 2014-12-17 NOTE — Patient Instructions (Signed)
[name]  12/17/2014   Your procedure is scheduled on: December 28, 2014  Report to [facility]  Entrance take [place]  elevators to 3rd floor to  [place] at  11:45 AM.  Call this number if you have problems the morning of surgery [phone]   Remember: ONLY 1 PERSON MAY GO WITH YOU TO SHORT STAY TO GET  READY MORNING OF [place].  Do not eat food after midnight Sunday night.  Follow clear liquid diet Monday AM.  Then nothing by mouth after midnight Monday night.    Take these medicines the morning of surgery with A SIP OF WATER: Prilosec DO NOT TAKE ANY DIABETIC MEDICATIONS DAY OF YOUR SURGERY                               You may not have any metal on your body including hair pins and              piercings  Do not wear jewelry, make-up, lotions, powders or perfumes, deodorant             Do not wear nail polish.  Do not shave  48 hours prior to surgery.                Do not bring valuables to the hospital. [place].  Contacts, dentures or bridgework may not be worn into surgery.  Leave suitcase in the car. After surgery it may be brought to your room.        Special Instructions: coughing and deep breathing exercises, leg exercises.              Please read over the following fact sheets you were given: _____________________________________________________________________             [facility] - Preparing for Surgery Before surgery, you can play an important role.  Because skin is not sterile, your skin needs to be as free of germs as possible.  You can reduce the number of germs on your skin by washing with CHG (chlorahexidine gluconate) soap before surgery.  CHG is an antiseptic cleaner which kills germs and bonds with the skin to continue killing germs even after washing. Please DO NOT use if you have an allergy to CHG or antibacterial soaps.  If your skin becomes  reddened/irritated stop using the CHG and inform your nurse when you arrive at Short Stay. Do not shave (including legs and underarms) for at least 48 hours prior to the first CHG shower.  You may shave your face/neck. Please follow these instructions carefully:  1.  Shower with CHG Soap the night before surgery and the  morning of Surgery.  2.  If you choose to wash your hair, wash your hair first as usual with your  normal  shampoo.  3.  After you shampoo, rinse your hair and body thoroughly to remove the  shampoo.                           4.  Use CHG as you would any other liquid soap.  You can apply chg directly  to the skin and wash  Gently with a scrungie or clean washcloth.  5.  Apply the CHG Soap to your body ONLY FROM THE NECK DOWN.   Do not use on face/ open                           Wound or open sores. Avoid contact with eyes, ears mouth and genitals (private parts).                       Wash face,  Genitals (private parts) with your normal soap.             6.  Wash thoroughly, paying special attention to the area where your surgery  will be performed.  7.  Thoroughly rinse your body with warm water from the neck down.  8.  DO NOT shower/wash with your normal soap after using and rinsing off  the CHG Soap.                9.  Pat yourself dry with a clean towel.            10.  Wear clean pajamas.            11.  Place clean sheets on your bed the night of your first shower and do not  sleep with pets. Day of Surgery : Do not apply any lotions/deodorants the morning of surgery.  Please wear clean clothes to the hospital/surgery center.  FAILURE TO FOLLOW THESE INSTRUCTIONS MAY RESULT IN THE CANCELLATION OF YOUR SURGERY PATIENT SIGNATURE_________________________________  NURSE SIGNATURE__________________________________  ________________________________________________________________________   Adam Phenix  An incentive spirometer is a tool that  can help keep your lungs clear and active. This tool measures how well you are filling your lungs with each breath. Taking long deep breaths may help reverse or decrease the chance of developing breathing (pulmonary) problems (especially infection) following:  A long period of time when you are unable to move or be active. BEFORE THE PROCEDURE   If the spirometer includes an indicator to show your best effort, your nurse or respiratory therapist will set it to a desired goal.  If possible, sit up straight or lean slightly forward. Try not to slouch.  Hold the incentive spirometer in an upright position. INSTRUCTIONS FOR USE   Sit on the edge of your bed if possible, or sit up as far as you can in bed or on a chair.  Hold the incentive spirometer in an upright position.  Breathe out normally.  Place the mouthpiece in your mouth and seal your lips tightly around it.  Breathe in slowly and as deeply as possible, raising the piston or the ball toward the top of the column.  Hold your breath for 3-5 seconds or for as long as possible. Allow the piston or ball to fall to the bottom of the column.  Remove the mouthpiece from your mouth and breathe out normally.  Rest for a few seconds and repeat Steps 1 through 7 at least 10 times every 1-2 hours when you are awake. Take your time and take a few normal breaths between deep breaths.  The spirometer may include an indicator to show your best effort. Use the indicator as a goal to work toward during each repetition.  After each set of 10 deep breaths, practice coughing to be sure your lungs are clear. If you have an incision (the cut made at the time of surgery),  support your incision when coughing by placing a pillow or rolled up towels firmly against it. Once you are able to get out of bed, walk around indoors and cough well. You may stop using the incentive spirometer when instructed by your caregiver.  RISKS AND COMPLICATIONS  Take your  time so you do not get dizzy or light-headed.  If you are in pain, you may need to take or ask for pain medication before doing incentive spirometry. It is harder to take a deep breath if you are having pain. AFTER USE  Rest and breathe slowly and easily.  It can be helpful to keep track of a log of your progress. Your caregiver can provide you with a simple table to help with this. If you are using the spirometer at home, follow these instructions: [place] IF:   You are having difficultly using the spirometer.  You have trouble using the spirometer as often as instructed.  Your pain medication is not giving enough relief while using the spirometer.  You develop fever of 100.5 F (38.1 C) or higher. SEEK IMMEDIATE MEDICAL CARE IF:   You cough up bloody sputum that had not been present before.  You develop fever of 102 F (38.9 C) or greater.  You develop worsening pain at or near the incision site. MAKE SURE YOU:   Understand these instructions.  Will watch your condition.  Will get help right away if you are not doing well or get worse. Document Released: 06/04/2006 Document Revised: 04/16/2011 Document Reviewed: 08/05/2006 ExitCare Patient Information 2014 ExitCare, [place].   ________________________________________________________________________  WHAT IS A BLOOD TRANSFUSION? Blood Transfusion Information  A transfusion is the replacement of blood or some of its parts. Blood is made up of multiple cells which provide different functions.  Red blood cells carry oxygen and are used for blood loss replacement.  White blood cells fight against infection.  Platelets control bleeding.  Plasma helps clot blood.  Other blood products are available for specialized needs, such as hemophilia or other clotting disorders. BEFORE THE TRANSFUSION  Who gives blood for transfusions?   Healthy volunteers who are fully evaluated to make sure their blood is safe. This  is blood bank blood. Transfusion therapy is the safest it has ever been in the practice of medicine. Before blood is taken from a donor, a complete history is taken to make sure that person has no history of diseases nor engages in risky social behavior (examples are intravenous drug use or sexual activity with multiple partners). The donor's travel history is screened to minimize risk of transmitting infections, such as malaria. The donated blood is tested for signs of infectious diseases, such as HIV and hepatitis. The blood is then tested to be sure it is compatible with you in order to minimize the chance of a transfusion reaction. If you or a relative donates blood, this is often done in anticipation of surgery and is not appropriate for emergency situations. It takes many days to process the donated blood. RISKS AND COMPLICATIONS Although transfusion therapy is very safe and saves many lives, the main dangers of transfusion include:   Getting an infectious disease.  Developing a transfusion reaction. This is an allergic reaction to something in the blood you were given. Every precaution is taken to prevent this. The decision to have a blood transfusion has been considered carefully by your caregiver before blood is given. Blood is not given unless the benefits outweigh the risks. AFTER THE TRANSFUSION  Right after receiving a blood transfusion, you will usually feel much better and more energetic. This is especially true if your red blood cells have gotten low (anemic). The transfusion raises the level of the red blood cells which carry oxygen, and this usually causes an energy increase.  The nurse administering the transfusion will monitor you carefully for complications. HOME CARE INSTRUCTIONS  No special instructions are needed after a transfusion. You may find your energy is better. Speak with your caregiver about any limitations on activity for underlying diseases you may have. SEEK  MEDICAL CARE IF:   Your condition is not improving after your transfusion.  You develop redness or irritation at the intravenous (IV) site. SEEK IMMEDIATE MEDICAL CARE IF:  Any of the following symptoms occur over the next 12 hours:  Shaking chills.  You have a temperature by mouth above 102 F (38.9 C), not controlled by medicine.  Chest, back, or muscle pain.  People around you feel you are not acting correctly or are confused.  Shortness of breath or difficulty breathing.  Dizziness and fainting.  You get a rash or develop hives.  You have a decrease in urine output.  Your urine turns a dark color or changes to pink, red, or [place]. Any of the following symptoms occur over the next 10 days:  You have a temperature by mouth above 102 F (38.9 C), not controlled by medicine.  Shortness of breath.  Weakness after normal activity.  The white part of the eye turns yellow (jaundice).  You have a decrease in the amount of urine or are urinating less often.  Your urine turns a dark color or changes to pink, red, or [place]. Document Released: 01/20/2000 Document Revised: 04/16/2011 Document Reviewed: 09/08/2007 ExitCare Patient Information 2014 ExitCare, [place].  _______________________________________________________________________   CLEAR LIQUID DIET   Foods Allowed                                                                     Foods Excluded  Coffee and tea, regular and decaf                             liquids that you cannot  Plain Jell-O in any flavor                                             see through such as: Fruit ices (not with fruit pulp)                                     milk, soups, orange juice  Iced Popsicles                                    All solid food Carbonated beverages, regular and diet  Cranberry, grape and apple juices Sports drinks like Gatorade Lightly seasoned clear broth or consume(fat  free) Sugar, honey syrup  Sample Menu Breakfast                                Lunch                                     Supper Cranberry juice                    Beef broth                            Chicken broth Jell-O                                     Grape juice                           Apple juice Coffee or tea                        Jell-O                                      Popsicle                                                Coffee or tea                        Coffee or tea  _____________________________________________________________________

## 2014-12-20 ENCOUNTER — Ambulatory Visit: Payer: Self-pay | Admitting: Nurse Practitioner

## 2014-12-20 ENCOUNTER — Encounter (HOSPITAL_COMMUNITY): Payer: Self-pay

## 2014-12-20 ENCOUNTER — Encounter (HOSPITAL_COMMUNITY)
Admission: RE | Admit: 2014-12-20 | Discharge: 2014-12-20 | Disposition: A | Discharge status: Home / Self Care | Payer: Medicaid Other | Source: Ambulatory Visit | Attending: Gynecologic Oncology | Admitting: Gynecologic Oncology

## 2014-12-20 DIAGNOSIS — Z01812 Encounter for preprocedural laboratory examination: Secondary | ICD-10-CM | POA: Insufficient documentation

## 2014-12-20 LAB — CBC WITH DIFFERENTIAL/PLATELET
BASOS PCT: 0 %
Basophils Absolute: 0 10*3/uL (ref 0.0–0.1)
EOS ABS: 0 10*3/uL (ref 0.0–0.7)
Eosinophils Relative: 1 %
HEMATOCRIT: 38 % (ref 36.0–46.0)
Hemoglobin: 12.6 g/dL (ref 12.0–15.0)
Lymphocytes Relative: 24 %
Lymphs Abs: 1.1 10*3/uL (ref 0.7–4.0)
MCH: 27.4 pg (ref 26.0–34.0)
MCHC: 33.2 g/dL (ref 30.0–36.0)
MCV: 82.6 fL (ref 78.0–100.0)
MONO ABS: 0.3 10*3/uL (ref 0.1–1.0)
MONOS PCT: 6 %
Neutro Abs: 3.1 10*3/uL (ref 1.7–7.7)
Neutrophils Relative %: 69 %
PLATELETS: 113 10*3/uL — AB (ref 150–400)
RBC: 4.6 MIL/uL (ref 3.87–5.11)
RDW: 13.9 % (ref 11.5–15.5)
WBC: 4.5 10*3/uL (ref 4.0–10.5)

## 2014-12-20 LAB — URINALYSIS, ROUTINE W REFLEX MICROSCOPIC
BILIRUBIN URINE: NEGATIVE
Glucose, UA: NEGATIVE mg/dL
HGB URINE DIPSTICK: NEGATIVE
Ketones, ur: NEGATIVE mg/dL
Nitrite: NEGATIVE
PROTEIN: NEGATIVE mg/dL
Specific Gravity, Urine: 1.026 (ref 1.005–1.030)
UROBILINOGEN UA: 1 mg/dL (ref 0.0–1.0)
pH: 7 (ref 5.0–8.0)

## 2014-12-20 LAB — COMPREHENSIVE METABOLIC PANEL
ALT: 15 U/L (ref 14–54)
ANION GAP: 5 (ref 5–15)
AST: 23 U/L (ref 15–41)
Albumin: 4.3 g/dL (ref 3.5–5.0)
Alkaline Phosphatase: 74 U/L (ref 38–126)
BUN: 14 mg/dL (ref 6–20)
CHLORIDE: 108 mmol/L (ref 101–111)
CO2: 27 mmol/L (ref 22–32)
Calcium: 9 mg/dL (ref 8.9–10.3)
Creatinine, Ser: 0.69 mg/dL (ref 0.44–1.00)
GFR calc non Af Amer: >60 mL/min (ref >60)
Glucose, Bld: 88 mg/dL (ref 65–99)
Potassium: 3.8 mmol/L (ref 3.5–5.1)
SODIUM: 140 mmol/L (ref 135–145)
Total Bilirubin: 1.2 mg/dL (ref 0.3–1.2)
Total Protein: 7.3 g/dL (ref 6.5–8.1)

## 2014-12-20 LAB — URINE MICROSCOPIC-ADD ON

## 2014-12-20 LAB — ABO/RH: ABO/RH(D): A POS

## 2014-12-20 NOTE — Progress Notes (Addendum)
12-20-14 - CBC w/diff lab results from preop appt. On 12-20-14 faxed to Dr. Everitt Amber via EPIC  12-20-14 - UA/ Micro lab results from preop appt. On 12-20-14 faxed to Dr. Everitt Amber via EPIC  12-20-14 - Called Dr. Serita Grit office and talked to [facility].  Wanted to inform Dr. Denman George concerning patients platelet level  (113)  from 12-20-14 labs.

## 2014-12-20 NOTE — Progress Notes (Signed)
12-02-14- LOV-Dr. Denman George (gyn onc) - EPIC 11-29-14 - LOV - Dr. Benay Spice (onc) - EPIC 11-26-14 - CT Chest - EPIC 11-26-14 - CT ABD/Pelvis - EPIC 10-28-14 - Colonoscopy - EPIC  09-27-14 - EKG - EPIC

## 2014-12-28 ENCOUNTER — Ambulatory Visit (HOSPITAL_COMMUNITY): Payer: Medicaid Other | Admitting: Anesthesiology

## 2014-12-28 ENCOUNTER — Encounter (HOSPITAL_COMMUNITY)
Admission: RE | Disposition: A | Discharge status: Home / Self Care | Payer: Self-pay | Source: Ambulatory Visit | Attending: Gynecologic Oncology

## 2014-12-28 ENCOUNTER — Inpatient Hospital Stay (HOSPITAL_COMMUNITY)
Admission: RE | Admit: 2014-12-28 | Discharge: 2014-12-30 | DRG: 737 | Disposition: A | Discharge status: Home / Self Care | Payer: Self-pay | Source: Ambulatory Visit | Attending: Gynecologic Oncology | Admitting: Gynecologic Oncology

## 2014-12-28 ENCOUNTER — Telehealth: Payer: Self-pay | Admitting: Oncology

## 2014-12-28 ENCOUNTER — Encounter (HOSPITAL_COMMUNITY): Payer: Self-pay

## 2014-12-28 ENCOUNTER — Ambulatory Visit (HOSPITAL_COMMUNITY): Payer: Self-pay | Admitting: Anesthesiology

## 2014-12-28 DIAGNOSIS — Z880 Allergy status to penicillin: Secondary | ICD-10-CM

## 2014-12-28 DIAGNOSIS — Z8042 Family history of malignant neoplasm of prostate: Secondary | ICD-10-CM

## 2014-12-28 DIAGNOSIS — C7961 Secondary malignant neoplasm of right ovary: Principal | ICD-10-CM | POA: Diagnosis present

## 2014-12-28 DIAGNOSIS — Z808 Family history of malignant neoplasm of other organs or systems: Secondary | ICD-10-CM

## 2014-12-28 DIAGNOSIS — Z85038 Personal history of other malignant neoplasm of large intestine: Secondary | ICD-10-CM

## 2014-12-28 DIAGNOSIS — Z803 Family history of malignant neoplasm of breast: Secondary | ICD-10-CM

## 2014-12-28 DIAGNOSIS — N838 Other noninflammatory disorders of ovary, fallopian tube and broad ligament: Secondary | ICD-10-CM | POA: Insufficient documentation

## 2014-12-28 DIAGNOSIS — F329 Major depressive disorder, single episode, unspecified: Secondary | ICD-10-CM | POA: Diagnosis present

## 2014-12-28 DIAGNOSIS — C189 Malignant neoplasm of colon, unspecified: Secondary | ICD-10-CM

## 2014-12-28 DIAGNOSIS — F419 Anxiety disorder, unspecified: Secondary | ICD-10-CM | POA: Diagnosis present

## 2014-12-28 DIAGNOSIS — R1900 Intra-abdominal and pelvic swelling, mass and lump, unspecified site: Secondary | ICD-10-CM | POA: Diagnosis present

## 2014-12-28 DIAGNOSIS — Z91012 Allergy to eggs: Secondary | ICD-10-CM

## 2014-12-28 DIAGNOSIS — Z801 Family history of malignant neoplasm of trachea, bronchus and lung: Secondary | ICD-10-CM

## 2014-12-28 DIAGNOSIS — K219 Gastro-esophageal reflux disease without esophagitis: Secondary | ICD-10-CM | POA: Diagnosis present

## 2014-12-28 DIAGNOSIS — Z888 Allergy status to other drugs, medicaments and biological substances status: Secondary | ICD-10-CM

## 2014-12-28 DIAGNOSIS — D62 Acute posthemorrhagic anemia: Secondary | ICD-10-CM | POA: Diagnosis not present

## 2014-12-28 DIAGNOSIS — Z79899 Other long term (current) drug therapy: Secondary | ICD-10-CM

## 2014-12-28 DIAGNOSIS — Z882 Allergy status to sulfonamides status: Secondary | ICD-10-CM

## 2014-12-28 DIAGNOSIS — G40909 Epilepsy, unspecified, not intractable, without status epilepticus: Secondary | ICD-10-CM | POA: Diagnosis present

## 2014-12-28 DIAGNOSIS — Z79891 Long term (current) use of opiate analgesic: Secondary | ICD-10-CM

## 2014-12-28 DIAGNOSIS — G2581 Restless legs syndrome: Secondary | ICD-10-CM | POA: Diagnosis present

## 2014-12-28 DIAGNOSIS — R970 Elevated carcinoembryonic antigen [CEA]: Secondary | ICD-10-CM | POA: Diagnosis present

## 2014-12-28 HISTORY — PX: SALPINGOOPHORECTOMY: SHX82

## 2014-12-28 HISTORY — PX: LAPAROTOMY: SHX154

## 2014-12-28 LAB — TYPE AND SCREEN
ABO/RH(D): A POS
ANTIBODY SCREEN: NEGATIVE

## 2014-12-28 LAB — CBC
HEMATOCRIT: 35.8 % — AB (ref 36.0–46.0)
Hemoglobin: 11.8 g/dL — ABNORMAL LOW (ref 12.0–15.0)
MCH: 27.4 pg (ref 26.0–34.0)
MCHC: 33 g/dL (ref 30.0–36.0)
MCV: 83.3 fL (ref 78.0–100.0)
PLATELETS: 130 10*3/uL — AB (ref 150–400)
RBC: 4.3 MIL/uL (ref 3.87–5.11)
RDW: 14.5 % (ref 11.5–15.5)
WBC: 7.5 10*3/uL (ref 4.0–10.5)

## 2014-12-28 SURGERY — LAPAROTOMY, EXPLORATORY
Anesthesia: General | Laterality: Right

## 2014-12-28 MED ORDER — FENTANYL CITRATE (PF) 100 MCG/2ML IJ SOLN
25.0000 ug | INTRAMUSCULAR | Status: DC | PRN
Start: 2014-12-28 — End: 2014-12-28

## 2014-12-28 MED ORDER — HYDROMORPHONE HCL 1 MG/ML IJ SOLN
INTRAMUSCULAR | Status: DC | PRN
  Administered 2014-12-28 (×2): .5 mg via INTRAVENOUS

## 2014-12-28 MED ORDER — HYDROMORPHONE HCL 1 MG/ML IJ SOLN
0.5000 mg | INTRAMUSCULAR | Status: DC | PRN
Start: 2014-12-28 — End: 2014-12-30
  Administered 2014-12-28: 0.5 mg via INTRAVENOUS
  Filled 2014-12-28: qty 1

## 2014-12-28 MED ORDER — ONDANSETRON HCL 4 MG/2ML IJ SOLN
4.0000 mg | Freq: Four times a day (QID) | INTRAMUSCULAR | Status: DC | PRN
  Administered 2014-12-28: 4 mg via INTRAVENOUS
  Filled 2014-12-28: qty 2

## 2014-12-28 MED ORDER — ENOXAPARIN SODIUM 40 MG/0.4ML ~~LOC~~ SOLN
40.0000 mg | SUBCUTANEOUS | Status: AC
  Administered 2014-12-28: 40 mg via SUBCUTANEOUS
  Filled 2014-12-28: qty 0.4

## 2014-12-28 MED ORDER — FENTANYL CITRATE (PF) 100 MCG/2ML IJ SOLN
INTRAMUSCULAR | Status: DC | PRN
  Administered 2014-12-28 (×2): 50 ug via INTRAVENOUS
  Administered 2014-12-28: 100 ug via INTRAVENOUS
  Administered 2014-12-28: 50 ug via INTRAVENOUS

## 2014-12-28 MED ORDER — KETOROLAC TROMETHAMINE 30 MG/ML IJ SOLN
15.0000 mg | Freq: Four times a day (QID) | INTRAMUSCULAR | Status: AC
  Filled 2014-12-28: qty 1

## 2014-12-28 MED ORDER — CIPROFLOXACIN IN D5W 400 MG/200ML IV SOLN
INTRAVENOUS | Status: AC
  Filled 2014-12-28: qty 200

## 2014-12-28 MED ORDER — MIDAZOLAM HCL 2 MG/2ML IJ SOLN
INTRAMUSCULAR | Status: AC
  Filled 2014-12-28: qty 2

## 2014-12-28 MED ORDER — GLYCOPYRROLATE 0.2 MG/ML IJ SOLN
INTRAMUSCULAR | Status: AC
  Filled 2014-12-28: qty 3

## 2014-12-28 MED ORDER — HYDROMORPHONE HCL 2 MG PO TABS
2.0000 mg | ORAL_TABLET | ORAL | Status: DC | PRN
  Administered 2014-12-29 (×2): 2 mg via ORAL
  Filled 2014-12-28 (×3): qty 1

## 2014-12-28 MED ORDER — HYDROMORPHONE HCL 1 MG/ML IJ SOLN
0.2500 mg | INTRAMUSCULAR | Status: DC | PRN
  Administered 2014-12-28: 0.5 mg via INTRAVENOUS
  Administered 2014-12-28 (×2): 0.25 mg via INTRAVENOUS
  Administered 2014-12-28 (×2): 0.5 mg via INTRAVENOUS

## 2014-12-28 MED ORDER — LIDOCAINE HCL (CARDIAC) 20 MG/ML IV SOLN
INTRAVENOUS | Status: AC
  Filled 2014-12-28: qty 5

## 2014-12-28 MED ORDER — ONDANSETRON HCL 4 MG PO TABS: 4.0000 mg | ORAL_TABLET | Freq: Four times a day (QID) | ORAL | Status: DC | PRN

## 2014-12-28 MED ORDER — PROPOFOL 10 MG/ML IV BOLUS
INTRAVENOUS | Status: DC | PRN
  Administered 2014-12-28: 150 mg via INTRAVENOUS

## 2014-12-28 MED ORDER — HYDROMORPHONE HCL 1 MG/ML IJ SOLN
INTRAMUSCULAR | Status: AC
  Filled 2014-12-28: qty 1

## 2014-12-28 MED ORDER — DEXAMETHASONE SODIUM PHOSPHATE 10 MG/ML IJ SOLN
INTRAMUSCULAR | Status: AC
  Filled 2014-12-28: qty 1

## 2014-12-28 MED ORDER — ONDANSETRON HCL 4 MG/2ML IJ SOLN
INTRAMUSCULAR | Status: DC | PRN
  Administered 2014-12-28: 4 mg via INTRAVENOUS

## 2014-12-28 MED ORDER — ENSURE ENLIVE PO LIQD
237.0000 mL | Freq: Two times a day (BID) | ORAL | Status: DC
  Administered 2014-12-29: 237 mL via ORAL

## 2014-12-28 MED ORDER — FENTANYL CITRATE (PF) 250 MCG/5ML IJ SOLN
INTRAMUSCULAR | Status: AC
  Filled 2014-12-28: qty 5

## 2014-12-28 MED ORDER — BUPIVACAINE LIPOSOME 1.3 % IJ SUSP
20.0000 mL | Freq: Once | INTRAMUSCULAR | Status: AC
  Administered 2014-12-28: 20 mL
  Filled 2014-12-28: qty 20

## 2014-12-28 MED ORDER — NEOSTIGMINE METHYLSULFATE 10 MG/10ML IV SOLN
INTRAVENOUS | Status: DC | PRN
  Administered 2014-12-28: 4 mg via INTRAVENOUS

## 2014-12-28 MED ORDER — HYDROMORPHONE HCL 2 MG/ML IJ SOLN
INTRAMUSCULAR | Status: AC
  Filled 2014-12-28: qty 1

## 2014-12-28 MED ORDER — PHENYLEPHRINE 40 MCG/ML (10ML) SYRINGE FOR IV PUSH (FOR BLOOD PRESSURE SUPPORT)
PREFILLED_SYRINGE | INTRAVENOUS | Status: AC
  Filled 2014-12-28: qty 10

## 2014-12-28 MED ORDER — LIDOCAINE HCL (CARDIAC) 20 MG/ML IV SOLN
INTRAVENOUS | Status: DC | PRN
  Administered 2014-12-28: 100 mg via INTRAVENOUS

## 2014-12-28 MED ORDER — PHENYLEPHRINE HCL 10 MG/ML IJ SOLN
INTRAMUSCULAR | Status: DC | PRN
  Administered 2014-12-28 (×2): 120 ug via INTRAVENOUS

## 2014-12-28 MED ORDER — LORAZEPAM 1 MG PO TABS
1.0000 mg | ORAL_TABLET | Freq: Two times a day (BID) | ORAL | Status: DC | PRN
  Administered 2014-12-29 – 2014-12-30 (×2): 1 mg via ORAL
  Filled 2014-12-28 (×2): qty 1

## 2014-12-28 MED ORDER — CLINDAMYCIN PHOSPHATE 900 MG/50ML IV SOLN
INTRAVENOUS | Status: AC
  Filled 2014-12-28: qty 50

## 2014-12-28 MED ORDER — KETOROLAC TROMETHAMINE 30 MG/ML IJ SOLN
15.0000 mg | Freq: Four times a day (QID) | INTRAMUSCULAR | Status: AC
  Administered 2014-12-28 – 2014-12-29 (×4): 15 mg via INTRAVENOUS
  Filled 2014-12-28 (×3): qty 1

## 2014-12-28 MED ORDER — KCL IN DEXTROSE-NACL 20-5-0.45 MEQ/L-%-% IV SOLN
INTRAVENOUS | Status: DC
  Administered 2014-12-28: 19:00:00 via INTRAVENOUS
  Filled 2014-12-28 (×2): qty 1000

## 2014-12-28 MED ORDER — PROPOFOL 10 MG/ML IV BOLUS
INTRAVENOUS | Status: AC
  Filled 2014-12-28: qty 20

## 2014-12-28 MED ORDER — NEOSTIGMINE METHYLSULFATE 10 MG/10ML IV SOLN
INTRAVENOUS | Status: AC
  Filled 2014-12-28: qty 1

## 2014-12-28 MED ORDER — ACETAMINOPHEN 500 MG PO TABS
1000.0000 mg | ORAL_TABLET | Freq: Four times a day (QID) | ORAL | Status: DC
  Administered 2014-12-28 – 2014-12-30 (×6): 1000 mg via ORAL
  Filled 2014-12-28 (×13): qty 2

## 2014-12-28 MED ORDER — PANTOPRAZOLE SODIUM 40 MG PO TBEC
40.0000 mg | DELAYED_RELEASE_TABLET | Freq: Every day | ORAL | Status: DC
  Administered 2014-12-28 – 2014-12-29 (×2): 40 mg via ORAL
  Filled 2014-12-28 (×4): qty 1

## 2014-12-28 MED ORDER — DEXAMETHASONE SODIUM PHOSPHATE 10 MG/ML IJ SOLN
INTRAMUSCULAR | Status: DC | PRN
  Administered 2014-12-28: 10 mg via INTRAVENOUS

## 2014-12-28 MED ORDER — PROMETHAZINE HCL 25 MG/ML IJ SOLN: 6.2500 mg | INTRAMUSCULAR | Status: DC | PRN

## 2014-12-28 MED ORDER — CLINDAMYCIN PHOSPHATE 900 MG/50ML IV SOLN
900.0000 mg | INTRAVENOUS | Status: AC
  Administered 2014-12-28: 900 mg via INTRAVENOUS

## 2014-12-28 MED ORDER — MIDAZOLAM HCL 5 MG/5ML IJ SOLN
INTRAMUSCULAR | Status: DC | PRN
  Administered 2014-12-28: 2 mg via INTRAVENOUS

## 2014-12-28 MED ORDER — GLYCOPYRROLATE 0.2 MG/ML IJ SOLN
INTRAMUSCULAR | Status: DC | PRN
  Administered 2014-12-28: 0.6 mg via INTRAVENOUS

## 2014-12-28 MED ORDER — CIPROFLOXACIN IN D5W 400 MG/200ML IV SOLN
400.0000 mg | INTRAVENOUS | Status: AC
  Administered 2014-12-28: 400 mg via INTRAVENOUS

## 2014-12-28 MED ORDER — OMEPRAZOLE MAGNESIUM 20 MG PO TBEC: 20.0000 mg | DELAYED_RELEASE_TABLET | Freq: Every day | ORAL | Status: DC

## 2014-12-28 MED ORDER — HEMOSTATIC AGENTS (NO CHARGE) OPTIME
TOPICAL | Status: DC | PRN
  Administered 2014-12-28: 1 via TOPICAL

## 2014-12-28 MED ORDER — SODIUM CHLORIDE 0.9 % IJ SOLN
INTRAMUSCULAR | Status: AC
Start: 2014-12-28 — End: 2014-12-28
  Filled 2014-12-28: qty 20

## 2014-12-28 MED ORDER — ONDANSETRON HCL 4 MG/2ML IJ SOLN
INTRAMUSCULAR | Status: AC
  Filled 2014-12-28: qty 2

## 2014-12-28 MED ORDER — KETOROLAC TROMETHAMINE 30 MG/ML IJ SOLN
INTRAMUSCULAR | Status: AC
  Filled 2014-12-28: qty 1

## 2014-12-28 MED ORDER — TRAMADOL HCL 50 MG PO TABS
100.0000 mg | ORAL_TABLET | Freq: Four times a day (QID) | ORAL | Status: DC
  Administered 2014-12-28 – 2014-12-30 (×7): 100 mg via ORAL
  Filled 2014-12-28 (×7): qty 2

## 2014-12-28 MED ORDER — ROCURONIUM BROMIDE 100 MG/10ML IV SOLN
INTRAVENOUS | Status: DC | PRN
  Administered 2014-12-28: 40 mg via INTRAVENOUS
  Administered 2014-12-28: 10 mg via INTRAVENOUS

## 2014-12-28 MED ORDER — ROCURONIUM BROMIDE 100 MG/10ML IV SOLN
INTRAVENOUS | Status: AC
  Filled 2014-12-28: qty 1

## 2014-12-28 MED ORDER — EPHEDRINE SULFATE 50 MG/ML IJ SOLN
INTRAMUSCULAR | Status: DC | PRN
  Administered 2014-12-28: 15 mg via INTRAVENOUS
  Administered 2014-12-28: 10 mg via INTRAVENOUS

## 2014-12-28 MED ORDER — MAGNESIUM HYDROXIDE 400 MG/5ML PO SUSP
30.0000 mL | Freq: Three times a day (TID) | ORAL | Status: AC
  Administered 2014-12-29: 30 mL via ORAL
  Filled 2014-12-28 (×2): qty 30

## 2014-12-28 MED ORDER — LACTATED RINGERS IV SOLN
INTRAVENOUS | Status: DC
  Administered 2014-12-28: 15:00:00 via INTRAVENOUS
  Administered 2014-12-28: 1000 mL via INTRAVENOUS

## 2014-12-28 MED ORDER — IBUPROFEN 800 MG PO TABS
800.0000 mg | ORAL_TABLET | Freq: Four times a day (QID) | ORAL | Status: DC
  Administered 2014-12-29 – 2014-12-30 (×3): 800 mg via ORAL
  Filled 2014-12-28: qty 4
  Filled 2014-12-28: qty 1
  Filled 2014-12-28: qty 4
  Filled 2014-12-28 (×2): qty 1
  Filled 2014-12-28: qty 4
  Filled 2014-12-28 (×4): qty 1

## 2014-12-28 MED ORDER — ENOXAPARIN SODIUM 40 MG/0.4ML ~~LOC~~ SOLN
40.0000 mg | SUBCUTANEOUS | Status: DC
  Administered 2014-12-29 – 2014-12-30 (×2): 40 mg via SUBCUTANEOUS
  Filled 2014-12-28 (×3): qty 0.4

## 2014-12-28 SURGICAL SUPPLY — 43 items
ATTRACTOMAT 16X20 MAGNETIC DRP (DRAPES) ×2 IMPLANT
CHLORAPREP W/TINT 26ML (MISCELLANEOUS) ×4 IMPLANT
CLIP TI MEDIUM LARGE 6 (CLIP) IMPLANT
CONT SPEC 4OZ CLIKSEAL STRL BL (MISCELLANEOUS) IMPLANT
COVER SURGICAL LIGHT HANDLE (MISCELLANEOUS) ×4 IMPLANT
DRAPE UTILITY 15X26 (DRAPE) ×2 IMPLANT
DRAPE WARM FLUID 44X44 (DRAPE) ×4 IMPLANT
DRESSING TELFA ISLAND 4X8 (GAUZE/BANDAGES/DRESSINGS) ×2 IMPLANT
DRSG OPSITE POSTOP 4X10 (GAUZE/BANDAGES/DRESSINGS) ×2 IMPLANT
ELECT LIGASURE SHORT 9 REUSE (ELECTRODE) IMPLANT
ELECT REM PT RETURN 9FT ADLT (ELECTROSURGICAL) ×4
ELECTRODE REM PT RTRN 9FT ADLT (ELECTROSURGICAL) ×2 IMPLANT
GAUZE SPONGE 4X4 12PLY STRL (GAUZE/BANDAGES/DRESSINGS) IMPLANT
GAUZE SPONGE 4X4 16PLY XRAY LF (GAUZE/BANDAGES/DRESSINGS) ×2 IMPLANT
GLOVE BIO SURGEON STRL SZ 6 (GLOVE) ×10 IMPLANT
GLOVE BIO SURGEON STRL SZ 6.5 (GLOVE) ×6 IMPLANT
GLOVE BIO SURGEONS STRL SZ 6.5 (GLOVE) ×2
GOWN STRL REUS W/ TWL LRG LVL3 (GOWN DISPOSABLE) ×4 IMPLANT
GOWN STRL REUS W/TWL LRG LVL3 (GOWN DISPOSABLE) ×8
KIT BASIN OR (CUSTOM PROCEDURE TRAY) ×4 IMPLANT
LIQUID BAND (GAUZE/BANDAGES/DRESSINGS) ×2 IMPLANT
LOOP VESSEL MAXI BLUE (MISCELLANEOUS) IMPLANT
NEEDLE HYPO 22GX1.5 SAFETY (NEEDLE) ×8 IMPLANT
NS IRRIG 1000ML POUR BTL (IV SOLUTION) ×8 IMPLANT
PACK GENERAL/GYN (CUSTOM PROCEDURE TRAY) ×4 IMPLANT
SHEET LAVH (DRAPES) ×4 IMPLANT
SPONGE LAP 18X18 X RAY DECT (DISPOSABLE) ×6 IMPLANT
STAPLER VISISTAT 35W (STAPLE) IMPLANT
SURGIFLO W/THROMBIN 8M KIT (HEMOSTASIS) ×2 IMPLANT
SUT MNCRL AB 4-0 PS2 18 (SUTURE) ×4 IMPLANT
SUT PDS AB 1 TP1 96 (SUTURE) ×10 IMPLANT
SUT VIC AB 0 CT1 36 (SUTURE) ×6 IMPLANT
SUT VIC AB 2-0 CT1 36 (SUTURE) ×18 IMPLANT
SUT VIC AB 2-0 CT2 27 (SUTURE) ×24 IMPLANT
SUT VIC AB 3-0 CTX 36 (SUTURE) IMPLANT
SUT VIC AB 3-0 SH 27 (SUTURE) ×12
SUT VIC AB 3-0 SH 27XBRD (SUTURE) IMPLANT
SYR 20CC LL (SYRINGE) ×8 IMPLANT
TOWEL OR 17X26 10 PK STRL BLUE (TOWEL DISPOSABLE) ×4 IMPLANT
TOWEL OR NON WOVEN STRL DISP B (DISPOSABLE) ×4 IMPLANT
TRAY FOLEY W/METER SILVER 14FR (SET/KITS/TRAYS/PACK) ×4 IMPLANT
TRAY FOLEY W/METER SILVER 16FR (SET/KITS/TRAYS/PACK) ×2 IMPLANT
WATER STERILE IRR 1500ML POUR (IV SOLUTION) ×2 IMPLANT

## 2014-12-28 NOTE — Anesthesia Postprocedure Evaluation (Signed)
Anesthesia Post Note  Patient: [name]  Procedure(s) Performed: Procedure(s) (LRB): EXPLORATORY LAPAROTOMY (N/A) RIGHT SALPINGO OOPHORECTOMY (Right)  Patient location during evaluation: PACU Anesthesia Type: General Level of consciousness: awake and alert Pain management: pain level controlled Vital Signs Assessment: post-procedure vital signs reviewed and stable Respiratory status: spontaneous breathing, nonlabored ventilation, respiratory function stable and patient connected to nasal cannula oxygen Cardiovascular status: blood pressure returned to baseline and stable Postop Assessment: No signs of nausea or vomiting Anesthetic complications: no    Last Vitals:  Filed Vitals:   12/28/14 1738 12/28/14 1750  BP:  116/71  Pulse:  72  Temp: 36.4 C 36.7 C  Resp:  14    Last Pain:  Filed Vitals:   12/28/14 1751  PainSc: 4                  Kritika Stukes DAVID

## 2014-12-28 NOTE — Telephone Encounter (Signed)
Due to LT out per BS movd 11/28 f/u to 12/6 with him. Left message for patient and mailed schedule.

## 2014-12-28 NOTE — Anesthesia Preprocedure Evaluation (Addendum)
Anesthesia Evaluation  Patient identified by MRN, date of birth, ID band Patient awake    Reviewed: Allergy & Precautions, NPO status , Patient's Chart, lab work & pertinent test results  Airway Mallampati: II  TM Distance: >3 FB Neck ROM: Full    Dental  (+) Teeth Intact   Pulmonary neg pulmonary ROS,    breath sounds clear to auscultation       Cardiovascular negative cardio ROS   Rhythm:Regular Rate:Normal     Neuro/Psych  Headaches, Seizures -,  PSYCHIATRIC DISORDERS Anxiety Depression    GI/Hepatic Neg liver ROS, GERD  Medicated,  Endo/Other  negative endocrine ROS  Renal/GU negative Renal ROS  negative genitourinary   Musculoskeletal negative musculoskeletal ROS (+)   Abdominal   Peds negative pediatric ROS (+)  Hematology negative hematology ROS (+)   Anesthesia Other Findings   Reproductive/Obstetrics negative OB ROS                            Lab Results  Component Value Date   WBC 4.5 12/20/2014   HGB 12.6 12/20/2014   HCT 38.0 12/20/2014   MCV 82.6 12/20/2014   PLT 113* 12/20/2014   Lab Results  Component Value Date   CREATININE 0.69 12/20/2014   BUN 14 12/20/2014   NA 140 12/20/2014   K 3.8 12/20/2014   CL 108 12/20/2014   CO2 27 12/20/2014   Lab Results  Component Value Date   INR 1.19 09/25/2013   EKG: sinus tachycardia.   Anesthesia Physical Anesthesia Plan  ASA: II  Anesthesia Plan: General   Post-op Pain Management:    Induction: Intravenous  Airway Management Planned: Oral ETT  Additional Equipment:   Intra-op Plan:   Post-operative Plan: Extubation in OR  Informed Consent: I have reviewed the patients History and Physical, chart, labs and discussed the procedure including the risks, benefits and alternatives for the proposed anesthesia with the patient or authorized representative who has indicated his/her understanding and acceptance.    Dental advisory given  Plan Discussed with: CRNA  Anesthesia Plan Comments:         Anesthesia Quick Evaluation

## 2014-12-28 NOTE — Op Note (Signed)
OPERATIVE NOTE 12/28/14  Preoperative Diagnosis: 1. Right ovarian mass, elevated CEA, hx of colon cancer  Postoperative Diagnosis:   Recurrent colon cancer  Procedure(s) Performed: 1. Exploratory laparotomy with right salpingo-oophorectomy, radical tumor debulking for ovarian cancer .  Surgeon: Thereasa Solo, MD.  Assistant Surgeon: Dr Stark Klein, Lahoma Crocker, M.D. Assistant: (an MD assistant was necessary for tissue manipulation, retraction and positioning due to the complexity of the case and hospital policies).   Specimens: right tube and ovary   Estimated Blood Loss: 250 mL.    Urine Output: [id number] cc  Complications: None.   Operative Findings: 200cc of ascites. 15cm solid right ovarian mass, densely adherent to the right sidewall.  No gross residual disease at the completion of the case. Normal liver to palpate. Surgically absent uterus and left tube and ovary.  Procedure:   The patient was seen in the Holding Room. The risks, benefits, complications, treatment options, and expected outcomes were discussed with the patient.  The patient concurred with the proposed plan, giving informed consent.   The patient was  identified as [name]  and the procedure verified as RSO, tumor debulking. A Time Out was held and the above information confirmed upon entry to the operating room..  After induction of anesthesia, the patient was draped and prepped in the usual sterile manner.  She was prepped and draped in the normal sterile fashion in the dorsal lithotomy position in padded Allen stirrups with good attention paid to support of the lower back and lower extremities. Position was adjusted for appropriate support. A Foley catheter was placed to gravity.   A midline vertical incision was made and carried through the subcutaneous tissue to the fascia. The fascial incision was made and extended superiorally. The rectus muscles were separated. The peritoneum was identified and entered.  Peritoneal incision was extended longitudinally.  The abdominal cavity was entered sharply and without incident. A Bookwalter retractor was then placed. A survey of the abdomen and pelvis revealed the above findings, which were significant for ascites, a 15cm right ovarian mass. The omentum was densely adherent to the anterior abdominal wall. It was taken down with bovie dissection.  After packing the small bowel into the upper abdomen, we performed the right salpingo-oophorectomy by entering the  pelvic sidewall just posterior to the right round ligament. The pararectal space was developed and the retroperitoneum developed up to the level of the common iliac artery.  The course of the ureter was identified with ease. The right IP was then skeletonized, and clamped and double suture ligated. The ovary was separated from its peritoneal attachments with the bovie with visualization of the ureter at all times. The ovary was separated from the vaginal cuff/cardinal ligament attatchments using the careful bovie and sharp dissection.  The separated ovarian tumor was sent for frozen section which confirmed adenocarcinoma.   Bleeding was observed from the pedicles that had been adherent to the ovary deep in the right pelvis. These were oversewn with 3-0 vicryl.  The peritoneal cavity was irrigated and hemostasis was confirmed at all surgical sites.  No visible or palpable residual tumor was present.   The fascia was reapproximated with 0 looped PDS using a total of two sutures. The subcutaneous layer was then irrigated copiously.  Exparel long acting local anesthetic was infiltrated into the subcutaneous tissues. The skin was closed with monocryl subcuticular suture. The patient tolerated the procedure well.   Sponge, lap and needle counts were correct x 2.  [name], MD

## 2014-12-28 NOTE — Transfer of Care (Signed)
Immediate Anesthesia Transfer of Care Note  Patient: [name]  Procedure(s) Performed: Procedure(s): EXPLORATORY LAPAROTOMY (N/A) RIGHT SALPINGO OOPHORECTOMY (Right)  Patient Location: PACU  Anesthesia Type:General  Level of Consciousness: sedated, patient cooperative and responds to stimulation  Airway & Oxygen Therapy: Patient Spontanous Breathing and Patient connected to face mask oxygen  Post-op Assessment: Report given to RN and Post -op Vital signs reviewed and stable  Post vital signs: Reviewed and stable  Last Vitals:  Filed Vitals:   12/28/14 1110  BP: 96/77  Pulse: 78  Temp: 37.1 C  Resp: 16    Complications: No apparent anesthesia complications

## 2014-12-28 NOTE — Anesthesia Procedure Notes (Signed)
Procedure Name: Intubation Date/Time: 12/28/2014 2:32 PM Performed by: Anne Fu Pre-anesthesia Checklist: Patient identified, Emergency Drugs available, Suction available and Patient being monitored Patient Re-evaluated:Patient Re-evaluated prior to inductionOxygen Delivery Method: Circle system utilized Preoxygenation: Pre-oxygenation with 100% oxygen Intubation Type: IV induction Ventilation: Mask ventilation without difficulty Laryngoscope Size: Mac and 3 Grade View: Grade II Tube type: Oral Tube size: 7.5 mm Number of attempts: 1 Airway Equipment and Method: Stylet Placement Confirmation: ETT inserted through vocal cords under direct vision,  positive ETCO2,  CO2 detector and breath sounds checked- equal and bilateral Secured at: 21 cm Tube secured with: Tape Dental Injury: Teeth and Oropharynx as per pre-operative assessment

## 2014-12-28 NOTE — H&P (View-Only) (Signed)
Consult Note: Gyn-Onc  Consult was requested by Dr. [name] for the evaluation of [name] 39 y.o. female with a history of colon cancer and a large right ovarian mass.  CC:  Chief Complaint  Patient presents with  . Pelvic mass    New consult    Assessment/Plan:  [name]  is a 39 y.o.  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  I personally reviewed the images from her CT scan from 11/26/14. The mass is smooth and regular externally and is mostly solid. It is not associated with common stigmata of ovarian cancer such as ascites, peritoneal disease or retroperitoneal adenopathy. The CEA is very elevated >300 with a normal CA 125. All of these features are more consistent with a recurrence/metastatic focus rather than a metachronous primary of the ovary.  In order to resect this lesion she will require an exploratory laparotomy given its size and solid nature. She is at risk for surgical complication due to her prior surgery (colectomy and hysterectomy) and because of her very enlarged spleen. I explained that if there is trauma to the spleen intraoperatively there could be severe hemorrhage and splenectomy may be necessary.  I discussed that I will discuss her case with her treating medical and surgical oncologists, Dr [name] and Dr [name], and if metastatectomy is clinically appropriate and beneficial in the course of her treatment, and outweighs risks associated with surgery, we will proceed with ex lap BSO. I have tentatively scheduled this for mid November.  I further discussed surgical risks with the patient including  bleeding, infection, damage to internal organs (such as bladder,ureters, bowels), blood clot, reoperation and rehospitalization.   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  The patient was being followed as part of surveillance after achieving complete response to therapy, and was noted to have a rising CEA level (375 on 11/25/14). Her CA 125 on 11/26/14 was normal at 15.   A CT of the abdomen and pelvis was obtained on 11/26/14 and demonstrated splenomegaly with a 15cm spleen identified, and prominence of the portal and splenic veins. There was development of a large 13cm right pelvic mass measuring 12.7x13x12.6cm mass with mixed cystic and solid components arising from the right adnexa. The left ovary was not visualized.   She denies symptoms or pain.  Her colon cancer was a right sided lesion, MSI negative, and stage IIIc treated with laparoscopic right colectomy with Dr [name] on 08/11/13 and followed by FOLFOX chemotherapy completed  With cycle 12 on 1//14/16. Splenomegaly was first observed in December 2015 with no clear etiology.  She remained NED until October, 2016.  The patient reports a family history of a paternal GF with "bone cancer", a maternal uncle with lung cancer (asbestos related), and a maternal aunt with postmenopausal breast cancer, and a paternal uncle with prostate cancer.  She has a remote history of an abdominal hysterectomy for endometriosis.   She has a history of epilepsy. Last seizure was 3 years ago. She has been prescribed lamictal but admits to not taking this since after her last surgery.   Current Meds:  Outpatient Encounter Prescriptions as of 12/02/2014  Medication Sig  . HYDROcodone-acetaminophen (NORCO/VICODIN) 5-325 MG per tablet Take 1-2 tablets  by mouth every 4 (four) hours as needed for moderate pain or severe pain.  Marland Kitchen LORazepam (ATIVAN) 1 MG tablet Take 1 tablet (1 mg total) by mouth 2 (two) times daily as needed for anxiety or sleep.  Marland Kitchen omeprazole (PRILOSEC OTC) 20 MG tablet Take 20 mg by mouth daily.   . ondansetron (ZOFRAN) 8 MG tablet TAKE ONE TABLET BY MOUTH EVERY 8 HOURS  AS NEEDED FOR NAUSEA OR VOMITING.  . rOPINIRole (REQUIP) 4 MG tablet TAKE ONE TABLET BY MOUTH AT BEDTIME.  . [DISCONTINUED] lamoTRIgine (LAMICTAL) 100 MG tablet TAKE ONE TABLET BY MOUTH TWICE DAILY.   No facility-administered encounter medications on file as of 12/02/2014.    Allergy:  Allergies  Allergen Reactions  . Eggs Or Egg-Derived Products Nausea And Vomiting  . Metoclopramide Hcl Other (See Comments)    EXACERBATES RESTLESS LEG SYNDROME  . Oxycodone-Acetaminophen Hives  . Sulfa Antibiotics Nausea And Vomiting  . Adhesive [Tape] Hives and Rash  . Penicillins Itching    Social Hx:   Social History   Social History  . Marital Status: Married    Spouse Name: N/A  . Number of Children: 2  . Years of Education: N/A   Occupational History  . Unemployed- full time student     Completed 12th grade   Social History Main Topics  . Smoking status: Never Smoker   . Smokeless tobacco: Never Used  . Alcohol Use: No  . Drug Use: No  . Sexual Activity: Yes    Birth Control/ Protection: Surgical   Other Topics Concern  . Not on file   Social History Narrative    Past Surgical Hx:  Past Surgical History  Procedure Laterality Date  . Esophagogastroduodenoscopy  07/07/2013  . Laparoscopic partial colectomy Right 08/11/2013    Procedure: LAPAROSCOPIC RIGHT HEMICOLECTOMY;  Surgeon: Emily Klein, MD;  Location: [facility];  Service: General;  Laterality: Right;  . Colonoscopy with propofol  07/07/2013  . Tubal ligation  11/07/2000  . Endometrial fulguration  11/01/2003  . Laparoscopic lysis of adhesions  11/01/2003  . Cystoscopy  11/01/2003  . Appendectomy  08/24/2004  . Abdominal hysterectomy  08/24/2004    partial  . Unilateral salpingectomy Left 08/24/2004  . Left oophorectomy  08/24/2004  . Panniculectomy  08/24/2004  . Portacath placement Left 09/09/2013    Procedure: INSERTION PORT-A-CATH;  Surgeon: Emily Klein, MD;  Location: [facility];  Service: General;   Laterality: Left;  . Colon surgery  08/11/2013    removed a foot of colon  . Port-a-cath removal Left 06/08/2014    Procedure: REMOVAL PORT-A-CATH;  Surgeon: Emily Klein, MD;  Location: [facility];  Service: General;  Laterality: Left;    Past Medical Hx:  Past Medical History  Diagnosis Date  . GERD (gastroesophageal reflux disease)   . Seizures ([place])     last seizure 08/2012  . History of migraine     no problems in "a long time"  . Anemia, iron deficiency   . Restless leg syndrome   . History of blood transfusion 08/16/2013  . Difficulty swallowing pills   . Anxiety   . Depression   . Headache     migraines "long time ago"  . ADD (attention deficit disorder)   . Colon cancer ([place]) 08/2013    Past Gynecological History:  SVD x 2 No LMP recorded. Patient has had a hysterectomy.  Family Hx:  Family History  Problem Relation Age of Onset  . Heart attack Mother   .  Colon polyps Mother   . Colitis Mother   . Diabetes Father   . Heart attack Father   . Stroke Father   . Cirrhosis Maternal Aunt   . Cancer Maternal Uncle     unknown type  . Colon cancer Neg Hx   . Esophageal cancer Neg Hx   . Rectal cancer Neg Hx   . Stomach cancer Neg Hx     Review of Systems:  Constitutional  Feels well,    ENT Normal appearing ears and nares bilaterally Skin/Breast  No rash, sores, jaundice, itching, dryness Cardiovascular  No chest pain, shortness of breath, or edema  Pulmonary  No cough or wheeze.  Gastro Intestinal  No nausea, vomitting, or diarrhoea. No bright red blood per rectum, no abdominal pain, change in bowel movement, or constipation.  Genito Urinary  No frequency, urgency, dysuria,  Musculo Skeletal  No myalgia, arthralgia, joint swelling or pain  Neurologic  No weakness, numbness, change in gait,  Psychology  No depression, anxiety, insomnia.   Vitals:  Blood pressure 113/65, pulse 83, temperature 98.6 F (37 C), temperature source Oral, resp. rate 16, height 5'  5.16" (1.655 m), weight 174 lb 11.2 oz (79.243 kg).  Physical Exam: WD in NAD Neck  Supple NROM, without any enlargements.  Lymph Node Survey No cervical supraclavicular or inguinal adenopathy Cardiovascular  Pulse normal rate, regularity and rhythm. S1 and S2 normal.  Lungs  Clear to auscultation bilateraly, without wheezes/crackles/rhonchi. Good air movement.  Skin  No rash/lesions/breakdown  Psychiatry  Alert and oriented to person, place, and time  Abdomen  Normoactive bowel sounds, abdomen soft, non-tender and obese without evidence of hernia. There is a firm, mobile mass in the central low pelvis that extends to the mid point between umbilicus and pubic symphysis. Back No CVA tenderness Genito Urinary  Vulva/vagina: Normal external female genitalia.  No lesions. No discharge or bleeding.  Bladder/urethra:  No lesions or masses, well supported bladder  Vagina: grossly normal. Mass not appreciated on vaginal exam.  Cervix: surgically absent  Uterus: surgically absent  Adnexa: no discretely palpable masses. Rectal  Good tone, no masses no cul de sac nodularity.  Extremities  No bilateral cyanosis, clubbing or edema.   [name], MD  12/02/2014, 4:07 PM

## 2014-12-28 NOTE — Interval H&P Note (Signed)
History and Physical Interval Note:  12/28/2014 2:04 PM  [name]  has presented today for surgery, with the diagnosis of PELVIC MASS  The various methods of treatment have been discussed with the patient and family. After consideration of risks, benefits and other options for treatment, the patient has consented to  Procedure(s): EXPLORATORY LAPAROTOMY (N/A) BILATERAL  SALPINGO OOPHORECTOMY (Bilateral) as a surgical intervention .  The patient's history has been reviewed, patient examined, no change in status, stable for surgery.  I have reviewed the patient's chart and labs.  Questions were answered to the patient's satisfaction.     [name]

## 2014-12-29 ENCOUNTER — Encounter (HOSPITAL_COMMUNITY): Payer: Self-pay | Admitting: Gynecologic Oncology

## 2014-12-29 LAB — BASIC METABOLIC PANEL
ANION GAP: 7 (ref 5–15)
BUN: 9 mg/dL (ref 6–20)
CALCIUM: 9.2 mg/dL (ref 8.9–10.3)
CHLORIDE: 105 mmol/L (ref 101–111)
CO2: 26 mmol/L (ref 22–32)
CREATININE: 0.77 mg/dL (ref 0.44–1.00)
GFR calc non Af Amer: >60 mL/min (ref >60)
Glucose, Bld: 179 mg/dL — ABNORMAL HIGH (ref 65–99)
Potassium: 4.6 mmol/L (ref 3.5–5.1)
SODIUM: 138 mmol/L (ref 135–145)

## 2014-12-29 LAB — CBC
HCT: 32.8 % — ABNORMAL LOW (ref 36.0–46.0)
HEMOGLOBIN: 10.9 g/dL — AB (ref 12.0–15.0)
MCH: 27.3 pg (ref 26.0–34.0)
MCHC: 33.2 g/dL (ref 30.0–36.0)
MCV: 82.2 fL (ref 78.0–100.0)
Platelets: 131 10*3/uL — ABNORMAL LOW (ref 150–400)
RBC: 3.99 MIL/uL (ref 3.87–5.11)
RDW: 14.3 % (ref 11.5–15.5)
WBC: 6.4 10*3/uL (ref 4.0–10.5)

## 2014-12-29 NOTE — Care Management Note (Signed)
Case Management Note  Patient Details  Name: [name] MRN: [id number] Date of Birth: 03/09/1975  Subjective/Objective:      S/p Exploratory laparotomy with right salpingo-oophorectomy, radical tumor debulking for ovarian cancer .              Action/Plan: Discharge planning, no new needs identified, will continue to follow  Expected Discharge Date:                  Expected Discharge Plan:  Home/Self Care  In-House Referral:  NA  Discharge planning Services  CM Consult  Post Acute Care Choice:  NA Choice offered to:  NA  DME Arranged:  N/A DME Agency:  NA  HH Arranged:  NA HH Agency:  NA  Status of Service:  Completed, signed off  Medicare Important Message Given:    Date Medicare IM Given:    Medicare IM give by:    Date Additional Medicare IM Given:    Additional Medicare Important Message give by:     If discussed at [place] of Stay Meetings, dates discussed:    Additional Comments:  Guadalupe Maple, RN 12/29/2014, 10:36 AM

## 2014-12-29 NOTE — Progress Notes (Signed)
1 Day Post-Op Procedure(s) (LRB): EXPLORATORY LAPAROTOMY (N/A) RIGHT SALPINGO OOPHORECTOMY (Right)  Subjective: Patient reports feeling well, no emesis, tolerating PO, voiding.    Objective: Vital signs in last 24 hours: Temp:  [97.6 F (36.4 C)-98.9 F (37.2 C)] 98.6 F (37 C) (11/23 0634) Pulse Rate:  [68-78] 75 (11/23 0634) Resp:  [14-17] 16 (11/23 0634) BP: (96-129)/(45-86) 104/58 mmHg (11/23 0634) SpO2:  [96 %-100 %] 97 % (11/23 0634) Weight:  [174 lb (78.926 kg)] 174 lb (78.926 kg) (11/22 1125) Last BM Date: 12/26/14  Intake/Output from previous day: 11/22 0701 - 11/23 0700 In: 3700 [P.O.:1200; I.V.:2500] Out: 3310 [Urine:3110; Blood:200]  Physical Examination: General: alert and cooperative Resp: clear to auscultation bilaterally Cardio: regular rate and rhythm, S1, S2 normal, no murmur, click, rub or gallop GI: soft, non-tender; bowel sounds normal; no masses,  no organomegaly and incision: clean, dry and intact  Labs: WBC/Hgb/Hct/Plts:  6.4/10.9/32.8/131 (11/23 0423) BUN/Cr/glu/ALT/AST/amyl/lip:  9/0.77/--/--/--/--/-- (11/23 0423)   Assessment:  39 y.o. s/p Procedure(s): EXPLORATORY LAPAROTOMY RIGHT SALPINGO OOPHORECTOMY: stable Pain:  Pain is well-controlled on PCA or oral medications.  Heme:Anemia: acute blood loss anemia, appropriate for EBL. No signs of ongoing bleeding.  GI:  Tolerating po: Yes   Advance diet  FEN: hep lock IVF.  Prophylaxis: pharmacologic prophylaxis (with any of the following: enoxaparin (Lovenox) 40mg  SQ 2 hours prior to surgery then every day). Will send home on this for 28 days.  Plan: Advance diet Encourage ambulation Advance to PO medication Discontinue IV fluids Dispo:  Discharge plan to include : The patient is to be discharged to home.   LOS: 1 day    [name] 12/29/2014, 9:28 AM

## 2014-12-30 MED ORDER — BISACODYL 10 MG RE SUPP: 10.0000 mg | RECTAL | Status: DC | PRN

## 2014-12-30 MED ORDER — ENOXAPARIN (LOVENOX) PATIENT EDUCATION KIT
PACK | Freq: Once | Status: AC
  Administered 2014-12-30: 10:00:00
  Filled 2014-12-30: qty 1

## 2014-12-30 MED ORDER — ACETAMINOPHEN 500 MG PO TABS: 1000.0000 mg | ORAL_TABLET | Freq: Four times a day (QID) | ORAL | Status: DC

## 2014-12-30 MED ORDER — IBUPROFEN 800 MG PO TABS
800.0000 mg | ORAL_TABLET | Freq: Four times a day (QID) | ORAL | Status: DC
Start: 2014-12-30 — End: 2015-01-11

## 2014-12-30 MED ORDER — ENOXAPARIN SODIUM 40 MG/0.4ML ~~LOC~~ SOLN: 40.0000 mg | SUBCUTANEOUS | Status: DC

## 2014-12-30 MED ORDER — HYDROMORPHONE HCL 2 MG PO TABS: 2.0000 mg | ORAL_TABLET | ORAL | Status: DC | PRN

## 2014-12-30 NOTE — Discharge Summary (Signed)
Physician Discharge Summary  Patient ID: [name] MRN: [id number] DOB/AGE: 05/25/1975 39 y.o.  Admit date: 12/28/2014 Discharge date: 12/30/2014  Admission Diagnoses: <principal problem not specified>  Discharge Diagnoses:  Active Problems:   Ovarian mass, right   Pelvic mass in female   Discharged Condition: good  Hospital Course: Patient was admitted on 12/28/14 for exploratory laparotomy, and right salpingo-oophorectomy (debulking of tumor) for a recurrent colon cancer. Her surgery was uncomplicated. She did well postoperatively with no issues and on POD 2 was meeting discharge criteria, tolerating PO, feeling well with pain well controlled.  Consults: None  Significant Diagnostic Studies: labs:  CBC    Component Value Date/Time   WBC 6.4 12/29/2014 0423   WBC 4.5 11/25/2014 1609   RBC 3.99 12/29/2014 0423   RBC 4.61 11/25/2014 1609   HGB 10.9* 12/29/2014 0423   HGB 12.8 11/25/2014 1609   HCT 32.8* 12/29/2014 0423   HCT 37.8 11/25/2014 1609   PLT 131* 12/29/2014 0423   PLT 102* 11/25/2014 1609   MCV 82.2 12/29/2014 0423   MCV 82.0 11/25/2014 1609   MCH 27.3 12/29/2014 0423   MCH 27.8 11/25/2014 1609   MCHC 33.2 12/29/2014 0423   MCHC 33.9 11/25/2014 1609   RDW 14.3 12/29/2014 0423   RDW 14.0 11/25/2014 1609   LYMPHSABS 1.1 12/20/2014 1355   LYMPHSABS 1.0 11/25/2014 1609   MONOABS 0.3 12/20/2014 1355   MONOABS 0.3 11/25/2014 1609   EOSABS 0.0 12/20/2014 1355   EOSABS 0.0 11/25/2014 1609   BASOSABS 0.0 12/20/2014 1355   BASOSABS 0.0 11/25/2014 1609      Treatments: surgery: see above  Discharge Exam: Blood pressure 128/69, pulse 81, temperature 98.7 F (37.1 C), temperature source Oral, resp. rate 16, height 5' 6.5" (1.689 m), weight 174 lb (78.926 kg), SpO2 100 %. General appearance: alert and cooperative Resp: clear to auscultation bilaterally Cardio: regular rate and rhythm, S1, S2 normal, no murmur, click, rub or gallop GI: slightly  distended, bowel sounds present Incision/Wound: clean/dry and intact with dressing on (to stay on for 7 days postop).   Disposition: 01-Home or Self Care with Lovenox for 28 days postop  Discharge Instructions    (HEART FAILURE PATIENTS) Call MD:  Anytime you have any of the following symptoms: 1) 3 pound weight gain in 24 hours or 5 pounds in 1 week 2) shortness of breath, with or without a dry hacking cough 3) swelling in the hands, feet or stomach 4) if you have to sleep on extra pillows at night in order to breathe.    Complete by:  As directed      Call MD for:  difficulty breathing, headache or visual disturbances    Complete by:  As directed      Call MD for:  extreme fatigue    Complete by:  As directed      Call MD for:  hives    Complete by:  As directed      Call MD for:  persistant dizziness or light-headedness    Complete by:  As directed      Call MD for:  persistant nausea and vomiting    Complete by:  As directed      Call MD for:  redness, tenderness, or signs of infection (pain, swelling, redness, odor or green/yellow discharge around incision site)    Complete by:  As directed      Call MD for:  severe uncontrolled pain    Complete by:  As directed      Call MD for:  temperature >100.4    Complete by:  As directed      Diet - low sodium heart healthy    Complete by:  As directed      Diet general    Complete by:  As directed      Driving Restrictions    Complete by:  As directed   No driving for 7 days or until off narcotic pain medication     Increase activity slowly    Complete by:  As directed      Remove dressing in 24 hours    Complete by:  As directed      Sexual Activity Restrictions    Complete by:  As directed   No intercourse for 6 weeks            Medication List    TAKE these medications        acetaminophen 500 MG tablet  Commonly known as:  TYLENOL  Take 2 tablets (1,000 mg total) by mouth every 6 (six) hours.     bisacodyl 10 MG  suppository  Commonly known as:  DULCOLAX  Place 1 suppository (10 mg total) rectally as needed for moderate constipation.     enoxaparin 40 MG/0.4ML injection  Commonly known as:  LOVENOX  Inject 0.4 mLs (40 mg total) into the skin daily.     HYDROcodone-acetaminophen 5-325 MG tablet  Commonly known as:  NORCO/VICODIN  Take 1-2 tablets by mouth every 4 (four) hours as needed for moderate pain or severe pain.     HYDROmorphone 2 MG tablet  Commonly known as:  DILAUDID  Take 1 tablet (2 mg total) by mouth every 4 (four) hours as needed for severe pain.     ibuprofen 800 MG tablet  Commonly known as:  ADVIL,MOTRIN  Take 1 tablet (800 mg total) by mouth every 6 (six) hours.     LORazepam 1 MG tablet  Commonly known as:  ATIVAN  Take 1 tablet (1 mg total) by mouth 2 (two) times daily as needed for anxiety or sleep.     omeprazole 20 MG tablet  Commonly known as:  PRILOSEC OTC  Take 20 mg by mouth daily.     ondansetron 8 MG tablet  Commonly known as:  ZOFRAN  TAKE ONE TABLET BY MOUTH EVERY 8 HOURS AS NEEDED FOR NAUSEA OR VOMITING.     rOPINIRole 4 MG tablet  Commonly known as:  REQUIP  TAKE ONE TABLET BY MOUTH AT BEDTIME.           Follow-up Information    Follow up with [name], MD In 2 weeks.   Specialty:  Obstetrics and Gynecology   Contact information:   [address] [place] [address] [phone]       Signed: [name] 12/30/2014, 8:45 AM

## 2014-12-30 NOTE — Discharge Instructions (Signed)
12/30/2014  Return to work: 4 weeks  Activity: 1. Be up and out of the bed during the day.  Take a nap if needed.  You may walk up steps but be careful and use the hand rail.  Stair climbing will tire you more than you think, you may need to stop part way and rest.   2. No lifting or straining for 6 weeks.  3. No driving for 2 weeks.  Do Not drive if you are taking narcotic pain medicine.  4. Shower daily.  Use soap and water on your incision and pat dry; don't rub.   5. No sexual activity and nothing in the vagina for 2 weeks.  6. Take lovenox shots once daily for 1 month postop.  Diet: 1. Low sodium Heart Healthy Diet is recommended.  2. It is safe to use a laxative if you have difficulty moving your bowels.   Wound Care: 1. Keep clean and dry.  Shower daily.  Reasons to call the Doctor:   Fever - Oral temperature greater than 100.4 degrees Fahrenheit  Foul-smelling vaginal discharge  Difficulty urinating  Nausea and vomiting  Increased pain at the site of the incision that is unrelieved with pain medicine.  Difficulty breathing with or without chest pain  New calf pain especially if only on one side  Sudden, continuing increased vaginal bleeding with or without clots.   Follow-up: 1. See Everitt Amber in 2-3 weeks.  Contacts: For questions or concerns you should contact:  Dr. Everitt Amber at [phone]  or at [place]

## 2014-12-30 NOTE — Progress Notes (Signed)
Patient is alert and oriented. VS stable. No s/s of acute distress. NO PRN pain medication needed. Patient being discharged to home. Reviewed discharge education, medication and information. Reviewed lovenox patient information supply kit. Patient states understanding all information with no questions. Hydromorphone 23m prescription given to patient.

## 2014-12-31 ENCOUNTER — Telehealth: Payer: Self-pay

## 2014-12-31 NOTE — Telephone Encounter (Signed)
Patient called with questions about her surgical dressing and when it should be changed . Writer informed the patient that the dressing should stay on for 5-7 days . Writer also informed the patient that it is safe to take a shower ,but no tub baths , pools or hot tubes are allowed . Patient states understanding , denies further questions or concerns at this time , will call any changes ,questions or concerns.

## 2015-01-03 ENCOUNTER — Ambulatory Visit: Payer: Self-pay | Admitting: Nurse Practitioner

## 2015-01-04 ENCOUNTER — Telehealth: Payer: Self-pay | Admitting: Gynecologic Oncology

## 2015-01-04 NOTE — Telephone Encounter (Signed)
Informed patient of finding or recurrent colon cancer in right ovarian specimen. I discussed that the treatment plan will be determined by Dr Benay Spice for this.  She states she is doing well.  [name], MD

## 2015-01-11 ENCOUNTER — Encounter: Payer: Self-pay | Admitting: *Deleted

## 2015-01-11 ENCOUNTER — Ambulatory Visit (HOSPITAL_BASED_OUTPATIENT_CLINIC_OR_DEPARTMENT_OTHER): Payer: Self-pay | Admitting: Oncology

## 2015-01-11 ENCOUNTER — Telehealth: Payer: Self-pay | Admitting: Oncology

## 2015-01-11 VITALS — BP 124/67 | HR 64 | Temp 98.3°F | Resp 18 | Ht 66.5 in | Wt 172.7 lb

## 2015-01-11 DIAGNOSIS — D696 Thrombocytopenia, unspecified: Secondary | ICD-10-CM

## 2015-01-11 DIAGNOSIS — G620 Drug-induced polyneuropathy: Secondary | ICD-10-CM

## 2015-01-11 DIAGNOSIS — C182 Malignant neoplasm of ascending colon: Secondary | ICD-10-CM

## 2015-01-11 NOTE — Telephone Encounter (Signed)
s.w. pt and advised on jan appt...Marland KitchenMarland KitchenRobinson not here on 1.5

## 2015-01-11 NOTE — Progress Notes (Signed)
[facility] OFFICE PROGRESS NOTE   Diagnosis: Colon cancer  INTERVAL HISTORY:   [name] returns as scheduled. She was taken to the operating room by Drs. Denman George and [place] on 12/28/2014 for removal of the right ovarian mass. A 15 cm solid right ovarian mass was noted adherent to the right sidewall. No residual disease following resection. Normal liver to palpation. 200 mL of ascites were noted. 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 The fallopian tube was unremarkable.  She reports an uneventful operative recovery. She continues prophylactic Lovenox.  Objective:  Vital signs in last 24 hours:  Blood pressure 124/67, pulse 64, temperature 98.3 F (36.8 C), temperature source Oral, resp. rate 18, height 5' 6.5" (1.689 m), weight 172 lb 11.2 oz (78.336 kg), SpO2 100 %.    HEENT: Neck without mass Lymphatics: No cervical, supra-clavicular, axillary, or inguinal nodes Resp: Lungs clear bilaterally Cardio: Regular rate and rhythm GI: No hepatomegaly, no mass, healing midline incision Vascular: No leg edema   Lab Results:  Lab Results  Component Value Date   WBC 6.4 12/29/2014   HGB 10.9* 12/29/2014   HCT 32.8* 12/29/2014   MCV 82.2 12/29/2014   PLT 131* 12/29/2014   NEUTROABS 3.1 12/20/2014      Lab Results  Component Value Date   CEA 376.8* 11/25/2014     Medications: I have reviewed the patient's current medications.  Assessment: 1. 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  The tumor returned microsatellite stable with no loss of mismatch repair protein expression   APC mutated. No BRAF, KRAS, or NRAS mutation On Foundation 1 testing   Cycle 1 adjuvant FOLFOX 09/08/2013   Cycle 2 adjuvant FOLFOX 09/24/2013   Cycle 3 adjuvant FOLFOX  10/08/2013.   Cycle 4 adjuvant FOLFOX 10/22/2013.   Cycle 5 adjuvant FOLFOX 11/05/2013. Oxaliplatin held due to thrombocytopenia.  Cycle 6 FOLFOX 11/19/2013.  Cycle 7 FOLFOX 12/03/2013. Oxaliplatin held secondary to thrombocytopenia.  Cycle 8 FOLFOX 12/17/2013.  Cycle 9 FOLFOX 01/04/2014. Oxaliplatin held secondary to neutropenia.   Cycle 10 FOLFOX 01/21/2014. Oxaliplatin held secondary to thrombocytopenia.  Cycle 11 FOLFOX 02/04/2014  Cycle 12 FOLFOX 02/18/2014, oxaliplatin dose reduced secondary to thrombocytopenia  CT abdomen/pelvis 01/30/2014 revealed splenomegaly and no evidence of recurrent colon cancer  CT chest 04/07/2014 with a stable right lower lobe nodule and no evidence for metastatic disease, no nodules seen on the CT 11/26/2014  Markedly elevated CEA 11/24/2014  CT 11/26/2014 revealed a right pelvic mass, splenomegaly, small volume ascites 2. Mild elevation of the CEA beginning January 2016 , normal on 05/19/2014  3. History of iron deficiency anemia  4. seizure disorder  5. history of depression  6. 4 mm right lower lobe nodule on a staging chest CT 09/08/2013 , stable on a CT 04/07/2014 7. Hospitalization 09/24/2013 through 09/26/2013 with fever and abdominal pain.  8. 09/24/2013 urine culture positive for coag negative staph.  9. 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 Splenomegaly noted on a CT scan 01/30/2014, the spleen is not palpable today 12. Left arm stiffness and discomfort at the Port-A-Cath site- low clinical suspicion for a Port-A-Cath related DVT or infection 13. Port-A-Cath removed 06/08/2014 14.   Disposition:  [name] is recovering from the oophorectomy procedure. The surgery confirmed involvement of the right ovary with metastatic colon cancer. I explained the  pathology to her. We discussed the role for systemic therapy. She was less than 1 year out  from completing adjuvant FOLFOX chemotherapy when the ovarian mass was identified. There is no clear role for "adjuvant" chemotherapy in this setting.  The plan is to follow her with observation. She will return for an office visit and CEA in one month.  I will refer her to the research department to consider enrollment on the [employer] colorectal tissue bank study.    Betsy Coder, MD  01/11/2015  1:51 PM

## 2015-01-11 NOTE — Progress Notes (Signed)
Oncology Nurse Navigator Documentation  Oncology Nurse Navigator Flowsheets 01/11/2015  Navigator Encounter Type Other  Patient Visit Type Medonc  Treatment Phase Other-observation  Barriers/Navigation Needs Financial  Education Concerns with Insurance Coverage-no insurance; hopes to return to work in future  Interventions None required-CSW aware of financial issues  Time Spent with Patient 15

## 2015-01-13 ENCOUNTER — Ambulatory Visit: Payer: MEDICAID | Attending: Gynecologic Oncology | Admitting: Gynecologic Oncology

## 2015-01-13 ENCOUNTER — Encounter: Payer: Self-pay | Admitting: *Deleted

## 2015-01-13 ENCOUNTER — Encounter: Payer: Self-pay | Admitting: Gynecologic Oncology

## 2015-01-13 VITALS — BP 119/70 | HR 69 | Temp 98.3°F | Resp 18 | Ht 66.5 in | Wt 168.5 lb

## 2015-01-13 DIAGNOSIS — E8940 Asymptomatic postprocedural ovarian failure: Secondary | ICD-10-CM

## 2015-01-13 DIAGNOSIS — C7961 Secondary malignant neoplasm of right ovary: Secondary | ICD-10-CM

## 2015-01-13 DIAGNOSIS — N958 Other specified menopausal and perimenopausal disorders: Secondary | ICD-10-CM | POA: Insufficient documentation

## 2015-01-13 DIAGNOSIS — C799 Secondary malignant neoplasm of unspecified site: Secondary | ICD-10-CM | POA: Insufficient documentation

## 2015-01-13 MED ORDER — ESTRADIOL 0.5 MG PO TABS: 0.5000 mg | ORAL_TABLET | Freq: Every day | ORAL | Status: DC

## 2015-01-13 NOTE — Patient Instructions (Signed)
It is safe to resume intercourse.

## 2015-01-13 NOTE — Progress Notes (Signed)
  POSTOPERATIVE FOLLOWUP  HPI:  [name] is a 39 y.o. year old No obstetric history on file. initially seen in consultation on 12/02/14 referred by Dr Benay Spice for a right ovarian mass and elevated CEA in the setting of a history of stage III right sided colon cancer.  She then underwent an ex lap, RSO on [id number] without complications. There was no gross residual disease at the completion of the surgery. Her postoperative course was uncomplicated.  Her final pathology revealed metastatic adenocarcinoma, intestinal type, consistent with a recurrence of her colon cancer.  She is seen today for a postoperative check and to discuss her pathology results and ongoing plan.  Since discharge from the hospital, she is feeling well. She denies postmenopausal symptoms and has not started estrogen replacement as she lacks insurance to cover this. She has been taking lovenox shots.  She has improving appetite, normal bowel and bladder function, and pain controlled with minimal PO medication. She has no other complaints today.   Review of systems: Constitutional:  She has no weight gain or weight loss. She has no fever or chills. Eyes: No blurred vision Ears, Nose, Mouth, Throat: No dizziness, headaches or changes in hearing. No mouth sores. Cardiovascular: No chest pain, palpitations or edema. Respiratory:  No shortness of breath, wheezing or cough Gastrointestinal: She has normal bowel movements without diarrhea or constipation. She denies any nausea or vomiting. She denies blood in her stool or heart burn. Genitourinary:  She denies pelvic pain, pelvic pressure or changes in her urinary function. She has no hematuria, dysuria, or incontinence. She has no irregular vaginal bleeding or vaginal discharge Musculoskeletal: Denies muscle weakness or joint pains.  Skin:  She has no skin changes, rashes or itching Neurological:  Denies dizziness or headaches. No neuropathy, no numbness or  tingling. Psychiatric:  She denies depression or anxiety. Hematologic/Lymphatic:   No easy bruising or bleeding   Physical Exam: Blood pressure 119/70, pulse 69, temperature 98.3 F (36.8 C), temperature source Oral, resp. rate 18, height 5' 6.5" (1.689 m), weight 168 lb 8 oz (76.431 kg), SpO2 99 %. General: Well dressed, well nourished in no apparent distress.   HEENT:  Normocephalic and atraumatic, no lesions.  Extraocular muscles intact. Sclerae anicteric. Pupils equal, round, reactive. No mouth sores or ulcers. Thyroid is normal size, not nodular, midline. Skin:  No lesions or rashes. Abdomen:  Soft, nontender, nondistended.  No palpable masses.  No hepatosplenomegaly.  No ascites. Normal bowel sounds.  No hernias.  Incision is well healed. Genitourinary: Normal EGBUS  Vaginal cuff intact.  No bleeding or discharge.  No cul de sac fullness. Extremities: No cyanosis, clubbing or edema.  No calf tenderness or erythema. No palpable cords. Psychiatric: Mood and affect are appropriate. Neurological: Awake, alert and oriented x 3. Sensation is intact, no neuropathy.  Musculoskeletal: No pain, normal strength and range of motion.  Assessment:    39 y.o. year old with recurrent colon cancer in the right ovary.   S/p right salpingo-oophorectomy, radical tumor debulking to no residual macroscopic disease on 01/13/15.   Plan: 1) Pathology reports reviewed today - treatment determinations will be left to Dr Benay Spice 2) Surgical menopause: prescribed estradiol orally to take prn. She was given the opportunity to ask questions, which were answered to her satisfaction, and she is agreement with the above mentioned plan of care.  3)  Return to clinic on a prn basis  [name], MD

## 2015-01-27 ENCOUNTER — Ambulatory Visit: Payer: Medicaid Other

## 2015-01-27 ENCOUNTER — Ambulatory Visit (HOSPITAL_BASED_OUTPATIENT_CLINIC_OR_DEPARTMENT_OTHER): Payer: Self-pay | Admitting: Nurse Practitioner

## 2015-01-27 ENCOUNTER — Telehealth: Payer: Self-pay | Admitting: *Deleted

## 2015-01-27 VITALS — BP 125/77 | HR 73 | Temp 98.3°F | Resp 18 | Ht 66.5 in | Wt 169.2 lb

## 2015-01-27 DIAGNOSIS — C182 Malignant neoplasm of ascending colon: Secondary | ICD-10-CM

## 2015-01-27 DIAGNOSIS — C7960 Secondary malignant neoplasm of unspecified ovary: Secondary | ICD-10-CM

## 2015-01-27 DIAGNOSIS — J0110 Acute frontal sinusitis, unspecified: Secondary | ICD-10-CM

## 2015-01-27 MED ORDER — AZITHROMYCIN 250 MG PO TABS: ORAL_TABLET | ORAL | Status: DC

## 2015-01-27 NOTE — Telephone Encounter (Addendum)
Call from [place] reporting she "lost her voice last weekend followed by nasal congestion, cough with yellow mucus.  It feels like congestion going in my chest now."  Temp checked during this call = 97.2 at 1018.  "Eating and drinking wnl.  I do not have a PCP, using Dr. Benay Spice."  Will schedule with [facility].  Advised lab at 2:30 om and F/U at 3:00.  Zafina working on transportation and will call if she cannot arrange.  Lives in [place] reporting "boyfriend gets off work at 3:00 pm".

## 2015-01-29 ENCOUNTER — Encounter: Payer: Self-pay | Admitting: Nurse Practitioner

## 2015-01-29 DIAGNOSIS — J329 Chronic sinusitis, unspecified: Secondary | ICD-10-CM | POA: Insufficient documentation

## 2015-01-29 NOTE — Progress Notes (Signed)
SYMPTOM MANAGEMENT CLINIC   HPI: [name] 39 y.o. female diagnosed with colon cancer.  Recently diagnosed with right ovarian metastasis as well.  Currently recovering from a recent oophrectomy.  C/o URI symptoms which include nasal and congestion, sinus drainage, occasional sore throat.  She denies any productive cough.  She denies any chest pain, chest pressure, shortness breath or pain with inspiration.  She denies any recent fevers or chills.  Exam reveals some mild nasal congestion and tenderness to both her frontal and maxillary sinuses.  Posterior oropharynx clear with no erythema or exudate. HPI  ROS  Past Medical History  Diagnosis Date  . GERD (gastroesophageal reflux disease)   . Seizures ([place])     last seizure 08/2012  . History of migraine     no problems in "a long time"  . Anemia, iron deficiency   . Restless leg syndrome   . History of blood transfusion 08/16/2013  . Difficulty swallowing pills   . Anxiety   . Depression   . Headache     migraines "long time ago"  . ADD (attention deficit disorder)   . Colon cancer ([place]) 08/2013    Past Surgical History  Procedure Laterality Date  . Esophagogastroduodenoscopy  07/07/2013  . Laparoscopic partial colectomy Right 08/11/2013    Procedure: LAPAROSCOPIC RIGHT HEMICOLECTOMY;  Surgeon: Stark Klein, MD;  Location: [facility];  Service: General;  Laterality: Right;  . Colonoscopy with propofol  07/07/2013  . Tubal ligation  11/07/2000  . Endometrial fulguration  11/01/2003  . Laparoscopic lysis of adhesions  11/01/2003  . Cystoscopy  11/01/2003  . Appendectomy  08/24/2004  . Abdominal hysterectomy  08/24/2004    partial  . Unilateral salpingectomy Left 08/24/2004  . Left oophorectomy  08/24/2004  . Panniculectomy  08/24/2004  . Portacath placement Left 09/09/2013    Procedure: INSERTION PORT-A-CATH;  Surgeon: Stark Klein, MD;  Location: [facility];  Service: General;  Laterality: Left;  . Colon surgery   08/11/2013    removed a foot of colon  . Port-a-cath removal Left 06/08/2014    Procedure: REMOVAL PORT-A-CATH;  Surgeon: Stark Klein, MD;  Location: [facility];  Service: General;  Laterality: Left;  . Colonoscopy 10-18-14    . Laparotomy N/A 12/28/2014    Procedure: EXPLORATORY LAPAROTOMY;  Surgeon: Everitt Amber, MD;  Location: [facility];  Service: Gynecology;  Laterality: N/A;  . Salpingoophorectomy Right 12/28/2014    Procedure: RIGHT SALPINGO OOPHORECTOMY;  Surgeon: Everitt Amber, MD;  Location: [facility];  Service: Gynecology;  Laterality: Right;    has HYPERLIPIDEMIA; IRON DEFICIENCY; DEPRESSION/ANXIETY; RESTLESS LEG SYNDROME; GERD; Gastroparesis; IBS; FATTY LIVER DISEASE; OVARIAN CYST; TRANSAMINASES, SERUM, ELEVATED; ANA POSITIVE; LIBIDO, DECREASED; Right wrist pain; ADD (attention deficit disorder); Fatigue; Hyperglycemia; Migraine; Medication reaction; Discoloration of skin of face; 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[place]); Anemia, iron deficiency; Cancer of ascending colon s/p right colectomy 08/11/2013; Abdominal pain; Anemia due to antineoplastic chemotherapy; Chemotherapy induced thrombocytopenia; Hypomagnesemia; Hypophosphatemia; Adverse drug effect; Diarrhea; Malfunction of device; Weight loss; Mucositis (ulcerative) due to antineoplastic therapy; Fever; Dehydration; Nausea; Hypokalemia; Ovarian mass, right; Pelvic mass in female; and Sinusitis on her problem list.    is allergic to adhesive; eggs or egg-derived products; metoclopramide hcl; oxycodone-acetaminophen; sulfa antibiotics; and penicillins.    Medication List       This list is accurate as of: 01/27/15 11:59 PM.  Always use your most recent med list.  azithromycin 250 MG tablet  Commonly known as:  ZITHROMAX Z-PAK  Z pak: take as directed.     estradiol 0.5 MG tablet  Commonly known as:  ESTRACE  Take 1 tablet (0.5 mg total) by mouth daily.     HYDROcodone-acetaminophen 5-325 MG tablet  Commonly known as:   NORCO/VICODIN  Take 1-2 tablets by mouth every 4 (four) hours as needed for moderate pain or severe pain.     HYDROmorphone 2 MG tablet  Commonly known as:  DILAUDID  Take 1 tablet (2 mg total) by mouth every 4 (four) hours as needed for severe pain.     LORazepam 1 MG tablet  Commonly known as:  ATIVAN  Take 1 tablet (1 mg total) by mouth 2 (two) times daily as needed for anxiety or sleep.     ondansetron 8 MG tablet  Commonly known as:  ZOFRAN  TAKE ONE TABLET BY MOUTH EVERY 8 HOURS AS NEEDED FOR NAUSEA OR VOMITING.         PHYSICAL EXAMINATION  Oncology Vitals 01/27/2015 01/13/2015  Height 169 cm 169 cm  Weight 76.749 kg 76.431 kg  Weight (lbs) 169 lbs 3 oz 168 lbs 8 oz  BMI (kg/m2) 26.9 kg/m2 26.79 kg/m2  Temp 98.3 98.3  Pulse 73 69  Resp 18 18  SpO2 100 99  BSA (m2) 1.9 m2 1.89 m2  Some encounter information is confidential and restricted. Go to Review Flowsheets activity to see all data.   BP Readings from Last 2 Encounters:  01/27/15 125/77  01/13/15 119/70    Physical Exam  Constitutional: She is oriented to person, place, and time and well-developed, well-nourished, and in no distress.  HENT:  Head: Normocephalic and atraumatic.  Mouth/Throat: Oropharynx is clear and moist.  Mild nasal congestion on exam.  Moderate tenderness to both frontal and maxillary sinuses.  Eyes: Conjunctivae and EOM are normal. Pupils are equal, round, and reactive to light. Right eye exhibits no discharge. Left eye exhibits no discharge. No scleral icterus.  Neck: Normal range of motion. Neck supple. No JVD present. No tracheal deviation present. No thyromegaly present.  Cardiovascular: Normal rate, regular rhythm, normal heart sounds and intact distal pulses.   Pulmonary/Chest: Effort normal and breath sounds normal. No respiratory distress. She has no wheezes. She has no rales. She exhibits no tenderness.  Abdominal: Soft. Bowel sounds are normal. She exhibits no distension and no  mass. There is no tenderness. There is no rebound and no guarding.  Musculoskeletal: Normal range of motion. She exhibits no edema or tenderness.  Lymphadenopathy:    She has no cervical adenopathy.  Neurological: She is alert and oriented to person, place, and time. Gait normal.  Skin: Skin is warm and dry. No rash noted. No erythema. No pallor.  Psychiatric: Affect normal.  Nursing note and vitals reviewed.   LABORATORY DATA:. No visits with results within 3 Day(s) from this visit. Latest known visit with results is:  Admission on 12/28/2014, Discharged on 12/30/2014  Component Date Value Ref Range Status  . WBC 12/29/2014 6.4  4.0 - 10.5 K/uL Final  . RBC 12/29/2014 3.99  3.87 - 5.11 MIL/uL Final  . Hemoglobin 12/29/2014 10.9* 12.0 - 15.0 g/dL Final  . HCT 12/29/2014 32.8* 36.0 - 46.0 % Final  . MCV 12/29/2014 82.2  78.0 - 100.0 fL Final  . MCH 12/29/2014 27.3  26.0 - 34.0 pg Final  . MCHC 12/29/2014 33.2  30.0 - 36.0 g/dL Final  . RDW 12/29/2014  14.3  11.5 - 15.5 % Final  . Platelets 12/29/2014 131* 150 - 400 K/uL Final  . Sodium 12/29/2014 138  135 - 145 mmol/L Final  . Potassium 12/29/2014 4.6  3.5 - 5.1 mmol/L Final  . Chloride 12/29/2014 105  101 - 111 mmol/L Final  . CO2 12/29/2014 26  22 - 32 mmol/L Final  . Glucose, Bld 12/29/2014 179* 65 - 99 mg/dL Final  . BUN 12/29/2014 9  6 - 20 mg/dL Final  . Creatinine, Ser 12/29/2014 0.77  0.44 - 1.00 mg/dL Final  . Calcium 12/29/2014 9.2  8.9 - 10.3 mg/dL Final  . GFR calc non Af Amer 12/29/2014 >60  >60 mL/min Final  . GFR calc Af Amer 12/29/2014 >60  >60 mL/min Final   Comment: (NOTE) The eGFR has been calculated using the CKD EPI equation. This calculation has not been validated in all clinical situations. eGFR's persistently <60 mL/min signify possible Chronic Kidney Disease.   . Anion gap 12/29/2014 7  5 - 15 Final  . WBC 12/28/2014 7.5  4.0 - 10.5 K/uL Final  . RBC 12/28/2014 4.30  3.87 - 5.11 MIL/uL Final  .  Hemoglobin 12/28/2014 11.8* 12.0 - 15.0 g/dL Final  . HCT 12/28/2014 35.8* 36.0 - 46.0 % Final  . MCV 12/28/2014 83.3  78.0 - 100.0 fL Final  . MCH 12/28/2014 27.4  26.0 - 34.0 pg Final  . MCHC 12/28/2014 33.0  30.0 - 36.0 g/dL Final  . RDW 12/28/2014 14.5  11.5 - 15.5 % Final  . Platelets 12/28/2014 130* 150 - 400 K/uL Final     RADIOGRAPHIC STUDIES: No results found.  ASSESSMENT/PLAN:    Sinusitis C/o URI symptoms which include nasal and congestion, sinus drainage, occasional sore throat.  She denies any productive cough.  She denies any chest pain, chest pressure, shortness breath or pain with inspiration.  She denies any recent fevers or chills.  Exam reveals some mild nasal congestion and tenderness to both her frontal and maxillary sinuses.  Posterior oropharynx clear with no erythema or exudate.    Cancer of ascending colon s/p right colectomy 08/11/2013 Patient is status post FOLFOX chemotherapy last received greater than one year ago.  Patient has recently been found to have right ovarian metastasis; and is recovering from a recent nephrectomy per Dr. Denman George.  Patient is scheduled to return on 02/14/2015 for labs and a follow-up visit.  Patient stated understanding of all instructions; and was in agreement with this plan of care. The patient knows to call the clinic with any problems, questions or concerns.   Review/collaboration with Dr. Benay Spice regarding all aspects of patient's visit today.   Total time spent with patient was 25  minutes;  with greater than 75 percent of that time spent in face to face counseling regarding patient's symptoms,  and coordination of care and follow up.  Disclaimer:This dictation was prepared with Dragon/digital dictation along with [employer]. Any transcriptional errors that result from this process are unintentional.  Drue Second, NP 01/29/2015

## 2015-01-29 NOTE — Assessment & Plan Note (Signed)
C/o URI symptoms which include nasal and congestion, sinus drainage, occasional sore throat.  She denies any productive cough.  She denies any chest pain, chest pressure, shortness breath or pain with inspiration.  She denies any recent fevers or chills.  Exam reveals some mild nasal congestion and tenderness to both her frontal and maxillary sinuses.  Posterior oropharynx clear with no erythema or exudate.

## 2015-01-29 NOTE — Assessment & Plan Note (Signed)
Patient is status post FOLFOX chemotherapy last received greater than one year ago.  Patient has recently been found to have right ovarian metastasis; and is recovering from a recent nephrectomy per Dr. Denman George.  Patient is scheduled to return on 02/14/2015 for labs and a follow-up visit.

## 2015-02-01 ENCOUNTER — Telehealth: Payer: Self-pay | Admitting: *Deleted

## 2015-02-01 NOTE — Telephone Encounter (Signed)
TC to pt to check status following [facility] visit 12/22. Pt reports decrease cough and congestion. She states she is feeling better. Pt understands return to clinic precautions.

## 2015-02-08 ENCOUNTER — Other Ambulatory Visit: Payer: Self-pay | Admitting: Neurology

## 2015-02-14 ENCOUNTER — Ambulatory Visit (HOSPITAL_BASED_OUTPATIENT_CLINIC_OR_DEPARTMENT_OTHER): Payer: Self-pay | Admitting: Oncology

## 2015-02-14 ENCOUNTER — Other Ambulatory Visit (HOSPITAL_BASED_OUTPATIENT_CLINIC_OR_DEPARTMENT_OTHER): Payer: Self-pay

## 2015-02-14 ENCOUNTER — Encounter: Payer: Self-pay | Admitting: Oncology

## 2015-02-14 VITALS — BP 126/79 | HR 78 | Temp 98.6°F | Resp 18 | Ht 66.5 in | Wt 171.8 lb

## 2015-02-14 DIAGNOSIS — R222 Localized swelling, mass and lump, trunk: Secondary | ICD-10-CM

## 2015-02-14 DIAGNOSIS — C182 Malignant neoplasm of ascending colon: Secondary | ICD-10-CM

## 2015-02-14 NOTE — Progress Notes (Signed)
[name] OFFICE PROGRESS NOTE   Diagnosis: Colon cancer  INTERVAL HISTORY:   [name] returns as scheduled. Her only concern is of a "knot" in her abdomen near her incision. Has been present since around the time of her surgery. Has not changed in size. Tender to touch at times. Denies nausea, vomiting, and bleeding. No change in her bowels.   Objective:  Vital signs in last 24 hours:  Blood pressure 126/79, pulse 78, temperature 98.6 F (37 C), temperature source Oral, resp. rate 18, height 5' 6.5" (1.689 m), weight 171 lb 12.8 oz (77.928 kg), SpO2 100 %.    HEENT: Neck without mass Lymphatics: No cervical, supra-clavicular, axillary, or inguinal nodes Resp: Lungs clear bilaterally Cardio: Regular rate and rhythm GI: No hepatomegaly, no mass, healing midline incision. 2 cm X 1 cm linear hardened area approximately 1 cm to the right of her incision.  Vascular: No leg edema   Lab Results:  Lab Results  Component Value Date   WBC 6.4 12/29/2014   HGB 10.9* 12/29/2014   HCT 32.8* 12/29/2014   MCV 82.2 12/29/2014   PLT 131* 12/29/2014   NEUTROABS 3.1 12/20/2014      Lab Results  Component Value Date   CEA 376.8* 11/25/2014     Medications: I have reviewed the patient's current medications.  Assessment: 1. 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  The tumor returned microsatellite stable with no loss of mismatch repair protein expression   APC mutated. No BRAF, KRAS, or NRAS mutation On Foundation 1 testing   Cycle 1 adjuvant FOLFOX 09/08/2013   Cycle 2 adjuvant FOLFOX 09/24/2013   Cycle 3 adjuvant FOLFOX 10/08/2013.   Cycle 4 adjuvant FOLFOX 10/22/2013.   Cycle 5 adjuvant FOLFOX 11/05/2013. Oxaliplatin held due to thrombocytopenia.  Cycle 6 FOLFOX 11/19/2013.  Cycle 7 FOLFOX 12/03/2013. Oxaliplatin held secondary to  thrombocytopenia.  Cycle 8 FOLFOX 12/17/2013.  Cycle 9 FOLFOX 01/04/2014. Oxaliplatin held secondary to neutropenia.   Cycle 10 FOLFOX 01/21/2014. Oxaliplatin held secondary to thrombocytopenia.  Cycle 11 FOLFOX 02/04/2014  Cycle 12 FOLFOX 02/18/2014, oxaliplatin dose reduced secondary to thrombocytopenia  CT abdomen/pelvis 01/30/2014 revealed splenomegaly and no evidence of recurrent colon cancer  CT chest 04/07/2014 with a stable right lower lobe nodule and no evidence for metastatic disease, no nodules seen on the CT 11/26/2014  Markedly elevated CEA 11/24/2014  CT 11/26/2014 revealed a right pelvic mass, splenomegaly, small volume ascites  Rt. Salpingo-oophorectomy 12/28/2014 with the pathology confirming met. Colon cancer 2. Mild elevation of the CEA beginning January 2016 , normal on 05/19/2014  3. History of iron deficiency anemia  4. seizure disorder  5. history of depression  6. 4 mm right lower lobe nodule on a staging chest CT 09/08/2013 , stable on a CT 04/07/2014 7. Hospitalization 09/24/2013 through 09/26/2013 with fever and abdominal pain.  8. 09/24/2013 urine culture positive for coag negative staph.  9. 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 Splenomegaly noted on a CT scan 01/30/2014, the spleen is not palpable today 12. Left arm stiffness and discomfort at the Port-A-Cath site- low clinical suspicion for a Port-A-Cath related DVT or infection 13. Port-A-Cath removed 06/08/2014   Disposition:  [name] was seen and examined with Dr. Benay Spice. The area in her abdomen is likely scar tissue vs. A seroma vs. A former Lovenox injection site. Recommend continued observation. She will call us  if the area enlarges.   Will follow-up on CEA from today.  The plan is to follow her with observation. She will return for an office visit and CEA in two months.  She met with the  research department today to consider enrollment on the [employer] colorectal tissue bank study.    Mikey Bussing, DNP, AGPCNP-BC, AOCNP  02/14/2015  3:04 PM   This was a shared visit with Krisitn Curcio.  [name] was examined. The SQ nodularity at the Rt. Lower abdomen is likely a resolving hematoma. We will f/u on the CEA from today.  Julieanne Manson, MD

## 2015-02-15 ENCOUNTER — Telehealth: Payer: Self-pay | Admitting: *Deleted

## 2015-02-15 ENCOUNTER — Telehealth: Payer: Self-pay | Admitting: Oncology

## 2015-02-15 LAB — CEA (PARALLEL TESTING): CEA: 5.5 ng/mL — AB (ref 0.0–5.0)

## 2015-02-15 LAB — CEA: CEA: 9.2 ng/mL — ABNORMAL HIGH (ref 0.0–4.7)

## 2015-02-15 NOTE — Telephone Encounter (Signed)
Spoke with pt, CEA results given. She voiced appreciation for call.

## 2015-02-15 NOTE — Telephone Encounter (Signed)
-----   Message from [name], MD sent at 02/15/2015  7:33 AM EST ----- Please call patient, cea much better, now at upper limit of normal, repeat next visit

## 2015-02-15 NOTE — Telephone Encounter (Signed)
Called patient's home #. Talked to patient. Made patient aware of her appt in March 2017.       [facility].

## 2015-03-11 ENCOUNTER — Encounter (HOSPITAL_COMMUNITY): Payer: Self-pay

## 2015-03-11 ENCOUNTER — Ambulatory Visit (HOSPITAL_COMMUNITY)
Admission: RE | Admit: 2015-03-11 | Discharge: 2015-03-11 | Disposition: A | Discharge status: Home / Self Care | Payer: Medicaid Other | Source: Ambulatory Visit | Attending: Oncology | Admitting: Oncology

## 2015-03-11 ENCOUNTER — Telehealth: Payer: Self-pay | Admitting: *Deleted

## 2015-03-11 DIAGNOSIS — R188 Other ascites: Secondary | ICD-10-CM | POA: Insufficient documentation

## 2015-03-11 DIAGNOSIS — C182 Malignant neoplasm of ascending colon: Secondary | ICD-10-CM | POA: Insufficient documentation

## 2015-03-11 DIAGNOSIS — Z90710 Acquired absence of both cervix and uterus: Secondary | ICD-10-CM | POA: Insufficient documentation

## 2015-03-11 DIAGNOSIS — R161 Splenomegaly, not elsewhere classified: Secondary | ICD-10-CM | POA: Insufficient documentation

## 2015-03-11 MED ORDER — IOHEXOL 300 MG/ML  SOLN
100.0000 mL | Freq: Once | INTRAMUSCULAR | Status: AC | PRN
  Administered 2015-03-11: 100 mL via INTRAVENOUS

## 2015-03-11 NOTE — Telephone Encounter (Signed)
-----   Message from [name], MD sent at 03/11/2015  3:21 PM EST ----- Please call patient, ct negative for cancer

## 2015-03-11 NOTE — Telephone Encounter (Signed)
Called pt with CT results. Negative for cancer, per Dr. Benay Spice. She voiced understanding.

## 2015-04-04 ENCOUNTER — Emergency Department (HOSPITAL_COMMUNITY)
Admission: EM | Admit: 2015-04-04 | Discharge: 2015-04-04 | Disposition: A | Discharge status: Home / Self Care | Payer: Medicaid Other | Attending: Emergency Medicine | Admitting: Emergency Medicine

## 2015-04-04 ENCOUNTER — Encounter (HOSPITAL_COMMUNITY): Payer: Self-pay

## 2015-04-04 DIAGNOSIS — Z862 Personal history of diseases of the blood and blood-forming organs and certain disorders involving the immune mechanism: Secondary | ICD-10-CM | POA: Insufficient documentation

## 2015-04-04 DIAGNOSIS — Z8679 Personal history of other diseases of the circulatory system: Secondary | ICD-10-CM | POA: Insufficient documentation

## 2015-04-04 DIAGNOSIS — Z793 Long term (current) use of hormonal contraceptives: Secondary | ICD-10-CM | POA: Insufficient documentation

## 2015-04-04 DIAGNOSIS — Z880 Allergy status to penicillin: Secondary | ICD-10-CM | POA: Insufficient documentation

## 2015-04-04 DIAGNOSIS — H9203 Otalgia, bilateral: Secondary | ICD-10-CM | POA: Insufficient documentation

## 2015-04-04 DIAGNOSIS — J209 Acute bronchitis, unspecified: Secondary | ICD-10-CM | POA: Insufficient documentation

## 2015-04-04 DIAGNOSIS — Z8719 Personal history of other diseases of the digestive system: Secondary | ICD-10-CM | POA: Insufficient documentation

## 2015-04-04 DIAGNOSIS — J40 Bronchitis, not specified as acute or chronic: Secondary | ICD-10-CM

## 2015-04-04 DIAGNOSIS — Z8659 Personal history of other mental and behavioral disorders: Secondary | ICD-10-CM | POA: Insufficient documentation

## 2015-04-04 DIAGNOSIS — Z85038 Personal history of other malignant neoplasm of large intestine: Secondary | ICD-10-CM | POA: Insufficient documentation

## 2015-04-04 DIAGNOSIS — J069 Acute upper respiratory infection, unspecified: Secondary | ICD-10-CM

## 2015-04-04 MED ORDER — IPRATROPIUM-ALBUTEROL 0.5-2.5 (3) MG/3ML IN SOLN
3.0000 mL | Freq: Once | RESPIRATORY_TRACT | Status: AC
  Administered 2015-04-04: 3 mL via RESPIRATORY_TRACT
  Filled 2015-04-04: qty 3

## 2015-04-04 MED ORDER — AZITHROMYCIN 250 MG PO TABS: 250.0000 mg | ORAL_TABLET | Freq: Every day | ORAL | Status: DC

## 2015-04-04 MED ORDER — ALBUTEROL SULFATE HFA 108 (90 BASE) MCG/ACT IN AERS
2.0000 | INHALATION_SPRAY | Freq: Once | RESPIRATORY_TRACT | Status: AC
  Administered 2015-04-04: 2 via RESPIRATORY_TRACT
  Filled 2015-04-04: qty 6.7

## 2015-04-04 MED ORDER — GUAIFENESIN-CODEINE 100-10 MG/5ML PO SOLN: 5.0000 mL | Freq: Three times a day (TID) | ORAL | Status: DC | PRN

## 2015-04-04 MED ORDER — PREDNISONE 20 MG PO TABS: 40.0000 mg | ORAL_TABLET | Freq: Every day | ORAL | Status: DC

## 2015-04-04 MED ORDER — PREDNISONE 20 MG PO TABS
60.0000 mg | ORAL_TABLET | Freq: Once | ORAL | Status: AC
  Administered 2015-04-04: 60 mg via ORAL
  Filled 2015-04-04: qty 3

## 2015-04-04 MED ORDER — AEROCHAMBER PLUS FLO-VU MEDIUM MISC
1.0000 | Freq: Once | Status: AC
  Administered 2015-04-04: 1

## 2015-04-04 NOTE — Progress Notes (Signed)
Given [place] resources to include [facility]

## 2015-04-04 NOTE — ED Notes (Signed)
Pt c/o increasing cough, nasal congestion, and bilateral ear pain x 5 days.  Pain score 8/10.  Pt reports taking Mucinex and Nyquil w/o relief.

## 2015-04-04 NOTE — ED Provider Notes (Signed)
CSN: [id number]     Arrival date & time 04/04/15  1007 History  By signing my name below, I, [name], attest that this documentation has been prepared under the direction and in the presence of [name], PA-C. Electronically Signed: Rayna [name], ED Scribe. 04/04/2015. 1:20 PM.    Chief Complaint  Patient presents with  . Nasal Congestion  . Cough  . Otalgia   The history is provided by the patient. No language interpreter was used.    HPI Comments: [name] is a 40 y.o. female who presents to the Emergency Department complaining of a constant, moderate, dry cough onset 5 days ago. Pt reports associated sore throat, HA, central CP when coughing with constant, mild, bilateral rib tenderness, nasal congestion and sinus pressure/pain. She has taken Mucinex and Nyquil with no significant relief. Pt denies a PMHx of asthma, bronchitis, DM, HTN or a hx of smoking. Pt denies fevers, chills, wheezing, SOB, n/v, abd pain or any other associated symptoms at this time.    Past Medical History  Diagnosis Date  . GERD (gastroesophageal reflux disease)   . Seizures ([place])     last seizure 08/2012  . History of migraine     no problems in "a long time"  . Anemia, iron deficiency   . Restless leg syndrome   . History of blood transfusion 08/16/2013  . Difficulty swallowing pills   . Anxiety   . Depression   . Headache     migraines "long time ago"  . ADD (attention deficit disorder)   . Colon cancer ([place]) 08/2013   Past Surgical History  Procedure Laterality Date  . Esophagogastroduodenoscopy  07/07/2013  . Laparoscopic partial colectomy Right 08/11/2013    Procedure: LAPAROSCOPIC RIGHT HEMICOLECTOMY;  Surgeon: Stark Klein, MD;  Location: [facility];  Service: General;  Laterality: Right;  . Colonoscopy with propofol  07/07/2013  . Tubal ligation  11/07/2000  . Endometrial fulguration  11/01/2003  . Laparoscopic lysis of adhesions  11/01/2003  . Cystoscopy  11/01/2003  . Appendectomy   08/24/2004  . Abdominal hysterectomy  08/24/2004    partial  . Unilateral salpingectomy Left 08/24/2004  . Left oophorectomy  08/24/2004  . Panniculectomy  08/24/2004  . Portacath placement Left 09/09/2013    Procedure: INSERTION PORT-A-CATH;  Surgeon: Stark Klein, MD;  Location: [facility];  Service: General;  Laterality: Left;  . Colon surgery  08/11/2013    removed a foot of colon  . Port-a-cath removal Left 06/08/2014    Procedure: REMOVAL PORT-A-CATH;  Surgeon: Stark Klein, MD;  Location: [facility];  Service: General;  Laterality: Left;  . Colonoscopy 10-18-14    . Laparotomy N/A 12/28/2014    Procedure: EXPLORATORY LAPAROTOMY;  Surgeon: Everitt Amber, MD;  Location: [facility];  Service: Gynecology;  Laterality: N/A;  . Salpingoophorectomy Right 12/28/2014    Procedure: RIGHT SALPINGO OOPHORECTOMY;  Surgeon: Everitt Amber, MD;  Location: [facility];  Service: Gynecology;  Laterality: Right;   Family History  Problem Relation Age of Onset  . Heart attack Mother   . Colon polyps Mother   . Colitis Mother   . Diabetes Father   . Heart attack Father   . Stroke Father   . Cirrhosis Maternal Aunt   . Cancer Maternal Uncle     unknown type  . Colon cancer Neg Hx   . Esophageal cancer Neg Hx   . Rectal cancer Neg Hx   . Stomach cancer Neg Hx  Social History  Substance Use Topics  . Smoking status: Never Smoker   . Smokeless tobacco: Never Used  . Alcohol Use: No   OB History    No data available     Review of Systems  All other systems reviewed and are negative.   Allergies  Adhesive; Eggs or egg-derived products; Metoclopramide hcl; Oxycodone-acetaminophen; Sulfa antibiotics; and Penicillins  Home Medications   Prior to Admission medications   Medication Sig Start Date End Date Taking? Authorizing Provider  azithromycin (ZITHROMAX) 250 MG tablet Take 1 tablet (250 mg total) by mouth daily. Take first 2 tablets together, then 1 every day until finished. 04/04/15   [name], PA-C  estradiol (ESTRACE) 0.5 MG tablet Take 1 tablet (0.5 mg total) by mouth daily. 01/13/15   Everitt Amber, MD  guaiFENesin-codeine 100-10 MG/5ML syrup Take 5 mLs by mouth 3 (three) times daily as needed for cough. 04/04/15   [name], PA-C  HYDROcodone-acetaminophen (NORCO/VICODIN) 5-325 MG per tablet Take 1-2 tablets by mouth every 4 (four) hours as needed for moderate pain or severe pain. 06/08/14   Stark Klein, MD  HYDROmorphone (DILAUDID) 2 MG tablet Take 1 tablet (2 mg total) by mouth every 4 (four) hours as needed for severe pain. 12/30/14   Everitt Amber, MD  LORazepam (ATIVAN) 1 MG tablet Take 1 tablet (1 mg total) by mouth 2 (two) times daily as needed for anxiety or sleep. 01/21/14   Owens Shark, NP  ondansetron (ZOFRAN) 8 MG tablet TAKE ONE TABLET BY MOUTH EVERY 8 HOURS AS NEEDED FOR NAUSEA OR VOMITING. 08/16/14   [name], MD  predniSONE (DELTASONE) 20 MG tablet Take 2 tablets (40 mg total) by mouth daily. 04/04/15   [name], PA-C   BP 149/80 mmHg  Pulse 89  Temp(Src) 98.2 F (36.8 C) (Oral)  Resp 14  SpO2 100%    Physical Exam  Constitutional: She is oriented to person, place, and time. She appears well-developed and well-nourished. No distress.  Non-toxic appearing, appears uncomfortable, NAD  HENT:  Head: Normocephalic and atraumatic.  Right Ear: Tympanic membrane, external ear and ear canal normal.  Left Ear: Tympanic membrane, external ear and ear canal normal.  Nose: Rhinorrhea present.  Mouth/Throat: Oropharynx is clear and moist. No oropharyngeal exudate.  Eyes: Conjunctivae and EOM are normal. Pupils are equal, round, and reactive to light. Right eye exhibits no discharge. Left eye exhibits no discharge. No scleral icterus.  Neck: Normal range of motion. Neck supple. No JVD present. No tracheal deviation present. No thyromegaly present.  Cardiovascular: Normal rate, regular rhythm, normal heart sounds and intact distal pulses.  Exam reveals no gallop  and no friction rub.   No murmur heard. Pulmonary/Chest: Effort normal and breath sounds normal. No respiratory distress. She has no wheezes. She has no rales. She exhibits tenderness (TTP of sternum and lateral ribs).  Abdominal: Soft. Bowel sounds are normal. She exhibits no distension and no mass. There is no tenderness. There is no rebound and no guarding.  Musculoskeletal: Normal range of motion. She exhibits no edema or tenderness.  Lymphadenopathy:    She has no cervical adenopathy.  Neurological: She is alert and oriented to person, place, and time. She has normal reflexes. No cranial nerve deficit. She exhibits normal muscle tone. Coordination normal.  Skin: Skin is warm and dry. No rash noted. She is not diaphoretic. No erythema. No pallor.  Psychiatric: She has a normal mood and affect. Her behavior is normal. Judgment  and thought content normal.  Nursing note and vitals reviewed.   ED Course  Procedures  DIAGNOSTIC STUDIES: Oxygen Saturation is 100% on RA, normal by my interpretation.    COORDINATION OF CARE: 12:56 PM Discussed next steps with pt. She verbalized understanding and is agreeable with the plan.   Labs Review Labs Reviewed - No data to display  Imaging Review No results found.   EKG Interpretation None      MDM   Pt with URI sx x 5 days, non-productive cough, worse at night or when supine, burning discomfort in her chest - described as "tightness" and rib pain with coughing, she denies fever, sweats, chills, SOB.  Appears consistent with viral URI.  Will d/c with prednisone, albuterol, cough syrup.  Encouraged tylenol and ibuprofen for pain and fever.  Pt does not have a PCP.  I have provided a Zpak prescription for the pt to initiate in 2-3 days if she does not feel like she is improving, or with onset of fever, sweats, productive sputum.  Return precautions were reviewed at length.  Pt agrees to plan.   She was discharged in good and stable condition,  VSS: Filed Vitals:   04/04/15 1035 04/04/15 1352  BP: 149/80   Pulse: 79 89  Temp: 98.2 F (36.8 C)   Resp: 14    Meds given in ER: Medications  ipratropium-albuterol (DUONEB) 0.5-2.5 (3) MG/3ML nebulizer solution 3 mL (3 mLs Nebulization Given 04/04/15 1312)  predniSONE (DELTASONE) tablet 60 mg (60 mg Oral Given 04/04/15 1312)  albuterol (PROVENTIL HFA;VENTOLIN HFA) 108 (90 Base) MCG/ACT inhaler 2 puff (2 puffs Inhalation Given 04/04/15 1347)  AEROCHAMBER PLUS FLO-VU MEDIUM MISC 1 each (1 each Other Given 04/04/15 1350)      Final diagnoses:  URI (upper respiratory infection)  Bronchitis   I personally performed the services described in this documentation, which was scribed in my presence. The recorded information has been reviewed and is accurate.      [name], PA-C 04/04/15 1637  Nat Christen, MD 04/05/15 [phone]

## 2015-04-04 NOTE — Discharge Instructions (Signed)
Acute Bronchitis  Bronchitis is when the airways that extend from the windpipe into the lungs get red, puffy, and painful (inflamed). Bronchitis often causes thick spit (mucus) to develop. This leads to a cough. A cough is the most common symptom of bronchitis. In acute bronchitis, the condition usually begins suddenly and goes away over time (usually in 2 weeks). Smoking, allergies, and asthma can make bronchitis worse. Repeated episodes of bronchitis may cause more lung problems.  HOME CARE  Rest.  Drink enough fluids to keep your pee (urine) clear or pale yellow (unless you need to limit fluids as told by your doctor).  Only take over-the-counter or prescription medicines as told by your doctor.  Avoid smoking and secondhand smoke. These can make bronchitis worse. If you are a smoker, think about using nicotine gum or skin patches. Quitting smoking will help your lungs heal faster.  Reduce the chance of getting bronchitis again by:  Washing your hands often.  Avoiding people with cold symptoms.  Trying not to touch your hands to your mouth, nose, or eyes.  Follow up with your doctor as told. GET HELP IF: Your symptoms do not improve after 1 week of treatment. Symptoms include:  Cough.  Fever.  Coughing up thick spit.  Body aches.  Chest congestion.  Chills.  Shortness of breath.  Sore throat. GET HELP RIGHT AWAY IF:   You have an increased fever.  You have chills.  You have severe shortness of breath.  You have bloody thick spit (sputum).  You throw up (vomit) often.  You lose too much body fluid (dehydration).  You have a severe headache.  You faint. MAKE SURE YOU:   Understand these instructions.  Will watch your condition.  Will get help right away if you are not doing well or get worse.   This information is not intended to replace advice given to you by your health care provider. Make sure you discuss any questions you have with your health  care provider.   Document Released: 07/11/2007 Document Revised: 09/24/2012 Document Reviewed: 07/15/2012 Elsevier Interactive Patient Education 2016 [employer].   How to Use an Inhaler Proper inhaler technique is very important. Good technique ensures that the medicine reaches the lungs. Poor technique results in depositing the medicine on the tongue and back of the throat rather than in the airways. If you do not use the inhaler with good technique, the medicine will not help you. STEPS TO FOLLOW IF USING AN INHALER WITHOUT AN EXTENSION TUBE  Remove the cap from the inhaler.  If you are using the inhaler for the first time, you will need to prime it. Shake the inhaler for 5 seconds and release four puffs into the air, away from your face. Ask your health care provider or pharmacist if you have questions about priming your inhaler.  Shake the inhaler for 5 seconds before each breath in (inhalation).  Position the inhaler so that the top of the canister faces up.  Put your index finger on the top of the medicine canister. Your thumb supports the bottom of the inhaler.  Open your mouth.  Either place the inhaler between your teeth and place your lips tightly around the mouthpiece, or hold the inhaler 1-2 inches away from your open mouth. If you are unsure of which technique to use, ask your health care provider.  Breathe out (exhale) normally and as completely as possible.  Press the canister down with your index finger to release the medicine.  At the same time as the canister is pressed, inhale deeply and slowly until your lungs are completely filled. This should take 4-6 seconds. Keep your tongue down.  Hold the medicine in your lungs for 5-10 seconds (10 seconds is best). This helps the medicine get into the small airways of your lungs.  Breathe out slowly, through pursed lips. Whistling is an example of pursed lips.  Wait at least 15-30 seconds between puffs. Continue with  the above steps until you have taken the number of puffs your health care provider has ordered. Do not use the inhaler more than your health care provider tells you.  Replace the cap on the inhaler.  Follow the directions from your health care provider or the inhaler insert for cleaning the inhaler. STEPS TO FOLLOW IF USING AN INHALER WITH AN EXTENSION (SPACER)  Remove the cap from the inhaler.  If you are using the inhaler for the first time, you will need to prime it. Shake the inhaler for 5 seconds and release four puffs into the air, away from your face. Ask your health care provider or pharmacist if you have questions about priming your inhaler.  Shake the inhaler for 5 seconds before each breath in (inhalation).  Place the open end of the spacer onto the mouthpiece of the inhaler.  Position the inhaler so that the top of the canister faces up and the spacer mouthpiece faces you.  Put your index finger on the top of the medicine canister. Your thumb supports the bottom of the inhaler and the spacer.  Breathe out (exhale) normally and as completely as possible.  Immediately after exhaling, place the spacer between your teeth and into your mouth. Close your lips tightly around the spacer.  Press the canister down with your index finger to release the medicine.  At the same time as the canister is pressed, inhale deeply and slowly until your lungs are completely filled. This should take 4-6 seconds. Keep your tongue down and out of the way.  Hold the medicine in your lungs for 5-10 seconds (10 seconds is best). This helps the medicine get into the small airways of your lungs. Exhale.  Repeat inhaling deeply through the spacer mouthpiece. Again hold that breath for up to 10 seconds (10 seconds is best). Exhale slowly. If it is difficult to take this second deep breath through the spacer, breathe normally several times through the spacer. Remove the spacer from your mouth.  Wait at  least 15-30 seconds between puffs. Continue with the above steps until you have taken the number of puffs your health care provider has ordered. Do not use the inhaler more than your health care provider tells you.  Remove the spacer from the inhaler, and place the cap on the inhaler.  Follow the directions from your health care provider or the inhaler insert for cleaning the inhaler and spacer. If you are using different kinds of inhalers, use your quick relief medicine to open the airways 10-15 minutes before using a steroid if instructed to do so by your health care provider. If you are unsure which inhalers to use and the order of using them, ask your health care provider, nurse, or respiratory therapist. If you are using a steroid inhaler, always rinse your mouth with water after your last puff, then gargle and spit out the water. Do not swallow the water. AVOID:  Inhaling before or after starting the spray of medicine. It takes practice to coordinate your breathing with  triggering the spray.  Inhaling through the nose (rather than the mouth) when triggering the spray. HOW TO DETERMINE IF YOUR INHALER IS FULL OR NEARLY EMPTY You cannot know when an inhaler is empty by shaking it. A few inhalers are now being made with dose counters. Ask your health care provider for a prescription that has a dose counter if you feel you need that extra help. If your inhaler does not have a counter, ask your health care provider to help you determine the date you need to refill your inhaler. Write the refill date on a calendar or your inhaler canister. Refill your inhaler 7-10 days before it runs out. Be sure to keep an adequate supply of medicine. This includes making sure it is not expired, and that you have a spare inhaler.  SEEK MEDICAL CARE IF:   Your symptoms are only partially relieved with your inhaler.  You are having trouble using your inhaler.  You have some increase in phlegm. SEEK IMMEDIATE  MEDICAL CARE IF:   You feel little or no relief with your inhalers. You are still wheezing and are feeling shortness of breath or tightness in your chest or both.  You have dizziness, headaches, or a fast heart rate.  You have chills, fever, or night sweats.  You have a noticeable increase in phlegm production, or there is blood in the phlegm. MAKE SURE YOU:   Understand these instructions.  Will watch your condition.  Will get help right away if you are not doing well or get worse.   This information is not intended to replace advice given to you by your health care provider. Make sure you discuss any questions you have with your health care provider.   Document Released: 01/20/2000 Document Revised: 11/12/2012 Document Reviewed: 08/21/2012 Elsevier Interactive Patient Education 2016 Elsevier Inc.  Upper Respiratory Infection, Adult Most upper respiratory infections (URIs) are a viral infection of the air passages leading to the lungs. A URI affects the nose, throat, and upper air passages. The most common type of URI is nasopharyngitis and is typically referred to as "the common cold." URIs run their course and usually go away on their own. Most of the time, a URI does not require medical attention, but sometimes a bacterial infection in the upper airways can follow a viral infection. This is called a secondary infection. Sinus and middle ear infections are common types of secondary upper respiratory infections. Bacterial pneumonia can also complicate a URI. A URI can worsen asthma and chronic obstructive pulmonary disease (COPD). Sometimes, these complications can require emergency medical care and may be life threatening.  CAUSES Almost all URIs are caused by viruses. A virus is a type of germ and can spread from one person to another.  RISKS FACTORS You may be at risk for a URI if:   You smoke.   You have chronic heart or lung disease.  You have a weakened defense (immune)  system.   You are very young or very old.   You have nasal allergies or asthma.  You work in crowded or poorly ventilated areas.  You work in health care facilities or schools. SIGNS AND SYMPTOMS  Symptoms typically develop 2-3 days after you come in contact with a cold virus. Most viral URIs last 7-10 days. However, viral URIs from the influenza virus (flu virus) can last 14-18 days and are typically more severe. Symptoms may include:   Runny or stuffy (congested) nose.   Sneezing.   Cough.  Sore throat.   Headache.   Fatigue.   Fever.   Loss of appetite.   Pain in your forehead, behind your eyes, and over your cheekbones (sinus pain).  Muscle aches.  DIAGNOSIS  Your health care provider may diagnose a URI by:  Physical exam.  Tests to check that your symptoms are not due to another condition such as:  Strep throat.  Sinusitis.  Pneumonia.  Asthma. TREATMENT  A URI goes away on its own with time. It cannot be cured with medicines, but medicines may be prescribed or recommended to relieve symptoms. Medicines may help:  Reduce your fever.  Reduce your cough.  Relieve nasal congestion. HOME CARE INSTRUCTIONS   Take medicines only as directed by your health care provider.   Gargle warm saltwater or take cough drops to comfort your throat as directed by your health care provider.  Use a warm mist humidifier or inhale steam from a shower to increase air moisture. This may make it easier to breathe.  Drink enough fluid to keep your urine clear or pale yellow.   Eat soups and other clear broths and maintain good nutrition.   Rest as needed.   Return to work when your temperature has returned to normal or as your health care provider advises. You may need to stay home longer to avoid infecting others. You can also use a face mask and careful hand washing to prevent spread of the virus.  Increase the usage of your inhaler if you have asthma.    Do not use any tobacco products, including cigarettes, chewing tobacco, or electronic cigarettes. If you need help quitting, ask your health care provider. PREVENTION  The best way to protect yourself from getting a cold is to practice good hygiene.   Avoid oral or hand contact with people with cold symptoms.   Wash your hands often if contact occurs.  There is no clear evidence that vitamin C, vitamin E, echinacea, or exercise reduces the chance of developing a cold. However, it is always recommended to get plenty of rest, exercise, and practice good nutrition.  SEEK MEDICAL CARE IF:   You are getting worse rather than better.   Your symptoms are not controlled by medicine.   You have chills.  You have worsening shortness of breath.  You have [place] or red mucus.  You have yellow or [place] nasal discharge.  You have pain in your face, especially when you bend forward.  You have a fever.  You have swollen neck glands.  You have pain while swallowing.  You have white areas in the back of your throat. SEEK IMMEDIATE MEDICAL CARE IF:   You have severe or persistent:  Headache.  Ear pain.  Sinus pain.  Chest pain.  You have chronic lung disease and any of the following:  Wheezing.  Prolonged cough.  Coughing up blood.  A change in your usual mucus.  You have a stiff neck.  You have changes in your:  Vision.  Hearing.  Thinking.  Mood. MAKE SURE YOU:   Understand these instructions.  Will watch your condition.  Will get help right away if you are not doing well or get worse.   This information is not intended to replace advice given to you by your health care provider. Make sure you discuss any questions you have with your health care provider.   Document Released: 07/18/2000 Document Revised: 06/08/2014 Document Reviewed: 04/29/2013 Elsevier Interactive Patient Education [employer].

## 2015-04-04 NOTE — Progress Notes (Signed)
Pt is aware of what her family planning medicaid pays for and Cm discussed she could also seek assist from her oncology SW

## 2015-04-04 NOTE — Progress Notes (Signed)
CM spoke with pt who confirms uninsured from [place] but now staying in [place] with her mother for "two weeks now. Address is still [place]" with no pcp.  CM discussed and provided written information for uninsured accepting pcps, discussed the importance of pcp vs EDP services for f/u care, www.needymeds.org, www.goodrx.com, discounted pharmacies and other [employer] such as [employer], affordable care act, financial assistance, uninsured dental services, [facility] med assist, DSS and  health department  Reviewed resources for [employer] uninsured accepting pcps like [facility], family medicine at [employer], [facility], [facility], local urgent care centers, [facility], [facility] family practice, general medical clinics, family services of the [place], [facility] urgent care plus others, medication resources, [facility] out patient pharmacies and housing Pt voiced understanding and appreciation of resources provided   Provided [facility] contact information not eligible been in [place] only 2 weeks Encouraged to [employer] care act States seh was told she did not qualify because she had to make $1100 - no qualify for medicaid because her child "decided to live with her father"

## 2015-04-14 ENCOUNTER — Telehealth: Payer: Self-pay | Admitting: Oncology

## 2015-04-14 ENCOUNTER — Other Ambulatory Visit: Payer: Self-pay

## 2015-04-14 ENCOUNTER — Ambulatory Visit: Payer: Self-pay | Admitting: Oncology

## 2015-04-14 NOTE — Telephone Encounter (Signed)
returned call to cx appt and lvm to call back to r/s

## 2015-04-22 ENCOUNTER — Telehealth: Payer: Self-pay | Admitting: Oncology

## 2015-04-22 ENCOUNTER — Other Ambulatory Visit: Payer: Self-pay | Admitting: *Deleted

## 2015-04-22 NOTE — Telephone Encounter (Signed)
lvm for pt regarding to may appt.... °

## 2015-06-29 ENCOUNTER — Other Ambulatory Visit: Payer: Self-pay | Admitting: *Deleted

## 2015-06-29 DIAGNOSIS — C182 Malignant neoplasm of ascending colon: Secondary | ICD-10-CM

## 2015-06-30 ENCOUNTER — Other Ambulatory Visit: Payer: Self-pay | Admitting: *Deleted

## 2015-06-30 ENCOUNTER — Ambulatory Visit (HOSPITAL_BASED_OUTPATIENT_CLINIC_OR_DEPARTMENT_OTHER): Payer: Self-pay | Admitting: Oncology

## 2015-06-30 ENCOUNTER — Other Ambulatory Visit (HOSPITAL_BASED_OUTPATIENT_CLINIC_OR_DEPARTMENT_OTHER): Payer: Self-pay

## 2015-06-30 ENCOUNTER — Telehealth: Payer: Self-pay | Admitting: Oncology

## 2015-06-30 VITALS — BP 124/80 | HR 86 | Temp 98.9°F | Resp 20 | Ht 66.5 in | Wt 176.8 lb

## 2015-06-30 DIAGNOSIS — C182 Malignant neoplasm of ascending colon: Secondary | ICD-10-CM

## 2015-06-30 DIAGNOSIS — D696 Thrombocytopenia, unspecified: Secondary | ICD-10-CM

## 2015-06-30 DIAGNOSIS — G620 Drug-induced polyneuropathy: Secondary | ICD-10-CM

## 2015-06-30 MED ORDER — LORAZEPAM 1 MG PO TABS: 1.0000 mg | ORAL_TABLET | Freq: Two times a day (BID) | ORAL | Status: DC | PRN

## 2015-06-30 NOTE — Progress Notes (Signed)
  [facility] OFFICE PROGRESS NOTE   Diagnosis: Colon cancer  INTERVAL HISTORY:   [name] returns as scheduled. She feels well. Good appetite and energy level. No pain. She is working. No complaint.  Objective:  Vital signs in last 24 hours:  Blood pressure 124/80, pulse 86, temperature 98.9 F (37.2 C), temperature source Oral, resp. rate 20, height 5' 6.5" (1.689 m), weight 176 lb 12.8 oz (80.196 kg), SpO2 100 %.    HEENT: Neck without mass Lymphatics: No cervical, supraclavicular, axillary, or inguinal nodes Resp: Lungs clear bilaterally Cardio: Regular rate and rhythm GI: No hepatomegaly, no mass, mild tenderness in the right lower quadrant Vascular: No leg edema   Lab Results:  Lab Results  Component Value Date   WBC 6.4 12/29/2014   HGB 10.9* 12/29/2014   HCT 32.8* 12/29/2014   MCV 82.2 12/29/2014   PLT 131* 12/29/2014   NEUTROABS 3.1 12/20/2014    Lab Results  Component Value Date   CEA1 9.2* 02/14/2015     Medications: I have reviewed the patient's current medications.  Assessment/Plan: 1. 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  The tumor returned microsatellite stable with no loss of mismatch repair protein expression   APC mutated. No BRAF, KRAS, or NRAS mutation On Foundation 1 testing   Cycle 1 adjuvant FOLFOX 09/08/2013   Cycle 2 adjuvant FOLFOX 09/24/2013   Cycle 3 adjuvant FOLFOX 10/08/2013.   Cycle 4 adjuvant FOLFOX 10/22/2013.   Cycle 5 adjuvant FOLFOX 11/05/2013. Oxaliplatin held due to thrombocytopenia.  Cycle 6 FOLFOX 11/19/2013.  Cycle 7 FOLFOX 12/03/2013. Oxaliplatin held secondary to thrombocytopenia.  Cycle 8 FOLFOX 12/17/2013.  Cycle 9 FOLFOX 01/04/2014. Oxaliplatin held secondary to neutropenia.   Cycle 10 FOLFOX 01/21/2014. Oxaliplatin held secondary to thrombocytopenia.  Cycle 11 FOLFOX  02/04/2014  Cycle 12 FOLFOX 02/18/2014, oxaliplatin dose reduced secondary to thrombocytopenia  CT abdomen/pelvis 01/30/2014 revealed splenomegaly and no evidence of recurrent colon cancer  CT chest 04/07/2014 with a stable right lower lobe nodule and no evidence for metastatic disease, no nodules seen on the CT 11/26/2014  Markedly elevated CEA 11/24/2014  CT 11/26/2014 revealed a right pelvic mass, splenomegaly, small volume ascites  Right salpingo-oophorectomy 12/28/2014 with the pathology confirming metastatic colon cancer 2. Mild elevation of the CEA beginning January 2016 , normal on 05/19/2014  3. History of iron deficiency anemia  4. seizure disorder  5. history of depression  6. 4 mm right lower lobe nodule on a staging chest CT 09/08/2013 , stable on a CT 04/07/2014 7. Hospitalization 09/24/2013 through 09/26/2013 with fever and abdominal pain.  8. 09/24/2013 urine culture positive for coag negative staph.  9. 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 Splenomegaly noted on a CT scan 01/30/2014, the spleen is not palpable today    Disposition:  She appears stable. No clinical evidence of progressive colon cancer. We will follow-up on the CEA from today. The plan is to obtain a restaging CT evaluation for a progressive rise in the CEA.  [name] will return for an office visit and CEA in 3 months. She will contact us in the interim as needed.  Betsy Coder, MD  06/30/2015  4:40 PM

## 2015-06-30 NOTE — Telephone Encounter (Signed)
Gave pt apt & avs °

## 2015-07-01 ENCOUNTER — Telehealth: Payer: Self-pay | Admitting: *Deleted

## 2015-07-01 ENCOUNTER — Telehealth: Payer: Self-pay | Admitting: Oncology

## 2015-07-01 DIAGNOSIS — C182 Malignant neoplasm of ascending colon: Secondary | ICD-10-CM

## 2015-07-01 LAB — CEA: CEA: 0.6 ng/mL (ref 0.0–4.7)

## 2015-07-01 LAB — CEA (PARALLEL TESTING): CEA: <0.5 ng/mL

## 2015-07-01 NOTE — Telephone Encounter (Signed)
left msg confirming 7/5 apt  °

## 2015-07-01 NOTE — Telephone Encounter (Signed)
Return call received from patient.  Per Dr. Benay Spice, patient notified that CEA is normal.  Patient is interested in genetic counseling.  Will place referral and POF.

## 2015-07-01 NOTE — Telephone Encounter (Signed)
-----   Message from [name], MD sent at 07/01/2015  7:10 AM EDT ----- Please call patient, cea is normal

## 2015-08-08 ENCOUNTER — Telehealth: Payer: Self-pay | Admitting: Oncology

## 2015-08-08 NOTE — Telephone Encounter (Signed)
returned call and s.w. pt and r/s appt ....pt ok and aware of new d.t °

## 2015-08-10 ENCOUNTER — Encounter: Payer: Self-pay | Admitting: Genetic Counselor

## 2015-09-08 ENCOUNTER — Other Ambulatory Visit: Payer: Self-pay

## 2015-09-08 ENCOUNTER — Encounter: Payer: Self-pay | Admitting: Genetic Counselor

## 2015-09-27 ENCOUNTER — Ambulatory Visit (HOSPITAL_BASED_OUTPATIENT_CLINIC_OR_DEPARTMENT_OTHER): Payer: Self-pay | Admitting: Oncology

## 2015-09-27 ENCOUNTER — Other Ambulatory Visit (HOSPITAL_BASED_OUTPATIENT_CLINIC_OR_DEPARTMENT_OTHER): Payer: Self-pay

## 2015-09-27 VITALS — BP 122/87 | HR 70 | Temp 98.6°F | Resp 18 | Ht 68.5 in | Wt 175.7 lb

## 2015-09-27 DIAGNOSIS — D6959 Other secondary thrombocytopenia: Secondary | ICD-10-CM

## 2015-09-27 DIAGNOSIS — G620 Drug-induced polyneuropathy: Secondary | ICD-10-CM

## 2015-09-27 DIAGNOSIS — C182 Malignant neoplasm of ascending colon: Secondary | ICD-10-CM

## 2015-09-27 NOTE — Progress Notes (Signed)
  [facility] OFFICE PROGRESS NOTE   Diagnosis: Colon cancer  INTERVAL HISTORY:   [name] returns as scheduled. She feels well. Good appetite. No complaint.  Objective:  Vital signs in last 24 hours:  Blood pressure 122/87, pulse 70, temperature 98.6 F (37 C), temperature source Oral, resp. rate 18, height 5' 8.5" (1.74 m), weight 175 lb 11.2 oz (79.7 kg), SpO2 100 %.    HEENT: Neck without mass Lymphatics: No cervical, supraclavicular, axillary, or inguinal nodes Resp: Lungs clear bilaterally Cardio: Regular rate and rhythm GI: No hepatosplenomegaly, no mass, nontender Vascular: No leg edema    Lab Results  Component Value Date   CEA1 0.6 06/30/2015     Medications: I have reviewed the patient's current medications.  Assessment/Plan: 1. 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  The tumor returned microsatellite stable with no loss of mismatch repair protein expression   APC mutated. No BRAF, KRAS, or NRAS mutation On Foundation 1 testing   Cycle 1 adjuvant FOLFOX 09/08/2013   Cycle 2 adjuvant FOLFOX 09/24/2013   Cycle 3 adjuvant FOLFOX 10/08/2013.   Cycle 4 adjuvant FOLFOX 10/22/2013.   Cycle 5 adjuvant FOLFOX 11/05/2013. Oxaliplatin held due to thrombocytopenia.  Cycle 6 FOLFOX 11/19/2013.  Cycle 7 FOLFOX 12/03/2013. Oxaliplatin held secondary to thrombocytopenia.  Cycle 8 FOLFOX 12/17/2013.  Cycle 9 FOLFOX 01/04/2014. Oxaliplatin held secondary to neutropenia.   Cycle 10 FOLFOX 01/21/2014. Oxaliplatin held secondary to thrombocytopenia.  Cycle 11 FOLFOX 02/04/2014  Cycle 12 FOLFOX 02/18/2014, oxaliplatin dose reduced secondary to thrombocytopenia  CT abdomen/pelvis 01/30/2014 revealed splenomegaly and no evidence of recurrent colon cancer  CT chest 04/07/2014 with a stable right lower lobe nodule and no evidence for  metastatic disease, no nodules seen on the CT 11/26/2014  Markedly elevated CEA 11/24/2014  CT 11/26/2014 revealed a right pelvic mass, splenomegaly, small volume ascites  Right salpingo-oophorectomy 12/28/2014 with the pathology confirming metastatic colon cancer 2. Mild elevation of the CEA beginning January 2016 , normal on 05/19/2014  3. History of iron deficiency anemia  4. seizure disorder  5. history of depression  6. 4 mm right lower lobe nodule on a staging chest CT 09/08/2013 , stable on a CT 04/07/2014 7. Hospitalization 09/24/2013 through 09/26/2013 with fever and abdominal pain.  8. 09/24/2013 urine culture positive for coag negative staph.  9. 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 Splenomegaly noted on a CT scan 01/30/2014, the spleen is not palpable today    Disposition:  She appears stable. No clinical evidence of disease progression. We will follow-up on the CEA from today. She will return for an office visit in 3 months.  Betsy Coder, MD  09/27/2015  4:29 PM

## 2015-09-28 LAB — CEA (IN HOUSE-CHCC): CEA (CHCC-In House): <1 ng/mL (ref 0.00–5.00)

## 2015-09-28 LAB — CEA: CEA: 0.5 ng/mL (ref 0.0–4.7)

## 2015-09-29 ENCOUNTER — Telehealth: Payer: Self-pay | Admitting: *Deleted

## 2015-09-29 NOTE — Telephone Encounter (Signed)
-----   Message from [name], MD sent at 09/28/2015  5:41 PM EDT ----- Please call patient, cea is normal

## 2015-09-29 NOTE — Telephone Encounter (Signed)
Pt informed of normal CEA. She voiced appreciation for call.

## 2015-10-03 ENCOUNTER — Encounter: Payer: Self-pay | Admitting: Genetic Counselor

## 2015-10-04 ENCOUNTER — Ambulatory Visit (HOSPITAL_BASED_OUTPATIENT_CLINIC_OR_DEPARTMENT_OTHER): Payer: Self-pay | Admitting: Genetic Counselor

## 2015-10-04 ENCOUNTER — Other Ambulatory Visit: Payer: Self-pay

## 2015-10-04 DIAGNOSIS — Z801 Family history of malignant neoplasm of trachea, bronchus and lung: Secondary | ICD-10-CM

## 2015-10-04 DIAGNOSIS — Z800 Family history of malignant neoplasm of digestive organs: Secondary | ICD-10-CM

## 2015-10-04 DIAGNOSIS — Z8042 Family history of malignant neoplasm of prostate: Secondary | ICD-10-CM

## 2015-10-04 DIAGNOSIS — Z808 Family history of malignant neoplasm of other organs or systems: Secondary | ICD-10-CM

## 2015-10-04 DIAGNOSIS — Z803 Family history of malignant neoplasm of breast: Secondary | ICD-10-CM

## 2015-10-04 DIAGNOSIS — Z85038 Personal history of other malignant neoplasm of large intestine: Secondary | ICD-10-CM

## 2015-10-05 ENCOUNTER — Encounter: Payer: Self-pay | Admitting: Genetic Counselor

## 2015-10-05 NOTE — Progress Notes (Signed)
REFERRING PROVIDER: [name]., MD  PRIMARY PROVIDER:  No primary care provider on file.  PRIMARY REASON FOR VISIT:  1. History of colon cancer   2. Family history of colon cancer   3. Family history of breast cancer in female   78. Family history of lung cancer   5. Family history of prostate cancer   6. Family history of bone cancer      HISTORY OF PRESENT ILLNESS:   [name], a 40 y.o. female, was seen for a [facility] cancer genetics consultation at the request of Dr. Benay Spice due to a personal history of early-onset colon cancer and family history of colon, breast, and other cancers.  [name] presents to clinic today to discuss the possibility of a hereditary predisposition to cancer, genetic testing, and to further clarify her future cancer risks, as well as potential cancer risks for family members.   In June 2015, at the age of 65, [name] was diagnosed with adenocarcinoma of the ascending colon.  Microsatellite and immunohistochemistry of the tumor was normal. This was treated with surgical resection and FOLFOX.    HORMONAL RISK FACTORS:  Menarche was at age 30.  First live birth at age 68.  OCP use for approximately 2 years remotely (depo provera).  Ovaries intact: no.  Hysterectomy: yes, in 2006 for endometriosis.  Menopausal status: postmenopausal - surgical.  HRT use: 0 years. Colonoscopy: yes; she reports no additional colon polyps. Mammogram within the last year: no, she is currently overdue. Number of breast biopsies: 0. Up to date with pelvic exams:  n/a. Any excessive radiation exposure/other exposures in the past:  Reports history of some secondhand smoke exposure  Past Medical History:  Diagnosis Date  . ADD (attention deficit disorder)   . Anemia, iron deficiency   . Anxiety   . Colon cancer ([place] [facility]) 08/2013  . Depression   . Difficulty swallowing pills   . GERD (gastroesophageal reflux disease)   . Headache    migraines "long time ago"  .  History of blood transfusion 08/16/2013  . History of migraine    no problems in "a long time"  . Restless leg syndrome   . Seizures ([place])    last seizure 08/2012    Past Surgical History:  Procedure Laterality Date  . ABDOMINAL HYSTERECTOMY  08/24/2004   partial  . APPENDECTOMY  08/24/2004  . COLON SURGERY  08/11/2013   removed a foot of colon  . colonoscopy 10-18-14    . COLONOSCOPY WITH PROPOFOL  07/07/2013  . CYSTOSCOPY  11/01/2003  . ENDOMETRIAL FULGURATION  11/01/2003  . ESOPHAGOGASTRODUODENOSCOPY  07/07/2013  . LAPAROSCOPIC LYSIS OF ADHESIONS  11/01/2003  . LAPAROSCOPIC PARTIAL COLECTOMY Right 08/11/2013   Procedure: LAPAROSCOPIC RIGHT HEMICOLECTOMY;  Surgeon: Stark Klein, MD;  Location: [facility];  Service: General;  Laterality: Right;  . LAPAROTOMY N/A 12/28/2014   Procedure: EXPLORATORY LAPAROTOMY;  Surgeon: Everitt Amber, MD;  Location: [facility];  Service: Gynecology;  Laterality: N/A;  . LEFT OOPHORECTOMY  08/24/2004  . PANNICULECTOMY  08/24/2004  . PORT-A-CATH REMOVAL Left 06/08/2014   Procedure: REMOVAL PORT-A-CATH;  Surgeon: Stark Klein, MD;  Location: [facility];  Service: General;  Laterality: Left;  . PORTACATH PLACEMENT Left 09/09/2013   Procedure: INSERTION PORT-A-CATH;  Surgeon: Stark Klein, MD;  Location: [facility];  Service: General;  Laterality: Left;  . SALPINGOOPHORECTOMY Right 12/28/2014   Procedure: RIGHT SALPINGO OOPHORECTOMY;  Surgeon: Everitt Amber, MD;  Location: [facility];  Service: Gynecology;  Laterality: Right;  . TUBAL LIGATION  11/07/2000  . UNILATERAL SALPINGECTOMY Left 08/24/2004    Social History   Social History  . Marital status: Single    Spouse name: N/A  . Number of children: 2  . Years of education: N/A   Occupational History  . Unemployed- full time student     Completed 12th grade   Social History Main Topics  . Smoking status: Never Smoker  . Smokeless tobacco: Never Used  . Alcohol use No  . Drug use: No  . Sexual activity: Yes    Birth  control/ protection: Surgical   Other Topics Concern  . None   Social History Narrative  . None     FAMILY HISTORY:  We obtained a detailed, 4-generation family history.  Significant diagnoses are listed below: Family History  Problem Relation Age of Onset  . Heart attack Mother   . Colon polyps Mother     3+ colon polyps at each colonoscopy - "several"  . Colitis Mother   . Diabetes Father   . Heart attack Father   . Stroke Father   . Congestive Heart Failure Father   . Cirrhosis Maternal Aunt   . Lung cancer Maternal Uncle     d. 20s; smoker and worked around asbestos  . Other Paternal Grandfather     "bone cancer"  . Colon cancer Maternal Uncle     dx early 101s  . Cirrhosis Maternal Aunt     d. 7s  . Breast cancer Maternal Aunt     dx 50s-60s; s/p lumpectomy  . Dementia Maternal Aunt   . Other Cousin     maternal 1st cousin w/ "lung issues"  . Dementia Paternal Aunt   . Dementia Paternal Uncle   . Prostate cancer Paternal Uncle 53  . Esophageal cancer Neg Hx   . Rectal cancer Neg Hx   . Stomach cancer Neg Hx     [name] has one son, age 54, and one daughter, age 48.  She has three maternal half-sisters, ages 53-49, and three paternal half-sisters.  One of her paternal half-sisters died of an overdose in her 19s.  [name] reports no cancer history for her sisters or for any of their children.    [name] mother is currently 54. Her mother has a history of "several colon polyps", but she has not been diagnosed with cancer.  [name] reports that her mother has been going for colonoscopies every year.  [name] mother has three full brothers and three full sisters, all of whom have passed away.  Two brothers had cancer--one was a smoker and died of lung cancer in his 45s, the other was diagnosed with and passed away from colon cancer in his early 2s.  One sister was diagnosed with breast cancer in her 72s-60s.  [name] reports no known cancer history  for her maternal first cousins.  Her maternal grandparents passed away before she was born, so she has no information for them or for their families.  [name] father died of congestive heart failure at 64.  He had three full sisters and five full brothers.  Four of his brothers and one sister are still living.  One brother has a history of prostate cancer diagnosed at 29.  [name] reports no cancer history for her paternal first cousins.  Her paternal grandmother passed away before she was born.  Her grandfather died of a "bone cancer" before she was born.  She is unaware of any previous family history of genetic testing for hereditary cancer.  Patient's maternal and paternal ancestors are of Native American/Cherokee and Caucasian descent. There is no reported Ashkenazi Jewish ancestry. There is no known consanguinity.  GENETIC COUNSELING ASSESSMENT: [name] is a 40 y.o. female with a personal and family history of cancer which is somewhat suggestive of a hereditary colorectal cancer syndrome and predisposition to cancer. We, therefore, discussed and recommended the following at today's visit.   DISCUSSION: We reviewed the characteristics, features and inheritance patterns of hereditary cancer syndromes, particularly those caused by mutations within the Lynch syndrome, APC, and MUTYH genes. We also discussed genetic testing, including the appropriate family members to test, the process of testing, insurance coverage and turn-around-time for results. We discussed the implications of a negative, positive and/or variant of uncertain significant result. We recommended [name] pursue genetic testing for the 19-gene Colorectal Cancer Panel with MSH2 Exons 1-7 Inversion Analysis through [facility].  The Colorectal Cancer Panel offered by [facility] includes sequencing and/or duplication/deletion testing of the following 19 genes: APC, ATM, AXIN2, BMPR1A, CDH1, CHEK2, EPCAM, MLH1, MSH2,  MSH6, MUTYH, PMS2, POLD1, POLE, PTEN, SCG5/GREM1, SMAD4, STK11, and TP53.    Based on [name]'s personal history of cancer, she meets medical criteria for genetic testing. [name] has [place] Medicaid, and so the cost of testing should be covered in full.  If she receives a bill, this will have been sent in error and she is advised to call us.  PLAN: After considering the risks, benefits, and limitations, [name]  provided informed consent to pursue genetic testing and the blood sample was sent to [facility] for analysis of the 19-gene Colorectal Cancer Panel with MSH2 Exons 1-7 Inversion Analysis. Results should be available within approximately 2-3 weeks' time, at which point they will be disclosed by telephone to [name], as will any additional recommendations warranted by these results. [name] will receive a summary of her genetic counseling visit and a copy of her results once available. This information will also be available in Epic. We encouraged [name] to remain in contact with cancer genetics annually so that we can continuously update the family history and inform her of any changes in cancer genetics and testing that may be of benefit for her family. [name] questions were answered to her satisfaction today. Our contact information was provided should additional questions or concerns arise.  Thank you for the referral and allowing [place] to share in the care of your patient.   Jeanine Luz, MS, [facility] Certified Genetic Counselor [name] Phone: [phone]  The patient was seen for a total of 60 minutes in face-to-face genetic counseling.  This patient was discussed with Drs. Magrinat, Lindi Adie and/or Burr Medico who agrees with the above.    _______________________________________________________________________ For Office Staff:  Number of people involved in session: 1 Was an Intern/ student involved with case: no

## 2015-10-06 ENCOUNTER — Telehealth: Payer: Self-pay | Admitting: Nurse Practitioner

## 2015-10-06 NOTE — Telephone Encounter (Signed)
CALLED PATIENT TO CONF APPTS PER 09/27/15 LOS. L/M APPT LTR AND SCHD MAILED. 10/06/15 °

## 2015-10-17 ENCOUNTER — Telehealth: Payer: Self-pay | Admitting: *Deleted

## 2015-10-17 NOTE — Telephone Encounter (Signed)
Returned call to pt, she reports mild nausea for past 2 days. Headaches began 3 days ago they are similar to the migraines she had in the past. Pt denies any neurologic symptoms. Informed pt she should be evaluated by a PCP/ urgent care provider.  Pt wonders if estrace dose should be increased. Dr. Benay Spice made aware of call. Discussed with Joylene John, GYN/ONC NP: Not likely related to estradiol levels since this began acutely. She will discuss with Dr. Denman George.

## 2015-10-17 NOTE — Telephone Encounter (Signed)
TC from patient stating she is experiencing headaches for the last 3 days-frontal in location. She rates the pain at a 7 out of 10.  She has been taking ibuprofen and excedrin migraine w/o much relief. Pt does not have PCP.  She is not currently on active chemo.  Pt states she has had migraines in the past but none since her hysterectomy. She is asking is she can be seen today/tomorrow.

## 2015-10-19 NOTE — Telephone Encounter (Signed)
Discussed with Lenna Sciara, NP: Dr. Denman George is OK with pt increasing Estradiol to 1 mg daily. This may not help headaches but OK for pt to try.  Dr. Benay Spice made aware. Left message on voicemail informing pt of above. Requested she call with an update.

## 2015-10-21 ENCOUNTER — Telehealth: Payer: Self-pay | Admitting: *Deleted

## 2015-10-21 NOTE — Telephone Encounter (Signed)
Call placed to patient to check on migraine.  Pt states that "headache is much better" and she feels as though this may be "sinus issues" at this time.  She states that she took Estradiol 1 mg for one day only, and then stopped d/t she was "very emotional" that day.  Pt encouraged to call [facility] back if headaches persist or any other issues arise.  Pt appreciative of call.

## 2015-10-24 ENCOUNTER — Telehealth: Payer: Self-pay | Admitting: Genetic Counselor

## 2015-10-26 ENCOUNTER — Ambulatory Visit: Payer: Self-pay | Admitting: Genetic Counselor

## 2015-10-26 DIAGNOSIS — Z809 Family history of malignant neoplasm, unspecified: Secondary | ICD-10-CM

## 2015-10-26 DIAGNOSIS — Z800 Family history of malignant neoplasm of digestive organs: Secondary | ICD-10-CM

## 2015-10-26 DIAGNOSIS — Z1379 Encounter for other screening for genetic and chromosomal anomalies: Secondary | ICD-10-CM

## 2015-10-26 DIAGNOSIS — Z85038 Personal history of other malignant neoplasm of large intestine: Secondary | ICD-10-CM

## 2015-10-26 DIAGNOSIS — Z8371 Family history of colonic polyps: Secondary | ICD-10-CM

## 2015-10-26 NOTE — Telephone Encounter (Signed)
Discussed with [name] that her genetic test results were negative for known pathogenic mutations within any of 19 genes on the Colorectal Cancer Panel through [facility].  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  One VUS, called "c.7043C>T (p.[id number])" was found in one copy of the APC gene.  The other VUS, called "c.270delC (p.Asp90GlufsX59)" was found in one copy of the POLE gene.  Discussed that we treat these just like negative test results until they get updated by the lab and reviewed why we do that.  Encouraged her to keep her phone number up-to-date with [place], in case these are updated in the future.  She should continue to follow her doctors' recommendations for future cancer screening.  Her sisters should all be having colonoscopies.  Her children can get their first colonoscopy at 38.  She is welcome to call with any questions.  I will mail her a copy of her result.

## 2015-11-12 ENCOUNTER — Encounter (HOSPITAL_COMMUNITY): Payer: Self-pay

## 2015-11-12 ENCOUNTER — Emergency Department (HOSPITAL_COMMUNITY): Payer: Medicaid Other

## 2015-11-12 ENCOUNTER — Emergency Department (HOSPITAL_COMMUNITY)
Admission: EM | Admit: 2015-11-12 | Discharge: 2015-11-12 | Disposition: A | Discharge status: Home / Self Care | Payer: Medicaid Other | Attending: Emergency Medicine | Admitting: Emergency Medicine

## 2015-11-12 DIAGNOSIS — S93401A Sprain of unspecified ligament of right ankle, initial encounter: Secondary | ICD-10-CM | POA: Insufficient documentation

## 2015-11-12 DIAGNOSIS — Y9301 Activity, walking, marching and hiking: Secondary | ICD-10-CM | POA: Insufficient documentation

## 2015-11-12 DIAGNOSIS — Z85038 Personal history of other malignant neoplasm of large intestine: Secondary | ICD-10-CM | POA: Insufficient documentation

## 2015-11-12 DIAGNOSIS — Y9289 Other specified places as the place of occurrence of the external cause: Secondary | ICD-10-CM | POA: Insufficient documentation

## 2015-11-12 DIAGNOSIS — X509XXA Other and unspecified overexertion or strenuous movements or postures, initial encounter: Secondary | ICD-10-CM | POA: Insufficient documentation

## 2015-11-12 DIAGNOSIS — Y999 Unspecified external cause status: Secondary | ICD-10-CM | POA: Insufficient documentation

## 2015-11-12 MED ORDER — NAPROXEN 500 MG PO TABS: 500.0000 mg | ORAL_TABLET | Freq: Two times a day (BID) | ORAL | 0 refills | Status: DC

## 2015-11-12 NOTE — ED Triage Notes (Signed)
PT C/O RIGHT ANKLE PAIN AND SWELLING AFTER SHE TWISTED HER FOOT STEPPING ON A ROCK DURING A NATURE TRAIL WALK. DENIES HEAD INJURY OR LOC.

## 2015-11-12 NOTE — ED Provider Notes (Signed)
[name] DEPT Provider Note   CSN: [id number] Arrival date & time: 11/12/15  2109     History   Chief Complaint Chief Complaint  Patient presents with  . Ankle Pain    RIGHT    HPI [name] is a 40 y.o. female.  Patient presents today with a chief complaint of right ankle pain.  She has pain and swelling of the lateral malleolus.  Pain has been present since rolling her ankle while at [place] around 1 PM today.  She has been ambulatory since that time, but reports increased pain with ambulation.  She has been taking Ibuprofen for pain with some relief.  She denies any other pain or injuries.  Denies numbness or tingling.      Past Medical History:  Diagnosis Date  . ADD (attention deficit disorder)   . Anemia, iron deficiency   . Anxiety   . Colon cancer ([place]) 08/2013  . Depression   . Difficulty swallowing pills   . GERD (gastroesophageal reflux disease)   . Headache    migraines "long time ago"  . History of blood transfusion 08/16/2013  . History of migraine    no problems in "a long time"  . Restless leg syndrome   . Seizures ([place])    last seizure 08/2012    Patient Active Problem List   Diagnosis Date Noted  . Sinusitis 01/29/2015  . Pelvic mass in female 12/28/2014  . Ovarian mass, right   . Hypokalemia 02/03/2014  . Nausea 12/20/2013  . Fever 12/18/2013  . Dehydration 12/18/2013  . Diarrhea 10/28/2013  . Malfunction of device 10/28/2013  . Weight loss 10/28/2013  . Mucositis (ulcerative) due to antineoplastic therapy 10/28/2013  . Anemia due to antineoplastic chemotherapy 09/25/2013  . Chemotherapy induced thrombocytopenia 09/25/2013  . Hypomagnesemia 09/25/2013  . Hypophosphatemia 09/25/2013  . Adverse drug effect 09/25/2013  . Abdominal pain 09/24/2013  . Cancer of ascending colon s/p right colectomy 08/11/2013 08/05/2013  . Anemia, iron deficiency 01/28/2013  . 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  Discoloration of skin of face 09/03/2012  . Medication reaction 05/03/2012  . Migraine 03/31/2012  . Hyperglycemia 01/18/2012  . Fatigue 12/12/2011  . ADD (attention deficit disorder) 09/23/2010  . Right wrist pain 05/01/2010  . LIBIDO, DECREASED 04/07/2010  . IRON DEFICIENCY 05/17/2009  . ANA POSITIVE 05/17/2009  . OVARIAN CYST 05/10/2009  . FATTY LIVER DISEASE 03/24/2009  . TRANSAMINASES, SERUM, ELEVATED 03/21/2009  . Gastroparesis 08/16/2008  . DEPRESSION/ANXIETY 09/19/2006  . HYPERLIPIDEMIA 08/16/2006  . RESTLESS LEG SYNDROME 08/16/2006  . GERD 08/16/2006  . IBS 08/16/2006    Past Surgical History:  Procedure Laterality Date  . ABDOMINAL HYSTERECTOMY  08/24/2004   partial  . APPENDECTOMY  08/24/2004  . COLON SURGERY  08/11/2013   removed a foot of colon  . colonoscopy 10-18-14    . COLONOSCOPY WITH PROPOFOL  07/07/2013  . CYSTOSCOPY  11/01/2003  . ENDOMETRIAL FULGURATION  11/01/2003  . ESOPHAGOGASTRODUODENOSCOPY  07/07/2013  . LAPAROSCOPIC LYSIS OF ADHESIONS  11/01/2003  . LAPAROSCOPIC PARTIAL COLECTOMY Right 08/11/2013   Procedure: LAPAROSCOPIC RIGHT HEMICOLECTOMY;  Surgeon: Stark Klein, MD;  Location: [facility];  Service: General;  Laterality: Right;  . LAPAROTOMY N/A 12/28/2014   Procedure: EXPLORATORY LAPAROTOMY;  Surgeon: Everitt Amber, MD;  Location: [facility];  Service: Gynecology;  Laterality: N/A;  . LEFT OOPHORECTOMY  08/24/2004  . PANNICULECTOMY  08/24/2004  . PORT-A-CATH REMOVAL Left 06/08/2014   Procedure: REMOVAL PORT-A-CATH;  Surgeon:  Stark Klein, MD;  Location: [facility];  Service: General;  Laterality: Left;  . PORTACATH PLACEMENT Left 09/09/2013   Procedure: INSERTION PORT-A-CATH;  Surgeon: Stark Klein, MD;  Location: [facility];  Service: General;  Laterality: Left;  . SALPINGOOPHORECTOMY Right 12/28/2014   Procedure: RIGHT SALPINGO OOPHORECTOMY;  Surgeon: Everitt Amber, MD;  Location: [facility];  Service: Gynecology;  Laterality: Right;  . TUBAL LIGATION  11/07/2000  .  UNILATERAL SALPINGECTOMY Left 08/24/2004    OB History    No data available       Home Medications    Prior to Admission medications   Medication Sig Start Date End Date Taking? Authorizing Provider  estradiol (ESTRACE) 0.5 MG tablet Take 1 tablet (0.5 mg total) by mouth daily. 01/13/15   Everitt Amber, MD  LORazepam (ATIVAN) 1 MG tablet Take 1 tablet (1 mg total) by mouth 2 (two) times daily as needed for anxiety or sleep. 06/30/15   [name], MD  Multiple Vitamins-Minerals (MULTIVITAMIN WITH MINERALS) tablet Take 1 tablet by mouth daily.    Historical Provider, MD  ondansetron (ZOFRAN) 8 MG tablet TAKE ONE TABLET BY MOUTH EVERY 8 HOURS AS NEEDED FOR NAUSEA OR VOMITING. Patient not taking: Reported on 06/30/2015 08/16/14   [name], MD    Family History Family History  Problem Relation Age of Onset  . Heart attack Mother   . Colon polyps Mother     3+ colon polyps at each colonoscopy - "several"  . Colitis Mother   . Diabetes Father   . Heart attack Father   . Stroke Father   . Congestive Heart Failure Father   . Cirrhosis Maternal Aunt   . Lung cancer Maternal Uncle     d. 53s; smoker and worked around asbestos  . Other Paternal Grandfather     "bone cancer"  . Colon cancer Maternal Uncle     dx early 76s  . Cirrhosis Maternal Aunt     d. 31s  . Breast cancer Maternal Aunt     dx 50s-60s; s/p lumpectomy  . Dementia Maternal Aunt   . Other Cousin     maternal 1st cousin w/ "lung issues"  . Dementia Paternal Aunt   . Dementia Paternal Uncle   . Prostate cancer Paternal Uncle 39  . Esophageal cancer Neg Hx   . Rectal cancer Neg Hx   . Stomach cancer Neg Hx     Social History Social History  Substance Use Topics  . Smoking status: Never Smoker  . Smokeless tobacco: Never Used  . Alcohol use No     Allergies   Adhesive [tape]; Eggs or egg-derived products; Metoclopramide hcl; Oxycodone-acetaminophen; Sulfa antibiotics; and Penicillins   Review of  Systems Review of Systems  All other systems reviewed and are negative.    Physical Exam Updated Vital Signs BP 147/79 (BP Location: Right Arm)   Pulse 70   Temp 98.7 F (37.1 C) (Oral)   Resp 18   Ht 5\' 6"  (1.676 m)   Wt 77.1 kg   SpO2 98%   BMI 27.44 kg/m   Physical Exam  Constitutional: She appears well-developed and well-nourished.  HENT:  Head: Normocephalic and atraumatic.  Cardiovascular: Normal rate, regular rhythm and normal heart sounds.   Pulses:      Dorsalis pedis pulses are 2+ on the right side.  Pulmonary/Chest: Effort normal and breath sounds normal.  Musculoskeletal: Normal range of motion.       Right hip:  She exhibits normal range of motion and no tenderness.       Right knee: She exhibits normal range of motion. No tenderness found.       Right ankle: She exhibits swelling. She exhibits normal range of motion and no laceration. Tenderness. Lateral malleolus tenderness found.       Right foot: There is normal range of motion, no bony tenderness and no swelling.  Neurological: She is alert.  Distal sensation of the right foot intact  Skin: Skin is warm and dry.  Psychiatric: She has a normal mood and affect.  Nursing note and vitals reviewed.    ED Treatments / Results  Labs (all labs ordered are listed, but only abnormal results are displayed) Labs Reviewed - No data to display  EKG  EKG Interpretation None       Radiology Dg Ankle Complete Right  Result Date: 11/12/2015 CLINICAL DATA:  Lateral pain and swelling to the right ankle after injury today. EXAM: RIGHT ANKLE - COMPLETE 3+ VIEW COMPARISON:  None. FINDINGS: Lateral soft tissue swelling about the right ankle. IMPRESSION: Negative. Electronically Signed   By: Lucienne Capers M.D.   On: 11/12/2015 21:54    Procedures Procedures (including critical care time)  Medications Ordered in ED Medications - No data to display   Initial Impression / Assessment and Plan / ED Course  I  have reviewed the triage vital signs and the nursing notes.  Pertinent labs & imaging results that were available during my care of the patient were reviewed by me and considered in my medical decision making (see chart for details).  Clinical Course   Patient presents today with right ankle pain after rolling her ankle while at [place] earlier today.  Xray is negative.  Neurovascularly intact.  Given Ankle ASO and crutches.  Stable for discharge.  Return precautions given.  Final Clinical Impressions(s) / ED Diagnoses   Final diagnoses:  None    New Prescriptions New Prescriptions   No medications on file     Hyman Bible, PA-C 11/12/15 [place], MD 11/13/15 [phone]

## 2015-11-13 DIAGNOSIS — Z1379 Encounter for other screening for genetic and chromosomal anomalies: Secondary | ICD-10-CM | POA: Insufficient documentation

## 2015-11-13 NOTE — Progress Notes (Signed)
GENETIC TEST RESULT  HPI: [name] was previously seen in the [facility] due to a personal history of colon cancer at age 40, family history of colon cancer and polyps, and concerns regarding a hereditary predisposition to cancer. Please refer to our prior cancer genetics clinic note from October 04, 2015 for more information regarding [name]'s medical, social and family histories, and our assessment and recommendations, at the time. [name] recent genetic test results were disclosed to her, as were recommendations warranted by these results. These results and recommendations are discussed in more detail below.  GENETIC TEST RESULTS: At the time of [name]'s visit on 10/04/15, we recommended she pursue genetic testing of the 19-gene Colorectal Cancer Panel with MSH2 Exons 1-7 Inversion Analysis through [facility] [employer] [facility].  The Colorectal Cancer Panel offered by [facility] Emily Pigeon, MD) includes sequencing and/or duplication/deletion testing of the following 19 genes: APC, ATM, AXIN2, BMPR1A, CDH1, CHEK2, EPCAM, MLH1, MSH2, MSH6, MUTYH, PMS2, POLD1, POLE, PTEN, SCG5/GREM1, SMAD4, STK11, and TP53.  Those results are now back,  report date for which is October 20, 2015.  Genetic testing was normal, and did not reveal a known deleterious mutation in these genes.  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 The test report will be scanned into EPIC and will be located under the Results Review tab in the Pathology>Molecular Pathology section.   Genetic testing did identify two variants of uncertain significance (VUSes).  One VUS called "c.7043C>T (p.Thr2348Ile)" was found in one copy of the APC gene.  Another VUS called "c.270delC (p.Asp90GlufsX59)" was found in one copy of the POLE gene.  At this time, it is unknown if these VUSes are associated with an increased risk for cancer or if these are normal findings. Since these VUSes are an uncertain  result, they cannot help guide screening recommendations, and family members should not be tested for these VUSes to help define their own cancer risks.  Also, we all have variants within our genes that make [place] unique individuals--most of these variants are benign.  Thus, we treat these VUSes as a negative result until they get updated by the lab.   With time, we suspect the lab will reclassify this variant and when they do, we will try to re-contact [name] to discuss the reclassification further.  We also encouraged [name] to contact us in a year or two to obtain an update on the status of these VUSes.  We discussed with [name] that since the current genetic testing is not perfect, it is possible there may be a gene mutation in one of these genes that current testing cannot detect, but that chance is small. We also discussed, that it is possible that another gene that has not yet been discovered, or that we have not yet tested, is responsible for the cancer diagnoses in the family, and it is, therefore, important to remain in touch with cancer genetics in the future so that we can continue to offer [name] the most up-to-date genetic testing.   CANCER SCREENING RECOMMENDATIONS: We still do not have an explanation for the personal and family history of cancer.  This result may be reassuring and indicate that [name] likely does not have an increased risk for a future cancer due to a mutation in one of these genes. This normal test also suggests that [name]'s cancer was most likely not due to an inherited predisposition associated with one of these genes. There  are still many of [name]'s family members who have never been diagnosed with cancer, or, if they have, they have been diagnosed at later ages.  Most cancers happen by chance and this negative test suggests that her cancer falls into this category.  We, therefore, recommended she continue to follow the cancer management and  screening guidelines provided by her oncology and primary healthcare providers.   RECOMMENDATIONS FOR FAMILY MEMBERS: Men and women in this family might be at some increased risk of developing cancer, over the general population risk, simply due to the family history of cancer. We recommended women in this family have a yearly mammogram beginning at age 53, or 18 years younger than the earliest onset of cancer, an annual clinical breast exam, and perform monthly breast self-exams. Women in this family should also have a gynecological exam as recommended by their primary provider. All family members should have a colonoscopy by age 71, except for [name]'s close relatives who can begin getting colonoscopies early due history and her age at diagnosis. [name] son and daughter can begin getting colonscopies at the age of 68.  All of [name]'s sisters should be having colonoscopies now.  FOLLOW-UP: Lastly, we discussed with [name] that cancer genetics is a rapidly advancing field and it is possible that new genetic tests will be appropriate for her and/or her family members in the future. We encouraged her to remain in contact with cancer genetics on an annual basis so we can update her personal and family histories and let her know of advances in cancer genetics that may benefit this family.   Our contact number was provided. [name] questions were answered to her satisfaction, and she knows she is welcome to call us at anytime with additional questions or concerns.   Jeanine Luz, MS, [facility] Certified Genetic Counselor [email or web address] Phone: [phone]

## 2015-12-22 ENCOUNTER — Telehealth: Payer: Self-pay | Admitting: Nurse Practitioner

## 2015-12-22 ENCOUNTER — Other Ambulatory Visit (HOSPITAL_BASED_OUTPATIENT_CLINIC_OR_DEPARTMENT_OTHER): Payer: Self-pay

## 2015-12-22 ENCOUNTER — Ambulatory Visit (HOSPITAL_BASED_OUTPATIENT_CLINIC_OR_DEPARTMENT_OTHER): Payer: Self-pay | Admitting: Nurse Practitioner

## 2015-12-22 VITALS — BP 122/87 | HR 80 | Temp 98.8°F | Resp 16 | Ht 66.0 in | Wt 173.9 lb

## 2015-12-22 DIAGNOSIS — D6959 Other secondary thrombocytopenia: Secondary | ICD-10-CM

## 2015-12-22 DIAGNOSIS — G620 Drug-induced polyneuropathy: Secondary | ICD-10-CM

## 2015-12-22 DIAGNOSIS — C182 Malignant neoplasm of ascending colon: Secondary | ICD-10-CM

## 2015-12-22 LAB — CEA (IN HOUSE-CHCC)

## 2015-12-22 NOTE — Telephone Encounter (Signed)
Appointments scheduled per 11/16 LOS. Patient given AVS report and calendars with future scheduled appointments.  Patient aware of CT scan. Given two bottles of Contrast and instructions.

## 2015-12-22 NOTE — Progress Notes (Signed)
  [facility] OFFICE PROGRESS NOTE   Diagnosis:  Colon cancer  INTERVAL HISTORY:   Ms. Durene Romans returns as scheduled. She feels well. Only complaint is "sinus drainage". Bowels are moving regularly. No bloody or black stools. No abdominal pain. No nausea or vomiting. She has a good appetite.  Objective:  Vital signs in last 24 hours:  Blood pressure 122/87, pulse 80, temperature 98.8 F (37.1 C), temperature source Oral, resp. rate 16, height '5\' 6"'$  (1.676 m), weight 173 lb 14.4 oz (78.9 kg), SpO2 100 %.    HEENT: No neck mass. Lymphatics: No palpable cervical, supra clavicular, axillary or inguinal lymph nodes. Resp: Lungs clear bilaterally. Cardio: Regular rate and rhythm. GI: Abdomen soft and nontender. No hepatomegaly. No mass. Vascular: No leg edema.   Lab Results:  Lab Results  Component Value Date   WBC 6.4 12/29/2014   HGB 10.9 (L) 12/29/2014   HCT 32.8 (L) 12/29/2014   MCV 82.2 12/29/2014   PLT 131 (L) 12/29/2014   NEUTROABS 3.1 12/20/2014    Imaging:  No results found.  Medications: I have reviewed the patient's current medications.  Assessment/Plan: 1. 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  The tumor returned microsatellite stable with no loss of mismatch repair protein expression   APC mutated. No BRAF, KRAS, or NRAS mutation On [facility] 1 testing   Cycle 1 adjuvant FOLFOX 09/08/2013   Cycle 2 adjuvant FOLFOX 09/24/2013   Cycle 3 adjuvant FOLFOX 10/08/2013.   Cycle 4 adjuvant FOLFOX 10/22/2013.   Cycle 5 adjuvant FOLFOX 11/05/2013. Oxaliplatin held due to thrombocytopenia.  Cycle 6 FOLFOX 11/19/2013.  Cycle 7 FOLFOX 12/03/2013. Oxaliplatin held secondary to thrombocytopenia.  Cycle 8 FOLFOX 12/17/2013.  Cycle 9 FOLFOX 01/04/2014. Oxaliplatin held secondary to neutropenia.   Cycle 10 FOLFOX 01/21/2014. Oxaliplatin  held secondary to thrombocytopenia.  Cycle 11 FOLFOX 02/04/2014  Cycle 12 FOLFOX 02/18/2014, oxaliplatin dose reduced secondary to thrombocytopenia  CT abdomen/pelvis 01/30/2014 revealed splenomegaly and no evidence of recurrent colon cancer  CT chest 04/07/2014 with a stable right lower lobe nodule and no evidence for metastatic disease, no nodules seen on the CT 11/26/2014  Markedly elevated CEA 11/24/2014  CT 11/26/2014 revealed a right pelvic mass, splenomegaly, small volume ascites  Right salpingo-oophorectomy 12/28/2014 with the pathology confirming metastatic colon cancer 2. Mild elevation of the CEA beginning January 2016, normal on 05/19/2014  3. History of iron deficiency anemia  4. seizure disorder  5. history of depression  6. 4 mm right lower lobe nodule on a staging chest CT 09/08/2013 , stable on a CT 04/07/2014 7. Hospitalization 09/24/2013 through 09/26/2013 with fever and abdominal pain.  8. 09/24/2013 urine culture positive for coag negative staph.  9. 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 Splenomegaly noted on a CT scan 01/30/2014, the spleen is not palpable today   Disposition: Ms. Durene Romans appears well. There is no clinical evidence of disease progression. We will follow-up on the CEA from today. She will return for an office visit in 3 months with lab/CT a few days prior. She will contact the office in the interim with any problems.  Plan reviewed with Dr. Benay Spice.    Ned Card ANP/GNP-BC   12/22/2015  2:05 PM

## 2015-12-23 ENCOUNTER — Telehealth: Payer: Self-pay

## 2015-12-23 LAB — CEA: CEA: 0.6 ng/mL (ref 0.0–4.7)

## 2015-12-23 NOTE — Telephone Encounter (Signed)
Called and informed pt of normal cea results. Pt verbalized understanding and denies any questions or concerns at this time. 

## 2015-12-23 NOTE — Telephone Encounter (Signed)
-----   Message from [name], MD sent at 12/22/2015  8:42 PM EST ----- Please call patient, cea is normal

## 2015-12-26 ENCOUNTER — Encounter: Payer: Self-pay | Admitting: Genetic Counselor

## 2016-02-24 ENCOUNTER — Other Ambulatory Visit: Payer: Self-pay | Admitting: Nurse Practitioner

## 2016-03-23 ENCOUNTER — Other Ambulatory Visit (HOSPITAL_BASED_OUTPATIENT_CLINIC_OR_DEPARTMENT_OTHER): Payer: BLUE CROSS/BLUE SHIELD

## 2016-03-23 ENCOUNTER — Ambulatory Visit (HOSPITAL_COMMUNITY)
Admission: RE | Admit: 2016-03-23 | Discharge: 2016-03-23 | Disposition: A | Discharge status: Home / Self Care | Payer: Medicaid Other | Source: Ambulatory Visit | Attending: Nurse Practitioner | Admitting: Nurse Practitioner

## 2016-03-23 ENCOUNTER — Encounter (HOSPITAL_COMMUNITY): Payer: Self-pay

## 2016-03-23 DIAGNOSIS — R161 Splenomegaly, not elsewhere classified: Secondary | ICD-10-CM | POA: Insufficient documentation

## 2016-03-23 DIAGNOSIS — Z90710 Acquired absence of both cervix and uterus: Secondary | ICD-10-CM | POA: Insufficient documentation

## 2016-03-23 DIAGNOSIS — Z9889 Other specified postprocedural states: Secondary | ICD-10-CM | POA: Insufficient documentation

## 2016-03-23 DIAGNOSIS — C182 Malignant neoplasm of ascending colon: Secondary | ICD-10-CM | POA: Diagnosis not present

## 2016-03-23 LAB — COMPREHENSIVE METABOLIC PANEL
ALK PHOS: 83 U/L (ref 40–150)
ALT: 48 U/L (ref 0–55)
AST: 36 U/L — ABNORMAL HIGH (ref 5–34)
Albumin: 4.1 g/dL (ref 3.5–5.0)
Anion Gap: 9 mEq/L (ref 3–11)
BUN: 11.7 mg/dL (ref 7.0–26.0)
CHLORIDE: 105 meq/L (ref 98–109)
CO2: 27 meq/L (ref 22–29)
Calcium: 10.1 mg/dL (ref 8.4–10.4)
Creatinine: 0.9 mg/dL (ref 0.6–1.1)
EGFR: 81 mL/min/{1.73_m2} — AB (ref >90)
GLUCOSE: 147 mg/dL — AB (ref 70–140)
POTASSIUM: 4 meq/L (ref 3.5–5.1)
SODIUM: 141 meq/L (ref 136–145)
Total Bilirubin: 1.24 mg/dL — ABNORMAL HIGH (ref 0.20–1.20)
Total Protein: 7.3 g/dL (ref 6.4–8.3)

## 2016-03-23 LAB — CEA (IN HOUSE-CHCC): CEA (CHCC-In House): <1 ng/mL (ref 0.00–5.00)

## 2016-03-23 MED ORDER — SODIUM CHLORIDE 0.9 % IJ SOLN
INTRAMUSCULAR | Status: AC
  Filled 2016-03-23: qty 50

## 2016-03-23 MED ORDER — IOPAMIDOL (ISOVUE-300) INJECTION 61%
INTRAVENOUS | Status: AC
  Administered 2016-03-23: 100 mL
  Filled 2016-03-23: qty 100

## 2016-03-26 ENCOUNTER — Ambulatory Visit (HOSPITAL_BASED_OUTPATIENT_CLINIC_OR_DEPARTMENT_OTHER): Payer: BLUE CROSS/BLUE SHIELD | Admitting: Oncology

## 2016-03-26 VITALS — BP 137/81 | HR 73 | Temp 98.6°F | Resp 17 | Ht 66.0 in | Wt 172.6 lb

## 2016-03-26 DIAGNOSIS — C182 Malignant neoplasm of ascending colon: Secondary | ICD-10-CM

## 2016-03-26 DIAGNOSIS — G620 Drug-induced polyneuropathy: Secondary | ICD-10-CM

## 2016-03-26 DIAGNOSIS — D6959 Other secondary thrombocytopenia: Secondary | ICD-10-CM

## 2016-03-26 NOTE — Progress Notes (Signed)
[name] OFFICE PROGRESS NOTE   Diagnosis: Colon cancer  INTERVAL HISTORY:   Mr. returns as scheduled. She reports rhinorrhea, a sore throat, and right ear discomfort for the past 2 days. No other complaint. She is working. No difficulty with bowel or bladder function. No recurrent abdominal mass.  Objective:  Vital signs in last 24 hours:  Blood pressure 137/81, pulse 73, temperature 98.6 F (37 C), temperature source Oral, resp. rate 17, height 5' 6" (1.676 m), weight 172 lb 9.6 oz (78.3 kg), SpO2 100 %.    HEENT: Pharynx without erythema or exudate, examination of the right external canal and tympanic membrane is unremarkable. Lymphatics: No cervical, supraclavicular, axillary, or inguinal nodes Resp: Lungs clear bilaterally Cardio: Regular rate and rhythm GI: No hepatosplenomegaly, no mass, nontender Vascular: No leg edema  Lab Results: 03/23/2016: CEA less than 1  Imaging:  Ct Chest W Contrast  Result Date: 03/23/2016 CLINICAL DATA:  Restaging metastatic colon cancer, diagnosed 2016, status post colon resection and chemotherapy, prior hysterectomy EXAM: CT CHEST, ABDOMEN, AND PELVIS WITH CONTRAST TECHNIQUE: Multidetector CT imaging of the chest, abdomen and pelvis was performed following the standard protocol during bolus administration of intravenous contrast. CONTRAST:  100 mL Isovue 300 IV COMPARISON:  CT abdomen/ pelvis dated 03/11/2015 FINDINGS: CT CHEST FINDINGS Cardiovascular: Heart is normal in size.  No pericardial effusion. No evidence of thoracic aortic aneurysm. Mediastinum/Nodes: Mild soft tissue along the superior mediastinum (series 2/ image 20), favored to be residual thymic tissue. No suspicious mediastinal lymphadenopathy. Visualized thyroid is unremarkable. Lungs/Pleura: Lungs are clear. No suspicious pulmonary nodules. No focal consolidation. No pleural effusion or pneumothorax. Musculoskeletal: Mild degenerative changes of the lower  thoracic spine. CT ABDOMEN PELVIS FINDINGS Hepatobiliary: Liver is within normal limits. No suspicious/enhancing hepatic lesions. Gallbladder is within normal limits. No intrahepatic or extrahepatic ductal dilatation. Pancreas: Within normal limits. Spleen: Mildly enlarged, measuring 14.9 cm in maximal craniocaudal dimension, grossly unchanged. Adrenals/Urinary Tract: Adrenal glands are within normal limits. Kidneys are within normal limits.  No hydronephrosis. Bladder is underdistended. Stomach/Bowel: Stomach is within normal limits. No evidence of bowel obstruction. Status post right hemicolectomy with appendectomy. Vascular/Lymphatic: No evidence of abdominal aortic aneurysm. No suspicious abdominopelvic lymphadenopathy. Reproductive: Status post hysterectomy and bilateral salpingo-oophorectomy. 18 x 9 mm fluid collection along the right pelvic side wall (series 2/ image 107), previously 3.0 x 1.6 cm, favored to reflect improving postoperative seroma. Other: No abdominopelvic ascites. Musculoskeletal: Mild degenerative changes of the lumbar spine. IMPRESSION: Status post right hemicolectomy with appendectomy. Status post hysterectomy and bilateral salpingo-oophorectomy. No evidence of recurrent or metastatic disease. Stable mild splenomegaly. Electronically Signed   By: Julian Hy M.D.   On: 03/23/2016 17:32   Ct Abdomen Pelvis W Contrast  Result Date: 03/23/2016 CLINICAL DATA:  Restaging metastatic colon cancer, diagnosed 2016, status post colon resection and chemotherapy, prior hysterectomy EXAM: CT CHEST, ABDOMEN, AND PELVIS WITH CONTRAST TECHNIQUE: Multidetector CT imaging of the chest, abdomen and pelvis was performed following the standard protocol during bolus administration of intravenous contrast. CONTRAST:  100 mL Isovue 300 IV COMPARISON:  CT abdomen/ pelvis dated 03/11/2015 FINDINGS: CT CHEST FINDINGS Cardiovascular: Heart is normal in size.  No pericardial effusion. No evidence of thoracic  aortic aneurysm. Mediastinum/Nodes: Mild soft tissue along the superior mediastinum (series 2/ image 20), favored to be residual thymic tissue. No suspicious mediastinal lymphadenopathy. Visualized thyroid is unremarkable. Lungs/Pleura: Lungs are clear. No suspicious pulmonary nodules. No focal consolidation. No pleural effusion or pneumothorax. Musculoskeletal:  Mild degenerative changes of the lower thoracic spine. CT ABDOMEN PELVIS FINDINGS Hepatobiliary: Liver is within normal limits. No suspicious/enhancing hepatic lesions. Gallbladder is within normal limits. No intrahepatic or extrahepatic ductal dilatation. Pancreas: Within normal limits. Spleen: Mildly enlarged, measuring 14.9 cm in maximal craniocaudal dimension, grossly unchanged. Adrenals/Urinary Tract: Adrenal glands are within normal limits. Kidneys are within normal limits.  No hydronephrosis. Bladder is underdistended. Stomach/Bowel: Stomach is within normal limits. No evidence of bowel obstruction. Status post right hemicolectomy with appendectomy. Vascular/Lymphatic: No evidence of abdominal aortic aneurysm. No suspicious abdominopelvic lymphadenopathy. Reproductive: Status post hysterectomy and bilateral salpingo-oophorectomy. 18 x 9 mm fluid collection along the right pelvic side wall (series 2/ image 107), previously 3.0 x 1.6 cm, favored to reflect improving postoperative seroma. Other: No abdominopelvic ascites. Musculoskeletal: Mild degenerative changes of the lumbar spine. IMPRESSION: Status post right hemicolectomy with appendectomy. Status post hysterectomy and bilateral salpingo-oophorectomy. No evidence of recurrent or metastatic disease. Stable mild splenomegaly. Electronically Signed   By: Julian Hy M.D.   On: 03/23/2016 17:32    Medications: I have reviewed the patient's current medications.  Assessment/Plan: 1. 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  The tumor returned microsatellite stable with no loss of mismatch repair protein expression   APC mutated. No BRAF, KRAS, or NRAS mutation On Foundation 1 testing   Cycle 1 adjuvant FOLFOX 09/08/2013   Cycle 2 adjuvant FOLFOX 09/24/2013   Cycle 3 adjuvant FOLFOX 10/08/2013.   Cycle 4 adjuvant FOLFOX 10/22/2013.   Cycle 5 adjuvant FOLFOX 11/05/2013. Oxaliplatin held due to thrombocytopenia.  Cycle 6 FOLFOX 11/19/2013.  Cycle 7 FOLFOX 12/03/2013. Oxaliplatin held secondary to thrombocytopenia.  Cycle 8 FOLFOX 12/17/2013.  Cycle 9 FOLFOX 01/04/2014. Oxaliplatin held secondary to neutropenia.   Cycle 10 FOLFOX 01/21/2014. Oxaliplatin held secondary to thrombocytopenia.  Cycle 11 FOLFOX 02/04/2014  Cycle 12 FOLFOX 02/18/2014, oxaliplatin dose reduced secondary to thrombocytopenia  CT abdomen/pelvis 01/30/2014 revealed splenomegaly and no evidence of recurrent colon cancer  CT chest 04/07/2014 with a stable right lower lobe nodule and no evidence for metastatic disease, no nodules seen on the CT 11/26/2014  Markedly elevated CEA 11/24/2014  CT 11/26/2014 revealed a right pelvic mass, splenomegaly, small volume ascites  Right salpingo-oophorectomy 12/28/2014 with the pathology confirming metastatic colon cancer 2. Mild elevation of the CEA beginning January 2016, normal on 05/19/2014  3. History of iron deficiency anemia  4. seizure disorder  5. history of depression  6. 4 mm right lower lobe nodule on a staging chest CT 09/08/2013 , stable on a CT 04/07/2014 7. Hospitalization 09/24/2013 through 09/26/2013 with fever and abdominal pain.  8. 09/24/2013 urine culture positive for coag negative staph.  9. 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 Splenomegaly noted on a CT scan 01/30/2014, the spleen is not palpable  today    Disposition:  Ms. Durene Romans remains in clinical remission from colon cancer. He will return for an office visit and CEA in 4 months.  15 minutes were spent with the patient today. The majority of the time was used for counseling and coordination of care.  Betsy Coder, MD  03/26/2016  1:11 PM

## 2016-03-29 ENCOUNTER — Telehealth: Payer: Self-pay | Admitting: Nurse Practitioner

## 2016-03-29 NOTE — Telephone Encounter (Signed)
Spoke to patient and confirmed June appointment

## 2016-05-09 ENCOUNTER — Encounter (HOSPITAL_COMMUNITY): Payer: Self-pay

## 2016-05-28 ENCOUNTER — Other Ambulatory Visit: Payer: Self-pay

## 2016-05-28 DIAGNOSIS — E8940 Asymptomatic postprocedural ovarian failure: Secondary | ICD-10-CM

## 2016-05-28 MED ORDER — ESTRADIOL 0.5 MG PO TABS: 0.5000 mg | ORAL_TABLET | Freq: Every day | ORAL | 11 refills | Status: DC

## 2016-07-24 ENCOUNTER — Ambulatory Visit (HOSPITAL_BASED_OUTPATIENT_CLINIC_OR_DEPARTMENT_OTHER): Payer: Self-pay | Admitting: Nurse Practitioner

## 2016-07-24 ENCOUNTER — Other Ambulatory Visit (HOSPITAL_BASED_OUTPATIENT_CLINIC_OR_DEPARTMENT_OTHER): Payer: Self-pay

## 2016-07-24 VITALS — BP 124/93 | HR 75 | Temp 98.6°F | Wt 177.5 lb

## 2016-07-24 DIAGNOSIS — C182 Malignant neoplasm of ascending colon: Secondary | ICD-10-CM

## 2016-07-24 LAB — CEA (IN HOUSE-CHCC): CEA (CHCC-In House): <1 ng/mL (ref 0.00–5.00)

## 2016-07-24 NOTE — Progress Notes (Signed)
  [place] OFFICE PROGRESS NOTE   Diagnosis:  Colon cancer  INTERVAL HISTORY:   [name] returns as scheduled. She feels well. No change in bowel habits. No abdominal pain. No nausea or vomiting. She has a good appetite.  Objective:  Vital signs in last 24 hours:  Blood pressure (!) 124/93, pulse 75, temperature 98.6 F (37 C), weight 177 lb 8 oz (80.5 kg), SpO2 99 %.    HEENT: Neck without mass. Lymphatics: No palpable cervical, supraclavicular, axillary or inguinal lymph nodes. Resp: Lungs clear bilaterally. Cardio: Regular rate and rhythm. GI: Abdomen soft and nontender. No hepatomegaly. No mass. Vascular: No leg edema.   Lab Results:  Lab Results  Component Value Date   WBC 6.4 12/29/2014   HGB 10.9 (L) 12/29/2014   HCT 32.8 (L) 12/29/2014   MCV 82.2 12/29/2014   PLT 131 (L) 12/29/2014   NEUTROABS 3.1 12/20/2014    Imaging:  No results found.  Medications: I have reviewed the patient's current medications.  Assessment/Plan: 1. 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  The tumor returned microsatellite stable with no loss of mismatch repair protein expression   APC mutated. No BRAF, KRAS, or NRAS mutation On Foundation 1 testing   Cycle 1 adjuvant FOLFOX 09/08/2013   Cycle 2 adjuvant FOLFOX 09/24/2013   Cycle 3 adjuvant FOLFOX 10/08/2013.   Cycle 4 adjuvant FOLFOX 10/22/2013.   Cycle 5 adjuvant FOLFOX 11/05/2013. Oxaliplatin held due to thrombocytopenia.  Cycle 6 FOLFOX 11/19/2013.  Cycle 7 FOLFOX 12/03/2013. Oxaliplatin held secondary to thrombocytopenia.  Cycle 8 FOLFOX 12/17/2013.  Cycle 9 FOLFOX 01/04/2014. Oxaliplatin held secondary to neutropenia.   Cycle 10 FOLFOX 01/21/2014. Oxaliplatin held secondary to thrombocytopenia.  Cycle 11 FOLFOX 02/04/2014  Cycle 12 FOLFOX 02/18/2014, oxaliplatin dose reduced  secondary to thrombocytopenia  CT abdomen/pelvis 01/30/2014 revealed splenomegaly and no evidence of recurrent colon cancer  CT chest 04/07/2014 with a stable right lower lobe nodule and no evidence for metastatic disease, no nodules seen on the CT 11/26/2014  Markedly elevated CEA 11/24/2014  CT 11/26/2014 revealed a right pelvic mass, splenomegaly, small volume ascites  Right salpingo-oophorectomy 12/28/2014 with the pathology confirming metastatic colon cancer  CTs 03/23/2016-no evidence of recurrent or metastatic disease. 2. Mild elevation of the CEA beginning January 2016, normal on 05/19/2014  3. History of iron deficiency anemia  4. seizure disorder  5. history of depression  6. 4 mm right lower lobe nodule on a staging chest CT 09/08/2013 , stable on a CT 04/07/2014 7. Hospitalization 09/24/2013 through 09/26/2013 with fever and abdominal pain.  8. 09/24/2013 urine culture positive for coag negative staph.  9. 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 Splenomegaly noted on a CT scan 01/30/2014, the spleen is not palpable today   Disposition: [name] remains in clinical remission from colon cancer. We will follow-up on the CEA from today. She will return for a follow-up visit and CT scans in approximately 4 months. She will contact the office in the interim with any problems.  Plan reviewed with Dr. Benay Spice.    Ned Card ANP/GNP-BC   07/24/2016  2:24 PM

## 2016-07-27 ENCOUNTER — Telehealth: Payer: Self-pay | Admitting: *Deleted

## 2016-07-27 NOTE — Telephone Encounter (Signed)
Per Lattie Haw, NP notified patient her CEA is normal. Patient verbalized understanding.

## 2016-07-27 NOTE — Telephone Encounter (Signed)
Message received from patient requesting a call back.  Patient states that she has already received a call back from [place] and is appreciative of call.

## 2016-07-27 NOTE — Telephone Encounter (Signed)
-----   Message from Owens Shark, NP sent at 07/27/2016 12:52 PM EDT ----- Please let her know CEA is normal.

## 2016-07-30 ENCOUNTER — Telehealth: Payer: Self-pay | Admitting: *Deleted

## 2016-07-30 ENCOUNTER — Telehealth: Payer: Self-pay | Admitting: Oncology

## 2016-07-30 NOTE — Telephone Encounter (Signed)
-----   Message from Shari Heritage, LPN sent at 3/94/3200  4:21 PM EDT -----   ----- Message ----- From: Owens Shark, NP Sent: 07/27/2016  12:52 PM To: Shari Heritage, LPN  Please let her know CEA is normal.

## 2016-07-30 NOTE — Telephone Encounter (Signed)
Appointments scheduled and confirmed with patient, per 07/24/16 los.

## 2016-07-30 NOTE — Telephone Encounter (Signed)
Telephone call to patient to advise CEA normal. Patient verbalized an understanding.

## 2016-09-10 ENCOUNTER — Telehealth: Payer: Self-pay

## 2016-09-10 MED ORDER — CIPROFLOXACIN HCL 500 MG PO TABS: 500.0000 mg | ORAL_TABLET | Freq: Two times a day (BID) | ORAL | 0 refills | Status: DC

## 2016-09-10 NOTE — Telephone Encounter (Signed)
lvm for clarification of the pain, how long she has had it, any fevers, any urinary sx besides abdominal and back pain, does she have a PCP.  419pm pt left voice message that she had frequency of urination on Saturday. On Sunday she had dull pain in stomach in area of ovary (ovary gone) and at the bottom of her back. A dull ache. The ache would increase when bladder full and during urination. There was no urethral pain with urination.  She has no PCP  S/w Dr Benay Spice and then pt. Called in cipro bid for 5 days. Instructed her to call if this does not alleviate symptoms.

## 2016-09-10 NOTE — Telephone Encounter (Signed)
lvm pt can see PCP or call [place] when get off work.

## 2016-09-10 NOTE — Telephone Encounter (Signed)
Pt called with R abdominal pain and R back pain. She suspects a UTI. She does not get off work until 3 pm.

## 2016-09-25 ENCOUNTER — Telehealth: Payer: Self-pay | Admitting: Oncology

## 2016-09-25 NOTE — Telephone Encounter (Signed)
Call day moved from 10/23-10/25 left message for patient

## 2016-11-23 ENCOUNTER — Other Ambulatory Visit: Payer: Self-pay

## 2016-11-23 ENCOUNTER — Ambulatory Visit (HOSPITAL_COMMUNITY): Payer: Self-pay

## 2016-11-26 ENCOUNTER — Other Ambulatory Visit (HOSPITAL_BASED_OUTPATIENT_CLINIC_OR_DEPARTMENT_OTHER): Payer: Self-pay

## 2016-11-26 ENCOUNTER — Encounter (HOSPITAL_COMMUNITY): Payer: Self-pay

## 2016-11-26 ENCOUNTER — Ambulatory Visit (HOSPITAL_COMMUNITY)
Admission: RE | Admit: 2016-11-26 | Discharge: 2016-11-26 | Disposition: A | Discharge status: Home / Self Care | Payer: BLUE CROSS/BLUE SHIELD | Source: Ambulatory Visit | Attending: Nurse Practitioner | Admitting: Nurse Practitioner

## 2016-11-26 DIAGNOSIS — C182 Malignant neoplasm of ascending colon: Secondary | ICD-10-CM

## 2016-11-26 DIAGNOSIS — R935 Abnormal findings on diagnostic imaging of other abdominal regions, including retroperitoneum: Secondary | ICD-10-CM | POA: Insufficient documentation

## 2016-11-26 DIAGNOSIS — R161 Splenomegaly, not elsewhere classified: Secondary | ICD-10-CM | POA: Insufficient documentation

## 2016-11-26 HISTORY — DX: Secondary malignant neoplasm of right ovary: C79.61

## 2016-11-26 LAB — COMPREHENSIVE METABOLIC PANEL
ALT: 39 U/L (ref 0–55)
ANION GAP: 8 meq/L (ref 3–11)
AST: 31 U/L (ref 5–34)
Albumin: 3.9 g/dL (ref 3.5–5.0)
Alkaline Phosphatase: 64 U/L (ref 40–150)
BUN: 12.2 mg/dL (ref 7.0–26.0)
CALCIUM: 9.3 mg/dL (ref 8.4–10.4)
CHLORIDE: 104 meq/L (ref 98–109)
CO2: 26 meq/L (ref 22–29)
CREATININE: 0.8 mg/dL (ref 0.6–1.1)
Glucose: 92 mg/dl (ref 70–140)
Potassium: 4.2 mEq/L (ref 3.5–5.1)
Sodium: 138 mEq/L (ref 136–145)
TOTAL PROTEIN: 7.2 g/dL (ref 6.4–8.3)
Total Bilirubin: 1.13 mg/dL (ref 0.20–1.20)

## 2016-11-26 LAB — CEA (IN HOUSE-CHCC): CEA (CHCC-In House): <1 ng/mL (ref 0.00–5.00)

## 2016-11-26 MED ORDER — IOPAMIDOL (ISOVUE-300) INJECTION 61%
INTRAVENOUS | Status: AC
  Filled 2016-11-26: qty 100

## 2016-11-26 MED ORDER — IOPAMIDOL (ISOVUE-300) INJECTION 61%
100.0000 mL | Freq: Once | INTRAVENOUS | Status: AC | PRN
  Administered 2016-11-26: 100 mL via INTRAVENOUS

## 2016-11-27 ENCOUNTER — Ambulatory Visit: Payer: Self-pay | Admitting: Oncology

## 2016-11-29 ENCOUNTER — Telehealth: Payer: Self-pay | Admitting: *Deleted

## 2016-11-29 ENCOUNTER — Ambulatory Visit (HOSPITAL_BASED_OUTPATIENT_CLINIC_OR_DEPARTMENT_OTHER): Payer: Self-pay | Admitting: Oncology

## 2016-11-29 VITALS — BP 119/71 | HR 57 | Temp 98.0°F | Resp 20 | Ht 66.0 in | Wt 174.0 lb

## 2016-11-29 DIAGNOSIS — C182 Malignant neoplasm of ascending colon: Secondary | ICD-10-CM

## 2016-11-29 DIAGNOSIS — G620 Drug-induced polyneuropathy: Secondary | ICD-10-CM

## 2016-11-29 DIAGNOSIS — D696 Thrombocytopenia, unspecified: Secondary | ICD-10-CM

## 2016-11-29 NOTE — Telephone Encounter (Signed)
Called pt, informed her that Dr. Benay Spice reviewed scans from 2017 and the fluid density was larger then. MD will present case in conference and call with any new recommendations. Pt voiced understanding.

## 2016-11-29 NOTE — Progress Notes (Signed)
[place] OFFICE PROGRESS NOTE   Diagnosis: Colon cancer  INTERVAL HISTORY:   [name] returns as scheduled. She had nausea after eating 2 weeks ago. This lasted 4 days and was followed by diarrhea. These symptoms have resolved. She feels well. She is working.  Objective:  Vital signs in last 24 hours:  Blood pressure 119/71, pulse (!) 57, temperature 98 F (36.7 C), temperature source Oral, resp. rate 20, height _0  (1.676 m), weight 174 lb (78.9 kg), SpO2 100 %.    HEENT: Neck without mass Lymphatics: No cervical, supraclavicular, axillary, or inguinal nodes Resp: Lungs clear bilaterally Cardio: Regular rate and rhythm GI: No hepatosplenomegaly, no mass, mild tenderness in the right lower abdomen Vascular: No leg edema   Lab Results:    Lab Results  Component Value Date   CEA1 <1.00 11/26/2016    Imaging:  Ct Chest W Contrast  Result Date: 11/26/2016 CLINICAL DATA:  Colon cancer with ovarian metastatic disease. EXAM: CT CHEST, ABDOMEN, AND PELVIS WITH CONTRAST TECHNIQUE: Multidetector CT imaging of the chest, abdomen and pelvis was performed following the standard protocol during bolus administration of intravenous contrast. CONTRAST:  162m ISOVUE-300 IOPAMIDOL (ISOVUE-300) INJECTION 61% COMPARISON:  03/23/2016. FINDINGS: CT CHEST FINDINGS Cardiovascular: Vascular structures are unremarkable. Heart size normal. No pericardial effusion. Mediastinum/Nodes: No pathologically enlarged mediastinal, hilar or axillary lymph nodes. Esophagus is grossly unremarkable. Lungs/Pleura: Minimal subpleural scarring in the right middle lobe. Lungs are otherwise clear. Airway is unremarkable. Musculoskeletal: No worrisome lytic or sclerotic lesions. CT ABDOMEN PELVIS FINDINGS Hepatobiliary: Liver and gallbladder are unremarkable. No biliary ductal dilatation. Pancreas: Negative. Spleen: Measures 13.6 cm, otherwise negative. Adrenals/Urinary Tract: Adrenal glands and  kidneys are unremarkable. Ureters are decompressed. Bladder is low in volume. Stomach/Bowel: Stomach and small bowel are unremarkable. Right hemicolectomy. Stool is seen throughout the remaining colon. Vascular/Lymphatic: No pathologically enlarged lymph nodes. Reproductive: Hysterectomy. Bilateral salpingo oophorectomy. Bilobed fluid density structure along the right pelvic sidewall has enlarged, measuring 2.0 x 2.2 cm (series 2, image 108), previously 0.9 x 1.8 cm. Other: No free fluid. Mesenteries and peritoneum are otherwise unremarkable. Musculoskeletal: No worrisome lytic or sclerotic lesions. Mild degenerative changes in the spine. IMPRESSION: 1. Enlargement of a bilobed fluid density structure along the right pelvic sidewall. Metastatic disease cannot be excluded. 2. Mild splenomegaly. Electronically Signed   By: MLorin PicketM.D.   On: 11/26/2016 09:29   Ct Abdomen Pelvis W Contrast  Result Date: 11/26/2016 CLINICAL DATA:  Colon cancer with ovarian metastatic disease. EXAM: CT CHEST, ABDOMEN, AND PELVIS WITH CONTRAST TECHNIQUE: Multidetector CT imaging of the chest, abdomen and pelvis was performed following the standard protocol during bolus administration of intravenous contrast. CONTRAST:  1060mISOVUE-300 IOPAMIDOL (ISOVUE-300) INJECTION 61% COMPARISON:  03/23/2016. FINDINGS: CT CHEST FINDINGS Cardiovascular: Vascular structures are unremarkable. Heart size normal. No pericardial effusion. Mediastinum/Nodes: No pathologically enlarged mediastinal, hilar or axillary lymph nodes. Esophagus is grossly unremarkable. Lungs/Pleura: Minimal subpleural scarring in the right middle lobe. Lungs are otherwise clear. Airway is unremarkable. Musculoskeletal: No worrisome lytic or sclerotic lesions. CT ABDOMEN PELVIS FINDINGS Hepatobiliary: Liver and gallbladder are unremarkable. No biliary ductal dilatation. Pancreas: Negative. Spleen: Measures 13.6 cm, otherwise negative. Adrenals/Urinary Tract: Adrenal  glands and kidneys are unremarkable. Ureters are decompressed. Bladder is low in volume. Stomach/Bowel: Stomach and small bowel are unremarkable. Right hemicolectomy. Stool is seen throughout the remaining colon. Vascular/Lymphatic: No pathologically enlarged lymph nodes. Reproductive: Hysterectomy. Bilateral salpingo oophorectomy. Bilobed fluid density structure along the right pelvic sidewall  has enlarged, measuring 2.0 x 2.2 cm (series 2, image 108), previously 0.9 x 1.8 cm. Other: No free fluid. Mesenteries and peritoneum are otherwise unremarkable. Musculoskeletal: No worrisome lytic or sclerotic lesions. Mild degenerative changes in the spine. IMPRESSION: 1. Enlargement of a bilobed fluid density structure along the right pelvic sidewall. Metastatic disease cannot be excluded. 2. Mild splenomegaly. Electronically Signed   By: Lorin Picket M.D.   On: 11/26/2016 09:29    Medications: I have reviewed the patient's current medications.  Assessment/Plan: 1. 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  The tumor returned microsatellite stable with no loss of mismatch repair protein expression   APC mutated. No BRAF, KRAS, or NRAS mutation On Foundation 1 testing   Cycle 1 adjuvant FOLFOX 09/08/2013   Cycle 2 adjuvant FOLFOX 09/24/2013   Cycle 3 adjuvant FOLFOX 10/08/2013.   Cycle 4 adjuvant FOLFOX 10/22/2013.   Cycle 5 adjuvant FOLFOX 11/05/2013. Oxaliplatin held due to thrombocytopenia.  Cycle 6 FOLFOX 11/19/2013.  Cycle 7 FOLFOX 12/03/2013. Oxaliplatin held secondary to thrombocytopenia.  Cycle 8 FOLFOX 12/17/2013.  Cycle 9 FOLFOX 01/04/2014. Oxaliplatin held secondary to neutropenia.   Cycle 10 FOLFOX 01/21/2014. Oxaliplatin held secondary to thrombocytopenia.  Cycle 11 FOLFOX 02/04/2014  Cycle 12 FOLFOX 02/18/2014, oxaliplatin dose reduced secondary to  thrombocytopenia  CT abdomen/pelvis 01/30/2014 revealed splenomegaly and no evidence of recurrent colon cancer  CT chest 04/07/2014 with a stable right lower lobe nodule and no evidence for metastatic disease, no nodules seen on the CT 11/26/2014  Markedly elevated CEA 11/24/2014  CT 11/26/2014 revealed a right pelvic mass, splenomegaly, small volume ascites  Right salpingo-oophorectomy 12/28/2014 with the pathology confirming metastatic colon cancer  CTs 03/23/2016-no evidence of recurrent or metastatic disease.  CT 11/26/2016-enlargement of a fluid density structure the right pelvic sidewall, no other evidence of metastatic disease 2. Mild elevation of the CEA beginning January 2016, normal on 05/19/2014  3. History of iron deficiency anemia  4. seizure disorder  5. history of depression  6. 4 mm right lower lobe nodule on a staging chest CT 09/08/2013 , stable on a CT 04/07/2014 7. Hospitalization 09/24/2013 through 09/26/2013 with fever and abdominal pain.  8. 09/24/2013 urine culture positive for coag negative staph.  9. 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 Splenomegaly noted on a CT scan 01/30/2014, the spleen is not palpable today   Disposition: [name] remains in clinical remission from colon cancer. She appears well and the CEA is normal. The fluid density structure at the right pelvic sidewall is most likely benign, but could represent an indolent pelvic implant of tumor. I will present her case at the GI tumor conference next week.  We will decide on additional imaging based on the GI conference discussion. She will return for an office visit in 3 months.  25 minutes were spent with the patient today. The majority of the time was used for counseling and coordination of care.    Donneta Romberg, MD  11/29/2016  8:12 AM

## 2016-11-30 ENCOUNTER — Telehealth: Payer: Self-pay | Admitting: Oncology

## 2016-11-30 NOTE — Telephone Encounter (Signed)
Scheduled appt per 10/25 los - reminder letter sent in the mail - lab and f/u in 3 months.

## 2016-12-07 ENCOUNTER — Other Ambulatory Visit: Payer: Self-pay | Admitting: Nurse Practitioner

## 2016-12-07 ENCOUNTER — Telehealth: Payer: Self-pay | Admitting: Nurse Practitioner

## 2016-12-07 DIAGNOSIS — C182 Malignant neoplasm of ascending colon: Secondary | ICD-10-CM

## 2016-12-07 NOTE — Telephone Encounter (Signed)
I talked with Ms. Tuttle regarding Dr. Gearldine Shown recommendation to proceed with a biopsy of the fluid density structure right pelvic sidewall. She is in agreement. Order placed.

## 2016-12-17 ENCOUNTER — Other Ambulatory Visit: Payer: Self-pay | Admitting: Student

## 2016-12-18 ENCOUNTER — Other Ambulatory Visit: Payer: Self-pay | Admitting: Radiology

## 2016-12-18 ENCOUNTER — Other Ambulatory Visit: Payer: Self-pay | Admitting: General Surgery

## 2016-12-19 ENCOUNTER — Ambulatory Visit (HOSPITAL_COMMUNITY)
Admission: RE | Admit: 2016-12-19 | Discharge: 2016-12-19 | Disposition: A | Discharge status: Home / Self Care | Payer: Medicaid Other | Source: Ambulatory Visit | Attending: Nurse Practitioner | Admitting: Nurse Practitioner

## 2016-12-19 ENCOUNTER — Encounter (HOSPITAL_COMMUNITY): Payer: Self-pay

## 2016-12-19 DIAGNOSIS — Z8249 Family history of ischemic heart disease and other diseases of the circulatory system: Secondary | ICD-10-CM | POA: Insufficient documentation

## 2016-12-19 DIAGNOSIS — Z882 Allergy status to sulfonamides status: Secondary | ICD-10-CM | POA: Insufficient documentation

## 2016-12-19 DIAGNOSIS — Z823 Family history of stroke: Secondary | ICD-10-CM | POA: Insufficient documentation

## 2016-12-19 DIAGNOSIS — Z833 Family history of diabetes mellitus: Secondary | ICD-10-CM | POA: Insufficient documentation

## 2016-12-19 DIAGNOSIS — Z8543 Personal history of malignant neoplasm of ovary: Secondary | ICD-10-CM | POA: Insufficient documentation

## 2016-12-19 DIAGNOSIS — Z90721 Acquired absence of ovaries, unilateral: Secondary | ICD-10-CM | POA: Insufficient documentation

## 2016-12-19 DIAGNOSIS — Z8371 Family history of colonic polyps: Secondary | ICD-10-CM | POA: Insufficient documentation

## 2016-12-19 DIAGNOSIS — Z91012 Allergy to eggs: Secondary | ICD-10-CM | POA: Insufficient documentation

## 2016-12-19 DIAGNOSIS — G2581 Restless legs syndrome: Secondary | ICD-10-CM | POA: Insufficient documentation

## 2016-12-19 DIAGNOSIS — K219 Gastro-esophageal reflux disease without esophagitis: Secondary | ICD-10-CM | POA: Insufficient documentation

## 2016-12-19 DIAGNOSIS — Z885 Allergy status to narcotic agent status: Secondary | ICD-10-CM | POA: Insufficient documentation

## 2016-12-19 DIAGNOSIS — R1900 Intra-abdominal and pelvic swelling, mass and lump, unspecified site: Secondary | ICD-10-CM | POA: Insufficient documentation

## 2016-12-19 DIAGNOSIS — Z9049 Acquired absence of other specified parts of digestive tract: Secondary | ICD-10-CM | POA: Insufficient documentation

## 2016-12-19 DIAGNOSIS — Z880 Allergy status to penicillin: Secondary | ICD-10-CM | POA: Insufficient documentation

## 2016-12-19 DIAGNOSIS — C182 Malignant neoplasm of ascending colon: Secondary | ICD-10-CM

## 2016-12-19 DIAGNOSIS — Z9221 Personal history of antineoplastic chemotherapy: Secondary | ICD-10-CM | POA: Insufficient documentation

## 2016-12-19 LAB — CBC
HCT: 38.7 % (ref 36.0–46.0)
HEMOGLOBIN: 13.3 g/dL (ref 12.0–15.0)
MCH: 28.4 pg (ref 26.0–34.0)
MCHC: 34.4 g/dL (ref 30.0–36.0)
MCV: 82.5 fL (ref 78.0–100.0)
Platelets: 169 10*3/uL (ref 150–400)
RBC: 4.69 MIL/uL (ref 3.87–5.11)
RDW: 14 % (ref 11.5–15.5)
WBC: 4.6 10*3/uL (ref 4.0–10.5)

## 2016-12-19 LAB — APTT: aPTT: 31 seconds (ref 24–36)

## 2016-12-19 LAB — PROTIME-INR
INR: 1.02
PROTHROMBIN TIME: 13.3 s (ref 11.4–15.2)

## 2016-12-19 MED ORDER — FENTANYL CITRATE (PF) 100 MCG/2ML IJ SOLN
INTRAMUSCULAR | Status: AC
  Filled 2016-12-19: qty 2

## 2016-12-19 MED ORDER — SODIUM CHLORIDE 0.9 % IV SOLN
INTRAVENOUS | Status: DC
  Administered 2016-12-19: 08:00:00 via INTRAVENOUS

## 2016-12-19 MED ORDER — FENTANYL CITRATE (PF) 100 MCG/2ML IJ SOLN
INTRAMUSCULAR | Status: AC | PRN
  Administered 2016-12-19 (×2): 50 ug via INTRAVENOUS

## 2016-12-19 MED ORDER — MIDAZOLAM HCL 2 MG/2ML IJ SOLN
INTRAMUSCULAR | Status: AC | PRN
  Administered 2016-12-19 (×2): 1 mg via INTRAVENOUS

## 2016-12-19 MED ORDER — MIDAZOLAM HCL 2 MG/2ML IJ SOLN
INTRAMUSCULAR | Status: AC
  Filled 2016-12-19: qty 4

## 2016-12-19 NOTE — Procedures (Signed)
R pelvic aspiration Two drops bloody fluid EBL 0 Comp 0

## 2016-12-19 NOTE — Discharge Instructions (Signed)

## 2016-12-19 NOTE — Consult Note (Signed)
Chief Complaint: Patient was seen in consultation today for CT-guided aspiration of pelvic fluid collection  Referring Physician(s): Sherrill,B  Supervising Physician: Marybelle Killings  Patient Status: [facility] - Out-pt  History of Present Illness: [name] is a 41 y.o. female with history of colon cancer, status post right colectomy in 2015/chemotherapy/right salpingo-oophorectomy 2016 with metastatic disease.  Recent imaging study has revealed enlargement of a bilobed fluid density structure along the right pelvic sidewall, metastatic disease not excluded. She has normal CEA and presents today for CT-guided aspiration of the right pelvic sidewall fluid density structure for further evaluation.  Past Medical History:  Diagnosis Date  . ADD (attention deficit disorder)   . Anemia, iron deficiency   . Anxiety   . Colon cancer ([place]) 08/2013  . Depression   . Difficulty swallowing pills   . GERD (gastroesophageal reflux disease)   . Headache    migraines "long time ago"  . History of blood transfusion 08/16/2013  . History of migraine    no problems in "a long time"  . 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 Restless leg syndrome   . Seizures ([place])    last seizure 08/2012    Past Surgical History:  Procedure Laterality Date  . ABDOMINAL HYSTERECTOMY  08/24/2004   partial  . APPENDECTOMY  08/24/2004  . COLON SURGERY  08/11/2013   removed a foot of colon  . colonoscopy 10-18-14    . COLONOSCOPY WITH PROPOFOL  07/07/2013  . CYSTOSCOPY  11/01/2003  . ENDOMETRIAL FULGURATION  11/01/2003  . ESOPHAGOGASTRODUODENOSCOPY  07/07/2013  . LAPAROSCOPIC LYSIS OF ADHESIONS  11/01/2003  . LEFT OOPHORECTOMY  08/24/2004  . PANNICULECTOMY  08/24/2004  . TUBAL LIGATION  11/07/2000  . UNILATERAL SALPINGECTOMY Left 08/24/2004    Allergies: Adhesive [tape]; Eggs or egg-derived products; Metoclopramide hcl; Oxycodone-acetaminophen; Sulfa antibiotics; and  Penicillins  Medications: Prior to Admission medications   Medication Sig Start Date End Date Taking? Authorizing Provider  estradiol (ESTRACE) 0.5 MG tablet Take 1 tablet (0.5 mg total) by mouth daily. 05/28/16  Yes Everitt Amber, MD  Multiple Vitamins-Minerals (MULTIVITAMIN WITH MINERALS) tablet Take 1 tablet by mouth daily.   Yes [provider]  LORazepam (ATIVAN) 1 MG tablet Take 1 tablet (1 mg total) by mouth 2 (two) times daily as needed for anxiety or sleep. 06/30/15   [name], MD  ondansetron (ZOFRAN) 8 MG tablet TAKE ONE TABLET BY MOUTH EVERY 8 HOURS AS NEEDED FOR NAUSEA OR VOMITING. Patient not taking: Reported on 11/29/2016 08/16/14   [name], MD     Family History  Problem Relation Age of Onset  . Heart attack Mother   . Colon polyps Mother        3+ colon polyps at each colonoscopy - "several"  . Colitis Mother   . Diabetes Father   . Heart attack Father   . Stroke Father   . Congestive Heart Failure Father   . Cirrhosis Maternal Aunt   . Lung cancer Maternal Uncle        d. 70s; smoker and worked around asbestos  . Other Paternal Grandfather        "bone cancer"  . Colon cancer Maternal Uncle        dx early 20s  . Cirrhosis Maternal Aunt        d. 63s  . Breast cancer Maternal Aunt        dx 50s-60s; s/p lumpectomy  . Dementia Maternal Aunt   .  Other Cousin        maternal 1st cousin w/ "lung issues"  . Dementia Paternal Aunt   . Dementia Paternal Uncle   . Prostate cancer Paternal Uncle 70  . Esophageal cancer Neg Hx   . Rectal cancer Neg Hx   . Stomach cancer Neg Hx     Social History   Socioeconomic History  . Marital status: Single    Spouse name: None  . Number of children: 2  . Years of education: None  . Highest education level: None  Social Needs  . Financial resource strain: None  . Food insecurity - worry: None  . Food insecurity - inability: None  . Transportation needs - medical: None  . Transportation needs -  non-medical: None  Occupational History  . Occupation: Unemployed- full time student    Comment: Completed 12th grade  Tobacco Use  . Smoking status: Never Smoker  . Smokeless tobacco: Never Used  Substance and Sexual Activity  . Alcohol use: No  . Drug use: No  . Sexual activity: Yes    Birth control/protection: Surgical  Other Topics Concern  . None  Social History Narrative  . None      Review of Systems she currently denies fever, headache, chest pain, dyspnea, cough, abdominal/back pain, nausea, vomiting or bleeding.  Vital Signs: BP 128/86   Pulse 62   Temp 97.6 F (36.4 C) (Oral)   Resp 20   Ht 5\' 6"  (1.676 m)   Wt 174 lb (78.9 kg)   SpO2 100%   BMI 28.08 kg/m   Physical Exam awake, alert.  Chest clear to auscultation bilaterally.  Heart with regular rate and rhythm.  Abdomen soft, positive bowel sounds, nontender.  No lower extremity edema.  Imaging: Ct Chest W Contrast  Result Date: 11/26/2016 CLINICAL DATA:  Colon cancer with ovarian metastatic disease. EXAM: CT CHEST, ABDOMEN, AND PELVIS WITH CONTRAST TECHNIQUE: Multidetector CT imaging of the chest, abdomen and pelvis was performed following the standard protocol during bolus administration of intravenous contrast. CONTRAST:  165mL ISOVUE-300 IOPAMIDOL (ISOVUE-300) INJECTION 61% COMPARISON:  03/23/2016. FINDINGS: CT CHEST FINDINGS Cardiovascular: Vascular structures are unremarkable. Heart size normal. No pericardial effusion. Mediastinum/Nodes: No pathologically enlarged mediastinal, hilar or axillary lymph nodes. Esophagus is grossly unremarkable. Lungs/Pleura: Minimal subpleural scarring in the right middle lobe. Lungs are otherwise clear. Airway is unremarkable. Musculoskeletal: No worrisome lytic or sclerotic lesions. CT ABDOMEN PELVIS FINDINGS Hepatobiliary: Liver and gallbladder are unremarkable. No biliary ductal dilatation. Pancreas: Negative. Spleen: Measures 13.6 cm, otherwise negative. Adrenals/Urinary  Tract: Adrenal glands and kidneys are unremarkable. Ureters are decompressed. Bladder is low in volume. Stomach/Bowel: Stomach and small bowel are unremarkable. Right hemicolectomy. Stool is seen throughout the remaining colon. Vascular/Lymphatic: No pathologically enlarged lymph nodes. Reproductive: Hysterectomy. Bilateral salpingo oophorectomy. Bilobed fluid density structure along the right pelvic sidewall has enlarged, measuring 2.0 x 2.2 cm (series 2, image 108), previously 0.9 x 1.8 cm. Other: No free fluid. Mesenteries and peritoneum are otherwise unremarkable. Musculoskeletal: No worrisome lytic or sclerotic lesions. Mild degenerative changes in the spine. IMPRESSION: 1. Enlargement of a bilobed fluid density structure along the right pelvic sidewall. Metastatic disease cannot be excluded. 2. Mild splenomegaly. Electronically Signed   By: Lorin Picket M.D.   On: 11/26/2016 09:29   Ct Abdomen Pelvis W Contrast  Result Date: 11/26/2016 CLINICAL DATA:  Colon cancer with ovarian metastatic disease. EXAM: CT CHEST, ABDOMEN, AND PELVIS WITH CONTRAST TECHNIQUE: Multidetector CT imaging of the chest, abdomen and  pelvis was performed following the standard protocol during bolus administration of intravenous contrast. CONTRAST:  170mL ISOVUE-300 IOPAMIDOL (ISOVUE-300) INJECTION 61% COMPARISON:  03/23/2016. FINDINGS: CT CHEST FINDINGS Cardiovascular: Vascular structures are unremarkable. Heart size normal. No pericardial effusion. Mediastinum/Nodes: No pathologically enlarged mediastinal, hilar or axillary lymph nodes. Esophagus is grossly unremarkable. Lungs/Pleura: Minimal subpleural scarring in the right middle lobe. Lungs are otherwise clear. Airway is unremarkable. Musculoskeletal: No worrisome lytic or sclerotic lesions. CT ABDOMEN PELVIS FINDINGS Hepatobiliary: Liver and gallbladder are unremarkable. No biliary ductal dilatation. Pancreas: Negative. Spleen: Measures 13.6 cm, otherwise negative.  Adrenals/Urinary Tract: Adrenal glands and kidneys are unremarkable. Ureters are decompressed. Bladder is low in volume. Stomach/Bowel: Stomach and small bowel are unremarkable. Right hemicolectomy. Stool is seen throughout the remaining colon. Vascular/Lymphatic: No pathologically enlarged lymph nodes. Reproductive: Hysterectomy. Bilateral salpingo oophorectomy. Bilobed fluid density structure along the right pelvic sidewall has enlarged, measuring 2.0 x 2.2 cm (series 2, image 108), previously 0.9 x 1.8 cm. Other: No free fluid. Mesenteries and peritoneum are otherwise unremarkable. Musculoskeletal: No worrisome lytic or sclerotic lesions. Mild degenerative changes in the spine. IMPRESSION: 1. Enlargement of a bilobed fluid density structure along the right pelvic sidewall. Metastatic disease cannot be excluded. 2. Mild splenomegaly. Electronically Signed   By: Lorin Picket M.D.   On: 11/26/2016 09:29    Labs:  CBC: Recent Labs    12/19/16 0706  WBC 4.6  HGB 13.3  HCT 38.7  PLT 169    COAGS: Recent Labs    12/19/16 0706  INR 1.02  APTT 31    BMP: Recent Labs    03/23/16 1308 11/26/16 0756  NA 141 138  K 4.0 4.2  CO2 27 26  GLUCOSE 147* 92  BUN 11.7 12.2  CALCIUM 10.1 9.3  CREATININE 0.9 0.8    LIVER FUNCTION TESTS: Recent Labs    03/23/16 1308 11/26/16 0756  BILITOT 1.24* 1.13  AST 36* 31  ALT 48 39  ALKPHOS 83 64  PROT 7.3 7.2  ALBUMIN 4.1 3.9    TUMOR MARKERS: No results for input(s): AFPTM, CEA, CA199, CHROMGRNA in the last 8760 hours.  Assessment and Plan: 41 y.o. female with history of colon cancer, status post right colectomy in 2015/chemotherapy/right salpingo-oophorectomy 2016 with metastatic disease.  Recent imaging study has revealed enlargement of a bilobed fluid density structure along the right pelvic sidewall, metastatic disease not excluded. She has normal CEA and presents today for CT-guided aspiration of the right pelvic sidewall fluid  density structure for further evaluation.Risks and benefits discussed with the patient/sig other including, but not limited to bleeding, infection, damage to adjacent structures or low yield requiring additional tests.All of the patient's questions were answered, patient is agreeable to proceed.Consent signed and in chart.     Thank you for this interesting consult.  I greatly enjoyed meeting KATALAYA BEEL and look forward to participating in their care.  A copy of this report was sent to the requesting provider on this date.  Electronically Signed: D. Rowe Robert, PA-C 12/19/2016, 9:11 AM   I spent a total of  25 minutes   in face to face in clinical consultation, greater than 50% of which was counseling/coordinating care for CT-guided aspiration of right pelvic fluid collection

## 2016-12-21 ENCOUNTER — Telehealth: Payer: Self-pay | Admitting: *Deleted

## 2016-12-21 NOTE — Telephone Encounter (Signed)
Patient notified of cytology result, per MD note below.

## 2016-12-21 NOTE — Telephone Encounter (Signed)
-----   Message from [name], MD sent at 12/20/2016  8:38 PM EST ----- Please call patient, cytology from pelvic cyst is negative for cancer, f/u as scheduled

## 2017-02-05 HISTORY — PX: COLONOSCOPY: SHX174

## 2017-03-01 ENCOUNTER — Ambulatory Visit: Payer: Self-pay | Admitting: Oncology

## 2017-03-01 ENCOUNTER — Other Ambulatory Visit: Payer: Self-pay

## 2017-03-04 ENCOUNTER — Ambulatory Visit: Payer: Self-pay | Admitting: Nurse Practitioner

## 2017-03-04 ENCOUNTER — Other Ambulatory Visit: Payer: Self-pay

## 2017-03-04 ENCOUNTER — Telehealth: Payer: Self-pay | Admitting: Oncology

## 2017-03-04 NOTE — Telephone Encounter (Signed)
Left message for patient regarding voicemail. Patients appointment is rescheduled as requested to 2/1.

## 2017-03-08 ENCOUNTER — Inpatient Hospital Stay: Payer: BLUE CROSS/BLUE SHIELD | Attending: Oncology

## 2017-03-08 ENCOUNTER — Encounter: Payer: Self-pay | Admitting: Nurse Practitioner

## 2017-03-08 ENCOUNTER — Telehealth: Payer: Self-pay | Admitting: Nurse Practitioner

## 2017-03-08 ENCOUNTER — Inpatient Hospital Stay (HOSPITAL_BASED_OUTPATIENT_CLINIC_OR_DEPARTMENT_OTHER): Payer: BLUE CROSS/BLUE SHIELD | Admitting: Nurse Practitioner

## 2017-03-08 VITALS — BP 132/78 | HR 72 | Temp 98.4°F | Resp 17 | Ht 66.0 in | Wt 173.0 lb

## 2017-03-08 DIAGNOSIS — C182 Malignant neoplasm of ascending colon: Secondary | ICD-10-CM | POA: Insufficient documentation

## 2017-03-08 LAB — CEA (IN HOUSE-CHCC): CEA (CHCC-In House): <1 ng/mL (ref 0.00–5.00)

## 2017-03-08 NOTE — Progress Notes (Signed)
[place] OFFICE PROGRESS NOTE   Diagnosis: Colon cancer  INTERVAL HISTORY:   [name] returns for follow-up.  No change in bowel habits.  No bleeding.  She denies pain.  No nausea.  She has a good appetite.  Objective:  Vital signs in last 24 hours:  Blood pressure 132/78, pulse 72, temperature 98.4 F (36.9 C), temperature source Oral, resp. rate 17, height 5' 6" (1.676 m), weight 173 lb (78.5 kg), SpO2 100 %.    HEENT: Neck without mass. Lymphatics: No palpable cervical, supraclavicular, axillary or inguinal lymph nodes. Resp: Lungs clear bilaterally. Cardio: Regular rate and rhythm. GI: Abdomen soft and nontender.  No hepatosplenomegaly.  No mass. Vascular: No leg edema.   Lab Results:  Lab Results  Component Value Date   WBC 4.6 12/19/2016   HGB 13.3 12/19/2016   HCT 38.7 12/19/2016   MCV 82.5 12/19/2016   PLT 169 12/19/2016   NEUTROABS 3.1 12/20/2014    Imaging:  No results found.  Medications: I have reviewed the patient's current medications.  Assessment/Plan: 1. 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  The tumor returned microsatellite stable with no loss of mismatch repair protein expression   APC mutated. No BRAF, KRAS, or NRAS mutation On Foundation 1 testing   Cycle 1 adjuvant FOLFOX 09/08/2013   Cycle 2 adjuvant FOLFOX 09/24/2013   Cycle 3 adjuvant FOLFOX 10/08/2013.   Cycle 4 adjuvant FOLFOX 10/22/2013.   Cycle 5 adjuvant FOLFOX 11/05/2013. Oxaliplatin held due to thrombocytopenia.  Cycle 6 FOLFOX 11/19/2013.  Cycle 7 FOLFOX 12/03/2013. Oxaliplatin held secondary to thrombocytopenia.  Cycle 8 FOLFOX 12/17/2013.  Cycle 9 FOLFOX 01/04/2014. Oxaliplatin held secondary to neutropenia.   Cycle 10 FOLFOX 01/21/2014. Oxaliplatin held secondary to thrombocytopenia.  Cycle 11 FOLFOX 02/04/2014  Cycle 12 FOLFOX  02/18/2014, oxaliplatin dose reduced secondary tothrombocytopenia  CT abdomen/pelvis 01/30/2014 revealed splenomegaly and no evidence of recurrent colon cancer  CT chest 04/07/2014 with a stable right lower lobe nodule and no evidence for metastatic disease, no nodules seen on the CT 11/26/2014  Markedly elevated CEA 11/24/2014  CT 11/26/2014 revealed a right pelvic mass, splenomegaly, small volume ascites  Right salpingo-oophorectomy 12/28/2014 with the pathology confirming metastatic colon cancer  CTs 03/23/2016-no evidence of recurrent or metastatic disease.  CT 11/26/2016-enlargement of a fluid density structure the right pelvic sidewall, no other evidence of metastatic disease  CT aspiration right pelvic cyst 12/19/2016.  Cytology-BENIGN REACTIVE/REPARATIVE CHANGES. 2. Mild elevation of the CEA beginning January 2016, normal on 05/19/2014  3. History of iron deficiency anemia  4. seizure disorder  5. history of depression  6. 4 mm right lower lobe nodule on a staging chest CT 09/08/2013 , stable on a CT 04/07/2014 7. Hospitalization 09/24/2013 through 09/26/2013 with fever and abdominal pain.  8. 09/24/2013 urine culture positive for coag negative staph.  9. 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 Splenomegaly noted on a CT scan 01/30/2014, the spleen is not palpable today     Disposition: [name] remains in clinical remission from colon cancer.  We will follow-up on the CEA from today.  She will undergo restaging CTs in 3 months.  We will schedule a follow-up visit a few days later to review those results.  She will contact the office in the interim with any problems.  Plan reviewed with Dr. Benay Spice.    Ned Card ANP/GNP-BC   03/08/2017  12:42 PM

## 2017-03-08 NOTE — Telephone Encounter (Signed)
Scheduled apt per 2/1 los - reminder letter sent in the mail.

## 2017-06-03 ENCOUNTER — Telehealth: Payer: Self-pay | Admitting: Oncology

## 2017-06-03 NOTE — Telephone Encounter (Signed)
Called pt re appt being moved - spoke with pt re appts.

## 2017-06-05 ENCOUNTER — Ambulatory Visit (HOSPITAL_COMMUNITY)
Admission: RE | Admit: 2017-06-05 | Discharge: 2017-06-05 | Disposition: A | Discharge status: Home / Self Care | Payer: Self-pay | Source: Ambulatory Visit | Attending: Nurse Practitioner | Admitting: Nurse Practitioner

## 2017-06-05 ENCOUNTER — Inpatient Hospital Stay: Payer: Self-pay | Attending: Oncology

## 2017-06-05 DIAGNOSIS — Z08 Encounter for follow-up examination after completed treatment for malignant neoplasm: Secondary | ICD-10-CM | POA: Insufficient documentation

## 2017-06-05 DIAGNOSIS — Z85038 Personal history of other malignant neoplasm of large intestine: Secondary | ICD-10-CM | POA: Insufficient documentation

## 2017-06-05 DIAGNOSIS — C182 Malignant neoplasm of ascending colon: Secondary | ICD-10-CM | POA: Insufficient documentation

## 2017-06-05 DIAGNOSIS — R1011 Right upper quadrant pain: Secondary | ICD-10-CM | POA: Insufficient documentation

## 2017-06-05 DIAGNOSIS — Z9049 Acquired absence of other specified parts of digestive tract: Secondary | ICD-10-CM | POA: Insufficient documentation

## 2017-06-05 LAB — CMP (CANCER CENTER ONLY)
ALK PHOS: 71 U/L (ref 40–150)
ALT: 42 U/L (ref 0–55)
AST: 32 U/L (ref 5–34)
Albumin: 4.4 g/dL (ref 3.5–5.0)
Anion gap: 7 (ref 3–11)
BILIRUBIN TOTAL: 0.9 mg/dL (ref 0.2–1.2)
BUN: 14 mg/dL (ref 7–26)
CALCIUM: 10.1 mg/dL (ref 8.4–10.4)
CHLORIDE: 106 mmol/L (ref 98–109)
CO2: 29 mmol/L (ref 22–29)
CREATININE: 0.86 mg/dL (ref 0.60–1.10)
Glucose, Bld: 99 mg/dL (ref 70–140)
Potassium: 4.4 mmol/L (ref 3.5–5.1)
Sodium: 142 mmol/L (ref 136–145)
TOTAL PROTEIN: 7.6 g/dL (ref 6.4–8.3)

## 2017-06-05 LAB — CEA (IN HOUSE-CHCC): CEA (CHCC-In House): <1 ng/mL (ref 0.00–5.00)

## 2017-06-05 MED ORDER — IOHEXOL 300 MG/ML  SOLN
100.0000 mL | Freq: Once | INTRAMUSCULAR | Status: AC | PRN
  Administered 2017-06-05: 100 mL via INTRAVENOUS

## 2017-06-06 ENCOUNTER — Telehealth: Payer: Self-pay | Admitting: *Deleted

## 2017-06-06 NOTE — Telephone Encounter (Signed)
Called pt with CT results. She reports RUQ pain that has been worsening over past 1.5 weeks. She asks if the scan showed anything that can be causing this pain.  Will review with MD.

## 2017-06-06 NOTE — Telephone Encounter (Signed)
-----   Message from [name], MD sent at 06/06/2017  2:46 PM EDT ----- Please call patient, the CT scans are negative for recurrent cancer, follow-up as scheduled

## 2017-06-07 ENCOUNTER — Ambulatory Visit: Payer: Self-pay | Admitting: Oncology

## 2017-06-10 ENCOUNTER — Telehealth: Payer: Self-pay | Admitting: Oncology

## 2017-06-10 ENCOUNTER — Inpatient Hospital Stay (HOSPITAL_BASED_OUTPATIENT_CLINIC_OR_DEPARTMENT_OTHER): Payer: Self-pay | Admitting: Oncology

## 2017-06-10 VITALS — BP 131/87 | HR 66 | Temp 98.5°F | Resp 13 | Ht 66.0 in | Wt 170.9 lb

## 2017-06-10 DIAGNOSIS — R1011 Right upper quadrant pain: Secondary | ICD-10-CM

## 2017-06-10 DIAGNOSIS — E8940 Asymptomatic postprocedural ovarian failure: Secondary | ICD-10-CM

## 2017-06-10 DIAGNOSIS — C182 Malignant neoplasm of ascending colon: Secondary | ICD-10-CM

## 2017-06-10 MED ORDER — ESTRADIOL 0.5 MG PO TABS: 0.5000 mg | ORAL_TABLET | Freq: Every day | ORAL | 11 refills | Status: DC

## 2017-06-10 NOTE — Addendum Note (Signed)
Addended by: Mathis Fare on: 06/10/2017 09:54 AM   Modules accepted: Orders

## 2017-06-10 NOTE — Progress Notes (Signed)
[place] OFFICE PROGRESS NOTE   Diagnosis: Colon cancer  INTERVAL HISTORY:   [name] returns as scheduled.  She reports discomfort in the right subcostal region for the past 1.5 weeks.  There is a constant dull pain that is worse after eating.  Mild nausea.  No emesis.  No difficulty with bowel or bladder function.  Good appetite.  No fever.  No trauma.  Ibuprofen did not help.  Objective:  Vital signs in last 24 hours:  Blood pressure 131/87, pulse 66, temperature 98.5 F (36.9 C), temperature source Oral, resp. rate 13, height 5' 6"  (1.676 m), weight 170 lb 14.4 oz (77.5 kg), SpO2 100 %.    HEENT: Neck without mass Lymphatics: No cervical, supraclavicular, axillary, or inguinal nodes Resp: Lungs clear bilaterally Cardio: Regular rate and rhythm GI: No hepatosplenomegaly, no mass, mild tenderness in the right lateral subcostal region Vascular: No leg edema    Lab Results:   Lab Results  Component Value Date   CEA1 <1.00 06/05/2017      Imaging: CT images from 06/05/2017-reviewed  Medications: I have reviewed the patient's current medications.   Assessment/Plan:  1. 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  The tumor returned microsatellite stable with no loss of mismatch repair protein expression   APC mutated. No BRAF, KRAS, or NRAS mutation On Foundation 1 testing   Cycle 1 adjuvant FOLFOX 09/08/2013   Cycle 2 adjuvant FOLFOX 09/24/2013   Cycle 3 adjuvant FOLFOX 10/08/2013.   Cycle 4 adjuvant FOLFOX 10/22/2013.   Cycle 5 adjuvant FOLFOX 11/05/2013. Oxaliplatin held due to thrombocytopenia.  Cycle 6 FOLFOX 11/19/2013.  Cycle 7 FOLFOX 12/03/2013. Oxaliplatin held secondary to thrombocytopenia.  Cycle 8 FOLFOX 12/17/2013.  Cycle 9 FOLFOX 01/04/2014. Oxaliplatin held secondary to neutropenia.   Cycle 10 FOLFOX 01/21/2014.  Oxaliplatin held secondary to thrombocytopenia.  Cycle 11 FOLFOX 02/04/2014  Cycle 12 FOLFOX 02/18/2014, oxaliplatin dose reduced secondary tothrombocytopenia  CT abdomen/pelvis 01/30/2014 revealed splenomegaly and no evidence of recurrent colon cancer  CT chest 04/07/2014 with a stable right lower lobe nodule and no evidence for metastatic disease, no nodules seen on the CT 11/26/2014  Markedly elevated CEA 11/24/2014  CT 11/26/2014 revealed a right pelvic mass, splenomegaly, small volume ascites  Right salpingo-oophorectomy 12/28/2014 with the pathology confirming metastatic colon cancer  CTs 03/23/2016-no evidence of recurrent or metastatic disease.  CT 11/26/2016-enlargement of a fluid density structure the right pelvic sidewall, no other evidence of metastatic disease  CT aspiration right pelvic cyst 12/19/2016.  Cytology-BENIGN REACTIVE/REPARATIVE CHANGES.  CTs 06/05/2017- no evidence of metastatic disease, mild cirrhotic changes with splenomegaly 2. Mild elevation of the CEA beginning January 2016, normal on 05/19/2014   3. History of iron deficiency anemia  4. seizure disorder  5. history of depression  6. 4 mm right lower lobe nodule on a staging chest CT 09/08/2013 , stable on a CT 04/07/2014 7. Hospitalization 09/24/2013 through 09/26/2013 with fever and abdominal pain.  8. 09/24/2013 urine culture positive for coag negative staph.  9. 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 Splenomegaly noted on a CT scan 01/30/2014, persistent on repeat CTs    Disposition: [name] remains in clinical remission from colon cancer.  The etiology of the right subcostal discomfort is unclear.  She will contact us if this pain has not improved within the next few weeks and we will consider a GI  or surgical referral. She will return for an office visit and CEA in 3 months.  15 minutes were  spent with the patient today.  The majority of the time was used for counseling and coordination of care.  Betsy Coder, MD  06/10/2017  8:44 AM

## 2017-06-10 NOTE — Telephone Encounter (Signed)
Schedule appt per 5/6 los - sent reminder letter in the mail with appt date and time. Lab and f/u in 3 months.

## 2017-09-05 ENCOUNTER — Telehealth: Payer: Self-pay

## 2017-09-05 ENCOUNTER — Telehealth: Payer: Self-pay | Admitting: Medical Oncology

## 2017-09-05 NOTE — Telephone Encounter (Signed)
Cough , cold like symptoms sinus drainage x 3 days. Took dayquil, nyquil, severe cold and flu, honey cinnamon. Cough  deeper today. Her mother had bronchitis 2 weeks ago.  No PCP, no insurance.

## 2017-09-05 NOTE — Telephone Encounter (Signed)
Spoke with pt regarding symptoms. Pt states she is experiencing "sinus drainage, cough". Pt denies fever and is requesting antibiotic. Per Dr. Benay Spice, pt should be seen by PCP or nearest urgent care. Pt stated that she does not have insurance. This RN provided clinic information for [facility]. Pt voiced gratitude and agreeable with plan

## 2017-09-10 ENCOUNTER — Encounter: Payer: Self-pay | Admitting: Nurse Practitioner

## 2017-09-10 ENCOUNTER — Inpatient Hospital Stay: Payer: Self-pay

## 2017-09-10 ENCOUNTER — Inpatient Hospital Stay: Payer: Self-pay | Attending: Oncology | Admitting: Nurse Practitioner

## 2017-09-10 VITALS — BP 143/87 | HR 70 | Temp 98.6°F | Resp 17 | Ht 66.0 in | Wt 170.2 lb

## 2017-09-10 DIAGNOSIS — C182 Malignant neoplasm of ascending colon: Secondary | ICD-10-CM

## 2017-09-10 DIAGNOSIS — C7961 Secondary malignant neoplasm of right ovary: Secondary | ICD-10-CM | POA: Insufficient documentation

## 2017-09-10 DIAGNOSIS — D6959 Other secondary thrombocytopenia: Secondary | ICD-10-CM | POA: Insufficient documentation

## 2017-09-10 DIAGNOSIS — G620 Drug-induced polyneuropathy: Secondary | ICD-10-CM

## 2017-09-10 DIAGNOSIS — F329 Major depressive disorder, single episode, unspecified: Secondary | ICD-10-CM | POA: Insufficient documentation

## 2017-09-10 DIAGNOSIS — R918 Other nonspecific abnormal finding of lung field: Secondary | ICD-10-CM

## 2017-09-10 LAB — CEA (IN HOUSE-CHCC): CEA (CHCC-In House): 1.14 ng/mL (ref 0.00–5.00)

## 2017-09-10 NOTE — Progress Notes (Signed)
[place] OFFICE PROGRESS NOTE   Diagnosis: Colon cancer  INTERVAL HISTORY:   [name] returns as scheduled.  She overall is feeling well.  No change in bowel habits.  No rectal bleeding.  No abdominal pain.  For the past week she has had a sore throat and rhinorrhea.  Symptoms are improving.  No fever.  No shortness of breath.  Objective:  Vital signs in last 24 hours:  Blood pressure (!) 143/87, pulse 70, temperature 98.6 F (37 C), temperature source Oral, resp. rate 17, height 5' 6"  (1.676 m), weight 170 lb 3.2 oz (77.2 kg), SpO2 95 %.    HEENT: Neck without mass. Lymphatics: No palpable cervical, supraclavicular, axillary or inguinal lymph nodes. Resp: Lungs clear bilaterally. Cardio: Regular rate and rhythm. GI: Abdomen soft and nontender.  No hepatomegaly.  No mass. Vascular: No leg edema.    Lab Results:  Lab Results  Component Value Date   WBC 4.6 12/19/2016   HGB 13.3 12/19/2016   HCT 38.7 12/19/2016   MCV 82.5 12/19/2016   PLT 169 12/19/2016   NEUTROABS 3.1 12/20/2014    Imaging:  No results found.  Medications: I have reviewed the patient's current medications.  Assessment/Plan: 1. 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  The tumor returned microsatellite stable with no loss of mismatch repair protein expression   APC mutated. No BRAF, KRAS, or NRAS mutation On Foundation 1 testing   Cycle 1 adjuvant FOLFOX 09/08/2013   Cycle 2 adjuvant FOLFOX 09/24/2013   Cycle 3 adjuvant FOLFOX 10/08/2013.   Cycle 4 adjuvant FOLFOX 10/22/2013.   Cycle 5 adjuvant FOLFOX 11/05/2013. Oxaliplatin held due to thrombocytopenia.  Cycle 6 FOLFOX 11/19/2013.  Cycle 7 FOLFOX 12/03/2013. Oxaliplatin held secondary to thrombocytopenia.  Cycle 8 FOLFOX 12/17/2013.  Cycle 9 FOLFOX 01/04/2014. Oxaliplatin held secondary to neutropenia.    Cycle 10 FOLFOX 01/21/2014. Oxaliplatin held secondary to thrombocytopenia.  Cycle 11 FOLFOX 02/04/2014  Cycle 12 FOLFOX 02/18/2014, oxaliplatin dose reduced secondary tothrombocytopenia  CT abdomen/pelvis 01/30/2014 revealed splenomegaly and no evidence of recurrent colon cancer  CT chest 04/07/2014 with a stable right lower lobe nodule and no evidence for metastatic disease, no nodules seen on the CT 11/26/2014  Markedly elevated CEA 11/24/2014  CT 11/26/2014 revealed a right pelvic mass, splenomegaly, small volume ascites  Right salpingo-oophorectomy 12/28/2014 with the pathology confirming metastatic colon cancer  CTs 03/23/2016-no evidence of recurrent or metastatic disease.  CT 11/26/2016-enlargement of a fluid density structure the right pelvic sidewall, no other evidence of metastatic disease  CT aspiration right pelvic cyst 12/19/2016.Cytology-BENIGN REACTIVE/REPARATIVE CHANGES.  CTs 06/05/2017- no evidence of metastatic disease, mild cirrhotic changes with splenomegaly 2. Mild elevation of the CEA beginning January 2016, normal on 05/19/2014   3. History of iron deficiency anemia  4. seizure disorder  5. history of depression  6. 4 mm right lower lobe nodule on a staging chest CT 09/08/2013 , stable on a CT 04/07/2014 7. Hospitalization 09/24/2013 through 09/26/2013 with fever and abdominal pain.  8. 09/24/2013 urine culture positive for coag negative staph.  9. 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 Splenomegaly noted on a CT scan 01/30/2014, persistent on repeat CTs     Disposition: [name] remains in clinical remission from colon cancer.  We will follow-up on the CEA from today.  She will return for lab and restaging CT scans in 3  months, follow-up visit 2 to 3 days later to review the results.  She will contact the office in the interim with any  problems.  Plan reviewed with Dr. Benay Spice.    Ned Card ANP/GNP-BC   09/10/2017  9:26 AM

## 2017-09-11 ENCOUNTER — Telehealth: Payer: Self-pay | Admitting: Nurse Practitioner

## 2017-09-11 NOTE — Telephone Encounter (Signed)
Scheduled appt per 8/6 los - sent reminder letter in the mail with appt date and time. Central radiology to contact patient with ct scan .

## 2017-09-13 ENCOUNTER — Telehealth: Payer: Self-pay | Admitting: Emergency Medicine

## 2017-09-13 NOTE — Telephone Encounter (Addendum)
VM left for pt to call back   ----- Message from Owens Shark, NP sent at 09/13/2017  8:54 AM EDT ----- Please let her know CEA is in normal range.  Follow-up as scheduled.

## 2017-11-25 ENCOUNTER — Encounter: Payer: Self-pay | Admitting: Internal Medicine

## 2017-12-02 ENCOUNTER — Encounter: Payer: Self-pay | Admitting: Internal Medicine

## 2017-12-11 ENCOUNTER — Ambulatory Visit (HOSPITAL_COMMUNITY)
Admission: RE | Admit: 2017-12-11 | Discharge: 2017-12-11 | Disposition: A | Discharge status: Home / Self Care | Payer: Self-pay | Source: Ambulatory Visit | Attending: Nurse Practitioner | Admitting: Nurse Practitioner

## 2017-12-11 ENCOUNTER — Inpatient Hospital Stay: Payer: Self-pay | Attending: Oncology

## 2017-12-11 ENCOUNTER — Encounter (HOSPITAL_COMMUNITY): Payer: Self-pay

## 2017-12-11 DIAGNOSIS — D509 Iron deficiency anemia, unspecified: Secondary | ICD-10-CM | POA: Insufficient documentation

## 2017-12-11 DIAGNOSIS — Z85038 Personal history of other malignant neoplasm of large intestine: Secondary | ICD-10-CM | POA: Insufficient documentation

## 2017-12-11 DIAGNOSIS — R161 Splenomegaly, not elsewhere classified: Secondary | ICD-10-CM | POA: Insufficient documentation

## 2017-12-11 DIAGNOSIS — C182 Malignant neoplasm of ascending colon: Secondary | ICD-10-CM

## 2017-12-11 DIAGNOSIS — R1900 Intra-abdominal and pelvic swelling, mass and lump, unspecified site: Secondary | ICD-10-CM | POA: Insufficient documentation

## 2017-12-11 DIAGNOSIS — F329 Major depressive disorder, single episode, unspecified: Secondary | ICD-10-CM | POA: Insufficient documentation

## 2017-12-11 DIAGNOSIS — Z9221 Personal history of antineoplastic chemotherapy: Secondary | ICD-10-CM | POA: Insufficient documentation

## 2017-12-11 DIAGNOSIS — G40909 Epilepsy, unspecified, not intractable, without status epilepticus: Secondary | ICD-10-CM | POA: Insufficient documentation

## 2017-12-11 DIAGNOSIS — K6389 Other specified diseases of intestine: Secondary | ICD-10-CM | POA: Insufficient documentation

## 2017-12-11 DIAGNOSIS — Z9049 Acquired absence of other specified parts of digestive tract: Secondary | ICD-10-CM | POA: Insufficient documentation

## 2017-12-11 LAB — CMP (CANCER CENTER ONLY)
ALT: 38 U/L (ref 0–44)
ANION GAP: 5 (ref 5–15)
AST: 30 U/L (ref 15–41)
Albumin: 4 g/dL (ref 3.5–5.0)
Alkaline Phosphatase: 66 U/L (ref 38–126)
BILIRUBIN TOTAL: 1.1 mg/dL (ref 0.3–1.2)
BUN: 16 mg/dL (ref 6–20)
CO2: 28 mmol/L (ref 22–32)
Calcium: 9.2 mg/dL (ref 8.9–10.3)
Chloride: 107 mmol/L (ref 98–111)
Creatinine: 0.78 mg/dL (ref 0.44–1.00)
Glucose, Bld: 97 mg/dL (ref 70–99)
POTASSIUM: 4.1 mmol/L (ref 3.5–5.1)
Sodium: 140 mmol/L (ref 135–145)
TOTAL PROTEIN: 7.1 g/dL (ref 6.5–8.1)

## 2017-12-11 LAB — CEA (IN HOUSE-CHCC): CEA (CHCC-IN HOUSE): 1.9 ng/mL (ref 0.00–5.00)

## 2017-12-11 MED ORDER — IOHEXOL 300 MG/ML  SOLN
100.0000 mL | Freq: Once | INTRAMUSCULAR | Status: AC | PRN
  Administered 2017-12-11: 100 mL via INTRAVENOUS

## 2017-12-11 MED ORDER — SODIUM CHLORIDE (PF) 0.9 % IJ SOLN
INTRAMUSCULAR | Status: AC
  Filled 2017-12-11: qty 50

## 2017-12-13 ENCOUNTER — Inpatient Hospital Stay (HOSPITAL_BASED_OUTPATIENT_CLINIC_OR_DEPARTMENT_OTHER): Payer: Self-pay | Admitting: Oncology

## 2017-12-13 ENCOUNTER — Telehealth: Payer: Self-pay | Admitting: Oncology

## 2017-12-13 VITALS — BP 133/84 | HR 58 | Temp 97.8°F | Resp 18 | Ht 66.0 in | Wt 171.4 lb

## 2017-12-13 DIAGNOSIS — D509 Iron deficiency anemia, unspecified: Secondary | ICD-10-CM

## 2017-12-13 DIAGNOSIS — R161 Splenomegaly, not elsewhere classified: Secondary | ICD-10-CM

## 2017-12-13 DIAGNOSIS — Z9049 Acquired absence of other specified parts of digestive tract: Secondary | ICD-10-CM

## 2017-12-13 DIAGNOSIS — G40909 Epilepsy, unspecified, not intractable, without status epilepticus: Secondary | ICD-10-CM

## 2017-12-13 DIAGNOSIS — Z85038 Personal history of other malignant neoplasm of large intestine: Secondary | ICD-10-CM

## 2017-12-13 DIAGNOSIS — C182 Malignant neoplasm of ascending colon: Secondary | ICD-10-CM

## 2017-12-13 DIAGNOSIS — Z9221 Personal history of antineoplastic chemotherapy: Secondary | ICD-10-CM

## 2017-12-13 NOTE — Telephone Encounter (Signed)
Appts scheduled patient notified letter/calendar mailed per 11/8 los

## 2017-12-13 NOTE — Progress Notes (Signed)
[place] OFFICE PROGRESS NOTE   Diagnosis: Colon cancer  INTERVAL HISTORY:   [name] returns as scheduled.  She feels well.  No complaint.  She is working.  Objective:  Vital signs in last 24 hours:  Blood pressure 133/84, pulse (!) 58, temperature 97.8 F (36.6 C), temperature source Oral, resp. rate 18, height _0  (1.676 m), weight 171 lb 6.4 oz (77.7 kg), SpO2 100 %.    HEENT: Neck without mass Lymphatics: Cervical, supraclavicular, axillary, or inguinal nodes Resp: Lungs clear bilaterally Cardio: Regular rate and rhythm GI: No mass, nontender, no hepatosplenomegaly Vascular: No leg edema   Lab Results:  Lab Results  Component Value Date   WBC 4.6 12/19/2016   HGB 13.3 12/19/2016   HCT 38.7 12/19/2016   MCV 82.5 12/19/2016   PLT 169 12/19/2016   NEUTROABS 3.1 12/20/2014    CMP  Lab Results  Component Value Date   NA 140 12/11/2017   K 4.1 12/11/2017   CL 107 12/11/2017   CO2 28 12/11/2017   GLUCOSE 97 12/11/2017   BUN 16 12/11/2017   CREATININE 0.78 12/11/2017   CALCIUM 9.2 12/11/2017   PROT 7.1 12/11/2017   ALBUMIN 4.0 12/11/2017   AST 30 12/11/2017   ALT 38 12/11/2017   ALKPHOS 66 12/11/2017   BILITOT 1.1 12/11/2017   GFRNONAA >60 12/11/2017   GFRAA >60 12/11/2017    Lab Results  Component Value Date   CEA1 1.90 12/11/2017    Lab Results  Component Value Date   INR 1.02 12/19/2016    Imaging:  Ct Chest W Contrast  Result Date: 12/11/2017 CLINICAL DATA:  Stage IIIc ascending colon cancer status post right hemicolectomy 08/11/2013. Right adnexal metastasis status post right salpingo-oophorectomy 12/28/2014. Chemotherapy completed 2016. Restaging. EXAM: CT CHEST, ABDOMEN, AND PELVIS WITH CONTRAST TECHNIQUE: Multidetector CT imaging of the chest, abdomen and pelvis was performed following the standard protocol during bolus administration of intravenous contrast. CONTRAST:  133m OMNIPAQUE IOHEXOL 300 MG/ML  SOLN  COMPARISON:  06/05/2017 CT chest, abdomen and pelvis. FINDINGS: CT CHEST FINDINGS Cardiovascular: Normal heart size. No significant pericardial effusion/thickening. Great vessels are normal in course and caliber. No central pulmonary emboli. Mediastinum/Nodes: No discrete thyroid nodules. Unremarkable esophagus. No pathologically enlarged axillary, mediastinal or hilar lymph nodes. Lungs/Pleura: No pneumothorax. No pleural effusion. No acute consolidative airspace disease, lung masses or significant pulmonary nodules. Musculoskeletal: No aggressive appearing focal osseous lesions. Mild thoracic spondylosis. CT ABDOMEN PELVIS FINDINGS Hepatobiliary: Normal liver with no liver mass. Normal gallbladder with no radiopaque cholelithiasis. No biliary ductal dilatation. Pancreas: Normal, with no mass or duct dilation. Spleen: Mildly enlarged spleen (craniocaudal splenic length 14.3 cm), stable using similar measurement technique. No splenic mass. Adrenals/Urinary Tract: Normal adrenals. Subcentimeter hypodense renal cortical lesion in the lower left kidney, too small to characterize, unchanged, considered benign. No new renal lesions. No hydronephrosis. Normal nondistended bladder. Stomach/Bowel: Normal non-distended stomach. Stable postsurgical changes from subtotal right hemicolectomy with intact appearing ileocolic anastomosis in the right abdomen. No dilated small bowel loops or small bowel wall thickening. Oral contrast transits to the descending colon. No mass or wall thickening at the ileocolic anastomosis. Moderate stool throughout the remnant large-bowel. No wall thickening, significant diverticulosis or acute pericolonic fat stranding in the remnant large-bowel. Vascular/Lymphatic: Normal caliber abdominal aorta. Patent portal, splenic, hepatic and renal veins. Stable dilated splenic vein. No pathologically enlarged lymph nodes in the abdomen or pelvis. Reproductive: Status post hysterectomy. There is a recurrent  cystic 3.0  x 2.1 cm right adnexal mass (series 2/image 108), new since 06/05/2017 CT, similar to the previously aspirated structure on the 11/26/2016 CT. No left adnexal mass. Other: No pneumoperitoneum.  No ascites. Musculoskeletal: No aggressive appearing focal osseous lesions. Moderate lumbar spondylosis. IMPRESSION: 1. No findings highly suspicious for metastatic disease in the chest, abdomen or pelvis. 2. Recurrent cystic 3.0 x 2.1 cm right adnexal mass, new since 06/05/2017 CT, similar to the previously aspirated structure on the 11/26/2016 CT. This structure is indeterminate, potentially a recurrent benign cyst. Recurrent necrotic metastasis is less likely but difficult to exclude given the previous history of resected right adnexal metastasis. 3. No evidence of recurrent tumor at the ileocolic anastomosis in the right abdomen. 4. Stable mild splenomegaly with dilated splenic vein. Electronically Signed   By: Ilona Sorrel M.D.   On: 12/11/2017 14:46   Ct Abdomen Pelvis W Contrast  Result Date: 12/11/2017 CLINICAL DATA:  Stage IIIc ascending colon cancer status post right hemicolectomy 08/11/2013. Right adnexal metastasis status post right salpingo-oophorectomy 12/28/2014. Chemotherapy completed 2016. Restaging. EXAM: CT CHEST, ABDOMEN, AND PELVIS WITH CONTRAST TECHNIQUE: Multidetector CT imaging of the chest, abdomen and pelvis was performed following the standard protocol during bolus administration of intravenous contrast. CONTRAST:  190m OMNIPAQUE IOHEXOL 300 MG/ML  SOLN COMPARISON:  06/05/2017 CT chest, abdomen and pelvis. FINDINGS: CT CHEST FINDINGS Cardiovascular: Normal heart size. No significant pericardial effusion/thickening. Great vessels are normal in course and caliber. No central pulmonary emboli. Mediastinum/Nodes: No discrete thyroid nodules. Unremarkable esophagus. No pathologically enlarged axillary, mediastinal or hilar lymph nodes. Lungs/Pleura: No pneumothorax. No pleural effusion.  No acute consolidative airspace disease, lung masses or significant pulmonary nodules. Musculoskeletal: No aggressive appearing focal osseous lesions. Mild thoracic spondylosis. CT ABDOMEN PELVIS FINDINGS Hepatobiliary: Normal liver with no liver mass. Normal gallbladder with no radiopaque cholelithiasis. No biliary ductal dilatation. Pancreas: Normal, with no mass or duct dilation. Spleen: Mildly enlarged spleen (craniocaudal splenic length 14.3 cm), stable using similar measurement technique. No splenic mass. Adrenals/Urinary Tract: Normal adrenals. Subcentimeter hypodense renal cortical lesion in the lower left kidney, too small to characterize, unchanged, considered benign. No new renal lesions. No hydronephrosis. Normal nondistended bladder. Stomach/Bowel: Normal non-distended stomach. Stable postsurgical changes from subtotal right hemicolectomy with intact appearing ileocolic anastomosis in the right abdomen. No dilated small bowel loops or small bowel wall thickening. Oral contrast transits to the descending colon. No mass or wall thickening at the ileocolic anastomosis. Moderate stool throughout the remnant large-bowel. No wall thickening, significant diverticulosis or acute pericolonic fat stranding in the remnant large-bowel. Vascular/Lymphatic: Normal caliber abdominal aorta. Patent portal, splenic, hepatic and renal veins. Stable dilated splenic vein. No pathologically enlarged lymph nodes in the abdomen or pelvis. Reproductive: Status post hysterectomy. There is a recurrent cystic 3.0 x 2.1 cm right adnexal mass (series 2/image 108), new since 06/05/2017 CT, similar to the previously aspirated structure on the 11/26/2016 CT. No left adnexal mass. Other: No pneumoperitoneum.  No ascites. Musculoskeletal: No aggressive appearing focal osseous lesions. Moderate lumbar spondylosis. IMPRESSION: 1. No findings highly suspicious for metastatic disease in the chest, abdomen or pelvis. 2. Recurrent cystic 3.0 x  2.1 cm right adnexal mass, new since 06/05/2017 CT, similar to the previously aspirated structure on the 11/26/2016 CT. This structure is indeterminate, potentially a recurrent benign cyst. Recurrent necrotic metastasis is less likely but difficult to exclude given the previous history of resected right adnexal metastasis. 3. No evidence of recurrent tumor at the ileocolic anastomosis in the right abdomen.  4. Stable mild splenomegaly with dilated splenic vein. Electronically Signed   By: Ilona Sorrel M.D.   On: 12/11/2017 14:46    Medications: I have reviewed the patient's current medications.   Assessment/Plan: 1. 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  The tumor returned microsatellite stable with no loss of mismatch repair protein expression   APC mutated. No BRAF, KRAS, or NRAS mutation On Foundation 1 testing   Cycle 1 adjuvant FOLFOX 09/08/2013   Cycle 2 adjuvant FOLFOX 09/24/2013   Cycle 3 adjuvant FOLFOX 10/08/2013.   Cycle 4 adjuvant FOLFOX 10/22/2013.   Cycle 5 adjuvant FOLFOX 11/05/2013. Oxaliplatin held due to thrombocytopenia.  Cycle 6 FOLFOX 11/19/2013.  Cycle 7 FOLFOX 12/03/2013. Oxaliplatin held secondary to thrombocytopenia.  Cycle 8 FOLFOX 12/17/2013.  Cycle 9 FOLFOX 01/04/2014. Oxaliplatin held secondary to neutropenia.   Cycle 10 FOLFOX 01/21/2014. Oxaliplatin held secondary to thrombocytopenia.  Cycle 11 FOLFOX 02/04/2014  Cycle 12 FOLFOX 02/18/2014, oxaliplatin dose reduced secondary tothrombocytopenia  CT abdomen/pelvis 01/30/2014 revealed splenomegaly and no evidence of recurrent colon cancer  CT chest 04/07/2014 with a stable right lower lobe nodule and no evidence for metastatic disease, no nodules seen on the CT 11/26/2014  Markedly elevated CEA 11/24/2014  CT 11/26/2014 revealed a right pelvic mass, splenomegaly, small volume  ascites  Right salpingo-oophorectomy 12/28/2014 with the pathology confirming metastatic colon cancer  CTs 03/23/2016-no evidence of recurrent or metastatic disease.  CT 11/26/2016-enlargement of a fluid density structure the right pelvic sidewall, no other evidence of metastatic disease  CT aspiration right pelvic cyst 12/19/2016.Cytology-BENIGN REACTIVE/REPARATIVE CHANGES.  CTs 06/05/2017- no evidence of metastatic disease, mild cirrhotic changes with splenomegaly  CTs 12/11/2017- recurrent cystic right adnexal mass, similar to on the CT 11/26/2016, stable mild spinal megaly 2. Mild elevation of the CEA beginning January 2016, normal on 05/19/2014   3. History of iron deficiency anemia  4. seizure disorder  5. history of depression  6. 4 mm right lower lobe nodule on a staging chest CT 09/08/2013 , stable on a CT 04/07/2014 7. Hospitalization 09/24/2013 through 09/26/2013 with fever and abdominal pain.  8. 09/24/2013 urine culture positive for coag negative staph.  9. 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 Splenomegaly noted on a CT scan 01/30/2014,persistent on repeat CTs      Disposition: [name] is in clinical remission from colon cancer.  The cystic pelvic lesion is likely a benign finding.  Aspiration of this lesion in November 2018 was negative for malignancy.  I will present her case at the GI tumor conference.  The CEA is normal, but slightly higher.  She will return for a CEA in 2 months and an office visit in 4 months.  15 minutes were spent with the patient today.  The majority of the time was used for counseling and coordination of care.  Betsy Coder, MD  12/13/2017  8:20 AM

## 2017-12-31 ENCOUNTER — Other Ambulatory Visit: Payer: Self-pay

## 2017-12-31 ENCOUNTER — Ambulatory Visit (AMBULATORY_SURGERY_CENTER): Payer: Self-pay

## 2017-12-31 VITALS — Ht 66.0 in | Wt 170.6 lb

## 2017-12-31 DIAGNOSIS — Z85038 Personal history of other malignant neoplasm of large intestine: Secondary | ICD-10-CM

## 2017-12-31 MED ORDER — NA SULFATE-K SULFATE-MG SULF 17.5-3.13-1.6 GM/177ML PO SOLN: 1.0000 | Freq: Once | ORAL | 0 refills | Status: AC

## 2017-12-31 NOTE — Progress Notes (Signed)
No egg or soy allergy known to patient Patient can eat foods with eggs in it, but not eating eggs by themselves.  No issues with past sedation with any surgeries  or procedures, no intubation problems  No diet pills per patient No home 02 use per patient  No blood thinners per patient  Pt denies issues with constipation  No A fib or A flutter  EMMI video sent to pt's e mail

## 2018-01-14 ENCOUNTER — Ambulatory Visit (AMBULATORY_SURGERY_CENTER): Payer: Self-pay | Admitting: Internal Medicine

## 2018-01-14 ENCOUNTER — Encounter: Payer: Self-pay | Admitting: Internal Medicine

## 2018-01-14 VITALS — BP 96/61 | HR 64 | Temp 97.3°F | Resp 10 | Ht 66.0 in | Wt 171.0 lb

## 2018-01-14 DIAGNOSIS — Z85038 Personal history of other malignant neoplasm of large intestine: Secondary | ICD-10-CM

## 2018-01-14 MED ORDER — SODIUM CHLORIDE 0.9 % IV SOLN: 500.0000 mL | Freq: Once | INTRAVENOUS | Status: DC

## 2018-01-14 NOTE — Op Note (Signed)
[place] Patient Name: [name] Procedure Date: 01/14/2018 10:29 AM MRN: [id number] Endoscopist: Docia Chuck. Henrene Pastor , MD Age: 42 Referring MD:  Date of Birth: 05-Jan-1976 Gender: Female Account #: [id number] Procedure:                Colonoscopy Indications:              High risk colon cancer surveillance: Personal                            history of colon cancer. Right colon cancer 2015                            status post right hemicolectomy. Last follow-up                            exam September 2016 Medicines:                Monitored Anesthesia Care Procedure:                Pre-Anesthesia Assessment:                           - Prior to the procedure, a History and Physical                            was performed, and patient medications and                            allergies were reviewed. The patient's tolerance of                            previous anesthesia was also reviewed. The risks                            and benefits of the procedure and the sedation                            options and risks were discussed with the patient.                            All questions were answered, and informed consent                            was obtained. Prior Anticoagulants: The patient has                            taken no previous anticoagulant or antiplatelet                            agents. ASA Grade Assessment: II - A patient with                            mild systemic disease. After reviewing the risks  and benefits, the patient was deemed in                            satisfactory condition to undergo the procedure.                           After obtaining informed consent, the colonoscope                            was passed under direct vision. Throughout the                            procedure, the patient's blood pressure, pulse, and                            oxygen saturations were monitored continuously.  The                            Model CF-HQ190L [id number]) scope was introduced                            through the anus and advanced to the the                            ileocolonic anastomosis. The terminal ileum and the                            rectum were photographed. The quality of the bowel                            preparation was excellent. The colonoscopy was                            performed without difficulty. The patient tolerated                            the procedure well. The bowel preparation used was                            SUPREP. Scope In: 10:34:18 AM Scope Out: 10:44:13 AM Scope Withdrawal Time: 0 hours 7 minutes 30 seconds  Total Procedure Duration: 0 hours 9 minutes 55 seconds  Findings:                 Status post right hemicolectomy. The entire                            examined colon appeared normal on direct and                            retroflexion views. Complications:            No immediate complications. Estimated blood loss:                            None.  Estimated Blood Loss:     Estimated blood loss: none. Impression:               - Status post right hemicolectomy. The entire                            examined colon is otherwise normal on direct and                            retroflexion views.                           - No specimens collected. Recommendation:           - Repeat colonoscopy in 5 years for surveillance.                           - Patient has a contact number available for                            emergencies. The signs and symptoms of potential                            delayed complications were discussed with the                            patient. Return to normal activities tomorrow.                            Written discharge instructions were provided to the                            patient.                           - Resume previous diet.                           - Continue present  medications. Docia Chuck. Henrene Pastor, MD 01/14/2018 10:48:42 AM This report has been signed electronically.

## 2018-01-14 NOTE — Patient Instructions (Signed)
YOU HAD AN ENDOSCOPIC PROCEDURE TODAY AT THE [facility]:   Refer to the procedure report that was given to you for any specific questions about what was found during the examination.  If the procedure report does not answer your questions, please call your gastroenterologist to clarify.  If you requested that your care partner not be given the details of your procedure findings, then the procedure report has been included in a sealed envelope for you to review at your convenience later.  YOU SHOULD EXPECT: Some feelings of bloating in the abdomen. Passage of more gas than usual.  Walking can help get rid of the air that was put into your GI tract during the procedure and reduce the bloating. If you had a lower endoscopy (such as a colonoscopy or flexible sigmoidoscopy) you may notice spotting of blood in your stool or on the toilet paper. If you underwent a bowel prep for your procedure, you may not have a normal bowel movement for a few days.  Please Note:  You might notice some irritation and congestion in your nose or some drainage.  This is from the oxygen used during your procedure.  There is no need for concern and it should clear up in a day or so.  SYMPTOMS TO REPORT IMMEDIATELY:   Following lower endoscopy (colonoscopy or flexible sigmoidoscopy):  Excessive amounts of blood in the stool  Significant tenderness or worsening of abdominal pains  Swelling of the abdomen that is new, acute  Fever of 100F or higher  For urgent or emergent issues, a gastroenterologist can be reached at any hour by calling [phone].   DIET:  We do recommend a small meal at first, but then you may proceed to your regular diet.  Drink plenty of fluids but you should avoid alcoholic beverages for 24 hours.  MEDICATIONS:  Continue present medications.  ACTIVITY:  You should plan to take it easy for the rest of today and you should NOT DRIVE or use heavy machinery until tomorrow (because of the  sedation medicines used during the test).    FOLLOW UP: Our staff will call the number listed on your records the next business day following your procedure to check on you and address any questions or concerns that you may have regarding the information given to you following your procedure. If we do not reach you, we will leave a message.  However, if you are feeling well and you are not experiencing any problems, there is no need to return our call.  We will assume that you have returned to your regular daily activities without incident.  If any biopsies were taken you will be contacted by phone or by letter within the next 1-3 weeks.  Please call us at [phone] if you have not heard about the biopsies in 3 weeks.   Thank you for allowing us to provide for your healthcare needs today.   SIGNATURES/CONFIDENTIALITY: You and/or your care partner have signed paperwork which will be entered into your electronic medical record.  These signatures attest to the fact that that the information above on your After Visit Summary has been reviewed and is understood.  Full responsibility of the confidentiality of this discharge information lies with you and/or your care-partner. 

## 2018-01-14 NOTE — Progress Notes (Signed)
PT taken to PACU. Monitors in place. VSS. Report given to RN. 

## 2018-01-15 ENCOUNTER — Telehealth: Payer: Self-pay

## 2018-01-15 NOTE — Telephone Encounter (Signed)
  Follow up Call-  Call back number 01/14/2018  Post procedure Call Back phone  # [phone]  Permission to leave phone message Yes  Some recent data might be hidden     Patient questions:  Do you have a fever, pain , or abdominal swelling? No. Pain Score  0 *  Have you tolerated food without any problems? Yes.    Have you been able to return to your normal activities? Yes.    Do you have any questions about your discharge instructions: Diet   No. Medications  No. Follow up visit  No.  Do you have questions or concerns about your Care? No.  Actions: * If pain score is 4 or above: No action needed, pain <4.

## 2018-02-12 ENCOUNTER — Telehealth: Payer: Self-pay | Admitting: Nurse Practitioner

## 2018-02-12 NOTE — Telephone Encounter (Signed)
R/s appt per 1/8 sch message - pt is aware of appt date and time   

## 2018-02-14 ENCOUNTER — Inpatient Hospital Stay: Payer: Self-pay

## 2018-02-14 ENCOUNTER — Inpatient Hospital Stay: Payer: Self-pay | Attending: Oncology

## 2018-02-14 DIAGNOSIS — R161 Splenomegaly, not elsewhere classified: Secondary | ICD-10-CM | POA: Insufficient documentation

## 2018-02-14 DIAGNOSIS — D509 Iron deficiency anemia, unspecified: Secondary | ICD-10-CM | POA: Insufficient documentation

## 2018-02-14 DIAGNOSIS — C182 Malignant neoplasm of ascending colon: Secondary | ICD-10-CM

## 2018-02-14 DIAGNOSIS — Z9221 Personal history of antineoplastic chemotherapy: Secondary | ICD-10-CM | POA: Insufficient documentation

## 2018-02-14 DIAGNOSIS — Z85038 Personal history of other malignant neoplasm of large intestine: Secondary | ICD-10-CM | POA: Insufficient documentation

## 2018-02-14 DIAGNOSIS — G40909 Epilepsy, unspecified, not intractable, without status epilepticus: Secondary | ICD-10-CM | POA: Insufficient documentation

## 2018-02-14 DIAGNOSIS — Z9049 Acquired absence of other specified parts of digestive tract: Secondary | ICD-10-CM | POA: Insufficient documentation

## 2018-02-17 LAB — CEA (IN HOUSE-CHCC): CEA (CHCC-In House): 2.74 ng/mL (ref 0.00–5.00)

## 2018-04-18 ENCOUNTER — Encounter: Payer: Self-pay | Admitting: Nurse Practitioner

## 2018-04-18 ENCOUNTER — Other Ambulatory Visit: Payer: Self-pay

## 2018-04-18 ENCOUNTER — Inpatient Hospital Stay (HOSPITAL_BASED_OUTPATIENT_CLINIC_OR_DEPARTMENT_OTHER): Payer: Self-pay | Admitting: Nurse Practitioner

## 2018-04-18 ENCOUNTER — Inpatient Hospital Stay: Payer: Self-pay | Attending: Oncology

## 2018-04-18 VITALS — BP 132/81 | HR 71 | Temp 98.3°F | Resp 17 | Ht 66.0 in | Wt 172.9 lb

## 2018-04-18 DIAGNOSIS — C182 Malignant neoplasm of ascending colon: Secondary | ICD-10-CM

## 2018-04-18 DIAGNOSIS — R1907 Generalized intra-abdominal and pelvic swelling, mass and lump: Secondary | ICD-10-CM | POA: Insufficient documentation

## 2018-04-18 DIAGNOSIS — G40909 Epilepsy, unspecified, not intractable, without status epilepticus: Secondary | ICD-10-CM | POA: Insufficient documentation

## 2018-04-18 DIAGNOSIS — Z85038 Personal history of other malignant neoplasm of large intestine: Secondary | ICD-10-CM | POA: Insufficient documentation

## 2018-04-18 LAB — CEA (IN HOUSE-CHCC): CEA (CHCC-In House): 3.29 ng/mL (ref 0.00–5.00)

## 2018-04-18 NOTE — Addendum Note (Signed)
Addended by: Owens Shark on: 04/18/2018 10:41 AM   Modules accepted: Orders

## 2018-04-18 NOTE — Progress Notes (Signed)
[place] OFFICE PROGRESS NOTE   Diagnosis: Colon cancer  INTERVAL HISTORY:   [name] returns as scheduled.  She feels well.  No change in bowel habits.  No abdominal pain.  No nausea or vomiting.  She has a good appetite.  Objective:  Vital signs in last 24 hours:  Blood pressure 132/81, pulse 71, temperature 98.3 F (36.8 C), temperature source Oral, resp. rate 17, height 5' 6"  (1.676 m), weight 172 lb 14.4 oz (78.4 kg), SpO2 100 %.    HEENT: Neck without mass. Lymphatics: No palpable cervical, supraclavicular, axillary or inguinal lymph nodes. Resp: Lungs clear bilaterally. Cardio: Regular rate and rhythm. GI: Abdomen soft and nontender.  No hepatomegaly.  No mass. Vascular: No leg edema.   Lab Results:  Lab Results  Component Value Date   WBC 4.6 12/19/2016   HGB 13.3 12/19/2016   HCT 38.7 12/19/2016   MCV 82.5 12/19/2016   PLT 169 12/19/2016   NEUTROABS 3.1 12/20/2014    Imaging:  No results found.  Medications: I have reviewed the patient's current medications.  Assessment/Plan: 1. 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  The tumor returned microsatellite stable with no loss of mismatch repair protein expression   APC mutated. No BRAF, KRAS, or NRAS mutation On Foundation 1 testing   Cycle 1 adjuvant FOLFOX 09/08/2013   Cycle 2 adjuvant FOLFOX 09/24/2013   Cycle 3 adjuvant FOLFOX 10/08/2013.   Cycle 4 adjuvant FOLFOX 10/22/2013.   Cycle 5 adjuvant FOLFOX 11/05/2013. Oxaliplatin held due to thrombocytopenia.  Cycle 6 FOLFOX 11/19/2013.  Cycle 7 FOLFOX 12/03/2013. Oxaliplatin held secondary to thrombocytopenia.  Cycle 8 FOLFOX 12/17/2013.  Cycle 9 FOLFOX 01/04/2014. Oxaliplatin held secondary to neutropenia.   Cycle 10 FOLFOX 01/21/2014. Oxaliplatin held secondary to thrombocytopenia.  Cycle 11 FOLFOX 02/04/2014   Cycle 12 FOLFOX 02/18/2014, oxaliplatin dose reduced secondary tothrombocytopenia  CT abdomen/pelvis 01/30/2014 revealed splenomegaly and no evidence of recurrent colon cancer  CT chest 04/07/2014 with a stable right lower lobe nodule and no evidence for metastatic disease, no nodules seen on the CT 11/26/2014  Markedly elevated CEA 11/24/2014  CT 11/26/2014 revealed a right pelvic mass, splenomegaly, small volume ascites  Right salpingo-oophorectomy 12/28/2014 with the pathology confirming metastatic colon cancer  CTs 03/23/2016-no evidence of recurrent or metastatic disease.  CT 11/26/2016-enlargement of a fluid density structure the right pelvic sidewall, no other evidence of metastatic disease  CT aspiration right pelvic cyst 12/19/2016.Cytology-BENIGN REACTIVE/REPARATIVE CHANGES.  CTs 06/05/2017-no evidence of metastatic disease, mild cirrhotic changes with splenomegaly  CTs 12/11/2017- recurrent cystic right adnexal mass, similar to on the CT 11/26/2016, stable mild splenomegaly 2. Mild elevation of the CEA beginning January 2016, normal on 05/19/2014   3. History of iron deficiency anemia  4. seizure disorder  5. history of depression  6. 4 mm right lower lobe nodule on a staging chest CT 09/08/2013 , stable on a CT 04/07/2014 7. Hospitalization 09/24/2013 through 09/26/2013 with fever and abdominal pain.  8. 09/24/2013 urine culture positive for coag negative staph.  9. 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 Splenomegaly noted on a CT scan 01/30/2014,persistent on repeat CTs    Disposition: [name] remains in clinical remission from colon cancer.  The CEA has increased in the normal range over the past few months.  We will follow-up on the CEA from today.  If the CEA is higher  as compared to 2 months ago we will refer her for CTs.  Otherwise we will see her back with a  CEA and follow-up visit in 4 months.  She will contact the office in the interim with any problems.  Plan reviewed with Dr. Benay Spice.     Ned Card ANP/GNP-BC   04/18/2018  10:34 AM

## 2018-04-21 ENCOUNTER — Telehealth: Payer: Self-pay

## 2018-04-21 ENCOUNTER — Other Ambulatory Visit: Payer: Self-pay | Admitting: Nurse Practitioner

## 2018-04-21 DIAGNOSIS — C182 Malignant neoplasm of ascending colon: Secondary | ICD-10-CM

## 2018-04-21 NOTE — Telephone Encounter (Signed)
Voicemail left for patient to call back [facility]

## 2018-04-21 NOTE — Telephone Encounter (Signed)
TC to pt per Lattie Haw to let her know the CEA is higher in the normal range and that Dr. Benay Spice would like for her to have CT scans. I made appointment for CT to be done on Wednesday 04/23/18 @ 8am. I let her know to arrive to CT by 7:45am and to be NPO and that she can come to the cancer center to pick up her contrast. She also has her follow-up visit with Dr. Benay Spice Monday 04/28/18 at 9am to review the results. Pt made aware of all appointment dates and times. No further problems or concerns at this time.

## 2018-04-22 ENCOUNTER — Other Ambulatory Visit: Payer: Self-pay

## 2018-04-22 ENCOUNTER — Inpatient Hospital Stay: Payer: Self-pay

## 2018-04-22 DIAGNOSIS — C182 Malignant neoplasm of ascending colon: Secondary | ICD-10-CM

## 2018-04-22 LAB — CMP (CANCER CENTER ONLY)
ALK PHOS: 71 U/L (ref 38–126)
ALT: 37 U/L (ref 0–44)
ANION GAP: 9 (ref 5–15)
AST: 26 U/L (ref 15–41)
Albumin: 4 g/dL (ref 3.5–5.0)
BUN: 14 mg/dL (ref 6–20)
CALCIUM: 9.1 mg/dL (ref 8.9–10.3)
CO2: 26 mmol/L (ref 22–32)
CREATININE: 0.79 mg/dL (ref 0.44–1.00)
Chloride: 107 mmol/L (ref 98–111)
GFR, Est AFR Am: >60 mL/min (ref >60)
GFR, Estimated: >60 mL/min (ref >60)
GLUCOSE: 121 mg/dL — AB (ref 70–99)
Potassium: 4.3 mmol/L (ref 3.5–5.1)
SODIUM: 142 mmol/L (ref 135–145)
Total Bilirubin: 0.7 mg/dL (ref 0.3–1.2)
Total Protein: 7.2 g/dL (ref 6.5–8.1)

## 2018-04-23 ENCOUNTER — Ambulatory Visit (HOSPITAL_COMMUNITY)
Admission: RE | Admit: 2018-04-23 | Discharge: 2018-04-23 | Disposition: A | Discharge status: Home / Self Care | Payer: Self-pay | Source: Ambulatory Visit | Attending: Nurse Practitioner | Admitting: Nurse Practitioner

## 2018-04-23 DIAGNOSIS — C182 Malignant neoplasm of ascending colon: Secondary | ICD-10-CM | POA: Insufficient documentation

## 2018-04-23 MED ORDER — SODIUM CHLORIDE (PF) 0.9 % IJ SOLN
INTRAMUSCULAR | Status: AC
  Filled 2018-04-23: qty 50

## 2018-04-23 MED ORDER — IOHEXOL 300 MG/ML  SOLN
100.0000 mL | Freq: Once | INTRAMUSCULAR | Status: AC | PRN
  Administered 2018-04-23: 100 mL via INTRAVENOUS

## 2018-04-28 ENCOUNTER — Inpatient Hospital Stay (HOSPITAL_BASED_OUTPATIENT_CLINIC_OR_DEPARTMENT_OTHER): Payer: Self-pay | Admitting: Oncology

## 2018-04-28 ENCOUNTER — Other Ambulatory Visit: Payer: Self-pay

## 2018-04-28 VITALS — BP 124/86 | HR 67 | Temp 97.8°F | Resp 17 | Ht 66.0 in | Wt 176.4 lb

## 2018-04-28 DIAGNOSIS — Z85038 Personal history of other malignant neoplasm of large intestine: Secondary | ICD-10-CM

## 2018-04-28 DIAGNOSIS — C182 Malignant neoplasm of ascending colon: Secondary | ICD-10-CM

## 2018-04-28 DIAGNOSIS — R1907 Generalized intra-abdominal and pelvic swelling, mass and lump: Secondary | ICD-10-CM

## 2018-04-28 NOTE — Progress Notes (Signed)
[place] OFFICE PROGRESS NOTE   Diagnosis: Colon cancer  INTERVAL HISTORY:   [name] reports feeling well.  No abdominal pain.  Her only complaint is weight gain.  The CEA was slightly higher when she was here on 04/18/2018. She was referred for CTs of the chest, abdomen and pelvis on 04/23/2018.  The spleen is at the upper limit of normal in size.  A 2.6 x 3.2 x 5.1 cm cystic right adnexal mass has enhancing mural nodularity.  This lesion previously measured 3 cm.  Objective:  Vital signs in last 24 hours:  Blood pressure 124/86, pulse 67, temperature 97.8 F (36.6 C), temperature source Oral, resp. rate 17, height 5' 6"  (1.676 m), weight 176 lb 6.4 oz (80 kg), SpO2 100 %.    Physical examination-not performed today Lab Results:  Lab Results  Component Value Date   WBC 4.6 12/19/2016   HGB 13.3 12/19/2016   HCT 38.7 12/19/2016   MCV 82.5 12/19/2016   PLT 169 12/19/2016   NEUTROABS 3.1 12/20/2014    Imaging:  No results found.  Medications: I have reviewed the patient's current medications.  Assessment/Plan: 1. 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  The tumor returned microsatellite stable with no loss of mismatch repair protein expression   APC mutated. No BRAF, KRAS, or NRAS mutation On Foundation 1 testing   Cycle 1 adjuvant FOLFOX 09/08/2013   Cycle 2 adjuvant FOLFOX 09/24/2013   Cycle 3 adjuvant FOLFOX 10/08/2013.   Cycle 4 adjuvant FOLFOX 10/22/2013.   Cycle 5 adjuvant FOLFOX 11/05/2013. Oxaliplatin held due to thrombocytopenia.  Cycle 6 FOLFOX 11/19/2013.  Cycle 7 FOLFOX 12/03/2013. Oxaliplatin held secondary to thrombocytopenia.  Cycle 8 FOLFOX 12/17/2013.  Cycle 9 FOLFOX 01/04/2014. Oxaliplatin held secondary to neutropenia.   Cycle 10 FOLFOX 01/21/2014. Oxaliplatin held secondary to thrombocytopenia.  Cycle 11  FOLFOX 02/04/2014  Cycle 12 FOLFOX 02/18/2014, oxaliplatin dose reduced secondary tothrombocytopenia  CT abdomen/pelvis 01/30/2014 revealed splenomegaly and no evidence of recurrent colon cancer  CT chest 04/07/2014 with a stable right lower lobe nodule and no evidence for metastatic disease, no nodules seen on the CT 11/26/2014  Markedly elevated CEA 11/24/2014  CT 11/26/2014 revealed a right pelvic mass, splenomegaly, small volume ascites  Right salpingo-oophorectomy 12/28/2014 with the pathology confirming metastatic colon cancer  CTs 03/23/2016-no evidence of recurrent or metastatic disease.  CT 11/26/2016-enlargement of a fluid density structure the right pelvic sidewall, no other evidence of metastatic disease  CT aspiration right pelvic cyst 12/19/2016.Cytology-BENIGN REACTIVE/REPARATIVE CHANGES.  CTs 06/05/2017-no evidence of metastatic disease, mild cirrhotic changes with splenomegaly  CTs 12/11/2017- recurrent cystic right adnexal mass, similar to on the CT 11/26/2016, stable mild splenomegaly  CTs 04/23/2018- enlargement of cystic right adnexal mass with mural nodularity, no other evidence of metastatic disease 2. Mild elevation of the CEA beginning January 2016, normal on 05/19/2014   3. History of iron deficiency anemia  4. seizure disorder  5. history of depression  6. 4 mm right lower lobe nodule on a staging chest CT 09/08/2013 , stable on a CT 04/07/2014 7. Hospitalization 09/24/2013 through 09/26/2013 with fever and abdominal pain.  8. 09/24/2013 urine culture positive for coag negative staph.  9. 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 Splenomegaly noted on a CT scan 01/30/2014,persistent on repeat CTs     Disposition: [name] appears well.  The CEA is  slightly higher and remains in the normal range.  The restaging CT reveals enlargement of the cystic right  pelvic mass.  The lesion has been previously aspirated with benign cytology.  She understands this lesion may represent persistence/progression of metastatic colon cancer.  We discussed treatment options.  She is asymptomatic.  We discussed a referral to Dr. Clovis Riley to consider cytoreductive surgery and intraperitoneal chemotherapy.  There are many systemic treatment options.  She will be referred for a staging PET scan and return for an office visit in approximately 6 weeks.  I reviewed the 04/23/2018 CT images.  Betsy Coder, MD  04/28/2018  1:38 PM

## 2018-04-29 ENCOUNTER — Telehealth: Payer: Self-pay | Admitting: Oncology

## 2018-04-29 NOTE — Telephone Encounter (Signed)
Spoke with patient re May appointments. Central radiology will call re scan. Also spoke with patient re telephone call visit from nutritionist 3/30 @ 1:45 pm.

## 2018-05-05 ENCOUNTER — Telehealth: Payer: Self-pay | Admitting: Nutrition

## 2018-05-05 ENCOUNTER — Inpatient Hospital Stay: Payer: Self-pay | Admitting: Nutrition

## 2018-05-05 NOTE — Telephone Encounter (Signed)
RD working remotely.  Contacted patient by phone but was unable to reach her. Left her a message to call me back and provided a contact information.

## 2018-05-21 ENCOUNTER — Other Ambulatory Visit: Payer: Self-pay

## 2018-05-21 ENCOUNTER — Encounter (HOSPITAL_COMMUNITY)
Admission: RE | Admit: 2018-05-21 | Discharge: 2018-05-21 | Disposition: A | Discharge status: Home / Self Care | Payer: Self-pay | Source: Ambulatory Visit | Attending: Oncology | Admitting: Oncology

## 2018-05-21 DIAGNOSIS — C182 Malignant neoplasm of ascending colon: Secondary | ICD-10-CM | POA: Insufficient documentation

## 2018-05-21 LAB — GLUCOSE, CAPILLARY: Glucose-Capillary: 114 mg/dL — ABNORMAL HIGH (ref 70–99)

## 2018-05-21 MED ORDER — FLUDEOXYGLUCOSE F - 18 (FDG) INJECTION
9.7800 | Freq: Once | INTRAVENOUS | Status: AC | PRN
  Administered 2018-05-21: 9.78 via INTRAVENOUS

## 2018-05-23 ENCOUNTER — Other Ambulatory Visit: Payer: Self-pay | Admitting: Oncology

## 2018-05-23 DIAGNOSIS — C182 Malignant neoplasm of ascending colon: Secondary | ICD-10-CM

## 2018-05-27 ENCOUNTER — Telehealth: Payer: Self-pay | Admitting: Oncology

## 2018-05-27 NOTE — Telephone Encounter (Signed)
Faxed records to Dr. Clovis Riley

## 2018-05-29 ENCOUNTER — Ambulatory Visit (HOSPITAL_COMMUNITY): Admission: RE | Admit: 2018-05-29 | Payer: Self-pay | Source: Ambulatory Visit

## 2018-06-09 ENCOUNTER — Inpatient Hospital Stay: Payer: Self-pay | Attending: Oncology

## 2018-06-09 ENCOUNTER — Other Ambulatory Visit: Payer: Self-pay

## 2018-06-09 DIAGNOSIS — C182 Malignant neoplasm of ascending colon: Secondary | ICD-10-CM

## 2018-06-09 DIAGNOSIS — Z85038 Personal history of other malignant neoplasm of large intestine: Secondary | ICD-10-CM | POA: Insufficient documentation

## 2018-06-09 DIAGNOSIS — R1907 Generalized intra-abdominal and pelvic swelling, mass and lump: Secondary | ICD-10-CM | POA: Insufficient documentation

## 2018-06-09 LAB — CEA (IN HOUSE-CHCC): CEA (CHCC-In House): 8.34 ng/mL — ABNORMAL HIGH (ref 0.00–5.00)

## 2018-06-10 ENCOUNTER — Telehealth: Payer: Self-pay | Admitting: Oncology

## 2018-06-10 NOTE — Telephone Encounter (Signed)
Patient on reschedule list for 5/6 and also has lab/fu 7/17. Per reschedule list reschedule 5/6 to after appointment with Dr. Clovis Riley.  Per patient she does not yet have an appointment with Dr. Clovis Riley due to she currently has not insurance and did not quality for assistance provided by office. Per patient lady in office says she will try to find another way to get her in for visit.   Secure chat to GBS/desk nurse re above. Per desk nurse schedule for June and cancel July. Appointments for May and July cancelled. Patient scheduled for lab/fu 6/26. Confirmed with patient.

## 2018-06-11 ENCOUNTER — Ambulatory Visit: Payer: Self-pay | Admitting: Oncology

## 2018-06-12 ENCOUNTER — Encounter: Payer: Self-pay | Admitting: *Deleted

## 2018-06-12 ENCOUNTER — Encounter: Payer: Self-pay | Admitting: General Practice

## 2018-06-12 ENCOUNTER — Encounter: Payer: Self-pay | Admitting: Oncology

## 2018-06-12 NOTE — Progress Notes (Signed)
Called patient to provide this information. She states she will try it and see what happens.

## 2018-06-12 NOTE — Progress Notes (Signed)
Received message regarding patient qualifying for Medicaid.  Replied back patient would have to apply for Medicaid through DSS in her county([place]) by phone, person or online. They have 90 days to make a decision.

## 2018-06-12 NOTE — Progress Notes (Signed)
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 Clovis Riley that she is not able to come to [facility] for her needed surgery. They require a down payment prior to visit and she is not able to afford this and has no insurance. Asking if she would qualify for Medicaid. Sent message to financial advocate and CSW to assist if possible.

## 2018-06-12 NOTE — Progress Notes (Signed)
[place] CSW Progress Notes  Patient in need of surgical consult, MD requests information on whether patient is eligible for Medicaid.  CSW referred to [place] resource for gaining access to primary care, specialty referrals, dental care and medications for uninsured [facility] residents.  This agency will link her w PCP and assist w any available financial assistance options.  Patient intends to return to work, thus referral to [facility] for disability application is not appropriate at this time.  Patient was sent online application for Medicaid that she can complete if desired.  Edwyna Shell, LCSW Clinical Social Worker Phone:  [phone]

## 2018-06-16 ENCOUNTER — Telehealth: Payer: Self-pay | Admitting: Physician Assistant

## 2018-06-16 ENCOUNTER — Telehealth: Payer: Self-pay | Admitting: *Deleted

## 2018-06-16 NOTE — Telephone Encounter (Signed)
Asking for update on status of the insurance/Medicaid issue? Dr. Clovis Riley agrees to see her, but she can't afford to pay. Requests return call.

## 2018-06-18 ENCOUNTER — Encounter: Payer: Self-pay | Admitting: Physician Assistant

## 2018-06-18 ENCOUNTER — Other Ambulatory Visit (HOSPITAL_COMMUNITY)
Admission: RE | Admit: 2018-06-18 | Discharge: 2018-06-18 | Disposition: A | Discharge status: Home / Self Care | Payer: Self-pay | Source: Ambulatory Visit | Attending: Physician Assistant | Admitting: Physician Assistant

## 2018-06-18 ENCOUNTER — Other Ambulatory Visit: Payer: Self-pay

## 2018-06-18 ENCOUNTER — Ambulatory Visit: Payer: Self-pay | Admitting: Physician Assistant

## 2018-06-18 VITALS — BP 122/86 | HR 68 | Temp 97.7°F

## 2018-06-18 DIAGNOSIS — Z7689 Persons encountering health services in other specified circumstances: Secondary | ICD-10-CM

## 2018-06-18 DIAGNOSIS — Z131 Encounter for screening for diabetes mellitus: Secondary | ICD-10-CM

## 2018-06-18 DIAGNOSIS — C182 Malignant neoplasm of ascending colon: Secondary | ICD-10-CM

## 2018-06-18 DIAGNOSIS — R739 Hyperglycemia, unspecified: Secondary | ICD-10-CM | POA: Insufficient documentation

## 2018-06-18 LAB — COMPREHENSIVE METABOLIC PANEL
ALT: 73 U/L — ABNORMAL HIGH (ref 0–44)
AST: 47 U/L — ABNORMAL HIGH (ref 15–41)
Albumin: 4.2 g/dL (ref 3.5–5.0)
Alkaline Phosphatase: 66 U/L (ref 38–126)
Anion gap: 11 (ref 5–15)
BUN: 18 mg/dL (ref 6–20)
CO2: 27 mmol/L (ref 22–32)
Calcium: 9.3 mg/dL (ref 8.9–10.3)
Chloride: 103 mmol/L (ref 98–111)
Creatinine, Ser: 0.82 mg/dL (ref 0.44–1.00)
GFR calc Af Amer: >60 mL/min (ref >60)
GFR calc non Af Amer: >60 mL/min (ref >60)
Glucose, Bld: 158 mg/dL — ABNORMAL HIGH (ref 70–99)
Potassium: 4.1 mmol/L (ref 3.5–5.1)
Sodium: 141 mmol/L (ref 135–145)
Total Bilirubin: 1.1 mg/dL (ref 0.3–1.2)
Total Protein: 7.2 g/dL (ref 6.5–8.1)

## 2018-06-18 LAB — HEMOGLOBIN A1C
Hgb A1c MFr Bld: 5.3 % (ref 4.8–5.6)
Mean Plasma Glucose: 105.41 mg/dL

## 2018-06-18 LAB — GLUCOSE, POCT (MANUAL RESULT ENTRY): POC Glucose: 196 mg/dl — AB (ref 70–99)

## 2018-06-18 NOTE — Progress Notes (Signed)
BP 122/86   Pulse 68   Temp 97.7 F (36.5 C)   SpO2 98%    Subjective:    Patient ID: [name], female    DOB: 1976/01/23, 43 y.o.   MRN: [id number]  HPI: [name] is a 43 y.o. female presenting on 06/18/2018 for No chief complaint on file.   HPI    Pt is here to establish care / new pt visit  Pt does not have insurance.  Pt says she no longer qualiefies for [facility] financial care because she recently got married.    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  She has not yet updated her her ID  Pt works at [employer].  She has been out of work for 2 months due to [place]  Pt was diagnosed with colon cancer in 2015 and she underwent partial colectomy.  Her main oncologist is dr Benay Spice.  He wants to send her to Dr Clovis Riley at [facility] but pt says she doesn't qualify for financial assistance there either.    Pt says she spoke with financial counselor at [facility] about her denial for assistance now that she is married.  She did apply for medicaid but hasn't heard decision on that yet.   Pt says she is doing well.  She has no complaints today.    Relevant past medical, surgical, family and social history reviewed and updated as indicated. Interim medical history since our last visit reviewed. Allergies and medications reviewed and updated.    No current outpatient medications on file.      Review of Systems  Per HPI unless specifically indicated above     Objective:    BP 122/86   Pulse 68   Temp 97.7 F (36.5 C)   SpO2 98%   Wt Readings from Last 3 Encounters:  04/28/18 176 lb 6.4 oz (80 kg)  04/18/18 172 lb 14.4 oz (78.4 kg)  01/14/18 171 lb (77.6 kg)    Physical Exam Vitals signs reviewed.  Constitutional:      General: She is not in acute distress.    Appearance: She is not ill-appearing.  HENT:     Head: Normocephalic and atraumatic.  Eyes:     Extraocular Movements: Extraocular movements intact.     Conjunctiva/sclera: Conjunctivae normal.      Pupils: Pupils are equal, round, and reactive to light.  Neck:     Musculoskeletal: Neck supple. No neck rigidity or muscular tenderness.  Cardiovascular:     Rate and Rhythm: Normal rate and regular rhythm.  Pulmonary:     Effort: Pulmonary effort is normal. No respiratory distress.     Breath sounds: Normal breath sounds.  Abdominal:     General: There is no distension.     Palpations: Abdomen is soft. There is no mass.     Tenderness: There is no abdominal tenderness.  Musculoskeletal:     Right lower leg: No edema.     Left lower leg: No edema.  Skin:    General: Skin is warm and dry.  Neurological:     Mental Status: She is alert and oriented to person, place, and time.     Gait: Gait normal.  Psychiatric:        Mood and Affect: Mood normal.        Behavior: Behavior normal.      Random bs 197        Assessment & Plan:   Encounter Diagnoses  Name Primary?  Marland Kitchen  Encounter to establish care Yes  . Hyperglycemia   . Screening for diabetes mellitus   . Cancer of ascending colon s/p right colectomy 08/11/2013      -Will check A1c, cmp due to repeatedly elevated random blood sugar tests and both of her parents having diabetes -will put pt on list for Screening mammogram -will schedule telemedicine visit in 2 weeks to review labs

## 2018-06-26 ENCOUNTER — Encounter: Payer: Self-pay | Admitting: General Practice

## 2018-06-26 ENCOUNTER — Other Ambulatory Visit: Payer: Self-pay | Admitting: Physician Assistant

## 2018-06-26 DIAGNOSIS — Z1239 Encounter for other screening for malignant neoplasm of breast: Secondary | ICD-10-CM

## 2018-06-26 NOTE — Progress Notes (Signed)
[place] CSW Progress Notes  Called patient, states that she applied for Medicaid and was denied "because I made too much money."  Current unemployment and husband's income make them over guidelines.  Has applied for financial assistance at [facility] and was denied for financial help due to being over income now that she is married.  Has spoken w [facility] and is now set up w [facility], has had appt w them recently.  Patient was referred to [facility] for surgery but cannot access care at [facility] due to lack of insurance and inability to pay required upfront costs of seeing provider at [facility].  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  It is unclear whether she can sign up at this time, she has declined insurance in the past due to the deductible and premium cost.    CSW will talk with [place] to determine if medical bills were factored into income assessment for Medicaid eligibility.  Will talk to [facility] to determine if they can help patient be assessed for financial help for referred needed to get care at [facility].  Will ask Duanne Limerick to determine if they can assist w upfront cost needed for first visit.  Will speak w MD/RN to determine if referral to another health system might allow patient to access care and financial assistance.    Edwyna Shell, LCSW Clinical Social Worker Phone:  [phone]

## 2018-07-02 ENCOUNTER — Encounter: Payer: Self-pay | Admitting: Physician Assistant

## 2018-07-02 ENCOUNTER — Ambulatory Visit: Payer: Self-pay | Admitting: Physician Assistant

## 2018-07-02 DIAGNOSIS — R7989 Other specified abnormal findings of blood chemistry: Secondary | ICD-10-CM

## 2018-07-02 DIAGNOSIS — C182 Malignant neoplasm of ascending colon: Secondary | ICD-10-CM

## 2018-07-02 NOTE — Progress Notes (Signed)
There were no vitals taken for this visit.   Subjective:    Patient ID: [name], female    DOB: 22-Aug-1975, 43 y.o.   MRN: [id number]  HPI: [name] is a 43 y.o. female presenting on 07/02/2018 for No chief complaint on file.   HPI   This is a telemedicine appointment through [employer] due to coronavirus pandemic  I connected with  Aubre Quincy on 07/02/18 by a video enabled telemedicine application and verified that I am speaking with the correct person using two identifiers.   I discussed the limitations of evaluation and management by telemedicine. The patient expressed understanding and agreed to proceed.  Pt is at home.  Provider is at office/clinic  Pt says she went back to work on Saturday at [employer].  She is wearing a mask.  She says most customers are wearing a mask.    Pt says her husband went back to work on Tuesday (at [employer]).    She is Not taking any OTCs or APAP use.    She has been in contact with the financial representative at [facility] to try to get assistance.    Relevant past medical, surgical, family and social history reviewed and updated as indicated. Interim medical history since our last visit reviewed. Allergies and medications reviewed and updated.  No current outpatient medications on file.    Review of Systems  Per HPI unless specifically indicated above     Objective:    There were no vitals taken for this visit.  Wt Readings from Last 3 Encounters:  04/28/18 176 lb 6.4 oz (80 kg)  04/18/18 172 lb 14.4 oz (78.4 kg)  01/14/18 171 lb (77.6 kg)    Physical Exam Constitutional:      General: She is not in acute distress.    Appearance: She is obese. She is not ill-appearing.  HENT:     Head: Normocephalic and atraumatic.  Pulmonary:     Effort: Pulmonary effort is normal. No respiratory distress.  Neurological:     Mental Status: She is alert and oriented to person, place, and time.   Psychiatric:        Attention and Perception: Attention normal.        Mood and Affect: Mood normal.        Speech: Speech normal.        Behavior: Behavior is cooperative.     Results for orders placed or performed during the hospital encounter of 06/18/18  Hemoglobin A1c  Result Value Ref Range   Hgb A1c MFr Bld 5.3 4.8 - 5.6 %   Mean Plasma Glucose 105.41 mg/dL  Comprehensive metabolic panel  Result Value Ref Range   Sodium 141 135 - 145 mmol/L   Potassium 4.1 3.5 - 5.1 mmol/L   Chloride 103 98 - 111 mmol/L   CO2 27 22 - 32 mmol/L   Glucose, Bld 158 (H) 70 - 99 mg/dL   BUN 18 6 - 20 mg/dL   Creatinine, Ser 0.82 0.44 - 1.00 mg/dL   Calcium 9.3 8.9 - 10.3 mg/dL   Total Protein 7.2 6.5 - 8.1 g/dL   Albumin 4.2 3.5 - 5.0 g/dL   AST 47 (H) 15 - 41 U/L   ALT 73 (H) 0 - 44 U/L   Alkaline Phosphatase 66 38 - 126 U/L   Total Bilirubin 1.1 0.3 - 1.2 mg/dL   GFR calc non Af Amer >60 >60 mL/min   GFR  calc Af Amer >60 >60 mL/min   Anion gap 11 5 - 15      Assessment & Plan:    Encounter Diagnoses  Name Primary?  . Elevated LFTs Yes  . Cancer of ascending colon s/p right colectomy 08/11/2013     -reviewed labs with pt -Pt to continue with oncology for colon cancer -will Monitor LFTs -pt encouraged to continue with financial advisor for assistance -pt will follow up in 3 month with telemedicine visit.  Pt to contact office sooner prn

## 2018-07-03 ENCOUNTER — Encounter: Payer: Self-pay | Admitting: General Practice

## 2018-07-03 NOTE — Progress Notes (Signed)
[place] CSW Progress Notes  Called patient, she has returned to work and is going to try to obtain insurance through her work.  Has already applied for financial assistance at [facility] and has been denied.  CSW offered to send her financial assistance application for [facility] for her review.  She will keep providers advised of her insurance status so appointments can be scheduled whenever possible.  At present, patient states that [facility] will not schedule appointment w surgeon until she has initial deposit and insurance for any surgery planned.  Edwyna Shell, LCSW Clinical Social Worker Phone:  [phone]

## 2018-07-21 ENCOUNTER — Ambulatory Visit (HOSPITAL_COMMUNITY)
Admission: RE | Admit: 2018-07-21 | Discharge: 2018-07-21 | Disposition: A | Discharge status: Home / Self Care | Payer: Self-pay | Source: Ambulatory Visit | Attending: Physician Assistant | Admitting: Physician Assistant

## 2018-07-21 ENCOUNTER — Other Ambulatory Visit: Payer: Self-pay

## 2018-07-21 DIAGNOSIS — Z1239 Encounter for other screening for malignant neoplasm of breast: Secondary | ICD-10-CM | POA: Insufficient documentation

## 2018-07-31 ENCOUNTER — Telehealth: Payer: Self-pay

## 2018-07-31 NOTE — Telephone Encounter (Signed)
Called and left voicemail regarding pre-screening questions for appt on 6/26  

## 2018-08-01 ENCOUNTER — Inpatient Hospital Stay: Payer: BC Managed Care – PPO | Attending: Oncology | Admitting: Oncology

## 2018-08-01 ENCOUNTER — Inpatient Hospital Stay: Payer: BC Managed Care – PPO

## 2018-08-01 ENCOUNTER — Other Ambulatory Visit: Payer: Self-pay

## 2018-08-01 VITALS — BP 131/78 | HR 81 | Temp 98.9°F | Resp 18 | Ht 66.0 in | Wt 177.2 lb

## 2018-08-01 DIAGNOSIS — C182 Malignant neoplasm of ascending colon: Secondary | ICD-10-CM

## 2018-08-01 DIAGNOSIS — Z85038 Personal history of other malignant neoplasm of large intestine: Secondary | ICD-10-CM | POA: Diagnosis not present

## 2018-08-01 DIAGNOSIS — D509 Iron deficiency anemia, unspecified: Secondary | ICD-10-CM | POA: Diagnosis not present

## 2018-08-01 DIAGNOSIS — R161 Splenomegaly, not elsewhere classified: Secondary | ICD-10-CM

## 2018-08-01 DIAGNOSIS — D6959 Other secondary thrombocytopenia: Secondary | ICD-10-CM | POA: Insufficient documentation

## 2018-08-01 LAB — CEA (IN HOUSE-CHCC): CEA (CHCC-In House): 21.48 ng/mL — ABNORMAL HIGH (ref 0.00–5.00)

## 2018-08-01 NOTE — Progress Notes (Signed)
[facility] OFFICE PROGRESS NOTE   Diagnosis: Colon cancer  INTERVAL HISTORY:   [name] returns as scheduled.  She feels well.  No complaint.  She saw Dr. Clovis Riley on 07/25/2018.  She has been scheduled for cytoreductive surgery on 08/11/2018.  Objective:  Vital signs in last 24 hours:  Blood pressure 131/78, pulse 81, temperature 98.9 F (37.2 C), temperature source Oral, resp. rate 18, height 5' 6" (1.676 m), weight 177 lb 3.2 oz (80.4 kg), SpO2 100 %.    Limited physical examination secondary to distancing with the COVID pandemic Lymphatics: No cervical, supraclavicular, axillary, or inguinal nodes GI: No mass, nontender, no hepatosplenomegaly Vascular: No leg edema   Lab Results:  Lab Results  Component Value Date   WBC 4.6 12/19/2016   HGB 13.3 12/19/2016   HCT 38.7 12/19/2016   MCV 82.5 12/19/2016   PLT 169 12/19/2016   NEUTROABS 3.1 12/20/2014    CMP  Lab Results  Component Value Date   NA 141 06/18/2018   K 4.1 06/18/2018   CL 103 06/18/2018   CO2 27 06/18/2018   GLUCOSE 158 (H) 06/18/2018   BUN 18 06/18/2018   CREATININE 0.82 06/18/2018   CALCIUM 9.3 06/18/2018   PROT 7.2 06/18/2018   ALBUMIN 4.2 06/18/2018   AST 47 (H) 06/18/2018   ALT 73 (H) 06/18/2018   ALKPHOS 66 06/18/2018   BILITOT 1.1 06/18/2018   GFRNONAA >60 06/18/2018   GFRAA >60 06/18/2018    Lab Results  Component Value Date   CEA1 21.48 (H) 08/01/2018     Medications: I have reviewed the patient's current medications.   Assessment/Plan: 1. 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  The tumor returned microsatellite stable with no loss of mismatch repair protein expression   APC mutated. No BRAF, KRAS, or NRAS mutation On Foundation 1 testing   Cycle 1 adjuvant FOLFOX 09/08/2013   Cycle 2 adjuvant FOLFOX 09/24/2013   Cycle 3 adjuvant FOLFOX 10/08/2013.    Cycle 4 adjuvant FOLFOX 10/22/2013.   Cycle 5 adjuvant FOLFOX 11/05/2013. Oxaliplatin held due to thrombocytopenia.  Cycle 6 FOLFOX 11/19/2013.  Cycle 7 FOLFOX 12/03/2013. Oxaliplatin held secondary to thrombocytopenia.  Cycle 8 FOLFOX 12/17/2013.  Cycle 9 FOLFOX 01/04/2014. Oxaliplatin held secondary to neutropenia.   Cycle 10 FOLFOX 01/21/2014. Oxaliplatin held secondary to thrombocytopenia.  Cycle 11 FOLFOX 02/04/2014  Cycle 12 FOLFOX 02/18/2014, oxaliplatin dose reduced secondary tothrombocytopenia  CT abdomen/pelvis 01/30/2014 revealed splenomegaly and no evidence of recurrent colon cancer  CT chest 04/07/2014 with a stable right lower lobe nodule and no evidence for metastatic disease, no nodules seen on the CT 11/26/2014  Markedly elevated CEA 11/24/2014  CT 11/26/2014 revealed a right pelvic mass, splenomegaly, small volume ascites  Right salpingo-oophorectomy 12/28/2014 with the pathology confirming metastatic colon cancer  CTs 03/23/2016-no evidence of recurrent or metastatic disease.  CT 11/26/2016-enlargement of a fluid density structure the right pelvic sidewall, no other evidence of metastatic disease  CT aspiration right pelvic cyst 12/19/2016.Cytology-BENIGN REACTIVE/REPARATIVE CHANGES.  CTs 06/05/2017-no evidence of metastatic disease, mild cirrhotic changes with splenomegaly  CTs 12/11/2017- recurrent cystic right adnexal mass, similar to on the CT 11/26/2016, stable mild splenomegaly  CTs 04/23/2018- enlargement of cystic right adnexal mass with mural nodularity, no other evidence of metastatic disease 2. Mild elevation of the CEA beginning January 2016, normal on 05/19/2014   3. History of iron deficiency anemia  4. seizure disorder  5. history of depression  6. 4 mm right lower lobe nodule on a staging chest CT 09/08/2013 , stable on a CT 04/07/2014 7. Hospitalization 09/24/2013 through 09/26/2013 with fever and abdominal pain.  8.  09/24/2013 urine culture positive for coag negative staph.  9. 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 Splenomegaly noted on a CT scan 01/30/2014,persistent on repeat CTs    Disposition: [name] appears unchanged.  She saw Dr. Clovis Riley and is scheduled for cytoreductive surgery on 08/11/2018.  Dr. Clovis Riley request a repeat abdomen CT and chest x-ray.  We will schedule this for 08/07/2018 and have the images forwarded to Dr. Clovis Riley.  She will return for an office visit here in 3-4 weeks.  Betsy Coder, MD  08/01/2018  11:49 AM

## 2018-08-05 ENCOUNTER — Telehealth: Payer: Self-pay | Admitting: Oncology

## 2018-08-05 NOTE — Telephone Encounter (Signed)
Called and spoke with patient. Confirmed date and time  °

## 2018-08-07 ENCOUNTER — Other Ambulatory Visit: Payer: Self-pay

## 2018-08-07 ENCOUNTER — Ambulatory Visit (HOSPITAL_COMMUNITY)
Admission: RE | Admit: 2018-08-07 | Discharge: 2018-08-07 | Disposition: A | Discharge status: Home / Self Care | Payer: BC Managed Care – PPO | Source: Ambulatory Visit | Attending: Oncology | Admitting: Oncology

## 2018-08-07 DIAGNOSIS — C182 Malignant neoplasm of ascending colon: Secondary | ICD-10-CM | POA: Diagnosis present

## 2018-08-07 MED ORDER — SODIUM CHLORIDE (PF) 0.9 % IJ SOLN
INTRAMUSCULAR | Status: AC
  Filled 2018-08-07: qty 50

## 2018-08-07 MED ORDER — IOHEXOL 300 MG/ML  SOLN
100.0000 mL | Freq: Once | INTRAMUSCULAR | Status: AC | PRN
  Administered 2018-08-07: 100 mL via INTRAVENOUS

## 2018-08-20 MED ORDER — ACETAMINOPHEN 500 MG PO TABS
1000.00 | ORAL_TABLET | ORAL | Status: DC
Start: ? — End: 2018-08-20

## 2018-08-20 MED ORDER — ONDANSETRON HCL 4 MG/2ML IJ SOLN
4.00 | INTRAMUSCULAR | Status: DC
Start: ? — End: 2018-08-20

## 2018-08-20 MED ORDER — DEXTROSE-NACL 5-0.45 % IV SOLN
INTRAVENOUS | Status: DC
Start: ? — End: 2018-08-20

## 2018-08-20 MED ORDER — PHENOL 1.4 % MT LIQD
1.00 | OROMUCOSAL | Status: DC
Start: ? — End: 2018-08-20

## 2018-08-20 MED ORDER — FILGRASTIM-SNDZ 300 MCG/0.5ML IJ SOSY
300.00 | PREFILLED_SYRINGE | INTRAMUSCULAR | Status: DC
Start: 2018-08-20 — End: 2018-08-20

## 2018-08-20 MED ORDER — ENOXAPARIN SODIUM 40 MG/0.4ML ~~LOC~~ SOLN
40.00 | SUBCUTANEOUS | Status: DC
Start: 2018-08-21 — End: 2018-08-20

## 2018-08-20 MED ORDER — DIPHENHYDRAMINE HCL 25 MG PO CAPS
25.00 | ORAL_CAPSULE | ORAL | Status: DC
Start: ? — End: 2018-08-20

## 2018-08-20 MED ORDER — POLYETHYLENE GLYCOL 3350 17 G PO PACK
17.00 | PACK | ORAL | Status: DC
Start: 2018-08-21 — End: 2018-08-20

## 2018-08-20 MED ORDER — OXYCODONE HCL 5 MG PO TABS
5.00 | ORAL_TABLET | ORAL | Status: DC
Start: ? — End: 2018-08-20

## 2018-08-22 ENCOUNTER — Other Ambulatory Visit: Payer: Self-pay

## 2018-08-22 ENCOUNTER — Ambulatory Visit: Payer: Self-pay | Admitting: Oncology

## 2018-09-01 ENCOUNTER — Ambulatory Visit: Payer: BC Managed Care – PPO | Admitting: Nurse Practitioner

## 2018-09-05 ENCOUNTER — Telehealth: Payer: Self-pay | Admitting: *Deleted

## 2018-09-05 NOTE — Telephone Encounter (Signed)
VM from resident at [facility] reporting case discussion in tumor board today. Wanted Dr. Benay Spice aware she had cytoreductive surgery without HIPEC. Call with any questions.

## 2018-09-08 ENCOUNTER — Other Ambulatory Visit: Payer: Self-pay

## 2018-09-08 ENCOUNTER — Encounter: Payer: Self-pay | Admitting: Nurse Practitioner

## 2018-09-08 ENCOUNTER — Inpatient Hospital Stay: Payer: BC Managed Care – PPO

## 2018-09-08 ENCOUNTER — Telehealth: Payer: Self-pay | Admitting: Oncology

## 2018-09-08 ENCOUNTER — Inpatient Hospital Stay: Payer: BC Managed Care – PPO | Attending: Oncology | Admitting: Nurse Practitioner

## 2018-09-08 ENCOUNTER — Other Ambulatory Visit: Payer: Self-pay | Admitting: Lab

## 2018-09-08 VITALS — BP 116/80 | HR 100 | Temp 98.3°F | Resp 18 | Ht 66.0 in | Wt 164.3 lb

## 2018-09-08 DIAGNOSIS — D6959 Other secondary thrombocytopenia: Secondary | ICD-10-CM | POA: Diagnosis not present

## 2018-09-08 DIAGNOSIS — D509 Iron deficiency anemia, unspecified: Secondary | ICD-10-CM | POA: Diagnosis not present

## 2018-09-08 DIAGNOSIS — C182 Malignant neoplasm of ascending colon: Secondary | ICD-10-CM

## 2018-09-08 DIAGNOSIS — C7961 Secondary malignant neoplasm of right ovary: Secondary | ICD-10-CM | POA: Insufficient documentation

## 2018-09-08 LAB — CBC WITH DIFFERENTIAL (CANCER CENTER ONLY)
Abs Immature Granulocytes: 0.04 10*3/uL (ref 0.00–0.07)
Basophils Absolute: 0 10*3/uL (ref 0.0–0.1)
Basophils Relative: 0 %
Eosinophils Absolute: 0.2 10*3/uL (ref 0.0–0.5)
Eosinophils Relative: 4 %
HCT: 26.9 % — ABNORMAL LOW (ref 36.0–46.0)
Hemoglobin: 8.5 g/dL — ABNORMAL LOW (ref 12.0–15.0)
Immature Granulocytes: 1 %
Lymphocytes Relative: 15 %
Lymphs Abs: 0.8 10*3/uL (ref 0.7–4.0)
MCH: 26.6 pg (ref 26.0–34.0)
MCHC: 31.6 g/dL (ref 30.0–36.0)
MCV: 84.1 fL (ref 80.0–100.0)
Monocytes Absolute: 0.4 10*3/uL (ref 0.1–1.0)
Monocytes Relative: 7 %
Neutro Abs: 3.8 10*3/uL (ref 1.7–7.7)
Neutrophils Relative %: 73 %
Platelet Count: 237 10*3/uL (ref 150–400)
RBC: 3.2 MIL/uL — ABNORMAL LOW (ref 3.87–5.11)
RDW: 17 % — ABNORMAL HIGH (ref 11.5–15.5)
WBC Count: 5.2 10*3/uL (ref 4.0–10.5)
nRBC: 0 % (ref 0.0–0.2)

## 2018-09-08 LAB — CMP (CANCER CENTER ONLY)
ALT: 11 U/L (ref 0–44)
AST: 12 U/L — ABNORMAL LOW (ref 15–41)
Albumin: 3 g/dL — ABNORMAL LOW (ref 3.5–5.0)
Alkaline Phosphatase: 65 U/L (ref 38–126)
Anion gap: 10 (ref 5–15)
BUN: 8 mg/dL (ref 6–20)
CO2: 27 mmol/L (ref 22–32)
Calcium: 9.3 mg/dL (ref 8.9–10.3)
Chloride: 99 mmol/L (ref 98–111)
Creatinine: 0.71 mg/dL (ref 0.44–1.00)
GFR, Est AFR Am: >60 mL/min (ref >60)
GFR, Estimated: >60 mL/min (ref >60)
Glucose, Bld: 177 mg/dL — ABNORMAL HIGH (ref 70–99)
Potassium: 3.7 mmol/L (ref 3.5–5.1)
Sodium: 136 mmol/L (ref 135–145)
Total Bilirubin: 0.8 mg/dL (ref 0.3–1.2)
Total Protein: 6.9 g/dL (ref 6.5–8.1)

## 2018-09-08 LAB — SAMPLE TO BLOOD BANK

## 2018-09-08 NOTE — Progress Notes (Addendum)
[facility] OFFICE PROGRESS NOTE   Diagnosis: Colon cancer  INTERVAL HISTORY:   [name] returns as scheduled.  She underwent cytoreductive surgery by Dr. Clovis Riley on 08/12/2018.  R1 resection achieved.  Cytoreduction included an omentectomy, LAR, right salpingo-oophorectomy and left colonic gutter/pelvic stripping.  Pathology on the rectum showed recurrent/metastatic adenocarcinoma, tumor 2.0 cm, predominantly involving the subserosa and muscularis propria of the colon, proximal and distal margins of resection were negative, vascular invasion present, metastatic carcinoma present in 1 out of 5 lymph nodes; omentum resection with no malignancy seen, no metastatic carcinoma identified in 1 lymph node examined; left gutter stripping positive metastatic adenocarcinoma; right ovary resection positive metastatic adenocarcinoma.  Appetite varies.  She eats small portions.  She had diarrhea this morning.  She attributes this to eating "shrimp" yesterday.  Mild associated nausea which is better now.  Abdominal pain continues to improve.  She notes that she fatigues easily.  Objective:  Vital signs in last 24 hours:  Blood pressure 116/80, pulse 100, temperature 98.3 F (36.8 C), temperature source Oral, resp. rate 18, height 5' 6"  (1.676 m), weight 164 lb 4.8 oz (74.5 kg), SpO2 100 %.    GI: Abdomen is soft with mild generalized tenderness.  No hepatomegaly.  Left lower quadrant colostomy.  Midline surgical incision is healed.  Incision at the left lower abdomen has an approximate 1 cm open area at the midpoint of the incision.  No surrounding erythema. Vascular: No leg edema.  Calves soft and nontender. Neuro: Alert and oriented. Skin: No rash.   Lab Results:  Lab Results  Component Value Date   WBC 4.6 12/19/2016   HGB 13.3 12/19/2016   HCT 38.7 12/19/2016   MCV 82.5 12/19/2016   PLT 169 12/19/2016   NEUTROABS 3.1 12/20/2014    Imaging:  No results found.   Medications: I have reviewed the patient's current medications.  Assessment/Plan: 1. 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  The tumor returned microsatellite stable with no loss of mismatch repair protein expression   APC mutated. No BRAF, KRAS, or NRAS mutation On Foundation 1 testing   Cycle 1 adjuvant FOLFOX 09/08/2013   Cycle 2 adjuvant FOLFOX 09/24/2013   Cycle 3 adjuvant FOLFOX 10/08/2013.   Cycle 4 adjuvant FOLFOX 10/22/2013.   Cycle 5 adjuvant FOLFOX 11/05/2013. Oxaliplatin held due to thrombocytopenia.  Cycle 6 FOLFOX 11/19/2013.  Cycle 7 FOLFOX 12/03/2013. Oxaliplatin held secondary to thrombocytopenia.  Cycle 8 FOLFOX 12/17/2013.  Cycle 9 FOLFOX 01/04/2014. Oxaliplatin held secondary to neutropenia.   Cycle 10 FOLFOX 01/21/2014. Oxaliplatin held secondary to thrombocytopenia.  Cycle 11 FOLFOX 02/04/2014  Cycle 12 FOLFOX 02/18/2014, oxaliplatin dose reduced secondary tothrombocytopenia  CT abdomen/pelvis 01/30/2014 revealed splenomegaly and no evidence of recurrent colon cancer  CT chest 04/07/2014 with a stable right lower lobe nodule and no evidence for metastatic disease, no nodules seen on the CT 11/26/2014  Markedly elevated CEA 11/24/2014  CT 11/26/2014 revealed a right pelvic mass, splenomegaly, small volume ascites  Right salpingo-oophorectomy 12/28/2014 with the pathology confirming metastatic colon cancer  CTs 03/23/2016-no evidence of recurrent or metastatic disease.  CT 11/26/2016-enlargement of a fluid density structure the right pelvic sidewall, no other evidence of metastatic disease  CT aspiration right pelvic cyst 12/19/2016.Cytology-BENIGN REACTIVE/REPARATIVE CHANGES.  CTs 06/05/2017-no evidence of metastatic disease, mild cirrhotic changes with splenomegaly  CTs 12/11/2017- recurrent cystic right adnexal mass, similar to on  the CT 11/26/2016, stable  mild splenomegaly  CTs 04/23/2018- enlargement of cystic right adnexal mass with mural nodularity, no other evidence of metastatic disease  Cytoreductive surgery/HIPEC with mitomycin by Dr. Clovis Riley at [facility] 08/12/2018-R1 resection achieved.  Cytoreduction included omentectomy, LAR, right salpingo-oophorectomy and left colonic gutter/pelvic stripping.  Pathology on the rectum showed recurrent/metastatic adenocarcinoma, tumor 2.0 cm, predominantly involving the subserosa and muscularis propria of the colon, proximal and distal margins of resection were negative, vascular invasion present, metastatic carcinoma present in 1 out of 5 lymph nodes; omentum resection with no malignancy seen, no metastatic carcinoma identified in 1 lymph node examined; left gutter stripping positive metastatic adenocarcinoma; right ovary resection positive metastatic adenocarcinoma. 2. Mild elevation of the CEA beginning January 2016, normal on 05/19/2014   3. History of iron deficiency anemia  4. seizure disorder  5. history of depression  6. 4 mm right lower lobe nodule on a staging chest CT 09/08/2013 , stable on a CT 04/07/2014 7. Hospitalization 09/24/2013 through 09/26/2013 with fever and abdominal pain.  8. 09/24/2013 urine culture positive for coag negative staph.  9. 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 Splenomegaly noted on a CT scan 01/30/2014,persistent on repeat CTs  Disposition: [name] is recovering from the recent cytoreductive surgery.  Dr. Benay Spice reviewed the pathology with her at today's visit.  At this time Dr. Benay Spice does not feel there is an indication for chemotherapy.  In addition, she has a borderline performance status.  The plan is for observation.  [name] agrees with this plan.  We are obtaining a CBC and chemistry panel today.  She will return for a CEA and  follow-up visit in approximately 1 month.  She will contact the office in the interim with any problems.  Patient seen with Dr. Benay Spice.  25 minutes were spent face-to-face at today's visit with the majority of that time involved in counseling/coordination of care.    Ned Card ANP/GNP-BC   09/08/2018  2:10 PM This was a shared visit with Ned Card.  [name] was interviewed and examined.  She is recovering from cytoreductive surgery.  We will confirm with Dr. Clovis Riley that she received intraoperative chemotherapy.  She understands the small chance that the cytoreductive surgery will be curative.  There is a high chance of developing recurrent disease over the next few years.  We will see her back in 1 month with a repeat CEA.  There is no clear indication for adjuvant chemotherapy, but I think it would be reasonable to consider chemotherapy with the prolonged chemotherapy free interval.  She indicates she does not wish to resume chemotherapy at present.  Julieanne Manson, MD   Per Dr. Clovis Riley on 09/08/2018- she did receive intraoperative mitomycin-C

## 2018-09-08 NOTE — Telephone Encounter (Signed)
Called and spoke with patient. Confirmed date and time of appt  ° °

## 2018-09-09 DIAGNOSIS — G2581 Restless legs syndrome: Secondary | ICD-10-CM | POA: Diagnosis not present

## 2018-09-09 DIAGNOSIS — F329 Major depressive disorder, single episode, unspecified: Secondary | ICD-10-CM | POA: Diagnosis not present

## 2018-09-09 DIAGNOSIS — Z87891 Personal history of nicotine dependence: Secondary | ICD-10-CM | POA: Diagnosis not present

## 2018-09-09 DIAGNOSIS — K219 Gastro-esophageal reflux disease without esophagitis: Secondary | ICD-10-CM | POA: Diagnosis not present

## 2018-09-09 DIAGNOSIS — H9325 Central auditory processing disorder: Secondary | ICD-10-CM | POA: Diagnosis not present

## 2018-09-09 DIAGNOSIS — Z483 Aftercare following surgery for neoplasm: Secondary | ICD-10-CM | POA: Diagnosis not present

## 2018-09-09 DIAGNOSIS — Z7901 Long term (current) use of anticoagulants: Secondary | ICD-10-CM | POA: Diagnosis not present

## 2018-09-09 DIAGNOSIS — Z433 Encounter for attention to colostomy: Secondary | ICD-10-CM | POA: Diagnosis not present

## 2018-09-09 DIAGNOSIS — G40B09 Juvenile myoclonic epilepsy, not intractable, without status epilepticus: Secondary | ICD-10-CM | POA: Diagnosis not present

## 2018-09-09 DIAGNOSIS — C786 Secondary malignant neoplasm of retroperitoneum and peritoneum: Secondary | ICD-10-CM | POA: Diagnosis not present

## 2018-09-09 DIAGNOSIS — C182 Malignant neoplasm of ascending colon: Secondary | ICD-10-CM | POA: Diagnosis not present

## 2018-09-12 DIAGNOSIS — H9325 Central auditory processing disorder: Secondary | ICD-10-CM | POA: Diagnosis not present

## 2018-09-12 DIAGNOSIS — G40B09 Juvenile myoclonic epilepsy, not intractable, without status epilepticus: Secondary | ICD-10-CM | POA: Diagnosis not present

## 2018-09-12 DIAGNOSIS — F329 Major depressive disorder, single episode, unspecified: Secondary | ICD-10-CM | POA: Diagnosis not present

## 2018-09-12 DIAGNOSIS — C786 Secondary malignant neoplasm of retroperitoneum and peritoneum: Secondary | ICD-10-CM | POA: Diagnosis not present

## 2018-09-12 DIAGNOSIS — G2581 Restless legs syndrome: Secondary | ICD-10-CM | POA: Diagnosis not present

## 2018-09-12 DIAGNOSIS — Z7901 Long term (current) use of anticoagulants: Secondary | ICD-10-CM | POA: Diagnosis not present

## 2018-09-12 DIAGNOSIS — C182 Malignant neoplasm of ascending colon: Secondary | ICD-10-CM | POA: Diagnosis not present

## 2018-09-12 DIAGNOSIS — Z433 Encounter for attention to colostomy: Secondary | ICD-10-CM | POA: Diagnosis not present

## 2018-09-12 DIAGNOSIS — Z87891 Personal history of nicotine dependence: Secondary | ICD-10-CM | POA: Diagnosis not present

## 2018-09-12 DIAGNOSIS — Z483 Aftercare following surgery for neoplasm: Secondary | ICD-10-CM | POA: Diagnosis not present

## 2018-09-12 DIAGNOSIS — K219 Gastro-esophageal reflux disease without esophagitis: Secondary | ICD-10-CM | POA: Diagnosis not present

## 2018-09-16 DIAGNOSIS — F329 Major depressive disorder, single episode, unspecified: Secondary | ICD-10-CM | POA: Diagnosis not present

## 2018-09-16 DIAGNOSIS — C786 Secondary malignant neoplasm of retroperitoneum and peritoneum: Secondary | ICD-10-CM | POA: Diagnosis not present

## 2018-09-16 DIAGNOSIS — K219 Gastro-esophageal reflux disease without esophagitis: Secondary | ICD-10-CM | POA: Diagnosis not present

## 2018-09-16 DIAGNOSIS — Z87891 Personal history of nicotine dependence: Secondary | ICD-10-CM | POA: Diagnosis not present

## 2018-09-16 DIAGNOSIS — Z7901 Long term (current) use of anticoagulants: Secondary | ICD-10-CM | POA: Diagnosis not present

## 2018-09-16 DIAGNOSIS — Z433 Encounter for attention to colostomy: Secondary | ICD-10-CM | POA: Diagnosis not present

## 2018-09-16 DIAGNOSIS — G40B09 Juvenile myoclonic epilepsy, not intractable, without status epilepticus: Secondary | ICD-10-CM | POA: Diagnosis not present

## 2018-09-16 DIAGNOSIS — H9325 Central auditory processing disorder: Secondary | ICD-10-CM | POA: Diagnosis not present

## 2018-09-16 DIAGNOSIS — C182 Malignant neoplasm of ascending colon: Secondary | ICD-10-CM | POA: Diagnosis not present

## 2018-09-16 DIAGNOSIS — G2581 Restless legs syndrome: Secondary | ICD-10-CM | POA: Diagnosis not present

## 2018-09-16 DIAGNOSIS — Z483 Aftercare following surgery for neoplasm: Secondary | ICD-10-CM | POA: Diagnosis not present

## 2018-09-21 ENCOUNTER — Encounter: Payer: Self-pay | Admitting: Oncology

## 2018-09-22 ENCOUNTER — Inpatient Hospital Stay: Payer: BC Managed Care – PPO

## 2018-09-22 ENCOUNTER — Other Ambulatory Visit: Payer: BC Managed Care – PPO

## 2018-09-22 ENCOUNTER — Other Ambulatory Visit: Payer: Self-pay | Admitting: *Deleted

## 2018-09-22 ENCOUNTER — Other Ambulatory Visit: Payer: Self-pay

## 2018-09-22 DIAGNOSIS — C182 Malignant neoplasm of ascending colon: Secondary | ICD-10-CM

## 2018-09-22 DIAGNOSIS — R350 Frequency of micturition: Secondary | ICD-10-CM

## 2018-09-22 DIAGNOSIS — D509 Iron deficiency anemia, unspecified: Secondary | ICD-10-CM | POA: Diagnosis not present

## 2018-09-22 DIAGNOSIS — D6959 Other secondary thrombocytopenia: Secondary | ICD-10-CM | POA: Diagnosis not present

## 2018-09-22 DIAGNOSIS — C7961 Secondary malignant neoplasm of right ovary: Secondary | ICD-10-CM | POA: Diagnosis not present

## 2018-09-22 LAB — URINALYSIS, COMPLETE (UACMP) WITH MICROSCOPIC
Bacteria, UA: NONE SEEN
Bilirubin Urine: NEGATIVE
Glucose, UA: NEGATIVE mg/dL
Hgb urine dipstick: NEGATIVE
Ketones, ur: NEGATIVE mg/dL
Leukocytes,Ua: NEGATIVE
Nitrite: NEGATIVE
Protein, ur: 30 mg/dL — AB
Specific Gravity, Urine: 1.028 (ref 1.005–1.030)
pH: 5 (ref 5.0–8.0)

## 2018-09-22 LAB — CBC WITH DIFFERENTIAL (CANCER CENTER ONLY)
Abs Immature Granulocytes: 0.01 10*3/uL (ref 0.00–0.07)
Basophils Absolute: 0 10*3/uL (ref 0.0–0.1)
Basophils Relative: 0 %
Eosinophils Absolute: 0.1 10*3/uL (ref 0.0–0.5)
Eosinophils Relative: 2 %
HCT: 29.9 % — ABNORMAL LOW (ref 36.0–46.0)
Hemoglobin: 9.1 g/dL — ABNORMAL LOW (ref 12.0–15.0)
Immature Granulocytes: 0 %
Lymphocytes Relative: 17 %
Lymphs Abs: 0.6 10*3/uL — ABNORMAL LOW (ref 0.7–4.0)
MCH: 25.9 pg — ABNORMAL LOW (ref 26.0–34.0)
MCHC: 30.4 g/dL (ref 30.0–36.0)
MCV: 84.9 fL (ref 80.0–100.0)
Monocytes Absolute: 0.2 10*3/uL (ref 0.1–1.0)
Monocytes Relative: 5 %
Neutro Abs: 2.9 10*3/uL (ref 1.7–7.7)
Neutrophils Relative %: 76 %
Platelet Count: 242 10*3/uL (ref 150–400)
RBC: 3.52 MIL/uL — ABNORMAL LOW (ref 3.87–5.11)
RDW: 16.9 % — ABNORMAL HIGH (ref 11.5–15.5)
WBC Count: 3.8 10*3/uL — ABNORMAL LOW (ref 4.0–10.5)
nRBC: 0 % (ref 0.0–0.2)

## 2018-09-22 LAB — SAMPLE TO BLOOD BANK

## 2018-09-22 NOTE — Progress Notes (Signed)
Reports urinary frequency with minimal output at each void. Asking for U/A today when she comes in for labs.

## 2018-09-23 ENCOUNTER — Telehealth: Payer: Self-pay | Admitting: *Deleted

## 2018-09-23 DIAGNOSIS — G40B09 Juvenile myoclonic epilepsy, not intractable, without status epilepticus: Secondary | ICD-10-CM | POA: Diagnosis not present

## 2018-09-23 DIAGNOSIS — G2581 Restless legs syndrome: Secondary | ICD-10-CM | POA: Diagnosis not present

## 2018-09-23 DIAGNOSIS — Z7901 Long term (current) use of anticoagulants: Secondary | ICD-10-CM | POA: Diagnosis not present

## 2018-09-23 DIAGNOSIS — K219 Gastro-esophageal reflux disease without esophagitis: Secondary | ICD-10-CM | POA: Diagnosis not present

## 2018-09-23 DIAGNOSIS — Z483 Aftercare following surgery for neoplasm: Secondary | ICD-10-CM | POA: Diagnosis not present

## 2018-09-23 DIAGNOSIS — Z433 Encounter for attention to colostomy: Secondary | ICD-10-CM | POA: Diagnosis not present

## 2018-09-23 DIAGNOSIS — C786 Secondary malignant neoplasm of retroperitoneum and peritoneum: Secondary | ICD-10-CM | POA: Diagnosis not present

## 2018-09-23 DIAGNOSIS — C182 Malignant neoplasm of ascending colon: Secondary | ICD-10-CM | POA: Diagnosis not present

## 2018-09-23 DIAGNOSIS — Z87891 Personal history of nicotine dependence: Secondary | ICD-10-CM | POA: Diagnosis not present

## 2018-09-23 DIAGNOSIS — H9325 Central auditory processing disorder: Secondary | ICD-10-CM | POA: Diagnosis not present

## 2018-09-23 DIAGNOSIS — F329 Major depressive disorder, single episode, unspecified: Secondary | ICD-10-CM | POA: Diagnosis not present

## 2018-09-23 NOTE — Telephone Encounter (Signed)
-----   Message from Owens Shark, NP sent at 09/22/2018  6:29 PM EDT ----- Please let her know hemoglobin is better, f/u as scheduled

## 2018-09-23 NOTE — Telephone Encounter (Signed)
Notified of CBC results and that her U/A was negative for UTI. F/U as scheduled

## 2018-09-30 DIAGNOSIS — Z433 Encounter for attention to colostomy: Secondary | ICD-10-CM | POA: Diagnosis not present

## 2018-09-30 DIAGNOSIS — Z87891 Personal history of nicotine dependence: Secondary | ICD-10-CM | POA: Diagnosis not present

## 2018-09-30 DIAGNOSIS — Z7901 Long term (current) use of anticoagulants: Secondary | ICD-10-CM | POA: Diagnosis not present

## 2018-09-30 DIAGNOSIS — Z483 Aftercare following surgery for neoplasm: Secondary | ICD-10-CM | POA: Diagnosis not present

## 2018-09-30 DIAGNOSIS — G2581 Restless legs syndrome: Secondary | ICD-10-CM | POA: Diagnosis not present

## 2018-09-30 DIAGNOSIS — H9325 Central auditory processing disorder: Secondary | ICD-10-CM | POA: Diagnosis not present

## 2018-09-30 DIAGNOSIS — K219 Gastro-esophageal reflux disease without esophagitis: Secondary | ICD-10-CM | POA: Diagnosis not present

## 2018-09-30 DIAGNOSIS — C182 Malignant neoplasm of ascending colon: Secondary | ICD-10-CM | POA: Diagnosis not present

## 2018-09-30 DIAGNOSIS — C786 Secondary malignant neoplasm of retroperitoneum and peritoneum: Secondary | ICD-10-CM | POA: Diagnosis not present

## 2018-09-30 DIAGNOSIS — G40B09 Juvenile myoclonic epilepsy, not intractable, without status epilepticus: Secondary | ICD-10-CM | POA: Diagnosis not present

## 2018-09-30 DIAGNOSIS — F329 Major depressive disorder, single episode, unspecified: Secondary | ICD-10-CM | POA: Diagnosis not present

## 2018-10-07 ENCOUNTER — Ambulatory Visit: Payer: Self-pay | Admitting: Physician Assistant

## 2018-10-08 DIAGNOSIS — C182 Malignant neoplasm of ascending colon: Secondary | ICD-10-CM | POA: Diagnosis not present

## 2018-10-08 DIAGNOSIS — C786 Secondary malignant neoplasm of retroperitoneum and peritoneum: Secondary | ICD-10-CM | POA: Diagnosis not present

## 2018-10-08 DIAGNOSIS — Z933 Colostomy status: Secondary | ICD-10-CM | POA: Diagnosis not present

## 2018-10-10 ENCOUNTER — Inpatient Hospital Stay: Payer: BC Managed Care – PPO

## 2018-10-10 ENCOUNTER — Telehealth: Payer: Self-pay | Admitting: *Deleted

## 2018-10-10 ENCOUNTER — Telehealth: Payer: Self-pay | Admitting: Oncology

## 2018-10-10 ENCOUNTER — Other Ambulatory Visit: Payer: Self-pay

## 2018-10-10 ENCOUNTER — Inpatient Hospital Stay: Payer: BC Managed Care – PPO | Attending: Oncology | Admitting: Oncology

## 2018-10-10 VITALS — BP 125/81 | HR 101 | Temp 98.2°F | Resp 18 | Ht 66.0 in | Wt 150.1 lb

## 2018-10-10 DIAGNOSIS — D6959 Other secondary thrombocytopenia: Secondary | ICD-10-CM | POA: Diagnosis not present

## 2018-10-10 DIAGNOSIS — C182 Malignant neoplasm of ascending colon: Secondary | ICD-10-CM

## 2018-10-10 DIAGNOSIS — C7961 Secondary malignant neoplasm of right ovary: Secondary | ICD-10-CM | POA: Insufficient documentation

## 2018-10-10 LAB — CEA (IN HOUSE-CHCC): CEA (CHCC-In House): 3.11 ng/mL (ref 0.00–5.00)

## 2018-10-10 NOTE — Telephone Encounter (Signed)
-----   Message from [name], MD sent at 10/10/2018  3:07 PM EDT ----- Please call patient, cea is normal

## 2018-10-10 NOTE — Telephone Encounter (Signed)
Called and spoke with patient. Confirmed appt  °

## 2018-10-10 NOTE — Telephone Encounter (Signed)
Left VM with normal CEA results.

## 2018-10-10 NOTE — Progress Notes (Signed)
[name]   Diagnosis: Colon cancer  INTERVAL HISTORY:   [name] returns as scheduled.  She feels her overall condition is improving.  She is eating a regular diet.  She had apple juice earlier this week and had diarrhea after this.  The diarrhea has improved. She saw Dr. Clovis Riley on 10/08/2018.  She had a barium enema.  Dr. Clovis Riley recommends a sigmoidoscopy prior to reversing the colostomy.  [name] does not feel ready to consider chemotherapy.  Objective:  Vital signs in last 24 hours:  Blood pressure 125/81, pulse (!) 101, temperature 98.2 F (36.8 C), temperature source Oral, resp. rate 18, height 5' 6"  (1.676 m), weight 150 lb 1.6 oz (68.1 kg), SpO2 100 %.     GI: Mild diffuse tenderness, no hepatomegaly, left lower quadrant colostomy with [place] stool, healed surgical incisions Vascular: No leg edema     Lab Results:  Lab Results  Component Value Date   WBC 3.8 (L) 09/22/2018   HGB 9.1 (L) 09/22/2018   HCT 29.9 (L) 09/22/2018   MCV 84.9 09/22/2018   PLT 242 09/22/2018   NEUTROABS 2.9 09/22/2018    CMP  Lab Results  Component Value Date   NA 136 09/08/2018   K 3.7 09/08/2018   CL 99 09/08/2018   CO2 27 09/08/2018   GLUCOSE 177 (H) 09/08/2018   BUN 8 09/08/2018   CREATININE 0.71 09/08/2018   CALCIUM 9.3 09/08/2018   PROT 6.9 09/08/2018   ALBUMIN 3.0 (L) 09/08/2018   AST 12 (L) 09/08/2018   ALT 11 09/08/2018   ALKPHOS 65 09/08/2018   BILITOT 0.8 09/08/2018   GFRNONAA >60 09/08/2018   GFRAA >60 09/08/2018    Lab Results  Component Value Date   CEA1 3.11 10/10/2018       Medications: I have reviewed the patient's current medications.   Assessment/Plan: 1. 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  The tumor returned microsatellite stable with no loss of mismatch repair protein expression   APC mutated.  No BRAF, KRAS, or NRAS mutation On Foundation 1 testing   Cycle 1 adjuvant FOLFOX 09/08/2013   Cycle 2 adjuvant FOLFOX 09/24/2013   Cycle 3 adjuvant FOLFOX 10/08/2013.   Cycle 4 adjuvant FOLFOX 10/22/2013.   Cycle 5 adjuvant FOLFOX 11/05/2013. Oxaliplatin held due to thrombocytopenia.  Cycle 6 FOLFOX 11/19/2013.  Cycle 7 FOLFOX 12/03/2013. Oxaliplatin held secondary to thrombocytopenia.  Cycle 8 FOLFOX 12/17/2013.  Cycle 9 FOLFOX 01/04/2014. Oxaliplatin held secondary to neutropenia.   Cycle 10 FOLFOX 01/21/2014. Oxaliplatin held secondary to thrombocytopenia.  Cycle 11 FOLFOX 02/04/2014  Cycle 12 FOLFOX 02/18/2014, oxaliplatin dose reduced secondary tothrombocytopenia  CT abdomen/pelvis 01/30/2014 revealed splenomegaly and no evidence of recurrent colon cancer  CT chest 04/07/2014 with a stable right lower lobe nodule and no evidence for metastatic disease, no nodules seen on the CT 11/26/2014  Markedly elevated CEA 11/24/2014  CT 11/26/2014 revealed a right pelvic mass, splenomegaly, small volume ascites  Right salpingo-oophorectomy 12/28/2014 with the pathology confirming metastatic colon cancer  CTs 03/23/2016-no evidence of recurrent or metastatic disease.  CT 11/26/2016-enlargement of a fluid density structure the right pelvic sidewall, no other evidence of metastatic disease  CT aspiration right pelvic cyst 12/19/2016.Cytology-BENIGN REACTIVE/REPARATIVE CHANGES.  CTs 06/05/2017-no evidence of metastatic disease, mild cirrhotic changes with splenomegaly  CTs 12/11/2017- recurrent cystic right adnexal mass, similar to on the CT 11/26/2016, stable mild splenomegaly  CTs 04/23/2018- enlargement of cystic right adnexal mass with mural nodularity, no other evidence of metastatic disease  Cytoreductive surgery/HIPEC with mitomycin by Dr. Clovis Riley at [facility] 08/12/2018-R1 resection achieved.  Cytoreduction included omentectomy, LAR, right salpingo-oophorectomy and left  colonic gutter/pelvic stripping.  Pathology on the rectum showed recurrent/metastatic adenocarcinoma, tumor 2.0 cm, predominantly involving the subserosa and muscularis propria of the colon, proximal and distal margins of resection were negative, vascular invasion present, metastatic carcinoma present in 1 out of 5 lymph nodes; omentum resection with no malignancy seen, no metastatic carcinoma identified in 1 lymph node examined; left gutter stripping positive metastatic adenocarcinoma; right ovary resection positive metastatic adenocarcinoma. 2. Mild elevation of the CEA beginning January 2016, normal on 05/19/2014   3. History of iron deficiency anemia  4. seizure disorder  5. history of depression  6. 4 mm right lower lobe nodule on a staging chest CT 09/08/2013 , stable on a CT 04/07/2014 7. Hospitalization 09/24/2013 through 09/26/2013 with fever and abdominal pain.  8. 09/24/2013 urine culture positive for coag negative staph.  9. 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 Splenomegaly noted on a CT scan 01/30/2014,persistent on repeat CTs    Disposition: [name] continues to recover from the cytoreductive surgery.  She would like to have the colostomy reversed as soon as possible.  I will contact [name] and asked him to schedule a sigmoidoscopy.  The CEA has normalized.  Her clinical status does not appear adequate to receive chemotherapy at present.  She will return office visit and further discussion next month.  Chemotherapy will not be curative.  We will most likely recommend observation chemotherapy reserved for disease progression.  Betsy Coder, MD  10/10/2018  1:37 PM

## 2018-10-14 ENCOUNTER — Encounter: Payer: Self-pay | Admitting: Oncology

## 2018-10-14 ENCOUNTER — Encounter: Payer: Self-pay | Admitting: *Deleted

## 2018-10-17 ENCOUNTER — Telehealth: Payer: Self-pay

## 2018-10-17 NOTE — Telephone Encounter (Signed)
Pt scheduled to see [name] 11/13/18

## 2018-10-17 NOTE — Telephone Encounter (Signed)
-----   Message from Irene Shipper, MD sent at 10/11/2018  2:54 PM EDT ----- Regarding: OV No problem, Brad. I reviewed his note.  Vaughan Basta, Arrange office visit with me. Thanks  JP ----- Message ----- From: [name], MD Sent: 10/10/2018   1:39 PM EDT To: Irene Shipper, MD  Patient you have seen in the past with colon cancer, had recurrent disease in the pelvis and underwent cytoreductive surgery 2 months ago This included a rectal resection and diverting loop colostomy.  She would like to have the colostomy reversed as soon as possible.  Dr. Clovis Riley requests a sigmoidoscopy to evaluate the anastomosis (see his note in Care Everywhere from 10/08/2018) Can you see her for a sigmoidoscopy soon?   Thanks,  [employer]

## 2018-10-23 ENCOUNTER — Encounter: Payer: Self-pay | Admitting: Oncology

## 2018-10-31 ENCOUNTER — Encounter: Payer: Self-pay | Admitting: Oncology

## 2018-11-13 ENCOUNTER — Encounter: Payer: Self-pay | Admitting: Internal Medicine

## 2018-11-13 ENCOUNTER — Ambulatory Visit: Payer: BC Managed Care – PPO | Admitting: Internal Medicine

## 2018-11-13 ENCOUNTER — Telehealth: Payer: Self-pay | Admitting: Nurse Practitioner

## 2018-11-13 VITALS — BP 122/70 | HR 88 | Ht 66.0 in | Wt 147.0 lb

## 2018-11-13 DIAGNOSIS — Z01818 Encounter for other preprocedural examination: Secondary | ICD-10-CM

## 2018-11-13 DIAGNOSIS — Z85038 Personal history of other malignant neoplasm of large intestine: Secondary | ICD-10-CM

## 2018-11-13 NOTE — Telephone Encounter (Signed)
Returned patient's phone call regarding rescheduling 10/12 due to scheduling conflict, per request appointment has moved to 10/16.

## 2018-11-13 NOTE — Patient Instructions (Signed)
You have been scheduled for a colonoscopy. Please follow written instructions given to you at your visit today.    

## 2018-11-13 NOTE — Progress Notes (Signed)
HISTORY OF PRESENT ILLNESS:  [name] is a 43 y.o. female with history of colon cancer status post right hemicolectomy 2015.  I last saw her December 2019 for surveillance colonoscopy.  The examination was normal post right hemicolectomy.  Follow-up in 5 years recommended.  Patient re-presented with pelvic disease for which she was seen at [facility] and underwent surgery including temporary colostomy.  Her surgeon, Dr. Clovis Riley, is requesting flexible sigmoidoscopy to make sure there is no "leak".  The patient did undergo barium study of her proximal colon.  She also underwent barium study of the rectosigmoid stump.  Apparently she was not able to hold barium well enough to rule out leak.  She is anxious to have her colostomy reversed.  She is accompanied by her mother.  They tell me that she needs to have both colonoscopy and sigmoidoscopy performed.  Her history otherwise as outlined as follows from Dr. Georgina Snell most recent note:  ". 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 Benay Spice in [place]. The patient initially went to be evaluated in mid 2015 for anemia and early satiety. She had an EGD and a colonoscopy and was found to have colon cancer. She had a right hemicolectomy on 08/11/2013. Pathology showed T4N2a. She then underwent 12 cycles of FOLFOX. Approximately a year later she was found to have recurrence in her right ovary. She had a right salpingoopherectomy in 12/2014 and pathology showed metastatic colon cancer (she previously had hysterectomy and left salpingoopherectomy in 2006). She did not have any additional chemotherapy. In 2019, she was found to have a cystic mass on her right pelvic side wall. She had an FNA at that time which showed benign findings. Repeat CT scan in 04/2018 showed increased size of cystic mass with mural nodularity. She was also noted to have an elevation in her CEA. On 06/09/18 it was 8.34; previously it was 3.29 in 04/2018,  and 2.74 in 02/2018.  She had genetic testing which was negative. She was referred here for evaluation for possible CRS/HIPEC given imaging findings and elevation of CEA level.   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  The tumor returned microsatellite stable with no loss of mismatch repair protein expression   APC mutated. No BRAF, KRAS, or NRAS mutation On [facility] 1 testing   Cycle 1 adjuvant FOLFOX 09/08/2013   Cycle 2 adjuvant FOLFOX 09/24/2013   Cycle 3 adjuvant FOLFOX 10/08/2013.   Cycle 4 adjuvant FOLFOX 10/22/2013.   Cycle 5 adjuvant FOLFOX 11/05/2013. Oxaliplatin held due to thrombocytopenia.  Cycle 6 FOLFOX 11/19/2013.  Cycle 7 FOLFOX 12/03/2013. Oxaliplatin held secondary to thrombocytopenia.  Cycle 8 FOLFOX 12/17/2013.  Cycle 9 FOLFOX 01/04/2014. Oxaliplatin held secondary to neutropenia.   Cycle 10 FOLFOX 01/21/2014. Oxaliplatin held secondary to thrombocytopenia.  Cycle 11 FOLFOX 02/04/2014  Cycle 12 FOLFOX 02/18/2014, oxaliplatin dose reduced secondary tothrombocytopenia  CT abdomen/pelvis 01/30/2014 revealed splenomegaly and no evidence of recurrent colon cancer  CT chest 04/07/2014 with a stable right lower lobe nodule and no evidence for metastatic disease, no nodules seen on the CT 11/26/2014  Markedly elevated CEA 11/24/2014  CT 11/26/2014 revealed a right pelvic mass, splenomegaly, small volume ascites  Right salpingo-oophorectomy 12/28/2014 with the pathology confirming metastatic colon cancer  CTs 03/23/2016-no evidence of recurrent or metastatic disease.  CT 11/26/2016-enlargement of a fluid density structure the right pelvic sidewall, no other evidence of metastatic  disease  CT aspiration right pelvic cyst 12/19/2016.Cytology-BENIGN REACTIVE/REPARATIVE CHANGES.  CTs 06/05/2017-no evidence of metastatic disease,  mild cirrhotic changes with splenomegaly  CTs 12/11/2017- recurrent cystic right adnexal mass, similar to on the CT 11/26/2016, stable mild splenomegaly  CTs 04/23/2018- enlargement of cystic right adnexal mass with mural nodularity, no other evidence of metastatic disease 2. Mild elevation of the CEA beginning January 2016, normal on 05/19/2014   CRS-HIPEC (08/12/2018): Cytoreduction included omentectomy, LAR, R salpingo-oophorectomy, and L colonic gutter/pelvic stripping. R1 resection achieved  (09/03/18): She returns to clinic today for her scheduled post-op visit following her CRS-HIPEC on 08/12/2018. She states she is doing well since surgery. She endorses tolerating a regular diet with appropriate ostomy function. She is only having mild abdominal pain that is controlled with OTC Tylenol. She thinks she is almost back to her pre-op baseline. Her biggest concern is when her colostomy can be reversed. She had no other complaints or concerns.  10/08/2018: Clair Gulling returns for evaluation. She was last seen 3 weeks post operative. She is interested in having her colostomy reversed. She denied N/V/C/D, fever or other acute concerns."  Patient does have some occasional acid reflux symptoms for which she takes Mylanta.  As well occasional bloating.  She is having some mucus and occasional minor blood per rectum.  REVIEW OF SYSTEMS:  All non-GI ROS negative unless otherwise stated in the HPI except for insomnia  Past Medical History:  Diagnosis Date  . ADD (attention deficit disorder)   . Anemia, iron deficiency   . Anxiety   . Colon cancer ([place]) 08/2013  . Depression   . Difficulty swallowing pills   . GERD (gastroesophageal reflux disease)   . Headache    migraines "long time ago"  . History of blood transfusion 08/16/2013  . History of migraine    no problems in "a long time"  . 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 Restless leg syndrome   . Seizures ([place])     last seizure 08/2012    Past Surgical History:  Procedure Laterality Date  . ABDOMINAL HYSTERECTOMY  08/24/2004   partial  . APPENDECTOMY  08/24/2004  . COLON SURGERY  08/11/2013   removed a foot of colon  . COLONOSCOPY  2019  . colonoscopy 10-18-14    . COLONOSCOPY WITH PROPOFOL  07/07/2013  . CYSTOSCOPY  11/01/2003  . ENDOMETRIAL FULGURATION  11/01/2003  . ESOPHAGOGASTRODUODENOSCOPY  07/07/2013  . LAPAROSCOPIC LYSIS OF ADHESIONS  11/01/2003  . LAPAROSCOPIC PARTIAL COLECTOMY Right 08/11/2013   Procedure: LAPAROSCOPIC RIGHT HEMICOLECTOMY;  Surgeon: Stark Klein, MD;  Location: [facility];  Service: General;  Laterality: Right;  . LAPAROTOMY N/A 12/28/2014   Procedure: EXPLORATORY LAPAROTOMY;  Surgeon: Everitt Amber, MD;  Location: [facility];  Service: Gynecology;  Laterality: N/A;  . LEFT OOPHORECTOMY  08/24/2004  . PANNICULECTOMY  08/24/2004  . PORT-A-CATH REMOVAL Left 06/08/2014   Procedure: REMOVAL PORT-A-CATH;  Surgeon: Stark Klein, MD;  Location: [facility];  Service: General;  Laterality: Left;  . PORTACATH PLACEMENT Left 09/09/2013   Procedure: INSERTION PORT-A-CATH;  Surgeon: Stark Klein, MD;  Location: [facility];  Service: General;  Laterality: Left;  . SALPINGOOPHORECTOMY Right 12/28/2014   Procedure: RIGHT SALPINGO OOPHORECTOMY;  Surgeon: Everitt Amber, MD;  Location: [facility];  Service: Gynecology;  Laterality: Right;  . TUBAL LIGATION  11/07/2000  . UNILATERAL SALPINGECTOMY Left 08/24/2004  . WISDOM TOOTH EXTRACTION Bilateral 1994    Social History DYMPHNA WADLEY  reports  that she has never smoked. She has never used smokeless tobacco. She reports that she does not drink alcohol or use drugs.  family history includes Breast cancer in her maternal aunt; Cirrhosis in her maternal aunt; Colitis in her mother; Colon cancer in her maternal uncle; Colon polyps in her mother; Congestive Heart Failure in her father; Dementia in her maternal aunt, paternal aunt, and paternal uncle; Diabetes in her  father and mother; Heart attack in her father and mother; Heart disease in her father and mother; Lung cancer in her maternal uncle; Other in her cousin and paternal grandfather; Prostate cancer (age of onset: 40) in her paternal uncle; Stroke in her father.  Allergies  Allergen Reactions  . Adhesive [Tape] Hives, Shortness Of Breath and Swelling    Tolerates paper tape  . Eggs Or Egg-Derived Products Nausea And Vomiting  . Metoclopramide Hcl Other (See Comments)    EXACERBATES RESTLESS LEG SYNDROME  . Oxycodone-Acetaminophen Hives  . Sulfa Antibiotics Nausea And Vomiting  . Penicillins Itching    Has patient had a PCN reaction causing immediate rash, facial/tongue/throat swelling, SOB or lightheadedness with hypotension: No Has patient had a PCN reaction causing severe rash involving mucus membranes or skin necrosis: No Has patient had a PCN reaction that required hospitalization No Has patient had a PCN reaction occurring within the last 10 years: No If all of the above answers are "NO", then may proceed with Cephalosporin use.        PHYSICAL EXAMINATION: Vital signs: BP 122/70   Pulse 88   Ht _0  (1.676 m)   Wt 147 lb (66.7 kg)   BMI 23.73 kg/m   Constitutional: generally well-appearing, no acute distress Psychiatric: alert and oriented x3, cooperative Eyes: extraocular movements intact, anicteric, conjunctiva pink Mouth: oral pharynx moist, no lesions Neck: supple no lymphadenopathy Cardiovascular: heart regular rate and rhythm, no murmur Lungs: clear to auscultation bilaterally Abdomen: soft, nontender, nondistended, no obvious ascites, no peritoneal signs, normal bowel sounds, no organomegaly.  Functioning colostomy with bag in the left mid abdomen Rectal: Deferred until sigmoidoscopy Extremities: no clubbing, cyanosis, or lower extremity edema bilaterally Skin: no lesions on visible extremities Neuro: No focal deficits.  Cranial nerves intact  ASSESSMENT:  1.   Stage IV colon cancer.  Prior right hemicolectomy.  More recent pelvic surgery this summer with temporary colostomy.  Anticipating reversal.  Sigmoidoscopy requested to rule out leak.  Patient and mother requesting concurrent colonoscopy through the ostomy   PLAN:  1.  I advised him that colonoscopy was a poor way to assess for subtle leak.  However, we could rule out gross abnormalities.  I also advised that having had colonoscopy December 2019, evaluation of the proximal colon would yield very little additional information.  However, they push to have this done.The nature of the procedure (colonoscopy through ostomy as well as flexible sigmoidoscopy), as well as the risks, benefits, and alternatives were carefully and thoroughly reviewed with the patient. Ample time for discussion and questions allowed. The patient understood, was satisfied, and agreed to proceed. 2.  Surgical decisions per Dr. Clovis Riley.  Oncologic care per Dr. Benay Spice

## 2018-11-14 ENCOUNTER — Telehealth: Payer: Self-pay | Admitting: Internal Medicine

## 2018-11-14 NOTE — Telephone Encounter (Signed)
Covid-19 Screening Questions ° °  ° °  ° °Do you now or have you had a fever in the last 14 days?     NO ° °  ° °Do you have any respiratory symptoms of shortness of breath or cough now or in the last 14 days?    NO ° °  ° °Do you have any family members or close contacts with diagnosed or suspected Covid-19 in the past 14 days?   NO  ° °  ° °Have you been tested for Covid-19 and found to be positive?    NO ° °  ° °Pt made aware that care partner may wait in the car or come up to the lobby during the procedure but will need to provide their own mask. °

## 2018-11-17 ENCOUNTER — Other Ambulatory Visit: Payer: Self-pay

## 2018-11-17 ENCOUNTER — Ambulatory Visit: Payer: BC Managed Care – PPO | Admitting: Nurse Practitioner

## 2018-11-17 ENCOUNTER — Ambulatory Visit (AMBULATORY_SURGERY_CENTER): Payer: BC Managed Care – PPO | Admitting: Internal Medicine

## 2018-11-17 ENCOUNTER — Encounter: Payer: Self-pay | Admitting: Internal Medicine

## 2018-11-17 VITALS — BP 111/72 | HR 77 | Temp 98.6°F | Resp 20 | Ht 66.0 in | Wt 147.0 lb

## 2018-11-17 DIAGNOSIS — Z85038 Personal history of other malignant neoplasm of large intestine: Secondary | ICD-10-CM

## 2018-11-17 DIAGNOSIS — Z1211 Encounter for screening for malignant neoplasm of colon: Secondary | ICD-10-CM | POA: Diagnosis not present

## 2018-11-17 MED ORDER — SODIUM CHLORIDE 0.9 % IV SOLN: 500.0000 mL | Freq: Once | INTRAVENOUS | Status: DC

## 2018-11-17 NOTE — Progress Notes (Signed)
Report to PACU, RN, vss, BBS= Clear.  

## 2018-11-17 NOTE — Patient Instructions (Signed)
YOU HAD AN ENDOSCOPIC PROCEDURE TODAY AT THE [facility]:   Refer to the procedure report that was given to you for any specific questions about what was found during the examination.  If the procedure report does not answer your questions, please call your gastroenterologist to clarify.  If you requested that your care partner not be given the details of your procedure findings, then the procedure report has been included in a sealed envelope for you to review at your convenience later.  YOU SHOULD EXPECT: Some feelings of bloating in the abdomen. Passage of more gas than usual.  Walking can help get rid of the air that was put into your GI tract during the procedure and reduce the bloating. If you had a lower endoscopy (such as a colonoscopy or flexible sigmoidoscopy) you may notice spotting of blood in your stool or on the toilet paper. If you underwent a bowel prep for your procedure, you may not have a normal bowel movement for a few days.  Please Note:  You might notice some irritation and congestion in your nose or some drainage.  This is from the oxygen used during your procedure.  There is no need for concern and it should clear up in a day or so.  SYMPTOMS TO REPORT IMMEDIATELY:   Following lower endoscopy (colonoscopy or flexible sigmoidoscopy):  Excessive amounts of blood in the stool  Significant tenderness or worsening of abdominal pains  Swelling of the abdomen that is new, acute  Fever of 100F or higher  For urgent or emergent issues, a gastroenterologist can be reached at any hour by calling [phone].   DIET:  We do recommend a small meal at first, but then you may proceed to your regular diet.  Drink plenty of fluids but you should avoid alcoholic beverages for 24 hours.  ACTIVITY:  You should plan to take it easy for the rest of today and you should NOT DRIVE or use heavy machinery until tomorrow (because of the sedation medicines used during the test).     FOLLOW UP: Our staff will call the number listed on your records 48-72 hours following your procedure to check on you and address any questions or concerns that you may have regarding the information given to you following your procedure. If we do not reach you, we will leave a message.  We will attempt to reach you two times.  During this call, we will ask if you have developed any symptoms of COVID 19. If you develop any symptoms (ie: fever, flu-like symptoms, shortness of breath, cough etc.) before then, please call [phone].  If you test positive for Covid 19 in the 2 weeks post procedure, please call and report this information to us.    If any biopsies were taken you will be contacted by phone or by letter within the next 1-3 weeks.  Please call us at [phone] if you have not heard about the biopsies in 3 weeks.    SIGNATURES/CONFIDENTIALITY: You and/or your care partner have signed paperwork which will be entered into your electronic medical record.  These signatures attest to the fact that that the information above on your After Visit Summary has been reviewed and is understood.  Full responsibility of the confidentiality of this discharge information lies with you and/or your care-partner. 

## 2018-11-17 NOTE — Progress Notes (Signed)
JB- Temp - Vitals 

## 2018-11-17 NOTE — Op Note (Signed)
[facility] Patient Name: [name] Procedure Date: 11/17/2018 3:58 PM MRN: [id number] Endoscopist: Docia Chuck. Henrene Pastor , MD Age: 43 Referring MD:  Date of Birth: 01/19/1976 Gender: Female Account #: [id number] Procedure:                Colonoscopy Indications:              High risk colon cancer surveillance: Personal                            history of colon cancer Medicines:                Monitored Anesthesia Care Procedure:                Pre-Anesthesia Assessment:                           - Prior to the procedure, a History and Physical                            was performed, and patient medications and                            allergies were reviewed. The patient's tolerance of                            previous anesthesia was also reviewed. The risks                            and benefits of the procedure and the sedation                            options and risks were discussed with the patient.                            All questions were answered, and informed consent                            was obtained. Prior Anticoagulants: The patient has                            taken no previous anticoagulant or antiplatelet                            agents. ASA Grade Assessment: III - A patient with                            severe systemic disease. After reviewing the risks                            and benefits, the patient was deemed in                            satisfactory condition to undergo the procedure.  After obtaining informed consent, the colonoscope                            was passed under direct vision. Throughout the                            procedure, the patient's blood pressure, pulse, and                            oxygen saturations were monitored continuously. The                            Colonoscope was introduced through the anus and                            advanced to the limits of the pouch.  Subsequently                            the scope was passed through the stoma to evaluate                            the proximal anatomy. Anatomical landmarks were                            photographed. The quality of the bowel preparation                            was excellent. The colonoscopy was performed                            without difficulty. The patient tolerated the                            procedure well. The bowel preparation used was                            SUPREP via split dose instruction. Scope In: Scope Out: Findings:                 1. Flexible sigmoidoscopy per rectum. There were                            changes of mild diversion colitis. Scope was                            advanced for approximately 25 cm. The most proximal                            portion had necrotic appearing debris. The                            colostomy bag insufflated suggesting there was some  type of communication between this pouch and the                            proximal colon                           2. Introduction of the scope the the ostomy found                            that there were 2 available directions. One was                            toward the excluded distal pouch with similar                            necrotic appearing debris encountered. The other                            was about a 30 cm segment of normal appearing                            colonic mucosa to the level of the previous right                            hemicolectomy ileocolonic anastomosis.                           NOTE: Could not identify or rule out fistulous                            tract. I will discuss this further with Dr. Clovis Riley.                           Findings discussed with the patient and her                            husband. Copy of this procedure report provided to                            them.. Complications:            No  immediate complications. Estimated blood loss:                            None. Estimated Blood Loss:     Estimated blood loss: none. Impression:               - Erythematous mucosa in the recto-sigmoid colon.                           - No specimens collected. Recommendation:           - Repeat colonoscopy.                           - Patient has a contact number available for  emergencies. The signs and symptoms of potential                            delayed complications were discussed with the                            patient. Return to normal activities tomorrow.                            Written discharge instructions were provided to the                            patient.                           - Resume previous diet.                           - Continue present medications. Docia Chuck. Henrene Pastor, MD 11/17/2018 4:39:29 PM This report has been signed electronically.

## 2018-11-19 ENCOUNTER — Telehealth: Payer: Self-pay

## 2018-11-19 NOTE — Telephone Encounter (Signed)
  Follow up Call-  Call back number 11/17/2018 01/14/2018  Post procedure Call Back phone  # [phone]  Permission to leave phone message Yes Yes  Some recent data might be hidden     Patient questions:  Do you have a fever, pain , or abdominal swelling? No. Pain Score  0 *  Have you tolerated food without any problems? Yes.    Have you been able to return to your normal activities? Yes.    Do you have any questions about your discharge instructions: Diet   No. Medications  No. Follow up visit  No.  Do you have questions or concerns about your Care? No.  Actions: * If pain score is 4 or above: No action needed, pain <4.  1. Have you developed a fever since your procedure? no  2.   Have you had an respiratory symptoms (SOB or cough) since your procedure? no  3.   Have you tested positive for COVID 19 since your procedure no  4.   Have you had any family members/close contacts diagnosed with the COVID 19 since your procedure?  no   If yes to any of these questions please route to Joylene John, RN and Alphonsa Gin, Therapist, sports.

## 2018-11-21 ENCOUNTER — Encounter: Payer: Self-pay | Admitting: Nurse Practitioner

## 2018-11-21 ENCOUNTER — Inpatient Hospital Stay: Payer: BC Managed Care – PPO | Attending: Oncology | Admitting: Nurse Practitioner

## 2018-11-21 ENCOUNTER — Other Ambulatory Visit: Payer: Self-pay

## 2018-11-21 VITALS — BP 120/83 | HR 85 | Temp 98.7°F | Resp 17 | Ht 66.0 in | Wt 144.6 lb

## 2018-11-21 DIAGNOSIS — D509 Iron deficiency anemia, unspecified: Secondary | ICD-10-CM | POA: Insufficient documentation

## 2018-11-21 DIAGNOSIS — C7961 Secondary malignant neoplasm of right ovary: Secondary | ICD-10-CM | POA: Diagnosis not present

## 2018-11-21 DIAGNOSIS — C182 Malignant neoplasm of ascending colon: Secondary | ICD-10-CM | POA: Insufficient documentation

## 2018-11-21 DIAGNOSIS — D6959 Other secondary thrombocytopenia: Secondary | ICD-10-CM | POA: Diagnosis not present

## 2018-11-21 NOTE — Progress Notes (Addendum)
[place] OFFICE PROGRESS NOTE   Diagnosis: Colon cancer  INTERVAL HISTORY:   [name] returns as scheduled.  She underwent a colonoscopy 11/17/2018.  Stool consistency varies from loose to formed.  She reports feeling hungry but experiencing early satiety.  She is trying to eat multiple times a day.  She was unable to tolerate boost nutritional supplement due to diarrhea.  She continues to have "soreness" on the sides of the abdomen.  No nausea.  She did have an episode of vomiting after drinking contrast recently.  Objective:  Vital signs in last 24 hours:  Blood pressure 120/83, pulse 85, temperature 98.7 F (37.1 C), temperature source Temporal, resp. rate 17, height _0  (1.676 m), weight 144 lb 9.6 oz (65.6 kg), SpO2 100 %.    Lymphatics: No palpable cervical, supraclavicular or axillary lymph nodes. GI: Left lower quadrant colostomy.  No hepatomegaly.  Healed surgical incision. Vascular: No leg edema.   Lab Results:  Lab Results  Component Value Date   WBC 3.8 (L) 09/22/2018   HGB 9.1 (L) 09/22/2018   HCT 29.9 (L) 09/22/2018   MCV 84.9 09/22/2018   PLT 242 09/22/2018   NEUTROABS 2.9 09/22/2018    Imaging:  No results found.  Medications: I have reviewed the patient's current medications.  Assessment/Plan: 1. 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  The tumor returned microsatellite stable with no loss of mismatch repair protein expression   APC mutated. No BRAF, KRAS, or NRAS mutation On Foundation 1 testing   Cycle 1 adjuvant FOLFOX 09/08/2013   Cycle 2 adjuvant FOLFOX 09/24/2013   Cycle 3 adjuvant FOLFOX 10/08/2013.   Cycle 4 adjuvant FOLFOX 10/22/2013.   Cycle 5 adjuvant FOLFOX 11/05/2013. Oxaliplatin held due to thrombocytopenia.  Cycle 6 FOLFOX 11/19/2013.  Cycle 7 FOLFOX 12/03/2013. Oxaliplatin held secondary to  thrombocytopenia.  Cycle 8 FOLFOX 12/17/2013.  Cycle 9 FOLFOX 01/04/2014. Oxaliplatin held secondary to neutropenia.   Cycle 10 FOLFOX 01/21/2014. Oxaliplatin held secondary to thrombocytopenia.  Cycle 11 FOLFOX 02/04/2014  Cycle 12 FOLFOX 02/18/2014, oxaliplatin dose reduced secondary tothrombocytopenia  CT abdomen/pelvis 01/30/2014 revealed splenomegaly and no evidence of recurrent colon cancer  CT chest 04/07/2014 with a stable right lower lobe nodule and no evidence for metastatic disease, no nodules seen on the CT 11/26/2014  Markedly elevated CEA 11/24/2014  CT 11/26/2014 revealed a right pelvic mass, splenomegaly, small volume ascites  Right salpingo-oophorectomy 12/28/2014 with the pathology confirming metastatic colon cancer  CTs 03/23/2016-no evidence of recurrent or metastatic disease.  CT 11/26/2016-enlargement of a fluid density structure the right pelvic sidewall, no other evidence of metastatic disease  CT aspiration right pelvic cyst 12/19/2016.Cytology-BENIGN REACTIVE/REPARATIVE CHANGES.  CTs 06/05/2017-no evidence of metastatic disease, mild cirrhotic changes with splenomegaly  CTs 12/11/2017-recurrent cystic right adnexal mass, similar to on the CT 11/26/2016, stable mild splenomegaly  CTs 04/23/2018-enlargement of cystic right adnexal mass with mural nodularity, no other evidence of metastatic disease  Cytoreductive surgery/HIPEC with mitomycin by Dr. Clovis Riley at [facility] 08/12/2018-R1 resection achieved.  Cytoreduction included omentectomy, LAR, right salpingo-oophorectomy and left colonic gutter/pelvic stripping.  Pathology on the rectum showed recurrent/metastatic adenocarcinoma, tumor 2.0 cm, predominantly involving the subserosa and muscularis propria of the colon, proximal and distal margins of resection were negative, vascular invasion present, metastatic carcinoma present in 1 out of 5 lymph nodes; omentum resection with no malignancy seen, no metastatic  carcinoma identified in 1 lymph node examined; left  gutter stripping positive metastatic adenocarcinoma; right ovary resection positive metastatic adenocarcinoma. 2. Mild elevation of the CEA beginning January 2016, normal on 05/19/2014   3. History of iron deficiency anemia  4. seizure disorder  5. history of depression  6. 4 mm right lower lobe nodule on a staging chest CT 09/08/2013 , stable on a CT 04/07/2014 7. Hospitalization 09/24/2013 through 09/26/2013 with fever and abdominal pain.  8. 09/24/2013 urine culture positive for coag negative staph.  9. 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 Splenomegaly noted on a CT scan 01/30/2014,persistent on repeat CTs 12. Colonoscopy 11/17/2018- flexible sigmoidoscopy per rectum with changes of mild diversion colitis.  Scope advanced for approximately 25 cm.  Most proximal portion had necrotic appearing debris.  Colostomy bag insufflated suggesting some type of communication between the pouch and the proximal colon.  Introduction of scope into the ostomy found to available directions.  1 toward the distal pouch with similar appearing necrotic debris encountered.  The other was about a 30 cm segment of normal-appearing colonic mucosa to the level of the previous right hemicolectomy ileocolonic anastomosis.   Disposition: [name] appears stable.  We reviewed the recent colonoscopy report.  She is communicating with Dr. Clovis Riley regarding timing of colostomy reversal.  She has a good appetite but continues to lose weight.  We made a referral to the [facility] dietitian.  She will return for a follow-up visit in 2 to 3 weeks.  Patient seen with [name].   [name] ANP/GNP-BC   11/21/2018  12:42 PM This was a shared visit with [name].  [name] underwent a colonoscopy.  She is being scheduled for colostomy versus with Dr. Clovis Riley.  We  encouraged her to increase her diet as tolerated.  Emily Manson, MD

## 2018-12-05 ENCOUNTER — Inpatient Hospital Stay: Payer: BC Managed Care – PPO | Admitting: Nutrition

## 2018-12-05 ENCOUNTER — Other Ambulatory Visit: Payer: Self-pay

## 2018-12-05 DIAGNOSIS — C182 Malignant neoplasm of ascending colon: Secondary | ICD-10-CM | POA: Diagnosis not present

## 2018-12-05 NOTE — Progress Notes (Signed)
43 year old female diagnosed with colon cancer status post colostomy.  She is a patient of Dr. Benay Spice.  Patient is scheduled to speak with her surgeon for colostomy reversal.  Past medical history includes seizures, migraines, GERD, depression, colon cancer, anxiety, anemia, and ADD.  Medications include simethicone.  Labs were reviewed.  Height: 66 inches. Weight: 149.8 pounds. Usual body weight: 177 pounds in June 2020. BMI: 24.18.  Patient has lost 15% of her body weight over 3 months which is significant. Patient is anxious to have her colostomy reversed. She reports continual gas.  She has early satiety. She reports a good appetite and tries to eat 5-6 times daily. She is concerned with weight loss. Colostomy output generally varies between thin and watery to pasty.  Nutrition diagnosis:  Food and nutrition related knowledge deficit related to colon cancer and associated treatments as evidenced by no prior need for nutrition related information.  Intervention: I educated patient on strategies for reducing gas.  Encourage patient eat slow and chew food well.  Discouraged use of straws. Recommended high-protein foods and adequate calories for weight maintenance. Provided multiple samples of oral nutrition supplements and encourage patient to only consume 4 ounces at a time slowly over 30 minutes. Encouraged minimum of 64 ounces of water daily. Provided multiple fact sheets.  Contact information was given.  Questions were answered and teach back method used.  Monitoring, evaluation, goals: Patient will tolerate adequate calories and protein to promote healing after surgery and weight stability.  Patient will contact me if she develops questions or concerns.  **Disclaimer: This note was dictated with voice recognition software. Similar sounding words can inadvertently be transcribed and this note may contain transcription errors which may not have been corrected upon  publication of note.**

## 2018-12-12 ENCOUNTER — Inpatient Hospital Stay: Payer: BC Managed Care – PPO | Attending: Oncology | Admitting: Oncology

## 2018-12-12 ENCOUNTER — Other Ambulatory Visit: Payer: Self-pay

## 2018-12-12 ENCOUNTER — Encounter: Payer: Self-pay | Admitting: Oncology

## 2018-12-12 VITALS — BP 134/85 | HR 77 | Temp 98.3°F | Resp 18 | Ht 66.0 in | Wt 148.6 lb

## 2018-12-12 DIAGNOSIS — C7961 Secondary malignant neoplasm of right ovary: Secondary | ICD-10-CM | POA: Insufficient documentation

## 2018-12-12 DIAGNOSIS — C182 Malignant neoplasm of ascending colon: Secondary | ICD-10-CM | POA: Diagnosis not present

## 2018-12-12 NOTE — Progress Notes (Signed)
[facility] OFFICE PROGRESS NOTE   Diagnosis: Colon cancer  INTERVAL HISTORY:   [name] returns as scheduled.  She reports an improved oral intake.  Her meeting with the Cancer center dietitian helped.  She saw Dr. Levine for a telehealth visit on 12/05/2018.  She is scheduled for colostomy reversal on 12/22/2018.  Objective:  Vital signs in last 24 hours:  Blood pressure 134/85, pulse 77, temperature 98.3 F (36.8 C), temperature source Temporal, resp. rate 18, height 5' 6" (1.676 m), weight 148 lb 9.6 oz (67.4 kg), SpO2 100 %.    GI: No hepatomegaly, no mass, nontender, left lower quadrant colostomy with semiformed Betters stool Vascular: No leg edema   Lab Results:  Lab Results  Component Value Date   WBC 3.8 (L) 09/22/2018   HGB 9.1 (L) 09/22/2018   HCT 29.9 (L) 09/22/2018   MCV 84.9 09/22/2018   PLT 242 09/22/2018   NEUTROABS 2.9 09/22/2018    CMP  Lab Results  Component Value Date   NA 136 09/08/2018   K 3.7 09/08/2018   CL 99 09/08/2018   CO2 27 09/08/2018   GLUCOSE 177 (H) 09/08/2018   BUN 8 09/08/2018   CREATININE 0.71 09/08/2018   CALCIUM 9.3 09/08/2018   PROT 6.9 09/08/2018   ALBUMIN 3.0 (L) 09/08/2018   AST 12 (L) 09/08/2018   ALT 11 09/08/2018   ALKPHOS 65 09/08/2018   BILITOT 0.8 09/08/2018   GFRNONAA >60 09/08/2018   GFRAA >60 09/08/2018    Lab Results  Component Value Date   CEA1 3.11 10/10/2018     Medications: I have reviewed the patient's current medications.   Assessment/Plan: 1. 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  The tumor returned microsatellite stable with no loss of mismatch repair protein expression   APC mutated. No BRAF, KRAS, or NRAS mutation On Foundation 1 testing   Cycle 1 adjuvant FOLFOX 09/08/2013   Cycle 2 adjuvant FOLFOX 09/24/2013   Cycle 3 adjuvant FOLFOX 10/08/2013.   Cycle 4  adjuvant FOLFOX 10/22/2013.   Cycle 5 adjuvant FOLFOX 11/05/2013. Oxaliplatin held due to thrombocytopenia.  Cycle 6 FOLFOX 11/19/2013.  Cycle 7 FOLFOX 12/03/2013. Oxaliplatin held secondary to thrombocytopenia.  Cycle 8 FOLFOX 12/17/2013.  Cycle 9 FOLFOX 01/04/2014. Oxaliplatin held secondary to neutropenia.   Cycle 10 FOLFOX 01/21/2014. Oxaliplatin held secondary to thrombocytopenia.  Cycle 11 FOLFOX 02/04/2014  Cycle 12 FOLFOX 02/18/2014, oxaliplatin dose reduced secondary tothrombocytopenia  CT abdomen/pelvis 01/30/2014 revealed splenomegaly and no evidence of recurrent colon cancer  CT chest 04/07/2014 with a stable right lower lobe nodule and no evidence for metastatic disease, no nodules seen on the CT 11/26/2014  Markedly elevated CEA 11/24/2014  CT 11/26/2014 revealed a right pelvic mass, splenomegaly, small volume ascites  Right salpingo-oophorectomy 12/28/2014 with the pathology confirming metastatic colon cancer  CTs 03/23/2016-no evidence of recurrent or metastatic disease.  CT 11/26/2016-enlargement of a fluid density structure the right pelvic sidewall, no other evidence of metastatic disease  CT aspiration right pelvic cyst 12/19/2016.Cytology-BENIGN REACTIVE/REPARATIVE CHANGES.  CTs 06/05/2017-no evidence of metastatic disease, mild cirrhotic changes with splenomegaly  CTs 12/11/2017-recurrent cystic right adnexal mass, similar to on the CT 11/26/2016, stable mild splenomegaly  CTs 04/23/2018-enlargement of cystic right adnexal mass with mural nodularity, no other evidence of metastatic disease  Cytoreductive surgery/HIPEC with mitomycin by Dr. Levine at [facility] 08/12/2018-R1 resection achieved.  Cytoreduction included omentectomy, LAR, right salpingo-oophorectomy and left colonic   gutter/pelvic stripping.  Pathology on the rectum showed recurrent/metastatic adenocarcinoma, tumor 2.0 cm, predominantly involving the subserosa and muscularis propria of the  colon, proximal and distal margins of resection were negative, vascular invasion present, metastatic carcinoma present in 1 out of 5 lymph nodes; omentum resection with no malignancy seen, no metastatic carcinoma identified in 1 lymph node examined; left gutter stripping positive metastatic adenocarcinoma; right ovary resection positive metastatic adenocarcinoma. 2. Mild elevation of the CEA beginning January 2016, normal on 05/19/2014   3. History of iron deficiency anemia  4. seizure disorder  5. history of depression  6. 4 mm right lower lobe nodule on a staging chest CT 09/08/2013 , stable on a CT 04/07/2014 7. Hospitalization 09/24/2013 through 09/26/2013 with fever and abdominal pain.  8. 09/24/2013 urine culture positive for coag negative staph.  9. 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 Splenomegaly noted on a CT scan 01/30/2014,persistent on repeat CTs 12. Colonoscopy 11/17/2018- flexible sigmoidoscopy per rectum with changes of mild diversion colitis.  Scope advanced for approximately 25 cm.  Most proximal portion had necrotic appearing debris.  Colostomy bag insufflated suggesting some type of communication between the pouch and the proximal colon.  Introduction of scope into the ostomy found to available directions.  1 toward the distal pouch with similar appearing necrotic debris encountered.  The other was about a 30 cm segment of normal-appearing colonic mucosa to the level of the previous right hemicolectomy ileocolonic anastomosis.    Disposition: [name] has an improved performance status compared to when we saw her last month.  She has gained weight.  She is scheduled for colostomy reversal 12/22/2018.  She will return for office visit approximately 3 weeks after surgery.  We discussed the prognosis and indication for additional chemotherapy.  I explained there is no clear data  to support the use of chemotherapy in this setting, but we can consider this given the length of time since she last received chemotherapy.  We will check the CEA when she returns in 3 weeks.  Betsy Coder, MD  12/12/2018  9:00 AM

## 2018-12-15 ENCOUNTER — Encounter: Payer: Self-pay | Admitting: *Deleted

## 2018-12-15 ENCOUNTER — Other Ambulatory Visit: Payer: Self-pay | Admitting: *Deleted

## 2018-12-15 ENCOUNTER — Telehealth: Payer: Self-pay | Admitting: Oncology

## 2018-12-15 NOTE — Telephone Encounter (Signed)
Scheduled per los. Called and spoke with patient. Confirmed appt 

## 2018-12-17 DIAGNOSIS — C182 Malignant neoplasm of ascending colon: Secondary | ICD-10-CM | POA: Diagnosis not present

## 2018-12-17 DIAGNOSIS — Z20828 Contact with and (suspected) exposure to other viral communicable diseases: Secondary | ICD-10-CM | POA: Diagnosis not present

## 2018-12-17 DIAGNOSIS — Z01812 Encounter for preprocedural laboratory examination: Secondary | ICD-10-CM | POA: Diagnosis not present

## 2018-12-22 DIAGNOSIS — Z888 Allergy status to other drugs, medicaments and biological substances status: Secondary | ICD-10-CM | POA: Diagnosis not present

## 2018-12-22 DIAGNOSIS — Z823 Family history of stroke: Secondary | ICD-10-CM | POA: Diagnosis not present

## 2018-12-22 DIAGNOSIS — Z880 Allergy status to penicillin: Secondary | ICD-10-CM | POA: Diagnosis not present

## 2018-12-22 DIAGNOSIS — Z90710 Acquired absence of both cervix and uterus: Secondary | ICD-10-CM | POA: Diagnosis not present

## 2018-12-22 DIAGNOSIS — C182 Malignant neoplasm of ascending colon: Secondary | ICD-10-CM | POA: Diagnosis not present

## 2018-12-22 DIAGNOSIS — Z432 Encounter for attention to ileostomy: Secondary | ICD-10-CM | POA: Diagnosis not present

## 2018-12-22 DIAGNOSIS — Z885 Allergy status to narcotic agent status: Secondary | ICD-10-CM | POA: Diagnosis not present

## 2018-12-22 DIAGNOSIS — Z8249 Family history of ischemic heart disease and other diseases of the circulatory system: Secondary | ICD-10-CM | POA: Diagnosis not present

## 2018-12-22 DIAGNOSIS — Z85038 Personal history of other malignant neoplasm of large intestine: Secondary | ICD-10-CM | POA: Diagnosis not present

## 2018-12-22 DIAGNOSIS — Z833 Family history of diabetes mellitus: Secondary | ICD-10-CM | POA: Diagnosis not present

## 2018-12-22 DIAGNOSIS — Z8589 Personal history of malignant neoplasm of other organs and systems: Secondary | ICD-10-CM | POA: Diagnosis not present

## 2018-12-22 DIAGNOSIS — Z433 Encounter for attention to colostomy: Secondary | ICD-10-CM | POA: Diagnosis not present

## 2018-12-22 DIAGNOSIS — Z882 Allergy status to sulfonamides status: Secondary | ICD-10-CM | POA: Diagnosis not present

## 2018-12-29 ENCOUNTER — Emergency Department (HOSPITAL_COMMUNITY): Payer: BC Managed Care – PPO

## 2018-12-29 ENCOUNTER — Other Ambulatory Visit: Payer: Self-pay

## 2018-12-29 ENCOUNTER — Encounter (HOSPITAL_COMMUNITY): Payer: Self-pay

## 2018-12-29 ENCOUNTER — Inpatient Hospital Stay (HOSPITAL_COMMUNITY)
Admission: EM | Admit: 2018-12-29 | Discharge: 2019-01-01 | DRG: 394 | Disposition: A | Discharge status: Other Acute Inpatient Hospital | Payer: BC Managed Care – PPO | Attending: General Surgery | Admitting: General Surgery

## 2018-12-29 DIAGNOSIS — C799 Secondary malignant neoplasm of unspecified site: Secondary | ICD-10-CM

## 2018-12-29 DIAGNOSIS — G43909 Migraine, unspecified, not intractable, without status migrainosus: Secondary | ICD-10-CM | POA: Diagnosis not present

## 2018-12-29 DIAGNOSIS — Z800 Family history of malignant neoplasm of digestive organs: Secondary | ICD-10-CM

## 2018-12-29 DIAGNOSIS — Z885 Allergy status to narcotic agent status: Secondary | ICD-10-CM

## 2018-12-29 DIAGNOSIS — C7961 Secondary malignant neoplasm of right ovary: Secondary | ICD-10-CM | POA: Diagnosis not present

## 2018-12-29 DIAGNOSIS — R509 Fever, unspecified: Secondary | ICD-10-CM | POA: Diagnosis not present

## 2018-12-29 DIAGNOSIS — G2581 Restless legs syndrome: Secondary | ICD-10-CM | POA: Diagnosis present

## 2018-12-29 DIAGNOSIS — Z801 Family history of malignant neoplasm of trachea, bronchus and lung: Secondary | ICD-10-CM

## 2018-12-29 DIAGNOSIS — C786 Secondary malignant neoplasm of retroperitoneum and peritoneum: Secondary | ICD-10-CM | POA: Diagnosis present

## 2018-12-29 DIAGNOSIS — E440 Moderate protein-calorie malnutrition: Secondary | ICD-10-CM | POA: Diagnosis not present

## 2018-12-29 DIAGNOSIS — R109 Unspecified abdominal pain: Secondary | ICD-10-CM | POA: Diagnosis not present

## 2018-12-29 DIAGNOSIS — K567 Ileus, unspecified: Secondary | ICD-10-CM

## 2018-12-29 DIAGNOSIS — Y838 Other surgical procedures as the cause of abnormal reaction of the patient, or of later complication, without mention of misadventure at the time of the procedure: Secondary | ICD-10-CM | POA: Diagnosis not present

## 2018-12-29 DIAGNOSIS — Z91048 Other nonmedicinal substance allergy status: Secondary | ICD-10-CM

## 2018-12-29 DIAGNOSIS — Z8543 Personal history of malignant neoplasm of ovary: Secondary | ICD-10-CM

## 2018-12-29 DIAGNOSIS — Z880 Allergy status to penicillin: Secondary | ICD-10-CM

## 2018-12-29 DIAGNOSIS — E785 Hyperlipidemia, unspecified: Secondary | ICD-10-CM | POA: Diagnosis not present

## 2018-12-29 DIAGNOSIS — Z8249 Family history of ischemic heart disease and other diseases of the circulatory system: Secondary | ICD-10-CM | POA: Diagnosis not present

## 2018-12-29 DIAGNOSIS — K632 Fistula of intestine: Principal | ICD-10-CM | POA: Diagnosis present

## 2018-12-29 DIAGNOSIS — Z833 Family history of diabetes mellitus: Secondary | ICD-10-CM | POA: Diagnosis not present

## 2018-12-29 DIAGNOSIS — Z85038 Personal history of other malignant neoplasm of large intestine: Secondary | ICD-10-CM | POA: Diagnosis not present

## 2018-12-29 DIAGNOSIS — K219 Gastro-esophageal reflux disease without esophagitis: Secondary | ICD-10-CM | POA: Diagnosis present

## 2018-12-29 DIAGNOSIS — F988 Other specified behavioral and emotional disorders with onset usually occurring in childhood and adolescence: Secondary | ICD-10-CM | POA: Diagnosis present

## 2018-12-29 DIAGNOSIS — Z882 Allergy status to sulfonamides status: Secondary | ICD-10-CM

## 2018-12-29 DIAGNOSIS — R1110 Vomiting, unspecified: Secondary | ICD-10-CM | POA: Diagnosis not present

## 2018-12-29 DIAGNOSIS — Z888 Allergy status to other drugs, medicaments and biological substances status: Secondary | ICD-10-CM | POA: Diagnosis not present

## 2018-12-29 DIAGNOSIS — Z90710 Acquired absence of both cervix and uterus: Secondary | ICD-10-CM | POA: Diagnosis not present

## 2018-12-29 DIAGNOSIS — K7689 Other specified diseases of liver: Secondary | ICD-10-CM | POA: Diagnosis not present

## 2018-12-29 DIAGNOSIS — E876 Hypokalemia: Secondary | ICD-10-CM | POA: Diagnosis not present

## 2018-12-29 DIAGNOSIS — K9409 Other complications of colostomy: Secondary | ICD-10-CM | POA: Diagnosis not present

## 2018-12-29 DIAGNOSIS — C19 Malignant neoplasm of rectosigmoid junction: Secondary | ICD-10-CM | POA: Diagnosis not present

## 2018-12-29 DIAGNOSIS — K9189 Other postprocedural complications and disorders of digestive system: Secondary | ICD-10-CM | POA: Diagnosis not present

## 2018-12-29 DIAGNOSIS — K760 Fatty (change of) liver, not elsewhere classified: Secondary | ICD-10-CM | POA: Diagnosis not present

## 2018-12-29 DIAGNOSIS — Z9049 Acquired absence of other specified parts of digestive tract: Secondary | ICD-10-CM | POA: Diagnosis not present

## 2018-12-29 DIAGNOSIS — Z8589 Personal history of malignant neoplasm of other organs and systems: Secondary | ICD-10-CM | POA: Diagnosis not present

## 2018-12-29 DIAGNOSIS — Z803 Family history of malignant neoplasm of breast: Secondary | ICD-10-CM

## 2018-12-29 DIAGNOSIS — R1111 Vomiting without nausea: Secondary | ICD-10-CM | POA: Diagnosis not present

## 2018-12-29 DIAGNOSIS — Z20828 Contact with and (suspected) exposure to other viral communicable diseases: Secondary | ICD-10-CM | POA: Diagnosis present

## 2018-12-29 DIAGNOSIS — R112 Nausea with vomiting, unspecified: Secondary | ICD-10-CM | POA: Diagnosis not present

## 2018-12-29 DIAGNOSIS — Z823 Family history of stroke: Secondary | ICD-10-CM | POA: Diagnosis not present

## 2018-12-29 DIAGNOSIS — Z03818 Encounter for observation for suspected exposure to other biological agents ruled out: Secondary | ICD-10-CM | POA: Diagnosis not present

## 2018-12-29 DIAGNOSIS — Z809 Family history of malignant neoplasm, unspecified: Secondary | ICD-10-CM | POA: Diagnosis not present

## 2018-12-29 DIAGNOSIS — C182 Malignant neoplasm of ascending colon: Secondary | ICD-10-CM | POA: Diagnosis not present

## 2018-12-29 MED ORDER — SODIUM CHLORIDE 0.9 % IV SOLN
INTRAVENOUS | Status: DC
  Administered 2018-12-30 – 2018-12-31 (×4): via INTRAVENOUS

## 2018-12-29 MED ORDER — FENTANYL CITRATE (PF) 100 MCG/2ML IJ SOLN
100.0000 ug | INTRAMUSCULAR | Status: AC | PRN
  Administered 2018-12-30 (×4): 100 ug via INTRAVENOUS
  Filled 2018-12-29 (×4): qty 2

## 2018-12-29 MED ORDER — SODIUM CHLORIDE 0.9 % IV BOLUS
1000.0000 mL | Freq: Once | INTRAVENOUS | Status: AC
  Administered 2018-12-30: 1000 mL via INTRAVENOUS

## 2018-12-29 MED ORDER — ONDANSETRON HCL 4 MG/2ML IJ SOLN
4.0000 mg | Freq: Once | INTRAMUSCULAR | Status: AC
  Administered 2018-12-30: 4 mg via INTRAVENOUS
  Filled 2018-12-29: qty 2

## 2018-12-29 NOTE — ED Triage Notes (Signed)
Pt had colostomy reversal surgery 2 weeks ago. The last 24 hours her site has been leaking stool.  Denies and passing gas or BM except when she first got home.  Surgery was done at [facility]. 6/10 pain

## 2018-12-29 NOTE — ED Notes (Signed)
[facility] surgery paged to [phone]

## 2018-12-29 NOTE — ED Provider Notes (Signed)
[facility] EMERGENCY DEPARTMENT Provider Note   CSN: [id number] Arrival date & time: 12/29/18  2306     History   Chief Complaint Chief Complaint  Patient presents with  . Wound Check    HPI [name] is a 43 y.o. female.     HPI   She presents for evaluation of nausea and vomiting which started today and resulted in wound dehiscence of her abdominal wound.  She feels like there is stool draining from the wound.  Last bowel movement was 2 days ago.  She is taking her medications including MiraLAX, as prescribed.  She had a loop colostomy closure done, 12/22/2018 at [facility] health.  She has a history of colon cancer, with peritoneal carcinomatosis.  She denies fever, chills, cough, chest pain, weakness or dizziness.  There are no other known modifying factors.  Past Medical History:  Diagnosis Date  . ADD (attention deficit disorder)   . Anemia, iron deficiency   . Anxiety   . Colon cancer ([place]) 08/2013  . Depression   . Difficulty swallowing pills   . GERD (gastroesophageal reflux disease)   . Headache    migraines "long time ago"  . History of blood transfusion 08/16/2013  . History of migraine    no problems in "a long time"  . 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 Restless leg syndrome   . Seizures ([place])    last seizure 08/2012    Patient Active Problem List   Diagnosis Date Noted  . Genetic testing 11/13/2015  . Sinusitis 01/29/2015  . Pelvic mass in female 12/28/2014  . Ovarian mass, right   . Hypokalemia 02/03/2014  . Nausea 12/20/2013  . Fever 12/18/2013  . Dehydration 12/18/2013  . Diarrhea 10/28/2013  . Malfunction of device 10/28/2013  . Weight loss 10/28/2013  . Mucositis (ulcerative) due to antineoplastic therapy 10/28/2013  . Anemia due to antineoplastic chemotherapy 09/25/2013  . Chemotherapy induced thrombocytopenia 09/25/2013  . Hypomagnesemia 09/25/2013  . Hypophosphatemia 09/25/2013  . Adverse drug  effect 09/25/2013  . Abdominal pain 09/24/2013  . Cancer of ascending colon s/p right colectomy 08/11/2013 08/05/2013  . Anemia, iron deficiency 01/28/2013  . 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[place]) 11/11/2012  . Discoloration of skin of face 09/03/2012  . Medication reaction 05/03/2012  . Migraine 03/31/2012  . Hyperglycemia 01/18/2012  . Fatigue 12/12/2011  . ADD (attention deficit disorder) 09/23/2010  . Right wrist pain 05/01/2010  . LIBIDO, DECREASED 04/07/2010  . IRON DEFICIENCY 05/17/2009  . ANA POSITIVE 05/17/2009  . OVARIAN CYST 05/10/2009  . FATTY LIVER DISEASE 03/24/2009  . TRANSAMINASES, SERUM, ELEVATED 03/21/2009  . Gastroparesis 08/16/2008  . DEPRESSION/ANXIETY 09/19/2006  . HYPERLIPIDEMIA 08/16/2006  . RESTLESS LEG SYNDROME 08/16/2006  . GERD 08/16/2006  . IBS 08/16/2006    Past Surgical History:  Procedure Laterality Date  . ABDOMINAL HYSTERECTOMY  08/24/2004   partial  . APPENDECTOMY  08/24/2004  . COLON SURGERY  08/11/2013   removed a foot of colon  . COLONOSCOPY  2019  . colonoscopy 10-18-14    . COLONOSCOPY WITH PROPOFOL  07/07/2013  . CYSTOSCOPY  11/01/2003  . ENDOMETRIAL FULGURATION  11/01/2003  . ESOPHAGOGASTRODUODENOSCOPY  07/07/2013  . LAPAROSCOPIC LYSIS OF ADHESIONS  11/01/2003  . LAPAROSCOPIC PARTIAL COLECTOMY Right 08/11/2013   Procedure: LAPAROSCOPIC RIGHT HEMICOLECTOMY;  Surgeon: Stark Klein, MD;  Location: [place];  Service: General;  Laterality: Right;  . LAPAROTOMY N/A 12/28/2014   Procedure: EXPLORATORY LAPAROTOMY;  Surgeon: Terrence Dupont  Denman George, MD;  Location: [facility];  Service: Gynecology;  Laterality: N/A;  . LEFT OOPHORECTOMY  08/24/2004  . PANNICULECTOMY  08/24/2004  . PORT-A-CATH REMOVAL Left 06/08/2014   Procedure: REMOVAL PORT-A-CATH;  Surgeon: Stark Klein, MD;  Location: [place];  Service: General;  Laterality: Left;  . PORTACATH PLACEMENT Left 09/09/2013   Procedure: INSERTION PORT-A-CATH;  Surgeon: Stark Klein, MD;  Location: [place];   Service: General;  Laterality: Left;  . SALPINGOOPHORECTOMY Right 12/28/2014   Procedure: RIGHT SALPINGO OOPHORECTOMY;  Surgeon: Everitt Amber, MD;  Location: [facility];  Service: Gynecology;  Laterality: Right;  . TUBAL LIGATION  11/07/2000  . UNILATERAL SALPINGECTOMY Left 08/24/2004  . WISDOM TOOTH EXTRACTION Bilateral 1994     OB History   No obstetric history on file.      Home Medications    Prior to Admission medications   Medication Sig Start Date End Date Taking? Authorizing Provider  Ibuprofen-diphenhydrAMINE HCl 200-25 MG CAPS Take 1 tablet by mouth at bedtime. 09/29/18   [provider]  Simethicone (MYLANTA GAS PO) Take by mouth.    [provider]    Family History Family History  Problem Relation Age of Onset  . Colon polyps Mother        3+ colon polyps at each colonoscopy - "several"  . Colitis Mother   . Heart attack Mother   . Diabetes Mother   . Heart disease Mother   . Diabetes Father   . Heart attack Father   . Stroke Father   . Congestive Heart Failure Father   . Heart disease Father   . Cirrhosis Maternal Aunt   . Lung cancer Maternal Uncle        d. 60s; smoker and worked around asbestos  . Other Paternal Grandfather        "bone cancer"  . Colon cancer Maternal Uncle        dx early 48s  . Breast cancer Maternal Aunt        dx 50s-60s; s/p lumpectomy  . Dementia Maternal Aunt   . Other Cousin        maternal 1st cousin w/ "lung issues"  . Dementia Paternal Aunt   . Dementia Paternal Uncle   . Prostate cancer Paternal Uncle 37  . Esophageal cancer Neg Hx   . Rectal cancer Neg Hx   . Stomach cancer Neg Hx     Social History Social History   Tobacco Use  . Smoking status: Never Smoker  . Smokeless tobacco: Never Used  Substance Use Topics  . Alcohol use: No  . Drug use: No     Allergies   Adhesive [tape], Eggs or egg-derived products, Metoclopramide hcl, Oxycodone-acetaminophen, Sulfa antibiotics, and Penicillins    Review of Systems Review of Systems  All other systems reviewed and are negative.    Physical Exam Updated Vital Signs BP 121/79   Pulse 90   Temp 98.9 F (37.2 C) (Oral)   Resp 18   Ht 5\' 6"  (1.676 m)   Wt 63.5 kg   SpO2 100%   BMI 22.60 kg/m   Physical Exam Vitals signs and nursing note reviewed.  Constitutional:      General: She is in acute distress.     Appearance: Normal appearance. She is well-developed. She is ill-appearing. She is not toxic-appearing or diaphoretic.  HENT:     Head: Normocephalic and atraumatic.     Right Ear: External ear normal.  Left Ear: External ear normal.  Eyes:     Conjunctiva/sclera: Conjunctivae normal.     Pupils: Pupils are equal, round, and reactive to light.  Neck:     Musculoskeletal: Normal range of motion and neck supple.     Trachea: Phonation normal.  Cardiovascular:     Rate and Rhythm: Normal rate and regular rhythm.     Heart sounds: Normal heart sounds.  Pulmonary:     Effort: Pulmonary effort is normal. No respiratory distress.     Breath sounds: Normal breath sounds. No stridor.  Abdominal:     General: There is no distension.     Palpations: Abdomen is soft.     Tenderness: There is abdominal tenderness (Mild).     Comments: Surgical wound, area of umbilicus, which has previously been removed, with complete dehiscence.  There is thin Lanuza stool in the surgical wound.  This wound was immediately cleansed and a saline wet-to-dry dressing applied over it.  Musculoskeletal: Normal range of motion.  Skin:    General: Skin is warm and dry.  Neurological:     Mental Status: She is alert and oriented to person, place, and time.     Cranial Nerves: No cranial nerve deficit.     Sensory: No sensory deficit.     Motor: No abnormal muscle tone.     Coordination: Coordination normal.  Psychiatric:        Mood and Affect: Mood normal.        Behavior: Behavior normal.        Thought Content: Thought content normal.         Judgment: Judgment normal.      ED Treatments / Results  Labs (all labs ordered are listed, but only abnormal results are displayed) Labs Reviewed  COMPREHENSIVE METABOLIC PANEL - Abnormal; Notable for the following components:      Result Value   Sodium 133 (*)    Chloride 94 (*)    Glucose, Bld 181 (*)    BUN 22 (*)    Total Protein 8.3 (*)    AST 14 (*)    Total Bilirubin 1.3 (*)    All other components within normal limits  CBC WITH DIFFERENTIAL/PLATELET - Abnormal; Notable for the following components:   Hemoglobin 11.1 (*)    HCT 34.9 (*)    MCV 77.2 (*)    MCH 24.6 (*)    Lymphs Abs 0.4 (*)    All other components within normal limits  SARS CORONAVIRUS 2 (TAT 6-24 HRS)    EKG None  Radiology Ct Abdomen Pelvis W Contrast  Result Date: 12/30/2018 CLINICAL DATA:  History of recent colostomy reversal EXAM: CT ABDOMEN AND PELVIS WITH CONTRAST TECHNIQUE: Multidetector CT imaging of the abdomen and pelvis was performed using the standard protocol following bolus administration of intravenous contrast. CONTRAST:  152mL OMNIPAQUE IOHEXOL 300 MG/ML  SOLN COMPARISON:  08/07/2018 FINDINGS: Lower chest: No acute abnormality. Hepatobiliary: Liver demonstrates a normal enhancement pattern with the exception of multiple rounded hypodensities the largest of which lies in the medial segment of the left lobe of the liver measuring 2.6 cm consistent with metastatic disease. The gallbladder is within normal limits. Pancreas: Pancreas is unremarkable. Spleen: Spleen is mildly prominent without focal mass. Adrenals/Urinary Tract: Adrenal glands are within normal limits. Kidneys are well visualized bilaterally. Normal enhancement is seen. Fullness of the collecting systems is seen bilaterally. Slight delay in enhancement on the left is noted compared to the right right. No  definitive stone is seen. The obstructive changes are likely related to underlying scarring. Stomach/Bowel: Stomach is  decompressed. Some perigastric fluid is noted which was not seen on the prior exam. These fluid collections may be related to postoperative change and seromas. Multiple dilated loops of small bowel are noted within the mid abdomen. No definitive transition zone is seen. The anastomosis in the right lower quadrant is stable and widely patent. These changes may be related to the postsurgical changes related to the recent loop colostomy closure. There is inflammatory change in the surgical bed as well as fluid and air extrinsic to the bowel loops just below the wound. These changes are consistent with perforation and day and enterocutaneous fistula. Vascular/Lymphatic: No significant vascular findings are present. No enlarged abdominal or pelvic lymph nodes. Reproductive: Status post hysterectomy. No adnexal masses. Other: Free fluid is noted within the pelvis along the anterior aspect of the sacrum. These changes are new from the prior exam. This is likely related to the perforation of the colon anteriorly. Additionally an undeveloped somewhat ovoid collection of air and fluid is noted in the left pericolic gutter measuring 5.4 x 2.4 cm also consistent with a postoperative fluid collection. This may represent a developing abscess. Musculoskeletal: Degenerative changes of lumbar spine are seen. No acute bony abnormality is noted. IMPRESSION: Postsurgical changes in the anterior abdomen consistent with the recent loop colostomy closure. Anterior to the colonic postsurgical changes, there is a focal air and fluid collection which measures 3.9 x 1.9 cm in greatest transverse and AP dimension and 7 cm in craniocaudad dimension consistent with focal perforation and stool leakage. This extends through the anterior abdominal wall consistent with the given clinical history. This is consistent with an enterocutaneous fistula. Fluid collections are noted in the left pericolic gutter as well as adjacent to the stomach both  anteriorly and posteriorly likely related to postoperative fluid collections. This may represent developing abscesses. Multiple dilated loops of small bowel, likely a reactive ileus as no definitive transition zone is seen. Dilatation of the collecting systems of the kidneys bilaterally left greater than right likely related to distal pressure on the ureters as no definitive stones are seen. Changes consistent with multiple hepatic metastatic lesions. These were not present on the most recent available exam. Electronically Signed   By: Inez Catalina M.D.   On: 12/30/2018 01:29   Dg Chest Port 1 View  Result Date: 12/29/2018 CLINICAL DATA:  Vomiting. EXAM: PORTABLE CHEST 1 VIEW COMPARISON:  10/23/2010 FINDINGS: The heart size and mediastinal contours are within normal limits. Both lungs are clear. The visualized skeletal structures are unremarkable. IMPRESSION: No active disease. Electronically Signed   By: Constance Holster M.D.   On: 12/29/2018 23:54    Procedures .Critical Care Performed by: Daleen Bo, MD Authorized by: Daleen Bo, MD   Critical care provider statement:    Critical care time (minutes):  35   Critical care start time:  12/29/2018 11:05 AM   Critical care end time:  12/30/2018 3:32 AM   Critical care time was exclusive of:  Separately billable procedures and treating other patients   Critical care was necessary to treat or prevent imminent or life-threatening deterioration of the following conditions: Bowel perforation, enterocutaneous fistula.   Critical care was time spent personally by me on the following activities:  Blood draw for specimens, development of treatment plan with patient or surrogate, discussions with consultants, evaluation of patient's response to treatment, examination of patient, obtaining history from patient  or surrogate, ordering and performing treatments and interventions, ordering and review of laboratory studies, pulse oximetry, re-evaluation  of patient's condition, review of old charts and ordering and review of radiographic studies   (including critical care time)  Medications Ordered in ED Medications  0.9 %  sodium chloride infusion ( Intravenous New Bag/Given 12/30/18 0012)  fentaNYL (SUBLIMAZE) injection 100 mcg (100 mcg Intravenous Given 12/30/18 0251)  vancomycin (VANCOCIN) IVPB 1000 mg/200 mL premix (has no administration in time range)  piperacillin-tazobactam (ZOSYN) IVPB 3.375 g (has no administration in time range)  sodium chloride 0.9 % bolus 1,000 mL (0 mLs Intravenous Stopped 12/30/18 0102)  ondansetron (ZOFRAN) injection 4 mg (4 mg Intravenous Given 12/30/18 0009)  vancomycin (VANCOCIN) 1,500 mg in sodium chloride 0.9 % 500 mL IVPB (0 mg Intravenous Stopped 12/30/18 0328)  piperacillin-tazobactam (ZOSYN) IVPB 3.375 g (0 g Intravenous Stopped 12/30/18 0049)  iohexol (OMNIPAQUE) 300 MG/ML solution 100 mL (100 mLs Intravenous Contrast Given 12/30/18 0102)     Initial Impression / Assessment and Plan / ED Course  I have reviewed the triage vital signs and the nursing notes.  Pertinent labs & imaging results that were available during my care of the patient were reviewed by me and considered in my medical decision making (see chart for details).  Clinical Course as of Dec 29 332  Mon Dec 29, 2018  2346 I discussed the case with Dr. Fransisca Connors, physician on-call at [facility] health at this time.  She is covering for Dr. Clovis Riley.  She desires that the patient be transferred, but it is not clear at this point if there is a bed available.  We will initiate further treatment and care at this time pending transfer when available bed.   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 After initial assessment and treatment, an updated evaluation reveals she states she is more comfortable this time.  I reviewed findings with her and discussed the treatment plan.  With continuation to await for an open bed at [place] for transfer. Daleen Bo   Medical Decision Making: Postoperative complication, enterocutaneous fistula, with stool leakage into abdominal cavity.  Patient treated with antibiotics in preparation for likely surgical repair.  She will be transferred to her facility where she received the surgery, 1 week ago.  She is currently hemodynamically stable.  Prophylactic antibiotics begun.  LAURIAN MANCHEGO was evaluated in Emergency Department on 12/30/2018 for the symptoms described  in the history of present illness. She was evaluated in the context of the global COVID-19 pandemic, which necessitated consideration that the patient might be at risk for infection with the SARS-CoV-2 virus that causes COVID-19. Institutional protocols and algorithms that pertain to the evaluation of patients at risk for COVID-19 are in a state of rapid change based on information released by regulatory bodies including the CDC and federal and state organizations. These policies and algorithms were followed during the patient's care in the ED.   CRITICAL CARE- yes Performed by: Daleen Bo  Nursing Notes Reviewed/ Care Coordinated Applicable Imaging Reviewed Interpretation of Laboratory Data incorporated into ED treatment   Plan-transfer to [facility].  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

## 2018-12-30 ENCOUNTER — Emergency Department (HOSPITAL_COMMUNITY): Payer: BC Managed Care – PPO

## 2018-12-30 LAB — CBC WITH DIFFERENTIAL/PLATELET
Abs Immature Granulocytes: 0.01 10*3/uL (ref 0.00–0.07)
Abs Immature Granulocytes: 0.03 10*3/uL (ref 0.00–0.07)
Basophils Absolute: 0 10*3/uL (ref 0.0–0.1)
Basophils Absolute: 0 10*3/uL (ref 0.0–0.1)
Basophils Relative: 0 %
Basophils Relative: 0 %
Eosinophils Absolute: 0 10*3/uL (ref 0.0–0.5)
Eosinophils Absolute: 0 10*3/uL (ref 0.0–0.5)
Eosinophils Relative: 0 %
Eosinophils Relative: 1 %
HCT: 30.8 % — ABNORMAL LOW (ref 36.0–46.0)
HCT: 34.9 % — ABNORMAL LOW (ref 36.0–46.0)
Hemoglobin: 11.1 g/dL — ABNORMAL LOW (ref 12.0–15.0)
Hemoglobin: 9.4 g/dL — ABNORMAL LOW (ref 12.0–15.0)
Immature Granulocytes: 0 %
Immature Granulocytes: 1 %
Lymphocytes Relative: 20 %
Lymphocytes Relative: 7 %
Lymphs Abs: 0.4 10*3/uL — ABNORMAL LOW (ref 0.7–4.0)
Lymphs Abs: 0.6 10*3/uL — ABNORMAL LOW (ref 0.7–4.0)
MCH: 24.2 pg — ABNORMAL LOW (ref 26.0–34.0)
MCH: 24.6 pg — ABNORMAL LOW (ref 26.0–34.0)
MCHC: 30.5 g/dL (ref 30.0–36.0)
MCHC: 31.8 g/dL (ref 30.0–36.0)
MCV: 77.2 fL — ABNORMAL LOW (ref 80.0–100.0)
MCV: 79.4 fL — ABNORMAL LOW (ref 80.0–100.0)
Monocytes Absolute: 0.3 10*3/uL (ref 0.1–1.0)
Monocytes Absolute: 0.4 10*3/uL (ref 0.1–1.0)
Monocytes Relative: 8 %
Monocytes Relative: 9 %
Neutro Abs: 2.3 10*3/uL (ref 1.7–7.7)
Neutro Abs: 4.7 10*3/uL (ref 1.7–7.7)
Neutrophils Relative %: 70 %
Neutrophils Relative %: 84 %
Platelets: 169 10*3/uL (ref 150–400)
Platelets: 201 10*3/uL (ref 150–400)
RBC: 3.88 MIL/uL (ref 3.87–5.11)
RBC: 4.52 MIL/uL (ref 3.87–5.11)
RDW: 14.2 % (ref 11.5–15.5)
RDW: 14.6 % (ref 11.5–15.5)
WBC: 3.3 10*3/uL — ABNORMAL LOW (ref 4.0–10.5)
WBC: 5.5 10*3/uL (ref 4.0–10.5)
nRBC: 0 % (ref 0.0–0.2)
nRBC: 0 % (ref 0.0–0.2)

## 2018-12-30 LAB — COMPREHENSIVE METABOLIC PANEL
ALT: 10 U/L (ref 0–44)
ALT: 12 U/L (ref 0–44)
AST: 11 U/L — ABNORMAL LOW (ref 15–41)
AST: 14 U/L — ABNORMAL LOW (ref 15–41)
Albumin: 3.1 g/dL — ABNORMAL LOW (ref 3.5–5.0)
Albumin: 3.9 g/dL (ref 3.5–5.0)
Alkaline Phosphatase: 55 U/L (ref 38–126)
Alkaline Phosphatase: 71 U/L (ref 38–126)
Anion gap: 14 (ref 5–15)
Anion gap: 7 (ref 5–15)
BUN: 20 mg/dL (ref 6–20)
BUN: 22 mg/dL — ABNORMAL HIGH (ref 6–20)
CO2: 25 mmol/L (ref 22–32)
CO2: 26 mmol/L (ref 22–32)
Calcium: 8.5 mg/dL — ABNORMAL LOW (ref 8.9–10.3)
Calcium: 9.9 mg/dL (ref 8.9–10.3)
Chloride: 101 mmol/L (ref 98–111)
Chloride: 94 mmol/L — ABNORMAL LOW (ref 98–111)
Creatinine, Ser: 0.76 mg/dL (ref 0.44–1.00)
Creatinine, Ser: 0.96 mg/dL (ref 0.44–1.00)
GFR calc Af Amer: >60 mL/min (ref >60)
GFR calc Af Amer: >60 mL/min (ref >60)
GFR calc non Af Amer: >60 mL/min (ref >60)
GFR calc non Af Amer: >60 mL/min (ref >60)
Glucose, Bld: 181 mg/dL — ABNORMAL HIGH (ref 70–99)
Glucose, Bld: 99 mg/dL (ref 70–99)
Potassium: 3.6 mmol/L (ref 3.5–5.1)
Potassium: 3.9 mmol/L (ref 3.5–5.1)
Sodium: 133 mmol/L — ABNORMAL LOW (ref 135–145)
Sodium: 134 mmol/L — ABNORMAL LOW (ref 135–145)
Total Bilirubin: 0.9 mg/dL (ref 0.3–1.2)
Total Bilirubin: 1.3 mg/dL — ABNORMAL HIGH (ref 0.3–1.2)
Total Protein: 6.5 g/dL (ref 6.5–8.1)
Total Protein: 8.3 g/dL — ABNORMAL HIGH (ref 6.5–8.1)

## 2018-12-30 LAB — SARS CORONAVIRUS 2 (TAT 6-24 HRS): SARS Coronavirus 2: NEGATIVE

## 2018-12-30 MED ORDER — IOHEXOL 300 MG/ML  SOLN
100.0000 mL | Freq: Once | INTRAMUSCULAR | Status: AC | PRN
  Administered 2018-12-30: 01:00:00 100 mL via INTRAVENOUS

## 2018-12-30 MED ORDER — FENTANYL CITRATE (PF) 100 MCG/2ML IJ SOLN
100.0000 ug | Freq: Once | INTRAMUSCULAR | Status: AC
  Administered 2018-12-30: 08:00:00 100 ug via INTRAVENOUS

## 2018-12-30 MED ORDER — FENTANYL CITRATE (PF) 100 MCG/2ML IJ SOLN
100.0000 ug | Freq: Once | INTRAMUSCULAR | Status: AC
  Administered 2018-12-30: 100 ug via INTRAVENOUS
  Filled 2018-12-30: qty 2

## 2018-12-30 MED ORDER — MORPHINE SULFATE (PF) 4 MG/ML IV SOLN
4.0000 mg | Freq: Once | INTRAVENOUS | Status: AC
  Administered 2018-12-30: 4 mg via INTRAVENOUS
  Filled 2018-12-30: qty 1

## 2018-12-30 MED ORDER — VANCOMYCIN HCL IN DEXTROSE 1-5 GM/200ML-% IV SOLN: 1000.0000 mg | Freq: Two times a day (BID) | INTRAVENOUS | Status: DC

## 2018-12-30 MED ORDER — ONDANSETRON HCL 4 MG/2ML IJ SOLN
4.0000 mg | INTRAMUSCULAR | Status: AC | PRN
  Administered 2018-12-30 (×2): 4 mg via INTRAVENOUS
  Filled 2018-12-30 (×2): qty 2

## 2018-12-30 MED ORDER — FENTANYL CITRATE (PF) 100 MCG/2ML IJ SOLN
INTRAMUSCULAR | Status: AC
  Filled 2018-12-30: qty 2

## 2018-12-30 MED ORDER — VANCOMYCIN HCL 10 G IV SOLR
1500.0000 mg | Freq: Once | INTRAVENOUS | Status: AC
  Administered 2018-12-30: 1500 mg via INTRAVENOUS
  Filled 2018-12-30: qty 1500

## 2018-12-30 MED ORDER — PIPERACILLIN-TAZOBACTAM 3.375 G IVPB 30 MIN
3.3750 g | Freq: Once | INTRAVENOUS | Status: AC
  Administered 2018-12-30: 3.375 g via INTRAVENOUS
  Filled 2018-12-30: qty 50

## 2018-12-30 MED ORDER — PIPERACILLIN-TAZOBACTAM 3.375 G IVPB
3.3750 g | Freq: Three times a day (TID) | INTRAVENOUS | Status: DC
  Administered 2018-12-30 – 2019-01-01 (×8): 3.375 g via INTRAVENOUS
  Filled 2018-12-30 (×8): qty 50

## 2018-12-30 MED ORDER — VANCOMYCIN HCL IN DEXTROSE 750-5 MG/150ML-% IV SOLN
750.0000 mg | Freq: Two times a day (BID) | INTRAVENOUS | Status: DC
  Administered 2018-12-30 – 2018-12-31 (×2): 750 mg via INTRAVENOUS
  Filled 2018-12-30 (×3): qty 150

## 2018-12-30 NOTE — ED Notes (Signed)
Pt assisted on the bedpan and off

## 2018-12-30 NOTE — ED Notes (Signed)
Dsg leaking again, large amount of liquid Scarpino stool around dressing and in bed. Complete linen change and dressing change at this time as well as skin care. Pt requesting something for pain. MD notified.

## 2018-12-30 NOTE — ED Notes (Signed)
Large amount of stool had saturated abdominal dressing and was leaking. Pt was placed in hospital gown, linens changed and wound redressed with saline soaked gauze, nonadherent dressing, abd pad and cloth tape.

## 2018-12-30 NOTE — ED Notes (Signed)
Patient states her dressing is leaking. Removed dressing and cleaned area. Applied clean dressing including wet gauze, dry gauze, tefla, and abdominal padding. Patient tolerated well.

## 2018-12-30 NOTE — ED Notes (Signed)
RN spoke with Lurena Joiner at [facility] PALs line. Patient remains on wait list for Oncology/Surgery floor. No beds available at this time. States he is awaiting discharges and surgical admit placements, so unable to give a time frame of when he thinks patient will get a bed.

## 2018-12-30 NOTE — ED Provider Notes (Signed)
Discussed situation with better control at [facility].  Possible bed available approximately 1700 today.  Will update when appropriate.   Nat Christen, MD 12/30/18 1145

## 2018-12-30 NOTE — ED Notes (Signed)
Called [facility] to get update on Pt coming to them.  Spoke with Angelic on PALS line who will check into this and have Surgeon or accepting Doctor call Dr. Wyvonnia Dusky to try to expedite the Transfer.  CN, Eboni  informed.

## 2018-12-30 NOTE — ED Notes (Signed)
Called [facility] for vancomycin 

## 2018-12-30 NOTE — ED Notes (Signed)
Pt began dry heaving and dressing became soiled. Clean linens and gown provided. Dressing changed.

## 2018-12-30 NOTE — ED Provider Notes (Signed)
Patient has been holding in the ED for 19 hours awaiting bed at [facility].  She remains hemodynamically stable.  Discussed with [facility] transfer line who states there were no beds available and no timeframe for an available bed.  Discussed with surgeon on-call Dr. Ayesha Rumpf who states nothing can be done to expedite the process.  There is no way the patient can board in the ER as they are at capacity as well.  He agrees with IV antibiotics and IV fluids.  States Garment/textile technologist may evaluate patient if she decompensates.   Ezequiel Essex, MD 12/31/18 [phone]

## 2018-12-30 NOTE — Progress Notes (Signed)
Pharmacy Antibiotic Note  [name] is a 43 y.o. female admitted on 12/29/2018 with wound infection.  Pharmacy has been consulted for vancomycin dosing.  Patient has wound dehiscence of abdominal wound s/p loop colostomy closure on 12-21-08 at [facility].    Plan: Loading dose:  vancomycin 1.5g  IV x1 dose Maintenance dose:  vancomycin  1g IV  q12 h Goal vancomycin trough range:   10-15  mcg/mL Pharmacy will continue to monitor renal function, vancomycin troughs as clinically appropriate,  cultures and patient progress.    Height: 5\' 6"  (167.6 cm) Weight: 140 lb (63.5 kg) IBW/kg (Calculated) : 59.3  Temp (24hrs), Avg:98.9 F (37.2 C), Min:98.9 F (37.2 C), Max:98.9 F (37.2 C)  Recent Labs  Lab 12/29/18 2356  WBC 5.5  CREATININE 0.96    Estimated Creatinine Clearance: 69.3 mL/min (by C-G formula based on SCr of 0.96 mg/dL).    Allergies  Allergen Reactions  . Adhesive [Tape] Hives, Shortness Of Breath and Swelling    Tolerates paper tape  . Eggs Or Egg-Derived Products Nausea And Vomiting  . Metoclopramide Hcl Other (See Comments)    EXACERBATES RESTLESS LEG SYNDROME  . Oxycodone-Acetaminophen Hives  . Sulfa Antibiotics Nausea And Vomiting  . Penicillins Itching    Has patient had a PCN reaction causing immediate rash, facial/tongue/throat swelling, SOB or lightheadedness with hypotension: No Has patient had a PCN reaction causing severe rash involving mucus membranes or skin necrosis: No Has patient had a PCN reaction that required hospitalization No Has patient had a PCN reaction occurring within the last 10 years: No If all of the above answers are "NO", then may proceed with Cephalosporin use.     Antimicrobials this admission: Zosyn  11/24>>   vancomycin 11/24>>     Microbiology results:  11/23 SARS CoV-2 pending    Thank you for allowing pharmacy to be a part of this patient's care.  Despina Pole 12/30/2018 2:53 AM

## 2018-12-30 NOTE — ED Notes (Signed)
Spoke with Ulice Dash on the [facility] pals line. Patient is still on wait list at this time.

## 2018-12-30 NOTE — Progress Notes (Signed)
Pharmacy Antibiotic Note  [name] is a 43 y.o. female admitted on 12/29/2018 with wound infection.  Pharmacy has been consulted for vancomycin dosing.  Patient has wound dehiscence of abdominal wound s/p loop colostomy closure on 12-21-08 at [facility].    Plan: Change vancomycin  750 mg IV  q12 h Goal vancomycin trough range:   10-15  Mcg/mL F/U cxs and clinical progress monitor V/S, labs, and levels as indicated  Height: 5\' 6"  (167.6 cm) Weight: 140 lb (63.5 kg) IBW/kg (Calculated) : 59.3  Temp (24hrs), Avg:98.9 F (37.2 C), Min:98.9 F (37.2 C), Max:98.9 F (37.2 C)  Recent Labs  Lab 12/29/18 2356  WBC 5.5  CREATININE 0.96    Estimated Creatinine Clearance: 69.3 mL/min (by C-G formula based on SCr of 0.96 mg/dL).    Allergies  Allergen Reactions  . Adhesive [Tape] Hives, Shortness Of Breath and Swelling    Tolerates paper tape  . Eggs Or Egg-Derived Products Nausea And Vomiting  . Metoclopramide Hcl Other (See Comments)    EXACERBATES RESTLESS LEG SYNDROME  . Oxycodone-Acetaminophen Hives  . Sulfa Antibiotics Nausea And Vomiting  . Penicillins Itching    Has patient had a PCN reaction causing immediate rash, facial/tongue/throat swelling, SOB or lightheadedness with hypotension: No Has patient had a PCN reaction causing severe rash involving mucus membranes or skin necrosis: No Has patient had a PCN reaction that required hospitalization No Has patient had a PCN reaction occurring within the last 10 years: No If all of the above answers are "NO", then may proceed with Cephalosporin use.     Antimicrobials this admission: Zosyn  11/24>>   vancomycin 11/24>>     Microbiology results:  11/23 SARS CoV-2 pending   Thank you for allowing pharmacy to be a part of this patient's care.  Isac Sarna, BS Pharm D, [place] Clinical Pharmacist Pager [phone] 12/30/2018 8:54 AM

## 2018-12-30 NOTE — ED Notes (Addendum)
[facility] contacted for bed inquiry. They stated that there were no surgical beds available at this time and they would give [place] a call around 0500 for an update. They also stated that transfer would most likely be around noon today (after discharges).

## 2018-12-31 ENCOUNTER — Emergency Department: Payer: Self-pay

## 2018-12-31 DIAGNOSIS — Z885 Allergy status to narcotic agent status: Secondary | ICD-10-CM | POA: Diagnosis not present

## 2018-12-31 DIAGNOSIS — C786 Secondary malignant neoplasm of retroperitoneum and peritoneum: Secondary | ICD-10-CM | POA: Diagnosis present

## 2018-12-31 DIAGNOSIS — C799 Secondary malignant neoplasm of unspecified site: Secondary | ICD-10-CM

## 2018-12-31 DIAGNOSIS — Z823 Family history of stroke: Secondary | ICD-10-CM | POA: Diagnosis not present

## 2018-12-31 DIAGNOSIS — K567 Ileus, unspecified: Secondary | ICD-10-CM

## 2018-12-31 DIAGNOSIS — Z800 Family history of malignant neoplasm of digestive organs: Secondary | ICD-10-CM | POA: Diagnosis not present

## 2018-12-31 DIAGNOSIS — Z801 Family history of malignant neoplasm of trachea, bronchus and lung: Secondary | ICD-10-CM | POA: Diagnosis not present

## 2018-12-31 DIAGNOSIS — Z880 Allergy status to penicillin: Secondary | ICD-10-CM | POA: Diagnosis not present

## 2018-12-31 DIAGNOSIS — Z91048 Other nonmedicinal substance allergy status: Secondary | ICD-10-CM | POA: Diagnosis not present

## 2018-12-31 DIAGNOSIS — F988 Other specified behavioral and emotional disorders with onset usually occurring in childhood and adolescence: Secondary | ICD-10-CM | POA: Diagnosis present

## 2018-12-31 DIAGNOSIS — G2581 Restless legs syndrome: Secondary | ICD-10-CM | POA: Diagnosis present

## 2018-12-31 DIAGNOSIS — K632 Fistula of intestine: Principal | ICD-10-CM

## 2018-12-31 DIAGNOSIS — Z882 Allergy status to sulfonamides status: Secondary | ICD-10-CM | POA: Diagnosis not present

## 2018-12-31 DIAGNOSIS — G43909 Migraine, unspecified, not intractable, without status migrainosus: Secondary | ICD-10-CM | POA: Diagnosis present

## 2018-12-31 DIAGNOSIS — K760 Fatty (change of) liver, not elsewhere classified: Secondary | ICD-10-CM | POA: Diagnosis present

## 2018-12-31 DIAGNOSIS — Z8249 Family history of ischemic heart disease and other diseases of the circulatory system: Secondary | ICD-10-CM | POA: Diagnosis not present

## 2018-12-31 DIAGNOSIS — Z803 Family history of malignant neoplasm of breast: Secondary | ICD-10-CM | POA: Diagnosis not present

## 2018-12-31 DIAGNOSIS — C19 Malignant neoplasm of rectosigmoid junction: Secondary | ICD-10-CM | POA: Diagnosis present

## 2018-12-31 DIAGNOSIS — Z833 Family history of diabetes mellitus: Secondary | ICD-10-CM | POA: Diagnosis not present

## 2018-12-31 DIAGNOSIS — Z8543 Personal history of malignant neoplasm of ovary: Secondary | ICD-10-CM | POA: Diagnosis not present

## 2018-12-31 DIAGNOSIS — E785 Hyperlipidemia, unspecified: Secondary | ICD-10-CM | POA: Diagnosis present

## 2018-12-31 DIAGNOSIS — Z20828 Contact with and (suspected) exposure to other viral communicable diseases: Secondary | ICD-10-CM | POA: Diagnosis present

## 2018-12-31 DIAGNOSIS — K219 Gastro-esophageal reflux disease without esophagitis: Secondary | ICD-10-CM | POA: Diagnosis present

## 2018-12-31 LAB — PROTIME-INR
INR: 1.3 — ABNORMAL HIGH (ref 0.8–1.2)
Prothrombin Time: 15.6 seconds — ABNORMAL HIGH (ref 11.4–15.2)

## 2018-12-31 LAB — HIV ANTIBODY (ROUTINE TESTING W REFLEX): HIV Screen 4th Generation wRfx: NONREACTIVE

## 2018-12-31 MED ORDER — SIMETHICONE 80 MG PO CHEW: 40.0000 mg | CHEWABLE_TABLET | Freq: Four times a day (QID) | ORAL | Status: DC | PRN

## 2018-12-31 MED ORDER — ONDANSETRON 4 MG PO TBDP: 4.0000 mg | ORAL_TABLET | Freq: Four times a day (QID) | ORAL | Status: DC | PRN

## 2018-12-31 MED ORDER — ONDANSETRON HCL 4 MG/2ML IJ SOLN
4.0000 mg | Freq: Four times a day (QID) | INTRAMUSCULAR | Status: DC | PRN
  Administered 2018-12-31: 4 mg via INTRAVENOUS
  Filled 2018-12-31: qty 2

## 2018-12-31 MED ORDER — SODIUM CHLORIDE 0.9% FLUSH: 10.0000 mL | INTRAVENOUS | Status: DC | PRN

## 2018-12-31 MED ORDER — MORPHINE SULFATE (PF) 4 MG/ML IV SOLN
4.0000 mg | INTRAVENOUS | Status: DC | PRN
  Administered 2018-12-31 (×3): 4 mg via INTRAVENOUS
  Filled 2018-12-31 (×3): qty 1

## 2018-12-31 MED ORDER — METOPROLOL TARTRATE 5 MG/5ML IV SOLN: 5.0000 mg | Freq: Four times a day (QID) | INTRAVENOUS | Status: DC | PRN

## 2018-12-31 MED ORDER — ONDANSETRON HCL 4 MG/2ML IJ SOLN: 4.0000 mg | Freq: Four times a day (QID) | INTRAMUSCULAR | Status: DC | PRN

## 2018-12-31 MED ORDER — LACTATED RINGERS IV SOLN
INTRAVENOUS | Status: DC
  Administered 2018-12-31: 14:00:00 via INTRAVENOUS

## 2018-12-31 MED ORDER — DIPHENHYDRAMINE HCL 50 MG/ML IJ SOLN
12.5000 mg | Freq: Four times a day (QID) | INTRAMUSCULAR | Status: DC | PRN
  Administered 2018-12-31: 12.5 mg via INTRAVENOUS
  Filled 2018-12-31: qty 1

## 2018-12-31 MED ORDER — DIPHENHYDRAMINE HCL 12.5 MG/5ML PO ELIX: 12.5000 mg | ORAL_SOLUTION | Freq: Four times a day (QID) | ORAL | Status: DC | PRN

## 2018-12-31 MED ORDER — MORPHINE SULFATE (PF) 2 MG/ML IV SOLN
2.0000 mg | INTRAVENOUS | Status: DC | PRN
  Administered 2018-12-31 – 2019-01-01 (×2): 2 mg via INTRAVENOUS
  Filled 2018-12-31 (×2): qty 1

## 2018-12-31 MED ORDER — CHLORHEXIDINE GLUCONATE CLOTH 2 % EX PADS
6.0000 | MEDICATED_PAD | Freq: Every day | CUTANEOUS | Status: DC
  Administered 2019-01-01 (×2): 6 via TOPICAL

## 2018-12-31 MED ORDER — ENOXAPARIN SODIUM 40 MG/0.4ML ~~LOC~~ SOLN
40.0000 mg | SUBCUTANEOUS | Status: DC
  Administered 2018-12-31: 23:00:00 40 mg via SUBCUTANEOUS
  Filled 2018-12-31: qty 0.4

## 2018-12-31 NOTE — H&P (Addendum)
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 Stark [name]  Chief Complaint    Wound Check      [name] is a 43 y.o. female.  HPI: 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 Clovis [name] 08/2018. She did well after that surgery and went to get her colostomy reversed 11/16 at [facility]. She says she was discharged on POD 4 and was eating, drinking, and having bowel function. She noted fevers over the weekend and developed nausea and vomiting, and also noted stool coming from her prior colostomy site.  She came to the ED on 11/23 and has been awaiting transfer since that time to [facility]. Dr. Stark [name] asked me to see the patient today as it has been 2 days and she has not transferred.    She reports that she is doing better. Her abdominal pain had been on the right side, and her stool has been controlled with a colostomy bag from her prior site. She has had a CT and this demonstrated the colocutaneous fistula. She has been on antibiotics since arriving in the ED and has an NG in place. She denies any bowel function from below.  She has about 800cc of dark output in her NG canister.   Since the patient had been in the ED so long, I called Dr. Clovis [name] to discuss her case to see how we could assist. 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    Past Medical History:  Diagnosis Date  . ADD (attention deficit disorder)   . Anemia, iron deficiency   . Anxiety   . Colon cancer ([place]) 08/2013  . Depression   . Difficulty swallowing pills   . GERD (gastroesophageal reflux disease)   . Headache    migraines "long time ago"  . History of blood transfusion 08/16/2013  . History of migraine    no problems in "a long time"  . 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 Restless leg syndrome   . Seizures ([place])    last seizure 08/2012    Past Surgical History:  Procedure Laterality Date  . ABDOMINAL HYSTERECTOMY  08/24/2004   partial  . APPENDECTOMY  08/24/2004  . COLON SURGERY  08/11/2013   removed a foot of colon  . COLONOSCOPY  2019  . colonoscopy 10-18-14    . COLONOSCOPY WITH PROPOFOL  07/07/2013  . CYSTOSCOPY  11/01/2003  . ENDOMETRIAL FULGURATION  11/01/2003  . ESOPHAGOGASTRODUODENOSCOPY  07/07/2013  . LAPAROSCOPIC LYSIS OF ADHESIONS  11/01/2003  . LAPAROSCOPIC PARTIAL COLECTOMY Right 08/11/2013   Procedure: LAPAROSCOPIC RIGHT HEMICOLECTOMY;  Surgeon: Emily Klein, MD;  Location: [place];  Service: General;  Laterality: Right;  . LAPAROTOMY N/A 12/28/2014   Procedure: EXPLORATORY LAPAROTOMY;  Surgeon: Everitt Amber, MD;  Location: [facility];  Service: Gynecology;  Laterality: N/A;  . LEFT OOPHORECTOMY  08/24/2004  . PANNICULECTOMY  08/24/2004  . PORT-A-CATH REMOVAL Left 06/08/2014   Procedure: REMOVAL PORT-A-CATH;  Surgeon: Emily Klein, MD;  Location: [place];  Service: General;  Laterality: Left;  . PORTACATH PLACEMENT Left 09/09/2013   Procedure: INSERTION PORT-A-CATH;  Surgeon: Dorris Fetch  Barry Dienes, MD;  Location: [place];  Service: General;  Laterality: Left;  . SALPINGOOPHORECTOMY Right 12/28/2014   Procedure: RIGHT SALPINGO OOPHORECTOMY;  Surgeon: Everitt Amber, MD;  Location: [facility];  Service: Gynecology;  Laterality: Right;  . TUBAL LIGATION  11/07/2000  . UNILATERAL SALPINGECTOMY Left 08/24/2004  . WISDOM TOOTH EXTRACTION Bilateral 1994    Family History  Problem Relation Age of Onset  . Colon polyps Mother        3+ colon polyps at each colonoscopy - "several"  . Colitis Mother   . Heart attack Mother   . Diabetes Mother   . Heart disease Mother   . Diabetes  Father   . Heart attack Father   . Stroke Father   . Congestive Heart Failure Father   . Heart disease Father   . Cirrhosis Maternal Aunt   . Lung cancer Maternal Uncle        d. 56s; smoker and worked around asbestos  . Other Paternal Grandfather        "bone cancer"  . Colon cancer Maternal Uncle        dx early 59s  . Breast cancer Maternal Aunt        dx 50s-60s; s/p lumpectomy  . Dementia Maternal Aunt   . Other Cousin        maternal 1st cousin w/ "lung issues"  . Dementia Paternal Aunt   . Dementia Paternal Uncle   . Prostate cancer Paternal Uncle 40  . Esophageal cancer Neg Hx   . Rectal cancer Neg Hx   . Stomach cancer Neg Hx     Social History   Tobacco Use  . Smoking status: Never Smoker  . Smokeless tobacco: Never Used  Substance Use Topics  . Alcohol use: No  . Drug use: No    Medications:  I have reviewed the patient's current medications. Prior to Admission:  Medications Prior to Admission  Medication Sig Dispense Refill Last Dose  . acetaminophen (TYLENOL) 500 MG tablet Take 1,000 mg by mouth every 6 (six) hours as needed.   12/29/2018 at Unknown time  . alum & mag hydroxide-simeth (MAALOX/MYLANTA) 200-200-20 MG/5ML suspension Take 30 mLs by mouth every 6 (six) hours as needed for indigestion or heartburn.   12/29/2018 at Unknown time  . diphenhydrAMINE (BENADRYL) 25 MG tablet Take 25 mg by mouth every 6 (six) hours as needed for itching or allergies (Patient takes with oxycodone to prevent reaction).   12/28/2018  . Ibuprofen-diphenhydrAMINE HCl 200-25 MG CAPS Take 1 tablet by mouth at bedtime.   12/28/2018  . polyethylene glycol (MIRALAX / GLYCOLAX) 17 g packet Take 17 g by mouth daily.   12/29/2018 at Unknown time   Scheduled: . enoxaparin (LOVENOX) injection  40 mg Subcutaneous Q24H   Continuous: . lactated ringers 75 mL/hr at 12/31/18 1348  . piperacillin-tazobactam (ZOSYN)  IV 3.375 g (12/31/18 1623)   [id number] **OR**  diphenhydrAMINE, metoprolol tartrate, morphine injection, ondansetron **OR** ondansetron (ZOFRAN) IV, simethicone    ROS:  A comprehensive review of systems was negative except for: Constitutional: positive for fevers Gastrointestinal: positive for abdominal pain, nausea, vomiting and stool output in bag  Blood pressure (!) 152/72, pulse 84, temperature 99.2 F (37.3 C), temperature source Oral, resp. rate 16, height 5\' 6"  (1.676 m), weight 63.5 kg, SpO2 98 %. Physical Exam Vitals signs reviewed.  Constitutional:      Appearance: Normal appearance.  HENT:     Head: Normocephalic and atraumatic.  Nose: Nose normal.     Mouth/Throat:     Mouth: Mucous membranes are moist.  Eyes:     Extraocular Movements: Extraocular movements intact.     Pupils: Pupils are equal, round, and reactive to light.  Neck:     Musculoskeletal: Normal range of motion.  Cardiovascular:     Rate and Rhythm: Normal rate and regular rhythm.  Pulmonary:     Effort: Pulmonary effort is normal.     Breath sounds: Normal breath sounds.  Abdominal:     General: There is no distension.     Palpations: Abdomen is soft.     Tenderness: There is no abdominal tenderness.     Comments: Midline healing, ostomy site with bag over and stool  Musculoskeletal: Normal range of motion.        General: No swelling.  Skin:    General: Skin is warm and dry.  Neurological:     General: No focal deficit present.     Mental Status: She is alert and oriented to person, place, and time.  Psychiatric:        Mood and Affect: Mood normal.        Behavior: Behavior normal.        Thought Content: Thought content normal.        Judgment: Judgment normal.     Results: Results for orders placed or performed during the hospital encounter of 12/29/18 (from the past 48 hour(s))  SARS CORONAVIRUS 2 (TAT 6-24 HRS) Nasopharyngeal Nasopharyngeal Swab     Status: None   Collection Time: 12/29/18 11:39 PM   Specimen:  Nasopharyngeal Swab  Result Value Ref Range   SARS Coronavirus 2 NEGATIVE NEGATIVE    Comment: (NOTE) SARS-CoV-2 target nucleic acids are NOT DETECTED. The SARS-CoV-2 RNA is generally detectable in upper and lower respiratory specimens during the acute phase of infection. Negative results do not preclude SARS-CoV-2 infection, do not rule out co-infections with other pathogens, and should not be used as the sole basis for treatment or other patient management decisions. Negative results must be combined with clinical observations, patient history, and epidemiological information. The expected result is Negative. Fact Sheet for Patients: SugarRoll.be Fact Sheet for Healthcare Providers: https://www.woods-mathews.com/ This test is not yet approved or cleared by the [place] FDA and  has been authorized for detection and/or diagnosis of SARS-CoV-2 by FDA under an Emergency Use Authorization (EUA). This EUA will remain  in effect (meaning this test can be used) for the duration of the COVID-19 declaration under Section 56 4(b)(1) of the Act, 21 U.S.C. section 360bbb-3(b)(1), unless the authorization is terminated or revoked sooner. Performed at [facility], [place] [address] [place], [place] [address]   Comprehensive metabolic panel     Status: Abnormal   Collection Time: 12/29/18 11:56 PM  Result Value Ref Range   Sodium 133 (L) 135 - 145 mmol/L   Potassium 3.9 3.5 - 5.1 mmol/L   Chloride 94 (L) 98 - 111 mmol/L   CO2 25 22 - 32 mmol/L   Glucose, Bld 181 (H) 70 - 99 mg/dL   BUN 22 (H) 6 - 20 mg/dL   Creatinine, Ser 0.96 0.44 - 1.00 mg/dL   Calcium 9.9 8.9 - 10.3 mg/dL   Total Protein 8.3 (H) 6.5 - 8.1 g/dL   Albumin 3.9 3.5 - 5.0 g/dL   AST 14 (L) 15 - 41 U/L   ALT 12 0 - 44 U/L   Alkaline Phosphatase 71 38 -  126 U/L   Total Bilirubin 1.3 (H) 0.3 - 1.2 mg/dL   GFR calc non Af Amer >60 >60 mL/min   GFR calc Af Amer >60 >60  mL/min   Anion gap 14 5 - 15    Comment: Performed at [facility], [address] [place], [place] [address]  CBC with Differential     Status: Abnormal   Collection Time: 12/29/18 11:56 PM  Result Value Ref Range   WBC 5.5 4.0 - 10.5 K/uL   RBC 4.52 3.87 - 5.11 MIL/uL   Hemoglobin 11.1 (L) 12.0 - 15.0 g/dL   HCT 34.9 (L) 36.0 - 46.0 %   MCV 77.2 (L) 80.0 - 100.0 fL   MCH 24.6 (L) 26.0 - 34.0 pg   MCHC 31.8 30.0 - 36.0 g/dL   RDW 14.2 11.5 - 15.5 %   Platelets 201 150 - 400 K/uL   nRBC 0.0 0.0 - 0.2 %   Neutrophils Relative % 84 %   Neutro Abs 4.7 1.7 - 7.7 K/uL   Lymphocytes Relative 7 %   Lymphs Abs 0.4 (L) 0.7 - 4.0 K/uL   Monocytes Relative 8 %   Monocytes Absolute 0.4 0.1 - 1.0 K/uL   Eosinophils Relative 0 %   Eosinophils Absolute 0.0 0.0 - 0.5 K/uL   Basophils Relative 0 %   Basophils Absolute 0.0 0.0 - 0.1 K/uL   Immature Granulocytes 1 %   Abs Immature Granulocytes 0.03 0.00 - 0.07 K/uL    Comment: Performed at [facility], [address] [place], [place] [address]  CBC with Differential     Status: Abnormal   Collection Time: 12/30/18  6:52 PM  Result Value Ref Range   WBC 3.3 (L) 4.0 - 10.5 K/uL   RBC 3.88 3.87 - 5.11 MIL/uL   Hemoglobin 9.4 (L) 12.0 - 15.0 g/dL   HCT 30.8 (L) 36.0 - 46.0 %   MCV 79.4 (L) 80.0 - 100.0 fL   MCH 24.2 (L) 26.0 - 34.0 pg   MCHC 30.5 30.0 - 36.0 g/dL   RDW 14.6 11.5 - 15.5 %   Platelets 169 150 - 400 K/uL   nRBC 0.0 0.0 - 0.2 %   Neutrophils Relative % 70 %   Neutro Abs 2.3 1.7 - 7.7 K/uL   Lymphocytes Relative 20 %   Lymphs Abs 0.6 (L) 0.7 - 4.0 K/uL   Monocytes Relative 9 %   Monocytes Absolute 0.3 0.1 - 1.0 K/uL   Eosinophils Relative 1 %   Eosinophils Absolute 0.0 0.0 - 0.5 K/uL   Basophils Relative 0 %   Basophils Absolute 0.0 0.0 - 0.1 K/uL   Immature Granulocytes 0 %   Abs Immature Granulocytes 0.01 0.00 - 0.07 K/uL    Comment: Performed at [facility], [address], [place], [place] [address]  Comprehensive  metabolic panel     Status: Abnormal   Collection Time: 12/30/18  6:52 PM  Result Value Ref Range   Sodium 134 (L) 135 - 145 mmol/L   Potassium 3.6 3.5 - 5.1 mmol/L   Chloride 101 98 - 111 mmol/L   CO2 26 22 - 32 mmol/L   Glucose, Bld 99 70 - 99 mg/dL   BUN 20 6 - 20 mg/dL   Creatinine, Ser 0.76 0.44 - 1.00 mg/dL   Calcium 8.5 (L) 8.9 - 10.3 mg/dL   Total Protein 6.5 6.5 - 8.1 g/dL   Albumin 3.1 (L) 3.5 - 5.0 g/dL   AST 11 (L)  15 - 41 U/L   ALT 10 0 - 44 U/L   Alkaline Phosphatase 55 38 - 126 U/L   Total Bilirubin 0.9 0.3 - 1.2 mg/dL   GFR calc non Af Amer >60 >60 mL/min   GFR calc Af Amer >60 >60 mL/min   Anion gap 7 5 - 15    Comment: Performed at [facility], [address], [place], [place] [address]   Personally reviewed CT and reviewed with Dr. Thornton Papas, radiology- collection anterior to colon anastomosis, air and fluid, appears to be walling off, other post operative fluid in left gutter and over liver / gastric but do not appear infected at this time; anastomosis appears patent, dilation of bowel proximal but no signs of obstruction and likely more ileus   Ct Abdomen Pelvis W Contrast  Result Date: 12/30/2018 CLINICAL DATA:  History of recent colostomy reversal EXAM: CT ABDOMEN AND PELVIS WITH CONTRAST TECHNIQUE: Multidetector CT imaging of the abdomen and pelvis was performed using the standard protocol following bolus administration of intravenous contrast. CONTRAST:  171mL OMNIPAQUE IOHEXOL 300 MG/ML  SOLN COMPARISON:  08/07/2018 FINDINGS: Lower chest: No acute abnormality. Hepatobiliary: Liver demonstrates a normal enhancement pattern with the exception of multiple rounded hypodensities the largest of which lies in the medial segment of the left lobe of the liver measuring 2.6 cm consistent with metastatic disease. The gallbladder is within normal limits. Pancreas: Pancreas is unremarkable. Spleen: Spleen is mildly prominent without focal mass. Adrenals/Urinary Tract: Adrenal  glands are within normal limits. Kidneys are well visualized bilaterally. Normal enhancement is seen. Fullness of the collecting systems is seen bilaterally. Slight delay in enhancement on the left is noted compared to the right right. No definitive stone is seen. The obstructive changes are likely related to underlying scarring. Stomach/Bowel: Stomach is decompressed. Some perigastric fluid is noted which was not seen on the prior exam. These fluid collections may be related to postoperative change and seromas. Multiple dilated loops of small bowel are noted within the mid abdomen. No definitive transition zone is seen. The anastomosis in the right lower quadrant is stable and widely patent. These changes may be related to the postsurgical changes related to the recent loop colostomy closure. There is inflammatory change in the surgical bed as well as fluid and air extrinsic to the bowel loops just below the wound. These changes are consistent with perforation and day and enterocutaneous fistula. Vascular/Lymphatic: No significant vascular findings are present. No enlarged abdominal or pelvic lymph nodes. Reproductive: Status post hysterectomy. No adnexal masses. Other: Free fluid is noted within the pelvis along the anterior aspect of the sacrum. These changes are new from the prior exam. This is likely related to the perforation of the colon anteriorly. Additionally an undeveloped somewhat ovoid collection of air and fluid is noted in the left pericolic gutter measuring 5.4 x 2.4 cm also consistent with a postoperative fluid collection. This may represent a developing abscess. Musculoskeletal: Degenerative changes of lumbar spine are seen. No acute bony abnormality is noted. IMPRESSION: Postsurgical changes in the anterior abdomen consistent with the recent loop colostomy closure. Anterior to the colonic postsurgical changes, there is a focal air and fluid collection which measures 3.9 x 1.9 cm in greatest  transverse and AP dimension and 7 cm in craniocaudad dimension consistent with focal perforation and stool leakage. This extends through the anterior abdominal wall consistent with the given clinical history. This is consistent with an enterocutaneous fistula. Fluid collections are noted in the left pericolic  gutter as well as adjacent to the stomach both anteriorly and posteriorly likely related to postoperative fluid collections. This may represent developing abscesses. Multiple dilated loops of small bowel, likely a reactive ileus as no definitive transition zone is seen. Dilatation of the collecting systems of the kidneys bilaterally left greater than right likely related to distal pressure on the ureters as no definitive stones are seen. Changes consistent with multiple hepatic metastatic lesions. These were not present on the most recent available exam. Electronically Signed   By: Inez Catalina M.D.   On: 12/30/2018 01:29   Dg Chest Port 1 View  Result Date: 12/29/2018 CLINICAL DATA:  Vomiting. EXAM: PORTABLE CHEST 1 VIEW COMPARISON:  10/23/2010 FINDINGS: The heart size and mediastinal contours are within normal limits. Both lungs are clear. The visualized skeletal structures are unremarkable. IMPRESSION: No active disease. Electronically Signed   By: Constance Holster M.D.   On: 12/29/2018 23:54     Assessment & Plan:  BAMMA ANGELO is a 43 y.o. female with a history of metastatic CRC wit hcarcinomatosis s/p resection and HIPEC. 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  She is doing fair. Her pain is improved, and she has had output into a colostomy bag over her prior site at the colocutaneous fistula. On CT the area looks contained. She has no leukocytosis, and has been hemodynamically stable. There is no need for emergent surgical intervention. I have discussed with the patient the options for management including end colostomy if she was sick versus  controlling the fistula and doing PICC/ TPN. I have discussed with her surgeon, Dr. Clovis [name] at [facility], and he agrees with PICC/ TPN and trial of diet to see her output once she is better and has ileus resolving.   -Admit on telemetry  -PRN for pain -IS, OOB -Colostomy bag over site as needed -NPO, NG in place for now -PICC Ordered, INR ordered, TPN for tomorrow as we missed the mixing time today -Zosyn for intraabdominal contamination/ abscess /colocutaneous fistula  -Labs tomorrow -LR @ 75 -SCDs, lovenox  All questions were answered to the satisfaction of the patient and family.   Virl Cagey 12/31/2018, 1:07 PM

## 2018-12-31 NOTE — ED Notes (Signed)
Dr. Bridges in to see pt 

## 2018-12-31 NOTE — ED Notes (Signed)
NGT to left nares, to LIS.  Placed checked and Muellner drainage note in cannister.

## 2018-12-31 NOTE — Progress Notes (Signed)
Peripherally Inserted Central Catheter/Midline Placement  The IV Nurse has discussed with the patient and/or persons authorized to consent for the patient, the purpose of this procedure and the potential benefits and risks involved with this procedure.  The benefits include less needle sticks, lab draws from the catheter, and the patient may be discharged home with the catheter. Risks include, but not limited to, infection, bleeding, blood clot (thrombus formation), and puncture of an artery; nerve damage and irregular heartbeat and possibility to perform a PICC exchange if needed/ordered by physician.  Alternatives to this procedure were also discussed.  Bard Power PICC patient education guide, fact sheet on infection prevention and patient information card has been provided to patient /or left at bedside.    PICC/Midline Placement Documentation  PICC Double Lumen [id number] PICC Right Basilic 40 cm 1 cm (Active)  Indication for Insertion or Continuance of Line Administration of hyperosmolar/irritating solutions (i.e. TPN, Vancomycin, etc.) 12/31/18 1853  Exposed Catheter (cm) 1 cm 12/31/18 1853  Site Assessment Clean;Dry;Intact 12/31/18 1853  Lumen #1 Status Flushed;Saline locked;Blood return noted 12/31/18 1853  Lumen #2 Status Flushed;Saline locked;Blood return noted 12/31/18 1853  Dressing Type Transparent;Securing device 12/31/18 1853  Dressing Status Clean;Dry;Intact;Antimicrobial disc in place 12/31/18 1853  Dressing Intervention New dressing 12/31/18 1853  Dressing Change Due 01/07/19 12/31/18 1853       Enos Fling 12/31/2018, 6:54 PM

## 2018-12-31 NOTE — ED Notes (Signed)
Checked with [facility], [place].  States, "Pt is still on their wait list and will notify us as soon as room is available".  Nurse informed.

## 2018-12-31 NOTE — ED Notes (Signed)
[facility] called. Still no beds available for patient.

## 2018-12-31 NOTE — Progress Notes (Signed)
Receive order for PICC.  Plan on placing today.

## 2019-01-01 DIAGNOSIS — Z85038 Personal history of other malignant neoplasm of large intestine: Secondary | ICD-10-CM | POA: Diagnosis not present

## 2019-01-01 DIAGNOSIS — R933 Abnormal findings on diagnostic imaging of other parts of digestive tract: Secondary | ICD-10-CM | POA: Diagnosis not present

## 2019-01-01 DIAGNOSIS — Z8589 Personal history of malignant neoplasm of other organs and systems: Secondary | ICD-10-CM | POA: Diagnosis not present

## 2019-01-01 DIAGNOSIS — Z91048 Other nonmedicinal substance allergy status: Secondary | ICD-10-CM | POA: Diagnosis not present

## 2019-01-01 DIAGNOSIS — K9189 Other postprocedural complications and disorders of digestive system: Secondary | ICD-10-CM | POA: Diagnosis not present

## 2019-01-01 DIAGNOSIS — Z9049 Acquired absence of other specified parts of digestive tract: Secondary | ICD-10-CM | POA: Diagnosis not present

## 2019-01-01 DIAGNOSIS — Z888 Allergy status to other drugs, medicaments and biological substances status: Secondary | ICD-10-CM | POA: Diagnosis not present

## 2019-01-01 DIAGNOSIS — Z885 Allergy status to narcotic agent status: Secondary | ICD-10-CM | POA: Diagnosis not present

## 2019-01-01 DIAGNOSIS — Z882 Allergy status to sulfonamides status: Secondary | ICD-10-CM | POA: Diagnosis not present

## 2019-01-01 DIAGNOSIS — K632 Fistula of intestine: Secondary | ICD-10-CM | POA: Diagnosis not present

## 2019-01-01 DIAGNOSIS — K567 Ileus, unspecified: Secondary | ICD-10-CM | POA: Diagnosis not present

## 2019-01-01 DIAGNOSIS — Z90710 Acquired absence of both cervix and uterus: Secondary | ICD-10-CM | POA: Diagnosis not present

## 2019-01-01 DIAGNOSIS — E876 Hypokalemia: Secondary | ICD-10-CM | POA: Diagnosis not present

## 2019-01-01 DIAGNOSIS — E440 Moderate protein-calorie malnutrition: Secondary | ICD-10-CM | POA: Diagnosis not present

## 2019-01-01 DIAGNOSIS — Z880 Allergy status to penicillin: Secondary | ICD-10-CM | POA: Diagnosis not present

## 2019-01-01 DIAGNOSIS — Z809 Family history of malignant neoplasm, unspecified: Secondary | ICD-10-CM | POA: Diagnosis not present

## 2019-01-01 LAB — CBC WITH DIFFERENTIAL/PLATELET
Abs Immature Granulocytes: 0.04 10*3/uL (ref 0.00–0.07)
Basophils Absolute: 0 10*3/uL (ref 0.0–0.1)
Basophils Relative: 0 %
Eosinophils Absolute: 0.1 10*3/uL (ref 0.0–0.5)
Eosinophils Relative: 2 %
HCT: 27.1 % — ABNORMAL LOW (ref 36.0–46.0)
Hemoglobin: 8.3 g/dL — ABNORMAL LOW (ref 12.0–15.0)
Immature Granulocytes: 1 %
Lymphocytes Relative: 18 %
Lymphs Abs: 0.6 10*3/uL — ABNORMAL LOW (ref 0.7–4.0)
MCH: 24.1 pg — ABNORMAL LOW (ref 26.0–34.0)
MCHC: 30.6 g/dL (ref 30.0–36.0)
MCV: 78.8 fL — ABNORMAL LOW (ref 80.0–100.0)
Monocytes Absolute: 0.2 10*3/uL (ref 0.1–1.0)
Monocytes Relative: 7 %
Neutro Abs: 2.3 10*3/uL (ref 1.7–7.7)
Neutrophils Relative %: 72 %
Platelets: 141 10*3/uL — ABNORMAL LOW (ref 150–400)
RBC: 3.44 MIL/uL — ABNORMAL LOW (ref 3.87–5.11)
RDW: 14 % (ref 11.5–15.5)
WBC: 3.2 10*3/uL — ABNORMAL LOW (ref 4.0–10.5)
nRBC: 0 % (ref 0.0–0.2)

## 2019-01-01 LAB — BASIC METABOLIC PANEL
Anion gap: 16 — ABNORMAL HIGH (ref 5–15)
BUN: 17 mg/dL (ref 6–20)
CO2: 21 mmol/L — ABNORMAL LOW (ref 22–32)
Calcium: 8.9 mg/dL (ref 8.9–10.3)
Chloride: 100 mmol/L (ref 98–111)
Creatinine, Ser: 0.73 mg/dL (ref 0.44–1.00)
GFR calc Af Amer: >60 mL/min (ref >60)
GFR calc non Af Amer: >60 mL/min (ref >60)
Glucose, Bld: 65 mg/dL — ABNORMAL LOW (ref 70–99)
Potassium: 3.2 mmol/L — ABNORMAL LOW (ref 3.5–5.1)
Sodium: 137 mmol/L (ref 135–145)

## 2019-01-01 MED ORDER — TRAVASOL 10 % IV SOLN
INTRAVENOUS | Status: DC
  Filled 2019-01-01: qty 432

## 2019-01-01 MED ORDER — LACTATED RINGERS IV SOLN: INTRAVENOUS | Status: DC

## 2019-01-01 NOTE — Plan of Care (Signed)
  Problem: Education: Goal: Knowledge of General Education information will improve Description Including pain rating scale, medication(s)/side effects and non-pharmacologic comfort measures Outcome: Progressing   

## 2019-01-01 NOTE — Discharge Summary (Signed)
Physician Discharge Summary  Patient ID: [name] MRN: [id number] DOB/AGE: [id number] 45 y.o.  Admit date: 12/29/2018 Discharge date: 01/01/2019  Admission Diagnoses: Colocutaneous fistula/ Ileus   Discharge Diagnoses:  Active Problems:   Colocutaneous fistula   Ileus ([place])   Metastatic malignant neoplasm [facility])   Discharged Condition: fair  Hospital Course: [name] is a very sweet 43 yo with a history of recurrent metastatic colon cancer s/p LAR, diverting loop colostomy, cytoreductive surgery and HIPEC with Dr. Clovis Riley 08/2018 and subsequent colostomy reversal 12/22/18.  She left [facility] on POD 4, and reports she had a small BM prior to discharge but this could have been mucus.  She says that she went home and started having fevers and abdominal pain, nausea, vomiting, and developed stool from her prior colostomy site.  She came to the ED at [facility]. 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  She had a ostomy appliance applied to her abdominal wall where the prior colostomy had been to control the leakage.  She had no leukocytosis but a minor shift, and was started on antibiotics. She was hemodynamically stable.   She was accepted by [facility], but the sat in the ED for 2 days. The ED asked me to get involved in the patient's care on day 2. Her CT showed a collection of fluid and gas above her staple line that was contained and starting to wall off, a patent staple line, no obvious signs of obstruction, and dilated bowel consistent with ileus proximal. She had no po contrast with this CT.  I spoke with Dr. Clovis Riley when the ED consulted me, and discussed admission to [facility] and PICC/ TPN and attempting to get her NG out and possibly advancing to a low residual diet.    She was admitted to the floor yesterday, and received a PICC 12/31/18. We were going to initiate TPN 01/01/19. She was doing well on 11/26, had minimal NG output with approximately 50cc of  bilious output in the canister. She had no bowel function from below and reported emptying the colostomy bag 1 time.  She indicated to me that she never had a real BM at home, and I discussed with her the possibility that the staple line that looks patent on CT may not be. We discussed the option of a SBFT to determine if she was actually obstructed and if this caused the staple line to leak.  We discussed doing this in a few days after we allowed her bowels rest and had antibiotics going.   We were notified by [facility] that the patient had a bed, and we had originally thought the transfer had been canceled. I asked the patient her preference for staying at [facility] or going to [facility], and she opted to go so that she could be seen by Dr. Clovis Riley.     A CD of her CT scan has been sent with the patient.   Consults: None  Significant Diagnostic Studies:  CT: collection with gas and fluid above colostomy staple line, contained, staple line appears patent, dilated bowel proximal no definitive sign of obstruction   CBC EXTENDED Latest Ref Rng & Units 01/01/2019 12/30/2018 12/29/2018  WBC 4.0 - 10.5 K/uL 3.2(L) 3.3(L) 5.5  RBC 3.87 - 5.11 MIL/uL 3.44(L) 3.88 4.52  HGB 12.0 - 15.0 g/dL 8.3(L) 9.4(L) 11.1(L)  HCT 36.0 - 46.0 % 27.1(L) 30.8(L) 34.9(L)  PLT 150 - 400 K/uL 141(L) 169 201  NEUTROABS 1.7 - 7.7 K/uL 2.3 2.3 4.7  LYMPHSABS 0.7 - 4.0 K/uL 0.6(L) 0.6(L) 0.4(L)   Lab Results  Component Value Date   NA 137 01/01/2019   K 3.2 (L) 01/01/2019   CL 100 01/01/2019   CO2 21 (L) 01/01/2019   Lab Results  Component Value Date   CREATININE 0.73 01/01/2019    Treatments: IV hydration and antibiotics: Zosyn; NG tube placement; PICC Line Right arm 12/31/18; TPN was going to be started 01/01/19 (6pm)   Discharge Exam: Blood pressure 140/74, pulse 76, temperature 97.7 F (36.5 C), temperature source Oral, resp. rate 16, height 5' 5.98" (1.676 m), weight 66 kg, SpO2 100 %. General  appearance: alert, cooperative and no distress Resp: normal work of breathing GI: soft, nondistended, minimally tender around LUQ, colostomy bag with some liquid stool, minimal gas Extremities: extremities normal, atraumatic, no cyanosis or edema  Disposition: Discharge disposition: [place] Not Defined       Discharge Instructions    Increase activity slowly   Complete by: As directed      Allergies as of 01/01/2019      Reactions   Adhesive [tape] Hives, Shortness Of Breath, Swelling   Tolerates paper tape   Eggs Or Egg-derived Products Nausea And Vomiting   Metoclopramide Hcl Other (See Comments)   EXACERBATES RESTLESS LEG SYNDROME   Oxycodone-acetaminophen Hives   Sulfa Antibiotics Nausea And Vomiting   Penicillins Itching   Has patient had a PCN reaction causing immediate rash, facial/tongue/throat swelling, SOB or lightheadedness with hypotension: No Has patient had a PCN reaction causing severe rash involving mucus membranes or skin necrosis: No Has patient had a PCN reaction that required hospitalization No Has patient had a PCN reaction occurring within the last 10 years: No If all of the above answers are "NO", then may proceed with Cephalosporin use.      Medication List    STOP taking these medications   oxyCODONE 5 MG immediate release tablet Commonly known as: Oxy IR/ROXICODONE     TAKE these medications   acetaminophen 500 MG tablet Commonly known as: TYLENOL Take 1,000 mg by mouth every 6 (six) hours as needed.   alum & mag hydroxide-simeth 200-200-20 MG/5ML suspension Commonly known as: MAALOX/MYLANTA Take 30 mLs by mouth every 6 (six) hours as needed for indigestion or heartburn.   diphenhydrAMINE 25 MG tablet Commonly known as: BENADRYL Take 25 mg by mouth every 6 (six) hours as needed for itching or allergies (Patient takes with oxycodone to prevent reaction).   Ibuprofen-diphenhydrAMINE HCl 200-25 MG Caps Take 1  tablet by mouth at bedtime.   polyethylene glycol 17 g packet Commonly known as: MIRALAX / GLYCOLAX Take 17 g by mouth daily.        Signed: Virl Cagey 01/01/2019, 3:30 PM

## 2019-01-01 NOTE — Progress Notes (Signed)
[place] NOTE   Pharmacy Consult for TPN Indication: contained colocutaneous fistula after a loop colostomy reversal on 11/16- ileus  Patient Measurements: Height: 5' 5.98" (167.6 cm) Weight: 145 lb 8.1 oz (66 kg) IBW/kg (Calculated) : 59.26 TPN AdjBW (KG): 66 Body mass index is 23.5 kg/m.   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 Clovis Riley 08/2018.  Awaiting transfer to [place]:  Insulin requirements in the past 24 hours: 0 units Lytes: K 3.2.  Others WNL Renal: stable ID: Zosyn for intraabdominal contamination/abscess/coslcutaneous fistula   TPN Access: PICC 11/25 TPN start date: 11/26 Nutritional Goals ( RD recommendation upcoming): Estimated goals per pharmacy  KCal:    1550 Protein: 88 Fluid: 1920 mL/day  Goal TPN rate is 80 ml/hr   Current Nutrition:   Plan:  Initiate TPN at 40 mL/hr. This TPN provides 43 g of protein, 144 g of dextrose, and 19.2 g of lipids which provides 854 kCals per day, meeting ~50% of patient needs Electrolytes in TPN: standard Add MVI, trace elements  to TPN Decrease IVMF (D5, NS, etc.) to  35 ml/hr Monitor TPN labs   Margot Ables, PharmD Clinical Pharmacist 01/01/2019 2:20 PM

## 2019-01-01 NOTE — Progress Notes (Signed)
Set up transport with [facility] to have patient transported to [place] report to [place]. Patient is going to room C916.

## 2019-01-01 NOTE — Progress Notes (Signed)
[facility] team left with patient to deliver to [place] paperwork.

## 2019-01-12 ENCOUNTER — Other Ambulatory Visit: Payer: Self-pay

## 2019-01-12 ENCOUNTER — Inpatient Hospital Stay: Payer: BC Managed Care – PPO | Attending: Oncology | Admitting: Nurse Practitioner

## 2019-01-12 ENCOUNTER — Inpatient Hospital Stay: Payer: BC Managed Care – PPO

## 2019-01-12 ENCOUNTER — Encounter: Payer: Self-pay | Admitting: Nurse Practitioner

## 2019-01-12 VITALS — BP 134/84 | HR 96 | Temp 98.5°F | Resp 18 | Ht 65.0 in | Wt 144.8 lb

## 2019-01-12 DIAGNOSIS — K769 Liver disease, unspecified: Secondary | ICD-10-CM | POA: Insufficient documentation

## 2019-01-12 DIAGNOSIS — Z9221 Personal history of antineoplastic chemotherapy: Secondary | ICD-10-CM | POA: Diagnosis not present

## 2019-01-12 DIAGNOSIS — C182 Malignant neoplasm of ascending colon: Secondary | ICD-10-CM

## 2019-01-12 DIAGNOSIS — R970 Elevated carcinoembryonic antigen [CEA]: Secondary | ICD-10-CM | POA: Insufficient documentation

## 2019-01-12 DIAGNOSIS — Z85038 Personal history of other malignant neoplasm of large intestine: Secondary | ICD-10-CM | POA: Diagnosis not present

## 2019-01-12 LAB — CEA (IN HOUSE-CHCC): CEA (CHCC-In House): 44.48 ng/mL — ABNORMAL HIGH (ref 0.00–5.00)

## 2019-01-12 MED ORDER — LORAZEPAM 0.5 MG PO TABS: 0.5000 mg | ORAL_TABLET | Freq: Two times a day (BID) | ORAL | 0 refills | Status: DC | PRN

## 2019-01-12 NOTE — Progress Notes (Addendum)
[place] OFFICE PROGRESS NOTE   Diagnosis: Colon cancer  INTERVAL HISTORY:   [name] returns for follow-up. She underwent colostomy reversal at [facility] 12/22/2018. She was discharged on 12/25/2018. She presented to the emergency department 12/29/2018 with nausea/vomiting, wound dehiscence. CT abdomen/pelvis with findings consistent with an enterocutaneous fistula, ileus.  In addition there were multiple rounded hypodensities in the liver consistent with metastatic disease. She was transferred to Dr. Clovis Riley at [facility].  A wound device was placed over the [facility] fistula with output remaining low.  NG tube was removed on 01/02/2019 and diet was advanced.  She was discharged home 01/07/2019.  Overall she is feeling better.  No nausea or vomiting.  She has a good appetite.  She is having regular bowel movements.  She is having minimal drainage from the abdominal wound.  She feels the abdominal wound is smaller.  No fever.  She reports intermittent anxiety since the recent hospitalization.  Objective:  Vital signs in last 24 hours:  Blood pressure 134/84, pulse 96, temperature 98.5 F (36.9 C), temperature source Temporal, resp. rate 18, height 5' 5"  (1.651 m), weight 144 lb 12.8 oz (65.7 kg), SpO2 100 %.    HEENT: Geographic tongue.  No ulcers. GI: Abdomen soft and nontender.  No hepatomegaly.  Left abdomen wound with a small amount of thin liquid drainage in the collection bag. Vascular: No leg edema. Neuro: Alert and oriented.   Lab Results:  Lab Results  Component Value Date   WBC 3.2 (L) 01/01/2019   HGB 8.3 (L) 01/01/2019   HCT 27.1 (L) 01/01/2019   MCV 78.8 (L) 01/01/2019   PLT 141 (L) 01/01/2019   NEUTROABS 2.3 01/01/2019    Imaging:  No results found.  Medications: I have reviewed the patient's current medications.  Assessment/Plan: 1. 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  The tumor returned microsatellite stable with no loss of mismatch repair protein expression   APC mutated. No BRAF, KRAS, or NRAS mutation On Foundation 1 testing   Cycle 1 adjuvant FOLFOX 09/08/2013   Cycle 2 adjuvant FOLFOX 09/24/2013   Cycle 3 adjuvant FOLFOX 10/08/2013.   Cycle 4 adjuvant FOLFOX 10/22/2013.   Cycle 5 adjuvant FOLFOX 11/05/2013. Oxaliplatin held due to thrombocytopenia.  Cycle 6 FOLFOX 11/19/2013.  Cycle 7 FOLFOX 12/03/2013. Oxaliplatin held secondary to thrombocytopenia.  Cycle 8 FOLFOX 12/17/2013.  Cycle 9 FOLFOX 01/04/2014. Oxaliplatin held secondary to neutropenia.   Cycle 10 FOLFOX 01/21/2014. Oxaliplatin held secondary to thrombocytopenia.  Cycle 11 FOLFOX 02/04/2014  Cycle 12 FOLFOX 02/18/2014, oxaliplatin dose reduced secondary tothrombocytopenia  CT abdomen/pelvis 01/30/2014 revealed splenomegaly and no evidence of recurrent colon cancer  CT chest 04/07/2014 with a stable right lower lobe nodule and no evidence for metastatic disease, no nodules seen on the CT 11/26/2014  Markedly elevated CEA 11/24/2014  CT 11/26/2014 revealed a right pelvic mass, splenomegaly, small volume ascites  Right salpingo-oophorectomy 12/28/2014 with the pathology confirming metastatic colon cancer  CTs 03/23/2016-no evidence of recurrent or metastatic disease.  CT 11/26/2016-enlargement of a fluid density structure the right pelvic sidewall, no other evidence of metastatic disease  CT aspiration right pelvic cyst 12/19/2016.Cytology-BENIGN REACTIVE/REPARATIVE CHANGES.  CTs 06/05/2017-no evidence of metastatic disease, mild cirrhotic changes with splenomegaly  CTs 12/11/2017-recurrent cystic right adnexal mass, similar to on the CT 11/26/2016, stable mild splenomegaly  CTs 04/23/2018-enlargement of cystic right adnexal mass with mural nodularity, no other evidence of metastatic disease  Cytoreductive surgery/HIPEC with  mitomycin by Dr. Clovis Riley at [facility] 08/12/2018-R1 resection achieved. Cytoreduction included omentectomy, LAR, right salpingo-oophorectomy and left colonic gutter/pelvic stripping. Pathology on the rectum showed recurrent/metastatic adenocarcinoma, tumor 2.0 cm, predominantly involving the subserosa and muscularis propria of the colon, proximal and distal margins of resection were negative, vascular invasion present, metastatic carcinoma present in 1 out of 5 lymph nodes; omentum resection with no malignancy seen, no metastatic carcinoma identified in 1 lymph node examined; left gutter stripping positive metastatic adenocarcinoma; right ovary resection positive metastatic adenocarcinoma.  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 2. Mild elevation of the CEA beginning January 2016, normal on 05/19/2014   3. History of iron deficiency anemia  4. seizure disorder  5. history of depression  6. 4 mm right lower lobe nodule on a staging chest CT 09/08/2013 , stable on a CT 04/07/2014 7. Hospitalization 09/24/2013 through 09/26/2013 with fever and abdominal pain.  8. 09/24/2013 urine culture positive for coag negative staph.  9. 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 Splenomegaly noted on a CT scan 01/30/2014,persistent on repeat CTs 12. Colonoscopy 11/17/2018- flexible sigmoidoscopy per rectum with changes of mild diversion colitis.  Scope advanced for approximately 25 cm.  Most proximal portion had necrotic appearing debris.  Colostomy bag insufflated suggesting some type of communication between the pouch and the proximal colon.  Introduction of scope into the ostomy found to available directions.  1 toward the distal pouch with similar appearing necrotic debris encountered.  The other was about a 30 cm segment of normal-appearing  colonic mucosa to the level of the previous right hemicolectomy ileocolonic anastomosis.   Disposition: [name] appears stable.  She is recovering from the recent hospitalization with an enterocutaneous fistula and ileus.  The CT scan from that hospitalization also showed several new liver lesions.  We discussed potential etiologies of the liver lesions including abscess versus metastatic disease.  We discussed proceeding with a biopsy but feel she needs more time to recover before going ahead with this.  She is in agreement.  She has a follow-up appointment with Dr. Clovis Riley next week.  We will see her back with a repeat CEA on 02/03/2019.  For the anxiety she will try Ativan 0.5 mg twice daily as needed.  She understands she should not be driving while taking this.  Patient seen with Dr. Benay Spice.  CT images reviewed on the computer with [name].  25 minutes were spent face-to-face at today's visit with the majority of that time involved in counseling/coordination of care.    Ned Card ANP/GNP-BC   01/12/2019  3:34 PM This was a shared visit with Ned Card.  [name] underwent colostomy takedown.  She developed an enterocutaneous fistula and ileus.  The fistula appears to be healing.  A CT revealed new liver lesions.  We reviewed the CT images with [name].  She understands the probable diagnosis of progressive colon cancer involving the liver, though it is possible the CT findings represent abscesses or another benign etiology.  She will return for office visit and CEA on 02/03/2019 we will arrange for a diagnostic liver biopsy if the CEA remains elevated.  We briefly discussed systemic treatment options including repeat treatment with FOLFOX, FOLFIRI, and the addition of Panitumumab.  Julieanne Manson, MD

## 2019-01-14 ENCOUNTER — Telehealth: Payer: Self-pay | Admitting: Oncology

## 2019-01-14 NOTE — Telephone Encounter (Signed)
I talk with patient regarding schedule  

## 2019-02-03 ENCOUNTER — Inpatient Hospital Stay (HOSPITAL_BASED_OUTPATIENT_CLINIC_OR_DEPARTMENT_OTHER): Payer: BC Managed Care – PPO | Admitting: Oncology

## 2019-02-03 ENCOUNTER — Inpatient Hospital Stay: Payer: BC Managed Care – PPO

## 2019-02-03 ENCOUNTER — Other Ambulatory Visit: Payer: Self-pay

## 2019-02-03 VITALS — BP 142/79 | HR 78 | Temp 97.8°F | Resp 18 | Ht 65.0 in | Wt 149.5 lb

## 2019-02-03 DIAGNOSIS — K769 Liver disease, unspecified: Secondary | ICD-10-CM | POA: Diagnosis not present

## 2019-02-03 DIAGNOSIS — C182 Malignant neoplasm of ascending colon: Secondary | ICD-10-CM

## 2019-02-03 DIAGNOSIS — R970 Elevated carcinoembryonic antigen [CEA]: Secondary | ICD-10-CM | POA: Diagnosis not present

## 2019-02-03 DIAGNOSIS — Z9221 Personal history of antineoplastic chemotherapy: Secondary | ICD-10-CM | POA: Diagnosis not present

## 2019-02-03 DIAGNOSIS — Z85038 Personal history of other malignant neoplasm of large intestine: Secondary | ICD-10-CM | POA: Diagnosis not present

## 2019-02-03 NOTE — Progress Notes (Signed)
[place] OFFICE PROGRESS NOTE   Diagnosis: Colon cancer  INTERVAL HISTORY:   [name] returns for scheduled visit.  The abdominal wound has almost completely healed.  No drainage.  Her bowels are moving.  Good appetite.  She has returned to work.  She has malaise.  Objective:  Vital signs in last 24 hours:  Blood pressure (!) 142/79, pulse 78, temperature 97.8 F (36.6 C), temperature source Temporal, resp. rate 18, height 5' 5"  (1.651 m), weight 149 lb 8 oz (67.8 kg), SpO2 99 %.    Limited physical examination secondary to distancing with the Covid pandemic GI: No hepatomegaly, nontender, fullness in the lower mid abdominal exposed. Vascular: No leg edema     Lab Results:  Lab Results  Component Value Date   WBC 3.2 (L) 01/01/2019   HGB 8.3 (L) 01/01/2019   HCT 27.1 (L) 01/01/2019   MCV 78.8 (L) 01/01/2019   PLT 141 (L) 01/01/2019   NEUTROABS 2.3 01/01/2019    CMP  Lab Results  Component Value Date   NA 137 01/01/2019   K 3.2 (L) 01/01/2019   CL 100 01/01/2019   CO2 21 (L) 01/01/2019   GLUCOSE 65 (L) 01/01/2019   BUN 17 01/01/2019   CREATININE 0.73 01/01/2019   CALCIUM 8.9 01/01/2019   PROT 6.5 12/30/2018   ALBUMIN 3.1 (L) 12/30/2018   AST 11 (L) 12/30/2018   ALT 10 12/30/2018   ALKPHOS 55 12/30/2018   BILITOT 0.9 12/30/2018   GFRNONAA >60 01/01/2019   GFRAA >60 01/01/2019    Lab Results  Component Value Date   CEA1 44.48 (H) 01/12/2019     Medications: I have reviewed the patient's current medications.   Assessment/Plan: 1.  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  The tumor returned microsatellite stable with no loss of mismatch repair protein expression   APC mutated. No BRAF, KRAS, or NRAS mutation On Foundation 1 testing   Cycle 1 adjuvant FOLFOX 09/08/2013   Cycle 2 adjuvant FOLFOX 09/24/2013   Cycle 3  adjuvant FOLFOX 10/08/2013.   Cycle 4 adjuvant FOLFOX 10/22/2013.   Cycle 5 adjuvant FOLFOX 11/05/2013. Oxaliplatin held due to thrombocytopenia.  Cycle 6 FOLFOX 11/19/2013.  Cycle 7 FOLFOX 12/03/2013. Oxaliplatin held secondary to thrombocytopenia.  Cycle 8 FOLFOX 12/17/2013.  Cycle 9 FOLFOX 01/04/2014. Oxaliplatin held secondary to neutropenia.   Cycle 10 FOLFOX 01/21/2014. Oxaliplatin held secondary to thrombocytopenia.  Cycle 11 FOLFOX 02/04/2014  Cycle 12 FOLFOX 02/18/2014, oxaliplatin dose reduced secondary tothrombocytopenia  CT abdomen/pelvis 01/30/2014 revealed splenomegaly and no evidence of recurrent colon cancer  CT chest 04/07/2014 with a stable right lower lobe nodule and no evidence for metastatic disease, no nodules seen on the CT 11/26/2014  Markedly elevated CEA 11/24/2014  CT 11/26/2014 revealed a right pelvic mass, splenomegaly, small volume ascites  Right salpingo-oophorectomy 12/28/2014 with the pathology confirming metastatic colon cancer  CTs 03/23/2016-no evidence of recurrent or metastatic disease.  CT 11/26/2016-enlargement of a fluid density structure the right pelvic sidewall, no other evidence of metastatic disease  CT aspiration right pelvic cyst 12/19/2016.Cytology-BENIGN REACTIVE/REPARATIVE CHANGES.  CTs 06/05/2017-no evidence of metastatic disease, mild cirrhotic changes with splenomegaly  CTs 12/11/2017-recurrent cystic right adnexal mass, similar to on the CT 11/26/2016, stable mild splenomegaly  CTs 04/23/2018-enlargement of cystic right adnexal mass with mural nodularity, no other evidence of metastatic disease  Cytoreductive surgery/HIPEC with mitomycin by Dr. Clovis Riley at [facility] 08/12/2018-R1 resection achieved.  Cytoreduction included omentectomy, LAR, right salpingo-oophorectomy and left colonic gutter/pelvic stripping. Pathology on the rectum showed recurrent/metastatic adenocarcinoma, tumor 2.0 cm, predominantly involving the  subserosa and muscularis propria of the colon, proximal and distal margins of resection were negative, vascular invasion present, metastatic carcinoma present in 1 out of 5 lymph nodes; omentum resection with no malignancy seen, no metastatic carcinoma identified in 1 lymph node examined; left gutter stripping positive metastatic adenocarcinoma; right ovary resection positive metastatic adenocarcinoma.  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 2. Mild elevation of the CEA beginning January 2016, normal on 05/19/2014   3. History of iron deficiency anemia  4. seizure disorder  5. history of depression  6. 4 mm right lower lobe nodule on a staging chest CT 09/08/2013 , stable on a CT 04/07/2014 7. Hospitalization 09/24/2013 through 09/26/2013 with fever and abdominal pain.  8. 09/24/2013 urine culture positive for coag negative staph.  9. 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 Splenomegaly noted on a CT scan 01/30/2014,persistent on repeat CTs 12. Colonoscopy 11/17/2018- flexible sigmoidoscopy per rectum with changes of mild diversion colitis.  Scope advanced for approximately 25 cm.  Most proximal portion had necrotic appearing debris.  Colostomy bag insufflated suggesting some type of communication between the pouch and the proximal colon.  Introduction of scope into the ostomy found to available directions.  1 toward the distal pouch with similar appearing necrotic debris encountered.  The other was about a 30 cm segment of normal-appearing colonic mucosa to the level of the previous right hemicolectomy ileocolonic anastomosis.      Disposition: [name] appears well.  The abdominal wound has almost completely healed.  The CEA was higher when she was here several weeks ago.  The CEA from today.  The plan is to proceed with  a diagnostic liver biopsy if the CEA is more elevated.  She will return for an office visit on 02/23/2019.  We will discuss systemic treatment options if a diagnosis of metastatic colon cancer is confirmed.  She will be a candidate for repeat treatment with FOLFOX, FOLFIRI, and treatment with an EGFR inhibitor.  Betsy Coder, MD  02/03/2019  3:56 PM

## 2019-02-04 ENCOUNTER — Other Ambulatory Visit: Payer: Self-pay | Admitting: Oncology

## 2019-02-04 ENCOUNTER — Telehealth: Payer: Self-pay

## 2019-02-04 DIAGNOSIS — C182 Malignant neoplasm of ascending colon: Secondary | ICD-10-CM

## 2019-02-04 LAB — CEA (IN HOUSE-CHCC): CEA (CHCC-In House): 68.05 ng/mL — ABNORMAL HIGH (ref 0.00–5.00)

## 2019-02-04 NOTE — Telephone Encounter (Signed)
TC to pt per Ned Card NP to let her know that her cea is higher as expected, discussed yesterday, and to proceed with liver biopsy. I let her know that Lattie Haw ordered biopsy. Pt asked what CEA level was and I let her know that it was 68.05. Pt verbalized understanding. No further problems or concerns at this time.

## 2019-02-05 ENCOUNTER — Encounter (HOSPITAL_COMMUNITY): Payer: Self-pay | Admitting: Radiology

## 2019-02-05 ENCOUNTER — Telehealth: Payer: Self-pay | Admitting: Oncology

## 2019-02-05 NOTE — Progress Notes (Signed)
[name] [facility] Female, 43 y.o., 1976-02-04 MRN:  [id number] Phone:  [phone] Jerilynn Mages) PCP:  System, Provider Not In Coverage:  [employer] Ppo  RE: US Liver Biopsy Received: Yesterday Message Contents  Markus Daft, MD  Garth Bigness D  US guided biopsy of left hepatic lesion.   [place]       Previous Messages   ----- Message -----  From: Garth Bigness D  Sent: 02/04/2019  2:49 PM EST  To: [facility] Procedure Requests  Subject: US Liver Biopsy                  Procedure: US Liver Biopsy   Reason: Cancer of ascending colon, h/o metastatic colon cancer, new liver lesions,  Please biopsy liver lesion to confirm diagnosis of metastatic colon cancer   History: CT, NM PET, [place] EKG in ocmputer   Provider: [name]   Contact: [phone]

## 2019-02-05 NOTE — Telephone Encounter (Signed)
Scheduled per 12/29 los. Called and spoke with pt, confirmed 1/18 appt

## 2019-02-10 ENCOUNTER — Other Ambulatory Visit: Payer: Self-pay | Admitting: Radiology

## 2019-02-11 ENCOUNTER — Encounter (HOSPITAL_COMMUNITY): Payer: Self-pay

## 2019-02-11 ENCOUNTER — Ambulatory Visit (HOSPITAL_COMMUNITY)
Admission: RE | Admit: 2019-02-11 | Discharge: 2019-02-11 | Disposition: A | Discharge status: Home / Self Care | Payer: BC Managed Care – PPO | Source: Ambulatory Visit | Attending: Oncology | Admitting: Oncology

## 2019-02-11 ENCOUNTER — Other Ambulatory Visit: Payer: Self-pay

## 2019-02-11 DIAGNOSIS — C801 Malignant (primary) neoplasm, unspecified: Secondary | ICD-10-CM | POA: Diagnosis not present

## 2019-02-11 DIAGNOSIS — Z882 Allergy status to sulfonamides status: Secondary | ICD-10-CM | POA: Diagnosis not present

## 2019-02-11 DIAGNOSIS — Z8543 Personal history of malignant neoplasm of ovary: Secondary | ICD-10-CM | POA: Diagnosis not present

## 2019-02-11 DIAGNOSIS — C182 Malignant neoplasm of ascending colon: Secondary | ICD-10-CM | POA: Insufficient documentation

## 2019-02-11 DIAGNOSIS — Z888 Allergy status to other drugs, medicaments and biological substances status: Secondary | ICD-10-CM | POA: Insufficient documentation

## 2019-02-11 DIAGNOSIS — Z79899 Other long term (current) drug therapy: Secondary | ICD-10-CM | POA: Diagnosis not present

## 2019-02-11 DIAGNOSIS — Z791 Long term (current) use of non-steroidal anti-inflammatories (NSAID): Secondary | ICD-10-CM | POA: Diagnosis not present

## 2019-02-11 DIAGNOSIS — Z90721 Acquired absence of ovaries, unilateral: Secondary | ICD-10-CM | POA: Insufficient documentation

## 2019-02-11 DIAGNOSIS — Z90710 Acquired absence of both cervix and uterus: Secondary | ICD-10-CM | POA: Diagnosis not present

## 2019-02-11 DIAGNOSIS — F419 Anxiety disorder, unspecified: Secondary | ICD-10-CM | POA: Insufficient documentation

## 2019-02-11 DIAGNOSIS — Z9851 Tubal ligation status: Secondary | ICD-10-CM | POA: Diagnosis not present

## 2019-02-11 DIAGNOSIS — K7689 Other specified diseases of liver: Secondary | ICD-10-CM | POA: Diagnosis not present

## 2019-02-11 DIAGNOSIS — Z85038 Personal history of other malignant neoplasm of large intestine: Secondary | ICD-10-CM | POA: Diagnosis not present

## 2019-02-11 DIAGNOSIS — Z880 Allergy status to penicillin: Secondary | ICD-10-CM | POA: Diagnosis not present

## 2019-02-11 DIAGNOSIS — C787 Secondary malignant neoplasm of liver and intrahepatic bile duct: Secondary | ICD-10-CM | POA: Diagnosis not present

## 2019-02-11 DIAGNOSIS — Z91012 Allergy to eggs: Secondary | ICD-10-CM | POA: Insufficient documentation

## 2019-02-11 DIAGNOSIS — R160 Hepatomegaly, not elsewhere classified: Secondary | ICD-10-CM | POA: Diagnosis not present

## 2019-02-11 LAB — PROTIME-INR
INR: 1 (ref 0.8–1.2)
Prothrombin Time: 12.7 seconds (ref 11.4–15.2)

## 2019-02-11 LAB — CBC WITH DIFFERENTIAL/PLATELET
Abs Immature Granulocytes: 0 10*3/uL (ref 0.00–0.07)
Basophils Absolute: 0 10*3/uL (ref 0.0–0.1)
Basophils Relative: 0 %
Eosinophils Absolute: 0.1 10*3/uL (ref 0.0–0.5)
Eosinophils Relative: 2 %
HCT: 34.1 % — ABNORMAL LOW (ref 36.0–46.0)
Hemoglobin: 10.4 g/dL — ABNORMAL LOW (ref 12.0–15.0)
Immature Granulocytes: 0 %
Lymphocytes Relative: 27 %
Lymphs Abs: 0.8 10*3/uL (ref 0.7–4.0)
MCH: 24.1 pg — ABNORMAL LOW (ref 26.0–34.0)
MCHC: 30.5 g/dL (ref 30.0–36.0)
MCV: 78.9 fL — ABNORMAL LOW (ref 80.0–100.0)
Monocytes Absolute: 0.3 10*3/uL (ref 0.1–1.0)
Monocytes Relative: 8 %
Neutro Abs: 1.9 10*3/uL (ref 1.7–7.7)
Neutrophils Relative %: 63 %
Platelets: 145 10*3/uL — ABNORMAL LOW (ref 150–400)
RBC: 4.32 MIL/uL (ref 3.87–5.11)
RDW: 15.2 % (ref 11.5–15.5)
WBC: 3 10*3/uL — ABNORMAL LOW (ref 4.0–10.5)
nRBC: 0 % (ref 0.0–0.2)

## 2019-02-11 LAB — COMPREHENSIVE METABOLIC PANEL
ALT: 12 U/L (ref 0–44)
AST: 22 U/L (ref 15–41)
Albumin: 4 g/dL (ref 3.5–5.0)
Alkaline Phosphatase: 66 U/L (ref 38–126)
Anion gap: 10 (ref 5–15)
BUN: 14 mg/dL (ref 6–20)
CO2: 25 mmol/L (ref 22–32)
Calcium: 9.7 mg/dL (ref 8.9–10.3)
Chloride: 108 mmol/L (ref 98–111)
Creatinine, Ser: 0.72 mg/dL (ref 0.44–1.00)
GFR calc Af Amer: >60 mL/min (ref >60)
GFR calc non Af Amer: >60 mL/min (ref >60)
Glucose, Bld: 98 mg/dL (ref 70–99)
Potassium: 3.8 mmol/L (ref 3.5–5.1)
Sodium: 143 mmol/L (ref 135–145)
Total Bilirubin: 0.5 mg/dL (ref 0.3–1.2)
Total Protein: 7.3 g/dL (ref 6.5–8.1)

## 2019-02-11 MED ORDER — MIDAZOLAM HCL 2 MG/2ML IJ SOLN
INTRAMUSCULAR | Status: AC | PRN
  Administered 2019-02-11 (×2): 1 mg via INTRAVENOUS

## 2019-02-11 MED ORDER — MIDAZOLAM HCL 2 MG/2ML IJ SOLN
INTRAMUSCULAR | Status: AC
  Filled 2019-02-11: qty 2

## 2019-02-11 MED ORDER — FENTANYL CITRATE (PF) 100 MCG/2ML IJ SOLN
INTRAMUSCULAR | Status: AC
  Filled 2019-02-11: qty 2

## 2019-02-11 MED ORDER — GELATIN ABSORBABLE 12-7 MM EX MISC
CUTANEOUS | Status: AC
  Filled 2019-02-11: qty 1

## 2019-02-11 MED ORDER — FENTANYL CITRATE (PF) 100 MCG/2ML IJ SOLN
INTRAMUSCULAR | Status: AC | PRN
  Administered 2019-02-11 (×2): 50 ug via INTRAVENOUS

## 2019-02-11 MED ORDER — LIDOCAINE HCL (PF) 1 % IJ SOLN
INTRAMUSCULAR | Status: AC | PRN
  Administered 2019-02-11: 10 mL

## 2019-02-11 MED ORDER — SODIUM CHLORIDE 0.9 % IV SOLN: INTRAVENOUS | Status: DC

## 2019-02-11 NOTE — Procedures (Signed)
Interventional Radiology Procedure Note  Procedure: US guided liver mass biopsy. Complications: None Recommendations:  - Ok to shower tomorrow - Do not submerge for 7 days - Routine care   Signed,  Benedicta Sultan S. Sarie Stall, DO    

## 2019-02-11 NOTE — H&P (Signed)
Referring Physician(s): [name]  Supervising Physician: Corrie Mckusick  Patient Status:  [facility]  Chief Complaint: "I'm having a liver biopsy"   Subjective: Patient familiar to IR service from aspiration of a right pelvic cyst in 2018.  She has a history of metastatic colon adenocarcinoma initially diagnosed in 2015, status post surgery and chemotherapy.  Latest CT on 12/30/2018 revealed multiple hepatic metastatic lesions.  Her CEA level is also increasing.  She presents today for image guided liver lesion biopsy for further evaluation.  She currently denies fever, headache, chest pain, dyspnea, cough, back pain, nausea, vomiting or bleeding.  She does have some mild intermittent abdominal discomfort at wound site from recent ostomy closure.  Past Medical History:  Diagnosis Date  . ADD (attention deficit disorder)   . Anemia, iron deficiency   . Anxiety   . Colon cancer ([place]) 08/2013  . Depression   . Difficulty swallowing pills   . GERD (gastroesophageal reflux disease)   . Headache    migraines "long time ago"  . History of blood transfusion 08/16/2013  . History of migraine    no problems in "a long time"  . 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 Restless leg syndrome   . Seizures ([place])    last seizure 08/2012   Past Surgical History:  Procedure Laterality Date  . ABDOMINAL HYSTERECTOMY  08/24/2004   partial  . APPENDECTOMY  08/24/2004  . COLON SURGERY  08/11/2013   removed a foot of colon  . COLONOSCOPY  2019  . colonoscopy 10-18-14    . COLONOSCOPY WITH PROPOFOL  07/07/2013  . CYSTOSCOPY  11/01/2003  . ENDOMETRIAL FULGURATION  11/01/2003  . ESOPHAGOGASTRODUODENOSCOPY  07/07/2013  . LAPAROSCOPIC LYSIS OF ADHESIONS  11/01/2003  . LAPAROSCOPIC PARTIAL COLECTOMY Right 08/11/2013   Procedure: LAPAROSCOPIC RIGHT HEMICOLECTOMY;  Surgeon: Stark Klein, MD;  Location: [facility];  Service: General;  Laterality: Right;  . LAPAROTOMY N/A 12/28/2014   Procedure:  EXPLORATORY LAPAROTOMY;  Surgeon: Everitt Amber, MD;  Location: [facility];  Service: Gynecology;  Laterality: N/A;  . LEFT OOPHORECTOMY  08/24/2004  . PANNICULECTOMY  08/24/2004  . PORT-A-CATH REMOVAL Left 06/08/2014   Procedure: REMOVAL PORT-A-CATH;  Surgeon: Stark Klein, MD;  Location: [facility];  Service: General;  Laterality: Left;  . PORTACATH PLACEMENT Left 09/09/2013   Procedure: INSERTION PORT-A-CATH;  Surgeon: Stark Klein, MD;  Location: [facility];  Service: General;  Laterality: Left;  . SALPINGOOPHORECTOMY Right 12/28/2014   Procedure: RIGHT SALPINGO OOPHORECTOMY;  Surgeon: Everitt Amber, MD;  Location: [facility];  Service: Gynecology;  Laterality: Right;  . TUBAL LIGATION  11/07/2000  . UNILATERAL SALPINGECTOMY Left 08/24/2004  . WISDOM TOOTH EXTRACTION Bilateral 1994      Allergies: Adhesive [tape], Eggs or egg-derived products, Metoclopramide hcl, Oxycodone-acetaminophen, Sulfa antibiotics, and Penicillins  Medications: Prior to Admission medications   Medication Sig Start Date End Date Taking? Authorizing Provider  acetaminophen (TYLENOL) 500 MG tablet Take 1,000 mg by mouth every 6 (six) hours as needed.   Yes [provider]  Ibuprofen-diphenhydrAMINE HCl 200-25 MG CAPS Take 1 tablet by mouth at bedtime. 09/29/18  Yes [provider]  LORazepam (ATIVAN) 0.5 MG tablet Take 1 tablet (0.5 mg total) by mouth 2 (two) times daily as needed for anxiety. 01/12/19  Yes Owens Shark, NP  alum & mag hydroxide-simeth (MAALOX/MYLANTA) 200-200-20 MG/5ML suspension Take 30 mLs by mouth every 6 (six) hours as needed for indigestion or heartburn.  [provider]  diphenhydrAMINE (BENADRYL) 25 MG tablet Take 25 mg by mouth every 6 (six) hours as needed for itching or allergies (Patient takes with oxycodone to prevent reaction).    [provider]  polyethylene glycol (MIRALAX / GLYCOLAX) 17 g packet Take 17 g by mouth daily. 12/26/18   [provider]     Vital Signs: Blood pressure 147/83, temperature 98.8, heart rate 77, respiration 14, O2 sat 100% room air   Physical Exam awake, alert.  Chest clear to auscultation bilaterally.  Heart with regular rate and rhythm.  Abdomen soft, positive bowel sounds, some mild abdominal tenderness at site of previous wound from colostomy closure; lower extremity edema  Imaging: No results found.  Labs:  CBC: Recent Labs    09/22/18 1547 12/29/18 2356 12/30/18 1852 01/01/19 0458  WBC 3.8* 5.5 3.3* 3.2*  HGB 9.1* 11.1* 9.4* 8.3*  HCT 29.9* 34.9* 30.8* 27.1*  PLT 242 201 169 141*    COAGS: Recent Labs    12/31/18 1327  INR 1.3*    BMP: Recent Labs    09/08/18 1436 12/29/18 2356 12/30/18 1852 01/01/19 0458  NA 136 133* 134* 137  K 3.7 3.9 3.6 3.2*  CL 99 94* 101 100  CO2 27 25 26  21*  GLUCOSE 177* 181* 99 65*  BUN 8 22* 20 17  CALCIUM 9.3 9.9 8.5* 8.9  CREATININE 0.71 0.96 0.76 0.73  GFRNONAA >60 >60 >60 >60  GFRAA >60 >60 >60 >60    LIVER FUNCTION TESTS: Recent Labs    06/18/18 1005 09/08/18 1436 12/29/18 2356 12/30/18 1852  BILITOT 1.1 0.8 1.3* 0.9  AST 47* 12* 14* 11*  ALT 73* 11 12 10   ALKPHOS 66 65 71 55  PROT 7.2 6.9 8.3* 6.5  ALBUMIN 4.2 3.0* 3.9 3.1*    Assessment and Plan: Pt with history of metastatic colon adenocarcinoma initially diagnosed in 2015, status post surgery and chemotherapy.  Latest CT on 12/30/2018 revealed multiple hepatic metastatic lesions.  Her CEA level is also increasing.  She presents today for image guided liver lesion biopsy for further evaluation. Risks and benefits of procedure was discussed with the patient  including, but not limited to bleeding, infection, damage to adjacent structures or low yield requiring additional tests.  All of the questions were answered and there is agreement to proceed.  Consent signed and in chart.  LABS PENDING   Electronically Signed: D. Rowe Robert, PA-C 02/11/2019, 11:16 AM   I  spent a total of 25 minutes at the the patient's bedside AND on the patient's hospital floor or unit, greater than 50% of which was counseling/coordinating care for image guided liver lesion biopsy

## 2019-02-11 NOTE — Discharge Instructions (Signed)
Moderate Conscious Sedation, Adult, Care After °These instructions provide you with information about caring for yourself after your procedure. Your health care provider may also give you more specific instructions. Your treatment has been planned according to current medical practices, but problems sometimes occur. Call your health care provider if you have any problems or questions after your procedure. °What can I expect after the procedure? °After your procedure, it is common: °· To feel sleepy for several hours. °· To feel clumsy and have poor balance for several hours. °· To have poor judgment for several hours. °· To vomit if you eat too soon. °Follow these instructions at home: °For at least 24 hours after the procedure: ° °· Do not: °? Participate in activities where you could fall or become injured. °? Drive. °? Use heavy machinery. °? Drink alcohol. °? Take sleeping pills or medicines that cause drowsiness. °? Make important decisions or sign legal documents. °? Take care of children on your own. °· Rest. °Eating and drinking °· Follow the diet recommended by your health care provider. °· If you vomit: °? Drink water, juice, or soup when you can drink without vomiting. °? Make sure you have little or no nausea before eating solid foods. °General instructions °· Have a responsible adult stay with you until you are awake and alert. °· Take over-the-counter and prescription medicines only as told by your health care provider. °· If you smoke, do not smoke without supervision. °· Keep all follow-up visits as told by your health care provider. This is important. °Contact a health care provider if: °· You keep feeling nauseous or you keep vomiting. °· You feel light-headed. °· You develop a rash. °· You have a fever. °Get help right away if: °· You have trouble breathing. °This information is not intended to replace advice given to you by your health care provider. Make sure you discuss any questions you have  with your health care provider. °Document Revised: 01/04/2017 Document Reviewed: 05/14/2015 °Elsevier Patient Education © 2020 [employer] °Liver Biopsy, Care After °These instructions give you information on caring for yourself after your procedure. Your doctor may also give you more specific instructions. Call your doctor if you have any problems or questions after your procedure. °What can I expect after the procedure? °After the procedure, it is common to have: °· Pain and soreness where the biopsy was done. °· Bruising around the area where the biopsy was done. °· Sleepiness and be tired for a few days. °Follow these instructions at home: °Medicines °· Take over-the-counter and prescription medicines only as told by your doctor. °· If you were prescribed an antibiotic medicine, take it as told by your doctor. Do not stop taking the antibiotic even if you start to feel better. °· Do not take medicines such as aspirin and ibuprofen. These medicines can thin your blood. Do not take these medicines unless your doctor tells you to take them. °· If you are taking prescription pain medicine, take actions to prevent or treat constipation. Your doctor may recommend that you: °? Drink enough fluid to keep your pee (urine) clear or pale yellow. °? Take over-the-counter or prescription medicines. °? Eat foods that are high in fiber, such as fresh fruits and vegetables, whole grains, and beans. °? Limit foods that are high in fat and processed sugars, such as fried and sweet foods. °Caring for your cut °· Follow instructions from your doctor about how to take care of your cuts from surgery (incisions).   Make sure you: °? Wash your hands with soap and water before you change your bandage (dressing). If you cannot use soap and water, use hand sanitizer. °? Change your bandage as told by your doctor. °? Leave stitches (sutures), skin glue, or skin tape (adhesive) strips in place. They may need to stay in place for 2 weeks or  longer. If tape strips get loose and curl up, you may trim the loose edges. Do not remove tape strips completely unless your doctor says it is okay. °· Check your cuts every day for signs of infection. Check for: °? Redness, swelling, or more pain. °? Fluid or blood. °? Pus or a bad smell. °? Warmth. °· Do not take baths, swim, or use a hot tub until your doctor says it is okay to do so. °Activity ° °· Rest at home for 1-2 days or as told by your doctor. °? Avoid sitting for a long time without moving. Get up to take short walks every 1-2 hours. °· Return to your normal activities as told by your doctor. Ask what activities are safe for you. °· Do not do these things in the first 24 hours: °? Drive. °? Use machinery. °? Take a bath or shower. °· Do not lift more than 10 pounds (4.5 kg) or play contact sports for the first 2 weeks. °General instructions ° °· Do not drink alcohol in the first week after the procedure. °· Have someone stay with you for at least 24 hours after the procedure. °· Get your test results. Ask your doctor or the department that is doing the test: °? When will my results be ready? °? How will I get my results? °? What are my treatment options? °? What other tests do I need? °? What are my next steps? °· Keep all follow-up visits as told by your doctor. This is important. °Contact a doctor if: °· A cut bleeds and leaves more than just a small spot of blood. °· A cut is red, puffs up (swells), or hurts more than before. °· Fluid or something else comes from a cut. °· A cut smells bad. °· You have a fever or chills. °Get help right away if: °· You have swelling, bloating, or pain in your belly (abdomen). °· You get dizzy or faint. °· You have a rash. °· You feel sick to your stomach (nauseous) or throw up (vomit). °· You have trouble breathing, feel short of breath, or feel faint. °· Your chest hurts. °· You have problems talking or seeing. °· You have trouble with your balance or moving your  arms or legs. °Summary °· After the procedure, it is common to have pain, soreness, bruising, and tiredness. °· Your doctor will tell you how to take care of yourself at home. Change your bandage, take your medicines, and limit your activities as told by your doctor. °· Call your doctor if you have symptoms of infection. Get help right away if your belly swells, your cut bleeds a lot, or you have trouble talking or breathing. °This information is not intended to replace advice given to you by your health care provider. Make sure you discuss any questions you have with your health care provider. °Document Revised: 02/01/2017 Document Reviewed: 02/01/2017 °Elsevier Patient Education © 2020 Else[employer] Inc. ° °

## 2019-02-13 LAB — SURGICAL PATHOLOGY

## 2019-02-23 ENCOUNTER — Other Ambulatory Visit: Payer: Self-pay

## 2019-02-23 ENCOUNTER — Inpatient Hospital Stay: Payer: BC Managed Care – PPO | Attending: Oncology | Admitting: Nurse Practitioner

## 2019-02-23 ENCOUNTER — Encounter: Payer: Self-pay | Admitting: Nurse Practitioner

## 2019-02-23 VITALS — BP 127/98 | HR 83 | Temp 98.5°F | Resp 16 | Ht 66.0 in | Wt 152.0 lb

## 2019-02-23 DIAGNOSIS — Z7189 Other specified counseling: Secondary | ICD-10-CM | POA: Insufficient documentation

## 2019-02-23 DIAGNOSIS — C182 Malignant neoplasm of ascending colon: Secondary | ICD-10-CM | POA: Diagnosis not present

## 2019-02-23 DIAGNOSIS — C787 Secondary malignant neoplasm of liver and intrahepatic bile duct: Secondary | ICD-10-CM | POA: Diagnosis not present

## 2019-02-23 DIAGNOSIS — D509 Iron deficiency anemia, unspecified: Secondary | ICD-10-CM | POA: Insufficient documentation

## 2019-02-23 DIAGNOSIS — C7961 Secondary malignant neoplasm of right ovary: Secondary | ICD-10-CM | POA: Insufficient documentation

## 2019-02-23 MED ORDER — DOXYCYCLINE HYCLATE 100 MG PO TABS: 100.0000 mg | ORAL_TABLET | Freq: Two times a day (BID) | ORAL | 5 refills | Status: DC

## 2019-02-23 MED ORDER — PROCHLORPERAZINE MALEATE 10 MG PO TABS: 10.0000 mg | ORAL_TABLET | Freq: Four times a day (QID) | ORAL | 3 refills | Status: DC | PRN

## 2019-02-23 MED ORDER — LIDOCAINE-PRILOCAINE 2.5-2.5 % EX CREA: TOPICAL_CREAM | CUTANEOUS | 3 refills | Status: DC

## 2019-02-23 NOTE — Progress Notes (Signed)
START OFF PATHWAY REGIMEN - Colorectal   [id number]:FOLFIRI + Panitumumab q14d (**2 cycles per order sheet**):   A cycle is every 14 days:     Panitumumab      Irinotecan      Leucovorin      Fluorouracil      Fluorouracil   **Always confirm dose/schedule in your pharmacy ordering system**  Patient Characteristics: Distant Metastases, Nonsurgical Candidate, KRAS/NRAS Wild-Type (BRAF V600 Wild-Type/Unknown), Standard Cytotoxic Therapy, First Line Standard Cytotoxic Therapy, Right-sided Tumor, PS = 0,1, Bevacizumab Eligible Tumor Location: Colon Therapeutic Status: Distant Metastases Microsatellite/Mismatch Repair Status: MSS/pMMR BRAF Mutation Status: Wild-Type (no mutation) KRAS/NRAS Mutation Status: Wild-Type (no mutation) Preferred Therapy Approach: Standard Cytotoxic Therapy Standard Cytotoxic Line of Therapy: First Line Standard Cytotoxic Therapy ECOG Performance Status: 1 Primary Tumor Location: Right-sided Tumor Bevacizumab Eligibility: Eligible Intent of Therapy: Non-Curative / Palliative Intent, Discussed with Patient

## 2019-02-23 NOTE — Progress Notes (Addendum)
[facility] OFFICE PROGRESS NOTE   Diagnosis: Colon cancer  INTERVAL HISTORY:   [name] returns as scheduled.  Overall she feels well.  She has a good appetite.  She has occasional abdominal pain.  Bowels moving regularly.  No nausea or vomiting.  No bleeding.  No fever.  Objective:  Vital signs in last 24 hours:  Blood pressure (!) 127/98, pulse 83, temperature 98.5 F (36.9 C), resp. rate 16, height 5' 6"  (1.676 m), weight 152 lb (68.9 kg), SpO2 100 %.    HEENT: No thrush or ulcers. GI: Abdomen is nontender.  No hepatomegaly.  Left abdomen with fullness, nontender. Vascular: No leg edema. Neuro: Alert and oriented. Skin: Skin is erythematous in a tape distribution mid to left abdomen.   Lab Results:  Lab Results  Component Value Date   WBC 3.0 (L) 02/11/2019   HGB 10.4 (L) 02/11/2019   HCT 34.1 (L) 02/11/2019   MCV 78.9 (L) 02/11/2019   PLT 145 (L) 02/11/2019   NEUTROABS 1.9 02/11/2019    Imaging:  No results found.  Medications: I have reviewed the patient's current medications.  Assessment/Plan: 1.  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  The tumor returned microsatellite stable with no loss of mismatch repair protein expression   APC mutated. No BRAF, KRAS, or NRAS mutation On Foundation 1 testing   Cycle 1 adjuvant FOLFOX 09/08/2013   Cycle 2 adjuvant FOLFOX 09/24/2013   Cycle 3 adjuvant FOLFOX 10/08/2013.   Cycle 4 adjuvant FOLFOX 10/22/2013.   Cycle 5 adjuvant FOLFOX 11/05/2013. Oxaliplatin held due to thrombocytopenia.  Cycle 6 FOLFOX 11/19/2013.  Cycle 7 FOLFOX 12/03/2013. Oxaliplatin held secondary to thrombocytopenia.  Cycle 8 FOLFOX 12/17/2013.  Cycle 9 FOLFOX 01/04/2014. Oxaliplatin held secondary to neutropenia.   Cycle 10 FOLFOX 01/21/2014. Oxaliplatin held secondary to thrombocytopenia.  Cycle 11 FOLFOX  02/04/2014  Cycle 12 FOLFOX 02/18/2014, oxaliplatin dose reduced secondary tothrombocytopenia  CT abdomen/pelvis 01/30/2014 revealed splenomegaly and no evidence of recurrent colon cancer  CT chest 04/07/2014 with a stable right lower lobe nodule and no evidence for metastatic disease, no nodules seen on the CT 11/26/2014  Markedly elevated CEA 11/24/2014  CT 11/26/2014 revealed a right pelvic mass, splenomegaly, small volume ascites  Right salpingo-oophorectomy 12/28/2014 with the pathology confirming metastatic colon cancer  CTs 03/23/2016-no evidence of recurrent or metastatic disease.  CT 11/26/2016-enlargement of a fluid density structure the right pelvic sidewall, no other evidence of metastatic disease  CT aspiration right pelvic cyst 12/19/2016.Cytology-BENIGN REACTIVE/REPARATIVE CHANGES.  CTs 06/05/2017-no evidence of metastatic disease, mild cirrhotic changes with splenomegaly  CTs 12/11/2017-recurrent cystic right adnexal mass, similar to on the CT 11/26/2016, stable mild splenomegaly  CTs 04/23/2018-enlargement of cystic right adnexal mass with mural nodularity, no other evidence of metastatic disease  Cytoreductive surgery/HIPEC with mitomycin by Dr. Clovis Riley at [facility] 08/12/2018-R1 resection achieved. Cytoreduction included omentectomy, LAR, right salpingo-oophorectomy and left colonic gutter/pelvic stripping. Pathology on the rectum showed recurrent/metastatic adenocarcinoma, tumor 2.0 cm, predominantly involving the subserosa and muscularis propria of the colon, proximal and distal margins of resection were negative, vascular invasion present, metastatic carcinoma present in 1 out of 5 lymph nodes; omentum resection with no malignancy seen, no metastatic carcinoma identified in 1 lymph node examined; left gutter stripping positive metastatic adenocarcinoma; right ovary resection positive metastatic adenocarcinoma.  CT 12/30/2018-findings consistent with enterocutaneous  fistula, ileus; multiple rounded hypodensities in the liver.  CEA 68 02/03/2019  Biopsy liver lesion 02/11/2019-metastatic adenocarcinoma consistent with primary colonic adenocarcinoma 2. Mild elevation of the CEA beginning January 2016, normal on 05/19/2014   3. History of iron deficiency anemia  4. seizure disorder  5. history of depression  6. 4 mm right lower lobe nodule on a staging chest CT 09/08/2013 , stable on a CT 04/07/2014 7. Hospitalization 09/24/2013 through 09/26/2013 with fever and abdominal pain.  8. 09/24/2013 urine culture positive for coag negative staph.  9. 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 Splenomegaly noted on a CT scan 01/30/2014,persistent on repeat CTs 12. Colonoscopy 11/17/2018-flexible sigmoidoscopy per rectum with changes of mild diversion colitis. Scope advanced for approximately 25 cm. Most proximal portion had necrotic appearing debris. Colostomy bag insufflated suggesting some type of communication between the pouch and the proximal colon. Introduction of scope into the ostomy found to available directions. 1 toward the distal pouch with similar appearing necrotic debris encountered. The other was about a 30 cm segment of normal-appearing colonic mucosa to the level of the previous right hemicolectomy ileocolonic anastomosis.   Disposition: Ms. Estabrooks appears stable.  We reviewed the liver biopsy result that showed metastatic colon cancer.  She understands no therapy will be curative.  Dr. Benay Spice recommends beginning treatment with FOLFIRI/Panitumumab.  We reviewed potential toxicities associated with chemotherapy including bone marrow toxicity, nausea, hair loss, allergic reaction.  We discussed the potential for diarrhea, possibly severe, with Irinotecan.  We reviewed potential toxicities associated with 5-fluorouracil including mouth sores, diarrhea,  hand-foot syndrome, skin rash, increased sensitivity to sun.  We reviewed potential toxicities associated with Panitumumab including diarrhea, rash, paronychia.  She agrees to proceed.  She understands the rationale for doxycycline and will begin the day prior to cycle 1.  Prescriptions were sent to her pharmacy for doxycycline, Compazine, EMLA cream.  We are referring her for Port-A-Cath placement.  She will also have baseline restaging CT scans prior to beginning treatment.  She will return for lab, follow-up, cycle 1 FOLFIRI/Panitumumab on 03/05/2019.  She will contact the office in the interim with any problems.  Patient seen with Dr. Benay Spice.    Ned Card ANP/GNP-BC   02/23/2019  10:37 AM This was a shared visit with Ned Card.  We discussed the biopsy result and treatment options with Ms. Owens Shark.  We discussed repeat treatment with FOLFOX, FOLFIRI, and FOLFIRI/Panitumumab.  She understands the goal of treatment is palliative.  I recommend FOLFIRI/Panitumumab.  She agrees to proceed.  We reviewed potential toxicities associated with this regimen including the chance of alopecia, diarrhea, and rash.  Julieanne Manson, MD

## 2019-02-24 ENCOUNTER — Telehealth: Payer: Self-pay | Admitting: Nurse Practitioner

## 2019-02-24 ENCOUNTER — Encounter: Payer: Self-pay | Admitting: Nurse Practitioner

## 2019-02-24 ENCOUNTER — Telehealth: Payer: Self-pay | Admitting: Oncology

## 2019-02-24 NOTE — Telephone Encounter (Signed)
Scheduled appt per 1/19 sch message pt aware of appt date and time   

## 2019-02-24 NOTE — Telephone Encounter (Signed)
Returned patient's phone call regarding cancelling 01/21 appointment, per patient's request appointment has been cancelled.

## 2019-02-26 ENCOUNTER — Other Ambulatory Visit: Payer: BC Managed Care – PPO

## 2019-02-27 ENCOUNTER — Encounter (HOSPITAL_COMMUNITY): Payer: Self-pay

## 2019-02-27 ENCOUNTER — Ambulatory Visit (HOSPITAL_COMMUNITY)
Admission: RE | Admit: 2019-02-27 | Discharge: 2019-02-27 | Disposition: A | Discharge status: Home / Self Care | Payer: BC Managed Care – PPO | Source: Ambulatory Visit | Attending: Nurse Practitioner | Admitting: Nurse Practitioner

## 2019-02-27 ENCOUNTER — Other Ambulatory Visit: Payer: Self-pay

## 2019-02-27 DIAGNOSIS — C787 Secondary malignant neoplasm of liver and intrahepatic bile duct: Secondary | ICD-10-CM

## 2019-02-27 DIAGNOSIS — C182 Malignant neoplasm of ascending colon: Secondary | ICD-10-CM

## 2019-02-27 DIAGNOSIS — C189 Malignant neoplasm of colon, unspecified: Secondary | ICD-10-CM | POA: Diagnosis not present

## 2019-02-27 MED ORDER — IOHEXOL 300 MG/ML  SOLN
100.0000 mL | Freq: Once | INTRAMUSCULAR | Status: AC | PRN
  Administered 2019-02-27: 100 mL via INTRAVENOUS

## 2019-02-27 MED ORDER — SODIUM CHLORIDE (PF) 0.9 % IJ SOLN
INTRAMUSCULAR | Status: AC
  Filled 2019-02-27: qty 50

## 2019-02-28 ENCOUNTER — Other Ambulatory Visit: Payer: Self-pay | Admitting: Oncology

## 2019-03-04 ENCOUNTER — Other Ambulatory Visit: Payer: Self-pay | Admitting: Radiology

## 2019-03-05 ENCOUNTER — Ambulatory Visit (HOSPITAL_COMMUNITY)
Admission: RE | Admit: 2019-03-05 | Discharge: 2019-03-05 | Disposition: A | Discharge status: Home / Self Care | Payer: BC Managed Care – PPO | Source: Ambulatory Visit | Attending: Nurse Practitioner | Admitting: Nurse Practitioner

## 2019-03-05 ENCOUNTER — Other Ambulatory Visit: Payer: Self-pay | Admitting: Nurse Practitioner

## 2019-03-05 ENCOUNTER — Encounter (HOSPITAL_COMMUNITY): Payer: Self-pay

## 2019-03-05 ENCOUNTER — Other Ambulatory Visit: Payer: Self-pay

## 2019-03-05 DIAGNOSIS — K219 Gastro-esophageal reflux disease without esophagitis: Secondary | ICD-10-CM | POA: Insufficient documentation

## 2019-03-05 DIAGNOSIS — F329 Major depressive disorder, single episode, unspecified: Secondary | ICD-10-CM | POA: Diagnosis not present

## 2019-03-05 DIAGNOSIS — C787 Secondary malignant neoplasm of liver and intrahepatic bile duct: Secondary | ICD-10-CM | POA: Diagnosis not present

## 2019-03-05 DIAGNOSIS — Z5111 Encounter for antineoplastic chemotherapy: Secondary | ICD-10-CM | POA: Diagnosis not present

## 2019-03-05 DIAGNOSIS — C182 Malignant neoplasm of ascending colon: Secondary | ICD-10-CM | POA: Insufficient documentation

## 2019-03-05 DIAGNOSIS — R569 Unspecified convulsions: Secondary | ICD-10-CM | POA: Insufficient documentation

## 2019-03-05 DIAGNOSIS — F419 Anxiety disorder, unspecified: Secondary | ICD-10-CM | POA: Insufficient documentation

## 2019-03-05 DIAGNOSIS — F988 Other specified behavioral and emotional disorders with onset usually occurring in childhood and adolescence: Secondary | ICD-10-CM | POA: Insufficient documentation

## 2019-03-05 DIAGNOSIS — Z79899 Other long term (current) drug therapy: Secondary | ICD-10-CM | POA: Insufficient documentation

## 2019-03-05 DIAGNOSIS — C189 Malignant neoplasm of colon, unspecified: Secondary | ICD-10-CM | POA: Diagnosis not present

## 2019-03-05 DIAGNOSIS — D509 Iron deficiency anemia, unspecified: Secondary | ICD-10-CM | POA: Diagnosis not present

## 2019-03-05 HISTORY — PX: IR IMAGING GUIDED PORT INSERTION: IMG5740

## 2019-03-05 LAB — CBC WITH DIFFERENTIAL/PLATELET
Abs Immature Granulocytes: 0.02 10*3/uL (ref 0.00–0.07)
Basophils Absolute: 0 10*3/uL (ref 0.0–0.1)
Basophils Relative: 0 %
Eosinophils Absolute: 0.1 10*3/uL (ref 0.0–0.5)
Eosinophils Relative: 2 %
HCT: 38.1 % (ref 36.0–46.0)
Hemoglobin: 11.6 g/dL — ABNORMAL LOW (ref 12.0–15.0)
Immature Granulocytes: 1 %
Lymphocytes Relative: 33 %
Lymphs Abs: 1.4 10*3/uL (ref 0.7–4.0)
MCH: 24 pg — ABNORMAL LOW (ref 26.0–34.0)
MCHC: 30.4 g/dL (ref 30.0–36.0)
MCV: 78.9 fL — ABNORMAL LOW (ref 80.0–100.0)
Monocytes Absolute: 0.3 10*3/uL (ref 0.1–1.0)
Monocytes Relative: 7 %
Neutro Abs: 2.5 10*3/uL (ref 1.7–7.7)
Neutrophils Relative %: 57 %
Platelets: 170 10*3/uL (ref 150–400)
RBC: 4.83 MIL/uL (ref 3.87–5.11)
RDW: 14.6 % (ref 11.5–15.5)
WBC: 4.3 10*3/uL (ref 4.0–10.5)
nRBC: 0 % (ref 0.0–0.2)

## 2019-03-05 MED ORDER — LIDOCAINE-EPINEPHRINE (PF) 1 %-1:200000 IJ SOLN
INTRAMUSCULAR | Status: AC | PRN
  Administered 2019-03-05: 20 mL

## 2019-03-05 MED ORDER — HEPARIN SOD (PORK) LOCK FLUSH 100 UNIT/ML IV SOLN
INTRAVENOUS | Status: AC
  Filled 2019-03-05: qty 5

## 2019-03-05 MED ORDER — FENTANYL CITRATE (PF) 100 MCG/2ML IJ SOLN
INTRAMUSCULAR | Status: AC | PRN
  Administered 2019-03-05 (×4): 50 ug via INTRAVENOUS

## 2019-03-05 MED ORDER — HEPARIN SOD (PORK) LOCK FLUSH 100 UNIT/ML IV SOLN
INTRAVENOUS | Status: AC | PRN
  Administered 2019-03-05: 500 [IU] via INTRAVENOUS

## 2019-03-05 MED ORDER — LIDOCAINE-EPINEPHRINE 1 %-1:100000 IJ SOLN
INTRAMUSCULAR | Status: AC
  Filled 2019-03-05: qty 1

## 2019-03-05 MED ORDER — CLINDAMYCIN PHOSPHATE 900 MG/50ML IV SOLN: 900.0000 mg | Freq: Once | INTRAVENOUS | Status: AC

## 2019-03-05 MED ORDER — MIDAZOLAM HCL 2 MG/2ML IJ SOLN
INTRAMUSCULAR | Status: AC
  Filled 2019-03-05: qty 4

## 2019-03-05 MED ORDER — FENTANYL CITRATE (PF) 100 MCG/2ML IJ SOLN
INTRAMUSCULAR | Status: AC
  Filled 2019-03-05: qty 2

## 2019-03-05 MED ORDER — MIDAZOLAM HCL 2 MG/2ML IJ SOLN
INTRAMUSCULAR | Status: AC | PRN
  Administered 2019-03-05 (×3): 1 mg via INTRAVENOUS

## 2019-03-05 MED ORDER — CLINDAMYCIN PHOSPHATE 900 MG/50ML IV SOLN
INTRAVENOUS | Status: AC
  Administered 2019-03-05: 900 mg via INTRAVENOUS
  Filled 2019-03-05: qty 50

## 2019-03-05 MED ORDER — SODIUM CHLORIDE 0.9 % IV SOLN: INTRAVENOUS | Status: DC

## 2019-03-05 NOTE — Procedures (Signed)
Interventional Radiology Procedure Note  Procedure: Placement of a right IJ approach single lumen PowerPort.  Tip is positioned at the superior cavoatrial junction and catheter is ready for immediate use.  Complications: No immediate Recommendations:  - Ok to shower tomorrow - Do not submerge for 7 days - Routine line care   Signed,  Eddye Broxterman K. Trashawn Oquendo, MD   

## 2019-03-05 NOTE — H&P (Signed)
Chief Complaint: Patient was seen in consultation today for colon cancer/Port-a-cath placement.  Referring Physician(s): Riki Sheer  Supervising Physician: Jacqulynn Cadet  Patient Status: [facility] - Out-pt  History of Present Illness: [name] is a 44 y.o. female with a past medical history of seizures, migraines, GERD, recurrent colon cancer, anemia, anxiety, and depression. She was unfortunately diagnosed with recurrent colon cancer in 02/2019. Her cancer is managed by Dr. Benay Spice and Ned Card, NP. She has tentative plans to begin systemic chemotherapy as management.  IR requested by Ned Card, NP for possible image-guided Port-a-cath placement. Patient awake and alert laying in bed with no complaints at this time. Denies fever, chills, chest pain, dyspnea, abdominal pain, or headache.   Past Medical History:  Diagnosis Date  . ADD (attention deficit disorder)   . Anemia, iron deficiency   . Anxiety   . Colon cancer ([place]) 08/2013  . Depression   . Difficulty swallowing pills   . GERD (gastroesophageal reflux disease)   . Headache    migraines "long time ago"  . History of blood transfusion 08/16/2013  . History of migraine    no problems in "a long time"  . 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 Restless leg syndrome   . Seizures ([place])    last seizure 08/2012    Past Surgical History:  Procedure Laterality Date  . ABDOMINAL HYSTERECTOMY  08/24/2004   partial  . APPENDECTOMY  08/24/2004  . COLON SURGERY  08/11/2013   removed a foot of colon  . COLONOSCOPY  2019  . colonoscopy 10-18-14    . COLONOSCOPY WITH PROPOFOL  07/07/2013  . CYSTOSCOPY  11/01/2003  . ENDOMETRIAL FULGURATION  11/01/2003  . ESOPHAGOGASTRODUODENOSCOPY  07/07/2013  . LAPAROSCOPIC LYSIS OF ADHESIONS  11/01/2003  . LAPAROSCOPIC PARTIAL COLECTOMY Right 08/11/2013   Procedure: LAPAROSCOPIC RIGHT HEMICOLECTOMY;  Surgeon: Stark Klein, MD;  Location: [place];  Service: General;   Laterality: Right;  . LAPAROTOMY N/A 12/28/2014   Procedure: EXPLORATORY LAPAROTOMY;  Surgeon: Everitt Amber, MD;  Location: [facility];  Service: Gynecology;  Laterality: N/A;  . LEFT OOPHORECTOMY  08/24/2004  . PANNICULECTOMY  08/24/2004  . PORT-A-CATH REMOVAL Left 06/08/2014   Procedure: REMOVAL PORT-A-CATH;  Surgeon: Stark Klein, MD;  Location: [place];  Service: General;  Laterality: Left;  . PORTACATH PLACEMENT Left 09/09/2013   Procedure: INSERTION PORT-A-CATH;  Surgeon: Stark Klein, MD;  Location: [place];  Service: General;  Laterality: Left;  . SALPINGOOPHORECTOMY Right 12/28/2014   Procedure: RIGHT SALPINGO OOPHORECTOMY;  Surgeon: Everitt Amber, MD;  Location: [facility];  Service: Gynecology;  Laterality: Right;  . TUBAL LIGATION  11/07/2000  . UNILATERAL SALPINGECTOMY Left 08/24/2004  . WISDOM TOOTH EXTRACTION Bilateral 1994    Allergies: Adhesive [tape], Eggs or egg-derived products, Metoclopramide hcl, Oxycodone-acetaminophen, Sulfa antibiotics, and Penicillins  Medications: Prior to Admission medications   Medication Sig Start Date End Date Taking? Authorizing Provider  acetaminophen (TYLENOL) 500 MG tablet Take 1,000 mg by mouth every 6 (six) hours as needed.    [provider]  alum & mag hydroxide-simeth (MAALOX/MYLANTA) 200-200-20 MG/5ML suspension Take 30 mLs by mouth every 6 (six) hours as needed for indigestion or heartburn.    [provider]  diphenhydrAMINE (BENADRYL) 25 MG tablet Take 25 mg by mouth every 6 (six) hours as needed for itching or allergies (Patient takes with oxycodone to prevent reaction).    [provider]  doxycycline (VIBRA-TABS) 100 MG tablet Take 1  tablet (100 mg total) by mouth 2 (two) times daily. 02/23/19   Owens Shark, NP  Ibuprofen-diphenhydrAMINE HCl 200-25 MG CAPS Take 1 tablet by mouth at bedtime. 09/29/18   [provider]  lidocaine-prilocaine (EMLA) cream Apply to port site 1-2 hours prior to use  02/23/19   Owens Shark, NP  LORazepam (ATIVAN) 0.5 MG tablet Take 1 tablet (0.5 mg total) by mouth 2 (two) times daily as needed for anxiety. 01/12/19   Owens Shark, NP  polyethylene glycol (MIRALAX / GLYCOLAX) 17 g packet Take 17 g by mouth daily. 12/26/18   [provider]  prochlorperazine (COMPAZINE) 10 MG tablet Take 1 tablet (10 mg total) by mouth every 6 (six) hours as needed for nausea or vomiting. 02/23/19   Owens Shark, NP     Family History  Problem Relation Age of Onset  . Colon polyps Mother        3+ colon polyps at each colonoscopy - "several"  . Colitis Mother   . Heart attack Mother   . Diabetes Mother   . Heart disease Mother   . Diabetes Father   . Heart attack Father   . Stroke Father   . Congestive Heart Failure Father   . Heart disease Father   . Cirrhosis Maternal Aunt   . Lung cancer Maternal Uncle        d. 7s; smoker and worked around asbestos  . Other Paternal Grandfather        "bone cancer"  . Colon cancer Maternal Uncle        dx early 31s  . Breast cancer Maternal Aunt        dx 50s-60s; s/p lumpectomy  . Dementia Maternal Aunt   . Other Cousin        maternal 1st cousin w/ "lung issues"  . Dementia Paternal Aunt   . Dementia Paternal Uncle   . Prostate cancer Paternal Uncle 70  . Esophageal cancer Neg Hx   . Rectal cancer Neg Hx   . Stomach cancer Neg Hx     Social History   Socioeconomic History  . Marital status: Married    Spouse name: Not on file  . Number of children: 2  . Years of education: Not on file  . Highest education level: Not on file  Occupational History  . Occupation: Unemployed- full time student    Comment: Completed 12th grade  Tobacco Use  . Smoking status: Never Smoker  . Smokeless tobacco: Never Used  Substance and Sexual Activity  . Alcohol use: No  . Drug use: No  . Sexual activity: Yes    Birth control/protection: Surgical  Other Topics Concern  . Not on file  Social History Narrative   . Not on file   Social Determinants of Health   Financial Resource Strain:   . Difficulty of Paying Living Expenses: Not on file  Food Insecurity:   . Worried About Charity fundraiser in the Last Year: Not on file  . Ran Out of Food in the Last Year: Not on file  Transportation Needs:   . Lack of Transportation (Medical): Not on file  . Lack of Transportation (Non-Medical): Not on file  Physical Activity:   . Days of Exercise per Week: Not on file  . Minutes of Exercise per Session: Not on file  Stress:   . Feeling of Stress : Not on file  Social Connections:   . Frequency of Communication with  Friends and Family: Not on file  . Frequency of Social Gatherings with Friends and Family: Not on file  . Attends Religious Services: Not on file  . Active Member of Clubs or Organizations: Not on file  . Attends Archivist Meetings: Not on file  . Marital Status: Not on file     Review of Systems: A 12 point ROS discussed and pertinent positives are indicated in the HPI above.  All other systems are negative.  Review of Systems  Constitutional: Negative for chills and fever.  Respiratory: Negative for shortness of breath and wheezing.   Cardiovascular: Negative for chest pain and palpitations.  Gastrointestinal: Negative for abdominal pain.  Neurological: Negative for headaches.  Psychiatric/Behavioral: Negative for behavioral problems and confusion.    Vital Signs: BP (!) 172/93 (BP Location: Right Arm)   Pulse (!) 111   Temp 97.7 F (36.5 C) (Oral)   Resp 18   SpO2 100%   Physical Exam Vitals and nursing note reviewed.  Constitutional:      General: She is not in acute distress.    Appearance: Normal appearance.  Cardiovascular:     Rate and Rhythm: Normal rate and regular rhythm.     Heart sounds: Normal heart sounds. No murmur.  Pulmonary:     Effort: Pulmonary effort is normal. No respiratory distress.     Breath sounds: Normal breath sounds. No  wheezing.  Skin:    General: Skin is warm and dry.  Neurological:     Mental Status: She is alert and oriented to person, place, and time.  Psychiatric:        Mood and Affect: Mood normal.        Behavior: Behavior normal.      MD Evaluation Airway: WNL Heart: WNL Abdomen: WNL Chest/ Lungs: WNL ASA  Classification: 3 Mallampati/Airway Score: Two   Imaging: CT Chest W Contrast  Result Date: 02/27/2019 CLINICAL DATA:  Metastatic colon cancer staging EXAM: CT CHEST, ABDOMEN, AND PELVIS WITH CONTRAST TECHNIQUE: Multidetector CT imaging of the chest, abdomen and pelvis was performed following the standard protocol during bolus administration of intravenous contrast. CONTRAST:  116mL OMNIPAQUE IOHEXOL 300 MG/ML SOLN, additional oral enteric contrast COMPARISON:  CT abdomen pelvis, 12/29/2018 FINDINGS: CT CHEST FINDINGS Cardiovascular: No significant vascular findings. Normal heart size. No pericardial effusion. Mediastinum/Nodes: No enlarged mediastinal, hilar, or axillary lymph nodes. Thymic remnant in the anterior mediastinum. Thyroid gland, trachea, and esophagus demonstrate no significant findings. Lungs/Pleura: Lungs are clear. No pleural effusion or pneumothorax. Musculoskeletal: No chest wall mass or suspicious bone lesions identified. CT ABDOMEN PELVIS FINDINGS Hepatobiliary: Multiple low-attenuation lesions of the liver are increased in size, the largest in the anterior left lobe measuring 3.4 x 3.1 cm, previously 2.7 x 2.3 cm when measured similarly (series 2, image 44). There is a new lesion in the lateral right lobe of the liver measuring 0.8 cm (series 2, image 45). No gallstones, gallbladder wall thickening, or biliary dilatation. Pancreas: Unremarkable. No pancreatic ductal dilatation or surrounding inflammatory changes. Spleen: Redemonstrated splenomegaly, maximum coronal span 17.5 cm. Adrenals/Urinary Tract: Adrenal glands are unremarkable. Redemonstrated moderate left  hydronephrosis and proximal hydroureter, similar to prior examination, without discrete lesion appreciated at the transition point of the mid ureter (series 2, image 84). Bladder is unremarkable. Stomach/Bowel: Stomach is within normal limits. Status post right colon resection and reanastomosis. Status post low anterior resection and reanastomosis. Vascular/Lymphatic: No significant vascular findings are present. No enlarged abdominal or pelvic lymph nodes. Reproductive:  Status post hysterectomy and salpingooophorectomy. Other: Interval resolution of a previously seen air and fluid collection in the anterior midline abdomen at the site of colostomy takedown, with residual postoperative soft tissue thickening in this vicinity (series 2, image 78). Diminished adjacent fluid collection about the splenic tip (series 2, image 75). Unchanged post treatment appearance of the low pelvis. Musculoskeletal: No acute or significant osseous findings. IMPRESSION: 1. Multiple low-attenuation lesions of the liver are increased in size, the largest in the anterior left lobe measuring 3.4 x 3.1 cm, previously 2.7 x 2.3 cm when measured similarly (series 2, image 44). There is a new lesion in the lateral right lobe of the liver measuring 0.8 cm (series 2, image 45). Findings are consistent with worsened hepatic metastatic disease. 2. No evidence of metastatic disease in the chest. 3. Interval resolution of a previously seen air and fluid collection in the anterior midline abdomen at the site of colostomy takedown, with residual postoperative soft tissue thickening in this vicinity (series 2, image 78).Diminished adjacent fluid collection about the splenic tip (series 2, image 75). 4. Redemonstrated moderate left hydronephrosis and proximal hydroureter, similar to prior examination, without discrete lesion or obstructing etiology appreciated at the transition point of the mid ureter (series 2, image 84). 5. Redemonstrated postoperative  findings of partial right colectomy, low anterior resection, and hysterectomy and salpingooophorectomy. Unchanged post treatment appearance of the low pelvis. Electronically Signed   By: Eddie Candle M.D.   On: 02/27/2019 09:32   CT Abdomen Pelvis W Contrast  Result Date: 02/27/2019 CLINICAL DATA:  Metastatic colon cancer staging EXAM: CT CHEST, ABDOMEN, AND PELVIS WITH CONTRAST TECHNIQUE: Multidetector CT imaging of the chest, abdomen and pelvis was performed following the standard protocol during bolus administration of intravenous contrast. CONTRAST:  158mL OMNIPAQUE IOHEXOL 300 MG/ML SOLN, additional oral enteric contrast COMPARISON:  CT abdomen pelvis, 12/29/2018 FINDINGS: CT CHEST FINDINGS Cardiovascular: No significant vascular findings. Normal heart size. No pericardial effusion. Mediastinum/Nodes: No enlarged mediastinal, hilar, or axillary lymph nodes. Thymic remnant in the anterior mediastinum. Thyroid gland, trachea, and esophagus demonstrate no significant findings. Lungs/Pleura: Lungs are clear. No pleural effusion or pneumothorax. Musculoskeletal: No chest wall mass or suspicious bone lesions identified. CT ABDOMEN PELVIS FINDINGS Hepatobiliary: Multiple low-attenuation lesions of the liver are increased in size, the largest in the anterior left lobe measuring 3.4 x 3.1 cm, previously 2.7 x 2.3 cm when measured similarly (series 2, image 44). There is a new lesion in the lateral right lobe of the liver measuring 0.8 cm (series 2, image 45). No gallstones, gallbladder wall thickening, or biliary dilatation. Pancreas: Unremarkable. No pancreatic ductal dilatation or surrounding inflammatory changes. Spleen: Redemonstrated splenomegaly, maximum coronal span 17.5 cm. Adrenals/Urinary Tract: Adrenal glands are unremarkable. Redemonstrated moderate left hydronephrosis and proximal hydroureter, similar to prior examination, without discrete lesion appreciated at the transition point of the mid ureter  (series 2, image 84). Bladder is unremarkable. Stomach/Bowel: Stomach is within normal limits. Status post right colon resection and reanastomosis. Status post low anterior resection and reanastomosis. Vascular/Lymphatic: No significant vascular findings are present. No enlarged abdominal or pelvic lymph nodes. Reproductive: Status post hysterectomy and salpingooophorectomy. Other: Interval resolution of a previously seen air and fluid collection in the anterior midline abdomen at the site of colostomy takedown, with residual postoperative soft tissue thickening in this vicinity (series 2, image 78). Diminished adjacent fluid collection about the splenic tip (series 2, image 75). Unchanged post treatment appearance of the low pelvis. Musculoskeletal: No acute or  significant osseous findings. IMPRESSION: 1. Multiple low-attenuation lesions of the liver are increased in size, the largest in the anterior left lobe measuring 3.4 x 3.1 cm, previously 2.7 x 2.3 cm when measured similarly (series 2, image 44). There is a new lesion in the lateral right lobe of the liver measuring 0.8 cm (series 2, image 45). Findings are consistent with worsened hepatic metastatic disease. 2. No evidence of metastatic disease in the chest. 3. Interval resolution of a previously seen air and fluid collection in the anterior midline abdomen at the site of colostomy takedown, with residual postoperative soft tissue thickening in this vicinity (series 2, image 78).Diminished adjacent fluid collection about the splenic tip (series 2, image 75). 4. Redemonstrated moderate left hydronephrosis and proximal hydroureter, similar to prior examination, without discrete lesion or obstructing etiology appreciated at the transition point of the mid ureter (series 2, image 84). 5. Redemonstrated postoperative findings of partial right colectomy, low anterior resection, and hysterectomy and salpingooophorectomy. Unchanged post treatment appearance of the  low pelvis. Electronically Signed   By: Eddie Candle M.D.   On: 02/27/2019 09:32   US BIOPSY (LIVER)  Result Date: 02/11/2019 INDICATION: 44 year old female with a history of metastatic colon cancer referred for biopsy EXAM: ULTRASOUND-GUIDED BIOPSY OF LIVER MASS MEDICATIONS: None. ANESTHESIA/SEDATION: Moderate (conscious) sedation was employed during this procedure. A total of Versed 2.0 mg and Fentanyl 100 mcg was administered intravenously. Moderate Sedation Time: 12 minutes. The patient's level of consciousness and vital signs were monitored continuously by radiology nursing throughout the procedure under my direct supervision. FLUOROSCOPY TIME:  None COMPLICATIONS: None PROCEDURE: Informed written consent was obtained from the patient after a thorough discussion of the procedural risks, benefits and alternatives. All questions were addressed. Maximal Sterile Barrier Technique was utilized including caps, mask, sterile gowns, sterile gloves, sterile drape, hand hygiene and skin antiseptic. A timeout was performed prior to the initiation of the procedure. Patient positioned supine position on the ultrasound table. Ultrasound images were acquired with images stored sent to PACs. The patient is prepped and draped in the usual sterile fashion. 1% lidocaine was used for local anesthesia. Using ultrasound guidance, guide needle was advanced into the targeted the left liver lobe lesion. Once we confirmed position of the needle at the proximal margin of the lesion, measurements were made to ensure a safe trajectory. Multiple 18 gauge core biopsy were then acquired. Gel-Foam pledgets were infused with a small amount of saline. Needle was removed and a final image was stored. Patient tolerated the procedure well and remained hemodynamically stable throughout. No complications were encountered and no significant blood loss. IMPRESSION: Status post ultrasound-guided biopsy of left liver mass. Tissue specimen sent to  pathology for complete histopathologic analysis. Signed, Dulcy Fanny. Dellia Nims, RPVI [facility] Electronically Signed   By: Corrie Mckusick D.O.   On: 02/11/2019 13:48    Labs:  CBC: Recent Labs    12/30/18 1852 01/01/19 0458 02/11/19 1130 03/05/19 1155  WBC 3.3* 3.2* 3.0* 4.3  HGB 9.4* 8.3* 10.4* 11.6*  HCT 30.8* 27.1* 34.1* 38.1  PLT 169 141* 145* 170    COAGS: Recent Labs    12/31/18 1327 02/11/19 1130  INR 1.3* 1.0    BMP: Recent Labs    12/29/18 2356 12/30/18 1852 01/01/19 0458 02/11/19 1130  NA 133* 134* 137 143  K 3.9 3.6 3.2* 3.8  CL 94* 101 100 108  CO2 25 26 21* 25  GLUCOSE 181* 99  65* 98  BUN 22* 20 17 14   CALCIUM 9.9 8.5* 8.9 9.7  CREATININE 0.96 0.76 0.73 0.72  GFRNONAA >60 >60 >60 >60  GFRAA >60 >60 >60 >60    LIVER FUNCTION TESTS: Recent Labs    09/08/18 1436 12/29/18 2356 12/30/18 1852 02/11/19 1130  BILITOT 0.8 1.3* 0.9 0.5  AST 12* 14* 11* 22  ALT 11 12 10 12   ALKPHOS 65 71 55 66  PROT 6.9 8.3* 6.5 7.3  ALBUMIN 3.0* 3.9 3.1* 4.0     Assessment and Plan:  Colon cancer with tentative plans to begin systemic chemotherapy. Plan for image-guided Port-a-cath placement today in IR. Patient is NPO. Afebrile. She does not take blood thinners.  Risks and benefits of image-guided Port-a-catheter placement were discussed with the patient including, but not limited to bleeding, infection, pneumothorax, or fibrin sheath development and need for additional procedures. All of the patient's questions were answered, patient is agreeable to proceed. Consent signed and in chart.   Thank you for this interesting consult.  I greatly enjoyed meeting DEVIDA FOTI and look forward to participating in their care.  A copy of this report was sent to the requesting provider on this date.  Electronically Signed: Earley Abide, PA-C 03/05/2019, 12:36 PM   I spent a total of 25 Minutes in face  to face in clinical consultation, greater than 50% of which was counseling/coordinating care for colon cancer/Port-a-cath placement.

## 2019-03-05 NOTE — Discharge Instructions (Signed)
Please call Interventional Radiologist clinic [phone] with any questions about your port.  DO NOT use EMLA cream for 2 weeks after port placement as this cream will remove surgical glue on your incision.  You may shower and remove your dressing tomorrow.    Implanted Port Insertion, Care After This sheet gives you information about how to care for yourself after your procedure. Your health care provider may also give you more specific instructions. If you have problems or questions, contact your health care provider. What can I expect after the procedure? After the procedure, it is common to have:  Discomfort at the port insertion site.  Bruising on the skin over the port. This should improve over 3-4 days. Follow these instructions at home: [facility] care  After your port is placed, you will get a manufacturer's information card. The card has information about your port. Keep this card with you at all times.  Take care of the port as told by your health care provider. Ask your health care provider if you or a family member can get training for taking care of the port at home. A home health care nurse may also take care of the port.  Make sure to remember what type of port you have. Incision care      Follow instructions from your health care provider about how to take care of your port insertion site. Make sure you: ? Wash your hands with soap and water before and after you change your bandage (dressing). If soap and water are not available, use hand sanitizer. ? Change your dressing as told by your health care provider. ? Leave stitches (sutures), skin glue, or adhesive strips in place. These skin closures may need to stay in place for 2 weeks or longer. If adhesive strip edges start to loosen and curl up, you may trim the loose edges. Do not remove adhesive strips completely unless your health care provider tells you to do that.  Check your port insertion site every day for signs of  infection. Check for: ? Redness, swelling, or pain. ? Fluid or blood. ? Warmth. ? Pus or a bad smell. Activity  Return to your normal activities as told by your health care provider. Ask your health care provider what activities are safe for you.  Do not lift anything that is heavier than 10 lb (4.5 kg), or the limit that you are told, until your health care provider says that it is safe. General instructions  Take over-the-counter and prescription medicines only as told by your health care provider.  Do not take baths, swim, or use a hot tub until your health care provider approves. Ask your health care provider if you may take showers. You may only be allowed to take sponge baths.  Do not drive for 24 hours if you were given a sedative during your procedure.  Wear a medical alert bracelet in case of an emergency. This will tell any health care providers that you have a port.  Keep all follow-up visits as told by your health care provider. This is important. Contact a health care provider if:  You cannot flush your port with saline as directed, or you cannot draw blood from the port.  You have a fever or chills.  You have redness, swelling, or pain around your port insertion site.  You have fluid or blood coming from your port insertion site.  Your port insertion site feels warm to the touch.  You have pus or  a bad smell coming from the port insertion site. Get help right away if:  You have chest pain or shortness of breath.  You have bleeding from your port that you cannot control. Summary  Take care of the port as told by your health care provider. Keep the manufacturer's information card with you at all times.  Change your dressing as told by your health care provider.  Contact a health care provider if you have a fever or chills or if you have redness, swelling, or pain around your port insertion site.  Keep all follow-up visits as told by your health care  provider. This information is not intended to replace advice given to you by your health care provider. Make sure you discuss any questions you have with your health care provider. Document Revised: 08/20/2017 Document Reviewed: 08/20/2017 Elsevier Patient Education  [place].   Moderate Conscious Sedation, Adult, Care After These instructions provide you with information about caring for yourself after your procedure. Your health care provider may also give you more specific instructions. Your treatment has been planned according to current medical practices, but problems sometimes occur. Call your health care provider if you have any problems or questions after your procedure. What can I expect after the procedure? After your procedure, it is common:  To feel sleepy for several hours.  To feel clumsy and have poor balance for several hours.  To have poor judgment for several hours.  To vomit if you eat too soon. Follow these instructions at home: For at least 24 hours after the procedure:   Do not: ? Participate in activities where you could fall or become injured. ? Drive. ? Use heavy machinery. ? Drink alcohol. ? Take sleeping pills or medicines that cause drowsiness. ? Make important decisions or sign legal documents. ? Take care of children on your own.  Rest. Eating and drinking  Follow the diet recommended by your health care provider.  If you vomit: ? Drink water, juice, or soup when you can drink without vomiting. ? Make sure you have little or no nausea before eating solid foods. General instructions  Have a responsible adult stay with you until you are awake and alert.  Take over-the-counter and prescription medicines only as told by your health care provider.  If you smoke, do not smoke without supervision.  Keep all follow-up visits as told by your health care provider. This is important. Contact a health care provider if:  You keep feeling  nauseous or you keep vomiting.  You feel light-headed.  You develop a rash.  You have a fever. Get help right away if:  You have trouble breathing. This information is not intended to replace advice given to you by your health care provider. Make sure you discuss any questions you have with your health care provider. Document Revised: 01/04/2017 Document Reviewed: 05/14/2015 Elsevier Patient Education  2020 [employer].

## 2019-03-06 ENCOUNTER — Other Ambulatory Visit: Payer: BC Managed Care – PPO

## 2019-03-06 ENCOUNTER — Ambulatory Visit: Payer: BC Managed Care – PPO | Admitting: Nurse Practitioner

## 2019-03-06 ENCOUNTER — Encounter: Payer: Self-pay | Admitting: Nurse Practitioner

## 2019-03-06 MED ORDER — METHYLPREDNISOLONE 4 MG PO TBPK: ORAL_TABLET | ORAL | 0 refills | Status: DC

## 2019-03-06 NOTE — Telephone Encounter (Signed)
Notified patient that MD has approved a medrol dose pack to take for her rash. It has been sent to [facility].

## 2019-03-12 ENCOUNTER — Inpatient Hospital Stay: Payer: BC Managed Care – PPO | Attending: Oncology

## 2019-03-12 ENCOUNTER — Inpatient Hospital Stay: Payer: BC Managed Care – PPO

## 2019-03-12 ENCOUNTER — Other Ambulatory Visit: Payer: Self-pay

## 2019-03-12 ENCOUNTER — Inpatient Hospital Stay (HOSPITAL_BASED_OUTPATIENT_CLINIC_OR_DEPARTMENT_OTHER): Payer: BC Managed Care – PPO | Admitting: Nurse Practitioner

## 2019-03-12 ENCOUNTER — Telehealth: Payer: Self-pay | Admitting: *Deleted

## 2019-03-12 ENCOUNTER — Encounter: Payer: Self-pay | Admitting: Nurse Practitioner

## 2019-03-12 VITALS — BP 146/77

## 2019-03-12 VITALS — BP 162/91 | HR 86 | Temp 98.5°F | Resp 18 | Ht 66.0 in | Wt 150.1 lb

## 2019-03-12 DIAGNOSIS — C787 Secondary malignant neoplasm of liver and intrahepatic bile duct: Secondary | ICD-10-CM

## 2019-03-12 DIAGNOSIS — D709 Neutropenia, unspecified: Secondary | ICD-10-CM | POA: Diagnosis not present

## 2019-03-12 DIAGNOSIS — Z5111 Encounter for antineoplastic chemotherapy: Secondary | ICD-10-CM | POA: Diagnosis not present

## 2019-03-12 DIAGNOSIS — C182 Malignant neoplasm of ascending colon: Secondary | ICD-10-CM | POA: Insufficient documentation

## 2019-03-12 DIAGNOSIS — Z452 Encounter for adjustment and management of vascular access device: Secondary | ICD-10-CM | POA: Insufficient documentation

## 2019-03-12 DIAGNOSIS — C7961 Secondary malignant neoplasm of right ovary: Secondary | ICD-10-CM | POA: Diagnosis not present

## 2019-03-12 DIAGNOSIS — R21 Rash and other nonspecific skin eruption: Secondary | ICD-10-CM | POA: Insufficient documentation

## 2019-03-12 DIAGNOSIS — Z5112 Encounter for antineoplastic immunotherapy: Secondary | ICD-10-CM | POA: Insufficient documentation

## 2019-03-12 DIAGNOSIS — G40909 Epilepsy, unspecified, not intractable, without status epilepticus: Secondary | ICD-10-CM | POA: Diagnosis not present

## 2019-03-12 DIAGNOSIS — D6959 Other secondary thrombocytopenia: Secondary | ICD-10-CM | POA: Diagnosis not present

## 2019-03-12 LAB — CMP (CANCER CENTER ONLY)
ALT: 20 U/L (ref 0–44)
AST: 20 U/L (ref 15–41)
Albumin: 4 g/dL (ref 3.5–5.0)
Alkaline Phosphatase: 83 U/L (ref 38–126)
Anion gap: 9 (ref 5–15)
BUN: 22 mg/dL — ABNORMAL HIGH (ref 6–20)
CO2: 27 mmol/L (ref 22–32)
Calcium: 9.2 mg/dL (ref 8.9–10.3)
Chloride: 104 mmol/L (ref 98–111)
Creatinine: 1.05 mg/dL — ABNORMAL HIGH (ref 0.44–1.00)
GFR, Est AFR Am: >60 mL/min (ref >60)
GFR, Estimated: >60 mL/min (ref >60)
Glucose, Bld: 197 mg/dL — ABNORMAL HIGH (ref 70–99)
Potassium: 3.4 mmol/L — ABNORMAL LOW (ref 3.5–5.1)
Sodium: 140 mmol/L (ref 135–145)
Total Bilirubin: 0.6 mg/dL (ref 0.3–1.2)
Total Protein: 7.2 g/dL (ref 6.5–8.1)

## 2019-03-12 LAB — CBC WITH DIFFERENTIAL (CANCER CENTER ONLY)
Abs Immature Granulocytes: 0.08 10*3/uL — ABNORMAL HIGH (ref 0.00–0.07)
Basophils Absolute: 0 10*3/uL (ref 0.0–0.1)
Basophils Relative: 0 %
Eosinophils Absolute: 0.1 10*3/uL (ref 0.0–0.5)
Eosinophils Relative: 2 %
HCT: 36.5 % (ref 36.0–46.0)
Hemoglobin: 11.3 g/dL — ABNORMAL LOW (ref 12.0–15.0)
Immature Granulocytes: 2 %
Lymphocytes Relative: 28 %
Lymphs Abs: 1.5 10*3/uL (ref 0.7–4.0)
MCH: 23.8 pg — ABNORMAL LOW (ref 26.0–34.0)
MCHC: 31 g/dL (ref 30.0–36.0)
MCV: 77 fL — ABNORMAL LOW (ref 80.0–100.0)
Monocytes Absolute: 0.4 10*3/uL (ref 0.1–1.0)
Monocytes Relative: 7 %
Neutro Abs: 3.2 10*3/uL (ref 1.7–7.7)
Neutrophils Relative %: 61 %
Platelet Count: 169 10*3/uL (ref 150–400)
RBC: 4.74 MIL/uL (ref 3.87–5.11)
RDW: 14.8 % (ref 11.5–15.5)
WBC Count: 5.2 10*3/uL (ref 4.0–10.5)
nRBC: 0 % (ref 0.0–0.2)

## 2019-03-12 LAB — CEA (IN HOUSE-CHCC): CEA (CHCC-In House): 150.18 ng/mL — ABNORMAL HIGH (ref 0.00–5.00)

## 2019-03-12 MED ORDER — FLUOROURACIL CHEMO INJECTION 2.5 GM/50ML
400.0000 mg/m2 | Freq: Once | INTRAVENOUS | Status: AC
  Administered 2019-03-12: 700 mg via INTRAVENOUS
  Filled 2019-03-12: qty 14

## 2019-03-12 MED ORDER — PALONOSETRON HCL INJECTION 0.25 MG/5ML
INTRAVENOUS | Status: AC
  Filled 2019-03-12: qty 5

## 2019-03-12 MED ORDER — ATROPINE SULFATE 1 MG/ML IJ SOLN
INTRAMUSCULAR | Status: AC
  Filled 2019-03-12: qty 1

## 2019-03-12 MED ORDER — HEPARIN SOD (PORK) LOCK FLUSH 100 UNIT/ML IV SOLN
500.0000 [IU] | Freq: Once | INTRAVENOUS | Status: DC | PRN
  Filled 2019-03-12: qty 5

## 2019-03-12 MED ORDER — ATROPINE SULFATE 1 MG/ML IJ SOLN
0.5000 mg | Freq: Once | INTRAMUSCULAR | Status: AC | PRN
  Administered 2019-03-12: 0.5 mg via INTRAVENOUS

## 2019-03-12 MED ORDER — SODIUM CHLORIDE 0.9 % IV SOLN
5.8000 mg/kg | Freq: Once | INTRAVENOUS | Status: AC
  Administered 2019-03-12: 13:00:00 400 mg via INTRAVENOUS
  Filled 2019-03-12: qty 20

## 2019-03-12 MED ORDER — DEXAMETHASONE SODIUM PHOSPHATE 10 MG/ML IJ SOLN
INTRAMUSCULAR | Status: AC
  Filled 2019-03-12: qty 1

## 2019-03-12 MED ORDER — SODIUM CHLORIDE 0.9 % IV SOLN
180.0000 mg/m2 | Freq: Once | INTRAVENOUS | Status: AC
  Administered 2019-03-12: 14:00:00 320 mg via INTRAVENOUS
  Filled 2019-03-12: qty 15

## 2019-03-12 MED ORDER — PALONOSETRON HCL INJECTION 0.25 MG/5ML
0.2500 mg | Freq: Once | INTRAVENOUS | Status: AC
  Administered 2019-03-12: 12:00:00 0.25 mg via INTRAVENOUS

## 2019-03-12 MED ORDER — SODIUM CHLORIDE 0.9 % IV SOLN
Freq: Once | INTRAVENOUS | Status: AC
  Filled 2019-03-12: qty 250

## 2019-03-12 MED ORDER — SODIUM CHLORIDE 0.9 % IV SOLN
2400.0000 mg/m2 | INTRAVENOUS | Status: DC
  Administered 2019-03-12: 16:00:00 4300 mg via INTRAVENOUS
  Filled 2019-03-12: qty 86

## 2019-03-12 MED ORDER — SODIUM CHLORIDE 0.9 % IV SOLN
400.0000 mg/m2 | Freq: Once | INTRAVENOUS | Status: AC
  Administered 2019-03-12: 14:00:00 716 mg via INTRAVENOUS
  Filled 2019-03-12: qty 35.8

## 2019-03-12 MED ORDER — SODIUM CHLORIDE 0.9% FLUSH
10.0000 mL | INTRAVENOUS | Status: DC | PRN
  Filled 2019-03-12: qty 10

## 2019-03-12 MED ORDER — DEXAMETHASONE SODIUM PHOSPHATE 10 MG/ML IJ SOLN
10.0000 mg | Freq: Once | INTRAMUSCULAR | Status: AC
  Administered 2019-03-12: 10 mg via INTRAVENOUS

## 2019-03-12 NOTE — Telephone Encounter (Signed)
Notified of message below. Verbalized understanding 

## 2019-03-12 NOTE — Progress Notes (Signed)
Pt. has a increasing runny nose, clear in color since start of Irinotecan infusion. Medicated with Atropine 0.5mg  IV per orders. Pt. States runny nosed improved after medication.  Blood return noted before, during, and after Fluorouracil IV push.

## 2019-03-12 NOTE — Telephone Encounter (Signed)
-----   Message from Owens Shark, NP sent at 03/12/2019 12:14 PM EST ----- Please let her know blood sugar is elevated on lab work today.  This is likely related to the Medrol Dosepak.  Please instruct her on a no concentrated sweet diet.

## 2019-03-12 NOTE — Progress Notes (Signed)
[facility] OFFICE PROGRESS NOTE   Diagnosis: Colon cancer  INTERVAL HISTORY:   [name] returns as scheduled.  She developed a rash following Port-A-Cath placement.  She wonders if the rash was related to the antibiotic she received prior to port placement.  The rash was generalized, itchy and she developed lip ulcers.  She took a Medrol Dosepak.  The rash has nearly resolved.  She denies nausea/vomiting.  No diarrhea.  She denies pain.  She describes her appetite as "okay".  Objective:  Vital signs in last 24 hours:  Blood pressure (!) 162/91, pulse 86, temperature 98.5 F (36.9 C), temperature source Temporal, resp. rate 18, height 5' 6"  (1.676 m), weight 150 lb 1.6 oz (68.1 kg), SpO2 100 %.    HEENT: Healing ulcers lip.  No ulcers within the oral cavity.  No thrush. GI: Abdomen soft and nontender.  No hepatomegaly. Vascular: No leg edema. Neuro: Alert and oriented. Skin: Dorsum of the hands appears dry. Port-A-Cath without erythema.   Lab Results:  Lab Results  Component Value Date   WBC 5.2 03/12/2019   HGB 11.3 (L) 03/12/2019   HCT 36.5 03/12/2019   MCV 77.0 (L) 03/12/2019   PLT 169 03/12/2019   NEUTROABS 3.2 03/12/2019    Imaging:  No results found.  Medications: I have reviewed the patient's current medications.  Assessment/Plan: 1. 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  The tumor returned microsatellite stable with no loss of mismatch repair protein expression   APC mutated. No BRAF, KRAS, or NRAS mutation On Foundation 1 testing   Cycle 1 adjuvant FOLFOX 09/08/2013   Cycle 2 adjuvant FOLFOX 09/24/2013   Cycle 3 adjuvant FOLFOX 10/08/2013.   Cycle 4 adjuvant FOLFOX 10/22/2013.   Cycle 5 adjuvant FOLFOX 11/05/2013. Oxaliplatin held due to thrombocytopenia.  Cycle 6 FOLFOX 11/19/2013.  Cycle 7 FOLFOX 12/03/2013.  Oxaliplatin held secondary to thrombocytopenia.  Cycle 8 FOLFOX 12/17/2013.  Cycle 9 FOLFOX 01/04/2014. Oxaliplatin held secondary to neutropenia.   Cycle 10 FOLFOX 01/21/2014. Oxaliplatin held secondary to thrombocytopenia.  Cycle 11 FOLFOX 02/04/2014  Cycle 12 FOLFOX 02/18/2014, oxaliplatin dose reduced secondary tothrombocytopenia  CT abdomen/pelvis 01/30/2014 revealed splenomegaly and no evidence of recurrent colon cancer  CT chest 04/07/2014 with a stable right lower lobe nodule and no evidence for metastatic disease, no nodules seen on the CT 11/26/2014  Markedly elevated CEA 11/24/2014  CT 11/26/2014 revealed a right pelvic mass, splenomegaly, small volume ascites  Right salpingo-oophorectomy 12/28/2014 with the pathology confirming metastatic colon cancer  CTs 03/23/2016-no evidence of recurrent or metastatic disease.  CT 11/26/2016-enlargement of a fluid density structure the right pelvic sidewall, no other evidence of metastatic disease  CT aspiration right pelvic cyst 12/19/2016.Cytology-BENIGN REACTIVE/REPARATIVE CHANGES.  CTs 06/05/2017-no evidence of metastatic disease, mild cirrhotic changes with splenomegaly  CTs 12/11/2017-recurrent cystic right adnexal mass, similar to on the CT 11/26/2016, stable mild splenomegaly  CTs 04/23/2018-enlargement of cystic right adnexal mass with mural nodularity, no other evidence of metastatic disease  Cytoreductive surgery/HIPEC with mitomycin by Dr. Clovis Riley at [facility] 08/12/2018-R1 resection achieved. Cytoreduction included omentectomy, LAR, right salpingo-oophorectomy and left colonic gutter/pelvic stripping. Pathology on the rectum showed recurrent/metastatic adenocarcinoma, tumor 2.0 cm, predominantly involving the subserosa and muscularis propria of the colon, proximal and distal margins of resection were negative, vascular invasion present, metastatic carcinoma present in 1 out of 5 lymph nodes; omentum resection with no  malignancy seen, no metastatic carcinoma  identified in 1 lymph node examined; left gutter stripping positive metastatic adenocarcinoma; right ovary resection positive metastatic adenocarcinoma.  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  CEA 68 02/03/2019  Biopsy liver lesion 02/11/2019-metastatic adenocarcinoma consistent with primary colonic adenocarcinoma  CTs 02/27/2019-multiple liver lesions increased in size, new lesion in the lateral right lobe of the liver.  No evidence of metastatic disease in the chest.  Redemonstrated moderate left hydronephrosis and proximal hydroureter without discrete lesion or obstructing etiology at the transition point of the mid ureter.  Cycle 1 FOLFIRI/Panitumumab 03/12/2019 2. Mild elevation of the CEA beginning January 2016, normal on 05/19/2014   3. History of iron deficiency anemia  4. seizure disorder  5. history of depression  6. 4 mm right lower lobe nodule on a staging chest CT 09/08/2013 , stable on a CT 04/07/2014 7. Hospitalization 09/24/2013 through 09/26/2013 with fever and abdominal pain.  8. 09/24/2013 urine culture positive for coag negative staph.  9. 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 Splenomegaly noted on a CT scan 01/30/2014,persistent on repeat CTs 12. Colonoscopy 11/17/2018-flexible sigmoidoscopy per rectum with changes of mild diversion colitis. Scope advanced for approximately 25 cm. Most proximal portion had necrotic appearing debris. Colostomy bag insufflated suggesting some type of communication between the pouch and the proximal colon. Introduction of scope into the ostomy found to available directions. 1 toward the distal pouch with similar appearing necrotic debris encountered. The other was about a 30 cm segment of normal-appearing colonic mucosa to the  level of the previous right hemicolectomy ileocolonic anastomosis. 72. Port-A-Cath placement interventional radiology 03/05/2019  Disposition: [name] appears stable.  She is scheduled for cycle 1 FOLFIRI/Panitumumab today.  We again reviewed potential toxicities.  She agrees to proceed.  She plans to begin doxycycline this evening.  We reviewed the CBC from today.  Counts adequate to proceed with treatment.    We have listed clindamycin as an allergy due to the pruritic rash/lip ulcers.  She will return for lab, follow-up, cycle 2 FOLFIRI/Panitumumab in 2 weeks.  She will contact the office in the interim with any problems.    Ned Card ANP/GNP-BC   03/12/2019  11:07 AM

## 2019-03-12 NOTE — Patient Instructions (Addendum)
[name] Discharge Instructions for Patients Receiving Chemotherapy  Today you received the following chemotherapy agents: Irinotecan, Leucovorin, Fluorouracil, and Immunotherapy agent: Panitumumab.  To help prevent nausea and vomiting after your treatment, we encourage you to take your nausea medication as directed by your MD.   If you develop nausea and vomiting that is not controlled by your nausea medication, call the clinic.   BELOW ARE SYMPTOMS THAT SHOULD BE REPORTED IMMEDIATELY:  *FEVER GREATER THAN 100.5 F  *CHILLS WITH OR WITHOUT FEVER  NAUSEA AND VOMITING THAT IS NOT CONTROLLED WITH YOUR NAUSEA MEDICATION  *UNUSUAL SHORTNESS OF BREATH  *UNUSUAL BRUISING OR BLEEDING  TENDERNESS IN MOUTH AND THROAT WITH OR WITHOUT PRESENCE OF ULCERS  *URINARY PROBLEMS  *BOWEL PROBLEMS  UNUSUAL RASH Items with * indicate a potential emergency and should be followed up as soon as possible.  Feel free to call the clinic should you have any questions or concerns. The clinic phone number is [phone].  Please show the [facility] at check-in to the Emergency Department and triage nurse.  Coronavirus (COVID-19) Are you at risk?  Are you at risk for the Coronavirus (COVID-19)?  To be considered HIGH RISK for Coronavirus (COVID-19), you have to meet the following criteria:  . Traveled to [place], [place], [place], [place] or [place]; or in the [place] to [place], [place], [place], or [place]; and have fever, cough, and shortness of breath within the last 2 weeks of travel OR . Been in close contact with a person diagnosed with COVID-19 within the last 2 weeks and have fever, cough, and shortness of breath . IF YOU DO NOT MEET THESE CRITERIA, YOU ARE CONSIDERED LOW RISK FOR COVID-19.  What to do if you are HIGH RISK for COVID-19?  Marland Kitchen If you are having a medical emergency, call 911. . Seek medical care right away. Before you go to a doctor's office,  urgent care or emergency department, call ahead and tell them about your recent travel, contact with someone diagnosed with COVID-19, and your symptoms. You should receive instructions from your physician's office regarding next steps of care.  . When you arrive at healthcare provider, tell the healthcare staff immediately you have returned from visiting [place], [place], [place], [place] or [place]; or traveled in the [place] to [place], [place], [place], or [place]; in the last two weeks or you have been in close contact with a person diagnosed with COVID-19 in the last 2 weeks.   . Tell the health care staff about your symptoms: fever, cough and shortness of breath. . After you have been seen by a medical provider, you will be either: o Tested for (COVID-19) and discharged home on quarantine except to seek medical care if symptoms worsen, and asked to  - Stay home and avoid contact with others until you get your results (4-5 days)  - Avoid travel on public transportation if possible (such as bus, train, or airplane) or o Sent to the Emergency Department by EMS for evaluation, COVID-19 testing, and possible admission depending on your condition and test results.  What to do if you are LOW RISK for COVID-19?  Reduce your risk of any infection by using the same precautions used for avoiding the common cold or flu:  Marland Kitchen Wash your hands often with soap and warm water for at least 20 seconds.  If soap and water are not readily available, use an alcohol-based hand sanitizer with at  least 60% alcohol.  . If coughing or sneezing, cover your mouth and nose by coughing or sneezing into the elbow areas of your shirt or coat, into a tissue or into your sleeve (not your hands). . Avoid shaking hands with others and consider head nods or verbal greetings only. . Avoid touching your eyes, nose, or mouth with unwashed hands.  . Avoid close contact with people who are sick. . Avoid places or events with  large numbers of people in one location, like concerts or sporting events. . Carefully consider travel plans you have or are making. . If you are planning any travel outside or inside the [place], visit the CDC's Travelers' Health webpage for the latest health notices. . If you have some symptoms but not all symptoms, continue to monitor at home and seek medical attention if your symptoms worsen. . If you are having a medical emergency, call 911.   [place] / e-Visit: [email or web address]         [facility]: [facility]: [id number]                   [facility]: [phone]

## 2019-03-13 ENCOUNTER — Telehealth: Payer: Self-pay | Admitting: Oncology

## 2019-03-13 NOTE — Telephone Encounter (Signed)
Scheduled per los. Called and left msg. Mailed printout  °

## 2019-03-14 ENCOUNTER — Inpatient Hospital Stay: Payer: BC Managed Care – PPO

## 2019-03-14 ENCOUNTER — Other Ambulatory Visit: Payer: Self-pay

## 2019-03-14 VITALS — BP 149/81 | HR 68 | Temp 99.1°F | Resp 17

## 2019-03-14 DIAGNOSIS — C182 Malignant neoplasm of ascending colon: Secondary | ICD-10-CM | POA: Diagnosis not present

## 2019-03-14 DIAGNOSIS — D6959 Other secondary thrombocytopenia: Secondary | ICD-10-CM | POA: Diagnosis not present

## 2019-03-14 DIAGNOSIS — Z5112 Encounter for antineoplastic immunotherapy: Secondary | ICD-10-CM | POA: Diagnosis not present

## 2019-03-14 DIAGNOSIS — Z5111 Encounter for antineoplastic chemotherapy: Secondary | ICD-10-CM | POA: Diagnosis not present

## 2019-03-14 DIAGNOSIS — Z452 Encounter for adjustment and management of vascular access device: Secondary | ICD-10-CM | POA: Diagnosis not present

## 2019-03-14 DIAGNOSIS — C787 Secondary malignant neoplasm of liver and intrahepatic bile duct: Secondary | ICD-10-CM | POA: Diagnosis not present

## 2019-03-14 DIAGNOSIS — C7961 Secondary malignant neoplasm of right ovary: Secondary | ICD-10-CM | POA: Diagnosis not present

## 2019-03-14 DIAGNOSIS — G40909 Epilepsy, unspecified, not intractable, without status epilepticus: Secondary | ICD-10-CM | POA: Diagnosis not present

## 2019-03-14 DIAGNOSIS — D709 Neutropenia, unspecified: Secondary | ICD-10-CM | POA: Diagnosis not present

## 2019-03-14 DIAGNOSIS — R21 Rash and other nonspecific skin eruption: Secondary | ICD-10-CM | POA: Diagnosis not present

## 2019-03-14 MED ORDER — HEPARIN SOD (PORK) LOCK FLUSH 100 UNIT/ML IV SOLN
500.0000 [IU] | Freq: Once | INTRAVENOUS | Status: AC | PRN
  Administered 2019-03-14: 500 [IU]
  Filled 2019-03-14: qty 5

## 2019-03-14 MED ORDER — SODIUM CHLORIDE 0.9% FLUSH
10.0000 mL | INTRAVENOUS | Status: DC | PRN
  Administered 2019-03-14: 10 mL
  Filled 2019-03-14: qty 10

## 2019-03-14 NOTE — Patient Instructions (Signed)

## 2019-03-18 ENCOUNTER — Encounter: Payer: Self-pay | Admitting: Nurse Practitioner

## 2019-03-20 ENCOUNTER — Other Ambulatory Visit: Payer: Self-pay | Admitting: Nurse Practitioner

## 2019-03-20 ENCOUNTER — Encounter: Payer: Self-pay | Admitting: Nurse Practitioner

## 2019-03-20 ENCOUNTER — Telehealth: Payer: Self-pay

## 2019-03-20 DIAGNOSIS — C182 Malignant neoplasm of ascending colon: Secondary | ICD-10-CM

## 2019-03-20 MED ORDER — FLUCONAZOLE 150 MG PO TABS: 150.0000 mg | ORAL_TABLET | Freq: Once | ORAL | 0 refills | Status: DC

## 2019-03-20 NOTE — Telephone Encounter (Signed)
TC to pt per Ned Card NP to let her know that lisa sent a prescription to her pharmacy for Diflucan. She may have a vaginal yeast infection. If symptoms do not improve after Diflucan she should follow-up with her gynecologist. We recommend her husband follow-up with his PCP. Pt verbalized understanding. No further problems or concerns at this time.

## 2019-03-22 ENCOUNTER — Other Ambulatory Visit: Payer: Self-pay | Admitting: Oncology

## 2019-03-26 ENCOUNTER — Inpatient Hospital Stay: Payer: BC Managed Care – PPO

## 2019-03-26 ENCOUNTER — Other Ambulatory Visit: Payer: Self-pay | Admitting: *Deleted

## 2019-03-26 ENCOUNTER — Inpatient Hospital Stay (HOSPITAL_BASED_OUTPATIENT_CLINIC_OR_DEPARTMENT_OTHER): Payer: BC Managed Care – PPO | Admitting: Oncology

## 2019-03-26 ENCOUNTER — Other Ambulatory Visit: Payer: BC Managed Care – PPO

## 2019-03-26 ENCOUNTER — Other Ambulatory Visit: Payer: Self-pay

## 2019-03-26 VITALS — BP 121/76 | HR 83 | Temp 98.7°F | Resp 18 | Ht 66.0 in | Wt 148.2 lb

## 2019-03-26 DIAGNOSIS — C182 Malignant neoplasm of ascending colon: Secondary | ICD-10-CM

## 2019-03-26 DIAGNOSIS — Z452 Encounter for adjustment and management of vascular access device: Secondary | ICD-10-CM | POA: Diagnosis not present

## 2019-03-26 DIAGNOSIS — Z5111 Encounter for antineoplastic chemotherapy: Secondary | ICD-10-CM | POA: Diagnosis not present

## 2019-03-26 DIAGNOSIS — D6959 Other secondary thrombocytopenia: Secondary | ICD-10-CM | POA: Diagnosis not present

## 2019-03-26 DIAGNOSIS — C7961 Secondary malignant neoplasm of right ovary: Secondary | ICD-10-CM | POA: Diagnosis not present

## 2019-03-26 DIAGNOSIS — D709 Neutropenia, unspecified: Secondary | ICD-10-CM | POA: Diagnosis not present

## 2019-03-26 DIAGNOSIS — G40909 Epilepsy, unspecified, not intractable, without status epilepticus: Secondary | ICD-10-CM | POA: Diagnosis not present

## 2019-03-26 DIAGNOSIS — Z5112 Encounter for antineoplastic immunotherapy: Secondary | ICD-10-CM | POA: Diagnosis not present

## 2019-03-26 DIAGNOSIS — C787 Secondary malignant neoplasm of liver and intrahepatic bile duct: Secondary | ICD-10-CM

## 2019-03-26 DIAGNOSIS — R21 Rash and other nonspecific skin eruption: Secondary | ICD-10-CM | POA: Diagnosis not present

## 2019-03-26 LAB — CMP (CANCER CENTER ONLY)
ALT: 15 U/L (ref 0–44)
AST: 17 U/L (ref 15–41)
Albumin: 3.8 g/dL (ref 3.5–5.0)
Alkaline Phosphatase: 103 U/L (ref 38–126)
Anion gap: 7 (ref 5–15)
BUN: 10 mg/dL (ref 6–20)
CO2: 26 mmol/L (ref 22–32)
Calcium: 9.4 mg/dL (ref 8.9–10.3)
Chloride: 109 mmol/L (ref 98–111)
Creatinine: 0.72 mg/dL (ref 0.44–1.00)
GFR, Est AFR Am: >60 mL/min (ref >60)
GFR, Estimated: >60 mL/min (ref >60)
Glucose, Bld: 139 mg/dL — ABNORMAL HIGH (ref 70–99)
Potassium: 3.8 mmol/L (ref 3.5–5.1)
Sodium: 142 mmol/L (ref 135–145)
Total Bilirubin: 0.4 mg/dL (ref 0.3–1.2)
Total Protein: 7 g/dL (ref 6.5–8.1)

## 2019-03-26 LAB — CBC WITH DIFFERENTIAL (CANCER CENTER ONLY)
Abs Immature Granulocytes: 0 10*3/uL (ref 0.00–0.07)
Basophils Absolute: 0 10*3/uL (ref 0.0–0.1)
Basophils Relative: 0 %
Eosinophils Absolute: 0.1 10*3/uL (ref 0.0–0.5)
Eosinophils Relative: 4 %
HCT: 37.1 % (ref 36.0–46.0)
Hemoglobin: 11.7 g/dL — ABNORMAL LOW (ref 12.0–15.0)
Immature Granulocytes: 0 %
Lymphocytes Relative: 45 %
Lymphs Abs: 1.1 10*3/uL (ref 0.7–4.0)
MCH: 23.9 pg — ABNORMAL LOW (ref 26.0–34.0)
MCHC: 31.5 g/dL (ref 30.0–36.0)
MCV: 75.7 fL — ABNORMAL LOW (ref 80.0–100.0)
Monocytes Absolute: 0.2 10*3/uL (ref 0.1–1.0)
Monocytes Relative: 8 %
Neutro Abs: 1 10*3/uL — ABNORMAL LOW (ref 1.7–7.7)
Neutrophils Relative %: 43 %
Platelet Count: 113 10*3/uL — ABNORMAL LOW (ref 150–400)
RBC: 4.9 MIL/uL (ref 3.87–5.11)
RDW: 14.9 % (ref 11.5–15.5)
WBC Count: 2.4 10*3/uL — ABNORMAL LOW (ref 4.0–10.5)
nRBC: 0 % (ref 0.0–0.2)

## 2019-03-26 MED ORDER — FLUTICASONE PROPIONATE 0.05 % EX CREA: TOPICAL_CREAM | Freq: Two times a day (BID) | CUTANEOUS | 1 refills | Status: DC

## 2019-03-26 MED ORDER — SODIUM CHLORIDE 0.9 % IV SOLN
2400.0000 mg/m2 | INTRAVENOUS | Status: DC
  Administered 2019-03-26: 4300 mg via INTRAVENOUS
  Filled 2019-03-26: qty 86

## 2019-03-26 MED ORDER — SODIUM CHLORIDE 0.9 % IV SOLN
Freq: Once | INTRAVENOUS | Status: AC
  Filled 2019-03-26: qty 250

## 2019-03-26 MED ORDER — SODIUM CHLORIDE 0.9 % IV SOLN
6.0000 mg/kg | Freq: Once | INTRAVENOUS | Status: AC
  Administered 2019-03-26: 12:00:00 400 mg via INTRAVENOUS
  Filled 2019-03-26: qty 20

## 2019-03-26 NOTE — Patient Instructions (Signed)
[facility] Discharge Instructions for Patients Receiving Chemotherapy  Today you received the following chemotherapy agents Panitumumab; 5-FU  To help prevent nausea and vomiting after your treatment, we encourage you to take your nausea medication as directed   If you develop nausea and vomiting that is not controlled by your nausea medication, call the clinic.   BELOW ARE SYMPTOMS THAT SHOULD BE REPORTED IMMEDIATELY:  *FEVER GREATER THAN 100.5 F  *CHILLS WITH OR WITHOUT FEVER  NAUSEA AND VOMITING THAT IS NOT CONTROLLED WITH YOUR NAUSEA MEDICATION  *UNUSUAL SHORTNESS OF BREATH  *UNUSUAL BRUISING OR BLEEDING  TENDERNESS IN MOUTH AND THROAT WITH OR WITHOUT PRESENCE OF ULCERS  *URINARY PROBLEMS  *BOWEL PROBLEMS  UNUSUAL RASH Items with * indicate a potential emergency and should be followed up as soon as possible.  Feel free to call the clinic should you have any questions or concerns. The clinic phone number is [phone].  Please show the [facility] at check-in to the Emergency Department and triage nurse.

## 2019-03-26 NOTE — Progress Notes (Signed)
Per Dr. Benay Spice: OK to treat today w/ANC 1.0. Will only give 5FU pump and panitumumab today. Working on [employer] for next cycle. Will recheck counts next week.

## 2019-03-26 NOTE — Progress Notes (Signed)
Patient returned to clinic at 1330 c/o leak in her 5-FU pump.  She verbalized she did not feel any dampness at the connection points, but the bag was wet and it got on her jeans.  She changed her jeans when she got home and washed her leg.  It was unclear wear the bag was leaking. Loren RN was a witness with this Therapist, sports.  Loren checked the new bag this this RN to verify dosing.  No harm was done to the patient. Patient instructed to wash her jeans separately from other clothing.

## 2019-03-26 NOTE — Progress Notes (Signed)
[place] OFFICE PROGRESS NOTE   Diagnosis: Colon cancer  INTERVAL HISTORY:   [name] completed the first cycle of FOLFIRI/Panitumumab on 03/12/2019.  She developed rhinorrhea and a hot feeling during the irinotecan infusion.  This resolved after receiving atropine.  She had mild diarrhea following chemotherapy.  No mouth sores.  She has developed a rash over the face. She had vaginal pruritus last week.  This resolved after a dose of Diflucan. Objective:  Vital signs in last 24 hours:  Blood pressure 121/76, pulse 83, temperature 98.7 F (37.1 C), temperature source Temporal, resp. rate 18, height 5' 6"  (1.676 m), weight 148 lb 3.2 oz (67.2 kg), SpO2 100 %.    HEENT: No thrush Resp: Lungs clear bilaterally Cardio: Regular rate and rhythm GI: No hepatosplenomegaly, firmness in the left lower abdomen, no discrete mass, nontender Vascular: No leg edema  Skin: Acne type rash at the lower face/chin, few erythematous pustular lesions over the trunk, few linear ulcers at the distal fingers  Portacath/PICC-without erythema  Lab Results:  Lab Results  Component Value Date   WBC 5.2 03/12/2019   HGB 11.3 (L) 03/12/2019   HCT 36.5 03/12/2019   MCV 77.0 (L) 03/12/2019   PLT 169 03/12/2019   NEUTROABS 3.2 03/12/2019    CMP  Lab Results  Component Value Date   NA 140 03/12/2019   K 3.4 (L) 03/12/2019   CL 104 03/12/2019   CO2 27 03/12/2019   GLUCOSE 197 (H) 03/12/2019   BUN 22 (H) 03/12/2019   CREATININE 1.05 (H) 03/12/2019   CALCIUM 9.2 03/12/2019   PROT 7.2 03/12/2019   ALBUMIN 4.0 03/12/2019   AST 20 03/12/2019   ALT 20 03/12/2019   ALKPHOS 83 03/12/2019   BILITOT 0.6 03/12/2019   GFRNONAA >60 03/12/2019   GFRAA >60 03/12/2019    Lab Results  Component Value Date   CEA1 150.18 (H) 03/12/2019     Medications: I have reviewed the patient's current medications.   Assessment/Plan: 1. 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  The tumor returned microsatellite stable with no loss of mismatch repair protein expression   APC mutated. No BRAF, KRAS, or NRAS mutation On [facility] 1 testing   Cycle 1 adjuvant FOLFOX 09/08/2013   Cycle 2 adjuvant FOLFOX 09/24/2013   Cycle 3 adjuvant FOLFOX 10/08/2013.   Cycle 4 adjuvant FOLFOX 10/22/2013.   Cycle 5 adjuvant FOLFOX 11/05/2013. Oxaliplatin held due to thrombocytopenia.  Cycle 6 FOLFOX 11/19/2013.  Cycle 7 FOLFOX 12/03/2013. Oxaliplatin held secondary to thrombocytopenia.  Cycle 8 FOLFOX 12/17/2013.  Cycle 9 FOLFOX 01/04/2014. Oxaliplatin held secondary to neutropenia.   Cycle 10 FOLFOX 01/21/2014. Oxaliplatin held secondary to thrombocytopenia.  Cycle 11 FOLFOX 02/04/2014  Cycle 12 FOLFOX 02/18/2014, oxaliplatin dose reduced secondary tothrombocytopenia  CT abdomen/pelvis 01/30/2014 revealed splenomegaly and no evidence of recurrent colon cancer  CT chest 04/07/2014 with a stable right lower lobe nodule and no evidence for metastatic disease, no nodules seen on the CT 11/26/2014  Markedly elevated CEA 11/24/2014  CT 11/26/2014 revealed a right pelvic mass, splenomegaly, small volume ascites  Right salpingo-oophorectomy 12/28/2014 with the pathology confirming metastatic colon cancer  CTs 03/23/2016-no evidence of recurrent or metastatic disease.  CT 11/26/2016-enlargement of a fluid density structure the right pelvic sidewall, no other evidence of metastatic disease  CT aspiration right pelvic cyst 12/19/2016.Cytology-BENIGN REACTIVE/REPARATIVE CHANGES.  CTs 06/05/2017-no evidence of metastatic disease, mild cirrhotic changes with  splenomegaly  CTs 12/11/2017-recurrent cystic right adnexal mass, similar to on the CT 11/26/2016, stable mild splenomegaly  CTs 04/23/2018-enlargement of cystic right adnexal mass with mural nodularity, no other evidence of  metastatic disease  Cytoreductive surgery/HIPEC with mitomycin by Dr. Clovis Riley at [facility] 08/12/2018-R1 resection achieved. Cytoreduction included omentectomy, LAR, right salpingo-oophorectomy and left colonic gutter/pelvic stripping. Pathology on the rectum showed recurrent/metastatic adenocarcinoma, tumor 2.0 cm, predominantly involving the subserosa and muscularis propria of the colon, proximal and distal margins of resection were negative, vascular invasion present, metastatic carcinoma present in 1 out of 5 lymph nodes; omentum resection with no malignancy seen, no metastatic carcinoma identified in 1 lymph node examined; left gutter stripping positive metastatic adenocarcinoma; right ovary resection positive metastatic adenocarcinoma.  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  CEA 68 02/03/2019  Biopsy liver lesion 02/11/2019-metastatic adenocarcinoma consistent with primary colonic adenocarcinoma  CTs 02/27/2019-multiple liver lesions increased in size, new lesion in the lateral right lobe of the liver.  No evidence of metastatic disease in the chest.  Redemonstrated moderate left hydronephrosis and proximal hydroureter without discrete lesion or obstructing etiology at the transition point of the mid ureter.  Cycle 1 FOLFIRI/Panitumumab 03/12/2019  Cycle 2 FOLFIRI/Panitumumab 03/26/2019-bolus 5-FU and irinotecan held secondary to neutropenia 2. Mild elevation of the CEA beginning January 2016, normal on 05/19/2014   3. History of iron deficiency anemia  4. seizure disorder  5. history of depression  6. 4 mm right lower lobe nodule on a staging chest CT 09/08/2013 , stable on a CT 04/07/2014 7. Hospitalization 09/24/2013 through 09/26/2013 with fever and abdominal pain.  8. 09/24/2013 urine culture positive for coag negative staph.  9. 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 Splenomegaly noted on a CT scan 01/30/2014,persistent on repeat CTs 12. Colonoscopy 11/17/2018-flexible sigmoidoscopy per rectum with changes of mild diversion colitis. Scope advanced for approximately 25 cm. Most proximal portion had necrotic appearing debris. Colostomy bag insufflated suggesting some type of communication between the pouch and the proximal colon. Introduction of scope into the ostomy found to available directions. 1 toward the distal pouch with similar appearing necrotic debris encountered. The other was about a 30 cm segment of normal-appearing colonic mucosa to the level of the previous right hemicolectomy ileocolonic anastomosis. 30. Port-A-Cath placement interventional radiology 03/05/2019    Disposition: [name] appears well.  She has developed a Panitumumab skin rash.  We prescribed fluticasone cream to use as needed on the face.  She continues doxycycline.  She has neutropenia today.  She will call for any fever or symptoms of infection.  Her insurance company will not approve Neulasta today.  She would like to proceed with treatment today.  She will receive an usual 5-fluorouracil and Panitumumab today.  She will return for a CBC in 1 week.  The plan is to add G-CSF with the next cycle of chemotherapy.  We reviewed potential toxicities associated with G-CSF including the chance of bone pain and splenic rupture.  She agrees to proceed.  She will return for an office visit and chemotherapy in 2 weeks.  Betsy Coder, MD  03/26/2019  10:35 AM

## 2019-03-27 ENCOUNTER — Telehealth: Payer: Self-pay | Admitting: Oncology

## 2019-03-27 NOTE — Telephone Encounter (Signed)
Scheduled per los. Called and spoke with patient. Confirmed appt 

## 2019-03-28 ENCOUNTER — Inpatient Hospital Stay: Payer: BC Managed Care – PPO

## 2019-03-28 VITALS — BP 130/74 | HR 87 | Temp 99.1°F

## 2019-03-28 DIAGNOSIS — D709 Neutropenia, unspecified: Secondary | ICD-10-CM | POA: Diagnosis not present

## 2019-03-28 DIAGNOSIS — Z452 Encounter for adjustment and management of vascular access device: Secondary | ICD-10-CM | POA: Diagnosis not present

## 2019-03-28 DIAGNOSIS — C182 Malignant neoplasm of ascending colon: Secondary | ICD-10-CM

## 2019-03-28 DIAGNOSIS — G40909 Epilepsy, unspecified, not intractable, without status epilepticus: Secondary | ICD-10-CM | POA: Diagnosis not present

## 2019-03-28 DIAGNOSIS — Z5112 Encounter for antineoplastic immunotherapy: Secondary | ICD-10-CM | POA: Diagnosis not present

## 2019-03-28 DIAGNOSIS — C7961 Secondary malignant neoplasm of right ovary: Secondary | ICD-10-CM | POA: Diagnosis not present

## 2019-03-28 DIAGNOSIS — C787 Secondary malignant neoplasm of liver and intrahepatic bile duct: Secondary | ICD-10-CM | POA: Diagnosis not present

## 2019-03-28 DIAGNOSIS — R21 Rash and other nonspecific skin eruption: Secondary | ICD-10-CM | POA: Diagnosis not present

## 2019-03-28 DIAGNOSIS — Z5111 Encounter for antineoplastic chemotherapy: Secondary | ICD-10-CM | POA: Diagnosis not present

## 2019-03-28 DIAGNOSIS — D6959 Other secondary thrombocytopenia: Secondary | ICD-10-CM | POA: Diagnosis not present

## 2019-03-28 MED ORDER — SODIUM CHLORIDE 0.9% FLUSH
10.0000 mL | INTRAVENOUS | Status: DC | PRN
  Administered 2019-03-28: 13:00:00 10 mL
  Filled 2019-03-28: qty 10

## 2019-03-28 MED ORDER — HEPARIN SOD (PORK) LOCK FLUSH 100 UNIT/ML IV SOLN
500.0000 [IU] | Freq: Once | INTRAVENOUS | Status: AC | PRN
  Administered 2019-03-28: 500 [IU]
  Filled 2019-03-28: qty 5

## 2019-04-01 ENCOUNTER — Other Ambulatory Visit: Payer: Self-pay | Admitting: Nurse Practitioner

## 2019-04-01 DIAGNOSIS — C182 Malignant neoplasm of ascending colon: Secondary | ICD-10-CM

## 2019-04-01 MED ORDER — LORAZEPAM 0.5 MG PO TABS: 0.5000 mg | ORAL_TABLET | Freq: Two times a day (BID) | ORAL | 0 refills | Status: DC | PRN

## 2019-04-02 ENCOUNTER — Other Ambulatory Visit: Payer: Self-pay

## 2019-04-02 ENCOUNTER — Inpatient Hospital Stay: Payer: BC Managed Care – PPO

## 2019-04-02 DIAGNOSIS — C182 Malignant neoplasm of ascending colon: Secondary | ICD-10-CM

## 2019-04-02 DIAGNOSIS — R21 Rash and other nonspecific skin eruption: Secondary | ICD-10-CM | POA: Diagnosis not present

## 2019-04-02 DIAGNOSIS — D6959 Other secondary thrombocytopenia: Secondary | ICD-10-CM | POA: Diagnosis not present

## 2019-04-02 DIAGNOSIS — Z5112 Encounter for antineoplastic immunotherapy: Secondary | ICD-10-CM | POA: Diagnosis not present

## 2019-04-02 DIAGNOSIS — C787 Secondary malignant neoplasm of liver and intrahepatic bile duct: Secondary | ICD-10-CM | POA: Diagnosis not present

## 2019-04-02 DIAGNOSIS — D709 Neutropenia, unspecified: Secondary | ICD-10-CM | POA: Diagnosis not present

## 2019-04-02 DIAGNOSIS — C7961 Secondary malignant neoplasm of right ovary: Secondary | ICD-10-CM | POA: Diagnosis not present

## 2019-04-02 DIAGNOSIS — Z452 Encounter for adjustment and management of vascular access device: Secondary | ICD-10-CM | POA: Diagnosis not present

## 2019-04-02 DIAGNOSIS — G40909 Epilepsy, unspecified, not intractable, without status epilepticus: Secondary | ICD-10-CM | POA: Diagnosis not present

## 2019-04-02 DIAGNOSIS — Z5111 Encounter for antineoplastic chemotherapy: Secondary | ICD-10-CM | POA: Diagnosis not present

## 2019-04-02 LAB — CBC WITH DIFFERENTIAL (CANCER CENTER ONLY)
Abs Immature Granulocytes: 0.03 10*3/uL (ref 0.00–0.07)
Basophils Absolute: 0 10*3/uL (ref 0.0–0.1)
Basophils Relative: 1 %
Eosinophils Absolute: 0.1 10*3/uL (ref 0.0–0.5)
Eosinophils Relative: 2 %
HCT: 35.9 % — ABNORMAL LOW (ref 36.0–46.0)
Hemoglobin: 11.3 g/dL — ABNORMAL LOW (ref 12.0–15.0)
Immature Granulocytes: 1 %
Lymphocytes Relative: 38 %
Lymphs Abs: 0.8 10*3/uL (ref 0.7–4.0)
MCH: 24.4 pg — ABNORMAL LOW (ref 26.0–34.0)
MCHC: 31.5 g/dL (ref 30.0–36.0)
MCV: 77.5 fL — ABNORMAL LOW (ref 80.0–100.0)
Monocytes Absolute: 0.3 10*3/uL (ref 0.1–1.0)
Monocytes Relative: 12 %
Neutro Abs: 1 10*3/uL — ABNORMAL LOW (ref 1.7–7.7)
Neutrophils Relative %: 46 %
Platelet Count: 164 10*3/uL (ref 150–400)
RBC: 4.63 MIL/uL (ref 3.87–5.11)
RDW: 15.8 % — ABNORMAL HIGH (ref 11.5–15.5)
WBC Count: 2.1 10*3/uL — ABNORMAL LOW (ref 4.0–10.5)
nRBC: 0 % (ref 0.0–0.2)

## 2019-04-03 ENCOUNTER — Telehealth: Payer: Self-pay | Admitting: *Deleted

## 2019-04-03 ENCOUNTER — Encounter: Payer: Self-pay | Admitting: Oncology

## 2019-04-03 DIAGNOSIS — C182 Malignant neoplasm of ascending colon: Secondary | ICD-10-CM | POA: Diagnosis not present

## 2019-04-03 DIAGNOSIS — L02211 Cutaneous abscess of abdominal wall: Secondary | ICD-10-CM | POA: Diagnosis not present

## 2019-04-03 DIAGNOSIS — Z433 Encounter for attention to colostomy: Secondary | ICD-10-CM | POA: Diagnosis not present

## 2019-04-03 DIAGNOSIS — C189 Malignant neoplasm of colon, unspecified: Secondary | ICD-10-CM | POA: Diagnosis not present

## 2019-04-03 NOTE — Telephone Encounter (Signed)
-----   Message from Emily Pier, MD sent at 04/02/2019  5:17 PM EST ----- Please call patient, white blood count is stable, platelets have improved, call for fever, follow-up as scheduled

## 2019-04-03 NOTE — Telephone Encounter (Signed)
Further discussion with patient and she reports the area where prior stoma was located is very red and tender to touch and hurts to ambulate as well. Denies fever. Was slightly red yesterday, but ver red today. It is pouching out as if something is trying to come out--denies any drainage. Discussed situation with Dr. Benay Spice. She needs to call her surgeon at [facility] to be seen today. Patient notified and agrees to call. This RN called back few minutes later and she reports she has been in conversation with the triage nurse at [facility].

## 2019-04-03 NOTE — Telephone Encounter (Signed)
Notified of message below.   She states she left a voice mail. Colostomy incision is " really red and swollen, has some oozing". She has not called surgeon who placed the colostomy. Is going to send a picture on MyChart

## 2019-04-04 ENCOUNTER — Other Ambulatory Visit: Payer: Self-pay | Admitting: Oncology

## 2019-04-06 ENCOUNTER — Other Ambulatory Visit: Payer: Self-pay | Admitting: *Deleted

## 2019-04-09 ENCOUNTER — Inpatient Hospital Stay: Payer: BC Managed Care – PPO

## 2019-04-09 ENCOUNTER — Inpatient Hospital Stay: Payer: BC Managed Care – PPO | Attending: Oncology | Admitting: Nurse Practitioner

## 2019-04-09 ENCOUNTER — Observation Stay (HOSPITAL_COMMUNITY)
Admission: EM | Admit: 2019-04-09 | Discharge: 2019-04-10 | Disposition: A | Discharge status: Home / Self Care | Payer: BC Managed Care – PPO | Attending: Internal Medicine | Admitting: Internal Medicine

## 2019-04-09 ENCOUNTER — Encounter: Payer: Self-pay | Admitting: Nurse Practitioner

## 2019-04-09 ENCOUNTER — Encounter (HOSPITAL_COMMUNITY): Payer: Self-pay

## 2019-04-09 ENCOUNTER — Emergency Department (HOSPITAL_COMMUNITY): Payer: BC Managed Care – PPO

## 2019-04-09 ENCOUNTER — Other Ambulatory Visit: Payer: Self-pay

## 2019-04-09 VITALS — BP 129/82 | HR 80 | Temp 98.5°F | Resp 16 | Wt 149.2 lb

## 2019-04-09 DIAGNOSIS — G40909 Epilepsy, unspecified, not intractable, without status epilepticus: Secondary | ICD-10-CM | POA: Insufficient documentation

## 2019-04-09 DIAGNOSIS — Z79899 Other long term (current) drug therapy: Secondary | ICD-10-CM | POA: Insufficient documentation

## 2019-04-09 DIAGNOSIS — C189 Malignant neoplasm of colon, unspecified: Secondary | ICD-10-CM | POA: Insufficient documentation

## 2019-04-09 DIAGNOSIS — R404 Transient alteration of awareness: Secondary | ICD-10-CM | POA: Diagnosis not present

## 2019-04-09 DIAGNOSIS — C7961 Secondary malignant neoplasm of right ovary: Secondary | ICD-10-CM | POA: Diagnosis not present

## 2019-04-09 DIAGNOSIS — Z882 Allergy status to sulfonamides status: Secondary | ICD-10-CM | POA: Diagnosis not present

## 2019-04-09 DIAGNOSIS — R161 Splenomegaly, not elsewhere classified: Secondary | ICD-10-CM | POA: Diagnosis not present

## 2019-04-09 DIAGNOSIS — Z452 Encounter for adjustment and management of vascular access device: Secondary | ICD-10-CM | POA: Diagnosis not present

## 2019-04-09 DIAGNOSIS — R569 Unspecified convulsions: Secondary | ICD-10-CM | POA: Diagnosis not present

## 2019-04-09 DIAGNOSIS — D509 Iron deficiency anemia, unspecified: Secondary | ICD-10-CM | POA: Diagnosis not present

## 2019-04-09 DIAGNOSIS — Z90721 Acquired absence of ovaries, unilateral: Secondary | ICD-10-CM | POA: Diagnosis not present

## 2019-04-09 DIAGNOSIS — R21 Rash and other nonspecific skin eruption: Secondary | ICD-10-CM | POA: Diagnosis not present

## 2019-04-09 DIAGNOSIS — D709 Neutropenia, unspecified: Secondary | ICD-10-CM | POA: Insufficient documentation

## 2019-04-09 DIAGNOSIS — F419 Anxiety disorder, unspecified: Secondary | ICD-10-CM | POA: Insufficient documentation

## 2019-04-09 DIAGNOSIS — K760 Fatty (change of) liver, not elsewhere classified: Secondary | ICD-10-CM | POA: Insufficient documentation

## 2019-04-09 DIAGNOSIS — Z03818 Encounter for observation for suspected exposure to other biological agents ruled out: Secondary | ICD-10-CM | POA: Diagnosis not present

## 2019-04-09 DIAGNOSIS — Z885 Allergy status to narcotic agent status: Secondary | ICD-10-CM | POA: Insufficient documentation

## 2019-04-09 DIAGNOSIS — R410 Disorientation, unspecified: Secondary | ICD-10-CM | POA: Diagnosis not present

## 2019-04-09 DIAGNOSIS — C787 Secondary malignant neoplasm of liver and intrahepatic bile duct: Secondary | ICD-10-CM | POA: Diagnosis not present

## 2019-04-09 DIAGNOSIS — Z881 Allergy status to other antibiotic agents status: Secondary | ICD-10-CM | POA: Insufficient documentation

## 2019-04-09 DIAGNOSIS — B373 Candidiasis of vulva and vagina: Secondary | ICD-10-CM | POA: Insufficient documentation

## 2019-04-09 DIAGNOSIS — Z880 Allergy status to penicillin: Secondary | ICD-10-CM | POA: Insufficient documentation

## 2019-04-09 DIAGNOSIS — Z20822 Contact with and (suspected) exposure to covid-19: Secondary | ICD-10-CM | POA: Insufficient documentation

## 2019-04-09 DIAGNOSIS — C182 Malignant neoplasm of ascending colon: Secondary | ICD-10-CM | POA: Diagnosis not present

## 2019-04-09 DIAGNOSIS — E1165 Type 2 diabetes mellitus with hyperglycemia: Secondary | ICD-10-CM | POA: Diagnosis not present

## 2019-04-09 DIAGNOSIS — Z9049 Acquired absence of other specified parts of digestive tract: Secondary | ICD-10-CM | POA: Diagnosis not present

## 2019-04-09 DIAGNOSIS — Z888 Allergy status to other drugs, medicaments and biological substances status: Secondary | ICD-10-CM | POA: Diagnosis not present

## 2019-04-09 DIAGNOSIS — K219 Gastro-esophageal reflux disease without esophagitis: Secondary | ICD-10-CM | POA: Diagnosis not present

## 2019-04-09 DIAGNOSIS — K632 Fistula of intestine: Secondary | ICD-10-CM | POA: Diagnosis not present

## 2019-04-09 DIAGNOSIS — G40409 Other generalized epilepsy and epileptic syndromes, not intractable, without status epilepticus: Principal | ICD-10-CM | POA: Insufficient documentation

## 2019-04-09 DIAGNOSIS — Z95828 Presence of other vascular implants and grafts: Secondary | ICD-10-CM

## 2019-04-09 DIAGNOSIS — Z91012 Allergy to eggs: Secondary | ICD-10-CM | POA: Diagnosis not present

## 2019-04-09 DIAGNOSIS — Z5112 Encounter for antineoplastic immunotherapy: Secondary | ICD-10-CM | POA: Diagnosis not present

## 2019-04-09 DIAGNOSIS — Z5111 Encounter for antineoplastic chemotherapy: Secondary | ICD-10-CM | POA: Insufficient documentation

## 2019-04-09 DIAGNOSIS — R509 Fever, unspecified: Secondary | ICD-10-CM | POA: Insufficient documentation

## 2019-04-09 LAB — CBC WITH DIFFERENTIAL (CANCER CENTER ONLY)
Abs Immature Granulocytes: 0.04 10*3/uL (ref 0.00–0.07)
Basophils Absolute: 0 10*3/uL (ref 0.0–0.1)
Basophils Relative: 0 %
Eosinophils Absolute: 0 10*3/uL (ref 0.0–0.5)
Eosinophils Relative: 1 %
HCT: 36.2 % (ref 36.0–46.0)
Hemoglobin: 11.2 g/dL — ABNORMAL LOW (ref 12.0–15.0)
Immature Granulocytes: 1 %
Lymphocytes Relative: 26 %
Lymphs Abs: 1 10*3/uL (ref 0.7–4.0)
MCH: 23.9 pg — ABNORMAL LOW (ref 26.0–34.0)
MCHC: 30.9 g/dL (ref 30.0–36.0)
MCV: 77.2 fL — ABNORMAL LOW (ref 80.0–100.0)
Monocytes Absolute: 0.2 10*3/uL (ref 0.1–1.0)
Monocytes Relative: 6 %
Neutro Abs: 2.5 10*3/uL (ref 1.7–7.7)
Neutrophils Relative %: 66 %
Platelet Count: 173 10*3/uL (ref 150–400)
RBC: 4.69 MIL/uL (ref 3.87–5.11)
RDW: 15.9 % — ABNORMAL HIGH (ref 11.5–15.5)
WBC Count: 3.8 10*3/uL — ABNORMAL LOW (ref 4.0–10.5)
nRBC: 0 % (ref 0.0–0.2)

## 2019-04-09 LAB — CMP (CANCER CENTER ONLY)
ALT: 10 U/L (ref 0–44)
AST: 14 U/L — ABNORMAL LOW (ref 15–41)
Albumin: 3.7 g/dL (ref 3.5–5.0)
Alkaline Phosphatase: 96 U/L (ref 38–126)
Anion gap: 10 (ref 5–15)
BUN: 8 mg/dL (ref 6–20)
CO2: 26 mmol/L (ref 22–32)
Calcium: 9 mg/dL (ref 8.9–10.3)
Chloride: 107 mmol/L (ref 98–111)
Creatinine: 0.74 mg/dL (ref 0.44–1.00)
GFR, Est AFR Am: >60 mL/min (ref >60)
GFR, Estimated: >60 mL/min (ref >60)
Glucose, Bld: 180 mg/dL — ABNORMAL HIGH (ref 70–99)
Potassium: 3.7 mmol/L (ref 3.5–5.1)
Sodium: 143 mmol/L (ref 135–145)
Total Bilirubin: 0.4 mg/dL (ref 0.3–1.2)
Total Protein: 6.8 g/dL (ref 6.5–8.1)

## 2019-04-09 LAB — CEA (IN HOUSE-CHCC): CEA (CHCC-In House): 59.43 ng/mL — ABNORMAL HIGH (ref 0.00–5.00)

## 2019-04-09 MED ORDER — SODIUM CHLORIDE 0.9 % IV SOLN
Freq: Once | INTRAVENOUS | Status: AC
  Filled 2019-04-09: qty 250

## 2019-04-09 MED ORDER — LORAZEPAM 2 MG/ML IJ SOLN
2.0000 mg | Freq: Once | INTRAMUSCULAR | Status: AC | PRN
  Administered 2019-04-10: 2 mg via INTRAVENOUS
  Filled 2019-04-09 (×2): qty 1

## 2019-04-09 MED ORDER — SODIUM CHLORIDE 0.9 % IV SOLN
6.0000 mg/kg | Freq: Once | INTRAVENOUS | Status: AC
  Administered 2019-04-09: 400 mg via INTRAVENOUS
  Filled 2019-04-09: qty 20

## 2019-04-09 MED ORDER — SODIUM CHLORIDE 0.9% FLUSH
10.0000 mL | INTRAVENOUS | Status: DC | PRN
  Administered 2019-04-09: 10 mL via INTRAVENOUS
  Filled 2019-04-09: qty 10

## 2019-04-09 MED ORDER — LAMOTRIGINE 25 MG PO TABS: 100.0000 mg | ORAL_TABLET | Freq: Once | ORAL | Status: DC

## 2019-04-09 MED ORDER — SODIUM CHLORIDE 0.9 % IV SOLN
2400.0000 mg/m2 | INTRAVENOUS | Status: DC
  Administered 2019-04-09: 4300 mg via INTRAVENOUS
  Filled 2019-04-09: qty 86

## 2019-04-09 MED ORDER — FLUOROURACIL CHEMO INJECTION 2.5 GM/50ML
400.0000 mg/m2 | Freq: Once | INTRAVENOUS | Status: AC
  Administered 2019-04-09: 700 mg via INTRAVENOUS
  Filled 2019-04-09: qty 14

## 2019-04-09 MED ORDER — PALONOSETRON HCL INJECTION 0.25 MG/5ML
0.2500 mg | Freq: Once | INTRAVENOUS | Status: AC
  Administered 2019-04-09: 0.25 mg via INTRAVENOUS

## 2019-04-09 MED ORDER — FLUCONAZOLE 150 MG PO TABS: 150.0000 mg | ORAL_TABLET | Freq: Once | ORAL | 1 refills | Status: AC

## 2019-04-09 MED ORDER — SODIUM CHLORIDE 0.9 % IV SOLN
400.0000 mg/m2 | Freq: Once | INTRAVENOUS | Status: AC
  Administered 2019-04-09: 716 mg via INTRAVENOUS
  Filled 2019-04-09: qty 35.8

## 2019-04-09 MED ORDER — DEXAMETHASONE SODIUM PHOSPHATE 10 MG/ML IJ SOLN
10.0000 mg | Freq: Once | INTRAMUSCULAR | Status: AC
  Administered 2019-04-09: 10 mg via INTRAVENOUS

## 2019-04-09 MED ORDER — LACTATED RINGERS IV BOLUS: 1000.0000 mL | Freq: Once | INTRAVENOUS | Status: DC

## 2019-04-09 MED ORDER — ATROPINE SULFATE 1 MG/ML IJ SOLN
INTRAMUSCULAR | Status: AC
  Filled 2019-04-09: qty 1

## 2019-04-09 MED ORDER — ATROPINE SULFATE 1 MG/ML IJ SOLN
0.5000 mg | Freq: Once | INTRAMUSCULAR | Status: AC | PRN
  Administered 2019-04-09: 0.5 mg via INTRAVENOUS

## 2019-04-09 MED ORDER — PALONOSETRON HCL INJECTION 0.25 MG/5ML
INTRAVENOUS | Status: AC
  Filled 2019-04-09: qty 5

## 2019-04-09 MED ORDER — SODIUM CHLORIDE 0.9 % IV SOLN
180.0000 mg/m2 | Freq: Once | INTRAVENOUS | Status: AC
  Administered 2019-04-09: 320 mg via INTRAVENOUS
  Filled 2019-04-09: qty 15

## 2019-04-09 MED ORDER — DEXAMETHASONE SODIUM PHOSPHATE 10 MG/ML IJ SOLN
INTRAMUSCULAR | Status: AC
  Filled 2019-04-09: qty 1

## 2019-04-09 NOTE — ED Provider Notes (Signed)
Emergency Department Provider Note   I have reviewed the triage vital signs and the nursing notes.   HISTORY  Chief Complaint Seizures   HPI [name] is a 44 y.o. female with medical problems document below who presents to the emergency department today secondary to seizure like activity.  Patient states he has history of seizures but has been off medications for the last 7 years.  She is still confused and her husband gives the history that they were secondary eating and she stated she felt very very tired.  She continued to say that and if she had a far off stare and then she made a grunting type noise and clenched up.  She then started shaking for approximately 2 to 3 minutes.  Mental status started to improve around the time EMS got there but he states after talking to her on her phone she does not seem to be herself still.  No recent illnesses but does have history of colon cancer with with multiple complications currently on chemotherapy.   No other associated or modifying symptoms.    Past Medical History:  Diagnosis Date  . ADD (attention deficit disorder)   . Anemia, iron deficiency   . Anxiety   . Colon cancer ([place]) 08/2013  . Depression   . Difficulty swallowing pills   . GERD (gastroesophageal reflux disease)   . Headache    migraines "long time ago"  . History of blood transfusion 08/16/2013  . History of migraine    no problems in "a long time"  . 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 Restless leg syndrome   . Seizures ([place])    last seizure 08/2012    Patient Active Problem List   Diagnosis Date Noted  . Goals of care, counseling/discussion 02/23/2019  . 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 Ileus ([place])   . Metastatic malignant neoplasm ([place])   . Genetic testing 11/13/2015  . Sinusitis 01/29/2015  . Pelvic mass in female 12/28/2014  . Ovarian mass, right   . Hypokalemia 02/03/2014  . Nausea 12/20/2013  . Fever 12/18/2013  .  Dehydration 12/18/2013  . Diarrhea 10/28/2013  . Malfunction of device 10/28/2013  . Weight loss 10/28/2013  . Mucositis (ulcerative) due to antineoplastic therapy 10/28/2013  . Anemia due to antineoplastic chemotherapy 09/25/2013  . Chemotherapy induced thrombocytopenia 09/25/2013  . Hypomagnesemia 09/25/2013  . Hypophosphatemia 09/25/2013  . Adverse drug effect 09/25/2013  . Abdominal pain 09/24/2013  . Cancer of ascending colon s/p right colectomy 08/11/2013 08/05/2013  . Anemia, iron deficiency 01/28/2013  . 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[place]) 11/11/2012  . Discoloration of skin of face 09/03/2012  . Medication reaction 05/03/2012  . Migraine 03/31/2012  . Hyperglycemia 01/18/2012  . Fatigue 12/12/2011  . ADD (attention deficit disorder) 09/23/2010  . Right wrist pain 05/01/2010  . LIBIDO, DECREASED 04/07/2010  . IRON DEFICIENCY 05/17/2009  . ANA POSITIVE 05/17/2009  . OVARIAN CYST 05/10/2009  . FATTY LIVER DISEASE 03/24/2009  . TRANSAMINASES, SERUM, ELEVATED 03/21/2009  . Gastroparesis 08/16/2008  . DEPRESSION/ANXIETY 09/19/2006  . HYPERLIPIDEMIA 08/16/2006  . RESTLESS LEG SYNDROME 08/16/2006  . GERD 08/16/2006  . IBS 08/16/2006    Past Surgical History:  Procedure Laterality Date  . ABDOMINAL HYSTERECTOMY  08/24/2004   partial  . APPENDECTOMY  08/24/2004  . COLON SURGERY  08/11/2013   removed a foot of colon  . COLONOSCOPY  2019  . colonoscopy 10-18-14    . COLONOSCOPY WITH PROPOFOL  07/07/2013  .  CYSTOSCOPY  11/01/2003  . ENDOMETRIAL FULGURATION  11/01/2003  . ESOPHAGOGASTRODUODENOSCOPY  07/07/2013  . IR IMAGING GUIDED PORT INSERTION  03/05/2019  . LAPAROSCOPIC LYSIS OF ADHESIONS  11/01/2003  . LAPAROSCOPIC PARTIAL COLECTOMY Right 08/11/2013   Procedure: LAPAROSCOPIC RIGHT HEMICOLECTOMY;  Surgeon: Stark Klein, MD;  Location: [facility];  Service: General;  Laterality: Right;  . LAPAROTOMY N/A 12/28/2014   Procedure: EXPLORATORY LAPAROTOMY;  Surgeon: Everitt Amber, MD;   Location: [facility];  Service: Gynecology;  Laterality: N/A;  . LEFT OOPHORECTOMY  08/24/2004  . PANNICULECTOMY  08/24/2004  . PORT-A-CATH REMOVAL Left 06/08/2014   Procedure: REMOVAL PORT-A-CATH;  Surgeon: Stark Klein, MD;  Location: [facility];  Service: General;  Laterality: Left;  . PORTACATH PLACEMENT Left 09/09/2013   Procedure: INSERTION PORT-A-CATH;  Surgeon: Stark Klein, MD;  Location: [facility];  Service: General;  Laterality: Left;  . SALPINGOOPHORECTOMY Right 12/28/2014   Procedure: RIGHT SALPINGO OOPHORECTOMY;  Surgeon: Everitt Amber, MD;  Location: [facility];  Service: Gynecology;  Laterality: Right;  . TUBAL LIGATION  11/07/2000  . UNILATERAL SALPINGECTOMY Left 08/24/2004  . WISDOM TOOTH EXTRACTION Bilateral 1994    Current Outpatient Rx  . Order #: [id number] Class: Historical Med  . Order #: [id number] Class: Historical Med  . Order #: [id number] Class: Historical Med  . Order #: [id number] Class: Normal  . Order #: [id number] Class: Normal  . Order #: [id number] Class: Normal  . Order #: [id number] Class: Historical Med  . Order #: [id number] Class: Normal  . Order #: [id number] Class: Print  . Order #: [id number] Class: Historical Med  . Order #: [id number] Class: Normal    Allergies Adhesive [tape], Clindamycin/lincomycin, Promethazine, Eggs or egg-derived products, Metoclopramide hcl, Oxycodone-acetaminophen, Sulfa antibiotics, and Penicillins  Family History  Problem Relation Age of Onset  . Colon polyps Mother        3+ colon polyps at each colonoscopy - "several"  . Colitis Mother   . Heart attack Mother   . Diabetes Mother   . Heart disease Mother   . Diabetes Father   . Heart attack Father   . Stroke Father   . Congestive Heart Failure Father   . Heart disease Father   . Cirrhosis Maternal Aunt   . Lung cancer Maternal Uncle        d. 76s; smoker and worked around asbestos  . Other Paternal Grandfather        "bone cancer"  . Colon cancer Maternal Uncle        dx  early 38s  . Breast cancer Maternal Aunt        dx 50s-60s; s/p lumpectomy  . Dementia Maternal Aunt   . Other Cousin        maternal 1st cousin w/ "lung issues"  . Dementia Paternal Aunt   . Dementia Paternal Uncle   . Prostate cancer Paternal Uncle 21  . Esophageal cancer Neg Hx   . Rectal cancer Neg Hx   . Stomach cancer Neg Hx     Social History Social History   Tobacco Use  . Smoking status: Never Smoker  . Smokeless tobacco: Never Used  Substance Use Topics  . Alcohol use: No  . Drug use: No    Review of Systems  All other systems negative except as documented in the HPI. All pertinent positives and negatives as reviewed in the HPI. ____________________________________________   PHYSICAL EXAM:  VITAL SIGNS: ED Triage Vitals  Enc Vitals Group     BP 04/09/19 2212 134/87     Pulse  Rate 04/09/19 2212 79     Resp 04/09/19 2212 18     Temp 04/09/19 2209 98.6 F (37 C)     Temp Source 04/09/19 2209 Oral     SpO2 04/09/19 2212 100 %     Weight --      Height 04/09/19 2207 5\' 6"  (1.676 m)    Constitutional: Alert but disoriented. Well appearing and in no acute distress. Eyes: Conjunctivae are normal. PERRL. EOMI. Head: Atraumatic. Nose: No congestion/rhinnorhea. Mouth/Throat: Mucous membranes are moist.  Oropharynx non-erythematous. Wounds to bilateral sides of tongue. Neck: No stridor.  No meningeal signs.   Cardiovascular: Normal rate, regular rhythm. Good peripheral circulation. Grossly normal heart sounds.   Respiratory: Normal respiratory effort.  No retractions. Lungs CTAB. Gastrointestinal: Soft and nontender. No distention.  Musculoskeletal: No lower extremity tenderness nor edema. No gross deformities of extremities. Neurologic:  Normal speech and language. No gross focal neurologic deficits are appreciated.  Skin:  Skin is warm, dry and intact. No rash noted.  ____________________________________________   LABS (all labs ordered are listed, but  only abnormal results are displayed)  Labs Reviewed  URINE CULTURE  CBC WITH DIFFERENTIAL/PLATELET  COMPREHENSIVE METABOLIC PANEL  URINALYSIS, ROUTINE W REFLEX MICROSCOPIC   ____________________________________________  EKG   EKG Interpretation  Date/Time:    Ventricular Rate:    PR Interval:    QRS Duration:   QT Interval:    QTC Calculation:   R Axis:     Text Interpretation:         ____________________________________________  RADIOLOGY  No results found.  ____________________________________________   PROCEDURES  Procedure(s) performed:   .Critical Care Performed by: Merrily Pew, MD Authorized by: Merrily Pew, MD   Critical care provider statement:    Critical care time (minutes):  45   Critical care was necessary to treat or prevent imminent or life-threatening deterioration of the following conditions:  CNS failure or compromise   Critical care was time spent personally by me on the following activities:  Discussions with consultants, evaluation of patient's response to treatment, examination of patient, ordering and performing treatments and interventions, ordering and review of laboratory studies, ordering and review of radiographic studies, pulse oximetry, re-evaluation of patient's condition, obtaining history from patient or surrogate and review of old charts     ____________________________________________   INITIAL IMPRESSION / ASSESSMENT AND PLAN / ED COURSE  eval for metastatic disease vs provocation of seizures (infection, electrolytes, etc.).   Patient with another seizure in CT scanner witnessed by nursing. Ativan given. Still postictal on my arrival, bit her tongue, incontinent of urine. Will hold PO meds, start keppra load IV.   Improving mental status, still post ictal and likely ativan effect. Ct ok. Plan for observation in hospital with likely neuro fu in AM.   Pertinent labs & imaging results that were available during my care  of the patient were reviewed by me and considered in my medical decision making (see chart for details). ____________________________________________  FINAL CLINICAL IMPRESSION(S) / ED DIAGNOSES  Final diagnoses:  Seizure ([place])     MEDICATIONS GIVEN DURING THIS VISIT:  Medications  lactated ringers bolus 1,000 mL (has no administration in time range)  LORazepam (ATIVAN) injection 2 mg (has no administration in time range)  lamoTRIgine (LAMICTAL) tablet 100 mg (has no administration in time range)     NEW OUTPATIENT MEDICATIONS STARTED DURING THIS VISIT:  New Prescriptions   No medications on file    Note:  This note was  prepared with assistance of Systems analyst. Occasional wrong-word or sound-a-like substitutions may have occurred due to the inherent limitations of voice recognition software.   Saher Davee, Corene Cornea, MD 04/10/19 [phone]

## 2019-04-09 NOTE — ED Triage Notes (Signed)
Pt reports witnessed seizure at home by her husband.  Report from ems is no seizure history, has been receiving chemo treatment for colon CA.

## 2019-04-09 NOTE — Progress Notes (Addendum)
[place] OFFICE PROGRESS NOTE   Diagnosis: Colon cancer  INTERVAL HISTORY:   [name] returns as scheduled.  She completed cycle 2 FOLFIRI/Panitumumab on 03/26/2019.  Bolus 5-FU and irinotecan were held secondary to neutropenia.  She contacted the office on 04/03/2019 to report erythema at the location of prior stoma.  She was seen by surgery at [facility] on 04/03/2019.  There was concern for abscess development.  Drainage was attempted with 15 cc of bloody drainage evacuated.  She was started on a 10-day course of Levaquin.  She denies fever.  She intermittently notes a small amount of drainage from the abdominal wound.  No nausea or vomiting.  No mouth sores.  No diarrhea.  No neuropathy symptoms.  She has noted some small linear cracks in the skin on the fingertips.  She describes her appetite as "okay".  She is concerned she has another vaginal yeast infection related to the current course of antibiotics.  Objective:  Vital signs in last 24 hours:  Blood pressure 129/82, pulse 80, temperature 98.5 F (36.9 C), temperature source Temporal, resp. rate 16, weight 149 lb 3.2 oz (67.7 kg), SpO2 100 %.    HEENT: No thrush or ulcers. GI: Abdomen soft and nontender.  No hepatomegaly.  Approximate 1 cm open wound left abdomen.  No significant drainage or erythema noted. Vascular: No leg edema. Neuro: Alert and oriented. Skin: Multiple fingertips with small linear breaks in the skin.  Acne type skin rash chin, upper chest and upper back. Port-A-Cath without erythema.   Lab Results:  Lab Results  Component Value Date   WBC 3.8 (L) 04/09/2019   HGB 11.2 (L) 04/09/2019   HCT 36.2 04/09/2019   MCV 77.2 (L) 04/09/2019   PLT 173 04/09/2019   NEUTROABS 2.5 04/09/2019    Imaging:  No results found.  Medications: I have reviewed the patient's current medications.  Assessment/Plan: 1. 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  The tumor returned microsatellite stable with no loss of mismatch repair protein expression   APC mutated. No BRAF, KRAS, or NRAS mutation On Foundation 1 testing   Cycle 1 adjuvant FOLFOX 09/08/2013   Cycle 2 adjuvant FOLFOX 09/24/2013   Cycle 3 adjuvant FOLFOX 10/08/2013.   Cycle 4 adjuvant FOLFOX 10/22/2013.   Cycle 5 adjuvant FOLFOX 11/05/2013. Oxaliplatin held due to thrombocytopenia.  Cycle 6 FOLFOX 11/19/2013.  Cycle 7 FOLFOX 12/03/2013. Oxaliplatin held secondary to thrombocytopenia.  Cycle 8 FOLFOX 12/17/2013.  Cycle 9 FOLFOX 01/04/2014. Oxaliplatin held secondary to neutropenia.   Cycle 10 FOLFOX 01/21/2014. Oxaliplatin held secondary to thrombocytopenia.  Cycle 11 FOLFOX 02/04/2014  Cycle 12 FOLFOX 02/18/2014, oxaliplatin dose reduced secondary tothrombocytopenia  CT abdomen/pelvis 01/30/2014 revealed splenomegaly and no evidence of recurrent colon cancer  CT chest 04/07/2014 with a stable right lower lobe nodule and no evidence for metastatic disease, no nodules seen on the CT 11/26/2014  Markedly elevated CEA 11/24/2014  CT 11/26/2014 revealed a right pelvic mass, splenomegaly, small volume ascites  Right salpingo-oophorectomy 12/28/2014 with the pathology confirming metastatic colon cancer  CTs 03/23/2016-no evidence of recurrent or metastatic disease.  CT 11/26/2016-enlargement of a fluid density structure the right pelvic sidewall, no other evidence of metastatic disease  CT aspiration right pelvic cyst 12/19/2016.Cytology-BENIGN REACTIVE/REPARATIVE CHANGES.  CTs 06/05/2017-no evidence of metastatic disease, mild cirrhotic changes with splenomegaly  CTs 12/11/2017-recurrent cystic right adnexal mass, similar to on the CT 11/26/2016, stable mild splenomegaly  CTs 04/23/2018-enlargement of cystic right adnexal mass with mural nodularity, no other evidence of metastatic  disease  Cytoreductive surgery/HIPEC with mitomycin by Dr. Clovis Riley at [facility] 08/12/2018-R1 resection achieved. Cytoreduction included omentectomy, LAR, right salpingo-oophorectomy and left colonic gutter/pelvic stripping. Pathology on the rectum showed recurrent/metastatic adenocarcinoma, tumor 2.0 cm, predominantly involving the subserosa and muscularis propria of the colon, proximal and distal margins of resection were negative, vascular invasion present, metastatic carcinoma present in 1 out of 5 lymph nodes; omentum resection with no malignancy seen, no metastatic carcinoma identified in 1 lymph node examined; left gutter stripping positive metastatic adenocarcinoma; right ovary resection positive metastatic adenocarcinoma.  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  CEA 68 02/03/2019  Biopsy liver lesion 02/11/2019-metastatic adenocarcinoma consistent with primary colonic adenocarcinoma  CTs 02/27/2019-multiple liver lesions increased in size, new lesion in the lateral right lobe of the liver.  No evidence of metastatic disease in the chest.  Redemonstrated moderate left hydronephrosis and proximal hydroureter without discrete lesion or obstructing etiology at the transition point of the mid ureter.  Cycle 1 FOLFIRI/Panitumumab 03/12/2019  Cycle 2 FOLFIRI/Panitumumab 03/26/2019-bolus 5-FU and irinotecan held secondary to neutropenia  Cycle 3 FOLFIRI/Panitumumab 04/09/2019, Udenyca added 2. Mild elevation of the CEA beginning January 2016, normal on 05/19/2014   3. History of iron deficiency anemia  4. seizure disorder  5. history of depression  6. 4 mm right lower lobe nodule on a staging chest CT 09/08/2013 , stable on a CT 04/07/2014 7. Hospitalization 09/24/2013 through 09/26/2013 with fever and abdominal pain.  8. 09/24/2013 urine culture positive for coag negative staph.  9. 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 Splenomegaly noted on a CT scan 01/30/2014,persistent on repeat CTs 12. Colonoscopy 11/17/2018-flexible sigmoidoscopy per rectum with changes of mild diversion colitis. Scope advanced for approximately 25 cm. Most proximal portion had necrotic appearing debris. Colostomy bag insufflated suggesting some type of communication between the pouch and the proximal colon. Introduction of scope into the ostomy found to available directions. 1 toward the distal pouch with similar appearing necrotic debris encountered. The other was about a 30 cm segment of normal-appearing colonic mucosa to the level of the previous right hemicolectomy ileocolonic anastomosis. 33. Port-A-Cath placement interventional radiology 03/05/2019 14. Possible abdominal wall abscess status post evaluation by surgery at [facility] 04/03/2019, antibiotics initiated   Disposition: [name] appears stable.  She has completed 2 cycles of FOLFIRI/Panitumumab.  Irinotecan and bolus 5-FU were held with cycle 2 due to neutropenia.  Plan to proceed with cycle 3 FOLFIRI/Panitumumab today as scheduled.  She will receive Udenyca on the day of pump discontinuation.  We reviewed potential toxicities associated with Udenyca including bone pain, rash, splenic rupture.  She agrees to proceed.  We reviewed the CBC from today.  Counts are adequate to proceed as above, Udenyca on the day of pump discontinuation.  She has a mild skin rash and early paronychia related to Panitumumab.  She will continue supportive care.  The abdominal wound is open, appears clean.  She is completing a course of Levaquin.  She has follow-up at [facility] early next week.  Prescription sent to her pharmacy for Diflucan for treatment of a vaginal yeast infection.  She will return for lab, follow-up, FOLFIRI/Panitumumab in 2 weeks.  She will contact the office in the interim  with any problems.  Patient seen with Dr. Benay Spice.   Ned Card ANP/GNP-BC   04/09/2019  10:06 AM This was a shared visit with Ned Card.  [name] was interviewed and examined.  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  She is followed by the surgical service at [facility].  The plan is to continue FOLFIRI/Panitumumab.  Julieanne Manson, MD

## 2019-04-09 NOTE — ED Notes (Signed)
Cherlyn Roberts, RN aware of need for Lamictal from upstairs as this nurse is unable to pull med from ED Pyxis.

## 2019-04-09 NOTE — Patient Instructions (Signed)
[facility] Discharge Instructions for Patients Receiving Chemotherapy  Today you received the following chemotherapy agents Panitumumab (VECTIBIX), Irinotecan (CAMPTOSAR), Leucovorin & Flourouracil (ADRUCIL).  To help prevent nausea and vomiting after your treatment, we encourage you to take your nausea medication as prescribed.   If you develop nausea and vomiting that is not controlled by your nausea medication, call the clinic.   BELOW ARE SYMPTOMS THAT SHOULD BE REPORTED IMMEDIATELY:  *FEVER GREATER THAN 100.5 F  *CHILLS WITH OR WITHOUT FEVER  NAUSEA AND VOMITING THAT IS NOT CONTROLLED WITH YOUR NAUSEA MEDICATION  *UNUSUAL SHORTNESS OF BREATH  *UNUSUAL BRUISING OR BLEEDING  TENDERNESS IN MOUTH AND THROAT WITH OR WITHOUT PRESENCE OF ULCERS  *URINARY PROBLEMS  *BOWEL PROBLEMS  UNUSUAL RASH Items with * indicate a potential emergency and should be followed up as soon as possible.  Feel free to call the clinic should you have any questions or concerns. The clinic phone number is [phone].  Please show the [facility] at check-in to the Emergency Department and triage nurse.  Coronavirus (COVID-19) Are you at risk?  Are you at risk for the Coronavirus (COVID-19)?  To be considered HIGH RISK for Coronavirus (COVID-19), you have to meet the following criteria:  . Traveled to [place], [place], [place], [place] or [place]; or in the [place] to [place], [place], [place], or [place]; and have fever, cough, and shortness of breath within the last 2 weeks of travel OR . Been in close contact with a person diagnosed with COVID-19 within the last 2 weeks and have fever, cough, and shortness of breath . IF YOU DO NOT MEET THESE CRITERIA, YOU ARE CONSIDERED LOW RISK FOR COVID-19.  What to do if you are HIGH RISK for COVID-19?  Marland Kitchen If you are having a medical emergency, call 911. . Seek medical care right away. Before you go to a doctor's office,  urgent care or emergency department, call ahead and tell them about your recent travel, contact with someone diagnosed with COVID-19, and your symptoms. You should receive instructions from your physician's office regarding next steps of care.  . When you arrive at healthcare provider, tell the healthcare staff immediately you have returned from visiting [place], [place], [place], [place] or [place]; or traveled in the [place] to [place], [place], [place], or [place]; in the last two weeks or you have been in close contact with a person diagnosed with COVID-19 in the last 2 weeks.   . Tell the health care staff about your symptoms: fever, cough and shortness of breath. . After you have been seen by a medical provider, you will be either: o Tested for (COVID-19) and discharged home on quarantine except to seek medical care if symptoms worsen, and asked to  - Stay home and avoid contact with others until you get your results (4-5 days)  - Avoid travel on public transportation if possible (such as bus, train, or airplane) or o Sent to the Emergency Department by EMS for evaluation, COVID-19 testing, and possible admission depending on your condition and test results.  What to do if you are LOW RISK for COVID-19?  Reduce your risk of any infection by using the same precautions used for avoiding the common cold or flu:  Marland Kitchen Wash your hands often with soap and warm water for at least 20 seconds.  If soap and water are not readily available, use an alcohol-based hand sanitizer with at least 60%  alcohol.  . If coughing or sneezing, cover your mouth and nose by coughing or sneezing into the elbow areas of your shirt or coat, into a tissue or into your sleeve (not your hands). . Avoid shaking hands with others and consider head nods or verbal greetings only. . Avoid touching your eyes, nose, or mouth with unwashed hands.  . Avoid close contact with people who are sick. . Avoid places or events with  large numbers of people in one location, like concerts or sporting events. . Carefully consider travel plans you have or are making. . If you are planning any travel outside or inside the [place], visit the CDC's Travelers' Health webpage for the latest health notices. . If you have some symptoms but not all symptoms, continue to monitor at home and seek medical attention if your symptoms worsen. . If you are having a medical emergency, call 911.   [place] / e-Visit: [email or web address]         [facility]: [facility]: [id number]                   [facility]: [phone]

## 2019-04-10 ENCOUNTER — Other Ambulatory Visit: Payer: Self-pay

## 2019-04-10 ENCOUNTER — Telehealth: Payer: Self-pay | Admitting: *Deleted

## 2019-04-10 ENCOUNTER — Inpatient Hospital Stay (HOSPITAL_COMMUNITY)
Admit: 2019-04-10 | Discharge: 2019-04-10 | Disposition: A | Discharge status: Home / Self Care | Payer: BC Managed Care – PPO | Attending: Internal Medicine | Admitting: Internal Medicine

## 2019-04-10 ENCOUNTER — Encounter (HOSPITAL_COMMUNITY): Payer: Self-pay | Admitting: Family Medicine

## 2019-04-10 DIAGNOSIS — R569 Unspecified convulsions: Secondary | ICD-10-CM

## 2019-04-10 DIAGNOSIS — C189 Malignant neoplasm of colon, unspecified: Secondary | ICD-10-CM | POA: Diagnosis not present

## 2019-04-10 LAB — URINALYSIS, ROUTINE W REFLEX MICROSCOPIC
Bilirubin Urine: NEGATIVE
Glucose, UA: >=500 mg/dL — AB
Hgb urine dipstick: NEGATIVE
Ketones, ur: NEGATIVE mg/dL
Leukocytes,Ua: NEGATIVE
Nitrite: NEGATIVE
Protein, ur: 30 mg/dL — AB
Specific Gravity, Urine: 1.021 (ref 1.005–1.030)
pH: 6 (ref 5.0–8.0)

## 2019-04-10 LAB — COMPREHENSIVE METABOLIC PANEL
ALT: 12 U/L (ref 0–44)
ALT: 14 U/L (ref 0–44)
AST: 15 U/L (ref 15–41)
AST: 19 U/L (ref 15–41)
Albumin: 3.6 g/dL (ref 3.5–5.0)
Albumin: 3.9 g/dL (ref 3.5–5.0)
Alkaline Phosphatase: 80 U/L (ref 38–126)
Alkaline Phosphatase: 87 U/L (ref 38–126)
Anion gap: 7 (ref 5–15)
Anion gap: 8 (ref 5–15)
BUN: 11 mg/dL (ref 6–20)
BUN: 13 mg/dL (ref 6–20)
CO2: 24 mmol/L (ref 22–32)
CO2: 25 mmol/L (ref 22–32)
Calcium: 9.3 mg/dL (ref 8.9–10.3)
Calcium: 9.4 mg/dL (ref 8.9–10.3)
Chloride: 107 mmol/L (ref 98–111)
Chloride: 109 mmol/L (ref 98–111)
Creatinine, Ser: 0.55 mg/dL (ref 0.44–1.00)
Creatinine, Ser: 0.59 mg/dL (ref 0.44–1.00)
GFR calc Af Amer: >60 mL/min (ref >60)
GFR calc Af Amer: >60 mL/min (ref >60)
GFR calc non Af Amer: >60 mL/min (ref >60)
GFR calc non Af Amer: >60 mL/min (ref >60)
Glucose, Bld: 165 mg/dL — ABNORMAL HIGH (ref 70–99)
Glucose, Bld: 200 mg/dL — ABNORMAL HIGH (ref 70–99)
Potassium: 3.6 mmol/L (ref 3.5–5.1)
Potassium: 3.7 mmol/L (ref 3.5–5.1)
Sodium: 139 mmol/L (ref 135–145)
Sodium: 141 mmol/L (ref 135–145)
Total Bilirubin: 0.7 mg/dL (ref 0.3–1.2)
Total Bilirubin: 0.8 mg/dL (ref 0.3–1.2)
Total Protein: 6.7 g/dL (ref 6.5–8.1)
Total Protein: 7.3 g/dL (ref 6.5–8.1)

## 2019-04-10 LAB — CBC WITH DIFFERENTIAL/PLATELET
Abs Immature Granulocytes: 0.04 10*3/uL (ref 0.00–0.07)
Basophils Absolute: 0 10*3/uL (ref 0.0–0.1)
Basophils Relative: 0 %
Eosinophils Absolute: 0 10*3/uL (ref 0.0–0.5)
Eosinophils Relative: 0 %
HCT: 39.2 % (ref 36.0–46.0)
Hemoglobin: 12 g/dL (ref 12.0–15.0)
Immature Granulocytes: 1 %
Lymphocytes Relative: 8 %
Lymphs Abs: 0.4 10*3/uL — ABNORMAL LOW (ref 0.7–4.0)
MCH: 24.1 pg — ABNORMAL LOW (ref 26.0–34.0)
MCHC: 30.6 g/dL (ref 30.0–36.0)
MCV: 78.7 fL — ABNORMAL LOW (ref 80.0–100.0)
Monocytes Absolute: 0.1 10*3/uL (ref 0.1–1.0)
Monocytes Relative: 2 %
Neutro Abs: 4 10*3/uL (ref 1.7–7.7)
Neutrophils Relative %: 89 %
Platelets: 219 10*3/uL (ref 150–400)
RBC: 4.98 MIL/uL (ref 3.87–5.11)
RDW: 15.9 % — ABNORMAL HIGH (ref 11.5–15.5)
WBC: 4.5 10*3/uL (ref 4.0–10.5)
nRBC: 0 % (ref 0.0–0.2)

## 2019-04-10 LAB — CBC
HCT: 35.8 % — ABNORMAL LOW (ref 36.0–46.0)
Hemoglobin: 10.8 g/dL — ABNORMAL LOW (ref 12.0–15.0)
MCH: 23.4 pg — ABNORMAL LOW (ref 26.0–34.0)
MCHC: 30.2 g/dL (ref 30.0–36.0)
MCV: 77.5 fL — ABNORMAL LOW (ref 80.0–100.0)
Platelets: 201 10*3/uL (ref 150–400)
RBC: 4.62 MIL/uL (ref 3.87–5.11)
RDW: 15.7 % — ABNORMAL HIGH (ref 11.5–15.5)
WBC: 7.4 10*3/uL (ref 4.0–10.5)
nRBC: 0 % (ref 0.0–0.2)

## 2019-04-10 LAB — PREGNANCY, URINE: Preg Test, Ur: NEGATIVE

## 2019-04-10 LAB — RESPIRATORY PANEL BY RT PCR (FLU A&B, COVID)
Influenza A by PCR: NEGATIVE
Influenza B by PCR: NEGATIVE
SARS Coronavirus 2 by RT PCR: NEGATIVE

## 2019-04-10 LAB — MRSA PCR SCREENING: MRSA by PCR: NEGATIVE

## 2019-04-10 MED ORDER — ACETAMINOPHEN 650 MG RE SUPP: 650.0000 mg | Freq: Four times a day (QID) | RECTAL | Status: DC | PRN

## 2019-04-10 MED ORDER — POLYETHYLENE GLYCOL 3350 17 G PO PACK: 17.0000 g | PACK | Freq: Every day | ORAL | 0 refills | Status: DC

## 2019-04-10 MED ORDER — ACETAMINOPHEN 325 MG PO TABS: 650.0000 mg | ORAL_TABLET | Freq: Four times a day (QID) | ORAL | Status: DC | PRN

## 2019-04-10 MED ORDER — ONDANSETRON HCL 4 MG PO TABS: 4.0000 mg | ORAL_TABLET | Freq: Four times a day (QID) | ORAL | Status: DC | PRN

## 2019-04-10 MED ORDER — LORAZEPAM 2 MG/ML IJ SOLN: 2.0000 mg | INTRAMUSCULAR | Status: DC | PRN

## 2019-04-10 MED ORDER — POLYETHYLENE GLYCOL 3350 17 G PO PACK: 17.0000 g | PACK | Freq: Every day | ORAL | Status: DC

## 2019-04-10 MED ORDER — LEVETIRACETAM 500 MG PO TABS: 500.0000 mg | ORAL_TABLET | Freq: Two times a day (BID) | ORAL | 1 refills | Status: DC

## 2019-04-10 MED ORDER — LAMOTRIGINE 25 MG PO TABS
ORAL_TABLET | ORAL | Status: AC
  Filled 2019-04-10: qty 4

## 2019-04-10 MED ORDER — LEVOFLOXACIN 750 MG PO TABS
750.0000 mg | ORAL_TABLET | Freq: Every day | ORAL | Status: DC
  Administered 2019-04-10: 750 mg via ORAL
  Filled 2019-04-10: qty 1

## 2019-04-10 MED ORDER — SODIUM CHLORIDE 0.9 % IV SOLN
20.0000 mg/kg | Freq: Once | INTRAVENOUS | Status: AC
  Administered 2019-04-10: 1350 mg via INTRAVENOUS
  Filled 2019-04-10: qty 10

## 2019-04-10 MED ORDER — SODIUM CHLORIDE 0.9 % IV SOLN: INTRAVENOUS | Status: DC

## 2019-04-10 MED ORDER — LORAZEPAM 2 MG/ML IJ SOLN
2.0000 mg | Freq: Once | INTRAMUSCULAR | Status: AC | PRN
  Administered 2019-04-10: 2 mg via INTRAVENOUS

## 2019-04-10 MED ORDER — ALUM & MAG HYDROXIDE-SIMETH 200-200-20 MG/5ML PO SUSP: 30.0000 mL | Freq: Four times a day (QID) | ORAL | Status: DC | PRN

## 2019-04-10 MED ORDER — CHLORHEXIDINE GLUCONATE CLOTH 2 % EX PADS
6.0000 | MEDICATED_PAD | Freq: Every day | CUTANEOUS | Status: DC
  Administered 2019-04-10: 6 via TOPICAL

## 2019-04-10 MED ORDER — LEVETIRACETAM IN NACL 500 MG/100ML IV SOLN
500.0000 mg | Freq: Two times a day (BID) | INTRAVENOUS | Status: DC
  Administered 2019-04-10: 500 mg via INTRAVENOUS
  Filled 2019-04-10: qty 100

## 2019-04-10 MED ORDER — LEVETIRACETAM 500 MG/5ML IV SOLN
INTRAVENOUS | Status: AC
  Filled 2019-04-10: qty 15

## 2019-04-10 MED ORDER — ONDANSETRON HCL 4 MG/2ML IJ SOLN: 4.0000 mg | Freq: Four times a day (QID) | INTRAMUSCULAR | Status: DC | PRN

## 2019-04-10 NOTE — Progress Notes (Signed)
Assisted tele visit to patient with mother.  [name] Random Dobrowski, RN   

## 2019-04-10 NOTE — ED Notes (Addendum)
[name], in [place] called charge nurse [name], RN to CT- pt having seizure like activity. Upon this nurse entering CT- pt was not actively seizing, however, pt was post-ictal, incontinent of urine, pt bit tongue causing bleeding (controlled at this time.) Pt is confused, restless, and swinging arms/legs, trying to pull off all monitor cords and IV tubing. Ativan brought to CT by this nurse and administered- DR Mesner made aware-order received for another 2 mg Ativan IV PRN dose if needed. This nurse pulled another vial and returned to CT to assist [name] with holding pt for safety and to obtain CT head.

## 2019-04-10 NOTE — ED Notes (Signed)
Pt presents to ED with port accessed and IV infusion running. Pt says she was seen by oncology today and they started her chemo infusion- however upon site assessment, this nurse noted that all tubing and extensions were clamped off- portable pump, tubing, and IV bag (with chemo in it) are in black bag carry case that pt arrived with. This nurse did not adjust anything.

## 2019-04-10 NOTE — Procedures (Signed)
  Emily A. Merlene Laughter, MD     [email or web address]           HISTORY: This is a 44 year old female who presents with seizures. There is a baseline history of seizures.  MEDICATIONS: No current facility-administered medications for this encounter.  Current Outpatient Medications:  .  fluconazole (DIFLUCAN) 150 MG tablet, Take 1 tablet (150 mg total) by mouth once for 1 dose. Repeat dose in 1 week if symptoms better, but not resolved., Disp: 1 tablet, Rfl: 1  Facility-Administered Medications Ordered in Other Encounters:  .  0.9 %  sodium chloride infusion, , Intravenous, Continuous, Oswald Hillock, MD, Last Rate: 75 mL/hr at 04/10/19 0436, New Bag at 04/10/19 0436 .  acetaminophen (TYLENOL) tablet 650 mg, 650 mg, Oral, Q6H PRN **OR** acetaminophen (TYLENOL) suppository 650 mg, 650 mg, Rectal, Q6H PRN, Darrick Meigs, Marge Duncans, MD .  alum & mag hydroxide-simeth (MAALOX/MYLANTA) 200-200-20 MG/5ML suspension 30 mL, 30 mL, Oral, Q6H PRN, Darrick Meigs, Marge Duncans, MD .  Chlorhexidine Gluconate Cloth 2 % PADS 6 each, 6 each, Topical, Q0600, Barton Dubois, MD, 6 each at 04/10/19 0500 .  lactated ringers bolus 1,000 mL, 1,000 mL, Intravenous, Once, Mesner, Corene Cornea, MD, Stopped at 04/09/19 2355 .  levETIRAcetam (KEPPRA) IVPB 500 mg/100 mL premix, 500 mg, Intravenous, Q12H, Darrick Meigs, Marge Duncans, MD, Last Rate: 400 mL/hr at 04/10/19 1008, 500 mg at 04/10/19 1008 .  levofloxacin (LEVAQUIN) tablet 750 mg, 750 mg, Oral, Daily, Darrick Meigs, Marge Duncans, MD, 750 mg at 04/10/19 1213 .  LORazepam (ATIVAN) injection 2 mg, 2 mg, Intravenous, Q4H PRN, Darrick Meigs, Marge Duncans, MD .  ondansetron (ZOFRAN) tablet 4 mg, 4 mg, Oral, Q6H PRN **OR** ondansetron (ZOFRAN) injection 4 mg, 4 mg, Intravenous, Q6H PRN, Darrick Meigs, Gagan S, MD .  polyethylene glycol (MIRALAX / GLYCOLAX) packet 17 g, 17 g, Oral, Daily, Lama, Marge Duncans, MD     ANALYSIS: A 16 channel recording using standard 10 20 measurements is conducted for 24 minutes.  There is a well-formed  posterior dominant rhythm of 9 hertz which attenuates with eye-opening. There is beta activity observed in the frontal areas. The recording transitions to theta slowing followed by spindles and K complexes. Photic stimulation and hyperventilation are not conducted. There are a couple episodes of generalized spike slow-wave activity/polyspike between 2-3 hertz.  However, no evidence of ongoing electrographic seizures are noted. There is no focal or lateral slowing.   IMPRESSION: 1. 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 There is no evidence of ongoing electrographic seizures however.      Gwendloyn Forsee A. Merlene [name], M.D.  Diplomate, Tax adviser of Psychiatry and Neurology ( Neurology).

## 2019-04-10 NOTE — Discharge Summary (Signed)
Physician Discharge Summary  [name] [id number] DOB: 30-Oct-1975 DOA: 04/09/2019  PCP: Patient, No Pcp Per  Admit date: 04/09/2019 Discharge date: 04/10/2019  Time spent: 35 minutes  Recommendations for Outpatient Follow-up:  1. Repeat basic metabolic panel to follow lites and renal function 2. Make sure patient has follow-up with neurology service as instructed to further determine adjustment in her antiepileptic management.   Discharge Diagnoses:  Active Problems:   Seizure ([place]) Metastatic colon cancer Abdominal fistula/abscess Anxiety  Discharge Condition: Stable and improved.  Discharged home with instruction to follow-up with PCP in 10 days; follow-up with neurology in 4 weeks and general surgery/oncology as previously scheduled.  CODE STATUS: Full code.  Diet recommendation: Regular diet.  Filed Weights   04/10/19 0500  Weight: 67.3 kg    History of present illness:  As per H&P written by Dr. Darrick Meigs on 04/10/2019 44 y.o. 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  Today patient presented to the ED with complaints of seizure-like activity.  Patient has history of seizures but has not been taking medications for last 7 years.  Patient is still somnolent and is unable to provide good history.  As per ED notes patient and her husband were eating and she stated that she felt very tired.  She continued to say that and had a blank stare with grunting type noise and clenched up.  She started  shaking for 2 to 3 minutes.  Her mental status started to improve around the time EMS arrived.  Patient was brought to the hospital.  In the ED patient had another seizure, she was given loading dose  of Keppra.   CT head showed no acute abnormality She denies chest pain or shortness of breath. Denies abdominal pain or dysuria. Denies nausea vomiting or diarrhea.  Hospital Course:  -Seizure -Patient with prior history of seizure disorder and reports of being seizure-free for over in 7 years without any antiepileptic drug on board. -Recently started on fluoroquinolones as part of treatment for an abscess in her abdomen p.o. colostomy reversion and residual fistula. -Most likely ended experiencing decrease in her seizure threshold by the use of Levaquin. -EEG demonstrated generalized couple episodes of epileptiform activity and after discussing with the neurology service decision has been made to start patient on Keppra 500 mg twice a day and follow her up in 1 month as an outpatient. -No further seizure activity appreciated since her admission. -Patient oriented x4, stable, tolerating diet and medications by mouth. -Will discharge home with instruction to follow-up with neurology and her PCP.  2-metastatic colon cancer -Continue outpatient follow-up with oncology service -Patient actively receiving chemotherapy.  3-abdominal fistula -Continue outpatient follow-up by general surgery at [facility] -Recent abscess properly treated with antibiotic therapy completed -There is no signs of remanent infection at this moment. -Continue to follow wound care instructions.  4-anxiety: -A stable mood and -Continue as needed anxiolytics.  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  Consultations:  Neurology service was curbside (Dr. Merlene Laughter).  Discharge Exam: Vitals:   04/10/19 1400 04/10/19 1600  BP: 108/79 117/86  Pulse: 73 78  Resp: 20 (!) 21  Temp:  98.1  F (36.7 C)  SpO2: 100% 100%    General: Afebrile, no chest pain, no nausea, no  vomiting; oriented x4 and in no acute distress.  No further seizure activity appreciated. Cardiovascular: S1 and S2, no rubs, no gallops, no JVD. Respiratory: Clear to auscultation bilaterally; normal respiratory effort.  No using accessory muscles and with good O2 sat on room air. Abdomen: Soft, nontender, positive bowel sounds; open fistula appreciated from recent colostomy compression; no erythematous changes or active drainage appreciated.  There is no induration or tenderness on palpation. Extremities: No cyanosis, no clubbing.  Discharge Instructions   Discharge Instructions    Discharge instructions   Complete by: As directed    Keep yourself well hydrated Take medications as prescribed Follow-up with neurologist (Dr. Merlene Laughter) in 4 weeks Follow-up with PCP in 10 days Continue outpatient follow-up with oncology service as previously scheduled. Continue outpatient follow-up with general surgery as previously scheduled.     Allergies as of 04/10/2019      Reactions   Adhesive [tape] Hives, Shortness Of Breath, Swelling   Tolerates paper tape   Clindamycin/lincomycin Itching, Rash   Generalized body rash with itching, lip ulcers   Promethazine Other (See Comments)   Increase restless leg movement    Eggs Or Egg-derived Products Nausea And Vomiting   Metoclopramide Hcl Other (See Comments)   EXACERBATES RESTLESS LEG SYNDROME   Oxycodone-acetaminophen Hives   Sulfa Antibiotics Nausea And Vomiting   Levaquin [levofloxacin]    seizures   Penicillins Itching   Has patient had a PCN reaction causing immediate rash, facial/tongue/throat swelling, SOB or lightheadedness with hypotension: No Has patient had a PCN reaction causing severe rash involving mucus membranes or skin necrosis: No Has patient had a PCN reaction that required hospitalization No Has patient had a PCN reaction occurring within the last 10 years: No If all of the above answers are "NO", then may proceed with  Cephalosporin use.      Medication List    STOP taking these medications   doxycycline 100 MG tablet Commonly known as: VIBRA-TABS     TAKE these medications   acetaminophen 500 MG tablet Commonly known as: TYLENOL Take 1,000 mg by mouth every 6 (six) hours as needed.   alum & mag hydroxide-simeth 200-200-20 MG/5ML suspension Commonly known as: MAALOX/MYLANTA Take 30 mLs by mouth every 6 (six) hours as needed for indigestion or heartburn.   diphenhydrAMINE 25 MG tablet Commonly known as: BENADRYL Take 25 mg by mouth every 6 (six) hours as needed for itching or allergies (Patient takes with oxycodone to prevent reaction).   fluconazole 150 MG tablet Commonly known as: Diflucan Take 1 tablet (150 mg total) by mouth once for 1 dose. Repeat dose in 1 week if symptoms better, but not resolved.   fluticasone 0.05 % cream Commonly known as: CUTIVATE Apply topically 2 (two) times daily. To rash   levETIRAcetam 500 MG tablet Commonly known as: Keppra Take 1 tablet (500 mg total) by mouth 2 (two) times daily.   lidocaine-prilocaine cream Commonly known as: EMLA Apply to port site 1-2 hours prior to use   LORazepam 0.5 MG tablet Commonly known as: ATIVAN Take 1 tablet (0.5 mg total) by mouth 2 (two) times daily as needed for anxiety.   polyethylene glycol 17 g packet Commonly known as: MIRALAX / GLYCOLAX Take 17 g by mouth daily.   prochlorperazine 10 MG tablet Commonly known as: COMPAZINE Take 1 tablet (10 mg total) by mouth every 6 (six)  hours as needed for nausea or vomiting.      Allergies  Allergen Reactions  . Adhesive [Tape] Hives, Shortness Of Breath and Swelling    Tolerates paper tape  . Clindamycin/Lincomycin Itching and Rash    Generalized body rash with itching, lip ulcers  . Promethazine Other (See Comments)    Increase restless leg movement   . Eggs Or Egg-Derived Products Nausea And Vomiting  . Metoclopramide Hcl Other (See Comments)    EXACERBATES  RESTLESS LEG SYNDROME  . Oxycodone-Acetaminophen Hives  . Sulfa Antibiotics Nausea And Vomiting  . Levaquin [Levofloxacin]     seizures  . Penicillins Itching    Has patient had a PCN reaction causing immediate rash, facial/tongue/throat swelling, SOB or lightheadedness with hypotension: No Has patient had a PCN reaction causing severe rash involving mucus membranes or skin necrosis: No Has patient had a PCN reaction that required hospitalization No Has patient had a PCN reaction occurring within the last 10 years: No If all of the above answers are "NO", then may proceed with Cephalosporin use.    Follow-up Information    Phillips Odor, MD. Schedule an appointment as soon as possible for a visit in 4 week(s).   Specialty: Neurology Contact information: [address] [place] [address] [phone]           The results of significant diagnostics from this hospitalization (including imaging, microbiology, ancillary and laboratory) are listed below for reference.    Significant Diagnostic Studies: CT Head Wo Contrast  Result Date: 04/10/2019 CLINICAL DATA:  Metastatic colon cancer, seizure EXAM: CT HEAD WITHOUT CONTRAST TECHNIQUE: Contiguous axial images were obtained from the base of the skull through the vertex without intravenous contrast. COMPARISON:  None. FINDINGS: Brain: No evidence of acute territorial infarction, hemorrhage, hydrocephalus,extra-axial collection or mass lesion/mass effect. Normal gray-white differentiation. Ventricles are normal in size and contour. Vascular: No hyperdense vessel or unexpected calcification. Skull: The skull is intact. No fracture or focal lesion identified. Sinuses/Orbits: The visualized paranasal sinuses and mastoid air cells are clear. The orbits and globes intact. Other: None IMPRESSION: No acute intracranial abnormality. Electronically Signed   By: Prudencio Pair M.D.   On: 04/10/2019 00:50   EEG adult  Result Date:  04/10/2019 Phillips Odor, MD     04/10/2019  4:12 PM Bostic A. Merlene Laughter, MD     [email or web address]       HISTORY: This is a 44 year old female who presents with seizures. There is a baseline history of seizures. MEDICATIONS: No current facility-administered medications for this encounter. Current Outpatient Medications: .  fluconazole (DIFLUCAN) 150 MG tablet, Take 1 tablet (150 mg total) by mouth once for 1 dose. Repeat dose in 1 week if symptoms better, but not resolved., Disp: 1 tablet, Rfl: 1 Facility-Administered Medications Ordered in Other Encounters: .  0.9 %  sodium chloride infusion, , Intravenous, Continuous, Oswald Hillock, MD, Last Rate: 75 mL/hr at 04/10/19 0436, New Bag at 04/10/19 0436 .  acetaminophen (TYLENOL) tablet 650 mg, 650 mg, Oral, Q6H PRN **OR** acetaminophen (TYLENOL) suppository 650 mg, 650 mg, Rectal, Q6H PRN, Darrick Meigs, Marge Duncans, MD .  alum & mag hydroxide-simeth (MAALOX/MYLANTA) 200-200-20 MG/5ML suspension 30 mL, 30 mL, Oral, Q6H PRN, Darrick Meigs, Marge Duncans, MD .  Chlorhexidine Gluconate Cloth 2 % PADS 6 each, 6 each, Topical, Q0600, Barton Dubois, MD, 6 each at 04/10/19 0500 .  lactated ringers bolus 1,000 mL, 1,000 mL, Intravenous, Once, Mesner, Corene Cornea, MD, Stopped at 04/09/19 2355 .  levETIRAcetam (KEPPRA) IVPB 500 mg/100 mL premix, 500 mg, Intravenous, Q12H, Darrick Meigs, Marge Duncans, MD, Last Rate: 400 mL/hr at 04/10/19 1008, 500 mg at 04/10/19 1008 .  levofloxacin (LEVAQUIN) tablet 750 mg, 750 mg, Oral, Daily, Darrick Meigs, Marge Duncans, MD, 750 mg at 04/10/19 1213 .  LORazepam (ATIVAN) injection 2 mg, 2 mg, Intravenous, Q4H PRN, Darrick Meigs, Marge Duncans, MD .  ondansetron (ZOFRAN) tablet 4 mg, 4 mg, Oral, Q6H PRN **OR** ondansetron (ZOFRAN) injection 4 mg, 4 mg, Intravenous, Q6H PRN, Darrick Meigs, Gagan S, MD .  polyethylene glycol (MIRALAX / GLYCOLAX) packet 17 g, 17 g, Oral, Daily, Lama, Marge Duncans, MD ANALYSIS: A 16 channel recording using standard 10 20 measurements is conducted for 24 minutes.  There is a  well-formed posterior dominant rhythm of 9 hertz which attenuates with eye-opening. There is beta activity observed in the frontal areas. The recording transitions to theta slowing followed by spindles and K complexes. Photic stimulation and hyperventilation are not conducted. There are a couple episodes of generalized spike slow-wave activity/polyspike between 2-3 hertz.  However, no evidence of ongoing electrographic seizures are noted. There is no focal or lateral slowing. IMPRESSION: 1. 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 There is no evidence of ongoing electrographic seizures however. Kofi A. Merlene Laughter, M.D. Diplomate, Tax adviser of Psychiatry and Neurology ( Neurology).    Microbiology: Recent Results (from the past 240 hour(s))  Respiratory Panel by RT PCR (Flu A&B, Covid) - Nasopharyngeal Swab     Status: None   Collection Time: 04/10/19  2:41 AM   Specimen: Nasopharyngeal Swab  Result Value Ref Range Status   SARS Coronavirus 2 by RT PCR NEGATIVE NEGATIVE Final    Comment: (NOTE) SARS-CoV-2 target nucleic acids are NOT DETECTED. The SARS-CoV-2 RNA is generally detectable in upper respiratoy specimens during the acute phase of infection. The lowest concentration of SARS-CoV-2 viral copies this assay can detect is 131 copies/mL. A negative result does not preclude SARS-Cov-2 infection and should not be used as the sole basis for treatment or other patient management decisions. A negative result may occur with  improper specimen collection/handling, submission of specimen other than nasopharyngeal swab, presence of viral mutation(s) within the areas targeted by this assay, and inadequate number of viral copies (<131 copies/mL). A negative result must be combined with clinical observations, patient history, and epidemiological information. The expected result is Negative. Fact Sheet for  Patients:  PinkCheek.be Fact Sheet for Healthcare Providers:  GravelBags.it This test is not yet ap proved or cleared by the [place] FDA and  has been authorized for detection and/or diagnosis of SARS-CoV-2 by FDA under an Emergency Use Authorization (EUA). This EUA will remain  in effect (meaning this test can be used) for the duration of the COVID-19 declaration under Section 564(b)(1) of the Act, 21 U.S.C. section 360bbb-3(b)(1), unless the authorization is terminated or revoked sooner.    Influenza A by PCR NEGATIVE NEGATIVE Final   Influenza B by PCR NEGATIVE NEGATIVE Final    Comment: (NOTE) The Xpert Xpress SARS-CoV-2/FLU/RSV assay is intended as an aid in  the diagnosis of influenza from Nasopharyngeal swab specimens and  should not be used as a sole basis for treatment. Nasal washings and  aspirates are unacceptable for Xpert Xpress SARS-CoV-2/FLU/RSV  testing. Fact Sheet for Patients: PinkCheek.be Fact Sheet for Healthcare Providers: GravelBags.it This test is not yet approved or cleared by the [place] FDA and  has been  authorized for detection and/or diagnosis of SARS-CoV-2 by  FDA under an Emergency Use Authorization (EUA). This EUA will remain  in effect (meaning this test can be used) for the duration of the  Covid-19 declaration under Section 564(b)(1) of the Act, 21  U.S.C. section 360bbb-3(b)(1), unless the authorization is  terminated or revoked. Performed at [facility], [address] [place], [place] [address]   MRSA PCR Screening     Status: None   Collection Time: 04/10/19  4:08 AM   Specimen: Nasal Mucosa; Nasopharyngeal  Result Value Ref Range Status   MRSA by PCR NEGATIVE NEGATIVE Final    Comment:        The GeneXpert MRSA Assay (FDA approved for NASAL specimens only), is one component of a comprehensive MRSA  colonization surveillance program. It is not intended to diagnose MRSA infection nor to guide or monitor treatment for MRSA infections. Performed at [facility], [address], [place], [place] [address]      Labs: Basic Metabolic Panel: Recent Labs  Lab 04/09/19 0927 04/09/19 2347 04/10/19 0455  NA 143 139 141  K 3.7 3.7 3.6  CL 107 107 109  CO2 26 24 25   GLUCOSE 180* 200* 165*  BUN 8 11 13   CREATININE 0.74 0.59 0.55  CALCIUM 9.0 9.4 9.3   Liver Function Tests: Recent Labs  Lab 04/09/19 0927 04/09/19 2347 04/10/19 0455  AST 14* 19 15  ALT 10 14 12   ALKPHOS 96 87 80  BILITOT 0.4 0.7 0.8  PROT 6.8 7.3 6.7  ALBUMIN 3.7 3.9 3.6   CBC: Recent Labs  Lab 04/09/19 0927 04/09/19 2347 04/10/19 0455  WBC 3.8* 4.5 7.4  NEUTROABS 2.5 4.0  --   HGB 11.2* 12.0 10.8*  HCT 36.2 39.2 35.8*  MCV 77.2* 78.7* 77.5*  PLT 173 219 201    Signed:  Barton Dubois MD.  [facility] 04/10/2019, 4:42 PM

## 2019-04-10 NOTE — ED Notes (Addendum)
IV tubing disconnected (as all tubing was clamped upon pt arrival to ED)- pump stopped and turned off- with IV bag and tubing still connected. Display screen on [employer] pump reads: RUN  ResVol 110.32ml Remaining medication IVPB and tubing placed in black fanny pack, pt label placed.

## 2019-04-10 NOTE — H&P (Signed)
TRH H&P    Patient Demographics:    [name], is a 44 y.o. female  MRN: [id number]  DOB - [birth year]  Admit Date - 04/09/2019  Referring MD/NP/PA: Dorise Bullion  Outpatient Primary MD for the patient is Patient, No Pcp Per  Patient coming from: Home  Chief complaint-seizure   HPI:    [name]  is a 44 y.o. 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  Today patient presented to the ED with complaints of seizure-like activity.  Patient has history of seizures but has not been taking medications for last 7 years.  Patient is still somnolent and is unable to provide good history.  As per ED notes patient and her husband were eating and she stated that she felt very tired.  She continued to say that and had a blank stare with grunting type noise and clenched up.  She started  shaking for 2 to 3 minutes.  Her mental status started to improve around the time EMS arrived.  Patient was brought to the hospital.  In the ED patient had another seizure, she was given loading dose of Keppra.   CT head showed no acute abnormality She denies chest pain or shortness of breath. Denies abdominal pain or dysuria. Denies nausea vomiting or diarrhea.   Review of systems:    In addition to the HPI above,    All other systems reviewed and are negative.    Past History of the following :    Past Medical History:  Diagnosis Date  . ADD (attention deficit disorder)   . Anemia, iron deficiency   . Anxiety   . Colon cancer ([place]) 08/2013  . Depression   . Difficulty swallowing pills   . GERD (gastroesophageal reflux disease)    . Headache    migraines "long time ago"  . History of blood transfusion 08/16/2013  . History of migraine    no problems in "a long time"  . 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 Restless leg syndrome   . Seizures ([place])    last seizure 08/2012      Past Surgical History:  Procedure Laterality Date  . ABDOMINAL HYSTERECTOMY  08/24/2004   partial  . APPENDECTOMY  08/24/2004  . COLON SURGERY  08/11/2013   removed a foot of colon  . COLONOSCOPY  2019  . colonoscopy 10-18-14    . COLONOSCOPY WITH PROPOFOL  07/07/2013  . CYSTOSCOPY  11/01/2003  . ENDOMETRIAL FULGURATION  11/01/2003  . ESOPHAGOGASTRODUODENOSCOPY  07/07/2013  . IR IMAGING GUIDED PORT INSERTION  03/05/2019  . LAPAROSCOPIC LYSIS OF ADHESIONS  11/01/2003  . LAPAROSCOPIC PARTIAL COLECTOMY Right 08/11/2013   Procedure: LAPAROSCOPIC RIGHT HEMICOLECTOMY;  Surgeon: Stark Klein, MD;  Location: [place];  Service: General;  Laterality: Right;  . LAPAROTOMY N/A 12/28/2014  Procedure: EXPLORATORY LAPAROTOMY;  Surgeon: Everitt Amber, MD;  Location: [facility];  Service: Gynecology;  Laterality: N/A;  . LEFT OOPHORECTOMY  08/24/2004  . PANNICULECTOMY  08/24/2004  . PORT-A-CATH REMOVAL Left 06/08/2014   Procedure: REMOVAL PORT-A-CATH;  Surgeon: Stark Klein, MD;  Location: [facility];  Service: General;  Laterality: Left;  . PORTACATH PLACEMENT Left 09/09/2013   Procedure: INSERTION PORT-A-CATH;  Surgeon: Stark Klein, MD;  Location: [facility];  Service: General;  Laterality: Left;  . SALPINGOOPHORECTOMY Right 12/28/2014   Procedure: RIGHT SALPINGO OOPHORECTOMY;  Surgeon: Everitt Amber, MD;  Location: [facility];  Service: Gynecology;  Laterality: Right;  . TUBAL LIGATION  11/07/2000  . UNILATERAL SALPINGECTOMY Left 08/24/2004  . WISDOM TOOTH EXTRACTION Bilateral 1994      Social History:      Social History   Tobacco Use  . Smoking status: Never Smoker  . Smokeless tobacco: Never Used  Substance Use Topics  . Alcohol use: No        Family History :     Family History  Problem Relation Age of Onset  . Colon polyps Mother        3+ colon polyps at each colonoscopy - "several"  . Colitis Mother   . Heart attack Mother   . Diabetes Mother   . Heart disease Mother   . Diabetes Father   . Heart attack Father   . Stroke Father   . Congestive Heart Failure Father   . Heart disease Father   . Cirrhosis Maternal Aunt   . Lung cancer Maternal Uncle        d. 40s; smoker and worked around asbestos  . Other Paternal Grandfather        "bone cancer"  . Colon cancer Maternal Uncle        dx early 14s  . Breast cancer Maternal Aunt        dx 50s-60s; s/p lumpectomy  . Dementia Maternal Aunt   . Other Cousin        maternal 1st cousin w/ "lung issues"  . Dementia Paternal Aunt   . Dementia Paternal Uncle   . Prostate cancer Paternal Uncle 72  . Esophageal cancer Neg Hx   . Rectal cancer Neg Hx   . Stomach cancer Neg Hx       Home Medications:   Prior to Admission medications   Medication Sig Start Date End Date Taking? Authorizing Provider  acetaminophen (TYLENOL) 500 MG tablet Take 1,000 mg by mouth every 6 (six) hours as needed.    [provider]  alum & mag hydroxide-simeth (MAALOX/MYLANTA) 200-200-20 MG/5ML suspension Take 30 mLs by mouth every 6 (six) hours as needed for indigestion or heartburn.    [provider]  diphenhydrAMINE (BENADRYL) 25 MG tablet Take 25 mg by mouth every 6 (six) hours as needed for itching or allergies (Patient takes with oxycodone to prevent reaction).    [provider]  doxycycline (VIBRA-TABS) 100 MG tablet Take 1 tablet (100 mg total) by mouth 2 (two) times daily. 02/23/19   Owens Shark, NP  fluticasone (CUTIVATE) 0.05 % cream Apply topically 2 (two) times daily. To rash 03/26/19   [name], MD  levofloxacin (LEVAQUIN) 750 MG tablet Take 750 mg by mouth daily. 04/03/19   [provider]  lidocaine-prilocaine (EMLA) cream  Apply to port site 1-2 hours prior to use 02/23/19   Owens Shark, NP  LORazepam (ATIVAN) 0.5 MG tablet Take 1  tablet (0.5 mg total) by mouth 2 (two) times daily as needed for anxiety. 04/01/19   [name], MD  polyethylene glycol (MIRALAX / GLYCOLAX) 17 g packet Take 17 g by mouth daily. 12/26/18   [provider]  prochlorperazine (COMPAZINE) 10 MG tablet Take 1 tablet (10 mg total) by mouth every 6 (six) hours as needed for nausea or vomiting. 02/23/19   Owens Shark, NP     Allergies:     Allergies  Allergen Reactions  . Adhesive [Tape] Hives, Shortness Of Breath and Swelling    Tolerates paper tape  . Clindamycin/Lincomycin Itching and Rash    Generalized body rash with itching, lip ulcers  . Promethazine Other (See Comments)    Increase restless leg movement   . Eggs Or Egg-Derived Products Nausea And Vomiting  . Metoclopramide Hcl Other (See Comments)    EXACERBATES RESTLESS LEG SYNDROME  . Oxycodone-Acetaminophen Hives  . Sulfa Antibiotics Nausea And Vomiting  . Penicillins Itching    Has patient had a PCN reaction causing immediate rash, facial/tongue/throat swelling, SOB or lightheadedness with hypotension: No Has patient had a PCN reaction causing severe rash involving mucus membranes or skin necrosis: No Has patient had a PCN reaction that required hospitalization No Has patient had a PCN reaction occurring within the last 10 years: No If all of the above answers are "NO", then may proceed with Cephalosporin use.      Physical Exam:   Vitals  Blood pressure 100/68, pulse 85, temperature 98.6 F (37 C), temperature source Oral, resp. rate 11, height 5\' 6"  (1.676 m), SpO2 100 %.  1.  General: Appears in no acute distress  2. Psychiatric: Somnolent but arousable  3. Neurologic: Somnolent, moving all extremities, no focal deficit noted  4. HEENMT:  Atraumatic normocephalic, extraocular muscles are intact  5. Respiratory : Clear to  auscultation bilaterally, no wheezing or crackles auscultated  6. Cardiovascular : S1-S2, regular, no murmur auscultated, no edema in the lower extremities  7. Gastrointestinal:  Abdomen is soft, nontender, no organomegaly     Data Review:    CBC Recent Labs  Lab 04/09/19 0927 04/09/19 2347  WBC 3.8* 4.5  HGB 11.2* 12.0  HCT 36.2 39.2  PLT 173 219  MCV 77.2* 78.7*  MCH 23.9* 24.1*  MCHC 30.9 30.6  RDW 15.9* 15.9*  LYMPHSABS 1.0 0.4*  MONOABS 0.2 0.1  EOSABS 0.0 0.0  BASOSABS 0.0 0.0   ------------------------------------------------------------------------------------------------------------------  Results for orders placed or performed during the hospital encounter of 04/09/19 (from the past 48 hour(s))  CBC with Differential     Status: Abnormal   Collection Time: 04/09/19 11:47 PM  Result Value Ref Range   WBC 4.5 4.0 - 10.5 K/uL   RBC 4.98 3.87 - 5.11 MIL/uL   Hemoglobin 12.0 12.0 - 15.0 g/dL   HCT 39.2 36.0 - 46.0 %   MCV 78.7 (L) 80.0 - 100.0 fL   MCH 24.1 (L) 26.0 - 34.0 pg   MCHC 30.6 30.0 - 36.0 g/dL   RDW 15.9 (H) 11.5 - 15.5 %   Platelets 219 150 - 400 K/uL   nRBC 0.0 0.0 - 0.2 %   Neutrophils Relative % 89 %   Neutro Abs 4.0 1.7 - 7.7 K/uL   Lymphocytes Relative 8 %   Lymphs Abs 0.4 (L) 0.7 - 4.0 K/uL   Monocytes Relative 2 %   Monocytes Absolute 0.1 0.1 - 1.0 K/uL   Eosinophils Relative 0 %  Eosinophils Absolute 0.0 0.0 - 0.5 K/uL   Basophils Relative 0 %   Basophils Absolute 0.0 0.0 - 0.1 K/uL   Immature Granulocytes 1 %   Abs Immature Granulocytes 0.04 0.00 - 0.07 K/uL    Comment: Performed at [facility], [address] [place], [place] [address]  Comprehensive metabolic panel     Status: Abnormal   Collection Time: 04/09/19 11:47 PM  Result Value Ref Range   Sodium 139 135 - 145 mmol/L   Potassium 3.7 3.5 - 5.1 mmol/L   Chloride 107 98 - 111 mmol/L   CO2 24 22 - 32 mmol/L   Glucose, Bld 200 (H) 70 - 99 mg/dL    Comment: Glucose  reference range applies only to samples taken after fasting for at least 8 hours.   BUN 11 6 - 20 mg/dL   Creatinine, Ser 0.59 0.44 - 1.00 mg/dL   Calcium 9.4 8.9 - 10.3 mg/dL   Total Protein 7.3 6.5 - 8.1 g/dL   Albumin 3.9 3.5 - 5.0 g/dL   AST 19 15 - 41 U/L   ALT 14 0 - 44 U/L   Alkaline Phosphatase 87 38 - 126 U/L   Total Bilirubin 0.7 0.3 - 1.2 mg/dL   GFR calc non Af Amer >60 >60 mL/min   GFR calc Af Amer >60 >60 mL/min   Anion gap 8 5 - 15    Comment: Performed at [facility], [address], [place], [place] [address]   *Note: Due to a large number of results and/or encounters for the requested time period, some results have not been displayed. A complete set of results can be found in Results Review.    Chemistries  Recent Labs  Lab 04/09/19 0927 04/09/19 2347  NA 143 139  K 3.7 3.7  CL 107 107  CO2 26 24  GLUCOSE 180* 200*  BUN 8 11  CREATININE 0.74 0.59  CALCIUM 9.0 9.4  AST 14* 19  ALT 10 14  ALKPHOS 96 87  BILITOT 0.4 0.7   ------------------------------------------------------------------------------------------------------------------  ------------------------------------------------------------------------------------------------------------------ GFR: Estimated Creatinine Clearance: 83.1 mL/min (by C-G formula based on SCr of 0.59 mg/dL). Liver Function Tests: Recent Labs  Lab 04/09/19 0927 04/09/19 2347  AST 14* 19  ALT 10 14  ALKPHOS 96 87  BILITOT 0.4 0.7  PROT 6.8 7.3  ALBUMIN 3.7 3.9    --------------------------------------------------------------------------------------------------------------- Urine analysis:    Component Value Date/Time   COLORURINE YELLOW 09/22/2018 1555   APPEARANCEUR CLEAR 09/22/2018 1555   LABSPEC 1.028 09/22/2018 1555   LABSPEC 1.030 08/17/2014 0834   PHURINE 5.0 09/22/2018 1555   GLUCOSEU NEGATIVE 09/22/2018 1555   GLUCOSEU Negative 08/17/2014 0834   HGBUR NEGATIVE 09/22/2018 1555   HGBUR negative  02/07/2009 1435   BILIRUBINUR NEGATIVE 09/22/2018 1555   BILIRUBINUR Negative 08/17/2014 0834   KETONESUR NEGATIVE 09/22/2018 1555   PROTEINUR 30 (A) 09/22/2018 1555   UROBILINOGEN 1.0 12/20/2014 1306   UROBILINOGEN 0.2 08/17/2014 0834   NITRITE NEGATIVE 09/22/2018 1555   LEUKOCYTESUR NEGATIVE 09/22/2018 1555   LEUKOCYTESUR Trace 08/17/2014 0834      Imaging Results:    CT Head Wo Contrast  Result Date: 04/10/2019 CLINICAL DATA:  Metastatic colon cancer, seizure EXAM: CT HEAD WITHOUT CONTRAST TECHNIQUE: Contiguous axial images were obtained from the base of the skull through the vertex without intravenous contrast. COMPARISON:  None. FINDINGS: Brain: No evidence of acute territorial infarction, hemorrhage, hydrocephalus,extra-axial collection or mass lesion/mass effect. Normal gray-white differentiation. Ventricles are normal in  size and contour. Vascular: No hyperdense vessel or unexpected calcification. Skull: The skull is intact. No fracture or focal lesion identified. Sinuses/Orbits: The visualized paranasal sinuses and mastoid air cells are clear. The orbits and globes intact. Other: None IMPRESSION: No acute intracranial abnormality. Electronically Signed   By: Prudencio Pair M.D.   On: 04/10/2019 00:50    My personal review of EKG: Rhythm NSR, no ST-T changes   Assessment & Plan:    Active Problems:   Seizure ([place])   1. Seizure-patient had 2 seizures tonight, last seizure was in 2014.  Patient has not been taking any medications for seizures.  Keppra loading dose 1,350 mg IV x1 given in the ED. we will start Keppra 500 mg IV twice daily.  Seizure precautions.  Admit to stepdown.  Neurology consultation in a.m.  CT head was unremarkable.  Ativan 2 mg IV every 4 hours as needed for seizure. 2. Metastatic colon cancer-patient on chemotherapy, followed by oncology as outpatient. 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  We will continue with Levaquin 750 mg daily.   DVT Prophylaxis-   SCDs  AM Labs Ordered, also please review Full Orders  Family Communication: Admission, patients condition and plan of care including tests being ordered have been discussed with the patient  who indicate understanding and agree with the plan and Code Status.  Code Status:    Admission status: Inpatient :The appropriate admission status for this patient is INPATIENT. Inpatient status is judged to be reasonable and necessary in order to provide the required intensity of service to ensure the patient's safety. The patient's presenting symptoms, physical exam findings, and initial radiographic and laboratory data in the context of their chronic comorbidities is felt to place them at high risk for further clinical deterioration. Furthermore, it is not anticipated that the patient will be medically stable for discharge from the hospital within 2 midnights of admission. The following factors support the admission status of inpatient.     The patient's presenting symptoms include seizure  The chronic co-morbidities include metastatic colon cancer       * I certify that at the point of admission it is my clinical judgment that the patient will require inpatient hospital care spanning beyond 2 midnights from the point of admission due to high intensity of service, high risk for further deterioration and high frequency of surveillance required.*  Time spent in minutes : 60 min   Alwyn Cordner S Breyden Jeudy M.D

## 2019-04-10 NOTE — ED Notes (Addendum)
Cherlyn Roberts, RN aware of need for Keppra 1350 mg IV from upstairs pharmacy.

## 2019-04-10 NOTE — Telephone Encounter (Signed)
Husband reported that patient was admitted to [facility] last night after a seizure. Had 2nd seizure this morning. Dr. Benay Spice notified.

## 2019-04-10 NOTE — Progress Notes (Signed)
Pt requested for her chemo drug to be resumed. Unable to do that since it has been disconnected. Pt also requested to take chemo drug with her so she can take to her appointment tomorrow morning. MD is ok with that. Pt called on call oncologist and she advised to leave port access and bring medication with her and they will take care of it in the morning. This RN called and verified with same on call oncologist and verified to leave port accessed at discharge. Pt is to be seen tomorrow morning.

## 2019-04-10 NOTE — Progress Notes (Signed)
EEG complete - results pending 

## 2019-04-11 ENCOUNTER — Inpatient Hospital Stay: Payer: BC Managed Care – PPO

## 2019-04-11 ENCOUNTER — Other Ambulatory Visit: Payer: Self-pay

## 2019-04-11 VITALS — BP 105/74 | HR 84 | Temp 98.3°F | Resp 17

## 2019-04-11 DIAGNOSIS — C182 Malignant neoplasm of ascending colon: Secondary | ICD-10-CM | POA: Diagnosis not present

## 2019-04-11 DIAGNOSIS — Z5111 Encounter for antineoplastic chemotherapy: Secondary | ICD-10-CM | POA: Diagnosis not present

## 2019-04-11 DIAGNOSIS — R161 Splenomegaly, not elsewhere classified: Secondary | ICD-10-CM | POA: Diagnosis not present

## 2019-04-11 DIAGNOSIS — Z5112 Encounter for antineoplastic immunotherapy: Secondary | ICD-10-CM | POA: Diagnosis not present

## 2019-04-11 DIAGNOSIS — B373 Candidiasis of vulva and vagina: Secondary | ICD-10-CM | POA: Diagnosis not present

## 2019-04-11 DIAGNOSIS — C7961 Secondary malignant neoplasm of right ovary: Secondary | ICD-10-CM | POA: Diagnosis not present

## 2019-04-11 DIAGNOSIS — C787 Secondary malignant neoplasm of liver and intrahepatic bile duct: Secondary | ICD-10-CM | POA: Diagnosis not present

## 2019-04-11 DIAGNOSIS — R509 Fever, unspecified: Secondary | ICD-10-CM | POA: Diagnosis not present

## 2019-04-11 DIAGNOSIS — G40909 Epilepsy, unspecified, not intractable, without status epilepticus: Secondary | ICD-10-CM | POA: Diagnosis not present

## 2019-04-11 DIAGNOSIS — D509 Iron deficiency anemia, unspecified: Secondary | ICD-10-CM | POA: Diagnosis not present

## 2019-04-11 DIAGNOSIS — Z452 Encounter for adjustment and management of vascular access device: Secondary | ICD-10-CM | POA: Diagnosis not present

## 2019-04-11 DIAGNOSIS — R21 Rash and other nonspecific skin eruption: Secondary | ICD-10-CM | POA: Diagnosis not present

## 2019-04-11 DIAGNOSIS — D709 Neutropenia, unspecified: Secondary | ICD-10-CM | POA: Diagnosis not present

## 2019-04-11 LAB — URINE CULTURE: Culture: NO GROWTH

## 2019-04-11 MED ORDER — SODIUM CHLORIDE 0.9% FLUSH
3.0000 mL | INTRAVENOUS | Status: DC | PRN
  Administered 2019-04-11: 10:00:00 3 mL
  Filled 2019-04-11: qty 10

## 2019-04-11 MED ORDER — HEPARIN SOD (PORK) LOCK FLUSH 100 UNIT/ML IV SOLN
500.0000 [IU] | Freq: Once | INTRAVENOUS | Status: AC | PRN
  Administered 2019-04-11: 500 [IU]
  Filled 2019-04-11: qty 5

## 2019-04-11 MED ORDER — PEGFILGRASTIM-CBQV 6 MG/0.6ML ~~LOC~~ SOSY: 6.0000 mg | PREFILLED_SYRINGE | Freq: Once | SUBCUTANEOUS | Status: DC

## 2019-04-13 ENCOUNTER — Telehealth: Payer: Self-pay | Admitting: Oncology

## 2019-04-13 NOTE — Telephone Encounter (Signed)
Scheduled per los. Called and left msg about added appt. Mailed printout  

## 2019-04-19 ENCOUNTER — Other Ambulatory Visit: Payer: Self-pay | Admitting: Oncology

## 2019-04-21 ENCOUNTER — Telehealth: Payer: Self-pay | Admitting: *Deleted

## 2019-04-21 ENCOUNTER — Encounter: Payer: Self-pay | Admitting: Oncology

## 2019-04-21 NOTE — Telephone Encounter (Addendum)
Left VM "it feels like something is trying to come out of the area of my previous colostomy". Expressed that she does not want to return to [facility] saying she is uncomfortable seeing them since she belives the last antibiotic ordered from them caused her to have two seizures. Per Dr. Benay Spice: Have her contact [facility], Dr. Barry Dienes who did her original surgery. Called patient back and she does not want to call Dr. Barry Dienes. She is asking for an xray or scan to evaluate what his going on. Reports that [facility] opened up area and aspirated. It is closed now, but she still feels as if "something" is trying to come out.  Dr. Benay Spice prefers to wait and examine her prior to ordering any xrays or scans. Asked patient if she can wait till her 3/18 appointment for MD to examine her first. She agrees to wait. Understands to call if wound opens up, becomes red or she develops a fever.

## 2019-04-22 DIAGNOSIS — L02211 Cutaneous abscess of abdominal wall: Secondary | ICD-10-CM | POA: Diagnosis not present

## 2019-04-23 ENCOUNTER — Inpatient Hospital Stay: Payer: BC Managed Care – PPO

## 2019-04-23 ENCOUNTER — Encounter: Payer: Self-pay | Admitting: Nurse Practitioner

## 2019-04-23 ENCOUNTER — Other Ambulatory Visit: Payer: Self-pay

## 2019-04-23 ENCOUNTER — Inpatient Hospital Stay (HOSPITAL_BASED_OUTPATIENT_CLINIC_OR_DEPARTMENT_OTHER): Payer: BC Managed Care – PPO | Admitting: Nurse Practitioner

## 2019-04-23 ENCOUNTER — Telehealth: Payer: Self-pay | Admitting: *Deleted

## 2019-04-23 VITALS — BP 111/74 | HR 92 | Temp 98.2°F | Resp 17 | Ht 66.0 in | Wt 144.5 lb

## 2019-04-23 DIAGNOSIS — Z95828 Presence of other vascular implants and grafts: Secondary | ICD-10-CM

## 2019-04-23 DIAGNOSIS — B373 Candidiasis of vulva and vagina: Secondary | ICD-10-CM | POA: Diagnosis not present

## 2019-04-23 DIAGNOSIS — C182 Malignant neoplasm of ascending colon: Secondary | ICD-10-CM | POA: Diagnosis not present

## 2019-04-23 DIAGNOSIS — R161 Splenomegaly, not elsewhere classified: Secondary | ICD-10-CM | POA: Diagnosis not present

## 2019-04-23 DIAGNOSIS — R509 Fever, unspecified: Secondary | ICD-10-CM | POA: Diagnosis not present

## 2019-04-23 DIAGNOSIS — Z452 Encounter for adjustment and management of vascular access device: Secondary | ICD-10-CM | POA: Diagnosis not present

## 2019-04-23 DIAGNOSIS — Z5112 Encounter for antineoplastic immunotherapy: Secondary | ICD-10-CM | POA: Diagnosis not present

## 2019-04-23 DIAGNOSIS — R21 Rash and other nonspecific skin eruption: Secondary | ICD-10-CM | POA: Diagnosis not present

## 2019-04-23 DIAGNOSIS — C787 Secondary malignant neoplasm of liver and intrahepatic bile duct: Secondary | ICD-10-CM | POA: Diagnosis not present

## 2019-04-23 DIAGNOSIS — Z5111 Encounter for antineoplastic chemotherapy: Secondary | ICD-10-CM | POA: Diagnosis not present

## 2019-04-23 DIAGNOSIS — D709 Neutropenia, unspecified: Secondary | ICD-10-CM | POA: Diagnosis not present

## 2019-04-23 DIAGNOSIS — G40909 Epilepsy, unspecified, not intractable, without status epilepticus: Secondary | ICD-10-CM | POA: Diagnosis not present

## 2019-04-23 DIAGNOSIS — D509 Iron deficiency anemia, unspecified: Secondary | ICD-10-CM | POA: Diagnosis not present

## 2019-04-23 DIAGNOSIS — C7961 Secondary malignant neoplasm of right ovary: Secondary | ICD-10-CM | POA: Diagnosis not present

## 2019-04-23 LAB — CBC WITH DIFFERENTIAL (CANCER CENTER ONLY)
Abs Immature Granulocytes: 0.01 10*3/uL (ref 0.00–0.07)
Basophils Absolute: 0 10*3/uL (ref 0.0–0.1)
Basophils Relative: 0 %
Eosinophils Absolute: 0 10*3/uL (ref 0.0–0.5)
Eosinophils Relative: 2 %
HCT: 35.6 % — ABNORMAL LOW (ref 36.0–46.0)
Hemoglobin: 10.9 g/dL — ABNORMAL LOW (ref 12.0–15.0)
Immature Granulocytes: 0 %
Lymphocytes Relative: 26 %
Lymphs Abs: 0.7 10*3/uL (ref 0.7–4.0)
MCH: 23.9 pg — ABNORMAL LOW (ref 26.0–34.0)
MCHC: 30.6 g/dL (ref 30.0–36.0)
MCV: 77.9 fL — ABNORMAL LOW (ref 80.0–100.0)
Monocytes Absolute: 0.2 10*3/uL (ref 0.1–1.0)
Monocytes Relative: 9 %
Neutro Abs: 1.6 10*3/uL — ABNORMAL LOW (ref 1.7–7.7)
Neutrophils Relative %: 63 %
Platelet Count: 108 10*3/uL — ABNORMAL LOW (ref 150–400)
RBC: 4.57 MIL/uL (ref 3.87–5.11)
RDW: 16.5 % — ABNORMAL HIGH (ref 11.5–15.5)
WBC Count: 2.6 10*3/uL — ABNORMAL LOW (ref 4.0–10.5)
nRBC: 0 % (ref 0.0–0.2)

## 2019-04-23 LAB — CMP (CANCER CENTER ONLY)
ALT: 8 U/L (ref 0–44)
AST: 11 U/L — ABNORMAL LOW (ref 15–41)
Albumin: 3.5 g/dL (ref 3.5–5.0)
Alkaline Phosphatase: 101 U/L (ref 38–126)
Anion gap: 12 (ref 5–15)
BUN: 12 mg/dL (ref 6–20)
CO2: 23 mmol/L (ref 22–32)
Calcium: 9.2 mg/dL (ref 8.9–10.3)
Chloride: 106 mmol/L (ref 98–111)
Creatinine: 0.81 mg/dL (ref 0.44–1.00)
GFR, Est AFR Am: >60 mL/min (ref >60)
GFR, Estimated: >60 mL/min (ref >60)
Glucose, Bld: 234 mg/dL — ABNORMAL HIGH (ref 70–99)
Potassium: 3.9 mmol/L (ref 3.5–5.1)
Sodium: 141 mmol/L (ref 135–145)
Total Bilirubin: 0.9 mg/dL (ref 0.3–1.2)
Total Protein: 6.7 g/dL (ref 6.5–8.1)

## 2019-04-23 LAB — MAGNESIUM: Magnesium: 1.4 mg/dL — CL (ref 1.7–2.4)

## 2019-04-23 LAB — CEA (IN HOUSE-CHCC): CEA (CHCC-In House): 40.53 ng/mL — ABNORMAL HIGH (ref 0.00–5.00)

## 2019-04-23 MED ORDER — HEPARIN SOD (PORK) LOCK FLUSH 100 UNIT/ML IV SOLN
500.0000 [IU] | Freq: Once | INTRAVENOUS | Status: AC
  Administered 2019-04-23: 12:00:00 500 [IU] via INTRAVENOUS
  Filled 2019-04-23: qty 5

## 2019-04-23 MED ORDER — HEPARIN SOD (PORK) LOCK FLUSH 100 UNIT/ML IV SOLN
500.0000 [IU] | Freq: Once | INTRAVENOUS | Status: DC
  Filled 2019-04-23: qty 5

## 2019-04-23 MED ORDER — SODIUM CHLORIDE 0.9% FLUSH
10.0000 mL | INTRAVENOUS | Status: DC | PRN
  Administered 2019-04-23: 10 mL via INTRAVENOUS
  Filled 2019-04-23: qty 10

## 2019-04-23 NOTE — Progress Notes (Addendum)
[place] OFFICE PROGRESS NOTE   Diagnosis: Colon cancer  INTERVAL HISTORY:   [name] returns as scheduled.  She began cycle 3 FOLFIRI/Panitumumab 04/09/2019.  She was hospitalized that night after having a seizure at home.  Brain CT negative.  She is now taking Keppra 500 mg twice daily.  No further seizure activity.  She had a fever to greater than 102 degrees two nights ago.  Temperatures since no higher than 99.5 but is taking Tylenol frequently.  She was seen at [facility] by the general surgery service yesterday due to increased left-sided abdominal pain.  She underwent incision and drainage of left side abscess of the abdominal wall.  Purulent drainage was evacuated with suction.  The incision was packed.  She is scheduled to follow-up with surgery in 1 week.  She was prescribed Vicodin but is concerned about taking this due to risk of recurrent seizures.  No nausea or vomiting.  Some diarrhea.  No mouth sores.  Skin rash mainly on the chin has increased since doxycycline was discontinued.  Objective:  Vital signs in last 24 hours:  Blood pressure 111/74, pulse 92, temperature 98.2 F (36.8 C), temperature source Temporal, resp. rate 17, height 5' 6" (1.676 m), weight 144 lb 8 oz (65.5 kg), SpO2 100 %.    HEENT: Acne type rash chin/perioral region. GI: No hepatomegaly.  Left-sided abdominal wound is packed.  There is surrounding erythema/induration.  Packing material and dressing with blood noted. Vascular: No leg edema. Neuro: Alert and oriented. Skin: Palms without erythema. Port-A-Cath without erythema.   Lab Results:  Lab Results  Component Value Date   WBC 2.6 (L) 04/23/2019   HGB 10.9 (L) 04/23/2019   HCT 35.6 (L) 04/23/2019   MCV 77.9 (L) 04/23/2019   PLT 108 (L) 04/23/2019   NEUTROABS 1.6 (L) 04/23/2019    Imaging:  No results found.  Medications: I have reviewed the patient's current medications.  Assessment/Plan: 1. 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  The tumor returned microsatellite stable with no loss of mismatch repair protein expression   APC mutated. No BRAF, KRAS, or NRAS mutation On Foundation 1 testing   Cycle 1 adjuvant FOLFOX 09/08/2013   Cycle 2 adjuvant FOLFOX 09/24/2013   Cycle 3 adjuvant FOLFOX 10/08/2013.   Cycle 4 adjuvant FOLFOX 10/22/2013.   Cycle 5 adjuvant FOLFOX 11/05/2013. Oxaliplatin held due to thrombocytopenia.  Cycle 6 FOLFOX 11/19/2013.  Cycle 7 FOLFOX 12/03/2013. Oxaliplatin held secondary to thrombocytopenia.  Cycle 8 FOLFOX 12/17/2013.  Cycle 9 FOLFOX 01/04/2014. Oxaliplatin held secondary to neutropenia.   Cycle 10 FOLFOX 01/21/2014. Oxaliplatin held secondary to thrombocytopenia.  Cycle 11 FOLFOX 02/04/2014  Cycle 12 FOLFOX 02/18/2014, oxaliplatin dose reduced secondary tothrombocytopenia  CT abdomen/pelvis 01/30/2014 revealed splenomegaly and no evidence of recurrent colon cancer  CT chest 04/07/2014 with a stable right lower lobe nodule and no evidence for metastatic disease, no nodules seen on the CT 11/26/2014  Markedly elevated CEA 11/24/2014  CT 11/26/2014 revealed a right pelvic mass, splenomegaly, small volume ascites  Right salpingo-oophorectomy 12/28/2014 with the pathology confirming metastatic colon cancer  CTs 03/23/2016-no evidence of recurrent or metastatic disease.  CT 11/26/2016-enlargement of a fluid density structure the right pelvic sidewall, no other evidence of metastatic disease  CT aspiration right pelvic cyst 12/19/2016.Cytology-BENIGN REACTIVE/REPARATIVE CHANGES.  CTs 06/05/2017-no evidence of metastatic disease, mild cirrhotic changes with splenomegaly  CTs 12/11/2017-recurrent cystic right adnexal mass, similar  to on the CT 11/26/2016, stable mild splenomegaly  CTs 04/23/2018-enlargement of cystic right adnexal mass  with mural nodularity, no other evidence of metastatic disease  Cytoreductive surgery/HIPEC with mitomycin by Dr. Clovis Riley at [facility] 08/12/2018-R1 resection achieved. Cytoreduction included omentectomy, LAR, right salpingo-oophorectomy and left colonic gutter/pelvic stripping. Pathology on the rectum showed recurrent/metastatic adenocarcinoma, tumor 2.0 cm, predominantly involving the subserosa and muscularis propria of the colon, proximal and distal margins of resection were negative, vascular invasion present, metastatic carcinoma present in 1 out of 5 lymph nodes; omentum resection with no malignancy seen, no metastatic carcinoma identified in 1 lymph node examined; left gutter stripping positive metastatic adenocarcinoma; right ovary resection positive metastatic adenocarcinoma.  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  CEA 68 02/03/2019  Biopsy liver lesion 02/11/2019-metastatic adenocarcinoma consistent with primary colonic adenocarcinoma  CTs 02/27/2019-multiple liver lesions increased in size, new lesion in the lateral right lobe of the liver. No evidence of metastatic disease in the chest. Redemonstrated moderate left hydronephrosis and proximal hydroureter without discrete lesion or obstructing etiology at the transition point of the mid ureter.  Cycle 1 FOLFIRI/Panitumumab 03/12/2019  Cycle 2 FOLFIRI/Panitumumab 03/26/2019-bolus 5-FU and irinotecan held secondary to neutropenia  Cycle 3 FOLFIRI/Panitumumab 04/09/2019, Udenyca added 2. Mild elevation of the CEA beginning January 2016, normal on 05/19/2014   3. History of iron deficiency anemia  4. seizure disorder; seizure 04/09/2019.  Brain CT negative.  Now on Keppra. 5. history of depression  6. 4 mm right lower lobe nodule on a staging chest CT 09/08/2013 , stable on a CT 04/07/2014 7. Hospitalization 09/24/2013 through 09/26/2013 with fever and abdominal  pain.  8. 09/24/2013 urine culture positive for coag negative staph.  9. 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 Splenomegaly noted on a CT scan 01/30/2014,persistent on repeat CTs 12. Colonoscopy 11/17/2018-flexible sigmoidoscopy per rectum with changes of mild diversion colitis. Scope advanced for approximately 25 cm. Most proximal portion had necrotic appearing debris. Colostomy bag insufflated suggesting some type of communication between the pouch and the proximal colon. Introduction of scope into the ostomy found to available directions. 1 toward the distal pouch with similar appearing necrotic debris encountered. The other was about a 30 cm segment of normal-appearing colonic mucosa to the level of the previous right hemicolectomy ileocolonic anastomosis. 35. Port-A-Cath placement interventional radiology 03/05/2019 14. Possible abdominal wall abscess status post evaluation by surgery at [facility] 04/03/2019, antibiotics initiated; incision and drainage with purulent material removed 04/22/2019   Disposition: [name] has completed 3 cycles of FOLFIRI/Panitumumab.  The 5-fluorouracil pump was prematurely discontinued after cycle 3 due to hospitalization with seizure activity.  She has a history of a seizure disorder, reports last seizure was 7 years ago.  Brain scan was negative.  She is now on Keppra.  She had recent increasing pain at the left abdominal wound, fever.  She was seen at [facility].  Incision and drainage performed with purulent material noted.  The wound is now packed.  She is scheduled to follow-up with surgery in 1 week with the plan for CT scans in 2 weeks.  We reviewed the CBC from today.  She has mild neutropenia.  Due to the active infection involving the abdominal wound and borderline neutrophil count we decided to hold today's treatment.  She will return for lab,  follow-up, possible treatment in 2 weeks.  She will contact the office in the interim with any problems.  Patient seen with Dr. Benay Spice.    Ned Card ANP/GNP-BC   04/23/2019  11:05 AM This was a shared visit with Ned Card.  [name] was interviewed and examined.  She had a seizure following the last cycle of chemotherapy.  We doubt this was related to the chemotherapy regimen.  We reviewed her current medical regimen with the Cancer center pharmacy.  She will resume hydrocodone and doxycycline.  She has developed an open/infected left abdominal wound.  She will follow up with the surgical service at [facility] next week.  We will hold chemotherapy until after she undergoes a restaging CT evaluation at [facility].  She has mild neutropenia today.  Julieanne Manson, MD

## 2019-04-23 NOTE — Progress Notes (Signed)
Magnesium 1.4. Provider Ned Card, NP made aware

## 2019-04-23 NOTE — Telephone Encounter (Signed)
Per Ned Card, NP, called to make pt aware, due to decrease in magnesium, we ask the she takes 400 mg bid for magnesium OTC. Also okay to resume the doxycycline and take Vicodin for pain. Pt verbalized understanding.

## 2019-04-25 ENCOUNTER — Inpatient Hospital Stay: Payer: BC Managed Care – PPO

## 2019-04-29 DIAGNOSIS — L02211 Cutaneous abscess of abdominal wall: Secondary | ICD-10-CM | POA: Diagnosis not present

## 2019-05-01 NOTE — Progress Notes (Signed)
Pharmacist Chemotherapy Monitoring - Follow Up Assessment    I verify that I have reviewed each item in the below checklist:  . Regimen for the patient is scheduled for the appropriate day and plan matches scheduled date. Marland Kitchen Appropriate non-routine labs are ordered dependent on drug ordered. . If applicable, additional medications reviewed and ordered per protocol based on lifetime cumulative doses and/or treatment regimen.   Plan for follow-up and/or issues identified: No . I-vent associated with next due treatment: No . MD and/or nursing notified: No  [name] 05/01/2019 11:27 AM

## 2019-05-04 DIAGNOSIS — C189 Malignant neoplasm of colon, unspecified: Secondary | ICD-10-CM | POA: Diagnosis not present

## 2019-05-04 DIAGNOSIS — K6389 Other specified diseases of intestine: Secondary | ICD-10-CM | POA: Diagnosis not present

## 2019-05-04 DIAGNOSIS — L02211 Cutaneous abscess of abdominal wall: Secondary | ICD-10-CM | POA: Diagnosis not present

## 2019-05-04 DIAGNOSIS — S00502D Unspecified superficial injury of oral cavity, subsequent encounter: Secondary | ICD-10-CM | POA: Diagnosis not present

## 2019-05-04 DIAGNOSIS — N1330 Unspecified hydronephrosis: Secondary | ICD-10-CM | POA: Diagnosis not present

## 2019-05-04 DIAGNOSIS — C182 Malignant neoplasm of ascending colon: Secondary | ICD-10-CM | POA: Diagnosis not present

## 2019-05-04 DIAGNOSIS — L0211 Cutaneous abscess of neck: Secondary | ICD-10-CM | POA: Diagnosis not present

## 2019-05-04 DIAGNOSIS — X58XXXD Exposure to other specified factors, subsequent encounter: Secondary | ICD-10-CM | POA: Diagnosis not present

## 2019-05-04 DIAGNOSIS — S0993XD Unspecified injury of face, subsequent encounter: Secondary | ICD-10-CM | POA: Diagnosis not present

## 2019-05-06 ENCOUNTER — Other Ambulatory Visit: Payer: Self-pay | Admitting: *Deleted

## 2019-05-06 DIAGNOSIS — C182 Malignant neoplasm of ascending colon: Secondary | ICD-10-CM

## 2019-05-07 ENCOUNTER — Ambulatory Visit
Admission: RE | Admit: 2019-05-07 | Discharge: 2019-05-07 | Disposition: A | Discharge status: Home / Self Care | Payer: Self-pay | Source: Ambulatory Visit | Attending: Oncology | Admitting: Oncology

## 2019-05-07 ENCOUNTER — Inpatient Hospital Stay: Payer: BC Managed Care – PPO

## 2019-05-07 ENCOUNTER — Other Ambulatory Visit: Payer: Self-pay

## 2019-05-07 ENCOUNTER — Other Ambulatory Visit: Payer: Self-pay | Admitting: *Deleted

## 2019-05-07 ENCOUNTER — Inpatient Hospital Stay: Payer: BC Managed Care – PPO | Attending: Oncology | Admitting: Oncology

## 2019-05-07 VITALS — BP 132/82 | HR 89 | Temp 98.3°F | Resp 18 | Ht 66.0 in | Wt 150.0 lb

## 2019-05-07 DIAGNOSIS — D509 Iron deficiency anemia, unspecified: Secondary | ICD-10-CM | POA: Insufficient documentation

## 2019-05-07 DIAGNOSIS — Z452 Encounter for adjustment and management of vascular access device: Secondary | ICD-10-CM | POA: Insufficient documentation

## 2019-05-07 DIAGNOSIS — Z5111 Encounter for antineoplastic chemotherapy: Secondary | ICD-10-CM | POA: Insufficient documentation

## 2019-05-07 DIAGNOSIS — C182 Malignant neoplasm of ascending colon: Secondary | ICD-10-CM

## 2019-05-07 DIAGNOSIS — Z5189 Encounter for other specified aftercare: Secondary | ICD-10-CM | POA: Insufficient documentation

## 2019-05-07 DIAGNOSIS — Z5112 Encounter for antineoplastic immunotherapy: Secondary | ICD-10-CM | POA: Insufficient documentation

## 2019-05-07 DIAGNOSIS — C7961 Secondary malignant neoplasm of right ovary: Secondary | ICD-10-CM | POA: Insufficient documentation

## 2019-05-07 DIAGNOSIS — C787 Secondary malignant neoplasm of liver and intrahepatic bile duct: Secondary | ICD-10-CM | POA: Insufficient documentation

## 2019-05-07 DIAGNOSIS — R21 Rash and other nonspecific skin eruption: Secondary | ICD-10-CM | POA: Diagnosis not present

## 2019-05-07 DIAGNOSIS — D6959 Other secondary thrombocytopenia: Secondary | ICD-10-CM | POA: Insufficient documentation

## 2019-05-07 DIAGNOSIS — Z95828 Presence of other vascular implants and grafts: Secondary | ICD-10-CM

## 2019-05-07 LAB — MAGNESIUM: Magnesium: 1.4 mg/dL — CL (ref 1.7–2.4)

## 2019-05-07 LAB — CBC WITH DIFFERENTIAL (CANCER CENTER ONLY)
Abs Immature Granulocytes: 0.01 10*3/uL (ref 0.00–0.07)
Basophils Absolute: 0 10*3/uL (ref 0.0–0.1)
Basophils Relative: 0 %
Eosinophils Absolute: 0 10*3/uL (ref 0.0–0.5)
Eosinophils Relative: 1 %
HCT: 34.7 % — ABNORMAL LOW (ref 36.0–46.0)
Hemoglobin: 10.7 g/dL — ABNORMAL LOW (ref 12.0–15.0)
Immature Granulocytes: 0 %
Lymphocytes Relative: 25 %
Lymphs Abs: 0.7 10*3/uL (ref 0.7–4.0)
MCH: 23.7 pg — ABNORMAL LOW (ref 26.0–34.0)
MCHC: 30.8 g/dL (ref 30.0–36.0)
MCV: 76.9 fL — ABNORMAL LOW (ref 80.0–100.0)
Monocytes Absolute: 0.2 10*3/uL (ref 0.1–1.0)
Monocytes Relative: 5 %
Neutro Abs: 2 10*3/uL (ref 1.7–7.7)
Neutrophils Relative %: 69 %
Platelet Count: 151 10*3/uL (ref 150–400)
RBC: 4.51 MIL/uL (ref 3.87–5.11)
RDW: 16.4 % — ABNORMAL HIGH (ref 11.5–15.5)
WBC Count: 3 10*3/uL — ABNORMAL LOW (ref 4.0–10.5)
nRBC: 0 % (ref 0.0–0.2)

## 2019-05-07 LAB — CMP (CANCER CENTER ONLY)
ALT: 11 U/L (ref 0–44)
AST: 17 U/L (ref 15–41)
Albumin: 3.6 g/dL (ref 3.5–5.0)
Alkaline Phosphatase: 94 U/L (ref 38–126)
Anion gap: 13 (ref 5–15)
BUN: 9 mg/dL (ref 6–20)
CO2: 21 mmol/L — ABNORMAL LOW (ref 22–32)
Calcium: 9 mg/dL (ref 8.9–10.3)
Chloride: 111 mmol/L (ref 98–111)
Creatinine: 0.75 mg/dL (ref 0.44–1.00)
GFR, Est AFR Am: >60 mL/min (ref >60)
GFR, Estimated: >60 mL/min (ref >60)
Glucose, Bld: 180 mg/dL — ABNORMAL HIGH (ref 70–99)
Potassium: 3.9 mmol/L (ref 3.5–5.1)
Sodium: 145 mmol/L (ref 135–145)
Total Bilirubin: 0.4 mg/dL (ref 0.3–1.2)
Total Protein: 6.7 g/dL (ref 6.5–8.1)

## 2019-05-07 MED ORDER — MAGNESIUM SULFATE 2 GM/50ML IV SOLN
INTRAVENOUS | Status: AC
  Filled 2019-05-07: qty 50

## 2019-05-07 MED ORDER — SODIUM CHLORIDE 0.9 % IV SOLN
6.0000 mg/kg | Freq: Once | INTRAVENOUS | Status: AC
  Administered 2019-05-07: 13:00:00 400 mg via INTRAVENOUS
  Filled 2019-05-07: qty 20

## 2019-05-07 MED ORDER — HEPARIN SOD (PORK) LOCK FLUSH 100 UNIT/ML IV SOLN
500.0000 [IU] | Freq: Once | INTRAVENOUS | Status: DC | PRN
  Filled 2019-05-07: qty 5

## 2019-05-07 MED ORDER — FLUOROURACIL CHEMO INJECTION 2.5 GM/50ML
400.0000 mg/m2 | Freq: Once | INTRAVENOUS | Status: AC
  Administered 2019-05-07: 15:00:00 700 mg via INTRAVENOUS
  Filled 2019-05-07: qty 14

## 2019-05-07 MED ORDER — DEXAMETHASONE SODIUM PHOSPHATE 10 MG/ML IJ SOLN
10.0000 mg | Freq: Once | INTRAMUSCULAR | Status: AC
  Administered 2019-05-07: 10 mg via INTRAVENOUS

## 2019-05-07 MED ORDER — SODIUM CHLORIDE 0.9 % IV SOLN
400.0000 mg/m2 | Freq: Once | INTRAVENOUS | Status: AC
  Administered 2019-05-07: 13:00:00 716 mg via INTRAVENOUS
  Filled 2019-05-07: qty 35.8

## 2019-05-07 MED ORDER — SODIUM CHLORIDE 0.9 % IV SOLN
Freq: Once | INTRAVENOUS | Status: AC
  Filled 2019-05-07: qty 250

## 2019-05-07 MED ORDER — ATROPINE SULFATE 1 MG/ML IJ SOLN
INTRAMUSCULAR | Status: AC
  Filled 2019-05-07: qty 1

## 2019-05-07 MED ORDER — DEXAMETHASONE SODIUM PHOSPHATE 10 MG/ML IJ SOLN
INTRAMUSCULAR | Status: AC
  Filled 2019-05-07: qty 1

## 2019-05-07 MED ORDER — SODIUM CHLORIDE 0.9 % IV SOLN
180.0000 mg/m2 | Freq: Once | INTRAVENOUS | Status: AC
  Administered 2019-05-07: 13:00:00 320 mg via INTRAVENOUS
  Filled 2019-05-07: qty 15

## 2019-05-07 MED ORDER — PALONOSETRON HCL INJECTION 0.25 MG/5ML
INTRAVENOUS | Status: AC
  Filled 2019-05-07: qty 5

## 2019-05-07 MED ORDER — ATROPINE SULFATE 1 MG/ML IJ SOLN
0.5000 mg | Freq: Once | INTRAMUSCULAR | Status: AC | PRN
  Administered 2019-05-07: 13:00:00 0.5 mg via INTRAVENOUS

## 2019-05-07 MED ORDER — SODIUM CHLORIDE 0.9% FLUSH
10.0000 mL | INTRAVENOUS | Status: DC | PRN
  Administered 2019-05-07: 10:00:00 10 mL via INTRAVENOUS
  Filled 2019-05-07: qty 10

## 2019-05-07 MED ORDER — MAGNESIUM SULFATE 2 GM/50ML IV SOLN
2.0000 g | Freq: Once | INTRAVENOUS | Status: AC
  Administered 2019-05-07: 13:00:00 2 g via INTRAVENOUS

## 2019-05-07 MED ORDER — SODIUM CHLORIDE 0.9 % IV SOLN
2400.0000 mg/m2 | INTRAVENOUS | Status: DC
  Administered 2019-05-07: 4300 mg via INTRAVENOUS
  Filled 2019-05-07: qty 86

## 2019-05-07 MED ORDER — SODIUM CHLORIDE 0.9% FLUSH
10.0000 mL | INTRAVENOUS | Status: DC | PRN
  Filled 2019-05-07: qty 10

## 2019-05-07 MED ORDER — PALONOSETRON HCL INJECTION 0.25 MG/5ML
0.2500 mg | Freq: Once | INTRAVENOUS | Status: AC
  Administered 2019-05-07: 12:00:00 0.25 mg via INTRAVENOUS

## 2019-05-07 NOTE — Progress Notes (Signed)
Blood return noted before, during, and after Fluorouracil IV administration.

## 2019-05-07 NOTE — Progress Notes (Signed)
Received critical lab call from [place]. Patients MAG 1.4 Dr Benay Spice made aware.

## 2019-05-07 NOTE — Patient Instructions (Signed)
[place] Discharge Instructions for Patients Receiving Chemotherapy  Today you received the following Immunotherapy agent: Panitumumab (Vectibix) Chemotherapy agents: Irinotecan, Leucovorin, and Fluorouracil  To help prevent nausea and vomiting after your treatment, we encourage you to take your nausea medication as directed by your MD.   If you develop nausea and vomiting that is not controlled by your nausea medication, call the clinic.   BELOW ARE SYMPTOMS THAT SHOULD BE REPORTED IMMEDIATELY:  *FEVER GREATER THAN 100.5 F  *CHILLS WITH OR WITHOUT FEVER  NAUSEA AND VOMITING THAT IS NOT CONTROLLED WITH YOUR NAUSEA MEDICATION  *UNUSUAL SHORTNESS OF BREATH  *UNUSUAL BRUISING OR BLEEDING  TENDERNESS IN MOUTH AND THROAT WITH OR WITHOUT PRESENCE OF ULCERS  *URINARY PROBLEMS  *BOWEL PROBLEMS  UNUSUAL RASH Items with * indicate a potential emergency and should be followed up as soon as possible.  Feel free to call the clinic should you have any questions or concerns. The clinic phone number is [phone].  Please show the [place] at check-in to the Emergency Department and triage nurse.   Hypomagnesemia Hypomagnesemia is a condition in which the level of magnesium in the blood is low. Magnesium is a mineral that is found in many foods. It is used in many different processes in the body. Hypomagnesemia can affect every organ in the body. In severe cases, it can cause life-threatening problems. What are the causes? This condition may be caused by:  Not getting enough magnesium in your diet.  Malnutrition.  Problems with absorbing magnesium from the intestines.  Dehydration.  Alcohol abuse.  Vomiting.  Severe or chronic diarrhea.  Some medicines, including medicines that make you urinate more (diuretics).  Certain diseases, such as kidney disease, diabetes, celiac disease, and overactive thyroid. What are the signs or symptoms? Symptoms of  this condition include:  Loss of appetite.  Nausea and vomiting.  Involuntary shaking or trembling of a body part (tremor).  Muscle weakness.  Tingling in the arms and legs.  Sudden tightening of muscles (muscle spasms).  Confusion.  Psychiatric issues, such as depression, irritability, or psychosis.  A feeling of fluttering of the heart.  Seizures. These symptoms are more severe if magnesium levels drop suddenly. How is this diagnosed? This condition may be diagnosed based on:  Your symptoms and medical history.  A physical exam.  Blood and urine tests. How is this treated? Treatment depends on the cause and the severity of the condition. It may be treated with:  A magnesium supplement. This can be taken in pill form. If the condition is severe, magnesium is usually given through an IV.  Changes to your diet. You may be directed to eat foods that have a lot of magnesium, such as green leafy vegetables, peas, beans, and nuts.  Stopping any intake of alcohol. Follow these instructions at home:      Make sure that your diet includes foods with magnesium. Foods that have a lot of magnesium in them include: ? Green leafy vegetables, such as spinach and broccoli. ? Beans and peas. ? Nuts and seeds, such as almonds and sunflower seeds. ? Whole grains, such as whole grain bread and fortified cereals.  Take magnesium supplements if your health care provider tells you to do that. Take them as directed.  Take over-the-counter and prescription medicines only as told by your health care provider.  Have your magnesium levels monitored as told by your health care provider.  When you are active, drink fluids that  contain electrolytes.  Avoid drinking alcohol.  Keep all follow-up visits as told by your health care provider. This is important. Contact a health care provider if:  You get worse instead of better.  Your symptoms return. Get help right away if  you:  Develop severe muscle weakness.  Have trouble breathing.  Feel that your heart is racing. Summary  Hypomagnesemia is a condition in which the level of magnesium in the blood is low.  Hypomagnesemia can affect every organ in the body.  Treatment may include eating more foods that contain magnesium, taking magnesium supplements, and not drinking alcohol.  Have your magnesium levels monitored as told by your health care provider. This information is not intended to replace advice given to you by your health care provider. Make sure you discuss any questions you have with your health care provider. Document Revised: 01/04/2017 Document Reviewed: 12/24/2016 Elsevier Patient Education  2020 [place] (COVID-19) Are you at risk?  Are you at risk for the Coronavirus (COVID-19)?  To be considered HIGH RISK for Coronavirus (COVID-19), you have to meet the following criteria:  . Traveled to [place], [place], [place], [place] or [place]; or in the [place] to [place], [place], [place], or [place]; and have fever, cough, and shortness of breath within the last 2 weeks of travel OR . Been in close contact with a person diagnosed with COVID-19 within the last 2 weeks and have fever, cough, and shortness of breath . IF YOU DO NOT MEET THESE CRITERIA, YOU ARE CONSIDERED LOW RISK FOR COVID-19.  What to do if you are HIGH RISK for COVID-19?  Marland Kitchen If you are having a medical emergency, call 911. . Seek medical care right away. Before you go to a doctor's office, urgent care or emergency department, call ahead and tell them about your recent travel, contact with someone diagnosed with COVID-19, and your symptoms. You should receive instructions from your physician's office regarding next steps of care.  . When you arrive at healthcare provider, tell the healthcare staff immediately you have returned from visiting [place], [place], [place], [place] or [place]; or traveled in the  [place] to [place], [place], [place], or [place]; in the last two weeks or you have been in close contact with a person diagnosed with COVID-19 in the last 2 weeks.   . Tell the health care staff about your symptoms: fever, cough and shortness of breath. . After you have been seen by a medical provider, you will be either: o Tested for (COVID-19) and discharged home on quarantine except to seek medical care if symptoms worsen, and asked to  - Stay home and avoid contact with others until you get your results (4-5 days)  - Avoid travel on public transportation if possible (such as bus, train, or airplane) or o Sent to the Emergency Department by EMS for evaluation, COVID-19 testing, and possible admission depending on your condition and test results.  What to do if you are LOW RISK for COVID-19?  Reduce your risk of any infection by using the same precautions used for avoiding the common cold or flu:  Marland Kitchen Wash your hands often with soap and warm water for at least 20 seconds.  If soap and water are not readily available, use an alcohol-based hand sanitizer with at least 60% alcohol.  . If coughing or sneezing, cover your mouth and nose by coughing or sneezing into the elbow areas of your shirt or coat,  into a tissue or into your sleeve (not your hands). . Avoid shaking hands with others and consider head nods or verbal greetings only. . Avoid touching your eyes, nose, or mouth with unwashed hands.  . Avoid close contact with people who are sick. . Avoid places or events with large numbers of people in one location, like concerts or sporting events. . Carefully consider travel plans you have or are making. . If you are planning any travel outside or inside the [place], visit the CDC's Travelers' Health webpage for the latest health notices. . If you have some symptoms but not all symptoms, continue to monitor at home and seek medical attention if your symptoms worsen. . If you are having  a medical emergency, call 911.   [place] / e-Visit: [email or web address]         [facility]: [facility]: [id number]                   [facility]: [phone]

## 2019-05-07 NOTE — Progress Notes (Signed)
MD requesting outside films from [facility] dated 05/04/19. Order placed and notified Tedra Coupe in radiology of request.

## 2019-05-07 NOTE — Progress Notes (Signed)
Per Dr. Benay Spice: OK to treat today with Mg+ 1.4. Will give 2 grams IV today.

## 2019-05-07 NOTE — Progress Notes (Signed)
[place] OFFICE PROGRESS NOTE   Diagnosis: Colon cancer  INTERVAL HISTORY:   [name] returns as scheduled.  The abdominal wound is healing.  No fever.  She was seen at [facility] earlier this week.  She reports the [facility] team is okay with her resuming chemotherapy. She continues Keppra.  No more seizures.  Her skin is dry.  She discontinued doxycycline secondary to diarrhea.  She is using fluticasone ointment on the face.  The face rash has improved.  She has cracks over several fingers.  Objective:  Vital signs in last 24 hours:  Blood pressure 132/82, pulse 89, temperature 98.3 F (36.8 C), temperature source Temporal, resp. rate 18, height '5\' 6"'$  (1.676 m), weight 150 lb (68 kg), SpO2 100 %.   Resp: Lungs clear bilaterally Cardio: Regular rate and rhythm GI: No hepatosplenomegaly, the left lower quadrant wound remains open.  Gauze packing in place.  No erythema or discharge. Vascular: No leg edema  Skin: Mild pustular rash over the face.  Few pustular lesions over the trunk.  Linear ulcers at several fingertips.  Portacath/PICC-without erythema  Lab Results:  Lab Results  Component Value Date   WBC 3.0 (L) 05/07/2019   HGB 10.7 (L) 05/07/2019   HCT 34.7 (L) 05/07/2019   MCV 76.9 (L) 05/07/2019   PLT 151 05/07/2019   NEUTROABS 2.0 05/07/2019    CMP  Lab Results  Component Value Date   NA 145 05/07/2019   K 3.9 05/07/2019   CL 111 05/07/2019   CO2 21 (L) 05/07/2019   GLUCOSE 180 (H) 05/07/2019   BUN 9 05/07/2019   CREATININE 0.75 05/07/2019   CALCIUM 9.0 05/07/2019   PROT 6.7 05/07/2019   ALBUMIN 3.6 05/07/2019   AST 17 05/07/2019   ALT 11 05/07/2019   ALKPHOS 94 05/07/2019   BILITOT 0.4 05/07/2019   GFRNONAA >60 05/07/2019   GFRAA >60 05/07/2019    Lab Results  Component Value Date   CEA1 40.53 (H) 04/23/2019     Medications: I have reviewed the patient's current medications.   Assessment/Plan: 1. 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  The tumor returned microsatellite stable with no loss of mismatch repair protein expression   APC mutated. No BRAF, KRAS, or NRAS mutation On Foundation 1 testing   Cycle 1 adjuvant FOLFOX 09/08/2013   Cycle 2 adjuvant FOLFOX 09/24/2013   Cycle 3 adjuvant FOLFOX 10/08/2013.   Cycle 4 adjuvant FOLFOX 10/22/2013.   Cycle 5 adjuvant FOLFOX 11/05/2013. Oxaliplatin held due to thrombocytopenia.  Cycle 6 FOLFOX 11/19/2013.  Cycle 7 FOLFOX 12/03/2013. Oxaliplatin held secondary to thrombocytopenia.  Cycle 8 FOLFOX 12/17/2013.  Cycle 9 FOLFOX 01/04/2014. Oxaliplatin held secondary to neutropenia.   Cycle 10 FOLFOX 01/21/2014. Oxaliplatin held secondary to thrombocytopenia.  Cycle 11 FOLFOX 02/04/2014  Cycle 12 FOLFOX 02/18/2014, oxaliplatin dose reduced secondary tothrombocytopenia  CT abdomen/pelvis 01/30/2014 revealed splenomegaly and no evidence of recurrent colon cancer  CT chest 04/07/2014 with a stable right lower lobe nodule and no evidence for metastatic disease, no nodules seen on the CT 11/26/2014  Markedly elevated CEA 11/24/2014  CT 11/26/2014 revealed a right pelvic mass, splenomegaly, small volume ascites  Right salpingo-oophorectomy 12/28/2014 with the pathology confirming metastatic colon cancer  CTs 03/23/2016-no evidence of recurrent or metastatic disease.  CT 11/26/2016-enlargement of a fluid density structure the right pelvic sidewall, no other evidence of metastatic disease  CT aspiration right  pelvic cyst 12/19/2016.Cytology-BENIGN REACTIVE/REPARATIVE CHANGES.  CTs 06/05/2017-no evidence of metastatic disease, mild cirrhotic changes with splenomegaly  CTs 12/11/2017-recurrent cystic right adnexal mass, similar to on the CT 11/26/2016, stable mild splenomegaly  CTs 04/23/2018-enlargement of cystic right adnexal mass  with mural nodularity, no other evidence of metastatic disease  Cytoreductive surgery/HIPEC with mitomycin by Dr. Clovis Riley at [facility] 08/12/2018-R1 resection achieved. Cytoreduction included omentectomy, LAR, right salpingo-oophorectomy and left colonic gutter/pelvic stripping. Pathology on the rectum showed recurrent/metastatic adenocarcinoma, tumor 2.0 cm, predominantly involving the subserosa and muscularis propria of the colon, proximal and distal margins of resection were negative, vascular invasion present, metastatic carcinoma present in 1 out of 5 lymph nodes; omentum resection with no malignancy seen, no metastatic carcinoma identified in 1 lymph node examined; left gutter stripping positive metastatic adenocarcinoma; right ovary resection positive metastatic adenocarcinoma.  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  CEA 68 02/03/2019  Biopsy liver lesion 02/11/2019-metastatic adenocarcinoma consistent with primary colonic adenocarcinoma  CTs 02/27/2019-multiple liver lesions increased in size, new lesion in the lateral right lobe of the liver. No evidence of metastatic disease in the chest. Redemonstrated moderate left hydronephrosis and proximal hydroureter without discrete lesion or obstructing etiology at the transition point of the mid ureter.  Cycle 1 FOLFIRI/Panitumumab 03/12/2019  Cycle 2 FOLFIRI/Panitumumab 03/26/2019-bolus 5-FU and irinotecan held secondary to neutropenia  Cycle 3 FOLFIRI/Panitumumab 04/09/2019, Udenyca added-not given secondary to seizure/discontinuation of the 5-FU pump  CT abdomen/pelvis at [facility] 05/04/2019-no residual fluid collection at the left abdominal wall abscess, stable moderate left hydronephrosis, multifocal indeterminate liver lesions  Cycle 4 FOLFIRI/Panitumumab 05/07/2019, Udenyca 2. Mild elevation of the CEA beginning January 2016, normal on 05/19/2014   3. History of  iron deficiency anemia  4. seizure disorder; seizure 04/09/2019.  Brain CT negative.  Now on Keppra. 5. history of depression  6. 4 mm right lower lobe nodule on a staging chest CT 09/08/2013 , stable on a CT 04/07/2014 7. Hospitalization 09/24/2013 through 09/26/2013 with fever and abdominal pain.  8. 09/24/2013 urine culture positive for coag negative staph.  9. 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 Splenomegaly noted on a CT scan 01/30/2014,persistent on repeat CTs 12. Colonoscopy 11/17/2018-flexible sigmoidoscopy per rectum with changes of mild diversion colitis. Scope advanced for approximately 25 cm. Most proximal portion had necrotic appearing debris. Colostomy bag insufflated suggesting some type of communication between the pouch and the proximal colon. Introduction of scope into the ostomy found to available directions. 1 toward the distal pouch with similar appearing necrotic debris encountered. The other was about a 30 cm segment of normal-appearing colonic mucosa to the level of the previous right hemicolectomy ileocolonic anastomosis. 41. Port-A-Cath placement interventional radiology 03/05/2019 14. Possible abdominal wall abscess status post evaluation by surgery at [facility] 04/03/2019, antibiotics initiated; incision and drainage with purulent material removed 04/22/2019  CT at [facility] 05/04/2019-no fluid collection at the site of the left abdominal wall abscess, "indeterminate "liver lesions,     Disposition: Ms. Neidert appears well.  She has completed 3 cycles of salvage therapy with FOLFIRI/Panitumumab.  The third cycle was discontinued prematurely when she had a seizure while on the 5-FU pump.  She is now on Keppra and reports no further seizures.  She will complete another cycle of FOLFIRI/Panitumumab today.  She will receive G-CSF support with this cycle.  She will  continue the fluticasone ointment for the Panitumumab skin rash.  She will  use moisturizers.  We will request the CT images from [facility] for a direct comparison to the CT here in January.  The CEA has been lower since beginning treatment with FOLFIRI/Panitumumab.  Ms. Vandalen will return for an office visit and chemotherapy in 2 weeks.  Betsy Coder, MD  05/07/2019  11:20 AM

## 2019-05-07 NOTE — Progress Notes (Signed)
Received critical lab Mg 1.4 from lab.  Verbally communicated to J. Marvel Plan, LPN with repeat back @ 11:12am.

## 2019-05-08 ENCOUNTER — Telehealth: Payer: Self-pay | Admitting: Oncology

## 2019-05-08 NOTE — Telephone Encounter (Signed)
Scheduled per los. Called and left msg. Mailed printout  °

## 2019-05-09 ENCOUNTER — Other Ambulatory Visit: Payer: Self-pay

## 2019-05-09 ENCOUNTER — Inpatient Hospital Stay: Payer: BC Managed Care – PPO

## 2019-05-09 VITALS — BP 142/78 | HR 76 | Temp 99.1°F | Resp 18

## 2019-05-09 DIAGNOSIS — D6959 Other secondary thrombocytopenia: Secondary | ICD-10-CM | POA: Diagnosis not present

## 2019-05-09 DIAGNOSIS — C7961 Secondary malignant neoplasm of right ovary: Secondary | ICD-10-CM | POA: Diagnosis not present

## 2019-05-09 DIAGNOSIS — C182 Malignant neoplasm of ascending colon: Secondary | ICD-10-CM

## 2019-05-09 DIAGNOSIS — Z5189 Encounter for other specified aftercare: Secondary | ICD-10-CM | POA: Diagnosis not present

## 2019-05-09 DIAGNOSIS — Z5112 Encounter for antineoplastic immunotherapy: Secondary | ICD-10-CM | POA: Diagnosis not present

## 2019-05-09 DIAGNOSIS — Z452 Encounter for adjustment and management of vascular access device: Secondary | ICD-10-CM | POA: Diagnosis not present

## 2019-05-09 DIAGNOSIS — R21 Rash and other nonspecific skin eruption: Secondary | ICD-10-CM | POA: Diagnosis not present

## 2019-05-09 DIAGNOSIS — C787 Secondary malignant neoplasm of liver and intrahepatic bile duct: Secondary | ICD-10-CM | POA: Diagnosis not present

## 2019-05-09 DIAGNOSIS — D509 Iron deficiency anemia, unspecified: Secondary | ICD-10-CM | POA: Diagnosis not present

## 2019-05-09 DIAGNOSIS — Z5111 Encounter for antineoplastic chemotherapy: Secondary | ICD-10-CM | POA: Diagnosis not present

## 2019-05-09 MED ORDER — SODIUM CHLORIDE 0.9% FLUSH
10.0000 mL | INTRAVENOUS | Status: DC | PRN
  Administered 2019-05-09: 13:00:00 10 mL
  Filled 2019-05-09: qty 10

## 2019-05-09 MED ORDER — HEPARIN SOD (PORK) LOCK FLUSH 100 UNIT/ML IV SOLN
500.0000 [IU] | Freq: Once | INTRAVENOUS | Status: AC | PRN
  Administered 2019-05-09: 13:00:00 500 [IU]
  Filled 2019-05-09: qty 5

## 2019-05-09 MED ORDER — PEGFILGRASTIM-CBQV 6 MG/0.6ML ~~LOC~~ SOSY
6.0000 mg | PREFILLED_SYRINGE | Freq: Once | SUBCUTANEOUS | Status: AC
  Administered 2019-05-09: 13:00:00 6 mg via SUBCUTANEOUS

## 2019-05-09 NOTE — Patient Instructions (Signed)

## 2019-05-10 DIAGNOSIS — C182 Malignant neoplasm of ascending colon: Secondary | ICD-10-CM | POA: Diagnosis not present

## 2019-05-15 ENCOUNTER — Telehealth: Payer: Self-pay | Admitting: Nurse Practitioner

## 2019-05-15 NOTE — Progress Notes (Signed)
Pharmacist Chemotherapy Monitoring - Follow Up Assessment    I verify that I have reviewed each item in the below checklist:  . Regimen for the patient is scheduled for the appropriate day and plan matches scheduled date. Marland Kitchen Appropriate non-routine labs are ordered dependent on drug ordered. . If applicable, additional medications reviewed and ordered per protocol based on lifetime cumulative doses and/or treatment regimen.   Plan for follow-up and/or issues identified: No . I-vent associated with next due treatment: Yes . MD and/or nursing notified: No  Acquanetta Belling 05/15/2019 8:29 AM

## 2019-05-15 NOTE — Telephone Encounter (Signed)
[name] notified that recent CT scans show significant improvement in the liver.  She will follow-up as scheduled.  She requested clarification on Imodium instructions.  She is currently taking 2 mg approximately 3 times a day.  She can take up to 8 tablets a day.  She will contact the office if she has diarrhea despite this.

## 2019-05-17 ENCOUNTER — Other Ambulatory Visit: Payer: Self-pay | Admitting: Oncology

## 2019-05-20 ENCOUNTER — Inpatient Hospital Stay (HOSPITAL_BASED_OUTPATIENT_CLINIC_OR_DEPARTMENT_OTHER): Payer: BC Managed Care – PPO | Admitting: Nurse Practitioner

## 2019-05-20 ENCOUNTER — Inpatient Hospital Stay: Payer: BC Managed Care – PPO

## 2019-05-20 ENCOUNTER — Other Ambulatory Visit: Payer: Self-pay

## 2019-05-20 ENCOUNTER — Encounter: Payer: Self-pay | Admitting: Nurse Practitioner

## 2019-05-20 VITALS — BP 129/75 | HR 67 | Temp 98.7°F | Resp 17 | Ht 66.0 in | Wt 146.4 lb

## 2019-05-20 DIAGNOSIS — Z5189 Encounter for other specified aftercare: Secondary | ICD-10-CM | POA: Diagnosis not present

## 2019-05-20 DIAGNOSIS — D6959 Other secondary thrombocytopenia: Secondary | ICD-10-CM | POA: Diagnosis not present

## 2019-05-20 DIAGNOSIS — Z452 Encounter for adjustment and management of vascular access device: Secondary | ICD-10-CM | POA: Diagnosis not present

## 2019-05-20 DIAGNOSIS — C182 Malignant neoplasm of ascending colon: Secondary | ICD-10-CM

## 2019-05-20 DIAGNOSIS — Z5112 Encounter for antineoplastic immunotherapy: Secondary | ICD-10-CM | POA: Diagnosis not present

## 2019-05-20 DIAGNOSIS — C7961 Secondary malignant neoplasm of right ovary: Secondary | ICD-10-CM | POA: Diagnosis not present

## 2019-05-20 DIAGNOSIS — C787 Secondary malignant neoplasm of liver and intrahepatic bile duct: Secondary | ICD-10-CM | POA: Diagnosis not present

## 2019-05-20 DIAGNOSIS — D509 Iron deficiency anemia, unspecified: Secondary | ICD-10-CM | POA: Diagnosis not present

## 2019-05-20 DIAGNOSIS — R21 Rash and other nonspecific skin eruption: Secondary | ICD-10-CM | POA: Diagnosis not present

## 2019-05-20 DIAGNOSIS — Z5111 Encounter for antineoplastic chemotherapy: Secondary | ICD-10-CM | POA: Diagnosis not present

## 2019-05-20 LAB — CMP (CANCER CENTER ONLY)
ALT: 13 U/L (ref 0–44)
AST: 15 U/L (ref 15–41)
Albumin: 3.7 g/dL (ref 3.5–5.0)
Alkaline Phosphatase: 127 U/L — ABNORMAL HIGH (ref 38–126)
Anion gap: 9 (ref 5–15)
BUN: 8 mg/dL (ref 6–20)
CO2: 26 mmol/L (ref 22–32)
Calcium: 9 mg/dL (ref 8.9–10.3)
Chloride: 109 mmol/L (ref 98–111)
Creatinine: 0.74 mg/dL (ref 0.44–1.00)
GFR, Est AFR Am: >60 mL/min (ref >60)
GFR, Estimated: >60 mL/min (ref >60)
Glucose, Bld: 186 mg/dL — ABNORMAL HIGH (ref 70–99)
Potassium: 3.6 mmol/L (ref 3.5–5.1)
Sodium: 144 mmol/L (ref 135–145)
Total Bilirubin: 0.7 mg/dL (ref 0.3–1.2)
Total Protein: 6.6 g/dL (ref 6.5–8.1)

## 2019-05-20 LAB — CBC WITH DIFFERENTIAL (CANCER CENTER ONLY)
Abs Immature Granulocytes: 0.18 10*3/uL — ABNORMAL HIGH (ref 0.00–0.07)
Basophils Absolute: 0 10*3/uL (ref 0.0–0.1)
Basophils Relative: 0 %
Eosinophils Absolute: 0.1 10*3/uL (ref 0.0–0.5)
Eosinophils Relative: 2 %
HCT: 34.9 % — ABNORMAL LOW (ref 36.0–46.0)
Hemoglobin: 10.7 g/dL — ABNORMAL LOW (ref 12.0–15.0)
Immature Granulocytes: 4 %
Lymphocytes Relative: 18 %
Lymphs Abs: 0.9 10*3/uL (ref 0.7–4.0)
MCH: 23.7 pg — ABNORMAL LOW (ref 26.0–34.0)
MCHC: 30.7 g/dL (ref 30.0–36.0)
MCV: 77.4 fL — ABNORMAL LOW (ref 80.0–100.0)
Monocytes Absolute: 0.3 10*3/uL (ref 0.1–1.0)
Monocytes Relative: 5 %
Neutro Abs: 3.5 10*3/uL (ref 1.7–7.7)
Neutrophils Relative %: 71 %
Platelet Count: 91 10*3/uL — ABNORMAL LOW (ref 150–400)
RBC: 4.51 MIL/uL (ref 3.87–5.11)
RDW: 17.7 % — ABNORMAL HIGH (ref 11.5–15.5)
WBC Count: 4.9 10*3/uL (ref 4.0–10.5)
nRBC: 0 % (ref 0.0–0.2)

## 2019-05-20 LAB — MAGNESIUM: Magnesium: 1.3 mg/dL — CL (ref 1.7–2.4)

## 2019-05-20 LAB — CEA (IN HOUSE-CHCC): CEA (CHCC-In House): 22.43 ng/mL — ABNORMAL HIGH (ref 0.00–5.00)

## 2019-05-20 MED ORDER — PALONOSETRON HCL INJECTION 0.25 MG/5ML
0.2500 mg | Freq: Once | INTRAVENOUS | Status: AC
  Administered 2019-05-20: 0.25 mg via INTRAVENOUS

## 2019-05-20 MED ORDER — SODIUM CHLORIDE 0.9 % IV SOLN
6.0000 mg/kg | Freq: Once | INTRAVENOUS | Status: AC
  Administered 2019-05-20: 400 mg via INTRAVENOUS
  Filled 2019-05-20: qty 20

## 2019-05-20 MED ORDER — MAGNESIUM SULFATE 4 GM/100ML IV SOLN
4.0000 g | Freq: Once | INTRAVENOUS | Status: AC
  Administered 2019-05-20: 4 g via INTRAVENOUS
  Filled 2019-05-20: qty 100

## 2019-05-20 MED ORDER — ATROPINE SULFATE 1 MG/ML IJ SOLN
INTRAMUSCULAR | Status: AC
  Filled 2019-05-20: qty 1

## 2019-05-20 MED ORDER — FLUTICASONE PROPIONATE 0.05 % EX CREA: TOPICAL_CREAM | Freq: Two times a day (BID) | CUTANEOUS | 2 refills | Status: DC

## 2019-05-20 MED ORDER — SODIUM CHLORIDE 0.9 % IV SOLN
10.0000 mg | Freq: Once | INTRAVENOUS | Status: AC
  Administered 2019-05-20: 10 mg via INTRAVENOUS
  Filled 2019-05-20: qty 10

## 2019-05-20 MED ORDER — DIPHENOXYLATE-ATROPINE 2.5-0.025 MG PO TABS: 2.0000 | ORAL_TABLET | Freq: Four times a day (QID) | ORAL | 0 refills | Status: DC | PRN

## 2019-05-20 MED ORDER — FLUOROURACIL CHEMO INJECTION 2.5 GM/50ML
400.0000 mg/m2 | Freq: Once | INTRAVENOUS | Status: AC
  Administered 2019-05-20: 700 mg via INTRAVENOUS
  Filled 2019-05-20: qty 14

## 2019-05-20 MED ORDER — MAGNESIUM OXIDE 400 (241.3 MG) MG PO TABS: 400.0000 mg | ORAL_TABLET | Freq: Two times a day (BID) | ORAL | 2 refills | Status: DC

## 2019-05-20 MED ORDER — PALONOSETRON HCL INJECTION 0.25 MG/5ML
INTRAVENOUS | Status: AC
  Filled 2019-05-20: qty 5

## 2019-05-20 MED ORDER — SODIUM CHLORIDE 0.9 % IV SOLN
Freq: Once | INTRAVENOUS | Status: AC
  Filled 2019-05-20: qty 250

## 2019-05-20 MED ORDER — SODIUM CHLORIDE 0.9 % IV SOLN
400.0000 mg/m2 | Freq: Once | INTRAVENOUS | Status: AC
  Administered 2019-05-20: 716 mg via INTRAVENOUS
  Filled 2019-05-20: qty 35.8

## 2019-05-20 MED ORDER — ATROPINE SULFATE 1 MG/ML IJ SOLN
0.5000 mg | Freq: Once | INTRAMUSCULAR | Status: AC | PRN
  Administered 2019-05-20: 0.5 mg via INTRAVENOUS

## 2019-05-20 MED ORDER — NYSTATIN 100000 UNIT/GM EX POWD: 1.0000 "application " | Freq: Three times a day (TID) | CUTANEOUS | 1 refills | Status: DC

## 2019-05-20 MED ORDER — SODIUM CHLORIDE 0.9 % IV SOLN
180.0000 mg/m2 | Freq: Once | INTRAVENOUS | Status: AC
  Administered 2019-05-20: 320 mg via INTRAVENOUS
  Filled 2019-05-20: qty 15

## 2019-05-20 MED ORDER — SODIUM CHLORIDE 0.9 % IV SOLN
2400.0000 mg/m2 | INTRAVENOUS | Status: DC
  Administered 2019-05-20: 4300 mg via INTRAVENOUS
  Filled 2019-05-20: qty 86

## 2019-05-20 NOTE — Progress Notes (Signed)
Per Lattie Haw, give magnesium 4gm IV once today in infusion for level of 1.3. Patient will also be started on oral magnesium replacement at home.   Demetrius Charity, PharmD, BCPS, [facility] Pharmacist Pharmacy Phone: [phone] 05/20/2019

## 2019-05-20 NOTE — Progress Notes (Signed)
Verbal order from Lattie Haw, NP: okay to treat pt. With Plt. of 91.

## 2019-05-20 NOTE — Patient Instructions (Signed)

## 2019-05-20 NOTE — Progress Notes (Addendum)
[facility] OFFICE PROGRESS NOTE   Diagnosis: Colon cancer  INTERVAL HISTORY:   [name] returns as scheduled.  She completed cycle 4 FOLFIRI/Panitumumab 05/07/2019.  She had mild nausea.  She had mouth sores for a few days.  She utilized Magic mouthwash.  She had diarrhea beginning day 1 and lasting intermittently for 8 to 9 days.  She took up to 6 Imodium a day.  The diarrhea has resolved.  She is noting hair loss.  Some fatigue.  Skin rash is stable.  Objective:  Vital signs in last 24 hours:  Blood pressure 129/75, pulse 67, temperature 98.7 F (37.1 C), temperature source Axillary, resp. rate 17, height 5' 6" (1.676 m), weight 146 lb 6.4 oz (66.4 kg), SpO2 99 %.    HEENT: No thrush.  Small healing areas of ulceration lower inner lip. Resp: Lungs clear bilaterally. Cardio: Regular rate and rhythm. GI: No hepatomegaly.  Left lower quadrant wound is nearly healed.  Marked erythema surrounding the previous wound. Vascular: No leg edema.  Skin: Mild pustular rash scattered over the face and trunk.  Linear ulcers at several fingertips. Port-A-Cath without erythema.   Lab Results:  Lab Results  Component Value Date   WBC 4.9 05/20/2019   HGB 10.7 (L) 05/20/2019   HCT 34.9 (L) 05/20/2019   MCV 77.4 (L) 05/20/2019   PLT 91 (L) 05/20/2019   NEUTROABS 3.5 05/20/2019    Imaging:  No results found.  Medications: I have reviewed the patient's current medications.  Assessment/Plan: 1. 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  The tumor returned microsatellite stable with no loss of mismatch repair protein expression   APC mutated. No BRAF, KRAS, or NRAS mutation On Foundation 1 testing   Cycle 1 adjuvant FOLFOX 09/08/2013   Cycle 2 adjuvant FOLFOX 09/24/2013   Cycle 3 adjuvant FOLFOX 10/08/2013.   Cycle 4 adjuvant FOLFOX 10/22/2013.   Cycle 5  adjuvant FOLFOX 11/05/2013. Oxaliplatin held due to thrombocytopenia.  Cycle 6 FOLFOX 11/19/2013.  Cycle 7 FOLFOX 12/03/2013. Oxaliplatin held secondary to thrombocytopenia.  Cycle 8 FOLFOX 12/17/2013.  Cycle 9 FOLFOX 01/04/2014. Oxaliplatin held secondary to neutropenia.   Cycle 10 FOLFOX 01/21/2014. Oxaliplatin held secondary to thrombocytopenia.  Cycle 11 FOLFOX 02/04/2014  Cycle 12 FOLFOX 02/18/2014, oxaliplatin dose reduced secondary tothrombocytopenia  CT abdomen/pelvis 01/30/2014 revealed splenomegaly and no evidence of recurrent colon cancer  CT chest 04/07/2014 with a stable right lower lobe nodule and no evidence for metastatic disease, no nodules seen on the CT 11/26/2014  Markedly elevated CEA 11/24/2014  CT 11/26/2014 revealed a right pelvic mass, splenomegaly, small volume ascites  Right salpingo-oophorectomy 12/28/2014 with the pathology confirming metastatic colon cancer  CTs 03/23/2016-no evidence of recurrent or metastatic disease.  CT 11/26/2016-enlargement of a fluid density structure the right pelvic sidewall, no other evidence of metastatic disease  CT aspiration right pelvic cyst 12/19/2016.Cytology-BENIGN REACTIVE/REPARATIVE CHANGES.  CTs 06/05/2017-no evidence of metastatic disease, mild cirrhotic changes with splenomegaly  CTs 12/11/2017-recurrent cystic right adnexal mass, similar to on the CT 11/26/2016, stable mild splenomegaly  CTs 04/23/2018-enlargement of cystic right adnexal mass with mural nodularity, no other evidence of metastatic disease  Cytoreductive surgery/HIPEC with mitomycin by Dr. Levine at [facility] 08/12/2018-R1 resection achieved. Cytoreduction included omentectomy, LAR, right salpingo-oophorectomy and left colonic gutter/pelvic stripping. Pathology on the rectum showed recurrent/metastatic adenocarcinoma, tumor 2.0 cm, predominantly involving the subserosa and muscularis propria of the colon, proximal and distal margins   of  resection were negative, vascular invasion present, metastatic carcinoma present in 1 out of 5 lymph nodes; omentum resection with no malignancy seen, no metastatic carcinoma identified in 1 lymph node examined; left gutter stripping positive metastatic adenocarcinoma; right ovary resection positive metastatic adenocarcinoma.  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  CEA 68 02/03/2019  Biopsy liver lesion 02/11/2019-metastatic adenocarcinoma consistent with primary colonic adenocarcinoma  CTs 02/27/2019-multiple liver lesions increased in size, new lesion in the lateral right lobe of the liver. No evidence of metastatic disease in the chest. Redemonstrated moderate left hydronephrosis and proximal hydroureter without discrete lesion or obstructing etiology at the transition point of the mid ureter.  Cycle 1 FOLFIRI/Panitumumab 03/12/2019  Cycle 2 FOLFIRI/Panitumumab 03/26/2019-bolus 5-FU and irinotecan held secondary to neutropenia  Cycle 3 FOLFIRI/Panitumumab 04/09/2019, Udenyca added-not given secondary to seizure/discontinuation of the 5-FU pump  CT abdomen[facility]pelvis at [facility] 05/04/2019-no residual fluid collection at the left abdominal wall abscess, stable moderate left hydronephrosis, multifocal indeterminate liver lesions--liver lesions significantly improved  Cycle 4 FOLFIRI/Panitumumab 05/07/2019, Udenyca  Cycle 5 FOLFIRI/Panitumumab 05/20/2019, Udenyca 2. Mild elevation of the CEA beginning January 2016, normal on 05/19/2014   3. History of iron deficiency anemia  4. seizure disorder; seizure 04/09/2019.  Brain CT negative.  Now on Keppra. 5. history of depression  6. 4 mm right lower lobe nodule on a staging chest CT 09/08/2013 , stable on a CT 04/07/2014 7. Hospitalization 09/24/2013 through 09/26/2013 with fever and abdominal pain.  8. 09/24/2013 urine culture positive for coag negative staph.   9. 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 Splenomegaly noted on a CT scan 01/30/2014,persistent on repeat CTs 12. Colonoscopy 11/17/2018-flexible sigmoidoscopy per rectum with changes of mild diversion colitis. Scope advanced for approximately 25 cm. Most proximal portion had necrotic appearing debris. Colostomy bag insufflated suggesting some type of communication between the pouch and the proximal colon. Introduction of scope into the ostomy found to available directions. 1 toward the distal pouch with similar appearing necrotic debris encountered. The other was about a 30 cm segment of normal-appearing colonic mucosa to the level of the previous right hemicolectomy ileocolonic anastomosis. 37. Port-A-Cath placement interventional radiology 03/05/2019 14. Possible abdominal wall abscess status post evaluation by surgery at [facility] 04/03/2019, antibiotics initiated; incision and drainage with purulent material removed 04/22/2019  CT at [facility] 05/04/2019-no fluid collection at the site of the left abdominal wall abscess, "indeterminate " liver lesions  Disposition: Ms. Sparlin appears stable.  She has completed 4 cycles of FOLFIRI/Panitumumab.  Plan to proceed with cycle 5 today as scheduled.  We reviewed the labs from today.  Blood counts adequate for treatment.  She has mild to moderate thrombocytopenia.  She understands to contact the office with any bleeding.  Irinotecan will be dose reduced with the next cycle if the platelet count is lower in 2 weeks.  She has hypomagnesemia secondary to Panitumumab and diarrhea.  She will receive IV magnesium today and will begin magnesium oxide 400 mg twice daily.    She had diarrhea after cycle 4.  She was able to control the diarrhea by increasing frequency of Imodium.  Prescription sent to her pharmacy for Lomotil to use as well.  She understands to contact  the office with poorly controlled diarrhea.  The rash surrounding the healing abdominal wound may be fungal.  Prescription sent to her pharmacy for nystatin powder.  She will return for lab,  follow-up, cycle 6 FOLFIRI/Panitumumab in 2 weeks.  She will contact the office in the interim as outlined above or with any other problems.  Patient seen with Dr. Benay Spice.  CT images reviewed on the computer with Ms. Owens Shark.    Ned Card ANP/GNP-BC   05/20/2019  10:16 AM This was a shared visit with Ned Card.  Ms. Dusenbury was interviewed and examined.  We reviewed the [facility] CT images with her.  The liver lesions appear improved.  The CEA is lower.  She appears to be responding to the FOLFIRI/Panitumumab.  She will complete another cycle today.  Julieanne Manson, MD

## 2019-05-20 NOTE — Patient Instructions (Signed)
[facility] Discharge Instructions for Patients Receiving Chemotherapy  Today you received the following chemotherapy agents Panitumumab (VECTIBIX), Irinotecan (CAMPTOSAR), Leucovorin & Flourouracil (ADRUCIL).  To help prevent nausea and vomiting after your treatment, we encourage you to take your nausea medication as prescribed.  If you develop nausea and vomiting that is not controlled by your nausea medication, call the clinic.   BELOW ARE SYMPTOMS THAT SHOULD BE REPORTED IMMEDIATELY:  *FEVER GREATER THAN 100.5 F  *CHILLS WITH OR WITHOUT FEVER  NAUSEA AND VOMITING THAT IS NOT CONTROLLED WITH YOUR NAUSEA MEDICATION  *UNUSUAL SHORTNESS OF BREATH  *UNUSUAL BRUISING OR BLEEDING  TENDERNESS IN MOUTH AND THROAT WITH OR WITHOUT PRESENCE OF ULCERS  *URINARY PROBLEMS  *BOWEL PROBLEMS  UNUSUAL RASH Items with * indicate a potential emergency and should be followed up as soon as possible.  Feel free to call the clinic should you have any questions or concerns. The clinic phone number is [phone].  Please show the [place] at check-in to the Emergency Department and triage nurse.  Magnesium Sulfate injection What is this medicine? MAGNESIUM SULFATE (mag NEE zee um SUL fate) is an electrolyte injection commonly used to treat low magnesium levels in your blood. It is also used to prevent or control seizures in women with preeclampsia or eclampsia. This medicine may be used for other purposes; ask your health care provider or pharmacist if you have questions. What should I tell my health care provider before I take this medicine? They need to know if you have any of these conditions:  heart disease  history of irregular heart beat  kidney disease  an unusual or allergic reaction to magnesium sulfate, medicines, foods, dyes, or preservatives  pregnant or trying to get pregnant  breast-feeding How should I use this medicine? This medicine is for  infusion into a vein. It is given by a health care professional in a hospital or clinic setting. Talk to your pediatrician regarding the use of this medicine in children. While this drug may be prescribed for selected conditions, precautions do apply. Overdosage: If you think you have taken too much of this medicine contact a poison control center or emergency room at once. NOTE: This medicine is only for you. Do not share this medicine with others. What if I miss a dose? This does not apply. What may interact with this medicine? This medicine may interact with the following medications:  certain medicines for anxiety or sleep  certain medicines for seizures like phenobarbital  digoxin  medicines that relax muscles for surgery  narcotic medicines for pain This list may not describe all possible interactions. Give your health care provider a list of all the medicines, herbs, non-prescription drugs, or dietary supplements you use. Also tell them if you smoke, drink alcohol, or use illegal drugs. Some items may interact with your medicine. What should I watch for while using this medicine? Your condition will be monitored carefully while you are receiving this medicine. You may need blood work done while you are receiving this medicine. What side effects may I notice from receiving this medicine? Side effects that you should report to your doctor or health care professional as soon as possible:  allergic reactions like skin rash, itching or hives, swelling of the face, lips, or tongue  facial flushing  muscle weakness  signs and symptoms of low blood pressure like dizziness; feeling faint or lightheaded, falls; unusually weak or tired  signs and symptoms of a dangerous  change in heartbeat or heart rhythm like chest pain; dizziness; fast or irregular heartbeat; palpitations; breathing problems  sweating This list may not describe all possible side effects. Call your doctor for medical  advice about side effects. You may report side effects to FDA at 1-800-FDA-1088. Where should I keep my medicine? This drug is given in a hospital or clinic and will not be stored at home. NOTE: This sheet is a summary. It may not cover all possible information. If you have questions about this medicine, talk to your doctor, pharmacist, or health care provider.  2020 Elsevier/Gold Standard (2015-08-10 12:31:42)

## 2019-05-22 ENCOUNTER — Other Ambulatory Visit: Payer: Self-pay

## 2019-05-22 ENCOUNTER — Inpatient Hospital Stay: Payer: BC Managed Care – PPO

## 2019-05-22 VITALS — BP 100/72 | HR 77 | Temp 98.2°F | Resp 18

## 2019-05-22 DIAGNOSIS — D6959 Other secondary thrombocytopenia: Secondary | ICD-10-CM | POA: Diagnosis not present

## 2019-05-22 DIAGNOSIS — D509 Iron deficiency anemia, unspecified: Secondary | ICD-10-CM | POA: Diagnosis not present

## 2019-05-22 DIAGNOSIS — Z5112 Encounter for antineoplastic immunotherapy: Secondary | ICD-10-CM | POA: Diagnosis not present

## 2019-05-22 DIAGNOSIS — C182 Malignant neoplasm of ascending colon: Secondary | ICD-10-CM

## 2019-05-22 DIAGNOSIS — R21 Rash and other nonspecific skin eruption: Secondary | ICD-10-CM | POA: Diagnosis not present

## 2019-05-22 DIAGNOSIS — Z452 Encounter for adjustment and management of vascular access device: Secondary | ICD-10-CM | POA: Diagnosis not present

## 2019-05-22 DIAGNOSIS — Z5111 Encounter for antineoplastic chemotherapy: Secondary | ICD-10-CM | POA: Diagnosis not present

## 2019-05-22 DIAGNOSIS — C7961 Secondary malignant neoplasm of right ovary: Secondary | ICD-10-CM | POA: Diagnosis not present

## 2019-05-22 DIAGNOSIS — Z5189 Encounter for other specified aftercare: Secondary | ICD-10-CM | POA: Diagnosis not present

## 2019-05-22 DIAGNOSIS — C787 Secondary malignant neoplasm of liver and intrahepatic bile duct: Secondary | ICD-10-CM | POA: Diagnosis not present

## 2019-05-22 MED ORDER — PEGFILGRASTIM-CBQV 6 MG/0.6ML ~~LOC~~ SOSY
PREFILLED_SYRINGE | SUBCUTANEOUS | Status: AC
  Filled 2019-05-22: qty 0.6

## 2019-05-22 MED ORDER — PEGFILGRASTIM-CBQV 6 MG/0.6ML ~~LOC~~ SOSY
6.0000 mg | PREFILLED_SYRINGE | Freq: Once | SUBCUTANEOUS | Status: AC
  Administered 2019-05-22: 6 mg via SUBCUTANEOUS

## 2019-05-22 MED ORDER — SODIUM CHLORIDE 0.9% FLUSH
10.0000 mL | INTRAVENOUS | Status: DC | PRN
  Administered 2019-05-22: 10 mL
  Filled 2019-05-22: qty 10

## 2019-05-22 MED ORDER — HEPARIN SOD (PORK) LOCK FLUSH 100 UNIT/ML IV SOLN
500.0000 [IU] | Freq: Once | INTRAVENOUS | Status: AC | PRN
  Administered 2019-05-22: 14:00:00 500 [IU]
  Filled 2019-05-22: qty 5

## 2019-05-22 NOTE — Patient Instructions (Signed)

## 2019-05-27 ENCOUNTER — Other Ambulatory Visit: Payer: Self-pay | Admitting: Oncology

## 2019-05-29 NOTE — Progress Notes (Signed)
Pharmacist Chemotherapy Monitoring - Follow Up Assessment    I verify that I have reviewed each item in the below checklist:  . Regimen for the patient is scheduled for the appropriate day and plan matches scheduled date. Marland Kitchen Appropriate non-routine labs are ordered dependent on drug ordered. . If applicable, additional medications reviewed and ordered per protocol based on lifetime cumulative doses and/or treatment regimen.   Plan for follow-up and/or issues identified: No . I-vent associated with next due treatment: Yes . MD and/or nursing notified: No  Acquanetta Belling 05/29/2019 4:44 PM

## 2019-06-01 ENCOUNTER — Other Ambulatory Visit: Payer: Self-pay | Admitting: Nurse Practitioner

## 2019-06-01 DIAGNOSIS — C182 Malignant neoplasm of ascending colon: Secondary | ICD-10-CM

## 2019-06-01 DIAGNOSIS — C787 Secondary malignant neoplasm of liver and intrahepatic bile duct: Secondary | ICD-10-CM

## 2019-06-01 MED ORDER — DIPHENOXYLATE-ATROPINE 2.5-0.025 MG PO TABS: 2.0000 | ORAL_TABLET | Freq: Four times a day (QID) | ORAL | 0 refills | Status: DC | PRN

## 2019-06-03 ENCOUNTER — Encounter: Payer: Self-pay | Admitting: Oncology

## 2019-06-03 ENCOUNTER — Telehealth: Payer: Self-pay | Admitting: *Deleted

## 2019-06-03 NOTE — Telephone Encounter (Signed)
Called and reports the prior fistula has now opened up to about 2cm and draining dark stool-looking discharge that smells like stool. Soaking through the gauzes she puts over it. Asking if we have any bags she can apply to catch the drainage. Was able to locate some ostomy wafers and bags and left up front for her to pick up. She has contacted her surgeon at [facility] for appointment to see surgeon and not the PA. Feels the PA was not sympathetic to her needs.

## 2019-06-04 ENCOUNTER — Inpatient Hospital Stay: Payer: BC Managed Care – PPO

## 2019-06-04 ENCOUNTER — Other Ambulatory Visit: Payer: Self-pay

## 2019-06-04 ENCOUNTER — Inpatient Hospital Stay (HOSPITAL_BASED_OUTPATIENT_CLINIC_OR_DEPARTMENT_OTHER): Payer: BC Managed Care – PPO | Admitting: Nurse Practitioner

## 2019-06-04 ENCOUNTER — Encounter: Payer: Self-pay | Admitting: Nurse Practitioner

## 2019-06-04 VITALS — BP 102/68 | HR 70 | Temp 98.2°F | Resp 16 | Ht 66.0 in | Wt 142.2 lb

## 2019-06-04 DIAGNOSIS — C787 Secondary malignant neoplasm of liver and intrahepatic bile duct: Secondary | ICD-10-CM | POA: Diagnosis not present

## 2019-06-04 DIAGNOSIS — Z95828 Presence of other vascular implants and grafts: Secondary | ICD-10-CM | POA: Insufficient documentation

## 2019-06-04 DIAGNOSIS — D509 Iron deficiency anemia, unspecified: Secondary | ICD-10-CM | POA: Diagnosis not present

## 2019-06-04 DIAGNOSIS — K632 Fistula of intestine: Secondary | ICD-10-CM | POA: Diagnosis not present

## 2019-06-04 DIAGNOSIS — Z5112 Encounter for antineoplastic immunotherapy: Secondary | ICD-10-CM | POA: Diagnosis not present

## 2019-06-04 DIAGNOSIS — L02211 Cutaneous abscess of abdominal wall: Secondary | ICD-10-CM | POA: Diagnosis not present

## 2019-06-04 DIAGNOSIS — C182 Malignant neoplasm of ascending colon: Secondary | ICD-10-CM

## 2019-06-04 DIAGNOSIS — Z5189 Encounter for other specified aftercare: Secondary | ICD-10-CM | POA: Diagnosis not present

## 2019-06-04 DIAGNOSIS — C7961 Secondary malignant neoplasm of right ovary: Secondary | ICD-10-CM | POA: Diagnosis not present

## 2019-06-04 DIAGNOSIS — Z452 Encounter for adjustment and management of vascular access device: Secondary | ICD-10-CM | POA: Diagnosis not present

## 2019-06-04 DIAGNOSIS — Z5111 Encounter for antineoplastic chemotherapy: Secondary | ICD-10-CM | POA: Diagnosis not present

## 2019-06-04 DIAGNOSIS — R21 Rash and other nonspecific skin eruption: Secondary | ICD-10-CM | POA: Diagnosis not present

## 2019-06-04 DIAGNOSIS — D6959 Other secondary thrombocytopenia: Secondary | ICD-10-CM | POA: Diagnosis not present

## 2019-06-04 LAB — CBC WITH DIFFERENTIAL (CANCER CENTER ONLY)
Abs Immature Granulocytes: 0.58 10*3/uL — ABNORMAL HIGH (ref 0.00–0.07)
Basophils Absolute: 0 10*3/uL (ref 0.0–0.1)
Basophils Relative: 1 %
Eosinophils Absolute: 0.1 10*3/uL (ref 0.0–0.5)
Eosinophils Relative: 1 %
HCT: 35.7 % — ABNORMAL LOW (ref 36.0–46.0)
Hemoglobin: 10.9 g/dL — ABNORMAL LOW (ref 12.0–15.0)
Immature Granulocytes: 7 %
Lymphocytes Relative: 13 %
Lymphs Abs: 1.1 10*3/uL (ref 0.7–4.0)
MCH: 24.4 pg — ABNORMAL LOW (ref 26.0–34.0)
MCHC: 30.5 g/dL (ref 30.0–36.0)
MCV: 80 fL (ref 80.0–100.0)
Monocytes Absolute: 0.4 10*3/uL (ref 0.1–1.0)
Monocytes Relative: 5 %
Neutro Abs: 6.2 10*3/uL (ref 1.7–7.7)
Neutrophils Relative %: 73 %
Platelet Count: 152 10*3/uL (ref 150–400)
RBC: 4.46 MIL/uL (ref 3.87–5.11)
RDW: 18.3 % — ABNORMAL HIGH (ref 11.5–15.5)
WBC Count: 8.4 10*3/uL (ref 4.0–10.5)
nRBC: 0 % (ref 0.0–0.2)

## 2019-06-04 LAB — CMP (CANCER CENTER ONLY)
ALT: 13 U/L (ref 0–44)
AST: 17 U/L (ref 15–41)
Albumin: 3.7 g/dL (ref 3.5–5.0)
Alkaline Phosphatase: 125 U/L (ref 38–126)
Anion gap: 8 (ref 5–15)
BUN: 12 mg/dL (ref 6–20)
CO2: 25 mmol/L (ref 22–32)
Calcium: 9.4 mg/dL (ref 8.9–10.3)
Chloride: 108 mmol/L (ref 98–111)
Creatinine: 0.71 mg/dL (ref 0.44–1.00)
GFR, Est AFR Am: >60 mL/min (ref >60)
GFR, Estimated: >60 mL/min (ref >60)
Glucose, Bld: 137 mg/dL — ABNORMAL HIGH (ref 70–99)
Potassium: 4.2 mmol/L (ref 3.5–5.1)
Sodium: 141 mmol/L (ref 135–145)
Total Bilirubin: 0.8 mg/dL (ref 0.3–1.2)
Total Protein: 6.9 g/dL (ref 6.5–8.1)

## 2019-06-04 LAB — MAGNESIUM: Magnesium: 1.6 mg/dL — ABNORMAL LOW (ref 1.7–2.4)

## 2019-06-04 LAB — CEA (IN HOUSE-CHCC): CEA (CHCC-In House): 36.12 ng/mL — ABNORMAL HIGH (ref 0.00–5.00)

## 2019-06-04 MED ORDER — SODIUM CHLORIDE 0.9% FLUSH
10.0000 mL | Freq: Once | INTRAVENOUS | Status: AC
  Administered 2019-06-04: 10 mL
  Filled 2019-06-04: qty 10

## 2019-06-04 MED ORDER — SODIUM CHLORIDE 0.9% FLUSH
10.0000 mL | INTRAVENOUS | Status: DC | PRN
  Administered 2019-06-04: 10 mL via INTRAVENOUS
  Filled 2019-06-04: qty 10

## 2019-06-04 MED ORDER — HEPARIN SOD (PORK) LOCK FLUSH 100 UNIT/ML IV SOLN
500.0000 [IU] | Freq: Once | INTRAVENOUS | Status: AC
  Administered 2019-06-04: 500 [IU]
  Filled 2019-06-04: qty 5

## 2019-06-04 NOTE — Progress Notes (Addendum)
[place] OFFICE PROGRESS NOTE   Diagnosis: Colon cancer  INTERVAL HISTORY:   [name] returns as scheduled.  She completed cycle 5 FOLFIRI/Panitumumab 05/20/2019.  She contacted the office yesterday to report a previous abdominal wound had opened up to about 2 cm and was draining a foul-smelling discharge.  She has an appointment with surgery at [facility] later today.  She tolerated the chemotherapy well.  No nausea or vomiting.  Diarrhea was controlled with Lomotil.  She had a few small mouth sores.  Skin rash is stable.  She had a single fever 2 days ago coinciding with opening of the abdominal wound.  Stool is now draining from the wound.  The skin surrounding the wound is painful which she relates to the bag covering the wound.  Objective:  Vital signs in last 24 hours:  Blood pressure 102/68, pulse 70, temperature 98.2 F (36.8 C), temperature source Temporal, resp. rate 16, height 5' 6"  (1.676 m), weight 142 lb 3.2 oz (64.5 kg), SpO2 100 %.    HEENT: No thrush or ulcers. GI: No hepatomegaly.  Left abdomen wound covered with an ostomy bag.  What appears to be stool is present in the collection bag. Vascular: No leg edema. Skin: Palms without erythema.  Acne type skin rash lower face.  Mild rash on the back. Port-A-Cath without erythema.   Lab Results:  Lab Results  Component Value Date   WBC 8.4 06/04/2019   HGB 10.9 (L) 06/04/2019   HCT 35.7 (L) 06/04/2019   MCV 80.0 06/04/2019   PLT 152 06/04/2019   NEUTROABS PENDING 06/04/2019    Imaging:  No results found.  Medications: I have reviewed the patient's current medications.  Assessment/Plan: 1. 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  The tumor returned microsatellite stable with no loss of mismatch repair protein expression   APC mutated. No BRAF, KRAS, or NRAS mutation On Foundation 1  testing   Cycle 1 adjuvant FOLFOX 09/08/2013   Cycle 2 adjuvant FOLFOX 09/24/2013   Cycle 3 adjuvant FOLFOX 10/08/2013.   Cycle 4 adjuvant FOLFOX 10/22/2013.   Cycle 5 adjuvant FOLFOX 11/05/2013. Oxaliplatin held due to thrombocytopenia.  Cycle 6 FOLFOX 11/19/2013.  Cycle 7 FOLFOX 12/03/2013. Oxaliplatin held secondary to thrombocytopenia.  Cycle 8 FOLFOX 12/17/2013.  Cycle 9 FOLFOX 01/04/2014. Oxaliplatin held secondary to neutropenia.   Cycle 10 FOLFOX 01/21/2014. Oxaliplatin held secondary to thrombocytopenia.  Cycle 11 FOLFOX 02/04/2014  Cycle 12 FOLFOX 02/18/2014, oxaliplatin dose reduced secondary tothrombocytopenia  CT abdomen/pelvis 01/30/2014 revealed splenomegaly and no evidence of recurrent colon cancer  CT chest 04/07/2014 with a stable right lower lobe nodule and no evidence for metastatic disease, no nodules seen on the CT 11/26/2014  Markedly elevated CEA 11/24/2014  CT 11/26/2014 revealed a right pelvic mass, splenomegaly, small volume ascites  Right salpingo-oophorectomy 12/28/2014 with the pathology confirming metastatic colon cancer  CTs 03/23/2016-no evidence of recurrent or metastatic disease.  CT 11/26/2016-enlargement of a fluid density structure the right pelvic sidewall, no other evidence of metastatic disease  CT aspiration right pelvic cyst 12/19/2016.Cytology-BENIGN REACTIVE/REPARATIVE CHANGES.  CTs 06/05/2017-no evidence of metastatic disease, mild cirrhotic changes with splenomegaly  CTs 12/11/2017-recurrent cystic right adnexal mass, similar to on the CT 11/26/2016, stable mild splenomegaly  CTs 04/23/2018-enlargement of cystic right adnexal mass with mural nodularity, no other evidence of metastatic disease  Cytoreductive surgery/HIPEC with mitomycin by Dr. Clovis Riley at [facility] 08/12/2018-R1 resection achieved. Cytoreduction included  omentectomy, LAR, right salpingo-oophorectomy and left colonic gutter/pelvic stripping. Pathology on  the rectum showed recurrent/metastatic adenocarcinoma, tumor 2.0 cm, predominantly involving the subserosa and muscularis propria of the colon, proximal and distal margins of resection were negative, vascular invasion present, metastatic carcinoma present in 1 out of 5 lymph nodes; omentum resection with no malignancy seen, no metastatic carcinoma identified in 1 lymph node examined; left gutter stripping positive metastatic adenocarcinoma; right ovary resection positive metastatic adenocarcinoma.  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  CEA 68 02/03/2019  Biopsy liver lesion 02/11/2019-metastatic adenocarcinoma consistent with primary colonic adenocarcinoma  CTs 02/27/2019-multiple liver lesions increased in size, new lesion in the lateral right lobe of the liver. No evidence of metastatic disease in the chest. Redemonstrated moderate left hydronephrosis and proximal hydroureter without discrete lesion or obstructing etiology at the transition point of the mid ureter.  Cycle 1 FOLFIRI/Panitumumab 03/12/2019  Cycle 2 FOLFIRI/Panitumumab 03/26/2019-bolus 5-FU and irinotecan held secondary to neutropenia  Cycle 3 FOLFIRI/Panitumumab 04/09/2019, Udenyca added-not given secondary to seizure/discontinuation of the 5-FU pump  CT abdomen/pelvis at [facility] 05/04/2019-no residual fluid collection at the left abdominal wall abscess, stable moderate left hydronephrosis, multifocal indeterminate liver lesions--liver lesions significantly improved  Cycle 4 FOLFIRI/Panitumumab 05/07/2019, Udenyca  Cycle 5 FOLFIRI/Panitumumab 05/20/2019, Udenyca 2. Mild elevation of the CEA beginning January 2016, normal on 05/19/2014   3. History of iron deficiency anemia  4. seizure disorder; seizure 04/09/2019. Brain CT negative. Now on Keppra. 5. history of depression  6. 4 mm right lower lobe nodule on a staging chest CT 09/08/2013 , stable on  a CT 04/07/2014 7. Hospitalization 09/24/2013 through 09/26/2013 with fever and abdominal pain.  8. 09/24/2013 urine culture positive for coag negative staph.  9. 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 Splenomegaly noted on a CT scan 01/30/2014,persistent on repeat CTs 12. Colonoscopy 11/17/2018-flexible sigmoidoscopy per rectum with changes of mild diversion colitis. Scope advanced for approximately 25 cm. Most proximal portion had necrotic appearing debris. Colostomy bag insufflated suggesting some type of communication between the pouch and the proximal colon. Introduction of scope into the ostomy found to available directions. 1 toward the distal pouch with similar appearing necrotic debris encountered. The other was about a 30 cm segment of normal-appearing colonic mucosa to the level of the previous right hemicolectomy ileocolonic anastomosis. 45. Port-A-Cath placement interventional radiology 03/05/2019 14. Possible abdominal wall abscess status post evaluation by surgery at [facility] 04/03/2019, antibiotics initiated; incision and drainage with purulent material removed 04/22/2019  CT at [facility] 05/04/2019-no fluid collection at the site of the left abdominal wall abscess, "indeterminate" liver lesions  Disposition: [name] appears stable.  She has completed 5 cycles of FOLFIRI/Panitumumab.  Most recent CEA was further improved.  She was able to control the diarrhea with Lomotil.  Unfortunately the abdominal wound has reopened.  We are holding today's chemotherapy and will keep her on the schedule for 2 weeks.  She has follow-up with surgery at [facility] later today.  She will return for lab, follow-up, FOLFIRI/Panitumumab in 2 weeks.  She will contact the office in the interim with any problems.  Patient seen with Dr. Benay Spice.    Ned Card ANP/GNP-BC   06/04/2019  10:13 AM  This  was a shared visit with Ned Card.  [name] has completed 5 cycles of FOLFIRI/Panitumumab.  The abdominal wound has again open.  She will be seen by surgery later today.  We placed chemotherapy on hold until she returns in 2 weeks.  Julieanne Manson, MD

## 2019-06-06 ENCOUNTER — Inpatient Hospital Stay: Payer: BC Managed Care – PPO

## 2019-06-09 DIAGNOSIS — C182 Malignant neoplasm of ascending colon: Secondary | ICD-10-CM | POA: Diagnosis not present

## 2019-06-18 ENCOUNTER — Inpatient Hospital Stay: Payer: BC Managed Care – PPO

## 2019-06-18 ENCOUNTER — Other Ambulatory Visit: Payer: Self-pay

## 2019-06-18 ENCOUNTER — Inpatient Hospital Stay: Payer: BC Managed Care – PPO | Attending: Oncology | Admitting: Oncology

## 2019-06-18 VITALS — BP 128/75 | HR 77 | Temp 98.7°F | Resp 17 | Ht 66.0 in | Wt 146.5 lb

## 2019-06-18 DIAGNOSIS — Z5112 Encounter for antineoplastic immunotherapy: Secondary | ICD-10-CM | POA: Insufficient documentation

## 2019-06-18 DIAGNOSIS — R21 Rash and other nonspecific skin eruption: Secondary | ICD-10-CM | POA: Insufficient documentation

## 2019-06-18 DIAGNOSIS — C182 Malignant neoplasm of ascending colon: Secondary | ICD-10-CM

## 2019-06-18 DIAGNOSIS — D696 Thrombocytopenia, unspecified: Secondary | ICD-10-CM | POA: Insufficient documentation

## 2019-06-18 DIAGNOSIS — Z5111 Encounter for antineoplastic chemotherapy: Secondary | ICD-10-CM | POA: Insufficient documentation

## 2019-06-18 DIAGNOSIS — C787 Secondary malignant neoplasm of liver and intrahepatic bile duct: Secondary | ICD-10-CM

## 2019-06-18 DIAGNOSIS — Z5189 Encounter for other specified aftercare: Secondary | ICD-10-CM | POA: Diagnosis not present

## 2019-06-18 DIAGNOSIS — Z452 Encounter for adjustment and management of vascular access device: Secondary | ICD-10-CM | POA: Diagnosis not present

## 2019-06-18 DIAGNOSIS — C7961 Secondary malignant neoplasm of right ovary: Secondary | ICD-10-CM | POA: Diagnosis not present

## 2019-06-18 LAB — CBC WITH DIFFERENTIAL (CANCER CENTER ONLY)
Abs Immature Granulocytes: 0.01 10*3/uL (ref 0.00–0.07)
Basophils Absolute: 0 10*3/uL (ref 0.0–0.1)
Basophils Relative: 0 %
Eosinophils Absolute: 0.1 10*3/uL (ref 0.0–0.5)
Eosinophils Relative: 2 %
HCT: 34.9 % — ABNORMAL LOW (ref 36.0–46.0)
Hemoglobin: 10.7 g/dL — ABNORMAL LOW (ref 12.0–15.0)
Immature Granulocytes: 0 %
Lymphocytes Relative: 21 %
Lymphs Abs: 0.7 10*3/uL (ref 0.7–4.0)
MCH: 24.7 pg — ABNORMAL LOW (ref 26.0–34.0)
MCHC: 30.7 g/dL (ref 30.0–36.0)
MCV: 80.6 fL (ref 80.0–100.0)
Monocytes Absolute: 0.2 10*3/uL (ref 0.1–1.0)
Monocytes Relative: 7 %
Neutro Abs: 2.2 10*3/uL (ref 1.7–7.7)
Neutrophils Relative %: 70 %
Platelet Count: 159 10*3/uL (ref 150–400)
RBC: 4.33 MIL/uL (ref 3.87–5.11)
RDW: 17.9 % — ABNORMAL HIGH (ref 11.5–15.5)
WBC Count: 3.1 10*3/uL — ABNORMAL LOW (ref 4.0–10.5)
nRBC: 0 % (ref 0.0–0.2)

## 2019-06-18 LAB — CMP (CANCER CENTER ONLY)
ALT: 18 U/L (ref 0–44)
AST: 19 U/L (ref 15–41)
Albumin: 3.7 g/dL (ref 3.5–5.0)
Alkaline Phosphatase: 116 U/L (ref 38–126)
Anion gap: 8 (ref 5–15)
BUN: 11 mg/dL (ref 6–20)
CO2: 26 mmol/L (ref 22–32)
Calcium: 9.3 mg/dL (ref 8.9–10.3)
Chloride: 109 mmol/L (ref 98–111)
Creatinine: 0.69 mg/dL (ref 0.44–1.00)
GFR, Est AFR Am: >60 mL/min (ref >60)
GFR, Estimated: >60 mL/min (ref >60)
Glucose, Bld: 163 mg/dL — ABNORMAL HIGH (ref 70–99)
Potassium: 3.9 mmol/L (ref 3.5–5.1)
Sodium: 143 mmol/L (ref 135–145)
Total Bilirubin: 0.5 mg/dL (ref 0.3–1.2)
Total Protein: 6.8 g/dL (ref 6.5–8.1)

## 2019-06-18 LAB — CEA (IN HOUSE-CHCC): CEA (CHCC-In House): 15.3 ng/mL — ABNORMAL HIGH (ref 0.00–5.00)

## 2019-06-18 LAB — MAGNESIUM: Magnesium: 1.6 mg/dL — ABNORMAL LOW (ref 1.7–2.4)

## 2019-06-18 MED ORDER — MAGNESIUM SULFATE 2 GM/50ML IV SOLN
INTRAVENOUS | Status: AC
  Filled 2019-06-18: qty 50

## 2019-06-18 MED ORDER — SODIUM CHLORIDE 0.9 % IV SOLN
10.0000 mg | Freq: Once | INTRAVENOUS | Status: AC
  Administered 2019-06-18: 10 mg via INTRAVENOUS
  Filled 2019-06-18: qty 10
  Filled 2019-06-18: qty 1

## 2019-06-18 MED ORDER — MAGNESIUM SULFATE 2 GM/50ML IV SOLN
2.0000 g | Freq: Once | INTRAVENOUS | Status: AC
  Administered 2019-06-18: 2 g via INTRAVENOUS

## 2019-06-18 MED ORDER — SODIUM CHLORIDE 0.9% FLUSH
10.0000 mL | INTRAVENOUS | Status: DC | PRN
  Filled 2019-06-18: qty 10

## 2019-06-18 MED ORDER — FLUOROURACIL CHEMO INJECTION 2.5 GM/50ML
400.0000 mg/m2 | Freq: Once | INTRAVENOUS | Status: AC
  Administered 2019-06-18: 700 mg via INTRAVENOUS
  Filled 2019-06-18: qty 14

## 2019-06-18 MED ORDER — ATROPINE SULFATE 1 MG/ML IJ SOLN
0.5000 mg | Freq: Once | INTRAMUSCULAR | Status: AC | PRN
  Administered 2019-06-18: 0.5 mg via INTRAVENOUS

## 2019-06-18 MED ORDER — PALONOSETRON HCL INJECTION 0.25 MG/5ML
INTRAVENOUS | Status: AC
  Filled 2019-06-18: qty 5

## 2019-06-18 MED ORDER — HEPARIN SOD (PORK) LOCK FLUSH 100 UNIT/ML IV SOLN
500.0000 [IU] | Freq: Once | INTRAVENOUS | Status: DC | PRN
  Filled 2019-06-18: qty 5

## 2019-06-18 MED ORDER — SODIUM CHLORIDE 0.9 % IV SOLN
400.0000 mg/m2 | Freq: Once | INTRAVENOUS | Status: AC
  Administered 2019-06-18: 716 mg via INTRAVENOUS
  Filled 2019-06-18: qty 35.8

## 2019-06-18 MED ORDER — SODIUM CHLORIDE 0.9 % IV SOLN
180.0000 mg/m2 | Freq: Once | INTRAVENOUS | Status: AC
  Administered 2019-06-18: 320 mg via INTRAVENOUS
  Filled 2019-06-18: qty 5

## 2019-06-18 MED ORDER — PALONOSETRON HCL INJECTION 0.25 MG/5ML
0.2500 mg | Freq: Once | INTRAVENOUS | Status: AC
  Administered 2019-06-18: 0.25 mg via INTRAVENOUS

## 2019-06-18 MED ORDER — ATROPINE SULFATE 1 MG/ML IJ SOLN
INTRAMUSCULAR | Status: AC
  Filled 2019-06-18: qty 1

## 2019-06-18 MED ORDER — SODIUM CHLORIDE 0.9 % IV SOLN
Freq: Once | INTRAVENOUS | Status: AC
  Filled 2019-06-18: qty 250

## 2019-06-18 MED ORDER — SODIUM CHLORIDE 0.9 % IV SOLN
2400.0000 mg/m2 | INTRAVENOUS | Status: DC
  Administered 2019-06-18: 4300 mg via INTRAVENOUS
  Filled 2019-06-18: qty 86

## 2019-06-18 MED ORDER — SODIUM CHLORIDE 0.9 % IV SOLN
6.0000 mg/kg | Freq: Once | INTRAVENOUS | Status: AC
  Administered 2019-06-18: 400 mg via INTRAVENOUS
  Filled 2019-06-18: qty 20

## 2019-06-18 NOTE — Progress Notes (Signed)
[place] OFFICE PROGRESS NOTE   Diagnosis: Colon cancer  INTERVAL HISTORY:   [name] returns as scheduled.  She reports the left abdominal fistula is closing and has diminished drainage.  No fever.  She continues to have a rash over the face.  She has a pruritic rash at the right thigh/buttock.  Objective:  Vital signs in last 24 hours:  Blood pressure 128/75, pulse 77, temperature 98.7 F (37.1 C), temperature source Temporal, resp. rate 17, height 5' 6"  (1.676 m), weight 146 lb 8 oz (66.5 kg), SpO2 99 %.    HEENT: No thrush or ulcers GI: No hepatomegaly, nontender, left lower quadrant fistula opening measures approximately 2 cm with minimal drainage and minimal surrounding erythema. Vascular: No leg edema  Skin: Pustular rash over the trunk, extremities, and right buttock, few areas of paronychia at the distal fingers  Portacath/PICC-without erythema  Lab Results:  Lab Results  Component Value Date   WBC 3.1 (L) 06/18/2019   HGB 10.7 (L) 06/18/2019   HCT 34.9 (L) 06/18/2019   MCV 80.6 06/18/2019   PLT 159 06/18/2019   NEUTROABS 2.2 06/18/2019    CMP  Lab Results  Component Value Date   NA 143 06/18/2019   K 3.9 06/18/2019   CL 109 06/18/2019   CO2 26 06/18/2019   GLUCOSE 163 (H) 06/18/2019   BUN 11 06/18/2019   CREATININE 0.69 06/18/2019   CALCIUM 9.3 06/18/2019   PROT 6.8 06/18/2019   ALBUMIN 3.7 06/18/2019   AST 19 06/18/2019   ALT 18 06/18/2019   ALKPHOS 116 06/18/2019   BILITOT 0.5 06/18/2019   GFRNONAA >60 06/18/2019   GFRAA >60 06/18/2019    Lab Results  Component Value Date   CEA1 15.30 (H) 06/18/2019    Medications: I have reviewed the patient's current medications.   Assessment/Plan: 1. 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  The tumor returned microsatellite stable with no loss of mismatch repair protein  expression   APC mutated. No BRAF, KRAS, or NRAS mutation On Foundation 1 testing   Cycle 1 adjuvant FOLFOX 09/08/2013   Cycle 2 adjuvant FOLFOX 09/24/2013   Cycle 3 adjuvant FOLFOX 10/08/2013.   Cycle 4 adjuvant FOLFOX 10/22/2013.   Cycle 5 adjuvant FOLFOX 11/05/2013. Oxaliplatin held due to thrombocytopenia.  Cycle 6 FOLFOX 11/19/2013.  Cycle 7 FOLFOX 12/03/2013. Oxaliplatin held secondary to thrombocytopenia.  Cycle 8 FOLFOX 12/17/2013.  Cycle 9 FOLFOX 01/04/2014. Oxaliplatin held secondary to neutropenia.   Cycle 10 FOLFOX 01/21/2014. Oxaliplatin held secondary to thrombocytopenia.  Cycle 11 FOLFOX 02/04/2014  Cycle 12 FOLFOX 02/18/2014, oxaliplatin dose reduced secondary tothrombocytopenia  CT abdomen/pelvis 01/30/2014 revealed splenomegaly and no evidence of recurrent colon cancer  CT chest 04/07/2014 with a stable right lower lobe nodule and no evidence for metastatic disease, no nodules seen on the CT 11/26/2014  Markedly elevated CEA 11/24/2014  CT 11/26/2014 revealed a right pelvic mass, splenomegaly, small volume ascites  Right salpingo-oophorectomy 12/28/2014 with the pathology confirming metastatic colon cancer  CTs 03/23/2016-no evidence of recurrent or metastatic disease.  CT 11/26/2016-enlargement of a fluid density structure the right pelvic sidewall, no other evidence of metastatic disease  CT aspiration right pelvic cyst 12/19/2016.Cytology-BENIGN REACTIVE/REPARATIVE CHANGES.  CTs 06/05/2017-no evidence of metastatic disease, mild cirrhotic changes with splenomegaly  CTs 12/11/2017-recurrent cystic right adnexal mass, similar to on the CT 11/26/2016, stable mild splenomegaly  CTs 04/23/2018-enlargement of cystic right adnexal mass with mural  nodularity, no other evidence of metastatic disease  Cytoreductive surgery/HIPEC with mitomycin by Dr. Clovis Riley at [facility] 08/12/2018-R1 resection achieved. Cytoreduction included omentectomy, LAR, right  salpingo-oophorectomy and left colonic gutter/pelvic stripping. Pathology on the rectum showed recurrent/metastatic adenocarcinoma, tumor 2.0 cm, predominantly involving the subserosa and muscularis propria of the colon, proximal and distal margins of resection were negative, vascular invasion present, metastatic carcinoma present in 1 out of 5 lymph nodes; omentum resection with no malignancy seen, no metastatic carcinoma identified in 1 lymph node examined; left gutter stripping positive metastatic adenocarcinoma; right ovary resection positive metastatic adenocarcinoma.  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  CEA 68 02/03/2019  Biopsy liver lesion 02/11/2019-metastatic adenocarcinoma consistent with primary colonic adenocarcinoma  CTs 02/27/2019-multiple liver lesions increased in size, new lesion in the lateral right lobe of the liver. No evidence of metastatic disease in the chest. Redemonstrated moderate left hydronephrosis and proximal hydroureter without discrete lesion or obstructing etiology at the transition point of the mid ureter.  Cycle 1 FOLFIRI/Panitumumab 03/12/2019  Cycle 2 FOLFIRI/Panitumumab 03/26/2019-bolus 5-FU and irinotecan held secondary to neutropenia  Cycle 3 FOLFIRI/Panitumumab 04/09/2019, Udenyca added-not given secondary to seizure/discontinuation of the 5-FU pump  CT abdomen[facility]pelvis at [facility] 05/04/2019-no residual fluid collection at the left abdominal wall abscess, stable moderate left hydronephrosis, multifocal indeterminate liver lesions--liver lesions significantly improved  Cycle 4 FOLFIRI/Panitumumab 05/07/2019, Udenyca  Cycle 5 FOLFIRI/Panitumumab 05/20/2019, Udenyca  Cycle 6 FOLFIRI/Panitumumab 06/18/2019, Udenyca 2. Mild elevation of the CEA beginning January 2016, normal on 05/19/2014   3. History of iron deficiency anemia  4. seizure disorder; seizure 04/09/2019. Brain CT  negative. Now on Keppra. 5. history of depression  6. 4 mm right lower lobe nodule on a staging chest CT 09/08/2013 , stable on a CT 04/07/2014 7. Hospitalization 09/24/2013 through 09/26/2013 with fever and abdominal pain.  8. 09/24/2013 urine culture positive for coag negative staph.  9. 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 Splenomegaly noted on a CT scan 01/30/2014,persistent on repeat CTs 12. Colonoscopy 11/17/2018-flexible sigmoidoscopy per rectum with changes of mild diversion colitis. Scope advanced for approximately 25 cm. Most proximal portion had necrotic appearing debris. Colostomy bag insufflated suggesting some type of communication between the pouch and the proximal colon. Introduction of scope into the ostomy found to available directions. 1 toward the distal pouch with similar appearing necrotic debris encountered. The other was about a 30 cm segment of normal-appearing colonic mucosa to the level of the previous right hemicolectomy ileocolonic anastomosis. 21. Port-A-Cath placement interventional radiology 03/05/2019 14. 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 Michna appears well.  She continues follow-up with Dr. Clovis Riley at [facility] for management of the abdominal fistula.  She is scheduled for barium enema and office visit with Dr. Clovis Riley tomorrow.  The CEA is lower.  There has been radiologic evidence of a response to the FOLFIRI/Panitumumab.  She will complete another treatment today.  She  will return for an office visit and chemotherapy in 2 weeks.  The rash over the trunk and right thigh/buttock is consistent with a Panitumumab rash.  Emily Coder, MD  06/18/2019  12:19 PM

## 2019-06-18 NOTE — Progress Notes (Signed)
Give magnesium 2gm IV today x1 for level of 1.6 per Dr. Benay Spice.  Demetrius Charity, PharmD, BCPS, [facility] Pharmacist Pharmacy Phone: [phone] 06/18/2019

## 2019-06-18 NOTE — Patient Instructions (Signed)
[facility] Discharge Instructions for Patients Receiving Chemotherapy  Today you received the following chemotherapy agents: Vectibix/Irinotecan/Leucovorin/5FU.  To help prevent nausea and vomiting after your treatment, we encourage you to take your nausea medication as directed.   If you develop nausea and vomiting that is not controlled by your nausea medication, call the clinic.   BELOW ARE SYMPTOMS THAT SHOULD BE REPORTED IMMEDIATELY:  *FEVER GREATER THAN 100.5 F  *CHILLS WITH OR WITHOUT FEVER  NAUSEA AND VOMITING THAT IS NOT CONTROLLED WITH YOUR NAUSEA MEDICATION  *UNUSUAL SHORTNESS OF BREATH  *UNUSUAL BRUISING OR BLEEDING  TENDERNESS IN MOUTH AND THROAT WITH OR WITHOUT PRESENCE OF ULCERS  *URINARY PROBLEMS  *BOWEL PROBLEMS  UNUSUAL RASH Items with * indicate a potential emergency and should be followed up as soon as possible.  Feel free to call the clinic should you have any questions or concerns. The clinic phone number is [phone].  Please show the CHEMO ALERT CARD at check-in to the Emergency Department and triage nurse.   

## 2019-06-19 DIAGNOSIS — K632 Fistula of intestine: Secondary | ICD-10-CM | POA: Diagnosis not present

## 2019-06-19 DIAGNOSIS — Z9889 Other specified postprocedural states: Secondary | ICD-10-CM | POA: Diagnosis not present

## 2019-06-19 DIAGNOSIS — C182 Malignant neoplasm of ascending colon: Secondary | ICD-10-CM | POA: Diagnosis not present

## 2019-06-20 ENCOUNTER — Inpatient Hospital Stay: Payer: BC Managed Care – PPO

## 2019-06-20 ENCOUNTER — Other Ambulatory Visit: Payer: Self-pay

## 2019-06-20 VITALS — BP 109/72 | HR 72 | Temp 98.9°F | Resp 20

## 2019-06-20 DIAGNOSIS — Z5189 Encounter for other specified aftercare: Secondary | ICD-10-CM | POA: Diagnosis not present

## 2019-06-20 DIAGNOSIS — Z5111 Encounter for antineoplastic chemotherapy: Secondary | ICD-10-CM | POA: Diagnosis not present

## 2019-06-20 DIAGNOSIS — Z5112 Encounter for antineoplastic immunotherapy: Secondary | ICD-10-CM | POA: Diagnosis not present

## 2019-06-20 DIAGNOSIS — C787 Secondary malignant neoplasm of liver and intrahepatic bile duct: Secondary | ICD-10-CM | POA: Diagnosis not present

## 2019-06-20 DIAGNOSIS — Z95828 Presence of other vascular implants and grafts: Secondary | ICD-10-CM

## 2019-06-20 DIAGNOSIS — D696 Thrombocytopenia, unspecified: Secondary | ICD-10-CM | POA: Diagnosis not present

## 2019-06-20 DIAGNOSIS — C182 Malignant neoplasm of ascending colon: Secondary | ICD-10-CM | POA: Diagnosis not present

## 2019-06-20 DIAGNOSIS — Z452 Encounter for adjustment and management of vascular access device: Secondary | ICD-10-CM | POA: Diagnosis not present

## 2019-06-20 DIAGNOSIS — R21 Rash and other nonspecific skin eruption: Secondary | ICD-10-CM | POA: Diagnosis not present

## 2019-06-20 DIAGNOSIS — C7961 Secondary malignant neoplasm of right ovary: Secondary | ICD-10-CM | POA: Diagnosis not present

## 2019-06-20 MED ORDER — SODIUM CHLORIDE 0.9% FLUSH
10.0000 mL | Freq: Once | INTRAVENOUS | Status: AC
  Administered 2019-06-20: 10 mL
  Filled 2019-06-20: qty 10

## 2019-06-20 MED ORDER — HEPARIN SOD (PORK) LOCK FLUSH 100 UNIT/ML IV SOLN
500.0000 [IU] | Freq: Once | INTRAVENOUS | Status: AC
  Administered 2019-06-20: 500 [IU]
  Filled 2019-06-20: qty 5

## 2019-06-20 MED ORDER — PEGFILGRASTIM-CBQV 6 MG/0.6ML ~~LOC~~ SOSY
6.0000 mg | PREFILLED_SYRINGE | Freq: Once | SUBCUTANEOUS | Status: AC
  Administered 2019-06-20: 6 mg via SUBCUTANEOUS

## 2019-06-20 NOTE — Patient Instructions (Signed)

## 2019-06-24 ENCOUNTER — Telehealth: Payer: Self-pay | Admitting: Oncology

## 2019-06-24 NOTE — Telephone Encounter (Signed)
Scheduled per los. Called and left msg. Mailed printout  °

## 2019-06-25 NOTE — Progress Notes (Signed)
Pharmacist Chemotherapy Monitoring - Follow Up Assessment    I verify that I have reviewed each item in the below checklist:  . Regimen for the patient is scheduled for the appropriate day and plan matches scheduled date. Marland Kitchen Appropriate non-routine labs are ordered dependent on drug ordered. . If applicable, additional medications reviewed and ordered per protocol based on lifetime cumulative doses and/or treatment regimen.   Plan for follow-up and/or issues identified: No . I-vent associated with next due treatment: no . MD and/or nursing notified: No  [name] [facility] 06/25/2019 4:10 PM

## 2019-06-27 ENCOUNTER — Other Ambulatory Visit: Payer: Self-pay | Admitting: Oncology

## 2019-06-28 ENCOUNTER — Encounter: Payer: Self-pay | Admitting: Nurse Practitioner

## 2019-06-29 ENCOUNTER — Telehealth: Payer: Self-pay

## 2019-06-29 ENCOUNTER — Other Ambulatory Visit: Payer: Self-pay

## 2019-06-29 DIAGNOSIS — C787 Secondary malignant neoplasm of liver and intrahepatic bile duct: Secondary | ICD-10-CM

## 2019-06-29 DIAGNOSIS — C182 Malignant neoplasm of ascending colon: Secondary | ICD-10-CM

## 2019-06-29 MED ORDER — DOXYCYCLINE HYCLATE 100 MG PO TABS: 100.0000 mg | ORAL_TABLET | Freq: Two times a day (BID) | ORAL | 5 refills | Status: DC

## 2019-06-29 NOTE — Telephone Encounter (Signed)
TC to pt in regard to her non-urgent medical question. I let her know that I refilled her Doxycycline medication as requested and it was sent to [place]. Patient verbalized understanding.

## 2019-07-01 ENCOUNTER — Inpatient Hospital Stay (HOSPITAL_BASED_OUTPATIENT_CLINIC_OR_DEPARTMENT_OTHER): Payer: BC Managed Care – PPO | Admitting: Nurse Practitioner

## 2019-07-01 ENCOUNTER — Inpatient Hospital Stay: Payer: BC Managed Care – PPO

## 2019-07-01 ENCOUNTER — Other Ambulatory Visit: Payer: Self-pay

## 2019-07-01 ENCOUNTER — Encounter: Payer: Self-pay | Admitting: Nurse Practitioner

## 2019-07-01 VITALS — BP 116/68 | HR 74 | Temp 98.1°F | Resp 18 | Wt 146.8 lb

## 2019-07-01 DIAGNOSIS — C182 Malignant neoplasm of ascending colon: Secondary | ICD-10-CM | POA: Diagnosis not present

## 2019-07-01 DIAGNOSIS — Z452 Encounter for adjustment and management of vascular access device: Secondary | ICD-10-CM | POA: Diagnosis not present

## 2019-07-01 DIAGNOSIS — C7961 Secondary malignant neoplasm of right ovary: Secondary | ICD-10-CM | POA: Diagnosis not present

## 2019-07-01 DIAGNOSIS — D696 Thrombocytopenia, unspecified: Secondary | ICD-10-CM | POA: Diagnosis not present

## 2019-07-01 DIAGNOSIS — Z5111 Encounter for antineoplastic chemotherapy: Secondary | ICD-10-CM | POA: Diagnosis not present

## 2019-07-01 DIAGNOSIS — Z5189 Encounter for other specified aftercare: Secondary | ICD-10-CM | POA: Diagnosis not present

## 2019-07-01 DIAGNOSIS — Z5112 Encounter for antineoplastic immunotherapy: Secondary | ICD-10-CM | POA: Diagnosis not present

## 2019-07-01 DIAGNOSIS — R21 Rash and other nonspecific skin eruption: Secondary | ICD-10-CM | POA: Diagnosis not present

## 2019-07-01 DIAGNOSIS — C787 Secondary malignant neoplasm of liver and intrahepatic bile duct: Secondary | ICD-10-CM | POA: Diagnosis not present

## 2019-07-01 DIAGNOSIS — Z95828 Presence of other vascular implants and grafts: Secondary | ICD-10-CM

## 2019-07-01 LAB — CMP (CANCER CENTER ONLY)
ALT: 11 U/L (ref 0–44)
AST: 13 U/L — ABNORMAL LOW (ref 15–41)
Albumin: 3.6 g/dL (ref 3.5–5.0)
Alkaline Phosphatase: 134 U/L — ABNORMAL HIGH (ref 38–126)
Anion gap: 7 (ref 5–15)
BUN: 6 mg/dL (ref 6–20)
CO2: 30 mmol/L (ref 22–32)
Calcium: 9 mg/dL (ref 8.9–10.3)
Chloride: 108 mmol/L (ref 98–111)
Creatinine: 0.78 mg/dL (ref 0.44–1.00)
GFR, Est AFR Am: >60 mL/min (ref >60)
GFR, Estimated: >60 mL/min (ref >60)
Glucose, Bld: 171 mg/dL — ABNORMAL HIGH (ref 70–99)
Potassium: 3.7 mmol/L (ref 3.5–5.1)
Sodium: 145 mmol/L (ref 135–145)
Total Bilirubin: 0.5 mg/dL (ref 0.3–1.2)
Total Protein: 6.6 g/dL (ref 6.5–8.1)

## 2019-07-01 LAB — CBC WITH DIFFERENTIAL (CANCER CENTER ONLY)
Abs Immature Granulocytes: 0.14 10*3/uL — ABNORMAL HIGH (ref 0.00–0.07)
Basophils Absolute: 0 10*3/uL (ref 0.0–0.1)
Basophils Relative: 1 %
Eosinophils Absolute: 0.2 10*3/uL (ref 0.0–0.5)
Eosinophils Relative: 3 %
HCT: 33.7 % — ABNORMAL LOW (ref 36.0–46.0)
Hemoglobin: 10.4 g/dL — ABNORMAL LOW (ref 12.0–15.0)
Immature Granulocytes: 3 %
Lymphocytes Relative: 17 %
Lymphs Abs: 0.9 10*3/uL (ref 0.7–4.0)
MCH: 24.7 pg — ABNORMAL LOW (ref 26.0–34.0)
MCHC: 30.9 g/dL (ref 30.0–36.0)
MCV: 80 fL (ref 80.0–100.0)
Monocytes Absolute: 0.2 10*3/uL (ref 0.1–1.0)
Monocytes Relative: 4 %
Neutro Abs: 3.9 10*3/uL (ref 1.7–7.7)
Neutrophils Relative %: 72 %
Platelet Count: 91 10*3/uL — ABNORMAL LOW (ref 150–400)
RBC: 4.21 MIL/uL (ref 3.87–5.11)
RDW: 16.7 % — ABNORMAL HIGH (ref 11.5–15.5)
WBC Count: 5.4 10*3/uL (ref 4.0–10.5)
nRBC: 0 % (ref 0.0–0.2)

## 2019-07-01 LAB — MAGNESIUM: Magnesium: 1.6 mg/dL — ABNORMAL LOW (ref 1.7–2.4)

## 2019-07-01 LAB — CEA (IN HOUSE-CHCC): CEA (CHCC-In House): 12.91 ng/mL — ABNORMAL HIGH (ref 0.00–5.00)

## 2019-07-01 MED ORDER — SODIUM CHLORIDE 0.9% FLUSH
10.0000 mL | INTRAVENOUS | Status: DC | PRN
  Filled 2019-07-01: qty 10

## 2019-07-01 MED ORDER — SODIUM CHLORIDE 0.9 % IV SOLN
140.0000 mg/m2 | Freq: Once | INTRAVENOUS | Status: AC
  Administered 2019-07-01: 260 mg via INTRAVENOUS
  Filled 2019-07-01: qty 13

## 2019-07-01 MED ORDER — SODIUM CHLORIDE 0.9 % IV SOLN
400.0000 mg/m2 | Freq: Once | INTRAVENOUS | Status: AC
  Administered 2019-07-01: 716 mg via INTRAVENOUS
  Filled 2019-07-01: qty 35.8

## 2019-07-01 MED ORDER — SODIUM CHLORIDE 0.9% FLUSH
10.0000 mL | Freq: Once | INTRAVENOUS | Status: AC
  Administered 2019-07-01: 10 mL
  Filled 2019-07-01: qty 10

## 2019-07-01 MED ORDER — ATROPINE SULFATE 1 MG/ML IJ SOLN
INTRAMUSCULAR | Status: AC
  Filled 2019-07-01: qty 1

## 2019-07-01 MED ORDER — FLUOROURACIL CHEMO INJECTION 2.5 GM/50ML
400.0000 mg/m2 | Freq: Once | INTRAVENOUS | Status: AC
  Administered 2019-07-01: 700 mg via INTRAVENOUS
  Filled 2019-07-01: qty 14

## 2019-07-01 MED ORDER — SODIUM CHLORIDE 0.9 % IV SOLN
2400.0000 mg/m2 | INTRAVENOUS | Status: DC
  Administered 2019-07-01: 4300 mg via INTRAVENOUS
  Filled 2019-07-01: qty 86

## 2019-07-01 MED ORDER — SODIUM CHLORIDE 0.9 % IV SOLN
6.0000 mg/kg | Freq: Once | INTRAVENOUS | Status: AC
  Administered 2019-07-01: 400 mg via INTRAVENOUS
  Filled 2019-07-01: qty 20

## 2019-07-01 MED ORDER — ATROPINE SULFATE 1 MG/ML IJ SOLN
0.5000 mg | Freq: Once | INTRAMUSCULAR | Status: AC | PRN
  Administered 2019-07-01: 0.5 mg via INTRAVENOUS

## 2019-07-01 MED ORDER — SODIUM CHLORIDE 0.9 % IV SOLN
Freq: Once | INTRAVENOUS | Status: AC
Start: 2019-07-01 — End: 2019-07-01
  Filled 2019-07-01: qty 250

## 2019-07-01 MED ORDER — PALONOSETRON HCL INJECTION 0.25 MG/5ML
0.2500 mg | Freq: Once | INTRAVENOUS | Status: AC
Start: 2019-07-01 — End: 2019-07-01
  Administered 2019-07-01: 0.25 mg via INTRAVENOUS

## 2019-07-01 MED ORDER — PALONOSETRON HCL INJECTION 0.25 MG/5ML
INTRAVENOUS | Status: AC
  Filled 2019-07-01: qty 5

## 2019-07-01 MED ORDER — SODIUM CHLORIDE 0.9 % IV SOLN
10.0000 mg | Freq: Once | INTRAVENOUS | Status: AC
  Administered 2019-07-01: 10 mg via INTRAVENOUS
  Filled 2019-07-01: qty 10

## 2019-07-01 MED ORDER — HEPARIN SOD (PORK) LOCK FLUSH 100 UNIT/ML IV SOLN
500.0000 [IU] | Freq: Once | INTRAVENOUS | Status: DC | PRN
  Filled 2019-07-01: qty 5

## 2019-07-01 NOTE — Progress Notes (Addendum)
[place] OFFICE PROGRESS NOTE   Diagnosis: Colon cancer  INTERVAL HISTORY:   [name] returns as scheduled.  She completed a cycle of FOLFIRI/Panitumumab 06/18/2019.  She denies nausea/vomiting.  She had a few mouth sores for several days.  This did not impact her ability to eat or drink.  She has intermittent diarrhea, takes Lomotil if needed.  She continues to have drainage from the fistula.  She denies bleeding.  Rash is stable.  Objective:  Vital signs in last 24 hours:  Blood pressure 116/68, pulse 74, temperature 98.1 F (36.7 C), temperature source Oral, resp. rate 18, weight 146 lb 12.8 oz (66.6 kg), SpO2 96 %.    HEENT: No thrush or ulcers. GI: Abdomen soft and nontender.  No hepatomegaly.  Left lower quadrant fistula opening with drainage, significant surrounding erythema. Vascular: No leg edema.   Port-A-Cath without erythema.  Lab Results:  Lab Results  Component Value Date   WBC 5.4 07/01/2019   HGB 10.4 (L) 07/01/2019   HCT 33.7 (L) 07/01/2019   MCV 80.0 07/01/2019   PLT 91 (L) 07/01/2019   NEUTROABS 3.9 07/01/2019    Imaging:  No results found.  Medications: I have reviewed the patient's current medications.  Assessment/Plan: 1. 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  The tumor returned microsatellite stable with no loss of mismatch repair protein expression   APC mutated. No BRAF, KRAS, or NRAS mutation On [facility] 1 testing   Cycle 1 adjuvant FOLFOX 09/08/2013   Cycle 2 adjuvant FOLFOX 09/24/2013   Cycle 3 adjuvant FOLFOX 10/08/2013.   Cycle 4 adjuvant FOLFOX 10/22/2013.   Cycle 5 adjuvant FOLFOX 11/05/2013. Oxaliplatin held due to thrombocytopenia.  Cycle 6 FOLFOX 11/19/2013.  Cycle 7 FOLFOX 12/03/2013. Oxaliplatin held secondary to thrombocytopenia.  Cycle 8 FOLFOX 12/17/2013.  Cycle 9 FOLFOX 01/04/2014.  Oxaliplatin held secondary to neutropenia.   Cycle 10 FOLFOX 01/21/2014. Oxaliplatin held secondary to thrombocytopenia.  Cycle 11 FOLFOX 02/04/2014  Cycle 12 FOLFOX 02/18/2014, oxaliplatin dose reduced secondary tothrombocytopenia  CT abdomen/pelvis 01/30/2014 revealed splenomegaly and no evidence of recurrent colon cancer  CT chest 04/07/2014 with a stable right lower lobe nodule and no evidence for metastatic disease, no nodules seen on the CT 11/26/2014  Markedly elevated CEA 11/24/2014  CT 11/26/2014 revealed a right pelvic mass, splenomegaly, small volume ascites  Right salpingo-oophorectomy 12/28/2014 with the pathology confirming metastatic colon cancer  CTs 03/23/2016-no evidence of recurrent or metastatic disease.  CT 11/26/2016-enlargement of a fluid density structure the right pelvic sidewall, no other evidence of metastatic disease  CT aspiration right pelvic cyst 12/19/2016.Cytology-BENIGN REACTIVE/REPARATIVE CHANGES.  CTs 06/05/2017-no evidence of metastatic disease, mild cirrhotic changes with splenomegaly  CTs 12/11/2017-recurrent cystic right adnexal mass, similar to on the CT 11/26/2016, stable mild splenomegaly  CTs 04/23/2018-enlargement of cystic right adnexal mass with mural nodularity, no other evidence of metastatic disease  Cytoreductive surgery/HIPEC with mitomycin by Dr. Clovis Riley at [facility] 08/12/2018-R1 resection achieved. Cytoreduction included omentectomy, LAR, right salpingo-oophorectomy and left colonic gutter/pelvic stripping. Pathology on the rectum showed recurrent/metastatic adenocarcinoma, tumor 2.0 cm, predominantly involving the subserosa and muscularis propria of the colon, proximal and distal margins of resection were negative, vascular invasion present, metastatic carcinoma present in 1 out of 5 lymph nodes; omentum resection with no malignancy seen, no metastatic carcinoma identified in 1 lymph node examined; left gutter stripping positive  metastatic adenocarcinoma; right ovary resection positive metastatic adenocarcinoma.  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  CEA 68 02/03/2019  Biopsy liver lesion 02/11/2019-metastatic adenocarcinoma consistent with primary colonic adenocarcinoma  CTs 02/27/2019-multiple liver lesions increased in size, new lesion in the lateral right lobe of the liver. No evidence of metastatic disease in the chest. Redemonstrated moderate left hydronephrosis and proximal hydroureter without discrete lesion or obstructing etiology at the transition point of the mid ureter.  Cycle 1 FOLFIRI/Panitumumab 03/12/2019  Cycle 2 FOLFIRI/Panitumumab 03/26/2019-bolus 5-FU and irinotecan held secondary to neutropenia  Cycle 3 FOLFIRI/Panitumumab 04/09/2019, Udenyca added-not given secondary to seizure/discontinuation of the 5-FU pump  CT abdomen[facility]pelvis [facility] 05/04/2019-no residual fluid collection at the left abdominal wall abscess, stable moderate left hydronephrosis, multifocal indeterminate liver lesions--liver lesionssignificantly improved  Cycle 4 FOLFIRI/Panitumumab 05/07/2019, Udenyca  Cycle 5 FOLFIRI/Panitumumab 05/20/2019, Udenyca  Cycle 6 FOLFIRI/Panitumumab 06/18/2019, Udenyca  Cycle 7 FOLFIRI/Panitumumab 07/01/2019, Udenyca 2. Mild elevation of the CEA beginning January 2016, normal on 05/19/2014   3. History of iron deficiency anemia  4. seizure disorder; seizure 04/09/2019. Brain CT negative. Now on Keppra. 5. history of depression  6. 4 mm right lower lobe nodule on a staging chest CT 09/08/2013 , stable on a CT 04/07/2014 7. Hospitalization 09/24/2013 through 09/26/2013 with fever and abdominal pain.  8. 09/24/2013 urine culture positive for coag negative staph.  9. 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 Splenomegaly noted  on a CT scan 01/30/2014,persistent on repeat CTs 12. Colonoscopy 11/17/2018-flexible sigmoidoscopy per rectum with changes of mild diversion colitis. Scope advanced for approximately 25 cm. Most proximal portion had necrotic appearing debris. Colostomy bag insufflated suggesting some type of communication between the pouch and the proximal colon. Introduction of scope into the ostomy found to available directions. 1 toward the distal pouch with similar appearing necrotic debris encountered. The other was about a 30 cm segment of normal-appearing colonic mucosa to the level of the previous right hemicolectomy ileocolonic anastomosis. 33. Port-A-Cath placement interventional radiology 03/05/2019 14. 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  She has completed 6 cycles of FOLFIRI/Panitumumab.  CEA continues to improve.  Plan to proceed with cycle 7 today as scheduled.  We reviewed the CBC and chemistry panel from today.  Labs adequate to proceed.  Irinotecan will be dose reduced due to thrombocytopenia.  She understands to contact the office with bleeding.  The erythema surrounding the fistula site is likely a combination of skin irritation from drainage, possible fungal component.  She is applying nystatin.  She will also apply a barrier cream.  She will return for lab, follow-up, chemotherapy in 2 weeks.  She will contact the office in the interim with any problems.  Patient seen with Dr. Benay Spice.    Ned Card ANP/GNP-BC   07/01/2019  12:40 PM This was a shared visit with Ned Card.  Ms. Scruggs was interviewed and examined.  The plan is to continue FOLFIRI/Panitumumab.  She will continue follow-up with Dr. Clovis Riley for management of the fistula.  We dose reduce the irinotecan secondary to thrombocytopenia.  Julieanne Manson, MD

## 2019-07-01 NOTE — Progress Notes (Signed)
Per Dr. Benay Spice: OK to treat w/platelet count of 91,000. Will dose reduce.

## 2019-07-01 NOTE — Patient Instructions (Signed)
[facility] Discharge Instructions for Patients Receiving Chemotherapy  Today you received the following chemotherapy agents: Vectibix/Irinotecan/Leucovorin/5FU.  To help prevent nausea and vomiting after your treatment, we encourage you to take your nausea medication as directed.   If you develop nausea and vomiting that is not controlled by your nausea medication, call the clinic.   BELOW ARE SYMPTOMS THAT SHOULD BE REPORTED IMMEDIATELY:  *FEVER GREATER THAN 100.5 F  *CHILLS WITH OR WITHOUT FEVER  NAUSEA AND VOMITING THAT IS NOT CONTROLLED WITH YOUR NAUSEA MEDICATION  *UNUSUAL SHORTNESS OF BREATH  *UNUSUAL BRUISING OR BLEEDING  TENDERNESS IN MOUTH AND THROAT WITH OR WITHOUT PRESENCE OF ULCERS  *URINARY PROBLEMS  *BOWEL PROBLEMS  UNUSUAL RASH Items with * indicate a potential emergency and should be followed up as soon as possible.  Feel free to call the clinic should you have any questions or concerns. The clinic phone number is [phone].  Please show the CHEMO ALERT CARD at check-in to the Emergency Department and triage nurse.   

## 2019-07-03 ENCOUNTER — Telehealth: Payer: Self-pay | Admitting: Nurse Practitioner

## 2019-07-03 ENCOUNTER — Inpatient Hospital Stay: Payer: BC Managed Care – PPO

## 2019-07-03 ENCOUNTER — Other Ambulatory Visit: Payer: Self-pay

## 2019-07-03 DIAGNOSIS — Z5112 Encounter for antineoplastic immunotherapy: Secondary | ICD-10-CM | POA: Diagnosis not present

## 2019-07-03 DIAGNOSIS — C787 Secondary malignant neoplasm of liver and intrahepatic bile duct: Secondary | ICD-10-CM | POA: Diagnosis not present

## 2019-07-03 DIAGNOSIS — Z5189 Encounter for other specified aftercare: Secondary | ICD-10-CM | POA: Diagnosis not present

## 2019-07-03 DIAGNOSIS — D696 Thrombocytopenia, unspecified: Secondary | ICD-10-CM | POA: Diagnosis not present

## 2019-07-03 DIAGNOSIS — C182 Malignant neoplasm of ascending colon: Secondary | ICD-10-CM | POA: Diagnosis not present

## 2019-07-03 DIAGNOSIS — Z95828 Presence of other vascular implants and grafts: Secondary | ICD-10-CM

## 2019-07-03 DIAGNOSIS — R21 Rash and other nonspecific skin eruption: Secondary | ICD-10-CM | POA: Diagnosis not present

## 2019-07-03 DIAGNOSIS — Z452 Encounter for adjustment and management of vascular access device: Secondary | ICD-10-CM | POA: Diagnosis not present

## 2019-07-03 DIAGNOSIS — Z5111 Encounter for antineoplastic chemotherapy: Secondary | ICD-10-CM | POA: Diagnosis not present

## 2019-07-03 DIAGNOSIS — C7961 Secondary malignant neoplasm of right ovary: Secondary | ICD-10-CM | POA: Diagnosis not present

## 2019-07-03 MED ORDER — SODIUM CHLORIDE 0.9% FLUSH
10.0000 mL | Freq: Once | INTRAVENOUS | Status: AC
  Administered 2019-07-03: 10 mL
  Filled 2019-07-03: qty 10

## 2019-07-03 MED ORDER — HEPARIN SOD (PORK) LOCK FLUSH 100 UNIT/ML IV SOLN
500.0000 [IU] | Freq: Once | INTRAVENOUS | Status: AC
  Administered 2019-07-03: 500 [IU]
  Filled 2019-07-03: qty 5

## 2019-07-03 NOTE — Telephone Encounter (Signed)
Scheduled per los. Called and left msg. Mailed printout  °

## 2019-07-09 NOTE — Progress Notes (Signed)
Pharmacist Chemotherapy Monitoring - Follow Up Assessment    I verify that I have reviewed each item in the below checklist:   Regimen for the patient is scheduled for the appropriate day and plan matches scheduled date.  Appropriate non-routine labs are ordered dependent on drug ordered.  If applicable, additional medications reviewed and ordered per protocol based on lifetime cumulative doses and/or treatment regimen.   Plan for follow-up and/or issues identified: No  I-vent associated with next due treatment: No  MD and/or nursing notified: No  Ellene Bloodsaw D 07/09/2019 12:40 PM

## 2019-07-09 NOTE — Progress Notes (Signed)
Pharmacist Chemotherapy Monitoring - Follow Up Assessment    I verify that I have reviewed each item in the below checklist:  . Regimen for the patient is scheduled for the appropriate day and plan matches scheduled date. Marland Kitchen Appropriate non-routine labs are ordered dependent on drug ordered. . If applicable, additional medications reviewed and ordered per protocol based on lifetime cumulative doses and/or treatment regimen.   Plan for follow-up and/or issues identified: No . I-vent associated with next due treatment: No . MD and/or nursing notified: No  [name] D 07/09/2019 10:24 AM

## 2019-07-10 DIAGNOSIS — C182 Malignant neoplasm of ascending colon: Secondary | ICD-10-CM | POA: Diagnosis not present

## 2019-07-12 ENCOUNTER — Other Ambulatory Visit: Payer: Self-pay | Admitting: Oncology

## 2019-07-15 ENCOUNTER — Inpatient Hospital Stay: Payer: BC Managed Care – PPO | Attending: Oncology | Admitting: Nurse Practitioner

## 2019-07-15 ENCOUNTER — Encounter: Payer: Self-pay | Admitting: Nurse Practitioner

## 2019-07-15 ENCOUNTER — Inpatient Hospital Stay: Payer: BC Managed Care – PPO

## 2019-07-15 ENCOUNTER — Other Ambulatory Visit: Payer: Self-pay

## 2019-07-15 VITALS — BP 117/70 | HR 65 | Temp 97.6°F | Resp 18 | Ht 66.0 in | Wt 148.2 lb

## 2019-07-15 DIAGNOSIS — K632 Fistula of intestine: Secondary | ICD-10-CM | POA: Diagnosis not present

## 2019-07-15 DIAGNOSIS — Z5111 Encounter for antineoplastic chemotherapy: Secondary | ICD-10-CM | POA: Insufficient documentation

## 2019-07-15 DIAGNOSIS — Z452 Encounter for adjustment and management of vascular access device: Secondary | ICD-10-CM | POA: Diagnosis not present

## 2019-07-15 DIAGNOSIS — C182 Malignant neoplasm of ascending colon: Secondary | ICD-10-CM

## 2019-07-15 DIAGNOSIS — Z5189 Encounter for other specified aftercare: Secondary | ICD-10-CM | POA: Insufficient documentation

## 2019-07-15 DIAGNOSIS — Z95828 Presence of other vascular implants and grafts: Secondary | ICD-10-CM

## 2019-07-15 DIAGNOSIS — Z5112 Encounter for antineoplastic immunotherapy: Secondary | ICD-10-CM | POA: Insufficient documentation

## 2019-07-15 DIAGNOSIS — D709 Neutropenia, unspecified: Secondary | ICD-10-CM | POA: Insufficient documentation

## 2019-07-15 DIAGNOSIS — R197 Diarrhea, unspecified: Secondary | ICD-10-CM | POA: Insufficient documentation

## 2019-07-15 DIAGNOSIS — C787 Secondary malignant neoplasm of liver and intrahepatic bile duct: Secondary | ICD-10-CM | POA: Diagnosis not present

## 2019-07-15 DIAGNOSIS — D696 Thrombocytopenia, unspecified: Secondary | ICD-10-CM | POA: Insufficient documentation

## 2019-07-15 DIAGNOSIS — C7961 Secondary malignant neoplasm of right ovary: Secondary | ICD-10-CM | POA: Diagnosis not present

## 2019-07-15 LAB — CBC WITH DIFFERENTIAL (CANCER CENTER ONLY)
Abs Immature Granulocytes: 0 10*3/uL (ref 0.00–0.07)
Basophils Absolute: 0 10*3/uL (ref 0.0–0.1)
Basophils Relative: 0 %
Eosinophils Absolute: 0.1 10*3/uL (ref 0.0–0.5)
Eosinophils Relative: 3 %
HCT: 33.3 % — ABNORMAL LOW (ref 36.0–46.0)
Hemoglobin: 10.3 g/dL — ABNORMAL LOW (ref 12.0–15.0)
Immature Granulocytes: 0 %
Lymphocytes Relative: 36 %
Lymphs Abs: 0.8 10*3/uL (ref 0.7–4.0)
MCH: 24.3 pg — ABNORMAL LOW (ref 26.0–34.0)
MCHC: 30.9 g/dL (ref 30.0–36.0)
MCV: 78.5 fL — ABNORMAL LOW (ref 80.0–100.0)
Monocytes Absolute: 0.2 10*3/uL (ref 0.1–1.0)
Monocytes Relative: 8 %
Neutro Abs: 1.1 10*3/uL — ABNORMAL LOW (ref 1.7–7.7)
Neutrophils Relative %: 53 %
Platelet Count: 132 10*3/uL — ABNORMAL LOW (ref 150–400)
RBC: 4.24 MIL/uL (ref 3.87–5.11)
RDW: 15.8 % — ABNORMAL HIGH (ref 11.5–15.5)
WBC Count: 2.1 10*3/uL — ABNORMAL LOW (ref 4.0–10.5)
nRBC: 0 % (ref 0.0–0.2)

## 2019-07-15 LAB — CMP (CANCER CENTER ONLY)
ALT: 14 U/L (ref 0–44)
AST: 15 U/L (ref 15–41)
Albumin: 3.5 g/dL (ref 3.5–5.0)
Alkaline Phosphatase: 125 U/L (ref 38–126)
Anion gap: 10 (ref 5–15)
BUN: 8 mg/dL (ref 6–20)
CO2: 24 mmol/L (ref 22–32)
Calcium: 9 mg/dL (ref 8.9–10.3)
Chloride: 108 mmol/L (ref 98–111)
Creatinine: 0.72 mg/dL (ref 0.44–1.00)
GFR, Est AFR Am: >60 mL/min (ref >60)
GFR, Estimated: >60 mL/min (ref >60)
Glucose, Bld: 147 mg/dL — ABNORMAL HIGH (ref 70–99)
Potassium: 3.9 mmol/L (ref 3.5–5.1)
Sodium: 142 mmol/L (ref 135–145)
Total Bilirubin: 0.6 mg/dL (ref 0.3–1.2)
Total Protein: 6.3 g/dL — ABNORMAL LOW (ref 6.5–8.1)

## 2019-07-15 LAB — MAGNESIUM: Magnesium: 1.5 mg/dL — ABNORMAL LOW (ref 1.7–2.4)

## 2019-07-15 LAB — CEA (IN HOUSE-CHCC): CEA (CHCC-In House): 14.23 ng/mL — ABNORMAL HIGH (ref 0.00–5.00)

## 2019-07-15 MED ORDER — PALONOSETRON HCL INJECTION 0.25 MG/5ML
0.2500 mg | Freq: Once | INTRAVENOUS | Status: AC
  Administered 2019-07-15: 0.25 mg via INTRAVENOUS

## 2019-07-15 MED ORDER — ATROPINE SULFATE 1 MG/ML IJ SOLN
INTRAMUSCULAR | Status: AC
  Filled 2019-07-15: qty 1

## 2019-07-15 MED ORDER — MAGNESIUM SULFATE 2 GM/50ML IV SOLN
INTRAVENOUS | Status: AC
  Filled 2019-07-15: qty 50

## 2019-07-15 MED ORDER — PROCHLORPERAZINE MALEATE 10 MG PO TABS: 10.0000 mg | ORAL_TABLET | Freq: Four times a day (QID) | ORAL | 3 refills | Status: DC | PRN

## 2019-07-15 MED ORDER — PALONOSETRON HCL INJECTION 0.25 MG/5ML
INTRAVENOUS | Status: AC
  Filled 2019-07-15: qty 5

## 2019-07-15 MED ORDER — SODIUM CHLORIDE 0.9 % IV SOLN
140.0000 mg/m2 | Freq: Once | INTRAVENOUS | Status: AC
  Administered 2019-07-15: 260 mg via INTRAVENOUS
  Filled 2019-07-15: qty 13

## 2019-07-15 MED ORDER — SODIUM CHLORIDE 0.9 % IV SOLN
6.0000 mg/kg | Freq: Once | INTRAVENOUS | Status: AC
  Administered 2019-07-15: 400 mg via INTRAVENOUS
  Filled 2019-07-15: qty 20

## 2019-07-15 MED ORDER — SODIUM CHLORIDE 0.9% FLUSH
10.0000 mL | Freq: Once | INTRAVENOUS | Status: AC
  Administered 2019-07-15: 10 mL
  Filled 2019-07-15: qty 10

## 2019-07-15 MED ORDER — SODIUM CHLORIDE 0.9 % IV SOLN
10.0000 mg | Freq: Once | INTRAVENOUS | Status: AC
  Administered 2019-07-15: 10 mg via INTRAVENOUS
  Filled 2019-07-15: qty 10

## 2019-07-15 MED ORDER — MAGNESIUM SULFATE 2 GM/50ML IV SOLN
2.0000 g | Freq: Once | INTRAVENOUS | Status: AC
  Administered 2019-07-15: 2 g via INTRAVENOUS

## 2019-07-15 MED ORDER — FLUOROURACIL CHEMO INJECTION 2.5 GM/50ML
400.0000 mg/m2 | Freq: Once | INTRAVENOUS | Status: AC
  Administered 2019-07-15: 700 mg via INTRAVENOUS
  Filled 2019-07-15: qty 14

## 2019-07-15 MED ORDER — SODIUM CHLORIDE 0.9 % IV SOLN
Freq: Once | INTRAVENOUS | Status: AC
  Filled 2019-07-15: qty 250

## 2019-07-15 MED ORDER — SODIUM CHLORIDE 0.9 % IV SOLN
2400.0000 mg/m2 | INTRAVENOUS | Status: DC
  Administered 2019-07-15: 4300 mg via INTRAVENOUS
  Filled 2019-07-15: qty 86

## 2019-07-15 MED ORDER — SODIUM CHLORIDE 0.9 % IV SOLN
400.0000 mg/m2 | Freq: Once | INTRAVENOUS | Status: AC
  Administered 2019-07-15: 716 mg via INTRAVENOUS
  Filled 2019-07-15: qty 35.8

## 2019-07-15 MED ORDER — ATROPINE SULFATE 1 MG/ML IJ SOLN
0.5000 mg | Freq: Once | INTRAMUSCULAR | Status: AC | PRN
  Administered 2019-07-15: 0.5 mg via INTRAVENOUS

## 2019-07-15 NOTE — Progress Notes (Signed)
07/15/19  Orders received to give magnesium sulfate 2 gm IVPB x 1 dose today while in clinic. Mag level 1.5   T.O. Ned Card, NP/Skyllar Notarianni Ronnald Ramp, PharmD

## 2019-07-15 NOTE — Patient Instructions (Signed)
[place] Discharge Instructions for Patients Receiving Chemotherapy  Today you received the following chemotherapy agents Vectibix, Irinotecan, Leucovorin and Adrucil  To help prevent nausea and vomiting after your treatment, we encourage you to take your nausea medication as directed.    If you develop nausea and vomiting that is not controlled by your nausea medication, call the clinic.   BELOW ARE SYMPTOMS THAT SHOULD BE REPORTED IMMEDIATELY:  *FEVER GREATER THAN 100.5 F  *CHILLS WITH OR WITHOUT FEVER  NAUSEA AND VOMITING THAT IS NOT CONTROLLED WITH YOUR NAUSEA MEDICATION  *UNUSUAL SHORTNESS OF BREATH  *UNUSUAL BRUISING OR BLEEDING  TENDERNESS IN MOUTH AND THROAT WITH OR WITHOUT PRESENCE OF ULCERS  *URINARY PROBLEMS  *BOWEL PROBLEMS  UNUSUAL RASH Items with * indicate a potential emergency and should be followed up as soon as possible.  Feel free to call the clinic should you have any questions or concerns. The clinic phone number is [phone].  Please show the [place] at check-in to the Emergency Department and triage nurse.   Hypomagnesemia Hypomagnesemia is a condition in which the level of magnesium in the blood is low. Magnesium is a mineral that is found in many foods. It is used in many different processes in the body. Hypomagnesemia can affect every organ in the body. In severe cases, it can cause life-threatening problems. What are the causes? This condition may be caused by:  Not getting enough magnesium in your diet.  Malnutrition.  Problems with absorbing magnesium from the intestines.  Dehydration.  Alcohol abuse.  Vomiting.  Severe or chronic diarrhea.  Some medicines, including medicines that make you urinate more (diuretics).  Certain diseases, such as kidney disease, diabetes, celiac disease, and overactive thyroid. What are the signs or symptoms? Symptoms of this condition include:  Loss of  appetite.  Nausea and vomiting.  Involuntary shaking or trembling of a body part (tremor).  Muscle weakness.  Tingling in the arms and legs.  Sudden tightening of muscles (muscle spasms).  Confusion.  Psychiatric issues, such as depression, irritability, or psychosis.  A feeling of fluttering of the heart.  Seizures. These symptoms are more severe if magnesium levels drop suddenly. How is this diagnosed? This condition may be diagnosed based on:  Your symptoms and medical history.  A physical exam.  Blood and urine tests. How is this treated? Treatment depends on the cause and the severity of the condition. It may be treated with:  A magnesium supplement. This can be taken in pill form. If the condition is severe, magnesium is usually given through an IV.  Changes to your diet. You may be directed to eat foods that have a lot of magnesium, such as green leafy vegetables, peas, beans, and nuts.  Stopping any intake of alcohol. Follow these instructions at home:      Make sure that your diet includes foods with magnesium. Foods that have a lot of magnesium in them include: ? Green leafy vegetables, such as spinach and broccoli. ? Beans and peas. ? Nuts and seeds, such as almonds and sunflower seeds. ? Whole grains, such as whole grain bread and fortified cereals.  Take magnesium supplements if your health care provider tells you to do that. Take them as directed.  Take over-the-counter and prescription medicines only as told by your health care provider.  Have your magnesium levels monitored as told by your health care provider.  When you are active, drink fluids that contain electrolytes.  Avoid drinking  alcohol.  Keep all follow-up visits as told by your health care provider. This is important. Contact a health care provider if:  You get worse instead of better.  Your symptoms return. Get help right away if you:  Develop severe muscle weakness.  Have  trouble breathing.  Feel that your heart is racing. Summary  Hypomagnesemia is a condition in which the level of magnesium in the blood is low.  Hypomagnesemia can affect every organ in the body.  Treatment may include eating more foods that contain magnesium, taking magnesium supplements, and not drinking alcohol.  Have your magnesium levels monitored as told by your health care provider. This information is not intended to replace advice given to you by your health care provider. Make sure you discuss any questions you have with your health care provider. Document Revised: 01/04/2017 Document Reviewed: 12/24/2016 Elsevier Patient Education  2020 [employer].

## 2019-07-15 NOTE — Progress Notes (Addendum)
[place] OFFICE PROGRESS NOTE   Diagnosis: Colon cancer  INTERVAL HISTORY:   Ms. Buffalo returns as scheduled.  She completed cycle 7 FOLFIRI/Panitumumab 07/01/2019.  She denies nausea/vomiting.  She tends to develop a few mouth sores on day 2.  The sores resolve over a few days and not interfere with eating or drinking.  She develops diarrhea on day 1 or 2.  She takes Lomotil with good control.  In total the diarrhea tends to last 3 to 4 days.  Skin rash is better since resuming doxycycline.  She notes an "ingrown toenail" involving the right great toe.  She reports this was partially removed recently.  Fistula is healing.  She notes less drainage.  Objective:  Vital signs in last 24 hours:  Blood pressure 117/70, pulse 65, temperature 97.6 F (36.4 C), temperature source Temporal, resp. rate 18, height 5' 6"  (1.676 m), weight 148 lb 3.2 oz (67.2 kg), SpO2 100 %.    HEENT: No thrush or ulcers. Resp: Lungs clear bilaterally. Cardio: Regular rate and rhythm. GI: Abdomen soft and nontender.  No hepatomegaly.  Left lower quadrant fistula appears to be healing.  No erythema. Vascular: No leg edema.  Skin: Medial edge right great toenail with mild erythema.  Some type of ointment is covering.  Heels with healing linear breaks in the skin.  No significant facial rash.  Scattered small acne type lesions upper extremities. Port-A-Cath without erythema.  Lab Results:  Lab Results  Component Value Date   WBC 2.1 (L) 07/15/2019   HGB 10.3 (L) 07/15/2019   HCT 33.3 (L) 07/15/2019   MCV 78.5 (L) 07/15/2019   PLT 132 (L) 07/15/2019   NEUTROABS 1.1 (L) 07/15/2019    Imaging:  No results found.  Medications: I have reviewed the patient's current medications.  Assessment/Plan: 1. 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  The tumor returned microsatellite stable with no  loss of mismatch repair protein expression   APC mutated. No BRAF, KRAS, or NRAS mutation On Foundation 1 testing   Cycle 1 adjuvant FOLFOX 09/08/2013   Cycle 2 adjuvant FOLFOX 09/24/2013   Cycle 3 adjuvant FOLFOX 10/08/2013.   Cycle 4 adjuvant FOLFOX 10/22/2013.   Cycle 5 adjuvant FOLFOX 11/05/2013. Oxaliplatin held due to thrombocytopenia.  Cycle 6 FOLFOX 11/19/2013.  Cycle 7 FOLFOX 12/03/2013. Oxaliplatin held secondary to thrombocytopenia.  Cycle 8 FOLFOX 12/17/2013.  Cycle 9 FOLFOX 01/04/2014. Oxaliplatin held secondary to neutropenia.   Cycle 10 FOLFOX 01/21/2014. Oxaliplatin held secondary to thrombocytopenia.  Cycle 11 FOLFOX 02/04/2014  Cycle 12 FOLFOX 02/18/2014, oxaliplatin dose reduced secondary tothrombocytopenia  CT abdomen/pelvis 01/30/2014 revealed splenomegaly and no evidence of recurrent colon cancer  CT chest 04/07/2014 with a stable right lower lobe nodule and no evidence for metastatic disease, no nodules seen on the CT 11/26/2014  Markedly elevated CEA 11/24/2014  CT 11/26/2014 revealed a right pelvic mass, splenomegaly, small volume ascites  Right salpingo-oophorectomy 12/28/2014 with the pathology confirming metastatic colon cancer  CTs 03/23/2016-no evidence of recurrent or metastatic disease.  CT 11/26/2016-enlargement of a fluid density structure the right pelvic sidewall, no other evidence of metastatic disease  CT aspiration right pelvic cyst 12/19/2016.Cytology-BENIGN REACTIVE/REPARATIVE CHANGES.  CTs 06/05/2017-no evidence of metastatic disease, mild cirrhotic changes with splenomegaly  CTs 12/11/2017-recurrent cystic right adnexal mass, similar to on the CT 11/26/2016, stable mild splenomegaly  CTs 04/23/2018-enlargement of cystic right adnexal mass with mural nodularity, no other evidence  of metastatic disease  Cytoreductive surgery/HIPEC with mitomycin by Dr. Clovis Riley at [facility] 08/12/2018-R1 resection achieved. Cytoreduction  included omentectomy, LAR, right salpingo-oophorectomy and left colonic gutter/pelvic stripping. Pathology on the rectum showed recurrent/metastatic adenocarcinoma, tumor 2.0 cm, predominantly involving the subserosa and muscularis propria of the colon, proximal and distal margins of resection were negative, vascular invasion present, metastatic carcinoma present in 1 out of 5 lymph nodes; omentum resection with no malignancy seen, no metastatic carcinoma identified in 1 lymph node examined; left gutter stripping positive metastatic adenocarcinoma; right ovary resection positive metastatic adenocarcinoma.  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  CEA 68 02/03/2019  Biopsy liver lesion 02/11/2019-metastatic adenocarcinoma consistent with primary colonic adenocarcinoma  CTs 02/27/2019-multiple liver lesions increased in size, new lesion in the lateral right lobe of the liver. No evidence of metastatic disease in the chest. Redemonstrated moderate left hydronephrosis and proximal hydroureter without discrete lesion or obstructing etiology at the transition point of the mid ureter.  Cycle 1 FOLFIRI/Panitumumab 03/12/2019  Cycle 2 FOLFIRI/Panitumumab 03/26/2019-bolus 5-FU and irinotecan held secondary to neutropenia  Cycle 3 FOLFIRI/Panitumumab 04/09/2019, Udenyca added-not given secondary to seizure/discontinuation of the 5-FU pump  CT abdomen/pelvis at [facility] 05/04/2019-no residual fluid collection at the left abdominal wall abscess, stable moderate left hydronephrosis, multifocal indeterminate liver lesions--liver lesionssignificantly improved  Cycle 4 FOLFIRI/Panitumumab 05/07/2019, Udenyca  Cycle 5 FOLFIRI/Panitumumab 05/20/2019, Udenyca  Cycle 6 FOLFIRI/Panitumumab 06/18/2019, Udenyca  Cycle 7 FOLFIRI/Panitumumab 07/01/2019 (Irinotecan dose reduced due to thrombocytopenia), Udenyca--Udenyca was not given  Cycle 8  FOLFIRI/Panitumumab 07/15/2019, Udenyca 2. Mild elevation of the CEA beginning January 2016, normal on 05/19/2014   3. History of iron deficiency anemia  4. seizure disorder; seizure 04/09/2019. Brain CT negative. Now on Keppra. 5. history of depression  6. 4 mm right lower lobe nodule on a staging chest CT 09/08/2013 , stable on a CT 04/07/2014 7. Hospitalization 09/24/2013 through 09/26/2013 with fever and abdominal pain.  8. 09/24/2013 urine culture positive for coag negative staph.  9. 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 Splenomegaly noted on a CT scan 01/30/2014,persistent on repeat CTs 12. Colonoscopy 11/17/2018-flexible sigmoidoscopy per rectum with changes of mild diversion colitis. Scope advanced for approximately 25 cm. Most proximal portion had necrotic appearing debris. Colostomy bag insufflated suggesting some type of communication between the pouch and the proximal colon. Introduction of scope into the ostomy found to available directions. 1 toward the distal pouch with similar appearing necrotic debris encountered. The other was about a 30 cm segment of normal-appearing colonic mucosa to the level of the previous right hemicolectomy ileocolonic anastomosis. 75. Port-A-Cath placement interventional radiology 03/05/2019 14. 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 Geno appears stable.  She has completed 7 cycles of FOLFIRI/Panitumumab.  There is no clinical evidence of disease progression.  Most  recent CEA was further improved.  Unfortunately she did not receive white cell growth factor support following cycle 7.  She has mild neutropenia.  We reviewed options to include proceeding with treatment today as scheduled, white cell growth factor support on day of pump DC versus delaying treatment for 1 week.  She  understands the risk of infection.  She would like to proceed with treatment today as scheduled, white cell growth factor support on day of pump DC.  She understands to contact the office with fever, chills, other signs of infection.  We reviewed the labs from today.  She has persistent hypomagnesemia.  She continues magnesium oxide 400 mg twice daily.  She will receive IV supplementation today as well.  The "ingrown toenail" is likely paronychia related to Panitumumab.  She will keep the area clean and dry, apply Neosporin.  She will contact the office if symptoms worsen.  She will return for lab, follow-up, cycle 9 FOLFIRI/Panitumumab in 2 weeks.  She will contact the office in the interim as outlined above or with any other problems.  Patient seen with Dr. Benay Spice.   Ned Card ANP/GNP-BC   07/15/2019  9:29 AM  This was a shared visit with Ned Card.  Ms. Faust appears stable.  She is tolerating the chemotherapy well.  She did not receive G-CSF as ordered with the last cycle of chemotherapy.  We discussed a treatment delay versus proceeding with FOLFIRI/Panitumumab today.  She would like to proceed with treatment.  She will receive G-CSF with this cycle.  Julieanne Manson, MD       This was a shared

## 2019-07-17 ENCOUNTER — Inpatient Hospital Stay: Payer: BC Managed Care – PPO

## 2019-07-17 ENCOUNTER — Other Ambulatory Visit: Payer: Self-pay

## 2019-07-17 VITALS — BP 103/63 | HR 68 | Temp 98.1°F | Resp 18

## 2019-07-17 DIAGNOSIS — D709 Neutropenia, unspecified: Secondary | ICD-10-CM | POA: Diagnosis not present

## 2019-07-17 DIAGNOSIS — D696 Thrombocytopenia, unspecified: Secondary | ICD-10-CM | POA: Diagnosis not present

## 2019-07-17 DIAGNOSIS — Z452 Encounter for adjustment and management of vascular access device: Secondary | ICD-10-CM | POA: Diagnosis not present

## 2019-07-17 DIAGNOSIS — C7961 Secondary malignant neoplasm of right ovary: Secondary | ICD-10-CM | POA: Diagnosis not present

## 2019-07-17 DIAGNOSIS — C787 Secondary malignant neoplasm of liver and intrahepatic bile duct: Secondary | ICD-10-CM | POA: Diagnosis not present

## 2019-07-17 DIAGNOSIS — Z5189 Encounter for other specified aftercare: Secondary | ICD-10-CM | POA: Diagnosis not present

## 2019-07-17 DIAGNOSIS — C182 Malignant neoplasm of ascending colon: Secondary | ICD-10-CM

## 2019-07-17 DIAGNOSIS — K632 Fistula of intestine: Secondary | ICD-10-CM | POA: Diagnosis not present

## 2019-07-17 DIAGNOSIS — R197 Diarrhea, unspecified: Secondary | ICD-10-CM | POA: Diagnosis not present

## 2019-07-17 DIAGNOSIS — Z5112 Encounter for antineoplastic immunotherapy: Secondary | ICD-10-CM | POA: Diagnosis not present

## 2019-07-17 DIAGNOSIS — Z5111 Encounter for antineoplastic chemotherapy: Secondary | ICD-10-CM | POA: Diagnosis not present

## 2019-07-17 MED ORDER — PEGFILGRASTIM-CBQV 6 MG/0.6ML ~~LOC~~ SOSY
6.0000 mg | PREFILLED_SYRINGE | Freq: Once | SUBCUTANEOUS | Status: AC
  Administered 2019-07-17: 6 mg via SUBCUTANEOUS

## 2019-07-17 MED ORDER — HEPARIN SOD (PORK) LOCK FLUSH 100 UNIT/ML IV SOLN
500.0000 [IU] | Freq: Once | INTRAVENOUS | Status: AC | PRN
  Administered 2019-07-17: 500 [IU]
  Filled 2019-07-17: qty 5

## 2019-07-17 MED ORDER — SODIUM CHLORIDE 0.9% FLUSH
10.0000 mL | INTRAVENOUS | Status: DC | PRN
  Administered 2019-07-17: 10 mL
  Filled 2019-07-17: qty 10

## 2019-07-17 MED ORDER — PEGFILGRASTIM-CBQV 6 MG/0.6ML ~~LOC~~ SOSY
PREFILLED_SYRINGE | SUBCUTANEOUS | Status: AC
  Filled 2019-07-17: qty 0.6

## 2019-07-25 ENCOUNTER — Other Ambulatory Visit: Payer: Self-pay | Admitting: Oncology

## 2019-07-27 ENCOUNTER — Other Ambulatory Visit: Payer: Self-pay | Admitting: Oncology

## 2019-07-27 DIAGNOSIS — C787 Secondary malignant neoplasm of liver and intrahepatic bile duct: Secondary | ICD-10-CM

## 2019-07-27 DIAGNOSIS — C182 Malignant neoplasm of ascending colon: Secondary | ICD-10-CM

## 2019-07-27 MED ORDER — DIPHENOXYLATE-ATROPINE 2.5-0.025 MG PO TABS: 2.0000 | ORAL_TABLET | Freq: Four times a day (QID) | ORAL | 0 refills | Status: DC | PRN

## 2019-07-29 ENCOUNTER — Telehealth: Payer: Self-pay | Admitting: *Deleted

## 2019-07-29 ENCOUNTER — Other Ambulatory Visit: Payer: Self-pay

## 2019-07-29 ENCOUNTER — Inpatient Hospital Stay: Payer: BC Managed Care – PPO

## 2019-07-29 ENCOUNTER — Inpatient Hospital Stay (HOSPITAL_BASED_OUTPATIENT_CLINIC_OR_DEPARTMENT_OTHER): Payer: BC Managed Care – PPO | Admitting: Oncology

## 2019-07-29 VITALS — BP 113/65 | HR 93 | Temp 97.8°F | Resp 18 | Ht 66.0 in | Wt 149.5 lb

## 2019-07-29 DIAGNOSIS — C182 Malignant neoplasm of ascending colon: Secondary | ICD-10-CM

## 2019-07-29 DIAGNOSIS — C7961 Secondary malignant neoplasm of right ovary: Secondary | ICD-10-CM | POA: Diagnosis not present

## 2019-07-29 DIAGNOSIS — K632 Fistula of intestine: Secondary | ICD-10-CM | POA: Diagnosis not present

## 2019-07-29 DIAGNOSIS — Z5189 Encounter for other specified aftercare: Secondary | ICD-10-CM | POA: Diagnosis not present

## 2019-07-29 DIAGNOSIS — D696 Thrombocytopenia, unspecified: Secondary | ICD-10-CM | POA: Diagnosis not present

## 2019-07-29 DIAGNOSIS — Z5112 Encounter for antineoplastic immunotherapy: Secondary | ICD-10-CM | POA: Diagnosis not present

## 2019-07-29 DIAGNOSIS — C787 Secondary malignant neoplasm of liver and intrahepatic bile duct: Secondary | ICD-10-CM

## 2019-07-29 DIAGNOSIS — Z5111 Encounter for antineoplastic chemotherapy: Secondary | ICD-10-CM | POA: Diagnosis not present

## 2019-07-29 DIAGNOSIS — Z452 Encounter for adjustment and management of vascular access device: Secondary | ICD-10-CM | POA: Diagnosis not present

## 2019-07-29 DIAGNOSIS — Z95828 Presence of other vascular implants and grafts: Secondary | ICD-10-CM

## 2019-07-29 DIAGNOSIS — D709 Neutropenia, unspecified: Secondary | ICD-10-CM | POA: Diagnosis not present

## 2019-07-29 DIAGNOSIS — R197 Diarrhea, unspecified: Secondary | ICD-10-CM | POA: Diagnosis not present

## 2019-07-29 LAB — CBC WITH DIFFERENTIAL (CANCER CENTER ONLY)
Abs Immature Granulocytes: 0.19 10*3/uL — ABNORMAL HIGH (ref 0.00–0.07)
Basophils Absolute: 0 10*3/uL (ref 0.0–0.1)
Basophils Relative: 1 %
Eosinophils Absolute: 0.1 10*3/uL (ref 0.0–0.5)
Eosinophils Relative: 2 %
HCT: 35.8 % — ABNORMAL LOW (ref 36.0–46.0)
Hemoglobin: 10.9 g/dL — ABNORMAL LOW (ref 12.0–15.0)
Immature Granulocytes: 4 %
Lymphocytes Relative: 21 %
Lymphs Abs: 1 10*3/uL (ref 0.7–4.0)
MCH: 24.7 pg — ABNORMAL LOW (ref 26.0–34.0)
MCHC: 30.4 g/dL (ref 30.0–36.0)
MCV: 81 fL (ref 80.0–100.0)
Monocytes Absolute: 0.3 10*3/uL (ref 0.1–1.0)
Monocytes Relative: 6 %
Neutro Abs: 3.3 10*3/uL (ref 1.7–7.7)
Neutrophils Relative %: 66 %
Platelet Count: 120 10*3/uL — ABNORMAL LOW (ref 150–400)
RBC: 4.42 MIL/uL (ref 3.87–5.11)
RDW: 16.7 % — ABNORMAL HIGH (ref 11.5–15.5)
WBC Count: 5 10*3/uL (ref 4.0–10.5)
nRBC: 0 % (ref 0.0–0.2)

## 2019-07-29 LAB — CMP (CANCER CENTER ONLY)
ALT: 18 U/L (ref 0–44)
AST: 17 U/L (ref 15–41)
Albumin: 3.8 g/dL (ref 3.5–5.0)
Alkaline Phosphatase: 159 U/L — ABNORMAL HIGH (ref 38–126)
Anion gap: 8 (ref 5–15)
BUN: 7 mg/dL (ref 6–20)
CO2: 27 mmol/L (ref 22–32)
Calcium: 9.3 mg/dL (ref 8.9–10.3)
Chloride: 108 mmol/L (ref 98–111)
Creatinine: 0.71 mg/dL (ref 0.44–1.00)
GFR, Est AFR Am: >60 mL/min (ref >60)
GFR, Estimated: >60 mL/min (ref >60)
Glucose, Bld: 146 mg/dL — ABNORMAL HIGH (ref 70–99)
Potassium: 4 mmol/L (ref 3.5–5.1)
Sodium: 143 mmol/L (ref 135–145)
Total Bilirubin: 0.7 mg/dL (ref 0.3–1.2)
Total Protein: 6.4 g/dL — ABNORMAL LOW (ref 6.5–8.1)

## 2019-07-29 LAB — CEA (IN HOUSE-CHCC): CEA (CHCC-In House): 18.08 ng/mL — ABNORMAL HIGH (ref 0.00–5.00)

## 2019-07-29 LAB — MAGNESIUM: Magnesium: 1.4 mg/dL — CL (ref 1.7–2.4)

## 2019-07-29 MED ORDER — SODIUM CHLORIDE 0.9 % IV SOLN
6.0000 mg/kg | Freq: Once | INTRAVENOUS | Status: AC
  Administered 2019-07-29: 400 mg via INTRAVENOUS
  Filled 2019-07-29: qty 20

## 2019-07-29 MED ORDER — SODIUM CHLORIDE 0.9 % IV SOLN
2400.0000 mg/m2 | INTRAVENOUS | Status: DC
  Administered 2019-07-29: 4300 mg via INTRAVENOUS
  Filled 2019-07-29: qty 86

## 2019-07-29 MED ORDER — PALONOSETRON HCL INJECTION 0.25 MG/5ML
INTRAVENOUS | Status: AC
  Filled 2019-07-29: qty 5

## 2019-07-29 MED ORDER — ATROPINE SULFATE 0.4 MG/ML IJ SOLN
0.4000 mg | Freq: Once | INTRAMUSCULAR | Status: AC | PRN
  Administered 2019-07-29: 0.4 mg via INTRAVENOUS

## 2019-07-29 MED ORDER — MAGNESIUM SULFATE 4 GM/100ML IV SOLN
4.0000 g | Freq: Once | INTRAVENOUS | Status: AC
  Administered 2019-07-29: 4 g via INTRAVENOUS
  Filled 2019-07-29: qty 100

## 2019-07-29 MED ORDER — FLUOROURACIL CHEMO INJECTION 2.5 GM/50ML
400.0000 mg/m2 | Freq: Once | INTRAVENOUS | Status: AC
  Administered 2019-07-29: 700 mg via INTRAVENOUS
  Filled 2019-07-29: qty 14

## 2019-07-29 MED ORDER — SODIUM CHLORIDE 0.9% FLUSH
10.0000 mL | Freq: Once | INTRAVENOUS | Status: AC
  Administered 2019-07-29: 10 mL
  Filled 2019-07-29: qty 10

## 2019-07-29 MED ORDER — SODIUM CHLORIDE 0.9 % IV SOLN
400.0000 mg/m2 | Freq: Once | INTRAVENOUS | Status: AC
  Administered 2019-07-29: 716 mg via INTRAVENOUS
  Filled 2019-07-29: qty 35.8

## 2019-07-29 MED ORDER — SODIUM CHLORIDE 0.9 % IV SOLN
Freq: Once | INTRAVENOUS | Status: AC
  Filled 2019-07-29: qty 250

## 2019-07-29 MED ORDER — ATROPINE SULFATE 1 MG/ML IJ SOLN: 0.5000 mg | Freq: Once | INTRAMUSCULAR | Status: DC | PRN

## 2019-07-29 MED ORDER — SODIUM CHLORIDE 0.9 % IV SOLN
10.0000 mg | Freq: Once | INTRAVENOUS | Status: AC
  Administered 2019-07-29: 10 mg via INTRAVENOUS
  Filled 2019-07-29: qty 10

## 2019-07-29 MED ORDER — PALONOSETRON HCL INJECTION 0.25 MG/5ML
0.2500 mg | Freq: Once | INTRAVENOUS | Status: AC
  Administered 2019-07-29: 0.25 mg via INTRAVENOUS

## 2019-07-29 MED ORDER — ATROPINE SULFATE 0.4 MG/ML IJ SOLN
INTRAMUSCULAR | Status: AC
  Filled 2019-07-29: qty 1

## 2019-07-29 MED ORDER — SODIUM CHLORIDE 0.9 % IV SOLN
140.0000 mg/m2 | Freq: Once | INTRAVENOUS | Status: AC
  Administered 2019-07-29: 260 mg via INTRAVENOUS
  Filled 2019-07-29: qty 13

## 2019-07-29 NOTE — Progress Notes (Signed)
CRITICAL VALUE STICKER  CRITICAL VALUE: Mg 1.4   RECEIVER (on-site recipient of call):Reva Pinkley, RN  DATE & TIME NOTIFIED: 07/29/19 1020  MESSENGER (representative from lab): Marlon Pel, RN  MD NOTIFIED: Dr. Benay Spice  TIME OF NOTIFICATION: 1027  RESPONSE: Have pharmacy order IV Mg+ during tx today. OK to treat today. Continue oral Mg+ at home bid.

## 2019-07-29 NOTE — Progress Notes (Signed)
[place] OFFICE PROGRESS NOTE   Diagnosis: Colon cancer  INTERVAL HISTORY:   Ms. Epting complete another cycle of FOLFIRI/Panitumumab on 07/15/2019.  No nausea or mouth sores.  Diarrhea is controlled with Lomotil.  The fistula has reopened and is draining.  She covers the area with gauze.  She continues to have a pustular rash over the trunk and extremities.  She has noted hyperpigmentation in the axillae.  She has increased erythema at the right hand.  She is working.  Objective:  Vital signs in last 24 hours:  Blood pressure 113/65, pulse 93, temperature 97.8 F (36.6 C), temperature source Temporal, resp. rate 18, height 5' 6" (1.676 m), weight 149 lb 8 oz (67.8 kg), SpO2 96 %.    HEENT: Geographic tongue, no thrush, few areas of erythema over the posterior palate and upper pharynx Resp: Lungs clear bilaterally Cardio: Regular rate and rhythm GI: No hepatosplenomegaly, no mass, left mid abdomen 1.5 cm fistula opening with surrounding erythema Vascular: No leg edema  Skin: Dry pustular rash over the trunk, isolated pustular lesions over the extremities, mild erythema at the dorsum of the right hand/fingers, linear hyperpigmentation at skin folds in the axillae  Portacath/PICC-without erythema  Lab Results:  Lab Results  Component Value Date   WBC 5.0 07/29/2019   HGB 10.9 (L) 07/29/2019   HCT 35.8 (L) 07/29/2019   MCV 81.0 07/29/2019   PLT 120 (L) 07/29/2019   NEUTROABS 3.3 07/29/2019    CMP  Lab Results  Component Value Date   NA 143 07/29/2019   K 4.0 07/29/2019   CL 108 07/29/2019   CO2 27 07/29/2019   GLUCOSE 146 (H) 07/29/2019   BUN 7 07/29/2019   CREATININE 0.71 07/29/2019   CALCIUM 9.3 07/29/2019   PROT 6.4 (L) 07/29/2019   ALBUMIN 3.8 07/29/2019   AST 17 07/29/2019   ALT 18 07/29/2019   ALKPHOS 159 (H) 07/29/2019   BILITOT 0.7 07/29/2019   GFRNONAA >60 07/29/2019   GFRAA >60 07/29/2019    Lab Results  Component Value Date   CEA1  14.23 (H) 07/15/2019     Medications: I have reviewed the patient's current medications.   Assessment/Plan: 1. 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  The tumor returned microsatellite stable with no loss of mismatch repair protein expression   APC mutated. No BRAF, KRAS, or NRAS mutation On Foundation 1 testing   Cycle 1 adjuvant FOLFOX 09/08/2013   Cycle 2 adjuvant FOLFOX 09/24/2013   Cycle 3 adjuvant FOLFOX 10/08/2013.   Cycle 4 adjuvant FOLFOX 10/22/2013.   Cycle 5 adjuvant FOLFOX 11/05/2013. Oxaliplatin held due to thrombocytopenia.  Cycle 6 FOLFOX 11/19/2013.  Cycle 7 FOLFOX 12/03/2013. Oxaliplatin held secondary to thrombocytopenia.  Cycle 8 FOLFOX 12/17/2013.  Cycle 9 FOLFOX 01/04/2014. Oxaliplatin held secondary to neutropenia.   Cycle 10 FOLFOX 01/21/2014. Oxaliplatin held secondary to thrombocytopenia.  Cycle 11 FOLFOX 02/04/2014  Cycle 12 FOLFOX 02/18/2014, oxaliplatin dose reduced secondary tothrombocytopenia  CT abdomen/pelvis 01/30/2014 revealed splenomegaly and no evidence of recurrent colon cancer  CT chest 04/07/2014 with a stable right lower lobe nodule and no evidence for metastatic disease, no nodules seen on the CT 11/26/2014  Markedly elevated CEA 11/24/2014  CT 11/26/2014 revealed a right pelvic mass, splenomegaly, small volume ascites  Right salpingo-oophorectomy 12/28/2014 with the pathology confirming metastatic colon cancer  CTs 03/23/2016-no evidence of recurrent or metastatic disease.  CT 11/26/2016-enlargement of a fluid  density structure the right pelvic sidewall, no other evidence of metastatic disease  CT aspiration right pelvic cyst 12/19/2016.Cytology-BENIGN REACTIVE/REPARATIVE CHANGES.  CTs 06/05/2017-no evidence of metastatic disease, mild cirrhotic changes with splenomegaly  CTs 12/11/2017-recurrent cystic right  adnexal mass, similar to on the CT 11/26/2016, stable mild splenomegaly  CTs 04/23/2018-enlargement of cystic right adnexal mass with mural nodularity, no other evidence of metastatic disease  Cytoreductive surgery/HIPEC with mitomycin by Dr. Clovis Riley at [facility] 08/12/2018-R1 resection achieved. Cytoreduction included omentectomy, LAR, right salpingo-oophorectomy and left colonic gutter/pelvic stripping. Pathology on the rectum showed recurrent/metastatic adenocarcinoma, tumor 2.0 cm, predominantly involving the subserosa and muscularis propria of the colon, proximal and distal margins of resection were negative, vascular invasion present, metastatic carcinoma present in 1 out of 5 lymph nodes; omentum resection with no malignancy seen, no metastatic carcinoma identified in 1 lymph node examined; left gutter stripping positive metastatic adenocarcinoma; right ovary resection positive metastatic adenocarcinoma.  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  CEA 68 02/03/2019  Biopsy liver lesion 02/11/2019-metastatic adenocarcinoma consistent with primary colonic adenocarcinoma  CTs 02/27/2019-multiple liver lesions increased in size, new lesion in the lateral right lobe of the liver. No evidence of metastatic disease in the chest. Redemonstrated moderate left hydronephrosis and proximal hydroureter without discrete lesion or obstructing etiology at the transition point of the mid ureter.  Cycle 1 FOLFIRI/Panitumumab 03/12/2019  Cycle 2 FOLFIRI/Panitumumab 03/26/2019-bolus 5-FU and irinotecan held secondary to neutropenia  Cycle 3 FOLFIRI/Panitumumab 04/09/2019, Udenyca added-not given secondary to seizure/discontinuation of the 5-FU pump  CT abdomen/pelvis at [facility] 05/04/2019-no residual fluid collection at the left abdominal wall abscess, stable moderate left hydronephrosis, multifocal indeterminate liver lesions--liver  lesionssignificantly improved  Cycle 4 FOLFIRI/Panitumumab 05/07/2019, Udenyca  Cycle 5 FOLFIRI/Panitumumab 05/20/2019, Udenyca  Cycle 6 FOLFIRI/Panitumumab 06/18/2019, Udenyca  Cycle 7 FOLFIRI/Panitumumab 07/01/2019 (Irinotecan dose reduced due to thrombocytopenia), Udenyca--Udenyca was not given  Cycle 8 FOLFIRI/Panitumumab 07/15/2019, Udenyca  Cycle 9 FOLFIRI/Panitumumab 07/29/2019, Udenyca 2. Mild elevation of the CEA beginning January 2016, normal on 05/19/2014   3. History of iron deficiency anemia  4. seizure disorder; seizure 04/09/2019. Brain CT negative. Now on Keppra. 5. history of depression  6. 4 mm right lower lobe nodule on a staging chest CT 09/08/2013 , stable on a CT 04/07/2014 7. Hospitalization 09/24/2013 through 09/26/2013 with fever and abdominal pain.  8. 09/24/2013 urine culture positive for coag negative staph.  9. 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 Splenomegaly noted on a CT scan 01/30/2014,persistent on repeat CTs 12. Colonoscopy 11/17/2018-flexible sigmoidoscopy per rectum with changes of mild diversion colitis. Scope advanced for approximately 25 cm. Most proximal portion had necrotic appearing debris. Colostomy bag insufflated suggesting some type of communication between the pouch and the proximal colon. Introduction of scope into the ostomy found to available directions. 1 toward the distal pouch with similar appearing necrotic debris encountered. The other was about a 30 cm segment of normal-appearing colonic mucosa to the level of the previous right hemicolectomy ileocolonic anastomosis. 40. Port-A-Cath placement interventional radiology 03/05/2019 14. 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  She will complete another cycle today.  The left abdomen fistula has reopened.  She is scheduled to see Dr. Clovis Riley for an office visit and CT evaluation on 08/19/2019.  She will continue doxycycline.  Ms. Lutze will return for an office visit and chemotherapy in 2 weeks.  Betsy Coder, MD  07/29/2019  10:31 AM

## 2019-07-29 NOTE — Telephone Encounter (Signed)
Panic lab result called per lab phone for magnesium of 1.4.  Written result given directly to Dr Gearldine Shown collaborative nurse , Cristy Friedlander.

## 2019-07-29 NOTE — Progress Notes (Signed)
Per Dr. Benay Spice, patient to receive magnesium IV today. Give magnesium sulfate 4 grams IV x1 today for level of 1.4. Patient is to continue her oral magnesium supplement at home.   Demetrius Charity, PharmD, BCPS, [facility] Pharmacist Pharmacy Phone: [phone] 07/29/2019

## 2019-07-30 ENCOUNTER — Telehealth: Payer: Self-pay | Admitting: Oncology

## 2019-07-30 NOTE — Telephone Encounter (Signed)
Scheduled per 6/23 los. Noted to give pt updated calendar.

## 2019-07-31 ENCOUNTER — Other Ambulatory Visit: Payer: Self-pay

## 2019-07-31 ENCOUNTER — Inpatient Hospital Stay: Payer: BC Managed Care – PPO

## 2019-07-31 VITALS — BP 116/72 | HR 88 | Temp 98.4°F

## 2019-07-31 DIAGNOSIS — Z5111 Encounter for antineoplastic chemotherapy: Secondary | ICD-10-CM | POA: Diagnosis not present

## 2019-07-31 DIAGNOSIS — C7961 Secondary malignant neoplasm of right ovary: Secondary | ICD-10-CM | POA: Diagnosis not present

## 2019-07-31 DIAGNOSIS — C182 Malignant neoplasm of ascending colon: Secondary | ICD-10-CM

## 2019-07-31 DIAGNOSIS — Z5189 Encounter for other specified aftercare: Secondary | ICD-10-CM | POA: Diagnosis not present

## 2019-07-31 DIAGNOSIS — Z452 Encounter for adjustment and management of vascular access device: Secondary | ICD-10-CM | POA: Diagnosis not present

## 2019-07-31 DIAGNOSIS — D696 Thrombocytopenia, unspecified: Secondary | ICD-10-CM | POA: Diagnosis not present

## 2019-07-31 DIAGNOSIS — K632 Fistula of intestine: Secondary | ICD-10-CM | POA: Diagnosis not present

## 2019-07-31 DIAGNOSIS — R197 Diarrhea, unspecified: Secondary | ICD-10-CM | POA: Diagnosis not present

## 2019-07-31 DIAGNOSIS — C787 Secondary malignant neoplasm of liver and intrahepatic bile duct: Secondary | ICD-10-CM | POA: Diagnosis not present

## 2019-07-31 DIAGNOSIS — Z5112 Encounter for antineoplastic immunotherapy: Secondary | ICD-10-CM | POA: Diagnosis not present

## 2019-07-31 DIAGNOSIS — D709 Neutropenia, unspecified: Secondary | ICD-10-CM | POA: Diagnosis not present

## 2019-07-31 MED ORDER — HEPARIN SOD (PORK) LOCK FLUSH 100 UNIT/ML IV SOLN
500.0000 [IU] | Freq: Once | INTRAVENOUS | Status: AC | PRN
  Administered 2019-07-31: 500 [IU]
  Filled 2019-07-31: qty 5

## 2019-07-31 MED ORDER — PEGFILGRASTIM-CBQV 6 MG/0.6ML ~~LOC~~ SOSY
PREFILLED_SYRINGE | SUBCUTANEOUS | Status: AC
  Filled 2019-07-31: qty 0.6

## 2019-07-31 MED ORDER — PEGFILGRASTIM-CBQV 6 MG/0.6ML ~~LOC~~ SOSY
6.0000 mg | PREFILLED_SYRINGE | Freq: Once | SUBCUTANEOUS | Status: AC
  Administered 2019-07-31: 6 mg via SUBCUTANEOUS

## 2019-07-31 MED ORDER — SODIUM CHLORIDE 0.9% FLUSH
10.0000 mL | INTRAVENOUS | Status: DC | PRN
  Administered 2019-07-31: 10 mL
  Filled 2019-07-31: qty 10

## 2019-07-31 NOTE — Patient Instructions (Signed)

## 2019-08-04 ENCOUNTER — Encounter: Payer: Self-pay | Admitting: Oncology

## 2019-08-05 ENCOUNTER — Other Ambulatory Visit: Payer: Self-pay | Admitting: *Deleted

## 2019-08-05 DIAGNOSIS — C182 Malignant neoplasm of ascending colon: Secondary | ICD-10-CM

## 2019-08-06 ENCOUNTER — Inpatient Hospital Stay: Payer: BC Managed Care – PPO | Attending: Oncology

## 2019-08-06 ENCOUNTER — Other Ambulatory Visit: Payer: Self-pay

## 2019-08-06 DIAGNOSIS — K632 Fistula of intestine: Secondary | ICD-10-CM | POA: Diagnosis not present

## 2019-08-06 DIAGNOSIS — Z5111 Encounter for antineoplastic chemotherapy: Secondary | ICD-10-CM | POA: Diagnosis not present

## 2019-08-06 DIAGNOSIS — R21 Rash and other nonspecific skin eruption: Secondary | ICD-10-CM | POA: Diagnosis not present

## 2019-08-06 DIAGNOSIS — R197 Diarrhea, unspecified: Secondary | ICD-10-CM | POA: Insufficient documentation

## 2019-08-06 DIAGNOSIS — Z5189 Encounter for other specified aftercare: Secondary | ICD-10-CM | POA: Diagnosis not present

## 2019-08-06 DIAGNOSIS — Z452 Encounter for adjustment and management of vascular access device: Secondary | ICD-10-CM | POA: Diagnosis not present

## 2019-08-06 DIAGNOSIS — D696 Thrombocytopenia, unspecified: Secondary | ICD-10-CM | POA: Insufficient documentation

## 2019-08-06 DIAGNOSIS — Z5112 Encounter for antineoplastic immunotherapy: Secondary | ICD-10-CM | POA: Insufficient documentation

## 2019-08-06 DIAGNOSIS — C787 Secondary malignant neoplasm of liver and intrahepatic bile duct: Secondary | ICD-10-CM | POA: Insufficient documentation

## 2019-08-06 DIAGNOSIS — C182 Malignant neoplasm of ascending colon: Secondary | ICD-10-CM | POA: Diagnosis not present

## 2019-08-06 DIAGNOSIS — D509 Iron deficiency anemia, unspecified: Secondary | ICD-10-CM | POA: Diagnosis not present

## 2019-08-06 LAB — CBC WITH DIFFERENTIAL (CANCER CENTER ONLY)
Abs Immature Granulocytes: 0.06 10*3/uL (ref 0.00–0.07)
Basophils Absolute: 0 10*3/uL (ref 0.0–0.1)
Basophils Relative: 0 %
Eosinophils Absolute: 0.1 10*3/uL (ref 0.0–0.5)
Eosinophils Relative: 1 %
HCT: 35.5 % — ABNORMAL LOW (ref 36.0–46.0)
Hemoglobin: 10.8 g/dL — ABNORMAL LOW (ref 12.0–15.0)
Immature Granulocytes: 1 %
Lymphocytes Relative: 15 %
Lymphs Abs: 1 10*3/uL (ref 0.7–4.0)
MCH: 24.5 pg — ABNORMAL LOW (ref 26.0–34.0)
MCHC: 30.4 g/dL (ref 30.0–36.0)
MCV: 80.7 fL (ref 80.0–100.0)
Monocytes Absolute: 0.4 10*3/uL (ref 0.1–1.0)
Monocytes Relative: 6 %
Neutro Abs: 5.3 10*3/uL (ref 1.7–7.7)
Neutrophils Relative %: 77 %
Platelet Count: 100 10*3/uL — ABNORMAL LOW (ref 150–400)
RBC: 4.4 MIL/uL (ref 3.87–5.11)
RDW: 16.4 % — ABNORMAL HIGH (ref 11.5–15.5)
WBC Count: 6.8 10*3/uL (ref 4.0–10.5)
nRBC: 0 % (ref 0.0–0.2)

## 2019-08-06 LAB — SAMPLE TO BLOOD BANK

## 2019-08-07 ENCOUNTER — Telehealth: Payer: Self-pay | Admitting: *Deleted

## 2019-08-07 NOTE — Telephone Encounter (Signed)
Notified of CBC-Hgb is stable. Call for recurrent or increase in bleeding. She reports no further bleeding. Her fistula has opened up again. Expresses frustration with this, saying "I'm so tired of this". She is aware of her appointments on 08/13/19.

## 2019-08-09 DIAGNOSIS — C182 Malignant neoplasm of ascending colon: Secondary | ICD-10-CM | POA: Diagnosis not present

## 2019-08-10 ENCOUNTER — Other Ambulatory Visit: Payer: Self-pay | Admitting: Oncology

## 2019-08-13 ENCOUNTER — Other Ambulatory Visit: Payer: Self-pay | Admitting: *Deleted

## 2019-08-13 ENCOUNTER — Inpatient Hospital Stay: Payer: BC Managed Care – PPO

## 2019-08-13 ENCOUNTER — Inpatient Hospital Stay (HOSPITAL_BASED_OUTPATIENT_CLINIC_OR_DEPARTMENT_OTHER): Payer: BC Managed Care – PPO | Admitting: Oncology

## 2019-08-13 ENCOUNTER — Other Ambulatory Visit: Payer: Self-pay

## 2019-08-13 VITALS — BP 122/71 | HR 78 | Temp 98.2°F | Resp 18 | Ht 66.0 in | Wt 146.9 lb

## 2019-08-13 DIAGNOSIS — Z5112 Encounter for antineoplastic immunotherapy: Secondary | ICD-10-CM | POA: Diagnosis not present

## 2019-08-13 DIAGNOSIS — C787 Secondary malignant neoplasm of liver and intrahepatic bile duct: Secondary | ICD-10-CM | POA: Diagnosis not present

## 2019-08-13 DIAGNOSIS — C182 Malignant neoplasm of ascending colon: Secondary | ICD-10-CM

## 2019-08-13 DIAGNOSIS — K632 Fistula of intestine: Secondary | ICD-10-CM | POA: Diagnosis not present

## 2019-08-13 DIAGNOSIS — Z452 Encounter for adjustment and management of vascular access device: Secondary | ICD-10-CM | POA: Diagnosis not present

## 2019-08-13 DIAGNOSIS — R21 Rash and other nonspecific skin eruption: Secondary | ICD-10-CM | POA: Diagnosis not present

## 2019-08-13 DIAGNOSIS — D509 Iron deficiency anemia, unspecified: Secondary | ICD-10-CM | POA: Diagnosis not present

## 2019-08-13 DIAGNOSIS — Z5189 Encounter for other specified aftercare: Secondary | ICD-10-CM | POA: Diagnosis not present

## 2019-08-13 DIAGNOSIS — D696 Thrombocytopenia, unspecified: Secondary | ICD-10-CM | POA: Diagnosis not present

## 2019-08-13 DIAGNOSIS — Z5111 Encounter for antineoplastic chemotherapy: Secondary | ICD-10-CM | POA: Diagnosis not present

## 2019-08-13 DIAGNOSIS — R197 Diarrhea, unspecified: Secondary | ICD-10-CM | POA: Diagnosis not present

## 2019-08-13 LAB — CBC WITH DIFFERENTIAL (CANCER CENTER ONLY)
Abs Immature Granulocytes: 0.47 10*3/uL — ABNORMAL HIGH (ref 0.00–0.07)
Basophils Absolute: 0 10*3/uL (ref 0.0–0.1)
Basophils Relative: 0 %
Eosinophils Absolute: 0.1 10*3/uL (ref 0.0–0.5)
Eosinophils Relative: 1 %
HCT: 37.9 % (ref 36.0–46.0)
Hemoglobin: 11.7 g/dL — ABNORMAL LOW (ref 12.0–15.0)
Immature Granulocytes: 7 %
Lymphocytes Relative: 16 %
Lymphs Abs: 1.2 10*3/uL (ref 0.7–4.0)
MCH: 24.9 pg — ABNORMAL LOW (ref 26.0–34.0)
MCHC: 30.9 g/dL (ref 30.0–36.0)
MCV: 80.8 fL (ref 80.0–100.0)
Monocytes Absolute: 0.3 10*3/uL (ref 0.1–1.0)
Monocytes Relative: 5 %
Neutro Abs: 4.9 10*3/uL (ref 1.7–7.7)
Neutrophils Relative %: 71 %
Platelet Count: 123 10*3/uL — ABNORMAL LOW (ref 150–400)
RBC: 4.69 MIL/uL (ref 3.87–5.11)
RDW: 17.2 % — ABNORMAL HIGH (ref 11.5–15.5)
WBC Count: 7 10*3/uL (ref 4.0–10.5)
nRBC: 0 % (ref 0.0–0.2)

## 2019-08-13 LAB — CMP (CANCER CENTER ONLY)
ALT: 20 U/L (ref 0–44)
AST: 21 U/L (ref 15–41)
Albumin: 3.8 g/dL (ref 3.5–5.0)
Alkaline Phosphatase: 170 U/L — ABNORMAL HIGH (ref 38–126)
Anion gap: 11 (ref 5–15)
BUN: 6 mg/dL (ref 6–20)
CO2: 26 mmol/L (ref 22–32)
Calcium: 9.4 mg/dL (ref 8.9–10.3)
Chloride: 108 mmol/L (ref 98–111)
Creatinine: 0.74 mg/dL (ref 0.44–1.00)
GFR, Est AFR Am: >60 mL/min (ref >60)
GFR, Estimated: >60 mL/min (ref >60)
Glucose, Bld: 150 mg/dL — ABNORMAL HIGH (ref 70–99)
Potassium: 4 mmol/L (ref 3.5–5.1)
Sodium: 145 mmol/L (ref 135–145)
Total Bilirubin: 0.7 mg/dL (ref 0.3–1.2)
Total Protein: 6.7 g/dL (ref 6.5–8.1)

## 2019-08-13 LAB — CEA (IN HOUSE-CHCC): CEA (CHCC-In House): 16.05 ng/mL — ABNORMAL HIGH (ref 0.00–5.00)

## 2019-08-13 LAB — MAGNESIUM: Magnesium: 1.3 mg/dL — CL (ref 1.7–2.4)

## 2019-08-13 MED ORDER — LORAZEPAM 0.5 MG PO TABS: 0.5000 mg | ORAL_TABLET | Freq: Two times a day (BID) | ORAL | 0 refills | Status: DC | PRN

## 2019-08-13 MED ORDER — SODIUM CHLORIDE 0.9 % IV SOLN
6.0000 mg/kg | Freq: Once | INTRAVENOUS | Status: AC
  Administered 2019-08-13: 400 mg via INTRAVENOUS
  Filled 2019-08-13: qty 20

## 2019-08-13 MED ORDER — FLUOROURACIL CHEMO INJECTION 2.5 GM/50ML
400.0000 mg/m2 | Freq: Once | INTRAVENOUS | Status: AC
  Administered 2019-08-13: 700 mg via INTRAVENOUS
  Filled 2019-08-13: qty 14

## 2019-08-13 MED ORDER — SODIUM CHLORIDE 0.9 % IV SOLN
10.0000 mg | Freq: Once | INTRAVENOUS | Status: AC
  Administered 2019-08-13: 10 mg via INTRAVENOUS
  Filled 2019-08-13: qty 10

## 2019-08-13 MED ORDER — SODIUM CHLORIDE 0.9 % IV SOLN
Freq: Once | INTRAVENOUS | Status: AC
  Filled 2019-08-13: qty 250

## 2019-08-13 MED ORDER — MAGNESIUM SULFATE 4 GM/100ML IV SOLN
4.0000 g | Freq: Once | INTRAVENOUS | Status: AC
  Administered 2019-08-13: 4 g via INTRAVENOUS
  Filled 2019-08-13: qty 100

## 2019-08-13 MED ORDER — SODIUM CHLORIDE 0.9 % IV SOLN
2400.0000 mg/m2 | INTRAVENOUS | Status: DC
  Administered 2019-08-13: 4300 mg via INTRAVENOUS
  Filled 2019-08-13: qty 86

## 2019-08-13 MED ORDER — SODIUM CHLORIDE 0.9 % IV SOLN
400.0000 mg/m2 | Freq: Once | INTRAVENOUS | Status: AC
  Administered 2019-08-13: 716 mg via INTRAVENOUS
  Filled 2019-08-13: qty 35.8

## 2019-08-13 MED ORDER — ATROPINE SULFATE 1 MG/ML IJ SOLN
0.5000 mg | Freq: Once | INTRAMUSCULAR | Status: AC | PRN
  Administered 2019-08-13: 0.5 mg via INTRAVENOUS

## 2019-08-13 MED ORDER — PALONOSETRON HCL INJECTION 0.25 MG/5ML
0.2500 mg | Freq: Once | INTRAVENOUS | Status: AC
  Administered 2019-08-13: 0.25 mg via INTRAVENOUS

## 2019-08-13 MED ORDER — ATROPINE SULFATE 1 MG/ML IJ SOLN
INTRAMUSCULAR | Status: AC
  Filled 2019-08-13: qty 1

## 2019-08-13 MED ORDER — SODIUM CHLORIDE 0.9% FLUSH
10.0000 mL | INTRAVENOUS | Status: DC | PRN
  Filled 2019-08-13: qty 10

## 2019-08-13 MED ORDER — PALONOSETRON HCL INJECTION 0.25 MG/5ML
INTRAVENOUS | Status: AC
  Filled 2019-08-13: qty 5

## 2019-08-13 MED ORDER — SODIUM CHLORIDE 0.9 % IV SOLN
140.0000 mg/m2 | Freq: Once | INTRAVENOUS | Status: AC
  Administered 2019-08-13: 260 mg via INTRAVENOUS
  Filled 2019-08-13: qty 13

## 2019-08-13 NOTE — Progress Notes (Signed)
Per Dr. Benay Spice: OK to treat with Mg+ 1.2. Will ask pharmacist to dose IV Mg+ replacement today.

## 2019-08-13 NOTE — Progress Notes (Deleted)
[place] OFFICE PROGRESS NOTE   Diagnosis:   INTERVAL HISTORY:   ***  Objective:  Vital signs in last 24 hours:  Blood pressure 122/71, pulse 78, temperature 98.2 F (36.8 C), temperature source Temporal, resp. rate 18, height 5' 6"  (1.676 m), weight 146 lb 14.4 oz (66.6 kg), SpO2 100 %.    HEENT: *** Lymphatics: *** Resp: *** Cardio: *** GI: *** Vascular: *** Neuro:***  Skin:***   Portacath/PICC-without erythema  Lab Results:  Lab Results  Component Value Date   WBC 7.0 08/13/2019   HGB 11.7 (L) 08/13/2019   HCT 37.9 08/13/2019   MCV 80.8 08/13/2019   PLT 123 (L) 08/13/2019   NEUTROABS 4.9 08/13/2019    CMP  Lab Results  Component Value Date   NA 143 07/29/2019   K 4.0 07/29/2019   CL 108 07/29/2019   CO2 27 07/29/2019   GLUCOSE 146 (H) 07/29/2019   BUN 7 07/29/2019   CREATININE 0.71 07/29/2019   CALCIUM 9.3 07/29/2019   PROT 6.4 (L) 07/29/2019   ALBUMIN 3.8 07/29/2019   AST 17 07/29/2019   ALT 18 07/29/2019   ALKPHOS 159 (H) 07/29/2019   BILITOT 0.7 07/29/2019   GFRNONAA >60 07/29/2019   GFRAA >60 07/29/2019    Lab Results  Component Value Date   CEA1 18.08 (H) 07/29/2019    Lab Results  Component Value Date   INR 1.0 02/11/2019    Imaging:  No results found.  Medications: I have reviewed the patient's current medications.   Assessment/Plan: 1. 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  The tumor returned microsatellite stable with no loss of mismatch repair protein expression   APC mutated. No BRAF, KRAS, or NRAS mutation On Foundation 1 testing   Cycle 1 adjuvant FOLFOX 09/08/2013   Cycle 2 adjuvant FOLFOX 09/24/2013   Cycle 3 adjuvant FOLFOX 10/08/2013.   Cycle 4 adjuvant FOLFOX 10/22/2013.   Cycle 5 adjuvant FOLFOX 11/05/2013. Oxaliplatin held due to thrombocytopenia.  Cycle 6 FOLFOX  11/19/2013.  Cycle 7 FOLFOX 12/03/2013. Oxaliplatin held secondary to thrombocytopenia.  Cycle 8 FOLFOX 12/17/2013.  Cycle 9 FOLFOX 01/04/2014. Oxaliplatin held secondary to neutropenia.   Cycle 10 FOLFOX 01/21/2014. Oxaliplatin held secondary to thrombocytopenia.  Cycle 11 FOLFOX 02/04/2014  Cycle 12 FOLFOX 02/18/2014, oxaliplatin dose reduced secondary tothrombocytopenia  CT abdomen/pelvis 01/30/2014 revealed splenomegaly and no evidence of recurrent colon cancer  CT chest 04/07/2014 with a stable right lower lobe nodule and no evidence for metastatic disease, no nodules seen on the CT 11/26/2014  Markedly elevated CEA 11/24/2014  CT 11/26/2014 revealed a right pelvic mass, splenomegaly, small volume ascites  Right salpingo-oophorectomy 12/28/2014 with the pathology confirming metastatic colon cancer  CTs 03/23/2016-no evidence of recurrent or metastatic disease.  CT 11/26/2016-enlargement of a fluid density structure the right pelvic sidewall, no other evidence of metastatic disease  CT aspiration right pelvic cyst 12/19/2016.Cytology-BENIGN REACTIVE/REPARATIVE CHANGES.  CTs 06/05/2017-no evidence of metastatic disease, mild cirrhotic changes with splenomegaly  CTs 12/11/2017-recurrent cystic right adnexal mass, similar to on the CT 11/26/2016, stable mild splenomegaly  CTs 04/23/2018-enlargement of cystic right adnexal mass with mural nodularity, no other evidence of metastatic disease  Cytoreductive surgery/HIPEC with mitomycin by Dr. Clovis Riley at [facility] 08/12/2018-R1 resection achieved. Cytoreduction included omentectomy, LAR, right salpingo-oophorectomy and left colonic gutter/pelvic stripping. Pathology on the rectum showed recurrent/metastatic adenocarcinoma, tumor 2.0 cm, predominantly involving the subserosa and muscularis propria of the colon, proximal  and distal margins of resection were negative, vascular invasion present, metastatic carcinoma present in 1 out of 5  lymph nodes; omentum resection with no malignancy seen, no metastatic carcinoma identified in 1 lymph node examined; left gutter stripping positive metastatic adenocarcinoma; right ovary resection positive metastatic adenocarcinoma.  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  CEA 68 02/03/2019  Biopsy liver lesion 02/11/2019-metastatic adenocarcinoma consistent with primary colonic adenocarcinoma  CTs 02/27/2019-multiple liver lesions increased in size, new lesion in the lateral right lobe of the liver. No evidence of metastatic disease in the chest. Redemonstrated moderate left hydronephrosis and proximal hydroureter without discrete lesion or obstructing etiology at the transition point of the mid ureter.  Cycle 1 FOLFIRI/Panitumumab 03/12/2019  Cycle 2 FOLFIRI/Panitumumab 03/26/2019-bolus 5-FU and irinotecan held secondary to neutropenia  Cycle 3 FOLFIRI/Panitumumab 04/09/2019, Udenyca added-not given secondary to seizure/discontinuation of the 5-FU pump  CT abdomen/pelvis [facility] 05/04/2019-no residual fluid collection at the left abdominal wall abscess, stable moderate left hydronephrosis, multifocal indeterminate liver lesions--liver lesionssignificantly improved  Cycle 4 FOLFIRI/Panitumumab 05/07/2019, Udenyca  Cycle 5 FOLFIRI/Panitumumab 05/20/2019, Udenyca  Cycle 6 FOLFIRI/Panitumumab 06/18/2019, Udenyca  Cycle 7 FOLFIRI/Panitumumab 07/01/2019 (Irinotecan dose reduced due to thrombocytopenia), Udenyca--Udenyca was not given  Cycle 8 FOLFIRI/Panitumumab 07/15/2019, Udenyca  Cycle 9 FOLFIRI/Panitumumab 07/29/2019, Udenyca 2. Mild elevation of the CEA beginning January 2016, normal on 05/19/2014   3. History of iron deficiency anemia  4. seizure disorder; seizure 04/09/2019. Brain CT negative. Now on Keppra. 5. history of depression  6. 4 mm right lower lobe nodule on a staging chest CT 09/08/2013 , stable  on a CT 04/07/2014 7. Hospitalization 09/24/2013 through 09/26/2013 with fever and abdominal pain.  8. 09/24/2013 urine culture positive for coag negative staph.  9. 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 Splenomegaly noted on a CT scan 01/30/2014,persistent on repeat CTs 12. Colonoscopy 11/17/2018-flexible sigmoidoscopy per rectum with changes of mild diversion colitis. Scope advanced for approximately 25 cm. Most proximal portion had necrotic appearing debris. Colostomy bag insufflated suggesting some type of communication between the pouch and the proximal colon. Introduction of scope into the ostomy found to available directions. 1 toward the distal pouch with similar appearing necrotic debris encountered. The other was about a 30 cm segment of normal-appearing colonic mucosa to the level of the previous right hemicolectomy ileocolonic anastomosis. 43. Port-A-Cath placement interventional radiology 03/05/2019 14. 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

## 2019-08-13 NOTE — Progress Notes (Signed)
Mg = 1.3 today. Will supplement w/ IV Mg Sulf 4 gm during infusion today.  Kennith Center, Pharm.D., CPP 08/13/2019@11 :43 AM

## 2019-08-13 NOTE — Progress Notes (Signed)
[place] OFFICE PROGRESS NOTE   Diagnosis: Colon cancer  INTERVAL HISTORY:   [name] returns as scheduled.  She repeat another cycle of FOLFIRI/Panitumumab on 07/29/2019.  Diarrhea is controlled with Lomotil.  No nausea or vomiting.  Drainage from the left abdomen fistula has decreased.  She had an episode of rectal bleeding last week.  This has resolved.  She reports a stable pustular skin rash over the trunk and face.  She has linear abrasions over the fingertips and the feet.  Objective:  Vital signs in last 24 hours:  Blood pressure 122/71, pulse 78, temperature 98.2 F (36.8 C), temperature source Temporal, resp. rate 18, height _0  (1.676 m), weight 146 lb 14.4 oz (66.6 kg), SpO2 100 %.    HEENT: No thrush or ulcers Resp: Lungs clear bilaterally Cardio: Regular rate and rhythm GI: No hepatomegaly, 2 cm opening at the left mid abdomen fistula with mild surrounding erythema Vascular: No leg edema  Skin: Acne type rash over the trunk and face with dryness of the face and back.  Multiple linear ulcers over the fingers and feet  Portacath/PICC-without erythema  Lab Results:  Lab Results  Component Value Date   WBC 7.0 08/13/2019   HGB 11.7 (L) 08/13/2019   HCT 37.9 08/13/2019   MCV 80.8 08/13/2019   PLT 123 (L) 08/13/2019   NEUTROABS 4.9 08/13/2019    CMP  Lab Results  Component Value Date   NA 145 08/13/2019   K 4.0 08/13/2019   CL 108 08/13/2019   CO2 26 08/13/2019   GLUCOSE 150 (H) 08/13/2019   BUN 6 08/13/2019   CREATININE 0.74 08/13/2019   CALCIUM 9.4 08/13/2019   PROT 6.7 08/13/2019   ALBUMIN 3.8 08/13/2019   AST 21 08/13/2019   ALT 20 08/13/2019   ALKPHOS 170 (H) 08/13/2019   BILITOT 0.7 08/13/2019   GFRNONAA >60 08/13/2019   GFRAA >60 08/13/2019    Lab Results  Component Value Date   CEA1 16.05 (H) 08/13/2019     Medications: I have reviewed the patient's current medications.   Assessment/Plan: 1. 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  The tumor returned microsatellite stable with no loss of mismatch repair protein expression   APC mutated. No BRAF, KRAS, or NRAS mutation On Foundation 1 testing   Cycle 1 adjuvant FOLFOX 09/08/2013   Cycle 2 adjuvant FOLFOX 09/24/2013   Cycle 3 adjuvant FOLFOX 10/08/2013.   Cycle 4 adjuvant FOLFOX 10/22/2013.   Cycle 5 adjuvant FOLFOX 11/05/2013. Oxaliplatin held due to thrombocytopenia.  Cycle 6 FOLFOX 11/19/2013.  Cycle 7 FOLFOX 12/03/2013. Oxaliplatin held secondary to thrombocytopenia.  Cycle 8 FOLFOX 12/17/2013.  Cycle 9 FOLFOX 01/04/2014. Oxaliplatin held secondary to neutropenia.   Cycle 10 FOLFOX 01/21/2014. Oxaliplatin held secondary to thrombocytopenia.  Cycle 11 FOLFOX 02/04/2014  Cycle 12 FOLFOX 02/18/2014, oxaliplatin dose reduced secondary tothrombocytopenia  CT abdomen/pelvis 01/30/2014 revealed splenomegaly and no evidence of recurrent colon cancer  CT chest 04/07/2014 with a stable right lower lobe nodule and no evidence for metastatic disease, no nodules seen on the CT 11/26/2014  Markedly elevated CEA 11/24/2014  CT 11/26/2014 revealed a right pelvic mass, splenomegaly, small volume ascites  Right salpingo-oophorectomy 12/28/2014 with the pathology confirming metastatic colon cancer  CTs 03/23/2016-no evidence of recurrent or metastatic disease.  CT 11/26/2016-enlargement of a fluid density structure the right pelvic sidewall, no other evidence of metastatic disease  CT aspiration  right pelvic cyst 12/19/2016.Cytology-BENIGN REACTIVE/REPARATIVE CHANGES.  CTs 06/05/2017-no evidence of metastatic disease, mild cirrhotic changes with splenomegaly  CTs 12/11/2017-recurrent cystic right adnexal mass, similar to on the CT 11/26/2016, stable mild splenomegaly  CTs 04/23/2018-enlargement of cystic right adnexal mass  with mural nodularity, no other evidence of metastatic disease  Cytoreductive surgery/HIPEC with mitomycin by Dr. Clovis Riley at [facility] 08/12/2018-R1 resection achieved. Cytoreduction included omentectomy, LAR, right salpingo-oophorectomy and left colonic gutter/pelvic stripping. Pathology on the rectum showed recurrent/metastatic adenocarcinoma, tumor 2.0 cm, predominantly involving the subserosa and muscularis propria of the colon, proximal and distal margins of resection were negative, vascular invasion present, metastatic carcinoma present in 1 out of 5 lymph nodes; omentum resection with no malignancy seen, no metastatic carcinoma identified in 1 lymph node examined; left gutter stripping positive metastatic adenocarcinoma; right ovary resection positive metastatic adenocarcinoma.  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  CEA 68 02/03/2019  Biopsy liver lesion 02/11/2019-metastatic adenocarcinoma consistent with primary colonic adenocarcinoma  CTs 02/27/2019-multiple liver lesions increased in size, new lesion in the lateral right lobe of the liver. No evidence of metastatic disease in the chest. Redemonstrated moderate left hydronephrosis and proximal hydroureter without discrete lesion or obstructing etiology at the transition point of the mid ureter.  Cycle 1 FOLFIRI/Panitumumab 03/12/2019  Cycle 2 FOLFIRI/Panitumumab 03/26/2019-bolus 5-FU and irinotecan held secondary to neutropenia  Cycle 3 FOLFIRI/Panitumumab 04/09/2019, Udenyca added-not given secondary to seizure/discontinuation of the 5-FU pump  CT abdomen/pelvis at [facility] 05/04/2019-no residual fluid collection at the left abdominal wall abscess, stable moderate left hydronephrosis, multifocal indeterminate liver lesions--liver lesionssignificantly improved  Cycle 4 FOLFIRI/Panitumumab 05/07/2019, Udenyca  Cycle 5 FOLFIRI/Panitumumab 05/20/2019, Udenyca  Cycle  6 FOLFIRI/Panitumumab 06/18/2019, Udenyca  Cycle 7 FOLFIRI/Panitumumab 07/01/2019 (Irinotecan dose reduced due to thrombocytopenia), Udenyca--Udenyca was not given  Cycle 8 FOLFIRI/Panitumumab 07/15/2019, Udenyca  Cycle 9 FOLFIRI/Panitumumab 07/29/2019, Udenyca  Cycle 10 FOLFIRI/Panitumumab 08/13/2019, Udenyca 2. Mild elevation of the CEA beginning January 2016, normal on 05/19/2014   3. History of iron deficiency anemia  4. seizure disorder; seizure 04/09/2019. Brain CT negative. Now on Keppra. 5. history of depression  6. 4 mm right lower lobe nodule on a staging chest CT 09/08/2013 , stable on a CT 04/07/2014 7. Hospitalization 09/24/2013 through 09/26/2013 with fever and abdominal pain.  8. 09/24/2013 urine culture positive for coag negative staph.  9. 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 Splenomegaly noted on a CT scan 01/30/2014,persistent on repeat CTs 12. Colonoscopy 11/17/2018-flexible sigmoidoscopy per rectum with changes of mild diversion colitis. Scope advanced for approximately 25 cm. Most proximal portion had necrotic appearing debris. Colostomy bag insufflated suggesting some type of communication between the pouch and the proximal colon. Introduction of scope into the ostomy found to available directions. 1 toward the distal pouch with similar appearing necrotic debris encountered. The other was about a 30 cm segment of normal-appearing colonic mucosa to the level of the previous right hemicolectomy ileocolonic anastomosis. 64. Port-A-Cath placement interventional radiology 03/05/2019 14. Possible abdominal wall abscess status post evaluation by surgery at [facility] 04/03/2019, antibiotics initiated; incision and drainage with purulent material removed 04/22/2019  CT at 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  She will complete another cycle of FOLFIRI/Panitumumab today.  She will undergo  restaging CTs at [facility] next week.  She has an appoint with Dr. Clovis Riley.  She will return for an office visit here with the plan to continue FOLFIRI/Panitumumab on 08/27/2019.  She will continue skin care for the hand/foot ulcers and skin rash.  Betsy Coder, MD  08/13/2019  1:54 PM

## 2019-08-15 ENCOUNTER — Other Ambulatory Visit: Payer: Self-pay

## 2019-08-15 ENCOUNTER — Inpatient Hospital Stay: Payer: BC Managed Care – PPO

## 2019-08-15 VITALS — BP 110/75 | HR 75 | Temp 98.8°F | Resp 20

## 2019-08-15 DIAGNOSIS — Z5112 Encounter for antineoplastic immunotherapy: Secondary | ICD-10-CM | POA: Diagnosis not present

## 2019-08-15 DIAGNOSIS — C787 Secondary malignant neoplasm of liver and intrahepatic bile duct: Secondary | ICD-10-CM | POA: Diagnosis not present

## 2019-08-15 DIAGNOSIS — C182 Malignant neoplasm of ascending colon: Secondary | ICD-10-CM | POA: Diagnosis not present

## 2019-08-15 DIAGNOSIS — Z452 Encounter for adjustment and management of vascular access device: Secondary | ICD-10-CM | POA: Diagnosis not present

## 2019-08-15 DIAGNOSIS — R197 Diarrhea, unspecified: Secondary | ICD-10-CM | POA: Diagnosis not present

## 2019-08-15 DIAGNOSIS — D509 Iron deficiency anemia, unspecified: Secondary | ICD-10-CM | POA: Diagnosis not present

## 2019-08-15 DIAGNOSIS — Z5111 Encounter for antineoplastic chemotherapy: Secondary | ICD-10-CM | POA: Diagnosis not present

## 2019-08-15 DIAGNOSIS — D696 Thrombocytopenia, unspecified: Secondary | ICD-10-CM | POA: Diagnosis not present

## 2019-08-15 DIAGNOSIS — Z5189 Encounter for other specified aftercare: Secondary | ICD-10-CM | POA: Diagnosis not present

## 2019-08-15 DIAGNOSIS — R21 Rash and other nonspecific skin eruption: Secondary | ICD-10-CM | POA: Diagnosis not present

## 2019-08-15 DIAGNOSIS — K632 Fistula of intestine: Secondary | ICD-10-CM | POA: Diagnosis not present

## 2019-08-15 MED ORDER — SODIUM CHLORIDE 0.9% FLUSH
10.0000 mL | INTRAVENOUS | Status: DC | PRN
  Administered 2019-08-15: 10 mL
  Filled 2019-08-15: qty 10

## 2019-08-15 MED ORDER — PEGFILGRASTIM-CBQV 6 MG/0.6ML ~~LOC~~ SOSY
6.0000 mg | PREFILLED_SYRINGE | Freq: Once | SUBCUTANEOUS | Status: AC
  Administered 2019-08-15: 6 mg via SUBCUTANEOUS

## 2019-08-15 MED ORDER — HEPARIN SOD (PORK) LOCK FLUSH 100 UNIT/ML IV SOLN
500.0000 [IU] | Freq: Once | INTRAVENOUS | Status: AC | PRN
  Administered 2019-08-15: 500 [IU]
  Filled 2019-08-15: qty 5

## 2019-08-15 NOTE — Patient Instructions (Signed)
Pegfilgrastim injection What is this medicine? PEGFILGRASTIM (PEG fil gra stim) is a long-acting granulocyte colony-stimulating factor that stimulates the growth of neutrophils, a type of white blood cell important in the body's fight against infection. It is used to reduce the incidence of fever and infection in patients with certain types of cancer who are receiving chemotherapy that affects the bone marrow, and to increase survival after being exposed to high doses of radiation. This medicine may be used for other purposes; ask your health care provider or pharmacist if you have questions. COMMON BRAND NAME(S): Fulphila, Neulasta, UDENYCA, Ziextenzo What should I tell my health care provider before I take this medicine? They need to know if you have any of these conditions:  kidney disease  latex allergy  ongoing radiation therapy  sickle cell disease  skin reactions to acrylic adhesives (On-Body Injector only)  an unusual or allergic reaction to pegfilgrastim, filgrastim, other medicines, foods, dyes, or preservatives  pregnant or trying to get pregnant  breast-feeding How should I use this medicine? This medicine is for injection under the skin. If you get this medicine at home, you will be taught how to prepare and give the pre-filled syringe or how to use the On-body Injector. Refer to the patient Instructions for Use for detailed instructions. Use exactly as directed. Tell your healthcare provider immediately if you suspect that the On-body Injector may not have performed as intended or if you suspect the use of the On-body Injector resulted in a missed or partial dose. It is important that you put your used needles and syringes in a special sharps container. Do not put them in a trash can. If you do not have a sharps container, call your pharmacist or healthcare provider to get one. Talk to your pediatrician regarding the use of this medicine in children. While this drug may be  prescribed for selected conditions, precautions do apply. Overdosage: If you think you have taken too much of this medicine contact a poison control center or emergency room at once. NOTE: This medicine is only for you. Do not share this medicine with others. What if I miss a dose? It is important not to miss your dose. Call your doctor or health care professional if you miss your dose. If you miss a dose due to an On-body Injector failure or leakage, a new dose should be administered as soon as possible using a single prefilled syringe for manual use. What may interact with this medicine? Interactions have not been studied. Give your health care provider a list of all the medicines, herbs, non-prescription drugs, or dietary supplements you use. Also tell them if you smoke, drink alcohol, or use illegal drugs. Some items may interact with your medicine. This list may not describe all possible interactions. Give your health care provider a list of all the medicines, herbs, non-prescription drugs, or dietary supplements you use. Also tell them if you smoke, drink alcohol, or use illegal drugs. Some items may interact with your medicine. What should I watch for while using this medicine? You may need blood work done while you are taking this medicine. If you are going to need a MRI, CT scan, or other procedure, tell your doctor that you are using this medicine (On-Body Injector only). What side effects may I notice from receiving this medicine? Side effects that you should report to your doctor or health care professional as soon as possible:  allergic reactions like skin rash, itching or hives, swelling of the   face, lips, or tongue  back pain  dizziness  fever  pain, redness, or irritation at site where injected  pinpoint red spots on the skin  red or dark-Zehring urine  shortness of breath or breathing problems  stomach or side pain, or pain at the  shoulder  swelling  tiredness  trouble passing urine or change in the amount of urine Side effects that usually do not require medical attention (report to your doctor or health care professional if they continue or are bothersome):  bone pain  muscle pain This list may not describe all possible side effects. Call your doctor for medical advice about side effects. You may report side effects to FDA at 1-800-FDA-1088. Where should I keep my medicine? Keep out of the reach of children. If you are using this medicine at home, you will be instructed on how to store it. Throw away any unused medicine after the expiration date on the label. NOTE: This sheet is a summary. It may not cover all possible information. If you have questions about this medicine, talk to your doctor, pharmacist, or health care provider.  2020 Elsevier/Gold Standard (2017-04-29 16:57:08) Implanted Port Home Guide An implanted port is a device that is placed under the skin. It is usually placed in the chest. The device can be used to give IV medicine, to take blood, or for dialysis. You may have an implanted port if:  You need IV medicine that would be irritating to the small veins in your hands or arms.  You need IV medicines, such as antibiotics, for a long period of time.  You need IV nutrition for a long period of time.  You need dialysis. Having a port means that your health care provider will not need to use the veins in your arms for these procedures. You may have fewer limitations when using a port than you would if you used other types of long-term IVs, and you will likely be able to return to normal activities after your incision heals. An implanted port has two main parts:  Reservoir. The reservoir is the part where a needle is inserted to give medicines or draw blood. The reservoir is round. After it is placed, it appears as a small, raised area under your skin.  Catheter. The catheter is a thin,  flexible tube that connects the reservoir to a vein. Medicine that is inserted into the reservoir goes into the catheter and then into the vein. How is my port accessed? To access your port:  A numbing cream may be placed on the skin over the port site.  Your health care provider will put on a mask and sterile gloves.  The skin over your port will be cleaned carefully with a germ-killing soap and allowed to dry.  Your health care provider will gently pinch the port and insert a needle into it.  Your health care provider will check for a blood return to make sure the port is in the vein and is not clogged.  If your port needs to remain accessed to get medicine continuously (constant infusion), your health care provider will place a clear bandage (dressing) over the needle site. The dressing and needle will need to be changed every week, or as told by your health care provider. What is flushing? Flushing helps keep the port from getting clogged. Follow instructions from your health care provider about how and when to flush the port. Ports are usually flushed with saline solution or a medicine   called heparin. The need for flushing will depend on how the port is used:  If the port is only used from time to time to give medicines or draw blood, the port may need to be flushed: ? Before and after medicines have been given. ? Before and after blood has been drawn. ? As part of routine maintenance. Flushing may be recommended every 4-6 weeks.  If a constant infusion is running, the port may not need to be flushed.  Throw away any syringes in a disposal container that is meant for sharp items (sharps container). You can buy a sharps container from a pharmacy, or you can make one by using an empty hard plastic bottle with a cover. How long will my port stay implanted? The port can stay in for as long as your health care provider thinks it is needed. When it is time for the port to come out, a  surgery will be done to remove it. The surgery will be similar to the procedure that was done to put the port in. Follow these instructions at home:   Flush your port as told by your health care provider.  If you need an infusion over several days, follow instructions from your health care provider about how to take care of your port site. Make sure you: ? Wash your hands with soap and water before you change your dressing. If soap and water are not available, use alcohol-based hand sanitizer. ? Change your dressing as told by your health care provider. ? Place any used dressings or infusion bags into a plastic bag. Throw that bag in the trash. ? Keep the dressing that covers the needle clean and dry. Do not get it wet. ? Do not use scissors or sharp objects near the tube. ? Keep the tube clamped, unless it is being used.  Check your port site every day for signs of infection. Check for: ? Redness, swelling, or pain. ? Fluid or blood. ? Pus or a bad smell.  Protect the skin around the port site. ? Avoid wearing bra straps that rub or irritate the site. ? Protect the skin around your port from seat belts. Place a soft pad over your chest if needed.  Bathe or shower as told by your health care provider. The site may get wet as long as you are not actively receiving an infusion.  Return to your normal activities as told by your health care provider. Ask your health care provider what activities are safe for you.  Carry a medical alert card or wear a medical alert bracelet at all times. This will let health care providers know that you have an implanted port in case of an emergency. Get help right away if:  You have redness, swelling, or pain at the port site.  You have fluid or blood coming from your port site.  You have pus or a bad smell coming from the port site.  You have a fever. Summary  Implanted ports are usually placed in the chest for long-term IV access.  Follow  instructions from your health care provider about flushing the port and changing bandages (dressings).  Take care of the area around your port by avoiding clothing that puts pressure on the area, and by watching for signs of infection.  Protect the skin around your port from seat belts. Place a soft pad over your chest if needed.  Get help right away if you have a fever or you have redness,   swelling, pain, drainage, or a bad smell at the port site. This information is not intended to replace advice given to you by your health care provider. Make sure you discuss any questions you have with your health care provider. Document Revised: 05/16/2018 Document Reviewed: 02/25/2016 Elsevier Patient Education  2020 Elsevier Inc.  

## 2019-08-17 ENCOUNTER — Telehealth: Payer: Self-pay | Admitting: Nurse Practitioner

## 2019-08-17 NOTE — Telephone Encounter (Signed)
No changes made to pt's scheduled per 7/8 los.

## 2019-08-19 ENCOUNTER — Encounter: Payer: Self-pay | Admitting: Oncology

## 2019-08-19 DIAGNOSIS — Z9889 Other specified postprocedural states: Secondary | ICD-10-CM | POA: Diagnosis not present

## 2019-08-19 DIAGNOSIS — K632 Fistula of intestine: Secondary | ICD-10-CM | POA: Diagnosis not present

## 2019-08-19 DIAGNOSIS — Z933 Colostomy status: Secondary | ICD-10-CM | POA: Diagnosis not present

## 2019-08-19 DIAGNOSIS — Z9049 Acquired absence of other specified parts of digestive tract: Secondary | ICD-10-CM | POA: Diagnosis not present

## 2019-08-19 DIAGNOSIS — Z881 Allergy status to other antibiotic agents status: Secondary | ICD-10-CM | POA: Diagnosis not present

## 2019-08-19 DIAGNOSIS — C182 Malignant neoplasm of ascending colon: Secondary | ICD-10-CM | POA: Diagnosis not present

## 2019-08-19 DIAGNOSIS — Z882 Allergy status to sulfonamides status: Secondary | ICD-10-CM | POA: Diagnosis not present

## 2019-08-19 DIAGNOSIS — Z880 Allergy status to penicillin: Secondary | ICD-10-CM | POA: Diagnosis not present

## 2019-08-19 DIAGNOSIS — N1330 Unspecified hydronephrosis: Secondary | ICD-10-CM | POA: Diagnosis not present

## 2019-08-19 DIAGNOSIS — Z885 Allergy status to narcotic agent status: Secondary | ICD-10-CM | POA: Diagnosis not present

## 2019-08-19 DIAGNOSIS — Z888 Allergy status to other drugs, medicaments and biological substances status: Secondary | ICD-10-CM | POA: Diagnosis not present

## 2019-08-19 DIAGNOSIS — C189 Malignant neoplasm of colon, unspecified: Secondary | ICD-10-CM | POA: Diagnosis not present

## 2019-08-20 ENCOUNTER — Encounter: Payer: Self-pay | Admitting: Oncology

## 2019-08-20 ENCOUNTER — Other Ambulatory Visit: Payer: Self-pay | Admitting: Oncology

## 2019-08-20 ENCOUNTER — Other Ambulatory Visit: Payer: Self-pay

## 2019-08-20 DIAGNOSIS — C182 Malignant neoplasm of ascending colon: Secondary | ICD-10-CM

## 2019-08-20 DIAGNOSIS — C787 Secondary malignant neoplasm of liver and intrahepatic bile duct: Secondary | ICD-10-CM

## 2019-08-20 MED ORDER — MAGNESIUM OXIDE 400 (241.3 MG) MG PO TABS: 400.0000 mg | ORAL_TABLET | Freq: Two times a day (BID) | ORAL | 2 refills | Status: DC

## 2019-08-20 MED ORDER — DIPHENOXYLATE-ATROPINE 2.5-0.025 MG PO TABS: ORAL_TABLET | ORAL | 0 refills | Status: DC

## 2019-08-23 ENCOUNTER — Other Ambulatory Visit: Payer: Self-pay | Admitting: Oncology

## 2019-08-25 ENCOUNTER — Encounter: Payer: Self-pay | Admitting: Oncology

## 2019-08-26 ENCOUNTER — Telehealth: Payer: Self-pay

## 2019-08-26 DIAGNOSIS — Z7189 Other specified counseling: Secondary | ICD-10-CM | POA: Diagnosis not present

## 2019-08-26 DIAGNOSIS — C182 Malignant neoplasm of ascending colon: Secondary | ICD-10-CM | POA: Diagnosis not present

## 2019-08-26 DIAGNOSIS — L988 Other specified disorders of the skin and subcutaneous tissue: Secondary | ICD-10-CM | POA: Diagnosis not present

## 2019-08-26 NOTE — Telephone Encounter (Signed)
Called spoke to patient, patient states she was calling to just make aware and has made appt with [facility] for today and will call [facility] back with outcome    Madaline Brilliant Onc Nurse Cc Hey my incision is really red and hurting is there anything that yall can think of that I could put on it to make it stop I have used neosporin, baby rash a&d the nystatin powder

## 2019-08-27 ENCOUNTER — Inpatient Hospital Stay: Payer: BC Managed Care – PPO

## 2019-08-27 ENCOUNTER — Encounter: Payer: Self-pay | Admitting: Nurse Practitioner

## 2019-08-27 ENCOUNTER — Other Ambulatory Visit: Payer: Self-pay

## 2019-08-27 ENCOUNTER — Inpatient Hospital Stay (HOSPITAL_BASED_OUTPATIENT_CLINIC_OR_DEPARTMENT_OTHER): Payer: BC Managed Care – PPO | Admitting: Nurse Practitioner

## 2019-08-27 VITALS — BP 119/78 | HR 76 | Temp 97.7°F | Resp 18 | Ht 66.0 in | Wt 149.1 lb

## 2019-08-27 DIAGNOSIS — Z95828 Presence of other vascular implants and grafts: Secondary | ICD-10-CM

## 2019-08-27 DIAGNOSIS — Z5111 Encounter for antineoplastic chemotherapy: Secondary | ICD-10-CM | POA: Diagnosis not present

## 2019-08-27 DIAGNOSIS — Z452 Encounter for adjustment and management of vascular access device: Secondary | ICD-10-CM | POA: Diagnosis not present

## 2019-08-27 DIAGNOSIS — Z5112 Encounter for antineoplastic immunotherapy: Secondary | ICD-10-CM | POA: Diagnosis not present

## 2019-08-27 DIAGNOSIS — C787 Secondary malignant neoplasm of liver and intrahepatic bile duct: Secondary | ICD-10-CM | POA: Diagnosis not present

## 2019-08-27 DIAGNOSIS — R197 Diarrhea, unspecified: Secondary | ICD-10-CM | POA: Diagnosis not present

## 2019-08-27 DIAGNOSIS — C182 Malignant neoplasm of ascending colon: Secondary | ICD-10-CM

## 2019-08-27 DIAGNOSIS — Z5189 Encounter for other specified aftercare: Secondary | ICD-10-CM | POA: Diagnosis not present

## 2019-08-27 DIAGNOSIS — K632 Fistula of intestine: Secondary | ICD-10-CM | POA: Diagnosis not present

## 2019-08-27 DIAGNOSIS — R21 Rash and other nonspecific skin eruption: Secondary | ICD-10-CM | POA: Diagnosis not present

## 2019-08-27 DIAGNOSIS — D509 Iron deficiency anemia, unspecified: Secondary | ICD-10-CM | POA: Diagnosis not present

## 2019-08-27 DIAGNOSIS — D696 Thrombocytopenia, unspecified: Secondary | ICD-10-CM | POA: Diagnosis not present

## 2019-08-27 LAB — CBC WITH DIFFERENTIAL (CANCER CENTER ONLY)
Abs Immature Granulocytes: 0.45 10*3/uL — ABNORMAL HIGH (ref 0.00–0.07)
Basophils Absolute: 0.1 10*3/uL (ref 0.0–0.1)
Basophils Relative: 1 %
Eosinophils Absolute: 0.2 10*3/uL (ref 0.0–0.5)
Eosinophils Relative: 2 %
HCT: 36.7 % (ref 36.0–46.0)
Hemoglobin: 11.1 g/dL — ABNORMAL LOW (ref 12.0–15.0)
Immature Granulocytes: 6 %
Lymphocytes Relative: 13 %
Lymphs Abs: 0.9 10*3/uL (ref 0.7–4.0)
MCH: 24.4 pg — ABNORMAL LOW (ref 26.0–34.0)
MCHC: 30.2 g/dL (ref 30.0–36.0)
MCV: 80.8 fL (ref 80.0–100.0)
Monocytes Absolute: 0.3 10*3/uL (ref 0.1–1.0)
Monocytes Relative: 4 %
Neutro Abs: 5.2 10*3/uL (ref 1.7–7.7)
Neutrophils Relative %: 74 %
Platelet Count: 125 10*3/uL — ABNORMAL LOW (ref 150–400)
RBC: 4.54 MIL/uL (ref 3.87–5.11)
RDW: 17.2 % — ABNORMAL HIGH (ref 11.5–15.5)
WBC Count: 7.2 10*3/uL (ref 4.0–10.5)
nRBC: 0 % (ref 0.0–0.2)

## 2019-08-27 LAB — CMP (CANCER CENTER ONLY)
ALT: 11 U/L (ref 0–44)
AST: 15 U/L (ref 15–41)
Albumin: 3.3 g/dL — ABNORMAL LOW (ref 3.5–5.0)
Alkaline Phosphatase: 159 U/L — ABNORMAL HIGH (ref 38–126)
Anion gap: 10 (ref 5–15)
BUN: 12 mg/dL (ref 6–20)
CO2: 22 mmol/L (ref 22–32)
Calcium: 8.9 mg/dL (ref 8.9–10.3)
Chloride: 112 mmol/L — ABNORMAL HIGH (ref 98–111)
Creatinine: 0.72 mg/dL (ref 0.44–1.00)
GFR, Est AFR Am: >60 mL/min (ref >60)
GFR, Estimated: >60 mL/min (ref >60)
Glucose, Bld: 161 mg/dL — ABNORMAL HIGH (ref 70–99)
Potassium: 4.2 mmol/L (ref 3.5–5.1)
Sodium: 144 mmol/L (ref 135–145)
Total Bilirubin: 0.5 mg/dL (ref 0.3–1.2)
Total Protein: 6 g/dL — ABNORMAL LOW (ref 6.5–8.1)

## 2019-08-27 LAB — MAGNESIUM: Magnesium: 1.1 mg/dL — CL (ref 1.7–2.4)

## 2019-08-27 LAB — CEA (IN HOUSE-CHCC): CEA (CHCC-In House): 10.22 ng/mL — ABNORMAL HIGH (ref 0.00–5.00)

## 2019-08-27 MED ORDER — SODIUM CHLORIDE 0.9 % IV SOLN
6.0000 mg/kg | Freq: Once | INTRAVENOUS | Status: AC
  Administered 2019-08-27: 400 mg via INTRAVENOUS
  Filled 2019-08-27: qty 20

## 2019-08-27 MED ORDER — SODIUM CHLORIDE 0.9% FLUSH
10.0000 mL | INTRAVENOUS | Status: DC | PRN
  Administered 2019-08-27: 10 mL
  Filled 2019-08-27: qty 10

## 2019-08-27 MED ORDER — PALONOSETRON HCL INJECTION 0.25 MG/5ML
INTRAVENOUS | Status: AC
  Filled 2019-08-27: qty 5

## 2019-08-27 MED ORDER — SODIUM CHLORIDE 0.9 % IV SOLN
140.0000 mg/m2 | Freq: Once | INTRAVENOUS | Status: AC
  Administered 2019-08-27: 260 mg via INTRAVENOUS
  Filled 2019-08-27: qty 13

## 2019-08-27 MED ORDER — SODIUM CHLORIDE 0.9 % IV SOLN
10.0000 mg | Freq: Once | INTRAVENOUS | Status: AC
  Administered 2019-08-27: 10 mg via INTRAVENOUS
  Filled 2019-08-27: qty 10

## 2019-08-27 MED ORDER — HEPARIN SOD (PORK) LOCK FLUSH 100 UNIT/ML IV SOLN
500.0000 [IU] | Freq: Once | INTRAVENOUS | Status: AC | PRN
  Administered 2019-08-27: 500 [IU]
  Filled 2019-08-27: qty 5

## 2019-08-27 MED ORDER — SODIUM CHLORIDE 0.9% FLUSH
10.0000 mL | Freq: Once | INTRAVENOUS | Status: AC
  Administered 2019-08-27: 10 mL
  Filled 2019-08-27: qty 10

## 2019-08-27 MED ORDER — SODIUM CHLORIDE 0.9 % IV SOLN
Freq: Once | INTRAVENOUS | Status: AC
  Filled 2019-08-27: qty 250

## 2019-08-27 MED ORDER — ATROPINE SULFATE 1 MG/ML IJ SOLN
INTRAMUSCULAR | Status: AC
  Filled 2019-08-27: qty 1

## 2019-08-27 MED ORDER — MAGNESIUM SULFATE 4 GM/100ML IV SOLN
4.0000 g | Freq: Once | INTRAVENOUS | Status: AC
  Administered 2019-08-27: 4 g via INTRAVENOUS
  Filled 2019-08-27: qty 100

## 2019-08-27 MED ORDER — ATROPINE SULFATE 1 MG/ML IJ SOLN
0.5000 mg | Freq: Once | INTRAMUSCULAR | Status: AC | PRN
  Administered 2019-08-27: 0.5 mg via INTRAVENOUS

## 2019-08-27 MED ORDER — PALONOSETRON HCL INJECTION 0.25 MG/5ML
0.2500 mg | Freq: Once | INTRAVENOUS | Status: AC
  Administered 2019-08-27: 0.25 mg via INTRAVENOUS

## 2019-08-27 NOTE — Progress Notes (Addendum)
[name] OFFICE PROGRESS NOTE   Diagnosis: Colon cancer  INTERVAL HISTORY:   [name] returns as scheduled.  She completed cycle 10 FOLFIRI/Panitumumab 08/13/2019.  She noted increased drainage from the fistula about a week ago.  She subsequently noticed increased redness/discomfort involving the surrounding skin.  A bag was placed over the fistula yesterday.  Skin is now less irritated.  Drainage is less.  She denies fever.  No nausea or vomiting.  Diarrhea controlled with Lomotil.  Stable skin rash and skin changes at the hands and feet.  Objective:  Vital signs in last 24 hours:  Blood pressure 119/78, pulse 76, temperature 97.7 F (36.5 C), temperature source Temporal, resp. rate 18, height _0  (1.676 m), weight 149 lb 1.6 oz (67.6 kg), SpO2 100 %.    HEENT: No thrush or ulcers. Resp: Lungs clear bilaterally. Cardio: Regular rate and rhythm. GI: No hepatomegaly.  Ostomy collection bag over the fistula.  Exposed skin appears mildly erythematous. Vascular: No leg edema.  Skin: Skin in general has a dry appearance.  Faint acne type rash over the face and trunk.  Multiple linear breaks in the skin over the fingers and feet. Port-A-Cath without erythema.   Lab Results:  Lab Results  Component Value Date   WBC 7.2 08/27/2019   HGB 11.1 (L) 08/27/2019   HCT 36.7 08/27/2019   MCV 80.8 08/27/2019   PLT 125 (L) 08/27/2019   NEUTROABS PENDING 08/27/2019    Imaging:  No results found.  Medications: I have reviewed the patient's current medications.  Assessment/Plan: 1. 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  The tumor returned microsatellite stable with no loss of mismatch repair protein expression   APC mutated. No BRAF, KRAS, or NRAS mutation On Foundation 1 testing   Cycle 1 adjuvant FOLFOX 09/08/2013   Cycle 2 adjuvant FOLFOX 09/24/2013    Cycle 3 adjuvant FOLFOX 10/08/2013.   Cycle 4 adjuvant FOLFOX 10/22/2013.   Cycle 5 adjuvant FOLFOX 11/05/2013. Oxaliplatin held due to thrombocytopenia.  Cycle 6 FOLFOX 11/19/2013.  Cycle 7 FOLFOX 12/03/2013. Oxaliplatin held secondary to thrombocytopenia.  Cycle 8 FOLFOX 12/17/2013.  Cycle 9 FOLFOX 01/04/2014. Oxaliplatin held secondary to neutropenia.   Cycle 10 FOLFOX 01/21/2014. Oxaliplatin held secondary to thrombocytopenia.  Cycle 11 FOLFOX 02/04/2014  Cycle 12 FOLFOX 02/18/2014, oxaliplatin dose reduced secondary tothrombocytopenia  CT abdomen/pelvis 01/30/2014 revealed splenomegaly and no evidence of recurrent colon cancer  CT chest 04/07/2014 with a stable right lower lobe nodule and no evidence for metastatic disease, no nodules seen on the CT 11/26/2014  Markedly elevated CEA 11/24/2014  CT 11/26/2014 revealed a right pelvic mass, splenomegaly, small volume ascites  Right salpingo-oophorectomy 12/28/2014 with the pathology confirming metastatic colon cancer  CTs 03/23/2016-no evidence of recurrent or metastatic disease.  CT 11/26/2016-enlargement of a fluid density structure the right pelvic sidewall, no other evidence of metastatic disease  CT aspiration right pelvic cyst 12/19/2016.Cytology-BENIGN REACTIVE/REPARATIVE CHANGES.  CTs 06/05/2017-no evidence of metastatic disease, mild cirrhotic changes with splenomegaly  CTs 12/11/2017-recurrent cystic right adnexal mass, similar to on the CT 11/26/2016, stable mild splenomegaly  CTs 04/23/2018-enlargement of cystic right adnexal mass with mural nodularity, no other evidence of metastatic disease  Cytoreductive surgery/HIPEC with mitomycin by Dr. Clovis Riley at [facility] 08/12/2018-R1 resection achieved. Cytoreduction included omentectomy, LAR, right salpingo-oophorectomy and left colonic gutter/pelvic stripping. Pathology on the rectum showed recurrent/metastatic adenocarcinoma, tumor 2.0 cm, predominantly  involving the subserosa and  muscularis propria of the colon, proximal and distal margins of resection were negative, vascular invasion present, metastatic carcinoma present in 1 out of 5 lymph nodes; omentum resection with no malignancy seen, no metastatic carcinoma identified in 1 lymph node examined; left gutter stripping positive metastatic adenocarcinoma; right ovary resection positive metastatic adenocarcinoma.  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  CEA 68 02/03/2019  Biopsy liver lesion 02/11/2019-metastatic adenocarcinoma consistent with primary colonic adenocarcinoma  CTs 02/27/2019-multiple liver lesions increased in size, new lesion in the lateral right lobe of the liver. No evidence of metastatic disease in the chest. Redemonstrated moderate left hydronephrosis and proximal hydroureter without discrete lesion or obstructing etiology at the transition point of the mid ureter.  Cycle 1 FOLFIRI/Panitumumab 03/12/2019  Cycle 2 FOLFIRI/Panitumumab 03/26/2019-bolus 5-FU and irinotecan held secondary to neutropenia  Cycle 3 FOLFIRI/Panitumumab 04/09/2019, Udenyca added-not given secondary to seizure/discontinuation of the 5-FU pump  CT abdomen/pelvis at [facility] 05/04/2019-no residual fluid collection at the left abdominal wall abscess, stable moderate left hydronephrosis, multifocal indeterminate liver lesions--liver lesionssignificantly improved  Cycle 4 FOLFIRI/Panitumumab 05/07/2019, Udenyca  Cycle 5 FOLFIRI/Panitumumab 05/20/2019, Udenyca  Cycle 6 FOLFIRI/Panitumumab 06/18/2019, Udenyca  Cycle 7 FOLFIRI/Panitumumab 07/01/2019 (Irinotecan dose reduced due to thrombocytopenia), Udenyca--Udenyca was not given  Cycle 8 FOLFIRI/Panitumumab 07/15/2019, Udenyca  Cycle 9 FOLFIRI/Panitumumab 07/29/2019, Udenyca  Cycle 10 FOLFIRI/Panitumumab 08/13/2019, Udenyca  CTs at [facility] 08/19/2019-decreased size of the  hypodense and hyperdense lesions within segment 2 of the left hepatic lobe.  No new lesions identified.  Similar presacral soft tissue thickening with adjacent stable alignment in the rectum.  Improved but persistent left UPJ obstruction with mild left hydronephrosis.  Persistent but decreased size of the wound within the left mid abdomen abdominal wall.  Cycle 11 irinotecan/Panitumumab 08/27/2019, Udenyca 2. Mild elevation of the CEA beginning January 2016, normal on 05/19/2014   3. History of iron deficiency anemia  4. seizure disorder; seizure 04/09/2019. Brain CT negative. Now on Keppra. 5. history of depression  6. 4 mm right lower lobe nodule on a staging chest CT 09/08/2013 , stable on a CT 04/07/2014 7. Hospitalization 09/24/2013 through 09/26/2013 with fever and abdominal pain.  8. 09/24/2013 urine culture positive for coag negative staph.  9. 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 Splenomegaly noted on a CT scan 01/30/2014,persistent on repeat CTs 12. Colonoscopy 11/17/2018-flexible sigmoidoscopy per rectum with changes of mild diversion colitis. Scope advanced for approximately 25 cm. Most proximal portion had necrotic appearing debris. Colostomy bag insufflated suggesting some type of communication between the pouch and the proximal colon. Introduction of scope into the ostomy found to available directions. 1 toward the distal pouch with similar appearing necrotic debris encountered. The other was about a 30 cm segment of normal-appearing colonic mucosa to the level of the previous right hemicolectomy ileocolonic anastomosis. 61. Port-A-Cath placement interventional radiology 03/05/2019 14. 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 Mehlman appears unchanged.  She has completed 10 cycles of FOLFIRI/Panitumumab.  Recent restaging CTs at [facility] on 08/19/2019 with stable to smaller liver lesions, no new liver lesions.  Persistent fistula.  We reviewed these  results with her at today's visit.  Dr. Benay Spice discussed different options including a treatment break, changing treatment to every 3 weeks, treatment with Panitumumab alone, continue treatment with irinotecan plus panitumumab on a 2-week schedule.  She would like to adjust her treatment to irinotecan plus Panitumumab continuing on a 2-week schedule.  She will continue white cell growth factor support.  We reviewed the CBC and chemistry panel from today.  Labs are adequate to proceed with treatment.  She has persistent hypomagnesemia.  She will continue oral magnesium.  Plan for IV magnesium today.  She will return for lab, follow-up, irinotecan plus panitumumab in 2 weeks.  She will contact the office in the interim with any problems.  We have contacted the radiology department at [facility] requesting scans done at [facility] be pushed through to our system.  Patient seen with Dr. Benay Spice.    Ned Card ANP/GNP-BC   08/27/2019  10:17 AM We reviewed the CT findings and treatment options with Ms. Owens Shark.  The metastatic colon cancer has responded to the FOLFIRI/Panitumumab.  She has a persistent left abdomen enterocutaneous fistula.  The chemotherapy may be contributing to healing of the fistula.  We discussed a treatment break, maintenance Panitumumab, continuing irinotecan/Panitumumab, and continuing the FOLFIRI/Panitumumab every 3 weeks.  We decided to continue treatment with every 2-week irinotecan/Panitumumab.  On We will review the [facility] CT images when they are available.  Julieanne Manson, MD

## 2019-08-27 NOTE — Progress Notes (Signed)
Spoke w/ [name], patient to receive magnesium 4 grams IV today for low magnesium level. Will confirm patient has also started oral magnesium replacement. Treatment has been adjusted to irinotecan and panitumumab starting today. GCSF will remain in patient's plan for D3 administration.   Demetrius Charity, PharmD, BCPS, [facility] Pharmacist Pharmacy Phone: [phone] 08/27/2019

## 2019-08-27 NOTE — Patient Instructions (Signed)
[facility] Discharge Instructions for Patients Receiving Chemotherapy  Today you received the following chemotherapy agents Vectibix, Irinotecan  To help prevent nausea and vomiting after your treatment, we encourage you to take your nausea medication as directed.    If you develop nausea and vomiting that is not controlled by your nausea medication, call the clinic.   BELOW ARE SYMPTOMS THAT SHOULD BE REPORTED IMMEDIATELY:  *FEVER GREATER THAN 100.5 F  *CHILLS WITH OR WITHOUT FEVER  NAUSEA AND VOMITING THAT IS NOT CONTROLLED WITH YOUR NAUSEA MEDICATION  *UNUSUAL SHORTNESS OF BREATH  *UNUSUAL BRUISING OR BLEEDING  TENDERNESS IN MOUTH AND THROAT WITH OR WITHOUT PRESENCE OF ULCERS  *URINARY PROBLEMS  *BOWEL PROBLEMS  UNUSUAL RASH Items with * indicate a potential emergency and should be followed up as soon as possible.  Feel free to call the clinic should you have any questions or concerns. The clinic phone number is [phone].  Please show the [facility] at check-in to the Emergency Department and triage nurse.   Hypomagnesemia Hypomagnesemia is a condition in which the level of magnesium in the blood is low. Magnesium is a mineral that is found in many foods. It is used in many different processes in the body. Hypomagnesemia can affect every organ in the body. In severe cases, it can cause life-threatening problems. What are the causes? This condition may be caused by:  Not getting enough magnesium in your diet.  Malnutrition.  Problems with absorbing magnesium from the intestines.  Dehydration.  Alcohol abuse.  Vomiting.  Severe or chronic diarrhea.  Some medicines, including medicines that make you urinate more (diuretics).  Certain diseases, such as kidney disease, diabetes, celiac disease, and overactive thyroid. What are the signs or symptoms? Symptoms of this condition include:  Loss of appetite.  Nausea and  vomiting.  Involuntary shaking or trembling of a body part (tremor).  Muscle weakness.  Tingling in the arms and legs.  Sudden tightening of muscles (muscle spasms).  Confusion.  Psychiatric issues, such as depression, irritability, or psychosis.  A feeling of fluttering of the heart.  Seizures. These symptoms are more severe if magnesium levels drop suddenly. How is this diagnosed? This condition may be diagnosed based on:  Your symptoms and medical history.  A physical exam.  Blood and urine tests. How is this treated? Treatment depends on the cause and the severity of the condition. It may be treated with:  A magnesium supplement. This can be taken in pill form. If the condition is severe, magnesium is usually given through an IV.  Changes to your diet. You may be directed to eat foods that have a lot of magnesium, such as green leafy vegetables, peas, beans, and nuts.  Stopping any intake of alcohol. Follow these instructions at home:      Make sure that your diet includes foods with magnesium. Foods that have a lot of magnesium in them include: ? Green leafy vegetables, such as spinach and broccoli. ? Beans and peas. ? Nuts and seeds, such as almonds and sunflower seeds. ? Whole grains, such as whole grain bread and fortified cereals.  Take magnesium supplements if your health care provider tells you to do that. Take them as directed.  Take over-the-counter and prescription medicines only as told by your health care provider.  Have your magnesium levels monitored as told by your health care provider.  When you are active, drink fluids that contain electrolytes.  Avoid drinking alcohol.  Keep  all follow-up visits as told by your health care provider. This is important. Contact a health care provider if:  You get worse instead of better.  Your symptoms return. Get help right away if you:  Develop severe muscle weakness.  Have trouble  breathing.  Feel that your heart is racing. Summary  Hypomagnesemia is a condition in which the level of magnesium in the blood is low.  Hypomagnesemia can affect every organ in the body.  Treatment may include eating more foods that contain magnesium, taking magnesium supplements, and not drinking alcohol.  Have your magnesium levels monitored as told by your health care provider. This information is not intended to replace advice given to you by your health care provider. Make sure you discuss any questions you have with your health care provider. Document Revised: 01/04/2017 Document Reviewed: 12/24/2016 Elsevier Patient Education  2020 [employer].

## 2019-08-29 ENCOUNTER — Inpatient Hospital Stay: Payer: BC Managed Care – PPO

## 2019-08-29 ENCOUNTER — Other Ambulatory Visit: Payer: Self-pay

## 2019-08-29 VITALS — BP 111/69 | HR 89 | Temp 99.1°F | Resp 18 | Ht 66.0 in

## 2019-08-29 DIAGNOSIS — C787 Secondary malignant neoplasm of liver and intrahepatic bile duct: Secondary | ICD-10-CM | POA: Diagnosis not present

## 2019-08-29 DIAGNOSIS — Z5189 Encounter for other specified aftercare: Secondary | ICD-10-CM | POA: Diagnosis not present

## 2019-08-29 DIAGNOSIS — Z452 Encounter for adjustment and management of vascular access device: Secondary | ICD-10-CM | POA: Diagnosis not present

## 2019-08-29 DIAGNOSIS — R197 Diarrhea, unspecified: Secondary | ICD-10-CM | POA: Diagnosis not present

## 2019-08-29 DIAGNOSIS — C182 Malignant neoplasm of ascending colon: Secondary | ICD-10-CM

## 2019-08-29 DIAGNOSIS — R21 Rash and other nonspecific skin eruption: Secondary | ICD-10-CM | POA: Diagnosis not present

## 2019-08-29 DIAGNOSIS — K632 Fistula of intestine: Secondary | ICD-10-CM | POA: Diagnosis not present

## 2019-08-29 DIAGNOSIS — Z5111 Encounter for antineoplastic chemotherapy: Secondary | ICD-10-CM | POA: Diagnosis not present

## 2019-08-29 DIAGNOSIS — D509 Iron deficiency anemia, unspecified: Secondary | ICD-10-CM | POA: Diagnosis not present

## 2019-08-29 DIAGNOSIS — Z5112 Encounter for antineoplastic immunotherapy: Secondary | ICD-10-CM | POA: Diagnosis not present

## 2019-08-29 DIAGNOSIS — D696 Thrombocytopenia, unspecified: Secondary | ICD-10-CM | POA: Diagnosis not present

## 2019-08-29 MED ORDER — PEGFILGRASTIM-CBQV 6 MG/0.6ML ~~LOC~~ SOSY
6.0000 mg | PREFILLED_SYRINGE | Freq: Once | SUBCUTANEOUS | Status: AC
  Administered 2019-08-29: 6 mg via SUBCUTANEOUS

## 2019-09-01 ENCOUNTER — Telehealth: Payer: Self-pay | Admitting: *Deleted

## 2019-09-01 ENCOUNTER — Encounter: Payer: Self-pay | Admitting: Oncology

## 2019-09-01 NOTE — Telephone Encounter (Signed)
Attempted reach patient to discuss her report of weakness in LLE since after 08/29/19. Left VM requesting return call.

## 2019-09-02 NOTE — Telephone Encounter (Signed)
Informed patient that Dr. Benay Spice wants to see her tomorrow. Provided appointment for 0945.

## 2019-09-02 NOTE — Telephone Encounter (Signed)
Spoke w/patient this morning. She reports she started to experience heavy feeling and weakness in her LLE soon after her Udenyca injection on 7/24 (given in arm). This persists today with no improvement and has resulted in several episodes of tripping due to not lifting her foot when walking. She has no numbness or back pain. No loss of bowel or bladder control (still having diarrhea w/no relief w/Lomotil). Denies any dizziness, headaches or visual disturbance.

## 2019-09-03 ENCOUNTER — Inpatient Hospital Stay: Payer: BC Managed Care – PPO

## 2019-09-03 ENCOUNTER — Ambulatory Visit (HOSPITAL_COMMUNITY)
Admission: RE | Admit: 2019-09-03 | Discharge: 2019-09-03 | Disposition: A | Discharge status: Home / Self Care | Payer: BC Managed Care – PPO | Source: Ambulatory Visit | Attending: Nurse Practitioner | Admitting: Nurse Practitioner

## 2019-09-03 ENCOUNTER — Inpatient Hospital Stay (HOSPITAL_BASED_OUTPATIENT_CLINIC_OR_DEPARTMENT_OTHER): Payer: BC Managed Care – PPO | Admitting: Nurse Practitioner

## 2019-09-03 ENCOUNTER — Other Ambulatory Visit: Payer: Self-pay

## 2019-09-03 ENCOUNTER — Encounter: Payer: Self-pay | Admitting: Nurse Practitioner

## 2019-09-03 ENCOUNTER — Other Ambulatory Visit: Payer: Self-pay | Admitting: *Deleted

## 2019-09-03 VITALS — BP 122/82 | HR 84 | Temp 97.3°F | Resp 18 | Wt 141.4 lb

## 2019-09-03 DIAGNOSIS — C787 Secondary malignant neoplasm of liver and intrahepatic bile duct: Secondary | ICD-10-CM | POA: Insufficient documentation

## 2019-09-03 DIAGNOSIS — D509 Iron deficiency anemia, unspecified: Secondary | ICD-10-CM | POA: Diagnosis not present

## 2019-09-03 DIAGNOSIS — Z5189 Encounter for other specified aftercare: Secondary | ICD-10-CM | POA: Diagnosis not present

## 2019-09-03 DIAGNOSIS — K632 Fistula of intestine: Secondary | ICD-10-CM | POA: Diagnosis not present

## 2019-09-03 DIAGNOSIS — Z95828 Presence of other vascular implants and grafts: Secondary | ICD-10-CM | POA: Diagnosis not present

## 2019-09-03 DIAGNOSIS — R21 Rash and other nonspecific skin eruption: Secondary | ICD-10-CM | POA: Diagnosis not present

## 2019-09-03 DIAGNOSIS — C182 Malignant neoplasm of ascending colon: Secondary | ICD-10-CM | POA: Insufficient documentation

## 2019-09-03 DIAGNOSIS — Z452 Encounter for adjustment and management of vascular access device: Secondary | ICD-10-CM | POA: Diagnosis not present

## 2019-09-03 DIAGNOSIS — R197 Diarrhea, unspecified: Secondary | ICD-10-CM | POA: Diagnosis not present

## 2019-09-03 DIAGNOSIS — Z5111 Encounter for antineoplastic chemotherapy: Secondary | ICD-10-CM | POA: Diagnosis not present

## 2019-09-03 DIAGNOSIS — Z5112 Encounter for antineoplastic immunotherapy: Secondary | ICD-10-CM | POA: Diagnosis not present

## 2019-09-03 DIAGNOSIS — R569 Unspecified convulsions: Secondary | ICD-10-CM | POA: Diagnosis not present

## 2019-09-03 DIAGNOSIS — D696 Thrombocytopenia, unspecified: Secondary | ICD-10-CM | POA: Diagnosis not present

## 2019-09-03 DIAGNOSIS — C189 Malignant neoplasm of colon, unspecified: Secondary | ICD-10-CM | POA: Diagnosis not present

## 2019-09-03 LAB — CBC WITH DIFFERENTIAL (CANCER CENTER ONLY)
Abs Immature Granulocytes: 0.21 10*3/uL — ABNORMAL HIGH (ref 0.00–0.07)
Basophils Absolute: 0.1 10*3/uL (ref 0.0–0.1)
Basophils Relative: 1 %
Eosinophils Absolute: 0.2 10*3/uL (ref 0.0–0.5)
Eosinophils Relative: 2 %
HCT: 34.3 % — ABNORMAL LOW (ref 36.0–46.0)
Hemoglobin: 10.6 g/dL — ABNORMAL LOW (ref 12.0–15.0)
Immature Granulocytes: 2 %
Lymphocytes Relative: 10 %
Lymphs Abs: 1 10*3/uL (ref 0.7–4.0)
MCH: 24.8 pg — ABNORMAL LOW (ref 26.0–34.0)
MCHC: 30.9 g/dL (ref 30.0–36.0)
MCV: 80.1 fL (ref 80.0–100.0)
Monocytes Absolute: 0.6 10*3/uL (ref 0.1–1.0)
Monocytes Relative: 5 %
Neutro Abs: 8.4 10*3/uL — ABNORMAL HIGH (ref 1.7–7.7)
Neutrophils Relative %: 80 %
Platelet Count: 141 10*3/uL — ABNORMAL LOW (ref 150–400)
RBC: 4.28 MIL/uL (ref 3.87–5.11)
RDW: 16.9 % — ABNORMAL HIGH (ref 11.5–15.5)
WBC Count: 10.3 10*3/uL (ref 4.0–10.5)
nRBC: 0 % (ref 0.0–0.2)

## 2019-09-03 LAB — CMP (CANCER CENTER ONLY)
ALT: 20 U/L (ref 0–44)
AST: 17 U/L (ref 15–41)
Albumin: 3.8 g/dL (ref 3.5–5.0)
Alkaline Phosphatase: 183 U/L — ABNORMAL HIGH (ref 38–126)
Anion gap: 12 (ref 5–15)
BUN: 6 mg/dL (ref 6–20)
CO2: 24 mmol/L (ref 22–32)
Calcium: 9.9 mg/dL (ref 8.9–10.3)
Chloride: 106 mmol/L (ref 98–111)
Creatinine: 0.75 mg/dL (ref 0.44–1.00)
GFR, Est AFR Am: >60 mL/min (ref >60)
GFR, Estimated: >60 mL/min (ref >60)
Glucose, Bld: 161 mg/dL — ABNORMAL HIGH (ref 70–99)
Potassium: 4 mmol/L (ref 3.5–5.1)
Sodium: 142 mmol/L (ref 135–145)
Total Bilirubin: 0.6 mg/dL (ref 0.3–1.2)
Total Protein: 6.7 g/dL (ref 6.5–8.1)

## 2019-09-03 MED ORDER — SODIUM CHLORIDE 0.9 % IV SOLN
INTRAVENOUS | Status: DC
  Filled 2019-09-03 (×2): qty 250

## 2019-09-03 MED ORDER — SODIUM CHLORIDE 0.9 % IV SOLN
INTRAVENOUS | Status: AC
  Filled 2019-09-03 (×2): qty 250

## 2019-09-03 MED ORDER — HEPARIN SOD (PORK) LOCK FLUSH 100 UNIT/ML IV SOLN
500.0000 [IU] | Freq: Once | INTRAVENOUS | Status: AC
  Administered 2019-09-03: 500 [IU] via INTRAVENOUS
  Filled 2019-09-03: qty 5

## 2019-09-03 MED ORDER — SODIUM CHLORIDE 0.9% FLUSH
10.0000 mL | INTRAVENOUS | Status: DC | PRN
  Administered 2019-09-03: 10 mL via INTRAVENOUS
  Filled 2019-09-03: qty 10

## 2019-09-03 MED ORDER — SODIUM CHLORIDE 0.9 % IV SOLN
Freq: Once | INTRAVENOUS | Status: DC
  Filled 2019-09-03: qty 250

## 2019-09-03 MED ORDER — DIPHENOXYLATE-ATROPINE 2.5-0.025 MG PO TABS: ORAL_TABLET | ORAL | 1 refills | Status: DC

## 2019-09-03 MED ORDER — SODIUM CHLORIDE 0.9% FLUSH
10.0000 mL | Freq: Once | INTRAVENOUS | Status: AC
  Administered 2019-09-03: 10 mL
  Filled 2019-09-03: qty 10

## 2019-09-03 MED ORDER — GADOBUTROL 1 MMOL/ML IV SOLN
6.0000 mL | Freq: Once | INTRAVENOUS | Status: AC | PRN
  Administered 2019-09-03: 6 mL via INTRAVENOUS

## 2019-09-03 NOTE — Progress Notes (Addendum)
[facility] OFFICE PROGRESS NOTE   Diagnosis: Colon cancer  INTERVAL HISTORY:   [name] returns prior to scheduled follow-up for evaluation of left leg weakness.  She completed an 11th cycle of systemic therapy with irinotecan/Panitumumab on 08/27/2019.  She received white cell growth factor support on 08/29/2019.  As she was leaving the office she noted that the left leg felt "heavy" and she was having difficulty lifting the left foot.  This has continued intermittently since.  She especially notices it after prolonged sitting.  She denies back pain.  No bowel or bladder incontinence.  No fever.  No unusual headaches.  No double vision.  She has had increased diarrhea over the past few days and "constipation" like abdominal pain.  She is taking 2 Lomotil every 4 hours.  Objective:  Vital signs in last 24 hours:  Blood pressure 122/82, pulse 84, temperature (!) 97.3 F (36.3 C), temperature source Temporal, resp. rate 18, weight 141 lb 6.4 oz (64.1 kg), SpO2 92 %.    HEENT: No thrush or ulcers.  Sclera anicteric. Resp: Lungs clear bilaterally. Cardio: Regular rate and rhythm. GI: Abdomen soft and nontender.  No hepatomegaly.  Bandage left abdomen over the fistula site. Vascular: No leg edema. Neuro: Face is symmetric.  Pupils equal round and reactive to light.  Extraocular movements intact.  Decreased sensation to light touch left face and neck.  Speech normal.  Follows commands.  Alert and oriented.  Upper extremity and right lower extremity motor strength is 5/5.  Left lower extremity proximal strength is intact.  Trace weakness at the left lower leg.  More pronounced weakness at the left foot, left foot drop. Port-A-Cath without erythema.   Lab Results:  Lab Results  Component Value Date   WBC 7.2 08/27/2019   HGB 11.1 (L) 08/27/2019   HCT 36.7 08/27/2019   MCV 80.8 08/27/2019   PLT 125 (L) 08/27/2019   NEUTROABS 5.2 08/27/2019    Imaging:  No results  found.  Medications: I have reviewed the patient's current medications.  Assessment/Plan: 1. 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  The tumor returned microsatellite stable with no loss of mismatch repair protein expression   APC mutated. No BRAF, KRAS, or NRAS mutation On [facility] 1 testing   Cycle 1 adjuvant FOLFOX 09/08/2013   Cycle 2 adjuvant FOLFOX 09/24/2013   Cycle 3 adjuvant FOLFOX 10/08/2013.   Cycle 4 adjuvant FOLFOX 10/22/2013.   Cycle 5 adjuvant FOLFOX 11/05/2013. Oxaliplatin held due to thrombocytopenia.  Cycle 6 FOLFOX 11/19/2013.  Cycle 7 FOLFOX 12/03/2013. Oxaliplatin held secondary to thrombocytopenia.  Cycle 8 FOLFOX 12/17/2013.  Cycle 9 FOLFOX 01/04/2014. Oxaliplatin held secondary to neutropenia.   Cycle 10 FOLFOX 01/21/2014. Oxaliplatin held secondary to thrombocytopenia.  Cycle 11 FOLFOX 02/04/2014  Cycle 12 FOLFOX 02/18/2014, oxaliplatin dose reduced secondary tothrombocytopenia  CT abdomen/pelvis 01/30/2014 revealed splenomegaly and no evidence of recurrent colon cancer  CT chest 04/07/2014 with a stable right lower lobe nodule and no evidence for metastatic disease, no nodules seen on the CT 11/26/2014  Markedly elevated CEA 11/24/2014  CT 11/26/2014 revealed a right pelvic mass, splenomegaly, small volume ascites  Right salpingo-oophorectomy 12/28/2014 with the pathology confirming metastatic colon cancer  CTs 03/23/2016-no evidence of recurrent or metastatic disease.  CT 11/26/2016-enlargement of a fluid density structure the right pelvic sidewall, no other evidence of metastatic disease  CT aspiration right pelvic cyst 12/19/2016.Cytology-BENIGN REACTIVE/REPARATIVE CHANGES.  CTs 06/05/2017-no evidence of metastatic disease, mild cirrhotic changes with splenomegaly  CTs 12/11/2017-recurrent cystic right adnexal mass, similar  to on the CT 11/26/2016, stable mild splenomegaly  CTs 04/23/2018-enlargement of cystic right adnexal mass with mural nodularity, no other evidence of metastatic disease  Cytoreductive surgery/HIPEC with mitomycin by Dr. Clovis Riley at [facility] 08/12/2018-R1 resection achieved. Cytoreduction included omentectomy, LAR, right salpingo-oophorectomy and left colonic gutter/pelvic stripping. Pathology on the rectum showed recurrent/metastatic adenocarcinoma, tumor 2.0 cm, predominantly involving the subserosa and muscularis propria of the colon, proximal and distal margins of resection were negative, vascular invasion present, metastatic carcinoma present in 1 out of 5 lymph nodes; omentum resection with no malignancy seen, no metastatic carcinoma identified in 1 lymph node examined; left gutter stripping positive metastatic adenocarcinoma; right ovary resection positive metastatic adenocarcinoma.  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  CEA 68 02/03/2019  Biopsy liver lesion 02/11/2019-metastatic adenocarcinoma consistent with primary colonic adenocarcinoma  CTs 02/27/2019-multiple liver lesions increased in size, new lesion in the lateral right lobe of the liver. No evidence of metastatic disease in the chest. Redemonstrated moderate left hydronephrosis and proximal hydroureter without discrete lesion or obstructing etiology at the transition point of the mid ureter.  Cycle 1 FOLFIRI/Panitumumab 03/12/2019  Cycle 2 FOLFIRI/Panitumumab 03/26/2019-bolus 5-FU and irinotecan held secondary to neutropenia  Cycle 3 FOLFIRI/Panitumumab 04/09/2019, Udenyca added-not given secondary to seizure/discontinuation of the 5-FU pump  CT abdomen/pelvis at [facility] 05/04/2019-no residual fluid collection at the left abdominal wall abscess, stable moderate left hydronephrosis, multifocal indeterminate liver lesions--liver lesionssignificantly  improved  Cycle 4 FOLFIRI/Panitumumab 05/07/2019, Udenyca  Cycle 5 FOLFIRI/Panitumumab 05/20/2019, Udenyca  Cycle 6 FOLFIRI/Panitumumab 06/18/2019, Udenyca  Cycle 7 FOLFIRI/Panitumumab 07/01/2019 (Irinotecan dose reduced due to thrombocytopenia), Udenyca--Udenyca was not given  Cycle 8 FOLFIRI/Panitumumab 07/15/2019, Udenyca  Cycle 9 FOLFIRI/Panitumumab 07/29/2019, Udenyca  Cycle 10 FOLFIRI/Panitumumab 08/13/2019, Udenyca  CTs at [facility] 08/19/2019-decreased size of the hypodense and hyperdense lesions within segment 2 of the left hepatic lobe.  No new lesions identified.  Similar presacral soft tissue thickening with adjacent stable alignment in the rectum.  Improved but persistent left UPJ obstruction with mild left hydronephrosis.  Persistent but decreased size of the wound within the left mid abdomen abdominal wall.  Cycle 11 irinotecan/Panitumumab 08/27/2019, Udenyca 2. Mild elevation of the CEA beginning January 2016, normal on 05/19/2014   3. History of iron deficiency anemia  4. seizure disorder; seizure 04/09/2019. Brain CT negative. Now on Keppra. 5. history of depression  6. 4 mm right lower lobe nodule on a staging chest CT 09/08/2013 , stable on a CT 04/07/2014 7. Hospitalization 09/24/2013 through 09/26/2013 with fever and abdominal pain.  8. 09/24/2013 urine culture positive for coag negative staph.  9. 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 Splenomegaly noted on a CT scan 01/30/2014,persistent on repeat CTs 12. Colonoscopy 11/17/2018-flexible sigmoidoscopy per rectum with changes of mild diversion colitis. Scope advanced for approximately 25 cm. Most proximal portion had necrotic appearing debris. Colostomy bag insufflated suggesting some type of communication between the pouch and the proximal colon. Introduction of scope into the ostomy found to available directions. 1  toward the distal pouch with similar appearing necrotic debris encountered. The other was about a 30 cm segment of normal-appearing colonic mucosa to the level of the previous right hemicolectomy ileocolonic anastomosis. 45. Port-A-Cath placement interventional radiology 03/05/2019 14. 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  Since 08/29/2019 she has noticed left leg weakness.  On exam she has weakness at the lower leg and foot and altered sensation to light touch on the left side of her face and neck.  We are referring her for a stat brain MRI.  She is having diarrhea most likely related to irinotecan.  We are giving her IV fluids today.  We will schedule additional follow-up pending MRI results.  Patient seen with Dr. Benay Spice.  Ned Card ANP/GNP-BC   09/03/2019  10:32 AM  This was a shared visit with Ned Card.  [name] was interviewed and examined.  She has mild weakness of the left lower extremity, most pronounced with dorsi flexion at the left foot.  She will be referred for a brain MRI today.  Julieanne Manson, MD

## 2019-09-03 NOTE — Addendum Note (Signed)
Addended by: Tania Ade on: 09/03/2019 02:43 PM   Modules accepted: Orders, SmartSet

## 2019-09-05 ENCOUNTER — Other Ambulatory Visit: Payer: Self-pay | Admitting: Oncology

## 2019-09-07 ENCOUNTER — Other Ambulatory Visit: Payer: Self-pay | Admitting: *Deleted

## 2019-09-07 ENCOUNTER — Telehealth: Payer: Self-pay | Admitting: Nurse Practitioner

## 2019-09-07 DIAGNOSIS — C182 Malignant neoplasm of ascending colon: Secondary | ICD-10-CM

## 2019-09-07 NOTE — Progress Notes (Signed)
Referral order placed for PT at [facility] location for LLE weakness per Dr. Benay Spice. Confirmed with rehab center [phone] they have the referral and will call today.

## 2019-09-07 NOTE — Telephone Encounter (Signed)
No 7/29 los, no changes made to pt schedule

## 2019-09-09 ENCOUNTER — Other Ambulatory Visit: Payer: Self-pay

## 2019-09-09 ENCOUNTER — Encounter: Payer: Self-pay | Admitting: Nurse Practitioner

## 2019-09-10 ENCOUNTER — Emergency Department (HOSPITAL_COMMUNITY)
Admission: EM | Admit: 2019-09-10 | Discharge: 2019-09-10 | Disposition: A | Discharge status: Home / Self Care | Payer: BC Managed Care – PPO | Attending: Emergency Medicine | Admitting: Emergency Medicine

## 2019-09-10 ENCOUNTER — Encounter: Payer: Self-pay | Admitting: Nurse Practitioner

## 2019-09-10 ENCOUNTER — Inpatient Hospital Stay: Payer: BC Managed Care – PPO

## 2019-09-10 ENCOUNTER — Other Ambulatory Visit: Payer: Self-pay

## 2019-09-10 ENCOUNTER — Encounter (HOSPITAL_COMMUNITY): Payer: Self-pay | Admitting: Emergency Medicine

## 2019-09-10 ENCOUNTER — Inpatient Hospital Stay: Payer: BC Managed Care – PPO | Attending: Oncology | Admitting: Nurse Practitioner

## 2019-09-10 ENCOUNTER — Emergency Department (HOSPITAL_COMMUNITY): Payer: BC Managed Care – PPO

## 2019-09-10 VITALS — BP 104/75 | HR 82 | Temp 97.7°F | Resp 17 | Ht 66.0 in | Wt 142.2 lb

## 2019-09-10 DIAGNOSIS — J90 Pleural effusion, not elsewhere classified: Secondary | ICD-10-CM | POA: Diagnosis not present

## 2019-09-10 DIAGNOSIS — Z20822 Contact with and (suspected) exposure to covid-19: Secondary | ICD-10-CM | POA: Insufficient documentation

## 2019-09-10 DIAGNOSIS — R161 Splenomegaly, not elsewhere classified: Secondary | ICD-10-CM | POA: Insufficient documentation

## 2019-09-10 DIAGNOSIS — Z85038 Personal history of other malignant neoplasm of large intestine: Secondary | ICD-10-CM | POA: Diagnosis not present

## 2019-09-10 DIAGNOSIS — J069 Acute upper respiratory infection, unspecified: Secondary | ICD-10-CM | POA: Insufficient documentation

## 2019-09-10 DIAGNOSIS — C787 Secondary malignant neoplasm of liver and intrahepatic bile duct: Secondary | ICD-10-CM | POA: Diagnosis not present

## 2019-09-10 DIAGNOSIS — Z933 Colostomy status: Secondary | ICD-10-CM | POA: Insufficient documentation

## 2019-09-10 DIAGNOSIS — C182 Malignant neoplasm of ascending colon: Secondary | ICD-10-CM | POA: Insufficient documentation

## 2019-09-10 DIAGNOSIS — D696 Thrombocytopenia, unspecified: Secondary | ICD-10-CM | POA: Insufficient documentation

## 2019-09-10 DIAGNOSIS — R509 Fever, unspecified: Secondary | ICD-10-CM | POA: Diagnosis not present

## 2019-09-10 DIAGNOSIS — G40909 Epilepsy, unspecified, not intractable, without status epilepticus: Secondary | ICD-10-CM | POA: Insufficient documentation

## 2019-09-10 DIAGNOSIS — B9789 Other viral agents as the cause of diseases classified elsewhere: Secondary | ICD-10-CM | POA: Diagnosis not present

## 2019-09-10 DIAGNOSIS — R05 Cough: Secondary | ICD-10-CM | POA: Diagnosis not present

## 2019-09-10 DIAGNOSIS — Z95828 Presence of other vascular implants and grafts: Secondary | ICD-10-CM

## 2019-09-10 DIAGNOSIS — U071 COVID-19: Secondary | ICD-10-CM | POA: Diagnosis not present

## 2019-09-10 LAB — CMP (CANCER CENTER ONLY)
ALT: 26 U/L (ref 0–44)
AST: 40 U/L (ref 15–41)
Albumin: 3.7 g/dL (ref 3.5–5.0)
Alkaline Phosphatase: 121 U/L (ref 38–126)
Anion gap: 11 (ref 5–15)
BUN: 8 mg/dL (ref 6–20)
CO2: 25 mmol/L (ref 22–32)
Calcium: 8.6 mg/dL — ABNORMAL LOW (ref 8.9–10.3)
Chloride: 103 mmol/L (ref 98–111)
Creatinine: 0.77 mg/dL (ref 0.44–1.00)
GFR, Est AFR Am: >60 mL/min (ref >60)
GFR, Estimated: >60 mL/min (ref >60)
Glucose, Bld: 158 mg/dL — ABNORMAL HIGH (ref 70–99)
Potassium: 3.4 mmol/L — ABNORMAL LOW (ref 3.5–5.1)
Sodium: 139 mmol/L (ref 135–145)
Total Bilirubin: 0.9 mg/dL (ref 0.3–1.2)
Total Protein: 6.4 g/dL — ABNORMAL LOW (ref 6.5–8.1)

## 2019-09-10 LAB — CBC WITH DIFFERENTIAL (CANCER CENTER ONLY)
Abs Immature Granulocytes: 0.11 10*3/uL — ABNORMAL HIGH (ref 0.00–0.07)
Basophils Absolute: 0 10*3/uL (ref 0.0–0.1)
Basophils Relative: 0 %
Eosinophils Absolute: 0 10*3/uL (ref 0.0–0.5)
Eosinophils Relative: 0 %
HCT: 34.1 % — ABNORMAL LOW (ref 36.0–46.0)
Hemoglobin: 10.4 g/dL — ABNORMAL LOW (ref 12.0–15.0)
Immature Granulocytes: 2 %
Lymphocytes Relative: 13 %
Lymphs Abs: 0.7 10*3/uL (ref 0.7–4.0)
MCH: 24.5 pg — ABNORMAL LOW (ref 26.0–34.0)
MCHC: 30.5 g/dL (ref 30.0–36.0)
MCV: 80.4 fL (ref 80.0–100.0)
Monocytes Absolute: 0.4 10*3/uL (ref 0.1–1.0)
Monocytes Relative: 8 %
Neutro Abs: 4.4 10*3/uL (ref 1.7–7.7)
Neutrophils Relative %: 77 %
Platelet Count: 114 10*3/uL — ABNORMAL LOW (ref 150–400)
RBC: 4.24 MIL/uL (ref 3.87–5.11)
RDW: 18 % — ABNORMAL HIGH (ref 11.5–15.5)
WBC Count: 5.7 10*3/uL (ref 4.0–10.5)
nRBC: 0 % (ref 0.0–0.2)

## 2019-09-10 LAB — SARS CORONAVIRUS 2 BY RT PCR (HOSPITAL ORDER, PERFORMED IN ~~LOC~~ HOSPITAL LAB): SARS Coronavirus 2: POSITIVE — AB

## 2019-09-10 LAB — MAGNESIUM: Magnesium: 1.1 mg/dL — CL (ref 1.7–2.4)

## 2019-09-10 LAB — CEA (IN HOUSE-CHCC): CEA (CHCC-In House): 10.93 ng/mL — ABNORMAL HIGH (ref 0.00–5.00)

## 2019-09-10 LAB — GROUP A STREP BY PCR: Group A Strep by PCR: NOT DETECTED

## 2019-09-10 MED ORDER — MAGNESIUM SULFATE 2 GM/50ML IV SOLN
2.0000 g | Freq: Once | INTRAVENOUS | Status: AC
  Administered 2019-09-10: 2 g via INTRAVENOUS
  Filled 2019-09-10: qty 50

## 2019-09-10 MED ORDER — SODIUM CHLORIDE 0.9% FLUSH
10.0000 mL | Freq: Once | INTRAVENOUS | Status: AC
  Administered 2019-09-10: 10 mL
  Filled 2019-09-10: qty 10

## 2019-09-10 MED ORDER — CEPHALEXIN 500 MG PO CAPS: 500.0000 mg | ORAL_CAPSULE | Freq: Three times a day (TID) | ORAL | 0 refills | Status: DC

## 2019-09-10 MED ORDER — MAGNESIUM SULFATE 50 % IJ SOLN: 2.0000 g | Freq: Once | INTRAMUSCULAR | Status: DC

## 2019-09-10 NOTE — ED Triage Notes (Signed)
Pt was at [facility] for chemotherapy. Pt reports fever and scratchy throat sent to r/o COVID.

## 2019-09-10 NOTE — Progress Notes (Signed)
[place] OFFICE PROGRESS NOTE   Diagnosis: Colon cancer  INTERVAL HISTORY:   [name] returns as scheduled.  She completed a cycle of irinotecan/Panitumumab on 08/27/2019.  She reports a fever of 101.6 this morning.  She took Tylenol.  She has had intermittent fever over the past 48 hours.  No chills.  She has a cough which she attributes to nasal congestion.  She reports the nasal discharge is "green".  She denies shortness of breath.  Diarrhea is unchanged.  She continues to have left foot weakness.  She has an appointment in the next few weeks with physical therapy.  She has not had the Covid vaccine.  Objective:  Vital signs in last 24 hours:  Blood pressure 104/75, pulse 82, temperature 97.7 F (36.5 C), temperature source Temporal, resp. rate 17, height 5' 6"  (1.676 m), weight 142 lb 3.2 oz (64.5 kg), SpO2 100 %.    HEENT: No thrush or ulcers. Resp: Lungs clear bilaterally. Cardio: Regular rate and rhythm. GI: Abdomen soft.  No hepatomegaly.  Fistula site with mild surrounding erythema, mild tenderness to the left of the fistula site. Vascular: Trace edema left foot. Neuro: Persistent left foot drop.  Port-A-Cath without erythema.  Lab Results:  Lab Results  Component Value Date   WBC 10.3 09/03/2019   HGB 10.6 (L) 09/03/2019   HCT 34.3 (L) 09/03/2019   MCV 80.1 09/03/2019   PLT 141 (L) 09/03/2019   NEUTROABS 8.4 (H) 09/03/2019    Imaging:  No results found.  Medications: I have reviewed the patient's current medications.  Assessment/Plan: 1. 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  The tumor returned microsatellite stable with no loss of mismatch repair protein expression   APC mutated. No BRAF, KRAS, or NRAS mutation On Foundation 1 testing   Cycle 1 adjuvant FOLFOX 09/08/2013   Cycle 2 adjuvant FOLFOX 09/24/2013   Cycle 3  adjuvant FOLFOX 10/08/2013.   Cycle 4 adjuvant FOLFOX 10/22/2013.   Cycle 5 adjuvant FOLFOX 11/05/2013. Oxaliplatin held due to thrombocytopenia.  Cycle 6 FOLFOX 11/19/2013.  Cycle 7 FOLFOX 12/03/2013. Oxaliplatin held secondary to thrombocytopenia.  Cycle 8 FOLFOX 12/17/2013.  Cycle 9 FOLFOX 01/04/2014. Oxaliplatin held secondary to neutropenia.   Cycle 10 FOLFOX 01/21/2014. Oxaliplatin held secondary to thrombocytopenia.  Cycle 11 FOLFOX 02/04/2014  Cycle 12 FOLFOX 02/18/2014, oxaliplatin dose reduced secondary tothrombocytopenia  CT abdomen/pelvis 01/30/2014 revealed splenomegaly and no evidence of recurrent colon cancer  CT chest 04/07/2014 with a stable right lower lobe nodule and no evidence for metastatic disease, no nodules seen on the CT 11/26/2014  Markedly elevated CEA 11/24/2014  CT 11/26/2014 revealed a right pelvic mass, splenomegaly, small volume ascites  Right salpingo-oophorectomy 12/28/2014 with the pathology confirming metastatic colon cancer  CTs 03/23/2016-no evidence of recurrent or metastatic disease.  CT 11/26/2016-enlargement of a fluid density structure the right pelvic sidewall, no other evidence of metastatic disease  CT aspiration right pelvic cyst 12/19/2016.Cytology-BENIGN REACTIVE/REPARATIVE CHANGES.  CTs 06/05/2017-no evidence of metastatic disease, mild cirrhotic changes with splenomegaly  CTs 12/11/2017-recurrent cystic right adnexal mass, similar to on the CT 11/26/2016, stable mild splenomegaly  CTs 04/23/2018-enlargement of cystic right adnexal mass with mural nodularity, no other evidence of metastatic disease  Cytoreductive surgery/HIPEC with mitomycin by Dr. Clovis Riley at [facility] 08/12/2018-R1 resection achieved. Cytoreduction included omentectomy, LAR, right salpingo-oophorectomy and left colonic gutter/pelvic stripping. Pathology on the rectum showed recurrent/metastatic adenocarcinoma, tumor 2.0 cm, predominantly involving the  subserosa and muscularis propria of the colon, proximal and distal margins of resection were negative, vascular invasion present, metastatic carcinoma present in 1 out of 5 lymph nodes; omentum resection with no malignancy seen, no metastatic carcinoma identified in 1 lymph node examined; left gutter stripping positive metastatic adenocarcinoma; right ovary resection positive metastatic adenocarcinoma.  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  CEA 68 02/03/2019  Biopsy liver lesion 02/11/2019-metastatic adenocarcinoma consistent with primary colonic adenocarcinoma  CTs 02/27/2019-multiple liver lesions increased in size, new lesion in the lateral right lobe of the liver. No evidence of metastatic disease in the chest. Redemonstrated moderate left hydronephrosis and proximal hydroureter without discrete lesion or obstructing etiology at the transition point of the mid ureter.  Cycle 1 FOLFIRI/Panitumumab 03/12/2019  Cycle 2 FOLFIRI/Panitumumab 03/26/2019-bolus 5-FU and irinotecan held secondary to neutropenia  Cycle 3 FOLFIRI/Panitumumab 04/09/2019, Udenyca added-not given secondary to seizure/discontinuation of the 5-FU pump  CT abdomen[facility]pelvis at [facility] 05/04/2019-no residual fluid collection at the left abdominal wall abscess, stable moderate left hydronephrosis, multifocal indeterminate liver lesions--liver lesionssignificantly improved  Cycle 4 FOLFIRI/Panitumumab 05/07/2019, Udenyca  Cycle 5 FOLFIRI/Panitumumab 05/20/2019, Udenyca  Cycle 6 FOLFIRI/Panitumumab 06/18/2019, Udenyca  Cycle 7 FOLFIRI/Panitumumab 07/01/2019 (Irinotecan dose reduced due to thrombocytopenia), Udenyca--Udenyca was not given  Cycle 8 FOLFIRI/Panitumumab 07/15/2019, Udenyca  Cycle 9 FOLFIRI/Panitumumab 07/29/2019, Udenyca  Cycle 10 FOLFIRI/Panitumumab 08/13/2019, Udenyca  CTs at [facility] 08/19/2019-decreased size of the hypodense and  hyperdense lesions within segment 2 of the left hepatic lobe. No new lesions identified. Similar presacral soft tissue thickening with adjacent stable alignment in the rectum. Improved but persistent left UPJ obstruction with mild left hydronephrosis. Persistent but decreased size of the wound within the left mid abdomen abdominal wall.  Cycle 11 irinotecan/Panitumumab 08/27/2019, Udenyca 2. Mild elevation of the CEA beginning January 2016, normal on 05/19/2014   3. History of iron deficiency anemia  4. seizure disorder; seizure 04/09/2019. Brain CT negative. Now on Keppra. 5. history of depression  6. 4 mm right lower lobe nodule on a staging chest CT 09/08/2013 , stable on a CT 04/07/2014 7. Hospitalization 09/24/2013 through 09/26/2013 with fever and abdominal pain.  8. 09/24/2013 urine culture positive for coag negative staph.  9. 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 Splenomegaly noted on a CT scan 01/30/2014,persistent on repeat CTs 12. Colonoscopy 11/17/2018-flexible sigmoidoscopy per rectum with changes of mild diversion colitis. Scope advanced for approximately 25 cm. Most proximal portion had necrotic appearing debris. Colostomy bag insufflated suggesting some type of communication between the pouch and the proximal colon. Introduction of scope into the ostomy found to available directions. 1 toward the distal pouch with similar appearing necrotic debris encountered. The other was about a 30 cm segment of normal-appearing colonic mucosa to the level of the previous right hemicolectomy ileocolonic anastomosis. 86. Port-A-Cath placement interventional radiology 03/05/2019 14. Left leg/foot weakness-brain MRI 09/03/2019 with no evidence of metastatic disease, referred to physical therapy 15. 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 Bey appears unchanged.  She is on active treatment with irinotecan/Panitumumab.  She is scheduled for treatment today.  However, she reports  a 48-hour history of fever, cough, nasal congestion/discharge.  She has not been vaccinated for Covid.  We are holding today's treatment.  She will have a chest x-ray.  We are referring her for Covid testing.  Due to her immunocompromised status an antibiotic will be prescribed.  She has significant hypomagnesemia related to Panitumumab.  Unfortunately with her current symptoms we are unable to give an intravenous infusion of magnesium today at the [facility].  We contacted the charge nurse at the [facility] emergency department.  They agree to perform Covid testing, obtain baseline chest x-ray, IV magnesium infusion.  Her appointments here will be rescheduled.  Clinically she appears stable.  She understands to seek evaluation in the emergency department with any deterioration in her condition.  Plan discussed with Dr. Burr Medico.  Ned Card ANP/GNP-BC   09/10/2019  9:00 AM

## 2019-09-10 NOTE — Progress Notes (Signed)
Lab called to notify me that discharged patient had positive covid test.  I called patient and notified her, also giving current CDC guidelines for quarantining.

## 2019-09-10 NOTE — ED Provider Notes (Signed)
[place] DEPT Provider Note   CSN: [id number] Arrival date & time: 09/10/19  1010     History Chief Complaint  Patient presents with   Fever    [name] is a 44 y.o. female.   Fever Max temp prior to arrival:  101.6 Temp source:  Oral Severity:  Moderate Onset quality:  Sudden Duration:  1 day Timing:  Constant Progression:  Worsening Chronicity:  New Relieved by:  Acetaminophen Worsened by:  Nothing Ineffective treatments:  None tried Associated symptoms: congestion, cough and rhinorrhea   Associated symptoms: no chest pain, no chills, no diarrhea, no dysuria, no headaches, no nausea, no rash and no vomiting   Risk factors: immunosuppression        Past Medical History:  Diagnosis Date   ADD (attention deficit disorder)    Anemia, iron deficiency    Anxiety    Colon cancer ([place]) 08/2013   Depression    Difficulty swallowing pills    GERD (gastroesophageal reflux disease)    Headache    migraines "long time ago"   History of blood transfusion 08/16/2013   History of migraine    no problems in "a long time"   Metastatic adenocarcinoma of ovary, 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   Ileus ([place])    Metastatic malignant neoplasm ([place])    Genetic testing 11/13/2015   Sinusitis 01/29/2015   Pelvic mass in female 12/28/2014   Ovarian mass, right    Hypokalemia 02/03/2014   Nausea 12/20/2013   Fever 12/18/2013   Dehydration 12/18/2013   Diarrhea 10/28/2013   Malfunction of device 10/28/2013   Weight loss 10/28/2013   Mucositis (ulcerative) due to antineoplastic therapy 10/28/2013   Anemia due to antineoplastic chemotherapy 09/25/2013    Chemotherapy induced thrombocytopenia 09/25/2013   Hypomagnesemia 09/25/2013   Hypophosphatemia 09/25/2013   Adverse drug effect 09/25/2013   Abdominal pain 09/24/2013   Cancer of ascending colon s/p right colectomy 08/11/2013 08/05/2013   Anemia, iron deficiency 01/28/2013   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[place]) 11/11/2012   Discoloration of skin of face 09/03/2012   Medication reaction 05/03/2012   Migraine 03/31/2012   Hyperglycemia 01/18/2012   Fatigue 12/12/2011   ADD (attention deficit disorder) 09/23/2010   Right wrist pain 05/01/2010   LIBIDO, DECREASED 04/07/2010   IRON DEFICIENCY 05/17/2009   ANA POSITIVE 05/17/2009   OVARIAN CYST 05/10/2009   FATTY LIVER DISEASE 03/24/2009   TRANSAMINASES, SERUM, ELEVATED 03/21/2009   Gastroparesis 08/16/2008   DEPRESSION/ANXIETY 09/19/2006   HYPERLIPIDEMIA 08/16/2006   RESTLESS LEG SYNDROME 08/16/2006   GERD 08/16/2006   IBS 08/16/2006    Past Surgical History:  Procedure Laterality Date   ABDOMINAL HYSTERECTOMY  08/24/2004   partial   APPENDECTOMY  08/24/2004   COLON SURGERY  08/11/2013   removed a foot of colon   COLONOSCOPY  2019   colonoscopy 10-18-14     COLONOSCOPY WITH PROPOFOL  07/07/2013   CYSTOSCOPY  11/01/2003   ENDOMETRIAL FULGURATION  11/01/2003   ESOPHAGOGASTRODUODENOSCOPY  07/07/2013   IR IMAGING GUIDED PORT INSERTION  03/05/2019   LAPAROSCOPIC LYSIS OF ADHESIONS  11/01/2003   LAPAROSCOPIC PARTIAL COLECTOMY Right 08/11/2013   Procedure: LAPAROSCOPIC RIGHT HEMICOLECTOMY;  Surgeon: Dorris Fetch  Barry Dienes, MD;  Location: [facility];  Service: General;  Laterality: Right;   LAPAROTOMY N/A 12/28/2014   Procedure: EXPLORATORY LAPAROTOMY;  Surgeon: Everitt Amber, MD;  Location: [facility];  Service: Gynecology;  Laterality: N/A;   LEFT OOPHORECTOMY  08/24/2004   PANNICULECTOMY  08/24/2004   PORT-A-CATH REMOVAL Left 06/08/2014   Procedure: REMOVAL PORT-A-CATH;  Surgeon: Stark Klein, MD;  Location: [place] [facility];   Service: General;  Laterality: Left;   PORTACATH PLACEMENT Left 09/09/2013   Procedure: INSERTION PORT-A-CATH;  Surgeon: Stark Klein, MD;  Location: [facility];  Service: General;  Laterality: Left;   SALPINGOOPHORECTOMY Right 12/28/2014   Procedure: RIGHT SALPINGO OOPHORECTOMY;  Surgeon: Everitt Amber, MD;  Location: [facility];  Service: Gynecology;  Laterality: Right;   TUBAL LIGATION  11/07/2000   UNILATERAL SALPINGECTOMY Left 08/24/2004   WISDOM TOOTH EXTRACTION Bilateral 1994     OB History   No obstetric history on file.     Family History  Problem Relation Age of Onset   Colon polyps Mother        3+ colon polyps at each colonoscopy - "several"   Colitis Mother    Heart attack Mother    Diabetes Mother    Heart disease Mother    Diabetes Father    Heart attack Father    Stroke Father    Congestive Heart Failure Father    Heart disease Father    Cirrhosis Maternal Aunt    Lung cancer Maternal Uncle        d. 73s; smoker and worked around asbestos   Other Paternal Grandfather        "bone cancer"   Colon cancer Maternal Uncle        dx early 58s   Breast cancer Maternal Aunt        dx 50s-60s; s/p lumpectomy   Dementia Maternal Aunt    Other Cousin        maternal 1st cousin w/ "lung issues"   Dementia Paternal Aunt    Dementia Paternal Uncle    Prostate cancer Paternal Uncle 73   Esophageal cancer Neg Hx    Rectal cancer Neg Hx    Stomach cancer Neg Hx     Social History   Tobacco Use   Smoking status: Never Smoker   Smokeless tobacco: Never Used  Vaping Use   Vaping Use: Never used  Substance Use Topics   Alcohol use: No   Drug use: No    Home Medications Prior to Admission medications   Medication Sig Start Date End Date Taking? Authorizing Provider  acetaminophen (TYLENOL) 500 MG tablet Take 1,000 mg by mouth every 6 (six) hours as needed.    [provider]  alum & mag hydroxide-simeth  (MAALOX/MYLANTA) 200-200-20 MG/5ML suspension Take 30 mLs by mouth every 6 (six) hours as needed for indigestion or heartburn.    [provider]  cephALEXin (KEFLEX) 500 MG capsule Take 1 capsule (500 mg total) by mouth 3 (three) times daily. 09/10/19   Owens Shark, NP  diphenoxylate-atropine (LOMOTIL) 2.5-0.025 MG tablet TAKE 1- 2 TABLETS BY MOUTH 4 TIMES DAILY AS NEEDED FOR DIARRHEA /  LOOSE  STOOLS 09/03/19   [name], MD  doxycycline (VIBRA-TABS) 100 MG tablet Take 1 tablet (100 mg total) by mouth 2 (two) times daily. 06/29/19   Owens Shark, NP  fluticasone (CUTIVATE) 0.05 % cream Apply topically 2 (two) times daily. To rash 05/20/19   Owens Shark, NP  levETIRAcetam (KEPPRA) 500 MG tablet Take 1 tablet (500 mg total) by mouth 2 (two) times daily. 04/10/19   Barton Dubois, MD  lidocaine-prilocaine (EMLA) cream Apply to port site 1-2 hours prior to use 02/23/19   Owens Shark, NP  loperamide (IMODIUM) 2 MG capsule Take 2 mg by mouth as needed for diarrhea or loose stools. Patient not taking: Reported on 07/29/2019    [provider]  LORazepam (ATIVAN) 0.5 MG tablet Take 1 tablet (0.5 mg total) by mouth 2 (two) times daily as needed for anxiety. 08/13/19   [name], MD  magnesium oxide (MAG-OX) 400 (241.3 Mg) MG tablet Take 1 tablet (400 mg total) by mouth 2 (two) times daily. 08/20/19   [name], MD  nystatin (MYCOSTATIN/NYSTOP) powder Apply 1 application topically 3 (three) times daily. 05/20/19   Owens Shark, NP  prochlorperazine (COMPAZINE) 10 MG tablet Take 1 tablet (10 mg total) by mouth every 6 (six) hours as needed for nausea or vomiting. Patient not taking: Reported on 07/29/2019 07/15/19   Owens Shark, NP    Allergies    Adhesive [tape], Clindamycin/lincomycin, Promethazine, Eggs or egg-derived products, Metoclopramide hcl, Oxycodone-acetaminophen, Sulfa antibiotics, Levaquin [levofloxacin], and Penicillins  Review of Systems   Review of  Systems  Constitutional: Positive for fever. Negative for activity change, appetite change and chills.  HENT: Positive for congestion and rhinorrhea.   Respiratory: Positive for cough. Negative for shortness of breath.   Cardiovascular: Negative for chest pain and palpitations.  Gastrointestinal: Negative for diarrhea, nausea and vomiting.  Genitourinary: Negative for difficulty urinating and dysuria.  Musculoskeletal: Negative for arthralgias and back pain.  Skin: Negative for rash and wound.  Neurological: Negative for light-headedness and headaches.    Physical Exam Updated Vital Signs BP 98/69    Pulse 70    Temp 98.1 F (36.7 C) (Oral)    Resp 18    Ht 5\' 6"  (1.676 m)    Wt 64.4 kg    SpO2 100%    BMI 22.92 kg/m   Physical Exam Vitals and nursing note reviewed. Exam conducted with a chaperone present.  Constitutional:      General: She is not in acute distress.    Appearance: Normal appearance.  HENT:     Head: Normocephalic and atraumatic.     Nose: No rhinorrhea.  Eyes:     General:        Right eye: No discharge.        Left eye: No discharge.     Conjunctiva/sclera: Conjunctivae normal.  Cardiovascular:     Rate and Rhythm: Normal rate and regular rhythm.  Pulmonary:     Effort: Pulmonary effort is normal. No respiratory distress.     Breath sounds: Normal breath sounds. No stridor. No wheezing, rhonchi or rales.  Abdominal:     General: Abdomen is flat. There is no distension.     Palpations: Abdomen is soft.     Tenderness: There is no abdominal tenderness.  Musculoskeletal:        General: No tenderness or signs of injury.  Skin:    General: Skin is warm and dry.     Capillary Refill: Capillary refill takes less than 2 seconds.  Neurological:     General: No focal deficit present.     Mental Status: She is alert. Mental status is at baseline.     Motor: No weakness.  Psychiatric:        Mood and Affect:  Mood normal.        Behavior: Behavior normal.      ED Results / Procedures / Treatments   Labs (all labs ordered are listed, but only abnormal results are displayed) Labs Reviewed  GROUP A STREP BY PCR  SARS CORONAVIRUS 2 BY RT PCR (HOSPITAL ORDER, [facility])    EKG None  Radiology DG Chest 2 View  Result Date: 09/10/2019 CLINICAL DATA:  Fever and cough EXAM: CHEST - 2 VIEW COMPARISON:  December 29, 2018, February 27, 2019 FINDINGS: The cardiomediastinal silhouette is normal in contour. LEFT chest port with tip terminating over the superior cavoatrial junction. No pleural effusion. No pneumothorax. No acute pleuroparenchymal abnormality. Visualized abdomen is unremarkable. No acute osseous abnormality noted. IMPRESSION: Clear lungs. Electronically Signed   By: Valentino Saxon MD   On: 09/10/2019 11:11    Procedures Procedures (including critical care time)  Medications Ordered in ED Medications  magnesium sulfate IVPB 2 g 50 mL (2 g Intravenous New Bag/Given (Non-Interop) 09/10/19 1138)    ED Course  I have reviewed the triage vital signs and the nursing notes.  Pertinent labs & imaging results that were available during my care of the patient were reviewed by me and considered in my medical decision making (see chart for details).    MDM Rules/Calculators/A&P                         Patient currently undergoing chemotherapy for disease, was scheduled to get infusion today, was febrile and having cough congestion for the last 24 hours.  No known sick contacts they sent her here for Covid testing magnesium infusion chest x-ray.  She was prescribed Keflex by this provider.  I have reviewed the labs obtained today and she is not neutropenic.  She has no kidney dysfunction.  Her magnesium is low.  She will be given IV magnesium she will get a chest x-ray she will get a Covid swab.  She does not need IV antibiotics this time she is well-appearing and no signs of sepsis or deep space bacterial infection.   Patient agrees to this plan.  Chest x-rays reviewed by radiology shows no acute cardiopulmonary pathology, I reviewed these images as well and agree.  Strep screen is negative.  Covid test is still pending.  IV magnesium was given.  The patient tolerated therapy and evaluation well.  She feels comfortable for discharge.  Strict return precautions are given and she will follow up with her hematology oncology providers.  Final Clinical Impression(s) / ED Diagnoses Final diagnoses:  Fever in adult  Viral upper respiratory tract infection    Rx / DC Orders ED Discharge Orders    None       Breck Coons, MD 09/10/19 1240

## 2019-09-11 ENCOUNTER — Other Ambulatory Visit: Payer: Self-pay

## 2019-09-11 ENCOUNTER — Other Ambulatory Visit: Payer: Self-pay | Admitting: Adult Health

## 2019-09-11 DIAGNOSIS — U071 COVID-19: Secondary | ICD-10-CM

## 2019-09-11 MED ORDER — LEVETIRACETAM 500 MG PO TABS: 500.0000 mg | ORAL_TABLET | Freq: Two times a day (BID) | ORAL | 1 refills | Status: DC

## 2019-09-11 MED ORDER — SODIUM CHLORIDE 0.9 % IV SOLN
1200.0000 mg | Freq: Once | INTRAVENOUS | Status: AC
  Administered 2019-09-12: 1200 mg via INTRAVENOUS
  Filled 2019-09-11: qty 1200

## 2019-09-11 NOTE — Progress Notes (Signed)
I connected by phone with Mayer Masker on 09/11/2019 at 9:04 AM to discuss the potential use of a new treatment for mild to moderate COVID-19 viral infection in non-hospitalized patients.  This patient is a 44 y.o. female that meets the FDA criteria for Emergency Use Authorization of COVID monoclonal antibody casirivimab/imdevimab.  Has a (+) direct SARS-CoV-2 viral test result  Has mild or moderate COVID-19   Is NOT hospitalized due to COVID-19  Is within 10 days of symptom onset  Has at least one of the high risk factor(s) for progression to severe COVID-19 and/or hospitalization as defined in EUA.  Specific high risk criteria : Immunosuppressive Disease or Treatment   I have spoken and communicated the following to the patient or parent/caregiver regarding COVID monoclonal antibody treatment:  1. FDA has authorized the emergency use for the treatment of mild to moderate COVID-19 in adults and pediatric patients with positive results of direct SARS-CoV-2 viral testing who are 30 years of age and older weighing at least 40 kg, and who are at high risk for progressing to severe COVID-19 and/or hospitalization.  2. The significant known and potential risks and benefits of COVID monoclonal antibody, and the extent to which such potential risks and benefits are unknown.  3. Information on available alternative treatments and the risks and benefits of those alternatives, including clinical trials.  4. Patients treated with COVID monoclonal antibody should continue to self-isolate and use infection control measures (e.g., wear mask, isolate, social distance, avoid sharing personal items, clean and disinfect "high touch" surfaces, and frequent handwashing) according to CDC guidelines.   5. The patient or parent/caregiver has the option to accept or refuse COVID monoclonal antibody treatment.  After reviewing this information with the patient, The patient agreed to proceed with receiving  casirivimab\imdevimab infusion and will be provided a copy of the Fact sheet prior to receiving the infusion. Scot Dock 09/11/2019 9:04 AM

## 2019-09-12 ENCOUNTER — Ambulatory Visit (HOSPITAL_COMMUNITY)
Admission: RE | Admit: 2019-09-12 | Discharge: 2019-09-12 | Disposition: A | Discharge status: Home / Self Care | Payer: BC Managed Care – PPO | Source: Ambulatory Visit | Attending: Pulmonary Disease | Admitting: Pulmonary Disease

## 2019-09-12 ENCOUNTER — Ambulatory Visit: Payer: BC Managed Care – PPO

## 2019-09-12 DIAGNOSIS — U071 COVID-19: Secondary | ICD-10-CM | POA: Diagnosis not present

## 2019-09-12 MED ORDER — HEPARIN SOD (PORK) LOCK FLUSH 100 UNIT/ML IV SOLN
500.0000 [IU] | INTRAVENOUS | Status: AC | PRN
  Administered 2019-09-12: 500 [IU]

## 2019-09-12 MED ORDER — SODIUM CHLORIDE 0.9 % IV SOLN: INTRAVENOUS | Status: DC | PRN

## 2019-09-12 MED ORDER — DIPHENHYDRAMINE HCL 50 MG/ML IJ SOLN: 50.0000 mg | Freq: Once | INTRAMUSCULAR | Status: DC | PRN

## 2019-09-12 MED ORDER — METHYLPREDNISOLONE SODIUM SUCC 125 MG IJ SOLR: 125.0000 mg | Freq: Once | INTRAMUSCULAR | Status: DC | PRN

## 2019-09-12 MED ORDER — ALBUTEROL SULFATE HFA 108 (90 BASE) MCG/ACT IN AERS: 2.0000 | INHALATION_SPRAY | Freq: Once | RESPIRATORY_TRACT | Status: DC | PRN

## 2019-09-12 MED ORDER — EPINEPHRINE 0.3 MG/0.3ML IJ SOAJ: 0.3000 mg | Freq: Once | INTRAMUSCULAR | Status: DC | PRN

## 2019-09-12 MED ORDER — FAMOTIDINE IN NACL 20-0.9 MG/50ML-% IV SOLN: 20.0000 mg | Freq: Once | INTRAVENOUS | Status: DC | PRN

## 2019-09-12 NOTE — Discharge Instructions (Signed)

## 2019-09-12 NOTE — Progress Notes (Signed)
  Diagnosis: COVID-19  Physician: Dr. Joya Gaskins   Procedure: Covid Infusion Clinic Med: casirivimab\imdevimab infusion - Provided patient with casirivimab\imdevimab fact sheet for patients, parents and caregivers prior to infusion.  Complications: No immediate complications noted.  Discharge: Discharged home   Claudia Desanctis 09/12/2019

## 2019-09-14 ENCOUNTER — Telehealth: Payer: Self-pay | Admitting: Nurse Practitioner

## 2019-09-14 ENCOUNTER — Encounter: Payer: Self-pay | Admitting: Oncology

## 2019-09-14 NOTE — Telephone Encounter (Signed)
Rescheduled appointments per 8/9 scheduling message. Patient is aware of updated appointments dates and times.

## 2019-09-14 NOTE — Telephone Encounter (Signed)
No 8/5 los, no changes made to pt schedule

## 2019-09-17 ENCOUNTER — Other Ambulatory Visit: Payer: Self-pay

## 2019-09-17 ENCOUNTER — Encounter (HOSPITAL_COMMUNITY): Payer: Self-pay

## 2019-09-17 ENCOUNTER — Observation Stay (HOSPITAL_COMMUNITY): Payer: BC Managed Care – PPO

## 2019-09-17 ENCOUNTER — Emergency Department (HOSPITAL_COMMUNITY): Payer: BC Managed Care – PPO

## 2019-09-17 ENCOUNTER — Inpatient Hospital Stay (HOSPITAL_COMMUNITY)
Admission: EM | Admit: 2019-09-17 | Discharge: 2019-09-19 | DRG: 100 | Disposition: A | Discharge status: Home / Self Care | Payer: BC Managed Care – PPO | Attending: Internal Medicine | Admitting: Internal Medicine

## 2019-09-17 DIAGNOSIS — C189 Malignant neoplasm of colon, unspecified: Secondary | ICD-10-CM | POA: Diagnosis present

## 2019-09-17 DIAGNOSIS — C7931 Secondary malignant neoplasm of brain: Secondary | ICD-10-CM | POA: Diagnosis not present

## 2019-09-17 DIAGNOSIS — Z880 Allergy status to penicillin: Secondary | ICD-10-CM

## 2019-09-17 DIAGNOSIS — J3489 Other specified disorders of nose and nasal sinuses: Secondary | ICD-10-CM | POA: Diagnosis not present

## 2019-09-17 DIAGNOSIS — F329 Major depressive disorder, single episode, unspecified: Secondary | ICD-10-CM | POA: Diagnosis not present

## 2019-09-17 DIAGNOSIS — G40101 Localization-related (focal) (partial) symptomatic epilepsy and epileptic syndromes with simple partial seizures, not intractable, with status epilepticus: Principal | ICD-10-CM | POA: Diagnosis present

## 2019-09-17 DIAGNOSIS — Z800 Family history of malignant neoplasm of digestive organs: Secondary | ICD-10-CM

## 2019-09-17 DIAGNOSIS — R569 Unspecified convulsions: Secondary | ICD-10-CM

## 2019-09-17 DIAGNOSIS — G9341 Metabolic encephalopathy: Secondary | ICD-10-CM | POA: Diagnosis not present

## 2019-09-17 DIAGNOSIS — J323 Chronic sphenoidal sinusitis: Secondary | ICD-10-CM | POA: Diagnosis not present

## 2019-09-17 DIAGNOSIS — D6481 Anemia due to antineoplastic chemotherapy: Secondary | ICD-10-CM | POA: Diagnosis not present

## 2019-09-17 DIAGNOSIS — Z85118 Personal history of other malignant neoplasm of bronchus and lung: Secondary | ICD-10-CM | POA: Diagnosis not present

## 2019-09-17 DIAGNOSIS — C787 Secondary malignant neoplasm of liver and intrahepatic bile duct: Secondary | ICD-10-CM

## 2019-09-17 DIAGNOSIS — F419 Anxiety disorder, unspecified: Secondary | ICD-10-CM | POA: Diagnosis present

## 2019-09-17 DIAGNOSIS — E785 Hyperlipidemia, unspecified: Secondary | ICD-10-CM | POA: Diagnosis present

## 2019-09-17 DIAGNOSIS — D696 Thrombocytopenia, unspecified: Secondary | ICD-10-CM | POA: Diagnosis not present

## 2019-09-17 DIAGNOSIS — C7951 Secondary malignant neoplasm of bone: Secondary | ICD-10-CM | POA: Diagnosis present

## 2019-09-17 DIAGNOSIS — Z91048 Other nonmedicinal substance allergy status: Secondary | ICD-10-CM

## 2019-09-17 DIAGNOSIS — R413 Other amnesia: Secondary | ICD-10-CM | POA: Diagnosis not present

## 2019-09-17 DIAGNOSIS — Z91012 Allergy to eggs: Secondary | ICD-10-CM

## 2019-09-17 DIAGNOSIS — R7402 Elevation of levels of lactic acid dehydrogenase (LDH): Secondary | ICD-10-CM

## 2019-09-17 DIAGNOSIS — Z881 Allergy status to other antibiotic agents status: Secondary | ICD-10-CM | POA: Diagnosis not present

## 2019-09-17 DIAGNOSIS — Z8543 Personal history of malignant neoplasm of ovary: Secondary | ICD-10-CM

## 2019-09-17 DIAGNOSIS — Z515 Encounter for palliative care: Secondary | ICD-10-CM | POA: Diagnosis not present

## 2019-09-17 DIAGNOSIS — F988 Other specified behavioral and emotional disorders with onset usually occurring in childhood and adolescence: Secondary | ICD-10-CM | POA: Diagnosis not present

## 2019-09-17 DIAGNOSIS — R7401 Elevation of levels of liver transaminase levels: Secondary | ICD-10-CM | POA: Diagnosis not present

## 2019-09-17 DIAGNOSIS — Z801 Family history of malignant neoplasm of trachea, bronchus and lung: Secondary | ICD-10-CM

## 2019-09-17 DIAGNOSIS — K219 Gastro-esophageal reflux disease without esophagitis: Secondary | ICD-10-CM | POA: Diagnosis not present

## 2019-09-17 DIAGNOSIS — C182 Malignant neoplasm of ascending colon: Secondary | ICD-10-CM

## 2019-09-17 DIAGNOSIS — E870 Hyperosmolality and hypernatremia: Secondary | ICD-10-CM | POA: Diagnosis not present

## 2019-09-17 DIAGNOSIS — Z9851 Tubal ligation status: Secondary | ICD-10-CM | POA: Diagnosis not present

## 2019-09-17 DIAGNOSIS — R410 Disorientation, unspecified: Secondary | ICD-10-CM | POA: Diagnosis not present

## 2019-09-17 DIAGNOSIS — G2581 Restless legs syndrome: Secondary | ICD-10-CM | POA: Diagnosis present

## 2019-09-17 DIAGNOSIS — Z85038 Personal history of other malignant neoplasm of large intestine: Secondary | ICD-10-CM | POA: Diagnosis not present

## 2019-09-17 DIAGNOSIS — D849 Immunodeficiency, unspecified: Secondary | ICD-10-CM | POA: Diagnosis not present

## 2019-09-17 DIAGNOSIS — Z7189 Other specified counseling: Secondary | ICD-10-CM

## 2019-09-17 DIAGNOSIS — J322 Chronic ethmoidal sinusitis: Secondary | ICD-10-CM | POA: Diagnosis not present

## 2019-09-17 DIAGNOSIS — Z79899 Other long term (current) drug therapy: Secondary | ICD-10-CM | POA: Diagnosis not present

## 2019-09-17 DIAGNOSIS — Z9221 Personal history of antineoplastic chemotherapy: Secondary | ICD-10-CM

## 2019-09-17 DIAGNOSIS — E876 Hypokalemia: Secondary | ICD-10-CM | POA: Diagnosis not present

## 2019-09-17 DIAGNOSIS — U071 COVID-19: Secondary | ICD-10-CM | POA: Diagnosis not present

## 2019-09-17 DIAGNOSIS — G40909 Epilepsy, unspecified, not intractable, without status epilepticus: Secondary | ICD-10-CM | POA: Diagnosis not present

## 2019-09-17 DIAGNOSIS — T451X5A Adverse effect of antineoplastic and immunosuppressive drugs, initial encounter: Secondary | ICD-10-CM

## 2019-09-17 DIAGNOSIS — Z885 Allergy status to narcotic agent status: Secondary | ICD-10-CM

## 2019-09-17 DIAGNOSIS — Z882 Allergy status to sulfonamides status: Secondary | ICD-10-CM

## 2019-09-17 DIAGNOSIS — Z20822 Contact with and (suspected) exposure to covid-19: Secondary | ICD-10-CM | POA: Diagnosis present

## 2019-09-17 LAB — COMPREHENSIVE METABOLIC PANEL
ALT: 49 U/L — ABNORMAL HIGH (ref 0–44)
AST: 63 U/L — ABNORMAL HIGH (ref 15–41)
Albumin: 4.2 g/dL (ref 3.5–5.0)
Alkaline Phosphatase: 107 U/L (ref 38–126)
BUN: 13 mg/dL (ref 6–20)
CO2: 20 mmol/L — ABNORMAL LOW (ref 22–32)
Calcium: 9.1 mg/dL (ref 8.9–10.3)
Chloride: 107 mmol/L (ref 98–111)
Creatinine, Ser: 0.72 mg/dL (ref 0.44–1.00)
GFR calc Af Amer: >60 mL/min (ref >60)
GFR calc non Af Amer: >60 mL/min (ref >60)
Glucose, Bld: 183 mg/dL — ABNORMAL HIGH (ref 70–99)
Potassium: 3.5 mmol/L (ref 3.5–5.1)
Sodium: 148 mmol/L — ABNORMAL HIGH (ref 135–145)
Total Bilirubin: 0.6 mg/dL (ref 0.3–1.2)
Total Protein: 7.8 g/dL (ref 6.5–8.1)

## 2019-09-17 LAB — CBC WITH DIFFERENTIAL/PLATELET
Abs Immature Granulocytes: 0.09 10*3/uL — ABNORMAL HIGH (ref 0.00–0.07)
Basophils Absolute: 0 10*3/uL (ref 0.0–0.1)
Basophils Relative: 0 %
Eosinophils Absolute: 0 10*3/uL (ref 0.0–0.5)
Eosinophils Relative: 0 %
HCT: 46.2 % — ABNORMAL HIGH (ref 36.0–46.0)
Hemoglobin: 13.2 g/dL (ref 12.0–15.0)
Immature Granulocytes: 1 %
Lymphocytes Relative: 11 %
Lymphs Abs: 1.4 10*3/uL (ref 0.7–4.0)
MCH: 24.9 pg — ABNORMAL LOW (ref 26.0–34.0)
MCHC: 28.6 g/dL — ABNORMAL LOW (ref 30.0–36.0)
MCV: 87.2 fL (ref 80.0–100.0)
Monocytes Absolute: 0.4 10*3/uL (ref 0.1–1.0)
Monocytes Relative: 4 %
Neutro Abs: 10.2 10*3/uL — ABNORMAL HIGH (ref 1.7–7.7)
Neutrophils Relative %: 84 %
Platelets: 222 10*3/uL (ref 150–400)
RBC: 5.3 MIL/uL — ABNORMAL HIGH (ref 3.87–5.11)
RDW: 17.4 % — ABNORMAL HIGH (ref 11.5–15.5)
WBC: 12.1 10*3/uL — ABNORMAL HIGH (ref 4.0–10.5)
nRBC: 0 % (ref 0.0–0.2)

## 2019-09-17 LAB — RAPID URINE DRUG SCREEN, HOSP PERFORMED
Amphetamines: NOT DETECTED
Barbiturates: NOT DETECTED
Benzodiazepines: POSITIVE — AB
Cocaine: NOT DETECTED
Opiates: NOT DETECTED
Tetrahydrocannabinol: NOT DETECTED

## 2019-09-17 LAB — URINALYSIS, ROUTINE W REFLEX MICROSCOPIC
Bacteria, UA: NONE SEEN
Bilirubin Urine: NEGATIVE
Glucose, UA: NEGATIVE mg/dL
Hgb urine dipstick: NEGATIVE
Ketones, ur: NEGATIVE mg/dL
Leukocytes,Ua: NEGATIVE
Nitrite: NEGATIVE
Protein, ur: 30 mg/dL — AB
Specific Gravity, Urine: 1.019 (ref 1.005–1.030)
pH: 7 (ref 5.0–8.0)

## 2019-09-17 LAB — MAGNESIUM: Magnesium: 1.4 mg/dL — ABNORMAL LOW (ref 1.7–2.4)

## 2019-09-17 LAB — CBG MONITORING, ED: Glucose-Capillary: 140 mg/dL — ABNORMAL HIGH (ref 70–99)

## 2019-09-17 MED ORDER — LORAZEPAM 2 MG/ML IJ SOLN
2.0000 mg | Freq: Once | INTRAMUSCULAR | Status: AC
  Filled 2019-09-17: qty 1

## 2019-09-17 MED ORDER — LEVETIRACETAM IN NACL 1000 MG/100ML IV SOLN
1000.0000 mg | INTRAVENOUS | Status: AC
  Administered 2019-09-17 (×4): 1000 mg via INTRAVENOUS
  Filled 2019-09-17 (×3): qty 100

## 2019-09-17 MED ORDER — DIAZEPAM 5 MG/ML IJ SOLN
INTRAMUSCULAR | Status: AC
  Filled 2019-09-17: qty 2

## 2019-09-17 MED ORDER — ENOXAPARIN SODIUM 40 MG/0.4ML ~~LOC~~ SOLN
40.0000 mg | SUBCUTANEOUS | Status: DC
  Administered 2019-09-17 – 2019-09-18 (×2): 40 mg via SUBCUTANEOUS
  Filled 2019-09-17 (×2): qty 0.4

## 2019-09-17 MED ORDER — ONDANSETRON HCL 4 MG/2ML IJ SOLN: 4.0000 mg | Freq: Four times a day (QID) | INTRAMUSCULAR | Status: DC | PRN

## 2019-09-17 MED ORDER — POTASSIUM CHLORIDE IN NACL 20-0.45 MEQ/L-% IV SOLN
INTRAVENOUS | Status: AC
  Filled 2019-09-17 (×3): qty 1000

## 2019-09-17 MED ORDER — MAGNESIUM OXIDE 400 (241.3 MG) MG PO TABS
400.0000 mg | ORAL_TABLET | Freq: Two times a day (BID) | ORAL | Status: DC
  Administered 2019-09-17 – 2019-09-19 (×4): 400 mg via ORAL
  Filled 2019-09-17 (×4): qty 1

## 2019-09-17 MED ORDER — ONDANSETRON HCL 4 MG PO TABS: 4.0000 mg | ORAL_TABLET | Freq: Four times a day (QID) | ORAL | Status: DC | PRN

## 2019-09-17 MED ORDER — MAGNESIUM SULFATE 2 GM/50ML IV SOLN
2.0000 g | Freq: Once | INTRAVENOUS | Status: AC
  Administered 2019-09-17: 2 g via INTRAVENOUS
  Filled 2019-09-17: qty 50

## 2019-09-17 MED ORDER — LEVETIRACETAM IN NACL 1000 MG/100ML IV SOLN
1000.0000 mg | Freq: Two times a day (BID) | INTRAVENOUS | Status: DC
  Administered 2019-09-17 – 2019-09-18 (×2): 1000 mg via INTRAVENOUS
  Filled 2019-09-17 (×2): qty 100

## 2019-09-17 MED ORDER — ACETAMINOPHEN 325 MG PO TABS
650.0000 mg | ORAL_TABLET | Freq: Four times a day (QID) | ORAL | Status: DC | PRN
  Administered 2019-09-18 – 2019-09-19 (×2): 650 mg via ORAL
  Filled 2019-09-17 (×2): qty 2

## 2019-09-17 MED ORDER — ACETAMINOPHEN 650 MG RE SUPP: 650.0000 mg | Freq: Four times a day (QID) | RECTAL | Status: DC | PRN

## 2019-09-17 MED ORDER — LORAZEPAM 2 MG/ML IJ SOLN
INTRAMUSCULAR | Status: AC
  Administered 2019-09-17: 2 mg
  Filled 2019-09-17: qty 1

## 2019-09-17 MED ORDER — SODIUM CHLORIDE 0.9 % IV SOLN: 60.0000 mg/kg | Freq: Once | INTRAVENOUS | Status: DC

## 2019-09-17 NOTE — Consult Note (Signed)
Consultation Note Date: 09/17/2019   Patient Name: [name]  DOB: 08-24-1975  MRN: [id number]  Age / Sex: 44 y.o., female  PCP: [name], MD Referring Physician: Maudie Flakes, MD  Reason for Consultation: Establishing goals of care  HPI/Patient Profile: 44 y.o. female  with past medical history of stage IV colon cancer mets to liver and peritoneal dissemination (follows with Dr. 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 She is COVID positive 09/10/19. Had witnessed seizure in ED and currently postictal. Seizure noted to be from missed medication, febrile, brain mets (although no brain mets on brain MRI 09/03/19), meningitis, hypomagnesemia (magnesium has been low since March 2021), drug use. She is hemodynamically stable and resting comfortably at current time.   Clinical Assessment and Goals of Care: I went to ED today and Lakyla is sleeping comfortably and in postictal state. Hemodynamically stable. Discussed with RN who reports agitation and confusion prior to postictal state. No family at bedside. She is not able to participate in goals of care discussion at this time.   I called and spoke to her husband, Gwyndolyn Saxon, and then spoke with mother, Janae Bridgeman. I discussed with them both plan for admission to better control seizures and support her from her COVID infection. We discussed that she is at higher risk for more serious complications from [place] given her underlying cancer and immunocompromised state. However, she does appear to be stable from [place] currently and main issue is her seizure activity. Gwyndolyn Saxon does believe  that she missed a dose of her seizure medication the night prior to admission. They both question if her infusion 09/12/19 for COVID could have triggered seizure. I do feel this is unlikely and more likely from missed medication in fragile state with underlying infectious process Gwyndolyn Saxon shares that she had not been febrile since 09/10/19 and was checking her temperature regularly). She had headache and fatigue but no other symptoms from COVID infection per Gwyndolyn Saxon.   The majority of our conversation was clarifying where she stands with her cancer and the intention of treatment being palliative. Both Gwyndolyn Saxon and Arlene did not seem aware that she had stage IV advanced colon cancer and that this is not considered a curable illness. They are unsure if Shoshana understands this either as they feel she would have shared this with them. Due to COVID they have been unable to go into her appointments with her so they only know what Arianni has shared. They both express some frustration with conflicting information from doctors and overall confusion. I tell them that Dr. Benay Spice is the expert in oncology and who they ultimately should trust with information regarding her cancer diagnosis, progress, and expectations. I told them that I would reach out to Dr. Benay Spice to see if husband and mother may be  allowed for a one time visit to get the confusion cleared up and that I can also set them up with my colleagues that are helping with these conversations at the cancer center. They feel that this would be helpful. In the meantime they are hopeful that seizures can be better controlled, support her through COVID infection, and ultimately return to her chemotherapy treatments with Dr. Benay Spice. She is young with reportedly good functional status so I am hopeful that this will help her overcome these acute barriers.   All questions/concerns addressed. Emotional support provided.   Primary Decision Maker NEXT OF KIN husband and  mother work together (patient is decisional when not in postictal state)    SUMMARY OF RECOMMENDATIONS   - Will need ongoing discussions and education  Code Status/Advance Care Planning:  Full code   Symptom Management:   Per attending and neurology.   Palliative Prophylaxis:   Aspiration, Bowel Regimen, Delirium Protocol, Frequent Pain Assessment, Oral Care and Turn Reposition  Additional Recommendations (Limitations, Scope, Preferences):  Full Scope Treatment  Psycho-social/Spiritual:   Desire for further Chaplaincy support:no  Additional Recommendations: Caregiving  Support/Resources  Prognosis:   To be determined.   Discharge Planning: To Be Determined      Primary Diagnoses: Present on Admission: **None**   I have reviewed the medical record, interviewed the patient and family, and examined the patient. The following aspects are pertinent.  Past Medical History:  Diagnosis Date  . ADD (attention deficit disorder)   . Anemia, iron deficiency   . Anxiety   . Colon cancer ([place]) 08/2013  . Depression   . Difficulty swallowing pills   . GERD (gastroesophageal reflux disease)   . Headache    migraines "long time ago"  . History of blood transfusion 08/16/2013  . History of migraine    no problems in "a long time"  . 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 Restless leg syndrome   . Seizures ([place])    last seizure 08/2012   Social History   Socioeconomic History  . Marital status: Married    Spouse name: Not on file  . Number of children: 2  . Years of education: Not on file  . Highest education level: Not on file  Occupational History  . Occupation: Unemployed- full time student    Comment: Completed 12th grade  Tobacco Use  . Smoking status: Never Smoker  . Smokeless tobacco: Never Used  Vaping Use  . Vaping Use: Never used  Substance and Sexual Activity  . Alcohol use: No  . Drug use: No  . Sexual activity: Yes    Birth  control/protection: Surgical  Other Topics Concern  . Not on file  Social History Narrative  . Not on file   Social Determinants of Health   Financial Resource Strain:   . Difficulty of Paying Living Expenses:   Food Insecurity:   . Worried About Charity fundraiser in the Last Year:   . Arboriculturist in the Last Year:   Transportation Needs:   . Film/video editor (Medical):   Marland Kitchen Lack of Transportation (Non-Medical):   Physical Activity:   . Days of Exercise per Week:   . Minutes of Exercise per Session:   Stress:   . Feeling of Stress :   Social Connections:   . Frequency of Communication with Friends and Family:   . Frequency of Social Gatherings with Friends and Family:   . Attends Religious Services:   .  Active Member of Clubs or Organizations:   . Attends Archivist Meetings:   Marland Kitchen Marital Status:    Family History  Problem Relation Age of Onset  . Colon polyps Mother        3+ colon polyps at each colonoscopy - "several"  . Colitis Mother   . Heart attack Mother   . Diabetes Mother   . Heart disease Mother   . Diabetes Father   . Heart attack Father   . Stroke Father   . Congestive Heart Failure Father   . Heart disease Father   . Cirrhosis Maternal Aunt   . Lung cancer Maternal Uncle        d. 3s; smoker and worked around asbestos  . Other Paternal Grandfather        "bone cancer"  . Colon cancer Maternal Uncle        dx early 29s  . Breast cancer Maternal Aunt        dx 50s-60s; s/p lumpectomy  . Dementia Maternal Aunt   . Other Cousin        maternal 1st cousin w/ "lung issues"  . Dementia Paternal Aunt   . Dementia Paternal Uncle   . Prostate cancer Paternal Uncle 55  . Esophageal cancer Neg Hx   . Rectal cancer Neg Hx   . Stomach cancer Neg Hx    Scheduled Meds: . diazepam       Continuous Infusions: PRN Meds:. Allergies  Allergen Reactions  . Adhesive [Tape] Hives, Shortness Of Breath and Swelling    Tolerates paper tape    . Clindamycin/Lincomycin Itching and Rash    Generalized body rash with itching, lip ulcers  . Promethazine Other (See Comments)    Increase restless leg movement   . Eggs Or Egg-Derived Products Nausea And Vomiting  . Metoclopramide Hcl Other (See Comments)    EXACERBATES RESTLESS LEG SYNDROME  . Oxycodone-Acetaminophen Hives  . Sulfa Antibiotics Nausea And Vomiting  . Levaquin [Levofloxacin]     seizures  . Penicillins Itching    Has patient had a PCN reaction causing immediate rash, facial/tongue/throat swelling, SOB or lightheadedness with hypotension: No Has patient had a PCN reaction causing severe rash involving mucus membranes or skin necrosis: No Has patient had a PCN reaction that required hospitalization No Has patient had a PCN reaction occurring within the last 10 years: No If all of the above answers are "NO", then may proceed with Cephalosporin use.    Review of Systems  Unable to perform ROS: Mental status change    Physical Exam  Vital Signs: BP 106/69   Pulse 76   Temp 98.4 F (36.9 C) (Oral)   Resp (!) 22   Ht 5\' 6"  (1.676 m)   Wt 64.4 kg   SpO2 99%   BMI 22.92 kg/m  Pain Scale: 0-10   Pain Score: 0-No pain   SpO2: SpO2: 99 % O2 Device:SpO2: 99 % O2 Flow Rate: .   IO: Intake/output summary: No intake or output data in the 24 hours ending 09/17/19 1520  LBM:   Baseline Weight: Weight: 64.4 kg Most recent weight: Weight: 64.4 kg     Palliative Assessment/Data:     Time In: 1545 Time Out: 1645 Time Total: 60 min Greater than 50%  of this time was spent counseling and coordinating care related to the above assessment and plan.  Signed by: Vinie Sill, NP Palliative Medicine Team Pager # [phone] (M-F 8a-5p) Team Phone #  [phone] (Nights/Weekends)   The above conversation was completed via telephone due to the visitor restrictions during the COVID-19 pandemic. Thorough chart review and discussion with necessary members of the  care team was completed as part of assessment. All issues were discussed and addressed but no physical exam was performed.

## 2019-09-17 NOTE — ED Notes (Signed)
Dr. Merlene Laughter at bedside

## 2019-09-17 NOTE — ED Notes (Signed)
Pt started seizing for around 2 minutes. Dr Sedonia Small aware. IV established. Given 2mg  Lorazepam. Pt now postictal.

## 2019-09-17 NOTE — ED Notes (Addendum)
Pt to MRI

## 2019-09-17 NOTE — Consult Note (Signed)
Douglas A. Merlene Laughter, MD     [email or web address]          [name] is an 44 y.o. female.   ASSESSMENT/PLAN: 1. Breakthrough seizures with patient having series of back-to-back seizures likely meeting the criteria for status epilepticus now resolved. The patient's baseline maintenance dose of Keppra will be increased to 1000 mg twice a day. An EEG will be obtained. 2. 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 3. COVID-19 infection 4. Metastatic colon cancer     The patient is a 44 year old white female who has a baseline history of epilepsy. From what I can ascertain, she has had epilepsy for a few years dating back to at least 2014. Last seizure was December 2020. She has been on Keppra at least recently 500 mg twice a day. She was found to be confused and encephalopathic with what appears to be oral trauma suspicious for seizure activity. She had a clear tonic-clonic event in the emergency room. She has been loaded with Keppra and given benzodiazepines. She has been quite stuporous since then. The encephalopathy has improved to the point that she answers simple questions. She reports being compliant with her seizure medications. She does not report having focal neurological deficit at this time. She reports being fatigued tired and having diffuse pain. She denies headaches however. She has been diagnosed with COVID-19 infection recently. No reports of dyspnea or chest pain or palpitations. The review of systems otherwise unrevealing.    GENERAL: This is a drowsy female who is resting well but in no acute distress.  HEENT: There is bruises and some superficial laceration left facial region. Neck is supple.  ABDOMEN: soft  EXTREMITIES: No edema   BACK: Normal  SKIN: Normal by inspection.    MENTAL STATUS: She lays in bed with eyes closed. She does open her eyes to verbal commands. She follows simple  commands. She knows to the hospital but unaware as to why she is here. She seems amnestic for her current medical situation.  CRANIAL NERVES: Pupils are equal, round and reactive to light and accomodation; extra ocular movements are full, there is no significant nystagmus; visual fields are full; upper and lower facial muscles are normal in strength and symmetric, there is no flattening of the nasolabial folds; tongue is midline; uvula is midline  MOTOR: Normal tone, bulk and strength; no pronator drift.  COORDINATION: Left finger to nose is normal, right finger to nose is normal, No rest tremor; no intention tremor; no postural tremor; no bradykinesia.  REFLEXES: Deep tendon reflexes are symmetrical and normal.    SENSATION: Normal to pain.     Brain MRI done today was limited. No contrast was given. No acute changes are noted on DWI. No white matter lesions. No evidence evidence of swelling, edema or encephalomalacia. Scan is unremarkable.       Blood pressure 102/75, pulse 70, temperature 98.4 F (36.9 C), temperature source Oral, resp. rate 18, height 5\' 6"  (1.676 m), weight 64.4 kg, SpO2 99 %.  Past Medical History:  Diagnosis Date  . ADD (attention deficit disorder)   . Anemia, iron deficiency   . Anxiety   . Colon cancer ([place]) 08/2013  . Depression   . Difficulty swallowing pills   . GERD (gastroesophageal reflux disease)   . Headache    migraines "long time ago"  . History of blood transfusion 08/16/2013  . History of migraine    no problems in "a long  time"  . 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 Restless leg syndrome   . Seizures ([place])    last seizure 08/2012    Past Surgical History:  Procedure Laterality Date  . ABDOMINAL HYSTERECTOMY  08/24/2004   partial  . APPENDECTOMY  08/24/2004  . COLON SURGERY  08/11/2013   removed a foot of colon  . COLONOSCOPY  2019  . colonoscopy 10-18-14    . COLONOSCOPY WITH PROPOFOL  07/07/2013  . CYSTOSCOPY   11/01/2003  . ENDOMETRIAL FULGURATION  11/01/2003  . ESOPHAGOGASTRODUODENOSCOPY  07/07/2013  . IR IMAGING GUIDED PORT INSERTION  03/05/2019  . LAPAROSCOPIC LYSIS OF ADHESIONS  11/01/2003  . LAPAROSCOPIC PARTIAL COLECTOMY Right 08/11/2013   Procedure: LAPAROSCOPIC RIGHT HEMICOLECTOMY;  Surgeon: Stark Klein, MD;  Location: [facility];  Service: General;  Laterality: Right;  . LAPAROTOMY N/A 12/28/2014   Procedure: EXPLORATORY LAPAROTOMY;  Surgeon: Everitt Amber, MD;  Location: [facility];  Service: Gynecology;  Laterality: N/A;  . LEFT OOPHORECTOMY  08/24/2004  . PANNICULECTOMY  08/24/2004  . PORT-A-CATH REMOVAL Left 06/08/2014   Procedure: REMOVAL PORT-A-CATH;  Surgeon: Stark Klein, MD;  Location: [facility];  Service: General;  Laterality: Left;  . PORTACATH PLACEMENT Left 09/09/2013   Procedure: INSERTION PORT-A-CATH;  Surgeon: Stark Klein, MD;  Location: [facility];  Service: General;  Laterality: Left;  . SALPINGOOPHORECTOMY Right 12/28/2014   Procedure: RIGHT SALPINGO OOPHORECTOMY;  Surgeon: Everitt Amber, MD;  Location: [facility];  Service: Gynecology;  Laterality: Right;  . TUBAL LIGATION  11/07/2000  . UNILATERAL SALPINGECTOMY Left 08/24/2004  . WISDOM TOOTH EXTRACTION Bilateral 1994    Family History  Problem Relation Age of Onset  . Colon polyps Mother        3+ colon polyps at each colonoscopy - "several"  . Colitis Mother   . Heart attack Mother   . Diabetes Mother   . Heart disease Mother   . Diabetes Father   . Heart attack Father   . Stroke Father   . Congestive Heart Failure Father   . Heart disease Father   . Cirrhosis Maternal Aunt   . Lung cancer Maternal Uncle        d. 78s; smoker and worked around asbestos  . Other Paternal Grandfather        "bone cancer"  . Colon cancer Maternal Uncle        dx early 60s  . Breast cancer Maternal Aunt        dx 50s-60s; s/p lumpectomy  . Dementia Maternal Aunt   . Other Cousin        maternal 1st cousin w/ "lung issues"  . Dementia  Paternal Aunt   . Dementia Paternal Uncle   . Prostate cancer Paternal Uncle 16  . Esophageal cancer Neg Hx   . Rectal cancer Neg Hx   . Stomach cancer Neg Hx     Social History:  reports that she has never smoked. She has never used smokeless tobacco. She reports that she does not drink alcohol and does not use drugs.  Allergies:  Allergies  Allergen Reactions  . Adhesive [Tape] Hives, Shortness Of Breath and Swelling    Tolerates paper tape  . Clindamycin/Lincomycin Itching and Rash    Generalized body rash with itching, lip ulcers  . Promethazine Other (See Comments)    Increase restless leg movement   . Eggs Or Egg-Derived Products Nausea And Vomiting  . Metoclopramide Hcl Other (See Comments)  EXACERBATES RESTLESS LEG SYNDROME  . Oxycodone-Acetaminophen Hives  . Sulfa Antibiotics Nausea And Vomiting  . Levaquin [Levofloxacin]     seizures  . Penicillins Itching    Has patient had a PCN reaction causing immediate rash, facial/tongue/throat swelling, SOB or lightheadedness with hypotension: No Has patient had a PCN reaction causing severe rash involving mucus membranes or skin necrosis: No Has patient had a PCN reaction that required hospitalization No Has patient had a PCN reaction occurring within the last 10 years: No If all of the above answers are "NO", then may proceed with Cephalosporin use.     Medications: Prior to Admission medications   Medication Sig Start Date End Date Taking? Authorizing Provider  levETIRAcetam (KEPPRA) 500 MG tablet Take 1 tablet (500 mg total) by mouth 2 (two) times daily. 09/11/19  Yes Owens Shark, NP  magnesium oxide (MAG-OX) 400 (241.3 Mg) MG tablet Take 1 tablet (400 mg total) by mouth 2 (two) times daily. 08/20/19  Yes [name], MD  acetaminophen (TYLENOL) 500 MG tablet Take 1,000 mg by mouth every 6 (six) hours as needed.    [provider]  alum & mag hydroxide-simeth (MAALOX/MYLANTA) 200-200-20 MG/5ML suspension  Take 30 mLs by mouth every 6 (six) hours as needed for indigestion or heartburn.    [provider]  doxycycline (VIBRA-TABS) 100 MG tablet Take 1 tablet (100 mg total) by mouth 2 (two) times daily. Patient not taking: Reported on 09/17/2019 06/29/19   Owens Shark, NP  lidocaine-prilocaine (EMLA) cream Apply to port site 1-2 hours prior to use Patient not taking: Reported on 09/17/2019 02/23/19   Owens Shark, NP  LORazepam (ATIVAN) 0.5 MG tablet Take 1 tablet (0.5 mg total) by mouth 2 (two) times daily as needed for anxiety. 08/13/19   [name], MD  nystatin (MYCOSTATIN/NYSTOP) powder Apply 1 application topically 3 (three) times daily. Patient not taking: Reported on 09/17/2019 05/20/19   Owens Shark, NP  prochlorperazine (COMPAZINE) 10 MG tablet Take 1 tablet (10 mg total) by mouth every 6 (six) hours as needed for nausea or vomiting. Patient not taking: Reported on 07/29/2019 07/15/19   Owens Shark, NP    Scheduled Meds: . diazepam      . enoxaparin (LOVENOX) injection  40 mg Subcutaneous Q24H  . magnesium oxide  400 mg Oral BID   Continuous Infusions: . 0.45 % NaCl with KCl 20 mEq / L    . levETIRAcetam     PRN Meds:.acetaminophen **OR** acetaminophen, ondansetron **OR** ondansetron (ZOFRAN) IV     Results for orders placed or performed during the hospital encounter of 09/17/19 (from the past 48 hour(s))  CBC with Differential     Status: Abnormal   Collection Time: 09/17/19 11:06 AM  Result Value Ref Range   WBC 12.1 (H) 4.0 - 10.5 K/uL   RBC 5.30 (H) 3.87 - 5.11 MIL/uL   Hemoglobin 13.2 12.0 - 15.0 g/dL   HCT 46.2 (H) 36 - 46 %   MCV 87.2 80.0 - 100.0 fL   MCH 24.9 (L) 26.0 - 34.0 pg   MCHC 28.6 (L) 30.0 - 36.0 g/dL   RDW 17.4 (H) 11.5 - 15.5 %   Platelets 222 150 - 400 K/uL   nRBC 0.0 0.0 - 0.2 %   Neutrophils Relative % 84 %   Neutro Abs 10.2 (H) 1.7 - 7.7 K/uL   Lymphocytes Relative 11 %   Lymphs Abs 1.4 0.7 - 4.0 K/uL  Monocytes Relative 4 %    Monocytes Absolute 0.4 0 - 1 K/uL   Eosinophils Relative 0 %   Eosinophils Absolute 0.0 0 - 0 K/uL   Basophils Relative 0 %   Basophils Absolute 0.0 0 - 0 K/uL   Immature Granulocytes 1 %   Abs Immature Granulocytes 0.09 (H) 0.00 - 0.07 K/uL    Comment: Performed at [facility], [address] [place], [place] [address]  Comprehensive metabolic panel     Status: Abnormal   Collection Time: 09/17/19 11:06 AM  Result Value Ref Range   Sodium 148 (H) 135 - 145 mmol/L   Potassium 3.5 3.5 - 5.1 mmol/L   Chloride 107 98 - 111 mmol/L   CO2 20 (L) 22 - 32 mmol/L   Glucose, Bld 183 (H) 70 - 99 mg/dL    Comment: Glucose reference range applies only to samples taken after fasting for at least 8 hours.   BUN 13 6 - 20 mg/dL   Creatinine, Ser 0.72 0.44 - 1.00 mg/dL   Calcium 9.1 8.9 - 10.3 mg/dL   Total Protein 7.8 6.5 - 8.1 g/dL   Albumin 4.2 3.5 - 5.0 g/dL   AST 63 (H) 15 - 41 U/L   ALT 49 (H) 0 - 44 U/L   Alkaline Phosphatase 107 38 - 126 U/L   Total Bilirubin 0.6 0.3 - 1.2 mg/dL   GFR calc non Af Amer >60 >60 mL/min   GFR calc Af Amer >60 >60 mL/min    Comment: Performed at [facility], [address], [place], [place] [address]  Magnesium     Status: Abnormal   Collection Time: 09/17/19 11:06 AM  Result Value Ref Range   Magnesium 1.4 (L) 1.7 - 2.4 mg/dL    Comment: Performed at [facility], [address] [place], [place] [address]  POC CBG, ED     Status: Abnormal   Collection Time: 09/17/19 12:55 PM  Result Value Ref Range   Glucose-Capillary 140 (H) 70 - 99 mg/dL    Comment: Glucose reference range applies only to samples taken after fasting for at least 8 hours.  Urinalysis, Routine w reflex microscopic Urine, Catheterized     Status: Abnormal   Collection Time: 09/17/19  2:35 PM  Result Value Ref Range   Color, Urine YELLOW YELLOW   APPearance CLEAR CLEAR   Specific Gravity, Urine 1.019 1.005 - 1.030   pH 7.0 5.0 - 8.0   Glucose, UA NEGATIVE NEGATIVE mg/dL   Hgb urine  dipstick NEGATIVE NEGATIVE   Bilirubin Urine NEGATIVE NEGATIVE   Ketones, ur NEGATIVE NEGATIVE mg/dL   Protein, ur 30 (A) NEGATIVE mg/dL   Nitrite NEGATIVE NEGATIVE   Leukocytes,Ua NEGATIVE NEGATIVE   RBC / HPF 0-5 0 - 5 RBC/hpf   WBC, UA 0-5 0 - 5 WBC/hpf   Bacteria, UA NONE SEEN NONE SEEN   Squamous Epithelial / LPF 0-5 0 - 5   Mucus PRESENT    Amorphous Crystal PRESENT     Comment: Performed at [facility], [address], [place],  [address]  Rapid urine drug screen (hospital performed)     Status: Abnormal   Collection Time: 09/17/19  2:35 PM  Result Value Ref Range   Opiates NONE DETECTED NONE DETECTED   Cocaine NONE DETECTED NONE DETECTED   Benzodiazepines POSITIVE (A) NONE DETECTED   Amphetamines NONE DETECTED NONE DETECTED   Tetrahydrocannabinol NONE DETECTED NONE DETECTED   Barbiturates NONE DETECTED NONE DETECTED  Comment: (NOTE) DRUG SCREEN FOR MEDICAL PURPOSES ONLY.  IF CONFIRMATION IS NEEDED FOR ANY PURPOSE, NOTIFY LAB WITHIN 5 DAYS.  LOWEST DETECTABLE LIMITS FOR URINE DRUG SCREEN Drug Class                     Cutoff (ng/mL) Amphetamine and metabolites    1000 Barbiturate and metabolites    200 Benzodiazepine                 887 Tricyclics and metabolites     300 Opiates and metabolites        300 Cocaine and metabolites        300 THC                            50 Performed at [facility] [address], [place], [place] [address]    *Note: Due to a large number of results and/or encounters for the requested time period, some results have not been displayed. A complete set of results can be found in Results Review.    Studies/Results:     Jocelyn Nold A. Merlene Laughter, M.D.  Diplomate, Tax adviser of Psychiatry and Neurology ( Neurology). 09/17/2019, 7:06 PM

## 2019-09-17 NOTE — ED Provider Notes (Signed)
[facility] Provider Note   CSN: [id number] Arrival date & time: 09/17/19  8546     History Chief Complaint  Patient presents with  . Seizures    [name] is a 44 y.o. female.  HPI  LEVEL 5 CAVEAT 2/2 TO POST ICTAL STATE 44 year old female with a complicated medical history including colon cancer with liver metastases, metastatic adenocarcinoma of the ovary, seizures, and recent positive Covid infection as of 1 week ago, presents to the ER after having a presumed seizure.  History provided by the patient's husband who I spoke with over the phone.  He states that this morning he walked in to her room and saw that she was sitting up against the headboard with her head slumped over.  When he walked in, she did awake, but was confused.  Unclear if she took her Keppra yesterday.  He refers that she had chemo on Thursday, and had ran a fever the night prior on Wednesday night.  She was told to go get a Covid test, and tested positive.  Patient does have a history of seizures.     Past Medical History:  Diagnosis Date  . ADD (attention deficit disorder)   . Anemia, iron deficiency   . Anxiety   . Colon cancer ([place]) 08/2013  . Depression   . Difficulty swallowing pills   . GERD (gastroesophageal reflux disease)   . Headache    migraines "long time ago"  . History of blood transfusion 08/16/2013  . History of migraine    no problems in "a long time"  . Metastatic adenocarcinoma of ovary, right ([place]) 12/2014  . Restless leg syndrome   . Seizures ([place])    last seizure 08/2012    Patient Active Problem List   Diagnosis Date Noted  . Port-A-Cath in place 06/04/2019  . Seizure ([place]) 04/10/2019  . Goals of care, counseling/discussion 02/23/2019  . 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 Ileus ([place])   . Metastatic malignant neoplasm ([place])   . Genetic testing 11/13/2015  . Sinusitis 01/29/2015  . Pelvic mass in female 12/28/2014  . Ovarian mass, right   .  Hypokalemia 02/03/2014  . Nausea 12/20/2013  . Fever 12/18/2013  . Dehydration 12/18/2013  . Diarrhea 10/28/2013  . Malfunction of device 10/28/2013  . Weight loss 10/28/2013  . Mucositis (ulcerative) due to antineoplastic therapy 10/28/2013  . Anemia due to antineoplastic chemotherapy 09/25/2013  . Chemotherapy induced thrombocytopenia 09/25/2013  . Hypomagnesemia 09/25/2013  . Hypophosphatemia 09/25/2013  . Adverse drug effect 09/25/2013  . Abdominal pain 09/24/2013  . Cancer of ascending colon s/p right colectomy 08/11/2013 08/05/2013  . Anemia, iron deficiency 01/28/2013  . 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[place]) 11/11/2012  . Discoloration of skin of face 09/03/2012  . Medication reaction 05/03/2012  . Migraine 03/31/2012  . Hyperglycemia 01/18/2012  . Fatigue 12/12/2011  . ADD (attention deficit disorder) 09/23/2010  . Right wrist pain 05/01/2010  . LIBIDO, DECREASED 04/07/2010  . IRON DEFICIENCY 05/17/2009  . ANA POSITIVE 05/17/2009  . OVARIAN CYST 05/10/2009  . FATTY LIVER DISEASE 03/24/2009  . TRANSAMINASES, SERUM, ELEVATED 03/21/2009  . Gastroparesis 08/16/2008  . DEPRESSION/ANXIETY 09/19/2006  . HYPERLIPIDEMIA 08/16/2006  . RESTLESS LEG SYNDROME 08/16/2006  . GERD 08/16/2006  . IBS 08/16/2006    Past Surgical History:  Procedure Laterality Date  . ABDOMINAL HYSTERECTOMY  08/24/2004   partial  . APPENDECTOMY  08/24/2004  . COLON SURGERY  08/11/2013   removed a foot of colon  .  COLONOSCOPY  2019  . colonoscopy 10-18-14    . COLONOSCOPY WITH PROPOFOL  07/07/2013  . CYSTOSCOPY  11/01/2003  . ENDOMETRIAL FULGURATION  11/01/2003  . ESOPHAGOGASTRODUODENOSCOPY  07/07/2013  . IR IMAGING GUIDED PORT INSERTION  03/05/2019  . LAPAROSCOPIC LYSIS OF ADHESIONS  11/01/2003  . LAPAROSCOPIC PARTIAL COLECTOMY Right 08/11/2013   Procedure: LAPAROSCOPIC RIGHT HEMICOLECTOMY;  Surgeon: Stark Klein, MD;  Location: [facility];  Service: General;  Laterality: Right;  . LAPAROTOMY N/A 12/28/2014    Procedure: EXPLORATORY LAPAROTOMY;  Surgeon: Everitt Amber, MD;  Location: [facility];  Service: Gynecology;  Laterality: N/A;  . LEFT OOPHORECTOMY  08/24/2004  . PANNICULECTOMY  08/24/2004  . PORT-A-CATH REMOVAL Left 06/08/2014   Procedure: REMOVAL PORT-A-CATH;  Surgeon: Stark Klein, MD;  Location: [facility];  Service: General;  Laterality: Left;  . PORTACATH PLACEMENT Left 09/09/2013   Procedure: INSERTION PORT-A-CATH;  Surgeon: Stark Klein, MD;  Location: [facility];  Service: General;  Laterality: Left;  . SALPINGOOPHORECTOMY Right 12/28/2014   Procedure: RIGHT SALPINGO OOPHORECTOMY;  Surgeon: Everitt Amber, MD;  Location: [facility];  Service: Gynecology;  Laterality: Right;  . TUBAL LIGATION  11/07/2000  . UNILATERAL SALPINGECTOMY Left 08/24/2004  . WISDOM TOOTH EXTRACTION Bilateral 1994     OB History   No obstetric history on file.     Family History  Problem Relation Age of Onset  . Colon polyps Mother        3+ colon polyps at each colonoscopy - "several"  . Colitis Mother   . Heart attack Mother   . Diabetes Mother   . Heart disease Mother   . Diabetes Father   . Heart attack Father   . Stroke Father   . Congestive Heart Failure Father   . Heart disease Father   . Cirrhosis Maternal Aunt   . Lung cancer Maternal Uncle        d. 36s; smoker and worked around asbestos  . Other Paternal Grandfather        "bone cancer"  . Colon cancer Maternal Uncle        dx early 90s  . Breast cancer Maternal Aunt        dx 50s-60s; s/p lumpectomy  . Dementia Maternal Aunt   . Other Cousin        maternal 1st cousin w/ "lung issues"  . Dementia Paternal Aunt   . Dementia Paternal Uncle   . Prostate cancer Paternal Uncle 48  . Esophageal cancer Neg Hx   . Rectal cancer Neg Hx   . Stomach cancer Neg Hx     Social History   Tobacco Use  . Smoking status: Never Smoker  . Smokeless tobacco: Never Used  Vaping Use  . Vaping Use: Never used  Substance Use Topics  . Alcohol  use: No  . Drug use: No    Home Medications Prior to Admission medications   Medication Sig Start Date End Date Taking? Authorizing Provider  levETIRAcetam (KEPPRA) 500 MG tablet Take 1 tablet (500 mg total) by mouth 2 (two) times daily. 09/11/19  Yes Owens Shark, NP  magnesium oxide (MAG-OX) 400 (241.3 Mg) MG tablet Take 1 tablet (400 mg total) by mouth 2 (two) times daily. 08/20/19  Yes [name], MD  acetaminophen (TYLENOL) 500 MG tablet Take 1,000 mg by mouth every 6 (six) hours as needed.    [provider]  alum & mag hydroxide-simeth (MAALOX/MYLANTA) 200-200-20 MG/5ML suspension Take 30 mLs by mouth  every 6 (six) hours as needed for indigestion or heartburn.    [provider]  doxycycline (VIBRA-TABS) 100 MG tablet Take 1 tablet (100 mg total) by mouth 2 (two) times daily. Patient not taking: Reported on 09/17/2019 06/29/19   Owens Shark, NP  lidocaine-prilocaine (EMLA) cream Apply to port site 1-2 hours prior to use Patient not taking: Reported on 09/17/2019 02/23/19   Owens Shark, NP  LORazepam (ATIVAN) 0.5 MG tablet Take 1 tablet (0.5 mg total) by mouth 2 (two) times daily as needed for anxiety. 08/13/19   [name], MD  nystatin (MYCOSTATIN/NYSTOP) powder Apply 1 application topically 3 (three) times daily. Patient not taking: Reported on 09/17/2019 05/20/19   Owens Shark, NP  prochlorperazine (COMPAZINE) 10 MG tablet Take 1 tablet (10 mg total) by mouth every 6 (six) hours as needed for nausea or vomiting. Patient not taking: Reported on 07/29/2019 07/15/19   Owens Shark, NP    Allergies    Adhesive [tape], Clindamycin/lincomycin, Promethazine, Eggs or egg-derived products, Metoclopramide hcl, Oxycodone-acetaminophen, Sulfa antibiotics, Levaquin [levofloxacin], and Penicillins  Review of Systems   Review of Systems  Unable to perform ROS: Mental status change    Physical Exam Updated Vital Signs BP 106/69   Pulse 76   Temp 98.4 F (36.9  C) (Oral)   Resp (!) 22   Ht 5\' 6"  (1.676 m)   Wt 64.4 kg   SpO2 99%   BMI 22.92 kg/m   Physical Exam Vitals and nursing note reviewed.  Constitutional:      General: She is not in acute distress.    Appearance: Normal appearance. She is well-developed. She is not ill-appearing, toxic-appearing or diaphoretic.     Comments: Patient is alert and oriented x2, slow to respond.  Still appears postictal  HENT:     Head: Normocephalic and atraumatic.     Comments: Superficial scratch starting from her upper left extending diagonally across her left cheek and left eyebrow. No visible eye involvement. No of hemotympanum, raccoon eyes, battle sign.  No mastoid tenderness.  No malocclusion.  No evidence of lacerations, cranial deformities. Full range of motion of head and neck      Mouth/Throat:     Mouth: Mucous membranes are moist.     Pharynx: Oropharynx is clear.     Comments: Bite marks on the lateral side of the tongue Eyes:     Conjunctiva/sclera: Conjunctivae normal.  Cardiovascular:     Rate and Rhythm: Normal rate and regular rhythm.     Pulses: Normal pulses.     Heart sounds: Normal heart sounds. No murmur heard.   Pulmonary:     Effort: Pulmonary effort is normal. No respiratory distress.     Breath sounds: Normal breath sounds.  Abdominal:     Palpations: Abdomen is soft.     Tenderness: There is no abdominal tenderness.  Musculoskeletal:        General: No tenderness or deformity. Normal range of motion.     Cervical back: Normal range of motion and neck supple. No rigidity or tenderness.  Skin:    General: Skin is warm and dry.     Capillary Refill: Capillary refill takes less than 2 seconds.  Neurological:     General: No focal deficit present.     Mental Status: She is alert and oriented to person, place, and time.     Sensory: No sensory deficit.     Motor: No weakness.  Comments: Patient moving all 4 extremities without difficulty.  No focal neuro deficit.      ED Results / Procedures / Treatments   Labs (all labs ordered are listed, but only abnormal results are displayed) Labs Reviewed  CBC WITH DIFFERENTIAL/PLATELET - Abnormal; Notable for the following components:      Result Value   WBC 12.1 (*)    RBC 5.30 (*)    HCT 46.2 (*)    MCH 24.9 (*)    MCHC 28.6 (*)    RDW 17.4 (*)    Neutro Abs 10.2 (*)    Abs Immature Granulocytes 0.09 (*)    All other components within normal limits  COMPREHENSIVE METABOLIC PANEL - Abnormal; Notable for the following components:   Sodium 148 (*)    CO2 20 (*)    Glucose, Bld 183 (*)    AST 63 (*)    ALT 49 (*)    All other components within normal limits  MAGNESIUM - Abnormal; Notable for the following components:   Magnesium 1.4 (*)    All other components within normal limits  RAPID URINE DRUG SCREEN, HOSP PERFORMED - Abnormal; Notable for the following components:   Benzodiazepines POSITIVE (*)    All other components within normal limits  CBG MONITORING, ED - Abnormal; Notable for the following components:   Glucose-Capillary 140 (*)    All other components within normal limits  URINALYSIS, ROUTINE W REFLEX MICROSCOPIC    EKG None  Radiology CT Head Wo Contrast  Result Date: 09/17/2019 CLINICAL DATA:  Seizure today.  COVID positive. EXAM: CT HEAD WITHOUT CONTRAST TECHNIQUE: Contiguous axial images were obtained from the base of the skull through the vertex without intravenous contrast. COMPARISON:  Head CT dated 04/09/2019. FINDINGS: Brain: Ventricles are within normal limits in size. There is no mass, hemorrhage, edema or other evidence of acute parenchymal abnormality. No extra-axial hemorrhage. Vascular: No hyperdense vessel or unexpected calcification. Skull: Normal. Negative for fracture or focal lesion. Sinuses/Orbits: Small amount of layering fluid within the sphenoid sinuses. Mild mucosal thickening within the ethmoid air cells. Visualized periorbital and retro-orbital soft  tissues are unremarkable. Other: None. IMPRESSION: 1. No acute intracranial abnormality. No intracranial mass, hemorrhage or edema. 2. Mild sphenoid and ethmoid sinus disease, of uncertain age. Electronically Signed   By: Franki Cabot M.D.   On: 09/17/2019 12:44    Procedures Procedures (including critical care time) CRITICAL CARE Performed by: Polo Riley   Total critical care time: 45 minutes  Critical care time was exclusive of separately billable procedures and treating other patients.  Critical care was necessary to treat or prevent imminent or life-threatening deterioration.  Critical care was time spent personally by me on the following activities: development of treatment plan with patient and/or surrogate as well as nursing, discussions with consultants, evaluation of patient's response to treatment, examination of patient, obtaining history from patient or surrogate, ordering and performing treatments and interventions, ordering and review of laboratory studies, ordering and review of radiographic studies, pulse oximetry and re-evaluation of patient's condition.  Medications Ordered in ED Medications  diazepam (VALIUM) 5 MG/ML injection (  Not Given 09/17/19 1107)  levETIRAcetam (KEPPRA) IVPB 1000 mg/100 mL premix (0 mg Intravenous Stopped 09/17/19 1425)  LORazepam (ATIVAN) injection 2 mg (2 mg Intravenous Given 09/17/19 1103)    ED Course  I have reviewed the triage vital signs and the nursing notes.  Pertinent labs & imaging results that were available during my care of the  patient were reviewed by me and considered in my medical decision making (see chart for details).    MDM Rules/Calculators/A&P                          44 year old with questionable seizure On presentation, the patient is alert, but still appears to be postictal.  Answering questions, however responding mostly with "I do not know".  Vitals on presentation reassuring, she is afebrile, not tachypneic  or tachycardic.  Physical exam with no gross neurologic abnormalities, moving all 4 extremities, no facial droop.  She does have a notable scratch across her face, unclear if this is new or chronic.   Cause of seizure ddx includes medication noncompliance, febrile seizure, brain metastases, meningitis, hypomagnesemia, drug use  We will start with basic labs, EKG, UA, CT head to rule out mets  11 AM: Patient had another full body tonic clonic seizure with initial downward gaze with no blink to threat, then progressive leftward gaze with no blink to threat. Seized for approximately 30-45 seconds.  Given 2 of Ativan, started on loading dose of 60mg /kg of Keppra.  Remains postictal, with evidence of tongue biting.   2:18 PM: Lab work with a CBC of leukocytosis of 12.1, normal platelets.  Could be in the setting of COVID-19.  CMP with hypernatremia 148, CO2 20.  No other electrolyte abnormalities.  She does have transient transaminitis compared to lab work 7 days ago.  Likely again in the setting of COVID-19.  Magnesium of 1.4.  UA and UDS still pending.  CT of the head without any evidence of metastases.  Consulted with Dr. Merlene Laughter with neurology.  He would like the patient to be admitted, likely for further evaluation and EEG.  He refers that her daily dose of Keppra can be increased to 1000 mg twice daily from her 500 mg twice daily dose.  3:00PM: Spoke with Dr. Carles Collet who will admit the patient to the hospital for further management. Dr. Merlene Laughter to follow.   Pt was seen and evaluated by Dr. Sedonia Small who is agreeable to the above plan and disposition.    Final Clinical Impression(s) / ED Diagnoses Final diagnoses:  Seizure [facility])    Rx / [place] Orders ED Discharge Orders    None       Lyndel Safe 09/17/19 1532    Maudie Flakes, MD 09/17/19 1537

## 2019-09-17 NOTE — ED Notes (Signed)
Pt COVID positive

## 2019-09-17 NOTE — ED Triage Notes (Signed)
Pt presents to ED following unwitnessed seizure at home. Pt noticed to have bitten tongue. Pt postictal upon EMS arrival.

## 2019-09-17 NOTE — H&P (Signed)
History and Physical  [name] ZOX:[id number] DOB: [birth year] DOA: 09/17/2019   PCP: [name], MD   Patient coming from: Home  Chief Complaint: seizure  HPI:  [name] is a 44 y.o. female with medical history of metastatic colon cancer with peritoneal dissemination on current chemotherapy supervised by her oncologist, Dr.Sherrill.  The patient also has a medical history of iron deficiency anemia, seizure disorder, hyperlipidemia, GERD, and enterocutaneous fistula.  She presented after a seizure at home on the morning of 09/17/2019.  Her spouse walked into her room to check up on her in the morning and noted the patient to be lethargic slumped over.  The patient did awaken but was slow to respond.  As result, EMS was activated.  Notably, the patient was diagnosed with COVID-19 on 09/10/2019.  She had high fevers up to 102 F at that time with a cough and chest congestion.  She was instructed by her oncologist to get a Covid test at that time.  Fortunately, patient has been fairly asymptomatic from a pulmonary standpoint regarding her COVID-19 infection.  She has not had any shortness of breath or worsening cough.  She has not had any nausea, vomiting.  She has chronic loose stools.  She has not been on any new medications.  Her last chemotherapy was approximately 3 weeks prior to this admission.  There have been no reports of headache, visual disturbance, chest pain, hemoptysis, hematochezia, melena, abdominal pain.  History per the patient's spouse reveals that the patient had a seizure approximately in December 2020.  She was started on Keppra at that time.  Prior to the seizure, patient has not had any seizures for over 7 years. During her time in the emergency department, the patient had another tonic-clonic type episode lasting 2 minutes.  She was given Valium.  At the time of my evaluation, patient is postictal.  She awakens and intermittently answers, but is somnolent.  She is  afebrile and hemodynamically stable with oxygen saturation 99% on room air.  BMP was unremarkable.  AST 63, ALT 49, alk phosphatase 107, total bilirubin 0.6.  WBC 12.1, hemoglobin 13.2, glucose 222,000.  CT of the brain was negative for acute findings.  Neurology was consulted to assist with management.  She was loaded with Keppra 1000 mg in the emergency department.  Assessment/Plan: Seizure disorder -increase keppra to 1000 mg bid -neurology consulted -MR brain -EEG -Ativan prn seizure  Metastatic colon cancer  -follows Dr. Benay Spice -last chemo 3 weeks prior to admission  COVID-19 Infection -she is currently stable on RA without fever or dyspnea -monitor inflammatory markers  Hypernatremia -start 1/2NS with KCL  Hypomagnesemia -replete -am mag level  Goals of Care -full code -palliative medicine consulted         Past Medical History:  Diagnosis Date  . ADD (attention deficit disorder)   . Anemia, iron deficiency   . Anxiety   . Colon cancer ([place]) 08/2013  . Depression   . Difficulty swallowing pills   . GERD (gastroesophageal reflux disease)   . Headache    migraines "long time ago"  . History of blood transfusion 08/16/2013  . History of migraine    no problems in "a long time"  . 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 Restless leg syndrome   . Seizures ([place])    last seizure 08/2012   Past Surgical History:  Procedure Laterality Date  . ABDOMINAL HYSTERECTOMY  08/24/2004  partial  . APPENDECTOMY  08/24/2004  . COLON SURGERY  08/11/2013   removed a foot of colon  . COLONOSCOPY  2019  . colonoscopy 10-18-14    . COLONOSCOPY WITH PROPOFOL  07/07/2013  . CYSTOSCOPY  11/01/2003  . ENDOMETRIAL FULGURATION  11/01/2003  . ESOPHAGOGASTRODUODENOSCOPY  07/07/2013  . IR IMAGING GUIDED PORT INSERTION  03/05/2019  . LAPAROSCOPIC LYSIS OF ADHESIONS  11/01/2003  . LAPAROSCOPIC PARTIAL COLECTOMY Right 08/11/2013   Procedure: LAPAROSCOPIC RIGHT  HEMICOLECTOMY;  Surgeon: Stark Klein, MD;  Location: [facility];  Service: General;  Laterality: Right;  . LAPAROTOMY N/A 12/28/2014   Procedure: EXPLORATORY LAPAROTOMY;  Surgeon: Everitt Amber, MD;  Location: [facility];  Service: Gynecology;  Laterality: N/A;  . LEFT OOPHORECTOMY  08/24/2004  . PANNICULECTOMY  08/24/2004  . PORT-A-CATH REMOVAL Left 06/08/2014   Procedure: REMOVAL PORT-A-CATH;  Surgeon: Stark Klein, MD;  Location: [facility];  Service: General;  Laterality: Left;  . PORTACATH PLACEMENT Left 09/09/2013   Procedure: INSERTION PORT-A-CATH;  Surgeon: Stark Klein, MD;  Location: [facility];  Service: General;  Laterality: Left;  . SALPINGOOPHORECTOMY Right 12/28/2014   Procedure: RIGHT SALPINGO OOPHORECTOMY;  Surgeon: Everitt Amber, MD;  Location: [facility];  Service: Gynecology;  Laterality: Right;  . TUBAL LIGATION  11/07/2000  . UNILATERAL SALPINGECTOMY Left 08/24/2004  . WISDOM TOOTH EXTRACTION Bilateral 1994   Social History:  reports that she has never smoked. She has never used smokeless tobacco. She reports that she does not drink alcohol and does not use drugs.   Family History  Problem Relation Age of Onset  . Colon polyps Mother        3+ colon polyps at each colonoscopy - "several"  . Colitis Mother   . Heart attack Mother   . Diabetes Mother   . Heart disease Mother   . Diabetes Father   . Heart attack Father   . Stroke Father   . Congestive Heart Failure Father   . Heart disease Father   . Cirrhosis Maternal Aunt   . Lung cancer Maternal Uncle        d. 71s; smoker and worked around asbestos  . Other Paternal Grandfather        "bone cancer"  . Colon cancer Maternal Uncle        dx early 64s  . Breast cancer Maternal Aunt        dx 50s-60s; s/p lumpectomy  . Dementia Maternal Aunt   . Other Cousin        maternal 1st cousin w/ "lung issues"  . Dementia Paternal Aunt   . Dementia Paternal Uncle   . Prostate cancer Paternal Uncle 31  . Esophageal cancer Neg  Hx   . Rectal cancer Neg Hx   . Stomach cancer Neg Hx      Allergies  Allergen Reactions  . Adhesive [Tape] Hives, Shortness Of Breath and Swelling    Tolerates paper tape  . Clindamycin/Lincomycin Itching and Rash    Generalized body rash with itching, lip ulcers  . Promethazine Other (See Comments)    Increase restless leg movement   . Eggs Or Egg-Derived Products Nausea And Vomiting  . Metoclopramide Hcl Other (See Comments)    EXACERBATES RESTLESS LEG SYNDROME  . Oxycodone-Acetaminophen Hives  . Sulfa Antibiotics Nausea And Vomiting  . Levaquin [Levofloxacin]     seizures  . Penicillins Itching    Has patient had a PCN reaction causing immediate rash, facial/tongue/throat swelling, SOB or lightheadedness  with hypotension: No Has patient had a PCN reaction causing severe rash involving mucus membranes or skin necrosis: No Has patient had a PCN reaction that required hospitalization No Has patient had a PCN reaction occurring within the last 10 years: No If all of the above answers are "NO", then may proceed with Cephalosporin use.      Prior to Admission medications   Medication Sig Start Date End Date Taking? Authorizing Provider  levETIRAcetam (KEPPRA) 500 MG tablet Take 1 tablet (500 mg total) by mouth 2 (two) times daily. 09/11/19  Yes Owens Shark, NP  magnesium oxide (MAG-OX) 400 (241.3 Mg) MG tablet Take 1 tablet (400 mg total) by mouth 2 (two) times daily. 08/20/19  Yes [name], MD  acetaminophen (TYLENOL) 500 MG tablet Take 1,000 mg by mouth every 6 (six) hours as needed.    [provider]  alum & mag hydroxide-simeth (MAALOX/MYLANTA) 200-200-20 MG/5ML suspension Take 30 mLs by mouth every 6 (six) hours as needed for indigestion or heartburn.    [provider]  doxycycline (VIBRA-TABS) 100 MG tablet Take 1 tablet (100 mg total) by mouth 2 (two) times daily. Patient not taking: Reported on 09/17/2019 06/29/19   Owens Shark, NP    lidocaine-prilocaine (EMLA) cream Apply to port site 1-2 hours prior to use Patient not taking: Reported on 09/17/2019 02/23/19   Owens Shark, NP  LORazepam (ATIVAN) 0.5 MG tablet Take 1 tablet (0.5 mg total) by mouth 2 (two) times daily as needed for anxiety. 08/13/19   [name], MD  nystatin (MYCOSTATIN/NYSTOP) powder Apply 1 application topically 3 (three) times daily. Patient not taking: Reported on 09/17/2019 05/20/19   Owens Shark, NP  prochlorperazine (COMPAZINE) 10 MG tablet Take 1 tablet (10 mg total) by mouth every 6 (six) hours as needed for nausea or vomiting. Patient not taking: Reported on 07/29/2019 07/15/19   Owens Shark, NP    Review of Systems:  Unobtainable due to post-ictal state Physical Exam: Vitals:   09/17/19 1330 09/17/19 1400 09/17/19 1430 09/17/19 1500  BP: 103/73 103/72 110/82 106/69  Pulse: 84 78 82 76  Resp: (!) 23 (!) 22 19 (!) 22  Temp:      TempSrc:      SpO2: 97% 97% 99% 99%  Weight:      Height:       General:  Awake and alert, NAD, nontoxic, pleasant/cooperative Head/Eye: No conjunctival hemorrhage, no icterus, [place]/AT, No nystagmus ENT:  No icterus,  No thrush, good dentition, no pharyngeal exudate Neck:  No masses, no lymphadenpathy, no bruits CV:  RRR, no rub, no gallop, no S3 Lung:  Bibasilar crackles Abdomen: soft/NT, +BS, nondistended, no peritoneal signs Ext: No cyanosis, No rashes, No petechiae, No lymphangitis, No edema   Labs on Admission:  Basic Metabolic Panel: Recent Labs  Lab 09/17/19 1106  NA 148*  K 3.5  CL 107  CO2 20*  GLUCOSE 183*  BUN 13  CREATININE 0.72  CALCIUM 9.1  MG 1.4*   Liver Function Tests: Recent Labs  Lab 09/17/19 1106  AST 63*  ALT 49*  ALKPHOS 107  BILITOT 0.6  PROT 7.8  ALBUMIN 4.2   No results for input(s): LIPASE, AMYLASE in the last 168 hours. No results for input(s): AMMONIA in the last 168 hours. CBC: Recent Labs  Lab 09/17/19 1106  WBC 12.1*  NEUTROABS 10.2*  HGB 13.2   HCT 46.2*  MCV 87.2  PLT 222  Coagulation Profile: No results for input(s): INR, PROTIME in the last 168 hours. Cardiac Enzymes: No results for input(s): CKTOTAL, CKMB, CKMBINDEX, TROPONINI in the last 168 hours. BNP: Invalid input(s): POCBNP CBG: Recent Labs  Lab 09/17/19 1255  GLUCAP 140*   Urine analysis:    Component Value Date/Time   COLORURINE YELLOW 04/10/2019 0144   APPEARANCEUR CLEAR 04/10/2019 0144   LABSPEC 1.021 04/10/2019 0144   LABSPEC 1.030 08/17/2014 0834   PHURINE 6.0 04/10/2019 0144   GLUCOSEU >=500 (A) 04/10/2019 0144   GLUCOSEU Negative 08/17/2014 0834   HGBUR NEGATIVE 04/10/2019 0144   HGBUR negative 02/07/2009 1435   BILIRUBINUR NEGATIVE 04/10/2019 0144   BILIRUBINUR Negative 08/17/2014 0834   KETONESUR NEGATIVE 04/10/2019 0144   PROTEINUR 30 (A) 04/10/2019 0144   UROBILINOGEN 1.0 12/20/2014 1306   UROBILINOGEN 0.2 08/17/2014 0834   NITRITE NEGATIVE 04/10/2019 0144   LEUKOCYTESUR NEGATIVE 04/10/2019 0144   LEUKOCYTESUR Trace 08/17/2014 0834   Sepsis Labs: _0 (procalcitonin:4,lacticidven:4) ) Recent Results (from the past 240 hour(s))  SARS Coronavirus 2 by RT PCR (hospital order, performed in [facility] hospital lab) Nasopharyngeal Throat     Status: Abnormal   Collection Time: 09/10/19 10:32 AM   Specimen: Throat; Nasopharyngeal  Result Value Ref Range Status   SARS Coronavirus 2 POSITIVE (A) NEGATIVE Final    Comment: RESULT CALLED TO, READ BACK BY AND VERIFIED WITH: J.STRENK AT 3785 ON 09/10/19 BY N.THOMPSON (NOTE) SARS-CoV-2 target nucleic acids are DETECTED  SARS-CoV-2 RNA is generally detectable in upper respiratory specimens  during the acute phase of infection.  Positive results are indicative  of the presence of the identified virus, but do not rule out bacterial infection or co-infection with other pathogens not detected by the test.  Clinical correlation with patient history and  other diagnostic information is  necessary to determine patient infection status.  The expected result is negative.  Fact Sheet for Patients:   StrictlyIdeas.no   Fact Sheet for Healthcare Providers:   BankingDealers.co.za    This test is not yet approved or cleared by the [place] FDA and  has been authorized for detection and/or diagnosis of SARS-CoV-2 by FDA under an Emergency Use Authorization (EUA).  This EUA will remain in effect (meaning t his test can be used) for the duration of  the COVID-19 declaration under Section 564(b)(1) of the Act, 21 U.S.C. section 360-bbb-3(b)(1), unless the authorization is terminated or revoked sooner.  Performed at [facility], [place] [address] [place], [place] [address]   Group A Strep by PCR     Status: None   Collection Time: 09/10/19 11:14 AM   Specimen: Throat; Sterile Swab  Result Value Ref Range Status   Group A Strep by PCR NOT DETECTED NOT DETECTED Final    Comment: Performed at [facility], [place] [address], [place], [place] [address]     Radiological Exams on Admission: CT Head Wo Contrast  Result Date: 09/17/2019 CLINICAL DATA:  Seizure today.  COVID positive. EXAM: CT HEAD WITHOUT CONTRAST TECHNIQUE: Contiguous axial images were obtained from the base of the skull through the vertex without intravenous contrast. COMPARISON:  Head CT dated 04/09/2019. FINDINGS: Brain: Ventricles are within normal limits in size. There is no mass, hemorrhage, edema or other evidence of acute parenchymal abnormality. No extra-axial hemorrhage. Vascular: No hyperdense vessel or unexpected calcification. Skull: Normal. Negative for fracture or focal lesion. Sinuses/Orbits: Small amount of layering fluid within the sphenoid sinuses. Mild mucosal thickening within the  ethmoid air cells. Visualized periorbital and retro-orbital soft tissues are unremarkable. Other: None. IMPRESSION: 1. No acute  intracranial abnormality. No intracranial mass, hemorrhage or edema. 2. Mild sphenoid and ethmoid sinus disease, of uncertain age. Electronically Signed   By: Franki Cabot M.D.   On: 09/17/2019 12:44        Time spent:60 minutes Code Status:   FULL Family Communication:  Spouse updated 8/12 Disposition Plan: expect 1-2 day hospitalization Consults called: neurology DVT Prophylaxis: [place] Lovenox  Orson Eva, DO  [facility] Pager [phone]  If 7PM-7AM, please contact night-coverage [email or web address] Password [facility] 09/17/2019, 3:34 PM

## 2019-09-18 ENCOUNTER — Observation Stay (HOSPITAL_COMMUNITY)
Admit: 2019-09-18 | Discharge: 2019-09-18 | Disposition: A | Discharge status: Home / Self Care | Payer: BC Managed Care – PPO | Attending: Internal Medicine | Admitting: Internal Medicine

## 2019-09-18 ENCOUNTER — Other Ambulatory Visit: Payer: Self-pay | Admitting: Radiation Therapy

## 2019-09-18 DIAGNOSIS — R413 Other amnesia: Secondary | ICD-10-CM | POA: Diagnosis present

## 2019-09-18 DIAGNOSIS — C787 Secondary malignant neoplasm of liver and intrahepatic bile duct: Secondary | ICD-10-CM | POA: Diagnosis not present

## 2019-09-18 DIAGNOSIS — F329 Major depressive disorder, single episode, unspecified: Secondary | ICD-10-CM | POA: Diagnosis present

## 2019-09-18 DIAGNOSIS — Z800 Family history of malignant neoplasm of digestive organs: Secondary | ICD-10-CM | POA: Diagnosis not present

## 2019-09-18 DIAGNOSIS — F988 Other specified behavioral and emotional disorders with onset usually occurring in childhood and adolescence: Secondary | ICD-10-CM | POA: Diagnosis present

## 2019-09-18 DIAGNOSIS — D696 Thrombocytopenia, unspecified: Secondary | ICD-10-CM | POA: Diagnosis present

## 2019-09-18 DIAGNOSIS — Z79899 Other long term (current) drug therapy: Secondary | ICD-10-CM | POA: Diagnosis not present

## 2019-09-18 DIAGNOSIS — U071 COVID-19: Secondary | ICD-10-CM | POA: Diagnosis present

## 2019-09-18 DIAGNOSIS — G2581 Restless legs syndrome: Secondary | ICD-10-CM | POA: Diagnosis present

## 2019-09-18 DIAGNOSIS — G40101 Localization-related (focal) (partial) symptomatic epilepsy and epileptic syndromes with simple partial seizures, not intractable, with status epilepticus: Secondary | ICD-10-CM | POA: Diagnosis present

## 2019-09-18 DIAGNOSIS — D849 Immunodeficiency, unspecified: Secondary | ICD-10-CM | POA: Diagnosis present

## 2019-09-18 DIAGNOSIS — Z85038 Personal history of other malignant neoplasm of large intestine: Secondary | ICD-10-CM | POA: Diagnosis not present

## 2019-09-18 DIAGNOSIS — C7951 Secondary malignant neoplasm of bone: Secondary | ICD-10-CM | POA: Diagnosis present

## 2019-09-18 DIAGNOSIS — F419 Anxiety disorder, unspecified: Secondary | ICD-10-CM | POA: Diagnosis present

## 2019-09-18 DIAGNOSIS — E876 Hypokalemia: Secondary | ICD-10-CM | POA: Diagnosis present

## 2019-09-18 DIAGNOSIS — E785 Hyperlipidemia, unspecified: Secondary | ICD-10-CM | POA: Diagnosis present

## 2019-09-18 DIAGNOSIS — D6481 Anemia due to antineoplastic chemotherapy: Secondary | ICD-10-CM | POA: Diagnosis not present

## 2019-09-18 DIAGNOSIS — Z881 Allergy status to other antibiotic agents status: Secondary | ICD-10-CM | POA: Diagnosis not present

## 2019-09-18 DIAGNOSIS — E870 Hyperosmolality and hypernatremia: Secondary | ICD-10-CM | POA: Diagnosis present

## 2019-09-18 DIAGNOSIS — K219 Gastro-esophageal reflux disease without esophagitis: Secondary | ICD-10-CM | POA: Diagnosis present

## 2019-09-18 DIAGNOSIS — G9341 Metabolic encephalopathy: Secondary | ICD-10-CM | POA: Diagnosis present

## 2019-09-18 DIAGNOSIS — R569 Unspecified convulsions: Secondary | ICD-10-CM | POA: Diagnosis present

## 2019-09-18 DIAGNOSIS — C7931 Secondary malignant neoplasm of brain: Secondary | ICD-10-CM | POA: Diagnosis present

## 2019-09-18 DIAGNOSIS — Z801 Family history of malignant neoplasm of trachea, bronchus and lung: Secondary | ICD-10-CM | POA: Diagnosis not present

## 2019-09-18 DIAGNOSIS — Z9851 Tubal ligation status: Secondary | ICD-10-CM | POA: Diagnosis not present

## 2019-09-18 DIAGNOSIS — C189 Malignant neoplasm of colon, unspecified: Secondary | ICD-10-CM | POA: Insufficient documentation

## 2019-09-18 DIAGNOSIS — Z515 Encounter for palliative care: Secondary | ICD-10-CM | POA: Insufficient documentation

## 2019-09-18 DIAGNOSIS — Z8543 Personal history of malignant neoplasm of ovary: Secondary | ICD-10-CM | POA: Diagnosis not present

## 2019-09-18 LAB — CBC
HCT: 34.6 % — ABNORMAL LOW (ref 36.0–46.0)
Hemoglobin: 10.3 g/dL — ABNORMAL LOW (ref 12.0–15.0)
MCH: 25.1 pg — ABNORMAL LOW (ref 26.0–34.0)
MCHC: 29.8 g/dL — ABNORMAL LOW (ref 30.0–36.0)
MCV: 84.4 fL (ref 80.0–100.0)
Platelets: 144 10*3/uL — ABNORMAL LOW (ref 150–400)
RBC: 4.1 MIL/uL (ref 3.87–5.11)
RDW: 17.4 % — ABNORMAL HIGH (ref 11.5–15.5)
WBC: 2.6 10*3/uL — ABNORMAL LOW (ref 4.0–10.5)
nRBC: 0 % (ref 0.0–0.2)

## 2019-09-18 LAB — COMPREHENSIVE METABOLIC PANEL
ALT: 36 U/L (ref 0–44)
AST: 36 U/L (ref 15–41)
Albumin: 3.5 g/dL (ref 3.5–5.0)
Alkaline Phosphatase: 83 U/L (ref 38–126)
Anion gap: 9 (ref 5–15)
BUN: 12 mg/dL (ref 6–20)
CO2: 23 mmol/L (ref 22–32)
Calcium: 8.6 mg/dL — ABNORMAL LOW (ref 8.9–10.3)
Chloride: 110 mmol/L (ref 98–111)
Creatinine, Ser: 0.42 mg/dL — ABNORMAL LOW (ref 0.44–1.00)
GFR calc Af Amer: >60 mL/min (ref >60)
GFR calc non Af Amer: >60 mL/min (ref >60)
Glucose, Bld: 101 mg/dL — ABNORMAL HIGH (ref 70–99)
Potassium: 3.3 mmol/L — ABNORMAL LOW (ref 3.5–5.1)
Sodium: 142 mmol/L (ref 135–145)
Total Bilirubin: 0.7 mg/dL (ref 0.3–1.2)
Total Protein: 6.3 g/dL — ABNORMAL LOW (ref 6.5–8.1)

## 2019-09-18 LAB — MAGNESIUM: Magnesium: 1.7 mg/dL (ref 1.7–2.4)

## 2019-09-18 LAB — C-REACTIVE PROTEIN: CRP: 0.6 mg/dL (ref <1.0)

## 2019-09-18 LAB — D-DIMER, QUANTITATIVE: D-Dimer, Quant: 0.94 ug/mL-FEU — ABNORMAL HIGH (ref 0.00–0.50)

## 2019-09-18 LAB — FERRITIN: Ferritin: 182 ng/mL (ref 11–307)

## 2019-09-18 MED ORDER — LEVETIRACETAM IN NACL 1500 MG/100ML IV SOLN
1500.0000 mg | Freq: Two times a day (BID) | INTRAVENOUS | Status: DC
  Administered 2019-09-18 – 2019-09-19 (×2): 1500 mg via INTRAVENOUS
  Filled 2019-09-18 (×2): qty 100

## 2019-09-18 NOTE — Consult Note (Signed)
[place] A. Merlene Laughter, MD     [email or web address]          [name] is an 44 y.o. female.   ASSESSMENT/PLAN: 1. Breakthrough seizures with patient having series of back-to-back seizures likely meeting the criteria for status epilepticus now resolved.  EEG shows electrographic generalized seizure.  This comports with the patient's known history of epilepsy.  The dose of Keppra will be increased to 1500 mg twice a day. 2. 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 3. COVID-19 infection 4. Metastatic colon cancer   No clinical seizures are reported.  Her cognition has improved significantly.  She had an abnormal EEG with a electrographic seizure.  She however has had 2 previous EEGs documented in her current records showing generalized spike slow wave and polyspike consistent with generalized epilepsy syndrome.  The patient tells me that she has seizures from time to time but not frequently.  She has been compliant with Keppra 500 mg twice a day.     GENERAL: This is a drowsy female who is resting well but in no acute distress.  HEENT: There is bruises and some superficial laceration left facial region. Neck is supple.  Tongue trauma is also noted on both sides especially on the right.  EXTREMITIES: No edema   BACK: Normal  SKIN: Normal by inspection.    MENTAL STATUS: She lays in bed with eyes closed. She does open her eyes to verbal commands. She follows simple commands. She knows to the hospital but unaware as to why she is here. She seems amnestic for her current medical situation.  MOTOR: Normal tone, bulk and strength; no pronator drift.  COORDINATION: Left finger to nose is normal, right finger to nose is normal, No rest tremor; no intention tremor; no postural tremor; no bradykinesia.  REFLEXES: Deep tendon reflexes are symmetrical and normal.    SENSATION: Normal to pain.     Brain  MRI done today was limited. No contrast was given. No acute changes are noted on DWI. No white matter lesions. No evidence evidence of swelling, edema or encephalomalacia. Scan is unremarkable.       Blood pressure 109/69, pulse 86, temperature 98.4 F (36.9 C), temperature source Oral, resp. rate 15, height 5\' 6"  (1.676 m), weight 64.4 kg, SpO2 100 %.  Past Medical History:  Diagnosis Date  . ADD (attention deficit disorder)   . Anemia, iron deficiency   . Anxiety   . Colon cancer ([place]) 08/2013  . Depression   . Difficulty swallowing pills   . GERD (gastroesophageal reflux disease)   . Headache    migraines "long time ago"  . History of blood transfusion 08/16/2013  . History of migraine    no problems in "a long time"  . 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 Restless leg syndrome   . Seizures ([place])    last seizure 08/2012    Past Surgical History:  Procedure Laterality Date  . ABDOMINAL HYSTERECTOMY  08/24/2004   partial  . APPENDECTOMY  08/24/2004  . COLON SURGERY  08/11/2013   removed a foot of colon  . COLONOSCOPY  2019  . colonoscopy 10-18-14    . COLONOSCOPY WITH PROPOFOL  07/07/2013  . CYSTOSCOPY  11/01/2003  . ENDOMETRIAL FULGURATION  11/01/2003  . ESOPHAGOGASTRODUODENOSCOPY  07/07/2013  . IR IMAGING GUIDED PORT INSERTION  03/05/2019  . LAPAROSCOPIC LYSIS OF ADHESIONS  11/01/2003  . LAPAROSCOPIC PARTIAL COLECTOMY Right 08/11/2013  Procedure: LAPAROSCOPIC RIGHT HEMICOLECTOMY;  Surgeon: Stark Klein, MD;  Location: [facility];  Service: General;  Laterality: Right;  . LAPAROTOMY N/A 12/28/2014   Procedure: EXPLORATORY LAPAROTOMY;  Surgeon: Everitt Amber, MD;  Location: [facility];  Service: Gynecology;  Laterality: N/A;  . LEFT OOPHORECTOMY  08/24/2004  . PANNICULECTOMY  08/24/2004  . PORT-A-CATH REMOVAL Left 06/08/2014   Procedure: REMOVAL PORT-A-CATH;  Surgeon: Stark Klein, MD;  Location: [facility];  Service: General;  Laterality: Left;  . PORTACATH PLACEMENT Left  09/09/2013   Procedure: INSERTION PORT-A-CATH;  Surgeon: Stark Klein, MD;  Location: [facility];  Service: General;  Laterality: Left;  . SALPINGOOPHORECTOMY Right 12/28/2014   Procedure: RIGHT SALPINGO OOPHORECTOMY;  Surgeon: Everitt Amber, MD;  Location: [facility];  Service: Gynecology;  Laterality: Right;  . TUBAL LIGATION  11/07/2000  . UNILATERAL SALPINGECTOMY Left 08/24/2004  . WISDOM TOOTH EXTRACTION Bilateral 1994    Family History  Problem Relation Age of Onset  . Colon polyps Mother        3+ colon polyps at each colonoscopy - "several"  . Colitis Mother   . Heart attack Mother   . Diabetes Mother   . Heart disease Mother   . Diabetes Father   . Heart attack Father   . Stroke Father   . Congestive Heart Failure Father   . Heart disease Father   . Cirrhosis Maternal Aunt   . Lung cancer Maternal Uncle        d. 22s; smoker and worked around asbestos  . Other Paternal Grandfather        "bone cancer"  . Colon cancer Maternal Uncle        dx early 99s  . Breast cancer Maternal Aunt        dx 50s-60s; s/p lumpectomy  . Dementia Maternal Aunt   . Other Cousin        maternal 1st cousin w/ "lung issues"  . Dementia Paternal Aunt   . Dementia Paternal Uncle   . Prostate cancer Paternal Uncle 94  . Esophageal cancer Neg Hx   . Rectal cancer Neg Hx   . Stomach cancer Neg Hx     Social History:  reports that she has never smoked. She has never used smokeless tobacco. She reports that she does not drink alcohol and does not use drugs.  Allergies:  Allergies  Allergen Reactions  . Adhesive [Tape] Hives, Shortness Of Breath and Swelling    Tolerates paper tape  . Clindamycin/Lincomycin Itching and Rash    Generalized body rash with itching, lip ulcers  . Promethazine Other (See Comments)    Increase restless leg movement   . Eggs Or Egg-Derived Products Nausea And Vomiting  . Metoclopramide Hcl Other (See Comments)    EXACERBATES RESTLESS LEG SYNDROME  .  Oxycodone-Acetaminophen Hives  . Sulfa Antibiotics Nausea And Vomiting  . Levaquin [Levofloxacin]     seizures  . Penicillins Itching    Has patient had a PCN reaction causing immediate rash, facial/tongue/throat swelling, SOB or lightheadedness with hypotension: No Has patient had a PCN reaction causing severe rash involving mucus membranes or skin necrosis: No Has patient had a PCN reaction that required hospitalization No Has patient had a PCN reaction occurring within the last 10 years: No If all of the above answers are "NO", then may proceed with Cephalosporin use.     Medications: Prior to Admission medications   Medication Sig Start Date End Date Taking? Authorizing Provider  levETIRAcetam (  KEPPRA) 500 MG tablet Take 1 tablet (500 mg total) by mouth 2 (two) times daily. 09/11/19  Yes Owens Shark, NP  magnesium oxide (MAG-OX) 400 (241.3 Mg) MG tablet Take 1 tablet (400 mg total) by mouth 2 (two) times daily. 08/20/19  Yes [name], MD  acetaminophen (TYLENOL) 500 MG tablet Take 1,000 mg by mouth every 6 (six) hours as needed.    [provider]  alum & mag hydroxide-simeth (MAALOX/MYLANTA) 200-200-20 MG/5ML suspension Take 30 mLs by mouth every 6 (six) hours as needed for indigestion or heartburn.    [provider]  doxycycline (VIBRA-TABS) 100 MG tablet Take 1 tablet (100 mg total) by mouth 2 (two) times daily. Patient not taking: Reported on 09/17/2019 06/29/19   Owens Shark, NP  lidocaine-prilocaine (EMLA) cream Apply to port site 1-2 hours prior to use Patient not taking: Reported on 09/17/2019 02/23/19   Owens Shark, NP  LORazepam (ATIVAN) 0.5 MG tablet Take 1 tablet (0.5 mg total) by mouth 2 (two) times daily as needed for anxiety. 08/13/19   [name], MD  nystatin (MYCOSTATIN/NYSTOP) powder Apply 1 application topically 3 (three) times daily. Patient not taking: Reported on 09/17/2019 05/20/19   Owens Shark, NP  prochlorperazine (COMPAZINE)  10 MG tablet Take 1 tablet (10 mg total) by mouth every 6 (six) hours as needed for nausea or vomiting. Patient not taking: Reported on 07/29/2019 07/15/19   Owens Shark, NP    Scheduled Meds: . enoxaparin (LOVENOX) injection  40 mg Subcutaneous Q24H  . magnesium oxide  400 mg Oral BID   Continuous Infusions: . levETIRAcetam Stopped (09/18/19 1050)  . levETIRAcetam     PRN Meds:.acetaminophen **OR** acetaminophen, ondansetron **OR** ondansetron (ZOFRAN) IV     Results for orders placed or performed during the hospital encounter of 09/17/19 (from the past 48 hour(s))  CBC with Differential     Status: Abnormal   Collection Time: 09/17/19 11:06 AM  Result Value Ref Range   WBC 12.1 (H) 4.0 - 10.5 K/uL   RBC 5.30 (H) 3.87 - 5.11 MIL/uL   Hemoglobin 13.2 12.0 - 15.0 g/dL   HCT 46.2 (H) 36 - 46 %   MCV 87.2 80.0 - 100.0 fL   MCH 24.9 (L) 26.0 - 34.0 pg   MCHC 28.6 (L) 30.0 - 36.0 g/dL   RDW 17.4 (H) 11.5 - 15.5 %   Platelets 222 150 - 400 K/uL   nRBC 0.0 0.0 - 0.2 %   Neutrophils Relative % 84 %   Neutro Abs 10.2 (H) 1.7 - 7.7 K/uL   Lymphocytes Relative 11 %   Lymphs Abs 1.4 0.7 - 4.0 K/uL   Monocytes Relative 4 %   Monocytes Absolute 0.4 0 - 1 K/uL   Eosinophils Relative 0 %   Eosinophils Absolute 0.0 0 - 0 K/uL   Basophils Relative 0 %   Basophils Absolute 0.0 0 - 0 K/uL   Immature Granulocytes 1 %   Abs Immature Granulocytes 0.09 (H) 0.00 - 0.07 K/uL    Comment: Performed at [facility], [address] [place], [place] [address]  Comprehensive metabolic panel     Status: Abnormal   Collection Time: 09/17/19 11:06 AM  Result Value Ref Range   Sodium 148 (H) 135 - 145 mmol/L   Potassium 3.5 3.5 - 5.1 mmol/L   Chloride 107 98 - 111 mmol/L   CO2 20 (L) 22 - 32 mmol/L   Glucose, Bld 183 (  H) 70 - 99 mg/dL    Comment: Glucose reference range applies only to samples taken after fasting for at least 8 hours.   BUN 13 6 - 20 mg/dL   Creatinine, Ser 0.72 0.44 - 1.00 mg/dL     Calcium 9.1 8.9 - 10.3 mg/dL   Total Protein 7.8 6.5 - 8.1 g/dL   Albumin 4.2 3.5 - 5.0 g/dL   AST 63 (H) 15 - 41 U/L   ALT 49 (H) 0 - 44 U/L   Alkaline Phosphatase 107 38 - 126 U/L   Total Bilirubin 0.6 0.3 - 1.2 mg/dL   GFR calc non Af Amer >60 >60 mL/min   GFR calc Af Amer >60 >60 mL/min    Comment: Performed at [facility], [address], [place], [place] [address]  Magnesium     Status: Abnormal   Collection Time: 09/17/19 11:06 AM  Result Value Ref Range   Magnesium 1.4 (L) 1.7 - 2.4 mg/dL    Comment: Performed at [facility], [address], [place], [place] [address]  POC CBG, ED     Status: Abnormal   Collection Time: 09/17/19 12:55 PM  Result Value Ref Range   Glucose-Capillary 140 (H) 70 - 99 mg/dL    Comment: Glucose reference range applies only to samples taken after fasting for at least 8 hours.  Urinalysis, Routine w reflex microscopic Urine, Catheterized     Status: Abnormal   Collection Time: 09/17/19  2:35 PM  Result Value Ref Range   Color, Urine YELLOW YELLOW   APPearance CLEAR CLEAR   Specific Gravity, Urine 1.019 1.005 - 1.030   pH 7.0 5.0 - 8.0   Glucose, UA NEGATIVE NEGATIVE mg/dL   Hgb urine dipstick NEGATIVE NEGATIVE   Bilirubin Urine NEGATIVE NEGATIVE   Ketones, ur NEGATIVE NEGATIVE mg/dL   Protein, ur 30 (A) NEGATIVE mg/dL   Nitrite NEGATIVE NEGATIVE   Leukocytes,Ua NEGATIVE NEGATIVE   RBC / HPF 0-5 0 - 5 RBC/hpf   WBC, UA 0-5 0 - 5 WBC/hpf   Bacteria, UA NONE SEEN NONE SEEN   Squamous Epithelial / LPF 0-5 0 - 5   Mucus PRESENT    Amorphous Crystal PRESENT     Comment: Performed at [facility], [address], [place], [place] [address]  Rapid urine drug screen (hospital performed)     Status: Abnormal   Collection Time: 09/17/19  2:35 PM  Result Value Ref Range   Opiates NONE DETECTED NONE DETECTED   Cocaine NONE DETECTED NONE DETECTED   Benzodiazepines POSITIVE (A) NONE DETECTED   Amphetamines NONE DETECTED NONE DETECTED    Tetrahydrocannabinol NONE DETECTED NONE DETECTED   Barbiturates NONE DETECTED NONE DETECTED    Comment: (NOTE) DRUG SCREEN FOR MEDICAL PURPOSES ONLY.  IF CONFIRMATION IS NEEDED FOR ANY PURPOSE, NOTIFY LAB WITHIN 5 DAYS.  LOWEST DETECTABLE LIMITS FOR URINE DRUG SCREEN Drug Class                     Cutoff (ng/mL) Amphetamine and metabolites    1000 Barbiturate and metabolites    200 Benzodiazepine                 638 Tricyclics and metabolites     300 Opiates and metabolites        300 Cocaine and metabolites        300 THC  50 Performed at [facility], [address] [place], [place] [address]   CBC     Status: Abnormal   Collection Time: 09/18/19  1:36 AM  Result Value Ref Range   WBC 2.6 (L) 4.0 - 10.5 K/uL   RBC 4.10 3.87 - 5.11 MIL/uL   Hemoglobin 10.3 (L) 12.0 - 15.0 g/dL   HCT 34.6 (L) 36 - 46 %   MCV 84.4 80.0 - 100.0 fL   MCH 25.1 (L) 26.0 - 34.0 pg   MCHC 29.8 (L) 30.0 - 36.0 g/dL   RDW 17.4 (H) 11.5 - 15.5 %   Platelets 144 (L) 150 - 400 K/uL   nRBC 0.0 0.0 - 0.2 %    Comment: Performed at [facility], [address], [place], [place] [address]  Magnesium     Status: None   Collection Time: 09/18/19  1:36 AM  Result Value Ref Range   Magnesium 1.7 1.7 - 2.4 mg/dL    Comment: Performed at [facility], [address] [place], [place] [address]  C-reactive protein     Status: None   Collection Time: 09/18/19  1:36 AM  Result Value Ref Range   CRP 0.6 <1.0 mg/dL    Comment: Performed at [facility], [address], [place], [place] [address]  Ferritin     Status: None   Collection Time: 09/18/19  1:36 AM  Result Value Ref Range   Ferritin 182 11 - 307 ng/mL    Comment: Performed at [facility], [address] [place], [place] [address]  D-dimer, quantitative (not at [facility])     Status: Abnormal   Collection Time: 09/18/19  1:36 AM  Result Value Ref Range   D-Dimer, Quant 0.94 (H) 0.00 - 0.50 ug/mL-FEU    Comment: (NOTE) At the  manufacturer cut-off of 0.50 ug/mL FEU, this assay has been documented to exclude PE with a sensitivity and negative predictive value of 97 to 99%.  At this time, this assay has not been approved by the FDA to exclude DVT/VTE. Results should be correlated with clinical presentation. Performed at [facility], [address], [place], [place] [address]   Comprehensive metabolic panel     Status: Abnormal   Collection Time: 09/18/19  9:52 AM  Result Value Ref Range   Sodium 142 135 - 145 mmol/L   Potassium 3.3 (L) 3.5 - 5.1 mmol/L   Chloride 110 98 - 111 mmol/L   CO2 23 22 - 32 mmol/L   Glucose, Bld 101 (H) 70 - 99 mg/dL    Comment: Glucose reference range applies only to samples taken after fasting for at least 8 hours.   BUN 12 6 - 20 mg/dL   Creatinine, Ser 0.42 (L) 0.44 - 1.00 mg/dL   Calcium 8.6 (L) 8.9 - 10.3 mg/dL   Total Protein 6.3 (L) 6.5 - 8.1 g/dL   Albumin 3.5 3.5 - 5.0 g/dL   AST 36 15 - 41 U/L   ALT 36 0 - 44 U/L   Alkaline Phosphatase 83 38 - 126 U/L   Total Bilirubin 0.7 0.3 - 1.2 mg/dL   GFR calc non Af Amer >60 >60 mL/min   GFR calc Af Amer >60 >60 mL/min   Anion gap 9 5 - 15    Comment: Performed at [facility], [address] [place], [place] [address]   *Note: Due to a large number of results and/or encounters for the requested time period, some results have not been displayed. A  complete set of results can be found in Results Review.    Studies/Results:     Kirk Sampley A. Merlene Laughter, M.D.  Diplomate, Tax adviser of Psychiatry and Neurology ( Neurology). 09/18/2019, 6:04 PM

## 2019-09-18 NOTE — ED Notes (Signed)
Pt bed switched out to hospital bed d/t length of stay. Seizure pads placed on new bed and pt oriented to usage of bed. BSC placed at bedside. Instructed pt to call for assistance with transfers. Warm blanket given, call light within reach

## 2019-09-18 NOTE — Progress Notes (Addendum)
PROGRESS NOTE  ANJANI FEUERBORN DDU:[id number] DOB: 04-08-1975 DOA: 09/17/2019 PCP: [name], MD  Brief History:  44 y.o. female with medical history of metastatic colon cancer with peritoneal dissemination on current chemotherapy supervised by her oncologist, Dr.Sherrill.  The patient also has a medical history of iron deficiency anemia, seizure disorder, hyperlipidemia, GERD, and enterocutaneous fistula.  She presented after a seizure at home on the morning of 09/17/2019.  Her spouse walked into her room to check up on her in the morning and noted the patient to be lethargic slumped over.  The patient did awaken but was slow to respond.  As result, EMS was activated.  Notably, the patient was diagnosed with COVID-19 on 09/10/2019.  She had high fevers up to 102 F at that time with a cough and chest congestion.  She was instructed by her oncologist to get a Covid test at that time.  Fortunately, patient has been fairly asymptomatic from a pulmonary standpoint regarding her COVID-19 infection.  She has not had any shortness of breath or worsening cough.  She has not had any nausea, vomiting.  She has chronic loose stools.  She has not been on any new medications.  Her last chemotherapy was approximately 3 weeks prior to this admission.  There have been no reports of headache, visual disturbance, chest pain, hemoptysis, hematochezia, melena, abdominal pain.  History per the patient's spouse reveals that the patient had a seizure approximately in December 2020.  She was started on Keppra at that time.  Prior to the seizure, patient has not had any seizures for over 7 years. During her time in the emergency department, the patient had another tonic-clonic type episode lasting 2 minutes.  She was given Valium.  At the time of my evaluation, patient is postictal.  She awakens and intermittently answers, but is somnolent.  She is afebrile and hemodynamically stable with oxygen saturation 99% on room  air.  BMP was unremarkable.  AST 63, ALT 49, alk phosphatase 107, total bilirubin 0.6.  WBC 12.1, hemoglobin 13.2, glucose 222,000.  CT of the brain was negative for acute findings.  Neurology was consulted to assist with management.  She was loaded with Keppra 1000 mg in the emergency department.   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 Benay Spice -last chemo 3 weeks prior to admission  COVID-19 Infection -she is currently stable on RA without fever or dyspnea -monitor inflammatory markers -patient received Regen CoV on 09/12/19  Hypernatremia -Continue 1/2NS with KCL  Hypomagnesemia/hypokalemia -replete -am mag level  Thrombocytopenia -chronic -am CBC -monitor for signs of bleed  Goals of Care -full code -palliative medicine consulted      Status is: Inpatient  Remains inpatient appropriate because:IV treatments appropriate due to intensity of illness or inability to take PO   Dispo: The patient is from: Home              Anticipated d/c is to: Home              Anticipated d/c date is: 09/19/19              Patient currently is not medically stable to d/c.        Family Communication:  Spouse updated 8/13  Consultants:  neurology  Code Status:  FULL   DVT Prophylaxis:  Rennert Lovenox   Procedures: As Listed in Progress Note Above  Antibiotics: None  RN Pressure Injury Documentation:        Subjective: Patient denies fevers, chills, headache, chest pain, dyspnea, nausea, vomiting, diarrhea, abdominal pain, dysuria, hematuria, hematochezia, and melena.   Objective: Vitals:   09/18/19 0530 09/18/19 0600 09/18/19 0700 09/18/19 1101  BP:  116/75 116/74 103/64 113/74  Pulse: 79 64 79 63  Resp: _0 Temp:      TempSrc:      SpO2: 100% 100% 100% 97%  Weight:      Height:        Intake/Output Summary (Last 24 hours) at 09/18/2019 1436 Last data filed at 09/18/2019 1050 Gross per 24 hour  Intake 952.18 ml  Output --  Net 952.18 ml   Weight change:  Exam:   General:  Pt is alert, follows commands appropriately, not in acute distress  HEENT: No icterus, No thrush, No neck mass, [place]/AT  Cardiovascular: RRR, S1/S2, no rubs, no gallops  Respiratory: bibasilar crackles. No wheeze  Abdomen: Soft/+BS, non tender, non distended, no guarding  Extremities: No edema, No lymphangitis, No petechiae, No rashes, no synovitis  Neuro:  CN II-XII intact, strength 4/5 in RUE, RLE, strength 4/5 LUE, LLE; sensation intact bilateral; no dysmetria; babinski equivocal     Data Reviewed: I have personally reviewed following labs and imaging studies Basic Metabolic Panel: Recent Labs  Lab 09/17/19 1106 09/18/19 0136 09/18/19 0952  NA 148*  --  142  K 3.5  --  3.3*  CL 107  --  110  CO2 20*  --  23  GLUCOSE 183*  --  101*  BUN 13  --  12  CREATININE 0.72  --  0.42*  CALCIUM 9.1  --  8.6*  MG 1.4* 1.7  --    Liver Function Tests: Recent Labs  Lab 09/17/19 1106 09/18/19 0952  AST 63* 36  ALT 49* 36  ALKPHOS 107 83  BILITOT 0.6 0.7  PROT 7.8 6.3*  ALBUMIN 4.2 3.5   No results for input(s): LIPASE, AMYLASE in the last 168 hours. No results for input(s): AMMONIA in the last 168 hours. Coagulation Profile: No results for input(s): INR, PROTIME in the last 168 hours. CBC: Recent Labs  Lab 09/17/19 1106 09/18/19 0136  WBC 12.1* 2.6*  NEUTROABS 10.2*  --   HGB 13.2 10.3*  HCT 46.2* 34.6*  MCV 87.2 84.4  PLT 222 144*   Cardiac Enzymes: No results for input(s): CKTOTAL, CKMB, CKMBINDEX, TROPONINI in the last 168 hours. BNP: Invalid input(s): POCBNP CBG: Recent Labs  Lab 09/17/19 1255  GLUCAP 140*    HbA1C: No results for input(s): HGBA1C in the last 72 hours. Urine analysis:    Component Value Date/Time   COLORURINE YELLOW 09/17/2019 [place] 09/17/2019 1435   LABSPEC 1.019 09/17/2019 1435   LABSPEC 1.030 08/17/2014 0834   PHURINE 7.0 09/17/2019 1435   GLUCOSEU NEGATIVE 09/17/2019 1435   GLUCOSEU Negative 08/17/2014 0834   HGBUR NEGATIVE 09/17/2019 1435   HGBUR negative 02/07/2009 1435   BILIRUBINUR NEGATIVE 09/17/2019 1435   BILIRUBINUR Negative 08/17/2014 [place] 09/17/2019 1435   PROTEINUR 30 (A) 09/17/2019 1435   UROBILINOGEN 1.0 12/20/2014 1306   UROBILINOGEN 0.2 08/17/2014 0834   NITRITE NEGATIVE 09/17/2019 1435   LEUKOCYTESUR NEGATIVE 09/17/2019 1435  LEUKOCYTESUR Trace 08/17/2014 0834   Sepsis Labs: _0 (procalcitonin:4,lacticidven:4) ) Recent Results (from the past 240 hour(s))  SARS Coronavirus 2 by RT PCR (hospital order, performed in [facility] hospital lab) Nasopharyngeal Throat     Status: Abnormal   Collection Time: 09/10/19 10:32 AM   Specimen: Throat; Nasopharyngeal  Result Value Ref Range Status   SARS Coronavirus 2 POSITIVE (A) NEGATIVE Final    Comment: RESULT CALLED TO, READ BACK BY AND VERIFIED WITH: J.STRENK AT 1601 ON 09/10/19 BY N.THOMPSON (NOTE) SARS-CoV-2 target nucleic acids are DETECTED  SARS-CoV-2 RNA is generally detectable in upper respiratory specimens  during the acute phase of infection.  Positive results are indicative  of the presence of the identified virus, but do not rule out bacterial infection or co-infection with other pathogens not detected by the test.  Clinical correlation with patient history and  other diagnostic information is necessary to determine patient infection status.  The expected result is negative.  Fact Sheet for Patients:   StrictlyIdeas.no   Fact Sheet for Healthcare Providers:   BankingDealers.co.za    This  test is not yet approved or cleared by the [place] FDA and  has been authorized for detection and/or diagnosis of SARS-CoV-2 by FDA under an Emergency Use Authorization (EUA).  This EUA will remain in effect (meaning t his test can be used) for the duration of  the COVID-19 declaration under Section 564(b)(1) of the Act, 21 U.S.C. section 360-bbb-3(b)(1), unless the authorization is terminated or revoked sooner.  Performed at [facility], [place] [address] [place], [place] [address]   Group A Strep by PCR     Status: None   Collection Time: 09/10/19 11:14 AM   Specimen: Throat; Sterile Swab  Result Value Ref Range Status   Group A Strep by PCR NOT DETECTED NOT DETECTED Final    Comment: Performed at [facility], [place] [address], [place], [place] [address]     Scheduled Meds: . enoxaparin (LOVENOX) injection  40 mg Subcutaneous Q24H  . magnesium oxide  400 mg Oral BID   Continuous Infusions: . 0.45 % NaCl with KCl 20 mEq / L 75 mL/hr at 09/18/19 1115  . levETIRAcetam Stopped (09/18/19 1050)    Procedures/Studies: DG Chest 2 View  Result Date: 09/10/2019 CLINICAL DATA:  Fever and cough EXAM: CHEST - 2 VIEW COMPARISON:  December 29, 2018, February 27, 2019 FINDINGS: The cardiomediastinal silhouette is normal in contour. LEFT chest port with tip terminating over the superior cavoatrial junction. No pleural effusion. No pneumothorax. No acute pleuroparenchymal abnormality. Visualized abdomen is unremarkable. No acute osseous abnormality noted. IMPRESSION: Clear lungs. Electronically Signed   By: Valentino Saxon MD   On: 09/10/2019 11:11   CT Head Wo Contrast  Result Date: 09/17/2019 CLINICAL DATA:  Seizure today.  COVID positive. EXAM: CT HEAD WITHOUT CONTRAST TECHNIQUE: Contiguous axial images were obtained from the base of the skull through the vertex without intravenous contrast. COMPARISON:  Head CT dated 04/09/2019. FINDINGS: Brain:  Ventricles are within normal limits in size. There is no mass, hemorrhage, edema or other evidence of acute parenchymal abnormality. No extra-axial hemorrhage. Vascular: No hyperdense vessel or unexpected calcification. Skull: Normal. Negative for fracture or focal lesion. Sinuses/Orbits: Small amount of layering fluid within the sphenoid sinuses. Mild mucosal thickening within the ethmoid air cells. Visualized periorbital and retro-orbital soft tissues are unremarkable. Other: None. IMPRESSION: 1. No acute intracranial abnormality. No intracranial mass, hemorrhage or edema. 2. Mild sphenoid and  ethmoid sinus disease, of uncertain age. Electronically Signed   By: Franki Cabot M.D.   On: 09/17/2019 12:44   MR BRAIN WO CONTRAST  Result Date: 09/17/2019 CLINICAL DATA:  Metastatic lung cancer, seizure EXAM: MRI HEAD WITHOUT CONTRAST TECHNIQUE: Multiplanar, multiecho pulse sequences of the brain and surrounding structures were obtained without intravenous contrast. COMPARISON:  08/04/2019 FINDINGS: DWI, sagittal T1, and axial T2 sequences were obtained. Patient could not tolerate remainder of the study including postcontrast imaging. Motion artifact is present. Brain: There is no acute infarction. There is no mass lesion, mass effect, or edema identified. Ventricles are normal in size. Vascular: Major vessel flow voids at the skull base are preserved. Skull and upper cervical spine: Normal marrow signal is preserved. Sinuses/Orbits: Mild mucosal thickening.  Orbits are unremarkable. Other: Sella is unremarkable.  Mastoid air cells are clear. IMPRESSION: Partial study without contrast.  No evidence of metastatic disease. Electronically Signed   By: Macy Mis M.D.   On: 09/17/2019 18:35   MR Brain W Wo Contrast  Result Date: 09/03/2019 CLINICAL DATA:  44 year old female with metastatic colon cancer. Seizure. EXAM: MRI HEAD WITHOUT AND WITH CONTRAST TECHNIQUE: Multiplanar, multiecho pulse sequences of the  brain and surrounding structures were obtained without and with intravenous contrast. CONTRAST:  78m GADAVIST GADOBUTROL 1 MMOL/ML IV SOLN COMPARISON:  Head CT 04/10/2019.  Brain MRI 09/10/2008. FINDINGS: Brain: No abnormal enhancement identified. No midline shift, mass effect, or evidence of intracranial mass lesion. No dural thickening. No restricted diffusion to suggest acute infarction. No ventriculomegaly, extra-axial collection or acute intracranial hemorrhage. Cervicomedullary junction and pituitary are within normal limits. GPearline Cablesand white matter signal appears stable and normal throughout the brain. No encephalomalacia or chronic cerebral blood products identified. On routine coronal T2 imaging the hippocampal formations appear symmetric and within normal limits. No migrational abnormality identified. Vascular: Major intracranial vascular flow voids are stable since 2010. Dominant left vertebral artery. The major dural venous sinuses are enhancing and appear to be patent. Skull and upper cervical spine: Negative visible cervical spine. Mildly diminished T1 marrow signal diffusely. No destructive osseous lesion identified. Sinuses/Orbits: Negative orbits. Paranasal Visualized paranasal sinuses and mastoids are stable and well pneumatized. Other: Visible internal auditory structures appear normal. Scalp and face soft tissues appear negative. IMPRESSION: 1. Stable since 2010 and normal MRI appearance of the brain. No cerebral metastatic disease identified. 2. Diffusely decreased bone marrow signal. Favor benign etiology such as red marrow reactivation in the setting of cancer treatment. But osseous metastatic disease is difficult to exclude. Electronically Signed   By: HGenevie AnnM.D.   On: 09/03/2019 15:56   EEG adult  Result Date: 09/18/2019 YLora Havens MD     09/18/2019  1:17 PM Patient Name: RSAVREEN GEBHARDTMRN: [id number] Attending: PLora HavensReferring Physician/Provider: Dr DShanon BrowTat  Date: 09/18/2019 Duration: 26.17 mins Patient history: 89yoF with h/o epilepsy presented with breakthrough seizures. EEG to evaluate for seizure. Level of alertness: Awake, asleep AEDs during EEG study: LEV Technical aspects: This EEG study was done with scalp electrodes positioned according to the 10-20 International system of electrode placement. Electrical activity was acquired at a sampling rate of _0  and reviewed with a high frequency filter of _1  and a low frequency filter of _2 . EEG data were recorded continuously and digitally stored. Description: The posterior dominant rhythm consists of 8 Hz activity of moderate voltage (25-35 uV) seen predominantly in posterior head regions, symmetric and reactive to eye opening  and eye closing. Sleep was characterized by vertex waves, sleep spindles (12 to 14 Hz), maximal frontocentral region.  Abundant generalized spike and wave as well as polyspike and waves were seen lasting 15-20 seconds without clinical signs ( Patient was able to repeat "blue dog" during the discharges). There was also lead in left frontotemporal region noted twice before run of spikes/polyspikes.  Hyperventilation and photic stimulation were not performed.   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 Patient was able to repeat during long run of spikes/polyspikes. Therefore, if patient is at baseline, then this is most likely patient's baseline EEG. However, if patient is not at baseline, consider long term monitoring/ adjustment of AEDs. Dr Merlene Laughter was notified. [place], DO  [facility]  If 7PM-7AM, please contact night-coverage [email or web address] Password [facility] 09/18/2019, 2:36 PM   LOS: 0 days

## 2019-09-18 NOTE — TOC Progression Note (Addendum)
Transition of Care [facility]) - Progression Note    Patient Details  Name: [name] MRN: [id number] Date of Birth: 1975/08/27  [facility]) CM/SW Contact  Natasha Bence, LCSW Phone Number: 09/18/2019, 4:20 PM  Clinical Narrative:    CSW inquired about patient's choice for Out Patient Physical Therapy. Patient's spouse reported that they are agreeable to [facility] rehab. CSW referred patient to [place] [facility] rehab. TOC to follow.       Expected Discharge Plan and Services                                                 Social Determinants of Health (SDOH) Interventions    Readmission Risk Interventions No flowsheet data found.

## 2019-09-18 NOTE — Plan of Care (Signed)
°  Problem: Acute Rehab PT Goals(only PT should resolve) Goal: Patient Will Transfer Sit To/From Stand Outcome: Progressing Flowsheets (Taken 09/18/2019 1522) Patient will transfer sit to/from stand: with modified independence Goal: Pt Will Transfer Bed To Chair/Chair To Bed Outcome: Progressing Flowsheets (Taken 09/18/2019 1522) Pt will Transfer Bed to Chair/Chair to Bed: with modified independence Goal: Pt Will Ambulate Outcome: Progressing Flowsheets (Taken 09/18/2019 1522) Pt will Ambulate:  25 feet  with least restrictive assistive device  with min guard assist Goal: Pt/caregiver will Perform Home Exercise Program Outcome: Progressing Flowsheets (Taken 09/18/2019 1522) Pt/caregiver will Perform Home Exercise Program:  For increased strengthening  For improved balance  Independently    3:23 PM, 09/18/19 Mearl Latin PT, DPT Physical Therapist at [facility]

## 2019-09-18 NOTE — Procedures (Signed)
Patient Name: [name]  MRN: [id number]  Epilepsy Attending: Lora Havens  Referring Physician/Provider: Dr Shanon Brow Tat Date: 09/18/2019 Duration: 26.17 mins  Patient history: 44yo F with h/o epilepsy presented with breakthrough seizures. EEG to evaluate for seizure.   Level of alertness: Awake, asleep  AEDs during EEG study: LEV  Technical aspects: This EEG study was done with scalp electrodes positioned according to the 10-20 International system of electrode placement. Electrical activity was acquired at a sampling rate of 500Hz  and reviewed with a high frequency filter of 70Hz  and a low frequency filter of 1Hz . EEG data were recorded continuously and digitally stored.   Description: The posterior dominant rhythm consists of 8 Hz activity of moderate voltage (25-35 uV) seen predominantly in posterior head regions, symmetric and reactive to eye opening and eye closing. Sleep was characterized by vertex waves, sleep spindles (12 to 14 Hz), maximal frontocentral region.  Abundant generalized spike and wave as well as polyspike and waves were seen lasting 15-20 seconds without clinical signs ( Patient was able to repeat "blue dog" during the discharges). There was also lead in left frontotemporal region noted twice before run of spikes/polyspikes.  Hyperventilation and photic stimulation were not performed.     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 Patient was able to repeat during long run of spikes/polyspikes. Therefore, if patient is at baseline, then this is most likely patient's baseline EEG. However, if patient is not at baseline, consider long term monitoring/ adjustment of AEDs.   Dr Merlene Laughter was notified.  Mikenzie Mccannon Barbra Sarks

## 2019-09-18 NOTE — Evaluation (Signed)
Physical Therapy Evaluation Patient Details Name: [name] MRN: [id number] DOB: Nov 07, 1975 Today's Date: 09/18/2019   History of Present Illness  [name] is a 44 y.o. female with medical history of metastatic colon cancer with peritoneal dissemination on current chemotherapy supervised by her oncologist, Dr.Sherrill.  The patient also has a medical history of iron deficiency anemia, seizure disorder, hyperlipidemia, GERD, and enterocutaneous fistula.  She presented after a seizure at home on the morning of 09/17/2019.  Her spouse walked into her room to check up on her in the morning and noted the patient to be lethargic slumped over.  The patient did awaken but was slow to respond.  As result, EMS was activated.  Notably, the patient was diagnosed with COVID-19 on 09/10/2019.  She had high fevers up to 102 F at that time with a cough and chest congestion.  She was instructed by her oncologist to get a Covid test at that time.  Fortunately, patient has been fairly asymptomatic from a pulmonary standpoint regarding her COVID-19 infection.  She has not had any shortness of breath or worsening cough.  She has not had any nausea, vomiting.  She has chronic loose stools.  She has not been on any new medications.  Her last chemotherapy was approximately 3 weeks prior to this admission.  There have been no reports of headache, visual disturbance, chest pain, hemoptysis, hematochezia, melena, abdominal pain.  History per the patient's spouse reveals that the patient had a seizure approximately in December 2020.  She was started on Keppra at that time.  Prior to the seizure, patient has not had any seizures for over 7 years.     Clinical Impression  Patient limited for functional mobility as stated below secondary to BLE weakness, fatigue and poor standing balance. Patient does not require assist for bed mobility but does demonstrate impaired balance upon transfer to standing. She requires guard assist  for balance and safety while standing and ambulating today. She fatigues quickly with ambulation today. Patient ambulates to and from [facility] in room with assist. Patient ended session seated on [facility] - nursing aware. Patient will benefit from continued physical therapy in hospital and recommended venue below to increase strength, balance, endurance for safe ADLs and gait.     Follow Up Recommendations Outpatient PT    Equipment Recommendations  Rolling walker with 5" wheels    Recommendations for Other Services       Precautions / Restrictions Precautions Precautions: Fall Restrictions Weight Bearing Restrictions: No      Mobility  Bed Mobility Overal bed mobility: Modified Independent             General bed mobility comments: slightly slow, labored  Transfers Overall transfer level: Needs assistance Equipment used: None Transfers: Sit to/from Stand;Stand Pivot Transfers Sit to Stand: Min guard Stand pivot transfers: Min guard       General transfer comment: slight unsteadiness upon standing  Ambulation/Gait Ambulation/Gait assistance: Min guard Gait Distance (Feet): 15 Feet Assistive device: None Gait Pattern/deviations: Step-through pattern;Decreased step length - right;Decreased step length - left;Decreased stride length Gait velocity: decreased   General Gait Details: Ambulates with slow, labored gait in room, fatigues quickly, several stumbles without AD; guard for safety/balance  Stairs            Wheelchair Mobility    Modified Rankin (Stroke Patients Only)       Balance Overall balance assessment: Needs assistance Sitting-balance support: No upper extremity supported Sitting balance-Leahy Scale: Good  Sitting balance - Comments: seated EOB   Standing balance support: No upper extremity supported Standing balance-Leahy Scale: Fair Standing balance comment: without AD                             Pertinent Vitals/Pain Pain  Assessment: No/denies pain    Home Living Family/patient expects to be discharged to:: Private residence Living Arrangements: Spouse/significant other Available Help at Discharge: Family;Available 24 hours/day Type of Home: House Home Access: Stairs to enter Entrance Stairs-Rails: None Entrance Stairs-Number of Steps: 2 Home Layout: One level Home Equipment: None      Prior Function Level of Independence: Independent         Comments: Patient states independent without AD, community ambulator and able to complete ADL without assist     Hand Dominance        Extremity/Trunk Assessment   Upper Extremity Assessment Upper Extremity Assessment: Overall WFL for tasks assessed    Lower Extremity Assessment Lower Extremity Assessment: Overall WFL for tasks assessed;Generalized weakness       Communication   Communication: No difficulties  Cognition Arousal/Alertness: Awake/alert Behavior During Therapy: WFL for tasks assessed/performed Overall Cognitive Status: Within Functional Limits for tasks assessed                                        General Comments      Exercises     Assessment/Plan    PT Assessment Patient needs continued PT services  PT Problem List Decreased strength;Decreased activity tolerance;Decreased balance;Decreased mobility;Decreased knowledge of use of DME       PT Treatment Interventions DME instruction;Gait training;Stair training;Functional mobility training;Therapeutic activities;Therapeutic exercise;Balance training;Neuromuscular re-education;Patient/family education    PT Goals (Current goals can be found in the Care Plan section)  Acute Rehab PT Goals Patient Stated Goal: Return home with family to assist PT Goal Formulation: With patient Time For Goal Achievement: 10/02/19 Potential to Achieve Goals: Good    Frequency Min 2X/week   Barriers to discharge        Co-evaluation               AM-PAC  PT "6 Clicks" Mobility  Outcome Measure Help needed turning from your back to your side while in a flat bed without using bedrails?: None Help needed moving from lying on your back to sitting on the side of a flat bed without using bedrails?: None Help needed moving to and from a bed to a chair (including a wheelchair)?: A Little Help needed standing up from a chair using your arms (e.g., wheelchair or bedside chair)?: A Little Help needed to walk in hospital room?: A Little Help needed climbing 3-5 steps with a railing? : A Lot 6 Click Score: 19    End of Session   Activity Tolerance: Patient tolerated treatment well;Patient limited by fatigue Patient left: Other (comment) (BSC with nursing aware) Nurse Communication: Mobility status;Other (comment) (Return patient to bed, patient seated on BSC at bedside) PT Visit Diagnosis: Unsteadiness on feet (R26.81);Other abnormalities of gait and mobility (R26.89);Muscle weakness (generalized) (M62.81);History of falling (Z91.81)    Time: 1436-1500 PT Time Calculation (min) (ACUTE ONLY): 24 min   Charges:   PT Evaluation $PT Eval Moderate Complexity: 1 Mod PT Treatments $Therapeutic Activity: 8-22 mins        3:21 PM, 09/18/19 Mearl Latin PT,  DPT Physical Therapist at [facility]

## 2019-09-18 NOTE — ED Notes (Signed)
Pt in for evaluation and treatment.  Pt ambulated in room.  Tolerated well.  Pt placed in hospital bed and bedside commode placed at bedside.

## 2019-09-18 NOTE — ED Notes (Signed)
Tech Anderson [place]) in to perform EEG.

## 2019-09-18 NOTE — Progress Notes (Signed)
EEG complete - results pending 

## 2019-09-19 ENCOUNTER — Other Ambulatory Visit: Payer: Self-pay | Admitting: Oncology

## 2019-09-19 DIAGNOSIS — C787 Secondary malignant neoplasm of liver and intrahepatic bile duct: Secondary | ICD-10-CM

## 2019-09-19 LAB — BASIC METABOLIC PANEL
Anion gap: 9 (ref 5–15)
BUN: 8 mg/dL (ref 6–20)
CO2: 25 mmol/L (ref 22–32)
Calcium: 8.7 mg/dL — ABNORMAL LOW (ref 8.9–10.3)
Chloride: 109 mmol/L (ref 98–111)
Creatinine, Ser: 0.45 mg/dL (ref 0.44–1.00)
GFR calc Af Amer: >60 mL/min (ref >60)
GFR calc non Af Amer: >60 mL/min (ref >60)
Glucose, Bld: 111 mg/dL — ABNORMAL HIGH (ref 70–99)
Potassium: 3.5 mmol/L (ref 3.5–5.1)
Sodium: 143 mmol/L (ref 135–145)

## 2019-09-19 LAB — FOLATE: Folate: 27 ng/mL (ref >5.9)

## 2019-09-19 LAB — CBC WITH DIFFERENTIAL/PLATELET
Abs Immature Granulocytes: 0.02 10*3/uL (ref 0.00–0.07)
Basophils Absolute: 0 10*3/uL (ref 0.0–0.1)
Basophils Relative: 0 %
Eosinophils Absolute: 0.1 10*3/uL (ref 0.0–0.5)
Eosinophils Relative: 1 %
HCT: 34.4 % — ABNORMAL LOW (ref 36.0–46.0)
Hemoglobin: 10.3 g/dL — ABNORMAL LOW (ref 12.0–15.0)
Immature Granulocytes: 1 %
Lymphocytes Relative: 21 %
Lymphs Abs: 0.7 10*3/uL (ref 0.7–4.0)
MCH: 24.8 pg — ABNORMAL LOW (ref 26.0–34.0)
MCHC: 29.9 g/dL — ABNORMAL LOW (ref 30.0–36.0)
MCV: 82.9 fL (ref 80.0–100.0)
Monocytes Absolute: 0.3 10*3/uL (ref 0.1–1.0)
Monocytes Relative: 7 %
Neutro Abs: 2.4 10*3/uL (ref 1.7–7.7)
Neutrophils Relative %: 70 %
Platelets: 153 10*3/uL (ref 150–400)
RBC: 4.15 MIL/uL (ref 3.87–5.11)
RDW: 17 % — ABNORMAL HIGH (ref 11.5–15.5)
WBC: 3.5 10*3/uL — ABNORMAL LOW (ref 4.0–10.5)
nRBC: 0 % (ref 0.0–0.2)

## 2019-09-19 LAB — VITAMIN B12: Vitamin B-12: 1085 pg/mL — ABNORMAL HIGH (ref 180–914)

## 2019-09-19 LAB — MAGNESIUM: Magnesium: 1.2 mg/dL — ABNORMAL LOW (ref 1.7–2.4)

## 2019-09-19 MED ORDER — LEVETIRACETAM 500 MG PO TABS: 1500.0000 mg | ORAL_TABLET | Freq: Two times a day (BID) | ORAL | Status: DC

## 2019-09-19 MED ORDER — MAGNESIUM OXIDE 400 (241.3 MG) MG PO TABS: 400.0000 mg | ORAL_TABLET | Freq: Two times a day (BID) | ORAL | 2 refills | Status: DC

## 2019-09-19 MED ORDER — MAGNESIUM SULFATE 2 GM/50ML IV SOLN
2.0000 g | Freq: Once | INTRAVENOUS | Status: AC
  Administered 2019-09-19: 2 g via INTRAVENOUS
  Filled 2019-09-19: qty 50

## 2019-09-19 MED ORDER — LEVETIRACETAM 750 MG PO TABS: 1500.0000 mg | ORAL_TABLET | Freq: Two times a day (BID) | ORAL | 2 refills | Status: DC

## 2019-09-19 NOTE — Progress Notes (Signed)
Nsg Discharge Note  Admit Date:  09/17/2019 Discharge date: 09/19/2019   [name] to be D/C'd Home per MD order.  AVS completed.  Copy for chart, and copy for patient signed, and dated. Patient/caregiver able to verbalize understanding. Both IV removed. Patient discharge paperwork given and reviewed with patient. Patient had no questions.   Discharge Medication: Allergies as of 09/19/2019      Reactions   Adhesive [tape] Hives, Shortness Of Breath, Swelling   Tolerates paper tape   Clindamycin/lincomycin Itching, Rash   Generalized body rash with itching, lip ulcers   Promethazine Other (See Comments)   Increase restless leg movement    Eggs Or Egg-derived Products Nausea And Vomiting   Metoclopramide Hcl Other (See Comments)   EXACERBATES RESTLESS LEG SYNDROME   Oxycodone-acetaminophen Hives   Sulfa Antibiotics Nausea And Vomiting   Levaquin [levofloxacin]    seizures   Penicillins Itching   Has patient had a PCN reaction causing immediate rash, facial/tongue/throat swelling, SOB or lightheadedness with hypotension: No Has patient had a PCN reaction causing severe rash involving mucus membranes or skin necrosis: No Has patient had a PCN reaction that required hospitalization No Has patient had a PCN reaction occurring within the last 10 years: No If all of the above answers are "NO", then may proceed with Cephalosporin use.      Medication List    STOP taking these medications   doxycycline 100 MG tablet Commonly known as: VIBRA-TABS   lidocaine-prilocaine cream Commonly known as: EMLA   nystatin powder Commonly known as: MYCOSTATIN/NYSTOP   prochlorperazine 10 MG tablet Commonly known as: COMPAZINE     TAKE these medications   acetaminophen 500 MG tablet Commonly known as: TYLENOL Take 1,000 mg by mouth every 6 (six) hours as needed.   alum & mag hydroxide-simeth 200-200-20 MG/5ML suspension Commonly known as: MAALOX/MYLANTA Take 30 mLs by mouth every 6  (six) hours as needed for indigestion or heartburn.   levETIRAcetam 750 MG tablet Commonly known as: KEPPRA Take 2 tablets (1,500 mg total) by mouth 2 (two) times daily. What changed:   medication strength  how much to take   LORazepam 0.5 MG tablet Commonly known as: ATIVAN Take 1 tablet (0.5 mg total) by mouth 2 (two) times daily as needed for anxiety.   magnesium oxide 400 (241.3 Mg) MG tablet Commonly known as: MAG-OX Take 1 tablet (400 mg total) by mouth 2 (two) times daily.       Discharge Assessment: Vitals:   09/19/19 0000 09/19/19 0400  BP: 99/62 108/72  Pulse: 67 67  Resp: 18 16  Temp: 98.1 F (36.7 C) 97.7 F (36.5 C)  SpO2: 100% 100%   Skin clean, dry and intact without evidence of skin break down, no evidence of skin tears noted. IV catheter discontinued intact. Site without signs and symptoms of complications - no redness or edema noted at insertion site, patient denies c/o pain - only slight tenderness at site.  Dressing with slight pressure applied.  D/c Instructions-Education: Discharge instructions given to patient/family with verbalized understanding. D/c education completed with patient/family including follow up instructions, medication list, d/c activities limitations if indicated, with other d/c instructions as indicated by MD - patient able to verbalize understanding, all questions fully answered. Patient instructed to return to ED, call 911, or call MD for any changes in condition.  Patient escorted via [place], and D/C home via private auto.  Zenaida Deed, RN 09/19/2019 1:06 PM

## 2019-09-19 NOTE — Discharge Summary (Signed)
Physician Discharge Summary  [name] QVZ:[id number] DOB: [birth year] DOA: 09/17/2019  PCP: [name], MD  Admit date: 09/17/2019 Discharge date: 09/19/2019  Admitted From: Home Disposition:  Home   Recommendations for Outpatient Follow-up:  1. Follow up with PCP in 1-2 weeks 2. Please obtain BMP/CBC in one week     Discharge Condition: Stable CODE STATUS: FULL Diet recommendation: Regular   Brief/Interim Summary: 44 y.o.femalewith medical history ofmetastatic colon cancer with peritoneal dissemination on current chemotherapy supervised by her oncologist, Dr.Sherrill.The patient also has a medical history of iron deficiency anemia, seizure disorder, hyperlipidemia, GERD, and enterocutaneous fistula. She presented after a seizure at home on the morning of 09/17/2019. Her spouse walked into her room to check up on her in the morning and noted the patient to be lethargic slumped over. The patient did awaken but was slow to respond. As result, EMS was activated. Notably, the patient was diagnosed with COVID-19 on8/06/2019. She had high fevers up to 102 F at that time with a cough and chest congestion. She was instructed by her oncologist to get a Covid test at that time. Fortunately, patient has been fairly asymptomatic from a pulmonary standpoint regarding her COVID-19 infection. She has not had any shortness of breath or worsening cough. She has not had any nausea, vomiting. She has chronic loose stools. She has not been on any new medications. Her last chemotherapy was approximately 3 weeks prior to this admission. There have been no reports of headache, visual disturbance, chest pain, hemoptysis, hematochezia, melena, abdominal pain. History per the patient's spouse reveals that the patient had a seizure approximately in December 2020. She was started on Keppra at that time. Prior to the seizure, patient has not had any seizures for over 7 years. During her  time in the emergency department, the patient had another tonic-clonic type episode lasting 2 minutes. She was given Valium. At the time of my evaluation, patient is postictal. She awakens and intermittently answers, but is somnolent. She is afebrile and hemodynamically stable with oxygen saturation 99% on room air. BMP was unremarkable. AST 63, ALT 49, alk phosphatase 107, total bilirubin 0.6. WBC 12.1, hemoglobin 13.2, glucose 222,000. CT of the brain was negative for acute findings. Neurology was consulted to assist with management. She was loaded with Keppra 1000 mg in the emergency department.  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 Benay Spice -last chemo 3 weeks prior to admission  COVID-19 Infection -she is currently stable on RA without fever or dyspnea -monitor inflammatory markers -patient received Regen CoV on 09/12/19  Hypernatremia -Continue 1/2NS with KCL  Hypomagnesemia/hypokalemia -repleted IV -increase po mag to bid  Thrombocytopenia -chronic -am CBC -monitor for signs of bleed  Goals of Care -full code -palliative medicine consulted    Discharge Instructions  Discharge Instructions    Ambulatory referral to Physical Therapy   Complete by: As directed      Allergies as of 09/19/2019      Reactions   Adhesive [tape] Hives, Shortness Of Breath, Swelling   Tolerates paper tape   Clindamycin/lincomycin Itching, Rash   Generalized body rash with itching, lip ulcers   Promethazine Other (See Comments)   Increase restless leg movement    Eggs Or Egg-derived Products Nausea  And Vomiting   Metoclopramide Hcl Other (See  Comments)   EXACERBATES RESTLESS LEG SYNDROME   Oxycodone-acetaminophen Hives   Sulfa Antibiotics Nausea And Vomiting   Levaquin [levofloxacin]    seizures   Penicillins Itching   Has patient had a PCN reaction causing immediate rash, facial/tongue/throat swelling, SOB or lightheadedness with hypotension: No Has patient had a PCN reaction causing severe rash involving mucus membranes or skin necrosis: No Has patient had a PCN reaction that required hospitalization No Has patient had a PCN reaction occurring within the last 10 years: No If all of the above answers are "NO", then may proceed with Cephalosporin use.      Medication List    STOP taking these medications   doxycycline 100 MG tablet Commonly known as: VIBRA-TABS   lidocaine-prilocaine cream Commonly known as: EMLA   nystatin powder Commonly known as: MYCOSTATIN/NYSTOP   prochlorperazine 10 MG tablet Commonly known as: COMPAZINE     TAKE these medications   acetaminophen 500 MG tablet Commonly known as: TYLENOL Take 1,000 mg by mouth every 6 (six) hours as needed.   alum & mag hydroxide-simeth 200-200-20 MG/5ML suspension Commonly known as: MAALOX/MYLANTA Take 30 mLs by mouth every 6 (six) hours as needed for indigestion or heartburn.   levETIRAcetam 750 MG tablet Commonly known as: KEPPRA Take 2 tablets (1,500 mg total) by mouth 2 (two) times daily. What changed:   medication strength  how much to take   LORazepam 0.5 MG tablet Commonly known as: ATIVAN Take 1 tablet (0.5 mg total) by mouth 2 (two) times daily as needed for anxiety.   magnesium oxide 400 (241.3 Mg) MG tablet Commonly known as: MAG-OX Take 1 tablet (400 mg total) by mouth 2 (two) times daily.       Follow-up Information    [facility] Follow up.   Specialty: Rehabilitation Why: OPPT Contact information: [place] [phone] [place] [address] [phone]              Allergies  Allergen Reactions  . Adhesive [Tape] Hives, Shortness Of Breath and Swelling    Tolerates paper tape  . Clindamycin/Lincomycin Itching and Rash    Generalized body rash with itching, lip ulcers  . Promethazine Other (See Comments)    Increase restless leg movement   . Eggs Or Egg-Derived Products Nausea And Vomiting  . Metoclopramide Hcl Other (See Comments)    EXACERBATES RESTLESS LEG SYNDROME  . Oxycodone-Acetaminophen Hives  . Sulfa Antibiotics Nausea And Vomiting  . Levaquin [Levofloxacin]     seizures  . Penicillins Itching    Has patient had a PCN reaction causing immediate rash, facial/tongue/throat swelling, SOB or lightheadedness with hypotension: No Has patient had a PCN reaction causing severe rash involving mucus membranes or skin necrosis: No Has patient had a PCN reaction that required hospitalization No Has patient had a PCN reaction occurring within the last 10 years: No If all of the above answers are "NO", then may proceed with Cephalosporin use.     Consultations:  neurology   Procedures/Studies: DG Chest 2 View  Result Date: 09/10/2019 CLINICAL DATA:  Fever and cough EXAM: CHEST - 2 VIEW COMPARISON:  December 29, 2018, February 27, 2019 FINDINGS: The cardiomediastinal silhouette is normal in contour. LEFT chest port with tip terminating over the superior cavoatrial junction. No pleural effusion. No pneumothorax. No acute pleuroparenchymal abnormality. Visualized abdomen is unremarkable. No acute osseous abnormality noted. IMPRESSION: Clear lungs. Electronically  Signed   By: Valentino Saxon MD   On: 09/10/2019 11:11   CT Head Wo Contrast  Result Date: 09/17/2019 CLINICAL DATA:  Seizure today.  COVID positive. EXAM: CT HEAD WITHOUT CONTRAST TECHNIQUE: Contiguous axial images were obtained from the base of the skull through the vertex without intravenous contrast. COMPARISON:  Head CT dated 04/09/2019. FINDINGS: Brain: Ventricles are  within normal limits in size. There is no mass, hemorrhage, edema or other evidence of acute parenchymal abnormality. No extra-axial hemorrhage. Vascular: No hyperdense vessel or unexpected calcification. Skull: Normal. Negative for fracture or focal lesion. Sinuses/Orbits: Small amount of layering fluid within the sphenoid sinuses. Mild mucosal thickening within the ethmoid air cells. Visualized periorbital and retro-orbital soft tissues are unremarkable. Other: None. IMPRESSION: 1. No acute intracranial abnormality. No intracranial mass, hemorrhage or edema. 2. Mild sphenoid and ethmoid sinus disease, of uncertain age. Electronically Signed   By: Franki Cabot M.D.   On: 09/17/2019 12:44   MR BRAIN WO CONTRAST  Result Date: 09/17/2019 CLINICAL DATA:  Metastatic lung cancer, seizure EXAM: MRI HEAD WITHOUT CONTRAST TECHNIQUE: Multiplanar, multiecho pulse sequences of the brain and surrounding structures were obtained without intravenous contrast. COMPARISON:  08/04/2019 FINDINGS: DWI, sagittal T1, and axial T2 sequences were obtained. Patient could not tolerate remainder of the study including postcontrast imaging. Motion artifact is present. Brain: There is no acute infarction. There is no mass lesion, mass effect, or edema identified. Ventricles are normal in size. Vascular: Major vessel flow voids at the skull base are preserved. Skull and upper cervical spine: Normal marrow signal is preserved. Sinuses/Orbits: Mild mucosal thickening.  Orbits are unremarkable. Other: Sella is unremarkable.  Mastoid air cells are clear. IMPRESSION: Partial study without contrast.  No evidence of metastatic disease. Electronically Signed   By: Macy Mis M.D.   On: 09/17/2019 18:35   MR Brain W Wo Contrast  Result Date: 09/03/2019 CLINICAL DATA:  44 year old female with metastatic colon cancer. Seizure. EXAM: MRI HEAD WITHOUT AND WITH CONTRAST TECHNIQUE: Multiplanar, multiecho pulse sequences of the brain and  surrounding structures were obtained without and with intravenous contrast. CONTRAST:  68m GADAVIST GADOBUTROL 1 MMOL/ML IV SOLN COMPARISON:  Head CT 04/10/2019.  Brain MRI 09/10/2008. FINDINGS: Brain: No abnormal enhancement identified. No midline shift, mass effect, or evidence of intracranial mass lesion. No dural thickening. No restricted diffusion to suggest acute infarction. No ventriculomegaly, extra-axial collection or acute intracranial hemorrhage. Cervicomedullary junction and pituitary are within normal limits. GPearline Cablesand white matter signal appears stable and normal throughout the brain. No encephalomalacia or chronic cerebral blood products identified. On routine coronal T2 imaging the hippocampal formations appear symmetric and within normal limits. No migrational abnormality identified. Vascular: Major intracranial vascular flow voids are stable since 2010. Dominant left vertebral artery. The major dural venous sinuses are enhancing and appear to be patent. Skull and upper cervical spine: Negative visible cervical spine. Mildly diminished T1 marrow signal diffusely. No destructive osseous lesion identified. Sinuses/Orbits: Negative orbits. Paranasal Visualized paranasal sinuses and mastoids are stable and well pneumatized. Other: Visible internal auditory structures appear normal. Scalp and face soft tissues appear negative. IMPRESSION: 1. Stable since 2010 and normal MRI appearance of the brain. No cerebral metastatic disease identified. 2. Diffusely decreased bone marrow signal. Favor benign etiology such as red marrow reactivation in the setting of cancer treatment. But osseous metastatic disease is difficult to exclude. Electronically Signed   By: HGenevie AnnM.D.   On: 09/03/2019 15:56   EEG adult  Result Date: 09/18/2019 Lora Havens, MD     09/18/2019  1:17 PM Patient Name: [name] MRN: [id number] Epilepsy Attending: Lora Havens Referring Physician/Provider: Dr Shanon Brow Kadia Abaya Date:  09/18/2019 Duration: 26.17 mins Patient history: 44yo F with h/o epilepsy presented with breakthrough seizures. EEG to evaluate for seizure. Level of alertness: Awake, asleep AEDs during EEG study: LEV Technical aspects: This EEG study was done with scalp electrodes positioned according to the 10-20 International system of electrode placement. Electrical activity was acquired at a sampling rate of 500Hz  and reviewed with a high frequency filter of 70Hz  and a low frequency filter of 1Hz . EEG data were recorded continuously and digitally stored. Description: The posterior dominant rhythm consists of 8 Hz activity of moderate voltage (25-35 uV) seen predominantly in posterior head regions, symmetric and reactive to eye opening and eye closing. Sleep was characterized by vertex waves, sleep spindles (12 to 14 Hz), maximal frontocentral region.  Abundant generalized spike and wave as well as polyspike and waves were seen lasting 15-20 seconds without clinical signs ( Patient was able to repeat "blue dog" during the discharges). There was also lead in left frontotemporal region noted twice before run of spikes/polyspikes.  Hyperventilation and photic stimulation were not performed.   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 Patient was able to repeat during long run of spikes/polyspikes. Therefore, if patient is at baseline, then this is most likely patient's baseline EEG. However, if patient is not at baseline, consider long term monitoring/ adjustment of AEDs. Dr Merlene Laughter was notified. Lora Havens         Discharge Exam: Vitals:   09/19/19 0000 09/19/19 0400  BP: 99/62 108/72  Pulse: 67 67  Resp: 18 16  Temp: 98.1 F (36.7 C) 97.7 F (36.5 C)  SpO2: 100% 100%   Vitals:   09/18/19 2020 09/18/19 2126 09/19/19 0000 09/19/19 0400  BP: 107/66 121/75 99/62 108/72   Pulse: 77 74 67 67  Resp: 15 18 18 16   Temp: 98.3 F (36.8 C) 99.2 F (37.3 C) 98.1 F (36.7 C) 97.7 F (36.5 C)  TempSrc: Oral Oral Oral Oral  SpO2: 100% 100% 100% 100%  Weight:      Height:        General: Pt is alert, awake, not in acute distress Cardiovascular: RRR, S1/S2 +, no rubs, no gallops Respiratory: fine bibasilar crackles. No wheeze Abdominal: Soft, NT, ND, bowel sounds + Extremities: no edema, no cyanosis   The results of significant diagnostics from this hospitalization (including imaging, microbiology, ancillary and laboratory) are listed below for reference.    Significant Diagnostic Studies: DG Chest 2 View  Result Date: 09/10/2019 CLINICAL DATA:  Fever and cough EXAM: CHEST - 2 VIEW COMPARISON:  December 29, 2018, February 27, 2019 FINDINGS: The cardiomediastinal silhouette is normal in contour. LEFT chest port with tip terminating over the superior cavoatrial junction. No pleural effusion. No pneumothorax. No acute pleuroparenchymal abnormality. Visualized abdomen is unremarkable. No acute osseous abnormality noted. IMPRESSION: Clear lungs. Electronically Signed   By: Valentino Saxon MD   On: 09/10/2019 11:11   CT Head Wo Contrast  Result Date: 09/17/2019 CLINICAL DATA:  Seizure today.  COVID positive. EXAM: CT HEAD WITHOUT CONTRAST TECHNIQUE: Contiguous axial images were obtained from the base of the skull through the vertex without intravenous contrast. COMPARISON:  Head CT dated 04/09/2019. FINDINGS: Brain: Ventricles are  within normal limits in size. There is no mass, hemorrhage, edema or other evidence of acute parenchymal abnormality. No extra-axial hemorrhage. Vascular: No hyperdense vessel or unexpected calcification. Skull: Normal. Negative for fracture or focal lesion. Sinuses/Orbits: Small amount of layering fluid within the sphenoid sinuses. Mild mucosal thickening within the ethmoid air cells. Visualized periorbital and retro-orbital soft tissues are  unremarkable. Other: None. IMPRESSION: 1. No acute intracranial abnormality. No intracranial mass, hemorrhage or edema. 2. Mild sphenoid and ethmoid sinus disease, of uncertain age. Electronically Signed   By: Franki Cabot M.D.   On: 09/17/2019 12:44   MR BRAIN WO CONTRAST  Result Date: 09/17/2019 CLINICAL DATA:  Metastatic lung cancer, seizure EXAM: MRI HEAD WITHOUT CONTRAST TECHNIQUE: Multiplanar, multiecho pulse sequences of the brain and surrounding structures were obtained without intravenous contrast. COMPARISON:  08/04/2019 FINDINGS: DWI, sagittal T1, and axial T2 sequences were obtained. Patient could not tolerate remainder of the study including postcontrast imaging. Motion artifact is present. Brain: There is no acute infarction. There is no mass lesion, mass effect, or edema identified. Ventricles are normal in size. Vascular: Major vessel flow voids at the skull base are preserved. Skull and upper cervical spine: Normal marrow signal is preserved. Sinuses/Orbits: Mild mucosal thickening.  Orbits are unremarkable. Other: Sella is unremarkable.  Mastoid air cells are clear. IMPRESSION: Partial study without contrast.  No evidence of metastatic disease. Electronically Signed   By: Macy Mis M.D.   On: 09/17/2019 18:35   MR Brain W Wo Contrast  Result Date: 09/03/2019 CLINICAL DATA:  44 year old female with metastatic colon cancer. Seizure. EXAM: MRI HEAD WITHOUT AND WITH CONTRAST TECHNIQUE: Multiplanar, multiecho pulse sequences of the brain and surrounding structures were obtained without and with intravenous contrast. CONTRAST:  59m GADAVIST GADOBUTROL 1 MMOL/ML IV SOLN COMPARISON:  Head CT 04/10/2019.  Brain MRI 09/10/2008. FINDINGS: Brain: No abnormal enhancement identified. No midline shift, mass effect, or evidence of intracranial mass lesion. No dural thickening. No restricted diffusion to suggest acute infarction. No ventriculomegaly, extra-axial collection or acute intracranial  hemorrhage. Cervicomedullary junction and pituitary are within normal limits. GPearline Cablesand white matter signal appears stable and normal throughout the brain. No encephalomalacia or chronic cerebral blood products identified. On routine coronal T2 imaging the hippocampal formations appear symmetric and within normal limits. No migrational abnormality identified. Vascular: Major intracranial vascular flow voids are stable since 2010. Dominant left vertebral artery. The major dural venous sinuses are enhancing and appear to be patent. Skull and upper cervical spine: Negative visible cervical spine. Mildly diminished T1 marrow signal diffusely. No destructive osseous lesion identified. Sinuses/Orbits: Negative orbits. Paranasal Visualized paranasal sinuses and mastoids are stable and well pneumatized. Other: Visible internal auditory structures appear normal. Scalp and face soft tissues appear negative. IMPRESSION: 1. Stable since 2010 and normal MRI appearance of the brain. No cerebral metastatic disease identified. 2. Diffusely decreased bone marrow signal. Favor benign etiology such as red marrow reactivation in the setting of cancer treatment. But osseous metastatic disease is difficult to exclude. Electronically Signed   By: HGenevie AnnM.D.   On: 09/03/2019 15:56   EEG adult  Result Date: 09/18/2019 YLora Havens MD     09/18/2019  1:17 PM Patient Name: RHUBERT DERSTINEMRN: [id number] Attending: PLora HavensReferring Physician/Provider: Dr DShanon BrowTat Date: 09/18/2019 Duration: 26.17 mins Patient history: 89yoF with h/o epilepsy presented with breakthrough seizures. EEG to evaluate for seizure. Level of alertness: Awake, asleep AEDs during EEG study: LEV Technical  aspects: This EEG study was done with scalp electrodes positioned according to the 10-20 International system of electrode placement. Electrical activity was acquired at a sampling rate of 500Hz  and reviewed with a high frequency filter of  70Hz  and a low frequency filter of 1Hz . EEG data were recorded continuously and digitally stored. Description: The posterior dominant rhythm consists of 8 Hz activity of moderate voltage (25-35 uV) seen predominantly in posterior head regions, symmetric and reactive to eye opening and eye closing. Sleep was characterized by vertex waves, sleep spindles (12 to 14 Hz), maximal frontocentral region.  Abundant generalized spike and wave as well as polyspike and waves were seen lasting 15-20 seconds without clinical signs ( Patient was able to repeat "blue dog" during the discharges). There was also lead in left frontotemporal region noted twice before run of spikes/polyspikes.  Hyperventilation and photic stimulation were not performed.   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 Patient was able to repeat during long run of spikes/polyspikes. Therefore, if patient is at baseline, then this is most likely patient's baseline EEG. However, if patient is not at baseline, consider long term monitoring/ adjustment of AEDs. Dr Merlene Laughter was notified. Lora Havens     Microbiology: Recent Results (from the past 240 hour(s))  SARS Coronavirus 2 by RT PCR (hospital order, performed in [facility] hospital lab) Nasopharyngeal Throat     Status: Abnormal   Collection Time: 09/10/19 10:32 AM   Specimen: Throat; Nasopharyngeal  Result Value Ref Range Status   SARS Coronavirus 2 POSITIVE (A) NEGATIVE Final    Comment: RESULT CALLED TO, READ BACK BY AND VERIFIED WITH: J.STRENK AT 8295 ON 09/10/19 BY N.THOMPSON (NOTE) SARS-CoV-2 target nucleic acids are DETECTED  SARS-CoV-2 RNA is generally detectable in upper respiratory specimens  during the acute phase of infection.  Positive results are indicative  of the presence of the identified virus, but do not rule out bacterial infection or  co-infection with other pathogens not detected by the test.  Clinical correlation with patient history and  other diagnostic information is necessary to determine patient infection status.  The expected result is negative.  Fact Sheet for Patients:   StrictlyIdeas.no   Fact Sheet for Healthcare Providers:   BankingDealers.co.za    This test is not yet approved or cleared by the [place] FDA and  has been authorized for detection and/or diagnosis of SARS-CoV-2 by FDA under an Emergency Use Authorization (EUA).  This EUA will remain in effect (meaning t his test can be used) for the duration of  the COVID-19 declaration under Section 564(b)(1) of the Act, 21 U.S.C. section 360-bbb-3(b)(1), unless the authorization is terminated or revoked sooner.  Performed at [facility], [place] [address] [place], [place] [address]   Group A Strep by PCR     Status: None   Collection Time: 09/10/19 11:14 AM   Specimen: Throat; Sterile Swab  Result Value Ref Range Status   Group A Strep by PCR NOT DETECTED NOT DETECTED Final    Comment: Performed at [facility], [place] [address] [place], [place] [address]     Labs: Basic Metabolic Panel: Recent Labs  Lab 09/17/19 1106 09/17/19 1106 09/18/19 0136 09/18/19 0952 09/19/19 0700  NA 148*  --   --  142 143  K 3.5   < >  --  3.3* 3.5  CL 107  --   --  110 109  CO2 20*  --   --  23 25  GLUCOSE 183*  --   --  101* 111*  BUN 13  --   --  12 8  CREATININE 0.72  --   --  0.42* 0.45  CALCIUM 9.1  --   --  8.6* 8.7*  MG 1.4*  --  1.7  --  1.2*   < > = values in this interval not displayed.   Liver Function Tests: Recent Labs  Lab 09/17/19 1106 09/18/19 0952  AST 63* 36  ALT 49* 36  ALKPHOS 107 83  BILITOT 0.6 0.7  PROT 7.8 6.3*  ALBUMIN 4.2 3.5   No results for input(s): LIPASE, AMYLASE in the last 168 hours. No results for input(s): AMMONIA in the  last 168 hours. CBC: Recent Labs  Lab 09/17/19 1106 09/18/19 0136 09/19/19 0700  WBC 12.1* 2.6* 3.5*  NEUTROABS 10.2*  --  2.4  HGB 13.2 10.3* 10.3*  HCT 46.2* 34.6* 34.4*  MCV 87.2 84.4 82.9  PLT 222 144* 153   Cardiac Enzymes: No results for input(s): CKTOTAL, CKMB, CKMBINDEX, TROPONINI in the last 168 hours. BNP: Invalid input(s): POCBNP CBG: Recent Labs  Lab 09/17/19 1255  GLUCAP 140*    Time coordinating discharge:  36 minutes  Signed:  Orson Eva, DO [facility] Pager: [phone] 09/19/2019, 11:52 AM

## 2019-09-24 ENCOUNTER — Other Ambulatory Visit: Payer: BC Managed Care – PPO

## 2019-09-24 ENCOUNTER — Ambulatory Visit: Payer: BC Managed Care – PPO | Admitting: Nurse Practitioner

## 2019-09-24 ENCOUNTER — Ambulatory Visit: Payer: BC Managed Care – PPO

## 2019-09-25 ENCOUNTER — Encounter (HOSPITAL_COMMUNITY): Payer: Self-pay | Admitting: Physical Therapy

## 2019-09-25 ENCOUNTER — Ambulatory Visit (HOSPITAL_COMMUNITY): Payer: BC Managed Care – PPO | Attending: Oncology | Admitting: Physical Therapy

## 2019-09-25 ENCOUNTER — Other Ambulatory Visit: Payer: Self-pay

## 2019-09-25 ENCOUNTER — Telehealth (HOSPITAL_COMMUNITY): Payer: Self-pay | Admitting: Physical Therapy

## 2019-09-25 DIAGNOSIS — M6281 Muscle weakness (generalized): Secondary | ICD-10-CM | POA: Diagnosis not present

## 2019-09-25 DIAGNOSIS — R2689 Other abnormalities of gait and mobility: Secondary | ICD-10-CM

## 2019-09-25 NOTE — Telephone Encounter (Signed)
Patient reports she has not had a fever since 8/5 and once they did the infusion she was immediately better. She has her taste and smell back and doesn't have a consent cough. She plans on coming unless PT tell me to call her back to cx.

## 2019-09-25 NOTE — Therapy (Signed)
[place], [place], [address] Phone: [phone]   Fax:  [phone]  Physical Therapy Evaluation  Patient Details  Name: [name] MRN: [id number] Date of Birth: 1975/04/15 Referring Provider (PT): [name], MD   Encounter Date: 09/25/2019   PT End of Session - 09/25/19 1640    Visit Number 1    Number of Visits 12    Date for PT Re-Evaluation 11/06/19    Authorization Type [employer] (30 visit limit)    Authorization - Visit Number 1    Authorization - Number of Visits 30    Progress Note Due on Visit 10    PT Start Time 1552    PT Stop Time 1630    PT Time Calculation (min) 38 min    Activity Tolerance Patient tolerated treatment well    Behavior During Therapy [facility] for tasks assessed/performed           Past Medical History:  Diagnosis Date  . ADD (attention deficit disorder)   . Anemia, iron deficiency   . Anxiety   . Colon cancer ([place]) 08/2013  . Depression   . Difficulty swallowing pills   . GERD (gastroesophageal reflux disease)   . Headache    migraines "long time ago"  . History of blood transfusion 08/16/2013  . History of migraine    no problems in "a long time"  . 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 Restless leg syndrome   . Seizures ([place])    last seizure 08/2012    Past Surgical History:  Procedure Laterality Date  . ABDOMINAL HYSTERECTOMY  08/24/2004   partial  . APPENDECTOMY  08/24/2004  . COLON SURGERY  08/11/2013   removed a foot of colon  . COLONOSCOPY  2019  . colonoscopy 10-18-14    . COLONOSCOPY WITH PROPOFOL  07/07/2013  . CYSTOSCOPY  11/01/2003  . ENDOMETRIAL FULGURATION  11/01/2003  . ESOPHAGOGASTRODUODENOSCOPY  07/07/2013  . IR IMAGING GUIDED PORT INSERTION  03/05/2019  . LAPAROSCOPIC LYSIS OF ADHESIONS  11/01/2003  . LAPAROSCOPIC PARTIAL COLECTOMY Right 08/11/2013   Procedure: LAPAROSCOPIC RIGHT HEMICOLECTOMY;  Surgeon: Stark Klein, MD;   Location: [facility];  Service: General;  Laterality: Right;  . LAPAROTOMY N/A 12/28/2014   Procedure: EXPLORATORY LAPAROTOMY;  Surgeon: Everitt Amber, MD;  Location: [facility];  Service: Gynecology;  Laterality: N/A;  . LEFT OOPHORECTOMY  08/24/2004  . PANNICULECTOMY  08/24/2004  . PORT-A-CATH REMOVAL Left 06/08/2014   Procedure: REMOVAL PORT-A-CATH;  Surgeon: Stark Klein, MD;  Location: [place] [facility];  Service: General;  Laterality: Left;  . PORTACATH PLACEMENT Left 09/09/2013   Procedure: INSERTION PORT-A-CATH;  Surgeon: Stark Klein, MD;  Location: [place];  Service: General;  Laterality: Left;  . SALPINGOOPHORECTOMY Right 12/28/2014   Procedure: RIGHT SALPINGO OOPHORECTOMY;  Surgeon: Everitt Amber, MD;  Location: [facility];  Service: Gynecology;  Laterality: Right;  . TUBAL LIGATION  11/07/2000  . UNILATERAL SALPINGECTOMY Left 08/24/2004  . WISDOM TOOTH EXTRACTION Bilateral 1994    There were no vitals filed for this visit.    Subjective Assessment - 09/25/19 1559    Subjective Patient reported that she has colon cancer and that she had surgery last year to take out part of her colon and that she had a colostomy bag and then they did a surgery to reverse it. She reported that her left leg has been weak since she had the second surgery and that she  drags her left leg at times. She stated that she did have 1 fall. She reported that she had 1 seizure recently. She stated that she is feeling a lot better since she was in the hospital on 09/10/19 for an infusion and she had a fever, she stated that she tested positive to Covid-19, but that was the last day of her symptoms. She stated she then had a seizure on August 12 and was in the hospital for this. She reported that she will be getting chemo again in September.    Pertinent History Colon Cancer, history of seizures, Covid-19 diagnosis on 09/10/19    Limitations Standing;Walking;House hold activities    Patient Stated Goals To get her left LE stronger     Currently in Pain? No/denies              [facility] PT Assessment - 09/25/19 0001      Assessment   Medical Diagnosis Covid-19 virus infection; Cancer of Ascending Colon    Referring Provider (PT) [name], MD    Onset Date/Surgical Date --   November 2020   Next MD Visit Unknown    Prior Therapy None      Precautions   Precautions Fall;Other (comment)   Active cancer     Restrictions   Weight Bearing Restrictions No      Balance Screen   Has the patient fallen in the past 6 months Yes    How many times? 1    Has the patient had a decrease in activity level because of a fear of falling?  No    Is the patient reluctant to leave their home because of a fear of falling?  No      Home Environment   Living Environment Private residence    Living Arrangements Spouse/significant other    Type of [place] to enter    Entrance Stairs-Number of Steps 3    Entrance Stairs-Rails None    Home Layout One level      Prior Function   Level of Independence Independent    Vocation Full time employment    Freight forwarder      Cognition   Overall Cognitive Status Within Functional Limits for tasks assessed      ROM / Strength   AROM / PROM / Strength Strength      Strength   Strength Assessment Site Hip;Knee;Ankle    Right/Left Hip Right;Left    Right Hip Flexion 4/5    Right Hip Extension 4-/5    Right Hip ABduction 4+/5    Left Hip Flexion 4-/5    Left Hip Extension 4-/5    Left Hip ABduction 4+/5    Right/Left Knee Right;Left    Right Knee Flexion 4+/5    Right Knee Extension 4+/5    Left Knee Flexion 4/5    Left Knee Extension 4+/5    Right/Left Ankle Right;Left    Right Ankle Dorsiflexion 4+/5    Left Ankle Dorsiflexion 3/5      Transfers   Five time sit to stand comments  10.88 seconds      Ambulation/Gait   Ambulation/Gait Yes    Ambulation Distance (Feet) 346 Feet   2MWT   Assistive device None    Gait Pattern  Left steppage;Decreased dorsiflexion - left    Ambulation Surface Level;Indoor    Gait Comments Noted increased knee flexion and hip flexion LT to clear foot  Balance   Balance Assessed Yes      Static Standing Balance   Static Standing - Balance Support No upper extremity supported    Static Standing - Comment/# of Minutes LT 2 seconds; RT 16 seconds                      Objective measurements completed on examination: See above findings.               PT Education - 09/25/19 1640    Education Details Discussed examination findings, POC, and initial HEP.    Person(s) Educated Patient    Methods Explanation;Handout    Comprehension Verbalized understanding            PT Short Term Goals - 09/25/19 1642      PT SHORT TERM GOAL #1   Title Patient will report understanding and regular compliance with HEP to improve strength, balance, and overall functional mobility.    Time 3    Period Weeks    Status New    Target Date 10/16/19      PT SHORT TERM GOAL #2   Title Patient will report improvement in overall subjective complaint of at least 25% for improved QOL.    Time 3    Period Weeks    Status New    Target Date 10/16/19             PT Long Term Goals - 09/25/19 1643      PT LONG TERM GOAL #1   Title Patient will report improvement in overall subjective complaint of at least 50% for improved QOL.    Time 6    Period Weeks    Status New    Target Date 11/06/19      PT LONG TERM GOAL #2   Title Patient will demonstrate improvement of at least 1/2 MMT strength grade indicating improved strength for improved functional mobility.    Time 6    Period Weeks    Status New    Target Date 11/06/19      PT LONG TERM GOAL #3   Title Patient will demonstrate ability to perform SLS on the LT lower extremity for at least 5 seconds for improved ease of negotiating stairs.    Time 6    Period Weeks    Status New    Target Date 11/06/19                    Plan - 09/25/19 1657    Clinical Impression Statement Patient is a 44 year old female who presents to outpatient physical therapy with primary complaint of left leg weakness which has been ongoing since she had a surgery related to her colon cancer treatment. Upon examination, patient presents with decreased LE strength particularly of the left lower extremity. In addition, patient demonstrated decreased balance and gait deviations with ambulation as she demonstrates poor foot clearance and increased compensation with hip and knee flexion on the left lower extremity. Patient is currently limited in functional mobility and has also had a fall since these deficits have developed. Patient would benefit from skilled physical therapy in order to address the abovementioned deficits and help patient return to her PLOF.    Personal Factors and Comorbidities Comorbidity 3+    Comorbidities Colon cancer, history of seizures, post covid-19, chemotherapy    Examination-Activity Limitations Stand;Locomotion Level;Squat;Stairs    Examination-Participation Restrictions Cleaning;Meal Prep;Yard Work;Occupation;Community Activity;Laundry    Stability/Clinical  Decision Making Evolving/Moderate complexity    Clinical Decision Making Moderate    Rehab Potential Good    PT Frequency 2x / week    PT Duration 6 weeks    PT Treatment/Interventions ADLs/Self Care Home Management;Aquatic Therapy;Cryotherapy;Electrical Stimulation;Moist Heat;DME Instruction;Gait training;Stair training;Functional mobility training;Therapeutic activities;Therapeutic exercise;Balance training;Neuromuscular re-education;Patient/family education;Orthotic Fit/Training;Manual techniques;Scar mobilization;Passive range of motion;Dry needling;Taping    PT Next Visit Plan Review HEP. Focus on lower extremity strengthening particularly of the LT LE and particularly for DF and hip flexion. Work on balance activities static and dynamic  balance.    PT Home Exercise Plan 09/25/19: Marching, SLR, knee flexion    Consulted and Agree with Plan of Care Patient           Patient will benefit from skilled therapeutic intervention in order to improve the following deficits and impairments:  Abnormal gait, Decreased activity tolerance, Decreased strength, Decreased balance, Difficulty walking  Visit Diagnosis: Muscle weakness (generalized)  Other abnormalities of gait and mobility     Problem List Patient Active Problem List   Diagnosis Date Noted  . Liver metastases ([place])   . Metastatic colon cancer to liver ([place])   . Palliative care by specialist   . COVID-19 virus infection 09/17/2019  . Port-A-Cath in place 06/04/2019  . Seizure ([place]) 04/10/2019  . Goals of care, counseling/discussion 02/23/2019  . 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 Ileus ([place])   . Metastatic malignant neoplasm ([place])   . Genetic testing 11/13/2015  . Sinusitis 01/29/2015  . Pelvic mass in female 12/28/2014  . Ovarian mass, right   . Hypokalemia 02/03/2014  . Nausea 12/20/2013  . Fever 12/18/2013  . Dehydration 12/18/2013  . Diarrhea 10/28/2013  . Malfunction of device 10/28/2013  . Weight loss 10/28/2013  . Mucositis (ulcerative) due to antineoplastic therapy 10/28/2013  . Anemia due to antineoplastic chemotherapy 09/25/2013  . Chemotherapy induced thrombocytopenia 09/25/2013  . Hypomagnesemia 09/25/2013  . Hypophosphatemia 09/25/2013  . Adverse drug effect 09/25/2013  . Abdominal pain 09/24/2013  . Cancer of ascending colon s/p right colectomy 08/11/2013 08/05/2013  . Anemia, iron deficiency 01/28/2013  . 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[place]) 11/11/2012  . Discoloration of skin of face 09/03/2012  . Medication reaction 05/03/2012  . Migraine 03/31/2012  . Hyperglycemia 01/18/2012  . Fatigue 12/12/2011  . ADD (attention deficit disorder) 09/23/2010  . Right wrist pain 05/01/2010  . LIBIDO, DECREASED 04/07/2010  . IRON  DEFICIENCY 05/17/2009  . ANA POSITIVE 05/17/2009  . OVARIAN CYST 05/10/2009  . FATTY LIVER DISEASE 03/24/2009  . TRANSAMINASES, SERUM, ELEVATED 03/21/2009  . Gastroparesis 08/16/2008  . DEPRESSION/ANXIETY 09/19/2006  . HYPERLIPIDEMIA 08/16/2006  . RESTLESS LEG SYNDROME 08/16/2006  . GERD 08/16/2006  . IBS 08/16/2006   Clarene Critchley PT, DPT 5:04 PM, 09/25/19 [place], [place], [address] Phone: [phone]   Fax:  [phone]  Name: [name] MRN: [id number] Date of Birth: 08-10-1975

## 2019-09-25 NOTE — Patient Instructions (Addendum)
Marching in Place:    March in place, slowly lifting knees toward ceiling. Repeat _20___ times per session. Do __1__ sessions per day. Perform on solid ground do not use pillow.   Copyright  VHI. All rights reserved.   Hip Flexion / Knee Extension: Straight-Leg Raise (Eccentric)    Lie on back. Lift leg with knee straight. Slowly lower leg for 3 seconds. __10_ reps per set, _1-2__ sets per day, _7__ days per week.   Copyright  VHI. All rights reserved.  FLEXION: Standing - Stable (Power)    Stand, both feet flat. Bend right knee, bringing heel toward buttocks as quickly as possible.  Complete __1_ sets of _10__ repetitions. Perform _1__ sessions per day.  Copyright  VHI. All rights reserved.

## 2019-09-26 ENCOUNTER — Ambulatory Visit: Payer: BC Managed Care – PPO

## 2019-09-29 ENCOUNTER — Ambulatory Visit (HOSPITAL_COMMUNITY): Payer: BC Managed Care – PPO | Admitting: Physical Therapy

## 2019-09-29 ENCOUNTER — Inpatient Hospital Stay: Payer: BC Managed Care – PPO

## 2019-10-07 ENCOUNTER — Other Ambulatory Visit: Payer: Self-pay | Admitting: *Deleted

## 2019-10-07 DIAGNOSIS — C182 Malignant neoplasm of ascending colon: Secondary | ICD-10-CM

## 2019-10-08 ENCOUNTER — Inpatient Hospital Stay: Payer: BC Managed Care – PPO

## 2019-10-08 ENCOUNTER — Inpatient Hospital Stay (HOSPITAL_BASED_OUTPATIENT_CLINIC_OR_DEPARTMENT_OTHER): Payer: BC Managed Care – PPO | Admitting: Nurse Practitioner

## 2019-10-08 ENCOUNTER — Other Ambulatory Visit: Payer: Self-pay

## 2019-10-08 ENCOUNTER — Inpatient Hospital Stay: Payer: BC Managed Care – PPO | Attending: Oncology

## 2019-10-08 ENCOUNTER — Encounter: Payer: Self-pay | Admitting: Nurse Practitioner

## 2019-10-08 VITALS — BP 120/73 | HR 72 | Temp 98.3°F | Resp 16 | Ht 66.0 in | Wt 149.1 lb

## 2019-10-08 DIAGNOSIS — R4586 Emotional lability: Secondary | ICD-10-CM | POA: Insufficient documentation

## 2019-10-08 DIAGNOSIS — G479 Sleep disorder, unspecified: Secondary | ICD-10-CM | POA: Insufficient documentation

## 2019-10-08 DIAGNOSIS — Z452 Encounter for adjustment and management of vascular access device: Secondary | ICD-10-CM | POA: Diagnosis not present

## 2019-10-08 DIAGNOSIS — Z95828 Presence of other vascular implants and grafts: Secondary | ICD-10-CM

## 2019-10-08 DIAGNOSIS — C182 Malignant neoplasm of ascending colon: Secondary | ICD-10-CM | POA: Insufficient documentation

## 2019-10-08 DIAGNOSIS — Z5112 Encounter for antineoplastic immunotherapy: Secondary | ICD-10-CM | POA: Diagnosis not present

## 2019-10-08 DIAGNOSIS — C787 Secondary malignant neoplasm of liver and intrahepatic bile duct: Secondary | ICD-10-CM | POA: Diagnosis not present

## 2019-10-08 DIAGNOSIS — D509 Iron deficiency anemia, unspecified: Secondary | ICD-10-CM | POA: Diagnosis not present

## 2019-10-08 DIAGNOSIS — Z5111 Encounter for antineoplastic chemotherapy: Secondary | ICD-10-CM | POA: Diagnosis not present

## 2019-10-08 LAB — CBC WITH DIFFERENTIAL (CANCER CENTER ONLY)
Abs Immature Granulocytes: 0.01 10*3/uL (ref 0.00–0.07)
Basophils Absolute: 0 10*3/uL (ref 0.0–0.1)
Basophils Relative: 0 %
Eosinophils Absolute: 0.1 10*3/uL (ref 0.0–0.5)
Eosinophils Relative: 4 %
HCT: 33 % — ABNORMAL LOW (ref 36.0–46.0)
Hemoglobin: 10.1 g/dL — ABNORMAL LOW (ref 12.0–15.0)
Immature Granulocytes: 0 %
Lymphocytes Relative: 24 %
Lymphs Abs: 0.7 10*3/uL (ref 0.7–4.0)
MCH: 24.8 pg — ABNORMAL LOW (ref 26.0–34.0)
MCHC: 30.6 g/dL (ref 30.0–36.0)
MCV: 81.1 fL (ref 80.0–100.0)
Monocytes Absolute: 0.2 10*3/uL (ref 0.1–1.0)
Monocytes Relative: 7 %
Neutro Abs: 1.7 10*3/uL (ref 1.7–7.7)
Neutrophils Relative %: 65 %
Platelet Count: 112 10*3/uL — ABNORMAL LOW (ref 150–400)
RBC: 4.07 MIL/uL (ref 3.87–5.11)
RDW: 15.3 % (ref 11.5–15.5)
WBC Count: 2.7 10*3/uL — ABNORMAL LOW (ref 4.0–10.5)
nRBC: 0 % (ref 0.0–0.2)

## 2019-10-08 LAB — CMP (CANCER CENTER ONLY)
ALT: 26 U/L (ref 0–44)
AST: 26 U/L (ref 15–41)
Albumin: 3.4 g/dL — ABNORMAL LOW (ref 3.5–5.0)
Alkaline Phosphatase: 118 U/L (ref 38–126)
Anion gap: 8 (ref 5–15)
BUN: 7 mg/dL (ref 6–20)
CO2: 26 mmol/L (ref 22–32)
Calcium: 9.1 mg/dL (ref 8.9–10.3)
Chloride: 108 mmol/L (ref 98–111)
Creatinine: 0.76 mg/dL (ref 0.44–1.00)
GFR, Est AFR Am: >60 mL/min (ref >60)
GFR, Estimated: >60 mL/min (ref >60)
Glucose, Bld: 190 mg/dL — ABNORMAL HIGH (ref 70–99)
Potassium: 4 mmol/L (ref 3.5–5.1)
Sodium: 142 mmol/L (ref 135–145)
Total Bilirubin: 0.6 mg/dL (ref 0.3–1.2)
Total Protein: 6.5 g/dL (ref 6.5–8.1)

## 2019-10-08 LAB — MAGNESIUM: Magnesium: 1.6 mg/dL — ABNORMAL LOW (ref 1.7–2.4)

## 2019-10-08 MED ORDER — SODIUM CHLORIDE 0.9% FLUSH
10.0000 mL | INTRAVENOUS | Status: DC | PRN
  Administered 2019-10-08: 10 mL
  Filled 2019-10-08: qty 10

## 2019-10-08 MED ORDER — ATROPINE SULFATE 1 MG/ML IJ SOLN
INTRAMUSCULAR | Status: AC
  Filled 2019-10-08: qty 1

## 2019-10-08 MED ORDER — SODIUM CHLORIDE 0.9 % IV SOLN
6.0000 mg/kg | Freq: Once | INTRAVENOUS | Status: AC
  Administered 2019-10-08: 400 mg via INTRAVENOUS
  Filled 2019-10-08: qty 20

## 2019-10-08 MED ORDER — SODIUM CHLORIDE 0.9 % IV SOLN
140.0000 mg/m2 | Freq: Once | INTRAVENOUS | Status: AC
  Administered 2019-10-08: 260 mg via INTRAVENOUS
  Filled 2019-10-08: qty 13

## 2019-10-08 MED ORDER — PALONOSETRON HCL INJECTION 0.25 MG/5ML
0.2500 mg | Freq: Once | INTRAVENOUS | Status: AC
  Administered 2019-10-08: 0.25 mg via INTRAVENOUS

## 2019-10-08 MED ORDER — ATROPINE SULFATE 1 MG/ML IJ SOLN
0.5000 mg | Freq: Once | INTRAMUSCULAR | Status: AC | PRN
  Administered 2019-10-08: 0.5 mg via INTRAVENOUS

## 2019-10-08 MED ORDER — DIPHENOXYLATE-ATROPINE 2.5-0.025 MG PO TABS: ORAL_TABLET | ORAL | 1 refills | Status: DC

## 2019-10-08 MED ORDER — SODIUM CHLORIDE 0.9% FLUSH
10.0000 mL | Freq: Once | INTRAVENOUS | Status: AC
  Administered 2019-10-08: 10 mL
  Filled 2019-10-08: qty 10

## 2019-10-08 MED ORDER — SODIUM CHLORIDE 0.9 % IV SOLN
Freq: Once | INTRAVENOUS | Status: AC
  Filled 2019-10-08: qty 250

## 2019-10-08 MED ORDER — PALONOSETRON HCL INJECTION 0.25 MG/5ML
INTRAVENOUS | Status: AC
  Filled 2019-10-08: qty 5

## 2019-10-08 MED ORDER — HEPARIN SOD (PORK) LOCK FLUSH 100 UNIT/ML IV SOLN
500.0000 [IU] | Freq: Once | INTRAVENOUS | Status: AC | PRN
  Administered 2019-10-08: 500 [IU]
  Filled 2019-10-08: qty 5

## 2019-10-08 MED ORDER — SODIUM CHLORIDE 0.9 % IV SOLN
10.0000 mg | Freq: Once | INTRAVENOUS | Status: AC
  Administered 2019-10-08: 10 mg via INTRAVENOUS
  Filled 2019-10-08: qty 10
  Filled 2019-10-08: qty 1

## 2019-10-08 NOTE — Progress Notes (Signed)
Okay to treat with Mag 1.6 per Ned Card NP

## 2019-10-08 NOTE — Progress Notes (Addendum)
[facility] OFFICE PROGRESS NOTE   Diagnosis: Colon cancer  INTERVAL HISTORY:   [name] returns for follow-up.  She was treated with Irinotecan/Panitumumab 08/27/2019.  She was subsequently diagnosed with Covid and then hospitalized with seizure activity.  She reports feeling well today.  The abdominal wound has "closed".  She denies nausea/vomiting.  No mouth sores.  She continues to have diarrhea intermittently.  She takes Lomotil as needed.  No rash.  Objective:  Vital signs in last 24 hours:  Blood pressure 120/73, pulse 72, temperature 98.3 F (36.8 C), temperature source Tympanic, resp. rate 16, height _0  (1.676 m), weight 149 lb 1.6 oz (67.6 kg), SpO2 100 %.    HEENT: No thrush or ulcers. Resp: Lungs clear bilaterally. Cardio: Regular rate and rhythm. GI: Abdomen soft and nontender.  No hepatomegaly.  Fistula site appears healed. Vascular: No leg edema. Neuro: Alert and oriented. Skin: No rash. Port-A-Cath without erythema.   Lab Results:  Lab Results  Component Value Date   WBC 2.7 (L) 10/08/2019   HGB 10.1 (L) 10/08/2019   HCT 33.0 (L) 10/08/2019   MCV 81.1 10/08/2019   PLT 112 (L) 10/08/2019   NEUTROABS 1.7 10/08/2019    Imaging:  No results found.  Medications: I have reviewed the patient's current medications.  Assessment/Plan: 1. 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  The tumor returned microsatellite stable with no loss of mismatch repair protein expression   APC mutated. No BRAF, KRAS, or NRAS mutation On Foundation 1 testing   Cycle 1 adjuvant FOLFOX 09/08/2013   Cycle 2 adjuvant FOLFOX 09/24/2013   Cycle 3 adjuvant FOLFOX 10/08/2013.   Cycle 4 adjuvant FOLFOX 10/22/2013.   Cycle 5 adjuvant FOLFOX 11/05/2013. Oxaliplatin held due to thrombocytopenia.  Cycle 6 FOLFOX 11/19/2013.  Cycle 7 FOLFOX 12/03/2013.  Oxaliplatin held secondary to thrombocytopenia.  Cycle 8 FOLFOX 12/17/2013.  Cycle 9 FOLFOX 01/04/2014. Oxaliplatin held secondary to neutropenia.   Cycle 10 FOLFOX 01/21/2014. Oxaliplatin held secondary to thrombocytopenia.  Cycle 11 FOLFOX 02/04/2014  Cycle 12 FOLFOX 02/18/2014, oxaliplatin dose reduced secondary tothrombocytopenia  CT abdomen/pelvis 01/30/2014 revealed splenomegaly and no evidence of recurrent colon cancer  CT chest 04/07/2014 with a stable right lower lobe nodule and no evidence for metastatic disease, no nodules seen on the CT 11/26/2014  Markedly elevated CEA 11/24/2014  CT 11/26/2014 revealed a right pelvic mass, splenomegaly, small volume ascites  Right salpingo-oophorectomy 12/28/2014 with the pathology confirming metastatic colon cancer  CTs 03/23/2016-no evidence of recurrent or metastatic disease.  CT 11/26/2016-enlargement of a fluid density structure the right pelvic sidewall, no other evidence of metastatic disease  CT aspiration right pelvic cyst 12/19/2016.Cytology-BENIGN REACTIVE/REPARATIVE CHANGES.  CTs 06/05/2017-no evidence of metastatic disease, mild cirrhotic changes with splenomegaly  CTs 12/11/2017-recurrent cystic right adnexal mass, similar to on the CT 11/26/2016, stable mild splenomegaly  CTs 04/23/2018-enlargement of cystic right adnexal mass with mural nodularity, no other evidence of metastatic disease  Cytoreductive [facility]HIPEC [facility]-R1 resection achieved. Cytoreduction included omentectomy, LAR, right salpingo-oophorectomy and left colonic gutter/pelvic stripping. Pathology on the rectum showed recurrent/metastatic adenocarcinoma, tumor 2.0 cm, predominantly involving the subserosa and muscularis propria of the colon, proximal and distal margins of resection were negative, vascular invasion present, metastatic carcinoma present in 1 out of 5 lymph nodes; omentum resection with no  malignancy seen, no metastatic carcinoma identified in 1 lymph node examined; left gutter  stripping positive metastatic adenocarcinoma; right ovary resection positive metastatic adenocarcinoma.  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  CEA 68 02/03/2019  Biopsy liver lesion 02/11/2019-metastatic adenocarcinoma consistent with primary colonic adenocarcinoma  CTs 02/27/2019-multiple liver lesions increased in size, new lesion in the lateral right lobe of the liver. No evidence of metastatic disease in the chest. Redemonstrated moderate left hydronephrosis and proximal hydroureter without discrete lesion or obstructing etiology at the transition point of the mid ureter.  Cycle 1 FOLFIRI/Panitumumab 03/12/2019  Cycle 2 FOLFIRI/Panitumumab 03/26/2019-bolus 5-FU and irinotecan held secondary to neutropenia  Cycle 3 FOLFIRI/Panitumumab 04/09/2019, Udenyca added-not given secondary to seizure/discontinuation of the 5-FU pump  CT [facility]pelvis [facility] 05/04/2019-no residual fluid collection at the left abdominal wall abscess, stable moderate left hydronephrosis, multifocal indeterminate liver lesions--liver lesionssignificantly improved  Cycle 4 FOLFIRI/Panitumumab 05/07/2019, Udenyca  Cycle 5 FOLFIRI/Panitumumab 05/20/2019, Udenyca  Cycle 6 FOLFIRI/Panitumumab 06/18/2019, Udenyca  Cycle 7 FOLFIRI/Panitumumab 07/01/2019 (Irinotecan dose reduced due to thrombocytopenia), Udenyca--Udenyca was not given  Cycle 8 FOLFIRI/Panitumumab 07/15/2019, Udenyca  Cycle 9 FOLFIRI/Panitumumab 07/29/2019, Udenyca  Cycle 10 FOLFIRI/Panitumumab 08/13/2019, Udenyca  CTs at [facility] 08/19/2019-decreased size of the hypodense and hyperdense lesions within segment 2 of the left hepatic lobe. No new lesions identified. Similar presacral soft tissue thickening with adjacent stable alignment in the rectum. Improved but persistent left UPJ  obstruction with mild left hydronephrosis. Persistent but decreased size of the wound within the left mid abdomen abdominal wall.  Cycle 11 irinotecan/Panitumumab 08/27/2019, Udenyca  Cycle 12 irinotecan/Panitumumab 10/08/2019, Udenyca 2. Mild elevation of the CEA beginning January 2016, normal on 05/19/2014   3. History of iron deficiency anemia  4. seizure disorder; seizure 04/09/2019. Brain CT negative. Now on Keppra. 5. history of depression  6. 4 mm right lower lobe nodule on a staging chest CT 09/08/2013 , stable on a CT 04/07/2014 7. Hospitalization 09/24/2013 through 09/26/2013 with fever and abdominal pain.  8. 09/24/2013 urine culture positive for coag negative staph.  9. 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 Splenomegaly noted on a CT scan 01/30/2014,persistent on repeat CTs 12. Colonoscopy 11/17/2018-flexible sigmoidoscopy per rectum with changes of mild diversion colitis. Scope advanced for approximately 25 cm. Most proximal portion had necrotic appearing debris. Colostomy bag insufflated suggesting some type of communication between the pouch and the proximal colon. Introduction of scope into the ostomy found to available directions. 1 toward the distal pouch with similar appearing necrotic debris encountered. The other was about a 30 cm segment of normal-appearing colonic mucosa to the level of the previous right hemicolectomy ileocolonic anastomosis. 66. Port-A-Cath placement interventional radiology 03/05/2019 14. Left leg/foot weakness-brain MRI 09/03/2019 with no evidence of metastatic disease, referred to physical therapy 15. 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 COVID-19 + 09/10/2019 17. Hospitalized with seizure 09/17/2019 through 09/19/2019-MRI brain without evidence of metastatic disease. Seen by neurology. Keppra dose increased.  Disposition: [name] appears stable.  She completed a cycle of irinotecan/Panitumumab 08/27/2019.  Since then she was diagnosed with Covid and about a week later hospitalized with seizure activity.  She appears well today.  Plan to resume treatment  with irinotecan/Panitumumab every 2 weeks.  We reviewed the CBC and chemistry panel from today.  Labs adequate to proceed with treatment.  Magnesium is mildly low.  She will continue oral magnesium.  She will return for lab, follow-up, irinotecan/Panitumumab in 2 weeks.  She will contact the office in the interim with any problems.  Patient seen with Dr. Benay Spice.   Ned Card ANP/GNP-BC   10/08/2019  12:20 PM  This was a shared visit with Ned Card.  [name] was interviewed and examined.  Treatment has been delayed secondary to COVID-19 infection and hospital admission with a seizure.  The abdominal wound has healed.  The plan is to resume irinotecan/Panitumumab today.  Julieanne Manson, MD

## 2019-10-08 NOTE — Patient Instructions (Signed)

## 2019-10-09 LAB — CEA (IN HOUSE-CHCC): CEA (CHCC-In House): 2.02 ng/mL (ref 0.00–5.00)

## 2019-10-10 ENCOUNTER — Inpatient Hospital Stay: Payer: BC Managed Care – PPO

## 2019-10-18 ENCOUNTER — Other Ambulatory Visit: Payer: Self-pay | Admitting: Oncology

## 2019-10-19 ENCOUNTER — Encounter: Payer: Self-pay | Admitting: Oncology

## 2019-10-21 ENCOUNTER — Other Ambulatory Visit: Payer: Self-pay | Admitting: *Deleted

## 2019-10-21 DIAGNOSIS — C182 Malignant neoplasm of ascending colon: Secondary | ICD-10-CM

## 2019-10-21 MED FILL — Dexamethasone Sodium Phosphate Inj 100 MG/10ML: INTRAMUSCULAR | Qty: 1 | Status: AC

## 2019-10-22 ENCOUNTER — Other Ambulatory Visit: Payer: Self-pay | Admitting: *Deleted

## 2019-10-22 ENCOUNTER — Inpatient Hospital Stay: Payer: BC Managed Care – PPO

## 2019-10-22 ENCOUNTER — Other Ambulatory Visit: Payer: Self-pay

## 2019-10-22 ENCOUNTER — Other Ambulatory Visit: Payer: BC Managed Care – PPO

## 2019-10-22 ENCOUNTER — Inpatient Hospital Stay (HOSPITAL_BASED_OUTPATIENT_CLINIC_OR_DEPARTMENT_OTHER): Payer: BC Managed Care – PPO | Admitting: Oncology

## 2019-10-22 ENCOUNTER — Telehealth: Payer: Self-pay | Admitting: Oncology

## 2019-10-22 ENCOUNTER — Ambulatory Visit
Admission: RE | Admit: 2019-10-22 | Discharge: 2019-10-22 | Disposition: A | Discharge status: Home / Self Care | Payer: Self-pay | Source: Ambulatory Visit | Attending: Oncology | Admitting: Oncology

## 2019-10-22 VITALS — BP 107/73 | HR 94 | Temp 98.1°F | Resp 16 | Wt 142.6 lb

## 2019-10-22 DIAGNOSIS — C182 Malignant neoplasm of ascending colon: Secondary | ICD-10-CM

## 2019-10-22 DIAGNOSIS — Z5112 Encounter for antineoplastic immunotherapy: Secondary | ICD-10-CM | POA: Diagnosis not present

## 2019-10-22 DIAGNOSIS — D509 Iron deficiency anemia, unspecified: Secondary | ICD-10-CM | POA: Diagnosis not present

## 2019-10-22 DIAGNOSIS — G479 Sleep disorder, unspecified: Secondary | ICD-10-CM | POA: Diagnosis not present

## 2019-10-22 DIAGNOSIS — Z5111 Encounter for antineoplastic chemotherapy: Secondary | ICD-10-CM | POA: Diagnosis not present

## 2019-10-22 DIAGNOSIS — Z452 Encounter for adjustment and management of vascular access device: Secondary | ICD-10-CM | POA: Diagnosis not present

## 2019-10-22 DIAGNOSIS — R4586 Emotional lability: Secondary | ICD-10-CM | POA: Diagnosis not present

## 2019-10-22 DIAGNOSIS — C787 Secondary malignant neoplasm of liver and intrahepatic bile duct: Secondary | ICD-10-CM | POA: Diagnosis not present

## 2019-10-22 LAB — CBC WITH DIFFERENTIAL (CANCER CENTER ONLY)
Abs Immature Granulocytes: 0 10*3/uL (ref 0.00–0.07)
Basophils Absolute: 0 10*3/uL (ref 0.0–0.1)
Basophils Relative: 0 %
Eosinophils Absolute: 0.1 10*3/uL (ref 0.0–0.5)
Eosinophils Relative: 4 %
HCT: 33.9 % — ABNORMAL LOW (ref 36.0–46.0)
Hemoglobin: 10.4 g/dL — ABNORMAL LOW (ref 12.0–15.0)
Immature Granulocytes: 0 %
Lymphocytes Relative: 27 %
Lymphs Abs: 0.7 10*3/uL (ref 0.7–4.0)
MCH: 24.5 pg — ABNORMAL LOW (ref 26.0–34.0)
MCHC: 30.7 g/dL (ref 30.0–36.0)
MCV: 80 fL (ref 80.0–100.0)
Monocytes Absolute: 0.2 10*3/uL (ref 0.1–1.0)
Monocytes Relative: 7 %
Neutro Abs: 1.5 10*3/uL — ABNORMAL LOW (ref 1.7–7.7)
Neutrophils Relative %: 62 %
Platelet Count: 159 10*3/uL (ref 150–400)
RBC: 4.24 MIL/uL (ref 3.87–5.11)
RDW: 14 % (ref 11.5–15.5)
WBC Count: 2.5 10*3/uL — ABNORMAL LOW (ref 4.0–10.5)
nRBC: 0 % (ref 0.0–0.2)

## 2019-10-22 LAB — CMP (CANCER CENTER ONLY)
ALT: 14 U/L (ref 0–44)
AST: 17 U/L (ref 15–41)
Albumin: 3.6 g/dL (ref 3.5–5.0)
Alkaline Phosphatase: 122 U/L (ref 38–126)
Anion gap: 8 (ref 5–15)
BUN: 9 mg/dL (ref 6–20)
CO2: 27 mmol/L (ref 22–32)
Calcium: 9.2 mg/dL (ref 8.9–10.3)
Chloride: 108 mmol/L (ref 98–111)
Creatinine: 0.62 mg/dL (ref 0.44–1.00)
GFR, Est AFR Am: >60 mL/min (ref >60)
GFR, Estimated: >60 mL/min (ref >60)
Glucose, Bld: 122 mg/dL — ABNORMAL HIGH (ref 70–99)
Potassium: 4 mmol/L (ref 3.5–5.1)
Sodium: 143 mmol/L (ref 135–145)
Total Bilirubin: 0.5 mg/dL (ref 0.3–1.2)
Total Protein: 6.9 g/dL (ref 6.5–8.1)

## 2019-10-22 LAB — MAGNESIUM: Magnesium: 1.6 mg/dL — ABNORMAL LOW (ref 1.7–2.4)

## 2019-10-22 LAB — CEA (IN HOUSE-CHCC): CEA (CHCC-In House): 2.38 ng/mL (ref 0.00–5.00)

## 2019-10-22 MED ORDER — SODIUM CHLORIDE 0.9% FLUSH
10.0000 mL | INTRAVENOUS | Status: DC | PRN
  Administered 2019-10-22: 10 mL via INTRAVENOUS
  Filled 2019-10-22: qty 10

## 2019-10-22 MED ORDER — HEPARIN SOD (PORK) LOCK FLUSH 100 UNIT/ML IV SOLN
500.0000 [IU] | Freq: Once | INTRAVENOUS | Status: AC
  Administered 2019-10-22: 500 [IU] via INTRAVENOUS
  Filled 2019-10-22: qty 5

## 2019-10-22 MED ORDER — FLUTICASONE PROPIONATE 0.05 % EX CREA: TOPICAL_CREAM | Freq: Two times a day (BID) | CUTANEOUS | 1 refills | Status: DC

## 2019-10-22 MED ORDER — LIDOCAINE-PRILOCAINE 2.5-2.5 % EX CREA: 1.0000 "application " | TOPICAL_CREAM | CUTANEOUS | 1 refills | Status: DC

## 2019-10-22 NOTE — Progress Notes (Signed)
Email to [place] in radiology to import CT abd/pelvis from [facility] dated 08/19/19 to Epic.

## 2019-10-22 NOTE — Telephone Encounter (Signed)
Scheduled per 09/16 los, patient has received updated calender.

## 2019-10-22 NOTE — Progress Notes (Signed)
[place] OFFICE PROGRESS NOTE   Diagnosis: Colon cancer  INTERVAL HISTORY:   Ms. Chiong completed another cycle of irinotecan and Panitumumab on 10/08/2019.  She had an episode of nausea/vomiting on 10/09/2019 after eating at "[employer] ".  She did not receive G-CSF on 10/10/2019.  The left abdominal wound opened approximately 1 week ago.  She is now covering this with an ostomy bag.  She has a mild rash over the face.  She has mild discomfort at the left abdomen fistula site.  No recurrent seizure.  She has noticed that she is talking more with lack of a "filter "since the Keppra dose was increased.  She would like to see a neurologist to discuss dosing of the [place].  Objective:  Vital signs in last 24 hours:  Blood pressure 107/73, pulse 94, temperature 98.1 F (36.7 C), temperature source Tympanic, resp. rate 16, weight 142 lb 9.6 oz (64.7 kg), SpO2 100 %.    HEENT: No thrush or ulcers, geographic tongue Resp: Lungs clear bilaterally Cardio: Regular rate and rhythm GI: No hepatomegaly, left mid abdomen fistula covered by an ostomy bag with a small amount of drainage, mild tenderness in the left lateral abdomen, no mass Vascular: No leg edema  Skin: Mild acne type rash over the face, paronychia at the right great toe  Portacath/PICC-without erythema  Lab Results:  Lab Results  Component Value Date   WBC 2.5 (L) 10/22/2019   HGB 10.4 (L) 10/22/2019   HCT 33.9 (L) 10/22/2019   MCV 80.0 10/22/2019   PLT 159 10/22/2019   NEUTROABS 1.5 (L) 10/22/2019    CMP  Lab Results  Component Value Date   NA 143 10/22/2019   K 4.0 10/22/2019   CL 108 10/22/2019   CO2 27 10/22/2019   GLUCOSE 122 (H) 10/22/2019   BUN 9 10/22/2019   CREATININE 0.62 10/22/2019   CALCIUM 9.2 10/22/2019   PROT 6.9 10/22/2019   ALBUMIN 3.6 10/22/2019   AST 17 10/22/2019   ALT 14 10/22/2019   ALKPHOS 122 10/22/2019   BILITOT 0.5 10/22/2019   GFRNONAA >60 10/22/2019   GFRAA >60  10/22/2019    Lab Results  Component Value Date   CEA1 2.02 10/08/2019    Medications: I have reviewed the patient's current medications.   Assessment/Plan: 1. 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  The tumor returned microsatellite stable with no loss of mismatch repair protein expression   APC mutated. No BRAF, KRAS, or NRAS mutation On Foundation 1 testing   Cycle 1 adjuvant FOLFOX 09/08/2013   Cycle 2 adjuvant FOLFOX 09/24/2013   Cycle 3 adjuvant FOLFOX 10/08/2013.   Cycle 4 adjuvant FOLFOX 10/22/2013.   Cycle 5 adjuvant FOLFOX 11/05/2013. Oxaliplatin held due to thrombocytopenia.  Cycle 6 FOLFOX 11/19/2013.  Cycle 7 FOLFOX 12/03/2013. Oxaliplatin held secondary to thrombocytopenia.  Cycle 8 FOLFOX 12/17/2013.  Cycle 9 FOLFOX 01/04/2014. Oxaliplatin held secondary to neutropenia.   Cycle 10 FOLFOX 01/21/2014. Oxaliplatin held secondary to thrombocytopenia.  Cycle 11 FOLFOX 02/04/2014  Cycle 12 FOLFOX 02/18/2014, oxaliplatin dose reduced secondary tothrombocytopenia  CT abdomen/pelvis 01/30/2014 revealed splenomegaly and no evidence of recurrent colon cancer  CT chest 04/07/2014 with a stable right lower lobe nodule and no evidence for metastatic disease, no nodules seen on the CT 11/26/2014  Markedly elevated CEA 11/24/2014  CT 11/26/2014 revealed a right pelvic mass, splenomegaly, small volume ascites  Right salpingo-oophorectomy 12/28/2014 with the  pathology confirming metastatic colon cancer  CTs 03/23/2016-no evidence of recurrent or metastatic disease.  CT 11/26/2016-enlargement of a fluid density structure the right pelvic sidewall, no other evidence of metastatic disease  CT aspiration right pelvic cyst 12/19/2016.Cytology-BENIGN REACTIVE/REPARATIVE CHANGES.  CTs 06/05/2017-no evidence of metastatic disease, mild cirrhotic changes with  splenomegaly  CTs 12/11/2017-recurrent cystic right adnexal mass, similar to on the CT 11/26/2016, stable mild splenomegaly  CTs 04/23/2018-enlargement of cystic right adnexal mass with mural nodularity, no other evidence of metastatic disease  Cytoreductive surgery/HIPEC with mitomycin by Dr. Clovis Riley at [facility] 08/12/2018-R1 resection achieved. Cytoreduction included omentectomy, LAR, right salpingo-oophorectomy and left colonic gutter/pelvic stripping. Pathology on the rectum showed recurrent/metastatic adenocarcinoma, tumor 2.0 cm, predominantly involving the subserosa and muscularis propria of the colon, proximal and distal margins of resection were negative, vascular invasion present, metastatic carcinoma present in 1 out of 5 lymph nodes; omentum resection with no malignancy seen, no metastatic carcinoma identified in 1 lymph node examined; left gutter stripping positive metastatic adenocarcinoma; right ovary resection positive metastatic adenocarcinoma.  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  CEA 68 02/03/2019  Biopsy liver lesion 02/11/2019-metastatic adenocarcinoma consistent with primary colonic adenocarcinoma  CTs 02/27/2019-multiple liver lesions increased in size, new lesion in the lateral right lobe of the liver. No evidence of metastatic disease in the chest. Redemonstrated moderate left hydronephrosis and proximal hydroureter without discrete lesion or obstructing etiology at the transition point of the mid ureter.  Cycle 1 FOLFIRI/Panitumumab 03/12/2019  Cycle 2 FOLFIRI/Panitumumab 03/26/2019-bolus 5-FU and irinotecan held secondary to neutropenia  Cycle 3 FOLFIRI/Panitumumab 04/09/2019, Udenyca added-not given secondary to seizure/discontinuation of the 5-FU pump  CT abdomen/pelvis at [facility] 05/04/2019-no residual fluid collection at the left abdominal wall abscess, stable moderate left hydronephrosis,  multifocal indeterminate liver lesions--liver lesionssignificantly improved  Cycle 4 FOLFIRI/Panitumumab 05/07/2019, Udenyca  Cycle 5 FOLFIRI/Panitumumab 05/20/2019, Udenyca  Cycle 6 FOLFIRI/Panitumumab 06/18/2019, Udenyca  Cycle 7 FOLFIRI/Panitumumab 07/01/2019 (Irinotecan dose reduced due to thrombocytopenia), Udenyca--Udenyca was not given  Cycle 8 FOLFIRI/Panitumumab 07/15/2019, Udenyca  Cycle 9 FOLFIRI/Panitumumab 07/29/2019, Udenyca  Cycle 10 FOLFIRI/Panitumumab 08/13/2019, Udenyca  CTs at [facility] 08/19/2019-decreased size of the hypodense and hyperdense lesions within segment 2 of the left hepatic lobe. No new lesions identified. Similar presacral soft tissue thickening with adjacent stable alignment in the rectum. Improved but persistent left UPJ obstruction with mild left hydronephrosis. Persistent but decreased size of the wound within the left mid abdomen abdominal wall.  Cycle 11 irinotecan/Panitumumab 08/27/2019, Udenyca  Cycle 12 irinotecan/Panitumumab 10/08/2019, Udenyca 2. Mild elevation of the CEA beginning January 2016, normal on 05/19/2014   3. History of iron deficiency anemia  4. seizure disorder; seizure 04/09/2019. Brain CT negative. Now on Keppra. 5. history of depression  6. 4 mm right lower lobe nodule on a staging chest CT 09/08/2013 , stable on a CT 04/07/2014 7. Hospitalization 09/24/2013 through 09/26/2013 with fever and abdominal pain.  8. 09/24/2013 urine culture positive for coag negative staph.  9. 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 Splenomegaly noted on a CT scan 01/30/2014,persistent on repeat CTs 12. Colonoscopy 11/17/2018-flexible sigmoidoscopy per rectum with changes of mild diversion colitis. Scope advanced for approximately 25 cm. Most proximal portion had necrotic appearing debris. Colostomy bag insufflated suggesting some type of communication  between the pouch and the proximal colon. Introduction of scope into the ostomy found to available directions. 1 toward the distal pouch with similar appearing necrotic  debris encountered. The other was about a 30 cm segment of normal-appearing colonic mucosa to the level of the previous right hemicolectomy ileocolonic anastomosis. 52. Port-A-Cath placement interventional radiology 03/05/2019 14. Left leg/foot weakness-brain MRI 09/03/2019 with no evidence of metastatic disease, referred to physical therapy 15. 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 COVID-19 + 09/10/2019 17. Hospitalized with seizure 09/17/2019 through 09/19/2019-MRI brain without evidence of metastatic disease. Seen by neurology. Keppra dose increased.    Disposition: [name] appears well today.  She does not wish to receive chemotherapy today as she plans to attend a Cancer center appointment with her mother.  She would like to keep the chemotherapy as scheduled on 11/05/2019.  We will schedule a restaging CT evaluation prior to her appointment with Dr. Clovis Riley next month.  We will refer her to neurology for management of the epilepsy.  Betsy Coder, MD  10/22/2019  9:08 AM

## 2019-10-27 ENCOUNTER — Encounter: Payer: Self-pay | Admitting: Neurology

## 2019-11-01 ENCOUNTER — Other Ambulatory Visit: Payer: Self-pay | Admitting: Oncology

## 2019-11-05 ENCOUNTER — Inpatient Hospital Stay: Payer: BC Managed Care – PPO

## 2019-11-05 ENCOUNTER — Inpatient Hospital Stay (HOSPITAL_BASED_OUTPATIENT_CLINIC_OR_DEPARTMENT_OTHER): Payer: BC Managed Care – PPO | Admitting: Oncology

## 2019-11-05 ENCOUNTER — Telehealth: Payer: Self-pay | Admitting: Oncology

## 2019-11-05 ENCOUNTER — Other Ambulatory Visit: Payer: Self-pay

## 2019-11-05 ENCOUNTER — Encounter: Payer: Self-pay | Admitting: Oncology

## 2019-11-05 VITALS — BP 125/82 | HR 75 | Temp 98.0°F | Resp 18 | Ht 66.0 in | Wt 149.5 lb

## 2019-11-05 DIAGNOSIS — C787 Secondary malignant neoplasm of liver and intrahepatic bile duct: Secondary | ICD-10-CM | POA: Diagnosis not present

## 2019-11-05 DIAGNOSIS — Z5112 Encounter for antineoplastic immunotherapy: Secondary | ICD-10-CM | POA: Diagnosis not present

## 2019-11-05 DIAGNOSIS — C182 Malignant neoplasm of ascending colon: Secondary | ICD-10-CM

## 2019-11-05 DIAGNOSIS — G479 Sleep disorder, unspecified: Secondary | ICD-10-CM | POA: Diagnosis not present

## 2019-11-05 DIAGNOSIS — Z95828 Presence of other vascular implants and grafts: Secondary | ICD-10-CM

## 2019-11-05 DIAGNOSIS — Z5111 Encounter for antineoplastic chemotherapy: Secondary | ICD-10-CM | POA: Diagnosis not present

## 2019-11-05 DIAGNOSIS — D509 Iron deficiency anemia, unspecified: Secondary | ICD-10-CM | POA: Diagnosis not present

## 2019-11-05 DIAGNOSIS — R4586 Emotional lability: Secondary | ICD-10-CM | POA: Diagnosis not present

## 2019-11-05 DIAGNOSIS — Z452 Encounter for adjustment and management of vascular access device: Secondary | ICD-10-CM | POA: Diagnosis not present

## 2019-11-05 LAB — CBC WITH DIFFERENTIAL (CANCER CENTER ONLY)
Abs Immature Granulocytes: 0.01 10*3/uL (ref 0.00–0.07)
Basophils Absolute: 0 10*3/uL (ref 0.0–0.1)
Basophils Relative: 0 %
Eosinophils Absolute: 0.1 10*3/uL (ref 0.0–0.5)
Eosinophils Relative: 2 %
HCT: 34.7 % — ABNORMAL LOW (ref 36.0–46.0)
Hemoglobin: 10.8 g/dL — ABNORMAL LOW (ref 12.0–15.0)
Immature Granulocytes: 0 %
Lymphocytes Relative: 22 %
Lymphs Abs: 0.7 10*3/uL (ref 0.7–4.0)
MCH: 24.6 pg — ABNORMAL LOW (ref 26.0–34.0)
MCHC: 31.1 g/dL (ref 30.0–36.0)
MCV: 79 fL — ABNORMAL LOW (ref 80.0–100.0)
Monocytes Absolute: 0.3 10*3/uL (ref 0.1–1.0)
Monocytes Relative: 8 %
Neutro Abs: 2.2 10*3/uL (ref 1.7–7.7)
Neutrophils Relative %: 68 %
Platelet Count: 154 10*3/uL (ref 150–400)
RBC: 4.39 MIL/uL (ref 3.87–5.11)
RDW: 14.3 % (ref 11.5–15.5)
WBC Count: 3.3 10*3/uL — ABNORMAL LOW (ref 4.0–10.5)
nRBC: 0 % (ref 0.0–0.2)

## 2019-11-05 LAB — CMP (CANCER CENTER ONLY)
ALT: 24 U/L (ref 0–44)
AST: 22 U/L (ref 15–41)
Albumin: 3.5 g/dL (ref 3.5–5.0)
Alkaline Phosphatase: 117 U/L (ref 38–126)
Anion gap: 6 (ref 5–15)
BUN: 10 mg/dL (ref 6–20)
CO2: 27 mmol/L (ref 22–32)
Calcium: 9.5 mg/dL (ref 8.9–10.3)
Chloride: 108 mmol/L (ref 98–111)
Creatinine: 0.72 mg/dL (ref 0.44–1.00)
GFR, Est AFR Am: >60 mL/min (ref >60)
GFR, Estimated: >60 mL/min (ref >60)
Glucose, Bld: 127 mg/dL — ABNORMAL HIGH (ref 70–99)
Potassium: 4.2 mmol/L (ref 3.5–5.1)
Sodium: 141 mmol/L (ref 135–145)
Total Bilirubin: 0.6 mg/dL (ref 0.3–1.2)
Total Protein: 6.6 g/dL (ref 6.5–8.1)

## 2019-11-05 LAB — MAGNESIUM: Magnesium: 1.7 mg/dL (ref 1.7–2.4)

## 2019-11-05 LAB — CEA (IN HOUSE-CHCC): CEA (CHCC-In House): 1.42 ng/mL (ref 0.00–5.00)

## 2019-11-05 MED ORDER — HEPARIN SOD (PORK) LOCK FLUSH 100 UNIT/ML IV SOLN
500.0000 [IU] | Freq: Once | INTRAVENOUS | Status: AC | PRN
  Administered 2019-11-05: 500 [IU]
  Filled 2019-11-05: qty 5

## 2019-11-05 MED ORDER — SODIUM CHLORIDE 0.9% FLUSH
10.0000 mL | INTRAVENOUS | Status: DC | PRN
  Administered 2019-11-05: 10 mL
  Filled 2019-11-05: qty 10

## 2019-11-05 MED ORDER — SODIUM CHLORIDE 0.9 % IV SOLN
Freq: Once | INTRAVENOUS | Status: AC
  Filled 2019-11-05: qty 250

## 2019-11-05 MED ORDER — SODIUM CHLORIDE 0.9 % IV SOLN
140.0000 mg/m2 | Freq: Once | INTRAVENOUS | Status: AC
  Administered 2019-11-05: 260 mg via INTRAVENOUS
  Filled 2019-11-05: qty 13

## 2019-11-05 MED ORDER — SODIUM CHLORIDE 0.9 % IV SOLN
10.0000 mg | Freq: Once | INTRAVENOUS | Status: AC
  Administered 2019-11-05: 10 mg via INTRAVENOUS
  Filled 2019-11-05: qty 10

## 2019-11-05 MED ORDER — PALONOSETRON HCL INJECTION 0.25 MG/5ML
INTRAVENOUS | Status: AC
  Filled 2019-11-05: qty 5

## 2019-11-05 MED ORDER — SODIUM CHLORIDE 0.9% FLUSH
10.0000 mL | Freq: Once | INTRAVENOUS | Status: AC
  Administered 2019-11-05: 10 mL
  Filled 2019-11-05: qty 10

## 2019-11-05 MED ORDER — SODIUM CHLORIDE 0.9 % IV SOLN
6.0000 mg/kg | Freq: Once | INTRAVENOUS | Status: AC
  Administered 2019-11-05: 400 mg via INTRAVENOUS
  Filled 2019-11-05: qty 20

## 2019-11-05 MED ORDER — PALONOSETRON HCL INJECTION 0.25 MG/5ML
0.2500 mg | Freq: Once | INTRAVENOUS | Status: AC
  Administered 2019-11-05: 0.25 mg via INTRAVENOUS

## 2019-11-05 MED ORDER — ATROPINE SULFATE 1 MG/ML IJ SOLN
0.5000 mg | Freq: Once | INTRAMUSCULAR | Status: AC | PRN
  Administered 2019-11-05: 0.5 mg via INTRAVENOUS

## 2019-11-05 MED ORDER — ATROPINE SULFATE 1 MG/ML IJ SOLN
INTRAMUSCULAR | Status: AC
  Filled 2019-11-05: qty 1

## 2019-11-05 NOTE — Telephone Encounter (Signed)
No new appointments scheduled per 9/30 los.

## 2019-11-05 NOTE — Patient Instructions (Signed)

## 2019-11-05 NOTE — Progress Notes (Signed)
[name]   Diagnosis: Colon cancer  INTERVAL HISTORY:   [name] returns as scheduled.  She generally feels well.  She has noted difficulty sleeping and emotional lability with the increased dose of Keppra.  No seizure.  She is scheduled to see neurology in December. The fistula opened yesterday.  She has discomfort at the right great toe paronychia.  Objective:  Vital signs in last 24 hours:  There were no vitals taken for this visit.    Resp: Lungs clear bilaterally Cardio: Regular rate and rhythm GI: No hepatomegaly, small opening at the left mid abdominal wall with mild surrounding erythema Vascular: No leg edema  Skin: Paronychia at the great toe bilaterally, minimal rash over the chest and face  Portacath/PICC-without erythema  Lab Results:  Lab Results  Component Value Date   WBC 3.3 (L) 11/05/2019   HGB 10.8 (L) 11/05/2019   HCT 34.7 (L) 11/05/2019   MCV 79.0 (L) 11/05/2019   PLT 154 11/05/2019   NEUTROABS 2.2 11/05/2019    CMP  Lab Results  Component Value Date   NA 141 11/05/2019   K 4.2 11/05/2019   CL 108 11/05/2019   CO2 27 11/05/2019   GLUCOSE 127 (H) 11/05/2019   BUN 10 11/05/2019   CREATININE 0.72 11/05/2019   CALCIUM 9.5 11/05/2019   PROT 6.6 11/05/2019   ALBUMIN 3.5 11/05/2019   AST 22 11/05/2019   ALT 24 11/05/2019   ALKPHOS 117 11/05/2019   BILITOT 0.6 11/05/2019   GFRNONAA >60 11/05/2019   GFRAA >60 11/05/2019    Lab Results  Component Value Date   CEA1 2.38 10/22/2019     Medications: I have reviewed the patient's current medications.   Assessment/Plan: 1. 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  The tumor returned microsatellite stable with no loss of mismatch repair protein expression   APC mutated. No BRAF, KRAS, or NRAS mutation On Foundation 1 testing   Cycle 1 adjuvant  FOLFOX 09/08/2013   Cycle 2 adjuvant FOLFOX 09/24/2013   Cycle 3 adjuvant FOLFOX 10/08/2013.   Cycle 4 adjuvant FOLFOX 10/22/2013.   Cycle 5 adjuvant FOLFOX 11/05/2013. Oxaliplatin held due to thrombocytopenia.  Cycle 6 FOLFOX 11/19/2013.  Cycle 7 FOLFOX 12/03/2013. Oxaliplatin held secondary to thrombocytopenia.  Cycle 8 FOLFOX 12/17/2013.  Cycle 9 FOLFOX 01/04/2014. Oxaliplatin held secondary to neutropenia.   Cycle 10 FOLFOX 01/21/2014. Oxaliplatin held secondary to thrombocytopenia.  Cycle 11 FOLFOX 02/04/2014  Cycle 12 FOLFOX 02/18/2014, oxaliplatin dose reduced secondary tothrombocytopenia  CT abdomen/pelvis 01/30/2014 revealed splenomegaly and no evidence of recurrent colon cancer  CT chest 04/07/2014 with a stable right lower lobe nodule and no evidence for metastatic disease, no nodules seen on the CT 11/26/2014  Markedly elevated CEA 11/24/2014  CT 11/26/2014 revealed a right pelvic mass, splenomegaly, small volume ascites  Right salpingo-oophorectomy 12/28/2014 with the pathology confirming metastatic colon cancer  CTs 03/23/2016-no evidence of recurrent or metastatic disease.  CT 11/26/2016-enlargement of a fluid density structure the right pelvic sidewall, no other evidence of metastatic disease  CT aspiration right pelvic cyst 12/19/2016.Cytology-BENIGN REACTIVE/REPARATIVE CHANGES.  CTs 06/05/2017-no evidence of metastatic disease, mild cirrhotic changes with splenomegaly  CTs 12/11/2017-recurrent cystic right adnexal mass, similar to on the CT 11/26/2016, stable mild splenomegaly  CTs 04/23/2018-enlargement of cystic right adnexal mass with mural nodularity, no other evidence of metastatic disease  Cytoreductive surgery/HIPEC with mitomycin by Dr. Clovis Riley at [facility]  08/12/2018-R1 resection achieved. Cytoreduction included omentectomy, LAR, right salpingo-oophorectomy and left colonic gutter/pelvic stripping. Pathology on the rectum showed  recurrent/metastatic adenocarcinoma, tumor 2.0 cm, predominantly involving the subserosa and muscularis propria of the colon, proximal and distal margins of resection were negative, vascular invasion present, metastatic carcinoma present in 1 out of 5 lymph nodes; omentum resection with no malignancy seen, no metastatic carcinoma identified in 1 lymph node examined; left gutter stripping positive metastatic adenocarcinoma; right ovary resection positive metastatic adenocarcinoma.  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  CEA 68 02/03/2019  Biopsy liver lesion 02/11/2019-metastatic adenocarcinoma consistent with primary colonic adenocarcinoma  CTs 02/27/2019-multiple liver lesions increased in size, new lesion in the lateral right lobe of the liver. No evidence of metastatic disease in the chest. Redemonstrated moderate left hydronephrosis and proximal hydroureter without discrete lesion or obstructing etiology at the transition point of the mid ureter.  Cycle 1 FOLFIRI/Panitumumab 03/12/2019  Cycle 2 FOLFIRI/Panitumumab 03/26/2019-bolus 5-FU and irinotecan held secondary to neutropenia  Cycle 3 FOLFIRI/Panitumumab 04/09/2019, Udenyca added-not given secondary to seizure/discontinuation of the 5-FU pump  CT abdomen/pelvis at [facility] 05/04/2019-no residual fluid collection at the left abdominal wall abscess, stable moderate left hydronephrosis, multifocal indeterminate liver lesions--liver lesionssignificantly improved  Cycle 4 FOLFIRI/Panitumumab 05/07/2019, Udenyca  Cycle 5 FOLFIRI/Panitumumab 05/20/2019, Udenyca  Cycle 6 FOLFIRI/Panitumumab 06/18/2019, Udenyca  Cycle 7 FOLFIRI/Panitumumab 07/01/2019 (Irinotecan dose reduced due to thrombocytopenia), Udenyca--Udenyca was not given  Cycle 8 FOLFIRI/Panitumumab 07/15/2019, Udenyca  Cycle 9 FOLFIRI/Panitumumab 07/29/2019, Udenyca  Cycle 10 FOLFIRI/Panitumumab 08/13/2019,  Udenyca  CTs at [facility] 08/19/2019-decreased size of the hypodense and hyperdense lesions within segment 2 of the left hepatic lobe. No new lesions identified. Similar presacral soft tissue thickening with adjacent stable alignment in the rectum. Improved but persistent left UPJ obstruction with mild left hydronephrosis. Persistent but decreased size of the wound within the left mid abdomen abdominal wall.  Cycle 11 irinotecan/Panitumumab 08/27/2019, Udenyca  Cycle 12 irinotecan/Panitumumab 10/08/2019, Udenyca  Cycle 13 irinotecan/Panitumumab 11/05/2019, Udenyca 2. Mild elevation of the CEA beginning January 2016, normal on 05/19/2014   3. History of iron deficiency anemia  4. seizure disorder; seizure 04/09/2019. Brain CT negative. Now on Keppra. 5. history of depression  6. 4 mm right lower lobe nodule on a staging chest CT 09/08/2013 , stable on a CT 04/07/2014 7. Hospitalization 09/24/2013 through 09/26/2013 with fever and abdominal pain.  8. 09/24/2013 urine culture positive for coag negative staph.  9. 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 Splenomegaly noted on a CT scan 01/30/2014,persistent on repeat CTs 12. Colonoscopy 11/17/2018-flexible sigmoidoscopy per rectum with changes of mild diversion colitis. Scope advanced for approximately 25 cm. Most proximal portion had necrotic appearing debris. Colostomy bag insufflated suggesting some type of communication between the pouch and the proximal colon. Introduction of scope into the ostomy found to available directions. 1 toward the distal pouch with similar appearing necrotic debris encountered. The other was about a 30 cm segment of normal-appearing colonic mucosa to the level of the previous right hemicolectomy ileocolonic anastomosis. 80. Port-A-Cath placement interventional radiology 03/05/2019 14. Left leg/foot weakness-brain  MRI 09/03/2019 with no evidence of metastatic disease, referred to physical therapy 15. 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 COVID-19 + 09/10/2019 17. Hospitalized with seizure 09/17/2019 through 09/19/2019-MRI brain without evidence of metastatic disease. Seen by neurology. Keppra dose increased.      Disposition: Ms. Larrick appears stable.  She will resume treatment with irinotecan/Panitumumab today.  She will return for an office visit and restaging CT on 11/19/2019.  I will contact Dr. Tomi Likens to see if he can see her sooner.  She will continue soaking, Neosporin, and she will try fluticasone cream at the toes.  Emily Coder, MD  11/05/2019  8:56 AM

## 2019-11-07 ENCOUNTER — Other Ambulatory Visit: Payer: Self-pay

## 2019-11-07 ENCOUNTER — Inpatient Hospital Stay: Payer: BC Managed Care – PPO | Attending: Oncology

## 2019-11-07 VITALS — BP 114/65 | HR 74 | Temp 98.2°F | Resp 20

## 2019-11-07 DIAGNOSIS — Z5189 Encounter for other specified aftercare: Secondary | ICD-10-CM | POA: Insufficient documentation

## 2019-11-07 DIAGNOSIS — C182 Malignant neoplasm of ascending colon: Secondary | ICD-10-CM | POA: Diagnosis not present

## 2019-11-07 DIAGNOSIS — C787 Secondary malignant neoplasm of liver and intrahepatic bile duct: Secondary | ICD-10-CM | POA: Insufficient documentation

## 2019-11-07 DIAGNOSIS — Z5112 Encounter for antineoplastic immunotherapy: Secondary | ICD-10-CM | POA: Insufficient documentation

## 2019-11-07 DIAGNOSIS — Z5111 Encounter for antineoplastic chemotherapy: Secondary | ICD-10-CM | POA: Insufficient documentation

## 2019-11-07 DIAGNOSIS — G620 Drug-induced polyneuropathy: Secondary | ICD-10-CM | POA: Insufficient documentation

## 2019-11-07 MED ORDER — PEGFILGRASTIM-CBQV 6 MG/0.6ML ~~LOC~~ SOSY
6.0000 mg | PREFILLED_SYRINGE | Freq: Once | SUBCUTANEOUS | Status: AC
  Administered 2019-11-07: 6 mg via SUBCUTANEOUS

## 2019-11-10 NOTE — Progress Notes (Addendum)
NEUROLOGY CONSULTATION NOTE  AMANDINE COVINO MRN: [id number] DOB: December 10, 1975  Referring provider: Betsy Coder, MD Primary care provider: Betsy Coder, MD  Reason for consult:  Seizure disorder  HISTORY OF PRESENT ILLNESS: [name] is a 31 old female with metastatic colon cancer, depression, anxiety, migraine and restless leg syndrome who presents for idiopathic generalized epilepsy.  She began having seizures in 2009.  There was no precipitating factor.  Her seizures present as generalized tonic-clonic.  Reportedly, she has had several seizures while asleep, as she sometimes wakes up having bit her tongue but no blood on the pillow.  No witnesses to seizure activity at night.  She usually has migraine the next day.  MRI of brain with and without contrast on 09/10/2008 was unremarkable.  EEG from 11/08/2010 showed generalized polyspike and wave discharged.    She established care with me in July 2014 after having a seizure.  At the time, she was on Keppra XR 1500mg  twice daily.  Due to mood changes, she was switched to lamotrigine.  She was last seen by me in July 2015.  She had a lot going on and subsequently stopped her medication and was lost to follow-up.  She was seizure-free until March 2021 when she had a seizure.  It was suspected that it may have been provoked by Levaquin which she was taking to treat an abscess from her colostomy reversal.  EEG showed generalized epileptiform activity.  She was restarted on Keppra 500mg  twice daily.  MRI of brain with and without contrast on 09/03/2019 personally reviewed was unremarkable.  She was diagnosed with COVID-19 in August 2021.  A week later, she was admitted to the hospital for multiple seizures, diagnosed with status epilepticus. Limited MRI of brain without contrast was unremarkable.  EEG showed generalized and left frontotemporal spikes and polyspike waves.  Keppra was increased to 1500mg  twice daily.  Since then, she reports  irritability, impulsiveness (speaking without a filter), and trouble sleeping.  She would like to change medication.   Previous medications include phenobarbital, but she cannot recall if she had been on anything else.  Records mention carbamazepine.  Lamotrigine.    PAST MEDICAL HISTORY: Past Medical History:  Diagnosis Date  . ADD (attention deficit disorder)   . Anemia, iron deficiency   . Anxiety   . Colon cancer ([place]) 08/2013  . Depression   . Difficulty swallowing pills   . GERD (gastroesophageal reflux disease)   . Headache    migraines "long time ago"  . History of blood transfusion 08/16/2013  . History of migraine    no problems in "a long time"  . 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 Restless leg syndrome   . Seizures ([place])    last seizure 08/2012    PAST SURGICAL HISTORY: Past Surgical History:  Procedure Laterality Date  . ABDOMINAL HYSTERECTOMY  08/24/2004   partial  . APPENDECTOMY  08/24/2004  . COLON SURGERY  08/11/2013   removed a foot of colon  . COLONOSCOPY  2019  . colonoscopy 10-18-14    . COLONOSCOPY WITH PROPOFOL  07/07/2013  . CYSTOSCOPY  11/01/2003  . ENDOMETRIAL FULGURATION  11/01/2003  . ESOPHAGOGASTRODUODENOSCOPY  07/07/2013  . IR IMAGING GUIDED PORT INSERTION  03/05/2019  . LAPAROSCOPIC LYSIS OF ADHESIONS  11/01/2003  . LAPAROSCOPIC PARTIAL COLECTOMY Right 08/11/2013   Procedure: LAPAROSCOPIC RIGHT HEMICOLECTOMY;  Surgeon: Stark Klein, MD;  Location: [facility];  Service: General;  Laterality: Right;  . LAPAROTOMY  N/A 12/28/2014   Procedure: EXPLORATORY LAPAROTOMY;  Surgeon: Everitt Amber, MD;  Location: [facility];  Service: Gynecology;  Laterality: N/A;  . LEFT OOPHORECTOMY  08/24/2004  . PANNICULECTOMY  08/24/2004  . PORT-A-CATH REMOVAL Left 06/08/2014   Procedure: REMOVAL PORT-A-CATH;  Surgeon: Stark Klein, MD;  Location: [facility];  Service: General;  Laterality: Left;  . PORTACATH PLACEMENT Left 09/09/2013   Procedure: INSERTION PORT-A-CATH;   Surgeon: Stark Klein, MD;  Location: [facility];  Service: General;  Laterality: Left;  . SALPINGOOPHORECTOMY Right 12/28/2014   Procedure: RIGHT SALPINGO OOPHORECTOMY;  Surgeon: Everitt Amber, MD;  Location: [facility];  Service: Gynecology;  Laterality: Right;  . TUBAL LIGATION  11/07/2000  . UNILATERAL SALPINGECTOMY Left 08/24/2004  . WISDOM TOOTH EXTRACTION Bilateral 1994    MEDICATIONS: Current Outpatient Medications on File Prior to Visit  Medication Sig Dispense Refill  . acetaminophen (TYLENOL) 500 MG tablet Take 1,000 mg by mouth every 6 (six) hours as needed.    . diphenoxylate-atropine (LOMOTIL) 2.5-0.025 MG tablet TAKE 1- 2 TABLETS BY MOUTH 4 TIMES DAILY AS NEEDED FOR DIARRHEA /  LOOSE  STOOLS (Patient not taking: Reported on 11/05/2019) 60 tablet 1  . fluticasone (CUTIVATE) 0.05 % cream Apply topically 2 (two) times daily. To rash 30 g 1  . levETIRAcetam (KEPPRA) 750 MG tablet Take 2 tablets (1,500 mg total) by mouth 2 (two) times daily. 120 tablet 2  . lidocaine-prilocaine (EMLA) cream Apply 1 application topically as directed. Apply to port site 1 hour prior to stick and cover with plastic wrap 30 g 1  . LORazepam (ATIVAN) 0.5 MG tablet Take 1 tablet (0.5 mg total) by mouth 2 (two) times daily as needed for anxiety. 60 tablet 0  . magnesium oxide (MAG-OX) 400 (241.3 Mg) MG tablet Take 1 tablet (400 mg total) by mouth 2 (two) times daily. 60 tablet 2   No current facility-administered medications on file prior to visit.    ALLERGIES: Allergies  Allergen Reactions  . Adhesive [Tape] Hives, Shortness Of Breath and Swelling    Tolerates paper tape  . Clindamycin/Lincomycin Itching and Rash    Generalized body rash with itching, lip ulcers  . Promethazine Other (See Comments)    Increase restless leg movement   . Eggs Or Egg-Derived Products Nausea And Vomiting  . Metoclopramide Hcl Other (See Comments)    EXACERBATES RESTLESS LEG SYNDROME  . Oxycodone-Acetaminophen  Hives  . Sulfa Antibiotics Nausea And Vomiting  . Levaquin [Levofloxacin]     seizures  . Penicillins Itching    Has patient had a PCN reaction causing immediate rash, facial/tongue/throat swelling, SOB or lightheadedness with hypotension: No Has patient had a PCN reaction causing severe rash involving mucus membranes or skin necrosis: No Has patient had a PCN reaction that required hospitalization No Has patient had a PCN reaction occurring within the last 10 years: No If all of the above answers are "NO", then may proceed with Cephalosporin use.     FAMILY HISTORY: Family History  Problem Relation Age of Onset  . Colon polyps Mother        3+ colon polyps at each colonoscopy - "several"  . Colitis Mother   . Heart attack Mother   . Diabetes Mother   . Heart disease Mother   . Diabetes Father   . Heart attack Father   . Stroke Father   . Congestive Heart Failure Father   . Heart disease Father   . Cirrhosis Maternal Aunt   .  Lung cancer Maternal Uncle        d. 65s; smoker and worked around asbestos  . Other Paternal Grandfather        "bone cancer"  . Colon cancer Maternal Uncle        dx early 11s  . Breast cancer Maternal Aunt        dx 50s-60s; s/p lumpectomy  . Dementia Maternal Aunt   . Other Cousin        maternal 1st cousin w/ "lung issues"  . Dementia Paternal Aunt   . Dementia Paternal Uncle   . Prostate cancer Paternal Uncle 57  . Esophageal cancer Neg Hx   . Rectal cancer Neg Hx   . Stomach cancer Neg Hx     SOCIAL HISTORY: Social History   Socioeconomic History  . Marital status: Married    Spouse name: Not on file  . Number of children: 2  . Years of education: Not on file  . Highest education level: Not on file  Occupational History  . Occupation: Unemployed- full time student    Comment: Completed 12th grade  Tobacco Use  . Smoking status: Never Smoker  . Smokeless tobacco: Never Used  Vaping Use  . Vaping Use: Never used  Substance and  Sexual Activity  . Alcohol use: No  . Drug use: No  . Sexual activity: Yes    Birth control/protection: Surgical  Other Topics Concern  . Not on file  Social History Narrative  . Not on file   Social Determinants of Health   Financial Resource Strain:   . Difficulty of Paying Living Expenses: Not on file  Food Insecurity:   . Worried About Charity fundraiser in the Last Year: Not on file  . Ran Out of Food in the Last Year: Not on file  Transportation Needs:   . Lack of Transportation (Medical): Not on file  . Lack of Transportation (Non-Medical): Not on file  Physical Activity:   . Days of Exercise per Week: Not on file  . Minutes of Exercise per Session: Not on file  Stress:   . Feeling of Stress : Not on file  Social Connections:   . Frequency of Communication with Friends and Family: Not on file  . Frequency of Social Gatherings with Friends and Family: Not on file  . Attends Religious Services: Not on file  . Active Member of Clubs or Organizations: Not on file  . Attends Archivist Meetings: Not on file  . Marital Status: Not on file  Intimate Partner Violence:   . Fear of Current or Ex-Partner: Not on file  . Emotionally Abused: Not on file  . Physically Abused: Not on file  . Sexually Abused: Not on file    PHYSICAL EXAM: Blood pressure 129/86, pulse 87, height 5\' 6"  (1.676 m), weight 144 lb 6.4 oz (65.5 kg), SpO2 99 %. General: No acute distress.  Patient appears well-groomed.  Head:  Normocephalic/atraumatic Eyes:  fundi examined but not visualized Neck: supple, no paraspinal tenderness, full range of motion Back: No paraspinal tenderness Heart: regular rate and rhythm Lungs: Clear to auscultation bilaterally. Vascular: No carotid bruits. Neurological Exam: Mental status: alert and oriented to person, place, and time, recent and remote memory intact, fund of knowledge intact, attention and concentration intact, speech fluent and not dysarthric,  language intact. Cranial nerves: CN I: not tested CN II: pupils equal, round and reactive to light, visual fields intact CN III, IV, VI:  full range of motion, no nystagmus, no ptosis CN V: facial sensation intact CN VII: upper and lower face symmetric CN VIII: hearing intact CN IX, X: gag intact, uvula midline CN XI: sternocleidomastoid and trapezius muscles intact CN XII: tongue midline Bulk & Tone: normal, no fasciculations. Motor:  5/5 throughout  Sensation:  Pinprick and vibration sensation intact. Deep Tendon Reflexes:  2+ throughout, toes downgoing.  Finger to nose testing:  Without dysmetria.  Heel to shin:  Without dysmetria.  Gait:  Normal station and stride.  Romberg negative.  IMPRESSION: Primary generalized epilepsy  PLAN: 1.  Due to side effects, plan to transition from Keppra to Vimpat: -  Start Vimpat 50mg  twice daily for a week, then 100mg  twice daily for a week, then 200mg  twice daily -  Continue Keppra 1500mg  twice daily.  Once she starts Vimpat 200mg  twice daily, she will decrease Keppra to 1000mg  twice daily for a week, then 500mg  twice daily for a week, then 500mg  daily for a week, then STOP 2. I informed her about [place] law stating that she must be seizure-free for 6 months before she can resume driving. 3.  Follow up in 4 months.  Thank you for allowing me to take part in the care of this patient.  Emily Clines, DO  CC:  Emily Coder, MD

## 2019-11-12 ENCOUNTER — Encounter: Payer: Self-pay | Admitting: Neurology

## 2019-11-12 ENCOUNTER — Ambulatory Visit: Payer: BC Managed Care – PPO | Admitting: Neurology

## 2019-11-12 ENCOUNTER — Other Ambulatory Visit: Payer: Self-pay

## 2019-11-12 VITALS — BP 129/86 | HR 87 | Ht 66.0 in | Wt 144.4 lb

## 2019-11-12 DIAGNOSIS — G40309 Generalized idiopathic epilepsy and epileptic syndromes, not intractable, without status epilepticus: Secondary | ICD-10-CM

## 2019-11-12 MED ORDER — LEVETIRACETAM 500 MG PO TABS: 500.0000 mg | ORAL_TABLET | Freq: Two times a day (BID) | ORAL | 0 refills | Status: DC

## 2019-11-12 MED ORDER — LACOSAMIDE 100 MG PO TABS: ORAL_TABLET | ORAL | 0 refills | Status: DC

## 2019-11-12 NOTE — Patient Instructions (Signed)
I will transition you from levetiracetam (Keppra) to lacosamide (Vimpat)  Lacosamide 100mg  tablet: Take 1/2 tablet twice daily for one week, Then 1 tablet twice daily for one week, Then 2 tablets twice daily thereafter  Levetiracetam 500mg  tablet: Once you start lacosamide 200mg  twice daily, start the 500mg  tablet of levetiracetam - Take 2 tablets twice daily for one week Then 1 tablet twice daily for one week Then 1 tablet once daily for one week Then STOP  1. If medication has been prescribed for you to prevent seizures, take it exactly as directed.  Do not stop taking the medicine without talking to your doctor first, even if you have not had a seizure in a long time.   2. Avoid activities in which a seizure would cause danger to yourself or to others.  Don't operate dangerous machinery, swim alone, or climb in high or dangerous places, such as on ladders, roofs, or girders.  Do not drive unless your doctor says you may.  3. If you have any warning that you may have a seizure, lay down in a safe place where you can't hurt yourself.    4.  No driving for 6 months from last seizure, as per [facility].   Please refer to the following link on the [place] website for more information: http://[email or web address]/[email or web address]/driving/drivingu.cfm   5.  Maintain good sleep hygiene.  6.  Notify your neurology if you are planning pregnancy or if you become pregnant.  7.  Contact your doctor if you have any problems that may be related to the medicine you are taking.  8.  Call 911 and bring the patient back to the ED if:        A.  The seizure lasts longer than 5 minutes.       B.  The patient doesn't awaken shortly after the seizure  C.  The patient has new problems such as difficulty seeing, speaking or moving  D.  The patient was injured during the seizure  E.  The patient has a temperature over 102 F (39C)  F.  The patient vomited and  now is having trouble breathing

## 2019-11-14 ENCOUNTER — Other Ambulatory Visit: Payer: Self-pay | Admitting: Oncology

## 2019-11-18 ENCOUNTER — Other Ambulatory Visit: Payer: Self-pay

## 2019-11-18 DIAGNOSIS — C182 Malignant neoplasm of ascending colon: Secondary | ICD-10-CM

## 2019-11-19 ENCOUNTER — Encounter: Payer: Self-pay | Admitting: Nurse Practitioner

## 2019-11-19 ENCOUNTER — Inpatient Hospital Stay (HOSPITAL_BASED_OUTPATIENT_CLINIC_OR_DEPARTMENT_OTHER): Payer: BC Managed Care – PPO | Admitting: Nurse Practitioner

## 2019-11-19 ENCOUNTER — Inpatient Hospital Stay: Payer: BC Managed Care – PPO

## 2019-11-19 ENCOUNTER — Other Ambulatory Visit: Payer: Self-pay

## 2019-11-19 ENCOUNTER — Ambulatory Visit (HOSPITAL_COMMUNITY)
Admission: RE | Admit: 2019-11-19 | Discharge: 2019-11-19 | Disposition: A | Discharge status: Home / Self Care | Payer: BC Managed Care – PPO | Source: Ambulatory Visit | Attending: Oncology | Admitting: Oncology

## 2019-11-19 VITALS — BP 103/66 | HR 74 | Temp 97.1°F | Resp 16 | Ht 66.0 in | Wt 149.1 lb

## 2019-11-19 DIAGNOSIS — K7689 Other specified diseases of liver: Secondary | ICD-10-CM | POA: Diagnosis not present

## 2019-11-19 DIAGNOSIS — L989 Disorder of the skin and subcutaneous tissue, unspecified: Secondary | ICD-10-CM | POA: Diagnosis not present

## 2019-11-19 DIAGNOSIS — C787 Secondary malignant neoplasm of liver and intrahepatic bile duct: Secondary | ICD-10-CM | POA: Diagnosis not present

## 2019-11-19 DIAGNOSIS — Z5112 Encounter for antineoplastic immunotherapy: Secondary | ICD-10-CM | POA: Diagnosis not present

## 2019-11-19 DIAGNOSIS — C189 Malignant neoplasm of colon, unspecified: Secondary | ICD-10-CM | POA: Diagnosis not present

## 2019-11-19 DIAGNOSIS — C182 Malignant neoplasm of ascending colon: Secondary | ICD-10-CM

## 2019-11-19 DIAGNOSIS — K661 Hemoperitoneum: Secondary | ICD-10-CM | POA: Diagnosis not present

## 2019-11-19 DIAGNOSIS — Z5189 Encounter for other specified aftercare: Secondary | ICD-10-CM | POA: Diagnosis not present

## 2019-11-19 DIAGNOSIS — G620 Drug-induced polyneuropathy: Secondary | ICD-10-CM | POA: Diagnosis not present

## 2019-11-19 DIAGNOSIS — Z5111 Encounter for antineoplastic chemotherapy: Secondary | ICD-10-CM | POA: Diagnosis not present

## 2019-11-19 LAB — CBC WITH DIFFERENTIAL (CANCER CENTER ONLY)
Abs Immature Granulocytes: 0.05 10*3/uL (ref 0.00–0.07)
Basophils Absolute: 0 10*3/uL (ref 0.0–0.1)
Basophils Relative: 0 %
Eosinophils Absolute: 0.1 10*3/uL (ref 0.0–0.5)
Eosinophils Relative: 2 %
HCT: 35.7 % — ABNORMAL LOW (ref 36.0–46.0)
Hemoglobin: 11.3 g/dL — ABNORMAL LOW (ref 12.0–15.0)
Immature Granulocytes: 1 %
Lymphocytes Relative: 16 %
Lymphs Abs: 0.8 10*3/uL (ref 0.7–4.0)
MCH: 25.1 pg — ABNORMAL LOW (ref 26.0–34.0)
MCHC: 31.7 g/dL (ref 30.0–36.0)
MCV: 79.2 fL — ABNORMAL LOW (ref 80.0–100.0)
Monocytes Absolute: 0.3 10*3/uL (ref 0.1–1.0)
Monocytes Relative: 5 %
Neutro Abs: 4 10*3/uL (ref 1.7–7.7)
Neutrophils Relative %: 76 %
Platelet Count: 116 10*3/uL — ABNORMAL LOW (ref 150–400)
RBC: 4.51 MIL/uL (ref 3.87–5.11)
RDW: 15.6 % — ABNORMAL HIGH (ref 11.5–15.5)
WBC Count: 5.3 10*3/uL (ref 4.0–10.5)
nRBC: 0 % (ref 0.0–0.2)

## 2019-11-19 LAB — CMP (CANCER CENTER ONLY)
ALT: 18 U/L (ref 0–44)
AST: 18 U/L (ref 15–41)
Albumin: 3.8 g/dL (ref 3.5–5.0)
Alkaline Phosphatase: 133 U/L — ABNORMAL HIGH (ref 38–126)
Anion gap: 2 — ABNORMAL LOW (ref 5–15)
BUN: 7 mg/dL (ref 6–20)
CO2: 30 mmol/L (ref 22–32)
Calcium: 9.7 mg/dL (ref 8.9–10.3)
Chloride: 105 mmol/L (ref 98–111)
Creatinine: 0.63 mg/dL (ref 0.44–1.00)
GFR, Estimated: >60 mL/min (ref >60)
Glucose, Bld: 122 mg/dL — ABNORMAL HIGH (ref 70–99)
Potassium: 4.2 mmol/L (ref 3.5–5.1)
Sodium: 137 mmol/L (ref 135–145)
Total Bilirubin: 0.6 mg/dL (ref 0.3–1.2)
Total Protein: 6.8 g/dL (ref 6.5–8.1)

## 2019-11-19 LAB — CEA (IN HOUSE-CHCC): CEA (CHCC-In House): 1.9 ng/mL (ref 0.00–5.00)

## 2019-11-19 LAB — MAGNESIUM: Magnesium: 1.7 mg/dL (ref 1.7–2.4)

## 2019-11-19 MED ORDER — IOHEXOL 300 MG/ML  SOLN
100.0000 mL | Freq: Once | INTRAMUSCULAR | Status: AC | PRN
  Administered 2019-11-19: 100 mL via INTRAVENOUS

## 2019-11-19 MED ORDER — ATROPINE SULFATE 1 MG/ML IJ SOLN
0.5000 mg | Freq: Once | INTRAMUSCULAR | Status: AC | PRN
  Administered 2019-11-19: 0.5 mg via INTRAVENOUS

## 2019-11-19 MED ORDER — SODIUM CHLORIDE 0.9% FLUSH
10.0000 mL | INTRAVENOUS | Status: DC | PRN
  Administered 2019-11-19: 10 mL
  Filled 2019-11-19: qty 10

## 2019-11-19 MED ORDER — SODIUM CHLORIDE 0.9 % IV SOLN
6.0000 mg/kg | Freq: Once | INTRAVENOUS | Status: AC
  Administered 2019-11-19: 400 mg via INTRAVENOUS
  Filled 2019-11-19: qty 20

## 2019-11-19 MED ORDER — HEPARIN SOD (PORK) LOCK FLUSH 100 UNIT/ML IV SOLN
500.0000 [IU] | Freq: Once | INTRAVENOUS | Status: AC | PRN
  Administered 2019-11-19: 500 [IU]
  Filled 2019-11-19: qty 5

## 2019-11-19 MED ORDER — PALONOSETRON HCL INJECTION 0.25 MG/5ML
INTRAVENOUS | Status: AC
  Filled 2019-11-19: qty 5

## 2019-11-19 MED ORDER — SODIUM CHLORIDE 0.9 % IV SOLN
10.0000 mg | Freq: Once | INTRAVENOUS | Status: AC
  Administered 2019-11-19: 10 mg via INTRAVENOUS
  Filled 2019-11-19: qty 10

## 2019-11-19 MED ORDER — PALONOSETRON HCL INJECTION 0.25 MG/5ML
0.2500 mg | Freq: Once | INTRAVENOUS | Status: AC
  Administered 2019-11-19: 0.25 mg via INTRAVENOUS

## 2019-11-19 MED ORDER — ATROPINE SULFATE 1 MG/ML IJ SOLN
INTRAMUSCULAR | Status: AC
  Filled 2019-11-19: qty 1

## 2019-11-19 MED ORDER — SODIUM CHLORIDE 0.9 % IV SOLN
140.0000 mg/m2 | Freq: Once | INTRAVENOUS | Status: AC
  Administered 2019-11-19: 260 mg via INTRAVENOUS
  Filled 2019-11-19: qty 13

## 2019-11-19 MED ORDER — SODIUM CHLORIDE 0.9 % IV SOLN
Freq: Once | INTRAVENOUS | Status: AC
  Filled 2019-11-19: qty 250

## 2019-11-19 NOTE — Patient Instructions (Signed)
[facility] Discharge Instructions for Patients Receiving Chemotherapy  Today you received the following chemotherapy agents; vectibix, irinotecan.  To help prevent nausea and vomiting after your treatment, we encourage you to take your nausea medication as prescribed by your physician.    If you develop nausea and vomiting that is not controlled by your nausea medication, call the clinic.   BELOW ARE SYMPTOMS THAT SHOULD BE REPORTED IMMEDIATELY:  *FEVER GREATER THAN 100.5 F  *CHILLS WITH OR WITHOUT FEVER  NAUSEA AND VOMITING THAT IS NOT CONTROLLED WITH YOUR NAUSEA MEDICATION  *UNUSUAL SHORTNESS OF BREATH  *UNUSUAL BRUISING OR BLEEDING  TENDERNESS IN MOUTH AND THROAT WITH OR WITHOUT PRESENCE OF ULCERS  *URINARY PROBLEMS  *BOWEL PROBLEMS  UNUSUAL RASH Items with * indicate a potential emergency and should be followed up as soon as possible.  Feel free to call the clinic should you have any questions or concerns. The clinic phone number is [phone].  Please show the [place] [place] at check-in to the Emergency Department and triage nurse.

## 2019-11-19 NOTE — Progress Notes (Signed)
Patient requested that port needle be left in from CT scan this morning. Dressing changed for patient comfort. Labs drawn with no issues noted.

## 2019-11-19 NOTE — Progress Notes (Addendum)
[facility] OFFICE PROGRESS NOTE   Diagnosis: Colon cancer  INTERVAL HISTORY:   Ms. Knieriem returns as scheduled.  She completed a cycle of irinotecan/Panitumumab 11/05/2019.  She had mild nausea.  No mouth sores.  No change in baseline bowel habits, loose stools, Lomotil effective.  She notes the rash has increased over her face and arms. Objective:  Vital signs in last 24 hours:  Blood pressure 103/66, pulse 74, temperature (!) 97.1 F (36.2 C), temperature source Tympanic, resp. rate 16, height 5' 6" (1.676 m), weight 149 lb 1.6 oz (67.6 kg), SpO2 98 %.    HEENT: No thrush or ulcers. Resp: Lungs clear bilaterally. Cardio: Regular rate and rhythm. GI: Abdomen soft and nontender.  No hepatomegaly.  Opening at left mid abdominal wall is covered with a colostomy wafer/bag. Vascular: No leg edema.  Skin: Mild rash over the face, arms, chest, back.  Paronychia at the great toe bilaterally. Port-A-Cath without erythema.   Lab Results:  Lab Results  Component Value Date   WBC 5.3 11/19/2019   HGB 11.3 (L) 11/19/2019   HCT 35.7 (L) 11/19/2019   MCV 79.2 (L) 11/19/2019   PLT 116 (L) 11/19/2019   NEUTROABS 4.0 11/19/2019    Imaging:  CT ABDOMEN PELVIS W CONTRAST  Result Date: 11/19/2019 CLINICAL DATA:  Colon cancer. Restaging. History of partial colon resection, hysterectomy, and appendectomy. EXAM: CT ABDOMEN AND PELVIS WITH CONTRAST TECHNIQUE: Multidetector CT imaging of the abdomen and pelvis was performed using the standard protocol following bolus administration of intravenous contrast. CONTRAST:  100mL OMNIPAQUE IOHEXOL 300 MG/ML  SOLN COMPARISON:  Imaging from [facility] dated 08/19/2019. [place] CT from 02/27/2019. FINDINGS: Lower chest: Unremarkable. Hepatobiliary: Focal calcified lesion in the anterior left hepatic dome is stable in the interval at 1.1 x 1.4 cm. A second tiny 4 mm low-density lesion in the dome of the lateral  segment left liver is stable since 08/19/2019. Both of these lesions are markedly decreased since 02/27/2019. No new focal abnormality within the hepatic parenchyma. There is no evidence for gallstones, gallbladder wall thickening, or pericholecystic fluid. No intrahepatic or extrahepatic biliary dilation. Pancreas: No focal mass lesion. No dilatation of the main duct. No intraparenchymal cyst. No peripancreatic edema. Spleen: Spleen measures 14.3 cm craniocaudal length, upper normal. Adrenals/Urinary Tract: No adrenal nodule or mass. Right kidney and ureter unremarkable. Mild fullness of the left intrarenal collecting system and renal pelvis. Left ureter tracks down through soft tissue attenuation in the retroperitoneal space of the posterior left lower abdomen and pelvis. Bladder is nondistended. Stomach/Bowel: Stomach is unremarkable. No gastric wall thickening. No evidence of outlet obstruction. Duodenum is normally positioned as is the ligament of Treitz. No small bowel wall thickening. No small bowel dilatation. Status post right hemicolectomy. Sequelae of colostomy takedown evident with persistent overlying skin thickening and soft tissue attenuation tracking from the skin into the anterior abdominal wall musculature. 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 Patient is status post low anterior resection. Vascular/Lymphatic: No abdominal aortic aneurysm. No abdominal aortic atherosclerotic calcification. There is no gastrohepatic or hepatoduodenal ligament lymphadenopathy. No retroperitoneal or mesenteric lymphadenopathy. No pelvic sidewall lymphadenopathy. Reproductive: The uterus is surgically absent. There is no adnexal mass. Similar appearance of mild vaginal wall thickening. Other: Stable appearance presacral soft tissue attenuation compatible with postsurgical scarring. Peritoneal thickening, left greater  than right posteriorly is also   stable. No intraperitoneal free fluid. Musculoskeletal: No worrisome lytic or sclerotic osseous abnormality. IMPRESSION: 1. Stable exam. No new or progressive interval findings. 2. Stable appearance of the calcified lesion in the anterior left hepatic dome with second tiny low-density lesion in the dome of the lateral segment left liver. Both of these lesions are markedly decreased since 02/27/2019. 3. Stable mild fullness of the left intrarenal collecting system and renal pelvis. Left ureter tracks down through stable soft tissue attenuation in the posterior left lower abdomen and pelvis. 4. Similar appearance of abnormal soft tissue in the left paramidline subcutaneous fat with associated skin thickening. This likely correlates to the patient's history of fistula. Electronically Signed   By: Eric  Mansell M.D.   On: 11/19/2019 09:45    Medications: I have reviewed the patient's current medications.  Assessment/Plan: 1. 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  The tumor returned microsatellite stable with no loss of mismatch repair protein expression   APC mutated. No BRAF, KRAS, or NRAS mutation On Foundation 1 testing   Cycle 1 adjuvant FOLFOX 09/08/2013   Cycle 2 adjuvant FOLFOX 09/24/2013   Cycle 3 adjuvant FOLFOX 10/08/2013.   Cycle 4 adjuvant FOLFOX 10/22/2013.   Cycle 5 adjuvant FOLFOX 11/05/2013. Oxaliplatin held due to thrombocytopenia.  Cycle 6 FOLFOX 11/19/2013.  Cycle 7 FOLFOX 12/03/2013. Oxaliplatin held secondary to thrombocytopenia.  Cycle 8 FOLFOX 12/17/2013.  Cycle 9 FOLFOX 01/04/2014. Oxaliplatin held secondary to neutropenia.   Cycle 10 FOLFOX 01/21/2014. Oxaliplatin held secondary to thrombocytopenia.  Cycle 11 FOLFOX 02/04/2014  Cycle 12 FOLFOX 02/18/2014, oxaliplatin dose reduced secondary tothrombocytopenia  CT  abdomen/pelvis 01/30/2014 revealed splenomegaly and no evidence of recurrent colon cancer  CT chest 04/07/2014 with a stable right lower lobe nodule and no evidence for metastatic disease, no nodules seen on the CT 11/26/2014  Markedly elevated CEA 11/24/2014  CT 11/26/2014 revealed a right pelvic mass, splenomegaly, small volume ascites  Right salpingo-oophorectomy 12/28/2014 with the pathology confirming metastatic colon cancer  CTs 03/23/2016-no evidence of recurrent or metastatic disease.  CT 11/26/2016-enlargement of a fluid density structure the right pelvic sidewall, no other evidence of metastatic disease  CT aspiration right pelvic cyst 12/19/2016.Cytology-BENIGN REACTIVE/REPARATIVE CHANGES.  CTs 06/05/2017-no evidence of metastatic disease, mild cirrhotic changes with splenomegaly  CTs 12/11/2017-recurrent cystic right adnexal mass, similar to on the CT 11/26/2016, stable mild splenomegaly  CTs 04/23/2018-enlargement of cystic right adnexal mass with mural nodularity, no other evidence of metastatic disease  Cytoreductive surgery/HIPEC with mitomycin by Dr. Levine at [facility] 08/12/2018-R1 resection achieved. Cytoreduction included omentectomy, LAR, right salpingo-oophorectomy and left colonic gutter/pelvic stripping. Pathology on the rectum showed recurrent/metastatic adenocarcinoma, tumor 2.0 cm, predominantly involving the subserosa and muscularis propria of the colon, proximal and distal margins of resection were negative, vascular invasion present, metastatic carcinoma present in 1 out of 5 lymph nodes; omentum resection with no malignancy seen, no metastatic carcinoma identified in 1 lymph node examined; left gutter stripping positive metastatic adenocarcinoma; right ovary resection positive metastatic adenocarcinoma.  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  CEA 68 02/03/2019  Biopsy liver lesion  02/11/2019-metastatic adenocarcinoma consistent with primary colonic adenocarcinoma  CTs 02/27/2019-multiple liver lesions increased in size, new lesion in the lateral right lobe of the liver. No evidence of metastatic disease in the chest. Redemonstrated moderate left hydronephrosis and proximal hydroureter without discrete lesion or obstructing etiology at the transition point of the mid   ureter.  Cycle 1 FOLFIRI/Panitumumab 03/12/2019  Cycle 2 FOLFIRI/Panitumumab 03/26/2019-bolus 5-FU and irinotecan held secondary to neutropenia  Cycle 3 FOLFIRI/Panitumumab 04/09/2019, Udenyca added-not given secondary to seizure/discontinuation of the 5-FU pump  CT abdomen/pelvis at [facility] 05/04/2019-no residual fluid collection at the left abdominal wall abscess, stable moderate left hydronephrosis, multifocal indeterminate liver lesions--liver lesionssignificantly improved  Cycle 4 FOLFIRI/Panitumumab 05/07/2019, Udenyca  Cycle 5 FOLFIRI/Panitumumab 05/20/2019, Udenyca  Cycle 6 FOLFIRI/Panitumumab 06/18/2019, Udenyca  Cycle 7 FOLFIRI/Panitumumab 07/01/2019 (Irinotecan dose reduced due to thrombocytopenia), Udenyca--Udenyca was not given  Cycle 8 FOLFIRI/Panitumumab 07/15/2019, Udenyca  Cycle 9 FOLFIRI/Panitumumab 07/29/2019, Udenyca  Cycle 10 FOLFIRI/Panitumumab 08/13/2019, Udenyca  CTs at [facility] 08/19/2019-decreased size of the hypodense and hyperdense lesions within segment 2 of the left hepatic lobe. No new lesions identified. Similar presacral soft tissue thickening with adjacent stable alignment in the rectum. Improved but persistent left UPJ obstruction with mild left hydronephrosis. Persistent but decreased size of the wound within the left mid abdomen abdominal wall.  Cycle 11 irinotecan/Panitumumab 08/27/2019, Udenyca  Cycle 12 irinotecan/Panitumumab 10/08/2019, Udenyca  Cycle 13 irinotecan/Panitumumab 11/05/2019, Udenyca  CT abdomen/pelvis 11/19/2019-no new or progressive interval findings.   Stable appearance of the calcified lesion in the anterior left hepatic dome with second tiny low-density lesion in the dome of the lateral segment left liver.  Both lesions are markedly decreased since 02/27/2019.  Stable mild fullness left intrarenal collecting system and renal pelvis.  Similar appearance of abnormal soft tissue in the left paramidline subcutaneous fat with associated skin thickening, this likely correlates to the fistula.  Cycle 14 irinotecan/Panitumumab 11/19/2019, Udenyca 2. Mild elevation of the CEA beginning January 2016, normal on 05/19/2014   3. History of iron deficiency anemia  4. seizure disorder; seizure 04/09/2019. Brain CT negative. Now on Keppra. 5. history of depression  6. 4 mm right lower lobe nodule on a staging chest CT 09/08/2013 , stable on a CT 04/07/2014 7. Hospitalization 09/24/2013 through 09/26/2013 with fever and abdominal pain.  8. 09/24/2013 urine culture positive for coag negative staph.  9. 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 Splenomegaly noted on a CT scan 01/30/2014,persistent on repeat CTs 12. Colonoscopy 11/17/2018-flexible sigmoidoscopy per rectum with changes of mild diversion colitis. Scope advanced for approximately 25 cm. Most proximal portion had necrotic appearing debris. Colostomy bag insufflated suggesting some type of communication between the pouch and the proximal colon. Introduction of scope into the ostomy found to available directions. 1 toward the distal pouch with similar appearing necrotic debris encountered. The other was about a 30 cm segment of normal-appearing colonic mucosa to the level of the previous right hemicolectomy ileocolonic anastomosis. 13. Port-A-Cath placement interventional radiology 03/05/2019 14. Left leg/foot weakness-brain MRI 09/03/2019 with no evidence of metastatic disease, referred to physical  therapy 15. 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 COVID-19 + 09/10/2019 17. Hospitalized with seizure 09/17/2019 through 09/19/2019-MRI brain without evidence of metastatic disease. Seen by neurology. Keppra dose increased.  Disposition: Ms. Frizell appears stable.  She has completed 13 cycles of irinotecan/Panitumumab.  Restaging CTs from earlier today show stable disease, nothing new or progressive.  Dr. Sherrill recommends continuation of irinotecan/Panitumumab every 2   weeks.  She agrees with this plan, cycle 14 today.  We reviewed the CBC from today.  Counts adequate to proceed with treatment.  She will return for lab, follow-up, irinotecan/Panitumumab in 2 weeks.  She will contact the office in the interim with any problems.  Patient seen with Dr. Benay Spice.  CT images reviewed on the computer with Ms. Owens Shark.    Ned Card ANP/GNP-BC   11/19/2019  11:00 AM  This was a shared visit with Ned Card.  We reviewed the restaging CT images with Ms. Owens Shark.  There is no evidence of disease progression.  The plan is to continue irinotecan/Panitumumab.  We recommended she follow-up with Dr. Clovis Riley to discuss management of the persistent abdominal fistula.  Julieanne Manson, MD

## 2019-11-21 ENCOUNTER — Other Ambulatory Visit: Payer: Self-pay

## 2019-11-21 ENCOUNTER — Inpatient Hospital Stay: Payer: BC Managed Care – PPO

## 2019-11-21 VITALS — BP 114/79 | HR 90 | Temp 97.9°F | Resp 20

## 2019-11-21 DIAGNOSIS — C182 Malignant neoplasm of ascending colon: Secondary | ICD-10-CM

## 2019-11-21 DIAGNOSIS — Z5112 Encounter for antineoplastic immunotherapy: Secondary | ICD-10-CM | POA: Diagnosis not present

## 2019-11-21 DIAGNOSIS — Z5189 Encounter for other specified aftercare: Secondary | ICD-10-CM | POA: Diagnosis not present

## 2019-11-21 DIAGNOSIS — C787 Secondary malignant neoplasm of liver and intrahepatic bile duct: Secondary | ICD-10-CM | POA: Diagnosis not present

## 2019-11-21 DIAGNOSIS — Z5111 Encounter for antineoplastic chemotherapy: Secondary | ICD-10-CM | POA: Diagnosis not present

## 2019-11-21 DIAGNOSIS — G620 Drug-induced polyneuropathy: Secondary | ICD-10-CM | POA: Diagnosis not present

## 2019-11-21 MED ORDER — PEGFILGRASTIM-CBQV 6 MG/0.6ML ~~LOC~~ SOSY
6.0000 mg | PREFILLED_SYRINGE | Freq: Once | SUBCUTANEOUS | Status: AC
  Administered 2019-11-21: 6 mg via SUBCUTANEOUS

## 2019-11-21 NOTE — Patient Instructions (Signed)

## 2019-11-27 DIAGNOSIS — L988 Other specified disorders of the skin and subcutaneous tissue: Secondary | ICD-10-CM | POA: Diagnosis not present

## 2019-11-27 DIAGNOSIS — C189 Malignant neoplasm of colon, unspecified: Secondary | ICD-10-CM | POA: Diagnosis not present

## 2019-11-27 DIAGNOSIS — K632 Fistula of intestine: Secondary | ICD-10-CM | POA: Diagnosis not present

## 2019-11-27 DIAGNOSIS — C182 Malignant neoplasm of ascending colon: Secondary | ICD-10-CM | POA: Diagnosis not present

## 2019-11-29 ENCOUNTER — Other Ambulatory Visit: Payer: Self-pay | Admitting: Oncology

## 2019-12-02 ENCOUNTER — Inpatient Hospital Stay: Payer: BC Managed Care – PPO

## 2019-12-02 ENCOUNTER — Other Ambulatory Visit: Payer: BC Managed Care – PPO

## 2019-12-02 ENCOUNTER — Other Ambulatory Visit: Payer: Self-pay

## 2019-12-02 ENCOUNTER — Inpatient Hospital Stay (HOSPITAL_BASED_OUTPATIENT_CLINIC_OR_DEPARTMENT_OTHER): Payer: BC Managed Care – PPO | Admitting: Oncology

## 2019-12-02 VITALS — BP 130/65 | HR 81 | Temp 97.2°F | Resp 17 | Ht 66.0 in | Wt 150.0 lb

## 2019-12-02 DIAGNOSIS — C182 Malignant neoplasm of ascending colon: Secondary | ICD-10-CM

## 2019-12-02 DIAGNOSIS — C787 Secondary malignant neoplasm of liver and intrahepatic bile duct: Secondary | ICD-10-CM | POA: Diagnosis not present

## 2019-12-02 DIAGNOSIS — Z5112 Encounter for antineoplastic immunotherapy: Secondary | ICD-10-CM | POA: Diagnosis not present

## 2019-12-02 DIAGNOSIS — Z5111 Encounter for antineoplastic chemotherapy: Secondary | ICD-10-CM | POA: Diagnosis not present

## 2019-12-02 DIAGNOSIS — Z5189 Encounter for other specified aftercare: Secondary | ICD-10-CM | POA: Diagnosis not present

## 2019-12-02 DIAGNOSIS — G620 Drug-induced polyneuropathy: Secondary | ICD-10-CM | POA: Diagnosis not present

## 2019-12-02 DIAGNOSIS — Z95828 Presence of other vascular implants and grafts: Secondary | ICD-10-CM

## 2019-12-02 LAB — CBC WITH DIFFERENTIAL (CANCER CENTER ONLY)
Abs Immature Granulocytes: 0.1 10*3/uL — ABNORMAL HIGH (ref 0.00–0.07)
Basophils Absolute: 0 10*3/uL (ref 0.0–0.1)
Basophils Relative: 1 %
Eosinophils Absolute: 0.1 10*3/uL (ref 0.0–0.5)
Eosinophils Relative: 2 %
HCT: 36.5 % (ref 36.0–46.0)
Hemoglobin: 11.4 g/dL — ABNORMAL LOW (ref 12.0–15.0)
Immature Granulocytes: 1 %
Lymphocytes Relative: 15 %
Lymphs Abs: 1.1 10*3/uL (ref 0.7–4.0)
MCH: 24.7 pg — ABNORMAL LOW (ref 26.0–34.0)
MCHC: 31.2 g/dL (ref 30.0–36.0)
MCV: 79.2 fL — ABNORMAL LOW (ref 80.0–100.0)
Monocytes Absolute: 0.3 10*3/uL (ref 0.1–1.0)
Monocytes Relative: 5 %
Neutro Abs: 5.7 10*3/uL (ref 1.7–7.7)
Neutrophils Relative %: 76 %
Platelet Count: 146 10*3/uL — ABNORMAL LOW (ref 150–400)
RBC: 4.61 MIL/uL (ref 3.87–5.11)
RDW: 16.2 % — ABNORMAL HIGH (ref 11.5–15.5)
WBC Count: 7.4 10*3/uL (ref 4.0–10.5)
nRBC: 0 % (ref 0.0–0.2)

## 2019-12-02 LAB — CMP (CANCER CENTER ONLY)
ALT: 23 U/L (ref 0–44)
AST: 20 U/L (ref 15–41)
Albumin: 3.9 g/dL (ref 3.5–5.0)
Alkaline Phosphatase: 155 U/L — ABNORMAL HIGH (ref 38–126)
Anion gap: 11 (ref 5–15)
BUN: 6 mg/dL (ref 6–20)
CO2: 26 mmol/L (ref 22–32)
Calcium: 9.5 mg/dL (ref 8.9–10.3)
Chloride: 107 mmol/L (ref 98–111)
Creatinine: 0.71 mg/dL (ref 0.44–1.00)
GFR, Estimated: >60 mL/min (ref >60)
Glucose, Bld: 152 mg/dL — ABNORMAL HIGH (ref 70–99)
Potassium: 3.8 mmol/L (ref 3.5–5.1)
Sodium: 144 mmol/L (ref 135–145)
Total Bilirubin: 0.5 mg/dL (ref 0.3–1.2)
Total Protein: 6.8 g/dL (ref 6.5–8.1)

## 2019-12-02 LAB — MAGNESIUM: Magnesium: 1.4 mg/dL — ABNORMAL LOW (ref 1.7–2.4)

## 2019-12-02 LAB — CEA (IN HOUSE-CHCC): CEA (CHCC-In House): 2.26 ng/mL (ref 0.00–5.00)

## 2019-12-02 MED ORDER — PALONOSETRON HCL INJECTION 0.25 MG/5ML
INTRAVENOUS | Status: AC
  Filled 2019-12-02: qty 5

## 2019-12-02 MED ORDER — SODIUM CHLORIDE 0.9% FLUSH
10.0000 mL | Freq: Once | INTRAVENOUS | Status: DC
  Filled 2019-12-02: qty 10

## 2019-12-02 MED ORDER — ATROPINE SULFATE 1 MG/ML IJ SOLN
0.5000 mg | Freq: Once | INTRAMUSCULAR | Status: AC | PRN
  Administered 2019-12-02: 0.5 mg via INTRAVENOUS

## 2019-12-02 MED ORDER — HEPARIN SOD (PORK) LOCK FLUSH 100 UNIT/ML IV SOLN
500.0000 [IU] | Freq: Once | INTRAVENOUS | Status: AC | PRN
  Administered 2019-12-02: 500 [IU]
  Filled 2019-12-02: qty 5

## 2019-12-02 MED ORDER — PALONOSETRON HCL INJECTION 0.25 MG/5ML
0.2500 mg | Freq: Once | INTRAVENOUS | Status: AC
  Administered 2019-12-02: 0.25 mg via INTRAVENOUS

## 2019-12-02 MED ORDER — ATROPINE SULFATE 1 MG/ML IJ SOLN
INTRAMUSCULAR | Status: AC
  Filled 2019-12-02: qty 1

## 2019-12-02 MED ORDER — MAGNESIUM SULFATE 4 GM/100ML IV SOLN
4.0000 g | Freq: Once | INTRAVENOUS | Status: AC
  Administered 2019-12-02: 4 g via INTRAVENOUS
  Filled 2019-12-02: qty 100

## 2019-12-02 MED ORDER — SODIUM CHLORIDE 0.9% FLUSH
10.0000 mL | INTRAVENOUS | Status: DC | PRN
  Administered 2019-12-02: 10 mL
  Filled 2019-12-02: qty 10

## 2019-12-02 MED ORDER — SODIUM CHLORIDE 0.9 % IV SOLN
6.0000 mg/kg | Freq: Once | INTRAVENOUS | Status: AC
  Administered 2019-12-02: 400 mg via INTRAVENOUS
  Filled 2019-12-02: qty 20

## 2019-12-02 MED ORDER — SODIUM CHLORIDE 0.9 % IV SOLN
Freq: Once | INTRAVENOUS | Status: AC
  Filled 2019-12-02: qty 250

## 2019-12-02 MED ORDER — SODIUM CHLORIDE 0.9 % IV SOLN
10.0000 mg | Freq: Once | INTRAVENOUS | Status: AC
  Administered 2019-12-02: 10 mg via INTRAVENOUS
  Filled 2019-12-02: qty 10

## 2019-12-02 MED ORDER — SODIUM CHLORIDE 0.9 % IV SOLN
140.0000 mg/m2 | Freq: Once | INTRAVENOUS | Status: AC
  Administered 2019-12-02: 260 mg via INTRAVENOUS
  Filled 2019-12-02: qty 13

## 2019-12-02 NOTE — Progress Notes (Signed)
[facility] OFFICE PROGRESS NOTE   Diagnosis: Colon cancer  INTERVAL HISTORY:   [name] completed another cycle of irinotecan/Panitumumab on 11/19/2019.  She has noted a progressive pruritic rash over the chest.  She has areas of paronychia at the hands and feet.  Benadryl helps the pruritus.  She is not taking doxycycline.  No diarrhea.  She continues to have drainage at the left abdominal fistula.  She saw Dr. Clovis Riley last week.  He plans to perform fistula repair surgery on 01/07/2020.  He requests a 1 month break from systemic therapy prior to surgery. She is tapering off of Keppra and starting lacosamide.  No further seizures.  Objective:  Vital signs in last 24 hours:  Blood pressure 130/65, pulse 81, temperature (!) 97.2 F (36.2 C), temperature source Tympanic, resp. rate 17, height $RemoveBe'5\' 6"'YvCIqAnbw$  (1.676 m), weight 150 lb (68 kg), SpO2 100 %.    HEENT: No thrush or ulcers Resp: Lungs clear bilaterally Cardio: Regular rate and rhythm GI: No hepatosplenomegaly, left abdomen fistula site covered with an ostomy bag Vascular: No leg edema  Skin: Acne type rash over the trunk, few areas of linear ulceration at a distal finger, paronychia at the great toe bilaterally  Portacath/PICC-without erythema  Lab Results:  Lab Results  Component Value Date   WBC 7.4 12/02/2019   HGB 11.4 (L) 12/02/2019   HCT 36.5 12/02/2019   MCV 79.2 (L) 12/02/2019   PLT 146 (L) 12/02/2019   NEUTROABS 5.7 12/02/2019    CMP  Lab Results  Component Value Date   NA 144 12/02/2019   K 3.8 12/02/2019   CL 107 12/02/2019   CO2 26 12/02/2019   GLUCOSE 152 (H) 12/02/2019   BUN 6 12/02/2019   CREATININE 0.71 12/02/2019   CALCIUM 9.5 12/02/2019   PROT 6.8 12/02/2019   ALBUMIN 3.9 12/02/2019   AST 20 12/02/2019   ALT 23 12/02/2019   ALKPHOS 155 (H) 12/02/2019   BILITOT 0.5 12/02/2019   GFRNONAA >60 12/02/2019   GFRAA >60 11/05/2019    Lab Results  Component Value Date   CEA1 2.26  12/02/2019    Medications: I have reviewed the patient's current medications.   Assessment/Plan: 1. 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  The tumor returned microsatellite stable with no loss of mismatch repair protein expression   APC mutated. No BRAF, KRAS, or NRAS mutation On Foundation 1 testing   Cycle 1 adjuvant FOLFOX 09/08/2013   Cycle 2 adjuvant FOLFOX 09/24/2013   Cycle 3 adjuvant FOLFOX 10/08/2013.   Cycle 4 adjuvant FOLFOX 10/22/2013.   Cycle 5 adjuvant FOLFOX 11/05/2013. Oxaliplatin held due to thrombocytopenia.  Cycle 6 FOLFOX 11/19/2013.  Cycle 7 FOLFOX 12/03/2013. Oxaliplatin held secondary to thrombocytopenia.  Cycle 8 FOLFOX 12/17/2013.  Cycle 9 FOLFOX 01/04/2014. Oxaliplatin held secondary to neutropenia.   Cycle 10 FOLFOX 01/21/2014. Oxaliplatin held secondary to thrombocytopenia.  Cycle 11 FOLFOX 02/04/2014  Cycle 12 FOLFOX 02/18/2014, oxaliplatin dose reduced secondary tothrombocytopenia  CT abdomen/pelvis 01/30/2014 revealed splenomegaly and no evidence of recurrent colon cancer  CT chest 04/07/2014 with a stable right lower lobe nodule and no evidence for metastatic disease, no nodules seen on the CT 11/26/2014  Markedly elevated CEA 11/24/2014  CT 11/26/2014 revealed a right pelvic mass, splenomegaly, small volume ascites  Right salpingo-oophorectomy 12/28/2014 with the pathology confirming metastatic colon cancer  CTs 03/23/2016-no evidence of recurrent or metastatic disease.  CT 11/26/2016-enlargement  of a fluid density structure the right pelvic sidewall, no other evidence of metastatic disease  CT aspiration right pelvic cyst 12/19/2016.Cytology-BENIGN REACTIVE/REPARATIVE CHANGES.  CTs 06/05/2017-no evidence of metastatic disease, mild cirrhotic changes with splenomegaly  CTs 12/11/2017-recurrent cystic right adnexal  mass, similar to on the CT 11/26/2016, stable mild splenomegaly  CTs 04/23/2018-enlargement of cystic right adnexal mass with mural nodularity, no other evidence of metastatic disease  Cytoreductive surgery/HIPEC with mitomycin by Dr. Clovis Riley at [facility] 08/12/2018-R1 resection achieved. Cytoreduction included omentectomy, LAR, right salpingo-oophorectomy and left colonic gutter/pelvic stripping. Pathology on the rectum showed recurrent/metastatic adenocarcinoma, tumor 2.0 cm, predominantly involving the subserosa and muscularis propria of the colon, proximal and distal margins of resection were negative, vascular invasion present, metastatic carcinoma present in 1 out of 5 lymph nodes; omentum resection with no malignancy seen, no metastatic carcinoma identified in 1 lymph node examined; left gutter stripping positive metastatic adenocarcinoma; right ovary resection positive metastatic adenocarcinoma.  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  CEA 68 02/03/2019  Biopsy liver lesion 02/11/2019-metastatic adenocarcinoma consistent with primary colonic adenocarcinoma  CTs 02/27/2019-multiple liver lesions increased in size, new lesion in the lateral right lobe of the liver. No evidence of metastatic disease in the chest. Redemonstrated moderate left hydronephrosis and proximal hydroureter without discrete lesion or obstructing etiology at the transition point of the mid ureter.  Cycle 1 FOLFIRI/Panitumumab 03/12/2019  Cycle 2 FOLFIRI/Panitumumab 03/26/2019-bolus 5-FU and irinotecan held secondary to neutropenia  Cycle 3 FOLFIRI/Panitumumab 04/09/2019, Udenyca added-not given secondary to seizure/discontinuation of the 5-FU pump  CT abdomen/pelvis at [facility] 05/04/2019-no residual fluid collection at the left abdominal wall abscess, stable moderate left hydronephrosis, multifocal indeterminate liver lesions--liver  lesionssignificantly improved  Cycle 4 FOLFIRI/Panitumumab 05/07/2019, Udenyca  Cycle 5 FOLFIRI/Panitumumab 05/20/2019, Udenyca  Cycle 6 FOLFIRI/Panitumumab 06/18/2019, Udenyca  Cycle 7 FOLFIRI/Panitumumab 07/01/2019 (Irinotecan dose reduced due to thrombocytopenia), Udenyca--Udenyca was not given  Cycle 8 FOLFIRI/Panitumumab 07/15/2019, Udenyca  Cycle 9 FOLFIRI/Panitumumab 07/29/2019, Udenyca  Cycle 10 FOLFIRI/Panitumumab 08/13/2019, Udenyca  CTs at [facility] 08/19/2019-decreased size of the hypodense and hyperdense lesions within segment 2 of the left hepatic lobe. No new lesions identified. Similar presacral soft tissue thickening with adjacent stable alignment in the rectum. Improved but persistent left UPJ obstruction with mild left hydronephrosis. Persistent but decreased size of the wound within the left mid abdomen abdominal wall.  Cycle 11 irinotecan/Panitumumab 08/27/2019, Udenyca  Cycle 12 irinotecan/Panitumumab 10/08/2019, Udenyca  Cycle 13 irinotecan/Panitumumab 11/05/2019, Udenyca  CT abdomen/pelvis 11/19/2019-no new or progressive interval findings.  Stable appearance of the calcified lesion in the anterior left hepatic dome with second tiny low-density lesion in the dome of the lateral segment left liver.  Both lesions are markedly decreased since 02/27/2019.  Stable mild fullness left intrarenal collecting system and renal pelvis.  Similar appearance of abnormal soft tissue in the left paramidline subcutaneous fat with associated skin thickening, this likely correlates to the fistula.  Cycle 14 irinotecan/Panitumumab 11/19/2019, Udenyca  Cycle 15 irinotecan/Panitumumab 12/02/2019, Udenyca 2. Mild elevation of the CEA beginning January 2016, normal on 05/19/2014   3. History of iron deficiency anemia  4. seizure disorder; seizure 04/09/2019. Brain CT negative. Now on Keppra. 5. history of depression  6. 4 mm right lower lobe nodule on a staging chest CT 09/08/2013 , stable  on a CT 04/07/2014 7. Hospitalization 09/24/2013 through 09/26/2013 with fever and abdominal pain.  8. 09/24/2013 urine culture positive for coag negative staph.  9. 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 Splenomegaly noted on a CT scan 01/30/2014,persistent on repeat CTs 12. Colonoscopy 11/17/2018-flexible sigmoidoscopy per rectum with changes of mild diversion colitis. Scope advanced for approximately 25 cm. Most proximal portion had necrotic appearing debris. Colostomy bag insufflated suggesting some type of communication between the pouch and the proximal colon. Introduction of scope into the ostomy found to available directions. 1 toward the distal pouch with similar appearing necrotic debris encountered. The other was about a 30 cm segment of normal-appearing colonic mucosa to the level of the previous right hemicolectomy ileocolonic anastomosis. 76. Port-A-Cath placement interventional radiology 03/05/2019 14. Left leg/foot weakness-brain MRI 09/03/2019 with no evidence of metastatic disease, referred to physical therapy 15. 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 COVID-19 + 09/10/2019 17. Hospitalized with seizure 09/17/2019 through 09/19/2019-MRI brain without evidence of metastatic disease. Seen by neurology. Keppra dose increased.    Disposition: [name] appears stable.  She will complete another cycle of irinotecan/Panitumumab today.  She will then have a treatment break until she undergoes fistula repair surgery on  01/07/2020.  She will continue moisturizers and Neosporin for the paronychia.  She will resume doxycycline if the skin rash progresses.  She will return for an office and lab visit on 12/24/2019.  Betsy Coder, MD  12/02/2019  1:51 PM

## 2019-12-02 NOTE — Patient Instructions (Addendum)
[facility] Discharge Instructions for Patients Receiving Chemotherapy  Today you received the following chemotherapy agents: Vectibix.  To help prevent nausea and vomiting after your treatment, we encourage you to take your nausea medication as directed.   If you develop nausea and vomiting that is not controlled by your nausea medication, call the clinic.   BELOW ARE SYMPTOMS THAT SHOULD BE REPORTED IMMEDIATELY:  *FEVER GREATER THAN 100.5 F  *CHILLS WITH OR WITHOUT FEVER  NAUSEA AND VOMITING THAT IS NOT CONTROLLED WITH YOUR NAUSEA MEDICATION  *UNUSUAL SHORTNESS OF BREATH  *UNUSUAL BRUISING OR BLEEDING  TENDERNESS IN MOUTH AND THROAT WITH OR WITHOUT PRESENCE OF ULCERS  *URINARY PROBLEMS  *BOWEL PROBLEMS  UNUSUAL RASH Items with * indicate a potential emergency and should be followed up as soon as possible.  Feel free to call the clinic should you have any questions or concerns. The clinic phone number is [phone].  Please show the [place] at check-in to the Emergency Department and triage nurse.  Hypomagnesemia Hypomagnesemia is a condition in which the level of magnesium in the blood is low. Magnesium is a mineral that is found in many foods. It is used in many different processes in the body. Hypomagnesemia can affect every organ in the body. In severe cases, it can cause life-threatening problems. What are the causes? This condition may be caused by:  Not getting enough magnesium in your diet.  Malnutrition.  Problems with absorbing magnesium from the intestines.  Dehydration.  Alcohol abuse.  Vomiting.  Severe or chronic diarrhea.  Some medicines, including medicines that make you urinate more (diuretics).  Certain diseases, such as kidney disease, diabetes, celiac disease, and overactive thyroid. What are the signs or symptoms? Symptoms of this condition include:  Loss of appetite.  Nausea and vomiting.  Involuntary  shaking or trembling of a body part (tremor).  Muscle weakness.  Tingling in the arms and legs.  Sudden tightening of muscles (muscle spasms).  Confusion.  Psychiatric issues, such as depression, irritability, or psychosis.  A feeling of fluttering of the heart.  Seizures. These symptoms are more severe if magnesium levels drop suddenly. How is this diagnosed? This condition may be diagnosed based on:  Your symptoms and medical history.  A physical exam.  Blood and urine tests. How is this treated? Treatment depends on the cause and the severity of the condition. It may be treated with:  A magnesium supplement. This can be taken in pill form. If the condition is severe, magnesium is usually given through an IV.  Changes to your diet. You may be directed to eat foods that have a lot of magnesium, such as green leafy vegetables, peas, beans, and nuts.  Stopping any intake of alcohol. Follow these instructions at home:      Make sure that your diet includes foods with magnesium. Foods that have a lot of magnesium in them include: ? Green leafy vegetables, such as spinach and broccoli. ? Beans and peas. ? Nuts and seeds, such as almonds and sunflower seeds. ? Whole grains, such as whole grain bread and fortified cereals.  Take magnesium supplements if your health care provider tells you to do that. Take them as directed.  Take over-the-counter and prescription medicines only as told by your health care provider.  Have your magnesium levels monitored as told by your health care provider.  When you are active, drink fluids that contain electrolytes.  Avoid drinking alcohol.  Keep all follow-up visits  as told by your health care provider. This is important. Contact a health care provider if:  You get worse instead of better.  Your symptoms return. Get help right away if you:  Develop severe muscle weakness.  Have trouble breathing.  Feel that your heart is  racing. Summary  Hypomagnesemia is a condition in which the level of magnesium in the blood is low.  Hypomagnesemia can affect every organ in the body.  Treatment may include eating more foods that contain magnesium, taking magnesium supplements, and not drinking alcohol.  Have your magnesium levels monitored as told by your health care provider. This information is not intended to replace advice given to you by your health care provider. Make sure you discuss any questions you have with your health care provider. Document Revised: 01/04/2017 Document Reviewed: 12/24/2016 Elsevier Patient Education  2020 [employer].

## 2019-12-03 ENCOUNTER — Telehealth: Payer: Self-pay | Admitting: Oncology

## 2019-12-03 NOTE — Telephone Encounter (Signed)
Scheduled appointments per 10/27 los. Called patient, no answer. Left message for patient with appointment date and time.

## 2019-12-04 ENCOUNTER — Inpatient Hospital Stay: Payer: BC Managed Care – PPO

## 2019-12-04 ENCOUNTER — Other Ambulatory Visit: Payer: Self-pay

## 2019-12-04 VITALS — BP 116/72 | HR 81 | Temp 98.8°F | Resp 18

## 2019-12-04 DIAGNOSIS — Z5112 Encounter for antineoplastic immunotherapy: Secondary | ICD-10-CM | POA: Diagnosis not present

## 2019-12-04 DIAGNOSIS — Z5189 Encounter for other specified aftercare: Secondary | ICD-10-CM | POA: Diagnosis not present

## 2019-12-04 DIAGNOSIS — C182 Malignant neoplasm of ascending colon: Secondary | ICD-10-CM

## 2019-12-04 DIAGNOSIS — G620 Drug-induced polyneuropathy: Secondary | ICD-10-CM | POA: Diagnosis not present

## 2019-12-04 DIAGNOSIS — C787 Secondary malignant neoplasm of liver and intrahepatic bile duct: Secondary | ICD-10-CM | POA: Diagnosis not present

## 2019-12-04 DIAGNOSIS — Z5111 Encounter for antineoplastic chemotherapy: Secondary | ICD-10-CM | POA: Diagnosis not present

## 2019-12-04 MED ORDER — PEGFILGRASTIM-CBQV 6 MG/0.6ML ~~LOC~~ SOSY
6.0000 mg | PREFILLED_SYRINGE | Freq: Once | SUBCUTANEOUS | Status: AC
  Administered 2019-12-04: 6 mg via SUBCUTANEOUS

## 2019-12-04 MED ORDER — PEGFILGRASTIM-CBQV 6 MG/0.6ML ~~LOC~~ SOSY
PREFILLED_SYRINGE | SUBCUTANEOUS | Status: AC
  Filled 2019-12-04: qty 0.6

## 2019-12-04 NOTE — Patient Instructions (Signed)

## 2019-12-09 ENCOUNTER — Encounter: Payer: Self-pay | Admitting: Oncology

## 2019-12-10 ENCOUNTER — Other Ambulatory Visit: Payer: Self-pay | Admitting: *Deleted

## 2019-12-10 DIAGNOSIS — C787 Secondary malignant neoplasm of liver and intrahepatic bile duct: Secondary | ICD-10-CM

## 2019-12-10 DIAGNOSIS — C182 Malignant neoplasm of ascending colon: Secondary | ICD-10-CM

## 2019-12-10 MED ORDER — PROCHLORPERAZINE MALEATE 10 MG PO TABS: 10.0000 mg | ORAL_TABLET | Freq: Four times a day (QID) | ORAL | 1 refills | Status: DC | PRN

## 2019-12-10 MED ORDER — LORAZEPAM 0.5 MG PO TABS: 0.5000 mg | ORAL_TABLET | Freq: Two times a day (BID) | ORAL | 0 refills | Status: DC | PRN

## 2019-12-10 MED ORDER — DIPHENOXYLATE-ATROPINE 2.5-0.025 MG PO TABS: ORAL_TABLET | ORAL | 0 refills | Status: DC

## 2019-12-22 ENCOUNTER — Encounter: Payer: Self-pay | Admitting: Oncology

## 2019-12-22 ENCOUNTER — Telehealth: Payer: Self-pay | Admitting: *Deleted

## 2019-12-22 MED ORDER — DOXYCYCLINE HYCLATE 100 MG PO TABS: 100.0000 mg | ORAL_TABLET | Freq: Two times a day (BID) | ORAL | 2 refills | Status: DC

## 2019-12-22 MED ORDER — FLUTICASONE PROPIONATE 0.05 % EX CREA: TOPICAL_CREAM | Freq: Two times a day (BID) | CUTANEOUS | 1 refills | Status: DC

## 2019-12-22 NOTE — Telephone Encounter (Addendum)
Patient left MyChart message that rash is worse and itching despite topicals. Has spread from chest/trunk to arms, legs, feet, face. Says she is miserable with the itching. She has been out of the doxycycline for "quite a while" and needs refill on the fluticasone cream as well. Per Dr. Benay Spice: Refill doxycycline and fluticasone. Rash should improve with this and with time. Sent MyChart message to patient with MD orders.

## 2019-12-24 ENCOUNTER — Other Ambulatory Visit: Payer: Self-pay | Admitting: Pharmacist

## 2019-12-24 ENCOUNTER — Encounter: Payer: Self-pay | Admitting: Nurse Practitioner

## 2019-12-24 ENCOUNTER — Other Ambulatory Visit: Payer: Self-pay

## 2019-12-24 ENCOUNTER — Telehealth: Payer: Self-pay | Admitting: *Deleted

## 2019-12-24 ENCOUNTER — Inpatient Hospital Stay: Payer: BC Managed Care – PPO | Attending: Oncology | Admitting: Nurse Practitioner

## 2019-12-24 ENCOUNTER — Inpatient Hospital Stay: Payer: BC Managed Care – PPO

## 2019-12-24 DIAGNOSIS — C7961 Secondary malignant neoplasm of right ovary: Secondary | ICD-10-CM | POA: Insufficient documentation

## 2019-12-24 DIAGNOSIS — Z95828 Presence of other vascular implants and grafts: Secondary | ICD-10-CM

## 2019-12-24 DIAGNOSIS — C182 Malignant neoplasm of ascending colon: Secondary | ICD-10-CM

## 2019-12-24 DIAGNOSIS — C787 Secondary malignant neoplasm of liver and intrahepatic bile duct: Secondary | ICD-10-CM | POA: Diagnosis not present

## 2019-12-24 DIAGNOSIS — C772 Secondary and unspecified malignant neoplasm of intra-abdominal lymph nodes: Secondary | ICD-10-CM | POA: Insufficient documentation

## 2019-12-24 DIAGNOSIS — Z8616 Personal history of COVID-19: Secondary | ICD-10-CM | POA: Insufficient documentation

## 2019-12-24 DIAGNOSIS — L988 Other specified disorders of the skin and subcutaneous tissue: Secondary | ICD-10-CM | POA: Insufficient documentation

## 2019-12-24 DIAGNOSIS — K632 Fistula of intestine: Secondary | ICD-10-CM | POA: Diagnosis not present

## 2019-12-24 LAB — CBC WITH DIFFERENTIAL (CANCER CENTER ONLY)
Abs Immature Granulocytes: 0.03 10*3/uL (ref 0.00–0.07)
Basophils Absolute: 0 10*3/uL (ref 0.0–0.1)
Basophils Relative: 0 %
Eosinophils Absolute: 0.2 10*3/uL (ref 0.0–0.5)
Eosinophils Relative: 4 %
HCT: 36.1 % (ref 36.0–46.0)
Hemoglobin: 11.3 g/dL — ABNORMAL LOW (ref 12.0–15.0)
Immature Granulocytes: 1 %
Lymphocytes Relative: 18 %
Lymphs Abs: 0.9 10*3/uL (ref 0.7–4.0)
MCH: 25.1 pg — ABNORMAL LOW (ref 26.0–34.0)
MCHC: 31.3 g/dL (ref 30.0–36.0)
MCV: 80.2 fL (ref 80.0–100.0)
Monocytes Absolute: 0.3 10*3/uL (ref 0.1–1.0)
Monocytes Relative: 6 %
Neutro Abs: 3.8 10*3/uL (ref 1.7–7.7)
Neutrophils Relative %: 71 %
Platelet Count: 186 10*3/uL (ref 150–400)
RBC: 4.5 MIL/uL (ref 3.87–5.11)
RDW: 17 % — ABNORMAL HIGH (ref 11.5–15.5)
WBC Count: 5.3 10*3/uL (ref 4.0–10.5)
nRBC: 0 % (ref 0.0–0.2)

## 2019-12-24 LAB — CMP (CANCER CENTER ONLY)
ALT: 20 U/L (ref 0–44)
AST: 18 U/L (ref 15–41)
Albumin: 3.7 g/dL (ref 3.5–5.0)
Alkaline Phosphatase: 117 U/L (ref 38–126)
Anion gap: 6 (ref 5–15)
BUN: 8 mg/dL (ref 6–20)
CO2: 25 mmol/L (ref 22–32)
Calcium: 8.8 mg/dL — ABNORMAL LOW (ref 8.9–10.3)
Chloride: 109 mmol/L (ref 98–111)
Creatinine: 0.68 mg/dL (ref 0.44–1.00)
GFR, Estimated: >60 mL/min (ref >60)
Glucose, Bld: 187 mg/dL — ABNORMAL HIGH (ref 70–99)
Potassium: 4.1 mmol/L (ref 3.5–5.1)
Sodium: 140 mmol/L (ref 135–145)
Total Bilirubin: 0.4 mg/dL (ref 0.3–1.2)
Total Protein: 6.8 g/dL (ref 6.5–8.1)

## 2019-12-24 LAB — MAGNESIUM: Magnesium: 1.2 mg/dL — ABNORMAL LOW (ref 1.7–2.4)

## 2019-12-24 LAB — CEA (IN HOUSE-CHCC): CEA (CHCC-In House): 2.27 ng/mL (ref 0.00–5.00)

## 2019-12-24 MED ORDER — HEPARIN SOD (PORK) LOCK FLUSH 100 UNIT/ML IV SOLN
500.0000 [IU] | Freq: Once | INTRAVENOUS | Status: AC
  Administered 2019-12-24: 500 [IU]
  Filled 2019-12-24: qty 5

## 2019-12-24 MED ORDER — SODIUM CHLORIDE 0.9% FLUSH
10.0000 mL | Freq: Once | INTRAVENOUS | Status: AC
  Administered 2019-12-24: 10 mL
  Filled 2019-12-24: qty 10

## 2019-12-24 MED ORDER — MAGNESIUM OXIDE 400 (241.3 MG) MG PO TABS: 400.0000 mg | ORAL_TABLET | Freq: Two times a day (BID) | ORAL | 2 refills | Status: DC

## 2019-12-24 NOTE — Progress Notes (Signed)
[facility] OFFICE PROGRESS NOTE   Diagnosis: Colon cancer  INTERVAL HISTORY:   [name] returns as scheduled.  She completed another cycle of irinotecan/Panitumumab 12/02/2019.  She is now on a treatment break pending fistula repair surgery scheduled 01/07/2020.  The skin rash worsened recently, significant associated pruritus.  She has resumed doxycycline and is using fluticasone in particularly bothersome areas.  No significant diarrhea.  No nausea or vomiting.  No mouth sores.  Minimal output from the fistula.  Objective:  Vital signs in last 24 hours:  Blood pressure 120/77, pulse 76, temperature 97.9 F (36.6 C), temperature source Tympanic, resp. rate 16, height 5' 6"  (1.676 m), weight 151 lb 6.4 oz (68.7 kg), SpO2 100 %.    HEENT: No thrush or ulcers. Resp: Lungs clear bilaterally. Cardio: Regular rate and rhythm. GI: No hepatomegaly.  Left abdomen fistula site with mild surrounding erythema. Vascular: No leg edema. Neuro: Alert and oriented. Skin: Acne type rash over the trunk, face, extremities.  Skin in general has a very dry appearance.  Linear ulceration involving multiple fingertips. Port-A-Cath without erythema.  Lab Results:  Lab Results  Component Value Date   WBC 5.3 12/24/2019   HGB 11.3 (L) 12/24/2019   HCT 36.1 12/24/2019   MCV 80.2 12/24/2019   PLT 186 12/24/2019   NEUTROABS 3.8 12/24/2019    Imaging:  No results found.  Medications: I have reviewed the patient's current medications.  Assessment/Plan: 1. 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  The tumor returned microsatellite stable with no loss of mismatch repair protein expression   APC mutated. No BRAF, KRAS, or NRAS mutation On [facility] 1 testing   Cycle 1 adjuvant FOLFOX 09/08/2013   Cycle 2 adjuvant FOLFOX 09/24/2013   Cycle 3 adjuvant FOLFOX 10/08/2013.    Cycle 4 adjuvant FOLFOX 10/22/2013.   Cycle 5 adjuvant FOLFOX 11/05/2013. Oxaliplatin held due to thrombocytopenia.  Cycle 6 FOLFOX 11/19/2013.  Cycle 7 FOLFOX 12/03/2013. Oxaliplatin held secondary to thrombocytopenia.  Cycle 8 FOLFOX 12/17/2013.  Cycle 9 FOLFOX 01/04/2014. Oxaliplatin held secondary to neutropenia.   Cycle 10 FOLFOX 01/21/2014. Oxaliplatin held secondary to thrombocytopenia.  Cycle 11 FOLFOX 02/04/2014  Cycle 12 FOLFOX 02/18/2014, oxaliplatin dose reduced secondary tothrombocytopenia  CT abdomen/pelvis 01/30/2014 revealed splenomegaly and no evidence of recurrent colon cancer  CT chest 04/07/2014 with a stable right lower lobe nodule and no evidence for metastatic disease, no nodules seen on the CT 11/26/2014  Markedly elevated CEA 11/24/2014  CT 11/26/2014 revealed a right pelvic mass, splenomegaly, small volume ascites  Right salpingo-oophorectomy 12/28/2014 with the pathology confirming metastatic colon cancer  CTs 03/23/2016-no evidence of recurrent or metastatic disease.  CT 11/26/2016-enlargement of a fluid density structure the right pelvic sidewall, no other evidence of metastatic disease  CT aspiration right pelvic cyst 12/19/2016.Cytology-BENIGN REACTIVE/REPARATIVE CHANGES.  CTs 06/05/2017-no evidence of metastatic disease, mild cirrhotic changes with splenomegaly  CTs 12/11/2017-recurrent cystic right adnexal mass, similar to on the CT 11/26/2016, stable mild splenomegaly  CTs 04/23/2018-enlargement of cystic right adnexal mass with mural nodularity, no other evidence of metastatic disease  Cytoreductive surgery/HIPEC with mitomycin by Dr. Clovis Riley at [facility] 08/12/2018-R1 resection achieved. Cytoreduction included omentectomy, LAR, right salpingo-oophorectomy and left colonic gutter/pelvic stripping. Pathology on the rectum showed recurrent/metastatic adenocarcinoma, tumor 2.0 cm, predominantly involving the subserosa and muscularis propria  of the colon, proximal and distal margins of resection were negative, vascular invasion present, metastatic carcinoma present in  1 out of 5 lymph nodes; omentum resection with no malignancy seen, no metastatic carcinoma identified in 1 lymph node examined; left gutter stripping positive metastatic adenocarcinoma; right ovary resection positive metastatic adenocarcinoma.  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  CEA 68 02/03/2019  Biopsy liver lesion 02/11/2019-metastatic adenocarcinoma consistent with primary colonic adenocarcinoma  CTs 02/27/2019-multiple liver lesions increased in size, new lesion in the lateral right lobe of the liver. No evidence of metastatic disease in the chest. Redemonstrated moderate left hydronephrosis and proximal hydroureter without discrete lesion or obstructing etiology at the transition point of the mid ureter.  Cycle 1 FOLFIRI/Panitumumab 03/12/2019  Cycle 2 FOLFIRI/Panitumumab 03/26/2019-bolus 5-FU and irinotecan held secondary to neutropenia  Cycle 3 FOLFIRI/Panitumumab 04/09/2019, Udenyca added-not given secondary to seizure/discontinuation of the 5-FU pump  CT abdomen[facility]pelvis at [facility] 05/04/2019-no residual fluid collection at the left abdominal wall abscess, stable moderate left hydronephrosis, multifocal indeterminate liver lesions--liver lesionssignificantly improved  Cycle 4 FOLFIRI/Panitumumab 05/07/2019, Udenyca  Cycle 5 FOLFIRI/Panitumumab 05/20/2019, Udenyca  Cycle 6 FOLFIRI/Panitumumab 06/18/2019, Udenyca  Cycle 7 FOLFIRI/Panitumumab 07/01/2019 (Irinotecan dose reduced due to thrombocytopenia), Udenyca--Udenyca was not given  Cycle 8 FOLFIRI/Panitumumab 07/15/2019, Udenyca  Cycle 9 FOLFIRI/Panitumumab 07/29/2019, Udenyca  Cycle 10 FOLFIRI/Panitumumab 08/13/2019, Udenyca  CTs at [facility] 08/19/2019-decreased size of the hypodense and hyperdense lesions within segment 2 of  the left hepatic lobe. No new lesions identified. Similar presacral soft tissue thickening with adjacent stable alignment in the rectum. Improved but persistent left UPJ obstruction with mild left hydronephrosis. Persistent but decreased size of the wound within the left mid abdomen abdominal wall.  Cycle 11 irinotecan/Panitumumab 08/27/2019, Udenyca  Cycle 12 irinotecan/Panitumumab 10/08/2019, Udenyca  Cycle 13 irinotecan/Panitumumab 11/05/2019, Udenyca  CT abdomen/pelvis 11/19/2019-no new or progressive interval findings.  Stable appearance of the calcified lesion in the anterior left hepatic dome with second tiny low-density lesion in the dome of the lateral segment left liver.  Both lesions are markedly decreased since 02/27/2019.  Stable mild fullness left intrarenal collecting system and renal pelvis.  Similar appearance of abnormal soft tissue in the left paramidline subcutaneous fat with associated skin thickening, this likely correlates to the fistula.  Cycle 14 irinotecan/Panitumumab 11/19/2019, Udenyca  Cycle 15 irinotecan/Panitumumab 12/02/2019, Udenyca 2. Mild elevation of the CEA beginning January 2016, normal on 05/19/2014   3. History of iron deficiency anemia  4. seizure disorder; seizure 04/09/2019. Brain CT negative. Now on Keppra. 5. history of depression  6. 4 mm right lower lobe nodule on a staging chest CT 09/08/2013 , stable on a CT 04/07/2014 7. Hospitalization 09/24/2013 through 09/26/2013 with fever and abdominal pain.  8. 09/24/2013 urine culture positive for coag negative staph.  9. 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 Splenomegaly noted on a CT scan 01/30/2014,persistent on repeat CTs 12. Colonoscopy 11/17/2018-flexible sigmoidoscopy per rectum with changes of mild diversion colitis. Scope advanced for approximately 25 cm. Most proximal portion had necrotic  appearing debris. Colostomy bag insufflated suggesting some type of communication between the pouch and the proximal colon. Introduction of scope into the ostomy found to available directions. 1 toward the distal pouch with similar appearing necrotic debris encountered. The other was about a 30 cm segment of normal-appearing colonic mucosa to the level of the previous right hemicolectomy ileocolonic anastomosis. 79. Port-A-Cath placement interventional radiology 03/05/2019 14. Left leg/foot weakness-brain MRI 09/03/2019 with no evidence of metastatic disease, referred to physical therapy 15. 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 COVID-19 + 09/10/2019 17. Hospitalized with seizure 09/17/2019 through 09/19/2019-MRI brain without evidence of metastatic disease. Seen by neurology. Keppra dose increased. 19.  Rash secondary to Panitumumab   Disposition: [name] appears stable.  She is currently on a treatment break pending upcoming fistula surgery which is scheduled 01/07/2020.  She does not want to resume treatment with irinotecan/Panitumumab until 4 weeks following the surgery.  We reviewed the CBC from today.  Counts are stable.  The skin rash is related to Panitumumab.  She will continue doxycycline and fluticasone, apply lotion liberally.  She will return for a Port-A-Cath flush and follow-up appointment in 4 weeks.  We are available to see her sooner if needed.   Ned Card ANP/GNP-BC   12/24/2019  10:16 AM

## 2019-12-24 NOTE — Telephone Encounter (Signed)
Left VM that her magnesium level is 1.2 and per NP she needs 4 grams IV Mg+ tomorrow. High priority scheduling message sent.

## 2019-12-25 ENCOUNTER — Inpatient Hospital Stay: Payer: BC Managed Care – PPO

## 2019-12-25 ENCOUNTER — Telehealth: Payer: Self-pay | Admitting: Nurse Practitioner

## 2019-12-25 ENCOUNTER — Other Ambulatory Visit: Payer: Self-pay

## 2019-12-25 DIAGNOSIS — C182 Malignant neoplasm of ascending colon: Secondary | ICD-10-CM | POA: Diagnosis not present

## 2019-12-25 DIAGNOSIS — C7961 Secondary malignant neoplasm of right ovary: Secondary | ICD-10-CM | POA: Diagnosis not present

## 2019-12-25 DIAGNOSIS — L988 Other specified disorders of the skin and subcutaneous tissue: Secondary | ICD-10-CM | POA: Diagnosis not present

## 2019-12-25 DIAGNOSIS — C772 Secondary and unspecified malignant neoplasm of intra-abdominal lymph nodes: Secondary | ICD-10-CM | POA: Diagnosis not present

## 2019-12-25 DIAGNOSIS — Z8616 Personal history of COVID-19: Secondary | ICD-10-CM | POA: Diagnosis not present

## 2019-12-25 MED ORDER — SODIUM CHLORIDE 0.9% FLUSH
10.0000 mL | INTRAVENOUS | Status: AC | PRN
  Administered 2019-12-25: 10 mL
  Filled 2019-12-25: qty 10

## 2019-12-25 MED ORDER — HEPARIN SOD (PORK) LOCK FLUSH 100 UNIT/ML IV SOLN
500.0000 [IU] | INTRAVENOUS | Status: AC | PRN
  Administered 2019-12-25: 500 [IU]
  Filled 2019-12-25: qty 5

## 2019-12-25 MED ORDER — MAGNESIUM SULFATE 4 GM/100ML IV SOLN
4.0000 g | Freq: Once | INTRAVENOUS | Status: AC
  Administered 2019-12-25: 4 g via INTRAVENOUS
  Filled 2019-12-25: qty 100

## 2019-12-25 NOTE — Telephone Encounter (Signed)
Scheduled appointments per 11/19 los. Will have updated calendar printed for patient at next visit.

## 2019-12-25 NOTE — Patient Instructions (Signed)
Hypomagnesemia Hypomagnesemia is a condition in which the level of magnesium in the blood is low. Magnesium is a mineral that is found in many foods. It is used in many different processes in the body. Hypomagnesemia can affect every organ in the body. In severe cases, it can cause life-threatening problems. What are the causes? This condition may be caused by:  Not getting enough magnesium in your diet.  Malnutrition.  Problems with absorbing magnesium from the intestines.  Dehydration.  Alcohol abuse.  Vomiting.  Severe or chronic diarrhea.  Some medicines, including medicines that make you urinate more (diuretics).  Certain diseases, such as kidney disease, diabetes, celiac disease, and overactive thyroid. What are the signs or symptoms? Symptoms of this condition include:  Loss of appetite.  Nausea and vomiting.  Involuntary shaking or trembling of a body part (tremor).  Muscle weakness.  Tingling in the arms and legs.  Sudden tightening of muscles (muscle spasms).  Confusion.  Psychiatric issues, such as depression, irritability, or psychosis.  A feeling of fluttering of the heart.  Seizures. These symptoms are more severe if magnesium levels drop suddenly. How is this diagnosed? This condition may be diagnosed based on:  Your symptoms and medical history.  A physical exam.  Blood and urine tests. How is this treated? Treatment depends on the cause and the severity of the condition. It may be treated with:  A magnesium supplement. This can be taken in pill form. If the condition is severe, magnesium is usually given through an IV.  Changes to your diet. You may be directed to eat foods that have a lot of magnesium, such as green leafy vegetables, peas, beans, and nuts.  Stopping any intake of alcohol. Follow these instructions at home:      Make sure that your diet includes foods with magnesium. Foods that have a lot of magnesium in them  include: ? Green leafy vegetables, such as spinach and broccoli. ? Beans and peas. ? Nuts and seeds, such as almonds and sunflower seeds. ? Whole grains, such as whole grain bread and fortified cereals.  Take magnesium supplements if your health care provider tells you to do that. Take them as directed.  Take over-the-counter and prescription medicines only as told by your health care provider.  Have your magnesium levels monitored as told by your health care provider.  When you are active, drink fluids that contain electrolytes.  Avoid drinking alcohol.  Keep all follow-up visits as told by your health care provider. This is important. Contact a health care provider if:  You get worse instead of better.  Your symptoms return. Get help right away if you:  Develop severe muscle weakness.  Have trouble breathing.  Feel that your heart is racing. Summary  Hypomagnesemia is a condition in which the level of magnesium in the blood is low.  Hypomagnesemia can affect every organ in the body.  Treatment may include eating more foods that contain magnesium, taking magnesium supplements, and not drinking alcohol.  Have your magnesium levels monitored as told by your health care provider. This information is not intended to replace advice given to you by your health care provider. Make sure you discuss any questions you have with your health care provider. Document Revised: 01/04/2017 Document Reviewed: 12/24/2016 Elsevier Patient Education  2020 [employer]  

## 2019-12-28 ENCOUNTER — Other Ambulatory Visit: Payer: Self-pay | Admitting: Neurology

## 2019-12-28 MED ORDER — LACOSAMIDE 200 MG PO TABS: 200.0000 mg | ORAL_TABLET | Freq: Two times a day (BID) | ORAL | 4 refills | Status: DC

## 2020-01-01 ENCOUNTER — Telehealth: Payer: Self-pay | Admitting: *Deleted

## 2020-01-01 NOTE — Telephone Encounter (Signed)
Ingrown toenails are very painful. Can't were closed toe shoes without pain. Asking what can be done? Also asking if office can do her preop COVID test? Instructed her to soak feet in warm water and Epsom salt 15 minutes 3-4 times/day and apply antibiotic ointment afterwards. Per Lucianne Lei, PA the office only does COVID testing for patient in office who are being admitted or symptomatic. Provided the [facility] [phone] to call and make appointment for the COVID test.

## 2020-01-02 ENCOUNTER — Other Ambulatory Visit: Payer: BC Managed Care – PPO

## 2020-01-04 NOTE — Telephone Encounter (Signed)
Left VM that Dr. Benay Spice said she could try using a steroid cream to ingrown toe nails.

## 2020-01-07 DIAGNOSIS — R739 Hyperglycemia, unspecified: Secondary | ICD-10-CM | POA: Diagnosis not present

## 2020-01-07 DIAGNOSIS — Z433 Encounter for attention to colostomy: Secondary | ICD-10-CM | POA: Diagnosis not present

## 2020-01-07 DIAGNOSIS — F419 Anxiety disorder, unspecified: Secondary | ICD-10-CM | POA: Diagnosis not present

## 2020-01-07 DIAGNOSIS — Z90710 Acquired absence of both cervix and uterus: Secondary | ICD-10-CM | POA: Diagnosis not present

## 2020-01-07 DIAGNOSIS — Z9049 Acquired absence of other specified parts of digestive tract: Secondary | ICD-10-CM | POA: Diagnosis not present

## 2020-01-07 DIAGNOSIS — K219 Gastro-esophageal reflux disease without esophagitis: Secondary | ICD-10-CM | POA: Diagnosis not present

## 2020-01-07 DIAGNOSIS — Z8616 Personal history of COVID-19: Secondary | ICD-10-CM | POA: Diagnosis not present

## 2020-01-07 DIAGNOSIS — C182 Malignant neoplasm of ascending colon: Secondary | ICD-10-CM | POA: Diagnosis not present

## 2020-01-07 DIAGNOSIS — C786 Secondary malignant neoplasm of retroperitoneum and peritoneum: Secondary | ICD-10-CM | POA: Diagnosis not present

## 2020-01-07 DIAGNOSIS — G2581 Restless legs syndrome: Secondary | ICD-10-CM | POA: Diagnosis not present

## 2020-01-07 DIAGNOSIS — F32A Depression, unspecified: Secondary | ICD-10-CM | POA: Diagnosis not present

## 2020-01-07 DIAGNOSIS — K660 Peritoneal adhesions (postprocedural) (postinfection): Secondary | ICD-10-CM | POA: Diagnosis not present

## 2020-01-07 DIAGNOSIS — Z20822 Contact with and (suspected) exposure to covid-19: Secondary | ICD-10-CM | POA: Diagnosis not present

## 2020-01-07 DIAGNOSIS — Z79899 Other long term (current) drug therapy: Secondary | ICD-10-CM | POA: Diagnosis not present

## 2020-01-07 DIAGNOSIS — Z880 Allergy status to penicillin: Secondary | ICD-10-CM | POA: Diagnosis not present

## 2020-01-07 DIAGNOSIS — K632 Fistula of intestine: Secondary | ICD-10-CM | POA: Diagnosis not present

## 2020-01-07 DIAGNOSIS — G40B09 Juvenile myoclonic epilepsy, not intractable, without status epilepticus: Secondary | ICD-10-CM | POA: Diagnosis not present

## 2020-01-12 ENCOUNTER — Ambulatory Visit: Payer: BC Managed Care – PPO | Admitting: Neurology

## 2020-01-15 ENCOUNTER — Encounter: Payer: Self-pay | Admitting: Oncology

## 2020-01-21 ENCOUNTER — Inpatient Hospital Stay: Payer: BC Managed Care – PPO | Attending: Oncology | Admitting: Oncology

## 2020-01-21 ENCOUNTER — Inpatient Hospital Stay: Payer: BC Managed Care – PPO

## 2020-01-21 ENCOUNTER — Other Ambulatory Visit: Payer: Self-pay

## 2020-01-21 VITALS — BP 137/87 | HR 95 | Temp 98.9°F | Resp 17 | Ht 66.0 in | Wt 150.3 lb

## 2020-01-21 DIAGNOSIS — Z8616 Personal history of COVID-19: Secondary | ICD-10-CM | POA: Insufficient documentation

## 2020-01-21 DIAGNOSIS — Z933 Colostomy status: Secondary | ICD-10-CM | POA: Diagnosis not present

## 2020-01-21 DIAGNOSIS — C182 Malignant neoplasm of ascending colon: Secondary | ICD-10-CM | POA: Diagnosis not present

## 2020-01-21 DIAGNOSIS — D509 Iron deficiency anemia, unspecified: Secondary | ICD-10-CM | POA: Insufficient documentation

## 2020-01-21 DIAGNOSIS — Z452 Encounter for adjustment and management of vascular access device: Secondary | ICD-10-CM | POA: Diagnosis not present

## 2020-01-21 DIAGNOSIS — G40909 Epilepsy, unspecified, not intractable, without status epilepticus: Secondary | ICD-10-CM | POA: Diagnosis not present

## 2020-01-21 DIAGNOSIS — Z79899 Other long term (current) drug therapy: Secondary | ICD-10-CM | POA: Insufficient documentation

## 2020-01-21 DIAGNOSIS — R21 Rash and other nonspecific skin eruption: Secondary | ICD-10-CM | POA: Insufficient documentation

## 2020-01-21 DIAGNOSIS — R161 Splenomegaly, not elsewhere classified: Secondary | ICD-10-CM | POA: Insufficient documentation

## 2020-01-21 DIAGNOSIS — Z9221 Personal history of antineoplastic chemotherapy: Secondary | ICD-10-CM | POA: Diagnosis not present

## 2020-01-21 NOTE — Progress Notes (Signed)
[place] OFFICE PROGRESS NOTE   Diagnosis: Colon cancer  INTERVAL HISTORY:   Ms. 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  No tumor implants were noted, though the pelvis was not completely visualized.  She was discharged from the hospital on 01/11/2020.  She reports numbness in the left hand following discharge from the hospital.  She now has numbness only in the left fourth and fifth fingers.  No neck or back pain.  No weakness.  No seizure.  Her husband is packing the abdominal wound.  She is scheduled to see Dr. Clovis Riley next week.    Objective:  Vital signs in last 24 hours:  Blood pressure 137/87, pulse 95, temperature 98.9 F (37.2 C), temperature source Tympanic, resp. rate 17, height 5' 6"  (1.676 m), weight 150 lb 4.8 oz (68.2 kg), SpO2 100 %.    Resp: Lungs clear bilaterally Cardio: Regular rate and rhythm GI: No hepatomegaly, midline wound with staples in place, left abdominal wound with a packing in place Vascular: No leg edema Neuro: Strength appears intact at both arms and hands    Portacath/PICC-without erythema  Lab Results:  Lab Results  Component Value Date   WBC 5.3 12/24/2019   HGB 11.3 (L) 12/24/2019   HCT 36.1 12/24/2019   MCV 80.2 12/24/2019   PLT 186 12/24/2019   NEUTROABS 3.8 12/24/2019    CMP  Lab Results  Component Value Date   NA 140 12/24/2019   K 4.1 12/24/2019   CL 109 12/24/2019   CO2 25 12/24/2019   GLUCOSE 187 (H) 12/24/2019   BUN 8 12/24/2019   CREATININE 0.68 12/24/2019   CALCIUM 8.8 (L) 12/24/2019   PROT 6.8 12/24/2019   ALBUMIN 3.7 12/24/2019   AST 18 12/24/2019   ALT 20 12/24/2019   ALKPHOS 117 12/24/2019   BILITOT 0.4 12/24/2019   GFRNONAA >60 12/24/2019   GFRAA >60 11/05/2019    Lab Results  Component Value Date   CEA1 2.27 12/24/2019     Medications: I have reviewed the patient's current medications.   Assessment/Plan: 1. 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  The tumor returned microsatellite stable with no loss of mismatch repair protein expression   APC mutated. No BRAF, KRAS, or NRAS mutation On Foundation 1 testing   Cycle 1 adjuvant FOLFOX 09/08/2013   Cycle 2 adjuvant FOLFOX 09/24/2013   Cycle 3 adjuvant FOLFOX 10/08/2013.   Cycle 4 adjuvant FOLFOX 10/22/2013.   Cycle 5 adjuvant FOLFOX 11/05/2013. Oxaliplatin held due to thrombocytopenia.  Cycle 6 FOLFOX 11/19/2013.  Cycle 7 FOLFOX 12/03/2013. Oxaliplatin held secondary to thrombocytopenia.  Cycle 8 FOLFOX 12/17/2013.  Cycle 9 FOLFOX 01/04/2014. Oxaliplatin held secondary to neutropenia.   Cycle 10 FOLFOX 01/21/2014. Oxaliplatin held secondary to thrombocytopenia.  Cycle 11 FOLFOX 02/04/2014  Cycle 12 FOLFOX 02/18/2014, oxaliplatin dose reduced secondary tothrombocytopenia  CT abdomen/pelvis 01/30/2014 revealed splenomegaly and no evidence of recurrent colon cancer  CT chest 04/07/2014 with a stable right lower lobe nodule and no evidence for metastatic disease, no nodules seen on the CT 11/26/2014  Markedly elevated CEA 11/24/2014  CT 11/26/2014 revealed a right pelvic mass, splenomegaly, small volume ascites  Right salpingo-oophorectomy 12/28/2014 with the pathology confirming metastatic colon cancer  CTs 03/23/2016-no evidence of recurrent or metastatic disease.  CT 11/26/2016-enlargement of a fluid density structure the right pelvic sidewall, no other evidence of metastatic disease  CT  aspiration right pelvic cyst 12/19/2016.Cytology-BENIGN REACTIVE/REPARATIVE CHANGES.  CTs 06/05/2017-no evidence of metastatic disease, mild cirrhotic changes with splenomegaly  CTs 12/11/2017-recurrent cystic right adnexal mass, similar to on the CT 11/26/2016, stable mild splenomegaly  CTs 04/23/2018-enlargement of cystic right adnexal mass  with mural nodularity, no other evidence of metastatic disease  Cytoreductive surgery/HIPEC with mitomycin by Dr. Clovis Riley at [facility] 08/12/2018-R1 resection achieved. Cytoreduction included omentectomy, LAR, right salpingo-oophorectomy and left colonic gutter/pelvic stripping. Pathology on the rectum showed recurrent/metastatic adenocarcinoma, tumor 2.0 cm, predominantly involving the subserosa and muscularis propria of the colon, proximal and distal margins of resection were negative, vascular invasion present, metastatic carcinoma present in 1 out of 5 lymph nodes; omentum resection with no malignancy seen, no metastatic carcinoma identified in 1 lymph node examined; left gutter stripping positive metastatic adenocarcinoma; right ovary resection positive metastatic adenocarcinoma.  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  CEA 68 02/03/2019  Biopsy liver lesion 02/11/2019-metastatic adenocarcinoma consistent with primary colonic adenocarcinoma  CTs 02/27/2019-multiple liver lesions increased in size, new lesion in the lateral right lobe of the liver. No evidence of metastatic disease in the chest. Redemonstrated moderate left hydronephrosis and proximal hydroureter without discrete lesion or obstructing etiology at the transition point of the mid ureter.  Cycle 1 FOLFIRI/Panitumumab 03/12/2019  Cycle 2 FOLFIRI/Panitumumab 03/26/2019-bolus 5-FU and irinotecan held secondary to neutropenia  Cycle 3 FOLFIRI/Panitumumab 04/09/2019, Udenyca added-not given secondary to seizure/discontinuation of the 5-FU pump  CT abdomen[facility]pelvis [facility] 05/04/2019-no residual fluid collection at the left abdominal wall abscess, stable moderate left hydronephrosis, multifocal indeterminate liver lesions--liver lesionssignificantly improved  Cycle 4 FOLFIRI/Panitumumab 05/07/2019, Udenyca  Cycle 5 FOLFIRI/Panitumumab 05/20/2019, Udenyca  Cycle  6 FOLFIRI/Panitumumab 06/18/2019, Udenyca  Cycle 7 FOLFIRI/Panitumumab 07/01/2019 (Irinotecan dose reduced due to thrombocytopenia), Udenyca--Udenyca was not given  Cycle 8 FOLFIRI/Panitumumab 07/15/2019, Udenyca  Cycle 9 FOLFIRI/Panitumumab 07/29/2019, Udenyca  Cycle 10 FOLFIRI/Panitumumab 08/13/2019, Udenyca  CTs at [facility] 08/19/2019-decreased size of the hypodense and hyperdense lesions within segment 2 of the left hepatic lobe. No new lesions identified. Similar presacral soft tissue thickening with adjacent stable alignment in the rectum. Improved but persistent left UPJ obstruction with mild left hydronephrosis. Persistent but decreased size of the wound within the left mid abdomen abdominal wall.  Cycle 11 irinotecan/Panitumumab 08/27/2019, Udenyca  Cycle 12 irinotecan/Panitumumab 10/08/2019, Udenyca  Cycle 13 irinotecan/Panitumumab 11/05/2019, Udenyca  CT abdomen/pelvis 11/19/2019-no new or progressive interval findings.  Stable appearance of the calcified lesion in the anterior left hepatic dome with second tiny low-density lesion in the dome of the lateral segment left liver.  Both lesions are markedly decreased since 02/27/2019.  Stable mild fullness left intrarenal collecting system and renal pelvis.  Similar appearance of abnormal soft tissue in the left paramidline subcutaneous fat with associated skin thickening, this likely correlates to the fistula.  Cycle 14 irinotecan/Panitumumab 11/19/2019, Udenyca  Cycle 15 irinotecan/Panitumumab 12/02/2019, Udenyca 2. Mild elevation of the CEA beginning January 2016, normal on 05/19/2014   3. History of iron deficiency anemia  4. seizure disorder; seizure 04/09/2019. Brain CT negative. Now on Keppra. 5. history of depression  6. 4 mm right lower lobe nodule on a staging chest CT 09/08/2013 , stable on a CT 04/07/2014 7. Hospitalization 09/24/2013 through 09/26/2013 with fever and abdominal pain.  8. 09/24/2013 urine culture  positive for coag negative staph.  9. 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 Splenomegaly noted on a CT scan 01/30/2014,persistent  on repeat CTs 12. Colonoscopy 11/17/2018-flexible sigmoidoscopy per rectum with changes of mild diversion colitis. Scope advanced for approximately 25 cm. Most proximal portion had necrotic appearing debris. Colostomy bag insufflated suggesting some type of communication between the pouch and the proximal colon. Introduction of scope into the ostomy found to available directions. 1 toward the distal pouch with similar appearing necrotic debris encountered. The other was about a 30 cm segment of normal-appearing colonic mucosa to the level of the previous right hemicolectomy ileocolonic anastomosis. 20. Port-A-Cath placement interventional radiology 03/05/2019 14. Left leg/foot weakness-brain MRI 09/03/2019 with no evidence of metastatic disease, referred to physical therapy 15. 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  No malignancy.  16. COVID-19 + 09/10/2019 17. Hospitalized with seizure 09/17/2019 through 09/19/2019-MRI brain without evidence of metastatic disease. Seen by neurology. Keppra dose increased. 19.  Rash secondary to Panitumumab    Disposition: [name] is recovering from the fistula takedown.  The left abdominal wound remains open with a packing in  place.  She is scheduled to see Dr. Clovis Riley next week.  She would like to hold on resuming irinotecan/Panitumumab until the surgical wound has completely healed.  She agrees to a follow-up visit with the plan to resume irinotecan and Panitumumab on 02/18/2019.  The etiology of the left hand numbness is unclear.  This appears to be improving.  She will contact us for new neurologic symptoms.  Betsy Coder, MD  01/21/2020  9:49 AM

## 2020-01-22 ENCOUNTER — Telehealth: Payer: Self-pay | Admitting: Oncology

## 2020-01-22 NOTE — Telephone Encounter (Signed)
Scheduled appointments per 12/16 los. Spoke to patient who is aware of appointments dates and times.  

## 2020-02-11 DIAGNOSIS — T8130XA Disruption of wound, unspecified, initial encounter: Secondary | ICD-10-CM | POA: Diagnosis not present

## 2020-02-11 DIAGNOSIS — T8131XA Disruption of external operation (surgical) wound, not elsewhere classified, initial encounter: Secondary | ICD-10-CM | POA: Diagnosis not present

## 2020-02-11 DIAGNOSIS — G8918 Other acute postprocedural pain: Secondary | ICD-10-CM | POA: Diagnosis not present

## 2020-02-11 DIAGNOSIS — Y839 Surgical procedure, unspecified as the cause of abnormal reaction of the patient, or of later complication, without mention of misadventure at the time of the procedure: Secondary | ICD-10-CM | POA: Diagnosis not present

## 2020-02-11 DIAGNOSIS — L988 Other specified disorders of the skin and subcutaneous tissue: Secondary | ICD-10-CM | POA: Diagnosis not present

## 2020-02-11 DIAGNOSIS — C189 Malignant neoplasm of colon, unspecified: Secondary | ICD-10-CM | POA: Diagnosis not present

## 2020-02-11 DIAGNOSIS — L03311 Cellulitis of abdominal wall: Secondary | ICD-10-CM | POA: Diagnosis not present

## 2020-02-11 DIAGNOSIS — K632 Fistula of intestine: Secondary | ICD-10-CM | POA: Diagnosis not present

## 2020-02-12 ENCOUNTER — Encounter: Payer: Self-pay | Admitting: Oncology

## 2020-02-14 ENCOUNTER — Other Ambulatory Visit: Payer: Self-pay | Admitting: Oncology

## 2020-02-18 ENCOUNTER — Inpatient Hospital Stay (HOSPITAL_BASED_OUTPATIENT_CLINIC_OR_DEPARTMENT_OTHER): Payer: BC Managed Care – PPO | Admitting: Oncology

## 2020-02-18 ENCOUNTER — Other Ambulatory Visit: Payer: Self-pay

## 2020-02-18 ENCOUNTER — Inpatient Hospital Stay: Payer: BC Managed Care – PPO | Attending: Oncology

## 2020-02-18 ENCOUNTER — Inpatient Hospital Stay: Payer: BC Managed Care – PPO

## 2020-02-18 VITALS — BP 140/85 | HR 85 | Temp 98.1°F | Resp 18 | Ht 66.0 in | Wt 153.6 lb

## 2020-02-18 DIAGNOSIS — Z95828 Presence of other vascular implants and grafts: Secondary | ICD-10-CM

## 2020-02-18 DIAGNOSIS — C182 Malignant neoplasm of ascending colon: Secondary | ICD-10-CM

## 2020-02-18 DIAGNOSIS — D6959 Other secondary thrombocytopenia: Secondary | ICD-10-CM | POA: Insufficient documentation

## 2020-02-18 DIAGNOSIS — Z5189 Encounter for other specified aftercare: Secondary | ICD-10-CM | POA: Insufficient documentation

## 2020-02-18 DIAGNOSIS — C787 Secondary malignant neoplasm of liver and intrahepatic bile duct: Secondary | ICD-10-CM | POA: Insufficient documentation

## 2020-02-18 DIAGNOSIS — Z5111 Encounter for antineoplastic chemotherapy: Secondary | ICD-10-CM | POA: Insufficient documentation

## 2020-02-18 DIAGNOSIS — Z5112 Encounter for antineoplastic immunotherapy: Secondary | ICD-10-CM | POA: Diagnosis not present

## 2020-02-18 DIAGNOSIS — C7961 Secondary malignant neoplasm of right ovary: Secondary | ICD-10-CM | POA: Diagnosis not present

## 2020-02-18 LAB — CBC WITH DIFFERENTIAL (CANCER CENTER ONLY)
Abs Immature Granulocytes: 0.03 10*3/uL (ref 0.00–0.07)
Basophils Absolute: 0 10*3/uL (ref 0.0–0.1)
Basophils Relative: 1 %
Eosinophils Absolute: 0.2 10*3/uL (ref 0.0–0.5)
Eosinophils Relative: 5 %
HCT: 35.1 % — ABNORMAL LOW (ref 36.0–46.0)
Hemoglobin: 10.9 g/dL — ABNORMAL LOW (ref 12.0–15.0)
Immature Granulocytes: 1 %
Lymphocytes Relative: 23 %
Lymphs Abs: 1 10*3/uL (ref 0.7–4.0)
MCH: 24 pg — ABNORMAL LOW (ref 26.0–34.0)
MCHC: 31.1 g/dL (ref 30.0–36.0)
MCV: 77.1 fL — ABNORMAL LOW (ref 80.0–100.0)
Monocytes Absolute: 0.4 10*3/uL (ref 0.1–1.0)
Monocytes Relative: 8 %
Neutro Abs: 2.6 10*3/uL (ref 1.7–7.7)
Neutrophils Relative %: 62 %
Platelet Count: 213 10*3/uL (ref 150–400)
RBC: 4.55 MIL/uL (ref 3.87–5.11)
RDW: 14.6 % (ref 11.5–15.5)
WBC Count: 4.2 10*3/uL (ref 4.0–10.5)
nRBC: 0 % (ref 0.0–0.2)

## 2020-02-18 LAB — CMP (CANCER CENTER ONLY)
ALT: 19 U/L (ref 0–44)
AST: 15 U/L (ref 15–41)
Albumin: 3.5 g/dL (ref 3.5–5.0)
Alkaline Phosphatase: 113 U/L (ref 38–126)
Anion gap: 7 (ref 5–15)
BUN: 11 mg/dL (ref 6–20)
CO2: 27 mmol/L (ref 22–32)
Calcium: 9.6 mg/dL (ref 8.9–10.3)
Chloride: 107 mmol/L (ref 98–111)
Creatinine: 0.84 mg/dL (ref 0.44–1.00)
GFR, Estimated: >60 mL/min (ref >60)
Glucose, Bld: 192 mg/dL — ABNORMAL HIGH (ref 70–99)
Potassium: 4.2 mmol/L (ref 3.5–5.1)
Sodium: 141 mmol/L (ref 135–145)
Total Bilirubin: 0.4 mg/dL (ref 0.3–1.2)
Total Protein: 7.3 g/dL (ref 6.5–8.1)

## 2020-02-18 LAB — MAGNESIUM: Magnesium: 1.7 mg/dL (ref 1.7–2.4)

## 2020-02-18 LAB — CEA (IN HOUSE-CHCC): CEA (CHCC-In House): 3.51 ng/mL (ref 0.00–5.00)

## 2020-02-18 MED ORDER — SODIUM CHLORIDE 0.9% FLUSH
10.0000 mL | Freq: Once | INTRAVENOUS | Status: AC
  Administered 2020-02-18: 10 mL
  Filled 2020-02-18: qty 10

## 2020-02-18 MED ORDER — HEPARIN SOD (PORK) LOCK FLUSH 100 UNIT/ML IV SOLN
500.0000 [IU] | Freq: Once | INTRAVENOUS | Status: AC
  Administered 2020-02-18: 500 [IU]
  Filled 2020-02-18: qty 5

## 2020-02-18 MED ORDER — FLUTICASONE PROPIONATE 0.05 % EX CREA: TOPICAL_CREAM | Freq: Two times a day (BID) | CUTANEOUS | 1 refills | Status: DC

## 2020-02-18 NOTE — Progress Notes (Signed)
Dressing change to LUQ wound. Open area the size of a pencil eraser packed with saline soaked 2x2. Dry 4x4 and occlusive tape applied. Wound noted to be pink on the edges, approximately 1/4 in deep, no odor or drainage noted.

## 2020-02-18 NOTE — Patient Instructions (Signed)

## 2020-02-18 NOTE — Progress Notes (Signed)
[place] OFFICE PROGRESS NOTE   Diagnosis: Colon cancer  INTERVAL HISTORY:   [name] returns as scheduled.  She reports malaise since returning to work.  No seizures.  No emotional lability since starting Vimpat.  The abdominal wound is healing.  She was seen at [facility] on 02/11/2020 with pain and erythema surrounding the abdominal incision.  A CT revealed no evidence of an abscess or fluid collection.  Subcutaneous inflammation was noted at the incision.  No evidence of progressive cancer.  She was placed on clindamycin, but she is allergic to this.  Clindamycin was discontinued and she began doxycycline earlier this week.  She developed erythema at the dorsum of the hands with sun exposure while on doxycycline.   Objective:  Vital signs in last 24 hours:  Blood pressure 140/85, pulse 85, temperature 98.1 F (36.7 C), resp. rate 18, height _0  (1.676 m), weight 153 lb 9.6 oz (69.7 kg), SpO2 99 %.   Resp: Lungs clear bilaterally Cardio: Regular rate and rhythm GI: Mild tenderness in the right upper abdomen, no hepatomegaly, healing mid abdominal wound with approximately a 5 mm opening.  No erythema surrounding the wound. Vascular: No leg edema    Portacath/PICC-without erythema  Lab Results:  Lab Results  Component Value Date   WBC 4.2 02/18/2020   HGB 10.9 (L) 02/18/2020   HCT 35.1 (L) 02/18/2020   MCV 77.1 (L) 02/18/2020   PLT 213 02/18/2020   NEUTROABS 2.6 02/18/2020    CMP  Lab Results  Component Value Date   NA 140 12/24/2019   K 4.1 12/24/2019   CL 109 12/24/2019   CO2 25 12/24/2019   GLUCOSE 187 (H) 12/24/2019   BUN 8 12/24/2019   CREATININE 0.68 12/24/2019   CALCIUM 8.8 (L) 12/24/2019   PROT 6.8 12/24/2019   ALBUMIN 3.7 12/24/2019   AST 18 12/24/2019   ALT 20 12/24/2019   ALKPHOS 117 12/24/2019   BILITOT 0.4 12/24/2019   GFRNONAA >60 12/24/2019   GFRAA >60 11/05/2019    Lab Results  Component Value Date   CEA1 2.27  12/24/2019     Medications: I have reviewed the patient's current medications.   Assessment/Plan: 1. 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  The tumor returned microsatellite stable with no loss of mismatch repair protein expression   APC mutated. No BRAF, KRAS, or NRAS mutation On Foundation 1 testing   Cycle 1 adjuvant FOLFOX 09/08/2013   Cycle 2 adjuvant FOLFOX 09/24/2013   Cycle 3 adjuvant FOLFOX 10/08/2013.   Cycle 4 adjuvant FOLFOX 10/22/2013.   Cycle 5 adjuvant FOLFOX 11/05/2013. Oxaliplatin held due to thrombocytopenia.  Cycle 6 FOLFOX 11/19/2013.  Cycle 7 FOLFOX 12/03/2013. Oxaliplatin held secondary to thrombocytopenia.  Cycle 8 FOLFOX 12/17/2013.  Cycle 9 FOLFOX 01/04/2014. Oxaliplatin held secondary to neutropenia.   Cycle 10 FOLFOX 01/21/2014. Oxaliplatin held secondary to thrombocytopenia.  Cycle 11 FOLFOX 02/04/2014  Cycle 12 FOLFOX 02/18/2014, oxaliplatin dose reduced secondary tothrombocytopenia  CT abdomen/pelvis 01/30/2014 revealed splenomegaly and no evidence of recurrent colon cancer  CT chest 04/07/2014 with a stable right lower lobe nodule and no evidence for metastatic disease, no nodules seen on the CT 11/26/2014  Markedly elevated CEA 11/24/2014  CT 11/26/2014 revealed a right pelvic mass, splenomegaly, small volume ascites  Right salpingo-oophorectomy 12/28/2014 with the pathology confirming metastatic colon cancer  CTs 03/23/2016-no evidence of recurrent or metastatic disease.  CT 11/26/2016-enlargement of  a fluid density structure the right pelvic sidewall, no other evidence of metastatic disease  CT aspiration right pelvic cyst 12/19/2016.Cytology-BENIGN REACTIVE/REPARATIVE CHANGES.  CTs 06/05/2017-no evidence of metastatic disease, mild cirrhotic changes with splenomegaly  CTs 12/11/2017-recurrent cystic right adnexal  mass, similar to on the CT 11/26/2016, stable mild splenomegaly  CTs 04/23/2018-enlargement of cystic right adnexal mass with mural nodularity, no other evidence of metastatic disease  Cytoreductive surgery/HIPEC with mitomycin by Dr. Clovis Riley at [facility] 08/12/2018-R1 resection achieved. Cytoreduction included omentectomy, LAR, right salpingo-oophorectomy and left colonic gutter/pelvic stripping. Pathology on the rectum showed recurrent/metastatic adenocarcinoma, tumor 2.0 cm, predominantly involving the subserosa and muscularis propria of the colon, proximal and distal margins of resection were negative, vascular invasion present, metastatic carcinoma present in 1 out of 5 lymph nodes; omentum resection with no malignancy seen, no metastatic carcinoma identified in 1 lymph node examined; left gutter stripping positive metastatic adenocarcinoma; right ovary resection positive metastatic adenocarcinoma.  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  CEA 68 02/03/2019  Biopsy liver lesion 02/11/2019-metastatic adenocarcinoma consistent with primary colonic adenocarcinoma  CTs 02/27/2019-multiple liver lesions increased in size, new lesion in the lateral right lobe of the liver. No evidence of metastatic disease in the chest. Redemonstrated moderate left hydronephrosis and proximal hydroureter without discrete lesion or obstructing etiology at the transition point of the mid ureter.  Cycle 1 FOLFIRI/Panitumumab 03/12/2019  Cycle 2 FOLFIRI/Panitumumab 03/26/2019-bolus 5-FU and irinotecan held secondary to neutropenia  Cycle 3 FOLFIRI/Panitumumab 04/09/2019, Udenyca added-not given secondary to seizure/discontinuation of the 5-FU pump  CT abdomen/pelvis at [facility] 05/04/2019-no residual fluid collection at the left abdominal wall abscess, stable moderate left hydronephrosis, multifocal indeterminate liver lesions--liver  lesionssignificantly improved  Cycle 4 FOLFIRI/Panitumumab 05/07/2019, Udenyca  Cycle 5 FOLFIRI/Panitumumab 05/20/2019, Udenyca  Cycle 6 FOLFIRI/Panitumumab 06/18/2019, Udenyca  Cycle 7 FOLFIRI/Panitumumab 07/01/2019 (Irinotecan dose reduced due to thrombocytopenia), Udenyca--Udenyca was not given  Cycle 8 FOLFIRI/Panitumumab 07/15/2019, Udenyca  Cycle 9 FOLFIRI/Panitumumab 07/29/2019, Udenyca  Cycle 10 FOLFIRI/Panitumumab 08/13/2019, Udenyca  CTs at [facility] 08/19/2019-decreased size of the hypodense and hyperdense lesions within segment 2 of the left hepatic lobe. No new lesions identified. Similar presacral soft tissue thickening with adjacent stable alignment in the rectum. Improved but persistent left UPJ obstruction with mild left hydronephrosis. Persistent but decreased size of the wound within the left mid abdomen abdominal wall.  Cycle 11 irinotecan/Panitumumab 08/27/2019, Udenyca  Cycle 12 irinotecan/Panitumumab 10/08/2019, Udenyca  Cycle 13 irinotecan/Panitumumab 11/05/2019, Udenyca  CT abdomen/pelvis 11/19/2019-no new or progressive interval findings.  Stable appearance of the calcified lesion in the anterior left hepatic dome with second tiny low-density lesion in the dome of the lateral segment left liver.  Both lesions are markedly decreased since 02/27/2019.  Stable mild fullness left intrarenal collecting system and renal pelvis.  Similar appearance of abnormal soft tissue in the left paramidline subcutaneous fat with associated skin thickening, this likely correlates to the fistula.  Cycle 14 irinotecan/Panitumumab 11/19/2019, Udenyca  Cycle 15 irinotecan/Panitumumab 12/02/2019, Udenyca  CT abdomen/pelvis at [facility] 02/11/2020- similar to slight decrease in segment 2 hypodense and hyperdense lesions (compared to a CT from July 2020), no new lesions, splenomegaly, no fluid collection or abscess, mild stranding adjacent to the incision with overlying skin thickening 2. Mild  elevation of the CEA beginning January 2016, normal on 05/19/2014   3. History of iron deficiency anemia  4. seizure disorder; seizure 04/09/2019. Brain CT negative. Now on Keppra. 5. history of depression  6. 4  mm right lower lobe nodule on a staging chest CT 09/08/2013 , stable on a CT 04/07/2014 7. Hospitalization 09/24/2013 through 09/26/2013 with fever and abdominal pain.  8. 09/24/2013 urine culture positive for coag negative staph.  9. 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 Splenomegaly noted on a CT scan 01/30/2014,persistent on repeat CTs 12. Colonoscopy 11/17/2018-flexible sigmoidoscopy per rectum with changes of mild diversion colitis. Scope advanced for approximately 25 cm. Most proximal portion had necrotic appearing debris. Colostomy bag insufflated suggesting some type of communication between the pouch and the proximal colon. Introduction of scope into the ostomy found to available directions. 1 toward the distal pouch with similar appearing necrotic debris encountered. The other was about a 30 cm segment of normal-appearing colonic mucosa to the level of the previous right hemicolectomy ileocolonic anastomosis. 43. Port-A-Cath placement interventional radiology 03/05/2019 14. Left leg/foot weakness-brain MRI 09/03/2019 with no evidence of metastatic disease, referred to physical therapy 15. 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  No malignancy.  16. COVID-19 + 09/10/2019 17. Hospitalized with seizure 09/17/2019 through 09/19/2019-MRI brain without evidence of metastatic disease. Seen by neurology. Keppra dose increased. 19.  Rash secondary to Panitumumab   Disposition: [name] appears stable.  The abdominal wound has almost completely healed.  The plan was to resume irinotecan/panitumumab today.  She is being treated for a recent diagnosis of cellulitis surrounding the abdominal incision.  The cellulitis appears to have resolved.  She would like to hold on beginning systemic therapy for another 2 weeks.  She has developed a rash at the dorsum of the hands felt to be related to sun exposure while on doxycycline.  [name] will return for an office visit and irinotecan/panitumumab in 2 weeks.  We encouraged her to obtain a COVID-19 vaccine.    Betsy Coder, MD  02/18/2020  8:51 AM

## 2020-02-19 ENCOUNTER — Telehealth: Payer: Self-pay | Admitting: Oncology

## 2020-02-19 NOTE — Telephone Encounter (Signed)
Scheduled appointments per 1/13 los. Spoke to patient who is aware of appointments date and times.  

## 2020-02-25 ENCOUNTER — Encounter: Payer: Self-pay | Admitting: Oncology

## 2020-02-25 ENCOUNTER — Telehealth: Payer: Self-pay

## 2020-02-25 NOTE — Telephone Encounter (Signed)
Pt called inquired about podiatry appt for ingrown toenail this nurse provides information on two podiatrist

## 2020-02-26 DIAGNOSIS — L03031 Cellulitis of right toe: Secondary | ICD-10-CM | POA: Diagnosis not present

## 2020-02-26 DIAGNOSIS — M79674 Pain in right toe(s): Secondary | ICD-10-CM | POA: Diagnosis not present

## 2020-02-26 DIAGNOSIS — L600 Ingrowing nail: Secondary | ICD-10-CM | POA: Diagnosis not present

## 2020-02-29 ENCOUNTER — Other Ambulatory Visit: Payer: Self-pay | Admitting: Oncology

## 2020-02-29 DIAGNOSIS — C182 Malignant neoplasm of ascending colon: Secondary | ICD-10-CM

## 2020-02-29 NOTE — Telephone Encounter (Signed)
err

## 2020-03-01 ENCOUNTER — Encounter: Payer: Self-pay | Admitting: Oncology

## 2020-03-01 ENCOUNTER — Other Ambulatory Visit: Payer: Self-pay

## 2020-03-01 DIAGNOSIS — C182 Malignant neoplasm of ascending colon: Secondary | ICD-10-CM

## 2020-03-01 MED ORDER — LORAZEPAM 0.5 MG PO TABS: 0.5000 mg | ORAL_TABLET | Freq: Two times a day (BID) | ORAL | 0 refills | Status: DC | PRN

## 2020-03-03 ENCOUNTER — Encounter: Payer: Self-pay | Admitting: Nurse Practitioner

## 2020-03-03 ENCOUNTER — Telehealth: Payer: Self-pay | Admitting: *Deleted

## 2020-03-03 ENCOUNTER — Other Ambulatory Visit: Payer: Self-pay

## 2020-03-03 ENCOUNTER — Inpatient Hospital Stay (HOSPITAL_BASED_OUTPATIENT_CLINIC_OR_DEPARTMENT_OTHER): Payer: BC Managed Care – PPO | Admitting: Nurse Practitioner

## 2020-03-03 ENCOUNTER — Inpatient Hospital Stay: Payer: BC Managed Care – PPO

## 2020-03-03 VITALS — BP 146/80 | HR 85 | Temp 98.1°F | Resp 13 | Ht 66.0 in | Wt 156.8 lb

## 2020-03-03 DIAGNOSIS — C182 Malignant neoplasm of ascending colon: Secondary | ICD-10-CM

## 2020-03-03 DIAGNOSIS — Z95828 Presence of other vascular implants and grafts: Secondary | ICD-10-CM

## 2020-03-03 DIAGNOSIS — C787 Secondary malignant neoplasm of liver and intrahepatic bile duct: Secondary | ICD-10-CM | POA: Diagnosis not present

## 2020-03-03 DIAGNOSIS — Z5112 Encounter for antineoplastic immunotherapy: Secondary | ICD-10-CM | POA: Diagnosis not present

## 2020-03-03 DIAGNOSIS — D6959 Other secondary thrombocytopenia: Secondary | ICD-10-CM | POA: Diagnosis not present

## 2020-03-03 DIAGNOSIS — Z5189 Encounter for other specified aftercare: Secondary | ICD-10-CM | POA: Diagnosis not present

## 2020-03-03 DIAGNOSIS — C7961 Secondary malignant neoplasm of right ovary: Secondary | ICD-10-CM | POA: Diagnosis not present

## 2020-03-03 DIAGNOSIS — Z5111 Encounter for antineoplastic chemotherapy: Secondary | ICD-10-CM | POA: Diagnosis not present

## 2020-03-03 LAB — CEA (IN HOUSE-CHCC): CEA (CHCC-In House): 3.12 ng/mL (ref 0.00–5.00)

## 2020-03-03 LAB — CBC WITH DIFFERENTIAL (CANCER CENTER ONLY)
Abs Immature Granulocytes: 0.01 10*3/uL (ref 0.00–0.07)
Basophils Absolute: 0 10*3/uL (ref 0.0–0.1)
Basophils Relative: 0 %
Eosinophils Absolute: 0.2 10*3/uL (ref 0.0–0.5)
Eosinophils Relative: 5 %
HCT: 34.4 % — ABNORMAL LOW (ref 36.0–46.0)
Hemoglobin: 10.5 g/dL — ABNORMAL LOW (ref 12.0–15.0)
Immature Granulocytes: 0 %
Lymphocytes Relative: 24 %
Lymphs Abs: 0.8 10*3/uL (ref 0.7–4.0)
MCH: 23.6 pg — ABNORMAL LOW (ref 26.0–34.0)
MCHC: 30.5 g/dL (ref 30.0–36.0)
MCV: 77.3 fL — ABNORMAL LOW (ref 80.0–100.0)
Monocytes Absolute: 0.2 10*3/uL (ref 0.1–1.0)
Monocytes Relative: 6 %
Neutro Abs: 2 10*3/uL (ref 1.7–7.7)
Neutrophils Relative %: 65 %
Platelet Count: 128 10*3/uL — ABNORMAL LOW (ref 150–400)
RBC: 4.45 MIL/uL (ref 3.87–5.11)
RDW: 15 % (ref 11.5–15.5)
WBC Count: 3.1 10*3/uL — ABNORMAL LOW (ref 4.0–10.5)
nRBC: 0 % (ref 0.0–0.2)

## 2020-03-03 LAB — CMP (CANCER CENTER ONLY)
ALT: 16 U/L (ref 0–44)
AST: 19 U/L (ref 15–41)
Albumin: 3.6 g/dL (ref 3.5–5.0)
Alkaline Phosphatase: 110 U/L (ref 38–126)
Anion gap: 4 — ABNORMAL LOW (ref 5–15)
BUN: 12 mg/dL (ref 6–20)
CO2: 28 mmol/L (ref 22–32)
Calcium: 9.3 mg/dL (ref 8.9–10.3)
Chloride: 107 mmol/L (ref 98–111)
Creatinine: 0.76 mg/dL (ref 0.44–1.00)
GFR, Estimated: >60 mL/min (ref >60)
Glucose, Bld: 240 mg/dL — ABNORMAL HIGH (ref 70–99)
Potassium: 4 mmol/L (ref 3.5–5.1)
Sodium: 139 mmol/L (ref 135–145)
Total Bilirubin: 0.4 mg/dL (ref 0.3–1.2)
Total Protein: 7 g/dL (ref 6.5–8.1)

## 2020-03-03 LAB — MAGNESIUM: Magnesium: 1.8 mg/dL (ref 1.7–2.4)

## 2020-03-03 MED ORDER — SODIUM CHLORIDE 0.9 % IV SOLN
140.0000 mg/m2 | Freq: Once | INTRAVENOUS | Status: AC
  Administered 2020-03-03: 260 mg via INTRAVENOUS
  Filled 2020-03-03: qty 13

## 2020-03-03 MED ORDER — SODIUM CHLORIDE 0.9 % IV SOLN
6.0000 mg/kg | Freq: Once | INTRAVENOUS | Status: AC
Start: 2020-03-03 — End: 2020-03-03
  Administered 2020-03-03: 400 mg via INTRAVENOUS
  Filled 2020-03-03: qty 20

## 2020-03-03 MED ORDER — HEPARIN SOD (PORK) LOCK FLUSH 100 UNIT/ML IV SOLN
500.0000 [IU] | Freq: Once | INTRAVENOUS | Status: AC | PRN
  Administered 2020-03-03: 500 [IU]
  Filled 2020-03-03: qty 5

## 2020-03-03 MED ORDER — ATROPINE SULFATE 1 MG/ML IJ SOLN
0.5000 mg | Freq: Once | INTRAMUSCULAR | Status: AC | PRN
  Administered 2020-03-03: 0.5 mg via INTRAVENOUS

## 2020-03-03 MED ORDER — SODIUM CHLORIDE 0.9 % IV SOLN
Freq: Once | INTRAVENOUS | Status: AC
Start: 2020-03-03 — End: 2020-03-03
  Filled 2020-03-03: qty 250

## 2020-03-03 MED ORDER — PALONOSETRON HCL INJECTION 0.25 MG/5ML
0.2500 mg | Freq: Once | INTRAVENOUS | Status: AC
  Administered 2020-03-03: 0.25 mg via INTRAVENOUS

## 2020-03-03 MED ORDER — ATROPINE SULFATE 1 MG/ML IJ SOLN
INTRAMUSCULAR | Status: AC
  Filled 2020-03-03: qty 1

## 2020-03-03 MED ORDER — SODIUM CHLORIDE 0.9% FLUSH
10.0000 mL | Freq: Once | INTRAVENOUS | Status: AC
  Administered 2020-03-03: 10 mL
  Filled 2020-03-03: qty 10

## 2020-03-03 MED ORDER — SODIUM CHLORIDE 0.9 % IV SOLN
10.0000 mg | Freq: Once | INTRAVENOUS | Status: AC
  Administered 2020-03-03: 10 mg via INTRAVENOUS
  Filled 2020-03-03: qty 10
  Filled 2020-03-03: qty 1

## 2020-03-03 MED ORDER — SODIUM CHLORIDE 0.9% FLUSH
10.0000 mL | INTRAVENOUS | Status: DC | PRN
  Administered 2020-03-03: 10 mL
  Filled 2020-03-03: qty 10

## 2020-03-03 MED ORDER — PALONOSETRON HCL INJECTION 0.25 MG/5ML
INTRAVENOUS | Status: AC
  Filled 2020-03-03: qty 5

## 2020-03-03 NOTE — Telephone Encounter (Signed)
-----   Message from Owens Shark, NP sent at 03/03/2020  1:28 PM EST ----- Please let her know CEA is stable

## 2020-03-03 NOTE — Progress Notes (Signed)
[facility] OFFICE PROGRESS NOTE   Diagnosis: Colon cancer  INTERVAL HISTORY:   [name] returns as scheduled.  She feels well.  No nausea or vomiting.  No mouth sores.  No diarrhea.  No pain.  No rash.  Abdominal wound has healed.  She reports having icing and an oatmeal cookie earlier today.  Objective:  Vital signs in last 24 hours:  Blood pressure (!) 146/80, pulse 85, temperature 98.1 F (36.7 C), resp. rate 13, height 5' 6"  (1.676 m), weight 156 lb 12.8 oz (71.1 kg), SpO2 99 %.    HEENT: No thrush or ulcers. Resp: Lungs clear bilaterally. Cardio: Regular rate and rhythm. GI: Abdomen soft and nontender.  No hepatomegaly.  Healed mid abdominal wound.  No surrounding erythema. Vascular: No leg edema. Skin: No rash. Port-A-Cath without erythema   Lab Results:  Lab Results  Component Value Date   WBC 3.1 (L) 03/03/2020   HGB 10.5 (L) 03/03/2020   HCT 34.4 (L) 03/03/2020   MCV 77.3 (L) 03/03/2020   PLT 128 (L) 03/03/2020   NEUTROABS 2.0 03/03/2020    Imaging:  No results found.  Medications: I have reviewed the patient's current medications.  Assessment/Plan: 1. 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  The tumor returned microsatellite stable with no loss of mismatch repair protein expression   APC mutated. No BRAF, KRAS, or NRAS mutation On Foundation 1 testing   Cycle 1 adjuvant FOLFOX 09/08/2013   Cycle 2 adjuvant FOLFOX 09/24/2013   Cycle 3 adjuvant FOLFOX 10/08/2013.   Cycle 4 adjuvant FOLFOX 10/22/2013.   Cycle 5 adjuvant FOLFOX 11/05/2013. Oxaliplatin held due to thrombocytopenia.  Cycle 6 FOLFOX 11/19/2013.  Cycle 7 FOLFOX 12/03/2013. Oxaliplatin held secondary to thrombocytopenia.  Cycle 8 FOLFOX 12/17/2013.  Cycle 9 FOLFOX 01/04/2014. Oxaliplatin held secondary to neutropenia.   Cycle 10 FOLFOX 01/21/2014.  Oxaliplatin held secondary to thrombocytopenia.  Cycle 11 FOLFOX 02/04/2014  Cycle 12 FOLFOX 02/18/2014, oxaliplatin dose reduced secondary tothrombocytopenia  CT abdomen/pelvis 01/30/2014 revealed splenomegaly and no evidence of recurrent colon cancer  CT chest 04/07/2014 with a stable right lower lobe nodule and no evidence for metastatic disease, no nodules seen on the CT 11/26/2014  Markedly elevated CEA 11/24/2014  CT 11/26/2014 revealed a right pelvic mass, splenomegaly, small volume ascites  Right salpingo-oophorectomy 12/28/2014 with the pathology confirming metastatic colon cancer  CTs 03/23/2016-no evidence of recurrent or metastatic disease.  CT 11/26/2016-enlargement of a fluid density structure the right pelvic sidewall, no other evidence of metastatic disease  CT aspiration right pelvic cyst 12/19/2016.Cytology-BENIGN REACTIVE/REPARATIVE CHANGES.  CTs 06/05/2017-no evidence of metastatic disease, mild cirrhotic changes with splenomegaly  CTs 12/11/2017-recurrent cystic right adnexal mass, similar to on the CT 11/26/2016, stable mild splenomegaly  CTs 04/23/2018-enlargement of cystic right adnexal mass with mural nodularity, no other evidence of metastatic disease  Cytoreductive surgery/HIPEC with mitomycin by Dr. Clovis Riley at [facility] 08/12/2018-R1 resection achieved. Cytoreduction included omentectomy, LAR, right salpingo-oophorectomy and left colonic gutter/pelvic stripping. Pathology on the rectum showed recurrent/metastatic adenocarcinoma, tumor 2.0 cm, predominantly involving the subserosa and muscularis propria of the colon, proximal and distal margins of resection were negative, vascular invasion present, metastatic carcinoma present in 1 out of 5 lymph nodes; omentum resection with no malignancy seen, no metastatic carcinoma identified in 1 lymph node examined; left gutter stripping positive metastatic adenocarcinoma; right ovary resection positive metastatic  adenocarcinoma.  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  CEA 68 02/03/2019  Biopsy liver lesion 02/11/2019-metastatic adenocarcinoma consistent with primary colonic adenocarcinoma  CTs 02/27/2019-multiple liver lesions increased in size, new lesion in the lateral right lobe of the liver. No evidence of metastatic disease in the chest. Redemonstrated moderate left hydronephrosis and proximal hydroureter without discrete lesion or obstructing etiology at the transition point of the mid ureter.  Cycle 1 FOLFIRI/Panitumumab 03/12/2019  Cycle 2 FOLFIRI/Panitumumab 03/26/2019-bolus 5-FU and irinotecan held secondary to neutropenia  Cycle 3 FOLFIRI/Panitumumab 04/09/2019, Udenyca added-not given secondary to seizure/discontinuation of the 5-FU pump  CT abdomen/pelvis at [facility] 05/04/2019-no residual fluid collection at the left abdominal wall abscess, stable moderate left hydronephrosis, multifocal indeterminate liver lesions--liver lesionssignificantly improved  Cycle 4 FOLFIRI/Panitumumab 05/07/2019, Udenyca  Cycle 5 FOLFIRI/Panitumumab 05/20/2019, Udenyca  Cycle 6 FOLFIRI/Panitumumab 06/18/2019, Udenyca  Cycle 7 FOLFIRI/Panitumumab 07/01/2019 (Irinotecan dose reduced due to thrombocytopenia), Udenyca--Udenyca was not given  Cycle 8 FOLFIRI/Panitumumab 07/15/2019, Udenyca  Cycle 9 FOLFIRI/Panitumumab 07/29/2019, Udenyca  Cycle 10 FOLFIRI/Panitumumab 08/13/2019, Udenyca  CTs at [facility] 08/19/2019-decreased size of the hypodense and hyperdense lesions within segment 2 of the left hepatic lobe. No new lesions identified. Similar presacral soft tissue thickening with adjacent stable alignment in the rectum. Improved but persistent left UPJ obstruction with mild left hydronephrosis. Persistent but decreased size of the wound within the left mid abdomen abdominal wall.  Cycle 11 irinotecan/Panitumumab 08/27/2019,  Udenyca  Cycle 12 irinotecan/Panitumumab 10/08/2019, Udenyca  Cycle 13 irinotecan/Panitumumab 11/05/2019, Udenyca  CT abdomen/pelvis 11/19/2019-no new or progressive interval findings. Stable appearance of the calcified lesion in the anterior left hepatic dome with second tiny low-density lesion in the dome of the lateral segment left liver. Both lesions are markedly decreased since 02/27/2019. Stable mild fullness left intrarenal collecting system and renal pelvis. Similar appearance of abnormal soft tissue in the left paramidline subcutaneous fat with associated skin thickening, this likely correlates to the fistula.  Cycle 14 irinotecan/Panitumumab 11/19/2019, Udenyca  Cycle 15 irinotecan/Panitumumab 12/02/2019, Udenyca  CT abdomen/pelvis at [facility] 02/11/2020- similar to slight decrease in segment 2 hypodense and hyperdense lesions (compared to a CT from July 2020), no new lesions, splenomegaly, no fluid collection or abscess, mild stranding adjacent to the incision with overlying skin thickening  Cycle 16 irinotecan/Panitumumab 03/03/2020, Udenyca 2. Mild elevation of the CEA beginning January 2016, normal on 05/19/2014   3. History of iron deficiency anemia  4. seizure disorder; seizure 04/09/2019. Brain CT negative. Now on Keppra. 5. history of depression  6. 4 mm right lower lobe nodule on a staging chest CT 09/08/2013 , stable on a CT 04/07/2014 7. Hospitalization 09/24/2013 through 09/26/2013 with fever and abdominal pain.  8. 09/24/2013 urine culture positive for coag negative staph.  9. 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 Splenomegaly noted on a CT scan 01/30/2014,persistent on repeat CTs 12. Colonoscopy 11/17/2018-flexible sigmoidoscopy per rectum with changes of mild diversion colitis. Scope advanced for approximately 25 cm. Most proximal portion had necrotic appearing  debris. Colostomy bag insufflated suggesting some type of communication between the pouch and the proximal colon. Introduction of scope into the ostomy found to available directions. 1 toward the distal pouch with similar appearing necrotic debris encountered. The other was about a 30 cm segment of normal-appearing colonic mucosa to the level of the previous right hemicolectomy ileocolonic anastomosis. 55. Port-A-Cath placement interventional radiology 03/05/2019 14. Left leg/foot weakness-brain MRI 09/03/2019 with no evidence of metastatic disease, referred to physical therapy  15. 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  No malignancy.  16. COVID-19 + 09/10/2019 17. Hospitalized with seizure 09/17/2019 through 09/19/2019-MRI brain without evidence of metastatic disease. Seen by neurology. Keppra dose increased. 19.  Rash secondary to Panitumumab   Disposition: Ms. Pe appears stable.  She is scheduled to resume treatment with irinotecan/Panitumumab today.  She feels well and the abdominal wound has healed.  Plan to proceed with treatment as scheduled.  We reviewed the CBC and chemistry panel from today.  Labs adequate to proceed with treatment.  She will receive Udenyca on 03/05/2020.  She has mild thrombocytopenia.  She will contact the office with any bleeding.  We discussed the elevated glucose and provided her with information on a no concentrated sweet diet.  She will return for lab,  follow-up, irinotecan/Panitumumab in 2 weeks.  She will contact the office in the interim with any problems.    Ned Card ANP/GNP-BC   03/03/2020  10:47 AM

## 2020-03-03 NOTE — Telephone Encounter (Signed)
Per Marlynn Perking called pt with message below. Pt verbalized understanding.

## 2020-03-03 NOTE — Patient Instructions (Signed)
[facility] Discharge Instructions for Patients Receiving Chemotherapy  Today you received the following immunotherapy agent: Panitumumab (Vectibix) and chemotherapy agent: Irinotecan  To help prevent nausea and vomiting after your treatment, we encourage you to take your nausea medication as directed by your MD.   If you develop nausea and vomiting that is not controlled by your nausea medication, call the clinic.   BELOW ARE SYMPTOMS THAT SHOULD BE REPORTED IMMEDIATELY:  *FEVER GREATER THAN 100.5 F  *CHILLS WITH OR WITHOUT FEVER  NAUSEA AND VOMITING THAT IS NOT CONTROLLED WITH YOUR NAUSEA MEDICATION  *UNUSUAL SHORTNESS OF BREATH  *UNUSUAL BRUISING OR BLEEDING  TENDERNESS IN MOUTH AND THROAT WITH OR WITHOUT PRESENCE OF ULCERS  *URINARY PROBLEMS  *BOWEL PROBLEMS  UNUSUAL RASH Items with * indicate a potential emergency and should be followed up as soon as possible.  Feel free to call the clinic should you have any questions or concerns. The clinic phone number is [phone].  Please show the CHEMO ALERT CARD at check-in to the Emergency Department and triage nurse.   

## 2020-03-04 ENCOUNTER — Telehealth: Payer: Self-pay | Admitting: Nurse Practitioner

## 2020-03-04 NOTE — Telephone Encounter (Signed)
Scheduled appointments per 1/27 los. Called patient, no answer. Left message with appointments dates and times.  

## 2020-03-05 ENCOUNTER — Other Ambulatory Visit: Payer: Self-pay

## 2020-03-05 ENCOUNTER — Inpatient Hospital Stay: Payer: BC Managed Care – PPO

## 2020-03-05 VITALS — BP 126/84 | HR 83 | Temp 98.2°F | Resp 17

## 2020-03-05 DIAGNOSIS — Z5112 Encounter for antineoplastic immunotherapy: Secondary | ICD-10-CM | POA: Diagnosis not present

## 2020-03-05 DIAGNOSIS — C182 Malignant neoplasm of ascending colon: Secondary | ICD-10-CM

## 2020-03-05 DIAGNOSIS — Z5111 Encounter for antineoplastic chemotherapy: Secondary | ICD-10-CM | POA: Diagnosis not present

## 2020-03-05 DIAGNOSIS — Z5189 Encounter for other specified aftercare: Secondary | ICD-10-CM | POA: Diagnosis not present

## 2020-03-05 DIAGNOSIS — C7961 Secondary malignant neoplasm of right ovary: Secondary | ICD-10-CM | POA: Diagnosis not present

## 2020-03-05 DIAGNOSIS — D6959 Other secondary thrombocytopenia: Secondary | ICD-10-CM | POA: Diagnosis not present

## 2020-03-05 DIAGNOSIS — C787 Secondary malignant neoplasm of liver and intrahepatic bile duct: Secondary | ICD-10-CM | POA: Diagnosis not present

## 2020-03-05 MED ORDER — PEGFILGRASTIM-CBQV 6 MG/0.6ML ~~LOC~~ SOSY
6.0000 mg | PREFILLED_SYRINGE | Freq: Once | SUBCUTANEOUS | Status: AC
  Administered 2020-03-05: 6 mg via SUBCUTANEOUS

## 2020-03-05 NOTE — Patient Instructions (Signed)
Pegfilgrastim injection What is this medicine? PEGFILGRASTIM (PEG fil gra stim) is a long-acting granulocyte colony-stimulating factor that stimulates the growth of neutrophils, a type of white blood cell important in the body's fight against infection. It is used to reduce the incidence of fever and infection in patients with certain types of cancer who are receiving chemotherapy that affects the bone marrow, and to increase survival after being exposed to high doses of radiation. This medicine may be used for other purposes; ask your health care provider or pharmacist if you have questions. COMMON BRAND NAME(S): Fulphila, Neulasta, Nyvepria, UDENYCA, Ziextenzo What should I tell my health care provider before I take this medicine? They need to know if you have any of these conditions:  kidney disease  latex allergy  ongoing radiation therapy  sickle cell disease  skin reactions to acrylic adhesives (On-Body Injector only)  an unusual or allergic reaction to pegfilgrastim, filgrastim, other medicines, foods, dyes, or preservatives  pregnant or trying to get pregnant  breast-feeding How should I use this medicine? This medicine is for injection under the skin. If you get this medicine at home, you will be taught how to prepare and give the pre-filled syringe or how to use the On-body Injector. Refer to the patient Instructions for Use for detailed instructions. Use exactly as directed. Tell your healthcare provider immediately if you suspect that the On-body Injector may not have performed as intended or if you suspect the use of the On-body Injector resulted in a missed or partial dose. It is important that you put your used needles and syringes in a special sharps container. Do not put them in a trash can. If you do not have a sharps container, call your pharmacist or healthcare provider to get one. Talk to your pediatrician regarding the use of this medicine in children. While this drug  may be prescribed for selected conditions, precautions do apply. Overdosage: If you think you have taken too much of this medicine contact a poison control center or emergency room at once. NOTE: This medicine is only for you. Do not share this medicine with others. What if I miss a dose? It is important not to miss your dose. Call your doctor or health care professional if you miss your dose. If you miss a dose due to an On-body Injector failure or leakage, a new dose should be administered as soon as possible using a single prefilled syringe for manual use. What may interact with this medicine? Interactions have not been studied. This list may not describe all possible interactions. Give your health care provider a list of all the medicines, herbs, non-prescription drugs, or dietary supplements you use. Also tell them if you smoke, drink alcohol, or use illegal drugs. Some items may interact with your medicine. What should I watch for while using this medicine? Your condition will be monitored carefully while you are receiving this medicine. You may need blood work done while you are taking this medicine. Talk to your health care provider about your risk of cancer. You may be more at risk for certain types of cancer if you take this medicine. If you are going to need a MRI, CT scan, or other procedure, tell your doctor that you are using this medicine (On-Body Injector only). What side effects may I notice from receiving this medicine? Side effects that you should report to your doctor or health care professional as soon as possible:  allergic reactions (skin rash, itching or hives, swelling of   the face, lips, or tongue)  back pain  dizziness  fever  pain, redness, or irritation at site where injected  pinpoint red spots on the skin  red or dark-Calix urine  shortness of breath or breathing problems  stomach or side pain, or pain at the shoulder  swelling  tiredness  trouble  passing urine or change in the amount of urine  unusual bruising or bleeding Side effects that usually do not require medical attention (report to your doctor or health care professional if they continue or are bothersome):  bone pain  muscle pain This list may not describe all possible side effects. Call your doctor for medical advice about side effects. You may report side effects to FDA at 1-800-FDA-1088. Where should I keep my medicine? Keep out of the reach of children. If you are using this medicine at home, you will be instructed on how to store it. Throw away any unused medicine after the expiration date on the label. NOTE: This sheet is a summary. It may not cover all possible information. If you have questions about this medicine, talk to your doctor, pharmacist, or health care provider.  2021 Elsevier/Gold Standard (2019-02-13 13:20:51)  

## 2020-03-09 ENCOUNTER — Telehealth: Payer: Self-pay

## 2020-03-09 ENCOUNTER — Encounter: Payer: Self-pay | Admitting: Oncology

## 2020-03-09 NOTE — Telephone Encounter (Signed)
error 

## 2020-03-09 NOTE — Telephone Encounter (Signed)
She already has valid PA on file per PA claim. This is the price it will now cost even with the PA Approval because medication is no longer covered for this year. She does have a paid claim on file for 03/07/20 so I'm guessing patient has picked up the medication for this month.

## 2020-03-11 ENCOUNTER — Telehealth: Payer: Self-pay

## 2020-03-11 DIAGNOSIS — G40309 Generalized idiopathic epilepsy and epileptic syndromes, not intractable, without status epilepticus: Secondary | ICD-10-CM

## 2020-03-11 MED ORDER — LACOSAMIDE 200 MG PO TABS: 200.0000 mg | ORAL_TABLET | Freq: Two times a day (BID) | ORAL | 4 refills | Status: DC

## 2020-03-12 ENCOUNTER — Other Ambulatory Visit: Payer: Self-pay | Admitting: Oncology

## 2020-03-16 DIAGNOSIS — L600 Ingrowing nail: Secondary | ICD-10-CM | POA: Diagnosis not present

## 2020-03-17 ENCOUNTER — Inpatient Hospital Stay (HOSPITAL_BASED_OUTPATIENT_CLINIC_OR_DEPARTMENT_OTHER): Payer: BC Managed Care – PPO | Admitting: Nurse Practitioner

## 2020-03-17 ENCOUNTER — Inpatient Hospital Stay: Payer: BC Managed Care – PPO

## 2020-03-17 ENCOUNTER — Encounter: Payer: Self-pay | Admitting: Nurse Practitioner

## 2020-03-17 ENCOUNTER — Other Ambulatory Visit: Payer: Self-pay

## 2020-03-17 ENCOUNTER — Inpatient Hospital Stay: Payer: BC Managed Care – PPO | Attending: Oncology

## 2020-03-17 VITALS — BP 122/74 | HR 79 | Temp 97.7°F | Resp 20 | Ht 66.0 in | Wt 157.6 lb

## 2020-03-17 DIAGNOSIS — R21 Rash and other nonspecific skin eruption: Secondary | ICD-10-CM | POA: Insufficient documentation

## 2020-03-17 DIAGNOSIS — Z5111 Encounter for antineoplastic chemotherapy: Secondary | ICD-10-CM | POA: Diagnosis not present

## 2020-03-17 DIAGNOSIS — C7961 Secondary malignant neoplasm of right ovary: Secondary | ICD-10-CM | POA: Diagnosis not present

## 2020-03-17 DIAGNOSIS — D509 Iron deficiency anemia, unspecified: Secondary | ICD-10-CM | POA: Diagnosis not present

## 2020-03-17 DIAGNOSIS — C787 Secondary malignant neoplasm of liver and intrahepatic bile duct: Secondary | ICD-10-CM | POA: Insufficient documentation

## 2020-03-17 DIAGNOSIS — Z5189 Encounter for other specified aftercare: Secondary | ICD-10-CM | POA: Diagnosis not present

## 2020-03-17 DIAGNOSIS — Z5112 Encounter for antineoplastic immunotherapy: Secondary | ICD-10-CM | POA: Diagnosis not present

## 2020-03-17 DIAGNOSIS — C182 Malignant neoplasm of ascending colon: Secondary | ICD-10-CM

## 2020-03-17 DIAGNOSIS — D6959 Other secondary thrombocytopenia: Secondary | ICD-10-CM | POA: Diagnosis not present

## 2020-03-17 LAB — CBC WITH DIFFERENTIAL (CANCER CENTER ONLY)
Abs Immature Granulocytes: 0.07 10*3/uL (ref 0.00–0.07)
Basophils Absolute: 0 10*3/uL (ref 0.0–0.1)
Basophils Relative: 0 %
Eosinophils Absolute: 0.2 10*3/uL (ref 0.0–0.5)
Eosinophils Relative: 3 %
HCT: 36.3 % (ref 36.0–46.0)
Hemoglobin: 11.1 g/dL — ABNORMAL LOW (ref 12.0–15.0)
Immature Granulocytes: 1 %
Lymphocytes Relative: 14 %
Lymphs Abs: 0.9 10*3/uL (ref 0.7–4.0)
MCH: 23.8 pg — ABNORMAL LOW (ref 26.0–34.0)
MCHC: 30.6 g/dL (ref 30.0–36.0)
MCV: 77.9 fL — ABNORMAL LOW (ref 80.0–100.0)
Monocytes Absolute: 0.2 10*3/uL (ref 0.1–1.0)
Monocytes Relative: 4 %
Neutro Abs: 4.8 10*3/uL (ref 1.7–7.7)
Neutrophils Relative %: 78 %
Platelet Count: 114 10*3/uL — ABNORMAL LOW (ref 150–400)
RBC: 4.66 MIL/uL (ref 3.87–5.11)
RDW: 16.1 % — ABNORMAL HIGH (ref 11.5–15.5)
WBC Count: 6.3 10*3/uL (ref 4.0–10.5)
nRBC: 0 % (ref 0.0–0.2)

## 2020-03-17 LAB — CMP (CANCER CENTER ONLY)
ALT: 19 U/L (ref 0–44)
AST: 15 U/L (ref 15–41)
Albumin: 3.8 g/dL (ref 3.5–5.0)
Alkaline Phosphatase: 131 U/L — ABNORMAL HIGH (ref 38–126)
Anion gap: 9 (ref 5–15)
BUN: 11 mg/dL (ref 6–20)
CO2: 24 mmol/L (ref 22–32)
Calcium: 8.9 mg/dL (ref 8.9–10.3)
Chloride: 107 mmol/L (ref 98–111)
Creatinine: 0.77 mg/dL (ref 0.44–1.00)
GFR, Estimated: >60 mL/min (ref >60)
Glucose, Bld: 248 mg/dL — ABNORMAL HIGH (ref 70–99)
Potassium: 3.7 mmol/L (ref 3.5–5.1)
Sodium: 140 mmol/L (ref 135–145)
Total Bilirubin: 0.4 mg/dL (ref 0.3–1.2)
Total Protein: 7 g/dL (ref 6.5–8.1)

## 2020-03-17 LAB — MAGNESIUM: Magnesium: 1.7 mg/dL (ref 1.7–2.4)

## 2020-03-17 LAB — CEA (IN HOUSE-CHCC): CEA (CHCC-In House): 4.82 ng/mL (ref 0.00–5.00)

## 2020-03-17 MED ORDER — ATROPINE SULFATE 1 MG/ML IJ SOLN
0.5000 mg | Freq: Once | INTRAMUSCULAR | Status: AC
  Administered 2020-03-17: 0.5 mg via INTRAVENOUS

## 2020-03-17 MED ORDER — SODIUM CHLORIDE 0.9 % IV SOLN
6.0000 mg/kg | Freq: Once | INTRAVENOUS | Status: AC
  Administered 2020-03-17: 400 mg via INTRAVENOUS
  Filled 2020-03-17: qty 20

## 2020-03-17 MED ORDER — PALONOSETRON HCL INJECTION 0.25 MG/5ML
0.2500 mg | Freq: Once | INTRAVENOUS | Status: AC
  Administered 2020-03-17: 0.25 mg via INTRAVENOUS

## 2020-03-17 MED ORDER — SODIUM CHLORIDE 0.9 % IV SOLN
Freq: Once | INTRAVENOUS | Status: AC
  Filled 2020-03-17: qty 250

## 2020-03-17 MED ORDER — SODIUM CHLORIDE 0.9% FLUSH
10.0000 mL | INTRAVENOUS | Status: DC | PRN
  Administered 2020-03-17: 10 mL
  Filled 2020-03-17: qty 10

## 2020-03-17 MED ORDER — ATROPINE SULFATE 1 MG/ML IJ SOLN
INTRAMUSCULAR | Status: AC
  Filled 2020-03-17: qty 1

## 2020-03-17 MED ORDER — SODIUM CHLORIDE 0.9 % IV SOLN
10.0000 mg | Freq: Once | INTRAVENOUS | Status: AC
  Administered 2020-03-17: 10 mg via INTRAVENOUS
  Filled 2020-03-17: qty 10

## 2020-03-17 MED ORDER — SODIUM CHLORIDE 0.9 % IV SOLN
140.0000 mg/m2 | Freq: Once | INTRAVENOUS | Status: AC
  Administered 2020-03-17: 260 mg via INTRAVENOUS
  Filled 2020-03-17: qty 13

## 2020-03-17 MED ORDER — PALONOSETRON HCL INJECTION 0.25 MG/5ML
INTRAVENOUS | Status: AC
  Filled 2020-03-17: qty 5

## 2020-03-17 MED ORDER — HEPARIN SOD (PORK) LOCK FLUSH 100 UNIT/ML IV SOLN
500.0000 [IU] | Freq: Once | INTRAVENOUS | Status: AC | PRN
  Administered 2020-03-17: 500 [IU]
  Filled 2020-03-17: qty 5

## 2020-03-17 NOTE — Patient Instructions (Signed)
[facility] Discharge Instructions for Patients Receiving Chemotherapy  Today you received the following immunotherapy agent: Panitumumab (Vectibix) and chemotherapy agent: Irinotecan  To help prevent nausea and vomiting after your treatment, we encourage you to take your nausea medication as directed by your MD.   If you develop nausea and vomiting that is not controlled by your nausea medication, call the clinic.   BELOW ARE SYMPTOMS THAT SHOULD BE REPORTED IMMEDIATELY:  *FEVER GREATER THAN 100.5 F  *CHILLS WITH OR WITHOUT FEVER  NAUSEA AND VOMITING THAT IS NOT CONTROLLED WITH YOUR NAUSEA MEDICATION  *UNUSUAL SHORTNESS OF BREATH  *UNUSUAL BRUISING OR BLEEDING  TENDERNESS IN MOUTH AND THROAT WITH OR WITHOUT PRESENCE OF ULCERS  *URINARY PROBLEMS  *BOWEL PROBLEMS  UNUSUAL RASH Items with * indicate a potential emergency and should be followed up as soon as possible.  Feel free to call the clinic should you have any questions or concerns. The clinic phone number is [phone].  Please show the CHEMO ALERT CARD at check-in to the Emergency Department and triage nurse.   

## 2020-03-17 NOTE — Progress Notes (Signed)
[name] OFFICE PROGRESS NOTE   Diagnosis: Colon cancer  INTERVAL HISTORY:   [name] returns as scheduled.  She resumed treatment with irinotecan/Panitumumab 03/03/2020.  She denies nausea/vomiting.  Periodic loose stools.  No mouth sores.  Mild rash on her chin.  She has noticed skin changes mainly involving the fingers.  Objective:  Vital signs in last 24 hours:  Blood pressure 122/74, pulse 79, temperature 97.7 F (36.5 C), temperature source Tympanic, resp. rate 20, height 5' 6"  (1.676 m), weight 157 lb 9.6 oz (71.5 kg), SpO2 100 %.    HEENT: No thrush or ulcers. Resp: Lungs clear bilaterally. Cardio: Regular rate and rhythm. GI: Abdomen soft and nontender.  No hepatomegaly. Vascular: No leg edema.  Skin: Faint rash at the chin.  Several linear breaks in the skin at the fingertips. Port-A-Cath without erythema.   Lab Results:  Lab Results  Component Value Date   WBC 6.3 03/17/2020   HGB 11.1 (L) 03/17/2020   HCT 36.3 03/17/2020   MCV 77.9 (L) 03/17/2020   PLT 114 (L) 03/17/2020   NEUTROABS 4.8 03/17/2020    Imaging:  No results found.  Medications: I have reviewed the patient's current medications.  Assessment/Plan: 1. 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  The tumor returned microsatellite stable with no loss of mismatch repair protein expression   APC mutated. No BRAF, KRAS, or NRAS mutation On Foundation 1 testing   Cycle 1 adjuvant FOLFOX 09/08/2013   Cycle 2 adjuvant FOLFOX 09/24/2013   Cycle 3 adjuvant FOLFOX 10/08/2013.   Cycle 4 adjuvant FOLFOX 10/22/2013.   Cycle 5 adjuvant FOLFOX 11/05/2013. Oxaliplatin held due to thrombocytopenia.  Cycle 6 FOLFOX 11/19/2013.  Cycle 7 FOLFOX 12/03/2013. Oxaliplatin held secondary to thrombocytopenia.  Cycle 8 FOLFOX 12/17/2013.  Cycle 9 FOLFOX 01/04/2014. Oxaliplatin held  secondary to neutropenia.   Cycle 10 FOLFOX 01/21/2014. Oxaliplatin held secondary to thrombocytopenia.  Cycle 11 FOLFOX 02/04/2014  Cycle 12 FOLFOX 02/18/2014, oxaliplatin dose reduced secondary tothrombocytopenia  CT abdomen/pelvis 01/30/2014 revealed splenomegaly and no evidence of recurrent colon cancer  CT chest 04/07/2014 with a stable right lower lobe nodule and no evidence for metastatic disease, no nodules seen on the CT 11/26/2014  Markedly elevated CEA 11/24/2014  CT 11/26/2014 revealed a right pelvic mass, splenomegaly, small volume ascites  Right salpingo-oophorectomy 12/28/2014 with the pathology confirming metastatic colon cancer  CTs 03/23/2016-no evidence of recurrent or metastatic disease.  CT 11/26/2016-enlargement of a fluid density structure the right pelvic sidewall, no other evidence of metastatic disease  CT aspiration right pelvic cyst 12/19/2016.Cytology-BENIGN REACTIVE/REPARATIVE CHANGES.  CTs 06/05/2017-no evidence of metastatic disease, mild cirrhotic changes with splenomegaly  CTs 12/11/2017-recurrent cystic right adnexal mass, similar to on the CT 11/26/2016, stable mild splenomegaly  CTs 04/23/2018-enlargement of cystic right adnexal mass with mural nodularity, no other evidence of metastatic disease  Cytoreductive surgery/HIPEC with mitomycin by Dr. Clovis Riley at [facility] 08/12/2018-R1 resection achieved. Cytoreduction included omentectomy, LAR, right salpingo-oophorectomy and left colonic gutter/pelvic stripping. Pathology on the rectum showed recurrent/metastatic adenocarcinoma, tumor 2.0 cm, predominantly involving the subserosa and muscularis propria of the colon, proximal and distal margins of resection were negative, vascular invasion present, metastatic carcinoma present in 1 out of 5 lymph nodes; omentum resection with no malignancy seen, no metastatic carcinoma identified in 1 lymph node examined; left gutter stripping positive metastatic  adenocarcinoma; right ovary resection positive metastatic adenocarcinoma.  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  CEA 68 02/03/2019  Biopsy liver lesion 02/11/2019-metastatic adenocarcinoma consistent with primary colonic adenocarcinoma  CTs 02/27/2019-multiple liver lesions increased in size, new lesion in the lateral right lobe of the liver. No evidence of metastatic disease in the chest. Redemonstrated moderate left hydronephrosis and proximal hydroureter without discrete lesion or obstructing etiology at the transition point of the mid ureter.  Cycle 1 FOLFIRI/Panitumumab 03/12/2019  Cycle 2 FOLFIRI/Panitumumab 03/26/2019-bolus 5-FU and irinotecan held secondary to neutropenia  Cycle 3 FOLFIRI/Panitumumab 04/09/2019, Udenyca added-not given secondary to seizure/discontinuation of the 5-FU pump  CT abdomen/pelvis at [facility] 05/04/2019-no residual fluid collection at the left abdominal wall abscess, stable moderate left hydronephrosis, multifocal indeterminate liver lesions--liver lesionssignificantly improved  Cycle 4 FOLFIRI/Panitumumab 05/07/2019, Udenyca  Cycle 5 FOLFIRI/Panitumumab 05/20/2019, Udenyca  Cycle 6 FOLFIRI/Panitumumab 06/18/2019, Udenyca  Cycle 7 FOLFIRI/Panitumumab 07/01/2019 (Irinotecan dose reduced due to thrombocytopenia), Udenyca--Udenyca was not given  Cycle 8 FOLFIRI/Panitumumab 07/15/2019, Udenyca  Cycle 9 FOLFIRI/Panitumumab 07/29/2019, Udenyca  Cycle 10 FOLFIRI/Panitumumab 08/13/2019, Udenyca  CTs at [facility] 08/19/2019-decreased size of the hypodense and hyperdense lesions within segment 2 of the left hepatic lobe. No new lesions identified. Similar presacral soft tissue thickening with adjacent stable alignment in the rectum. Improved but persistent left UPJ obstruction with mild left hydronephrosis. Persistent but decreased size of the wound within the left mid abdomen abdominal  wall.  Cycle 11 irinotecan/Panitumumab 08/27/2019, Udenyca  Cycle 12 irinotecan/Panitumumab 10/08/2019, Udenyca  Cycle 13 irinotecan/Panitumumab 11/05/2019, Udenyca  CT abdomen/pelvis 11/19/2019-no new or progressive interval findings. Stable appearance of the calcified lesion in the anterior left hepatic dome with second tiny low-density lesion in the dome of the lateral segment left liver. Both lesions are markedly decreased since 02/27/2019. Stable mild fullness left intrarenal collecting system and renal pelvis. Similar appearance of abnormal soft tissue in the left paramidline subcutaneous fat with associated skin thickening, this likely correlates to the fistula.  Cycle 14 irinotecan/Panitumumab 11/19/2019, Udenyca  Cycle 15 irinotecan/Panitumumab 12/02/2019, Udenyca  CT abdomen/pelvis at [facility] 02/11/2020-similar to slight decrease in segment 2 hypodense and hyperdense lesions (compared to a CT from July 2020), no new lesions, splenomegaly, no fluid collection or abscess, mild stranding adjacent to the incision with overlying skin thickening  Cycle 16 irinotecan/Panitumumab 03/03/2020, Udenyca  Cycle 17 irinotecan/Panitumumab 03/17/2020, Udenyca 2. Mild elevation of the CEA beginning January 2016, normal on 05/19/2014   3. History of iron deficiency anemia  4. seizure disorder; seizure 04/09/2019. Brain CT negative. Now on Keppra. 5. history of depression  6. 4 mm right lower lobe nodule on a staging chest CT 09/08/2013 , stable on a CT 04/07/2014 7. Hospitalization 09/24/2013 through 09/26/2013 with fever and abdominal pain.  8. 09/24/2013 urine culture positive for coag negative staph.  9. 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 Splenomegaly noted on a CT scan 01/30/2014,persistent on repeat CTs 12. Colonoscopy 11/17/2018-flexible sigmoidoscopy per rectum with changes of mild  diversion colitis. Scope advanced for approximately 25 cm. Most proximal portion had necrotic appearing debris. Colostomy bag insufflated suggesting some type of communication between the pouch and the proximal colon. Introduction of scope into the ostomy found to available directions. 1 toward the distal pouch with similar appearing necrotic debris encountered. The other was about a 30 cm segment of normal-appearing colonic mucosa to the level of the previous right hemicolectomy ileocolonic anastomosis. 36. Port-A-Cath placement interventional radiology 03/05/2019 14. Left leg/foot weakness-brain MRI 09/03/2019 with no evidence  of metastatic disease, referred to physical therapy 15. 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 No malignancy.  16. COVID-19 + 09/10/2019 17. Hospitalized with seizure 09/17/2019 through 09/19/2019-MRI brain without evidence of metastatic disease. Seen by neurology. Keppra dose increased. 19. Rash secondary to Panitumumab    Disposition: [name] appears stable.  She resumed treatment with Irinotecan/Panitumumab 2 weeks ago.  She tolerated well.  Plan to proceed with irinotecan/Panitumumab today as scheduled.  Plan for a 3-week treatment interval going forward at her request.  We reviewed the CBC and chemistry panel from today.  Labs adequate to proceed as above.  She will return for lab, follow-up, irinotecan/Panitumumab in 3 weeks.  Patient seen with Dr.  Benay Spice.    Ned Card ANP/GNP-BC   03/17/2020  11:31 AM  This was a shared visit with Ned Card.  [name] appears unchanged.  She will complete another treatment with irinotecan/Panitumumab today.  She would like to change to an every 3-week treatment schedule.  We will plan for a restaging CT evaluation within the next 2-3 months.  I was present for today's visit.  I performed medical decision making.  Julieanne Manson, MD

## 2020-03-18 ENCOUNTER — Telehealth: Payer: Self-pay | Admitting: Nurse Practitioner

## 2020-03-18 NOTE — Telephone Encounter (Signed)
Scheduled appointments per 2/10 los. Spoke to patient who is aware of appointments dates and times.

## 2020-03-19 ENCOUNTER — Inpatient Hospital Stay: Payer: BC Managed Care – PPO

## 2020-03-19 ENCOUNTER — Other Ambulatory Visit: Payer: Self-pay

## 2020-03-19 VITALS — BP 118/76 | HR 66 | Temp 96.7°F | Resp 18

## 2020-03-19 DIAGNOSIS — C182 Malignant neoplasm of ascending colon: Secondary | ICD-10-CM | POA: Diagnosis not present

## 2020-03-19 DIAGNOSIS — Z5112 Encounter for antineoplastic immunotherapy: Secondary | ICD-10-CM | POA: Diagnosis not present

## 2020-03-19 DIAGNOSIS — Z5111 Encounter for antineoplastic chemotherapy: Secondary | ICD-10-CM | POA: Diagnosis not present

## 2020-03-19 DIAGNOSIS — D6959 Other secondary thrombocytopenia: Secondary | ICD-10-CM | POA: Diagnosis not present

## 2020-03-19 DIAGNOSIS — C787 Secondary malignant neoplasm of liver and intrahepatic bile duct: Secondary | ICD-10-CM | POA: Diagnosis not present

## 2020-03-19 DIAGNOSIS — D509 Iron deficiency anemia, unspecified: Secondary | ICD-10-CM | POA: Diagnosis not present

## 2020-03-19 DIAGNOSIS — Z5189 Encounter for other specified aftercare: Secondary | ICD-10-CM | POA: Diagnosis not present

## 2020-03-19 DIAGNOSIS — C7961 Secondary malignant neoplasm of right ovary: Secondary | ICD-10-CM | POA: Diagnosis not present

## 2020-03-19 DIAGNOSIS — R21 Rash and other nonspecific skin eruption: Secondary | ICD-10-CM | POA: Diagnosis not present

## 2020-03-19 MED ORDER — PEGFILGRASTIM-CBQV 6 MG/0.6ML ~~LOC~~ SOSY
6.0000 mg | PREFILLED_SYRINGE | Freq: Once | SUBCUTANEOUS | Status: AC
  Administered 2020-03-19: 6 mg via SUBCUTANEOUS

## 2020-03-22 ENCOUNTER — Telehealth: Payer: Self-pay | Admitting: *Deleted

## 2020-03-22 ENCOUNTER — Encounter: Payer: Self-pay | Admitting: Oncology

## 2020-03-22 NOTE — Telephone Encounter (Signed)
Called patient in response to her MyChart message re: diarrhea and feeling poorly. Also reported nausea. Had panitumumab and irinotecan on 03/17/20. Requested that she call back early 03/23/20 with update on status and what and how she is taking her meds. May need IV fluids in office.

## 2020-03-23 ENCOUNTER — Other Ambulatory Visit: Payer: Self-pay | Admitting: Nurse Practitioner

## 2020-03-23 ENCOUNTER — Telehealth: Payer: Self-pay | Admitting: *Deleted

## 2020-03-23 ENCOUNTER — Other Ambulatory Visit: Payer: Self-pay

## 2020-03-23 ENCOUNTER — Inpatient Hospital Stay: Payer: BC Managed Care – PPO

## 2020-03-23 VITALS — BP 132/77 | HR 86 | Temp 98.1°F | Resp 18 | Ht 66.0 in | Wt 153.3 lb

## 2020-03-23 DIAGNOSIS — C182 Malignant neoplasm of ascending colon: Secondary | ICD-10-CM

## 2020-03-23 DIAGNOSIS — D6959 Other secondary thrombocytopenia: Secondary | ICD-10-CM | POA: Diagnosis not present

## 2020-03-23 DIAGNOSIS — R21 Rash and other nonspecific skin eruption: Secondary | ICD-10-CM | POA: Diagnosis not present

## 2020-03-23 DIAGNOSIS — Z5189 Encounter for other specified aftercare: Secondary | ICD-10-CM | POA: Diagnosis not present

## 2020-03-23 DIAGNOSIS — C7961 Secondary malignant neoplasm of right ovary: Secondary | ICD-10-CM | POA: Diagnosis not present

## 2020-03-23 DIAGNOSIS — C787 Secondary malignant neoplasm of liver and intrahepatic bile duct: Secondary | ICD-10-CM | POA: Diagnosis not present

## 2020-03-23 DIAGNOSIS — Z5112 Encounter for antineoplastic immunotherapy: Secondary | ICD-10-CM | POA: Diagnosis not present

## 2020-03-23 DIAGNOSIS — D509 Iron deficiency anemia, unspecified: Secondary | ICD-10-CM | POA: Diagnosis not present

## 2020-03-23 DIAGNOSIS — Z95828 Presence of other vascular implants and grafts: Secondary | ICD-10-CM

## 2020-03-23 DIAGNOSIS — Z5111 Encounter for antineoplastic chemotherapy: Secondary | ICD-10-CM | POA: Diagnosis not present

## 2020-03-23 LAB — CMP (CANCER CENTER ONLY)
ALT: 36 U/L (ref 0–44)
AST: 26 U/L (ref 15–41)
Albumin: 4.1 g/dL (ref 3.5–5.0)
Alkaline Phosphatase: 176 U/L — ABNORMAL HIGH (ref 38–126)
Anion gap: 8 (ref 5–15)
BUN: 11 mg/dL (ref 6–20)
CO2: 26 mmol/L (ref 22–32)
Calcium: 9.5 mg/dL (ref 8.9–10.3)
Chloride: 101 mmol/L (ref 98–111)
Creatinine: 0.78 mg/dL (ref 0.44–1.00)
GFR, Estimated: >60 mL/min (ref >60)
Glucose, Bld: 171 mg/dL — ABNORMAL HIGH (ref 70–99)
Potassium: 3.8 mmol/L (ref 3.5–5.1)
Sodium: 135 mmol/L (ref 135–145)
Total Bilirubin: 0.7 mg/dL (ref 0.3–1.2)
Total Protein: 7.3 g/dL (ref 6.5–8.1)

## 2020-03-23 LAB — MAGNESIUM: Magnesium: 1.8 mg/dL (ref 1.7–2.4)

## 2020-03-23 MED ORDER — HEPARIN SOD (PORK) LOCK FLUSH 100 UNIT/ML IV SOLN
500.0000 [IU] | Freq: Once | INTRAVENOUS | Status: AC
Start: 2020-03-23 — End: 2020-03-23
  Administered 2020-03-23: 500 [IU]
  Filled 2020-03-23: qty 5

## 2020-03-23 MED ORDER — SODIUM CHLORIDE 0.9 % IV SOLN
INTRAVENOUS | Status: AC
  Filled 2020-03-23 (×2): qty 250

## 2020-03-23 MED ORDER — SODIUM CHLORIDE 0.9% FLUSH
10.0000 mL | Freq: Once | INTRAVENOUS | Status: AC
  Administered 2020-03-23: 10 mL
  Filled 2020-03-23: qty 10

## 2020-03-23 NOTE — Telephone Encounter (Addendum)
Reports multiple episodes of diarrhea as well as nausea yesterday. "I'm just not feeling well". Currently on the way to work, but could come after 1 pm today for IV fluids. She reports every time she eats, she soon has a liquid stool and reports stools are "large". Takes ~ 4 Lomotil/day (afraid to take 8/day). Nausea is gone today. Wants IV fluids this afternoon if possible. Per Dr. Benay Spice: OK for IV fluids today. Will also have CMP and Mg+ level checked w/C. Diff stool check.

## 2020-03-23 NOTE — Patient Instructions (Signed)
Rehydration, Adult Rehydration is the replacement of body fluids, salts, and minerals (electrolytes) that are lost during dehydration. Dehydration is when there is not enough water or other fluids in the body. This happens when you lose more fluids than you take in. Common causes of dehydration include:  Not drinking enough fluids. This can occur when you are ill or doing activities that require a lot of energy, especially in hot weather.  Conditions that cause loss of water or other fluids, such as diarrhea, vomiting, sweating, or urinating a lot.  Other illnesses, such as fever or infection.  Certain medicines, such as those that remove excess fluid from the body (diuretics). Symptoms of mild or moderate dehydration may include thirst, dry lips and mouth, and dizziness. Symptoms of severe dehydration may include increased heart rate, confusion, fainting, and not urinating. For severe dehydration, you may need to get fluids through an IV at the hospital. For mild or moderate dehydration, you can usually rehydrate at home by drinking certain fluids as told by your health care provider. What are the risks? Generally, rehydration is safe. However, taking in too much fluid (overhydration) can be a problem. This is rare. Overhydration can cause an electrolyte imbalance, kidney failure, or a decrease in salt (sodium) levels in the body. Supplies needed You will need an oral rehydration solution (ORS) if your health care provider tells you to use one. This is a drink to treat dehydration. It can be found in pharmacies and retail stores. How to rehydrate Fluids Follow instructions from your health care provider for rehydration. The kind of fluid and the amount you should drink depend on your condition. In general, you should choose drinks that you prefer.  If told by your health care provider, drink an ORS. ? Make an ORS by following instructions on the package. ? Start by drinking small amounts,  about  cup (120 mL) every 5-10 minutes. ? Slowly increase how much you drink until you have taken the amount recommended by your health care provider.  Drink enough clear fluids to keep your urine pale yellow. If you were told to drink an ORS, finish it first, then start slowly drinking other clear fluids. Drink fluids such as: ? Water. This includes sparkling water and flavored water. Drinking only water can lead to having too little sodium in your body (hyponatremia). Follow the advice of your health care provider. ? Water from ice chips you suck on. ? Fruit juice with water you add to it (diluted). ? Sports drinks. ? Hot or cold herbal teas. ? Broth-based soups. ? Milk or milk products. Food Follow instructions from your health care provider about what to eat while you rehydrate. Your health care provider may recommend that you slowly begin eating regular foods in small amounts.  Eat foods that contain a healthy balance of electrolytes, such as bananas, oranges, potatoes, tomatoes, and spinach.  Avoid foods that are greasy or contain a lot of sugar. In some cases, you may get nutrition through a feeding tube that is passed through your nose and into your stomach (nasogastric tube, or NG tube). This may be done if you have uncontrolled vomiting or diarrhea.   Beverages to avoid Certain beverages may make dehydration worse. While you rehydrate, avoid drinking alcohol.   How to tell if you are recovering from dehydration You may be recovering from dehydration if:  You are urinating more often than before you started rehydrating.  Your urine is pale yellow.  Your energy level   improves.  You vomit less frequently.  You have diarrhea less frequently.  Your appetite improves or returns to normal.  You feel less dizzy or less light-headed.  Your skin tone and color start to look more normal. Follow these instructions at home:  Take over-the-counter and prescription medicines only  as told by your health care provider.  Do not take sodium tablets. Doing this can lead to having too much sodium in your body (hypernatremia). Contact a health care provider if:  You continue to have symptoms of mild or moderate dehydration, such as: ? Thirst. ? Dry lips. ? Slightly dry mouth. ? Dizziness. ? Dark urine or less urine than normal. ? Muscle cramps.  You continue to vomit or have diarrhea. Get help right away if you:  Have symptoms of dehydration that get worse.  Have a fever.  Have a severe headache.  Have been vomiting and the following happens: ? Your vomiting gets worse or does not go away. ? Your vomit includes blood or green matter (bile). ? You cannot eat or drink without vomiting.  Have problems with urination or bowel movements, such as: ? Diarrhea that gets worse or does not go away. ? Blood in your stool (feces). This may cause stool to look black and tarry. ? Not urinating, or urinating only a small amount of very dark urine, within 6-8 hours.  Have trouble breathing.  Have symptoms that get worse with treatment. These symptoms may represent a serious problem that is an emergency. Do not wait to see if the symptoms will go away. Get medical help right away. Call your local emergency services (911 in the [place] Do not drive yourself to the hospital. Summary  Rehydration is the replacement of body fluids and minerals (electrolytes) that are lost during dehydration.  Follow instructions from your health care provider for rehydration. The kind of fluid and amount you should drink depend on your condition.  Slowly increase how much you drink until you have taken the amount recommended by your health care provider.  Contact your health care provider if you continue to show signs of mild or moderate dehydration. This information is not intended to replace advice given to you by your health care provider. Make sure you discuss any questions you have with  your health care provider. Document Revised: 03/25/2019 Document Reviewed: 02/02/2019 Elsevier Patient Education  2021 Elsevier Inc.  

## 2020-03-23 NOTE — Progress Notes (Signed)
Patient unable to provide sample for c-diff pcr.  Patient sent home with c-diff kit, including the written instructions.  Patient verbalized understanding of the instructions and will take sample to [facility], if able to obtain.

## 2020-03-25 ENCOUNTER — Encounter: Payer: Self-pay | Admitting: Oncology

## 2020-03-25 ENCOUNTER — Other Ambulatory Visit: Payer: Self-pay | Admitting: Oncology

## 2020-03-25 DIAGNOSIS — C182 Malignant neoplasm of ascending colon: Secondary | ICD-10-CM

## 2020-03-25 LAB — C DIFFICILE QUICK SCREEN W PCR REFLEX
C Diff antigen: NEGATIVE
C Diff interpretation: NOT DETECTED
C Diff toxin: NEGATIVE

## 2020-03-28 ENCOUNTER — Encounter: Payer: Self-pay | Admitting: *Deleted

## 2020-04-03 ENCOUNTER — Other Ambulatory Visit: Payer: Self-pay | Admitting: Oncology

## 2020-04-07 ENCOUNTER — Inpatient Hospital Stay: Payer: BC Managed Care – PPO | Admitting: Oncology

## 2020-04-07 ENCOUNTER — Inpatient Hospital Stay: Payer: BC Managed Care – PPO

## 2020-04-07 ENCOUNTER — Other Ambulatory Visit: Payer: Self-pay | Admitting: *Deleted

## 2020-04-07 ENCOUNTER — Other Ambulatory Visit: Payer: Self-pay

## 2020-04-07 ENCOUNTER — Inpatient Hospital Stay: Payer: BC Managed Care – PPO | Attending: Oncology

## 2020-04-07 DIAGNOSIS — R21 Rash and other nonspecific skin eruption: Secondary | ICD-10-CM | POA: Diagnosis not present

## 2020-04-07 DIAGNOSIS — C7961 Secondary malignant neoplasm of right ovary: Secondary | ICD-10-CM | POA: Diagnosis not present

## 2020-04-07 DIAGNOSIS — Z95828 Presence of other vascular implants and grafts: Secondary | ICD-10-CM

## 2020-04-07 DIAGNOSIS — C182 Malignant neoplasm of ascending colon: Secondary | ICD-10-CM

## 2020-04-07 DIAGNOSIS — Z5111 Encounter for antineoplastic chemotherapy: Secondary | ICD-10-CM | POA: Diagnosis not present

## 2020-04-07 DIAGNOSIS — C787 Secondary malignant neoplasm of liver and intrahepatic bile duct: Secondary | ICD-10-CM | POA: Insufficient documentation

## 2020-04-07 DIAGNOSIS — Z5112 Encounter for antineoplastic immunotherapy: Secondary | ICD-10-CM | POA: Insufficient documentation

## 2020-04-07 DIAGNOSIS — Z5189 Encounter for other specified aftercare: Secondary | ICD-10-CM | POA: Diagnosis not present

## 2020-04-07 DIAGNOSIS — D509 Iron deficiency anemia, unspecified: Secondary | ICD-10-CM | POA: Diagnosis not present

## 2020-04-07 DIAGNOSIS — D6959 Other secondary thrombocytopenia: Secondary | ICD-10-CM | POA: Diagnosis not present

## 2020-04-07 LAB — CBC WITH DIFFERENTIAL (CANCER CENTER ONLY)
Abs Immature Granulocytes: 0.04 10*3/uL (ref 0.00–0.07)
Basophils Absolute: 0 10*3/uL (ref 0.0–0.1)
Basophils Relative: 0 %
Eosinophils Absolute: 0.2 10*3/uL (ref 0.0–0.5)
Eosinophils Relative: 4 %
HCT: 35.9 % — ABNORMAL LOW (ref 36.0–46.0)
Hemoglobin: 11.2 g/dL — ABNORMAL LOW (ref 12.0–15.0)
Immature Granulocytes: 1 %
Lymphocytes Relative: 22 %
Lymphs Abs: 1 10*3/uL (ref 0.7–4.0)
MCH: 23.9 pg — ABNORMAL LOW (ref 26.0–34.0)
MCHC: 31.2 g/dL (ref 30.0–36.0)
MCV: 76.5 fL — ABNORMAL LOW (ref 80.0–100.0)
Monocytes Absolute: 0.3 10*3/uL (ref 0.1–1.0)
Monocytes Relative: 7 %
Neutro Abs: 3.1 10*3/uL (ref 1.7–7.7)
Neutrophils Relative %: 66 %
Platelet Count: 168 10*3/uL (ref 150–400)
RBC: 4.69 MIL/uL (ref 3.87–5.11)
RDW: 16.9 % — ABNORMAL HIGH (ref 11.5–15.5)
WBC Count: 4.6 10*3/uL (ref 4.0–10.5)
nRBC: 0 % (ref 0.0–0.2)

## 2020-04-07 LAB — CMP (CANCER CENTER ONLY)
ALT: 39 U/L (ref 0–44)
AST: 33 U/L (ref 15–41)
Albumin: 3.8 g/dL (ref 3.5–5.0)
Alkaline Phosphatase: 127 U/L — ABNORMAL HIGH (ref 38–126)
Anion gap: 8 (ref 5–15)
BUN: 8 mg/dL (ref 6–20)
CO2: 23 mmol/L (ref 22–32)
Calcium: 9 mg/dL (ref 8.9–10.3)
Chloride: 108 mmol/L (ref 98–111)
Creatinine: 0.65 mg/dL (ref 0.44–1.00)
GFR, Estimated: >60 mL/min (ref >60)
Glucose, Bld: 166 mg/dL — ABNORMAL HIGH (ref 70–99)
Potassium: 3.8 mmol/L (ref 3.5–5.1)
Sodium: 139 mmol/L (ref 135–145)
Total Bilirubin: 0.5 mg/dL (ref 0.3–1.2)
Total Protein: 7.1 g/dL (ref 6.5–8.1)

## 2020-04-07 LAB — MAGNESIUM: Magnesium: 1.4 mg/dL — ABNORMAL LOW (ref 1.7–2.4)

## 2020-04-07 LAB — CEA (IN HOUSE-CHCC): CEA (CHCC-In House): 5.44 ng/mL — ABNORMAL HIGH (ref 0.00–5.00)

## 2020-04-07 MED ORDER — HYDROCORTISONE 2.5 % EX CREA: TOPICAL_CREAM | Freq: Two times a day (BID) | CUTANEOUS | 2 refills | Status: DC

## 2020-04-07 MED ORDER — MAGNESIUM SULFATE 2 GM/50ML IV SOLN
2.0000 g | Freq: Once | INTRAVENOUS | Status: AC
  Administered 2020-04-07: 2 g via INTRAVENOUS

## 2020-04-07 MED ORDER — HEPARIN SOD (PORK) LOCK FLUSH 100 UNIT/ML IV SOLN
500.0000 [IU] | Freq: Once | INTRAVENOUS | Status: AC | PRN
  Administered 2020-04-07: 500 [IU]
  Filled 2020-04-07: qty 5

## 2020-04-07 MED ORDER — PALONOSETRON HCL INJECTION 0.25 MG/5ML
0.2500 mg | Freq: Once | INTRAVENOUS | Status: AC
  Administered 2020-04-07: 0.25 mg via INTRAVENOUS

## 2020-04-07 MED ORDER — SODIUM CHLORIDE 0.9% FLUSH
10.0000 mL | Freq: Once | INTRAVENOUS | Status: AC
  Administered 2020-04-07: 10 mL
  Filled 2020-04-07: qty 10

## 2020-04-07 MED ORDER — SODIUM CHLORIDE 0.9 % IV SOLN
140.0000 mg/m2 | Freq: Once | INTRAVENOUS | Status: AC
  Administered 2020-04-07: 260 mg via INTRAVENOUS
  Filled 2020-04-07: qty 13

## 2020-04-07 MED ORDER — PALONOSETRON HCL INJECTION 0.25 MG/5ML
INTRAVENOUS | Status: AC
  Filled 2020-04-07: qty 5

## 2020-04-07 MED ORDER — ATROPINE SULFATE 1 MG/ML IJ SOLN
INTRAMUSCULAR | Status: AC
  Filled 2020-04-07: qty 1

## 2020-04-07 MED ORDER — SODIUM CHLORIDE 0.9 % IV SOLN
6.0000 mg/kg | Freq: Once | INTRAVENOUS | Status: AC
  Administered 2020-04-07: 400 mg via INTRAVENOUS
  Filled 2020-04-07: qty 20

## 2020-04-07 MED ORDER — SODIUM CHLORIDE 0.9 % IV SOLN
10.0000 mg | Freq: Once | INTRAVENOUS | Status: AC
  Administered 2020-04-07: 10 mg via INTRAVENOUS
  Filled 2020-04-07: qty 10

## 2020-04-07 MED ORDER — MAGNESIUM SULFATE 2 GM/50ML IV SOLN
INTRAVENOUS | Status: AC
  Filled 2020-04-07: qty 50

## 2020-04-07 MED ORDER — SODIUM CHLORIDE 0.9 % IV SOLN
Freq: Once | INTRAVENOUS | Status: AC
  Filled 2020-04-07: qty 250

## 2020-04-07 MED ORDER — ATROPINE SULFATE 1 MG/ML IJ SOLN
0.5000 mg | Freq: Once | INTRAMUSCULAR | Status: AC | PRN
  Administered 2020-04-07: 0.5 mg via INTRAVENOUS

## 2020-04-07 MED ORDER — SODIUM CHLORIDE 0.9% FLUSH
10.0000 mL | INTRAVENOUS | Status: DC | PRN
  Administered 2020-04-07: 10 mL
  Filled 2020-04-07: qty 10

## 2020-04-07 MED ORDER — DIPHENOXYLATE-ATROPINE 2.5-0.025 MG PO TABS: ORAL_TABLET | ORAL | 1 refills | Status: DC

## 2020-04-07 NOTE — Progress Notes (Signed)
Per Dr. Benay Spice, ok to proceed with treatment with Mg 1.4. Verbal orders received to give 2 grams Mg IV during infusion. Seth Bake Lippucci, Pharmacist to put Mg orders in and to check compatibility with chemo treatment.

## 2020-04-07 NOTE — Patient Instructions (Signed)
Implanted Port Insertion, Care After This sheet gives you information about how to care for yourself after your procedure. Your health care provider may also give you more specific instructions. If you have problems or questions, contact your health care provider. What can I expect after the procedure? After the procedure, it is common to have:  Discomfort at the port insertion site.  Bruising on the skin over the port. This should improve over 3-4 days. Follow these instructions at home: Port care  After your port is placed, you will get a manufacturer's information card. The card has information about your port. Keep this card with you at all times.  Take care of the port as told by your health care provider. Ask your health care provider if you or a family member can get training for taking care of the port at home. A home health care nurse may also take care of the port.  Make sure to remember what type of port you have. Incision care  Follow instructions from your health care provider about how to take care of your port insertion site. Make sure you: ? Wash your hands with soap and water before and after you change your bandage (dressing). If soap and water are not available, use hand sanitizer. ? Change your dressing as told by your health care provider. ? Leave stitches (sutures), skin glue, or adhesive strips in place. These skin closures may need to stay in place for 2 weeks or longer. If adhesive strip edges start to loosen and curl up, you may trim the loose edges. Do not remove adhesive strips completely unless your health care provider tells you to do that.  Check your port insertion site every day for signs of infection. Check for: ? Redness, swelling, or pain. ? Fluid or blood. ? Warmth. ? Pus or a bad smell.      Activity  Return to your normal activities as told by your health care provider. Ask your health care provider what activities are safe for you.  Do not  lift anything that is heavier than 10 lb (4.5 kg), or the limit that you are told, until your health care provider says that it is safe. General instructions  Take over-the-counter and prescription medicines only as told by your health care provider.  Do not take baths, swim, or use a hot tub until your health care provider approves. Ask your health care provider if you may take showers. You may only be allowed to take sponge baths.  Do not drive for 24 hours if you were given a sedative during your procedure.  Wear a medical alert bracelet in case of an emergency. This will tell any health care providers that you have a port.  Keep all follow-up visits as told by your health care provider. This is important. Contact a health care provider if:  You cannot flush your port with saline as directed, or you cannot draw blood from the port.  You have a fever or chills.  You have redness, swelling, or pain around your port insertion site.  You have fluid or blood coming from your port insertion site.  Your port insertion site feels warm to the touch.  You have pus or a bad smell coming from the port insertion site. Get help right away if:  You have chest pain or shortness of breath.  You have bleeding from your port that you cannot control. Summary  Take care of the port as told by your   health care provider. Keep the manufacturer's information card with you at all times.  Change your dressing as told by your health care provider.  Contact a health care provider if you have a fever or chills or if you have redness, swelling, or pain around your port insertion site.  Keep all follow-up visits as told by your health care provider. This information is not intended to replace advice given to you by your health care provider. Make sure you discuss any questions you have with your health care provider. Document Revised: 08/20/2017 Document Reviewed: 08/20/2017 Elsevier Patient Education   2021 Elsevier Inc.  

## 2020-04-07 NOTE — Patient Instructions (Signed)
[facility] Discharge Instructions for Patients Receiving Chemotherapy  Today you received the following immunotherapy agent: Panitumumab (Vectibix) and chemotherapy agent: Irinotecan  To help prevent nausea and vomiting after your treatment, we encourage you to take your nausea medication as directed by your MD.   If you develop nausea and vomiting that is not controlled by your nausea medication, call the clinic.   BELOW ARE SYMPTOMS THAT SHOULD BE REPORTED IMMEDIATELY:  *FEVER GREATER THAN 100.5 F  *CHILLS WITH OR WITHOUT FEVER  NAUSEA AND VOMITING THAT IS NOT CONTROLLED WITH YOUR NAUSEA MEDICATION  *UNUSUAL SHORTNESS OF BREATH  *UNUSUAL BRUISING OR BLEEDING  TENDERNESS IN MOUTH AND THROAT WITH OR WITHOUT PRESENCE OF ULCERS  *URINARY PROBLEMS  *BOWEL PROBLEMS  UNUSUAL RASH Items with * indicate a potential emergency and should be followed up as soon as possible.  Feel free to call the clinic should you have any questions or concerns. The clinic phone number is [phone].  Please show the CHEMO ALERT CARD at check-in to the Emergency Department and triage nurse.   

## 2020-04-07 NOTE — Progress Notes (Signed)
[place] OFFICE PROGRESS NOTE   Diagnosis: Colon cancer  INTERVAL HISTORY:   [name] completed another treatment with irinotecan and Panitumumab on 03/17/2020.  No nausea/vomiting or mouth sores.  The skin rash has progressed over the face.  She has a few linear ulcers at the fingertips.  She is working.  She is currently not taking the lacosamide as her insurance will not approve this.  She is working on a patient assistance program and is scheduled to see Dr. Tomi Likens next week.  She reports diarrhea beginning on day 3.  She had diarrhea up to 6-7 times per day, partially improved with Imodium and Lomotil.  She now has 2 bowel movements per day.  The abdominal wound remains healed.  Objective:  Vital signs in last 24 hours:  Blood pressure 118/67, pulse 83, temperature 97.8 F (36.6 C), temperature source Tympanic, resp. rate 18, height 5' 6"  (1.676 m), weight 155 lb 12.8 oz (70.7 kg), SpO2 100 %.    HEENT: No thrush or ulcers.  Geographic tongue. Resp: Lungs clear bilaterally Cardio: Regular rate and rhythm GI: No hepatosplenomegaly, healed surgical incisions Vascular: No leg edema  Skin: Mild acne type rash over the face, dryness over the trunk  Portacath/PICC-without erythema  Lab Results:  Lab Results  Component Value Date   WBC 6.3 03/17/2020   HGB 11.1 (L) 03/17/2020   HCT 36.3 03/17/2020   MCV 77.9 (L) 03/17/2020   PLT 114 (L) 03/17/2020   NEUTROABS 4.8 03/17/2020    CMP  Lab Results  Component Value Date   NA 135 03/23/2020   K 3.8 03/23/2020   CL 101 03/23/2020   CO2 26 03/23/2020   GLUCOSE 171 (H) 03/23/2020   BUN 11 03/23/2020   CREATININE 0.78 03/23/2020   CALCIUM 9.5 03/23/2020   PROT 7.3 03/23/2020   ALBUMIN 4.1 03/23/2020   AST 26 03/23/2020   ALT 36 03/23/2020   ALKPHOS 176 (H) 03/23/2020   BILITOT 0.7 03/23/2020   GFRNONAA >60 03/23/2020   GFRAA >60 11/05/2019    Lab Results  Component Value Date   CEA1 4.82  03/17/2020     Medications: I have reviewed the patient's current medications.   Assessment/Plan: 1. 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  The tumor returned microsatellite stable with no loss of mismatch repair protein expression   APC mutated. No BRAF, KRAS, or NRAS mutation On Foundation 1 testing   Cycle 1 adjuvant FOLFOX 09/08/2013   Cycle 2 adjuvant FOLFOX 09/24/2013   Cycle 3 adjuvant FOLFOX 10/08/2013.   Cycle 4 adjuvant FOLFOX 10/22/2013.   Cycle 5 adjuvant FOLFOX 11/05/2013. Oxaliplatin held due to thrombocytopenia.  Cycle 6 FOLFOX 11/19/2013.  Cycle 7 FOLFOX 12/03/2013. Oxaliplatin held secondary to thrombocytopenia.  Cycle 8 FOLFOX 12/17/2013.  Cycle 9 FOLFOX 01/04/2014. Oxaliplatin held secondary to neutropenia.   Cycle 10 FOLFOX 01/21/2014. Oxaliplatin held secondary to thrombocytopenia.  Cycle 11 FOLFOX 02/04/2014  Cycle 12 FOLFOX 02/18/2014, oxaliplatin dose reduced secondary tothrombocytopenia  CT abdomen/pelvis 01/30/2014 revealed splenomegaly and no evidence of recurrent colon cancer  CT chest 04/07/2014 with a stable right lower lobe nodule and no evidence for metastatic disease, no nodules seen on the CT 11/26/2014  Markedly elevated CEA 11/24/2014  CT 11/26/2014 revealed a right pelvic mass, splenomegaly, small volume ascites  Right salpingo-oophorectomy 12/28/2014 with the pathology confirming metastatic colon cancer  CTs 03/23/2016-no evidence of recurrent or metastatic disease.  CT 11/26/2016-enlargement of a fluid density structure the right pelvic sidewall, no other evidence of metastatic disease  CT aspiration right pelvic cyst 12/19/2016.Cytology-BENIGN REACTIVE/REPARATIVE CHANGES.  CTs 06/05/2017-no evidence of metastatic disease, mild cirrhotic changes with splenomegaly  CTs 12/11/2017-recurrent cystic right adnexal  mass, similar to on the CT 11/26/2016, stable mild splenomegaly  CTs 04/23/2018-enlargement of cystic right adnexal mass with mural nodularity, no other evidence of metastatic disease  Cytoreductive surgery/HIPEC with mitomycin by Dr. Clovis Riley at [facility] 08/12/2018-R1 resection achieved. Cytoreduction included omentectomy, LAR, right salpingo-oophorectomy and left colonic gutter/pelvic stripping. Pathology on the rectum showed recurrent/metastatic adenocarcinoma, tumor 2.0 cm, predominantly involving the subserosa and muscularis propria of the colon, proximal and distal margins of resection were negative, vascular invasion present, metastatic carcinoma present in 1 out of 5 lymph nodes; omentum resection with no malignancy seen, no metastatic carcinoma identified in 1 lymph node examined; left gutter stripping positive metastatic adenocarcinoma; right ovary resection positive metastatic adenocarcinoma.  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  CEA 68 02/03/2019  Biopsy liver lesion 02/11/2019-metastatic adenocarcinoma consistent with primary colonic adenocarcinoma  CTs 02/27/2019-multiple liver lesions increased in size, new lesion in the lateral right lobe of the liver. No evidence of metastatic disease in the chest. Redemonstrated moderate left hydronephrosis and proximal hydroureter without discrete lesion or obstructing etiology at the transition point of the mid ureter.  Cycle 1 FOLFIRI/Panitumumab 03/12/2019  Cycle 2 FOLFIRI/Panitumumab 03/26/2019-bolus 5-FU and irinotecan held secondary to neutropenia  Cycle 3 FOLFIRI/Panitumumab 04/09/2019, Udenyca added-not given secondary to seizure/discontinuation of the 5-FU pump  CT abdomen/pelvis at [facility] 05/04/2019-no residual fluid collection at the left abdominal wall abscess, stable moderate left hydronephrosis, multifocal indeterminate liver lesions--liver  lesionssignificantly improved  Cycle 4 FOLFIRI/Panitumumab 05/07/2019, Udenyca  Cycle 5 FOLFIRI/Panitumumab 05/20/2019, Udenyca  Cycle 6 FOLFIRI/Panitumumab 06/18/2019, Udenyca  Cycle 7 FOLFIRI/Panitumumab 07/01/2019 (Irinotecan dose reduced due to thrombocytopenia), Udenyca--Udenyca was not given  Cycle 8 FOLFIRI/Panitumumab 07/15/2019, Udenyca  Cycle 9 FOLFIRI/Panitumumab 07/29/2019, Udenyca  Cycle 10 FOLFIRI/Panitumumab 08/13/2019, Udenyca  CTs at [facility] 08/19/2019-decreased size of the hypodense and hyperdense lesions within segment 2 of the left hepatic lobe. No new lesions identified. Similar presacral soft tissue thickening with adjacent stable alignment in the rectum. Improved but persistent left UPJ obstruction with mild left hydronephrosis. Persistent but decreased size of the wound within the left mid abdomen abdominal wall.  Cycle 11 irinotecan/Panitumumab 08/27/2019, Udenyca  Cycle 12 irinotecan/Panitumumab 10/08/2019, Udenyca  Cycle 13 irinotecan/Panitumumab 11/05/2019, Udenyca  CT abdomen/pelvis 11/19/2019-no new or progressive interval findings. Stable appearance of the calcified lesion in the anterior left hepatic dome with second tiny low-density lesion in the dome of the lateral segment left liver. Both lesions are markedly decreased since 02/27/2019. Stable mild fullness left intrarenal collecting system and renal pelvis. Similar appearance of abnormal soft tissue in the left paramidline subcutaneous fat with associated skin thickening, this likely correlates to the fistula.  Cycle 14 irinotecan/Panitumumab 11/19/2019, Udenyca  Cycle 15 irinotecan/Panitumumab 12/02/2019, Udenyca  CT abdomen/pelvis at [facility] 02/11/2020-similar to slight decrease in segment 2 hypodense and hyperdense lesions (compared to a CT from July 2020), no new lesions, splenomegaly, no fluid collection or abscess, mild stranding adjacent to the incision with overlying skin thickening  Cycle  16 irinotecan/Panitumumab 03/03/2020, Udenyca  Cycle 17 irinotecan/Panitumumab 03/17/2020, Udenyca  Cycle 18 irinotecan/panitumumab 04/07/2020, Udenyca 2. Mild elevation of the CEA beginning January 2016, normal on 05/19/2014   3. History of iron deficiency anemia  4. seizure  disorder; seizure 04/09/2019. Brain CT negative. Now on Keppra. 5. history of depression  6. 4 mm right lower lobe nodule on a staging chest CT 09/08/2013 , stable on a CT 04/07/2014 7. Hospitalization 09/24/2013 through 09/26/2013 with fever and abdominal pain.  8. 09/24/2013 urine culture positive for coag negative staph.  9. 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 Splenomegaly noted on a CT scan 01/30/2014,persistent on repeat CTs 12. Colonoscopy 11/17/2018-flexible sigmoidoscopy per rectum with changes of mild diversion colitis. Scope advanced for approximately 25 cm. Most proximal portion had necrotic appearing debris. Colostomy bag insufflated suggesting some type of communication between the pouch and the proximal colon. Introduction of scope into the ostomy found to available directions. 1 toward the distal pouch with similar appearing necrotic debris encountered. The other was about a 30 cm segment of normal-appearing colonic mucosa to the level of the previous right hemicolectomy ileocolonic anastomosis. 51. Port-A-Cath placement interventional radiology 03/05/2019 14. Left leg/foot weakness-brain MRI 09/03/2019 with no evidence of metastatic disease, referred to physical therapy 15. 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 No malignancy.  16. COVID-19 + 09/10/2019 17. Hospitalized with seizure 09/17/2019 through 09/19/2019-MRI brain without evidence of metastatic disease. Seen by neurology. Keppra dose increased. 19. Rash secondary to Panitumumab     Disposition: [name] appears stable.  She will complete another treatment with irinotecan and Panitumumab today.  She will try hydrocortisone cream on the face rash.  She will discontinue fluticasone.  She will call for diarrhea not controlled with Imodium and Lomotil.  I encouraged her to follow-up with Dr. Tomi Likens to resume seizure prophylaxis.  Betsy Coder, MD  04/07/2020  8:42 AM

## 2020-04-08 ENCOUNTER — Ambulatory Visit
Admission: RE | Admit: 2020-04-08 | Discharge: 2020-04-08 | Disposition: A | Discharge status: Home / Self Care | Payer: Self-pay | Source: Ambulatory Visit | Attending: Oncology | Admitting: Oncology

## 2020-04-08 ENCOUNTER — Other Ambulatory Visit: Payer: Self-pay

## 2020-04-08 DIAGNOSIS — C182 Malignant neoplasm of ascending colon: Secondary | ICD-10-CM

## 2020-04-09 ENCOUNTER — Inpatient Hospital Stay: Payer: BC Managed Care – PPO

## 2020-04-09 ENCOUNTER — Other Ambulatory Visit: Payer: Self-pay

## 2020-04-09 VITALS — BP 121/81 | HR 68 | Temp 97.7°F | Resp 17

## 2020-04-09 DIAGNOSIS — Z5111 Encounter for antineoplastic chemotherapy: Secondary | ICD-10-CM | POA: Diagnosis not present

## 2020-04-09 DIAGNOSIS — C7961 Secondary malignant neoplasm of right ovary: Secondary | ICD-10-CM | POA: Diagnosis not present

## 2020-04-09 DIAGNOSIS — Z5112 Encounter for antineoplastic immunotherapy: Secondary | ICD-10-CM | POA: Diagnosis not present

## 2020-04-09 DIAGNOSIS — C787 Secondary malignant neoplasm of liver and intrahepatic bile duct: Secondary | ICD-10-CM | POA: Diagnosis not present

## 2020-04-09 DIAGNOSIS — D509 Iron deficiency anemia, unspecified: Secondary | ICD-10-CM | POA: Diagnosis not present

## 2020-04-09 DIAGNOSIS — C182 Malignant neoplasm of ascending colon: Secondary | ICD-10-CM | POA: Diagnosis not present

## 2020-04-09 DIAGNOSIS — D6959 Other secondary thrombocytopenia: Secondary | ICD-10-CM | POA: Diagnosis not present

## 2020-04-09 DIAGNOSIS — Z5189 Encounter for other specified aftercare: Secondary | ICD-10-CM | POA: Diagnosis not present

## 2020-04-09 DIAGNOSIS — R21 Rash and other nonspecific skin eruption: Secondary | ICD-10-CM | POA: Diagnosis not present

## 2020-04-09 MED ORDER — PEGFILGRASTIM-CBQV 6 MG/0.6ML ~~LOC~~ SOSY
6.0000 mg | PREFILLED_SYRINGE | Freq: Once | SUBCUTANEOUS | Status: AC
  Administered 2020-04-09: 6 mg via SUBCUTANEOUS

## 2020-04-09 NOTE — Patient Instructions (Signed)
Pegfilgrastim injection What is this medicine? PEGFILGRASTIM (PEG fil gra stim) is a long-acting granulocyte colony-stimulating factor that stimulates the growth of neutrophils, a type of white blood cell important in the body's fight against infection. It is used to reduce the incidence of fever and infection in patients with certain types of cancer who are receiving chemotherapy that affects the bone marrow, and to increase survival after being exposed to high doses of radiation. This medicine may be used for other purposes; ask your health care provider or pharmacist if you have questions. COMMON BRAND NAME(S): Fulphila, Neulasta, Nyvepria, UDENYCA, Ziextenzo What should I tell my health care provider before I take this medicine? They need to know if you have any of these conditions:  kidney disease  latex allergy  ongoing radiation therapy  sickle cell disease  skin reactions to acrylic adhesives (On-Body Injector only)  an unusual or allergic reaction to pegfilgrastim, filgrastim, other medicines, foods, dyes, or preservatives  pregnant or trying to get pregnant  breast-feeding How should I use this medicine? This medicine is for injection under the skin. If you get this medicine at home, you will be taught how to prepare and give the pre-filled syringe or how to use the On-body Injector. Refer to the patient Instructions for Use for detailed instructions. Use exactly as directed. Tell your healthcare provider immediately if you suspect that the On-body Injector may not have performed as intended or if you suspect the use of the On-body Injector resulted in a missed or partial dose. It is important that you put your used needles and syringes in a special sharps container. Do not put them in a trash can. If you do not have a sharps container, call your pharmacist or healthcare provider to get one. Talk to your pediatrician regarding the use of this medicine in children. While this drug  may be prescribed for selected conditions, precautions do apply. Overdosage: If you think you have taken too much of this medicine contact a poison control center or emergency room at once. NOTE: This medicine is only for you. Do not share this medicine with others. What if I miss a dose? It is important not to miss your dose. Call your doctor or health care professional if you miss your dose. If you miss a dose due to an On-body Injector failure or leakage, a new dose should be administered as soon as possible using a single prefilled syringe for manual use. What may interact with this medicine? Interactions have not been studied. This list may not describe all possible interactions. Give your health care provider a list of all the medicines, herbs, non-prescription drugs, or dietary supplements you use. Also tell them if you smoke, drink alcohol, or use illegal drugs. Some items may interact with your medicine. What should I watch for while using this medicine? Your condition will be monitored carefully while you are receiving this medicine. You may need blood work done while you are taking this medicine. Talk to your health care provider about your risk of cancer. You may be more at risk for certain types of cancer if you take this medicine. If you are going to need a MRI, CT scan, or other procedure, tell your doctor that you are using this medicine (On-Body Injector only). What side effects may I notice from receiving this medicine? Side effects that you should report to your doctor or health care professional as soon as possible:  allergic reactions (skin rash, itching or hives, swelling of   the face, lips, or tongue)  back pain  dizziness  fever  pain, redness, or irritation at site where injected  pinpoint red spots on the skin  red or dark-[place] urine  shortness of breath or breathing problems  stomach or side pain, or pain at the shoulder  swelling  tiredness  trouble  passing urine or change in the amount of urine  unusual bruising or bleeding Side effects that usually do not require medical attention (report to your doctor or health care professional if they continue or are bothersome):  bone pain  muscle pain This list may not describe all possible side effects. Call your doctor for medical advice about side effects. You may report side effects to FDA at 1-800-FDA-1088. Where should I keep my medicine? Keep out of the reach of children. If you are using this medicine at home, you will be instructed on how to store it. Throw away any unused medicine after the expiration date on the label. NOTE: This sheet is a summary. It may not cover all possible information. If you have questions about this medicine, talk to your doctor, pharmacist, or health care provider.  2021 Elsevier/Gold Standard (2019-02-13 13:20:51)  

## 2020-04-11 ENCOUNTER — Telehealth: Payer: Self-pay | Admitting: Oncology

## 2020-04-11 NOTE — Telephone Encounter (Signed)
Scheduled appointments per 3/3 los. Called patient, no answer. Left message with appointments dates and times.

## 2020-04-13 ENCOUNTER — Encounter: Payer: Self-pay | Admitting: Oncology

## 2020-04-13 NOTE — Progress Notes (Signed)
NEUROLOGY FOLLOW UP OFFICE NOTE  [name] [id number]  Assessment/Plan:   Primary generalized epilepsy.  I think that Vimpat would be the best option for her.  She had side effects to Keppra.  She has previously been on carbamazepine.  I would rather not try lamotrigine as she already has had rashes with other medications, such as her chemotherapy.  Will have her apply for the financial assistance program.  1.  Restart Vimpat 100mg  BID for one week, then 200mg  twice daily 2.  Follow up 6 months if we need to change AED, otherwise one year.  Subjective:  [name]. [place] is a 71 old female with metastatic colon cancer, depression, anxiety, migraine and restless leg syndrome who follows up for primary generalized epilepsy.  UPDATE: Current medications:  Vimpat 200mg  BID  Due to irritability, she was transitioned from Keppra to Vimpat.  However, she hasn't been on it for a couple of months because insurance would no longer cover it.    She began having seizures in 2009. There was no precipitating factor. Her seizures present as generalized tonic-clonic. Reportedly, she has had several seizures while asleep, as she sometimes wakes up having bit her tongue but no blood on the pillow. No witnesses to seizure activity at night. She usually has migraine the next day. MRI of brain with and without contrast on 09/10/2008 was unremarkable.  EEG from 11/08/2010 showed generalized polyspike and wave discharged.    She established care with me in July 2014 after having a seizure.  At the time, she was on Keppra XR 1500mg  twice daily.  Due to mood changes, she was switched to lamotrigine.  She was last seen by me in July 2015.  She had a lot going on and subsequently stopped her medication and was lost to follow-up.  She was seizure-free until March 2021 when she had a seizure.  It was suspected that it may have been provoked by Levaquin which she was taking to treat an abscess from her colostomy  reversal.  EEG showed generalized epileptiform activity.  She was restarted on Keppra 500mg  twice daily.  MRI of brain with and without contrast on 09/03/2019 personally reviewed was unremarkable.  She was diagnosed with COVID-19 in August 2021.  A week later, she was admitted to the hospital for multiple seizures, diagnosed with status epilepticus. Limited MRI of brain without contrast was unremarkable.  EEG showed generalized and left frontotemporal spikes and polyspike waves.  Keppra was increased to 1500mg  twice daily.  Since then, she reports irritability, impulsiveness (speaking without a filter), and trouble sleeping.  She would like to change medication.   Previous medications include Keppra (irritability) and phenobarbital, but she cannot recall if she had been on anything else. Records mention carbamazepine.  Lamotrigine.  PAST MEDICAL HISTORY: Past Medical History:  Diagnosis Date  . ADD (attention deficit disorder)   . Anemia, iron deficiency   . Anxiety   . Colon cancer ([place]) 08/2013  . Depression   . Difficulty swallowing pills   . GERD (gastroesophageal reflux disease)   . Headache    migraines "long time ago"  . History of blood transfusion 08/16/2013  . History of migraine    no problems in "a long time"  . 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 Restless leg syndrome   . Seizures ([place])    last seizure 08/2012    MEDICATIONS: Current Outpatient Medications on File Prior to Visit  Medication Sig Dispense Refill  .  acetaminophen (TYLENOL) 500 MG tablet Take 1,000 mg by mouth every 6 (six) hours as needed.    . diphenoxylate-atropine (LOMOTIL) 2.5-0.025 MG tablet TAKE 1- 2 TABLETS BY MOUTH 4 TIMES DAILY AS NEEDED FOR DIARRHEA /  LOOSE  STOOLS 60 tablet 1  . doxycycline (VIBRA-TABS) 100 MG tablet Take 1 tablet (100 mg total) by mouth 2 (two) times daily. 60 tablet 2  . fluticasone (CUTIVATE) 0.05 % cream Apply topically 2 (two) times daily. To rash 30 g  1  . hydrocortisone 2.5 % cream Apply topically 2 (two) times daily. To rash 30 g 2  . lacosamide (VIMPAT) 200 MG TABS tablet Take 1 tablet (200 mg total) by mouth 2 (two) times daily. (Patient not taking: Reported on 04/07/2020) 60 tablet 4  . lidocaine-prilocaine (EMLA) cream Apply 1 application topically as directed. Apply to port site 1 hour prior to stick and cover with plastic wrap 30 g 1  . LORazepam (ATIVAN) 0.5 MG tablet Take 1 tablet (0.5 mg total) by mouth 2 (two) times daily as needed for anxiety. 60 tablet 0  . magnesium oxide (MAG-OX) 400 (241.3 Mg) MG tablet Take 1 tablet (400 mg total) by mouth 2 (two) times daily. 60 tablet 2  . prochlorperazine (COMPAZINE) 10 MG tablet Take 1 tablet (10 mg total) by mouth every 6 (six) hours as needed for nausea or vomiting. 60 tablet 1   No current facility-administered medications on file prior to visit.    ALLERGIES: Allergies  Allergen Reactions  . Adhesive [Tape] Hives, Shortness Of Breath and Swelling    Tolerates paper tape  . Clindamycin/Lincomycin Itching and Rash    Generalized body rash with itching, lip ulcers  . Promethazine Other (See Comments)    Increase restless leg movement   . Eggs Or Egg-Derived Products Nausea And Vomiting  . Metoclopramide Hcl Other (See Comments)    EXACERBATES RESTLESS LEG SYNDROME  . Oxycodone-Acetaminophen Hives  . Sulfa Antibiotics Nausea And Vomiting  . Levaquin [Levofloxacin]     seizures  . Penicillins Itching    Has patient had a PCN reaction causing immediate rash, facial/tongue/throat swelling, SOB or lightheadedness with hypotension: No Has patient had a PCN reaction causing severe rash involving mucus membranes or skin necrosis: No Has patient had a PCN reaction that required hospitalization No Has patient had a PCN reaction occurring within the last 10 years: No If all of the above answers are "NO", then may proceed with Cephalosporin use.     FAMILY HISTORY: Family History   Problem Relation Age of Onset  . Colon polyps Mother        3+ colon polyps at each colonoscopy - "several"  . Colitis Mother   . Heart attack Mother   . Diabetes Mother   . Heart disease Mother   . Diabetes Father   . Heart attack Father   . Stroke Father   . Congestive Heart Failure Father   . Heart disease Father   . Cirrhosis Maternal Aunt   . Lung cancer Maternal Uncle        d. 17s; smoker and worked around asbestos  . Other Paternal Grandfather        "bone cancer"  . Colon cancer Maternal Uncle        dx early 11s  . Breast cancer Maternal Aunt        dx 50s-60s; s/p lumpectomy  . Dementia Maternal Aunt   . Other Cousin  maternal 1st cousin w/ "lung issues"  . Dementia Paternal Aunt   . Dementia Paternal Uncle   . Prostate cancer Paternal Uncle 106  . Esophageal cancer Neg Hx   . Rectal cancer Neg Hx   . Stomach cancer Neg Hx       Objective:  Blood pressure 129/80, pulse 93, height 5\' 6"  (1.676 m), weight 156 lb 3.2 oz (70.9 kg), SpO2 100 %. General: No acute distress.  Patient appears well-groomed.   Head:  Normocephalic/atraumatic Eyes:  Fundi examined but not visualized Neck: supple, no paraspinal tenderness, full range of motion Heart:  Regular rate and rhythm Lungs:  Clear to auscultation bilaterally Back: No paraspinal tenderness Neurological Exam: alert and oriented to person, place, and time. Attention span and concentration intact, recent and remote memory intact, fund of knowledge intact.  Speech fluent and not dysarthric, language intact.  CN II-XII intact. Bulk and tone normal, muscle strength 5/5 throughout.  Sensation to light touch  intact.  Deep tendon reflexes 2+ throughout, toes downgoing.  Finger to nose and heel to shin testing intact.  Gait normal, Romberg negative.     Metta Clines, DO  CC: Betsy Coder, MD

## 2020-04-15 ENCOUNTER — Ambulatory Visit: Payer: BC Managed Care – PPO | Admitting: Neurology

## 2020-04-15 ENCOUNTER — Encounter: Payer: Self-pay | Admitting: Neurology

## 2020-04-15 ENCOUNTER — Other Ambulatory Visit: Payer: Self-pay

## 2020-04-15 VITALS — BP 129/80 | HR 93 | Ht 66.0 in | Wt 156.2 lb

## 2020-04-15 DIAGNOSIS — G40309 Generalized idiopathic epilepsy and epileptic syndromes, not intractable, without status epilepticus: Secondary | ICD-10-CM

## 2020-04-15 MED ORDER — VIMPAT 100 MG PO TABS: ORAL_TABLET | ORAL | 0 refills | Status: DC

## 2020-04-15 MED ORDER — LACOSAMIDE 200 MG PO TABS: 200.0000 mg | ORAL_TABLET | Freq: Two times a day (BID) | ORAL | 5 refills | Status: DC

## 2020-04-15 MED ORDER — LACOSAMIDE 200 MG PO TABS: ORAL_TABLET | ORAL | 0 refills | Status: DC

## 2020-04-15 NOTE — Patient Instructions (Signed)
1. RESTART VIMPAT:  Take 100mg  twice daily for a week, then 200mg  twice daily.  If medication has been prescribed for you to prevent seizures, take it exactly as directed.  Do not stop taking the medicine without talking to your doctor first, even if you have not had a seizure in a long time.   2. Avoid activities in which a seizure would cause danger to yourself or to others.  Don't operate dangerous machinery, swim alone, or climb in high or dangerous places, such as on ladders, roofs, or girders.  Do not drive unless your doctor says you may.  3. If you have any warning that you may have a seizure, lay down in a safe place where you can't hurt yourself.    4.  No driving for 6 months from last seizure, as per [facility].   Please refer to the following link on the [place] website for more information: http://[email or web address]/[email or web address]/drivingu.cfm   5.  Maintain good sleep hygiene.  6.  Notify your neurology if you are planning pregnancy or if you become pregnant.  7.  Contact your doctor if you have any problems that may be related to the medicine you are taking.  8.  Call 911 and bring the patient back to the ED if:        A.  The seizure lasts longer than 5 minutes.       B.  The patient doesn't awaken shortly after the seizure  C.  The patient has new problems such as difficulty seeing, speaking or moving  D.  The patient was injured during the seizure  E.  The patient has a temperature over 102 F (39C)  F.  The patient vomited and now is having trouble breathing       9.  Follow up 6 months (if you are able to continue Vimpat, you may reschedule to one year follow up)

## 2020-04-23 ENCOUNTER — Other Ambulatory Visit: Payer: Self-pay | Admitting: Oncology

## 2020-04-25 ENCOUNTER — Other Ambulatory Visit: Payer: Self-pay | Admitting: Oncology

## 2020-04-25 DIAGNOSIS — C182 Malignant neoplasm of ascending colon: Secondary | ICD-10-CM

## 2020-04-27 ENCOUNTER — Other Ambulatory Visit: Payer: Self-pay | Admitting: Oncology

## 2020-04-27 DIAGNOSIS — C182 Malignant neoplasm of ascending colon: Secondary | ICD-10-CM

## 2020-04-28 ENCOUNTER — Inpatient Hospital Stay (HOSPITAL_BASED_OUTPATIENT_CLINIC_OR_DEPARTMENT_OTHER): Payer: BC Managed Care – PPO | Admitting: Nurse Practitioner

## 2020-04-28 ENCOUNTER — Other Ambulatory Visit: Payer: Self-pay

## 2020-04-28 ENCOUNTER — Inpatient Hospital Stay: Payer: BC Managed Care – PPO

## 2020-04-28 ENCOUNTER — Encounter: Payer: Self-pay | Admitting: Nurse Practitioner

## 2020-04-28 VITALS — BP 132/78 | HR 83 | Temp 98.1°F | Resp 20 | Ht 66.0 in | Wt 159.2 lb

## 2020-04-28 DIAGNOSIS — C182 Malignant neoplasm of ascending colon: Secondary | ICD-10-CM

## 2020-04-28 DIAGNOSIS — R21 Rash and other nonspecific skin eruption: Secondary | ICD-10-CM | POA: Diagnosis not present

## 2020-04-28 DIAGNOSIS — Z5112 Encounter for antineoplastic immunotherapy: Secondary | ICD-10-CM | POA: Diagnosis not present

## 2020-04-28 DIAGNOSIS — C7961 Secondary malignant neoplasm of right ovary: Secondary | ICD-10-CM | POA: Diagnosis not present

## 2020-04-28 DIAGNOSIS — Z5189 Encounter for other specified aftercare: Secondary | ICD-10-CM | POA: Diagnosis not present

## 2020-04-28 DIAGNOSIS — D509 Iron deficiency anemia, unspecified: Secondary | ICD-10-CM | POA: Diagnosis not present

## 2020-04-28 DIAGNOSIS — Z95828 Presence of other vascular implants and grafts: Secondary | ICD-10-CM

## 2020-04-28 DIAGNOSIS — C787 Secondary malignant neoplasm of liver and intrahepatic bile duct: Secondary | ICD-10-CM | POA: Diagnosis not present

## 2020-04-28 DIAGNOSIS — D6959 Other secondary thrombocytopenia: Secondary | ICD-10-CM | POA: Diagnosis not present

## 2020-04-28 DIAGNOSIS — Z5111 Encounter for antineoplastic chemotherapy: Secondary | ICD-10-CM | POA: Diagnosis not present

## 2020-04-28 LAB — MAGNESIUM: Magnesium: 1.7 mg/dL (ref 1.7–2.4)

## 2020-04-28 LAB — CBC WITH DIFFERENTIAL (CANCER CENTER ONLY)
Abs Immature Granulocytes: 0.03 10*3/uL (ref 0.00–0.07)
Basophils Absolute: 0 10*3/uL (ref 0.0–0.1)
Basophils Relative: 0 %
Eosinophils Absolute: 0.2 10*3/uL (ref 0.0–0.5)
Eosinophils Relative: 5 %
HCT: 36.5 % (ref 36.0–46.0)
Hemoglobin: 11.2 g/dL — ABNORMAL LOW (ref 12.0–15.0)
Immature Granulocytes: 1 %
Lymphocytes Relative: 18 %
Lymphs Abs: 0.9 10*3/uL (ref 0.7–4.0)
MCH: 24.4 pg — ABNORMAL LOW (ref 26.0–34.0)
MCHC: 30.7 g/dL (ref 30.0–36.0)
MCV: 79.5 fL — ABNORMAL LOW (ref 80.0–100.0)
Monocytes Absolute: 0.3 10*3/uL (ref 0.1–1.0)
Monocytes Relative: 6 %
Neutro Abs: 3.3 10*3/uL (ref 1.7–7.7)
Neutrophils Relative %: 70 %
Platelet Count: 150 10*3/uL (ref 150–400)
RBC: 4.59 MIL/uL (ref 3.87–5.11)
RDW: 17.6 % — ABNORMAL HIGH (ref 11.5–15.5)
WBC Count: 4.8 10*3/uL (ref 4.0–10.5)
nRBC: 0 % (ref 0.0–0.2)

## 2020-04-28 LAB — CMP (CANCER CENTER ONLY)
ALT: 47 U/L — ABNORMAL HIGH (ref 0–44)
AST: 31 U/L (ref 15–41)
Albumin: 3.7 g/dL (ref 3.5–5.0)
Alkaline Phosphatase: 140 U/L — ABNORMAL HIGH (ref 38–126)
Anion gap: 10 (ref 5–15)
BUN: 8 mg/dL (ref 6–20)
CO2: 25 mmol/L (ref 22–32)
Calcium: 8.9 mg/dL (ref 8.9–10.3)
Chloride: 108 mmol/L (ref 98–111)
Creatinine: 0.81 mg/dL (ref 0.44–1.00)
GFR, Estimated: >60 mL/min (ref >60)
Glucose, Bld: 292 mg/dL — ABNORMAL HIGH (ref 70–99)
Potassium: 4.1 mmol/L (ref 3.5–5.1)
Sodium: 143 mmol/L (ref 135–145)
Total Bilirubin: 0.5 mg/dL (ref 0.3–1.2)
Total Protein: 6.7 g/dL (ref 6.5–8.1)

## 2020-04-28 LAB — CEA (IN HOUSE-CHCC): CEA (CHCC-In House): 9.56 ng/mL — ABNORMAL HIGH (ref 0.00–5.00)

## 2020-04-28 MED ORDER — SODIUM CHLORIDE 0.9 % IV SOLN
10.0000 mg | Freq: Once | INTRAVENOUS | Status: AC
  Administered 2020-04-28: 10 mg via INTRAVENOUS
  Filled 2020-04-28: qty 10

## 2020-04-28 MED ORDER — ATROPINE SULFATE 1 MG/ML IJ SOLN
0.5000 mg | Freq: Once | INTRAMUSCULAR | Status: AC | PRN
  Administered 2020-04-28: 0.5 mg via INTRAVENOUS

## 2020-04-28 MED ORDER — SODIUM CHLORIDE 0.9 % IV SOLN
6.0000 mg/kg | Freq: Once | INTRAVENOUS | Status: AC
  Administered 2020-04-28: 400 mg via INTRAVENOUS
  Filled 2020-04-28: qty 20

## 2020-04-28 MED ORDER — SODIUM CHLORIDE 0.9% FLUSH
10.0000 mL | Freq: Once | INTRAVENOUS | Status: AC
Start: 2020-04-28 — End: 2020-04-28
  Administered 2020-04-28: 10 mL
  Filled 2020-04-28: qty 10

## 2020-04-28 MED ORDER — ATROPINE SULFATE 1 MG/ML IJ SOLN
INTRAMUSCULAR | Status: AC
  Filled 2020-04-28: qty 1

## 2020-04-28 MED ORDER — PALONOSETRON HCL INJECTION 0.25 MG/5ML
0.2500 mg | Freq: Once | INTRAVENOUS | Status: AC
  Administered 2020-04-28: 0.25 mg via INTRAVENOUS

## 2020-04-28 MED ORDER — SODIUM CHLORIDE 0.9 % IV SOLN
140.0000 mg/m2 | Freq: Once | INTRAVENOUS | Status: AC
  Administered 2020-04-28: 260 mg via INTRAVENOUS
  Filled 2020-04-28: qty 13

## 2020-04-28 MED ORDER — SODIUM CHLORIDE 0.9 % IV SOLN
Freq: Once | INTRAVENOUS | Status: AC
  Filled 2020-04-28: qty 250

## 2020-04-28 NOTE — Progress Notes (Signed)
[facility] OFFICE PROGRESS NOTE   Diagnosis: Colon cancer  INTERVAL HISTORY:   Ms. Rawles returns as scheduled.  She completed another cycle of irinotecan/Panitumumab 04/07/2020.  She denies nausea/vomiting.  No mouth sores.  Diarrhea improved when she "ran out" of doxycycline, recurred when she resumed doxycycline.  Rash continues to be improved.  She notes some cracks in the skin on her hands and fingers.  She is utilizing Neosporin and Band-Aids.  She is having difficulty sleeping.  Lorazepam is effective but she is concerned she will "run out" of the medication early.   Objective:  Vital signs in last 24 hours:  Blood pressure 132/78, pulse 83, temperature 98.1 F (36.7 C), temperature source Tympanic, resp. rate 20, height 5' 6"  (1.676 m), weight 159 lb 3.2 oz (72.2 kg), SpO2 100 %.    HEENT: No thrush or ulcers. Resp: Lungs clear bilaterally. Cardio: Regular rate and rhythm. GI: Abdomen soft and nontender.  No hepatomegaly. Vascular: No leg edema.  Skin: A few small linear cracks in the skin on her hands.  Mild dryness.  In general has a dry appearance.  Mild acne type rash on the face. Port-A-Cath without erythema.   Lab Results:  Lab Results  Component Value Date   WBC 4.8 04/28/2020   HGB 11.2 (L) 04/28/2020   HCT 36.5 04/28/2020   MCV 79.5 (L) 04/28/2020   PLT 150 04/28/2020   NEUTROABS 3.3 04/28/2020    Imaging:  No results found.  Medications: I have reviewed the patient's current medications.  Assessment/Plan: 1. 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  The tumor returned microsatellite stable with no loss of mismatch repair protein expression   APC mutated. No BRAF, KRAS, or NRAS mutation On Foundation 1 testing   Cycle 1 adjuvant FOLFOX 09/08/2013   Cycle 2 adjuvant FOLFOX 09/24/2013   Cycle 3 adjuvant FOLFOX 10/08/2013.    Cycle 4 adjuvant FOLFOX 10/22/2013.   Cycle 5 adjuvant FOLFOX 11/05/2013. Oxaliplatin held due to thrombocytopenia.  Cycle 6 FOLFOX 11/19/2013.  Cycle 7 FOLFOX 12/03/2013. Oxaliplatin held secondary to thrombocytopenia.  Cycle 8 FOLFOX 12/17/2013.  Cycle 9 FOLFOX 01/04/2014. Oxaliplatin held secondary to neutropenia.   Cycle 10 FOLFOX 01/21/2014. Oxaliplatin held secondary to thrombocytopenia.  Cycle 11 FOLFOX 02/04/2014  Cycle 12 FOLFOX 02/18/2014, oxaliplatin dose reduced secondary tothrombocytopenia  CT abdomen/pelvis 01/30/2014 revealed splenomegaly and no evidence of recurrent colon cancer  CT chest 04/07/2014 with a stable right lower lobe nodule and no evidence for metastatic disease, no nodules seen on the CT 11/26/2014  Markedly elevated CEA 11/24/2014  CT 11/26/2014 revealed a right pelvic mass, splenomegaly, small volume ascites  Right salpingo-oophorectomy 12/28/2014 with the pathology confirming metastatic colon cancer  CTs 03/23/2016-no evidence of recurrent or metastatic disease.  CT 11/26/2016-enlargement of a fluid density structure the right pelvic sidewall, no other evidence of metastatic disease  CT aspiration right pelvic cyst 12/19/2016.Cytology-BENIGN REACTIVE/REPARATIVE CHANGES.  CTs 06/05/2017-no evidence of metastatic disease, mild cirrhotic changes with splenomegaly  CTs 12/11/2017-recurrent cystic right adnexal mass, similar to on the CT 11/26/2016, stable mild splenomegaly  CTs 04/23/2018-enlargement of cystic right adnexal mass with mural nodularity, no other evidence of metastatic disease  Cytoreductive surgery/HIPEC with mitomycin by Dr. Clovis Riley at [facility] 08/12/2018-R1 resection achieved. Cytoreduction included omentectomy, LAR, right salpingo-oophorectomy and left colonic gutter/pelvic stripping. Pathology on the rectum showed recurrent/metastatic adenocarcinoma, tumor 2.0 cm, predominantly involving the subserosa and muscularis propria  of  the colon, proximal and distal margins of resection were negative, vascular invasion present, metastatic carcinoma present in 1 out of 5 lymph nodes; omentum resection with no malignancy seen, no metastatic carcinoma identified in 1 lymph node examined; left gutter stripping positive metastatic adenocarcinoma; right ovary resection positive metastatic adenocarcinoma.  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  CEA 68 02/03/2019  Biopsy liver lesion 02/11/2019-metastatic adenocarcinoma consistent with primary colonic adenocarcinoma  CTs 02/27/2019-multiple liver lesions increased in size, new lesion in the lateral right lobe of the liver. No evidence of metastatic disease in the chest. Redemonstrated moderate left hydronephrosis and proximal hydroureter without discrete lesion or obstructing etiology at the transition point of the mid ureter.  Cycle 1 FOLFIRI/Panitumumab 03/12/2019  Cycle 2 FOLFIRI/Panitumumab 03/26/2019-bolus 5-FU and irinotecan held secondary to neutropenia  Cycle 3 FOLFIRI/Panitumumab 04/09/2019, Udenyca added-not given secondary to seizure/discontinuation of the 5-FU pump  CT abdomen[facility]pelvis at [facility] 05/04/2019-no residual fluid collection at the left abdominal wall abscess, stable moderate left hydronephrosis, multifocal indeterminate liver lesions--liver lesionssignificantly improved  Cycle 4 FOLFIRI/Panitumumab 05/07/2019, Udenyca  Cycle 5 FOLFIRI/Panitumumab 05/20/2019, Udenyca  Cycle 6 FOLFIRI/Panitumumab 06/18/2019, Udenyca  Cycle 7 FOLFIRI/Panitumumab 07/01/2019 (Irinotecan dose reduced due to thrombocytopenia), Udenyca--Udenyca was not given  Cycle 8 FOLFIRI/Panitumumab 07/15/2019, Udenyca  Cycle 9 FOLFIRI/Panitumumab 07/29/2019, Udenyca  Cycle 10 FOLFIRI/Panitumumab 08/13/2019, Udenyca  CTs at [facility] 08/19/2019-decreased size of the hypodense and hyperdense lesions within segment 2 of  the left hepatic lobe. No new lesions identified. Similar presacral soft tissue thickening with adjacent stable alignment in the rectum. Improved but persistent left UPJ obstruction with mild left hydronephrosis. Persistent but decreased size of the wound within the left mid abdomen abdominal wall.  Cycle 11 irinotecan/Panitumumab 08/27/2019, Udenyca  Cycle 12 irinotecan/Panitumumab 10/08/2019, Udenyca  Cycle 13 irinotecan/Panitumumab 11/05/2019, Udenyca  CT abdomen/pelvis 11/19/2019-no new or progressive interval findings. Stable appearance of the calcified lesion in the anterior left hepatic dome with second tiny low-density lesion in the dome of the lateral segment left liver. Both lesions are markedly decreased since 02/27/2019. Stable mild fullness left intrarenal collecting system and renal pelvis. Similar appearance of abnormal soft tissue in the left paramidline subcutaneous fat with associated skin thickening, this likely correlates to the fistula.  Cycle 14 irinotecan/Panitumumab 11/19/2019, Udenyca  Cycle 15 irinotecan/Panitumumab 12/02/2019, Udenyca  CT abdomen[facility]pelvis at [facility] 02/11/2020-similar to slight decrease in segment 2 hypodense and hyperdense lesions (compared to a CT from July 2020), no new lesions, splenomegaly, no fluid collection or abscess, mild stranding adjacent to the incision with overlying skin thickening  Cycle 16 irinotecan/Panitumumab 03/03/2020, Udenyca  Cycle 17 irinotecan/Panitumumab 03/17/2020, Udenyca  Cycle 18 irinotecan/panitumumab 04/07/2020, Udenyca  Cycle 19 irinotecan/Panitumumab 04/28/2020, Udenyca 2. Mild elevation of the CEA beginning January 2016, normal on 05/19/2014   3. History of iron deficiency anemia  4. seizure disorder; seizure 04/09/2019. Brain CT negative. Now on Keppra. 5. history of depression  6. 4 mm right lower lobe nodule on a staging chest CT 09/08/2013 , stable on a CT 04/07/2014 7. Hospitalization 09/24/2013  through 09/26/2013 with fever and abdominal pain.  8. 09/24/2013 urine culture positive for coag negative staph.  9. 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 Splenomegaly noted on a CT scan 01/30/2014,persistent on repeat CTs 12. Colonoscopy 11/17/2018-flexible sigmoidoscopy per rectum with changes of mild diversion colitis. Scope advanced for approximately 25 cm. Most proximal portion had necrotic appearing debris. Colostomy bag insufflated suggesting  some type of communication between the pouch and the proximal colon. Introduction of scope into the ostomy found to available directions. 1 toward the distal pouch with similar appearing necrotic debris encountered. The other was about a 30 cm segment of normal-appearing colonic mucosa to the level of the previous right hemicolectomy ileocolonic anastomosis. 39. Port-A-Cath placement interventional radiology 03/05/2019 14. Left leg/foot weakness-brain MRI 09/03/2019 with no evidence of metastatic disease, referred to physical therapy 15. 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 No malignancy.  16. COVID-19 + 09/10/2019 17. Hospitalized with seizure 09/17/2019 through 09/19/2019-MRI brain without evidence of metastatic disease. Seen by neurology. Keppra dose increased. 19. Rash secondary to Panitumumab    Disposition: Ms. Ehrlich appears stable.   There is no clinical evidence of disease progression.  Plan to continue irinotecan/Panitumumab on a 3-week schedule.  Plan for restaging CTs in 6 to 8 weeks.  We reviewed the CBC from today.  Counts adequate to proceed with treatment.  The diarrhea may be related to doxycycline.  For now she will discontinue doxycycline.  If the rash worsens we will prescribe minocycline.  She will return for lab, follow-up, irinotecan/Panitumumab at a 4-week interval per her request due to a work conflict.    Ned Card ANP/GNP-BC   04/28/2020  11:10 AM

## 2020-04-28 NOTE — Patient Instructions (Signed)
[facility] Discharge Instructions for Patients Receiving Chemotherapy  Today you received the following immunotherapy agent: Panitumumab (Vectibix) and chemotherapy agent: Irinotecan  To help prevent nausea and vomiting after your treatment, we encourage you to take your nausea medication as directed by your MD.   If you develop nausea and vomiting that is not controlled by your nausea medication, call the clinic.   BELOW ARE SYMPTOMS THAT SHOULD BE REPORTED IMMEDIATELY:  *FEVER GREATER THAN 100.5 F  *CHILLS WITH OR WITHOUT FEVER  NAUSEA AND VOMITING THAT IS NOT CONTROLLED WITH YOUR NAUSEA MEDICATION  *UNUSUAL SHORTNESS OF BREATH  *UNUSUAL BRUISING OR BLEEDING  TENDERNESS IN MOUTH AND THROAT WITH OR WITHOUT PRESENCE OF ULCERS  *URINARY PROBLEMS  *BOWEL PROBLEMS  UNUSUAL RASH Items with * indicate a potential emergency and should be followed up as soon as possible.  Feel free to call the clinic should you have any questions or concerns. The clinic phone number is [phone].  Please show the [place] at check-in to the Emergency Department and triage nurse.

## 2020-04-30 ENCOUNTER — Other Ambulatory Visit: Payer: Self-pay

## 2020-04-30 ENCOUNTER — Inpatient Hospital Stay: Payer: BC Managed Care – PPO

## 2020-04-30 VITALS — BP 135/81 | HR 77 | Temp 98.2°F | Resp 17

## 2020-04-30 DIAGNOSIS — Z5111 Encounter for antineoplastic chemotherapy: Secondary | ICD-10-CM | POA: Diagnosis not present

## 2020-04-30 DIAGNOSIS — Z5189 Encounter for other specified aftercare: Secondary | ICD-10-CM | POA: Diagnosis not present

## 2020-04-30 DIAGNOSIS — C7961 Secondary malignant neoplasm of right ovary: Secondary | ICD-10-CM | POA: Diagnosis not present

## 2020-04-30 DIAGNOSIS — C182 Malignant neoplasm of ascending colon: Secondary | ICD-10-CM | POA: Diagnosis not present

## 2020-04-30 DIAGNOSIS — C787 Secondary malignant neoplasm of liver and intrahepatic bile duct: Secondary | ICD-10-CM | POA: Diagnosis not present

## 2020-04-30 DIAGNOSIS — D6959 Other secondary thrombocytopenia: Secondary | ICD-10-CM | POA: Diagnosis not present

## 2020-04-30 DIAGNOSIS — Z5112 Encounter for antineoplastic immunotherapy: Secondary | ICD-10-CM | POA: Diagnosis not present

## 2020-04-30 DIAGNOSIS — D509 Iron deficiency anemia, unspecified: Secondary | ICD-10-CM | POA: Diagnosis not present

## 2020-04-30 DIAGNOSIS — R21 Rash and other nonspecific skin eruption: Secondary | ICD-10-CM | POA: Diagnosis not present

## 2020-04-30 MED ORDER — PEGFILGRASTIM-CBQV 6 MG/0.6ML ~~LOC~~ SOSY
6.0000 mg | PREFILLED_SYRINGE | Freq: Once | SUBCUTANEOUS | Status: AC
  Administered 2020-04-30: 6 mg via SUBCUTANEOUS

## 2020-04-30 NOTE — Patient Instructions (Signed)
Pegfilgrastim injection What is this medicine? PEGFILGRASTIM (PEG fil gra stim) is a long-acting granulocyte colony-stimulating factor that stimulates the growth of neutrophils, a type of white blood cell important in the body's fight against infection. It is used to reduce the incidence of fever and infection in patients with certain types of cancer who are receiving chemotherapy that affects the bone marrow, and to increase survival after being exposed to high doses of radiation. This medicine may be used for other purposes; ask your health care provider or pharmacist if you have questions. COMMON BRAND NAME(S): Fulphila, Neulasta, Nyvepria, UDENYCA, Ziextenzo What should I tell my health care provider before I take this medicine? They need to know if you have any of these conditions:  kidney disease  latex allergy  ongoing radiation therapy  sickle cell disease  skin reactions to acrylic adhesives (On-Body Injector only)  an unusual or allergic reaction to pegfilgrastim, filgrastim, other medicines, foods, dyes, or preservatives  pregnant or trying to get pregnant  breast-feeding How should I use this medicine? This medicine is for injection under the skin. If you get this medicine at home, you will be taught how to prepare and give the pre-filled syringe or how to use the On-body Injector. Refer to the patient Instructions for Use for detailed instructions. Use exactly as directed. Tell your healthcare provider immediately if you suspect that the On-body Injector may not have performed as intended or if you suspect the use of the On-body Injector resulted in a missed or partial dose. It is important that you put your used needles and syringes in a special sharps container. Do not put them in a trash can. If you do not have a sharps container, call your pharmacist or healthcare provider to get one. Talk to your pediatrician regarding the use of this medicine in children. While this drug  may be prescribed for selected conditions, precautions do apply. Overdosage: If you think you have taken too much of this medicine contact a poison control center or emergency room at once. NOTE: This medicine is only for you. Do not share this medicine with others. What if I miss a dose? It is important not to miss your dose. Call your doctor or health care professional if you miss your dose. If you miss a dose due to an On-body Injector failure or leakage, a new dose should be administered as soon as possible using a single prefilled syringe for manual use. What may interact with this medicine? Interactions have not been studied. This list may not describe all possible interactions. Give your health care provider a list of all the medicines, herbs, non-prescription drugs, or dietary supplements you use. Also tell them if you smoke, drink alcohol, or use illegal drugs. Some items may interact with your medicine. What should I watch for while using this medicine? Your condition will be monitored carefully while you are receiving this medicine. You may need blood work done while you are taking this medicine. Talk to your health care provider about your risk of cancer. You may be more at risk for certain types of cancer if you take this medicine. If you are going to need a MRI, CT scan, or other procedure, tell your doctor that you are using this medicine (On-Body Injector only). What side effects may I notice from receiving this medicine? Side effects that you should report to your doctor or health care professional as soon as possible:  allergic reactions (skin rash, itching or hives, swelling of   the face, lips, or tongue)  back pain  dizziness  fever  pain, redness, or irritation at site where injected  pinpoint red spots on the skin  red or dark-[place] urine  shortness of breath or breathing problems  stomach or side pain, or pain at the shoulder  swelling  tiredness  trouble  passing urine or change in the amount of urine  unusual bruising or bleeding Side effects that usually do not require medical attention (report to your doctor or health care professional if they continue or are bothersome):  bone pain  muscle pain This list may not describe all possible side effects. Call your doctor for medical advice about side effects. You may report side effects to FDA at 1-800-FDA-1088. Where should I keep my medicine? Keep out of the reach of children. If you are using this medicine at home, you will be instructed on how to store it. Throw away any unused medicine after the expiration date on the label. NOTE: This sheet is a summary. It may not cover all possible information. If you have questions about this medicine, talk to your doctor, pharmacist, or health care provider.  2021 Elsevier/Gold Standard (2019-02-13 13:20:51)  

## 2020-05-19 ENCOUNTER — Ambulatory Visit: Payer: BC Managed Care – PPO

## 2020-05-19 ENCOUNTER — Other Ambulatory Visit: Payer: BC Managed Care – PPO

## 2020-05-19 ENCOUNTER — Ambulatory Visit: Payer: BC Managed Care – PPO | Admitting: Oncology

## 2020-05-26 ENCOUNTER — Inpatient Hospital Stay: Payer: BC Managed Care – PPO | Attending: Oncology

## 2020-05-26 ENCOUNTER — Inpatient Hospital Stay: Payer: BC Managed Care – PPO

## 2020-05-26 ENCOUNTER — Other Ambulatory Visit: Payer: Self-pay

## 2020-05-26 ENCOUNTER — Other Ambulatory Visit: Payer: Self-pay | Admitting: Oncology

## 2020-05-26 ENCOUNTER — Inpatient Hospital Stay (HOSPITAL_BASED_OUTPATIENT_CLINIC_OR_DEPARTMENT_OTHER): Payer: BC Managed Care – PPO | Admitting: Oncology

## 2020-05-26 ENCOUNTER — Telehealth: Payer: Self-pay | Admitting: Oncology

## 2020-05-26 ENCOUNTER — Encounter: Payer: Self-pay | Admitting: Oncology

## 2020-05-26 VITALS — BP 130/85 | HR 72 | Temp 97.7°F | Resp 18

## 2020-05-26 VITALS — BP 142/87 | HR 96 | Temp 97.8°F | Resp 18 | Ht 66.0 in | Wt 162.2 lb

## 2020-05-26 DIAGNOSIS — Z5112 Encounter for antineoplastic immunotherapy: Secondary | ICD-10-CM | POA: Insufficient documentation

## 2020-05-26 DIAGNOSIS — D509 Iron deficiency anemia, unspecified: Secondary | ICD-10-CM | POA: Insufficient documentation

## 2020-05-26 DIAGNOSIS — C787 Secondary malignant neoplasm of liver and intrahepatic bile duct: Secondary | ICD-10-CM | POA: Insufficient documentation

## 2020-05-26 DIAGNOSIS — C182 Malignant neoplasm of ascending colon: Secondary | ICD-10-CM

## 2020-05-26 DIAGNOSIS — Z5111 Encounter for antineoplastic chemotherapy: Secondary | ICD-10-CM | POA: Insufficient documentation

## 2020-05-26 DIAGNOSIS — L539 Erythematous condition, unspecified: Secondary | ICD-10-CM | POA: Insufficient documentation

## 2020-05-26 DIAGNOSIS — E119 Type 2 diabetes mellitus without complications: Secondary | ICD-10-CM | POA: Diagnosis not present

## 2020-05-26 DIAGNOSIS — Z5189 Encounter for other specified aftercare: Secondary | ICD-10-CM | POA: Diagnosis not present

## 2020-05-26 DIAGNOSIS — R21 Rash and other nonspecific skin eruption: Secondary | ICD-10-CM | POA: Diagnosis not present

## 2020-05-26 LAB — CMP (CANCER CENTER ONLY)
ALT: 33 U/L (ref 0–44)
AST: 29 U/L (ref 15–41)
Albumin: 3.9 g/dL (ref 3.5–5.0)
Alkaline Phosphatase: 137 U/L — ABNORMAL HIGH (ref 38–126)
Anion gap: 10 (ref 5–15)
BUN: 10 mg/dL (ref 6–20)
CO2: 24 mmol/L (ref 22–32)
Calcium: 9 mg/dL (ref 8.9–10.3)
Chloride: 103 mmol/L (ref 98–111)
Creatinine: 0.7 mg/dL (ref 0.44–1.00)
GFR, Estimated: >60 mL/min (ref >60)
Glucose, Bld: 410 mg/dL — ABNORMAL HIGH (ref 70–99)
Potassium: 4 mmol/L (ref 3.5–5.1)
Sodium: 137 mmol/L (ref 135–145)
Total Bilirubin: 0.6 mg/dL (ref 0.3–1.2)
Total Protein: 6.6 g/dL (ref 6.5–8.1)

## 2020-05-26 LAB — CBC WITH DIFFERENTIAL (CANCER CENTER ONLY)
Abs Immature Granulocytes: 0.01 10*3/uL (ref 0.00–0.07)
Basophils Absolute: 0 10*3/uL (ref 0.0–0.1)
Basophils Relative: 1 %
Eosinophils Absolute: 0.1 10*3/uL (ref 0.0–0.5)
Eosinophils Relative: 3 %
HCT: 36.2 % (ref 36.0–46.0)
Hemoglobin: 11.3 g/dL — ABNORMAL LOW (ref 12.0–15.0)
Immature Granulocytes: 0 %
Lymphocytes Relative: 15 %
Lymphs Abs: 0.7 10*3/uL (ref 0.7–4.0)
MCH: 24.5 pg — ABNORMAL LOW (ref 26.0–34.0)
MCHC: 31.2 g/dL (ref 30.0–36.0)
MCV: 78.4 fL — ABNORMAL LOW (ref 80.0–100.0)
Monocytes Absolute: 0.3 10*3/uL (ref 0.1–1.0)
Monocytes Relative: 6 %
Neutro Abs: 3.2 10*3/uL (ref 1.7–7.7)
Neutrophils Relative %: 75 %
Platelet Count: 166 10*3/uL (ref 150–400)
RBC: 4.62 MIL/uL (ref 3.87–5.11)
RDW: 16.4 % — ABNORMAL HIGH (ref 11.5–15.5)
WBC Count: 4.3 10*3/uL (ref 4.0–10.5)
nRBC: 0 % (ref 0.0–0.2)

## 2020-05-26 LAB — CEA (IN HOUSE-CHCC): CEA (CHCC-In House): 47.36 ng/mL — ABNORMAL HIGH (ref 0.00–5.00)

## 2020-05-26 LAB — MAGNESIUM: Magnesium: 1.7 mg/dL (ref 1.7–2.4)

## 2020-05-26 LAB — CEA (ACCESS): CEA (CHCC): 31.24 ng/mL — ABNORMAL HIGH (ref 0.00–5.00)

## 2020-05-26 MED ORDER — SODIUM CHLORIDE 0.9 % IV SOLN
140.0000 mg/m2 | Freq: Once | INTRAVENOUS | Status: AC
  Administered 2020-05-26: 260 mg via INTRAVENOUS
  Filled 2020-05-26: qty 13

## 2020-05-26 MED ORDER — HEPARIN SOD (PORK) LOCK FLUSH 100 UNIT/ML IV SOLN
500.0000 [IU] | Freq: Once | INTRAVENOUS | Status: AC | PRN
  Administered 2020-05-26: 500 [IU]
  Filled 2020-05-26: qty 5

## 2020-05-26 MED ORDER — SODIUM CHLORIDE 0.9 % IV SOLN
Freq: Once | INTRAVENOUS | Status: AC
  Filled 2020-05-26: qty 250

## 2020-05-26 MED ORDER — ATROPINE SULFATE 1 MG/ML IJ SOLN
0.5000 mg | Freq: Once | INTRAMUSCULAR | Status: AC | PRN
  Administered 2020-05-26: 0.5 mg via INTRAVENOUS
  Filled 2020-05-26: qty 1

## 2020-05-26 MED ORDER — SODIUM CHLORIDE 0.9 % IV SOLN
Freq: Once | INTRAVENOUS | Status: DC
  Filled 2020-05-26: qty 250

## 2020-05-26 MED ORDER — SODIUM CHLORIDE 0.9% FLUSH
10.0000 mL | INTRAVENOUS | Status: DC | PRN
  Administered 2020-05-26: 10 mL
  Filled 2020-05-26: qty 10

## 2020-05-26 MED ORDER — PALONOSETRON HCL INJECTION 0.25 MG/5ML
0.2500 mg | Freq: Once | INTRAVENOUS | Status: AC
  Administered 2020-05-26: 0.25 mg via INTRAVENOUS

## 2020-05-26 MED ORDER — SODIUM CHLORIDE 0.9 % IV SOLN
5.0000 mg | Freq: Once | INTRAVENOUS | Status: AC
  Administered 2020-05-26: 5 mg via INTRAVENOUS
  Filled 2020-05-26: qty 0.5

## 2020-05-26 MED ORDER — PALONOSETRON HCL INJECTION 0.25 MG/5ML
INTRAVENOUS | Status: AC
  Filled 2020-05-26: qty 5

## 2020-05-26 MED ORDER — SODIUM CHLORIDE 0.9% FLUSH
10.0000 mL | INTRAVENOUS | Status: DC | PRN
  Administered 2020-05-26: 10 mL via INTRAVENOUS
  Filled 2020-05-26: qty 10

## 2020-05-26 MED ORDER — SODIUM CHLORIDE 0.9 % IV SOLN
6.0000 mg/kg | Freq: Once | INTRAVENOUS | Status: AC
  Administered 2020-05-26: 400 mg via INTRAVENOUS
  Filled 2020-05-26: qty 20

## 2020-05-26 NOTE — Patient Instructions (Signed)
Return tomorrow for fasting blood glucose. Please call the clinic if you have any questions or concerns.

## 2020-05-26 NOTE — Telephone Encounter (Signed)
Appointments scheduled with patient in infusion per 4/21 los/ OK per verbal from Dr Benay Spice to schedule for 5/13 treatment and injection on 5/14

## 2020-05-26 NOTE — Progress Notes (Signed)
Pt here for vectibix and irinotecan.  Glucose 410.

## 2020-05-26 NOTE — Addendum Note (Signed)
Addended by: Betsy Coder B on: 05/26/2020 11:59 AM   Modules accepted: Orders

## 2020-05-26 NOTE — Progress Notes (Signed)
[place] OFFICE PROGRESS NOTE   Diagnosis: Colon cancer  INTERVAL HISTORY:   [name] returns as scheduled.  She was last treated with irinotecan and panitumumab on 04/28/2020.  She had mouth sores, but this did not interfere with the eating.  Diarrhea stopped when she discontinued doxycycline several days after chemotherapy.  No ulcerations at the fingers.  She has developed acne type lesions beneath the breast.  There is a larger erythematous lesion at the mid upper abdomen for the past week.  She continues working.  Objective:  Vital signs in last 24 hours:  Blood pressure (!) 142/87, pulse 96, temperature 97.8 F (36.6 C), temperature source Tympanic, resp. rate 18, height 5' 6"  (1.676 m), weight 162 lb 3.2 oz (73.6 kg), SpO2 100 %.    HEENT: No thrush, geographic tongue, no ulcers Resp: Lungs clear bilaterally Cardio: Regular rate and rhythm GI: No hepatomegaly, nontender Vascular: No leg edema  Skin: Few acne type lesions over the face and trunk, 3 cm area of induration at the mid upper abdomen with a central "head "and small surrounding ecchymosis  Portacath/PICC-without erythema  Lab Results:  Lab Results  Component Value Date   WBC 4.3 05/26/2020   HGB 11.3 (L) 05/26/2020   HCT 36.2 05/26/2020   MCV 78.4 (L) 05/26/2020   PLT 166 05/26/2020   NEUTROABS 3.2 05/26/2020    CMP  Lab Results  Component Value Date   NA 137 05/26/2020   K 4.0 05/26/2020   CL 103 05/26/2020   CO2 24 05/26/2020   GLUCOSE 410 (H) 05/26/2020   BUN 10 05/26/2020   CREATININE 0.70 05/26/2020   CALCIUM 9.0 05/26/2020   PROT 6.6 05/26/2020   ALBUMIN 3.9 05/26/2020   AST 29 05/26/2020   ALT 33 05/26/2020   ALKPHOS 137 (H) 05/26/2020   BILITOT 0.6 05/26/2020   GFRNONAA >60 05/26/2020   GFRAA >60 11/05/2019    Lab Results  Component Value Date   CEA1 9.56 (H) 04/28/2020    Medications: I have reviewed the patient's current  medications.   Assessment/Plan: 1. 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  The tumor returned microsatellite stable with no loss of mismatch repair protein expression   APC mutated. No BRAF, KRAS, or NRAS mutation On Foundation 1 testing   Cycle 1 adjuvant FOLFOX 09/08/2013   Cycle 2 adjuvant FOLFOX 09/24/2013   Cycle 3 adjuvant FOLFOX 10/08/2013.   Cycle 4 adjuvant FOLFOX 10/22/2013.   Cycle 5 adjuvant FOLFOX 11/05/2013. Oxaliplatin held due to thrombocytopenia.  Cycle 6 FOLFOX 11/19/2013.  Cycle 7 FOLFOX 12/03/2013. Oxaliplatin held secondary to thrombocytopenia.  Cycle 8 FOLFOX 12/17/2013.  Cycle 9 FOLFOX 01/04/2014. Oxaliplatin held secondary to neutropenia.   Cycle 10 FOLFOX 01/21/2014. Oxaliplatin held secondary to thrombocytopenia.  Cycle 11 FOLFOX 02/04/2014  Cycle 12 FOLFOX 02/18/2014, oxaliplatin dose reduced secondary tothrombocytopenia  CT abdomen/pelvis 01/30/2014 revealed splenomegaly and no evidence of recurrent colon cancer  CT chest 04/07/2014 with a stable right lower lobe nodule and no evidence for metastatic disease, no nodules seen on the CT 11/26/2014  Markedly elevated CEA 11/24/2014  CT 11/26/2014 revealed a right pelvic mass, splenomegaly, small volume ascites  Right salpingo-oophorectomy 12/28/2014 with the pathology confirming metastatic colon cancer  CTs 03/23/2016-no evidence of recurrent or metastatic disease.  CT 11/26/2016-enlargement of a fluid density structure the right pelvic sidewall, no other evidence of metastatic disease  CT aspiration right pelvic cyst  12/19/2016.Cytology-BENIGN REACTIVE/REPARATIVE CHANGES.  CTs 06/05/2017-no evidence of metastatic disease, mild cirrhotic changes with splenomegaly  CTs 12/11/2017-recurrent cystic right adnexal mass, similar to on the CT 11/26/2016, stable mild splenomegaly  CTs  04/23/2018-enlargement of cystic right adnexal mass with mural nodularity, no other evidence of metastatic disease  Cytoreductive surgery/HIPEC with mitomycin by Dr. Clovis Riley at [facility] 08/12/2018-R1 resection achieved. Cytoreduction included omentectomy, LAR, right salpingo-oophorectomy and left colonic gutter/pelvic stripping. Pathology on the rectum showed recurrent/metastatic adenocarcinoma, tumor 2.0 cm, predominantly involving the subserosa and muscularis propria of the colon, proximal and distal margins of resection were negative, vascular invasion present, metastatic carcinoma present in 1 out of 5 lymph nodes; omentum resection with no malignancy seen, no metastatic carcinoma identified in 1 lymph node examined; left gutter stripping positive metastatic adenocarcinoma; right ovary resection positive metastatic adenocarcinoma.  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  CEA 68 02/03/2019  Biopsy liver lesion 02/11/2019-metastatic adenocarcinoma consistent with primary colonic adenocarcinoma  CTs 02/27/2019-multiple liver lesions increased in size, new lesion in the lateral right lobe of the liver. No evidence of metastatic disease in the chest. Redemonstrated moderate left hydronephrosis and proximal hydroureter without discrete lesion or obstructing etiology at the transition point of the mid ureter.  Cycle 1 FOLFIRI/Panitumumab 03/12/2019  Cycle 2 FOLFIRI/Panitumumab 03/26/2019-bolus 5-FU and irinotecan held secondary to neutropenia  Cycle 3 FOLFIRI/Panitumumab 04/09/2019, Udenyca added-not given secondary to seizure/discontinuation of the 5-FU pump  CT abdomen/pelvis at [facility] 05/04/2019-no residual fluid collection at the left abdominal wall abscess, stable moderate left hydronephrosis, multifocal indeterminate liver lesions--liver lesionssignificantly improved  Cycle 4 FOLFIRI/Panitumumab 05/07/2019,  Udenyca  Cycle 5 FOLFIRI/Panitumumab 05/20/2019, Udenyca  Cycle 6 FOLFIRI/Panitumumab 06/18/2019, Udenyca  Cycle 7 FOLFIRI/Panitumumab 07/01/2019 (Irinotecan dose reduced due to thrombocytopenia), Udenyca--Udenyca was not given  Cycle 8 FOLFIRI/Panitumumab 07/15/2019, Udenyca  Cycle 9 FOLFIRI/Panitumumab 07/29/2019, Udenyca  Cycle 10 FOLFIRI/Panitumumab 08/13/2019, Udenyca  CTs at [facility] 08/19/2019-decreased size of the hypodense and hyperdense lesions within segment 2 of the left hepatic lobe. No new lesions identified. Similar presacral soft tissue thickening with adjacent stable alignment in the rectum. Improved but persistent left UPJ obstruction with mild left hydronephrosis. Persistent but decreased size of the wound within the left mid abdomen abdominal wall.  Cycle 11 irinotecan/Panitumumab 08/27/2019, Udenyca  Cycle 12 irinotecan/Panitumumab 10/08/2019, Udenyca  Cycle 13 irinotecan/Panitumumab 11/05/2019, Udenyca  CT abdomen/pelvis 11/19/2019-no new or progressive interval findings. Stable appearance of the calcified lesion in the anterior left hepatic dome with second tiny low-density lesion in the dome of the lateral segment left liver. Both lesions are markedly decreased since 02/27/2019. Stable mild fullness left intrarenal collecting system and renal pelvis. Similar appearance of abnormal soft tissue in the left paramidline subcutaneous fat with associated skin thickening, this likely correlates to the fistula.  Cycle 14 irinotecan/Panitumumab 11/19/2019, Udenyca  Cycle 15 irinotecan/Panitumumab 12/02/2019, Udenyca  CT abdomen/pelvis at [facility] 02/11/2020-similar to slight decrease in segment 2 hypodense and hyperdense lesions (compared to a CT from July 2020), no new lesions, splenomegaly, no fluid collection or abscess, mild stranding adjacent to the incision with overlying skin thickening  Cycle 16 irinotecan/Panitumumab 03/03/2020, Udenyca  Cycle 17  irinotecan/Panitumumab 03/17/2020, Udenyca  Cycle 18 irinotecan/panitumumab 04/07/2020, Udenyca  Cycle 19 irinotecan/Panitumumab 04/28/2020, Udenyca  Cycle 20 irinotecan/panitumumab 05/26/2020, Udenyca 2. Mild elevation of the CEA beginning January 2016, normal on 05/19/2014   3. History of iron deficiency anemia  4. seizure disorder; seizure 04/09/2019. Brain CT negative. Now on Keppra. 5. history  of depression  6. 4 mm right lower lobe nodule on a staging chest CT 09/08/2013 , stable on a CT 04/07/2014 7. Hospitalization 09/24/2013 through 09/26/2013 with fever and abdominal pain.  8. 09/24/2013 urine culture positive for coag negative staph.  9. 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 Splenomegaly noted on a CT scan 01/30/2014,persistent on repeat CTs 12. Colonoscopy 11/17/2018-flexible sigmoidoscopy per rectum with changes of mild diversion colitis. Scope advanced for approximately 25 cm. Most proximal portion had necrotic appearing debris. Colostomy bag insufflated suggesting some type of communication between the pouch and the proximal colon. Introduction of scope into the ostomy found to available directions. 1 toward the distal pouch with similar appearing necrotic debris encountered. The other was about a 30 cm segment of normal-appearing colonic mucosa to the level of the previous right hemicolectomy ileocolonic anastomosis. 5. Port-A-Cath placement interventional radiology 03/05/2019 14. Left leg/foot weakness-brain MRI 09/03/2019 with no evidence of metastatic disease, referred to physical therapy 15. 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 No malignancy.  16. COVID-19 + 09/10/2019 17. Hospitalized with seizure 09/17/2019 through 09/19/2019-MRI brain without evidence of metastatic disease. Seen by neurology. Keppra dose increased. 19. Rash secondary to Panitumumab     Disposition: Ms. Orebaugh appears stable.  She will complete another treatment with irinotecan/panitumumab today.  The area of erythema and induration at the mid upper abdomen is likely an inflamed lesion related to panitumumab versus an inflamed sebaceous cyst.  She will resume doxycycline for at least the next week.  She will contact us if this lesion enlarges or becomes more erythematous.  She will return for an office visit and chemotherapy in 3 weeks.  The blood sugar is elevated on the chemistry panel today.  She had "[employer] "in a sweet tea prior to arriving at the office today.  We will be her blood sugar prior to leaving the office today.  I recommend that she avoid concentrated sweets over the next few days.  Betsy Coder, MD  05/26/2020  10:55 AM

## 2020-05-26 NOTE — Addendum Note (Signed)
Addended by: Lenox Ponds E on: 05/26/2020 03:22 PM   Modules accepted: Orders

## 2020-05-26 NOTE — Progress Notes (Signed)
Will re-check blood sugar. New consent obtained for treatment.  Pt repeat blood sugar was 314. Pt is coming in the am for fasting blood sugar.  Vital signs stable prior to discharge.  Pt was stable during and after treatment.  Discharged in stable condition ambulatory.

## 2020-05-27 ENCOUNTER — Inpatient Hospital Stay: Payer: BC Managed Care – PPO

## 2020-05-27 ENCOUNTER — Telehealth: Payer: Self-pay | Admitting: *Deleted

## 2020-05-27 DIAGNOSIS — C182 Malignant neoplasm of ascending colon: Secondary | ICD-10-CM

## 2020-05-27 DIAGNOSIS — E119 Type 2 diabetes mellitus without complications: Secondary | ICD-10-CM | POA: Diagnosis not present

## 2020-05-27 DIAGNOSIS — Z5189 Encounter for other specified aftercare: Secondary | ICD-10-CM | POA: Diagnosis not present

## 2020-05-27 DIAGNOSIS — L539 Erythematous condition, unspecified: Secondary | ICD-10-CM | POA: Diagnosis not present

## 2020-05-27 DIAGNOSIS — C787 Secondary malignant neoplasm of liver and intrahepatic bile duct: Secondary | ICD-10-CM | POA: Diagnosis not present

## 2020-05-27 DIAGNOSIS — D509 Iron deficiency anemia, unspecified: Secondary | ICD-10-CM | POA: Diagnosis not present

## 2020-05-27 DIAGNOSIS — Z5111 Encounter for antineoplastic chemotherapy: Secondary | ICD-10-CM | POA: Diagnosis not present

## 2020-05-27 DIAGNOSIS — Z5112 Encounter for antineoplastic immunotherapy: Secondary | ICD-10-CM | POA: Diagnosis not present

## 2020-05-27 DIAGNOSIS — R21 Rash and other nonspecific skin eruption: Secondary | ICD-10-CM | POA: Diagnosis not present

## 2020-05-27 LAB — CMP (CANCER CENTER ONLY)
ALT: 35 U/L (ref 0–44)
AST: 24 U/L (ref 15–41)
Albumin: 4.3 g/dL (ref 3.5–5.0)
Alkaline Phosphatase: 142 U/L — ABNORMAL HIGH (ref 38–126)
Anion gap: 9 (ref 5–15)
BUN: 21 mg/dL — ABNORMAL HIGH (ref 6–20)
CO2: 24 mmol/L (ref 22–32)
Calcium: 9.7 mg/dL (ref 8.9–10.3)
Chloride: 102 mmol/L (ref 98–111)
Creatinine: 0.67 mg/dL (ref 0.44–1.00)
GFR, Estimated: >60 mL/min (ref >60)
Glucose, Bld: 283 mg/dL — ABNORMAL HIGH (ref 70–99)
Potassium: 4.3 mmol/L (ref 3.5–5.1)
Sodium: 135 mmol/L (ref 135–145)
Total Bilirubin: 1 mg/dL (ref 0.3–1.2)
Total Protein: 7.3 g/dL (ref 6.5–8.1)

## 2020-05-27 LAB — HEMOGLOBIN A1C
Hgb A1c MFr Bld: 8.6 % — ABNORMAL HIGH (ref 4.8–5.6)
Mean Plasma Glucose: 200.12 mg/dL

## 2020-05-27 NOTE — Telephone Encounter (Signed)
Patient returned [employer] and agrees to labs 2 days early and for primary care referral to practice at [place]. Office is closed today. Will contact office on Monday. Scheduling message sent.

## 2020-05-27 NOTE — Telephone Encounter (Addendum)
Per Dr. Benay Spice: Elevated glucose not causing increase in CEA. Not a perfect marker, can increase due to cancer or linked to inflammation (does have an area on abdomen that can be inflammatory). Suggests her labs be done 2 days before next treatment is due before any change in treatment or timing. Also needs a PCP to manage her diabetes. Left VM for patient to call back to discuss MD response to her Mychart inquiry. Patient called back and nurse returned call and left VM again. Also sent MD response to her Mychart.

## 2020-05-28 ENCOUNTER — Other Ambulatory Visit: Payer: Self-pay

## 2020-05-28 ENCOUNTER — Inpatient Hospital Stay: Payer: BC Managed Care – PPO

## 2020-05-28 VITALS — BP 123/80 | HR 87 | Temp 99.0°F

## 2020-05-28 DIAGNOSIS — L539 Erythematous condition, unspecified: Secondary | ICD-10-CM | POA: Diagnosis not present

## 2020-05-28 DIAGNOSIS — E119 Type 2 diabetes mellitus without complications: Secondary | ICD-10-CM | POA: Diagnosis not present

## 2020-05-28 DIAGNOSIS — Z5189 Encounter for other specified aftercare: Secondary | ICD-10-CM | POA: Diagnosis not present

## 2020-05-28 DIAGNOSIS — C787 Secondary malignant neoplasm of liver and intrahepatic bile duct: Secondary | ICD-10-CM | POA: Diagnosis not present

## 2020-05-28 DIAGNOSIS — D509 Iron deficiency anemia, unspecified: Secondary | ICD-10-CM | POA: Diagnosis not present

## 2020-05-28 DIAGNOSIS — Z5111 Encounter for antineoplastic chemotherapy: Secondary | ICD-10-CM | POA: Diagnosis not present

## 2020-05-28 DIAGNOSIS — Z5112 Encounter for antineoplastic immunotherapy: Secondary | ICD-10-CM | POA: Diagnosis not present

## 2020-05-28 DIAGNOSIS — R21 Rash and other nonspecific skin eruption: Secondary | ICD-10-CM | POA: Diagnosis not present

## 2020-05-28 DIAGNOSIS — C182 Malignant neoplasm of ascending colon: Secondary | ICD-10-CM | POA: Diagnosis not present

## 2020-05-28 MED ORDER — PEGFILGRASTIM-CBQV 6 MG/0.6ML ~~LOC~~ SOSY
6.0000 mg | PREFILLED_SYRINGE | Freq: Once | SUBCUTANEOUS | Status: AC
  Administered 2020-05-28: 6 mg via SUBCUTANEOUS

## 2020-05-28 NOTE — Patient Instructions (Signed)
Pegfilgrastim injection What is this medicine? PEGFILGRASTIM (PEG fil gra stim) is a long-acting granulocyte colony-stimulating factor that stimulates the growth of neutrophils, a type of white blood cell important in the body's fight against infection. It is used to reduce the incidence of fever and infection in patients with certain types of cancer who are receiving chemotherapy that affects the bone marrow, and to increase survival after being exposed to high doses of radiation. This medicine may be used for other purposes; ask your health care provider or pharmacist if you have questions. COMMON BRAND NAME(S): Fulphila, Neulasta, Nyvepria, UDENYCA, Ziextenzo What should I tell my health care provider before I take this medicine? They need to know if you have any of these conditions:  kidney disease  latex allergy  ongoing radiation therapy  sickle cell disease  skin reactions to acrylic adhesives (On-Body Injector only)  an unusual or allergic reaction to pegfilgrastim, filgrastim, other medicines, foods, dyes, or preservatives  pregnant or trying to get pregnant  breast-feeding How should I use this medicine? This medicine is for injection under the skin. If you get this medicine at home, you will be taught how to prepare and give the pre-filled syringe or how to use the On-body Injector. Refer to the patient Instructions for Use for detailed instructions. Use exactly as directed. Tell your healthcare provider immediately if you suspect that the On-body Injector may not have performed as intended or if you suspect the use of the On-body Injector resulted in a missed or partial dose. It is important that you put your used needles and syringes in a special sharps container. Do not put them in a trash can. If you do not have a sharps container, call your pharmacist or healthcare provider to get one. Talk to your pediatrician regarding the use of this medicine in children. While this drug  may be prescribed for selected conditions, precautions do apply. Overdosage: If you think you have taken too much of this medicine contact a poison control center or emergency room at once. NOTE: This medicine is only for you. Do not share this medicine with others. What if I miss a dose? It is important not to miss your dose. Call your doctor or health care professional if you miss your dose. If you miss a dose due to an On-body Injector failure or leakage, a new dose should be administered as soon as possible using a single prefilled syringe for manual use. What may interact with this medicine? Interactions have not been studied. This list may not describe all possible interactions. Give your health care provider a list of all the medicines, herbs, non-prescription drugs, or dietary supplements you use. Also tell them if you smoke, drink alcohol, or use illegal drugs. Some items may interact with your medicine. What should I watch for while using this medicine? Your condition will be monitored carefully while you are receiving this medicine. You may need blood work done while you are taking this medicine. Talk to your health care provider about your risk of cancer. You may be more at risk for certain types of cancer if you take this medicine. If you are going to need a MRI, CT scan, or other procedure, tell your doctor that you are using this medicine (On-Body Injector only). What side effects may I notice from receiving this medicine? Side effects that you should report to your doctor or health care professional as soon as possible:  allergic reactions (skin rash, itching or hives, swelling of   the face, lips, or tongue)  back pain  dizziness  fever  pain, redness, or irritation at site where injected  pinpoint red spots on the skin  red or dark-Jollie urine  shortness of breath or breathing problems  stomach or side pain, or pain at the shoulder  swelling  tiredness  trouble  passing urine or change in the amount of urine  unusual bruising or bleeding Side effects that usually do not require medical attention (report to your doctor or health care professional if they continue or are bothersome):  bone pain  muscle pain This list may not describe all possible side effects. Call your doctor for medical advice about side effects. You may report side effects to FDA at 1-800-FDA-1088. Where should I keep my medicine? Keep out of the reach of children. If you are using this medicine at home, you will be instructed on how to store it. Throw away any unused medicine after the expiration date on the label. NOTE: This sheet is a summary. It may not cover all possible information. If you have questions about this medicine, talk to your doctor, pharmacist, or health care provider.  2021 Elsevier/Gold Standard (2019-02-13 13:20:51)  

## 2020-05-30 ENCOUNTER — Telehealth: Payer: Self-pay | Admitting: *Deleted

## 2020-05-30 ENCOUNTER — Encounter: Payer: Self-pay | Admitting: Nurse Practitioner

## 2020-05-30 NOTE — Telephone Encounter (Signed)
Called primary care office at [facility] and obtained appointment w/Dr. [name] at 0915/0930 to establish primary care w/new diagnosis of diabetes. Called left patient VM w/appointment date/time and request to call to confirm.

## 2020-05-31 ENCOUNTER — Telehealth: Payer: Self-pay | Admitting: *Deleted

## 2020-05-31 ENCOUNTER — Telehealth: Payer: Self-pay | Admitting: Nutrition

## 2020-05-31 ENCOUNTER — Other Ambulatory Visit: Payer: Self-pay

## 2020-05-31 ENCOUNTER — Encounter: Payer: Self-pay | Admitting: Emergency Medicine

## 2020-05-31 ENCOUNTER — Ambulatory Visit
Admission: EM | Admit: 2020-05-31 | Discharge: 2020-05-31 | Disposition: A | Discharge status: Home / Self Care | Payer: BC Managed Care – PPO | Attending: Emergency Medicine | Admitting: Emergency Medicine

## 2020-05-31 DIAGNOSIS — R300 Dysuria: Secondary | ICD-10-CM | POA: Diagnosis not present

## 2020-05-31 LAB — POCT URINALYSIS DIP (MANUAL ENTRY)
Bilirubin, UA: NEGATIVE
Glucose, UA: >=1000 mg/dL — AB
Ketones, POC UA: NEGATIVE mg/dL
Nitrite, UA: NEGATIVE
Protein Ur, POC: 30 mg/dL — AB
Spec Grav, UA: <=1.005 — AB (ref 1.010–1.025)
Urobilinogen, UA: 0.2 E.U./dL
pH, UA: 5.5 (ref 5.0–8.0)

## 2020-05-31 MED ORDER — NITROFURANTOIN MONOHYD MACRO 100 MG PO CAPS: 100.0000 mg | ORAL_CAPSULE | Freq: Two times a day (BID) | ORAL | 0 refills | Status: DC

## 2020-05-31 MED ORDER — PHENAZOPYRIDINE HCL 200 MG PO TABS: 200.0000 mg | ORAL_TABLET | Freq: Three times a day (TID) | ORAL | 0 refills | Status: DC

## 2020-05-31 NOTE — ED Triage Notes (Signed)
Pain on urination states she has blisters in groin area x 4 days.   States she has tried [employer] fast streamer with no relief and neosporin.  Also c/o urinary frequency.

## 2020-05-31 NOTE — Telephone Encounter (Signed)
Scheduled appt per 4/25sch msg. Pt aware.  

## 2020-05-31 NOTE — ED Provider Notes (Signed)
[facility]   CC: Burning with urination  SUBJECTIVE:  [name] is a 45 y.o. female who complains of painful urination and frequency x 4 days.  Is undergoing chemotherapy and has blisters in her groin related to chemotherapy.  Has tried OTC medications without relief.  Symptoms are made worse with urination.  Admits to similar symptoms in the past.  Denies fever, chills, nausea, vomiting, abdominal pain, flank pain, abnormal vaginal discharge or bleeding, hematuria.    LMP: No LMP recorded. Patient has had a hysterectomy.  ROS: As in HPI.  All other pertinent ROS negative.     Past Medical History:  Diagnosis Date  . ADD (attention deficit disorder)   . Anemia, iron deficiency   . Anxiety   . Colon cancer ([place]) 08/2013  . Depression   . Difficulty swallowing pills   . GERD (gastroesophageal reflux disease)   . Headache    migraines "long time ago"  . History of blood transfusion 08/16/2013  . History of migraine    no problems in "a long time"  . 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 Restless leg syndrome   . Seizures ([place])    last seizure 08/2012   Past Surgical History:  Procedure Laterality Date  . ABDOMINAL HYSTERECTOMY  08/24/2004   partial  . APPENDECTOMY  08/24/2004  . COLON SURGERY  08/11/2013   removed a foot of colon  . COLONOSCOPY  2019  . colonoscopy 10-18-14    . COLONOSCOPY WITH PROPOFOL  07/07/2013  . CYSTOSCOPY  11/01/2003  . ENDOMETRIAL FULGURATION  11/01/2003  . ESOPHAGOGASTRODUODENOSCOPY  07/07/2013  . IR IMAGING GUIDED PORT INSERTION  03/05/2019  . LAPAROSCOPIC LYSIS OF ADHESIONS  11/01/2003  . LAPAROSCOPIC PARTIAL COLECTOMY Right 08/11/2013   Procedure: LAPAROSCOPIC RIGHT HEMICOLECTOMY;  Surgeon: Stark Klein, MD;  Location: [place];  Service: General;  Laterality: Right;  . LAPAROTOMY N/A 12/28/2014   Procedure: EXPLORATORY LAPAROTOMY;  Surgeon: Everitt Amber, MD;  Location: [facility];  Service: Gynecology;  Laterality: N/A;  .  LEFT OOPHORECTOMY  08/24/2004  . PANNICULECTOMY  08/24/2004  . PORT-A-CATH REMOVAL Left 06/08/2014   Procedure: REMOVAL PORT-A-CATH;  Surgeon: Stark Klein, MD;  Location: [place];  Service: General;  Laterality: Left;  . PORTACATH PLACEMENT Left 09/09/2013   Procedure: INSERTION PORT-A-CATH;  Surgeon: Stark Klein, MD;  Location: [place];  Service: General;  Laterality: Left;  . SALPINGOOPHORECTOMY Right 12/28/2014   Procedure: RIGHT SALPINGO OOPHORECTOMY;  Surgeon: Everitt Amber, MD;  Location: [facility];  Service: Gynecology;  Laterality: Right;  . TUBAL LIGATION  11/07/2000  . UNILATERAL SALPINGECTOMY Left 08/24/2004  . WISDOM TOOTH EXTRACTION Bilateral 1994   Allergies  Allergen Reactions  . Adhesive [Tape] Hives, Shortness Of Breath and Swelling    Tolerates paper tape  . Clindamycin/Lincomycin Itching and Rash    Generalized body rash with itching, lip ulcers  . Promethazine Other (See Comments)    Increase restless leg movement   . Eggs Or Egg-Derived Products Nausea And Vomiting  . Metoclopramide Hcl Other (See Comments)    EXACERBATES RESTLESS LEG SYNDROME  . Oxycodone-Acetaminophen Hives  . Sulfa Antibiotics Nausea And Vomiting  . Levaquin [Levofloxacin]     seizures  . Penicillins Itching    Has patient had a PCN reaction causing immediate rash, facial/tongue/throat swelling, SOB or lightheadedness with hypotension: No Has patient had a PCN reaction causing severe rash involving mucus membranes or skin necrosis: No Has patient had a  PCN reaction that required hospitalization No Has patient had a PCN reaction occurring within the last 10 years: No If all of the above answers are "NO", then may proceed with Cephalosporin use.    No current facility-administered medications on file prior to encounter.   Current Outpatient Medications on File Prior to Encounter  Medication Sig Dispense Refill  . acetaminophen (TYLENOL) 500 MG tablet Take 1,000 mg by mouth every 6  (six) hours as needed.    . diphenoxylate-atropine (LOMOTIL) 2.5-0.025 MG tablet TAKE 1- 2 TABLETS BY MOUTH 4 TIMES DAILY AS NEEDED FOR DIARRHEA /  LOOSE  STOOLS 60 tablet 1  . doxycycline (VIBRA-TABS) 100 MG tablet Take 1 tablet (100 mg total) by mouth 2 (two) times daily. (Patient not taking: Reported on 05/26/2020) 60 tablet 2  . fluticasone (CUTIVATE) 0.05 % cream Apply topically 2 (two) times daily. To rash 30 g 1  . hydrocortisone 2.5 % cream Apply topically 2 (two) times daily. To rash 30 g 2  . Lacosamide (VIMPAT) 100 MG TABS Take two tablets daily for one week. Samples of this drug were given to the patient, quantity 1 box (14 tablets), Lot Number #[id number] 14 tablet 0  . lacosamide (VIMPAT) 200 MG TABS tablet Take 1 tablet (200 mg total) by mouth 2 (two) times daily. 60 tablet 5  . lacosamide (VIMPAT) 200 MG TABS tablet Samples of this drug were given to the patient, quantity 3 boxes (14 tablets in each box), Lot Number#: [id number] exp:9/25 14 tablet 0  . lidocaine-prilocaine (EMLA) cream Apply 1 application topically as directed. Apply to port site 1 hour prior to stick and cover with plastic wrap 30 g 1  . LORazepam (ATIVAN) 0.5 MG tablet Take 1 tablet by mouth twice daily as needed for anxiety 60 tablet 0  . magnesium oxide (MAG-OX) 400 (241.3 Mg) MG tablet Take 1 tablet (400 mg total) by mouth 2 (two) times daily. 60 tablet 2  . prochlorperazine (COMPAZINE) 10 MG tablet Take 1 tablet (10 mg total) by mouth every 6 (six) hours as needed for nausea or vomiting. 60 tablet 1   Social History   Socioeconomic History  . Marital status: Married    Spouse name: Not on file  . Number of children: 2  . Years of education: Not on file  . Highest education level: Not on file  Occupational History  . Occupation: Unemployed- full time student    Comment: Completed 12th grade  Tobacco Use  . Smoking status: Never Smoker  . Smokeless tobacco: Never Used  Vaping Use  . Vaping Use: Never used   Substance and Sexual Activity  . Alcohol use: No  . Drug use: No  . Sexual activity: Yes    Birth control/protection: Surgical  Other Topics Concern  . Not on file  Social History Narrative   Right handed    Social Determinants of Health   Financial Resource Strain: Not on file  Food Insecurity: Not on file  Transportation Needs: Not on file  Physical Activity: Not on file  Stress: Not on file  Social Connections: Not on file  Intimate Partner Violence: Not on file   Family History  Problem Relation Age of Onset  . Colon polyps Mother        3+ colon polyps at each colonoscopy - "several"  . Colitis Mother   . Heart attack Mother   . Diabetes Mother   . Heart disease Mother   . Diabetes Father   .  Heart attack Father   . Stroke Father   . Congestive Heart Failure Father   . Heart disease Father   . Cirrhosis Maternal Aunt   . Lung cancer Maternal Uncle        d. 23s; smoker and worked around asbestos  . Other Paternal Grandfather        "bone cancer"  . Colon cancer Maternal Uncle        dx early 75s  . Breast cancer Maternal Aunt        dx 50s-60s; s/p lumpectomy  . Dementia Maternal Aunt   . Other Cousin        maternal 1st cousin w/ "lung issues"  . Dementia Paternal Aunt   . Dementia Paternal Uncle   . Prostate cancer Paternal Uncle 68  . Esophageal cancer Neg Hx   . Rectal cancer Neg Hx   . Stomach cancer Neg Hx     OBJECTIVE:  Vitals:   05/31/20 1651  BP: 128/88  Pulse: 95  Resp: 18  Temp: 99.7 F (37.6 C)  TempSrc: Oral  SpO2: 100%   General appearance: AOx3; appears uncomfortable, crying in exam chair upon entering room HEENT: NCAT.  Oropharynx clear.  Lungs: clear to auscultation bilaterally without adventitious breath sounds Heart: regular rate and rhythm.   Abdomen: soft; non-distended; no tenderness; bowel sounds present; no guarding Back: no CVA tenderness Extremities: no edema; symmetrical with no gross deformities Skin: warm and  dry Neurologic: Ambulates from chair to exam table without difficulty Psychological: alert and cooperative; normal mood and affect  Labs Reviewed  POCT URINALYSIS DIP (MANUAL ENTRY) - Abnormal; Notable for the following components:      Result Value   Color, UA straw (*)    Clarity, UA hazy (*)    Glucose, UA >=1,000 (*)    Spec Grav, UA <=1.005 (*)    Blood, UA moderate (*)    Protein Ur, POC =30 (*)    Leukocytes, UA Trace (*)    All other components within normal limits  URINE CULTURE    ASSESSMENT & PLAN:  1. Dysuria     Meds ordered this encounter  Medications  . nitrofurantoin, macrocrystal-monohydrate, (MACROBID) 100 MG capsule    Sig: Take 1 capsule (100 mg total) by mouth 2 (two) times daily.    Dispense:  10 capsule    Refill:  0    Order Specific Question:   Supervising Provider    Answer:   Raylene Everts [[id number]]  . phenazopyridine (PYRIDIUM) 200 MG tablet    Sig: Take 1 tablet (200 mg total) by mouth 3 (three) times daily.    Dispense:  6 tablet    Refill:  0    Order Specific Question:   Supervising Provider    Answer:   Raylene Everts [[id number]]    Urine culture sent.  We will call you with the results.   Push fluids and get plenty of rest.   Take antibiotic as directed and to completion Take pyridium as prescribed and as needed for symptomatic relief Follow up with PCP if symptoms persists Return here or go to ER if you have any new or worsening symptoms such as fever, worsening abdominal pain, nausea/vomiting, flank pain, etc...  Outlined signs and symptoms indicating need for more acute intervention. Patient verbalized understanding. After Visit Summary given.     Lestine Box, PA-C 05/31/20 1750

## 2020-05-31 NOTE — Discharge Instructions (Signed)
Urine culture sent.  We will call you with the results.   Push fluids and get plenty of rest.   Take antibiotic as directed and to completion Take pyridium as prescribed and as needed for symptomatic relief Follow up with PCP if symptoms persists Return here or go to ER if you have any new or worsening symptoms such as fever, worsening abdominal pain, nausea/vomiting, flank pain, etc... 

## 2020-05-31 NOTE — Telephone Encounter (Signed)
Called to report she has blistered areas at vaginal area and is in severe pain. Informed her that both providers are not in office. Suggested she go to Urgent care. She will to to the [facility] in [place].

## 2020-06-02 ENCOUNTER — Ambulatory Visit (INDEPENDENT_AMBULATORY_CARE_PROVIDER_SITE_OTHER): Payer: BC Managed Care – PPO | Admitting: Family Medicine

## 2020-06-02 ENCOUNTER — Other Ambulatory Visit: Payer: Self-pay

## 2020-06-02 ENCOUNTER — Encounter (HOSPITAL_BASED_OUTPATIENT_CLINIC_OR_DEPARTMENT_OTHER): Payer: Self-pay | Admitting: Family Medicine

## 2020-06-02 ENCOUNTER — Encounter: Payer: Self-pay | Admitting: Nurse Practitioner

## 2020-06-02 ENCOUNTER — Inpatient Hospital Stay (HOSPITAL_BASED_OUTPATIENT_CLINIC_OR_DEPARTMENT_OTHER): Payer: BC Managed Care – PPO | Admitting: Nurse Practitioner

## 2020-06-02 ENCOUNTER — Inpatient Hospital Stay: Payer: BC Managed Care – PPO | Admitting: Nutrition

## 2020-06-02 VITALS — BP 132/70 | HR 87 | Ht 66.0 in | Wt 154.2 lb

## 2020-06-02 VITALS — BP 129/80 | HR 78 | Temp 98.1°F | Resp 20 | Ht 66.0 in | Wt 155.2 lb

## 2020-06-02 DIAGNOSIS — C182 Malignant neoplasm of ascending colon: Secondary | ICD-10-CM | POA: Diagnosis not present

## 2020-06-02 DIAGNOSIS — C787 Secondary malignant neoplasm of liver and intrahepatic bile duct: Secondary | ICD-10-CM

## 2020-06-02 DIAGNOSIS — E119 Type 2 diabetes mellitus without complications: Secondary | ICD-10-CM | POA: Insufficient documentation

## 2020-06-02 DIAGNOSIS — N3001 Acute cystitis with hematuria: Secondary | ICD-10-CM

## 2020-06-02 DIAGNOSIS — N390 Urinary tract infection, site not specified: Secondary | ICD-10-CM | POA: Insufficient documentation

## 2020-06-02 LAB — POCT UA - MICROALBUMIN
Albumin/Creatinine Ratio, Urine, POC: <30
Creatinine, POC: 300 mg/dL
Microalbumin Ur, POC: 30 mg/L

## 2020-06-02 LAB — URINE CULTURE: Culture: <10000 — AB

## 2020-06-02 MED ORDER — METFORMIN HCL 500 MG PO TABS: ORAL_TABLET | ORAL | 3 refills | Status: DC

## 2020-06-02 MED ORDER — VALACYCLOVIR HCL 1 G PO TABS: 1000.0000 mg | ORAL_TABLET | Freq: Two times a day (BID) | ORAL | 0 refills | Status: AC

## 2020-06-02 MED ORDER — OXYCODONE HCL 5 MG PO TABS: 5.0000 mg | ORAL_TABLET | Freq: Four times a day (QID) | ORAL | 0 refills | Status: DC | PRN

## 2020-06-02 NOTE — Progress Notes (Signed)
New Patient Office Visit  Subjective:  Patient ID: [name], female    DOB: 1975/05/30  Age: 45 y.o. MRN: [id number]  CC:  Chief Complaint  Patient presents with  . Establish Care  . Urinary Tract Infection    Patient has a current UTI that she is being treated with Pyridium and Macrobid    HPI [name] is a 45 year old female presenting to establish in clinic.  Appointment primarily made at recommendation of her oncologist office due to elevated hemoglobin A1c.  Patient also with current concerns related to UTI.  Past medical history significant for colon cancer, anxiety/depression, iron deficiency.  Elevated hemoglobin A1c: Recent lab work with oncologist revealed hemoglobin A1c of 8.6%.  Patient is aware that she had prediabetes in the past, indicating that during that time she also had an elevated BMI when A1c was within prediabetes range.  Chart review indicates hemoglobin A1c that was around 6.0% about 6 to 8 years ago.  She does endorse recent polydipsia and polyuria.  Chart review indicates prior treatment with metformin, although in discussing with patient today she does not recall this medication.  UTI: Was experiencing recent urinary symptoms, primarily dysuria.  She was seen at an urgent care and was prescribed antibiotic as well as Pyridium.  She has been taking these medications in order to treat her symptoms.  Presently denies any significant abdominal pain, no fevers, chills or night sweats.  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  Patient underwent adjuvant chemotherapy following surgery.  In 2016, patient was found to have metastatic colon cancer with evidence of spread into right ovary with right salpingo-oophorectomy completed in November 2016.  Subsequently, patient was also found to have metastatic lesion in the liver in 2021.  She is currently in process of completing cycles of chemotherapy.   Follows regularly with oncology with labs also completed intermittently with oncology.  Past Medical History:  Diagnosis Date  . ADD (attention deficit disorder)   . Anemia, iron deficiency   . Anxiety   . Colon cancer ([place]) 08/2013  . Depression   . Difficulty swallowing pills   . GERD (gastroesophageal reflux disease)   . Headache    migraines "long time ago"  . History of blood transfusion 08/16/2013  . History of migraine    no problems in "a long time"  . 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 Restless leg syndrome   . Seizures ([place])    last seizure 08/2012    Past Surgical History:  Procedure Laterality Date  . ABDOMINAL HYSTERECTOMY  08/24/2004   partial  . APPENDECTOMY  08/24/2004  . COLON SURGERY  08/11/2013   removed a foot of colon  . COLONOSCOPY  2019  . colonoscopy 10-18-14    . COLONOSCOPY WITH PROPOFOL  07/07/2013  . CYSTOSCOPY  11/01/2003  . ENDOMETRIAL FULGURATION  11/01/2003  . ESOPHAGOGASTRODUODENOSCOPY  07/07/2013  . IR IMAGING GUIDED PORT INSERTION  03/05/2019  . LAPAROSCOPIC LYSIS OF ADHESIONS  11/01/2003  . LAPAROSCOPIC PARTIAL COLECTOMY Right 08/11/2013   Procedure: LAPAROSCOPIC RIGHT HEMICOLECTOMY;  Surgeon: Stark Klein, MD;  Location: [place];  Service: General;  Laterality: Right;  . LAPAROTOMY N/A 12/28/2014   Procedure: EXPLORATORY LAPAROTOMY;  Surgeon: Everitt Amber, MD;  Location[facility];  Service: Gynecology;  Laterality: N/A;  . LEFT OOPHORECTOMY  08/24/2004  . PANNICULECTOMY  08/24/2004  . PORT-A-CATH REMOVAL Left 06/08/2014   Procedure: REMOVAL  PORT-A-CATH;  Surgeon: Stark Klein, MD;  Location: [place];  Service: General;  Laterality: Left;  . PORTACATH PLACEMENT Left 09/09/2013   Procedure: INSERTION PORT-A-CATH;  Surgeon: Stark Klein, MD;  Location: [place];  Service: General;  Laterality: Left;  . SALPINGOOPHORECTOMY Right 12/28/2014   Procedure: RIGHT SALPINGO OOPHORECTOMY;  Surgeon: Everitt Amber, MD;  [facility];   Service: Gynecology;  Laterality: Right;  . TUBAL LIGATION  11/07/2000  . UNILATERAL SALPINGECTOMY Left 08/24/2004  . WISDOM TOOTH EXTRACTION Bilateral 1994    Family History  Problem Relation Age of Onset  . Colon polyps Mother        3+ colon polyps at each colonoscopy - "several"  . Colitis Mother   . Heart attack Mother   . Diabetes Mother   . Heart disease Mother   . Diabetes Father   . Heart attack Father   . Stroke Father   . Congestive Heart Failure Father   . Heart disease Father   . Cirrhosis Maternal Aunt   . Lung cancer Maternal Uncle        d. 21s; smoker and worked around asbestos  . Other Paternal Grandfather        "bone cancer"  . Colon cancer Maternal Uncle        dx early 19s  . Breast cancer Maternal Aunt        dx 50s-60s; s/p lumpectomy  . Dementia Maternal Aunt   . Other Cousin        maternal 1st cousin w/ "lung issues"  . Dementia Paternal Aunt   . Dementia Paternal Uncle   . Prostate cancer Paternal Uncle 42  . Esophageal cancer Neg Hx   . Rectal cancer Neg Hx   . Stomach cancer Neg Hx     Social History   Socioeconomic History  . Marital status: Married    Spouse name: Not on file  . Number of children: 2  . Years of education: Not on file  . Highest education level: Not on file  Occupational History  . Occupation: Unemployed- full time student    Comment: Completed 12th grade  Tobacco Use  . Smoking status: Never Smoker  . Smokeless tobacco: Never Used  Vaping Use  . Vaping Use: Never used  Substance and Sexual Activity  . Alcohol use: No  . Drug use: No  . Sexual activity: Yes    Birth control/protection: Surgical  Other Topics Concern  . Not on file  Social History Narrative   Right handed    Social Determinants of Health   Financial Resource Strain: Not on file  Food Insecurity: Not on file  Transportation Needs: Not on file  Physical Activity: Not on file  Stress: Not on file  Social Connections: Not on file   Intimate Partner Violence: Not on file    Objective:   Today's Vitals: BP 132/70   Pulse 87   Ht 5\' 6"  (1.676 m)   Wt 154 lb 3.2 oz (69.9 kg)   SpO2 98%   BMI 24.89 kg/m   Physical Exam  45 year old female in no acute distress Cardiovascular exam with regular rate and rhythm, no murmurs appreciated Lungs clear to auscultation bilaterally Diabetic foot exam completed, documented using flowsheet  Assessment & Plan:   Problem List Items Addressed This Visit      Endocrine   Diabetes ([place])    New, active Hemoglobin A1c 8.6% on recent labs Discussed diagnosis, prognosis, management, potential for complications  Discussed importance of lifestyle modifications, appropriate dietary changes, role of regular physical activity Has already been referred to nutritionist, advised to continue with this Discussed role of metformin, patient is in agreement with initiation, prescription sent to pharmacy, advised on proper titration of this in order to limit GI side effects Foot exam completed today Has an eye doctor, aware of need for retinopathy screening, she will plan to follow-up with her eye doctor Microalbumin creatinine ratio completed today Check lipid panel in the future      Relevant Medications   metFORMIN (GLUCOPHAGE) 500 MG tablet   Other Relevant Orders   POCT UA - Microalbumin (Completed)     Genitourinary   UTI (urinary tract infection) - Primary    Seen at urgent care, prescribed antibiotics and Pyridium Recommend continuing course of prescribed antibiotic and monitoring for resolution Discussed role of uncontrolled diabetes in contributing to risk of infections, particularly due to UTI Encouraged adequate hydration         Outpatient Encounter Medications as of 06/02/2020  Medication Sig  . acetaminophen (TYLENOL) 500 MG tablet Take 1,000 mg by mouth every 6 (six) hours as needed.  . diphenhydrAMINE (BENADRYL) 50 MG tablet Take 100 mg by mouth at bedtime as  needed for itching or sleep.  . diphenoxylate-atropine (LOMOTIL) 2.5-0.025 MG tablet TAKE 1- 2 TABLETS BY MOUTH 4 TIMES DAILY AS NEEDED FOR DIARRHEA /  LOOSE  STOOLS  . doxycycline (VIBRA-TABS) 100 MG tablet Take 1 tablet (100 mg total) by mouth 2 (two) times daily.  . fluticasone (CUTIVATE) 0.05 % cream Apply topically 2 (two) times daily. To rash  . hydrocortisone 2.5 % cream Apply topically 2 (two) times daily. To rash  . Lacosamide (VIMPAT) 100 MG TABS Take two tablets daily for one week. Samples of this drug were given to the patient, quantity 1 box (14 tablets), Lot Number [id number]  . lacosamide (VIMPAT) 200 MG TABS tablet Samples of this drug were given to the patient, quantity 3 boxes (14 tablets in each box), Lot Number#: [id number] exp:9/25  . lidocaine-prilocaine (EMLA) cream Apply 1 application topically as directed. Apply to port site 1 hour prior to stick and cover with plastic wrap  . loperamide (IMODIUM) 2 MG capsule Take 2 mg by mouth as needed for diarrhea or loose stools.  Marland Kitchen LORazepam (ATIVAN) 0.5 MG tablet Take 1 tablet by mouth twice daily as needed for anxiety  . magnesium oxide (MAG-OX) 400 (241.3 Mg) MG tablet Take 1 tablet (400 mg total) by mouth 2 (two) times daily.  . metFORMIN (GLUCOPHAGE) 500 MG tablet Take 1 tablet by mouth twice daily with food for 7 days.  Then increase to 2 tablets with food in the morning and 1 tablet with food in the evening for 7 days.  Then increase to 2 tablets by mouth twice daily with food.  . nitrofurantoin, macrocrystal-monohydrate, (MACROBID) 100 MG capsule Take 1 capsule (100 mg total) by mouth 2 (two) times daily.  Marland Kitchen omeprazole (PRILOSEC) 40 MG capsule Take 40 mg by mouth as needed.  . phenazopyridine (PYRIDIUM) 200 MG tablet Take 1 tablet (200 mg total) by mouth 3 (three) times daily.  . prochlorperazine (COMPAZINE) 10 MG tablet Take 1 tablet (10 mg total) by mouth every 6 (six) hours as needed for nausea or vomiting.  . lacosamide (VIMPAT)  200 MG TABS tablet Take 1 tablet (200 mg total) by mouth 2 (two) times daily.   No facility-administered encounter medications on file as of 06/02/2020.  Spent 60 minutes on this patient encounter, including preparation, chart review, face-to-face counseling with patient and coordination of care, and documentation of encounter  Follow-up: Return in about 4 weeks (around 06/30/2020).   Donzell Coller J [name], MD

## 2020-06-02 NOTE — Progress Notes (Addendum)
[facility] OFFICE PROGRESS NOTE   Diagnosis:  Colon cancer   INTERVAL HISTORY:   [name] returns for follow-up.  She completed another cycle of irinotecan/Panitumumab 05/26/2020.  She developed pain with urination on 05/28/2020.  She was seen at urgent care on 05/31/2020 due to persistent symptoms.  She was started on Macrobid.  Urine culture returned with insignificant growth.  Symptoms have persisted.  She is having significant pain with urination.  She reports blisters on the labia.  She is having mild nausea which she attributes to the pain.  She had a few mouth sores after chemotherapy.  The mouth sores have resolved.  Mild increase in skin rash.  Objective:  Vital signs in last 24 hours:  Blood pressure 129/80, pulse 78, temperature 98.1 F (36.7 C), temperature source Oral, resp. rate 20, height 5' 6"  (1.676 m), weight 155 lb 3.2 oz (70.4 kg), SpO2 91 %.    HEENT: No thrush or ulcers.  Geographic tongue. Resp: Lungs clear bilaterally. Cardio: Regular rate and rhythm. GI: Abdomen soft and nontender.  No hepatomegaly. Vascular: No leg edema.  Skin: Few acne type skin lesions over the face and trunk.  1 cm area of induration at the mid upper abdomen (improved).  Diffuse ulcerations over the labia extending to the perineum/perirectal region. Port-A-Cath without erythema.  Lab Results:  Lab Results  Component Value Date   WBC 4.3 05/26/2020   HGB 11.3 (L) 05/26/2020   HCT 36.2 05/26/2020   MCV 78.4 (L) 05/26/2020   PLT 166 05/26/2020   NEUTROABS 3.2 05/26/2020    Imaging:  No results found.  Medications: I have reviewed the patient's current medications.  Assessment/Plan: 1. 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  The tumor returned microsatellite stable with no loss of mismatch repair protein expression   APC mutated. No BRAF, KRAS, or NRAS  mutation On Foundation 1 testing   Cycle 1 adjuvant FOLFOX 09/08/2013   Cycle 2 adjuvant FOLFOX 09/24/2013   Cycle 3 adjuvant FOLFOX 10/08/2013.   Cycle 4 adjuvant FOLFOX 10/22/2013.   Cycle 5 adjuvant FOLFOX 11/05/2013. Oxaliplatin held due to thrombocytopenia.  Cycle 6 FOLFOX 11/19/2013.  Cycle 7 FOLFOX 12/03/2013. Oxaliplatin held secondary to thrombocytopenia.  Cycle 8 FOLFOX 12/17/2013.  Cycle 9 FOLFOX 01/04/2014. Oxaliplatin held secondary to neutropenia.   Cycle 10 FOLFOX 01/21/2014. Oxaliplatin held secondary to thrombocytopenia.  Cycle 11 FOLFOX 02/04/2014  Cycle 12 FOLFOX 02/18/2014, oxaliplatin dose reduced secondary tothrombocytopenia  CT abdomen/pelvis 01/30/2014 revealed splenomegaly and no evidence of recurrent colon cancer  CT chest 04/07/2014 with a stable right lower lobe nodule and no evidence for metastatic disease, no nodules seen on the CT 11/26/2014  Markedly elevated CEA 11/24/2014  CT 11/26/2014 revealed a right pelvic mass, splenomegaly, small volume ascites  Right salpingo-oophorectomy 12/28/2014 with the pathology confirming metastatic colon cancer  CTs 03/23/2016-no evidence of recurrent or metastatic disease.  CT 11/26/2016-enlargement of a fluid density structure the right pelvic sidewall, no other evidence of metastatic disease  CT aspiration right pelvic cyst 12/19/2016.Cytology-BENIGN REACTIVE/REPARATIVE CHANGES.  CTs 06/05/2017-no evidence of metastatic disease, mild cirrhotic changes with splenomegaly  CTs 12/11/2017-recurrent cystic right adnexal mass, similar to on the CT 11/26/2016, stable mild splenomegaly  CTs 04/23/2018-enlargement of cystic right adnexal mass with mural nodularity, no other evidence of metastatic disease  Cytoreductive surgery/HIPEC with mitomycin by Dr. Clovis Riley at [facility] 08/12/2018-R1 resection achieved. Cytoreduction included omentectomy, LAR, right salpingo-oophorectomy and  left colonic gutter/pelvic  stripping. Pathology on the rectum showed recurrent/metastatic adenocarcinoma, tumor 2.0 cm, predominantly involving the subserosa and muscularis propria of the colon, proximal and distal margins of resection were negative, vascular invasion present, metastatic carcinoma present in 1 out of 5 lymph nodes; omentum resection with no malignancy seen, no metastatic carcinoma identified in 1 lymph node examined; left gutter stripping positive metastatic adenocarcinoma; right ovary resection positive metastatic adenocarcinoma.  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  CEA 68 02/03/2019  Biopsy liver lesion 02/11/2019-metastatic adenocarcinoma consistent with primary colonic adenocarcinoma  CTs 02/27/2019-multiple liver lesions increased in size, new lesion in the lateral right lobe of the liver. No evidence of metastatic disease in the chest. Redemonstrated moderate left hydronephrosis and proximal hydroureter without discrete lesion or obstructing etiology at the transition point of the mid ureter.  Cycle 1 FOLFIRI/Panitumumab 03/12/2019  Cycle 2 FOLFIRI/Panitumumab 03/26/2019-bolus 5-FU and irinotecan held secondary to neutropenia  Cycle 3 FOLFIRI/Panitumumab 04/09/2019, Udenyca added-not given secondary to seizure/discontinuation of the 5-FU pump  CT abdomen/pelvis [facility] 05/04/2019-no residual fluid collection at the left abdominal wall abscess, stable moderate left hydronephrosis, multifocal indeterminate liver lesions--liver lesionssignificantly improved  Cycle 4 FOLFIRI/Panitumumab 05/07/2019, Udenyca  Cycle 5 FOLFIRI/Panitumumab 05/20/2019, Udenyca  Cycle 6 FOLFIRI/Panitumumab 06/18/2019, Udenyca  Cycle 7 FOLFIRI/Panitumumab 07/01/2019 (Irinotecan dose reduced due to thrombocytopenia), Udenyca--Udenyca was not given  Cycle 8 FOLFIRI/Panitumumab 07/15/2019, Udenyca  Cycle 9 FOLFIRI/Panitumumab 07/29/2019,  Udenyca  Cycle 10 FOLFIRI/Panitumumab 08/13/2019, Udenyca  CTs at [facility] 08/19/2019-decreased size of the hypodense and hyperdense lesions within segment 2 of the left hepatic lobe. No new lesions identified. Similar presacral soft tissue thickening with adjacent stable alignment in the rectum. Improved but persistent left UPJ obstruction with mild left hydronephrosis. Persistent but decreased size of the wound within the left mid abdomen abdominal wall.  Cycle 11 irinotecan/Panitumumab 08/27/2019, Udenyca  Cycle 12 irinotecan/Panitumumab 10/08/2019, Udenyca  Cycle 13 irinotecan/Panitumumab 11/05/2019, Udenyca  CT abdomen/pelvis 11/19/2019-no new or progressive interval findings. Stable appearance of the calcified lesion in the anterior left hepatic dome with second tiny low-density lesion in the dome of the lateral segment left liver. Both lesions are markedly decreased since 02/27/2019. Stable mild fullness left intrarenal collecting system and renal pelvis. Similar appearance of abnormal soft tissue in the left paramidline subcutaneous fat with associated skin thickening, this likely correlates to the fistula.  Cycle 14 irinotecan/Panitumumab 11/19/2019, Udenyca  Cycle 15 irinotecan/Panitumumab 12/02/2019, Udenyca  CT abdomen/pelvis at [facility] 02/11/2020-similar to slight decrease in segment 2 hypodense and hyperdense lesions (compared to a CT from July 2020), no new lesions, splenomegaly, no fluid collection or abscess, mild stranding adjacent to the incision with overlying skin thickening  Cycle 16 irinotecan/Panitumumab 03/03/2020, Udenyca  Cycle 17 irinotecan/Panitumumab 03/17/2020, Udenyca  Cycle 18 irinotecan/panitumumab 04/07/2020, Udenyca  Cycle 19 irinotecan/Panitumumab 04/28/2020, Udenyca  Cycle 20 irinotecan/panitumumab 05/26/2020, Udenyca 2. Mild elevation of the CEA beginning January 2016, normal on 05/19/2014   3. History of iron deficiency anemia  4. seizure  disorder; seizure 04/09/2019. Brain CT negative. Now on Keppra. 5. history of depression  6. 4 mm right lower lobe nodule on a staging chest CT 09/08/2013 , stable on a CT 04/07/2014 7. Hospitalization 09/24/2013 through 09/26/2013 with fever and abdominal pain.  8. 09/24/2013 urine culture positive for coag negative staph.  9. 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 Splenomegaly noted on a CT scan 01/30/2014,persistent on repeat CTs  12. Colonoscopy 11/17/2018-flexible sigmoidoscopy per rectum with changes of mild diversion colitis. Scope advanced for approximately 25 cm. Most proximal portion had necrotic appearing debris. Colostomy bag insufflated suggesting some type of communication between the pouch and the proximal colon. Introduction of scope into the ostomy found to available directions. 1 toward the distal pouch with similar appearing necrotic debris encountered. The other was about a 30 cm segment of normal-appearing colonic mucosa to the level of the previous right hemicolectomy ileocolonic anastomosis. 45. Port-A-Cath placement interventional radiology 03/05/2019 14. Left leg/foot weakness-brain MRI 09/03/2019 with no evidence of metastatic disease, referred to physical therapy 15. 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 No malignancy.  16. COVID-19 + 09/10/2019 17. Hospitalized  with seizure 09/17/2019 through 09/19/2019-MRI brain without evidence of metastatic disease. Seen by neurology. Keppra dose increased. 19. Rash secondary to Panitumumab    Disposition: Ms. Tvedt completed a cycle of irinotecan/Panitumumab 05/26/2020.  She has subsequently developed diffuse ulcerations over the labia/perineum with significant associated pain.  We discussed possible etiologies including irinotecan or Panitumumab induced mucositis, viral infection, other.  She will apply a barrier cream.  We prescribed Valtrex 1 g twice daily for 10 days and oxycodone 5 mg every 6 hours as needed.  She understands she should not drive while taking oxycodone.  If symptoms have not improved by 06/06/2020 she will contact the office and we will make a referral to gynecology.  She agrees with this plan.  Patient seen with Dr. Benay Spice.    Ned Card ANP/GNP-BC   06/02/2020  2:53 PM This was a shared visit with Ned Card.  Ms. Kemple was interviewed and examined.  She has been diagnosed with diabetes and started metformin.  She presents today with severe ulcerations at the labia and perineum.  This is most likely secondary to panitumumab, but a viral infection is possible.  She will call if the pain has not improved by 06/06/2020.  The inflammatory lesion at the mid upper abdomen appears improved.  I was present for greater than 50% of today's visit.  I performed medical decision making.  Julieanne Manson, MD

## 2020-06-02 NOTE — Assessment & Plan Note (Signed)
New, active Hemoglobin A1c 8.6% on recent labs Discussed diagnosis, prognosis, management, potential for complications Discussed importance of lifestyle modifications, appropriate dietary changes, role of regular physical activity Has already been referred to nutritionist, advised to continue with this Discussed role of metformin, patient is in agreement with initiation, prescription sent to pharmacy, advised on proper titration of this in order to limit GI side effects Foot exam completed today Has an eye doctor, aware of need for retinopathy screening, she will plan to follow-up with her eye doctor Microalbumin creatinine ratio completed today Check lipid panel in the future

## 2020-06-02 NOTE — Patient Instructions (Signed)
  Medication Instructions:  Your physician has recommended you make the following change in your medication:  -- START Metformin 500 mg - Take 1 tablet by mouth twice daily for 7 days. Then increase to 2 tablets (1000 mg) in the morning and 1 tablet (500 mg) in the evening for 7 days. Then increase to 2 tablets (1000 mg) by mouth twice daily. --If you need a refill on any your medications before your next appointment, please call your pharmacy first. If no refills are authorized on file call the office.--  Lab Work: Your physician has recommended that you have lab work today: Urine Microalbumin If you have labs (blood work) drawn today and your tests are completely normal, you will receive your results only by: Marland Kitchen MyChart Message (if you have MyChart) OR . A phone call from our staff. Please ensure you check your voicemail in the event that you authorized detailed messages to be left on a delegated number. If you have any lab test that is abnormal or we need to change your treatment, we will call you to review the results.  Follow-Up: Your next appointment:   Your physician recommends that you schedule a follow-up appointment in: 36 WEEKS with Dr. [name]  Thanks for letting us be apart of your health journey!!  Primary Care and Sports Medicine   Dr. [name] and Worthy Keeler, DNP, AGNP  We recommend signing up for the patient portal called "MyChart".  Sign up information is provided on this After Visit Summary.  MyChart is used to connect with patients for Virtual Visits (Telemedicine).  Patients are able to view lab/test results, encounter notes, upcoming appointments, etc.  Non-urgent messages can be sent to your provider as well.   To learn more about what you can do with MyChart, please visit --  [email or web address].

## 2020-06-02 NOTE — Assessment & Plan Note (Signed)
Seen at urgent care, prescribed antibiotics and Pyridium Recommend continuing course of prescribed antibiotic and monitoring for resolution Discussed role of uncontrolled diabetes in contributing to risk of infections, particularly due to UTI Encouraged adequate hydration

## 2020-06-03 ENCOUNTER — Encounter: Payer: Self-pay | Admitting: Nurse Practitioner

## 2020-06-06 ENCOUNTER — Encounter: Payer: Self-pay | Admitting: Nurse Practitioner

## 2020-06-06 ENCOUNTER — Inpatient Hospital Stay: Payer: BC Managed Care – PPO | Attending: Oncology | Admitting: Nutrition

## 2020-06-06 ENCOUNTER — Other Ambulatory Visit: Payer: Self-pay

## 2020-06-06 DIAGNOSIS — Z5189 Encounter for other specified aftercare: Secondary | ICD-10-CM | POA: Insufficient documentation

## 2020-06-06 DIAGNOSIS — C787 Secondary malignant neoplasm of liver and intrahepatic bile duct: Secondary | ICD-10-CM | POA: Insufficient documentation

## 2020-06-06 DIAGNOSIS — Z5111 Encounter for antineoplastic chemotherapy: Secondary | ICD-10-CM | POA: Insufficient documentation

## 2020-06-06 DIAGNOSIS — D6959 Other secondary thrombocytopenia: Secondary | ICD-10-CM | POA: Insufficient documentation

## 2020-06-06 DIAGNOSIS — C182 Malignant neoplasm of ascending colon: Secondary | ICD-10-CM | POA: Insufficient documentation

## 2020-06-06 DIAGNOSIS — Z5112 Encounter for antineoplastic immunotherapy: Secondary | ICD-10-CM | POA: Insufficient documentation

## 2020-06-06 DIAGNOSIS — C7961 Secondary malignant neoplasm of right ovary: Secondary | ICD-10-CM | POA: Insufficient documentation

## 2020-06-06 DIAGNOSIS — G620 Drug-induced polyneuropathy: Secondary | ICD-10-CM | POA: Insufficient documentation

## 2020-06-06 DIAGNOSIS — D509 Iron deficiency anemia, unspecified: Secondary | ICD-10-CM | POA: Insufficient documentation

## 2020-06-06 NOTE — Progress Notes (Signed)
Nutrition follow-up completed with patient diagnosed with colon cancer receiving chemotherapy.  She has a new diagnosis of diabetes.  She was started on metformin.  She is seeing a primary physician to help manage her diabetes.  Referral received to educate on no concentrated sweets diet.  Current weight documented as 154.2 pounds April 28, stable overall. Noted labs: HG A1c 8.6 and blood sugar 283. Patient reports she drinks a lot of sweet tea and juices.  She does enjoy sugar on cereals.  She eats the occasional brownie. Reports diarrhea and increased fatigue. She is concerned because she was not asked to check her blood sugars when she started metformin.  Nutrition diagnosis: Food and nutrition related knowledge deficit related to new diagnosis of diabetes as evidenced by HG A1c of 8.6.  Intervention: Patient educated on following a no concentrated sweets diet during chemotherapy and provided with nutrition fact sheet.  Focused on decreasing simple sugars and paring carbohydrate choices with protein and fat in order to improve glycemic control. Brief education provided on foods to choose and foods to avoid secondary to diarrhea.  Nutrition fact sheet provided. Encouraged continued weight stability. Questions were answered.  Teach back method used.  Contact information given.  Monitoring, evaluation, goals: Patient will tolerate no concentrated sweets diet for better glycemic control.  Patient should be referred to nutrition and diabetes education services for further, more detailed diabetic education.  **Disclaimer: This note was dictated with voice recognition software. Similar sounding words can inadvertently be transcribed and this note may contain transcription errors which may not have been corrected upon publication of note.**

## 2020-06-08 ENCOUNTER — Telehealth (HOSPITAL_BASED_OUTPATIENT_CLINIC_OR_DEPARTMENT_OTHER): Payer: Self-pay

## 2020-06-08 NOTE — Telephone Encounter (Signed)
-----   Message from Raymond J [name], MD sent at 06/07/2020  4:42 PM EDT ----- Normal microalbumin/creatinine ratio.  This test checks for abnormal amounts of protein in the urine which is a sign of diabetes affecting the kidneys.  Findings from this lab are reassuring at this time.

## 2020-06-08 NOTE — Telephone Encounter (Signed)
Results released by Dr. de [place] and reviewed by patient via MyChart Instructed patient to contact the office with any questions or concerns.  

## 2020-06-10 ENCOUNTER — Other Ambulatory Visit: Payer: Self-pay | Admitting: Oncology

## 2020-06-10 DIAGNOSIS — C182 Malignant neoplasm of ascending colon: Secondary | ICD-10-CM

## 2020-06-10 DIAGNOSIS — C787 Secondary malignant neoplasm of liver and intrahepatic bile duct: Secondary | ICD-10-CM

## 2020-06-11 ENCOUNTER — Other Ambulatory Visit: Payer: Self-pay | Admitting: Oncology

## 2020-06-15 ENCOUNTER — Other Ambulatory Visit: Payer: BC Managed Care – PPO

## 2020-06-15 ENCOUNTER — Other Ambulatory Visit: Payer: Self-pay

## 2020-06-16 ENCOUNTER — Telehealth: Payer: Self-pay | Admitting: Oncology

## 2020-06-16 ENCOUNTER — Encounter: Payer: Self-pay | Admitting: Oncology

## 2020-06-16 NOTE — Telephone Encounter (Signed)
R/s appt per 5/12 sch msg. Pt aware.

## 2020-06-17 ENCOUNTER — Other Ambulatory Visit: Payer: BC Managed Care – PPO

## 2020-06-17 ENCOUNTER — Other Ambulatory Visit: Payer: Self-pay

## 2020-06-17 ENCOUNTER — Ambulatory Visit: Payer: Self-pay

## 2020-06-17 ENCOUNTER — Ambulatory Visit: Payer: Self-pay | Admitting: Oncology

## 2020-06-18 ENCOUNTER — Inpatient Hospital Stay: Payer: BC Managed Care – PPO

## 2020-06-29 ENCOUNTER — Ambulatory Visit (HOSPITAL_BASED_OUTPATIENT_CLINIC_OR_DEPARTMENT_OTHER): Payer: BC Managed Care – PPO | Admitting: Family Medicine

## 2020-06-30 ENCOUNTER — Ambulatory Visit (HOSPITAL_BASED_OUTPATIENT_CLINIC_OR_DEPARTMENT_OTHER): Payer: BC Managed Care – PPO | Admitting: Family Medicine

## 2020-06-30 ENCOUNTER — Inpatient Hospital Stay: Payer: BC Managed Care – PPO

## 2020-06-30 ENCOUNTER — Other Ambulatory Visit: Payer: Self-pay

## 2020-06-30 ENCOUNTER — Inpatient Hospital Stay (HOSPITAL_BASED_OUTPATIENT_CLINIC_OR_DEPARTMENT_OTHER): Payer: BC Managed Care – PPO | Admitting: Oncology

## 2020-06-30 VITALS — BP 139/90 | HR 76 | Temp 98.3°F | Resp 20 | Ht 66.0 in | Wt 159.4 lb

## 2020-06-30 DIAGNOSIS — Z5112 Encounter for antineoplastic immunotherapy: Secondary | ICD-10-CM | POA: Diagnosis not present

## 2020-06-30 DIAGNOSIS — G620 Drug-induced polyneuropathy: Secondary | ICD-10-CM | POA: Diagnosis not present

## 2020-06-30 DIAGNOSIS — D509 Iron deficiency anemia, unspecified: Secondary | ICD-10-CM | POA: Diagnosis not present

## 2020-06-30 DIAGNOSIS — C182 Malignant neoplasm of ascending colon: Secondary | ICD-10-CM | POA: Diagnosis not present

## 2020-06-30 DIAGNOSIS — Z5189 Encounter for other specified aftercare: Secondary | ICD-10-CM | POA: Diagnosis not present

## 2020-06-30 DIAGNOSIS — C7961 Secondary malignant neoplasm of right ovary: Secondary | ICD-10-CM | POA: Diagnosis not present

## 2020-06-30 DIAGNOSIS — Z5111 Encounter for antineoplastic chemotherapy: Secondary | ICD-10-CM | POA: Diagnosis not present

## 2020-06-30 DIAGNOSIS — C787 Secondary malignant neoplasm of liver and intrahepatic bile duct: Secondary | ICD-10-CM | POA: Diagnosis not present

## 2020-06-30 DIAGNOSIS — D6959 Other secondary thrombocytopenia: Secondary | ICD-10-CM | POA: Diagnosis not present

## 2020-06-30 LAB — CMP (CANCER CENTER ONLY)
ALT: 38 U/L (ref 0–44)
AST: 30 U/L (ref 15–41)
Albumin: 4.1 g/dL (ref 3.5–5.0)
Alkaline Phosphatase: 135 U/L — ABNORMAL HIGH (ref 38–126)
Anion gap: 9 (ref 5–15)
BUN: 15 mg/dL (ref 6–20)
CO2: 25 mmol/L (ref 22–32)
Calcium: 9.3 mg/dL (ref 8.9–10.3)
Chloride: 105 mmol/L (ref 98–111)
Creatinine: 0.59 mg/dL (ref 0.44–1.00)
GFR, Estimated: >60 mL/min (ref >60)
Glucose, Bld: 172 mg/dL — ABNORMAL HIGH (ref 70–99)
Potassium: 4.3 mmol/L (ref 3.5–5.1)
Sodium: 139 mmol/L (ref 135–145)
Total Bilirubin: 0.5 mg/dL (ref 0.3–1.2)
Total Protein: 6.9 g/dL (ref 6.5–8.1)

## 2020-06-30 LAB — CBC WITH DIFFERENTIAL (CANCER CENTER ONLY)
Abs Immature Granulocytes: 0.01 10*3/uL (ref 0.00–0.07)
Basophils Absolute: 0 10*3/uL (ref 0.0–0.1)
Basophils Relative: 1 %
Eosinophils Absolute: 0.2 10*3/uL (ref 0.0–0.5)
Eosinophils Relative: 4 %
HCT: 38.3 % (ref 36.0–46.0)
Hemoglobin: 11.7 g/dL — ABNORMAL LOW (ref 12.0–15.0)
Immature Granulocytes: 0 %
Lymphocytes Relative: 22 %
Lymphs Abs: 0.9 10*3/uL (ref 0.7–4.0)
MCH: 24.1 pg — ABNORMAL LOW (ref 26.0–34.0)
MCHC: 30.5 g/dL (ref 30.0–36.0)
MCV: 79 fL — ABNORMAL LOW (ref 80.0–100.0)
Monocytes Absolute: 0.3 10*3/uL (ref 0.1–1.0)
Monocytes Relative: 7 %
Neutro Abs: 2.7 10*3/uL (ref 1.7–7.7)
Neutrophils Relative %: 66 %
Platelet Count: 198 10*3/uL (ref 150–400)
RBC: 4.85 MIL/uL (ref 3.87–5.11)
RDW: 15.6 % — ABNORMAL HIGH (ref 11.5–15.5)
WBC Count: 4 10*3/uL (ref 4.0–10.5)
nRBC: 0 % (ref 0.0–0.2)

## 2020-06-30 LAB — CEA (ACCESS): CEA (CHCC): 27.57 ng/mL — ABNORMAL HIGH (ref 0.00–5.00)

## 2020-06-30 LAB — MAGNESIUM: Magnesium: 1.8 mg/dL (ref 1.7–2.4)

## 2020-06-30 LAB — CEA (IN HOUSE-CHCC): CEA (CHCC-In House): 43.43 ng/mL — ABNORMAL HIGH (ref 0.00–5.00)

## 2020-06-30 MED ORDER — SODIUM CHLORIDE 0.9 % IV SOLN
6.0000 mg/kg | Freq: Once | INTRAVENOUS | Status: AC
  Administered 2020-06-30: 400 mg via INTRAVENOUS
  Filled 2020-06-30: qty 20

## 2020-06-30 MED ORDER — DEXAMETHASONE SODIUM PHOSPHATE 10 MG/ML IJ SOLN
5.0000 mg | Freq: Once | INTRAMUSCULAR | Status: AC
Start: 2020-06-30 — End: 2020-06-30
  Administered 2020-06-30: 5 mg via INTRAVENOUS
  Filled 2020-06-30: qty 1

## 2020-06-30 MED ORDER — SODIUM CHLORIDE 0.9 % IV SOLN
140.0000 mg/m2 | Freq: Once | INTRAVENOUS | Status: AC
  Administered 2020-06-30: 260 mg via INTRAVENOUS
  Filled 2020-06-30: qty 13

## 2020-06-30 MED ORDER — SODIUM CHLORIDE 0.9 % IV SOLN: 5.0000 mg | Freq: Once | INTRAVENOUS | Status: DC

## 2020-06-30 MED ORDER — PALONOSETRON HCL INJECTION 0.25 MG/5ML
0.2500 mg | Freq: Once | INTRAVENOUS | Status: AC
  Administered 2020-06-30: 0.25 mg via INTRAVENOUS
  Filled 2020-06-30: qty 5

## 2020-06-30 MED ORDER — SODIUM CHLORIDE 0.9% FLUSH
10.0000 mL | INTRAVENOUS | Status: DC | PRN
  Administered 2020-06-30: 10 mL
  Filled 2020-06-30: qty 10

## 2020-06-30 MED ORDER — ATROPINE SULFATE 1 MG/ML IJ SOLN
0.5000 mg | Freq: Once | INTRAMUSCULAR | Status: AC | PRN
  Administered 2020-06-30: 0.5 mg via INTRAVENOUS
  Filled 2020-06-30: qty 1

## 2020-06-30 MED ORDER — LORAZEPAM 0.5 MG PO TABS: 0.5000 mg | ORAL_TABLET | Freq: Two times a day (BID) | ORAL | 0 refills | Status: DC | PRN

## 2020-06-30 MED ORDER — HEPARIN SOD (PORK) LOCK FLUSH 100 UNIT/ML IV SOLN
500.0000 [IU] | Freq: Once | INTRAVENOUS | Status: AC | PRN
  Administered 2020-06-30: 500 [IU]
  Filled 2020-06-30: qty 5

## 2020-06-30 MED ORDER — SODIUM CHLORIDE 0.9 % IV SOLN
Freq: Once | INTRAVENOUS | Status: AC
  Filled 2020-06-30: qty 250

## 2020-06-30 NOTE — Patient Instructions (Signed)

## 2020-06-30 NOTE — Progress Notes (Signed)
[facility] OFFICE PROGRESS NOTE   Diagnosis: Colon cancer  INTERVAL HISTORY:   [name] returns as scheduled.  The labia ulcerations have completely healed.  No skin rash.  She is taking doxycycline.  She ran out of lorazepam and used oxycodone for insomnia and restless legs.  Good appetite.  She is taking metformin, but she is not monitoring the blood sugar.  She is working.  Objective:  Vital signs in last 24 hours:  Blood pressure 139/90, pulse 76, temperature 98.3 F (36.8 C), temperature source Oral, resp. rate 20, height 5' 6"  (1.676 m), weight 159 lb 6.4 oz (72.3 kg), SpO2 100 %.    HEENT: No thrush or ulcers Resp: Lungs clear bilaterally Cardio: Regular rate and rhythm GI: No hepatosplenomegaly, no mass Vascular: No leg edema  Skin: Very mild acne type rash over the chin, no paronychia  Portacath/PICC-without erythema  Lab Results:  Lab Results  Component Value Date   WBC 4.0 06/30/2020   HGB 11.7 (L) 06/30/2020   HCT 38.3 06/30/2020   MCV 79.0 (L) 06/30/2020   PLT 198 06/30/2020   NEUTROABS 2.7 06/30/2020    CMP  Lab Results  Component Value Date   NA 135 05/27/2020   K 4.3 05/27/2020   CL 102 05/27/2020   CO2 24 05/27/2020   GLUCOSE 283 (H) 05/27/2020   BUN 21 (H) 05/27/2020   CREATININE 0.67 05/27/2020   CALCIUM 9.7 05/27/2020   PROT 7.3 05/27/2020   ALBUMIN 4.3 05/27/2020   AST 24 05/27/2020   ALT 35 05/27/2020   ALKPHOS 142 (H) 05/27/2020   BILITOT 1.0 05/27/2020   GFRNONAA >60 05/27/2020   GFRAA >60 11/05/2019    Lab Results  Component Value Date   CEA1 47.36 (H) 05/26/2020     Medications: I have reviewed the patient's current medications.   Assessment/Plan: 1. 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  The tumor returned microsatellite stable with no loss of mismatch repair protein expression   APC mutated. No  BRAF, KRAS, or NRAS mutation On [facility] 1 testing   Cycle 1 adjuvant FOLFOX 09/08/2013   Cycle 2 adjuvant FOLFOX 09/24/2013   Cycle 3 adjuvant FOLFOX 10/08/2013.   Cycle 4 adjuvant FOLFOX 10/22/2013.   Cycle 5 adjuvant FOLFOX 11/05/2013. Oxaliplatin held due to thrombocytopenia.  Cycle 6 FOLFOX 11/19/2013.  Cycle 7 FOLFOX 12/03/2013. Oxaliplatin held secondary to thrombocytopenia.  Cycle 8 FOLFOX 12/17/2013.  Cycle 9 FOLFOX 01/04/2014. Oxaliplatin held secondary to neutropenia.   Cycle 10 FOLFOX 01/21/2014. Oxaliplatin held secondary to thrombocytopenia.  Cycle 11 FOLFOX 02/04/2014  Cycle 12 FOLFOX 02/18/2014, oxaliplatin dose reduced secondary tothrombocytopenia  CT abdomen/pelvis 01/30/2014 revealed splenomegaly and no evidence of recurrent colon cancer  CT chest 04/07/2014 with a stable right lower lobe nodule and no evidence for metastatic disease, no nodules seen on the CT 11/26/2014  Markedly elevated CEA 11/24/2014  CT 11/26/2014 revealed a right pelvic mass, splenomegaly, small volume ascites  Right salpingo-oophorectomy 12/28/2014 with the pathology confirming metastatic colon cancer  CTs 03/23/2016-no evidence of recurrent or metastatic disease.  CT 11/26/2016-enlargement of a fluid density structure the right pelvic sidewall, no other evidence of metastatic disease  CT aspiration right pelvic cyst 12/19/2016.Cytology-BENIGN REACTIVE/REPARATIVE CHANGES.  CTs 06/05/2017-no evidence of metastatic disease, mild cirrhotic changes with splenomegaly  CTs 12/11/2017-recurrent cystic right adnexal mass, similar to on the CT 11/26/2016, stable mild splenomegaly  CTs 04/23/2018-enlargement of cystic right adnexal  mass with mural nodularity, no other evidence of metastatic disease  Cytoreductive surgery/HIPEC with mitomycin by Dr. Clovis Riley at [facility] 08/12/2018-R1 resection achieved. Cytoreduction included omentectomy, LAR, right salpingo-oophorectomy and left  colonic gutter/pelvic stripping. Pathology on the rectum showed recurrent/metastatic adenocarcinoma, tumor 2.0 cm, predominantly involving the subserosa and muscularis propria of the colon, proximal and distal margins of resection were negative, vascular invasion present, metastatic carcinoma present in 1 out of 5 lymph nodes; omentum resection with no malignancy seen, no metastatic carcinoma identified in 1 lymph node examined; left gutter stripping positive metastatic adenocarcinoma; right ovary resection positive metastatic adenocarcinoma.  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  CEA 68 02/03/2019  Biopsy liver lesion 02/11/2019-metastatic adenocarcinoma consistent with primary colonic adenocarcinoma  CTs 02/27/2019-multiple liver lesions increased in size, new lesion in the lateral right lobe of the liver. No evidence of metastatic disease in the chest. Redemonstrated moderate left hydronephrosis and proximal hydroureter without discrete lesion or obstructing etiology at the transition point of the mid ureter.  Cycle 1 FOLFIRI/Panitumumab 03/12/2019  Cycle 2 FOLFIRI/Panitumumab 03/26/2019-bolus 5-FU and irinotecan held secondary to neutropenia  Cycle 3 FOLFIRI/Panitumumab 04/09/2019, Udenyca added-not given secondary to seizure/discontinuation of the 5-FU pump  CT abdomen/pelvis at [facility] 05/04/2019-no residual fluid collection at the left abdominal wall abscess, stable moderate left hydronephrosis, multifocal indeterminate liver lesions--liver lesionssignificantly improved  Cycle 4 FOLFIRI/Panitumumab 05/07/2019, Udenyca  Cycle 5 FOLFIRI/Panitumumab 05/20/2019, Udenyca  Cycle 6 FOLFIRI/Panitumumab 06/18/2019, Udenyca  Cycle 7 FOLFIRI/Panitumumab 07/01/2019 (Irinotecan dose reduced due to thrombocytopenia), Udenyca--Udenyca was not given  Cycle 8 FOLFIRI/Panitumumab 07/15/2019, Udenyca  Cycle 9 FOLFIRI/Panitumumab  07/29/2019, Udenyca  Cycle 10 FOLFIRI/Panitumumab 08/13/2019, Udenyca  CTs at [facility] 08/19/2019-decreased size of the hypodense and hyperdense lesions within segment 2 of the left hepatic lobe. No new lesions identified. Similar presacral soft tissue thickening with adjacent stable alignment in the rectum. Improved but persistent left UPJ obstruction with mild left hydronephrosis. Persistent but decreased size of the wound within the left mid abdomen abdominal wall.  Cycle 11 irinotecan/Panitumumab 08/27/2019, Udenyca  Cycle 12 irinotecan/Panitumumab 10/08/2019, Udenyca  Cycle 13 irinotecan/Panitumumab 11/05/2019, Udenyca  CT abdomen/pelvis 11/19/2019-no new or progressive interval findings. Stable appearance of the calcified lesion in the anterior left hepatic dome with second tiny low-density lesion in the dome of the lateral segment left liver. Both lesions are markedly decreased since 02/27/2019. Stable mild fullness left intrarenal collecting system and renal pelvis. Similar appearance of abnormal soft tissue in the left paramidline subcutaneous fat with associated skin thickening, this likely correlates to the fistula.  Cycle 14 irinotecan/Panitumumab 11/19/2019, Udenyca  Cycle 15 irinotecan/Panitumumab 12/02/2019, Udenyca  CT abdomen/pelvis at [facility] 02/11/2020-similar to slight decrease in segment 2 hypodense and hyperdense lesions (compared to a CT from July 2020), no new lesions, splenomegaly, no fluid collection or abscess, mild stranding adjacent to the incision with overlying skin thickening  Cycle 16 irinotecan/Panitumumab 03/03/2020, Udenyca  Cycle 17 irinotecan/Panitumumab 03/17/2020, Udenyca  Cycle 18 irinotecan/panitumumab 04/07/2020, Udenyca  Cycle 19 irinotecan/Panitumumab 04/28/2020, Udenyca  Cycle 20 irinotecan/panitumumab 05/26/2020, Udenyca  Cycle 21 irinotecan/panitumumab 06/30/2020, Udenyca 2. Mild elevation of the CEA beginning January 2016, normal on  05/19/2014   3. History of iron deficiency anemia  4. seizure disorder; seizure 04/09/2019. Brain CT negative. Now on Keppra. 5. history of depression  6. 4 mm right lower lobe nodule on a staging chest CT 09/08/2013 , stable on a CT 04/07/2014 7. Hospitalization 09/24/2013 through 09/26/2013 with fever and abdominal pain.  8. 09/24/2013 urine culture positive for coag negative staph.  9. 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 Splenomegaly noted on a CT scan 01/30/2014,persistent on repeat CTs 12. Colonoscopy 11/17/2018-flexible sigmoidoscopy per rectum with changes of mild diversion colitis. Scope advanced for approximately 25 cm. Most proximal portion had necrotic appearing debris. Colostomy bag insufflated suggesting some type of communication between the pouch and the proximal colon. Introduction of scope into the ostomy found to available directions. 1 toward the distal pouch with similar appearing necrotic debris encountered. The other was about a 30 cm segment of normal-appearing colonic mucosa to the level of the previous right hemicolectomy ileocolonic anastomosis. 88. Port-A-Cath placement interventional radiology 03/05/2019 14. Left leg/foot weakness-brain MRI 09/03/2019 with no evidence of metastatic disease, referred to physical therapy 15. 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  No malignancy.  16. COVID-19 + 09/10/2019 17. Hospitalized with seizure 09/17/2019 through 09/19/2019-MRI brain without evidence of metastatic disease. Seen by neurology. Keppra dose increased. 19. Rash secondary to Panitumumab     Disposition: [name] appears well today.  The CEA was higher last month.  We will follow up on the CEA from today.  She will complete another treatment with irinotecan and panitumumab today.  She understands it is possible the labia ulcerations will return following this cycle of panitumumab.  She will call for recurrent skin symptoms.  She will undergo a restaging CT evaluation prior to an office visit in 3 weeks.  I refilled her prescription for lorazepam.  Betsy Coder, MD  06/30/2020  10:17 AM

## 2020-06-30 NOTE — Patient Instructions (Signed)
[facility] Discharge Instructions for Patients Receiving Chemotherapy  Today you received the following immunotherapy agent: Panitumumab (Vectibix) and chemotherapy agent: Irinotecan  To help prevent nausea and vomiting after your treatment, we encourage you to take your nausea medication as directed by your MD.   If you develop nausea and vomiting that is not controlled by your nausea medication, call the clinic.   BELOW ARE SYMPTOMS THAT SHOULD BE REPORTED IMMEDIATELY:  *FEVER GREATER THAN 100.5 F  *CHILLS WITH OR WITHOUT FEVER  NAUSEA AND VOMITING THAT IS NOT CONTROLLED WITH YOUR NAUSEA MEDICATION  *UNUSUAL SHORTNESS OF BREATH  *UNUSUAL BRUISING OR BLEEDING  TENDERNESS IN MOUTH AND THROAT WITH OR WITHOUT PRESENCE OF ULCERS  *URINARY PROBLEMS  *BOWEL PROBLEMS  UNUSUAL RASH Items with * indicate a potential emergency and should be followed up as soon as possible.  Feel free to call the clinic should you have any questions or concerns. The clinic phone number is [phone].  Please show the [facility] at check-in to the Emergency Department and triage nurse.  Panitumumab Solution for Injection What is this medicine? PANITUMUMAB (pan i TOOM ue mab) is a monoclonal antibody. It is used to treat colorectal cancer. This medicine may be used for other purposes; ask your health care provider or pharmacist if you have questions. COMMON BRAND NAME(S): Vectibix What should I tell my health care provider before I take this medicine? They need to know if you have any of these conditions:  eye disease, vision problems  low levels of calcium, magnesium, or potassium in the blood  lung or breathing disease, like asthma  skin conditions or sensitivity  an unusual or allergic reaction to panitumumab, other medicines, foods, dyes, or preservatives  pregnant or trying to get pregnant  breast-feeding How should I use this medicine? This drug is given as  an infusion into a vein. It is administered in a hospital or clinic by a specially trained health care professional. Talk to your pediatrician regarding the use of this medicine in children. Special care may be needed. Overdosage: If you think you have taken too much of this medicine contact a poison control center or emergency room at once. NOTE: This medicine is only for you. Do not share this medicine with others. What if I miss a dose? It is important not to miss your dose. Call your doctor or health care professional if you are unable to keep an appointment. What may interact with this medicine? Do not take this medicine with any of the following medications:  bevacizumab This list may not describe all possible interactions. Give your health care provider a list of all the medicines, herbs, non-prescription drugs, or dietary supplements you use. Also tell them if you smoke, drink alcohol, or use illegal drugs. Some items may interact with your medicine. What should I watch for while using this medicine? Visit your doctor for checks on your progress. This drug may make you feel generally unwell. This is not uncommon, as chemotherapy can affect healthy cells as well as cancer cells. Report any side effects. Continue your course of treatment even though you feel ill unless your doctor tells you to stop. This medicine can make you more sensitive to the sun. Keep out of the sun while receiving this medicine and for 2 months after the last dose. If you cannot avoid being in the sun, wear protective clothing and use sunscreen. Do not use sun lamps or tanning beds/booths. In some cases,  you may be given additional medicines to help with side effects. Follow all directions for their use. Call your doctor or health care professional for advice if you get a fever, chills or sore throat, or other symptoms of a cold or flu. Do not treat yourself. This drug decreases your body's ability to fight infections.  Try to avoid being around people who are sick. Avoid taking products that contain aspirin, acetaminophen, ibuprofen, naproxen, or ketoprofen unless instructed by your doctor. These medicines may hide a fever. Do not become pregnant while taking this medicine and for 2 months after the last dose. Women should inform their doctor if they wish to become pregnant or think they might be pregnant. There is a potential for serious side effects to an unborn child. Talk to your health care professional or pharmacist for more information. Do not breast-feed an infant while taking this medicine or for 2 months after the last dose. What side effects may I notice from receiving this medicine? Side effects that you should report to your doctor or health care professional as soon as possible:  allergic reactions like skin rash, itching or hives, swelling of the face, lips, or tongue  breathing problems  changes in vision  eye pain  fast, irregular heartbeat  fever, chills  mouth sores  red spots on the skin  redness, blistering, peeling or loosening of the skin, including inside the mouth  signs and symptoms of kidney injury like trouble passing urine or change in the amount of urine  signs and symptoms of low blood pressure like dizziness; feeling faint or lightheaded, falls; unusually weak or tired  signs of low calcium like fast heartbeat, muscle cramps or muscle pain; pain, tingling, numbness in the hands or feet; seizures  signs and symptoms of low magnesium like muscle cramps, pain, or weakness; tremors; seizures; or fast, irregular heartbeat  signs and symptoms of low potassium like muscle cramps or muscle pain; chest pain; dizziness; feeling faint or lightheaded, falls; palpitations; breathing problems; or fast, irregular heartbeat  swelling of the ankles, feet, hands Side effects that usually do not require medical attention (report to your doctor or health care professional if they  continue or are bothersome):  changes in skin like acne, cracks, skin dryness  diarrhea  eyelash growth  headache  mouth sores  nail changes  nausea, vomiting This list may not describe all possible side effects. Call your doctor for medical advice about side effects. You may report side effects to FDA at 1-800-FDA-1088. Where should I keep my medicine? This drug is given in a hospital or clinic and will not be stored at home. NOTE: This sheet is a summary. It may not cover all possible information. If you have questions about this medicine, talk to your doctor, pharmacist, or health care provider.  2021 Elsevier/Gold Standard (2015-08-12 16:45:04)  Irinotecan injection What is this medicine? IRINOTECAN (ir in oh TEE kan ) is a chemotherapy drug. It is used to treat colon and rectal cancer. This medicine may be used for other purposes; ask your health care provider or pharmacist if you have questions. COMMON BRAND NAME(S): Camptosar What should I tell my health care provider before I take this medicine? They need to know if you have any of these conditions:  dehydration  diarrhea  infection (especially a virus infection such as chickenpox, cold sores, or herpes)  liver disease  low blood counts, like low white cell, platelet, or red cell counts  low levels of  calcium, magnesium, or potassium in the blood  recent or ongoing radiation therapy  an unusual or allergic reaction to irinotecan, other medicines, foods, dyes, or preservatives  pregnant or trying to get pregnant  breast-feeding How should I use this medicine? This drug is given as an infusion into a vein. It is administered in a hospital or clinic by a specially trained health care professional. Talk to your pediatrician regarding the use of this medicine in children. Special care may be needed. Overdosage: If you think you have taken too much of this medicine contact a poison control center or emergency  room at once. NOTE: This medicine is only for you. Do not share this medicine with others. What if I miss a dose? It is important not to miss your dose. Call your doctor or health care professional if you are unable to keep an appointment. What may interact with this medicine? Do not take this medicine with any of the following medications:  cobicistat  itraconazole This medicine may interact with the following medications:  antiviral medicines for HIV or AIDS  certain antibiotics like rifampin or rifabutin  certain medicines for fungal infections like ketoconazole, posaconazole, and voriconazole  certain medicines for seizures like carbamazepine, phenobarbital, phenotoin  clarithromycin  gemfibrozil  nefazodone  St. John's Wort This list may not describe all possible interactions. Give your health care provider a list of all the medicines, herbs, non-prescription drugs, or dietary supplements you use. Also tell them if you smoke, drink alcohol, or use illegal drugs. Some items may interact with your medicine. What should I watch for while using this medicine? Your condition will be monitored carefully while you are receiving this medicine. You will need important blood work done while you are taking this medicine. This drug may make you feel generally unwell. This is not uncommon, as chemotherapy can affect healthy cells as well as cancer cells. Report any side effects. Continue your course of treatment even though you feel ill unless your doctor tells you to stop. In some cases, you may be given additional medicines to help with side effects. Follow all directions for their use. You may get drowsy or dizzy. Do not drive, use machinery, or do anything that needs mental alertness until you know how this medicine affects you. Do not stand or sit up quickly, especially if you are an older patient. This reduces the risk of dizzy or fainting spells. Call your health care professional for  advice if you get a fever, chills, or sore throat, or other symptoms of a cold or flu. Do not treat yourself. This medicine decreases your body's ability to fight infections. Try to avoid being around people who are sick. Avoid taking products that contain aspirin, acetaminophen, ibuprofen, naproxen, or ketoprofen unless instructed by your doctor. These medicines may hide a fever. This medicine may increase your risk to bruise or bleed. Call your doctor or health care professional if you notice any unusual bleeding. Be careful brushing and flossing your teeth or using a toothpick because you may get an infection or bleed more easily. If you have any dental work done, tell your dentist you are receiving this medicine. Do not become pregnant while taking this medicine or for 6 months after stopping it. Women should inform their health care professional if they wish to become pregnant or think they might be pregnant. Men should not father a child while taking this medicine and for 3 months after stopping it. There is potential for serious  side effects to an unborn child. Talk to your health care professional for more information. Do not breast-feed an infant while taking this medicine or for 7 days after stopping it. This medicine has caused ovarian failure in some women. This medicine may make it more difficult to get pregnant. Talk to your health care professional if you are concerned about your fertility. This medicine has caused decreased sperm counts in some men. This may make it more difficult to father a child. Talk to your health care professional if you are concerned about your fertility. What side effects may I notice from receiving this medicine? Side effects that you should report to your doctor or health care professional as soon as possible:  allergic reactions like skin rash, itching or hives, swelling of the face, lips, or tongue  chest pain  diarrhea  flushing, runny nose, sweating  during infusion  low blood counts - this medicine may decrease the number of white blood cells, red blood cells and platelets. You may be at increased risk for infections and bleeding.  nausea, vomiting  pain, swelling, warmth in the leg  signs of decreased platelets or bleeding - bruising, pinpoint red spots on the skin, black, tarry stools, blood in the urine  signs of infection - fever or chills, cough, sore throat, pain or difficulty passing urine  signs of decreased red blood cells - unusually weak or tired, fainting spells, lightheadedness Side effects that usually do not require medical attention (report to your doctor or health care professional if they continue or are bothersome):  constipation  hair loss  headache  loss of appetite  mouth sores  stomach pain This list may not describe all possible side effects. Call your doctor for medical advice about side effects. You may report side effects to FDA at 1-800-FDA-1088. Where should I keep my medicine? This drug is given in a hospital or clinic and will not be stored at home. NOTE: This sheet is a summary. It may not cover all possible information. If you have questions about this medicine, talk to your doctor, pharmacist, or health care provider.  2021 Elsevier/Gold Standard (2018-12-23 17:46:13)

## 2020-07-01 DIAGNOSIS — E119 Type 2 diabetes mellitus without complications: Secondary | ICD-10-CM | POA: Diagnosis not present

## 2020-07-02 ENCOUNTER — Inpatient Hospital Stay: Payer: BC Managed Care – PPO

## 2020-07-02 ENCOUNTER — Other Ambulatory Visit: Payer: Self-pay

## 2020-07-02 VITALS — BP 129/83 | HR 90 | Temp 98.9°F | Resp 17

## 2020-07-02 DIAGNOSIS — D6959 Other secondary thrombocytopenia: Secondary | ICD-10-CM | POA: Diagnosis not present

## 2020-07-02 DIAGNOSIS — Z5111 Encounter for antineoplastic chemotherapy: Secondary | ICD-10-CM | POA: Diagnosis not present

## 2020-07-02 DIAGNOSIS — D509 Iron deficiency anemia, unspecified: Secondary | ICD-10-CM | POA: Diagnosis not present

## 2020-07-02 DIAGNOSIS — C182 Malignant neoplasm of ascending colon: Secondary | ICD-10-CM | POA: Diagnosis not present

## 2020-07-02 DIAGNOSIS — G620 Drug-induced polyneuropathy: Secondary | ICD-10-CM | POA: Diagnosis not present

## 2020-07-02 DIAGNOSIS — Z5112 Encounter for antineoplastic immunotherapy: Secondary | ICD-10-CM | POA: Diagnosis not present

## 2020-07-02 DIAGNOSIS — C787 Secondary malignant neoplasm of liver and intrahepatic bile duct: Secondary | ICD-10-CM | POA: Diagnosis not present

## 2020-07-02 DIAGNOSIS — Z5189 Encounter for other specified aftercare: Secondary | ICD-10-CM | POA: Diagnosis not present

## 2020-07-02 DIAGNOSIS — C7961 Secondary malignant neoplasm of right ovary: Secondary | ICD-10-CM | POA: Diagnosis not present

## 2020-07-02 MED ORDER — PEGFILGRASTIM-CBQV 6 MG/0.6ML ~~LOC~~ SOSY
6.0000 mg | PREFILLED_SYRINGE | Freq: Once | SUBCUTANEOUS | Status: AC
  Administered 2020-07-02: 6 mg via SUBCUTANEOUS

## 2020-07-05 ENCOUNTER — Encounter: Payer: Self-pay | Admitting: Oncology

## 2020-07-07 ENCOUNTER — Other Ambulatory Visit: Payer: Self-pay | Admitting: Oncology

## 2020-07-14 ENCOUNTER — Other Ambulatory Visit: Payer: Self-pay | Admitting: Oncology

## 2020-07-14 DIAGNOSIS — C182 Malignant neoplasm of ascending colon: Secondary | ICD-10-CM

## 2020-07-14 DIAGNOSIS — C787 Secondary malignant neoplasm of liver and intrahepatic bile duct: Secondary | ICD-10-CM

## 2020-07-17 ENCOUNTER — Other Ambulatory Visit: Payer: Self-pay | Admitting: Oncology

## 2020-07-19 ENCOUNTER — Ambulatory Visit (HOSPITAL_BASED_OUTPATIENT_CLINIC_OR_DEPARTMENT_OTHER)
Admission: RE | Admit: 2020-07-19 | Discharge: 2020-07-19 | Disposition: A | Discharge status: Home / Self Care | Payer: BC Managed Care – PPO | Source: Ambulatory Visit | Attending: Oncology | Admitting: Oncology

## 2020-07-19 ENCOUNTER — Inpatient Hospital Stay: Payer: BC Managed Care – PPO | Attending: Oncology

## 2020-07-19 ENCOUNTER — Other Ambulatory Visit: Payer: Self-pay

## 2020-07-19 ENCOUNTER — Encounter (HOSPITAL_BASED_OUTPATIENT_CLINIC_OR_DEPARTMENT_OTHER): Payer: Self-pay

## 2020-07-19 DIAGNOSIS — D6959 Other secondary thrombocytopenia: Secondary | ICD-10-CM | POA: Insufficient documentation

## 2020-07-19 DIAGNOSIS — C182 Malignant neoplasm of ascending colon: Secondary | ICD-10-CM | POA: Diagnosis not present

## 2020-07-19 DIAGNOSIS — C7961 Secondary malignant neoplasm of right ovary: Secondary | ICD-10-CM | POA: Diagnosis not present

## 2020-07-19 DIAGNOSIS — D509 Iron deficiency anemia, unspecified: Secondary | ICD-10-CM | POA: Diagnosis not present

## 2020-07-19 DIAGNOSIS — Z5111 Encounter for antineoplastic chemotherapy: Secondary | ICD-10-CM | POA: Insufficient documentation

## 2020-07-19 DIAGNOSIS — K760 Fatty (change of) liver, not elsewhere classified: Secondary | ICD-10-CM | POA: Diagnosis not present

## 2020-07-19 DIAGNOSIS — Z5189 Encounter for other specified aftercare: Secondary | ICD-10-CM | POA: Insufficient documentation

## 2020-07-19 DIAGNOSIS — Z5112 Encounter for antineoplastic immunotherapy: Secondary | ICD-10-CM | POA: Diagnosis not present

## 2020-07-19 DIAGNOSIS — Z452 Encounter for adjustment and management of vascular access device: Secondary | ICD-10-CM | POA: Insufficient documentation

## 2020-07-19 MED ORDER — IOHEXOL 300 MG/ML  SOLN
75.0000 mL | Freq: Once | INTRAMUSCULAR | Status: AC | PRN
  Administered 2020-07-19: 75 mL via INTRAVENOUS

## 2020-07-21 ENCOUNTER — Telehealth: Payer: Self-pay | Admitting: *Deleted

## 2020-07-21 ENCOUNTER — Encounter: Payer: Self-pay | Admitting: Nurse Practitioner

## 2020-07-21 ENCOUNTER — Other Ambulatory Visit: Payer: Self-pay

## 2020-07-21 ENCOUNTER — Inpatient Hospital Stay: Payer: BC Managed Care – PPO

## 2020-07-21 ENCOUNTER — Inpatient Hospital Stay (HOSPITAL_BASED_OUTPATIENT_CLINIC_OR_DEPARTMENT_OTHER): Payer: BC Managed Care – PPO | Admitting: Nurse Practitioner

## 2020-07-21 ENCOUNTER — Other Ambulatory Visit: Payer: Self-pay | Admitting: Oncology

## 2020-07-21 VITALS — BP 137/81 | HR 82 | Temp 97.8°F | Resp 20 | Ht 66.0 in | Wt 161.0 lb

## 2020-07-21 DIAGNOSIS — C182 Malignant neoplasm of ascending colon: Secondary | ICD-10-CM

## 2020-07-21 DIAGNOSIS — C7961 Secondary malignant neoplasm of right ovary: Secondary | ICD-10-CM | POA: Diagnosis not present

## 2020-07-21 DIAGNOSIS — D509 Iron deficiency anemia, unspecified: Secondary | ICD-10-CM | POA: Diagnosis not present

## 2020-07-21 DIAGNOSIS — Z5111 Encounter for antineoplastic chemotherapy: Secondary | ICD-10-CM | POA: Diagnosis not present

## 2020-07-21 DIAGNOSIS — C787 Secondary malignant neoplasm of liver and intrahepatic bile duct: Secondary | ICD-10-CM | POA: Diagnosis not present

## 2020-07-21 DIAGNOSIS — Z452 Encounter for adjustment and management of vascular access device: Secondary | ICD-10-CM | POA: Diagnosis not present

## 2020-07-21 DIAGNOSIS — D6959 Other secondary thrombocytopenia: Secondary | ICD-10-CM | POA: Diagnosis not present

## 2020-07-21 DIAGNOSIS — Z5112 Encounter for antineoplastic immunotherapy: Secondary | ICD-10-CM | POA: Diagnosis not present

## 2020-07-21 DIAGNOSIS — Z5189 Encounter for other specified aftercare: Secondary | ICD-10-CM | POA: Diagnosis not present

## 2020-07-21 LAB — CBC WITH DIFFERENTIAL (CANCER CENTER ONLY)
Abs Immature Granulocytes: 0.02 10*3/uL (ref 0.00–0.07)
Basophils Absolute: 0 10*3/uL (ref 0.0–0.1)
Basophils Relative: 1 %
Eosinophils Absolute: 0.1 10*3/uL (ref 0.0–0.5)
Eosinophils Relative: 3 %
HCT: 37.1 % (ref 36.0–46.0)
Hemoglobin: 11.4 g/dL — ABNORMAL LOW (ref 12.0–15.0)
Immature Granulocytes: 1 %
Lymphocytes Relative: 20 %
Lymphs Abs: 0.9 10*3/uL (ref 0.7–4.0)
MCH: 24 pg — ABNORMAL LOW (ref 26.0–34.0)
MCHC: 30.7 g/dL (ref 30.0–36.0)
MCV: 78.1 fL — ABNORMAL LOW (ref 80.0–100.0)
Monocytes Absolute: 0.3 10*3/uL (ref 0.1–1.0)
Monocytes Relative: 8 %
Neutro Abs: 3 10*3/uL (ref 1.7–7.7)
Neutrophils Relative %: 67 %
Platelet Count: 172 10*3/uL (ref 150–400)
RBC: 4.75 MIL/uL (ref 3.87–5.11)
RDW: 15.8 % — ABNORMAL HIGH (ref 11.5–15.5)
WBC Count: 4.3 10*3/uL (ref 4.0–10.5)
nRBC: 0 % (ref 0.0–0.2)

## 2020-07-21 LAB — CMP (CANCER CENTER ONLY)
ALT: 40 U/L (ref 0–44)
AST: 26 U/L (ref 15–41)
Albumin: 4 g/dL (ref 3.5–5.0)
Alkaline Phosphatase: 115 U/L (ref 38–126)
Anion gap: 8 (ref 5–15)
BUN: 16 mg/dL (ref 6–20)
CO2: 27 mmol/L (ref 22–32)
Calcium: 9.3 mg/dL (ref 8.9–10.3)
Chloride: 103 mmol/L (ref 98–111)
Creatinine: 0.64 mg/dL (ref 0.44–1.00)
GFR, Estimated: >60 mL/min (ref >60)
Glucose, Bld: 212 mg/dL — ABNORMAL HIGH (ref 70–99)
Potassium: 4 mmol/L (ref 3.5–5.1)
Sodium: 138 mmol/L (ref 135–145)
Total Bilirubin: 0.6 mg/dL (ref 0.3–1.2)
Total Protein: 7 g/dL (ref 6.5–8.1)

## 2020-07-21 LAB — CEA (ACCESS): CEA (CHCC): 24.61 ng/mL — ABNORMAL HIGH (ref 0.00–5.00)

## 2020-07-21 LAB — MAGNESIUM: Magnesium: 1.7 mg/dL (ref 1.7–2.4)

## 2020-07-21 MED ORDER — SODIUM CHLORIDE 0.9% FLUSH
10.0000 mL | INTRAVENOUS | Status: DC | PRN
  Administered 2020-07-21: 10 mL
  Filled 2020-07-21: qty 10

## 2020-07-21 MED ORDER — SODIUM CHLORIDE 0.9 % IV SOLN
Freq: Once | INTRAVENOUS | Status: AC
Start: 2020-07-21 — End: 2020-07-21
  Filled 2020-07-21: qty 250

## 2020-07-21 MED ORDER — SODIUM CHLORIDE 0.9 % IV SOLN: 5.0000 mg | Freq: Once | INTRAVENOUS | Status: DC

## 2020-07-21 MED ORDER — PALONOSETRON HCL INJECTION 0.25 MG/5ML
0.2500 mg | Freq: Once | INTRAVENOUS | Status: AC
  Administered 2020-07-21: 0.25 mg via INTRAVENOUS
  Filled 2020-07-21: qty 5

## 2020-07-21 MED ORDER — DEXAMETHASONE SODIUM PHOSPHATE 10 MG/ML IJ SOLN
5.0000 mg | Freq: Once | INTRAMUSCULAR | Status: AC
  Administered 2020-07-21: 5 mg via INTRAVENOUS
  Filled 2020-07-21: qty 1

## 2020-07-21 MED ORDER — SODIUM CHLORIDE 0.9 % IV SOLN
6.0000 mg/kg | Freq: Once | INTRAVENOUS | Status: AC
  Administered 2020-07-21: 400 mg via INTRAVENOUS
  Filled 2020-07-21: qty 20

## 2020-07-21 MED ORDER — ATROPINE SULFATE 1 MG/ML IJ SOLN
0.5000 mg | Freq: Once | INTRAMUSCULAR | Status: AC | PRN
  Administered 2020-07-21: 0.5 mg via INTRAVENOUS
  Filled 2020-07-21: qty 1

## 2020-07-21 MED ORDER — DIPHENOXYLATE-ATROPINE 2.5-0.025 MG PO TABS: ORAL_TABLET | ORAL | 1 refills | Status: DC

## 2020-07-21 MED ORDER — FLUTICASONE PROPIONATE 0.05 % EX CREA: TOPICAL_CREAM | Freq: Two times a day (BID) | CUTANEOUS | 1 refills | Status: DC

## 2020-07-21 MED ORDER — HEPARIN SOD (PORK) LOCK FLUSH 100 UNIT/ML IV SOLN
500.0000 [IU] | Freq: Once | INTRAVENOUS | Status: AC | PRN
  Administered 2020-07-21: 500 [IU]
  Filled 2020-07-21: qty 5

## 2020-07-21 MED ORDER — SODIUM CHLORIDE 0.9 % IV SOLN
140.0000 mg/m2 | Freq: Once | INTRAVENOUS | Status: AC
  Administered 2020-07-21: 260 mg via INTRAVENOUS
  Filled 2020-07-21: qty 13

## 2020-07-21 NOTE — Telephone Encounter (Signed)
Left VM for CMA w/Dr. [name] requesting she have glucose monitoring supplies ordered and patient has been trying to get a f/u appointment there. Her May visit was cancelled per office.

## 2020-07-21 NOTE — Progress Notes (Signed)
[place] OFFICE PROGRESS NOTE   Diagnosis: Colon cancer  INTERVAL HISTORY:   [name] returns as scheduled.  She completed another cycle of irinotecan/Panitumumab 06/30/2020.  She has mild intermittent nausea.  No mouth sores.  Diarrhea controlled with antidiarrheals.  Overall good appetite.  Rash is stable.  She denies bleeding.  Objective:  Vital signs in last 24 hours:  Blood pressure 137/81, pulse 82, temperature 97.8 F (36.6 C), temperature source Oral, resp. rate 20, height 5' 6"  (1.676 m), weight 161 lb (73 kg), SpO2 92 %.    HEENT: No thrush or ulcers. Resp: Lungs clear bilaterally. Cardio: Regular rate and rhythm. GI: Abdomen soft and nontender.  No hepatomegaly. Vascular: No leg edema.  Calves soft and nontender. Skin: Palms without erythema.  A few acne type lesions scattered over the trunk.   Lab Results:  Lab Results  Component Value Date   WBC 4.3 07/21/2020   HGB 11.4 (L) 07/21/2020   HCT 37.1 07/21/2020   MCV 78.1 (L) 07/21/2020   PLT 172 07/21/2020   NEUTROABS 3.0 07/21/2020    Imaging:  CT ABDOMEN PELVIS W CONTRAST  Result Date: 07/19/2020 CLINICAL DATA:  Follow-up metastatic colon carcinoma. EXAM: CT ABDOMEN AND PELVIS WITH CONTRAST TECHNIQUE: Multidetector CT imaging of the abdomen and pelvis was performed using the standard protocol following bolus administration of intravenous contrast. CONTRAST:  70m OMNIPAQUE IOHEXOL 300 MG/ML  [place]  [place]: No acute findings. Hepatobiliary: Hepatic steatosis shows mild increase since prior exam. Stable 8 mm calcification in the anterior superior left hepatic lobe, consistent with treated metastatic disease. A low-attenuation mass is seen in segment 2 of the left lobe which measures 2.8 x 2.5 cm on image 12/2, which appears increased in size and conspicuity since previous study. No other hepatic masses are identified.  Gallbladder is unremarkable. No evidence of biliary ductal dilatation. Pancreas:  No mass or inflammatory changes. Spleen: Stable splenomegaly.  No splenic masses identified. Adrenals/Urinary Tract: No masses identified. No evidence of ureteral calculi or hydronephrosis. Stomach/Bowel: Surgical anastomosis again seen at the rectosigmoid junction, splenic flexure, and ascending colon. Stable presacral soft tissue density, consistent with post treatment changes. Vascular/Lymphatic: No pathologically enlarged lymph nodes. No acute vascular findings. Reproductive: Prior hysterectomy noted. Adnexal regions are unremarkable in appearance. Other:  None. Musculoskeletal:  No suspicious bone lesions identified. IMPRESSION: Mildly increased hepatic steatosis. Increased size of 2.8 cm hypovascular lesion in segment 2 of left hepatic lobe, suspicious for hepatic metastasis. Consider abdomen MRI without and with contrast for further characterization. Stable splenomegaly. Electronically Signed   By: JMarlaine HindM.D.   On: 07/19/2020 14:41    Medications: I have reviewed the patient's current medications.  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 The tumor returned microsatellite stable with no loss of mismatch repair protein expression   APC mutated. No BRAF, KRAS, or NRAS mutation On [facility] 1 testing   Cycle 1 adjuvant FOLFOX 09/08/2013   Cycle 2 adjuvant FOLFOX 09/24/2013   Cycle 3 adjuvant FOLFOX 10/08/2013.   Cycle 4 adjuvant FOLFOX 10/22/2013.   Cycle 5 adjuvant FOLFOX 11/05/2013. Oxaliplatin held due to thrombocytopenia. Cycle 6 FOLFOX 11/19/2013. Cycle 7 FOLFOX 12/03/2013. Oxaliplatin held secondary to thrombocytopenia. Cycle 8 FOLFOX 12/17/2013. Cycle 9 FOLFOX 01/04/2014. Oxaliplatin held secondary to neutropenia.   Cycle 10 FOLFOX 01/21/2014. Oxaliplatin held secondary to  thrombocytopenia. Cycle 11  FOLFOX 02/04/2014 Cycle 12 FOLFOX 02/18/2014, oxaliplatin dose reduced secondary tothrombocytopenia CT abdomen/pelvis 01/30/2014 revealed splenomegaly and no evidence of recurrent colon cancer CT chest 04/07/2014 with a stable right lower lobe nodule and no evidence for metastatic disease, no nodules seen on the CT 11/26/2014 Markedly elevated CEA 11/24/2014 CT 11/26/2014 revealed a right pelvic mass, splenomegaly, small volume ascites Right salpingo-oophorectomy 12/28/2014 with the pathology confirming metastatic colon cancer CTs 03/23/2016-no evidence of recurrent or metastatic disease. CT 11/26/2016-enlargement of a fluid density structure the right pelvic sidewall, no other evidence of metastatic disease CT aspiration right pelvic cyst 12/19/2016.  Cytology-BENIGN REACTIVE/REPARATIVE CHANGES. CTs 06/05/2017- no evidence of metastatic disease, mild cirrhotic changes with splenomegaly CTs 12/11/2017- recurrent cystic right adnexal mass, similar to on the CT 11/26/2016, stable mild splenomegaly CTs 04/23/2018- enlargement of cystic right adnexal mass with mural nodularity, no other evidence of metastatic disease Cytoreductive surgery/HIPEC with mitomycin by Dr. Clovis Riley at [facility] 08/12/2018-R1 resection achieved.  Cytoreduction included omentectomy, LAR, right salpingo-oophorectomy and left colonic gutter/pelvic stripping.  Pathology on the rectum showed recurrent/metastatic adenocarcinoma, tumor 2.0 cm, predominantly involving the subserosa and muscularis propria of the colon, proximal and distal margins of resection were negative, vascular invasion present, metastatic carcinoma present in 1 out of 5 lymph nodes; omentum resection with no malignancy seen, no metastatic carcinoma identified in 1 lymph node examined; left gutter stripping positive metastatic adenocarcinoma; right ovary resection positive metastatic adenocarcinoma. 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 CEA 68 02/03/2019 Biopsy liver lesion 02/11/2019-metastatic adenocarcinoma consistent with primary colonic adenocarcinoma CTs 02/27/2019-multiple liver lesions increased in size, new lesion in the lateral right lobe of the liver.  No evidence of metastatic disease in the chest.  Redemonstrated moderate left hydronephrosis and proximal hydroureter without discrete lesion or obstructing etiology at the transition point of the mid ureter. Cycle 1 FOLFIRI/Panitumumab 03/12/2019 Cycle 2 FOLFIRI/Panitumumab 03/26/2019-bolus 5-FU and irinotecan held secondary to neutropenia Cycle 3 FOLFIRI/Panitumumab 04/09/2019, Udenyca added-not given secondary to seizure/discontinuation of the 5-FU pump CT abdomen/pelvis at [facility] 05/04/2019-no residual fluid collection at the left abdominal wall abscess, stable moderate left hydronephrosis, multifocal indeterminate liver lesions--liver lesions significantly improved Cycle 4 FOLFIRI/Panitumumab 05/07/2019, Udenyca Cycle 5 FOLFIRI/Panitumumab 05/20/2019, Udenyca Cycle 6 FOLFIRI/Panitumumab 06/18/2019, Udenyca Cycle 7 FOLFIRI/Panitumumab 07/01/2019 (Irinotecan dose reduced due to thrombocytopenia), Udenyca--Udenyca was not given Cycle 8 FOLFIRI/Panitumumab 07/15/2019, Udenyca Cycle 9 FOLFIRI/Panitumumab 07/29/2019, Udenyca Cycle 10 FOLFIRI/Panitumumab 08/13/2019, Udenyca CTs at [facility] 08/19/2019-decreased size of the hypodense and hyperdense lesions within segment 2 of the left hepatic lobe.  No new lesions identified.  Similar presacral soft tissue thickening with adjacent stable alignment in the rectum.  Improved but persistent left UPJ obstruction with mild left hydronephrosis.  Persistent but decreased size of the wound within the left mid abdomen abdominal wall. Cycle 11 irinotecan/Panitumumab 08/27/2019, Udenyca Cycle 12 irinotecan/Panitumumab 10/08/2019, Udenyca Cycle 13 irinotecan/Panitumumab  11/05/2019, Udenyca CT abdomen/pelvis 11/19/2019-no new or progressive interval findings.  Stable appearance of the calcified lesion in the anterior left hepatic dome with second tiny low-density lesion in the dome of the lateral segment left liver.  Both lesions are markedly decreased since 02/27/2019.  Stable mild fullness left intrarenal collecting system and renal pelvis.  Similar appearance of abnormal soft tissue in the left paramidline subcutaneous fat with associated skin thickening, this likely correlates to the fistula. Cycle 14 irinotecan/Panitumumab 11/19/2019, Udenyca Cycle 15 irinotecan/Panitumumab 12/02/2019, Udenyca CT abdomen/pelvis at [facility] 02/11/2020- similar to slight decrease in segment 2 hypodense and  hyperdense lesions (compared to a CT from July 2020), no new lesions, splenomegaly, no fluid collection or abscess, mild stranding adjacent to the incision with overlying skin thickening Cycle 16 irinotecan/Panitumumab 03/03/2020, Udenyca Cycle 17 irinotecan/Panitumumab 03/17/2020, Udenyca Cycle 18 irinotecan/panitumumab 04/07/2020, Udenyca Cycle 19 irinotecan/Panitumumab 04/28/2020, Udenyca Cycle 20 irinotecan/panitumumab 05/26/2020, Udenyca Cycle 21 irinotecan/panitumumab 06/30/2020, Udenyca CT abdomen/pelvis 07/19/2020-mildly increased hepatic steatosis.  Increased size of 2.8 cm hypervascular lesion segment 2 left hepatic lobe. Mild elevation of the CEA beginning January 2016 , normal on 05/19/2014   History of iron deficiency anemia   seizure disorder; seizure 04/09/2019.  Brain CT negative.  Now on Keppra. history of depression   4 mm right lower lobe nodule on a staging chest CT 09/08/2013 , stable on a CT 04/07/2014  Hospitalization 09/24/2013 through 09/26/2013 with fever and abdominal pain.   09/24/2013 urine culture positive for coag negative staph.   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  Scope advanced for approximately 25 cm.  Most proximal portion had necrotic appearing debris.  Colostomy bag insufflated suggesting some type of communication between the pouch and the proximal colon.  Introduction of scope into the ostomy found to available directions.  1 toward the distal pouch with similar appearing necrotic debris encountered.  The other was about a 30 cm segment of normal-appearing colonic mucosa to the level of the previous right hemicolectomy ileocolonic anastomosis. Port-A-Cath placement interventional radiology 03/05/2019 Left leg/foot weakness-brain MRI 09/03/2019 with no evidence of metastatic disease, referred to physical therapy Possible abdominal wall abscess status post evaluation by surgery at [facility] 04/03/2019, antibiotics initiated; incision and drainage with purulent material removed 04/22/2019 CT at [facility] 05/04/2019-no fluid collection at the site of the left abdominal wall abscess, "indeterminate" liver lesions CT at [facility] 06/04/2019-persistent open wound of the left abdomen, 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  No malignancy.   16. COVID-19 + 09/10/2019 17. Hospitalized with seizure 09/17/2019 through 09/19/2019-MRI brain without evidence of metastatic disease. Seen by neurology. Keppra dose increased. 19.  Rash secondary to Panitumumab      Disposition: [name] appears stable.  She is on active treatment with irinotecan/Panitumumab.  There is no clinical evidence of disease progression.  Recent restaging CTs show a single liver lesion, increased in size.  No new disease.  Dr. Benay Spice reviewed the CT report/images with [name] and her  mother by video at today's appointment.  Dr. Gearldine Shown recommendation is to continue the current treatment, resume a 2-week schedule, and refer for an MRI of the liver to see if she may be a candidate for hepatic directed therapy.  She agrees with this plan.  We will proceed with treatment today as scheduled.  We reviewed the CBC and chemistry panel from today.  Labs adequate to proceed.  She will return for lab, follow-up, irinotecan/Panitumumab in 2 weeks.  She will contact the office in the interim with any problems.  Patient seen with Dr. Benay Spice by video.Ned Card ANP/GNP-BC   07/21/2020  8:54 AM This was a shared visit with Ned Card.  [name] metastatic colon cancer.  We reviewed the restaging CT results and images with her.  There is a single visible liver lesion that has enlarged  in size.  No evidence of disease progression.  The CEA has changed significantly over the past several months.  Chemotherapy has not been administered consistently since the last CT.  We decided to continue irinotecan/panitumumab on a 2-week schedule.  She will be referred for a liver MRI to look for evidence of additional lesions.  If there is no other evidence of metastatic disease we will consider referring her for ablation of the liver lesion.  I was present for greater than 50% of today's visit.  I performed medical decision making.  Julieanne Manson, MD

## 2020-07-21 NOTE — Patient Instructions (Signed)
Implanted Port Home Guide An implanted port is a device that is placed under the skin. It is usually placed in the chest. The device can be used to give IV medicine, to take blood, or for dialysis. You may have an implanted port if: You need IV medicine that would be irritating to the small veins in your hands or arms. You need IV medicines, such as antibiotics, for a long period of time. You need IV nutrition for a long period of time. You need dialysis. When you have a port, your health care provider can choose to use the port instead of veins in your arms for these procedures. You may have fewer limitations when using a port than you would if you used other types of long-term IVs, and you will likely be able to return to normal activities afteryour incision heals. An implanted port has two main parts: Reservoir. The reservoir is the part where a needle is inserted to give medicines or draw blood. The reservoir is round. After it is placed, it appears as a small, raised area under your skin. Catheter. The catheter is a thin, flexible tube that connects the reservoir to a vein. Medicine that is inserted into the reservoir goes into the catheter and then into the vein. How is my port accessed? To access your port: A numbing cream may be placed on the skin over the port site. Your health care provider will put on a mask and sterile gloves. The skin over your port will be cleaned carefully with a germ-killing soap and allowed to dry. Your health care provider will gently pinch the port and insert a needle into it. Your health care provider will check for a blood return to make sure the port is in the vein and is not clogged. If your port needs to remain accessed to get medicine continuously (constant infusion), your health care provider will place a clear bandage (dressing) over the needle site. The dressing and needle will need to be changed every week, or as told by your health care provider. What  is flushing? Flushing helps keep the port from getting clogged. Follow instructions from your health care provider about how and when to flush the port. Ports are usually flushed with saline solution or a medicine called heparin. The need for flushing will depend on how the port is used: If the port is only used from time to time to give medicines or draw blood, the port may need to be flushed: Before and after medicines have been given. Before and after blood has been drawn. As part of routine maintenance. Flushing may be recommended every 4-6 weeks. If a constant infusion is running, the port may not need to be flushed. Throw away any syringes in a disposal container that is meant for sharp items (sharps container). You can buy a sharps container from a pharmacy, or you can make one by using an empty hard plastic bottle with a cover. How long will my port stay implanted? The port can stay in for as long as your health care provider thinks it is needed. When it is time for the port to come out, a surgery will be done to remove it. The surgery will be similar to the procedure that was done to putthe port in. Follow these instructions at home:  Flush your port as told by your health care provider. If you need an infusion over several days, follow instructions from your health care provider about how to take   care of your port site. Make sure you: Wash your hands with soap and water before you change your dressing. If soap and water are not available, use alcohol-based hand sanitizer. Change your dressing as told by your health care provider. Place any used dressings or infusion bags into a plastic bag. Throw that bag in the trash. Keep the dressing that covers the needle clean and dry. Do not get it wet. Do not use scissors or sharp objects near the tube. Keep the tube clamped, unless it is being used. Check your port site every day for signs of infection. Check for: Redness, swelling, or  pain. Fluid or blood. Pus or a bad smell. Protect the skin around the port site. Avoid wearing bra straps that rub or irritate the site. Protect the skin around your port from seat belts. Place a soft pad over your chest if needed. Bathe or shower as told by your health care provider. The site may get wet as long as you are not actively receiving an infusion. Return to your normal activities as told by your health care provider. Ask your health care provider what activities are safe for you. Carry a medical alert card or wear a medical alert bracelet at all times. This will let health care providers know that you have an implanted port in case of an emergency. Get help right away if: You have redness, swelling, or pain at the port site. You have fluid or blood coming from your port site. You have pus or a bad smell coming from the port site. You have a fever. Summary Implanted ports are usually placed in the chest for long-term IV access. Follow instructions from your health care provider about flushing the port and changing bandages (dressings). Take care of the area around your port by avoiding clothing that puts pressure on the area, and by watching for signs of infection. Protect the skin around your port from seat belts. Place a soft pad over your chest if needed. Get help right away if you have a fever or you have redness, swelling, pain, drainage, or a bad smell at the port site. This information is not intended to replace advice given to you by your health care provider. Make sure you discuss any questions you have with your healthcare provider. Document Revised: 06/08/2019 Document Reviewed: 06/08/2019 Elsevier Patient Education  2022 [employer]  

## 2020-07-21 NOTE — Patient Instructions (Signed)
[facility] Discharge Instructions for Patients Receiving Chemotherapy  Today you received the following immunotherapy agent: Panitumumab (Vectibix) and chemotherapy agent: Irinotecan  To help prevent nausea and vomiting after your treatment, we encourage you to take your nausea medication as directed by your MD.   If you develop nausea and vomiting that is not controlled by your nausea medication, call the clinic.   BELOW ARE SYMPTOMS THAT SHOULD BE REPORTED IMMEDIATELY: *FEVER GREATER THAN 100.5 F *CHILLS WITH OR WITHOUT FEVER NAUSEA AND VOMITING THAT IS NOT CONTROLLED WITH YOUR NAUSEA MEDICATION *UNUSUAL SHORTNESS OF BREATH *UNUSUAL BRUISING OR BLEEDING TENDERNESS IN MOUTH AND THROAT WITH OR WITHOUT PRESENCE OF ULCERS *URINARY PROBLEMS *BOWEL PROBLEMS UNUSUAL RASH Items with * indicate a potential emergency and should be followed up as soon as possible.  Feel free to call the clinic should you have any questions or concerns. The clinic phone number is [phone].  Please show the [place] at check-in to the Emergency Department and triage nurse.  Panitumumab Solution for Injection What is this medicine? PANITUMUMAB (pan i TOOM ue mab) is a monoclonal antibody. It is used to treat colorectal cancer. This medicine may be used for other purposes; ask your health care provider or pharmacist if you have questions. COMMON BRAND NAME(S): Vectibix What should I tell my health care provider before I take this medicine? They need to know if you have any of these conditions: eye disease, vision problems low levels of calcium, magnesium, or potassium in the blood lung or breathing disease, like asthma skin conditions or sensitivity an unusual or allergic reaction to panitumumab, other medicines, foods, dyes, or preservatives pregnant or trying to get pregnant breast-feeding How should I use this medicine? This drug is given as an infusion into a vein. It is  administered in a hospital or clinic by a specially trained health care professional. Talk to your pediatrician regarding the use of this medicine in children. Special care may be needed. Overdosage: If you think you have taken too much of this medicine contact a poison control center or emergency room at once. NOTE: This medicine is only for you. Do not share this medicine with others. What if I miss a dose? It is important not to miss your dose. Call your doctor or health care professional if you are unable to keep an appointment. What may interact with this medicine? Do not take this medicine with any of the following medications: bevacizumab This list may not describe all possible interactions. Give your health care provider a list of all the medicines, herbs, non-prescription drugs, or dietary supplements you use. Also tell them if you smoke, drink alcohol, or use illegal drugs. Some items may interact with your medicine. What should I watch for while using this medicine? Visit your doctor for checks on your progress. This drug may make you feel generally unwell. This is not uncommon, as chemotherapy can affect healthy cells as well as cancer cells. Report any side effects. Continue your course of treatment even though you feel ill unless your doctor tells you to stop. This medicine can make you more sensitive to the sun. Keep out of the sun while receiving this medicine and for 2 months after the last dose. If you cannot avoid being in the sun, wear protective clothing and use sunscreen. Do not use sun lamps or tanning beds/booths. In some cases, you may be given additional medicines to help with side effects. Follow all directions for their use.  Call your doctor or health care professional for advice if you get a fever, chills or sore throat, or other symptoms of a cold or flu. Do not treat yourself. This drug decreases your body's ability to fight infections. Try to avoid being around people  who are sick. Avoid taking products that contain aspirin, acetaminophen, ibuprofen, naproxen, or ketoprofen unless instructed by your doctor. These medicines may hide a fever. Do not become pregnant while taking this medicine and for 2 months after the last dose. Women should inform their doctor if they wish to become pregnant or think they might be pregnant. There is a potential for serious side effects to an unborn child. Talk to your health care professional or pharmacist for more information. Do not breast-feed an infant while taking this medicine or for 2 months after the last dose. What side effects may I notice from receiving this medicine? Side effects that you should report to your doctor or health care professional as soon as possible: allergic reactions like skin rash, itching or hives, swelling of the face, lips, or tongue breathing problems changes in vision eye pain fast, irregular heartbeat fever, chills mouth sores red spots on the skin redness, blistering, peeling or loosening of the skin, including inside the mouth signs and symptoms of kidney injury like trouble passing urine or change in the amount of urine signs and symptoms of low blood pressure like dizziness; feeling faint or lightheaded, falls; unusually weak or tired signs of low calcium like fast heartbeat, muscle cramps or muscle pain; pain, tingling, numbness in the hands or feet; seizures signs and symptoms of low magnesium like muscle cramps, pain, or weakness; tremors; seizures; or fast, irregular heartbeat signs and symptoms of low potassium like muscle cramps or muscle pain; chest pain; dizziness; feeling faint or lightheaded, falls; palpitations; breathing problems; or fast, irregular heartbeat swelling of the ankles, feet, hands Side effects that usually do not require medical attention (report to your doctor or health care professional if they continue or are bothersome): changes in skin like acne, cracks,  skin dryness diarrhea eyelash growth headache mouth sores nail changes nausea, vomiting This list may not describe all possible side effects. Call your doctor for medical advice about side effects. You may report side effects to FDA at 1-800-FDA-1088. Where should I keep my medicine? This drug is given in a hospital or clinic and will not be stored at home. NOTE: This sheet is a summary. It may not cover all possible information. If you have questions about this medicine, talk to your doctor, pharmacist, or health care provider.  2021 Elsevier/Gold Standard (2015-08-12 16:45:04)  Irinotecan injection What is this medicine? IRINOTECAN (ir in oh TEE kan ) is a chemotherapy drug. It is used to treat colon and rectal cancer. This medicine may be used for other purposes; ask your health care provider or pharmacist if you have questions. COMMON BRAND NAME(S): Camptosar What should I tell my health care provider before I take this medicine? They need to know if you have any of these conditions: dehydration diarrhea infection (especially a virus infection such as chickenpox, cold sores, or herpes) liver disease low blood counts, like low white cell, platelet, or red cell counts low levels of calcium, magnesium, or potassium in the blood recent or ongoing radiation therapy an unusual or allergic reaction to irinotecan, other medicines, foods, dyes, or preservatives pregnant or trying to get pregnant breast-feeding How should I use this medicine? This drug is given as an infusion  into a vein. It is administered in a hospital or clinic by a specially trained health care professional. Talk to your pediatrician regarding the use of this medicine in children. Special care may be needed. Overdosage: If you think you have taken too much of this medicine contact a poison control center or emergency room at once. NOTE: This medicine is only for you. Do not share this medicine with others. What if I  miss a dose? It is important not to miss your dose. Call your doctor or health care professional if you are unable to keep an appointment. What may interact with this medicine? Do not take this medicine with any of the following medications: cobicistat itraconazole This medicine may interact with the following medications: antiviral medicines for HIV or AIDS certain antibiotics like rifampin or rifabutin certain medicines for fungal infections like ketoconazole, posaconazole, and voriconazole certain medicines for seizures like carbamazepine, phenobarbital, phenotoin clarithromycin gemfibrozil nefazodone [facility]'s Wort This list may not describe all possible interactions. Give your health care provider a list of all the medicines, herbs, non-prescription drugs, or dietary supplements you use. Also tell them if you smoke, drink alcohol, or use illegal drugs. Some items may interact with your medicine. What should I watch for while using this medicine? Your condition will be monitored carefully while you are receiving this medicine. You will need important blood work done while you are taking this medicine. This drug may make you feel generally unwell. This is not uncommon, as chemotherapy can affect healthy cells as well as cancer cells. Report any side effects. Continue your course of treatment even though you feel ill unless your doctor tells you to stop. In some cases, you may be given additional medicines to help with side effects. Follow all directions for their use. You may get drowsy or dizzy. Do not drive, use machinery, or do anything that needs mental alertness until you know how this medicine affects you. Do not stand or sit up quickly, especially if you are an older patient. This reduces the risk of dizzy or fainting spells. Call your health care professional for advice if you get a fever, chills, or sore throat, or other symptoms of a cold or flu. Do not treat yourself. This  medicine decreases your body's ability to fight infections. Try to avoid being around people who are sick. Avoid taking products that contain aspirin, acetaminophen, ibuprofen, naproxen, or ketoprofen unless instructed by your doctor. These medicines may hide a fever. This medicine may increase your risk to bruise or bleed. Call your doctor or health care professional if you notice any unusual bleeding. Be careful brushing and flossing your teeth or using a toothpick because you may get an infection or bleed more easily. If you have any dental work done, tell your dentist you are receiving this medicine. Do not become pregnant while taking this medicine or for 6 months after stopping it. Women should inform their health care professional if they wish to become pregnant or think they might be pregnant. Men should not father a child while taking this medicine and for 3 months after stopping it. There is potential for serious side effects to an unborn child. Talk to your health care professional for more information. Do not breast-feed an infant while taking this medicine or for 7 days after stopping it. This medicine has caused ovarian failure in some women. This medicine may make it more difficult to get pregnant. Talk to your health care professional if you are  concerned about your fertility. This medicine has caused decreased sperm counts in some men. This may make it more difficult to father a child. Talk to your health care professional if you are concerned about your fertility. What side effects may I notice from receiving this medicine? Side effects that you should report to your doctor or health care professional as soon as possible: allergic reactions like skin rash, itching or hives, swelling of the face, lips, or tongue chest pain diarrhea flushing, runny nose, sweating during infusion low blood counts - this medicine may decrease the number of white blood cells, red blood cells and platelets.  You may be at increased risk for infections and bleeding. nausea, vomiting pain, swelling, warmth in the leg signs of decreased platelets or bleeding - bruising, pinpoint red spots on the skin, black, tarry stools, blood in the urine signs of infection - fever or chills, cough, sore throat, pain or difficulty passing urine signs of decreased red blood cells - unusually weak or tired, fainting spells, lightheadedness Side effects that usually do not require medical attention (report to your doctor or health care professional if they continue or are bothersome): constipation hair loss headache loss of appetite mouth sores stomach pain This list may not describe all possible side effects. Call your doctor for medical advice about side effects. You may report side effects to FDA at 1-800-FDA-1088. Where should I keep my medicine? This drug is given in a hospital or clinic and will not be stored at home. NOTE: This sheet is a summary. It may not cover all possible information. If you have questions about this medicine, talk to your doctor, pharmacist, or health care provider.  2021 Elsevier/Gold Standard (2018-12-23 17:46:13)

## 2020-07-22 ENCOUNTER — Telehealth (HOSPITAL_BASED_OUTPATIENT_CLINIC_OR_DEPARTMENT_OTHER): Payer: Self-pay

## 2020-07-22 MED ORDER — BLOOD GLUCOSE METER KIT: PACK | 1 refills | Status: AC

## 2020-07-22 NOTE — Telephone Encounter (Signed)
Received a voicemail form Manuela Schwartz stating that the patient discussed with her that she needs a glucometer and test strips and also that the patient needs to reschedule cancelled appointments I contacted patient to try to arrange follow up and she told me she would have to call back once she checks her work schedule.  I also discussed with patient that I would send glucometer and test strips RX to [facility] on file Patient is agreeable.

## 2020-07-23 ENCOUNTER — Inpatient Hospital Stay: Payer: BC Managed Care – PPO

## 2020-07-23 ENCOUNTER — Other Ambulatory Visit: Payer: Self-pay

## 2020-07-23 VITALS — BP 123/83 | HR 83 | Temp 97.9°F | Resp 18

## 2020-07-23 DIAGNOSIS — Z5112 Encounter for antineoplastic immunotherapy: Secondary | ICD-10-CM | POA: Diagnosis not present

## 2020-07-23 DIAGNOSIS — C7961 Secondary malignant neoplasm of right ovary: Secondary | ICD-10-CM | POA: Diagnosis not present

## 2020-07-23 DIAGNOSIS — Z452 Encounter for adjustment and management of vascular access device: Secondary | ICD-10-CM | POA: Diagnosis not present

## 2020-07-23 DIAGNOSIS — D6959 Other secondary thrombocytopenia: Secondary | ICD-10-CM | POA: Diagnosis not present

## 2020-07-23 DIAGNOSIS — C182 Malignant neoplasm of ascending colon: Secondary | ICD-10-CM | POA: Diagnosis not present

## 2020-07-23 DIAGNOSIS — Z5189 Encounter for other specified aftercare: Secondary | ICD-10-CM | POA: Diagnosis not present

## 2020-07-23 DIAGNOSIS — D509 Iron deficiency anemia, unspecified: Secondary | ICD-10-CM | POA: Diagnosis not present

## 2020-07-23 DIAGNOSIS — Z5111 Encounter for antineoplastic chemotherapy: Secondary | ICD-10-CM | POA: Diagnosis not present

## 2020-07-23 MED ORDER — PEGFILGRASTIM-CBQV 6 MG/0.6ML ~~LOC~~ SOSY
6.0000 mg | PREFILLED_SYRINGE | Freq: Once | SUBCUTANEOUS | Status: AC
  Administered 2020-07-23: 6 mg via SUBCUTANEOUS

## 2020-07-26 ENCOUNTER — Inpatient Hospital Stay: Payer: BC Managed Care – PPO

## 2020-07-26 ENCOUNTER — Other Ambulatory Visit: Payer: Self-pay

## 2020-07-26 ENCOUNTER — Encounter: Payer: Self-pay | Admitting: Nurse Practitioner

## 2020-07-26 ENCOUNTER — Inpatient Hospital Stay (HOSPITAL_BASED_OUTPATIENT_CLINIC_OR_DEPARTMENT_OTHER): Payer: BC Managed Care – PPO | Admitting: Nurse Practitioner

## 2020-07-26 VITALS — BP 135/86 | HR 97 | Temp 97.8°F | Resp 18 | Ht 66.0 in | Wt 157.2 lb

## 2020-07-26 DIAGNOSIS — C182 Malignant neoplasm of ascending colon: Secondary | ICD-10-CM | POA: Diagnosis not present

## 2020-07-26 DIAGNOSIS — Z5111 Encounter for antineoplastic chemotherapy: Secondary | ICD-10-CM | POA: Diagnosis not present

## 2020-07-26 DIAGNOSIS — D6959 Other secondary thrombocytopenia: Secondary | ICD-10-CM | POA: Diagnosis not present

## 2020-07-26 DIAGNOSIS — C7961 Secondary malignant neoplasm of right ovary: Secondary | ICD-10-CM | POA: Diagnosis not present

## 2020-07-26 DIAGNOSIS — Z5112 Encounter for antineoplastic immunotherapy: Secondary | ICD-10-CM | POA: Diagnosis not present

## 2020-07-26 DIAGNOSIS — D509 Iron deficiency anemia, unspecified: Secondary | ICD-10-CM | POA: Diagnosis not present

## 2020-07-26 DIAGNOSIS — Z452 Encounter for adjustment and management of vascular access device: Secondary | ICD-10-CM | POA: Diagnosis not present

## 2020-07-26 DIAGNOSIS — Z5189 Encounter for other specified aftercare: Secondary | ICD-10-CM | POA: Diagnosis not present

## 2020-07-26 LAB — CBC WITH DIFFERENTIAL (CANCER CENTER ONLY)
Abs Immature Granulocytes: 0.44 10*3/uL — ABNORMAL HIGH (ref 0.00–0.07)
Basophils Absolute: 0.1 10*3/uL (ref 0.0–0.1)
Basophils Relative: 0 %
Eosinophils Absolute: 0.1 10*3/uL (ref 0.0–0.5)
Eosinophils Relative: 1 %
HCT: 36.6 % (ref 36.0–46.0)
Hemoglobin: 11.4 g/dL — ABNORMAL LOW (ref 12.0–15.0)
Immature Granulocytes: 2 %
Lymphocytes Relative: 6 %
Lymphs Abs: 1.2 10*3/uL (ref 0.7–4.0)
MCH: 24.4 pg — ABNORMAL LOW (ref 26.0–34.0)
MCHC: 31.1 g/dL (ref 30.0–36.0)
MCV: 78.2 fL — ABNORMAL LOW (ref 80.0–100.0)
Monocytes Absolute: 0.5 10*3/uL (ref 0.1–1.0)
Monocytes Relative: 3 %
Neutro Abs: 16.6 10*3/uL — ABNORMAL HIGH (ref 1.7–7.7)
Neutrophils Relative %: 88 %
Platelet Count: 223 10*3/uL (ref 150–400)
RBC: 4.68 MIL/uL (ref 3.87–5.11)
RDW: 15.9 % — ABNORMAL HIGH (ref 11.5–15.5)
WBC Count: 18.9 10*3/uL — ABNORMAL HIGH (ref 4.0–10.5)
nRBC: 0 % (ref 0.0–0.2)

## 2020-07-26 LAB — CMP (CANCER CENTER ONLY)
ALT: 60 U/L — ABNORMAL HIGH (ref 0–44)
AST: 34 U/L (ref 15–41)
Albumin: 4.3 g/dL (ref 3.5–5.0)
Alkaline Phosphatase: 182 U/L — ABNORMAL HIGH (ref 38–126)
Anion gap: 10 (ref 5–15)
BUN: 11 mg/dL (ref 6–20)
CO2: 27 mmol/L (ref 22–32)
Calcium: 10 mg/dL (ref 8.9–10.3)
Chloride: 99 mmol/L (ref 98–111)
Creatinine: 0.72 mg/dL (ref 0.44–1.00)
GFR, Estimated: >60 mL/min (ref >60)
Glucose, Bld: 252 mg/dL — ABNORMAL HIGH (ref 70–99)
Potassium: 3.8 mmol/L (ref 3.5–5.1)
Sodium: 136 mmol/L (ref 135–145)
Total Bilirubin: 0.6 mg/dL (ref 0.3–1.2)
Total Protein: 7.2 g/dL (ref 6.5–8.1)

## 2020-07-26 MED ORDER — HEPARIN SOD (PORK) LOCK FLUSH 100 UNIT/ML IV SOLN
500.0000 [IU] | Freq: Once | INTRAVENOUS | Status: AC
  Administered 2020-07-26: 500 [IU] via INTRAVENOUS
  Filled 2020-07-26: qty 5

## 2020-07-26 MED ORDER — SODIUM CHLORIDE 0.9 % IV SOLN
Freq: Once | INTRAVENOUS | Status: AC
  Filled 2020-07-26: qty 250

## 2020-07-26 NOTE — Patient Instructions (Signed)
Diarrhea, Adult Diarrhea is frequent loose and watery bowel movements. Diarrhea can make you feel weak and cause you to become dehydrated. Dehydration can make you tiredand thirsty, cause you to have a dry mouth, and decrease how often you urinate. Diarrhea typically lasts 2-3 days. However, it can last longer if it is a sign of something more serious. It is important to treat your diarrhea as told byyour health care provider. Follow these instructions at home: Eating and drinking     Follow these recommendations as told by your health care provider: Take an oral rehydration solution (ORS). This is an over-the-counter medicine that helps return your body to its normal balance of nutrients and water. It is found at pharmacies and retail stores. Drink plenty of fluids, such as water, ice chips, diluted fruit juice, and low-calorie sports drinks. You can drink milk also, if desired. Avoid drinking fluids that contain a lot of sugar or caffeine, such as energy drinks, sports drinks, and soda. Eat bland, easy-to-digest foods in small amounts as you are able. These foods include bananas, applesauce, rice, lean meats, toast, and crackers. Avoid alcohol. Avoid spicy or fatty foods.  Medicines Take over-the-counter and prescription medicines only as told by your health care provider. If you were prescribed an antibiotic medicine, take it as told by your health care provider. Do not stop using the antibiotic even if you start to feel better. General instructions  Wash your hands often using soap and water. If soap and water are not available, use a hand sanitizer. Others in the household should wash their hands as well. Hands should be washed: After using the toilet or changing a diaper. Before preparing, cooking, or serving food. While caring for a sick person or while visiting someone in a hospital. Drink enough fluid to keep your urine pale yellow. Rest at home while you recover. Watch your  condition for any changes. Take a warm bath to relieve any burning or pain from frequent diarrhea episodes. Keep all follow-up visits as told by your health care provider. This is important.  Contact a health care provider if: You have a fever. Your diarrhea gets worse. You have new symptoms. You cannot keep fluids down. You feel light-headed or dizzy. You have a headache. You have muscle cramps. Get help right away if: You have chest pain. You feel extremely weak or you faint. You have bloody or black stools or stools that look like tar. You have severe pain, cramping, or bloating in your abdomen. You have trouble breathing or you are breathing very quickly. Your heart is beating very quickly. Your skin feels cold and clammy. You feel confused. You have signs of dehydration, such as: Dark urine, very little urine, or no urine. Cracked lips. Dry mouth. Sunken eyes. Sleepiness. Weakness. Summary Diarrhea is frequent loose and watery bowel movements. Diarrhea can make you feel weak and cause you to become dehydrated. Drink enough fluids to keep your urine pale yellow. Make sure that you wash your hands after using the toilet. If soap and water are not available, use hand sanitizer. Contact a health care provider if your diarrhea gets worse or you have new symptoms. Get help right away if you have signs of dehydration. This information is not intended to replace advice given to you by your health care provider. Make sure you discuss any questions you have with your healthcare provider.  Rehydration, Adult Rehydration is the replacement of body fluids, salts, and minerals (electrolytes) that are lost during  dehydration. Dehydration is when there is not enough water or other fluids in the body. This happens when you lose more fluids than you take in. Common causes of dehydration include: Not drinking enough fluids. This can occur when you are ill or doing activities that require a  lot of energy, especially in hot weather. Conditions that cause loss of water or other fluids, such as diarrhea, vomiting, sweating, or urinating a lot. Other illnesses, such as fever or infection. Certain medicines, such as those that remove excess fluid from the body (diuretics). Symptoms of mild or moderate dehydration may include thirst, dry lips and mouth, and dizziness. Symptoms of severe dehydration may include increasedheart rate, confusion, fainting, and not urinating. For severe dehydration, you may need to get fluids through an IV at the hospital. For mild or moderate dehydration, you can usually rehydrate at homeby drinking certain fluids as told by your health care provider. What are the risks? Generally, rehydration is safe. However, taking in too much fluid (overhydration) can be a problem. This is rare. Overhydration can cause an electrolyte imbalance, kidney failure, or a decrease in salt (sodium) levels in the body. Supplies needed You will need an oral rehydration solution (ORS) if your health care provider tells you to use one. This is a drink to treat dehydration. It can be found inpharmacies and retail stores. How to rehydrate Fluids Follow instructions from your health care provider for rehydration. The kind of fluid and the amount you should drink depend on your condition. In general, you should choose drinks that you prefer. If told by your health care provider, drink an ORS. Make an ORS by following instructions on the package. Start by drinking small amounts, about  cup (120 mL) every 5-10 minutes. Slowly increase how much you drink until you have taken the amount recommended by your health care provider. Drink enough clear fluids to keep your urine pale yellow. If you were told to drink an ORS, finish it first, then start slowly drinking other clear fluids. Drink fluids such as: Water. This includes sparkling water and flavored water. Drinking only water can lead to  having too little sodium in your body (hyponatremia). Follow the advice of your health care provider. Water from ice chips you suck on. Fruit juice with water you add to it (diluted). Sports drinks. Hot or cold herbal teas. Broth-based soups. Milk or milk products. Food Follow instructions from your health care provider about what to eat while you rehydrate. Your health care provider may recommend that you slowly begin eating regular foods in small amounts. Eat foods that contain a healthy balance of electrolytes, such as bananas, oranges, potatoes, tomatoes, and spinach. Avoid foods that are greasy or contain a lot of sugar. In some cases, you may get nutrition through a feeding tube that is passed through your nose and into your stomach (nasogastric tube, or NG tube). This may be done if you have uncontrolled vomiting or diarrhea. Beverages to avoid  Certain beverages may make dehydration worse. While you rehydrate, avoiddrinking alcohol. How to tell if you are recovering from dehydration You may be recovering from dehydration if: You are urinating more often than before you started rehydrating. Your urine is pale yellow. Your energy level improves. You vomit less frequently. You have diarrhea less frequently. Your appetite improves or returns to normal. You feel less dizzy or less light-headed. Your skin tone and color start to look more normal. Follow these instructions at home: Take over-the-counter and prescription medicines  only as told by your health care provider. Do not take sodium tablets. Doing this can lead to having too much sodium in your body (hypernatremia). Contact a health care provider if: You continue to have symptoms of mild or moderate dehydration, such as: Thirst. Dry lips. Slightly dry mouth. Dizziness. Dark urine or less urine than normal. Muscle cramps. You continue to vomit or have diarrhea. Get help right away if you: Have symptoms of dehydration  that get worse. Have a fever. Have a severe headache. Have been vomiting and the following happens: Your vomiting gets worse or does not go away. Your vomit includes blood or green matter (bile). You cannot eat or drink without vomiting. Have problems with urination or bowel movements, such as: Diarrhea that gets worse or does not go away. Blood in your stool (feces). This may cause stool to look black and tarry. Not urinating, or urinating only a small amount of very dark urine, within 6-8 hours. Have trouble breathing. Have symptoms that get worse with treatment. These symptoms may represent a serious problem that is an emergency. Do not wait to see if the symptoms will go away. Get medical help right away. Call your local emergency services (911 in the [place] Do not drive yourself to the hospital. Summary Rehydration is the replacement of body fluids and minerals (electrolytes) that are lost during dehydration. Follow instructions from your health care provider for rehydration. The kind of fluid and amount you should drink depend on your condition. Slowly increase how much you drink until you have taken the amount recommended by your health care provider. Contact your health care provider if you continue to show signs of mild or moderate dehydration. This information is not intended to replace advice given to you by your health care provider. Make sure you discuss any questions you have with your healthcare provider. Document Revised: 03/25/2019 Document Reviewed: 02/02/2019 Elsevier Patient Education  2022 [employer].

## 2020-07-26 NOTE — Progress Notes (Signed)
[place] OFFICE PROGRESS NOTE   Diagnosis: Colon cancer  INTERVAL HISTORY:   [name] returns prior to scheduled follow-up for evaluation of diarrhea.  She completed a cycle of irinotecan/Panitumumab 07/21/2020.  She started having diarrhea yesterday, frequent small amounts of liquidy stool.  The stool is not watery.  She took 2 doses of Lomotil last night and another dose this morning.  She has also taken some Imodium.  She had an episode of nausea yesterday, none today.  She is tolerating liquids.  No fever.  She notes abdominal pain prior to a bowel movement.  Objective:  Vital signs in last 24 hours:  Blood pressure 135/86, pulse 97, temperature 97.8 F (36.6 C), temperature source Oral, resp. rate 18, height 5' 6"  (1.676 m), weight 157 lb 3.2 oz (71.3 kg), SpO2 97 %.    HEENT: Posterior palate with patchy erythema. Resp: Lungs clear bilaterally. Cardio: Regular rate and rhythm. GI: Abdomen soft and nontender.  No hepatomegaly.  Bowel sounds normoactive.  No guarding or rebound. Vascular: No leg edema. Port-A-Cath without erythema.  Lab Results:  Lab Results  Component Value Date   WBC 18.9 (H) 07/26/2020   HGB 11.4 (L) 07/26/2020   HCT 36.6 07/26/2020   MCV 78.2 (L) 07/26/2020   PLT 223 07/26/2020   NEUTROABS PENDING 07/26/2020    Imaging:  No results found.  Medications: I have reviewed the patient's current medications.  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 The tumor returned microsatellite stable with no loss of mismatch repair protein expression   APC mutated. No BRAF, KRAS, or NRAS mutation On [facility] testing   Cycle 1 adjuvant FOLFOX 09/08/2013   Cycle 2 adjuvant FOLFOX 09/24/2013   Cycle 3 adjuvant FOLFOX 10/08/2013.   Cycle 4 adjuvant FOLFOX 10/22/2013.   Cycle 5 adjuvant FOLFOX 11/05/2013. Oxaliplatin held  due to thrombocytopenia. Cycle 6 FOLFOX 11/19/2013. Cycle 7 FOLFOX 12/03/2013. Oxaliplatin held secondary to thrombocytopenia. Cycle 8 FOLFOX 12/17/2013. Cycle 9 FOLFOX 01/04/2014. Oxaliplatin held secondary to neutropenia.   Cycle 10 FOLFOX 01/21/2014. Oxaliplatin held secondary to thrombocytopenia. Cycle 11 FOLFOX 02/04/2014 Cycle 12 FOLFOX 02/18/2014, oxaliplatin dose reduced secondary tothrombocytopenia CT abdomen/pelvis 01/30/2014 revealed splenomegaly and no evidence of recurrent colon cancer CT chest 04/07/2014 with a stable right lower lobe nodule and no evidence for metastatic disease, no nodules seen on the CT 11/26/2014 Markedly elevated CEA 11/24/2014 CT 11/26/2014 revealed a right pelvic mass, splenomegaly, small volume ascites Right salpingo-oophorectomy 12/28/2014 with the pathology confirming metastatic colon cancer CTs 03/23/2016-no evidence of recurrent or metastatic disease. CT 11/26/2016-enlargement of a fluid density structure the right pelvic sidewall, no other evidence of metastatic disease CT aspiration right pelvic cyst 12/19/2016.  Cytology-BENIGN REACTIVE/REPARATIVE CHANGES. CTs 06/05/2017- no evidence of metastatic disease, mild cirrhotic changes with splenomegaly CTs 12/11/2017- recurrent cystic right adnexal mass, similar to on the CT 11/26/2016, stable mild splenomegaly CTs 04/23/2018- enlargement of cystic right adnexal mass with mural nodularity, no other evidence of metastatic disease Cytoreductive surgery/HIPEC with mitomycin by Dr. Clovis Riley at [facility] 08/12/2018-R1 resection achieved.  Cytoreduction included omentectomy, LAR, right salpingo-oophorectomy and left colonic gutter/pelvic stripping.  Pathology on the rectum showed recurrent/metastatic adenocarcinoma, tumor 2.0 cm, predominantly involving the subserosa and muscularis propria of the colon, proximal and distal margins of resection were negative, vascular invasion present, metastatic carcinoma present in 1 out  of 5 lymph nodes; omentum resection with no malignancy seen, no metastatic carcinoma  identified in 1 lymph node examined; left gutter stripping positive metastatic adenocarcinoma; right ovary resection positive metastatic adenocarcinoma. 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 CEA 68 02/03/2019 Biopsy liver lesion 02/11/2019-metastatic adenocarcinoma consistent with primary colonic adenocarcinoma CTs 02/27/2019-multiple liver lesions increased in size, new lesion in the lateral right lobe of the liver.  No evidence of metastatic disease in the chest.  Redemonstrated moderate left hydronephrosis and proximal hydroureter without discrete lesion or obstructing etiology at the transition point of the mid ureter. Cycle 1 FOLFIRI/Panitumumab 03/12/2019 Cycle 2 FOLFIRI/Panitumumab 03/26/2019-bolus 5-FU and irinotecan held secondary to neutropenia Cycle 3 FOLFIRI/Panitumumab 04/09/2019, Udenyca added-not given secondary to seizure/discontinuation of the 5-FU pump CT abdomen/pelvis at [facility] 05/04/2019-no residual fluid collection at the left abdominal wall abscess, stable moderate left hydronephrosis, multifocal indeterminate liver lesions--liver lesions significantly improved Cycle 4 FOLFIRI/Panitumumab 05/07/2019, Udenyca Cycle 5 FOLFIRI/Panitumumab 05/20/2019, Udenyca Cycle 6 FOLFIRI/Panitumumab 06/18/2019, Udenyca Cycle 7 FOLFIRI/Panitumumab 07/01/2019 (Irinotecan dose reduced due to thrombocytopenia), Udenyca--Udenyca was not given Cycle 8 FOLFIRI/Panitumumab 07/15/2019, Udenyca Cycle 9 FOLFIRI/Panitumumab 07/29/2019, Udenyca Cycle 10 FOLFIRI/Panitumumab 08/13/2019, Udenyca CTs at [place] 08/19/2019-decreased size of the hypodense and hyperdense lesions within segment 2 of the left hepatic lobe.  No new lesions identified.  Similar presacral soft tissue thickening with adjacent stable alignment in the rectum.  Improved but persistent left  UPJ obstruction with mild left hydronephrosis.  Persistent but decreased size of the wound within the left mid abdomen abdominal wall. Cycle 11 irinotecan/Panitumumab 08/27/2019, Udenyca Cycle 12 irinotecan/Panitumumab 10/08/2019, Udenyca Cycle 13 irinotecan/Panitumumab 11/05/2019, Udenyca CT abdomen/pelvis 11/19/2019-no new or progressive interval findings.  Stable appearance of the calcified lesion in the anterior left hepatic dome with second tiny low-density lesion in the dome of the lateral segment left liver.  Both lesions are markedly decreased since 02/27/2019.  Stable mild fullness left intrarenal collecting system and renal pelvis.  Similar appearance of abnormal soft tissue in the left paramidline subcutaneous fat with associated skin thickening, this likely correlates to the fistula. Cycle 14 irinotecan/Panitumumab 11/19/2019, Udenyca Cycle 15 irinotecan/Panitumumab 12/02/2019, Udenyca CT abdomen/pelvis at [facility] 02/11/2020- similar to slight decrease in segment 2 hypodense and hyperdense lesions (compared to a CT from July 2020), no new lesions, splenomegaly, no fluid collection or abscess, mild stranding adjacent to the incision with overlying skin thickening Cycle 16 irinotecan/Panitumumab 03/03/2020, Udenyca Cycle 17 irinotecan/Panitumumab 03/17/2020, Udenyca Cycle 18 irinotecan/panitumumab 04/07/2020, Udenyca Cycle 19 irinotecan/Panitumumab 04/28/2020, Udenyca Cycle 20 irinotecan/panitumumab 05/26/2020, Udenyca Cycle 21 irinotecan/panitumumab 06/30/2020, Udenyca CT abdomen/pelvis 07/19/2020-mildly increased hepatic steatosis.  Increased size of 2.8 cm hypervascular lesion segment 2 left hepatic lobe. Cycle 22 irinotecan/Panitumumab 07/21/2020, Udenyca Mild elevation of the CEA beginning January 2016 , normal on 05/19/2014   History of iron deficiency anemia   seizure disorder; seizure 04/09/2019.  Brain CT negative.  Now on Keppra. history of depression   4 mm right lower lobe nodule on a  staging chest CT 09/08/2013 , stable on a CT 04/07/2014  Hospitalization 09/24/2013 through 09/26/2013 with fever and abdominal pain.   09/24/2013 urine culture positive for coag negative staph.   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  Scope advanced for approximately 25 cm.  Most proximal portion had necrotic appearing debris.  Colostomy bag insufflated suggesting some type of communication between the pouch and the proximal  colon.  Introduction of scope into the ostomy found to available directions.  1 toward the distal pouch with similar appearing necrotic debris encountered.  The other was about a 30 cm segment of normal-appearing colonic mucosa to the level of the previous right hemicolectomy ileocolonic anastomosis. 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  No malignancy.   16. COVID-19 + 09/10/2019 17. Hospitalized with seizure 09/17/2019 through 09/19/2019-MRI brain without evidence of metastatic disease. Seen by neurology. Keppra dose increased. 19.  Rash secondary to  Panitumumab  Disposition: [name] appears stable.  She completed another cycle of irinotecan/Panitumumab 07/21/2020.  She is now experiencing diarrhea.  The diarrhea is likely treatment related.  We provided her with Imodium dosing instructions for diarrhea specifically related to irinotecan.  She will also continue Lomotil 2 tablets 4 times daily as needed.  She will receive IV fluids in the office today and continue to push fluids by mouth at home.  She is submitting a stool sample for C. difficile testing.  We reviewed the CBC from today.  The white count is elevated most likely related to white cell growth factor support.  She will contact the office tomorrow with persistent severe diarrhea.    Ned Card ANP/GNP-BC   07/26/2020  1:54 PM

## 2020-07-27 ENCOUNTER — Telehealth: Payer: Self-pay

## 2020-07-27 LAB — C DIFFICILE QUICK SCREEN W PCR REFLEX
C Diff antigen: NEGATIVE
C Diff interpretation: NOT DETECTED
C Diff toxin: NEGATIVE

## 2020-07-27 NOTE — Telephone Encounter (Signed)
-----   Message from Owens Shark, NP sent at 07/27/2020 12:32 PM EDT ----- Please let her know the C. difficile test was negative.  Is she feeling better?  Is diarrhea better controlled?

## 2020-07-27 NOTE — Telephone Encounter (Signed)
Made pt aware of c-diff results pt appreciative of update states she feels better nothing further to report encouraged to call for any changes concerns

## 2020-07-28 ENCOUNTER — Ambulatory Visit (HOSPITAL_COMMUNITY)
Admission: RE | Admit: 2020-07-28 | Discharge: 2020-07-28 | Disposition: A | Discharge status: Home / Self Care | Payer: BC Managed Care – PPO | Source: Ambulatory Visit | Attending: Nurse Practitioner | Admitting: Nurse Practitioner

## 2020-07-28 DIAGNOSIS — C182 Malignant neoplasm of ascending colon: Secondary | ICD-10-CM | POA: Insufficient documentation

## 2020-07-28 DIAGNOSIS — C189 Malignant neoplasm of colon, unspecified: Secondary | ICD-10-CM | POA: Diagnosis not present

## 2020-07-28 DIAGNOSIS — C787 Secondary malignant neoplasm of liver and intrahepatic bile duct: Secondary | ICD-10-CM | POA: Diagnosis not present

## 2020-07-28 DIAGNOSIS — K760 Fatty (change of) liver, not elsewhere classified: Secondary | ICD-10-CM | POA: Diagnosis not present

## 2020-07-28 DIAGNOSIS — R161 Splenomegaly, not elsewhere classified: Secondary | ICD-10-CM | POA: Diagnosis not present

## 2020-07-28 DIAGNOSIS — R160 Hepatomegaly, not elsewhere classified: Secondary | ICD-10-CM | POA: Diagnosis not present

## 2020-07-28 MED ORDER — GADOBUTROL 1 MMOL/ML IV SOLN
7.0000 mL | Freq: Once | INTRAVENOUS | Status: AC | PRN
  Administered 2020-07-28: 7 mL via INTRAVENOUS

## 2020-07-29 MED ORDER — BUPIVACAINE-EPINEPHRINE (PF) 0.25% -1:200000 IJ SOLN
INTRAMUSCULAR | Status: AC
  Filled 2020-07-29: qty 30

## 2020-07-30 ENCOUNTER — Other Ambulatory Visit: Payer: Self-pay | Admitting: Oncology

## 2020-08-04 ENCOUNTER — Encounter: Payer: Self-pay | Admitting: Nurse Practitioner

## 2020-08-04 ENCOUNTER — Inpatient Hospital Stay: Payer: BC Managed Care – PPO

## 2020-08-04 ENCOUNTER — Inpatient Hospital Stay (HOSPITAL_BASED_OUTPATIENT_CLINIC_OR_DEPARTMENT_OTHER): Payer: BC Managed Care – PPO | Admitting: Nurse Practitioner

## 2020-08-04 ENCOUNTER — Other Ambulatory Visit: Payer: Self-pay

## 2020-08-04 VITALS — BP 144/86 | HR 86 | Temp 97.8°F | Resp 18 | Ht 66.0 in | Wt 161.0 lb

## 2020-08-04 DIAGNOSIS — D6959 Other secondary thrombocytopenia: Secondary | ICD-10-CM | POA: Diagnosis not present

## 2020-08-04 DIAGNOSIS — C182 Malignant neoplasm of ascending colon: Secondary | ICD-10-CM | POA: Diagnosis not present

## 2020-08-04 DIAGNOSIS — Z5111 Encounter for antineoplastic chemotherapy: Secondary | ICD-10-CM | POA: Diagnosis not present

## 2020-08-04 DIAGNOSIS — Z5189 Encounter for other specified aftercare: Secondary | ICD-10-CM | POA: Diagnosis not present

## 2020-08-04 DIAGNOSIS — Z452 Encounter for adjustment and management of vascular access device: Secondary | ICD-10-CM | POA: Diagnosis not present

## 2020-08-04 DIAGNOSIS — D509 Iron deficiency anemia, unspecified: Secondary | ICD-10-CM | POA: Diagnosis not present

## 2020-08-04 DIAGNOSIS — C7961 Secondary malignant neoplasm of right ovary: Secondary | ICD-10-CM | POA: Diagnosis not present

## 2020-08-04 DIAGNOSIS — C787 Secondary malignant neoplasm of liver and intrahepatic bile duct: Secondary | ICD-10-CM

## 2020-08-04 DIAGNOSIS — Z5112 Encounter for antineoplastic immunotherapy: Secondary | ICD-10-CM | POA: Diagnosis not present

## 2020-08-04 LAB — CBC WITH DIFFERENTIAL (CANCER CENTER ONLY)
Abs Immature Granulocytes: 0.09 10*3/uL — ABNORMAL HIGH (ref 0.00–0.07)
Basophils Absolute: 0 10*3/uL (ref 0.0–0.1)
Basophils Relative: 0 %
Eosinophils Absolute: 0.1 10*3/uL (ref 0.0–0.5)
Eosinophils Relative: 1 %
HCT: 37.5 % (ref 36.0–46.0)
Hemoglobin: 11.3 g/dL — ABNORMAL LOW (ref 12.0–15.0)
Immature Granulocytes: 1 %
Lymphocytes Relative: 14 %
Lymphs Abs: 1.1 10*3/uL (ref 0.7–4.0)
MCH: 23.8 pg — ABNORMAL LOW (ref 26.0–34.0)
MCHC: 30.1 g/dL (ref 30.0–36.0)
MCV: 78.9 fL — ABNORMAL LOW (ref 80.0–100.0)
Monocytes Absolute: 0.3 10*3/uL (ref 0.1–1.0)
Monocytes Relative: 4 %
Neutro Abs: 6.1 10*3/uL (ref 1.7–7.7)
Neutrophils Relative %: 80 %
Platelet Count: 143 10*3/uL — ABNORMAL LOW (ref 150–400)
RBC: 4.75 MIL/uL (ref 3.87–5.11)
RDW: 15.8 % — ABNORMAL HIGH (ref 11.5–15.5)
WBC Count: 7.8 10*3/uL (ref 4.0–10.5)
nRBC: 0 % (ref 0.0–0.2)

## 2020-08-04 LAB — CEA (ACCESS): CEA (CHCC): 18.6 ng/mL — ABNORMAL HIGH (ref 0.00–5.00)

## 2020-08-04 LAB — CMP (CANCER CENTER ONLY)
ALT: 30 U/L (ref 0–44)
AST: 23 U/L (ref 15–41)
Albumin: 3.9 g/dL (ref 3.5–5.0)
Alkaline Phosphatase: 128 U/L — ABNORMAL HIGH (ref 38–126)
Anion gap: 9 (ref 5–15)
BUN: 10 mg/dL (ref 6–20)
CO2: 25 mmol/L (ref 22–32)
Calcium: 9.2 mg/dL (ref 8.9–10.3)
Chloride: 104 mmol/L (ref 98–111)
Creatinine: 0.56 mg/dL (ref 0.44–1.00)
GFR, Estimated: >60 mL/min (ref >60)
Glucose, Bld: 227 mg/dL — ABNORMAL HIGH (ref 70–99)
Potassium: 3.8 mmol/L (ref 3.5–5.1)
Sodium: 138 mmol/L (ref 135–145)
Total Bilirubin: 0.4 mg/dL (ref 0.3–1.2)
Total Protein: 6.7 g/dL (ref 6.5–8.1)

## 2020-08-04 LAB — MAGNESIUM: Magnesium: 1.7 mg/dL (ref 1.7–2.4)

## 2020-08-04 MED ORDER — ATROPINE SULFATE 1 MG/ML IJ SOLN
0.5000 mg | Freq: Once | INTRAMUSCULAR | Status: AC | PRN
  Administered 2020-08-04: 0.5 mg via INTRAVENOUS
  Filled 2020-08-04: qty 1

## 2020-08-04 MED ORDER — HEPARIN SOD (PORK) LOCK FLUSH 100 UNIT/ML IV SOLN
500.0000 [IU] | Freq: Once | INTRAVENOUS | Status: AC | PRN
  Administered 2020-08-04: 500 [IU]
  Filled 2020-08-04: qty 5

## 2020-08-04 MED ORDER — DEXAMETHASONE SODIUM PHOSPHATE 10 MG/ML IJ SOLN
5.0000 mg | Freq: Once | INTRAMUSCULAR | Status: AC
  Administered 2020-08-04: 5 mg via INTRAVENOUS
  Filled 2020-08-04: qty 1

## 2020-08-04 MED ORDER — SODIUM CHLORIDE 0.9 % IV SOLN
6.0000 mg/kg | Freq: Once | INTRAVENOUS | Status: AC
  Administered 2020-08-04: 400 mg via INTRAVENOUS
  Filled 2020-08-04: qty 20

## 2020-08-04 MED ORDER — SODIUM CHLORIDE 0.9 % IV SOLN
140.0000 mg/m2 | Freq: Once | INTRAVENOUS | Status: AC
  Administered 2020-08-04: 260 mg via INTRAVENOUS
  Filled 2020-08-04: qty 13

## 2020-08-04 MED ORDER — PALONOSETRON HCL INJECTION 0.25 MG/5ML
0.2500 mg | Freq: Once | INTRAVENOUS | Status: AC
  Administered 2020-08-04: 0.25 mg via INTRAVENOUS
  Filled 2020-08-04: qty 5

## 2020-08-04 MED ORDER — SODIUM CHLORIDE 0.9% FLUSH
10.0000 mL | INTRAVENOUS | Status: DC | PRN
Start: 2020-08-04 — End: 2020-08-04
  Administered 2020-08-04: 10 mL
  Filled 2020-08-04: qty 10

## 2020-08-04 MED ORDER — SODIUM CHLORIDE 0.9 % IV SOLN
Freq: Once | INTRAVENOUS | Status: AC
Start: 2020-08-04 — End: 2020-08-04
  Filled 2020-08-04: qty 250

## 2020-08-04 NOTE — Progress Notes (Addendum)
[place]   [phone]) [phone] Fax:(336) [phone]   Clinic Follow up Note   Patient Care Team: [name], [name], MD as PCP - General (Family Medicine) [name], MD as Consulting Physician (Oncology) [name], DO as Consulting Physician (Neurology) 08/04/2020  CHIEF COMPLAINT: Follow-up metastatic colon cancer  CURRENT THERAPY: Irinotecan and panitumumab every 2 weeks  INTERVAL HISTORY: [name] returns for follow-up and treatment as scheduled.  She completed another cycle of irinotecan and panitumumab on 07/21/2020, and Udenyca on 6/18.  She is doing "okay," tired but remains active and continues working.  Appetite is very good.  Taking Lomotil and Imodium per instructions which is keeping diarrhea at [place], still worse with certain foods such as Janine Limbo.  She is up to 3 episodes of diarrhea per day, no nausea/vomiting.  She has bilateral styes, Tylenol and ibuprofen are not helping.  There is drainage.  Warm compresses 10 minutes on and 10 minutes off are partially helpful.  Denies fever or chills.  Denies recent cough, chest pain, dyspnea, progressive neuropathy, or other new concerns.   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   Restless leg syndrome    Seizures ([place])    last seizure 08/2012    SURGICAL HISTORY: Past Surgical History:  Procedure Laterality Date   ABDOMINAL HYSTERECTOMY  08/24/2004   partial   APPENDECTOMY  08/24/2004   COLON SURGERY  08/11/2013   removed a foot of colon   COLONOSCOPY  2019   colonoscopy 10-18-14     COLONOSCOPY  WITH PROPOFOL  07/07/2013   CYSTOSCOPY  11/01/2003   ENDOMETRIAL FULGURATION  11/01/2003   ESOPHAGOGASTRODUODENOSCOPY  07/07/2013   IR IMAGING GUIDED PORT INSERTION  03/05/2019   LAPAROSCOPIC LYSIS OF ADHESIONS  11/01/2003   LAPAROSCOPIC PARTIAL COLECTOMY Right 08/11/2013   Procedure: LAPAROSCOPIC RIGHT HEMICOLECTOMY;  Surgeon: Stark Klein, MD;  Location: [facility];  Service: General;  Laterality: Right;   LAPAROTOMY N/A 12/28/2014   Procedure: EXPLORATORY LAPAROTOMY;  Surgeon: Everitt Amber, MD;  Location: [facility];  Service: Gynecology;  Laterality: N/A;   LEFT OOPHORECTOMY  08/24/2004   PANNICULECTOMY  08/24/2004   PORT-A-CATH REMOVAL Left 06/08/2014   Procedure: REMOVAL PORT-A-CATH;  Surgeon: Stark Klein, MD;  Location: [facility];  Service: General;  Laterality: Left;   PORTACATH PLACEMENT Left 09/09/2013   Procedure: INSERTION PORT-A-CATH;  Surgeon: Stark Klein, MD;  Location: [facility];  Service: General;  Laterality: Left;   SALPINGOOPHORECTOMY Right 12/28/2014   Procedure: RIGHT SALPINGO OOPHORECTOMY;  Surgeon: Everitt Amber, MD;  Location: [facility];  Service: Gynecology;  Laterality: Right;   TUBAL LIGATION  11/07/2000   UNILATERAL SALPINGECTOMY Left 08/24/2004   WISDOM TOOTH EXTRACTION Bilateral 1994    I have reviewed the social history and family history with the patient and they are unchanged from previous note.  ALLERGIES:  is allergic to adhesive [tape], clindamycin/lincomycin, promethazine, eggs or egg-derived products, metoclopramide hcl, oxycodone-acetaminophen, sulfa antibiotics, levaquin [levofloxacin], and penicillins.  MEDICATIONS:  Current  Outpatient Medications  Medication Sig Dispense Refill   acetaminophen (TYLENOL) 500 MG tablet Take 1,000 mg by mouth every 6 (six) hours as needed.     blood glucose meter kit and supplies Dispense based on patient and insurance preference. Use up to four times daily as directed. (FOR ICD-10 E10.9, E11.9). 1 each 1   diphenhydrAMINE  (BENADRYL) 50 MG tablet Take 100 mg by mouth at bedtime as needed for itching or sleep.     diphenoxylate-atropine (LOMOTIL) 2.5-0.025 MG tablet TAKE 1- 2 TABLETS BY MOUTH 4 TIMES DAILY AS NEEDED FOR DIARRHEA /  LOOSE  STOOLS 60 tablet 1   doxycycline (VIBRA-TABS) 100 MG tablet Take 1 tablet by mouth twice daily 60 tablet 1   fluticasone (CUTIVATE) 0.05 % cream Apply topically 2 (two) times daily. To rash 30 g 1   hydrocortisone 2.5 % cream Apply topically 2 (two) times daily. To rash 30 g 2   lacosamide (VIMPAT) 200 MG TABS tablet Samples of this drug were given to the patient, quantity 3 boxes (14 tablets in each box), Lot Number#: [id number] exp:9/25 14 tablet 0   lidocaine-prilocaine (EMLA) cream Apply 1 application topically as directed. Apply to port site 1 hour prior to stick and cover with plastic wrap 30 g 1   loperamide (IMODIUM) 2 MG capsule Take 2 mg by mouth as needed for diarrhea or loose stools.     LORazepam (ATIVAN) 0.5 MG tablet Take 1 tablet (0.5 mg total) by mouth 2 (two) times daily as needed. for anxiety 60 tablet 0   magnesium oxide (MAG-OX) 400 (240 Mg) MG tablet Take 1 tablet by mouth twice daily 60 tablet 0   metFORMIN (GLUCOPHAGE) 500 MG tablet Take 1 tablet by mouth twice daily with food for 7 days.  Then increase to 2 tablets with food in the morning and 1 tablet with food in the evening for 7 days.  Then increase to 2 tablets by mouth twice daily with food. 180 tablet 3   omeprazole (PRILOSEC) 40 MG capsule Take 40 mg by mouth as needed.     oxyCODONE (OXY IR/ROXICODONE) 5 MG immediate release tablet Take 1 tablet (5 mg total) by mouth every 6 (six) hours as needed for severe pain. 30 tablet 0   prochlorperazine (COMPAZINE) 10 MG tablet Take 1 tablet (10 mg total) by mouth every 6 (six) hours as needed for nausea or vomiting. 60 tablet 1   No current facility-administered medications for this visit.   Facility-Administered Medications Ordered in Other Visits  Medication  Dose Route Frequency Provider Last Rate Last Admin   sodium chloride flush (NS) 0.9 % injection 10 mL  10 mL Intracatheter PRN [name], MD   10 mL at 08/04/20 1320    PHYSICAL EXAMINATION: ECOG PERFORMANCE STATUS: 1 - Symptomatic but completely ambulatory  Vitals:   08/04/20 0901  BP: (!) 144/86  Pulse: 86  Resp: 18  Temp: 97.8 F (36.6 C)  SpO2: 100%   Filed Weights   08/04/20 0901  Weight: 161 lb (73 kg)    GENERAL:alert, no distress and comfortable SKIN: No rash EYES:  sclera clear, upper and lower lid erythema bilaterally NECK: Without mass LYMPH:  no palpable cervical or supraclavicular lymphadenopathy  LUNGS: clear with normal breathing effort HEART: regular rate & rhythm, no lower extremity edema ABDOMEN:abdomen soft, non-tender and normal bowel sounds NEURO: alert & oriented x 3 with fluent speech, no focal motor/sensory deficits PAC without erythema  LABORATORY DATA:  I have reviewed the data as listed CBC Latest Ref Rng & Units 08/04/2020 07/26/2020 07/21/2020  WBC 4.0 - 10.5 K/uL 7.8 18.9(H) 4.3  Hemoglobin 12.0 - 15.0 g/dL 11.3(L) 11.4(L) 11.4(L)  Hematocrit 36.0 - 46.0 % 37.5 36.6 37.1  Platelets 150 - 400 K/uL 143(L) 223 172     CMP Latest Ref Rng & Units 08/04/2020 07/26/2020 07/21/2020  Glucose 70 - 99 mg/dL 227(H) 252(H) 212(H)  BUN 6 - 20 mg/dL 10 11 16   Creatinine 0.44 - 1.00 mg/dL 0.56 0.72 0.64  Sodium 135 - 145 mmol/L 138 136 138  Potassium 3.5 - 5.1 mmol/L 3.8 3.8 4.0  Chloride 98 - 111 mmol/L 104 99 103  CO2 22 - 32 mmol/L 25 27 27   Calcium 8.9 - 10.3 mg/dL 9.2 10.0 9.3  Total Protein 6.5 - 8.1 g/dL 6.7 7.2 7.0  Total Bilirubin 0.3 - 1.2 mg/dL 0.4 0.6 0.6  Alkaline Phos 38 - 126 U/L 128(H) 182(H) 115  AST 15 - 41 U/L 23 34 26  ALT 0 - 44 U/L 30 60(H) 40      RADIOGRAPHIC STUDIES: I have personally reviewed the radiological images as listed and agreed with the findings in the report. No results found.   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 The tumor returned microsatellite stable with no loss of mismatch repair protein expression   APC mutated. No BRAF, KRAS, or NRAS mutation On [facility] 1 testing   Cycle 1 adjuvant FOLFOX 09/08/2013   Cycle 2 adjuvant FOLFOX 09/24/2013   Cycle 3 adjuvant FOLFOX 10/08/2013.   Cycle 4 adjuvant FOLFOX 10/22/2013.   Cycle 5 adjuvant FOLFOX 11/05/2013. Oxaliplatin held due to thrombocytopenia. Cycle 6 FOLFOX 11/19/2013. Cycle 7 FOLFOX 12/03/2013. Oxaliplatin held secondary to thrombocytopenia. Cycle 8 FOLFOX 12/17/2013. Cycle 9 FOLFOX 01/04/2014. Oxaliplatin held secondary to neutropenia.   Cycle 10 FOLFOX 01/21/2014. Oxaliplatin held secondary to thrombocytopenia. Cycle 11 FOLFOX 02/04/2014 Cycle 12 FOLFOX 02/18/2014, oxaliplatin dose reduced secondary tothrombocytopenia CT abdomen/pelvis 01/30/2014 revealed splenomegaly and no evidence of recurrent colon cancer CT chest 04/07/2014 with a stable right lower lobe nodule and no evidence for metastatic disease, no nodules seen on the CT 11/26/2014 Markedly elevated CEA 11/24/2014 CT 11/26/2014 revealed a right pelvic mass, splenomegaly, small volume ascites Right salpingo-oophorectomy 12/28/2014 with the pathology confirming metastatic colon cancer CTs 03/23/2016-no evidence of recurrent or metastatic disease. CT 11/26/2016-enlargement of a fluid density structure the right pelvic sidewall, no other evidence of metastatic disease CT aspiration right pelvic cyst 12/19/2016.  Cytology-BENIGN REACTIVE/REPARATIVE CHANGES. CTs 06/05/2017- no evidence of metastatic disease, mild cirrhotic changes with splenomegaly CTs 12/11/2017- recurrent cystic right adnexal mass, similar to on the CT 11/26/2016, stable mild splenomegaly CTs 04/23/2018- enlargement of cystic right adnexal mass with mural nodularity, no other  evidence of metastatic disease Cytoreductive surgery/HIPEC with mitomycin by Dr. Clovis Riley at [facility] 08/12/2018-R1 resection achieved.  Cytoreduction included omentectomy, LAR, right salpingo-oophorectomy and left colonic gutter/pelvic stripping.  Pathology on the rectum showed recurrent/metastatic adenocarcinoma, tumor 2.0 cm, predominantly involving the subserosa and muscularis propria of the colon, proximal and distal margins of resection were negative, vascular invasion present, metastatic carcinoma present in 1 out of 5 lymph nodes; omentum resection with no malignancy seen, no metastatic carcinoma identified in 1 lymph node examined; left gutter stripping positive metastatic adenocarcinoma; right ovary resection positive metastatic adenocarcinoma. 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 CEA 68 02/03/2019 Biopsy liver lesion 02/11/2019-metastatic adenocarcinoma consistent with primary colonic adenocarcinoma CTs 02/27/2019-multiple liver lesions increased in size, new lesion in the lateral right lobe of the liver.  No evidence of metastatic disease in the chest.  Redemonstrated moderate left hydronephrosis and proximal hydroureter without discrete lesion or obstructing etiology at the transition point of the mid ureter. Cycle 1 FOLFIRI/Panitumumab 03/12/2019 Cycle 2 FOLFIRI/Panitumumab 03/26/2019-bolus 5-FU and irinotecan held secondary to neutropenia Cycle 3 FOLFIRI/Panitumumab 04/09/2019, Udenyca added-not given secondary to seizure/discontinuation of the 5-FU pump CT abdomen/pelvis at [facility] 05/04/2019-no residual fluid collection at the left abdominal wall abscess, stable moderate left hydronephrosis, multifocal indeterminate liver lesions--liver lesions significantly improved Cycle 4 FOLFIRI/Panitumumab 05/07/2019, Udenyca Cycle 5 FOLFIRI/Panitumumab 05/20/2019, Udenyca Cycle 6 FOLFIRI/Panitumumab 06/18/2019, Udenyca Cycle 7  FOLFIRI/Panitumumab 07/01/2019 (Irinotecan dose reduced due to thrombocytopenia), Udenyca--Udenyca was not given Cycle 8 FOLFIRI/Panitumumab 07/15/2019, Udenyca Cycle 9 FOLFIRI/Panitumumab 07/29/2019, Udenyca Cycle 10 FOLFIRI/Panitumumab 08/13/2019, Udenyca CTs at [facility] 08/19/2019-decreased size of the hypodense and hyperdense lesions within segment 2 of the left hepatic lobe.  No new lesions identified.  Similar presacral soft tissue thickening with adjacent stable alignment in the rectum.  Improved but persistent left UPJ obstruction with mild left hydronephrosis.  Persistent but decreased size of the wound within the left mid abdomen abdominal wall. Cycle 11 irinotecan/Panitumumab 08/27/2019, Udenyca Cycle 12 irinotecan/Panitumumab 10/08/2019, Udenyca Cycle 13 irinotecan/Panitumumab 11/05/2019, Udenyca CT abdomen/pelvis 11/19/2019-no new or progressive interval findings.  Stable appearance of the calcified lesion in the anterior left hepatic dome with second tiny low-density lesion in the dome of the lateral segment left liver.  Both lesions are markedly decreased since 02/27/2019.  Stable mild fullness left intrarenal collecting system and renal pelvis.  Similar appearance of abnormal soft tissue in the left paramidline subcutaneous fat with associated skin thickening, this likely correlates to the fistula. Cycle 14 irinotecan/Panitumumab 11/19/2019, Udenyca Cycle 15 irinotecan/Panitumumab 12/02/2019, Udenyca CT abdomen/pelvis at [facility] 02/11/2020- similar to slight decrease in segment 2 hypodense and hyperdense lesions (compared to a CT from July 2020), no new lesions, splenomegaly, no fluid collection or abscess, mild stranding adjacent to the incision with overlying skin thickening Cycle 16 irinotecan/Panitumumab 03/03/2020, Udenyca Cycle 17 irinotecan/Panitumumab 03/17/2020, Udenyca Cycle 18 irinotecan/panitumumab 04/07/2020, Udenyca Cycle 19 irinotecan/Panitumumab 04/28/2020, Udenyca Cycle 20  irinotecan/panitumumab 05/26/2020, Udenyca Cycle 21 irinotecan/panitumumab 06/30/2020, Udenyca CT abdomen/pelvis 07/19/2020-mildly increased hepatic steatosis.  Increased size of 2.8 cm hypervascular lesion segment 2 left hepatic lobe. Cycle 22 irinotecan/Panitumumab 07/21/2020, Udenyca MRI Abd 07/28/2020: liver lesions consistent with hepatic metastasis Cycle 23 irinotecan/Panitumumab 08/04/2020, Udenyca  Mild elevation of the CEA beginning January 2016 , normal on 05/19/2014   History of iron deficiency anemia   seizure disorder; seizure 04/09/2019.  Brain CT negative.  Now on Keppra. history of depression   4 mm right lower lobe nodule on a staging chest CT 09/08/2013 , stable on a CT 04/07/2014  Hospitalization 09/24/2013 through 09/26/2013 with fever and abdominal pain.   09/24/2013 urine culture positive for coag negative staph.   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  Scope advanced for approximately 25 cm.  Most proximal portion had necrotic appearing debris.  Colostomy bag insufflated suggesting some type of communication between the pouch and the proximal colon.  Introduction of scope into the ostomy found to available directions.  1 toward  the distal pouch with similar appearing necrotic debris encountered.  The other was about a 30 cm segment of normal-appearing colonic mucosa to the level of the previous right hemicolectomy ileocolonic anastomosis. Port-A-Cath placement interventional radiology 03/05/2019 Left leg/foot weakness-brain MRI 09/03/2019 with no evidence of metastatic disease, referred to physical therapy Possible abdominal wall abscess status post evaluation by surgery at [facility] 04/03/2019, antibiotics initiated; incision and drainage  with purulent material removed 04/22/2019 CT at [facility] 05/04/2019-no fluid collection at the site of the left abdominal wall abscess, "indeterminate" liver lesions CT at [facility] 06/04/2019-persistent open wound of the left abdomen, 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  No malignancy.   16. COVID-19 + 09/10/2019 17. Hospitalized with seizure 09/17/2019 through 09/19/2019-MRI brain without evidence of metastatic disease. Seen by neurology. Keppra dose increased. 19.  Rash secondary to Panitumumab   Disposition:  [name] appears stable.  She continues irinotecan and panitumumab.  Diarrhea has improved on Lomotil and Imodium.  She otherwise tolerates treatment, she is able to recover and function well.  There is no clinical evidence of disease progression.  Labs reviewed, CEA is improving. Adequate for treatment.   Patient seen with Dr. Benay Spice. We reviewed the abdominal MRI independently and with the patient, which shows at least 4 liver lesions. We plan to present her case at next GI tumor conference to see if she is a candidate for liver directed therapy, such as ablation. The plan is to continue the current regimen with irinotecan and panitumumab every 2 weeks.    Proceed with treatment today as planned, Udenyca on day 3, then lab and follow-up with next treatment in 2 weeks.  Orders Placed This Encounter  Procedures   CEA (Access)[facility] ONLY    Standing Status:   Future    Standing Expiration Date:   08/04/2021   CBC with Differential ([place] Only)    Standing Status:   Future    Standing Expiration Date:   08/04/2021   CMP ([place] only)    Standing Status:   Future    Standing Expiration Date:   08/04/2021    All questions were answered. The patient knows to call the clinic with any problems, questions or concerns. No  barriers to learning were detected.     Alla Feeling, NP 08/04/20  This was a shared visit with Cira Rue.  We reviewed the MRI results and images with [name].  The MRI confirms multiple liver metastases.  The CEA is lower.  The current MRI will serve as a baseline for repeat imaging.  There was no other evidence of metastatic disease at present.  The plan is to continue irinotecan/panitumumab every 2 weeks.  I will present her case at the GI tumor conference to see whether she may be a candidate for ablation or other hepatic directed therapies.  I was present for greater than 50% of today's visit.  I performed medical decision making.  Julieanne Manson, MD

## 2020-08-04 NOTE — Patient Instructions (Signed)
[facility] Discharge Instructions for Patients Receiving Chemotherapy  Today you received the following immunotherapy agent: Panitumumab (Vectibix) and chemotherapy agent: Irinotecan  To help prevent nausea and vomiting after your treatment, we encourage you to take your nausea medication as directed by your MD.   If you develop nausea and vomiting that is not controlled by your nausea medication, call the clinic.   BELOW ARE SYMPTOMS THAT SHOULD BE REPORTED IMMEDIATELY: *FEVER GREATER THAN 100.5 F *CHILLS WITH OR WITHOUT FEVER NAUSEA AND VOMITING THAT IS NOT CONTROLLED WITH YOUR NAUSEA MEDICATION *UNUSUAL SHORTNESS OF BREATH *UNUSUAL BRUISING OR BLEEDING TENDERNESS IN MOUTH AND THROAT WITH OR WITHOUT PRESENCE OF ULCERS *URINARY PROBLEMS *BOWEL PROBLEMS UNUSUAL RASH Items with * indicate a potential emergency and should be followed up as soon as possible.  Feel free to call the clinic should you have any questions or concerns. The clinic phone number is [phone].  Please show the [facility] at check-in to the Emergency Department and triage nurse.  Panitumumab Solution for Injection What is this medicine? PANITUMUMAB (pan i TOOM ue mab) is a monoclonal antibody. It is used to treat colorectal cancer. This medicine may be used for other purposes; ask your health care provider or pharmacist if you have questions. COMMON BRAND NAME(S): Vectibix What should I tell my health care provider before I take this medicine? They need to know if you have any of these conditions: eye disease, vision problems low levels of calcium, magnesium, or potassium in the blood lung or breathing disease, like asthma skin conditions or sensitivity an unusual or allergic reaction to panitumumab, other medicines, foods, dyes, or preservatives pregnant or trying to get pregnant breast-feeding How should I use this medicine? This drug is given as an infusion into a vein. It is  administered in a hospital or clinic by a specially trained health care professional. Talk to your pediatrician regarding the use of this medicine in children. Special care may be needed. Overdosage: If you think you have taken too much of this medicine contact a poison control center or emergency room at once. NOTE: This medicine is only for you. Do not share this medicine with others. What if I miss a dose? It is important not to miss your dose. Call your doctor or health care professional if you are unable to keep an appointment. What may interact with this medicine? Do not take this medicine with any of the following medications: bevacizumab This list may not describe all possible interactions. Give your health care provider a list of all the medicines, herbs, non-prescription drugs, or dietary supplements you use. Also tell them if you smoke, drink alcohol, or use illegal drugs. Some items may interact with your medicine. What should I watch for while using this medicine? Visit your doctor for checks on your progress. This drug may make you feel generally unwell. This is not uncommon, as chemotherapy can affect healthy cells as well as cancer cells. Report any side effects. Continue your course of treatment even though you feel ill unless your doctor tells you to stop. This medicine can make you more sensitive to the sun. Keep out of the sun while receiving this medicine and for 2 months after the last dose. If you cannot avoid being in the sun, wear protective clothing and use sunscreen. Do not use sun lamps or tanning beds/booths. In some cases, you may be given additional medicines to help with side effects. Follow all directions for their use.  Call your doctor or health care professional for advice if you get a fever, chills or sore throat, or other symptoms of a cold or flu. Do not treat yourself. This drug decreases your body's ability to fight infections. Try to avoid being around people  who are sick. Avoid taking products that contain aspirin, acetaminophen, ibuprofen, naproxen, or ketoprofen unless instructed by your doctor. These medicines may hide a fever. Do not become pregnant while taking this medicine and for 2 months after the last dose. Women should inform their doctor if they wish to become pregnant or think they might be pregnant. There is a potential for serious side effects to an unborn child. Talk to your health care professional or pharmacist for more information. Do not breast-feed an infant while taking this medicine or for 2 months after the last dose. What side effects may I notice from receiving this medicine? Side effects that you should report to your doctor or health care professional as soon as possible: allergic reactions like skin rash, itching or hives, swelling of the face, lips, or tongue breathing problems changes in vision eye pain fast, irregular heartbeat fever, chills mouth sores red spots on the skin redness, blistering, peeling or loosening of the skin, including inside the mouth signs and symptoms of kidney injury like trouble passing urine or change in the amount of urine signs and symptoms of low blood pressure like dizziness; feeling faint or lightheaded, falls; unusually weak or tired signs of low calcium like fast heartbeat, muscle cramps or muscle pain; pain, tingling, numbness in the hands or feet; seizures signs and symptoms of low magnesium like muscle cramps, pain, or weakness; tremors; seizures; or fast, irregular heartbeat signs and symptoms of low potassium like muscle cramps or muscle pain; chest pain; dizziness; feeling faint or lightheaded, falls; palpitations; breathing problems; or fast, irregular heartbeat swelling of the ankles, feet, hands Side effects that usually do not require medical attention (report to your doctor or health care professional if they continue or are bothersome): changes in skin like acne, cracks,  skin dryness diarrhea eyelash growth headache mouth sores nail changes nausea, vomiting This list may not describe all possible side effects. Call your doctor for medical advice about side effects. You may report side effects to FDA at 1-800-FDA-1088. Where should I keep my medicine? This drug is given in a hospital or clinic and will not be stored at home. NOTE: This sheet is a summary. It may not cover all possible information. If you have questions about this medicine, talk to your doctor, pharmacist, or health care provider.  2021 Elsevier/Gold Standard (2015-08-12 16:45:04)  Irinotecan injection What is this medicine? IRINOTECAN (ir in oh TEE kan ) is a chemotherapy drug. It is used to treat colon and rectal cancer. This medicine may be used for other purposes; ask your health care provider or pharmacist if you have questions. COMMON BRAND NAME(S): Camptosar What should I tell my health care provider before I take this medicine? They need to know if you have any of these conditions: dehydration diarrhea infection (especially a virus infection such as chickenpox, cold sores, or herpes) liver disease low blood counts, like low white cell, platelet, or red cell counts low levels of calcium, magnesium, or potassium in the blood recent or ongoing radiation therapy an unusual or allergic reaction to irinotecan, other medicines, foods, dyes, or preservatives pregnant or trying to get pregnant breast-feeding How should I use this medicine? This drug is given as an infusion  into a vein. It is administered in a hospital or clinic by a specially trained health care professional. Talk to your pediatrician regarding the use of this medicine in children. Special care may be needed. Overdosage: If you think you have taken too much of this medicine contact a poison control center or emergency room at once. NOTE: This medicine is only for you. Do not share this medicine with others. What if I  miss a dose? It is important not to miss your dose. Call your doctor or health care professional if you are unable to keep an appointment. What may interact with this medicine? Do not take this medicine with any of the following medications: cobicistat itraconazole This medicine may interact with the following medications: antiviral medicines for HIV or AIDS certain antibiotics like rifampin or rifabutin certain medicines for fungal infections like ketoconazole, posaconazole, and voriconazole certain medicines for seizures like carbamazepine, phenobarbital, phenotoin clarithromycin gemfibrozil nefazodone [facility]. John's Wort This list may not describe all possible interactions. Give your health care provider a list of all the medicines, herbs, non-prescription drugs, or dietary supplements you use. Also tell them if you smoke, drink alcohol, or use illegal drugs. Some items may interact with your medicine. What should I watch for while using this medicine? Your condition will be monitored carefully while you are receiving this medicine. You will need important blood work done while you are taking this medicine. This drug may make you feel generally unwell. This is not uncommon, as chemotherapy can affect healthy cells as well as cancer cells. Report any side effects. Continue your course of treatment even though you feel ill unless your doctor tells you to stop. In some cases, you may be given additional medicines to help with side effects. Follow all directions for their use. You may get drowsy or dizzy. Do not drive, use machinery, or do anything that needs mental alertness until you know how this medicine affects you. Do not stand or sit up quickly, especially if you are an older patient. This reduces the risk of dizzy or fainting spells. Call your health care professional for advice if you get a fever, chills, or sore throat, or other symptoms of a cold or flu. Do not treat yourself. This  medicine decreases your body's ability to fight infections. Try to avoid being around people who are sick. Avoid taking products that contain aspirin, acetaminophen, ibuprofen, naproxen, or ketoprofen unless instructed by your doctor. These medicines may hide a fever. This medicine may increase your risk to bruise or bleed. Call your doctor or health care professional if you notice any unusual bleeding. Be careful brushing and flossing your teeth or using a toothpick because you may get an infection or bleed more easily. If you have any dental work done, tell your dentist you are receiving this medicine. Do not become pregnant while taking this medicine or for 6 months after stopping it. Women should inform their health care professional if they wish to become pregnant or think they might be pregnant. Men should not father a child while taking this medicine and for 3 months after stopping it. There is potential for serious side effects to an unborn child. Talk to your health care professional for more information. Do not breast-feed an infant while taking this medicine or for 7 days after stopping it. This medicine has caused ovarian failure in some women. This medicine may make it more difficult to get pregnant. Talk to your health care professional if you are  concerned about your fertility. This medicine has caused decreased sperm counts in some men. This may make it more difficult to father a child. Talk to your health care professional if you are concerned about your fertility. What side effects may I notice from receiving this medicine? Side effects that you should report to your doctor or health care professional as soon as possible: allergic reactions like skin rash, itching or hives, swelling of the face, lips, or tongue chest pain diarrhea flushing, runny nose, sweating during infusion low blood counts - this medicine may decrease the number of white blood cells, red blood cells and platelets.  You may be at increased risk for infections and bleeding. nausea, vomiting pain, swelling, warmth in the leg signs of decreased platelets or bleeding - bruising, pinpoint red spots on the skin, black, tarry stools, blood in the urine signs of infection - fever or chills, cough, sore throat, pain or difficulty passing urine signs of decreased red blood cells - unusually weak or tired, fainting spells, lightheadedness Side effects that usually do not require medical attention (report to your doctor or health care professional if they continue or are bothersome): constipation hair loss headache loss of appetite mouth sores stomach pain This list may not describe all possible side effects. Call your doctor for medical advice about side effects. You may report side effects to FDA at 1-800-FDA-1088. Where should I keep my medicine? This drug is given in a hospital or clinic and will not be stored at home. NOTE: This sheet is a summary. It may not cover all possible information. If you have questions about this medicine, talk to your doctor, pharmacist, or health care provider.  2021 Elsevier/Gold Standard (2018-12-23 17:46:13)

## 2020-08-04 NOTE — Patient Instructions (Signed)
Implanted Port Home Guide An implanted port is a device that is placed under the skin. It is usually placed in the chest. The device can be used to give IV medicine, to take blood, or for dialysis. You may have an implanted port if: You need IV medicine that would be irritating to the small veins in your hands or arms. You need IV medicines, such as antibiotics, for a long period of time. You need IV nutrition for a long period of time. You need dialysis. When you have a port, your health care provider can choose to use the port instead of veins in your arms for these procedures. You may have fewer limitations when using a port than you would if you used other types of long-term IVs, and you will likely be able to return to normal activities afteryour incision heals. An implanted port has two main parts: Reservoir. The reservoir is the part where a needle is inserted to give medicines or draw blood. The reservoir is round. After it is placed, it appears as a small, raised area under your skin. Catheter. The catheter is a thin, flexible tube that connects the reservoir to a vein. Medicine that is inserted into the reservoir goes into the catheter and then into the vein. How is my port accessed? To access your port: A numbing cream may be placed on the skin over the port site. Your health care provider will put on a mask and sterile gloves. The skin over your port will be cleaned carefully with a germ-killing soap and allowed to dry. Your health care provider will gently pinch the port and insert a needle into it. Your health care provider will check for a blood return to make sure the port is in the vein and is not clogged. If your port needs to remain accessed to get medicine continuously (constant infusion), your health care provider will place a clear bandage (dressing) over the needle site. The dressing and needle will need to be changed every week, or as told by your health care provider. What  is flushing? Flushing helps keep the port from getting clogged. Follow instructions from your health care provider about how and when to flush the port. Ports are usually flushed with saline solution or a medicine called heparin. The need for flushing will depend on how the port is used: If the port is only used from time to time to give medicines or draw blood, the port may need to be flushed: Before and after medicines have been given. Before and after blood has been drawn. As part of routine maintenance. Flushing may be recommended every 4-6 weeks. If a constant infusion is running, the port may not need to be flushed. Throw away any syringes in a disposal container that is meant for sharp items (sharps container). You can buy a sharps container from a pharmacy, or you can make one by using an empty hard plastic bottle with a cover. How long will my port stay implanted? The port can stay in for as long as your health care provider thinks it is needed. When it is time for the port to come out, a surgery will be done to remove it. The surgery will be similar to the procedure that was done to putthe port in. Follow these instructions at home:  Flush your port as told by your health care provider. If you need an infusion over several days, follow instructions from your health care provider about how to take   care of your port site. Make sure you: Wash your hands with soap and water before you change your dressing. If soap and water are not available, use alcohol-based hand sanitizer. Change your dressing as told by your health care provider. Place any used dressings or infusion bags into a plastic bag. Throw that bag in the trash. Keep the dressing that covers the needle clean and dry. Do not get it wet. Do not use scissors or sharp objects near the tube. Keep the tube clamped, unless it is being used. Check your port site every day for signs of infection. Check for: Redness, swelling, or  pain. Fluid or blood. Pus or a bad smell. Protect the skin around the port site. Avoid wearing bra straps that rub or irritate the site. Protect the skin around your port from seat belts. Place a soft pad over your chest if needed. Bathe or shower as told by your health care provider. The site may get wet as long as you are not actively receiving an infusion. Return to your normal activities as told by your health care provider. Ask your health care provider what activities are safe for you. Carry a medical alert card or wear a medical alert bracelet at all times. This will let health care providers know that you have an implanted port in case of an emergency. Get help right away if: You have redness, swelling, or pain at the port site. You have fluid or blood coming from your port site. You have pus or a bad smell coming from the port site. You have a fever. Summary Implanted ports are usually placed in the chest for long-term IV access. Follow instructions from your health care provider about flushing the port and changing bandages (dressings). Take care of the area around your port by avoiding clothing that puts pressure on the area, and by watching for signs of infection. Protect the skin around your port from seat belts. Place a soft pad over your chest if needed. Get help right away if you have a fever or you have redness, swelling, pain, drainage, or a bad smell at the port site. This information is not intended to replace advice given to you by your health care provider. Make sure you discuss any questions you have with your healthcare provider. Document Revised: 06/08/2019 Document Reviewed: 06/08/2019 Elsevier Patient Education  2022 [employer]  

## 2020-08-04 NOTE — Telephone Encounter (Signed)
Error

## 2020-08-06 ENCOUNTER — Inpatient Hospital Stay: Payer: BC Managed Care – PPO | Attending: Oncology

## 2020-08-06 ENCOUNTER — Other Ambulatory Visit: Payer: Self-pay

## 2020-08-06 VITALS — BP 127/81 | HR 78 | Temp 98.6°F | Resp 18 | Ht 66.0 in

## 2020-08-06 DIAGNOSIS — R197 Diarrhea, unspecified: Secondary | ICD-10-CM | POA: Diagnosis not present

## 2020-08-06 DIAGNOSIS — Z5112 Encounter for antineoplastic immunotherapy: Secondary | ICD-10-CM | POA: Diagnosis not present

## 2020-08-06 DIAGNOSIS — Z5111 Encounter for antineoplastic chemotherapy: Secondary | ICD-10-CM | POA: Insufficient documentation

## 2020-08-06 DIAGNOSIS — C7961 Secondary malignant neoplasm of right ovary: Secondary | ICD-10-CM | POA: Diagnosis not present

## 2020-08-06 DIAGNOSIS — Z5189 Encounter for other specified aftercare: Secondary | ICD-10-CM | POA: Insufficient documentation

## 2020-08-06 DIAGNOSIS — C787 Secondary malignant neoplasm of liver and intrahepatic bile duct: Secondary | ICD-10-CM | POA: Insufficient documentation

## 2020-08-06 DIAGNOSIS — C182 Malignant neoplasm of ascending colon: Secondary | ICD-10-CM | POA: Diagnosis not present

## 2020-08-06 MED ORDER — PEGFILGRASTIM-CBQV 6 MG/0.6ML ~~LOC~~ SOSY
6.0000 mg | PREFILLED_SYRINGE | Freq: Once | SUBCUTANEOUS | Status: AC
  Administered 2020-08-06: 6 mg via SUBCUTANEOUS

## 2020-08-07 ENCOUNTER — Encounter: Payer: Self-pay | Admitting: Nurse Practitioner

## 2020-08-09 ENCOUNTER — Inpatient Hospital Stay: Payer: BC Managed Care – PPO

## 2020-08-09 ENCOUNTER — Other Ambulatory Visit: Payer: Self-pay

## 2020-08-09 ENCOUNTER — Inpatient Hospital Stay (HOSPITAL_BASED_OUTPATIENT_CLINIC_OR_DEPARTMENT_OTHER): Payer: BC Managed Care – PPO

## 2020-08-09 ENCOUNTER — Encounter: Payer: Self-pay | Admitting: Nurse Practitioner

## 2020-08-09 DIAGNOSIS — C7961 Secondary malignant neoplasm of right ovary: Secondary | ICD-10-CM | POA: Diagnosis not present

## 2020-08-09 DIAGNOSIS — Z5112 Encounter for antineoplastic immunotherapy: Secondary | ICD-10-CM | POA: Diagnosis not present

## 2020-08-09 DIAGNOSIS — Z5111 Encounter for antineoplastic chemotherapy: Secondary | ICD-10-CM | POA: Diagnosis not present

## 2020-08-09 DIAGNOSIS — C182 Malignant neoplasm of ascending colon: Secondary | ICD-10-CM

## 2020-08-09 DIAGNOSIS — C787 Secondary malignant neoplasm of liver and intrahepatic bile duct: Secondary | ICD-10-CM | POA: Diagnosis not present

## 2020-08-09 DIAGNOSIS — R197 Diarrhea, unspecified: Secondary | ICD-10-CM | POA: Diagnosis not present

## 2020-08-09 DIAGNOSIS — Z5189 Encounter for other specified aftercare: Secondary | ICD-10-CM | POA: Diagnosis not present

## 2020-08-09 LAB — CMP (CANCER CENTER ONLY)
ALT: 49 U/L — ABNORMAL HIGH (ref 0–44)
AST: 36 U/L (ref 15–41)
Albumin: 4.2 g/dL (ref 3.5–5.0)
Alkaline Phosphatase: 149 U/L — ABNORMAL HIGH (ref 38–126)
Anion gap: 7 (ref 5–15)
BUN: 10 mg/dL (ref 6–20)
CO2: 28 mmol/L (ref 22–32)
Calcium: 9.5 mg/dL (ref 8.9–10.3)
Chloride: 102 mmol/L (ref 98–111)
Creatinine: 0.57 mg/dL (ref 0.44–1.00)
GFR, Estimated: >60 mL/min (ref >60)
Glucose, Bld: 178 mg/dL — ABNORMAL HIGH (ref 70–99)
Potassium: 3.6 mmol/L (ref 3.5–5.1)
Sodium: 137 mmol/L (ref 135–145)
Total Bilirubin: 0.6 mg/dL (ref 0.3–1.2)
Total Protein: 6.8 g/dL (ref 6.5–8.1)

## 2020-08-09 LAB — MAGNESIUM: Magnesium: 1.8 mg/dL (ref 1.7–2.4)

## 2020-08-09 MED ORDER — SODIUM CHLORIDE 0.9 % IV SOLN
INTRAVENOUS | Status: DC
  Filled 2020-08-09: qty 250

## 2020-08-09 NOTE — Patient Instructions (Signed)
[facility]  Discharge Instructions: Thank you for choosing [facility] to provide your oncology and hematology care.   If you have a lab appointment with the [facility], please go directly to the [facility] and check in at the registration area.   Wear comfortable clothing and clothing appropriate for easy access to any Portacath or PICC line.   We strive to give you quality time with your provider. You may need to reschedule your appointment if you arrive late (15 or more minutes).  Arriving late affects you and other patients whose appointments are after yours.  Also, if you miss three or more appointments without notifying the office, you may be dismissed from the clinic at the provider's discretion.      For prescription refill requests, have your pharmacy contact our office and allow 72 hours for refills to be completed.    Today you received the following chemotherapy and/or immunotherapy agents IV fluids      To help prevent nausea and vomiting after your treatment, we encourage you to take your nausea medication as directed.  BELOW ARE SYMPTOMS THAT SHOULD BE REPORTED IMMEDIATELY: *FEVER GREATER THAN 100.4 F (38 C) OR HIGHER *CHILLS OR SWEATING *NAUSEA AND VOMITING THAT IS NOT CONTROLLED WITH YOUR NAUSEA MEDICATION *UNUSUAL SHORTNESS OF BREATH *UNUSUAL BRUISING OR BLEEDING *URINARY PROBLEMS (pain or burning when urinating, or frequent urination) *BOWEL PROBLEMS (unusual diarrhea, constipation, pain near the anus) TENDERNESS IN MOUTH AND THROAT WITH OR WITHOUT PRESENCE OF ULCERS (sore throat, sores in mouth, or a toothache) UNUSUAL RASH, SWELLING OR PAIN  UNUSUAL VAGINAL DISCHARGE OR ITCHING   Items with * indicate a potential emergency and should be followed up as soon as possible or go to the Emergency Department if any problems should occur.  Please show the CHEMOTHERAPY ALERT CARD or IMMUNOTHERAPY ALERT CARD at check-in to the  Emergency Department and triage nurse.  Should you have questions after your visit or need to cancel or reschedule your appointment, please contact [facility]  Dept: [phone]  and follow the prompts.  Office hours are 8:00 a.m. to 4:30 p.m. Monday - Friday. Please note that voicemails left after 4:00 p.m. may not be returned until the following business day.  We are closed weekends and major holidays. You have access to a nurse at all times for urgent questions. Please call the main number to the clinic Dept: [phone] and follow the prompts.   For any non-urgent questions, you may also contact your provider using MyChart. We now offer e-Visits for anyone 89 and older to request care online for non-urgent symptoms. For details visit [email or web address].   Also download the MyChart app! Go to the app store, search "MyChart", open the app, select [facility], and log in with your MyChart username and password.  Due to Covid, a mask is required upon entering the hospital/clinic. If you do not have a mask, one will be given to you upon arrival. For doctor visits, patients may have 1 support person aged 37 or older with them. For treatment visits, patients cannot have anyone with them due to current Covid guidelines and our immunocompromised population.   Dehydration, Adult Dehydration is a condition in which there is not enough water or other fluids in the body. This happens when a person loses more fluids than he or she takes in. Important organs, such as the kidneys, brain, and heart, cannot function without a proper amount  of fluids. Any loss of fluids from the body can lead todehydration. Dehydration can be mild, moderate, or severe. It should be treated right awayto prevent it from becoming severe. What are the causes? Dehydration may be caused by: Conditions that cause loss of water or other fluids, such as diarrhea, vomiting, or sweating or urinating a  lot. Not drinking enough fluids, especially when you are ill or doing activities that require a lot of energy. Other illnesses and conditions, such as fever or infection. Certain medicines, such as medicines that remove excess fluid from the body (diuretics). Lack of safe drinking water. Not being able to get enough water and food. What increases the risk? The following factors may make you more likely to develop this condition: Having a long-term (chronic) illness that has not been treated properly, such as diabetes, heart disease, or kidney disease. Being 65 years of age or older. Having a disability. Living in a place that is high in altitude, where thinner, drier air causes more fluid loss. Doing exercises that put stress on your body for a long time (endurance sports). What are the signs or symptoms? Symptoms of dehydration depend on how severe it is. Mild or moderate dehydration Thirst. Dry lips or dry mouth. Dizziness or light-headedness, especially when standing up from a seated position. Muscle cramps. Dark urine. Urine may be the color of tea. Less urine or tears produced than usual. Headache. Severe dehydration Changes in skin. Your skin may be cold and clammy, blotchy, or pale. Your skin also may not return to normal after being lightly pinched and released. Little or no tears, urine, or sweat. Changes in vital signs, such as rapid breathing and low blood pressure. Your pulse may be weak or may be faster than 100 beats a minute when you are sitting still. Other changes, such as: Feeling very thirsty. Sunken eyes. Cold hands and feet. Confusion. Being very tired (lethargic) or having trouble waking from sleep. Short-term weight loss. Loss of consciousness. How is this diagnosed? This condition is diagnosed based on your symptoms and a physical exam. You mayhave blood and urine tests to help confirm the diagnosis. How is this treated? Treatment for this condition  depends on how severe it is. Treatment should be started right away. Do not wait until dehydration becomes severe. Severe dehydration is an emergency and needs to be treated in a hospital. Mild or moderate dehydration can be treated at home. You may be asked to: Drink more fluids. Drink an oral rehydration solution (ORS). This drink helps restore proper amounts of fluids and salts and minerals in the blood (electrolytes). Severe dehydration can be treated: With IV fluids. By correcting abnormal levels of electrolytes. This is often done by giving electrolytes through a tube that is passed through your nose and into your stomach (nasogastric tube, or NG tube). By treating the underlying cause of dehydration. Follow these instructions at home: Oral rehydration solution If told by your health care provider, drink an ORS: Make an ORS by following instructions on the package. Start by drinking small amounts, about  cup (120 mL) every 5-10 minutes. Slowly increase how much you drink until you have taken the amount recommended by your health care provider. Eating and drinking        Drink enough clear fluid to keep your urine pale yellow. If you were told to drink an ORS, finish the ORS first and then start slowly drinking other clear fluids. Drink fluids such as: Water. Do not drink  only water. Doing that can lead to hyponatremia, which is having too little salt (sodium) in the body. Water from ice chips you suck on. Fruit juice that you have added water to (diluted fruit juice). Low-calorie sports drinks. Eat foods that contain a healthy balance of electrolytes, such as bananas, oranges, potatoes, tomatoes, and spinach. Do not drink alcohol. Avoid the following: Drinks that contain a lot of sugar. These include high-calorie sports drinks, fruit juice that is not diluted, and soda. Caffeine. Foods that are greasy or contain a lot of fat or sugar. General instructions Take over-the-counter  and prescription medicines only as told by your health care provider. Do not take sodium tablets. Doing that can lead to having too much sodium in the body (hypernatremia). Return to your normal activities as told by your health care provider. Ask your health care provider what activities are safe for you. Keep all follow-up visits as told by your health care provider. This is important. Contact a health care provider if: You have muscle cramps, pain, or discomfort, such as: Pain in your abdomen and the pain gets worse or stays in one area (localizes). Stiff neck. You have a rash. You are more irritable than usual. You are sleepier or have a harder time waking than usual. You feel weak or dizzy. You feel very thirsty. Get help right away if you have: Any symptoms of severe dehydration. Symptoms of vomiting, such as: You cannot eat or drink without vomiting. Vomiting gets worse or does not go away. Vomit includes blood or green matter (bile). Symptoms that get worse with treatment. A fever. A severe headache. Problems with urination or bowel movements, such as: Diarrhea that gets worse or does not go away. Blood in your stool (feces). This may cause stool to look black and tarry. Not urinating, or urinating only a small amount of very dark urine, within 6-8 hours. Trouble breathing. These symptoms may represent a serious problem that is an emergency. Do not wait to see if the symptoms will go away. Get medical help right away. Call your local emergency services (911 in the [place] Do not drive yourself to the hospital. Summary Dehydration is a condition in which there is not enough water or other fluids in the body. This happens when a person loses more fluids than he or she takes in. Treatment for this condition depends on how severe it is. Treatment should be started right away. Do not wait until dehydration becomes severe. Drink enough clear fluid to keep your urine pale yellow. If  you were told to drink an oral rehydration solution (ORS), finish the ORS first and then start slowly drinking other clear fluids. Take over-the-counter and prescription medicines only as told by your health care provider. Get help right away if you have any symptoms of severe dehydration. This information is not intended to replace advice given to you by your health care provider. Make sure you discuss any questions you have with your healthcare provider. Document Revised: 09/04/2018 Document Reviewed: 09/04/2018 Elsevier Patient Education  [place].

## 2020-08-10 ENCOUNTER — Other Ambulatory Visit: Payer: Self-pay

## 2020-08-10 NOTE — Progress Notes (Signed)
The proposed treatment discussed in conference is for discussion purpose only and is not a binding recommendation.  The patients have not been physically examined, or presented with their treatment options.  Therefore, final treatment plans cannot be decided.  

## 2020-08-13 ENCOUNTER — Other Ambulatory Visit: Payer: Self-pay | Admitting: Oncology

## 2020-08-15 ENCOUNTER — Inpatient Hospital Stay (HOSPITAL_BASED_OUTPATIENT_CLINIC_OR_DEPARTMENT_OTHER): Payer: BC Managed Care – PPO | Admitting: Nurse Practitioner

## 2020-08-15 ENCOUNTER — Inpatient Hospital Stay: Payer: BC Managed Care – PPO

## 2020-08-15 ENCOUNTER — Other Ambulatory Visit: Payer: Self-pay

## 2020-08-15 VITALS — BP 136/78 | HR 74 | Temp 97.7°F | Resp 18 | Ht 66.0 in | Wt 157.2 lb

## 2020-08-15 DIAGNOSIS — C7961 Secondary malignant neoplasm of right ovary: Secondary | ICD-10-CM | POA: Diagnosis not present

## 2020-08-15 DIAGNOSIS — C787 Secondary malignant neoplasm of liver and intrahepatic bile duct: Secondary | ICD-10-CM | POA: Diagnosis not present

## 2020-08-15 DIAGNOSIS — C182 Malignant neoplasm of ascending colon: Secondary | ICD-10-CM

## 2020-08-15 DIAGNOSIS — Z5111 Encounter for antineoplastic chemotherapy: Secondary | ICD-10-CM | POA: Diagnosis not present

## 2020-08-15 DIAGNOSIS — Z5189 Encounter for other specified aftercare: Secondary | ICD-10-CM | POA: Diagnosis not present

## 2020-08-15 DIAGNOSIS — R197 Diarrhea, unspecified: Secondary | ICD-10-CM | POA: Diagnosis not present

## 2020-08-15 DIAGNOSIS — Z5112 Encounter for antineoplastic immunotherapy: Secondary | ICD-10-CM | POA: Diagnosis not present

## 2020-08-15 LAB — CMP (CANCER CENTER ONLY)
ALT: 46 U/L — ABNORMAL HIGH (ref 0–44)
AST: 31 U/L (ref 15–41)
Albumin: 4.1 g/dL (ref 3.5–5.0)
Alkaline Phosphatase: 150 U/L — ABNORMAL HIGH (ref 38–126)
Anion gap: 11 (ref 5–15)
BUN: 7 mg/dL (ref 6–20)
CO2: 26 mmol/L (ref 22–32)
Calcium: 9.2 mg/dL (ref 8.9–10.3)
Chloride: 103 mmol/L (ref 98–111)
Creatinine: 0.57 mg/dL (ref 0.44–1.00)
GFR, Estimated: >60 mL/min (ref >60)
Glucose, Bld: 221 mg/dL — ABNORMAL HIGH (ref 70–99)
Potassium: 3.4 mmol/L — ABNORMAL LOW (ref 3.5–5.1)
Sodium: 140 mmol/L (ref 135–145)
Total Bilirubin: 0.4 mg/dL (ref 0.3–1.2)
Total Protein: 6.6 g/dL (ref 6.5–8.1)

## 2020-08-15 LAB — CBC WITH DIFFERENTIAL (CANCER CENTER ONLY)
Abs Immature Granulocytes: 0.05 10*3/uL (ref 0.00–0.07)
Basophils Absolute: 0 10*3/uL (ref 0.0–0.1)
Basophils Relative: 0 %
Eosinophils Absolute: 0.1 10*3/uL (ref 0.0–0.5)
Eosinophils Relative: 1 %
HCT: 36.9 % (ref 36.0–46.0)
Hemoglobin: 11.4 g/dL — ABNORMAL LOW (ref 12.0–15.0)
Immature Granulocytes: 1 %
Lymphocytes Relative: 12 %
Lymphs Abs: 0.9 10*3/uL (ref 0.7–4.0)
MCH: 24.5 pg — ABNORMAL LOW (ref 26.0–34.0)
MCHC: 30.9 g/dL (ref 30.0–36.0)
MCV: 79.2 fL — ABNORMAL LOW (ref 80.0–100.0)
Monocytes Absolute: 0.3 10*3/uL (ref 0.1–1.0)
Monocytes Relative: 4 %
Neutro Abs: 6.5 10*3/uL (ref 1.7–7.7)
Neutrophils Relative %: 82 %
Platelet Count: 145 10*3/uL — ABNORMAL LOW (ref 150–400)
RBC: 4.66 MIL/uL (ref 3.87–5.11)
RDW: 16.2 % — ABNORMAL HIGH (ref 11.5–15.5)
WBC Count: 8 10*3/uL (ref 4.0–10.5)
nRBC: 0 % (ref 0.0–0.2)

## 2020-08-15 LAB — CEA (ACCESS): CEA (CHCC): 25.91 ng/mL — ABNORMAL HIGH (ref 0.00–5.00)

## 2020-08-15 NOTE — Progress Notes (Signed)
[facility] OFFICE PROGRESS NOTE   Diagnosis: Colon cancer  INTERVAL HISTORY:   Ms. Amini returns as scheduled.  She completed another cycle of irinotecan/Panitumumab 08/04/2020.  She received a liter of IV fluids on 08/09/2020 due to poorly controlled diarrhea.  She continues to have loose stools.  Diarrhea is "a little better".  Rash has increased.  She notes some sores in the nose and perineum/labia.  She reports dryness and discomfort with sexual intercourse.  No significant nausea/vomiting.  She notes bilateral hand tremors with intentional movement.   Objective:  Vital signs in last 24 hours:  Blood pressure 136/78, pulse 74, temperature 97.7 F (36.5 C), temperature source Oral, resp. rate 18, height 5' 6"  (1.676 m), weight 157 lb 3.2 oz (71.3 kg), SpO2 100 %.    HEENT: No thrush or ulcers. Resp: Lungs clear bilaterally. Cardio: Regular rate and rhythm. GI: Abdomen soft and nontender.  No hepatomegaly. Vascular: No leg edema. Skin: Small erythematous acne type lesions scattered over the trunk.  Scabbed erythematous lesion at the left parietal scalp. Port-A-Cath without erythema.  Lab Results:  Lab Results  Component Value Date   WBC 8.0 08/15/2020   HGB 11.4 (L) 08/15/2020   HCT 36.9 08/15/2020   MCV 79.2 (L) 08/15/2020   PLT 145 (L) 08/15/2020   NEUTROABS 6.5 08/15/2020    Imaging:  No results found.  Medications: I have reviewed the patient's current medications.  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 The tumor returned microsatellite stable with no loss of mismatch repair protein expression   APC mutated. No BRAF, KRAS, or NRAS mutation On [facility] 1 testing   Cycle 1 adjuvant FOLFOX 09/08/2013   Cycle 2 adjuvant FOLFOX 09/24/2013   Cycle 3 adjuvant FOLFOX 10/08/2013.   Cycle 4 adjuvant FOLFOX 10/22/2013.   Cycle 5  adjuvant FOLFOX 11/05/2013. Oxaliplatin held due to thrombocytopenia. Cycle 6 FOLFOX 11/19/2013. Cycle 7 FOLFOX 12/03/2013. Oxaliplatin held secondary to thrombocytopenia. Cycle 8 FOLFOX 12/17/2013. Cycle 9 FOLFOX 01/04/2014. Oxaliplatin held secondary to neutropenia.   Cycle 10 FOLFOX 01/21/2014. Oxaliplatin held secondary to thrombocytopenia. Cycle 11 FOLFOX 02/04/2014 Cycle 12 FOLFOX 02/18/2014, oxaliplatin dose reduced secondary tothrombocytopenia CT abdomen/pelvis 01/30/2014 revealed splenomegaly and no evidence of recurrent colon cancer CT chest 04/07/2014 with a stable right lower lobe nodule and no evidence for metastatic disease, no nodules seen on the CT 11/26/2014 Markedly elevated CEA 11/24/2014 CT 11/26/2014 revealed a right pelvic mass, splenomegaly, small volume ascites Right salpingo-oophorectomy 12/28/2014 with the pathology confirming metastatic colon cancer CTs 03/23/2016-no evidence of recurrent or metastatic disease. CT 11/26/2016-enlargement of a fluid density structure the right pelvic sidewall, no other evidence of metastatic disease CT aspiration right pelvic cyst 12/19/2016.  Cytology-BENIGN REACTIVE/REPARATIVE CHANGES. CTs 06/05/2017- no evidence of metastatic disease, mild cirrhotic changes with splenomegaly CTs 12/11/2017- recurrent cystic right adnexal mass, similar to on the CT 11/26/2016, stable mild splenomegaly CTs 04/23/2018- enlargement of cystic right adnexal mass with mural nodularity, no other evidence of metastatic disease Cytoreductive surgery/HIPEC with mitomycin by Dr. Clovis Riley at [facility] 08/12/2018-R1 resection achieved.  Cytoreduction included omentectomy, LAR, right salpingo-oophorectomy and left colonic gutter/pelvic stripping.  Pathology on the rectum showed recurrent/metastatic adenocarcinoma, tumor 2.0 cm, predominantly involving the subserosa and muscularis propria of the colon, proximal and distal margins of resection were negative, vascular invasion  present, metastatic carcinoma present in 1 out of 5 lymph nodes; omentum resection with no malignancy seen, no metastatic  carcinoma identified in 1 lymph node examined; left gutter stripping positive metastatic adenocarcinoma; right ovary resection positive metastatic adenocarcinoma. 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 CEA 68 02/03/2019 Biopsy liver lesion 02/11/2019-metastatic adenocarcinoma consistent with primary colonic adenocarcinoma CTs 02/27/2019-multiple liver lesions increased in size, new lesion in the lateral right lobe of the liver.  No evidence of metastatic disease in the chest.  Redemonstrated moderate left hydronephrosis and proximal hydroureter without discrete lesion or obstructing etiology at the transition point of the mid ureter. Cycle 1 FOLFIRI/Panitumumab 03/12/2019 Cycle 2 FOLFIRI/Panitumumab 03/26/2019-bolus 5-FU and irinotecan held secondary to neutropenia Cycle 3 FOLFIRI/Panitumumab 04/09/2019, Udenyca added-not given secondary to seizure/discontinuation of the 5-FU pump CT abdomen/pelvis at [facility] 05/04/2019-no residual fluid collection at the left abdominal wall abscess, stable moderate left hydronephrosis, multifocal indeterminate liver lesions--liver lesions significantly improved Cycle 4 FOLFIRI/Panitumumab 05/07/2019, Udenyca Cycle 5 FOLFIRI/Panitumumab 05/20/2019, Udenyca Cycle 6 FOLFIRI/Panitumumab 06/18/2019, Udenyca Cycle 7 FOLFIRI/Panitumumab 07/01/2019 (Irinotecan dose reduced due to thrombocytopenia), Udenyca--Udenyca was not given Cycle 8 FOLFIRI/Panitumumab 07/15/2019, Udenyca Cycle 9 FOLFIRI/Panitumumab 07/29/2019, Udenyca Cycle 10 FOLFIRI/Panitumumab 08/13/2019, Udenyca CTs at [facility] 08/19/2019-decreased size of the hypodense and hyperdense lesions within segment 2 of the left hepatic lobe.  No new lesions identified.  Similar presacral soft tissue thickening with adjacent stable  alignment in the rectum.  Improved but persistent left UPJ obstruction with mild left hydronephrosis.  Persistent but decreased size of the wound within the left mid abdomen abdominal wall. Cycle 11 irinotecan/Panitumumab 08/27/2019, Udenyca Cycle 12 irinotecan/Panitumumab 10/08/2019, Udenyca Cycle 13 irinotecan/Panitumumab 11/05/2019, Udenyca CT abdomen/pelvis 11/19/2019-no new or progressive interval findings.  Stable appearance of the calcified lesion in the anterior left hepatic dome with second tiny low-density lesion in the dome of the lateral segment left liver.  Both lesions are markedly decreased since 02/27/2019.  Stable mild fullness left intrarenal collecting system and renal pelvis.  Similar appearance of abnormal soft tissue in the left paramidline subcutaneous fat with associated skin thickening, this likely correlates to the fistula. Cycle 14 irinotecan/Panitumumab 11/19/2019, Udenyca Cycle 15 irinotecan/Panitumumab 12/02/2019, Udenyca CT abdomen/pelvis at [facility] 02/11/2020- similar to slight decrease in segment 2 hypodense and hyperdense lesions (compared to a CT from July 2020), no new lesions, splenomegaly, no fluid collection or abscess, mild stranding adjacent to the incision with overlying skin thickening Cycle 16 irinotecan/Panitumumab 03/03/2020, Udenyca Cycle 17 irinotecan/Panitumumab 03/17/2020, Udenyca Cycle 18 irinotecan/panitumumab 04/07/2020, Udenyca Cycle 19 irinotecan/Panitumumab 04/28/2020, Udenyca Cycle 20 irinotecan/panitumumab 05/26/2020, Udenyca Cycle 21 irinotecan/panitumumab 06/30/2020, Udenyca CT abdomen/pelvis 07/19/2020-mildly increased hepatic steatosis.  Increased size of 2.8 cm hypervascular lesion segment 2 left hepatic lobe. Cycle 22 irinotecan/Panitumumab 07/21/2020, Udenyca MRI Abd 07/28/2020: liver lesions consistent with hepatic metastasis Cycle 23 irinotecan/Panitumumab 08/04/2020, Udenyca  Mild elevation of the CEA beginning January 2016 , normal on  05/19/2014   History of iron deficiency anemia   seizure disorder; seizure 04/09/2019.  Brain CT negative.  Now on Keppra. history of depression   4 mm right lower lobe nodule on a staging chest CT 09/08/2013 , stable on a CT 04/07/2014  Hospitalization 09/24/2013 through 09/26/2013 with fever and abdominal pain.   09/24/2013 urine culture positive for coag negative staph.   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  Scope advanced for approximately 25 cm.  Most proximal portion had necrotic appearing  debris.  Colostomy bag insufflated suggesting some type of communication between the pouch and the proximal colon.  Introduction of scope into the ostomy found to available directions.  1 toward the distal pouch with similar appearing necrotic debris encountered.  The other was about a 30 cm segment of normal-appearing colonic mucosa to the level of the previous right hemicolectomy ileocolonic anastomosis. 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  No malignancy.   16. COVID-19 + 09/10/2019 17.  Hospitalized with seizure 09/17/2019 through 09/19/2019-MRI brain without evidence of metastatic disease. Seen by neurology. Keppra dose increased. 19.  Rash secondary to Panitumumab    Disposition: Ms. Hankins appears stable.  She completed another cycle of irinotecan/Panitumumab 08/04/2020.  She again had severe diarrhea, required IV fluids.  The diarrhea has improved but persists to some degree.  We decided to hold today's treatment and reschedule for 1 week.  Her case was presented at the GI tumor conference.  She may be a candidate for ablation/SBRT.  Dr. Benay Spice recommends a referral to Dr. Fayrene Helper to consider options for hepatic directed therapy.  She agrees with this plan.  Referral made today.  She will return for lab and irinotecan/Panitumumab in 1 week.  We will see her in follow-up on 09/12/2020.  She will contact the office in the interim with any problems.  Patient seen with Dr. Benay Spice.  Ned Card ANP/GNP-BC   08/15/2020  10:05 AM This was a shared visit Ned Card.  We discussed treatment options with Ms. Owens Shark.  Her case was presented at the GI tumor conference last week.  The hepatic lesions do not all appear amenable to ablation, though she may be a candidate for  combined ablation/SBRT therapy.  I will refer her to Dr. Fayrene Helper to see whether she may be a candidate for hepatic directed therapy such as a hepatic infusion pump.  She would like to have a break from systemic therapy when possible.  The plan is to continue irinotecan/panitumumab and refer her to Dr. Fayrene Helper.   Irinotecan will be held this week and dose reduced secondary to prolonged diarrhea.  I was present for greater than 50% of today's visit.  I performed medical stage making.  Julieanne Manson, MD

## 2020-08-16 ENCOUNTER — Telehealth: Payer: Self-pay | Admitting: Oncology

## 2020-08-16 ENCOUNTER — Other Ambulatory Visit: Payer: Self-pay | Admitting: Nurse Practitioner

## 2020-08-16 DIAGNOSIS — C787 Secondary malignant neoplasm of liver and intrahepatic bile duct: Secondary | ICD-10-CM

## 2020-08-16 DIAGNOSIS — C182 Malignant neoplasm of ascending colon: Secondary | ICD-10-CM

## 2020-08-16 NOTE — Telephone Encounter (Signed)
Release: [id number] Outgoing Referral, faxed records to [facility]: Dr Fayrene Helper @ fax# [phone]

## 2020-08-17 ENCOUNTER — Inpatient Hospital Stay: Payer: BC Managed Care – PPO

## 2020-08-19 DIAGNOSIS — K760 Fatty (change of) liver, not elsewhere classified: Secondary | ICD-10-CM | POA: Diagnosis not present

## 2020-08-19 DIAGNOSIS — C189 Malignant neoplasm of colon, unspecified: Secondary | ICD-10-CM | POA: Diagnosis not present

## 2020-08-19 DIAGNOSIS — C787 Secondary malignant neoplasm of liver and intrahepatic bile duct: Secondary | ICD-10-CM | POA: Diagnosis not present

## 2020-08-22 ENCOUNTER — Other Ambulatory Visit: Payer: BC Managed Care – PPO

## 2020-08-22 ENCOUNTER — Other Ambulatory Visit: Payer: Self-pay | Admitting: Oncology

## 2020-08-22 ENCOUNTER — Telehealth: Payer: Self-pay | Admitting: Nurse Practitioner

## 2020-08-22 ENCOUNTER — Other Ambulatory Visit: Payer: Self-pay

## 2020-08-22 ENCOUNTER — Inpatient Hospital Stay: Payer: BC Managed Care – PPO

## 2020-08-22 ENCOUNTER — Ambulatory Visit: Payer: Self-pay

## 2020-08-22 VITALS — BP 103/63 | HR 68 | Temp 98.0°F | Resp 17

## 2020-08-22 DIAGNOSIS — C7961 Secondary malignant neoplasm of right ovary: Secondary | ICD-10-CM | POA: Diagnosis not present

## 2020-08-22 DIAGNOSIS — Z5112 Encounter for antineoplastic immunotherapy: Secondary | ICD-10-CM | POA: Diagnosis not present

## 2020-08-22 DIAGNOSIS — C182 Malignant neoplasm of ascending colon: Secondary | ICD-10-CM

## 2020-08-22 DIAGNOSIS — Z5189 Encounter for other specified aftercare: Secondary | ICD-10-CM | POA: Diagnosis not present

## 2020-08-22 DIAGNOSIS — R197 Diarrhea, unspecified: Secondary | ICD-10-CM | POA: Diagnosis not present

## 2020-08-22 DIAGNOSIS — Z5111 Encounter for antineoplastic chemotherapy: Secondary | ICD-10-CM | POA: Diagnosis not present

## 2020-08-22 DIAGNOSIS — C787 Secondary malignant neoplasm of liver and intrahepatic bile duct: Secondary | ICD-10-CM | POA: Diagnosis not present

## 2020-08-22 LAB — CBC WITH DIFFERENTIAL (CANCER CENTER ONLY)
Abs Immature Granulocytes: 0.03 10*3/uL (ref 0.00–0.07)
Basophils Absolute: 0 10*3/uL (ref 0.0–0.1)
Basophils Relative: 0 %
Eosinophils Absolute: 0.1 10*3/uL (ref 0.0–0.5)
Eosinophils Relative: 2 %
HCT: 38.9 % (ref 36.0–46.0)
Hemoglobin: 11.8 g/dL — ABNORMAL LOW (ref 12.0–15.0)
Immature Granulocytes: 0 %
Lymphocytes Relative: 14 %
Lymphs Abs: 0.9 10*3/uL (ref 0.7–4.0)
MCH: 24.3 pg — ABNORMAL LOW (ref 26.0–34.0)
MCHC: 30.3 g/dL (ref 30.0–36.0)
MCV: 80 fL (ref 80.0–100.0)
Monocytes Absolute: 0.3 10*3/uL (ref 0.1–1.0)
Monocytes Relative: 5 %
Neutro Abs: 5.3 10*3/uL (ref 1.7–7.7)
Neutrophils Relative %: 79 %
Platelet Count: 184 10*3/uL (ref 150–400)
RBC: 4.86 MIL/uL (ref 3.87–5.11)
RDW: 16.5 % — ABNORMAL HIGH (ref 11.5–15.5)
WBC Count: 6.7 10*3/uL (ref 4.0–10.5)
nRBC: 0 % (ref 0.0–0.2)

## 2020-08-22 LAB — CMP (CANCER CENTER ONLY)
ALT: 56 U/L — ABNORMAL HIGH (ref 0–44)
AST: 48 U/L — ABNORMAL HIGH (ref 15–41)
Albumin: 4 g/dL (ref 3.5–5.0)
Alkaline Phosphatase: 111 U/L (ref 38–126)
Anion gap: 11 (ref 5–15)
BUN: 9 mg/dL (ref 6–20)
CO2: 25 mmol/L (ref 22–32)
Calcium: 8.9 mg/dL (ref 8.9–10.3)
Chloride: 104 mmol/L (ref 98–111)
Creatinine: 0.63 mg/dL (ref 0.44–1.00)
GFR, Estimated: >60 mL/min (ref >60)
Glucose, Bld: 245 mg/dL — ABNORMAL HIGH (ref 70–99)
Potassium: 3.8 mmol/L (ref 3.5–5.1)
Sodium: 140 mmol/L (ref 135–145)
Total Bilirubin: 0.6 mg/dL (ref 0.3–1.2)
Total Protein: 6.9 g/dL (ref 6.5–8.1)

## 2020-08-22 MED ORDER — HEPARIN SOD (PORK) LOCK FLUSH 100 UNIT/ML IV SOLN
500.0000 [IU] | Freq: Once | INTRAVENOUS | Status: AC | PRN
  Administered 2020-08-22: 500 [IU]
  Filled 2020-08-22: qty 5

## 2020-08-22 MED ORDER — SODIUM CHLORIDE 0.9 % IV SOLN
100.0000 mg/m2 | Freq: Once | INTRAVENOUS | Status: AC
  Administered 2020-08-22: 180 mg via INTRAVENOUS
  Filled 2020-08-22: qty 9

## 2020-08-22 MED ORDER — DEXAMETHASONE SODIUM PHOSPHATE 10 MG/ML IJ SOLN
5.0000 mg | Freq: Once | INTRAMUSCULAR | Status: AC
  Administered 2020-08-22: 5 mg via INTRAVENOUS

## 2020-08-22 MED ORDER — SODIUM CHLORIDE 0.9 % IV SOLN
Freq: Once | INTRAVENOUS | Status: AC
  Filled 2020-08-22: qty 250

## 2020-08-22 MED ORDER — PALONOSETRON HCL INJECTION 0.25 MG/5ML
0.2500 mg | Freq: Once | INTRAVENOUS | Status: AC
  Administered 2020-08-22: 0.25 mg via INTRAVENOUS

## 2020-08-22 MED ORDER — ATROPINE SULFATE 1 MG/ML IJ SOLN
0.5000 mg | Freq: Once | INTRAMUSCULAR | Status: AC | PRN
  Administered 2020-08-22: 0.5 mg via INTRAVENOUS

## 2020-08-22 MED ORDER — SODIUM CHLORIDE 0.9 % IV SOLN
6.0000 mg/kg | Freq: Once | INTRAVENOUS | Status: AC
  Administered 2020-08-22: 400 mg via INTRAVENOUS
  Filled 2020-08-22: qty 20

## 2020-08-22 MED ORDER — ATROPINE SULFATE 1 MG/ML IJ SOLN
INTRAMUSCULAR | Status: AC
  Filled 2020-08-22: qty 1

## 2020-08-22 NOTE — Patient Instructions (Signed)
[facility]  Discharge Instructions: Thank you for choosing [facility] to provide your oncology and hematology care.   If you have a lab appointment with the [facility], please go directly to the [facility] and check in at the registration area.   Wear comfortable clothing and clothing appropriate for easy access to any Portacath or PICC line.   We strive to give you quality time with your provider. You may need to reschedule your appointment if you arrive late (15 or more minutes).  Arriving late affects you and other patients whose appointments are after yours.  Also, if you miss three or more appointments without notifying the office, you may be dismissed from the clinic at the provider's discretion.      For prescription refill requests, have your pharmacy contact our office and allow 72 hours for refills to be completed.    Today you received the following chemotherapy and/or immunotherapy agents Irinotecan and Vectibix      To help prevent nausea and vomiting after your treatment, we encourage you to take your nausea medication as directed.  BELOW ARE SYMPTOMS THAT SHOULD BE REPORTED IMMEDIATELY: *FEVER GREATER THAN 100.4 F (38 C) OR HIGHER *CHILLS OR SWEATING *NAUSEA AND VOMITING THAT IS NOT CONTROLLED WITH YOUR NAUSEA MEDICATION *UNUSUAL SHORTNESS OF BREATH *UNUSUAL BRUISING OR BLEEDING *URINARY PROBLEMS (pain or burning when urinating, or frequent urination) *BOWEL PROBLEMS (unusual diarrhea, constipation, pain near the anus) TENDERNESS IN MOUTH AND THROAT WITH OR WITHOUT PRESENCE OF ULCERS (sore throat, sores in mouth, or a toothache) UNUSUAL RASH, SWELLING OR PAIN  UNUSUAL VAGINAL DISCHARGE OR ITCHING   Items with * indicate a potential emergency and should be followed up as soon as possible or go to the Emergency Department if any problems should occur.  Please show the CHEMOTHERAPY ALERT CARD or IMMUNOTHERAPY ALERT CARD at  check-in to the Emergency Department and triage nurse.  Should you have questions after your visit or need to cancel or reschedule your appointment, please contact [facility]  Dept: [phone]  and follow the prompts.  Office hours are 8:00 a.m. to 4:30 p.m. Monday - Friday. Please note that voicemails left after 4:00 p.m. may not be returned until the following business day.  We are closed weekends and major holidays. You have access to a nurse at all times for urgent questions. Please call the main number to the clinic Dept: [phone] and follow the prompts.   For any non-urgent questions, you may also contact your provider using MyChart. We now offer e-Visits for anyone 63 and older to request care online for non-urgent symptoms. For details visit [email or web address].   Also download the MyChart app! Go to the app store, search "MyChart", open the app, select [facility], and log in with your MyChart username and password.  Due to Covid, a mask is required upon entering the hospital/clinic. If you do not have a mask, one will be given to you upon arrival. For doctor visits, patients may have 1 support person aged 65 or older with them. For treatment visits, patients cannot have anyone with them due to current Covid guidelines and our immunocompromised population.

## 2020-08-22 NOTE — Progress Notes (Signed)
Labs ordered for treatment

## 2020-08-22 NOTE — Patient Instructions (Signed)
Implanted Port Home Guide An implanted port is a device that is placed under the skin. It is usually placed in the chest. The device can be used to give IV medicine, to take blood, or for dialysis. You may have an implanted port if: You need IV medicine that would be irritating to the small veins in your hands or arms. You need IV medicines, such as antibiotics, for a long period of time. You need IV nutrition for a long period of time. You need dialysis. When you have a port, your health care provider can choose to use the port instead of veins in your arms for these procedures. You may have fewer limitations when using a port than you would if you used other types of long-term IVs, and you will likely be able to return to normal activities afteryour incision heals. An implanted port has two main parts: Reservoir. The reservoir is the part where a needle is inserted to give medicines or draw blood. The reservoir is round. After it is placed, it appears as a small, raised area under your skin. Catheter. The catheter is a thin, flexible tube that connects the reservoir to a vein. Medicine that is inserted into the reservoir goes into the catheter and then into the vein. How is my port accessed? To access your port: A numbing cream may be placed on the skin over the port site. Your health care provider will put on a mask and sterile gloves. The skin over your port will be cleaned carefully with a germ-killing soap and allowed to dry. Your health care provider will gently pinch the port and insert a needle into it. Your health care provider will check for a blood return to make sure the port is in the vein and is not clogged. If your port needs to remain accessed to get medicine continuously (constant infusion), your health care provider will place a clear bandage (dressing) over the needle site. The dressing and needle will need to be changed every week, or as told by your health care provider. What  is flushing? Flushing helps keep the port from getting clogged. Follow instructions from your health care provider about how and when to flush the port. Ports are usually flushed with saline solution or a medicine called heparin. The need for flushing will depend on how the port is used: If the port is only used from time to time to give medicines or draw blood, the port may need to be flushed: Before and after medicines have been given. Before and after blood has been drawn. As part of routine maintenance. Flushing may be recommended every 4-6 weeks. If a constant infusion is running, the port may not need to be flushed. Throw away any syringes in a disposal container that is meant for sharp items (sharps container). You can buy a sharps container from a pharmacy, or you can make one by using an empty hard plastic bottle with a cover. How long will my port stay implanted? The port can stay in for as long as your health care provider thinks it is needed. When it is time for the port to come out, a surgery will be done to remove it. The surgery will be similar to the procedure that was done to putthe port in. Follow these instructions at home:  Flush your port as told by your health care provider. If you need an infusion over several days, follow instructions from your health care provider about how to take   care of your port site. Make sure you: Wash your hands with soap and water before you change your dressing. If soap and water are not available, use alcohol-based hand sanitizer. Change your dressing as told by your health care provider. Place any used dressings or infusion bags into a plastic bag. Throw that bag in the trash. Keep the dressing that covers the needle clean and dry. Do not get it wet. Do not use scissors or sharp objects near the tube. Keep the tube clamped, unless it is being used. Check your port site every day for signs of infection. Check for: Redness, swelling, or  pain. Fluid or blood. Pus or a bad smell. Protect the skin around the port site. Avoid wearing bra straps that rub or irritate the site. Protect the skin around your port from seat belts. Place a soft pad over your chest if needed. Bathe or shower as told by your health care provider. The site may get wet as long as you are not actively receiving an infusion. Return to your normal activities as told by your health care provider. Ask your health care provider what activities are safe for you. Carry a medical alert card or wear a medical alert bracelet at all times. This will let health care providers know that you have an implanted port in case of an emergency. Get help right away if: You have redness, swelling, or pain at the port site. You have fluid or blood coming from your port site. You have pus or a bad smell coming from the port site. You have a fever. Summary Implanted ports are usually placed in the chest for long-term IV access. Follow instructions from your health care provider about flushing the port and changing bandages (dressings). Take care of the area around your port by avoiding clothing that puts pressure on the area, and by watching for signs of infection. Protect the skin around your port from seat belts. Place a soft pad over your chest if needed. Get help right away if you have a fever or you have redness, swelling, pain, drainage, or a bad smell at the port site. This information is not intended to replace advice given to you by your health care provider. Make sure you discuss any questions you have with your healthcare provider. Document Revised: 06/08/2019 Document Reviewed: 06/08/2019 Elsevier Patient Education  2022 [employer]  

## 2020-08-23 ENCOUNTER — Other Ambulatory Visit: Payer: Self-pay | Admitting: *Deleted

## 2020-08-23 ENCOUNTER — Encounter: Payer: Self-pay | Admitting: Oncology

## 2020-08-23 DIAGNOSIS — C182 Malignant neoplasm of ascending colon: Secondary | ICD-10-CM

## 2020-08-23 DIAGNOSIS — C787 Secondary malignant neoplasm of liver and intrahepatic bile duct: Secondary | ICD-10-CM

## 2020-08-23 MED ORDER — LORAZEPAM 0.5 MG PO TABS: 0.5000 mg | ORAL_TABLET | Freq: Two times a day (BID) | ORAL | 0 refills | Status: DC | PRN

## 2020-08-23 MED ORDER — DIPHENOXYLATE-ATROPINE 2.5-0.025 MG PO TABS: ORAL_TABLET | ORAL | 1 refills | Status: DC

## 2020-08-23 NOTE — Telephone Encounter (Signed)
Refill

## 2020-08-23 NOTE — Telephone Encounter (Signed)
Refills for Ativan and Lomotil approved per MD with OK to increase Lomotil to #120 tablets.

## 2020-08-24 ENCOUNTER — Inpatient Hospital Stay: Payer: BC Managed Care – PPO

## 2020-08-24 ENCOUNTER — Other Ambulatory Visit: Payer: Self-pay

## 2020-08-24 VITALS — BP 106/76 | HR 81 | Temp 98.2°F | Resp 18

## 2020-08-24 DIAGNOSIS — R197 Diarrhea, unspecified: Secondary | ICD-10-CM | POA: Diagnosis not present

## 2020-08-24 DIAGNOSIS — C787 Secondary malignant neoplasm of liver and intrahepatic bile duct: Secondary | ICD-10-CM | POA: Diagnosis not present

## 2020-08-24 DIAGNOSIS — C182 Malignant neoplasm of ascending colon: Secondary | ICD-10-CM | POA: Diagnosis not present

## 2020-08-24 DIAGNOSIS — C7961 Secondary malignant neoplasm of right ovary: Secondary | ICD-10-CM | POA: Diagnosis not present

## 2020-08-24 DIAGNOSIS — Z5111 Encounter for antineoplastic chemotherapy: Secondary | ICD-10-CM | POA: Diagnosis not present

## 2020-08-24 DIAGNOSIS — Z5112 Encounter for antineoplastic immunotherapy: Secondary | ICD-10-CM | POA: Diagnosis not present

## 2020-08-24 DIAGNOSIS — Z5189 Encounter for other specified aftercare: Secondary | ICD-10-CM | POA: Diagnosis not present

## 2020-08-24 MED ORDER — SODIUM CHLORIDE 0.9% FLUSH
10.0000 mL | INTRAVENOUS | Status: DC | PRN
  Filled 2020-08-24: qty 10

## 2020-08-24 MED ORDER — PEGFILGRASTIM-CBQV 6 MG/0.6ML ~~LOC~~ SOSY
6.0000 mg | PREFILLED_SYRINGE | Freq: Once | SUBCUTANEOUS | Status: AC
  Administered 2020-08-24: 6 mg via SUBCUTANEOUS

## 2020-08-24 MED ORDER — HEPARIN SOD (PORK) LOCK FLUSH 100 UNIT/ML IV SOLN
500.0000 [IU] | Freq: Once | INTRAVENOUS | Status: DC | PRN
  Filled 2020-08-24: qty 5

## 2020-08-24 NOTE — Patient Instructions (Signed)

## 2020-08-25 ENCOUNTER — Inpatient Hospital Stay: Payer: BC Managed Care – PPO

## 2020-08-25 ENCOUNTER — Other Ambulatory Visit: Payer: Self-pay | Admitting: Nurse Practitioner

## 2020-08-25 VITALS — BP 115/78 | HR 80 | Temp 98.8°F | Resp 18

## 2020-08-25 DIAGNOSIS — Z5189 Encounter for other specified aftercare: Secondary | ICD-10-CM | POA: Diagnosis not present

## 2020-08-25 DIAGNOSIS — Z5111 Encounter for antineoplastic chemotherapy: Secondary | ICD-10-CM | POA: Diagnosis not present

## 2020-08-25 DIAGNOSIS — C182 Malignant neoplasm of ascending colon: Secondary | ICD-10-CM | POA: Diagnosis not present

## 2020-08-25 DIAGNOSIS — C787 Secondary malignant neoplasm of liver and intrahepatic bile duct: Secondary | ICD-10-CM

## 2020-08-25 DIAGNOSIS — C7961 Secondary malignant neoplasm of right ovary: Secondary | ICD-10-CM | POA: Diagnosis not present

## 2020-08-25 DIAGNOSIS — Z95828 Presence of other vascular implants and grafts: Secondary | ICD-10-CM

## 2020-08-25 DIAGNOSIS — R197 Diarrhea, unspecified: Secondary | ICD-10-CM | POA: Diagnosis not present

## 2020-08-25 DIAGNOSIS — Z5112 Encounter for antineoplastic immunotherapy: Secondary | ICD-10-CM | POA: Diagnosis not present

## 2020-08-25 MED ORDER — SODIUM CHLORIDE 0.9% FLUSH
10.0000 mL | Freq: Once | INTRAVENOUS | Status: AC
  Administered 2020-08-25: 10 mL
  Filled 2020-08-25: qty 10

## 2020-08-25 MED ORDER — HEPARIN SOD (PORK) LOCK FLUSH 100 UNIT/ML IV SOLN
500.0000 [IU] | Freq: Once | INTRAVENOUS | Status: DC
  Filled 2020-08-25: qty 5

## 2020-08-25 MED ORDER — SODIUM CHLORIDE 0.9 % IV SOLN
INTRAVENOUS | Status: AC
  Filled 2020-08-25 (×2): qty 250

## 2020-08-25 MED ORDER — SODIUM CHLORIDE 0.9% FLUSH
10.0000 mL | Freq: Once | INTRAVENOUS | Status: DC
  Filled 2020-08-25: qty 10

## 2020-08-25 MED ORDER — HEPARIN SOD (PORK) LOCK FLUSH 100 UNIT/ML IV SOLN
500.0000 [IU] | Freq: Once | INTRAVENOUS | Status: AC
  Administered 2020-08-25: 500 [IU]
  Filled 2020-08-25: qty 5

## 2020-08-25 NOTE — Patient Instructions (Signed)

## 2020-08-29 DIAGNOSIS — C189 Malignant neoplasm of colon, unspecified: Secondary | ICD-10-CM | POA: Diagnosis not present

## 2020-08-29 DIAGNOSIS — C787 Secondary malignant neoplasm of liver and intrahepatic bile duct: Secondary | ICD-10-CM | POA: Diagnosis not present

## 2020-09-12 ENCOUNTER — Other Ambulatory Visit: Payer: Self-pay

## 2020-09-12 ENCOUNTER — Inpatient Hospital Stay: Payer: BC Managed Care – PPO | Attending: Oncology | Admitting: Oncology

## 2020-09-12 ENCOUNTER — Inpatient Hospital Stay: Payer: BC Managed Care – PPO

## 2020-09-12 ENCOUNTER — Inpatient Hospital Stay (HOSPITAL_BASED_OUTPATIENT_CLINIC_OR_DEPARTMENT_OTHER): Payer: BC Managed Care – PPO | Admitting: Oncology

## 2020-09-12 ENCOUNTER — Encounter: Payer: Self-pay | Admitting: Oncology

## 2020-09-12 VITALS — BP 124/80 | HR 78 | Temp 97.8°F | Resp 20 | Ht 66.0 in | Wt 156.8 lb

## 2020-09-12 DIAGNOSIS — R197 Diarrhea, unspecified: Secondary | ICD-10-CM | POA: Diagnosis not present

## 2020-09-12 DIAGNOSIS — Z452 Encounter for adjustment and management of vascular access device: Secondary | ICD-10-CM | POA: Insufficient documentation

## 2020-09-12 DIAGNOSIS — C182 Malignant neoplasm of ascending colon: Secondary | ICD-10-CM | POA: Insufficient documentation

## 2020-09-12 DIAGNOSIS — Z95828 Presence of other vascular implants and grafts: Secondary | ICD-10-CM

## 2020-09-12 LAB — CMP (CANCER CENTER ONLY)
ALT: 56 U/L — ABNORMAL HIGH (ref 0–44)
AST: 50 U/L — ABNORMAL HIGH (ref 15–41)
Albumin: 4 g/dL (ref 3.5–5.0)
Alkaline Phosphatase: 118 U/L (ref 38–126)
Anion gap: 7 (ref 5–15)
BUN: 13 mg/dL (ref 6–20)
CO2: 27 mmol/L (ref 22–32)
Calcium: 8.9 mg/dL (ref 8.9–10.3)
Chloride: 105 mmol/L (ref 98–111)
Creatinine: 0.57 mg/dL (ref 0.44–1.00)
GFR, Estimated: >60 mL/min (ref >60)
Glucose, Bld: 176 mg/dL — ABNORMAL HIGH (ref 70–99)
Potassium: 4.2 mmol/L (ref 3.5–5.1)
Sodium: 139 mmol/L (ref 135–145)
Total Bilirubin: 0.5 mg/dL (ref 0.3–1.2)
Total Protein: 6.5 g/dL (ref 6.5–8.1)

## 2020-09-12 LAB — CBC WITH DIFFERENTIAL (CANCER CENTER ONLY)
Abs Immature Granulocytes: 0.01 10*3/uL (ref 0.00–0.07)
Basophils Absolute: 0 10*3/uL (ref 0.0–0.1)
Basophils Relative: 0 %
Eosinophils Absolute: 0.2 10*3/uL (ref 0.0–0.5)
Eosinophils Relative: 4 %
HCT: 37.6 % (ref 36.0–46.0)
Hemoglobin: 11.5 g/dL — ABNORMAL LOW (ref 12.0–15.0)
Immature Granulocytes: 0 %
Lymphocytes Relative: 16 %
Lymphs Abs: 0.9 10*3/uL (ref 0.7–4.0)
MCH: 24.3 pg — ABNORMAL LOW (ref 26.0–34.0)
MCHC: 30.6 g/dL (ref 30.0–36.0)
MCV: 79.3 fL — ABNORMAL LOW (ref 80.0–100.0)
Monocytes Absolute: 0.3 10*3/uL (ref 0.1–1.0)
Monocytes Relative: 6 %
Neutro Abs: 3.9 10*3/uL (ref 1.7–7.7)
Neutrophils Relative %: 74 %
Platelet Count: 164 10*3/uL (ref 150–400)
RBC: 4.74 MIL/uL (ref 3.87–5.11)
RDW: 16.9 % — ABNORMAL HIGH (ref 11.5–15.5)
WBC Count: 5.4 10*3/uL (ref 4.0–10.5)
nRBC: 0 % (ref 0.0–0.2)

## 2020-09-12 LAB — CEA (ACCESS): CEA (CHCC): 33.63 ng/mL — ABNORMAL HIGH (ref 0.00–5.00)

## 2020-09-12 MED ORDER — HEPARIN SOD (PORK) LOCK FLUSH 100 UNIT/ML IV SOLN
500.0000 [IU] | Freq: Once | INTRAVENOUS | Status: AC
  Administered 2020-09-12: 500 [IU]
  Filled 2020-09-12: qty 5

## 2020-09-12 NOTE — Progress Notes (Signed)
[facility] OFFICE PROGRESS NOTE   Diagnosis: Colon cancer  INTERVAL HISTORY:   [name] returns as scheduled.  She completed another cycle of irinotecan/panitumumab on 08/22/2020.  She had diarrhea following chemotherapy.  The diarrhea lasted several days.  She took Imodium and Lomotil to slow the diarrhea.  She now has approximately 2 bowel movements per day, soft, while taking for immotile tablets daily.  She has cracking at the distal fingers.  [name] was seen at [facility] by Dr. Fayrene Helper and Dr.Uronis.  Dr. Josie Saunders contacted me after she was presented at their multidisciplinary conference.  He indicates [name] is not a candidate for surgery or ablation treatment.  He recommends we consider Y 90 therapy in an attempt to give her a break from systemic therapy.  Objective:  Vital signs in last 24 hours:  Blood pressure 124/80, pulse 78, temperature 97.8 F (36.6 C), temperature source Oral, resp. rate 20, height 5' 6"  (1.676 m), weight 156 lb 12.8 oz (71.1 kg), SpO2 100 %.   Resp: Lungs clear bilaterally Cardio: Regular rate and rhythm GI: No hepatosplenomegaly, nontender Vascular: No leg edema Skin: Multiple linear ulcers at the distal fingers  Portacath/PICC-without erythema  Lab Results:  Lab Results  Component Value Date   WBC 5.4 09/12/2020   HGB 11.5 (L) 09/12/2020   HCT 37.6 09/12/2020   MCV 79.3 (L) 09/12/2020   PLT 164 09/12/2020   NEUTROABS 3.9 09/12/2020    CMP  Lab Results  Component Value Date   NA 139 09/12/2020   K 4.2 09/12/2020   CL 105 09/12/2020   CO2 27 09/12/2020   GLUCOSE 176 (H) 09/12/2020   BUN 13 09/12/2020   CREATININE 0.57 09/12/2020   CALCIUM 8.9 09/12/2020   PROT 6.5 09/12/2020   ALBUMIN 4.0 09/12/2020   AST 50 (H) 09/12/2020   ALT 56 (H) 09/12/2020   ALKPHOS 118 09/12/2020   BILITOT 0.5 09/12/2020   GFRNONAA >60 09/12/2020   GFRAA >60 11/05/2019    Lab Results  Component Value Date   CEA1 43.43 (H) 06/30/2020    CEA 33.63 (H) 09/12/2020   CA125 14 11/26/2014    Medications: I have reviewed the patient's current medications.   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 The tumor returned microsatellite stable with no loss of mismatch repair protein expression   APC mutated. No BRAF, KRAS, or NRAS mutation On Foundation 1 testing   Cycle 1 adjuvant FOLFOX 09/08/2013   Cycle 2 adjuvant FOLFOX 09/24/2013   Cycle 3 adjuvant FOLFOX 10/08/2013.   Cycle 4 adjuvant FOLFOX 10/22/2013.   Cycle 5 adjuvant FOLFOX 11/05/2013. Oxaliplatin held due to thrombocytopenia. Cycle 6 FOLFOX 11/19/2013. Cycle 7 FOLFOX 12/03/2013. Oxaliplatin held secondary to thrombocytopenia. Cycle 8 FOLFOX 12/17/2013. Cycle 9 FOLFOX 01/04/2014. Oxaliplatin held secondary to neutropenia.   Cycle 10 FOLFOX 01/21/2014. Oxaliplatin held secondary to thrombocytopenia. Cycle 11 FOLFOX 02/04/2014 Cycle 12 FOLFOX 02/18/2014, oxaliplatin dose reduced secondary tothrombocytopenia CT abdomen/pelvis 01/30/2014 revealed splenomegaly and no evidence of recurrent colon cancer CT chest 04/07/2014 with a stable right lower lobe nodule and no evidence for metastatic disease, no nodules seen on the CT 11/26/2014 Markedly elevated CEA 11/24/2014 CT 11/26/2014 revealed a right pelvic mass, splenomegaly, small volume ascites Right salpingo-oophorectomy 12/28/2014 with the pathology confirming metastatic colon cancer CTs 03/23/2016-no evidence of recurrent or metastatic disease. CT 11/26/2016-enlargement of a fluid density structure the right pelvic sidewall, no other  evidence of metastatic disease CT aspiration right pelvic cyst 12/19/2016.  Cytology-BENIGN REACTIVE/REPARATIVE CHANGES. CTs 06/05/2017- no evidence of metastatic disease, mild cirrhotic changes with splenomegaly CTs 12/11/2017- recurrent cystic right adnexal mass, similar  to on the CT 11/26/2016, stable mild splenomegaly CTs 04/23/2018- enlargement of cystic right adnexal mass with mural nodularity, no other evidence of metastatic disease Cytoreductive surgery/HIPEC with mitomycin by Dr. Clovis Riley at [facility] 08/12/2018-R1 resection achieved.  Cytoreduction included omentectomy, LAR, right salpingo-oophorectomy and left colonic gutter/pelvic stripping.  Pathology on the rectum showed recurrent/metastatic adenocarcinoma, tumor 2.0 cm, predominantly involving the subserosa and muscularis propria of the colon, proximal and distal margins of resection were negative, vascular invasion present, metastatic carcinoma present in 1 out of 5 lymph nodes; omentum resection with no malignancy seen, no metastatic carcinoma identified in 1 lymph node examined; left gutter stripping positive metastatic adenocarcinoma; right ovary resection positive metastatic adenocarcinoma. 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 CEA 68 02/03/2019 Biopsy liver lesion 02/11/2019-metastatic adenocarcinoma consistent with primary colonic adenocarcinoma CTs 02/27/2019-multiple liver lesions increased in size, new lesion in the lateral right lobe of the liver.  No evidence of metastatic disease in the chest.  Redemonstrated moderate left hydronephrosis and proximal hydroureter without discrete lesion or obstructing etiology at the transition point of the mid ureter. Cycle 1 FOLFIRI/Panitumumab 03/12/2019 Cycle 2 FOLFIRI/Panitumumab 03/26/2019-bolus 5-FU and irinotecan held secondary to neutropenia Cycle 3 FOLFIRI/Panitumumab 04/09/2019, Udenyca added-not given secondary to seizure/discontinuation of the 5-FU pump CT abdomen[facility]pelvis at [facility] 05/04/2019-no residual fluid collection at the left abdominal wall abscess, stable moderate left hydronephrosis, multifocal indeterminate liver lesions--liver lesions significantly improved Cycle 4  FOLFIRI/Panitumumab 05/07/2019, Udenyca Cycle 5 FOLFIRI/Panitumumab 05/20/2019, Udenyca Cycle 6 FOLFIRI/Panitumumab 06/18/2019, Udenyca Cycle 7 FOLFIRI/Panitumumab 07/01/2019 (Irinotecan dose reduced due to thrombocytopenia), Udenyca--Udenyca was not given Cycle 8 FOLFIRI/Panitumumab 07/15/2019, Udenyca Cycle 9 FOLFIRI/Panitumumab 07/29/2019, Udenyca Cycle 10 FOLFIRI/Panitumumab 08/13/2019, Udenyca CTs at [facility] 08/19/2019-decreased size of the hypodense and hyperdense lesions within segment 2 of the left hepatic lobe.  No new lesions identified.  Similar presacral soft tissue thickening with adjacent stable alignment in the rectum.  Improved but persistent left UPJ obstruction with mild left hydronephrosis.  Persistent but decreased size of the wound within the left mid abdomen abdominal wall. Cycle 11 irinotecan/Panitumumab 08/27/2019, Udenyca Cycle 12 irinotecan/Panitumumab 10/08/2019, Udenyca Cycle 13 irinotecan/Panitumumab 11/05/2019, Udenyca CT abdomen/pelvis 11/19/2019-no new or progressive interval findings.  Stable appearance of the calcified lesion in the anterior left hepatic dome with second tiny low-density lesion in the dome of the lateral segment left liver.  Both lesions are markedly decreased since 02/27/2019.  Stable mild fullness left intrarenal collecting system and renal pelvis.  Similar appearance of abnormal soft tissue in the left paramidline subcutaneous fat with associated skin thickening, this likely correlates to the fistula. Cycle 14 irinotecan/Panitumumab 11/19/2019, Udenyca Cycle 15 irinotecan/Panitumumab 12/02/2019, Udenyca CT abdomen/pelvis at [facility] 02/11/2020- similar to slight decrease in segment 2 hypodense and hyperdense lesions (compared to a CT from July 2020), no new lesions, splenomegaly, no fluid collection or abscess, mild stranding adjacent to the incision with overlying skin thickening Cycle 16 irinotecan/Panitumumab 03/03/2020, Udenyca Cycle 17  irinotecan/Panitumumab 03/17/2020, Udenyca Cycle 18 irinotecan/panitumumab 04/07/2020, Udenyca Cycle 19 irinotecan/Panitumumab 04/28/2020, Udenyca Cycle 20 irinotecan/panitumumab 05/26/2020, Udenyca Cycle 21 irinotecan/panitumumab 06/30/2020, Udenyca CT abdomen/pelvis 07/19/2020-mildly increased hepatic steatosis.  Increased size of 2.8 cm hypervascular lesion segment 2 left hepatic lobe. Cycle 22 irinotecan/Panitumumab 07/21/2020, Udenyca MRI Abd 07/28/2020: liver lesions consistent with hepatic metastasis  Cycle 23 irinotecan/Panitumumab 08/04/2020, Udenyca  Cycle 24 irinotecan/panitumumab 08/22/2020 Irinotecan/panitumumab discontinued secondary to diarrhea and skin toxicity Mild elevation of the CEA beginning January 2016 , normal on 05/19/2014   History of iron deficiency anemia   seizure disorder; seizure 04/09/2019.  Brain CT negative.  Now on Keppra. history of depression   4 mm right lower lobe nodule on a staging chest CT 09/08/2013 , stable on a CT 04/07/2014  Hospitalization 09/24/2013 through 09/26/2013 with fever and abdominal pain.   09/24/2013 urine culture positive for coag negative staph.   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  Scope advanced for approximately 25 cm.  Most proximal portion had necrotic appearing debris.  Colostomy bag insufflated suggesting some type of communication between the pouch and the proximal colon.  Introduction of scope into the ostomy found to available directions.  1 toward the distal pouch with similar appearing necrotic debris encountered.  The other was about a 30 cm segment of normal-appearing colonic mucosa to the level of the previous right hemicolectomy ileocolonic anastomosis. 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  No malignancy.   16. COVID-19 + 09/10/2019 17. Hospitalized with seizure 09/17/2019 through 09/19/2019-MRI brain without evidence of metastatic disease. Seen by neurology. Keppra dose increased. 19.  Rash secondary to Panitumumab      Disposition: [name] appears stable.  The CEA is slightly higher.  She would like to discontinue irinotecan/panitumumab secondary to diarrhea and skin toxicity.  I discussed the case with Dr. Fayrene Helper after she was seen at [facility] on 08/29/2020.  She is not a candidate for surgery or ablation therapy.  He recommends we consider Y 90 in an attempt to stabilize the cancer while off of systemic therapy.  I discussed additional systemic treatment options with [name] including repeat treatment with FOLFOX, Lonsurf/bevacizumab, and referral for clinical trial.  We decided to discontinue the irinotecan/panitumumab.  Peripheral blood was submitted for guardant 360 testing today.  She will return for an office visit and further discussion on 09/22/2020.    Betsy Coder, MD  09/12/2020  4:38 PM

## 2020-09-13 ENCOUNTER — Telehealth: Payer: Self-pay | Admitting: *Deleted

## 2020-09-13 ENCOUNTER — Telehealth: Payer: Self-pay

## 2020-09-13 NOTE — Telephone Encounter (Signed)
Called patient to obtain more information regarding her "eye issues". Requested she call or respond via MyChart w/eye symptoms, when it started and what she has been trying for relief.

## 2020-09-13 NOTE — Telephone Encounter (Signed)
TC from Pt stating she had discussed and eye cream for the styes she has been having. Discussed with Dr. Benay Spice who stated Pt should continue warm compresses on her eyes. Relayed message to Pt. Pt. Verbalized understanding.

## 2020-09-14 ENCOUNTER — Inpatient Hospital Stay: Payer: BC Managed Care – PPO

## 2020-09-22 ENCOUNTER — Telehealth (INDEPENDENT_AMBULATORY_CARE_PROVIDER_SITE_OTHER): Payer: BC Managed Care – PPO | Admitting: Family Medicine

## 2020-09-22 ENCOUNTER — Encounter (HOSPITAL_BASED_OUTPATIENT_CLINIC_OR_DEPARTMENT_OTHER): Payer: Self-pay | Admitting: Family Medicine

## 2020-09-22 ENCOUNTER — Encounter (HOSPITAL_BASED_OUTPATIENT_CLINIC_OR_DEPARTMENT_OTHER): Payer: Self-pay

## 2020-09-22 ENCOUNTER — Other Ambulatory Visit: Payer: Self-pay

## 2020-09-22 ENCOUNTER — Inpatient Hospital Stay: Payer: BC Managed Care – PPO | Admitting: Oncology

## 2020-09-22 DIAGNOSIS — C182 Malignant neoplasm of ascending colon: Secondary | ICD-10-CM | POA: Diagnosis not present

## 2020-09-22 DIAGNOSIS — C189 Malignant neoplasm of colon, unspecified: Secondary | ICD-10-CM | POA: Diagnosis not present

## 2020-09-22 DIAGNOSIS — J0110 Acute frontal sinusitis, unspecified: Secondary | ICD-10-CM

## 2020-09-22 NOTE — Assessment & Plan Note (Signed)
Patient with recent onset of sore throat, sinus pressure, headaches.  Reports that on Monday evening she initially developed a scratchy throat.  On Tuesday she went out to eat with her family and shortly thereafter developed nausea, vomiting Tuesday night and Wednesday morning.  She has not had any further vomiting since Wednesday morning.  Symptoms of headache, sore throat, sinus pressure became more prominent on Wednesday during the day.  The symptoms have continued.  She has been using Tylenol to help with headaches, this has provided mild relief.  She has had some nasal drainage, not purulent.  Denies any recent fevers, chills, night sweats.  No diarrhea or body aches.  Has not noticed any shortness of breath or trouble breathing.  No associated abdominal pain.  She has been able to tolerate some p.o. intake yesterday and today.  Primarily has been eating crackers, drinking ginger ale. Patient with mild hoarseness on the phone, normal communication, no suggestion of increased work of breathing while talking on the phone. Suspect acute sinusitis with likely viral etiology.  Possible component of gastritis due to vomiting shortly after eating a meal at, although vomiting could be related to acute viral illness.  Also discussed possibility of COVID-19 infection as underlying etiology. Recommend continuing with symptomatic treatment, may continue with Tylenol as needed to help with symptoms, ensure adequate hydration Recommend obtaining COVID-19 test at local pharmacy if able If developing any worsening including shortness of breath, fevers, vomiting and inability to tolerate oral intake, recommend further evaluation in clinic or at local urgent care or emergency room Note to be provided for work absences from August 17 to [place] for follow-up as needed, particularly if symptoms or not improving as expected

## 2020-09-22 NOTE — Progress Notes (Deleted)
[place] OFFICE PROGRESS NOTE   Diagnosis:   INTERVAL HISTORY:   ***  Objective:  Vital signs in last 24 hours:  There were no vitals taken for this visit.    HEENT: *** Lymphatics: *** Resp: *** Cardio: *** GI: *** Vascular: *** Neuro:***  Skin:***   Portacath/PICC-without erythema  Lab Results:  Lab Results  Component Value Date   WBC 5.4 09/12/2020   HGB 11.5 (L) 09/12/2020   HCT 37.6 09/12/2020   MCV 79.3 (L) 09/12/2020   PLT 164 09/12/2020   NEUTROABS 3.9 09/12/2020    CMP  Lab Results  Component Value Date   NA 139 09/12/2020   K 4.2 09/12/2020   CL 105 09/12/2020   CO2 27 09/12/2020   GLUCOSE 176 (H) 09/12/2020   BUN 13 09/12/2020   CREATININE 0.57 09/12/2020   CALCIUM 8.9 09/12/2020   PROT 6.5 09/12/2020   ALBUMIN 4.0 09/12/2020   AST 50 (H) 09/12/2020   ALT 56 (H) 09/12/2020   ALKPHOS 118 09/12/2020   BILITOT 0.5 09/12/2020   GFRNONAA >60 09/12/2020   GFRAA >60 11/05/2019    Lab Results  Component Value Date   CEA1 43.43 (H) 06/30/2020   CEA 33.63 (H) 09/12/2020   CA125 14 11/26/2014    Lab Results  Component Value Date   INR 1.0 02/11/2019   LABPROT 12.7 02/11/2019    Imaging:  No results found.  Medications: I have reviewed the patient's current medications.   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 The tumor returned microsatellite stable with no loss of mismatch repair protein expression   APC mutated. No BRAF, KRAS, or NRAS mutation On [facility] 1 testing   Cycle 1 adjuvant FOLFOX 09/08/2013   Cycle 2 adjuvant FOLFOX 09/24/2013   Cycle 3 adjuvant FOLFOX 10/08/2013.   Cycle 4 adjuvant FOLFOX 10/22/2013.   Cycle 5 adjuvant FOLFOX 11/05/2013. Oxaliplatin held due to thrombocytopenia. Cycle 6 FOLFOX 11/19/2013. Cycle 7 FOLFOX 12/03/2013. Oxaliplatin held secondary to  thrombocytopenia. Cycle 8 FOLFOX 12/17/2013. Cycle 9 FOLFOX 01/04/2014. Oxaliplatin held secondary to neutropenia.   Cycle 10 FOLFOX 01/21/2014. Oxaliplatin held secondary to thrombocytopenia. Cycle 11 FOLFOX 02/04/2014 Cycle 12 FOLFOX 02/18/2014, oxaliplatin dose reduced secondary tothrombocytopenia CT abdomen/pelvis 01/30/2014 revealed splenomegaly and no evidence of recurrent colon cancer CT chest 04/07/2014 with a stable right lower lobe nodule and no evidence for metastatic disease, no nodules seen on the CT 11/26/2014 Markedly elevated CEA 11/24/2014 CT 11/26/2014 revealed a right pelvic mass, splenomegaly, small volume ascites Right salpingo-oophorectomy 12/28/2014 with the pathology confirming metastatic colon cancer CTs 03/23/2016-no evidence of recurrent or metastatic disease. CT 11/26/2016-enlargement of a fluid density structure the right pelvic sidewall, no other evidence of metastatic disease CT aspiration right pelvic cyst 12/19/2016.  Cytology-BENIGN REACTIVE/REPARATIVE CHANGES. CTs 06/05/2017- no evidence of metastatic disease, mild cirrhotic changes with splenomegaly CTs 12/11/2017- recurrent cystic right adnexal mass, similar to on the CT 11/26/2016, stable mild splenomegaly CTs 04/23/2018- enlargement of cystic right adnexal mass with mural nodularity, no other evidence of metastatic disease Cytoreductive surgery/HIPEC with mitomycin by Dr. Clovis Riley at [facility] 08/12/2018-R1 resection achieved.  Cytoreduction included omentectomy, LAR, right salpingo-oophorectomy and left colonic gutter/pelvic stripping.  Pathology on the rectum showed recurrent/metastatic adenocarcinoma, tumor 2.0 cm, predominantly involving the subserosa and muscularis propria of the colon, proximal and distal margins of resection were negative, vascular invasion present, metastatic carcinoma present in 1 out of 5 lymph nodes;  omentum resection with no malignancy seen, no metastatic carcinoma identified in 1 lymph node  examined; left gutter stripping positive metastatic adenocarcinoma; right ovary resection positive metastatic adenocarcinoma. 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 CEA 68 02/03/2019 Biopsy liver lesion 02/11/2019-metastatic adenocarcinoma consistent with primary colonic adenocarcinoma CTs 02/27/2019-multiple liver lesions increased in size, new lesion in the lateral right lobe of the liver.  No evidence of metastatic disease in the chest.  Redemonstrated moderate left hydronephrosis and proximal hydroureter without discrete lesion or obstructing etiology at the transition point of the mid ureter. Cycle 1 FOLFIRI/Panitumumab 03/12/2019 Cycle 2 FOLFIRI/Panitumumab 03/26/2019-bolus 5-FU and irinotecan held secondary to neutropenia Cycle 3 FOLFIRI/Panitumumab 04/09/2019, Udenyca added-not given secondary to seizure/discontinuation of the 5-FU pump CT abdomen/pelvis at [facility] 05/04/2019-no residual fluid collection at the left abdominal wall abscess, stable moderate left hydronephrosis, multifocal indeterminate liver lesions--liver lesions significantly improved Cycle 4 FOLFIRI/Panitumumab 05/07/2019, Udenyca Cycle 5 FOLFIRI/Panitumumab 05/20/2019, Udenyca Cycle 6 FOLFIRI/Panitumumab 06/18/2019, Udenyca Cycle 7 FOLFIRI/Panitumumab 07/01/2019 (Irinotecan dose reduced due to thrombocytopenia), Udenyca--Udenyca was not given Cycle 8 FOLFIRI/Panitumumab 07/15/2019, Udenyca Cycle 9 FOLFIRI/Panitumumab 07/29/2019, Udenyca Cycle 10 FOLFIRI/Panitumumab 08/13/2019, Udenyca CTs at [facility] 08/19/2019-decreased size of the hypodense and hyperdense lesions within segment 2 of the left hepatic lobe.  No new lesions identified.  Similar presacral soft tissue thickening with adjacent stable alignment in the rectum.  Improved but persistent left UPJ obstruction with mild left hydronephrosis.  Persistent but decreased size of the wound within the left mid  abdomen abdominal wall. Cycle 11 irinotecan/Panitumumab 08/27/2019, Udenyca Cycle 12 irinotecan/Panitumumab 10/08/2019, Udenyca Cycle 13 irinotecan/Panitumumab 11/05/2019, Udenyca CT abdomen/pelvis 11/19/2019-no new or progressive interval findings.  Stable appearance of the calcified lesion in the anterior left hepatic dome with second tiny low-density lesion in the dome of the lateral segment left liver.  Both lesions are markedly decreased since 02/27/2019.  Stable mild fullness left intrarenal collecting system and renal pelvis.  Similar appearance of abnormal soft tissue in the left paramidline subcutaneous fat with associated skin thickening, this likely correlates to the fistula. Cycle 14 irinotecan/Panitumumab 11/19/2019, Udenyca Cycle 15 irinotecan/Panitumumab 12/02/2019, Udenyca CT abdomen/pelvis at [facility] 02/11/2020- similar to slight decrease in segment 2 hypodense and hyperdense lesions (compared to a CT from July 2020), no new lesions, splenomegaly, no fluid collection or abscess, mild stranding adjacent to the incision with overlying skin thickening Cycle 16 irinotecan/Panitumumab 03/03/2020, Udenyca Cycle 17 irinotecan/Panitumumab 03/17/2020, Udenyca Cycle 18 irinotecan/panitumumab 04/07/2020, Udenyca Cycle 19 irinotecan/Panitumumab 04/28/2020, Udenyca Cycle 20 irinotecan/panitumumab 05/26/2020, Udenyca Cycle 21 irinotecan/panitumumab 06/30/2020, Udenyca CT abdomen/pelvis 07/19/2020-mildly increased hepatic steatosis.  Increased size of 2.8 cm hypervascular lesion segment 2 left hepatic lobe. Cycle 22 irinotecan/Panitumumab 07/21/2020, Udenyca MRI Abd 07/28/2020: liver lesions consistent with hepatic metastasis Cycle 23 irinotecan/Panitumumab 08/04/2020, Udenyca  Cycle 24 irinotecan/panitumumab 08/22/2020 Irinotecan/panitumumab discontinued secondary to diarrhea and skin toxicity Mild elevation of the CEA beginning January 2016 , normal on 05/19/2014   History of iron deficiency anemia    seizure disorder; seizure 04/09/2019.  Brain CT negative.  Now on Keppra. history of depression   4 mm right lower lobe nodule on a staging chest CT 09/08/2013 , stable on a CT 04/07/2014  Hospitalization 09/24/2013 through 09/26/2013 with fever and abdominal pain.   09/24/2013 urine culture positive for coag negative staph.   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  Scope advanced for approximately 25 cm.  Most proximal portion had necrotic appearing debris.  Colostomy bag insufflated suggesting some type of communication between the pouch and the proximal colon.  Introduction of scope into the ostomy found to available directions.  1 toward the distal pouch with similar appearing necrotic debris encountered.  The other was about a 30 cm segment of normal-appearing colonic mucosa to the level of the previous right hemicolectomy ileocolonic anastomosis. Port-A-Cath placement interventional radiology 03/05/2019 Left leg/foot weakness-brain MRI 09/03/2019 with no evidence of metastatic disease, referred to physical therapy Possible abdominal wall abscess status post evaluation by surgery at [facility] 04/03/2019, antibiotics initiated; incision and drainage with purulent material removed 04/22/2019 CT at [facility] 05/04/2019-no fluid collection at the site of the left abdominal wall abscess, "indeterminate" liver lesions CT at [facility] 06/04/2019-persistent open wound of the left abdomen, 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  No malignancy.   16. COVID-19 + 09/10/2019 17. Hospitalized with seizure 09/17/2019 through  09/19/2019-MRI brain without evidence of metastatic disease. Seen by neurology. Keppra dose increased. 19.  Rash secondary to Panitumumab      Disposition: ***  Betsy Coder, MD  09/22/2020  7:04 AM

## 2020-09-22 NOTE — Progress Notes (Signed)
    Procedures performed today:    None.  Independent interpretation of notes and tests performed by another provider:   None.  Brief History, Exam, Impression, and Recommendations:    Visit was completed virtually via audio only.  Patient identity confirmed using 2 identifiers.  Patient location: Home.  Provider location: Clinic.  There were no vitals taken for this visit.  Sinusitis Patient with recent onset of sore throat, sinus pressure, headaches.  Reports that on Monday evening she initially developed a scratchy throat.  On Tuesday she went out to eat with her family and shortly thereafter developed nausea, vomiting Tuesday night and Wednesday morning.  She has not had any further vomiting since Wednesday morning.  Symptoms of headache, sore throat, sinus pressure became more prominent on Wednesday during the day.  The symptoms have continued.  She has been using Tylenol to help with headaches, this has provided mild relief.  She has had some nasal drainage, not purulent.  Denies any recent fevers, chills, night sweats.  No diarrhea or body aches.  Has not noticed any shortness of breath or trouble breathing.  No associated abdominal pain.  She has been able to tolerate some p.o. intake yesterday and today.  Primarily has been eating crackers, drinking ginger ale. Patient with mild hoarseness on the phone, normal communication, no suggestion of increased work of breathing while talking on the phone. Suspect acute sinusitis with likely viral etiology.  Possible component of gastritis due to vomiting shortly after eating a meal at, although vomiting could be related to acute viral illness.  Also discussed possibility of COVID-19 infection as underlying etiology. Recommend continuing with symptomatic treatment, may continue with Tylenol as needed to help with symptoms, ensure adequate hydration Recommend obtaining COVID-19 test at local pharmacy if able If developing any worsening including  shortness of breath, fevers, vomiting and inability to tolerate oral intake, recommend further evaluation in clinic or at local urgent care or emergency room Note to be provided for work absences from August 17 to [place] for follow-up as needed, particularly if symptoms or not improving as expected   ___________________________________________ Grayling Schranz [name], MD, ABFM, CAQSM [facility] and [place]

## 2020-09-23 ENCOUNTER — Inpatient Hospital Stay (HOSPITAL_BASED_OUTPATIENT_CLINIC_OR_DEPARTMENT_OTHER): Payer: BC Managed Care – PPO | Admitting: Oncology

## 2020-09-23 DIAGNOSIS — C182 Malignant neoplasm of ascending colon: Secondary | ICD-10-CM

## 2020-09-23 NOTE — Progress Notes (Signed)
[name] OFFICE VISIT PROGRESS NOTE  I connected withNAME@ on 09/23/20 at  1:45 PM EDT by and verified that I am speaking with the correct person using two identifiers.   I discussed the limitations, risks, security and privacy concerns of performing an evaluation and management service by telemedicine and the availability of in-person appointments. I also discussed with the patient that there may be a patient responsible charge related to this service. The patient expressed understanding and agreed to proceed.   Patient's location: Home Provider's location: Office    Diagnosis: Colon cancer  INTERVAL HISTORY:   [name] is seen today for a video visit.  I could see/hear her on the video, but she was unable to see or hear me.  I spoke to her by telephone.  She was diagnosed with COVID-19 infection yesterday.  She reports eating at a seafood restaurant on 09/20/2020.  She developed nausea and vomiting on the evening of 09/20/2020.  This resolved the next day.  She then had a "scratchy throat ", headache, malaise, and a cough.  She was diagnosed with COVID-19.  No fever or dyspnea.  She feels better today. Diarrhea has resolved.  She ran out of Lomotil.  She has 1 bowel movement per day at present.  The skin rash and ulcerations at the digits have also improved.  She has considered treatment options that we reviewed on 09/12/2020.  She would like to proceed with Y 90 treatment.   Lab Results:  Lab Results  Component Value Date   WBC 5.4 09/12/2020   HGB 11.5 (L) 09/12/2020   HCT 37.6 09/12/2020   MCV 79.3 (L) 09/12/2020   PLT 164 09/12/2020   NEUTROABS 3.9 09/12/2020     Medications: I have reviewed the patient's current medications.  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 The tumor returned microsatellite  stable with no loss of mismatch repair protein expression   APC mutated. No BRAF, KRAS, or NRAS mutation On [facility] 1 testing   Cycle 1 adjuvant FOLFOX 09/08/2013   Cycle 2 adjuvant FOLFOX 09/24/2013   Cycle 3 adjuvant FOLFOX 10/08/2013.   Cycle 4 adjuvant FOLFOX 10/22/2013.   Cycle 5 adjuvant FOLFOX 11/05/2013. Oxaliplatin held due to thrombocytopenia. Cycle 6 FOLFOX 11/19/2013. Cycle 7 FOLFOX 12/03/2013. Oxaliplatin held secondary to thrombocytopenia. Cycle 8 FOLFOX 12/17/2013. Cycle 9 FOLFOX 01/04/2014. Oxaliplatin held secondary to neutropenia.   Cycle 10 FOLFOX 01/21/2014. Oxaliplatin held secondary to thrombocytopenia. Cycle 11 FOLFOX 02/04/2014 Cycle 12 FOLFOX 02/18/2014, oxaliplatin dose reduced secondary tothrombocytopenia CT abdomen/pelvis 01/30/2014 revealed splenomegaly and no evidence of recurrent colon cancer CT chest 04/07/2014 with a stable right lower lobe nodule and no evidence for metastatic disease, no nodules seen on the CT 11/26/2014 Markedly elevated CEA 11/24/2014 CT 11/26/2014 revealed a right pelvic mass, splenomegaly, small volume ascites Right salpingo-oophorectomy 12/28/2014 with the pathology confirming metastatic colon cancer CTs 03/23/2016-no evidence of recurrent or metastatic disease. CT 11/26/2016-enlargement of a fluid density structure the right pelvic sidewall, no other evidence of metastatic disease CT aspiration right pelvic cyst 12/19/2016.  Cytology-BENIGN REACTIVE/REPARATIVE CHANGES. CTs 06/05/2017- no evidence of metastatic disease, mild cirrhotic changes with splenomegaly CTs 12/11/2017- recurrent cystic right adnexal mass, similar to on the CT 11/26/2016, stable mild splenomegaly CTs 04/23/2018- enlargement of cystic right adnexal mass with mural nodularity, no other evidence of metastatic disease Cytoreductive surgery/HIPEC with mitomycin by Dr. Clovis Riley at [facility]  08/12/2018-R1 resection achieved.  Cytoreduction included omentectomy, LAR, right  salpingo-oophorectomy and left colonic gutter/pelvic stripping.  Pathology on the rectum showed recurrent/metastatic adenocarcinoma, tumor 2.0 cm, predominantly involving the subserosa and muscularis propria of the colon, proximal and distal margins of resection were negative, vascular invasion present, metastatic carcinoma present in 1 out of 5 lymph nodes; omentum resection with no malignancy seen, no metastatic carcinoma identified in 1 lymph node examined; left gutter stripping positive metastatic adenocarcinoma; right ovary resection positive metastatic adenocarcinoma. 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 CEA 68 02/03/2019 Biopsy liver lesion 02/11/2019-metastatic adenocarcinoma consistent with primary colonic adenocarcinoma CTs 02/27/2019-multiple liver lesions increased in size, new lesion in the lateral right lobe of the liver.  No evidence of metastatic disease in the chest.  Redemonstrated moderate left hydronephrosis and proximal hydroureter without discrete lesion or obstructing etiology at the transition point of the mid ureter. Cycle 1 FOLFIRI/Panitumumab 03/12/2019 Cycle 2 FOLFIRI/Panitumumab 03/26/2019-bolus 5-FU and irinotecan held secondary to neutropenia Cycle 3 FOLFIRI/Panitumumab 04/09/2019, Udenyca added-not given secondary to seizure/discontinuation of the 5-FU pump CT abdomen/pelvis at [facility] 05/04/2019-no residual fluid collection at the left abdominal wall abscess, stable moderate left hydronephrosis, multifocal indeterminate liver lesions--liver lesions significantly improved Cycle 4 FOLFIRI/Panitumumab 05/07/2019, Udenyca Cycle 5 FOLFIRI/Panitumumab 05/20/2019, Udenyca Cycle 6 FOLFIRI/Panitumumab 06/18/2019, Udenyca Cycle 7 FOLFIRI/Panitumumab 07/01/2019 (Irinotecan dose reduced due to thrombocytopenia), Udenyca--Udenyca was not given Cycle 8 FOLFIRI/Panitumumab 07/15/2019, Udenyca Cycle 9  FOLFIRI/Panitumumab 07/29/2019, Udenyca Cycle 10 FOLFIRI/Panitumumab 08/13/2019, Udenyca CTs at [facility] 08/19/2019-decreased size of the hypodense and hyperdense lesions within segment 2 of the left hepatic lobe.  No new lesions identified.  Similar presacral soft tissue thickening with adjacent stable alignment in the rectum.  Improved but persistent left UPJ obstruction with mild left hydronephrosis.  Persistent but decreased size of the wound within the left mid abdomen abdominal wall. Cycle 11 irinotecan/Panitumumab 08/27/2019, Udenyca Cycle 12 irinotecan/Panitumumab 10/08/2019, Udenyca Cycle 13 irinotecan/Panitumumab 11/05/2019, Udenyca CT abdomen/pelvis 11/19/2019-no new or progressive interval findings.  Stable appearance of the calcified lesion in the anterior left hepatic dome with second tiny low-density lesion in the dome of the lateral segment left liver.  Both lesions are markedly decreased since 02/27/2019.  Stable mild fullness left intrarenal collecting system and renal pelvis.  Similar appearance of abnormal soft tissue in the left paramidline subcutaneous fat with associated skin thickening, this likely correlates to the fistula. Cycle 14 irinotecan/Panitumumab 11/19/2019, Udenyca Cycle 15 irinotecan/Panitumumab 12/02/2019, Udenyca CT abdomen/pelvis at [facility] 02/11/2020- similar to slight decrease in segment 2 hypodense and hyperdense lesions (compared to a CT from July 2020), no new lesions, splenomegaly, no fluid collection or abscess, mild stranding adjacent to the incision with overlying skin thickening Cycle 16 irinotecan/Panitumumab 03/03/2020, Udenyca Cycle 17 irinotecan/Panitumumab 03/17/2020, Udenyca Cycle 18 irinotecan/panitumumab 04/07/2020, Udenyca Cycle 19 irinotecan/Panitumumab 04/28/2020, Udenyca Cycle 20 irinotecan/panitumumab 05/26/2020, Udenyca Cycle 21 irinotecan/panitumumab 06/30/2020, Udenyca CT abdomen/pelvis 07/19/2020-mildly increased hepatic steatosis.  Increased size  of 2.8 cm hypervascular lesion segment 2 left hepatic lobe. Cycle 22 irinotecan/Panitumumab 07/21/2020, Udenyca MRI Abd 07/28/2020: liver lesions consistent with hepatic metastasis Cycle 23 irinotecan/Panitumumab 08/04/2020, Udenyca  Cycle 24 irinotecan/panitumumab 08/22/2020 Irinotecan/panitumumab discontinued secondary to diarrhea and skin toxicity Guardant 360 on 09/12/2020-K-ras G12V, PIK3CA,NTRK2-VUS, tumor mutation burden 6.85, MSI-high not detected Mild elevation of the CEA beginning January 2016 , normal on 05/19/2014   History of iron deficiency anemia   seizure disorder; seizure 04/09/2019.  Brain CT negative.  Now on Keppra. history of depression  4 mm right lower lobe nodule on a staging chest CT 09/08/2013 , stable on a CT 04/07/2014  Hospitalization 09/24/2013 through 09/26/2013 with fever and abdominal pain.   09/24/2013 urine culture positive for coag negative staph.   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  Scope advanced for approximately 25 cm.  Most proximal portion had necrotic appearing debris.  Colostomy bag insufflated suggesting some type of communication between the pouch and the proximal colon.  Introduction of scope into the ostomy found to available directions.  1 toward the distal pouch with similar appearing necrotic debris encountered.  The other was about a 30 cm segment of normal-appearing colonic mucosa to the level of the previous right hemicolectomy ileocolonic anastomosis. Port-A-Cath placement interventional radiology 03/05/2019 Left leg/foot weakness-brain MRI 09/03/2019 with no evidence of metastatic disease, referred to physical therapy Possible abdominal wall abscess status post evaluation by surgery at [facility]  04/03/2019, antibiotics initiated; incision and drainage with purulent material removed 04/22/2019 CT at [facility] 05/04/2019-no fluid collection at the site of the left abdominal wall abscess, "indeterminate" liver lesions CT at [facility] 06/04/2019-persistent open wound of the left abdomen, 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  No malignancy.   16. COVID-19 + 09/10/2019 17. Hospitalized with seizure 09/17/2019 through 09/19/2019-MRI brain without evidence of metastatic disease. Seen by neurology. Keppra dose increased. 19.  Rash secondary to Panitumumab 20.  COVID-19 09/22/2020      Disposition: [name] has metastatic colon cancer.  She was most recently treated with irinotecan/panitumumab.  This was complicated by diarrhea and skin toxicity.  The CEA was higher on 09/12/2020.  She requested a treatment break.  She was evaluated by surgical oncology at [facility] and does not a candidate for a liver resection, hepatic infusion pump, or ablation.  I discussed treatment options with her on 09/12/2020.  Peripheral blood was submitted for guardant 360 testing.  This does not reveal an actionable mutation.  She indicated her preference to proceed with Y 90.  She understands the goal of Y 90 is to delay progression and provide a treatment break from systemic chemotherapy.  Y 90 is not expected to provide a survival benefit.  Ms. Lovecchio says she will consider repeat treatment with FOLFOX in the future.  I will make referral to interventional radiology for Y 90 therapy.  She will return for an office visit and Port-A-Cath flush in 3 weeks.   I discussed the assessment and treatment plan with the patient. The patient was provided an opportunity to ask questions and all were answered. The patient agreed with the plan and demonstrated an understanding of the  instructions.   The patient was advised to call back or seek an in-person evaluation if the symptoms worsen or if the condition fails to improve as anticipated.  I provided 30 minutes of video, chart review, and documentation time during this encounter, and > 50% was spent counseling as documented under my assessment & plan.  Betsy Coder ANP/GNP-BC   09/23/2020 2:18 PM

## 2020-10-04 DIAGNOSIS — C182 Malignant neoplasm of ascending colon: Secondary | ICD-10-CM | POA: Diagnosis not present

## 2020-10-06 ENCOUNTER — Other Ambulatory Visit: Payer: Self-pay

## 2020-10-06 ENCOUNTER — Encounter: Payer: Self-pay | Admitting: *Deleted

## 2020-10-06 ENCOUNTER — Other Ambulatory Visit: Payer: Self-pay | Admitting: Interventional Radiology

## 2020-10-06 ENCOUNTER — Ambulatory Visit
Admission: RE | Admit: 2020-10-06 | Discharge: 2020-10-06 | Disposition: A | Discharge status: Home / Self Care | Payer: BC Managed Care – PPO | Source: Ambulatory Visit | Attending: Oncology | Admitting: Oncology

## 2020-10-06 DIAGNOSIS — K7689 Other specified diseases of liver: Secondary | ICD-10-CM | POA: Diagnosis not present

## 2020-10-06 DIAGNOSIS — C189 Malignant neoplasm of colon, unspecified: Secondary | ICD-10-CM | POA: Diagnosis not present

## 2020-10-06 DIAGNOSIS — C182 Malignant neoplasm of ascending colon: Secondary | ICD-10-CM

## 2020-10-06 HISTORY — PX: IR RADIOLOGIST EVAL & MGMT: IMG5224

## 2020-10-06 NOTE — Consult Note (Signed)
Chief Complaint: Metastatic colon cancer  Referring Physician(s): Sherrill,Gary B  History of Present Illness: [name] is a 45 y.o. 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  The patient has undergone multiple rounds of chemotherapy, most recently treated with irinotecan/panitumumab, complicated by diarrhea and skin toxicity.  She has been evaluated by surgical oncology at [facility] but has been deemed not a candidate for liver resection, hepatic infusion pump or ablation.  The patient is currently requesting a break from chemotherapy (though states that she may consider repeat treatment with FOLFOX in the future) and has been referred for consideration of Y 90 in hopes of delaying progression and to provide a treatment break from systemic therapy.  She is seen today in telemedicine consultation.   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   Restless leg syndrome    Seizures ([place])    last seizure 08/2012    Past Surgical History:  Procedure Laterality Date   ABDOMINAL HYSTERECTOMY  08/24/2004   partial   APPENDECTOMY  08/24/2004   COLON SURGERY  08/11/2013   removed a foot of colon   COLONOSCOPY  2019   colonoscopy 10-18-14     COLONOSCOPY WITH PROPOFOL  07/07/2013   CYSTOSCOPY  11/01/2003   ENDOMETRIAL FULGURATION  11/01/2003   ESOPHAGOGASTRODUODENOSCOPY  07/07/2013   IR  IMAGING GUIDED PORT INSERTION  03/05/2019   LAPAROSCOPIC LYSIS OF ADHESIONS  11/01/2003   LAPAROSCOPIC PARTIAL COLECTOMY Right 08/11/2013   Procedure: LAPAROSCOPIC RIGHT HEMICOLECTOMY;  Surgeon: Stark Klein, MD;  Location: [place];  Service: General;  Laterality: Right;   LAPAROTOMY N/A 12/28/2014   Procedure: EXPLORATORY LAPAROTOMY;  Surgeon: Everitt Amber, MD;  Location: [facility];  Service: Gynecology;  Laterality: N/A;   LEFT OOPHORECTOMY  08/24/2004   PANNICULECTOMY  08/24/2004   PORT-A-CATH REMOVAL Left 06/08/2014   Procedure: REMOVAL PORT-A-CATH;  Surgeon: Stark Klein, MD;  Location: [place];  Service: General;  Laterality: Left;   PORTACATH PLACEMENT Left 09/09/2013   Procedure: INSERTION PORT-A-CATH;  Surgeon: Stark Klein, MD;  Location: [place];  Service: General;  Laterality: Left;   SALPINGOOPHORECTOMY Right 12/28/2014   Procedure: RIGHT SALPINGO OOPHORECTOMY;  Surgeon: Everitt Amber, MD;  Location: [facility];  Service: Gynecology;  Laterality: Right;   TUBAL LIGATION  11/07/2000   UNILATERAL SALPINGECTOMY Left 08/24/2004   WISDOM TOOTH EXTRACTION Bilateral 1994    Allergies: Adhesive [tape], Clindamycin/lincomycin, Promethazine, Eggs or egg-derived products, Metoclopramide hcl, Oxycodone-acetaminophen, Sulfa antibiotics, Levaquin [levofloxacin], and Penicillins  Medications: Prior to Admission medications   Medication Sig Start Date End Date Taking? Authorizing Provider  Accu-Chek Softclix Lancets lancets SMARTSIG:2 Topical Twice Daily 07/22/20   [provider]  acetaminophen (TYLENOL) 500 MG tablet Take 1,000 mg by mouth every 6 (six) hours as needed.    [provider]  blood glucose meter kit and  supplies Dispense based on patient and insurance preference. Use up to four times daily as directed. (FOR ICD-10 E10.9, E11.9). 07/22/20   [name], Raymond J, MD  diphenhydrAMINE (BENADRYL) 50 MG tablet Take 100 mg by mouth at bedtime as needed for itching or sleep.     [provider]  diphenoxylate-atropine (LOMOTIL) 2.5-0.025 MG tablet TAKE 1- 2 TABLETS BY MOUTH 4 TIMES DAILY AS NEEDED FOR DIARRHEA /  LOOSE  STOOLS 08/23/20   [name], MD  fluticasone (CUTIVATE) 0.05 % cream Apply topically 2 (two) times daily. To rash 07/21/20   Owens Shark, NP  hydrocortisone 2.5 % cream Apply topically 2 (two) times daily. To rash 04/07/20   [name], MD  lacosamide (VIMPAT) 200 MG TABS tablet Samples of this drug were given to the patient, quantity 3 boxes (14 tablets in each box), Lot Number#: [id number] exp:9/25 04/15/20   [name], DO  lidocaine-prilocaine (EMLA) cream Apply 1 application topically as directed. Apply to port site 1 hour prior to stick and cover with plastic wrap 10/22/19   [name], MD  loperamide (IMODIUM) 2 MG capsule Take 2 mg by mouth as needed for diarrhea or loose stools.    [provider]  LORazepam (ATIVAN) 0.5 MG tablet Take 1 tablet (0.5 mg total) by mouth 2 (two) times daily as needed. for anxiety 08/23/20   [name], MD  magnesium oxide (MAG-OX) 400 (240 Mg) MG tablet Take 1 tablet by mouth twice daily 08/16/20   Owens Shark, NP  metFORMIN (GLUCOPHAGE) 500 MG tablet Take 1 tablet by mouth twice daily with food for 7 days.  Then increase to 2 tablets with food in the morning and 1 tablet with food in the evening for 7 days.  Then increase to 2 tablets by mouth twice daily with food. 06/02/20   [name], Raymond J, MD  omeprazole (PRILOSEC) 40 MG capsule Take 40 mg by mouth as needed.    [provider]  prochlorperazine (COMPAZINE) 10 MG tablet Take 1 tablet (10 mg total) by mouth every 6 (six) hours as needed for nausea or vomiting. 12/10/19   [name], MD     Family History  Problem Relation Age of Onset   Colon polyps Mother        3+ colon polyps at each colonoscopy - "several"   Colitis Mother    Heart attack Mother    Diabetes Mother    Heart disease Mother    Diabetes  Father    Heart attack Father    Stroke Father    Congestive Heart Failure Father    Heart disease Father    Cirrhosis Maternal Aunt    Lung cancer Maternal Uncle        d. 80s; smoker and worked around asbestos   Other Paternal Grandfather        "bone cancer"   Colon cancer Maternal Uncle        dx early 65s   Breast cancer Maternal Aunt        dx 50s-60s; s/p lumpectomy   Dementia Maternal Aunt    Other Cousin        maternal 1st cousin w/ "lung issues"   Dementia Paternal Aunt    Dementia Paternal Uncle    Prostate cancer Paternal Uncle 20   Esophageal cancer Neg Hx    Rectal cancer Neg Hx    Stomach cancer Neg Hx  Social History   Socioeconomic History   Marital status: Married    Spouse name: Not on file   Number of children: 2   Years of education: Not on file   Highest education level: Not on file  Occupational History   Occupation: Unemployed- full time student    Comment: Completed 12th grade  Tobacco Use   Smoking status: Never   Smokeless tobacco: Never  Vaping Use   Vaping Use: Never used  Substance and Sexual Activity   Alcohol use: No   Drug use: No   Sexual activity: Yes    Birth control/protection: Surgical  Other Topics Concern   Not on file  Social History Narrative   Right handed    Social Determinants of Health   Financial Resource Strain: Not on file  Food Insecurity: Not on file  Transportation Needs: Not on file  Physical Activity: Not on file  Stress: Not on file  Social Connections: Not on file    ECOG Status: 1 - Symptomatic but completely ambulatory  Review of Systems  Review of Systems: A 12 point ROS discussed and pertinent positives are indicated in the HPI above.  All other systems are negative.  Physical Exam No direct physical exam was performed (except for noted visual exam findings with Video Visits).   Vital Signs: There were no vitals taken for this visit.  Imaging:  The following examinations were  reviewed in detail:  Abdominal MRI-07/08/2020 CT abdomen pelvis-07/19/2020; 11/19/2019; 12/30/2018; 08/07/2018. Ultrasound-guided liver lesion biopsy-02/11/2019 (pathology compatible with metastatic primary colonic adenocarcinoma).  Review of most recent abdominal MRI demonstrates multiple (at least 4) enhancing lesions within both the right and left lobes of the liver.  Portal vein appears patent.   Labs:  CBC: Recent Labs    08/04/20 0819 08/15/20 0920 08/22/20 1143 09/12/20 0910  WBC 7.8 8.0 6.7 5.4  HGB 11.3* 11.4* 11.8* 11.5*  HCT 37.5 36.9 38.9 37.6  PLT 143* 145* 184 164    COAGS: No results for input(s): INR, APTT in the last 8760 hours.  BMP: Recent Labs    10/08/19 1140 10/22/19 0810 11/05/19 0810 11/19/19 0926 08/09/20 1106 08/15/20 0920 08/22/20 1143 09/12/20 0910  NA 142 143 141   < > 137 140 140 139  K 4.0 4.0 4.2   < > 3.6 3.4* 3.8 4.2  CL 108 108 108   < > 102 103 104 105  CO2 26 27 27    < > 28 26 25 27   GLUCOSE 190* 122* 127*   < > 178* 221* 245* 176*  BUN 7 9 10    < > 10 7 9 13   CALCIUM 9.1 9.2 9.5   < > 9.5 9.2 8.9 8.9  CREATININE 0.76 0.62 0.72   < > 0.57 0.57 0.63 0.57  GFRNONAA >60 >60 >60   < > >60 >60 >60 >60  GFRAA >60 >60 >60  --   --   --   --   --    < > = values in this interval not displayed.    LIVER FUNCTION TESTS: Recent Labs    08/09/20 1106 08/15/20 0920 08/22/20 1143 09/12/20 0910  BILITOT 0.6 0.4 0.6 0.5  AST 36 31 48* 50*  ALT 49* 46* 56* 56*  ALKPHOS 149* 150* 111 118  PROT 6.8 6.6 6.9 6.5  ALBUMIN 4.2 4.1 4.0 4.0    TUMOR MARKERS: Recent Labs    07/21/20 0806 08/04/20 0819 08/15/20 0920 09/12/20 0910  CEA 24.61*  18.60* 25.91* 33.63*    Assessment and Plan:  [name] is a 45 y.o. 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  The patient has undergone multiple rounds of  chemotherapy, most recently treated with irinotecan/panitumumab, complicated by diarrhea and skin toxicity.  She has been evaluated by surgical oncology at [facility] but has been deemed not a candidate for liver resection, hepatic infusion pump or ablation.  The patient is currently requesting a break from chemotherapy (though states that she may consider repeat treatment with FOLFOX in the future) and has been referred for consideration of Y 90 in hopes of delaying progression and to provide a treatment break from systemic therapy.   The following examinations were reviewed in detail:  Abdominal MRI-07/08/2020 CT abdomen pelvis-07/19/2020; 11/19/2019; 12/30/2018; 08/07/2018. Ultrasound-guided liver lesion biopsy-02/11/2019 (pathology compatible with metastatic primary colonic adenocarcinoma).  Review of most recent abdominal MRI demonstrates multiple (at least 4) enhancing lesions within both the right and left lobes of the liver.  The portal vein appears patent. The patient's bilirubin remains normal (0.5 by 09/12/2020).  Based on the above, I feel the patient is an excellent candidate for bilobar Y 90 radioembolization.  Benefits and risks (including but not limited to bleeding, vessel injury, nontarget embolization, worsening hepatic function and contrast/radiation exposure) of Y 90 radioembolization was discussed in detail with the patient.  I explained that the procedure is performed for palliative purposes in hopes of achieving her desired result of disease stability to slight improvement for a length of time while she can experience a break from systemic therapy.  Following this prolonged and detailed conversation, the patient wishes to proceed with Y 81 radioembolization.  As such, we will first proceed with obtaining a staging CT of the chest, abdomen and pelvis with hepatic protocol of the abdomen to evaluate arterial anatomy prior to the mapping embolization.  All Y 90 radioembolization procedures  will be performed at [facility] on an outpatient basis.  The patient demonstrated excellent understanding of the above conversation and is agreement with the proposed plan of care.  She knows to call the interventional radiology clinic with any future questions or concerns.  Plan: - Obtain staging/planning CT of the chest, abdomen and pelvis with hepatic protocol of the abdomen. - Obtain insurance approval and proceed with scheduling the initial pre-Y 90/mapping radioembolization.  Thank you for this interesting consult.  I greatly enjoyed meeting [name] and look forward to participating in their care.  A copy of this report was sent to the requesting provider on this date.  Electronically Signed: Sandi Mariscal 10/06/2020, 10:41 AM   I spent a total of 30 Minutes in remote  clinical consultation, greater than 50% of which was counseling/coordinating care for metastatic colon cancer.    Visit type: Audio only (telephone). Audio (no video) only due to patient's lack of internet/smartphone capability. Alternative for in-person consultation at [facility], [address] [place], [place], [place]. This visit type was conducted due to national recommendations for restrictions regarding the COVID-19 Pandemic (e.g. social distancing).  This format is felt to be most appropriate for this patient at this time.  All issues noted in this document were discussed and addressed.

## 2020-10-11 ENCOUNTER — Other Ambulatory Visit: Payer: Self-pay

## 2020-10-11 ENCOUNTER — Encounter (HOSPITAL_COMMUNITY): Payer: Self-pay

## 2020-10-11 ENCOUNTER — Ambulatory Visit (HOSPITAL_COMMUNITY)
Admission: RE | Admit: 2020-10-11 | Discharge: 2020-10-11 | Disposition: A | Discharge status: Home / Self Care | Payer: BC Managed Care – PPO | Source: Ambulatory Visit | Attending: Interventional Radiology | Admitting: Interventional Radiology

## 2020-10-11 DIAGNOSIS — N1330 Unspecified hydronephrosis: Secondary | ICD-10-CM | POA: Diagnosis not present

## 2020-10-11 DIAGNOSIS — C189 Malignant neoplasm of colon, unspecified: Secondary | ICD-10-CM | POA: Insufficient documentation

## 2020-10-11 DIAGNOSIS — K6389 Other specified diseases of intestine: Secondary | ICD-10-CM | POA: Diagnosis not present

## 2020-10-11 DIAGNOSIS — I2510 Atherosclerotic heart disease of native coronary artery without angina pectoris: Secondary | ICD-10-CM | POA: Diagnosis not present

## 2020-10-11 DIAGNOSIS — C787 Secondary malignant neoplasm of liver and intrahepatic bile duct: Secondary | ICD-10-CM | POA: Insufficient documentation

## 2020-10-11 DIAGNOSIS — I700 Atherosclerosis of aorta: Secondary | ICD-10-CM | POA: Diagnosis not present

## 2020-10-11 MED ORDER — HEPARIN SOD (PORK) LOCK FLUSH 100 UNIT/ML IV SOLN
500.0000 [IU] | Freq: Once | INTRAVENOUS | Status: AC
  Administered 2020-10-11: 500 [IU] via INTRAVENOUS

## 2020-10-11 MED ORDER — IOHEXOL 350 MG/ML SOLN
100.0000 mL | Freq: Once | INTRAVENOUS | Status: AC | PRN
  Administered 2020-10-11: 100 mL via INTRAVENOUS

## 2020-10-11 MED ORDER — HEPARIN SOD (PORK) LOCK FLUSH 100 UNIT/ML IV SOLN
INTRAVENOUS | Status: AC
  Filled 2020-10-11: qty 5

## 2020-10-14 ENCOUNTER — Other Ambulatory Visit (HOSPITAL_COMMUNITY): Payer: Self-pay | Admitting: Interventional Radiology

## 2020-10-14 ENCOUNTER — Encounter: Payer: Self-pay | Admitting: *Deleted

## 2020-10-14 DIAGNOSIS — C189 Malignant neoplasm of colon, unspecified: Secondary | ICD-10-CM

## 2020-10-15 ENCOUNTER — Other Ambulatory Visit: Payer: Self-pay | Admitting: Nurse Practitioner

## 2020-10-15 ENCOUNTER — Other Ambulatory Visit: Payer: Self-pay | Admitting: Neurology

## 2020-10-15 DIAGNOSIS — C182 Malignant neoplasm of ascending colon: Secondary | ICD-10-CM

## 2020-10-15 DIAGNOSIS — C787 Secondary malignant neoplasm of liver and intrahepatic bile duct: Secondary | ICD-10-CM

## 2020-10-17 ENCOUNTER — Inpatient Hospital Stay: Payer: BC Managed Care – PPO | Attending: Oncology

## 2020-10-17 ENCOUNTER — Encounter: Payer: Self-pay | Admitting: Nurse Practitioner

## 2020-10-17 ENCOUNTER — Other Ambulatory Visit: Payer: Self-pay

## 2020-10-17 ENCOUNTER — Inpatient Hospital Stay: Payer: BC Managed Care – PPO

## 2020-10-17 ENCOUNTER — Inpatient Hospital Stay (HOSPITAL_BASED_OUTPATIENT_CLINIC_OR_DEPARTMENT_OTHER): Payer: BC Managed Care – PPO | Admitting: Nurse Practitioner

## 2020-10-17 VITALS — BP 147/91 | HR 93 | Temp 98.1°F | Resp 18 | Ht 66.0 in | Wt 161.4 lb

## 2020-10-17 DIAGNOSIS — Z8616 Personal history of COVID-19: Secondary | ICD-10-CM | POA: Insufficient documentation

## 2020-10-17 DIAGNOSIS — Z9049 Acquired absence of other specified parts of digestive tract: Secondary | ICD-10-CM | POA: Insufficient documentation

## 2020-10-17 DIAGNOSIS — R21 Rash and other nonspecific skin eruption: Secondary | ICD-10-CM | POA: Insufficient documentation

## 2020-10-17 DIAGNOSIS — T451X5A Adverse effect of antineoplastic and immunosuppressive drugs, initial encounter: Secondary | ICD-10-CM | POA: Diagnosis not present

## 2020-10-17 DIAGNOSIS — R918 Other nonspecific abnormal finding of lung field: Secondary | ICD-10-CM | POA: Insufficient documentation

## 2020-10-17 DIAGNOSIS — C7961 Secondary malignant neoplasm of right ovary: Secondary | ICD-10-CM | POA: Insufficient documentation

## 2020-10-17 DIAGNOSIS — G40909 Epilepsy, unspecified, not intractable, without status epilepticus: Secondary | ICD-10-CM | POA: Insufficient documentation

## 2020-10-17 DIAGNOSIS — G620 Drug-induced polyneuropathy: Secondary | ICD-10-CM | POA: Diagnosis not present

## 2020-10-17 DIAGNOSIS — C787 Secondary malignant neoplasm of liver and intrahepatic bile duct: Secondary | ICD-10-CM | POA: Diagnosis not present

## 2020-10-17 DIAGNOSIS — C182 Malignant neoplasm of ascending colon: Secondary | ICD-10-CM | POA: Insufficient documentation

## 2020-10-17 DIAGNOSIS — Z79899 Other long term (current) drug therapy: Secondary | ICD-10-CM | POA: Insufficient documentation

## 2020-10-17 DIAGNOSIS — Z933 Colostomy status: Secondary | ICD-10-CM | POA: Diagnosis not present

## 2020-10-17 DIAGNOSIS — G40309 Generalized idiopathic epilepsy and epileptic syndromes, not intractable, without status epilepticus: Secondary | ICD-10-CM

## 2020-10-17 DIAGNOSIS — R197 Diarrhea, unspecified: Secondary | ICD-10-CM | POA: Insufficient documentation

## 2020-10-17 DIAGNOSIS — N1330 Unspecified hydronephrosis: Secondary | ICD-10-CM | POA: Diagnosis not present

## 2020-10-17 DIAGNOSIS — R162 Hepatomegaly with splenomegaly, not elsewhere classified: Secondary | ICD-10-CM | POA: Diagnosis not present

## 2020-10-17 DIAGNOSIS — K760 Fatty (change of) liver, not elsewhere classified: Secondary | ICD-10-CM | POA: Diagnosis not present

## 2020-10-17 DIAGNOSIS — R970 Elevated carcinoembryonic antigen [CEA]: Secondary | ICD-10-CM | POA: Diagnosis not present

## 2020-10-17 DIAGNOSIS — Z95828 Presence of other vascular implants and grafts: Secondary | ICD-10-CM

## 2020-10-17 LAB — CBC WITH DIFFERENTIAL (CANCER CENTER ONLY)
Abs Immature Granulocytes: 0.01 10*3/uL (ref 0.00–0.07)
Basophils Absolute: 0 10*3/uL (ref 0.0–0.1)
Basophils Relative: 0 %
Eosinophils Absolute: 0.1 10*3/uL (ref 0.0–0.5)
Eosinophils Relative: 4 %
HCT: 39.2 % (ref 36.0–46.0)
Hemoglobin: 11.7 g/dL — ABNORMAL LOW (ref 12.0–15.0)
Immature Granulocytes: 0 %
Lymphocytes Relative: 25 %
Lymphs Abs: 0.8 10*3/uL (ref 0.7–4.0)
MCH: 22.5 pg — ABNORMAL LOW (ref 26.0–34.0)
MCHC: 29.8 g/dL — ABNORMAL LOW (ref 30.0–36.0)
MCV: 75.2 fL — ABNORMAL LOW (ref 80.0–100.0)
Monocytes Absolute: 0.2 10*3/uL (ref 0.1–1.0)
Monocytes Relative: 7 %
Neutro Abs: 2 10*3/uL (ref 1.7–7.7)
Neutrophils Relative %: 64 %
Platelet Count: 167 10*3/uL (ref 150–400)
RBC: 5.21 MIL/uL — ABNORMAL HIGH (ref 3.87–5.11)
RDW: 14.3 % (ref 11.5–15.5)
WBC Count: 3.1 10*3/uL — ABNORMAL LOW (ref 4.0–10.5)
nRBC: 0 % (ref 0.0–0.2)

## 2020-10-17 LAB — CMP (CANCER CENTER ONLY)
ALT: 40 U/L (ref 0–44)
AST: 35 U/L (ref 15–41)
Albumin: 4.2 g/dL (ref 3.5–5.0)
Alkaline Phosphatase: 136 U/L — ABNORMAL HIGH (ref 38–126)
Anion gap: 8 (ref 5–15)
BUN: 17 mg/dL (ref 6–20)
CO2: 27 mmol/L (ref 22–32)
Calcium: 9.6 mg/dL (ref 8.9–10.3)
Chloride: 106 mmol/L (ref 98–111)
Creatinine: 0.86 mg/dL (ref 0.44–1.00)
GFR, Estimated: >60 mL/min (ref >60)
Glucose, Bld: 239 mg/dL — ABNORMAL HIGH (ref 70–99)
Potassium: 4.3 mmol/L (ref 3.5–5.1)
Sodium: 141 mmol/L (ref 135–145)
Total Bilirubin: 0.6 mg/dL (ref 0.3–1.2)
Total Protein: 7.3 g/dL (ref 6.5–8.1)

## 2020-10-17 LAB — MAGNESIUM: Magnesium: 1.9 mg/dL (ref 1.7–2.4)

## 2020-10-17 LAB — CEA (ACCESS): CEA (CHCC): 117.27 ng/mL — ABNORMAL HIGH (ref 0.00–5.00)

## 2020-10-17 MED ORDER — MAGNESIUM OXIDE -MG SUPPLEMENT 400 (240 MG) MG PO TABS: 1.0000 | ORAL_TABLET | Freq: Two times a day (BID) | ORAL | 1 refills | Status: DC

## 2020-10-17 MED ORDER — HEPARIN SOD (PORK) LOCK FLUSH 100 UNIT/ML IV SOLN
500.0000 [IU] | Freq: Once | INTRAVENOUS | Status: AC
  Administered 2020-10-17: 500 [IU]

## 2020-10-17 MED ORDER — SODIUM CHLORIDE 0.9% FLUSH
10.0000 mL | Freq: Once | INTRAVENOUS | Status: AC
  Administered 2020-10-17: 10 mL

## 2020-10-17 NOTE — Progress Notes (Signed)
[name] OFFICE PROGRESS NOTE   Diagnosis: Colon cancer  INTERVAL HISTORY:   [name] returns as scheduled.  She is on a treatment break.  She saw Dr. Pascal Lux, Interventional Radiology, 10/06/2020, for consideration of Y 90 therapy.  She is felt to be a good candidate for bilobar Y 90 radioembolization.  She feels well.  No nausea or vomiting.  No diarrhea.  Skin rash is better.  She denies pain.  She has a good appetite.  Objective:  Vital signs in last 24 hours:  Blood pressure (!) 147/91, pulse 93, temperature 98.1 F (36.7 C), temperature source Oral, resp. rate 18, height 5' 6"  (1.676 m), weight 161 lb 6.4 oz (73.2 kg), SpO2 100 %.    HEENT: No thrush or ulcers. Resp: Lungs clear bilaterally. Cardio: Regular rate and rhythm. GI: Abdomen soft and nontender.  No hepatomegaly.  No mass. Vascular: No leg edema. Skin: No significant rash. Port-A-Cath without erythema   Lab Results:  Lab Results  Component Value Date   WBC 5.4 09/12/2020   HGB 11.5 (L) 09/12/2020   HCT 37.6 09/12/2020   MCV 79.3 (L) 09/12/2020   PLT 164 09/12/2020   NEUTROABS 3.9 09/12/2020    Imaging:  No results found.  Medications: I have reviewed the patient's current medications.  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 The tumor returned microsatellite stable with no loss of mismatch repair protein expression   APC mutated. No BRAF, KRAS, or NRAS mutation On Foundation 1 testing   Cycle 1 adjuvant FOLFOX 09/08/2013   Cycle 2 adjuvant FOLFOX 09/24/2013   Cycle 3 adjuvant FOLFOX 10/08/2013.   Cycle 4 adjuvant FOLFOX 10/22/2013.   Cycle 5 adjuvant FOLFOX 11/05/2013. Oxaliplatin held due to thrombocytopenia. Cycle 6 FOLFOX 11/19/2013. Cycle 7 FOLFOX 12/03/2013. Oxaliplatin held secondary to thrombocytopenia. Cycle 8 FOLFOX 12/17/2013. Cycle 9 FOLFOX  01/04/2014. Oxaliplatin held secondary to neutropenia.   Cycle 10 FOLFOX 01/21/2014. Oxaliplatin held secondary to thrombocytopenia. Cycle 11 FOLFOX 02/04/2014 Cycle 12 FOLFOX 02/18/2014, oxaliplatin dose reduced secondary tothrombocytopenia CT abdomen/pelvis 01/30/2014 revealed splenomegaly and no evidence of recurrent colon cancer CT chest 04/07/2014 with a stable right lower lobe nodule and no evidence for metastatic disease, no nodules seen on the CT 11/26/2014 Markedly elevated CEA 11/24/2014 CT 11/26/2014 revealed a right pelvic mass, splenomegaly, small volume ascites Right salpingo-oophorectomy 12/28/2014 with the pathology confirming metastatic colon cancer CTs 03/23/2016-no evidence of recurrent or metastatic disease. CT 11/26/2016-enlargement of a fluid density structure the right pelvic sidewall, no other evidence of metastatic disease CT aspiration right pelvic cyst 12/19/2016.  Cytology-BENIGN REACTIVE/REPARATIVE CHANGES. CTs 06/05/2017- no evidence of metastatic disease, mild cirrhotic changes with splenomegaly CTs 12/11/2017- recurrent cystic right adnexal mass, similar to on the CT 11/26/2016, stable mild splenomegaly CTs 04/23/2018- enlargement of cystic right adnexal mass with mural nodularity, no other evidence of metastatic disease Cytoreductive surgery/HIPEC with mitomycin by Dr. Clovis Riley at [facility] 08/12/2018-R1 resection achieved.  Cytoreduction included omentectomy, LAR, right salpingo-oophorectomy and left colonic gutter/pelvic stripping.  Pathology on the rectum showed recurrent/metastatic adenocarcinoma, tumor 2.0 cm, predominantly involving the subserosa and muscularis propria of the colon, proximal and distal margins of resection were negative, vascular invasion present, metastatic carcinoma present in 1 out of 5 lymph nodes; omentum resection with no malignancy seen, no metastatic carcinoma identified in 1 lymph node examined; left gutter stripping positive metastatic  adenocarcinoma; right ovary resection positive metastatic adenocarcinoma. 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 CEA 68 02/03/2019 Biopsy liver lesion 02/11/2019-metastatic adenocarcinoma consistent with primary colonic adenocarcinoma CTs 02/27/2019-multiple liver lesions increased in size, new lesion in the lateral right lobe of the liver.  No evidence of metastatic disease in the chest.  Redemonstrated moderate left hydronephrosis and proximal hydroureter without discrete lesion or obstructing etiology at the transition point of the mid ureter. Cycle 1 FOLFIRI/Panitumumab 03/12/2019 Cycle 2 FOLFIRI/Panitumumab 03/26/2019-bolus 5-FU and irinotecan held secondary to neutropenia Cycle 3 FOLFIRI/Panitumumab 04/09/2019, Udenyca added-not given secondary to seizure/discontinuation of the 5-FU pump CT abdomen/pelvis at [facility] 05/04/2019-no residual fluid collection at the left abdominal wall abscess, stable moderate left hydronephrosis, multifocal indeterminate liver lesions--liver lesions significantly improved Cycle 4 FOLFIRI/Panitumumab 05/07/2019, Udenyca Cycle 5 FOLFIRI/Panitumumab 05/20/2019, Udenyca Cycle 6 FOLFIRI/Panitumumab 06/18/2019, Udenyca Cycle 7 FOLFIRI/Panitumumab 07/01/2019 (Irinotecan dose reduced due to thrombocytopenia), Udenyca--Udenyca was not given Cycle 8 FOLFIRI/Panitumumab 07/15/2019, Udenyca Cycle 9 FOLFIRI/Panitumumab 07/29/2019, Udenyca Cycle 10 FOLFIRI/Panitumumab 08/13/2019, Udenyca CTs at [facility] 08/19/2019-decreased size of the hypodense and hyperdense lesions within segment 2 of the left hepatic lobe.  No new lesions identified.  Similar presacral soft tissue thickening with adjacent stable alignment in the rectum.  Improved but persistent left UPJ obstruction with mild left hydronephrosis.  Persistent but decreased size of the wound within the left mid abdomen abdominal wall. Cycle 11  irinotecan/Panitumumab 08/27/2019, Udenyca Cycle 12 irinotecan/Panitumumab 10/08/2019, Udenyca Cycle 13 irinotecan/Panitumumab 11/05/2019, Udenyca CT abdomen/pelvis 11/19/2019-no new or progressive interval findings.  Stable appearance of the calcified lesion in the anterior left hepatic dome with second tiny low-density lesion in the dome of the lateral segment left liver.  Both lesions are markedly decreased since 02/27/2019.  Stable mild fullness left intrarenal collecting system and renal pelvis.  Similar appearance of abnormal soft tissue in the left paramidline subcutaneous fat with associated skin thickening, this likely correlates to the fistula. Cycle 14 irinotecan/Panitumumab 11/19/2019, Udenyca Cycle 15 irinotecan/Panitumumab 12/02/2019, Udenyca CT abdomen/pelvis at [facility] 02/11/2020- similar to slight decrease in segment 2 hypodense and hyperdense lesions (compared to a CT from July 2020), no new lesions, splenomegaly, no fluid collection or abscess, mild stranding adjacent to the incision with overlying skin thickening Cycle 16 irinotecan/Panitumumab 03/03/2020, Udenyca Cycle 17 irinotecan/Panitumumab 03/17/2020, Udenyca Cycle 18 irinotecan/panitumumab 04/07/2020, Udenyca Cycle 19 irinotecan/Panitumumab 04/28/2020, Udenyca Cycle 20 irinotecan/panitumumab 05/26/2020, Udenyca Cycle 21 irinotecan/panitumumab 06/30/2020, Udenyca CT abdomen/pelvis 07/19/2020-mildly increased hepatic steatosis.  Increased size of 2.8 cm hypervascular lesion segment 2 left hepatic lobe. Cycle 22 irinotecan/Panitumumab 07/21/2020, Udenyca MRI Abd 07/28/2020: liver lesions consistent with hepatic metastasis Cycle 23 irinotecan/Panitumumab 08/04/2020, Udenyca  Cycle 24 irinotecan/panitumumab 08/22/2020 Irinotecan/panitumumab discontinued secondary to diarrhea and skin toxicity Guardant 360 on 09/12/2020-K-ras G12V, PIK3CA,NTRK2-VUS, tumor mutation burden 6.85, MSI-high not detected Referred for consideration of Y 90 CTs  10/11/2020-for enlarging hepatic metastatic lesions identified.  No new lesions.  Several tiny nodules in the lungs in the 2 to 3 mm diameter range.  Mild splenomegaly.  Mild left hydronephrosis. Mild elevation of the CEA beginning January 2016 , normal on 05/19/2014   History of iron deficiency anemia   seizure disorder; seizure 04/09/2019.  Brain CT negative.  Now on Keppra. history of depression   4 mm right lower lobe nodule on a staging chest CT 09/08/2013 , stable on a CT 04/07/2014  Hospitalization 09/24/2013 through 09/26/2013 with fever and abdominal pain.   09/24/2013 urine culture positive for coag negative staph.   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  Scope advanced for approximately 25 cm.  Most proximal portion had necrotic appearing debris.  Colostomy bag insufflated suggesting some type of communication between the pouch and the proximal colon.  Introduction of scope into the ostomy found to available directions.  1 toward the distal pouch with similar appearing necrotic debris encountered.  The other was about a 30 cm segment of normal-appearing colonic mucosa to the level of the previous right hemicolectomy ileocolonic anastomosis. 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  No malignancy.   16. COVID-19 + 09/10/2019 17. Hospitalized with seizure 09/17/2019 through 09/19/2019-MRI brain without evidence of metastatic disease. Seen by neurology. Keppra dose increased. 19.  Rash secondary to Panitumumab 20.  COVID-19 09/22/2020    Disposition: [name] appears stable.  Her clinical status has improved since taking a treatment break.  The plan is to proceed with Y 90 radioembolization.    She will return for follow-up 11/24/2020.  We are available to see her sooner if needed.   Ned Card ANP/GNP-BC   10/17/2020  1:02 PM

## 2020-10-17 NOTE — Progress Notes (Signed)
Per Dr.Jaffe pt to come by and have B12 and ferratin level checked.

## 2020-10-17 NOTE — Patient Instructions (Signed)
Implanted Port Home Guide An implanted port is a device that is placed under the skin. It is usually placed in the chest. The device can be used to give IV medicine, to take blood, or for dialysis. You may have an implanted port if: You need IV medicine that would be irritating to the small veins in your hands or arms. You need IV medicines, such as antibiotics, for a long period of time. You need IV nutrition for a long period of time. You need dialysis. When you have a port, your health care provider can choose to use the port instead of veins in your arms for these procedures. You may have fewer limitations when using a port than you would if you used other types of long-term IVs, and you will likely be able to return to normal activities after your incision heals. An implanted port has two main parts: Reservoir. The reservoir is the part where a needle is inserted to give medicines or draw blood. The reservoir is round. After it is placed, it appears as a small, raised area under your skin. Catheter. The catheter is a thin, flexible tube that connects the reservoir to a vein. Medicine that is inserted into the reservoir goes into the catheter and then into the vein. How is my port accessed? To access your port: A numbing cream may be placed on the skin over the port site. Your health care provider will put on a mask and sterile gloves. The skin over your port will be cleaned carefully with a germ-killing soap and allowed to dry. Your health care provider will gently pinch the port and insert a needle into it. Your health care provider will check for a blood return to make sure the port is in the vein and is not clogged. If your port needs to remain accessed to get medicine continuously (constant infusion), your health care provider will place a clear bandage (dressing) over the needle site. The dressing and needle will need to be changed every week, or as told by your health care provider. What  is flushing? Flushing helps keep the port from getting clogged. Follow instructions from your health care provider about how and when to flush the port. Ports are usually flushed with saline solution or a medicine called heparin. The need for flushing will depend on how the port is used: If the port is only used from time to time to give medicines or draw blood, the port may need to be flushed: Before and after medicines have been given. Before and after blood has been drawn. As part of routine maintenance. Flushing may be recommended every 4-6 weeks. If a constant infusion is running, the port may not need to be flushed. Throw away any syringes in a disposal container that is meant for sharp items (sharps container). You can buy a sharps container from a pharmacy, or you can make one by using an empty hard plastic bottle with a cover. How long will my port stay implanted? The port can stay in for as long as your health care provider thinks it is needed. When it is time for the port to come out, a surgery will be done to remove it. The surgery will be similar to the procedure that was done to put the port in. Follow these instructions at home:  Flush your port as told by your health care provider. If you need an infusion over several days, follow instructions from your health care provider about how   to take care of your port site. Make sure you: Wash your hands with soap and water before you change your dressing. If soap and water are not available, use alcohol-based hand sanitizer. Change your dressing as told by your health care provider. Place any used dressings or infusion bags into a plastic bag. Throw that bag in the trash. Keep the dressing that covers the needle clean and dry. Do not get it wet. Do not use scissors or sharp objects near the tube. Keep the tube clamped, unless it is being used. Check your port site every day for signs of infection. Check for: Redness, swelling, or  pain. Fluid or blood. Pus or a bad smell. Protect the skin around the port site. Avoid wearing bra straps that rub or irritate the site. Protect the skin around your port from seat belts. Place a soft pad over your chest if needed. Bathe or shower as told by your health care provider. The site may get wet as long as you are not actively receiving an infusion. Return to your normal activities as told by your health care provider. Ask your health care provider what activities are safe for you. Carry a medical alert card or wear a medical alert bracelet at all times. This will let health care providers know that you have an implanted port in case of an emergency. Get help right away if: You have redness, swelling, or pain at the port site. You have fluid or blood coming from your port site. You have pus or a bad smell coming from the port site. You have a fever. Summary Implanted ports are usually placed in the chest for long-term IV access. Follow instructions from your health care provider about flushing the port and changing bandages (dressings). Take care of the area around your port by avoiding clothing that puts pressure on the area, and by watching for signs of infection. Protect the skin around your port from seat belts. Place a soft pad over your chest if needed. Get help right away if you have a fever or you have redness, swelling, pain, drainage, or a bad smell at the port site. This information is not intended to replace advice given to you by your health care provider. Make sure you discuss any questions you have with your health care provider. Document Revised: 04/13/2020 Document Reviewed: 06/08/2019 Elsevier Patient Education  2022 [employer]  

## 2020-10-18 ENCOUNTER — Telehealth: Payer: Self-pay

## 2020-10-18 ENCOUNTER — Other Ambulatory Visit (INDEPENDENT_AMBULATORY_CARE_PROVIDER_SITE_OTHER): Payer: BC Managed Care – PPO

## 2020-10-18 DIAGNOSIS — G40309 Generalized idiopathic epilepsy and epileptic syndromes, not intractable, without status epilepticus: Secondary | ICD-10-CM | POA: Diagnosis not present

## 2020-10-18 LAB — B12 AND FOLATE PANEL
Folate: 9.7 ng/mL (ref >5.9)
Vitamin B-12: 146 pg/mL — ABNORMAL LOW (ref 211–911)

## 2020-10-18 LAB — FERRITIN: Ferritin: 14 ng/mL (ref 10.0–291.0)

## 2020-10-18 NOTE — Telephone Encounter (Signed)
-----   Message from [name], DO sent at 10/18/2020  3:03 PM EDT ----- B12 level is low.  That can contribute to restless leg.  Recommend starting B12 1041mg injections - 1 injection daily for 7 days, then 1 injection weekly for 4 weeks, then 1 injection monthly for 10 months.  We can prescribe it so she can do it herself at home (she can come in the office and we can show her) or she can have it done in our office, or she can have it done at her PCP's office if they are okay doing it.  Also, I had also ordered a ferritin level which appears to not have been done.  I would like to check that as well ----- Message ----- From: Interface, [facility] Sent: 10/18/2020  12:58 PM EDT To: APieter Partridge DO

## 2020-10-18 NOTE — Telephone Encounter (Signed)
Tried calling pt no answer,   Ferratin level added spoke to penny. She will call the lab to see if they have enough sample to send off.

## 2020-10-18 NOTE — Telephone Encounter (Signed)
Pt is returning call to sheena.

## 2020-10-19 ENCOUNTER — Other Ambulatory Visit: Payer: Self-pay

## 2020-10-19 DIAGNOSIS — G2581 Restless legs syndrome: Secondary | ICD-10-CM

## 2020-10-19 NOTE — Progress Notes (Signed)
Ferritin level is on the low side which can aggravate restless leg.  Start ferrous sulfate '325mg'$  daily.  Recheck level within a week before follow up on 12/30.    Ferratin level ordered.

## 2020-10-19 NOTE — Telephone Encounter (Signed)
Pt advised through Mychart.

## 2020-10-20 ENCOUNTER — Ambulatory Visit: Payer: BC Managed Care – PPO | Admitting: Neurology

## 2020-10-31 ENCOUNTER — Other Ambulatory Visit: Payer: Self-pay | Admitting: Oncology

## 2020-10-31 DIAGNOSIS — C182 Malignant neoplasm of ascending colon: Secondary | ICD-10-CM

## 2020-11-02 ENCOUNTER — Other Ambulatory Visit: Payer: Self-pay | Admitting: Physician Assistant

## 2020-11-03 ENCOUNTER — Ambulatory Visit (HOSPITAL_COMMUNITY)
Admission: RE | Admit: 2020-11-03 | Discharge: 2020-11-03 | Disposition: A | Discharge status: Home / Self Care | Payer: BC Managed Care – PPO | Source: Ambulatory Visit | Attending: Interventional Radiology | Admitting: Interventional Radiology

## 2020-11-03 ENCOUNTER — Other Ambulatory Visit: Payer: Self-pay

## 2020-11-03 ENCOUNTER — Encounter (HOSPITAL_COMMUNITY)
Admission: RE | Admit: 2020-11-03 | Discharge: 2020-11-03 | Disposition: A | Discharge status: Home / Self Care | Payer: BC Managed Care – PPO | Source: Ambulatory Visit | Attending: Interventional Radiology | Admitting: Interventional Radiology

## 2020-11-03 ENCOUNTER — Other Ambulatory Visit (HOSPITAL_COMMUNITY): Payer: Self-pay | Admitting: Interventional Radiology

## 2020-11-03 ENCOUNTER — Encounter (HOSPITAL_COMMUNITY): Payer: Self-pay

## 2020-11-03 DIAGNOSIS — Z91048 Other nonmedicinal substance allergy status: Secondary | ICD-10-CM | POA: Insufficient documentation

## 2020-11-03 DIAGNOSIS — C787 Secondary malignant neoplasm of liver and intrahepatic bile duct: Secondary | ICD-10-CM | POA: Insufficient documentation

## 2020-11-03 DIAGNOSIS — Z7984 Long term (current) use of oral hypoglycemic drugs: Secondary | ICD-10-CM | POA: Diagnosis not present

## 2020-11-03 DIAGNOSIS — Z880 Allergy status to penicillin: Secondary | ICD-10-CM | POA: Insufficient documentation

## 2020-11-03 DIAGNOSIS — C189 Malignant neoplasm of colon, unspecified: Secondary | ICD-10-CM

## 2020-11-03 DIAGNOSIS — Z882 Allergy status to sulfonamides status: Secondary | ICD-10-CM | POA: Insufficient documentation

## 2020-11-03 DIAGNOSIS — Z91012 Allergy to eggs: Secondary | ICD-10-CM | POA: Insufficient documentation

## 2020-11-03 DIAGNOSIS — Z79899 Other long term (current) drug therapy: Secondary | ICD-10-CM | POA: Insufficient documentation

## 2020-11-03 DIAGNOSIS — Z885 Allergy status to narcotic agent status: Secondary | ICD-10-CM | POA: Insufficient documentation

## 2020-11-03 DIAGNOSIS — C19 Malignant neoplasm of rectosigmoid junction: Secondary | ICD-10-CM | POA: Diagnosis not present

## 2020-11-03 DIAGNOSIS — Z881 Allergy status to other antibiotic agents status: Secondary | ICD-10-CM | POA: Diagnosis not present

## 2020-11-03 HISTORY — PX: IR ANGIOGRAM VISCERAL SELECTIVE: IMG657

## 2020-11-03 HISTORY — PX: IR ANGIOGRAM SELECTIVE EACH ADDITIONAL VESSEL: IMG667

## 2020-11-03 HISTORY — PX: IR US GUIDE VASC ACCESS RIGHT: IMG2390

## 2020-11-03 HISTORY — PX: IR EMBO ARTERIAL NOT HEMORR HEMANG INC GUIDE ROADMAPPING: IMG5448

## 2020-11-03 LAB — GLUCOSE, CAPILLARY
Glucose-Capillary: 328 mg/dL — ABNORMAL HIGH (ref 70–99)
Glucose-Capillary: 286 mg/dL — ABNORMAL HIGH (ref 70–99)
Glucose-Capillary: 218 mg/dL — ABNORMAL HIGH (ref 70–99)

## 2020-11-03 LAB — CBC WITH DIFFERENTIAL/PLATELET
Abs Immature Granulocytes: 0.01 10*3/uL (ref 0.00–0.07)
Basophils Absolute: 0 10*3/uL (ref 0.0–0.1)
Basophils Relative: 0 %
Eosinophils Absolute: 0.1 10*3/uL (ref 0.0–0.5)
Eosinophils Relative: 2 %
HCT: 39.4 % (ref 36.0–46.0)
Hemoglobin: 12.1 g/dL (ref 12.0–15.0)
Immature Granulocytes: 0 %
Lymphocytes Relative: 22 %
Lymphs Abs: 0.7 10*3/uL (ref 0.7–4.0)
MCH: 23.9 pg — ABNORMAL LOW (ref 26.0–34.0)
MCHC: 30.7 g/dL (ref 30.0–36.0)
MCV: 77.9 fL — ABNORMAL LOW (ref 80.0–100.0)
Monocytes Absolute: 0.2 10*3/uL (ref 0.1–1.0)
Monocytes Relative: 7 %
Neutro Abs: 2.2 10*3/uL (ref 1.7–7.7)
Neutrophils Relative %: 69 %
Platelets: 144 10*3/uL — ABNORMAL LOW (ref 150–400)
RBC: 5.06 MIL/uL (ref 3.87–5.11)
RDW: 15.9 % — ABNORMAL HIGH (ref 11.5–15.5)
WBC: 3.2 10*3/uL — ABNORMAL LOW (ref 4.0–10.5)
nRBC: 0 % (ref 0.0–0.2)

## 2020-11-03 LAB — COMPREHENSIVE METABOLIC PANEL
ALT: 89 U/L — ABNORMAL HIGH (ref 0–44)
AST: 65 U/L — ABNORMAL HIGH (ref 15–41)
Albumin: 3.6 g/dL (ref 3.5–5.0)
Alkaline Phosphatase: 138 U/L — ABNORMAL HIGH (ref 38–126)
Anion gap: 5 (ref 5–15)
BUN: 16 mg/dL (ref 6–20)
CO2: 25 mmol/L (ref 22–32)
Calcium: 9.4 mg/dL (ref 8.9–10.3)
Chloride: 105 mmol/L (ref 98–111)
Creatinine, Ser: 0.78 mg/dL (ref 0.44–1.00)
GFR, Estimated: >60 mL/min (ref >60)
Glucose, Bld: 333 mg/dL — ABNORMAL HIGH (ref 70–99)
Potassium: 4 mmol/L (ref 3.5–5.1)
Sodium: 135 mmol/L (ref 135–145)
Total Bilirubin: 0.6 mg/dL (ref 0.3–1.2)
Total Protein: 7.2 g/dL (ref 6.5–8.1)

## 2020-11-03 LAB — PROTIME-INR
INR: 1.1 (ref 0.8–1.2)
Prothrombin Time: 13.8 seconds (ref 11.4–15.2)

## 2020-11-03 MED ORDER — MIDAZOLAM HCL 2 MG/2ML IJ SOLN
INTRAMUSCULAR | Status: AC
  Filled 2020-11-03: qty 4

## 2020-11-03 MED ORDER — FENTANYL CITRATE (PF) 100 MCG/2ML IJ SOLN
INTRAMUSCULAR | Status: AC
  Filled 2020-11-03: qty 2

## 2020-11-03 MED ORDER — TECHNETIUM TO 99M ALBUMIN AGGREGATED
4.1000 | Freq: Once | INTRAVENOUS | Status: AC
  Administered 2020-11-03: 4.1 via INTRAVENOUS

## 2020-11-03 MED ORDER — IOHEXOL 300 MG/ML  SOLN
150.0000 mL | Freq: Once | INTRAMUSCULAR | Status: AC | PRN
  Administered 2020-11-03: 25 mL via INTRA_ARTERIAL

## 2020-11-03 MED ORDER — LIDOCAINE-EPINEPHRINE 1 %-1:100000 IJ SOLN
INTRAMUSCULAR | Status: DC | PRN
  Administered 2020-11-03: 10 mL

## 2020-11-03 MED ORDER — HEPARIN SOD (PORK) LOCK FLUSH 100 UNIT/ML IV SOLN
500.0000 [IU] | INTRAVENOUS | Status: AC | PRN
  Administered 2020-11-03: 500 [IU]
  Filled 2020-11-03: qty 5

## 2020-11-03 MED ORDER — IOHEXOL 300 MG/ML  SOLN
100.0000 mL | Freq: Once | INTRAMUSCULAR | Status: AC | PRN
  Administered 2020-11-03: 35 mL via INTRA_ARTERIAL

## 2020-11-03 MED ORDER — MIDAZOLAM HCL 2 MG/2ML IJ SOLN
INTRAMUSCULAR | Status: DC | PRN
  Administered 2020-11-03 (×2): 1 mg via INTRAVENOUS

## 2020-11-03 MED ORDER — FENTANYL CITRATE (PF) 100 MCG/2ML IJ SOLN
INTRAMUSCULAR | Status: DC | PRN
  Administered 2020-11-03 (×2): 50 ug via INTRAVENOUS

## 2020-11-03 MED ORDER — LIDOCAINE HCL 1 % IJ SOLN
INTRAMUSCULAR | Status: AC
  Filled 2020-11-03: qty 20

## 2020-11-03 MED ORDER — SODIUM CHLORIDE 0.9 % IV SOLN: INTRAVENOUS | Status: DC

## 2020-11-03 MED ORDER — DIPHENHYDRAMINE HCL 50 MG/ML IJ SOLN
INTRAMUSCULAR | Status: AC
  Filled 2020-11-03: qty 1

## 2020-11-03 MED ORDER — DIPHENHYDRAMINE HCL 50 MG/ML IJ SOLN
INTRAMUSCULAR | Status: DC | PRN
  Administered 2020-11-03: 25 mg via INTRAVENOUS

## 2020-11-03 NOTE — Discharge Instructions (Signed)
Please call Interventional Radiology clinic [phone] with any questions or concerns.  You may remove your dressing and shower tomorrow.    Hepatic Artery Radioembolization, Care After The following information offers guidance on how to care for yourself after your procedure. Your health care provider may also give you more specific instructions. If you have problems or questions, contact your health care provider. What can I expect after the procedure? After the procedure, it is possible to have: A slight fever for 7 to 10 days. This may be accompanied by pain, nausea, or vomiting, which is referred to as post-embolization syndrome. You may be given medicine to help relieve these symptoms. If your fever gets worse, tell your health care provider. Tiredness (fatigue). Loss of appetite. This should gradually improve after about 1 week. Abdominal pain on your right side. Soreness and tenderness in your groin area where the needle and catheter were placed (puncture site). Follow these instructions at home: Puncture site care Follow instructions from your health care provider about how to take care of the puncture site. Make sure you: Wash your hands with soap and water for at least 20 seconds before and after you change your bandage (dressing). If soap and water are not available, use hand sanitizer. Change your dressing as told by your health care provider. Check your puncture site every day for signs of infection. Check for: More redness, swelling, or pain. Fluid or blood. Warmth. Pus or a bad smell. Activity Rest as told by your health care provider. Return to your normal activities as told by your health care provider. Ask your health care provider what activities are safe for you. Avoid sitting for a long time without moving. Get up to take short walks every 1-2 hours. This is important to improve blood flow and breathing. Ask for help if you feel weak or unsteady. If you were given  a sedative during the procedure, it can affect you for several hours. Do not drive or operate machinery until your health care provider says that it is safe. Do not lift anything that is heavier than 10 lb (4.5 kg), or the limit that you are told, until your health care provider says that it is safe. Medicines Take over-the-counter and prescription medicines only as told by your health care provider. Ask your health care provider if the medicine prescribed to you: Requires you to avoid driving or using machinery. Can cause constipation. You may need to take these actions to prevent or treat constipation: Drink enough fluid to keep your urine pale yellow. Take over-the-counter or prescription medicines. Eat foods that are high in fiber, such as beans, whole grains, and fresh fruits and vegetables. Limit foods that are high in fat and processed sugars, such as fried or sweet foods. General instructions Eat frequent, small meals until your appetite returns. Follow instructions from your health care provider about eating or drinking restrictions. Do not take baths, swim, or use a hot tub until your health care provider approves. You may take showers. Wash your puncture site with mild soap and water, and pat the area dry. Wear compression stockings as told by your health care provider. These stockings help to prevent blood clots and reduce swelling in your legs. Keep all follow-up visits. This is important. You may need to have blood tests and imaging tests. Contact a health care provider if: You have any of these signs of infection: More redness, swelling, or pain around your puncture site. Fluid or blood coming from your  coming from your puncture site. Warmth coming from your puncture site. Pus or a bad smell coming from your puncture site. You have pain that: Gets worse. Does not get better with medicine. Feels like very bad heartburn. Is in the middle of your abdomen, above your belly button. You have  any signs of infection or liver failure, such as: Your skin or the white parts of your eyes turn yellow (jaundice). The color of your urine changes to dark [place]. The color of your stool (feces) changes to light yellow. Your abdominal measurement (girth) increases in a short period of time. You gain more than 5 lb (2.3 kg) in a short period of time. Get help right away if: You have a fever that lasts more than 10 days or is higher than what your health care provider told you to expect. You develop any of the following in your legs: Pain. Swelling. Skin that is cold or pale or turns blue. You have chest pain. You have blood in your vomit, saliva, or stool. You have trouble breathing. These symptoms may represent a serious problem that is an emergency. Do not wait to see if the symptoms will go away. Get medical help right away. Call your local emergency services (911 in the [place] Do not drive yourself to the hospital. Summary After the procedure, it is possible to have a slight fever for up to 7-10 days, tiredness, loss of appetite, abdominal pain on the right side, and groin tenderness where the catheter was placed. Do not come in close contact with people for up to a week after your procedure, as told by your health care provider. Follow instructions from your health care provider about how to take care of the puncture site. Contact a health care provider if you have any signs of infection. Get help right away if you develop pain or swelling in your legs or if your legs feel cool or look pale. This information is not intended to replace advice given to you by your health care provider. Make sure you discuss any questions you have with your health care provider. Document Revised: 12/27/2019 Document Reviewed: 12/27/2019 Elsevier Patient Education  2022 [employer] Inc[employer]      Moderate Conscious Sedation, Adult, Care After This sheet gives you information about how to care for yourself  after your procedure. Your health care provider may also give you more specific instructions. If you have problems or questions, contact your health care provider. What can I expect after the procedure? After the procedure, it is common to have: Sleepiness for several hours. Impaired judgment for several hours. Difficulty with balance. Vomiting if you eat too soon. Follow these instructions at home: For the time period you were told by your health care provider: Rest. Do not participate in activities where you could fall or become injured. Do not drive or use machinery. Do not drink alcohol. Do not take sleeping pills or medicines that cause drowsiness. Do not make important decisions or sign legal documents. Do not take care of children on your own.      Eating and drinking Follow the diet recommended by your health care provider. Drink enough fluid to keep your urine pale yellow. If you vomit: Drink water, juice, or soup when you can drink without vomiting. Make sure you have little or no nausea before eating solid foods.   General instructions Take over-the-counter and prescription medicines only as told by your health care provider. Have a responsible adult stay with you   you are told. It is important to have someone help care for you until you are awake and alert. Do not smoke. Keep all follow-up visits as told by your health care provider. This is important. Contact a health care provider if: You are still sleepy or having trouble with balance after 24 hours. You feel light-headed. You keep feeling nauseous or you keep vomiting. You develop a rash. You have a fever. You have redness or swelling around the IV site. Get help right away if: You have trouble breathing. You have new-onset confusion at home. Summary After the procedure, it is common to feel sleepy, have impaired judgment, or feel nauseous if you eat too soon. Rest after you get home. Know the things you  should not do after the procedure. Follow the diet recommended by your health care provider and drink enough fluid to keep your urine pale yellow. Get help right away if you have trouble breathing or new-onset confusion at home. This information is not intended to replace advice given to you by your health care provider. Make sure you discuss any questions you have with your health care provider. Document Revised: 05/22/2019 Document Reviewed: 12/18/2018 Elsevier Patient Education  2021 [place] YOUR NEXT PROCEDURE  Radiation precautions For up to a week after your procedure, there will be a small amount of radioactivity near your liver. This is not especially dangerous to other people. However, as told by your health care provider, you should follow these precautions for 7 days: Do not come in close contact with people. Do not sleep in the same bed as someone else. Do not hold children or babies. Do not have contact with pregnant women.  Post Y-90 Radioembolization Discharge Instructions  You have been given a radioactive material during your procedure.  While it is safe for you to be discharged home from the hospital, you need to proceed directly home.    Do not use public transportation, including air travel, lasting more than 2 hours for 1 week.  Avoid crowded public places for 1 week.  Adult visitors should try to avoid close contact with you for 1 week.    Children and pregnant females should not visit or have close contact with you for 1 week.  Items that you touch are not radioactive.  Do not sleep in the same bed as your partner for 1 week, and a condom should be used for sexual activity during the first 24 hours.  Your blood may be radioactive and caution should be used if any bleeding occurs during the recovery period.  Body fluids may be radioactive for 24 hours.  Wash your hands after voiding.  Men should sit to urinate.  Dispose of any soiled  materials (flush down toilet or place in trash at home) during the first day.  Drink 6 to 8 glasses of fluids per day for 5 days to hydrate yourself.  If you need to see a doctor during the first week, you must let them know that you were treated with yttrium-90 microspheres, and will be slightly radioactive.  They can call Interventional Radiology [phone] with any questions.

## 2020-11-03 NOTE — H&P (Signed)
  Referring Physician(s): Sherrill,B  Supervising Physician: Watts, John  Patient Status:  [facility]  Chief Complaint: Metastatic colon cancer to liver   Subjective: Patient familiar to IR service from right pelvic cyst aspiration 2018, left liver lesion biopsy in 2021 and Port-A-Cath placement in 2021. She has a history of metastatic colon cancer involving the ascending colon, status post laparoscopic right colectomy on 08/11/2013.  She has undergone multiple rounds of chemotherapy, most recently treated with irinotecan/panitumumab, complicated by diarrhea and skin toxicity.  She has been evaluated by surgical oncology at [facility] but has been deemed not a candidate for liver resection, hepatic infusion pump or ablation.  She underwent consultation with Dr. Watts on 10/06/2020 to discuss treatment options. She was deemed an appropriate candidate for Y 90 hepatic radioembolization and presents today for arterial roadmapping study, embolization and test Y 90 dosing.  She currently denies fever, headache, chest pain, dyspnea, cough, abdominal/back pain, nausea, vomiting or bleeding.  Additional history as below.  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   Restless leg syndrome    Seizures (HCC)    last seizure 08/2012   Past Surgical History:  Procedure Laterality Date   ABDOMINAL HYSTERECTOMY  08/24/2004   partial   APPENDECTOMY  08/24/2004   COLON SURGERY  08/11/2013   removed a foot of colon   COLONOSCOPY  2019   colonoscopy 10-18-14     COLONOSCOPY WITH PROPOFOL  07/07/2013   CYSTOSCOPY  11/01/2003   ENDOMETRIAL  FULGURATION  11/01/2003   ESOPHAGOGASTRODUODENOSCOPY  07/07/2013   IR IMAGING GUIDED PORT INSERTION  03/05/2019   IR RADIOLOGIST EVAL & MGMT  10/06/2020   LAPAROSCOPIC LYSIS OF ADHESIONS  11/01/2003   LAPAROSCOPIC PARTIAL COLECTOMY Right 08/11/2013   Procedure: LAPAROSCOPIC RIGHT HEMICOLECTOMY;  Surgeon: Faera Byerly, MD;  Location[facility];  Service: General;  Laterality: Right;   LAPAROTOMY N/A 12/28/2014   Procedure: EXPLORATORY LAPAROTOMY;  Surgeon: Emma Rossi, MD;  [facility];  Service: Gynecology;  Laterality: N/A;   LEFT OOPHORECTOMY  08/24/2004   PANNICULECTOMY  08/24/2004   PORT-A-CATH REMOVAL Left 06/08/2014   Procedure: REMOVAL PORT-A-CATH;  Surgeon: Faera Byerly, MD;  Location: [facility];  Service: General;  Laterality: Left;   PORTACATH PLACEMENT Left 09/09/2013   Procedure: INSERTION PORT-A-CATH;  Surgeon: Faera Byerly, MD;  Location: [facility];  Service: General;  Laterality: Left;   SALPINGOOPHORECTOMY Right 12/28/2014   Procedure: RIGHT SALPINGO OOPHORECTOMY;  Surgeon: Emma Rossi, MD;  [facility];  Service: Gynecology;  Laterality: Right;   TUBAL LIGATION  11/07/2000   UNILATERAL SALPINGECTOMY Left 08/24/2004   WISDOM TOOTH EXTRACTION Bilateral 1994      Allergies: Adhesive [tape], Clindamycin/lincomycin, Promethazine, Eggs or egg-derived products, Metoclopramide hcl, Oxycodone-acetaminophen, Sulfa antibiotics, Levaquin [levofloxacin], and Penicillins  Medications: Prior to Admission medications   Medication Sig Start Date End Date Taking? Authorizing Provider  Accu-Chek Softclix Lancets lancets SMARTSIG:2 Topical Twice Daily 07/22/20  Yes [provider]  acetaminophen (TYLENOL) 500 MG tablet Take 1,000 mg by mouth every   6 (six) hours as needed.   Yes [provider]  blood glucose meter kit and supplies Dispense based on patient and insurance preference. Use up to four times daily as directed. (FOR ICD-10 E10.9, E11.9). 07/22/20  Yes [place],  Raymond J, MD  diphenhydrAMINE (BENADRYL) 50 MG tablet Take 100 mg by mouth at bedtime as needed for itching or sleep.   Yes [provider]  lacosamide (VIMPAT) 200 MG TABS tablet Take 1 tablet by mouth twice daily 10/17/20  Yes Jaffe, Adam R, DO  LORazepam (ATIVAN) 0.5 MG tablet Take 1 tablet by mouth twice daily as needed for anxiety 11/01/20  Yes Sherrill, Gary B, MD  magnesium oxide (MAG-OX) 400 (240 Mg) MG tablet Take 1 tablet (400 mg total) by mouth 2 (two) times daily. 10/17/20  Yes Thomas, Lisa K, NP  metFORMIN (GLUCOPHAGE) 500 MG tablet Take 1 tablet by mouth twice daily with food for 7 days.  Then increase to 2 tablets with food in the morning and 1 tablet with food in the evening for 7 days.  Then increase to 2 tablets by mouth twice daily with food. 06/02/20  Yes [place], Raymond J, MD  omeprazole (PRILOSEC) 40 MG capsule Take 40 mg by mouth as needed.   Yes [provider]  prochlorperazine (COMPAZINE) 10 MG tablet Take 1 tablet (10 mg total) by mouth every 6 (six) hours as needed for nausea or vomiting. 12/10/19  Yes Sherrill, Gary B, MD  diphenoxylate-atropine (LOMOTIL) 2.5-0.025 MG tablet TAKE 1- 2 TABLETS BY MOUTH 4 TIMES DAILY AS NEEDED FOR DIARRHEA /  LOOSE  STOOLS 08/23/20   Sherrill, Gary B, MD  fluticasone (CUTIVATE) 0.05 % cream Apply topically 2 (two) times daily. To rash 07/21/20   Thomas, Lisa K, NP  hydrocortisone 2.5 % cream Apply topically 2 (two) times daily. To rash 04/07/20   Sherrill, Gary B, MD  lidocaine-prilocaine (EMLA) cream Apply 1 application topically as directed. Apply to port site 1 hour prior to stick and cover with plastic wrap 10/22/19   Sherrill, Gary B, MD  loperamide (IMODIUM) 2 MG capsule Take 2 mg by mouth as needed for diarrhea or loose stools.    [provider]     Vital Signs: BP 137/82   Pulse 95   Temp 99 F (37.2 C) (Oral)   Resp 18   SpO2 99%   Physical Exam awake, alert.  Chest clear to auscultation bilaterally.   Clean, intact left chest wall Port-A-Cath.  Heart with regular rate and rhythm.  Abdomen soft, positive bowel sounds, nontender.  No lower extremity edema.  Imaging: No results found.  Labs:  CBC: Recent Labs    08/22/20 1143 09/12/20 0910 10/17/20 1331 11/03/20 0810  WBC 6.7 5.4 3.1* 3.2*  HGB 11.8* 11.5* 11.7* 12.1  HCT 38.9 37.6 39.2 39.4  PLT 184 164 167 144*    COAGS: No results for input(s): INR, APTT in the last 8760 hours.  BMP: Recent Labs    11/05/19 0810 11/19/19 0926 08/15/20 0920 08/22/20 1143 09/12/20 0910 10/17/20 1331  NA 141   < > 140 140 139 141  K 4.2   < > 3.4* 3.8 4.2 4.3  CL 108   < > 103 104 105 106  CO2 27   < > 26 25 27 27  GLUCOSE 127*   < > 221* 245* 176* 239*  BUN 10   < > 7 9 13 17  CALCIUM 9.5   < >   9.2 8.9 8.9 9.6  CREATININE 0.72   < > 0.57 0.63 0.57 0.86  GFRNONAA >60   < > >60 >60 >60 >60  GFRAA >60  --   --   --   --   --    < > = values in this interval not displayed.    LIVER FUNCTION TESTS: Recent Labs    08/15/20 0920 08/22/20 1143 09/12/20 0910 10/17/20 1331  BILITOT 0.4 0.6 0.5 0.6  AST 31 48* 50* 35  ALT 46* 56* 56* 40  ALKPHOS 150* 111 118 136*  PROT 6.6 6.9 6.5 7.3  ALBUMIN 4.1 4.0 4.0 4.2    Assessment and Plan: Patient familiar to IR service from right pelvic cyst aspiration 2018, left liver lesion biopsy in 2021 and Port-A-Cath placement in 2021. She has a history of metastatic colon cancer involving the ascending colon, status post laparoscopic right colectomy on 08/11/2013.  She has undergone multiple rounds of chemotherapy, most recently treated with irinotecan/panitumumab, complicated by diarrhea and skin toxicity.  She has been evaluated by surgical oncology at [facility] but has been deemed not a candidate for liver resection, hepatic infusion pump or ablation.  She underwent consultation with Dr. Watts on 10/06/2020 to discuss treatment options. She was deemed an appropriate candidate for Y 90 hepatic  radioembolization and presents today for arterial roadmapping study, embolization and test Y 90 dosing.Risks and benefits of procedure were discussed with the patient including, but not limited to bleeding, infection, vascular injury or contrast induced renal failure.  This interventional procedure involves the use of X-rays and because of the nature of the planned procedure, it is possible that we will have prolonged use of X-ray fluoroscopy.  Potential radiation risks to you include (but are not limited to) the following: - A slightly elevated risk for cancer  several years later in life. This risk is typically less than 0.5% percent. This risk is low in comparison to the normal incidence of human cancer, which is 33% for women and 50% for men according to the [employer]. - Radiation induced injury can include skin redness, resembling a rash, tissue breakdown / ulcers and hair loss (which can be temporary or permanent).   The likelihood of either of these occurring depends on the difficulty of the procedure and whether you are sensitive to radiation due to previous procedures, disease, or genetic conditions.   IF your procedure requires a prolonged use of radiation, you will be notified and given written instructions for further action.  It is your responsibility to monitor the irradiated area for the 2 weeks following the procedure and to notify your physician if you are concerned that you have suffered a radiation induced injury.    All of the patient's questions were answered, patient is agreeable to proceed.  Consent signed and in chart.      Electronically Signed: D. Kevin , PA-C 11/03/2020, 8:36 AM   I spent a total of 25 minutes at the the patient's bedside AND on the patient's hospital floor or unit, greater than 50% of which was counseling/coordinating care for visceral/hepatic arteriogram with embolization, test Y 90 dosing      

## 2020-11-03 NOTE — Procedures (Signed)
Pre-procedure Diagnosis: Metastatic colon cancer Post-procedure Diagnosis: Same  Post mesenteric arteriogram with mapping radioembolization.    Complications: None Immediate  EBL: None  Keep right leg straight for 4 hrs.    Signed: Sandi Mariscal Pager: [phone] 11/03/2020, 1:28 PM

## 2020-11-03 NOTE — Sedation Documentation (Signed)
To nuc med for study 

## 2020-11-15 ENCOUNTER — Other Ambulatory Visit: Payer: Self-pay | Admitting: Radiology

## 2020-11-16 ENCOUNTER — Other Ambulatory Visit: Payer: Self-pay | Admitting: Neurology

## 2020-11-16 ENCOUNTER — Other Ambulatory Visit (HOSPITAL_COMMUNITY): Payer: Self-pay | Admitting: Physician Assistant

## 2020-11-16 ENCOUNTER — Other Ambulatory Visit: Payer: Self-pay | Admitting: Radiology

## 2020-11-17 ENCOUNTER — Other Ambulatory Visit (HOSPITAL_COMMUNITY): Payer: Self-pay | Admitting: Interventional Radiology

## 2020-11-17 ENCOUNTER — Ambulatory Visit (HOSPITAL_COMMUNITY)
Admission: RE | Admit: 2020-11-17 | Discharge: 2020-11-17 | Disposition: A | Discharge status: Home / Self Care | Payer: BC Managed Care – PPO | Source: Ambulatory Visit | Attending: Interventional Radiology | Admitting: Interventional Radiology

## 2020-11-17 ENCOUNTER — Encounter (HOSPITAL_COMMUNITY)
Admission: RE | Admit: 2020-11-17 | Discharge: 2020-11-17 | Disposition: A | Discharge status: Home / Self Care | Payer: BC Managed Care – PPO | Source: Ambulatory Visit | Attending: Interventional Radiology | Admitting: Interventional Radiology

## 2020-11-17 DIAGNOSIS — C189 Malignant neoplasm of colon, unspecified: Secondary | ICD-10-CM | POA: Insufficient documentation

## 2020-11-17 DIAGNOSIS — C787 Secondary malignant neoplasm of liver and intrahepatic bile duct: Secondary | ICD-10-CM | POA: Diagnosis not present

## 2020-11-17 DIAGNOSIS — Z882 Allergy status to sulfonamides status: Secondary | ICD-10-CM | POA: Diagnosis not present

## 2020-11-17 DIAGNOSIS — Z885 Allergy status to narcotic agent status: Secondary | ICD-10-CM | POA: Insufficient documentation

## 2020-11-17 DIAGNOSIS — Z880 Allergy status to penicillin: Secondary | ICD-10-CM | POA: Diagnosis not present

## 2020-11-17 DIAGNOSIS — Z7984 Long term (current) use of oral hypoglycemic drugs: Secondary | ICD-10-CM | POA: Diagnosis not present

## 2020-11-17 DIAGNOSIS — Z91048 Other nonmedicinal substance allergy status: Secondary | ICD-10-CM | POA: Insufficient documentation

## 2020-11-17 DIAGNOSIS — Z79899 Other long term (current) drug therapy: Secondary | ICD-10-CM | POA: Insufficient documentation

## 2020-11-17 DIAGNOSIS — Z881 Allergy status to other antibiotic agents status: Secondary | ICD-10-CM | POA: Insufficient documentation

## 2020-11-17 DIAGNOSIS — Z91012 Allergy to eggs: Secondary | ICD-10-CM | POA: Insufficient documentation

## 2020-11-17 HISTORY — PX: IR ANGIOGRAM SELECTIVE EACH ADDITIONAL VESSEL: IMG667

## 2020-11-17 HISTORY — PX: IR ANGIOGRAM VISCERAL SELECTIVE: IMG657

## 2020-11-17 HISTORY — PX: IR EMBO TUMOR ORGAN ISCHEMIA INFARCT INC GUIDE ROADMAPPING: IMG5449

## 2020-11-17 HISTORY — PX: IR US GUIDE VASC ACCESS RIGHT: IMG2390

## 2020-11-17 LAB — CBC WITH DIFFERENTIAL/PLATELET
Abs Immature Granulocytes: 0.01 10*3/uL (ref 0.00–0.07)
Basophils Absolute: 0 10*3/uL (ref 0.0–0.1)
Basophils Relative: 0 %
Eosinophils Absolute: 0.1 10*3/uL (ref 0.0–0.5)
Eosinophils Relative: 3 %
HCT: 39.3 % (ref 36.0–46.0)
Hemoglobin: 12.2 g/dL (ref 12.0–15.0)
Immature Granulocytes: 0 %
Lymphocytes Relative: 24 %
Lymphs Abs: 0.8 10*3/uL (ref 0.7–4.0)
MCH: 24 pg — ABNORMAL LOW (ref 26.0–34.0)
MCHC: 31 g/dL (ref 30.0–36.0)
MCV: 77.2 fL — ABNORMAL LOW (ref 80.0–100.0)
Monocytes Absolute: 0.3 10*3/uL (ref 0.1–1.0)
Monocytes Relative: 7 %
Neutro Abs: 2.2 10*3/uL (ref 1.7–7.7)
Neutrophils Relative %: 66 %
Platelets: 124 10*3/uL — ABNORMAL LOW (ref 150–400)
RBC: 5.09 MIL/uL (ref 3.87–5.11)
RDW: 15.5 % (ref 11.5–15.5)
WBC: 3.4 10*3/uL — ABNORMAL LOW (ref 4.0–10.5)
nRBC: 0 % (ref 0.0–0.2)

## 2020-11-17 LAB — COMPREHENSIVE METABOLIC PANEL
ALT: 68 U/L — ABNORMAL HIGH (ref 0–44)
AST: 49 U/L — ABNORMAL HIGH (ref 15–41)
Albumin: 3.8 g/dL (ref 3.5–5.0)
Alkaline Phosphatase: 194 U/L — ABNORMAL HIGH (ref 38–126)
Anion gap: 8 (ref 5–15)
BUN: 18 mg/dL (ref 6–20)
CO2: 24 mmol/L (ref 22–32)
Calcium: 9.1 mg/dL (ref 8.9–10.3)
Chloride: 103 mmol/L (ref 98–111)
Creatinine, Ser: 0.62 mg/dL (ref 0.44–1.00)
GFR, Estimated: >60 mL/min (ref >60)
Glucose, Bld: 311 mg/dL — ABNORMAL HIGH (ref 70–99)
Potassium: 4 mmol/L (ref 3.5–5.1)
Sodium: 135 mmol/L (ref 135–145)
Total Bilirubin: 0.8 mg/dL (ref 0.3–1.2)
Total Protein: 7.2 g/dL (ref 6.5–8.1)

## 2020-11-17 LAB — PROTIME-INR
INR: 1.1 (ref 0.8–1.2)
Prothrombin Time: 13.9 seconds (ref 11.4–15.2)

## 2020-11-17 LAB — GLUCOSE, CAPILLARY
Glucose-Capillary: 278 mg/dL — ABNORMAL HIGH (ref 70–99)
Glucose-Capillary: 309 mg/dL — ABNORMAL HIGH (ref 70–99)

## 2020-11-17 MED ORDER — LIDOCAINE HCL 1 % IJ SOLN
INTRAMUSCULAR | Status: AC
  Filled 2020-11-17: qty 20

## 2020-11-17 MED ORDER — FENTANYL CITRATE (PF) 100 MCG/2ML IJ SOLN
INTRAMUSCULAR | Status: DC | PRN
  Administered 2020-11-17: 50 ug via INTRAVENOUS

## 2020-11-17 MED ORDER — LIDOCAINE HCL (PF) 1 % IJ SOLN
INTRAMUSCULAR | Status: DC | PRN
  Administered 2020-11-17: 5 mL via INTRADERMAL

## 2020-11-17 MED ORDER — MIDAZOLAM HCL 2 MG/2ML IJ SOLN
INTRAMUSCULAR | Status: AC
  Filled 2020-11-17: qty 2

## 2020-11-17 MED ORDER — DIPHENHYDRAMINE HCL 50 MG/ML IJ SOLN
INTRAMUSCULAR | Status: DC | PRN
  Administered 2020-11-17: 25 mg via INTRAVENOUS

## 2020-11-17 MED ORDER — IOHEXOL 300 MG/ML  SOLN
100.0000 mL | Freq: Once | INTRAMUSCULAR | Status: AC | PRN
  Administered 2020-11-17: 45 mL via INTRA_ARTERIAL

## 2020-11-17 MED ORDER — DEXAMETHASONE SODIUM PHOSPHATE 10 MG/ML IJ SOLN
8.0000 mg | Freq: Once | INTRAMUSCULAR | Status: AC
  Administered 2020-11-17: 8 mg via INTRAVENOUS
  Filled 2020-11-17: qty 1

## 2020-11-17 MED ORDER — PANTOPRAZOLE SODIUM 40 MG IV SOLR
INTRAVENOUS | Status: AC
  Filled 2020-11-17: qty 40

## 2020-11-17 MED ORDER — MIDAZOLAM HCL 2 MG/2ML IJ SOLN
INTRAMUSCULAR | Status: DC | PRN
  Administered 2020-11-17 (×2): 1 mg via INTRAVENOUS

## 2020-11-17 MED ORDER — MIDAZOLAM HCL 2 MG/2ML IJ SOLN
INTRAMUSCULAR | Status: DC | PRN
  Administered 2020-11-17: 1 mg via INTRAVENOUS

## 2020-11-17 MED ORDER — INSULIN ASPART 100 UNIT/ML IJ SOLN
5.0000 [IU] | Freq: Once | INTRAMUSCULAR | Status: AC
  Administered 2020-11-17: 5 [IU] via SUBCUTANEOUS
  Filled 2020-11-17: qty 0.05
  Filled 2020-11-17: qty 1

## 2020-11-17 MED ORDER — PANTOPRAZOLE SODIUM 40 MG IV SOLR
40.0000 mg | Freq: Once | INTRAVENOUS | Status: AC
  Administered 2020-11-17: 40 mg via INTRAVENOUS
  Filled 2020-11-17: qty 40

## 2020-11-17 MED ORDER — DIPHENHYDRAMINE HCL 50 MG/ML IJ SOLN
INTRAMUSCULAR | Status: AC
  Filled 2020-11-17: qty 1

## 2020-11-17 MED ORDER — SODIUM CHLORIDE 0.9 % IV SOLN: INTRAVENOUS | Status: DC

## 2020-11-17 MED ORDER — FENTANYL CITRATE (PF) 100 MCG/2ML IJ SOLN
INTRAMUSCULAR | Status: AC
  Filled 2020-11-17: qty 2

## 2020-11-17 MED ORDER — SODIUM CHLORIDE 0.9 % IV SOLN
8.0000 mg | Freq: Once | INTRAVENOUS | Status: AC
  Administered 2020-11-17: 8 mg via INTRAVENOUS
  Filled 2020-11-17: qty 4

## 2020-11-17 MED ORDER — SODIUM CHLORIDE 0.9 % IV SOLN
2.0000 g | Freq: Once | INTRAVENOUS | Status: AC
  Administered 2020-11-17: 2 g via INTRAVENOUS
  Filled 2020-11-17: qty 2

## 2020-11-17 MED ORDER — HEPARIN SOD (PORK) LOCK FLUSH 100 UNIT/ML IV SOLN
500.0000 [IU] | Freq: Once | INTRAVENOUS | Status: AC
  Administered 2020-11-17: 500 [IU] via INTRAVENOUS
  Filled 2020-11-17: qty 5

## 2020-11-17 MED ORDER — IOHEXOL 300 MG/ML  SOLN
50.0000 mL | Freq: Once | INTRAMUSCULAR | Status: AC | PRN
  Administered 2020-11-17: 15 mL via INTRA_ARTERIAL

## 2020-11-17 NOTE — Sedation Documentation (Signed)
Patient transported to [facility] for nuclear study via stretcher by this RN. VSS. A&Ox4.

## 2020-11-17 NOTE — Sedation Documentation (Signed)
Patient is resting comfortably, with eyes closed, snoring lightly, in NAD.

## 2020-11-17 NOTE — Procedures (Signed)
Pre-procedure Diagnosis: Metastatic colon cancer Post-procedure Diagnosis: Same  Post Y-90 radioembolization of the lateral segment of the left lobe of the liver.    Complications: None Immediate  EBL: None  Keep right leg straight for 4 hrs (until 1530).    Signed: Sandi Mariscal Pager: [phone] 11/17/2020, 11:58 AM

## 2020-11-17 NOTE — H&P (Signed)
Referring Physician(s): Sherrill,B  Supervising Physician: Sandi Mariscal  Patient Status:  [facility]  Chief Complaint: Metastatic colon cancer to liver   Subjective: Patient familiar to IR service from right pelvic cyst aspiration 2018, left liver lesion biopsy in 2021 and Port-A-Cath placement in 2021. She has a history of metastatic colon cancer involving the ascending colon, status post laparoscopic right colectomy on 08/11/2013.  She has undergone multiple rounds of chemotherapy, most recently treated with irinotecan/panitumumab, complicated by diarrhea and skin toxicity.  She has been evaluated by surgical oncology at [facility] but has been deemed not a candidate for liver resection, hepatic infusion pump or ablation.  She underwent consultation with Dr. Pascal Lux on 10/06/2020 to discuss treatment options. She was deemed an appropriate candidate for Y 90 hepatic radioembolization, underwent hepatic/visceral arteriography with roadmapping study on 11/03/20 and presents today for right hepatic lobe Y 90 radioembolization.  She currently denies fever, headache, chest pain, dyspnea, cough, abdominal/back pain, nausea, vomiting or bleeding.  Past Medical History:  Diagnosis Date   ADD (attention deficit disorder)    Anemia, iron deficiency    Anxiety    Colon cancer ([place]) 08/2013   Depression    Difficulty swallowing pills    GERD (gastroesophageal reflux disease)    Headache    migraines "long time ago"   History of blood transfusion 08/16/2013   History of migraine    no problems in "a long time"   Metastatic adenocarcinoma of ovary, right ([place]) 12/2014   Restless leg syndrome    Seizures ([place])    last seizure 08/2012   Past Surgical History:  Procedure Laterality Date   ABDOMINAL HYSTERECTOMY  08/24/2004   partial   APPENDECTOMY  08/24/2004   COLON SURGERY  08/11/2013   removed a foot of colon   COLONOSCOPY  2019   colonoscopy 10-18-14     COLONOSCOPY WITH PROPOFOL  07/07/2013   CYSTOSCOPY   11/01/2003   ENDOMETRIAL FULGURATION  11/01/2003   ESOPHAGOGASTRODUODENOSCOPY  07/07/2013   IR ANGIOGRAM SELECTIVE EACH ADDITIONAL VESSEL  11/03/2020   IR ANGIOGRAM SELECTIVE EACH ADDITIONAL VESSEL  11/03/2020   IR ANGIOGRAM SELECTIVE EACH ADDITIONAL VESSEL  11/03/2020   IR ANGIOGRAM SELECTIVE EACH ADDITIONAL VESSEL  11/03/2020   IR ANGIOGRAM VISCERAL SELECTIVE  11/03/2020   IR ANGIOGRAM VISCERAL SELECTIVE  11/03/2020   IR EMBO ARTERIAL NOT HEMORR HEMANG INC GUIDE ROADMAPPING  11/03/2020   IR IMAGING GUIDED PORT INSERTION  03/05/2019   IR RADIOLOGIST EVAL & MGMT  10/06/2020   IR US GUIDE VASC ACCESS RIGHT  11/03/2020   LAPAROSCOPIC LYSIS OF ADHESIONS  11/01/2003   LAPAROSCOPIC PARTIAL COLECTOMY Right 08/11/2013   Procedure: LAPAROSCOPIC RIGHT HEMICOLECTOMY;  Surgeon: Stark Klein, MD;  Location: [place];  Service: General;  Laterality: Right;   LAPAROTOMY N/A 12/28/2014   Procedure: EXPLORATORY LAPAROTOMY;  Surgeon: Everitt Amber, MD;  [facility];  Service: Gynecology;  Laterality: N/A;   LEFT OOPHORECTOMY  08/24/2004   PANNICULECTOMY  08/24/2004   PORT-A-CATH REMOVAL Left 06/08/2014   Procedure: REMOVAL PORT-A-CATH;  Surgeon: Stark Klein, MD;  Location: [place];  Service: General;  Laterality: Left;   PORTACATH PLACEMENT Left 09/09/2013   Procedure: INSERTION PORT-A-CATH;  Surgeon: Stark Klein, MD;  Location: [place];  Service: General;  Laterality: Left;   SALPINGOOPHORECTOMY Right 12/28/2014   Procedure: RIGHT SALPINGO OOPHORECTOMY;  Surgeon: Everitt Amber, MD;  [facility];  Service: Gynecology;  Laterality: Right;   TUBAL LIGATION  11/07/2000  UNILATERAL SALPINGECTOMY Left 08/24/2004   WISDOM TOOTH EXTRACTION Bilateral 1994      Allergies: Adhesive [tape], Clindamycin/lincomycin, Promethazine, Eggs or egg-derived products, Metoclopramide hcl, Oxycodone-acetaminophen, Sulfa antibiotics, Levaquin [levofloxacin], and Penicillins  Medications: Prior to Admission medications    Medication Sig Start Date End Date Taking? Authorizing Provider  lacosamide (VIMPAT) 200 MG TABS tablet Take 1 tablet by mouth twice daily 10/17/20  Yes Jaffe, Adam R, DO  magnesium oxide (MAG-OX) 400 (240 Mg) MG tablet Take 1 tablet (400 mg total) by mouth 2 (two) times daily. 10/17/20  Yes Owens Shark, NP  metFORMIN (GLUCOPHAGE) 500 MG tablet Take 1 tablet by mouth twice daily with food for 7 days.  Then increase to 2 tablets with food in the morning and 1 tablet with food in the evening for 7 days.  Then increase to 2 tablets by mouth twice daily with food. 06/02/20  Yes [name], Raymond J, MD  vitamin B-12 (CYANOCOBALAMIN) 1000 MCG tablet Take 1,000 mcg by mouth daily.   Yes [provider]  Accu-Chek Softclix Lancets lancets SMARTSIG:2 Topical Twice Daily 07/22/20   [provider]  acetaminophen (TYLENOL) 500 MG tablet Take 1,000 mg by mouth every 6 (six) hours as needed.    [provider]  blood glucose meter kit and supplies Dispense based on patient and insurance preference. Use up to four times daily as directed. (FOR ICD-10 E10.9, E11.9). 07/22/20   [name], Raymond J, MD  diphenhydrAMINE (BENADRYL) 50 MG tablet Take 100 mg by mouth at bedtime as needed for itching or sleep.    [provider]  diphenoxylate-atropine (LOMOTIL) 2.5-0.025 MG tablet TAKE 1- 2 TABLETS BY MOUTH 4 TIMES DAILY AS NEEDED FOR DIARRHEA /  LOOSE  STOOLS 08/23/20   [name], MD  fluticasone (CUTIVATE) 0.05 % cream Apply topically 2 (two) times daily. To rash 07/21/20   Owens Shark, NP  hydrocortisone 2.5 % cream Apply topically 2 (two) times daily. To rash 04/07/20   [name], MD  lidocaine-prilocaine (EMLA) cream Apply 1 application topically as directed. Apply to port site 1 hour prior to stick and cover with plastic wrap 10/22/19   [name], MD  loperamide (IMODIUM) 2 MG capsule Take 2 mg by mouth as needed for diarrhea or loose stools.    [provider]  LORazepam (ATIVAN) 0.5 MG tablet Take 1 tablet by mouth twice daily as needed for anxiety 11/01/20   [name], MD  omeprazole (PRILOSEC) 40 MG capsule Take 40 mg by mouth as needed.    [provider]  prochlorperazine (COMPAZINE) 10 MG tablet Take 1 tablet (10 mg total) by mouth every 6 (six) hours as needed for nausea or vomiting. 12/10/19   [name], MD     Vital Signs: BP 127/90   Pulse 68   Temp 98.4 F (36.9 C) (Oral)   Resp 20   SpO2 99%   Physical Exam awake, alert.  Chest clear to auscultation bilaterally.  Heart with regular rate and rhythm.  Abdomen soft, positive bowel sounds, nontender.  No lower extremity edema.  Imaging: No results found.  Labs:  CBC: Recent Labs    09/12/20 0910 10/17/20 1331 11/03/20 0810 11/17/20 0805  WBC 5.4 3.1* 3.2* 3.4*  HGB 11.5* 11.7* 12.1 12.2  HCT 37.6 39.2 39.4 39.3  PLT 164 167 144* 124*    COAGS: Recent Labs    11/03/20 0810 11/17/20 0805  INR 1.1  1.1    BMP: Recent Labs    09/12/20 0910 10/17/20 1331 11/03/20 0810 11/17/20 0805  NA 139 141 135 135  K 4.2 4.3 4.0 4.0  CL 105 106 105 103  CO2 27 27 25 24   GLUCOSE 176* 239* 333* 311*  BUN 13 17 16 18   CALCIUM 8.9 9.6 9.4 9.1  CREATININE 0.57 0.86 0.78 0.62  GFRNONAA >60 >60 >60 >60    LIVER FUNCTION TESTS: Recent Labs    09/12/20 0910 10/17/20 1331 11/03/20 0810 11/17/20 0805  BILITOT 0.5 0.6 0.6 0.8  AST 50* 35 65* 49*  ALT 56* 40 89* 68*  ALKPHOS 118 136* 138* 194*  PROT 6.5 7.3 7.2 7.2  ALBUMIN 4.0 4.2 3.6 3.8    Assessment and Plan: Patient familiar to IR service from right pelvic cyst aspiration 2018, left liver lesion biopsy in 2021 and Port-A-Cath placement in 2021. She has a history of metastatic colon cancer involving the ascending colon, status post laparoscopic right colectomy on 08/11/2013.  She has undergone multiple rounds of chemotherapy, most recently treated with irinotecan/panitumumab,  complicated by diarrhea and skin toxicity.  She has been evaluated by surgical oncology at [facility] but has been deemed not a candidate for liver resection, hepatic infusion pump or ablation.  She underwent consultation with Dr. Pascal Lux on 10/06/2020 to discuss treatment options. She was deemed an appropriate candidate for Y 90 hepatic radioembolization, underwent hepatic/visceral arteriography with roadmapping study on 11/03/20 and presents today for right hepatic lobe Y 90 radioembolization.  Risks and benefits of procedure  were discussed with the patient including, but not limited to bleeding, infection, vascular injury or contrast induced renal failure.  This interventional procedure involves the use of X-rays and because of the nature of the planned procedure, it is possible that we will have prolonged use of X-ray fluoroscopy.  Potential radiation risks to you include (but are not limited to) the following: - A slightly elevated risk for cancer  several years later in life. This risk is typically less than 0.5% percent. This risk is low in comparison to the normal incidence of human cancer, which is 33% for women and 50% for men according to the [place]. - Radiation induced injury can include skin redness, resembling a rash, tissue breakdown / ulcers and hair loss (which can be temporary or permanent).   The likelihood of either of these occurring depends on the difficulty of the procedure and whether you are sensitive to radiation due to previous procedures, disease, or genetic conditions.   IF your procedure requires a prolonged use of radiation, you will be notified and given written instructions for further action.  It is your responsibility to monitor the irradiated area for the 2 weeks following the procedure and to notify your physician if you are concerned that you have suffered a radiation induced injury.    All of the patient's questions were answered, patient is agreeable to  proceed.  Consent signed and in chart.      Electronically Signed: D. Rowe Robert, PA-C 11/17/2020, 8:53 AM   I spent a total of 20 minutes at the the patient's bedside AND on the patient's hospital floor or unit, greater than 50% of which was counseling/coordinating care for hepatic/visceral arteriogram with right hepatic lobe Y 90 radioembolization

## 2020-11-18 ENCOUNTER — Other Ambulatory Visit: Payer: Self-pay

## 2020-11-18 MED ORDER — LACOSAMIDE 200 MG PO TABS: 200.0000 mg | ORAL_TABLET | Freq: Two times a day (BID) | ORAL | 1 refills | Status: DC

## 2020-11-24 ENCOUNTER — Encounter: Payer: Self-pay | Admitting: Nurse Practitioner

## 2020-11-24 ENCOUNTER — Other Ambulatory Visit: Payer: Self-pay

## 2020-11-24 ENCOUNTER — Inpatient Hospital Stay (HOSPITAL_BASED_OUTPATIENT_CLINIC_OR_DEPARTMENT_OTHER): Payer: BC Managed Care – PPO | Admitting: Nurse Practitioner

## 2020-11-24 ENCOUNTER — Inpatient Hospital Stay: Payer: BC Managed Care – PPO | Attending: Oncology

## 2020-11-24 ENCOUNTER — Inpatient Hospital Stay: Payer: BC Managed Care – PPO

## 2020-11-24 VITALS — BP 138/89 | HR 79 | Temp 98.4°F | Resp 18 | Ht 66.0 in | Wt 159.0 lb

## 2020-11-24 DIAGNOSIS — C182 Malignant neoplasm of ascending colon: Secondary | ICD-10-CM

## 2020-11-24 DIAGNOSIS — R21 Rash and other nonspecific skin eruption: Secondary | ICD-10-CM | POA: Diagnosis not present

## 2020-11-24 DIAGNOSIS — D696 Thrombocytopenia, unspecified: Secondary | ICD-10-CM | POA: Diagnosis not present

## 2020-11-24 DIAGNOSIS — C787 Secondary malignant neoplasm of liver and intrahepatic bile duct: Secondary | ICD-10-CM | POA: Diagnosis not present

## 2020-11-24 LAB — CBC WITH DIFFERENTIAL (CANCER CENTER ONLY)
Abs Immature Granulocytes: 0.02 10*3/uL (ref 0.00–0.07)
Basophils Absolute: 0 10*3/uL (ref 0.0–0.1)
Basophils Relative: 0 %
Eosinophils Absolute: 0.1 10*3/uL (ref 0.0–0.5)
Eosinophils Relative: 3 %
HCT: 38.5 % (ref 36.0–46.0)
Hemoglobin: 12 g/dL (ref 12.0–15.0)
Immature Granulocytes: 1 %
Lymphocytes Relative: 13 %
Lymphs Abs: 0.5 10*3/uL — ABNORMAL LOW (ref 0.7–4.0)
MCH: 23.3 pg — ABNORMAL LOW (ref 26.0–34.0)
MCHC: 31.2 g/dL (ref 30.0–36.0)
MCV: 74.8 fL — ABNORMAL LOW (ref 80.0–100.0)
Monocytes Absolute: 0.3 10*3/uL (ref 0.1–1.0)
Monocytes Relative: 7 %
Neutro Abs: 2.8 10*3/uL (ref 1.7–7.7)
Neutrophils Relative %: 76 %
Platelet Count: 151 10*3/uL (ref 150–400)
RBC: 5.15 MIL/uL — ABNORMAL HIGH (ref 3.87–5.11)
RDW: 15.5 % (ref 11.5–15.5)
WBC Count: 3.7 10*3/uL — ABNORMAL LOW (ref 4.0–10.5)
nRBC: 0 % (ref 0.0–0.2)

## 2020-11-24 LAB — CMP (CANCER CENTER ONLY)
ALT: 59 U/L — ABNORMAL HIGH (ref 0–44)
AST: 51 U/L — ABNORMAL HIGH (ref 15–41)
Albumin: 3.9 g/dL (ref 3.5–5.0)
Alkaline Phosphatase: 166 U/L — ABNORMAL HIGH (ref 38–126)
Anion gap: 8 (ref 5–15)
BUN: 14 mg/dL (ref 6–20)
CO2: 27 mmol/L (ref 22–32)
Calcium: 9.5 mg/dL (ref 8.9–10.3)
Chloride: 100 mmol/L (ref 98–111)
Creatinine: 0.6 mg/dL (ref 0.44–1.00)
GFR, Estimated: >60 mL/min (ref >60)
Glucose, Bld: 290 mg/dL — ABNORMAL HIGH (ref 70–99)
Potassium: 3.9 mmol/L (ref 3.5–5.1)
Sodium: 135 mmol/L (ref 135–145)
Total Bilirubin: 0.6 mg/dL (ref 0.3–1.2)
Total Protein: 7.2 g/dL (ref 6.5–8.1)

## 2020-11-24 LAB — MAGNESIUM: Magnesium: 1.5 mg/dL — ABNORMAL LOW (ref 1.7–2.4)

## 2020-11-24 NOTE — Progress Notes (Signed)
[place] OFFICE PROGRESS NOTE   Diagnosis: Colon cancer  INTERVAL HISTORY:   [name] returns as scheduled.  She underwent Y 90 lateral segment left lobe of the liver 11/17/2020.  She is having periodic nausea.  Bowels moving with the aid of MiraLAX.  No seizure activity she denies pain.  Appetite and energy level vary.  Objective:  Vital signs in last 24 hours:  Blood pressure 138/89, pulse 79, temperature 98.4 F (36.9 C), temperature source Oral, resp. rate 18, height 5' 6"  (1.676 m), weight 159 lb (72.1 kg), SpO2 98 %.    HEENT: No thrush or ulcers. Resp: Lungs clear bilaterally. Cardio: Regular rate and rhythm. GI: Abdomen soft and nontender.  No hepatomegaly. Vascular: No leg edema. Neuro: Alert and oriented. Skin: Healed midline scar.  Small area of mild erythema at the mid point of the scar.  No nodularity.   Lab Results:  Lab Results  Component Value Date   WBC 3.7 (L) 11/24/2020   HGB 12.0 11/24/2020   HCT 38.5 11/24/2020   MCV 74.8 (L) 11/24/2020   PLT 151 11/24/2020   NEUTROABS 2.8 11/24/2020    Imaging:  No results found.  Medications: I have reviewed the patient's current medications.  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 The tumor returned microsatellite stable with no loss of mismatch repair protein expression   APC mutated. No BRAF, KRAS, or NRAS mutation On Foundation 1 testing   Cycle 1 adjuvant FOLFOX 09/08/2013   Cycle 2 adjuvant FOLFOX 09/24/2013   Cycle 3 adjuvant FOLFOX 10/08/2013.   Cycle 4 adjuvant FOLFOX 10/22/2013.   Cycle 5 adjuvant FOLFOX 11/05/2013. Oxaliplatin held due to thrombocytopenia. Cycle 6 FOLFOX 11/19/2013. Cycle 7 FOLFOX 12/03/2013. Oxaliplatin held secondary to thrombocytopenia. Cycle 8 FOLFOX 12/17/2013. Cycle 9 FOLFOX 01/04/2014. Oxaliplatin held secondary to neutropenia.    Cycle 10 FOLFOX 01/21/2014. Oxaliplatin held secondary to thrombocytopenia. Cycle 11 FOLFOX 02/04/2014 Cycle 12 FOLFOX 02/18/2014, oxaliplatin dose reduced secondary tothrombocytopenia CT abdomen/pelvis 01/30/2014 revealed splenomegaly and no evidence of recurrent colon cancer CT chest 04/07/2014 with a stable right lower lobe nodule and no evidence for metastatic disease, no nodules seen on the CT 11/26/2014 Markedly elevated CEA 11/24/2014 CT 11/26/2014 revealed a right pelvic mass, splenomegaly, small volume ascites Right salpingo-oophorectomy 12/28/2014 with the pathology confirming metastatic colon cancer CTs 03/23/2016-no evidence of recurrent or metastatic disease. CT 11/26/2016-enlargement of a fluid density structure the right pelvic sidewall, no other evidence of metastatic disease CT aspiration right pelvic cyst 12/19/2016.  Cytology-BENIGN REACTIVE/REPARATIVE CHANGES. CTs 06/05/2017- no evidence of metastatic disease, mild cirrhotic changes with splenomegaly CTs 12/11/2017- recurrent cystic right adnexal mass, similar to on the CT 11/26/2016, stable mild splenomegaly CTs 04/23/2018- enlargement of cystic right adnexal mass with mural nodularity, no other evidence of metastatic disease Cytoreductive surgery/HIPEC with mitomycin by Dr. Clovis Riley at [facility] 08/12/2018-R1 resection achieved.  Cytoreduction included omentectomy, LAR, right salpingo-oophorectomy and left colonic gutter/pelvic stripping.  Pathology on the rectum showed recurrent/metastatic adenocarcinoma, tumor 2.0 cm, predominantly involving the subserosa and muscularis propria of the colon, proximal and distal margins of resection were negative, vascular invasion present, metastatic carcinoma present in 1 out of 5 lymph nodes; omentum resection with no malignancy seen, no metastatic carcinoma identified in 1 lymph node examined; left gutter stripping positive metastatic adenocarcinoma; right ovary resection positive metastatic  adenocarcinoma. 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 CEA  68 02/03/2019 Biopsy liver lesion 02/11/2019-metastatic adenocarcinoma consistent with primary colonic adenocarcinoma CTs 02/27/2019-multiple liver lesions increased in size, new lesion in the lateral right lobe of the liver.  No evidence of metastatic disease in the chest.  Redemonstrated moderate left hydronephrosis and proximal hydroureter without discrete lesion or obstructing etiology at the transition point of the mid ureter. Cycle 1 FOLFIRI/Panitumumab 03/12/2019 Cycle 2 FOLFIRI/Panitumumab 03/26/2019-bolus 5-FU and irinotecan held secondary to neutropenia Cycle 3 FOLFIRI/Panitumumab 04/09/2019, Udenyca added-not given secondary to seizure/discontinuation of the 5-FU pump CT abdomen/pelvis at [facility] 05/04/2019-no residual fluid collection at the left abdominal wall abscess, stable moderate left hydronephrosis, multifocal indeterminate liver lesions--liver lesions significantly improved Cycle 4 FOLFIRI/Panitumumab 05/07/2019, Udenyca Cycle 5 FOLFIRI/Panitumumab 05/20/2019, Udenyca Cycle 6 FOLFIRI/Panitumumab 06/18/2019, Udenyca Cycle 7 FOLFIRI/Panitumumab 07/01/2019 (Irinotecan dose reduced due to thrombocytopenia), Udenyca--Udenyca was not given Cycle 8 FOLFIRI/Panitumumab 07/15/2019, Udenyca Cycle 9 FOLFIRI/Panitumumab 07/29/2019, Udenyca Cycle 10 FOLFIRI/Panitumumab 08/13/2019, Udenyca CTs at [facility] 08/19/2019-decreased size of the hypodense and hyperdense lesions within segment 2 of the left hepatic lobe.  No new lesions identified.  Similar presacral soft tissue thickening with adjacent stable alignment in the rectum.  Improved but persistent left UPJ obstruction with mild left hydronephrosis.  Persistent but decreased size of the wound within the left mid abdomen abdominal wall. Cycle 11 irinotecan/Panitumumab 08/27/2019, Udenyca Cycle 12 irinotecan/Panitumumab  10/08/2019, Udenyca Cycle 13 irinotecan/Panitumumab 11/05/2019, Udenyca CT abdomen/pelvis 11/19/2019-no new or progressive interval findings.  Stable appearance of the calcified lesion in the anterior left hepatic dome with second tiny low-density lesion in the dome of the lateral segment left liver.  Both lesions are markedly decreased since 02/27/2019.  Stable mild fullness left intrarenal collecting system and renal pelvis.  Similar appearance of abnormal soft tissue in the left paramidline subcutaneous fat with associated skin thickening, this likely correlates to the fistula. Cycle 14 irinotecan/Panitumumab 11/19/2019, Udenyca Cycle 15 irinotecan/Panitumumab 12/02/2019, Udenyca CT abdomen/pelvis at [facility] 02/11/2020- similar to slight decrease in segment 2 hypodense and hyperdense lesions (compared to a CT from July 2020), no new lesions, splenomegaly, no fluid collection or abscess, mild stranding adjacent to the incision with overlying skin thickening Cycle 16 irinotecan/Panitumumab 03/03/2020, Udenyca Cycle 17 irinotecan/Panitumumab 03/17/2020, Udenyca Cycle 18 irinotecan/panitumumab 04/07/2020, Udenyca Cycle 19 irinotecan/Panitumumab 04/28/2020, Udenyca Cycle 20 irinotecan/panitumumab 05/26/2020, Udenyca Cycle 21 irinotecan/panitumumab 06/30/2020, Udenyca CT abdomen/pelvis 07/19/2020-mildly increased hepatic steatosis.  Increased size of 2.8 cm hypervascular lesion segment 2 left hepatic lobe. Cycle 22 irinotecan/Panitumumab 07/21/2020, Udenyca MRI Abd 07/28/2020: liver lesions consistent with hepatic metastasis Cycle 23 irinotecan/Panitumumab 08/04/2020, Udenyca  Cycle 24 irinotecan/panitumumab 08/22/2020 Irinotecan/panitumumab discontinued secondary to diarrhea and skin toxicity Guardant 360 on 09/12/2020-K-ras G12V, PIK3CA,NTRK2-VUS, tumor mutation burden 6.85, MSI-high not detected Referred for consideration of Y 90 CTs 10/11/2020-for enlarging hepatic metastatic lesions identified.  No new lesions.   Several tiny nodules in the lungs in the 2 to 3 mm diameter range.  Mild splenomegaly.  Mild left hydronephrosis. 11/17/2020-Y90 lateral segment left lobe of the liver Mild elevation of the CEA beginning January 2016 , normal on 05/19/2014   History of iron deficiency anemia   seizure disorder; seizure 04/09/2019.  Brain CT negative.  Now on Keppra. history of depression   4 mm right lower lobe nodule on a staging chest CT 09/08/2013 , stable on a CT 04/07/2014  Hospitalization 09/24/2013 through 09/26/2013 with fever and abdominal pain.   09/24/2013 urine culture positive for coag negative staph.   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  Scope advanced for approximately 25 cm.  Most proximal portion had necrotic appearing debris.  Colostomy bag insufflated suggesting some type of communication between the pouch and the proximal colon.  Introduction of scope into the ostomy found to available directions.  1 toward the distal pouch with similar appearing necrotic debris encountered.  The other was about a 30 cm segment of normal-appearing colonic mucosa to the level of the previous right hemicolectomy ileocolonic anastomosis. Port-A-Cath placement interventional radiology 03/05/2019 Left leg/foot weakness-brain MRI 09/03/2019 with no evidence of metastatic disease, referred to physical therapy Possible abdominal wall abscess status post evaluation by surgery at [facility] 04/03/2019, antibiotics initiated; incision and drainage with purulent material removed 04/22/2019 CT at [facility] 05/04/2019-no fluid collection at the site of the left abdominal wall abscess, "indeterminate" liver lesions CT at [facility] 06/04/2019-persistent open wound of the left abdomen,  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  No malignancy.   16. COVID-19 + 09/10/2019 17. Hospitalized with seizure 09/17/2019 through 09/19/2019-MRI brain without evidence of metastatic disease. Seen by neurology. Keppra dose increased. 19.  Rash secondary to Panitumumab 20.  COVID-19 09/22/2020  Disposition: [name] appears stable.  She completed Y 90 lateral segment left lobe of the liver 11/17/2020.  Seems to have tolerated well.  She is scheduled to return for Y 90 of the medial segment of the left lobe of the liver and right lobe of the liver 12/08/2020.  She will return for follow-up here 12/22/2020.  We are available to see her sooner if needed.    Ned Card ANP/GNP-BC   11/24/2020  11:26 AM

## 2020-11-25 ENCOUNTER — Telehealth: Payer: Self-pay

## 2020-11-25 NOTE — Telephone Encounter (Signed)
Called no answer message left with new order to restart Magnesium and to call back for any questions changes or concerns

## 2020-11-25 NOTE — Telephone Encounter (Signed)
-----   Message from Owens Shark, NP sent at 11/24/2020  4:45 PM EDT ----- Please let her know magnesium level is low, she needs to resume magnesium oxide.

## 2020-12-02 DIAGNOSIS — L600 Ingrowing nail: Secondary | ICD-10-CM | POA: Diagnosis not present

## 2020-12-03 ENCOUNTER — Emergency Department (HOSPITAL_COMMUNITY)
Admission: EM | Admit: 2020-12-03 | Discharge: 2020-12-03 | Disposition: A | Discharge status: Home / Self Care | Payer: BC Managed Care – PPO | Attending: Emergency Medicine | Admitting: Emergency Medicine

## 2020-12-03 ENCOUNTER — Encounter: Payer: Self-pay | Admitting: Nurse Practitioner

## 2020-12-03 ENCOUNTER — Encounter (HOSPITAL_BASED_OUTPATIENT_CLINIC_OR_DEPARTMENT_OTHER): Payer: Self-pay | Admitting: Family Medicine

## 2020-12-03 DIAGNOSIS — Z8616 Personal history of COVID-19: Secondary | ICD-10-CM | POA: Diagnosis not present

## 2020-12-03 DIAGNOSIS — R21 Rash and other nonspecific skin eruption: Secondary | ICD-10-CM | POA: Diagnosis not present

## 2020-12-03 DIAGNOSIS — Z7984 Long term (current) use of oral hypoglycemic drugs: Secondary | ICD-10-CM | POA: Diagnosis not present

## 2020-12-03 DIAGNOSIS — Z79899 Other long term (current) drug therapy: Secondary | ICD-10-CM | POA: Insufficient documentation

## 2020-12-03 DIAGNOSIS — Z85038 Personal history of other malignant neoplasm of large intestine: Secondary | ICD-10-CM | POA: Diagnosis not present

## 2020-12-03 DIAGNOSIS — E119 Type 2 diabetes mellitus without complications: Secondary | ICD-10-CM | POA: Diagnosis not present

## 2020-12-03 MED ORDER — HYDROXYZINE HCL 25 MG PO TABS
25.0000 mg | ORAL_TABLET | Freq: Once | ORAL | Status: AC
  Administered 2020-12-03: 25 mg via ORAL
  Filled 2020-12-03: qty 1

## 2020-12-03 MED ORDER — HYDROXYZINE HCL 25 MG PO TABS: 25.0000 mg | ORAL_TABLET | Freq: Four times a day (QID) | ORAL | 0 refills | Status: DC

## 2020-12-03 NOTE — ED Triage Notes (Signed)
Rash on legs and arms for 2 days. States rash itches

## 2020-12-03 NOTE — ED Provider Notes (Signed)
[facility] EMERGENCY DEPARTMENT Provider Note   CSN: [id number] Arrival date & time: 12/03/20  1547     History Chief Complaint  Patient presents with   Rash    [name] is a 45 y.o. female. 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  States that rash began Thursday evening after bath. States that it initially began on right upper arm and has since spread to back, left arm, sides, and buttocks. She notes this morning she began having swelling to the right upper eyelid. Describes rash as "very itchy." She denies ever having difficulty breathing, swelling to the lips or tongue. Denies changes to soaps, detergents or medicines. She does have numerous medication allergies but denies environmental or food allergies. Has not tried any new foods. Denies recent travel or camping/hiking/outdoor activities with opportunity for insect bite.    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   Restless leg syndrome    Seizures ([place])    last seizure 08/2012    Patient Active Problem List   Diagnosis Date Noted   Diabetes ([place]) 06/02/2020   UTI (urinary tract infection) 06/02/2020   Liver metastases ([place])    Metastatic colon cancer to liver [facility])    Palliative care by specialist    COVID-19 virus infection 09/17/2019    Port-A-Cath in place 06/04/2019   Seizure ([place]) 04/10/2019   Goals of care, counseling/discussion 02/23/2019   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   Ileus ([place])    Metastatic malignant neoplasm ([place])    Genetic testing 11/13/2015   Sinusitis 01/29/2015   Pelvic mass in female 12/28/2014   Ovarian mass, right    Hypokalemia 02/03/2014   Nausea 12/20/2013   Fever 12/18/2013   Dehydration 12/18/2013   Diarrhea 10/28/2013   Malfunction of device 10/28/2013   Weight loss 10/28/2013   Mucositis (ulcerative) due to antineoplastic therapy 10/28/2013   Anemia due to antineoplastic chemotherapy 09/25/2013   Chemotherapy induced thrombocytopenia 09/25/2013   Hypomagnesemia 09/25/2013   Hypophosphatemia 09/25/2013   Adverse drug effect 09/25/2013   Abdominal pain 09/24/2013   Cancer of ascending colon s/p right colectomy 08/11/2013 08/05/2013   Anemia, iron deficiency 01/28/2013   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[place]) 11/11/2012   Discoloration of skin of face 09/03/2012   Medication reaction 05/03/2012   Migraine 03/31/2012   Hyperglycemia 01/18/2012   Fatigue 12/12/2011   ADD (attention deficit disorder) 09/23/2010   Right wrist pain 05/01/2010   LIBIDO, DECREASED 04/07/2010   IRON DEFICIENCY 05/17/2009   ANA POSITIVE 05/17/2009   OVARIAN CYST 05/10/2009   FATTY LIVER DISEASE 03/24/2009   TRANSAMINASES, SERUM, ELEVATED 03/21/2009   Gastroparesis 08/16/2008   DEPRESSION/ANXIETY 09/19/2006   HYPERLIPIDEMIA 08/16/2006   RESTLESS LEG SYNDROME 08/16/2006   GERD 08/16/2006   IBS  08/16/2006    Past Surgical History:  Procedure Laterality Date   ABDOMINAL HYSTERECTOMY  08/24/2004   partial   APPENDECTOMY  08/24/2004   COLON SURGERY  08/11/2013   removed a foot of colon   COLONOSCOPY  2019   colonoscopy 10-18-14     COLONOSCOPY WITH PROPOFOL  07/07/2013   CYSTOSCOPY  11/01/2003   ENDOMETRIAL FULGURATION  11/01/2003   ESOPHAGOGASTRODUODENOSCOPY  07/07/2013   IR ANGIOGRAM SELECTIVE EACH  ADDITIONAL VESSEL  11/03/2020   IR ANGIOGRAM SELECTIVE EACH ADDITIONAL VESSEL  11/03/2020   IR ANGIOGRAM SELECTIVE EACH ADDITIONAL VESSEL  11/03/2020   IR ANGIOGRAM SELECTIVE EACH ADDITIONAL VESSEL  11/03/2020   IR ANGIOGRAM SELECTIVE EACH ADDITIONAL VESSEL  11/17/2020   IR ANGIOGRAM SELECTIVE EACH ADDITIONAL VESSEL  11/17/2020   IR ANGIOGRAM SELECTIVE EACH ADDITIONAL VESSEL  11/17/2020   IR ANGIOGRAM VISCERAL SELECTIVE  11/03/2020   IR ANGIOGRAM VISCERAL SELECTIVE  11/03/2020   IR ANGIOGRAM VISCERAL SELECTIVE  11/17/2020   IR EMBO ARTERIAL NOT HEMORR HEMANG INC GUIDE ROADMAPPING  11/03/2020   IR EMBO TUMOR ORGAN ISCHEMIA INFARCT INC GUIDE ROADMAPPING  11/17/2020   IR IMAGING GUIDED PORT INSERTION  03/05/2019   IR RADIOLOGIST EVAL & MGMT  10/06/2020   IR US GUIDE VASC ACCESS RIGHT  11/03/2020   IR US GUIDE VASC ACCESS RIGHT  11/17/2020   LAPAROSCOPIC LYSIS OF ADHESIONS  11/01/2003   LAPAROSCOPIC PARTIAL COLECTOMY Right 08/11/2013   Procedure: LAPAROSCOPIC RIGHT HEMICOLECTOMY;  Surgeon: Stark Klein, MD;  Location: [place] [facility];  Service: General;  Laterality: Right;   LAPAROTOMY N/A 12/28/2014   Procedure: EXPLORATORY LAPAROTOMY;  Surgeon: Everitt Amber, MD;  Location: [facility];  Service: Gynecology;  Laterality: N/A;   LEFT OOPHORECTOMY  08/24/2004   PANNICULECTOMY  08/24/2004   PORT-A-CATH REMOVAL Left 06/08/2014   Procedure: REMOVAL PORT-A-CATH;  Surgeon: Stark Klein, MD;  Location: [place];  Service: General;  Laterality: Left;   PORTACATH PLACEMENT Left 09/09/2013   Procedure: INSERTION PORT-A-CATH;  Surgeon: Stark Klein, MD;  Location: [place];  Service: General;  Laterality: Left;   SALPINGOOPHORECTOMY Right 12/28/2014   Procedure: RIGHT SALPINGO OOPHORECTOMY;  Surgeon: Everitt Amber, MD;  Location: [facility];  Service: Gynecology;  Laterality: Right;   TUBAL LIGATION  11/07/2000   UNILATERAL SALPINGECTOMY Left 08/24/2004   WISDOM TOOTH EXTRACTION Bilateral 1994     OB History   No obstetric  history on file.     Family History  Problem Relation Age of Onset   Colon polyps Mother        3+ colon polyps at each colonoscopy - "several"   Colitis Mother    Heart attack Mother    Diabetes Mother    Heart disease Mother    Diabetes Father    Heart attack Father    Stroke Father    Congestive Heart Failure Father    Heart disease Father    Cirrhosis Maternal Aunt    Lung cancer Maternal Uncle        d. 40s; smoker and worked around asbestos   Other Paternal Grandfather        "bone cancer"   Colon cancer Maternal Uncle        dx early 9s   Breast cancer Maternal Aunt        dx 50s-60s; s/p lumpectomy   Dementia Maternal Aunt    Other Cousin        maternal 1st cousin w/ "lung issues"   Dementia Paternal Aunt  Dementia Paternal Uncle    Prostate cancer Paternal Uncle 16   Esophageal cancer Neg Hx    Rectal cancer Neg Hx    Stomach cancer Neg Hx     Social History   Tobacco Use   Smoking status: Never   Smokeless tobacco: Never  Vaping Use   Vaping Use: Never used  Substance Use Topics   Alcohol use: No   Drug use: No    Home Medications Prior to Admission medications   Medication Sig Start Date End Date Taking? Authorizing Provider  Accu-Chek Softclix Lancets lancets SMARTSIG:2 Topical Twice Daily 07/22/20   [provider]  acetaminophen (TYLENOL) 500 MG tablet Take 1,000 mg by mouth every 6 (six) hours as needed.    [provider]  blood glucose meter kit and supplies Dispense based on patient and insurance preference. Use up to four times daily as directed. (FOR ICD-10 E10.9, E11.9). 07/22/20   [name], Raymond J, MD  diphenhydrAMINE (BENADRYL) 50 MG tablet Take 100 mg by mouth at bedtime as needed for itching or sleep.    [provider]  diphenoxylate-atropine (LOMOTIL) 2.5-0.025 MG tablet TAKE 1- 2 TABLETS BY MOUTH 4 TIMES DAILY AS NEEDED FOR DIARRHEA /  LOOSE  STOOLS Patient not taking: Reported on 11/24/2020 08/23/20    [name], MD  ferrous sulfate 325 (65 FE) MG tablet Take 325 mg by mouth daily with breakfast.    [provider]  lacosamide (VIMPAT) 200 MG TABS tablet Take 1 tablet (200 mg total) by mouth 2 (two) times daily. 11/18/20   [name], DO  lidocaine-prilocaine (EMLA) cream Apply 1 application topically as directed. Apply to port site 1 hour prior to stick and cover with plastic wrap 10/22/19   [name], MD  loperamide (IMODIUM) 2 MG capsule Take 2 mg by mouth as needed for diarrhea or loose stools. Patient not taking: Reported on 11/24/2020    [provider]  LORazepam (ATIVAN) 0.5 MG tablet Take 1 tablet by mouth twice daily as needed for anxiety 11/01/20   [name], MD  magnesium oxide (MAG-OX) 400 (240 Mg) MG tablet Take 1 tablet (400 mg total) by mouth 2 (two) times daily. Patient not taking: Reported on 11/24/2020 10/17/20   Owens Shark, NP  metFORMIN (GLUCOPHAGE) 500 MG tablet Take 1 tablet by mouth twice daily with food for 7 days.  Then increase to 2 tablets with food in the morning and 1 tablet with food in the evening for 7 days.  Then increase to 2 tablets by mouth twice daily with food. 06/02/20   [name], Raymond J, MD  omeprazole (PRILOSEC) 40 MG capsule Take 40 mg by mouth as needed.    [provider]  Polyethylene Glycol 3350 (MIRALAX PO) Take 17 g by mouth daily as needed.    [provider]  prochlorperazine (COMPAZINE) 10 MG tablet Take 1 tablet (10 mg total) by mouth every 6 (six) hours as needed for nausea or vomiting. 12/10/19   [name], MD  vitamin B-12 (CYANOCOBALAMIN) 1000 MCG tablet Take 1,000 mcg by mouth daily.    [provider]    Allergies    Adhesive [tape], Clindamycin/lincomycin, Promethazine, Eggs or egg-derived products, Metoclopramide hcl, Oxycodone-acetaminophen, Sulfa antibiotics, Levaquin [levofloxacin], and Penicillins  Review of Systems   Review of Systems  Constitutional:   Negative for fever.  HENT:  Negative for facial swelling and trouble swallowing.   Eyes:  Right upper eyelid redness  Respiratory:  Negative for shortness of breath, wheezing and stridor.   Skin:  Positive for rash.  Allergic/Immunologic: Positive for immunocompromised state. Negative for environmental allergies and food allergies.  All other systems reviewed and are negative.  Physical Exam Updated Vital Signs BP (!) 138/98 (BP Location: Right Arm)   Pulse 94   Temp 99.3 F (37.4 C) (Oral)   Resp 17   SpO2 100%   Physical Exam Vitals and nursing note reviewed.  Constitutional:      General: She is not in acute distress.    Appearance: Normal appearance. She is not toxic-appearing.  HENT:     Head: Normocephalic and atraumatic.     Nose: Nose normal.     Mouth/Throat:     Mouth: Mucous membranes are moist.     Pharynx: Oropharynx is clear. No posterior oropharyngeal erythema.  Eyes:     General: No scleral icterus.       Right eye: No discharge.        Left eye: No discharge.     Extraocular Movements: Extraocular movements intact.     Conjunctiva/sclera: Conjunctivae normal.     Pupils: Pupils are equal, round, and reactive to light.     Comments: Right upper eyelid with mild swelling and erythema.  Cardiovascular:     Rate and Rhythm: Normal rate and regular rhythm.     Pulses: Normal pulses.  Pulmonary:     Effort: Pulmonary effort is normal. No respiratory distress.     Breath sounds: Normal breath sounds. No wheezing.  Abdominal:     General: Bowel sounds are normal.     Palpations: Abdomen is soft.  Musculoskeletal:     Cervical back: Normal range of motion.  Skin:    General: Skin is warm and dry.     Capillary Refill: Capillary refill takes less than 2 seconds.     Findings: Rash present.     Comments: See media for pictures of rash  Multiple areas on arms, bilateral sides, inferior to bilateral buttocks with pink maculopapular urticarial rash   Neurological:     General: No focal deficit present.     Mental Status: She is alert and oriented to person, place, and time. Mental status is at baseline.  Psychiatric:        Mood and Affect: Mood normal.        Behavior: Behavior normal.    ED Results / Procedures / Treatments   Labs (all labs ordered are listed, but only abnormal results are displayed) Labs Reviewed - No data to display  EKG None  Radiology No results found.  Procedures Procedures   Medications Ordered in ED Medications  hydrOXYzine (ATARAX/VISTARIL) tablet 25 mg (25 mg Oral Given 12/03/20 1733)    ED Course  I have reviewed the triage vital signs and the nursing notes.  Pertinent labs & imaging results that were available during my care of the patient were reviewed by me and considered in my medical decision making (see chart for details).    MDM Rules/Calculators/A&P 45 year old female who presents emergency department with rash.  Unclear etiology of new rash however she has no new changes to medicines, environment, food, detergents or soaps.  Clinical picture is complicated with history of active cancer and radiation treatment.  Spoke with Dr. Roderic Palau, attending, who went to assess patient's rash.  He agrees that lab work is likely not indicated at this time.  The patient has no signs  of angioedema or anaphylaxis.  Her history and exam is not consistent with a dangerous etiology of rash such as SJS or TEN at this time.  She has no mucosal involvement.  The rash does not appear to be petechial or purpuric.  There is no target lesion concerning for rickettsial infection.  She has no joint involvement.  No fever or report of fever over the past few days and no systemic signs of infection.  She states Benadryl is not working at home.  I have given her a dose of Atarax here in the emergency department and will discharge her with a prescription for Atarax.  I have instructed her to follow-up on Monday with  her oncologist regarding the rash.  She is agreeable to this plan at this time.  She understands with teach back to return to the emergency department if she begins to have swelling of her lips or tongue or difficulty breathing. Final Clinical Impression(s) / ED Diagnoses Final diagnoses:  Rash    Rx / DC Orders ED Discharge Orders          Ordered    hydrOXYzine (ATARAX/VISTARIL) 25 MG tablet  Every 6 hours        12/03/20 1758             Mickie Hillier, PA-C 12/03/20 Barth Kirks, MD 12/03/20 2255

## 2020-12-03 NOTE — Discharge Instructions (Addendum)
You were seen in the emergency department today for a rash.  At this time it is unclear what the rash is coming from however given your history of cancer and recent radiation treatment its prudent for you to follow-up on Monday morning with your oncologist to have them assess your rash.  I have prescribed you Atarax which can be used for itching.  You may also continue to use Benadryl as needed.  Please return to the emergency department if you begin to have swelling of her lips or tongue or difficulty breathing.

## 2020-12-05 ENCOUNTER — Encounter: Payer: Self-pay | Admitting: Nurse Practitioner

## 2020-12-05 ENCOUNTER — Telehealth (HOSPITAL_BASED_OUTPATIENT_CLINIC_OR_DEPARTMENT_OTHER): Payer: Self-pay | Admitting: Family Medicine

## 2020-12-05 NOTE — Telephone Encounter (Signed)
Received a after hours fax from 10/29 stating pt has itchiness on arm stated pt was going to [facility] due to it being the closes to her. LVM for pt if she would like to make an appt with provider this morning offered virtual as well. Please advise.

## 2020-12-06 ENCOUNTER — Other Ambulatory Visit: Payer: Self-pay | Admitting: Radiology

## 2020-12-08 ENCOUNTER — Ambulatory Visit (HOSPITAL_COMMUNITY)
Admission: RE | Admit: 2020-12-08 | Discharge: 2020-12-08 | Disposition: A | Discharge status: Home / Self Care | Payer: BC Managed Care – PPO | Source: Ambulatory Visit | Attending: Interventional Radiology | Admitting: Interventional Radiology

## 2020-12-08 ENCOUNTER — Other Ambulatory Visit: Payer: Self-pay

## 2020-12-08 ENCOUNTER — Other Ambulatory Visit (HOSPITAL_COMMUNITY): Payer: Self-pay | Admitting: Interventional Radiology

## 2020-12-08 ENCOUNTER — Encounter (HOSPITAL_COMMUNITY)
Admission: RE | Admit: 2020-12-08 | Discharge: 2020-12-08 | Disposition: A | Discharge status: Home / Self Care | Payer: BC Managed Care – PPO | Source: Ambulatory Visit | Attending: Interventional Radiology | Admitting: Interventional Radiology

## 2020-12-08 ENCOUNTER — Encounter (HOSPITAL_COMMUNITY): Payer: Self-pay

## 2020-12-08 DIAGNOSIS — C189 Malignant neoplasm of colon, unspecified: Secondary | ICD-10-CM

## 2020-12-08 DIAGNOSIS — C787 Secondary malignant neoplasm of liver and intrahepatic bile duct: Secondary | ICD-10-CM | POA: Insufficient documentation

## 2020-12-08 DIAGNOSIS — C182 Malignant neoplasm of ascending colon: Secondary | ICD-10-CM | POA: Diagnosis not present

## 2020-12-08 DIAGNOSIS — Z7984 Long term (current) use of oral hypoglycemic drugs: Secondary | ICD-10-CM | POA: Insufficient documentation

## 2020-12-08 HISTORY — PX: IR ANGIOGRAM SELECTIVE EACH ADDITIONAL VESSEL: IMG667

## 2020-12-08 HISTORY — PX: IR US GUIDE VASC ACCESS RIGHT: IMG2390

## 2020-12-08 HISTORY — PX: IR ANGIOGRAM VISCERAL SELECTIVE: IMG657

## 2020-12-08 HISTORY — PX: IR EMBO TUMOR ORGAN ISCHEMIA INFARCT INC GUIDE ROADMAPPING: IMG5449

## 2020-12-08 LAB — COMPREHENSIVE METABOLIC PANEL
ALT: 39 U/L (ref 0–44)
AST: 36 U/L (ref 15–41)
Albumin: 3.6 g/dL (ref 3.5–5.0)
Alkaline Phosphatase: 138 U/L — ABNORMAL HIGH (ref 38–126)
Anion gap: 6 (ref 5–15)
BUN: 17 mg/dL (ref 6–20)
CO2: 24 mmol/L (ref 22–32)
Calcium: 8.8 mg/dL — ABNORMAL LOW (ref 8.9–10.3)
Chloride: 105 mmol/L (ref 98–111)
Creatinine, Ser: 0.62 mg/dL (ref 0.44–1.00)
GFR, Estimated: >60 mL/min (ref >60)
Glucose, Bld: 244 mg/dL — ABNORMAL HIGH (ref 70–99)
Potassium: 3.7 mmol/L (ref 3.5–5.1)
Sodium: 135 mmol/L (ref 135–145)
Total Bilirubin: 0.5 mg/dL (ref 0.3–1.2)
Total Protein: 7 g/dL (ref 6.5–8.1)

## 2020-12-08 LAB — CBC WITH DIFFERENTIAL/PLATELET
Abs Immature Granulocytes: 0.01 10*3/uL (ref 0.00–0.07)
Basophils Absolute: 0 10*3/uL (ref 0.0–0.1)
Basophils Relative: 0 %
Eosinophils Absolute: 0.1 10*3/uL (ref 0.0–0.5)
Eosinophils Relative: 2 %
HCT: 38.2 % (ref 36.0–46.0)
Hemoglobin: 11.9 g/dL — ABNORMAL LOW (ref 12.0–15.0)
Immature Granulocytes: 0 %
Lymphocytes Relative: 18 %
Lymphs Abs: 0.5 10*3/uL — ABNORMAL LOW (ref 0.7–4.0)
MCH: 23.9 pg — ABNORMAL LOW (ref 26.0–34.0)
MCHC: 31.2 g/dL (ref 30.0–36.0)
MCV: 76.9 fL — ABNORMAL LOW (ref 80.0–100.0)
Monocytes Absolute: 0.2 10*3/uL (ref 0.1–1.0)
Monocytes Relative: 8 %
Neutro Abs: 2.1 10*3/uL (ref 1.7–7.7)
Neutrophils Relative %: 72 %
Platelets: 133 10*3/uL — ABNORMAL LOW (ref 150–400)
RBC: 4.97 MIL/uL (ref 3.87–5.11)
RDW: 15.7 % — ABNORMAL HIGH (ref 11.5–15.5)
WBC: 3 10*3/uL — ABNORMAL LOW (ref 4.0–10.5)
nRBC: 0 % (ref 0.0–0.2)

## 2020-12-08 LAB — GLUCOSE, CAPILLARY: Glucose-Capillary: 268 mg/dL — ABNORMAL HIGH (ref 70–99)

## 2020-12-08 LAB — PROTIME-INR
INR: 1 (ref 0.8–1.2)
Prothrombin Time: 13.3 seconds (ref 11.4–15.2)

## 2020-12-08 MED ORDER — DIPHENHYDRAMINE HCL 50 MG/ML IJ SOLN
INTRAMUSCULAR | Status: AC
  Filled 2020-12-08: qty 1

## 2020-12-08 MED ORDER — DIPHENHYDRAMINE HCL 50 MG/ML IJ SOLN
INTRAMUSCULAR | Status: AC | PRN
  Administered 2020-12-08: 25 mg via INTRAVENOUS

## 2020-12-08 MED ORDER — IOHEXOL 300 MG/ML  SOLN
100.0000 mL | Freq: Once | INTRAMUSCULAR | Status: AC | PRN
  Administered 2020-12-08: 40 mL via INTRA_ARTERIAL

## 2020-12-08 MED ORDER — MIDAZOLAM HCL 2 MG/2ML IJ SOLN
INTRAMUSCULAR | Status: AC | PRN
  Administered 2020-12-08 (×4): 1 mg via INTRAVENOUS

## 2020-12-08 MED ORDER — FENTANYL CITRATE (PF) 100 MCG/2ML IJ SOLN
INTRAMUSCULAR | Status: AC
  Filled 2020-12-08: qty 2

## 2020-12-08 MED ORDER — YTTRIUM 90 INJECTION: 33.3000 | INJECTION | Freq: Once | INTRAVENOUS | Status: DC

## 2020-12-08 MED ORDER — SODIUM CHLORIDE 0.9 % IV SOLN
8.0000 mg | Freq: Once | INTRAVENOUS | Status: AC
  Administered 2020-12-08: 8 mg via INTRAVENOUS
  Filled 2020-12-08: qty 8

## 2020-12-08 MED ORDER — IOHEXOL 300 MG/ML  SOLN
100.0000 mL | Freq: Once | INTRAMUSCULAR | Status: AC | PRN
  Administered 2020-12-08: 10 mL via INTRAVENOUS

## 2020-12-08 MED ORDER — LIDOCAINE-EPINEPHRINE (PF) 1 %-1:200000 IJ SOLN
INTRAMUSCULAR | Status: AC | PRN
  Administered 2020-12-08: 10 mL

## 2020-12-08 MED ORDER — DEXAMETHASONE SODIUM PHOSPHATE 10 MG/ML IJ SOLN
8.0000 mg | Freq: Once | INTRAMUSCULAR | Status: AC
  Administered 2020-12-08: 8 mg via INTRAVENOUS
  Filled 2020-12-08: qty 1

## 2020-12-08 MED ORDER — PANTOPRAZOLE SODIUM 40 MG IV SOLR
40.0000 mg | Freq: Once | INTRAVENOUS | Status: AC
  Administered 2020-12-08: 40 mg via INTRAVENOUS
  Filled 2020-12-08: qty 40

## 2020-12-08 MED ORDER — HEPARIN SOD (PORK) LOCK FLUSH 100 UNIT/ML IV SOLN
500.0000 [IU] | Freq: Once | INTRAVENOUS | Status: AC
  Administered 2020-12-08: 500 [IU] via INTRAVENOUS
  Filled 2020-12-08: qty 5

## 2020-12-08 MED ORDER — HEPARIN SOD (PORK) LOCK FLUSH 100 UNIT/ML IV SOLN
INTRAVENOUS | Status: AC
  Filled 2020-12-08: qty 5

## 2020-12-08 MED ORDER — MIDAZOLAM HCL 2 MG/2ML IJ SOLN
INTRAMUSCULAR | Status: AC
  Filled 2020-12-08: qty 4

## 2020-12-08 MED ORDER — LIDOCAINE HCL 1 % IJ SOLN
INTRAMUSCULAR | Status: AC
  Filled 2020-12-08: qty 20

## 2020-12-08 MED ORDER — FENTANYL CITRATE (PF) 100 MCG/2ML IJ SOLN
INTRAMUSCULAR | Status: AC | PRN
  Administered 2020-12-08 (×2): 50 ug via INTRAVENOUS

## 2020-12-08 MED ORDER — SODIUM CHLORIDE 0.9 % IV SOLN: INTRAVENOUS | Status: DC

## 2020-12-08 MED ORDER — CEFAZOLIN SODIUM-DEXTROSE 2-4 GM/100ML-% IV SOLN
2.0000 g | INTRAVENOUS | Status: AC
  Administered 2020-12-08: 2 g via INTRAVENOUS
  Filled 2020-12-08: qty 100

## 2020-12-08 NOTE — H&P (Signed)
Referring Physician(s): Sherrill,B  Supervising Physician: Sandi Mariscal  Patient Status:  [facility]  Chief Complaint:  Metastatic colon cancer to liver  Subjective: Patient familiar to IR service from right pelvic cyst aspiration 2018, left liver lesion biopsy in 2021 ,Port-A-Cath placement in 2021 and Y-90 radioembolization of lateral segment of left lobe liver on 11/17/2020.  She has a history of metastatic colon cancer involving the ascending colon, status post laparoscopic right colectomy on 08/11/2013. She has undergone prior multiple rounds of chemotherapy and has also been evaluated by surgical oncology at [facility] but has been deemed not a candidate for liver resection, hepatic infusion pump or ablation. She  presents again today for additional Y -90 radioembolization of medial segment of left lobe of liver and right lobe of liver due to residual disease. She currently denies fever, headache, chest pain, dyspnea, cough, abdominal/back pain, nausea, vomiting or bleeding.  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   Restless leg syndrome    Seizures ([place])    last seizure 08/2012   Past Surgical History:  Procedure Laterality Date   ABDOMINAL HYSTERECTOMY  08/24/2004   partial   APPENDECTOMY  08/24/2004   COLON SURGERY  08/11/2013   removed a foot of colon   COLONOSCOPY  2019   colonoscopy 10-18-14     COLONOSCOPY WITH PROPOFOL  07/07/2013   CYSTOSCOPY  11/01/2003   ENDOMETRIAL FULGURATION  11/01/2003   ESOPHAGOGASTRODUODENOSCOPY  07/07/2013   IR ANGIOGRAM SELECTIVE EACH ADDITIONAL VESSEL  11/03/2020   IR  ANGIOGRAM SELECTIVE EACH ADDITIONAL VESSEL  11/03/2020   IR ANGIOGRAM SELECTIVE EACH ADDITIONAL VESSEL  11/03/2020   IR ANGIOGRAM SELECTIVE EACH ADDITIONAL VESSEL  11/03/2020   IR ANGIOGRAM SELECTIVE EACH ADDITIONAL VESSEL  11/17/2020   IR ANGIOGRAM SELECTIVE EACH ADDITIONAL VESSEL  11/17/2020   IR ANGIOGRAM SELECTIVE EACH ADDITIONAL VESSEL  11/17/2020   IR ANGIOGRAM VISCERAL SELECTIVE  11/03/2020   IR ANGIOGRAM VISCERAL SELECTIVE  11/03/2020   IR ANGIOGRAM VISCERAL SELECTIVE  11/17/2020   IR EMBO ARTERIAL NOT HEMORR HEMANG INC GUIDE ROADMAPPING  11/03/2020   IR EMBO TUMOR ORGAN ISCHEMIA INFARCT INC GUIDE ROADMAPPING  11/17/2020   IR IMAGING GUIDED PORT INSERTION  03/05/2019   IR RADIOLOGIST EVAL & MGMT  10/06/2020   IR US GUIDE VASC ACCESS RIGHT  11/03/2020   IR US GUIDE VASC ACCESS RIGHT  11/17/2020   LAPAROSCOPIC LYSIS OF ADHESIONS  11/01/2003   LAPAROSCOPIC PARTIAL COLECTOMY Right 08/11/2013   Procedure: LAPAROSCOPIC RIGHT HEMICOLECTOMY;  Surgeon: Stark Klein, MD;  Location: [place];  Service: General;  Laterality: Right;   LAPAROTOMY N/A 12/28/2014   Procedure: EXPLORATORY LAPAROTOMY;  Surgeon: Everitt Amber, MD;  Location: [facility];  Service: Gynecology;  Laterality: N/A;   LEFT OOPHORECTOMY  08/24/2004   PANNICULECTOMY  08/24/2004   PORT-A-CATH REMOVAL Left 06/08/2014   Procedure: REMOVAL PORT-A-CATH;  Surgeon: Stark Klein, MD;  Location: [place];  Service: General;  Laterality: Left;   PORTACATH PLACEMENT Left 09/09/2013   Procedure: INSERTION PORT-A-CATH;  Surgeon: Stark Klein,  MD;  Location: [place];  Service: General;  Laterality: Left;   SALPINGOOPHORECTOMY Right 12/28/2014   Procedure: RIGHT SALPINGO OOPHORECTOMY;  Surgeon: Everitt Amber, MD;  [facility];  Service: Gynecology;  Laterality: Right;   TUBAL LIGATION  11/07/2000   UNILATERAL SALPINGECTOMY Left 08/24/2004   WISDOM TOOTH EXTRACTION Bilateral 1994     Allergies: Adhesive [tape], Clindamycin/lincomycin,  Promethazine, Eggs or egg-derived products, Metoclopramide hcl, Oxycodone-acetaminophen, Sulfa antibiotics, Levaquin [levofloxacin], and Penicillins  Medications: Prior to Admission medications   Medication Sig Start Date End Date Taking? Authorizing Provider  diphenhydrAMINE (BENADRYL) 50 MG tablet Take 100 mg by mouth at bedtime as needed for itching or sleep.   Yes [provider]  lacosamide (VIMPAT) 200 MG TABS tablet Take 1 tablet (200 mg total) by mouth 2 (two) times daily. 11/18/20  Yes [name], DO  LORazepam (ATIVAN) 0.5 MG tablet Take 1 tablet by mouth twice daily as needed for anxiety 11/01/20  Yes [name], MD  metFORMIN (GLUCOPHAGE) 500 MG tablet Take 1 tablet by mouth twice daily with food for 7 days.  Then increase to 2 tablets with food in the morning and 1 tablet with food in the evening for 7 days.  Then increase to 2 tablets by mouth twice daily with food. 06/02/20  Yes [name], Raymond J, MD  omeprazole (PRILOSEC) 40 MG capsule Take 40 mg by mouth as needed.   Yes [provider]  prochlorperazine (COMPAZINE) 10 MG tablet Take 1 tablet (10 mg total) by mouth every 6 (six) hours as needed for nausea or vomiting. 12/10/19  Yes [name], MD  vitamin B-12 (CYANOCOBALAMIN) 1000 MCG tablet Take 1,000 mcg by mouth daily.   Yes [provider]  Accu-Chek Softclix Lancets lancets SMARTSIG:2 Topical Twice Daily 07/22/20   [provider]  acetaminophen (TYLENOL) 500 MG tablet Take 1,000 mg by mouth every 6 (six) hours as needed.    [provider]  blood glucose meter kit and supplies Dispense based on patient and insurance preference. Use up to four times daily as directed. (FOR ICD-10 E10.9, E11.9). 07/22/20   [name], [name], MD  diphenoxylate-atropine (LOMOTIL) 2.5-0.025 MG tablet TAKE 1- 2 TABLETS BY MOUTH 4 TIMES DAILY AS NEEDED FOR DIARRHEA /  LOOSE  STOOLS Patient not taking: No sig reported 08/23/20   [name],  MD  ferrous sulfate 325 (65 FE) MG tablet Take 325 mg by mouth daily with breakfast.    [provider]  hydrOXYzine (ATARAX/VISTARIL) 25 MG tablet Take 1 tablet (25 mg total) by mouth every 6 (six) hours. 12/03/20   Mickie Hillier, PA-C  lidocaine-prilocaine (EMLA) cream Apply 1 application topically as directed. Apply to port site 1 hour prior to stick and cover with plastic wrap 10/22/19   [name], MD  loperamide (IMODIUM) 2 MG capsule Take 2 mg by mouth as needed for diarrhea or loose stools. Patient not taking: No sig reported    [provider]  magnesium oxide (MAG-OX) 400 (240 Mg) MG tablet Take 1 tablet (400 mg total) by mouth 2 (two) times daily. Patient not taking: No sig reported 10/17/20   Owens Shark, NP  Polyethylene Glycol 3350 (MIRALAX PO) Take 17 g by mouth daily as needed.    [provider]     Vital Signs: BP (!) 151/84 (BP Location: Right Arm)   Pulse 78   Temp 98.2 F (36.8 C) (Oral)  Resp 15   SpO2 98%   Physical Exam awake, alert.  Chest clear to auscultation bilaterally.  Heart with regular rate and rhythm.  Abdomen soft, positive bowel sounds, nontender.  No lower extremity edema.  Imaging: No results found.  Labs:  CBC: Recent Labs    11/03/20 0810 11/17/20 0805 11/24/20 1054 12/08/20 0750  WBC 3.2* 3.4* 3.7* 3.0*  HGB 12.1 12.2 12.0 11.9*  HCT 39.4 39.3 38.5 38.2  PLT 144* 124* 151 133*    COAGS: Recent Labs    11/03/20 0810 11/17/20 0805 12/08/20 0750  INR 1.1 1.1 1.0    BMP: Recent Labs    11/03/20 0810 11/17/20 0805 11/24/20 1054 12/08/20 0750  NA 135 135 135 135  K 4.0 4.0 3.9 3.7  CL 105 103 100 105  CO2 25 24 27 24   GLUCOSE 333* 311* 290* 244*  BUN 16 18 14 17   CALCIUM 9.4 9.1 9.5 8.8*  CREATININE 0.78 0.62 0.60 0.62  GFRNONAA >60 >60 >60 >60    LIVER FUNCTION TESTS: Recent Labs    11/03/20 0810 11/17/20 0805 11/24/20 1054 12/08/20 0750  BILITOT 0.6 0.8 0.6 0.5  AST 65*  49* 51* 36  ALT 89* 68* 59* 39  ALKPHOS 138* 194* 166* 138*  PROT 7.2 7.2 7.2 7.0  ALBUMIN 3.6 3.8 3.9 3.6    Assessment and Plan: atient familiar to IR service from right pelvic cyst aspiration 2018, left liver lesion biopsy in 2021 ,Port-A-Cath placement in 2021 and Y-90 radioembolization of lateral segment of left lobe liver on 11/17/2020.  She has a history of metastatic colon cancer involving the ascending colon, status post laparoscopic right colectomy on 08/11/2013. She has undergone prior multiple rounds of chemotherapy and has also been evaluated by surgical oncology at [facility] but has been deemed not a candidate for liver resection, hepatic infusion pump or ablation. She  presents again today for additional Y -90 radioembolization of medial segment of left lobe of liver and right lobe of liver due to residual disease. Risks and benefits of procedure were discussed with the patient including, but not limited to bleeding, infection, vascular injury or contrast induced renal failure.  This interventional procedure involves the use of X-rays and because of the nature of the planned procedure, it is possible that we will have prolonged use of X-ray fluoroscopy.  Potential radiation risks to you include (but are not limited to) the following: - A slightly elevated risk for cancer  several years later in life. This risk is typically less than 0.5% percent. This risk is low in comparison to the normal incidence of human cancer, which is 33% for women and 50% for men according to the [place]. - Radiation induced injury can include skin redness, resembling a rash, tissue breakdown / ulcers and hair loss (which can be temporary or permanent).   The likelihood of either of these occurring depends on the difficulty of the procedure and whether you are sensitive to radiation due to previous procedures, disease, or genetic conditions.   IF your procedure requires a prolonged use of  radiation, you will be notified and given written instructions for further action.  It is your responsibility to monitor the irradiated area for the 2 weeks following the procedure and to notify your physician if you are concerned that you have suffered a radiation induced injury.    All of the patient's questions were answered, patient is agreeable to proceed.  Consent signed and in chart.  Electronically Signed: D. Rowe Robert, PA-C 12/08/2020, 9:00 AM   I spent a total of 25 Minutes at the the patient's bedside AND on the patient's hospital floor or unit, greater than 50% of which was counseling/coordinating care for visceral/hepatic arteriogram with hepatic Y-90 radioembolization

## 2020-12-08 NOTE — Progress Notes (Signed)
Notified Dr Pascal Lux of [facility].

## 2020-12-08 NOTE — Procedures (Signed)
Pre-procedure Diagnosis: Metastatic colon cancer Post-procedure Diagnosis: Same  Post Y-90 radioembolization of the right lobe and medial segment of the left lobe of the liver.    Complications: None Immediate EBL: None  Keep right leg straight for 4 hrs (until 1500).    Signed: Sandi Mariscal Pager: [phone] 12/08/2020, 11:20 AM

## 2020-12-08 NOTE — Discharge Instructions (Signed)
Urgent needs - Interventional Radiology on call MD [phone]  Wound - May remove dressing and shower in 24 to 48 hours.  Keep site clean and dry.  Replace with bandaid as needed.  Do not submerge in tub or water until site healing well. If closed with glue, glue will flake off on its own.  Follow up with primary care provider regarding elevated Blood Glucose.

## 2020-12-08 NOTE — Sedation Documentation (Signed)
In nuc med for study ?

## 2020-12-12 ENCOUNTER — Other Ambulatory Visit: Payer: Self-pay | Admitting: Oncology

## 2020-12-13 ENCOUNTER — Encounter: Payer: Self-pay | Admitting: Oncology

## 2020-12-19 ENCOUNTER — Other Ambulatory Visit: Payer: Self-pay | Admitting: Interventional Radiology

## 2020-12-19 ENCOUNTER — Other Ambulatory Visit: Payer: Self-pay | Admitting: *Deleted

## 2020-12-19 ENCOUNTER — Encounter: Payer: Self-pay | Admitting: Nurse Practitioner

## 2020-12-19 DIAGNOSIS — C189 Malignant neoplasm of colon, unspecified: Secondary | ICD-10-CM

## 2020-12-19 DIAGNOSIS — C787 Secondary malignant neoplasm of liver and intrahepatic bile duct: Secondary | ICD-10-CM

## 2020-12-20 ENCOUNTER — Encounter: Payer: Self-pay | Admitting: *Deleted

## 2020-12-20 ENCOUNTER — Other Ambulatory Visit: Payer: Self-pay | Admitting: *Deleted

## 2020-12-20 DIAGNOSIS — C787 Secondary malignant neoplasm of liver and intrahepatic bile duct: Secondary | ICD-10-CM

## 2020-12-20 MED ORDER — PROCHLORPERAZINE MALEATE 10 MG PO TABS: 10.0000 mg | ORAL_TABLET | Freq: Four times a day (QID) | ORAL | 2 refills | Status: DC | PRN

## 2020-12-20 NOTE — Progress Notes (Signed)
Compazine refill sent to [facility].

## 2020-12-22 ENCOUNTER — Inpatient Hospital Stay: Payer: BC Managed Care – PPO | Attending: Oncology | Admitting: Oncology

## 2020-12-22 ENCOUNTER — Inpatient Hospital Stay: Payer: BC Managed Care – PPO

## 2020-12-22 ENCOUNTER — Other Ambulatory Visit: Payer: Self-pay

## 2020-12-22 VITALS — BP 149/87 | HR 91 | Temp 98.1°F | Resp 18 | Ht 66.0 in | Wt 163.0 lb

## 2020-12-22 DIAGNOSIS — D509 Iron deficiency anemia, unspecified: Secondary | ICD-10-CM | POA: Diagnosis not present

## 2020-12-22 DIAGNOSIS — C182 Malignant neoplasm of ascending colon: Secondary | ICD-10-CM

## 2020-12-22 DIAGNOSIS — C787 Secondary malignant neoplasm of liver and intrahepatic bile duct: Secondary | ICD-10-CM | POA: Insufficient documentation

## 2020-12-22 DIAGNOSIS — G620 Drug-induced polyneuropathy: Secondary | ICD-10-CM | POA: Diagnosis not present

## 2020-12-22 DIAGNOSIS — R21 Rash and other nonspecific skin eruption: Secondary | ICD-10-CM | POA: Diagnosis not present

## 2020-12-22 DIAGNOSIS — D6959 Other secondary thrombocytopenia: Secondary | ICD-10-CM | POA: Insufficient documentation

## 2020-12-22 LAB — CBC WITH DIFFERENTIAL (CANCER CENTER ONLY)
Abs Immature Granulocytes: 0.02 10*3/uL (ref 0.00–0.07)
Basophils Absolute: 0 10*3/uL (ref 0.0–0.1)
Basophils Relative: 0 %
Eosinophils Absolute: 0 10*3/uL (ref 0.0–0.5)
Eosinophils Relative: 1 %
HCT: 36.5 % (ref 36.0–46.0)
Hemoglobin: 11.4 g/dL — ABNORMAL LOW (ref 12.0–15.0)
Immature Granulocytes: 1 %
Lymphocytes Relative: 12 %
Lymphs Abs: 0.3 10*3/uL — ABNORMAL LOW (ref 0.7–4.0)
MCH: 23.7 pg — ABNORMAL LOW (ref 26.0–34.0)
MCHC: 31.2 g/dL (ref 30.0–36.0)
MCV: 75.9 fL — ABNORMAL LOW (ref 80.0–100.0)
Monocytes Absolute: 0.3 10*3/uL (ref 0.1–1.0)
Monocytes Relative: 9 %
Neutro Abs: 2.1 10*3/uL (ref 1.7–7.7)
Neutrophils Relative %: 77 %
Platelet Count: 130 10*3/uL — ABNORMAL LOW (ref 150–400)
RBC: 4.81 MIL/uL (ref 3.87–5.11)
RDW: 16.8 % — ABNORMAL HIGH (ref 11.5–15.5)
WBC Count: 2.8 10*3/uL — ABNORMAL LOW (ref 4.0–10.5)
nRBC: 0 % (ref 0.0–0.2)

## 2020-12-22 LAB — CMP (CANCER CENTER ONLY)
ALT: 28 U/L (ref 0–44)
AST: 31 U/L (ref 15–41)
Albumin: 3.9 g/dL (ref 3.5–5.0)
Alkaline Phosphatase: 128 U/L — ABNORMAL HIGH (ref 38–126)
Anion gap: 10 (ref 5–15)
BUN: 19 mg/dL (ref 6–20)
CO2: 26 mmol/L (ref 22–32)
Calcium: 10.1 mg/dL (ref 8.9–10.3)
Chloride: 101 mmol/L (ref 98–111)
Creatinine: 0.56 mg/dL (ref 0.44–1.00)
GFR, Estimated: >60 mL/min (ref >60)
Glucose, Bld: 280 mg/dL — ABNORMAL HIGH (ref 70–99)
Potassium: 3.6 mmol/L (ref 3.5–5.1)
Sodium: 137 mmol/L (ref 135–145)
Total Bilirubin: 0.9 mg/dL (ref 0.3–1.2)
Total Protein: 7 g/dL (ref 6.5–8.1)

## 2020-12-22 LAB — MAGNESIUM: Magnesium: 1.4 mg/dL — ABNORMAL LOW (ref 1.7–2.4)

## 2020-12-22 NOTE — Progress Notes (Signed)
[place] OFFICE PROGRESS NOTE   Diagnosis: Colon cancer  INTERVAL HISTORY:   Ms. Stlouis returns as scheduled.  She underwent Y 90 therapy to the right hepatic lobe on 12/08/2020.  She reports developing malaise following the procedure.  The malaise has persisted.  She fell getting out of bed approximately 10 days ago and hit her left chest on the nightstand.  The left chest wall remains sore.  And ecchymosis is healing.  No other complaint.  Objective:  Vital signs in last 24 hours:  Blood pressure (!) 149/87, pulse 91, temperature 98.1 F (36.7 C), temperature source Oral, resp. rate 18, height _0  (1.676 m), weight 163 lb (73.9 kg), SpO2 99 %.   Resp: Lungs clear bilaterally Cardio: Regular rate and rhythm GI: No hepatosplenomegaly, no mass Vascular: No leg edema  Skin: No rash over the face, resolving ecchymosis at the lateral upper left breast/chest wall  Portacath/PICC-without erythema  Lab Results:  Lab Results  Component Value Date   WBC 2.8 (L) 12/22/2020   HGB 11.4 (L) 12/22/2020   HCT 36.5 12/22/2020   MCV 75.9 (L) 12/22/2020   PLT 130 (L) 12/22/2020   NEUTROABS 2.1 12/22/2020    CMP  Lab Results  Component Value Date   NA 137 12/22/2020   K 3.6 12/22/2020   CL 101 12/22/2020   CO2 26 12/22/2020   GLUCOSE 280 (H) 12/22/2020   BUN 19 12/22/2020   CREATININE 0.56 12/22/2020   CALCIUM 10.1 12/22/2020   PROT 7.0 12/22/2020   ALBUMIN 3.9 12/22/2020   AST 31 12/22/2020   ALT 28 12/22/2020   ALKPHOS 128 (H) 12/22/2020   BILITOT 0.9 12/22/2020   GFRNONAA >60 12/22/2020   GFRAA >60 11/05/2019    Lab Results  Component Value Date   CEA1 43.43 (H) 06/30/2020   CEA 117.27 (H) 10/17/2020   CA125 14 11/26/2014    Medications: I have reviewed the patient's current medications.   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 The tumor returned microsatellite stable with no loss of mismatch repair protein expression   APC mutated. No BRAF, KRAS, or NRAS mutation On Foundation 1 testing   Cycle 1 adjuvant FOLFOX 09/08/2013   Cycle 2 adjuvant FOLFOX 09/24/2013   Cycle 3 adjuvant FOLFOX 10/08/2013.   Cycle 4 adjuvant FOLFOX 10/22/2013.   Cycle 5 adjuvant FOLFOX 11/05/2013. Oxaliplatin held due to thrombocytopenia. Cycle 6 FOLFOX 11/19/2013. Cycle 7 FOLFOX 12/03/2013. Oxaliplatin held secondary to thrombocytopenia. Cycle 8 FOLFOX 12/17/2013. Cycle 9 FOLFOX 01/04/2014. Oxaliplatin held secondary to neutropenia.   Cycle 10 FOLFOX 01/21/2014. Oxaliplatin held secondary to thrombocytopenia. Cycle 11 FOLFOX 02/04/2014 Cycle 12 FOLFOX 02/18/2014, oxaliplatin dose reduced secondary tothrombocytopenia CT abdomen/pelvis 01/30/2014 revealed splenomegaly and no evidence of recurrent colon cancer CT chest 04/07/2014 with a stable right lower lobe nodule and no evidence for metastatic disease, no nodules seen on the CT 11/26/2014 Markedly elevated CEA 11/24/2014 CT 11/26/2014 revealed a right pelvic mass, splenomegaly, small volume ascites Right salpingo-oophorectomy 12/28/2014 with the pathology confirming metastatic colon cancer CTs 03/23/2016-no evidence of recurrent or metastatic disease. CT 11/26/2016-enlargement of a fluid density structure the right pelvic sidewall, no other evidence of metastatic disease CT aspiration right pelvic cyst 12/19/2016.  Cytology-BENIGN REACTIVE/REPARATIVE CHANGES. CTs 06/05/2017- no evidence of metastatic disease, mild cirrhotic changes with splenomegaly CTs 12/11/2017- recurrent cystic right adnexal mass, similar to on the CT 11/26/2016, stable mild splenomegaly CTs 04/23/2018-  enlargement of cystic right adnexal mass with mural nodularity, no other evidence of metastatic disease Cytoreductive surgery/HIPEC with mitomycin by Dr. Clovis Riley at [facility] 08/12/2018-R1 resection achieved.   Cytoreduction included omentectomy, LAR, right salpingo-oophorectomy and left colonic gutter/pelvic stripping.  Pathology on the rectum showed recurrent/metastatic adenocarcinoma, tumor 2.0 cm, predominantly involving the subserosa and muscularis propria of the colon, proximal and distal margins of resection were negative, vascular invasion present, metastatic carcinoma present in 1 out of 5 lymph nodes; omentum resection with no malignancy seen, no metastatic carcinoma identified in 1 lymph node examined; left gutter stripping positive metastatic adenocarcinoma; right ovary resection positive metastatic adenocarcinoma. 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 CEA 68 02/03/2019 Biopsy liver lesion 02/11/2019-metastatic adenocarcinoma consistent with primary colonic adenocarcinoma CTs 02/27/2019-multiple liver lesions increased in size, new lesion in the lateral right lobe of the liver.  No evidence of metastatic disease in the chest.  Redemonstrated moderate left hydronephrosis and proximal hydroureter without discrete lesion or obstructing etiology at the transition point of the mid ureter. Cycle 1 FOLFIRI/Panitumumab 03/12/2019 Cycle 2 FOLFIRI/Panitumumab 03/26/2019-bolus 5-FU and irinotecan held secondary to neutropenia Cycle 3 FOLFIRI/Panitumumab 04/09/2019, Udenyca added-not given secondary to seizure/discontinuation of the 5-FU pump CT [facility] 05/04/2019-no residual fluid collection at the left abdominal wall abscess, stable moderate left hydronephrosis, multifocal indeterminate liver lesions--liver lesions significantly improved Cycle 4 FOLFIRI/Panitumumab 05/07/2019, Udenyca Cycle 5 FOLFIRI/Panitumumab 05/20/2019, Udenyca Cycle 6 FOLFIRI/Panitumumab 06/18/2019, Udenyca Cycle 7 FOLFIRI/Panitumumab 07/01/2019 (Irinotecan dose reduced due to thrombocytopenia), Udenyca--Udenyca was not given Cycle 8  FOLFIRI/Panitumumab 07/15/2019, Udenyca Cycle 9 FOLFIRI/Panitumumab 07/29/2019, Udenyca Cycle 10 FOLFIRI/Panitumumab 08/13/2019, Udenyca CTs at [facility] 08/19/2019-decreased size of the hypodense and hyperdense lesions within segment 2 of the left hepatic lobe.  No new lesions identified.  Similar presacral soft tissue thickening with adjacent stable alignment in the rectum.  Improved but persistent left UPJ obstruction with mild left hydronephrosis.  Persistent but decreased size of the wound within the left mid abdomen abdominal wall. Cycle 11 irinotecan/Panitumumab 08/27/2019, Udenyca Cycle 12 irinotecan/Panitumumab 10/08/2019, Udenyca Cycle 13 irinotecan/Panitumumab 11/05/2019, Udenyca CT abdomen/pelvis 11/19/2019-no new or progressive interval findings.  Stable appearance of the calcified lesion in the anterior left hepatic dome with second tiny low-density lesion in the dome of the lateral segment left liver.  Both lesions are markedly decreased since 02/27/2019.  Stable mild fullness left intrarenal collecting system and renal pelvis.  Similar appearance of abnormal soft tissue in the left paramidline subcutaneous fat with associated skin thickening, this likely correlates to the fistula. Cycle 14 irinotecan/Panitumumab 11/19/2019, Udenyca Cycle 15 irinotecan/Panitumumab 12/02/2019, Udenyca CT abdomen[facility]pelvis at [facility] 02/11/2020- similar to slight decrease in segment 2 hypodense and hyperdense lesions (compared to a CT from July 2020), no new lesions, splenomegaly, no fluid collection or abscess, mild stranding adjacent to the incision with overlying skin thickening Cycle 16 irinotecan/Panitumumab 03/03/2020, Udenyca Cycle 17 irinotecan/Panitumumab 03/17/2020, Udenyca Cycle 18 irinotecan/panitumumab 04/07/2020, Udenyca Cycle 19 irinotecan/Panitumumab 04/28/2020, Udenyca Cycle 20 irinotecan/panitumumab 05/26/2020, Udenyca Cycle 21 irinotecan/panitumumab 06/30/2020, Udenyca CT abdomen/pelvis  07/19/2020-mildly increased hepatic steatosis.  Increased size of 2.8 cm hypervascular lesion segment 2 left hepatic lobe. Cycle 22 irinotecan/Panitumumab 07/21/2020, Udenyca MRI Abd 07/28/2020: liver lesions consistent with hepatic metastasis Cycle 23 irinotecan/Panitumumab 08/04/2020, Udenyca  Cycle 24 irinotecan/panitumumab 08/22/2020 Irinotecan/panitumumab discontinued secondary to diarrhea and skin toxicity Guardant 360 on 09/12/2020-K-ras G12V, PIK3CA,NTRK2-VUS, tumor mutation burden 6.85, MSI-high not detected Referred for consideration of Y 90 CTs 10/11/2020-for enlarging hepatic metastatic lesions identified.  No new lesions.  Several tiny nodules in the lungs in the 2 to 3 mm diameter range.  Mild splenomegaly.  Mild left hydronephrosis. 11/17/2020-Y90 lateral segment left lobe of the liver 12/08/2020-Y 90 treatment to the right liver Mild elevation of the CEA beginning January 2016 , normal on 05/19/2014   History of iron deficiency anemia   seizure disorder; seizure 04/09/2019.  Brain CT negative.  Now on Keppra. history of depression   4 mm right lower lobe nodule on a staging chest CT 09/08/2013 , stable on a CT 04/07/2014  Hospitalization 09/24/2013 through 09/26/2013 with fever and abdominal pain.   09/24/2013 urine culture positive for coag negative staph.   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  Scope advanced for approximately 25 cm.  Most proximal portion had necrotic appearing debris.  Colostomy bag insufflated suggesting some type of communication between the pouch and the proximal colon.  Introduction of scope into the ostomy found to available directions.  1 toward the distal pouch with similar appearing necrotic debris encountered.  The  other was about a 30 cm segment of normal-appearing colonic mucosa to the level of the previous right hemicolectomy ileocolonic anastomosis. Port-A-Cath placement interventional radiology 03/05/2019 Left leg/foot weakness-brain MRI 09/03/2019 with no evidence of metastatic disease, referred to physical therapy Possible abdominal wall abscess status post evaluation by surgery at [facility] 04/03/2019, antibiotics initiated; incision and drainage with purulent material removed 04/22/2019 CT at [facility] 05/04/2019-no fluid collection at the site of the left abdominal wall abscess, "indeterminate" liver lesions CT at [facility] 06/04/2019-persistent open wound of the left abdomen, 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  No malignancy.   16. COVID-19 + 09/10/2019 17. Hospitalized with seizure 09/17/2019 through 09/19/2019-MRI brain without evidence of metastatic disease. Seen by neurology. Keppra dose increased. 19.  Rash secondary to Panitumumab 20.  COVID-19 09/22/2020    Disposition: Ms. Rech has completed Y 90 to the right liver 2 weeks ago.  She developed malaise following procedure.  Hopefully the malaise will improve over the next several weeks.  She fell and hit the left chest wall on her nightstand.  She likely has a bruise to the chest wall or rib injury.  Hopefully this pain will improve over the next few weeks.  Systemic treatment for metastatic colon cancer remains on hold.  She will return for an office visit in 1 month.  We will plan for a restaging CT evaluation after the next visit.  I encouraged her to follow-up with Dr. [name] for treatment of diabetes.  Betsy Coder, MD  12/22/2020  10:23 AM

## 2020-12-26 ENCOUNTER — Telehealth: Payer: Self-pay | Admitting: *Deleted

## 2020-12-26 DIAGNOSIS — C182 Malignant neoplasm of ascending colon: Secondary | ICD-10-CM

## 2020-12-26 NOTE — Telephone Encounter (Addendum)
Informed her that Mg+ is low and MD recommends 2 grams IV Mg+ tomorrow or Wednesday. She reports she is not able to come due to working 10 am-7 pm tomorrow and [birth year] is her birthday. Could try to come in next week.  She has only been taking 400 mg Mg+ oral daily and agrees to increase to BID as ordered. Per Ned Card, NP: Re-check lab next week. Scheduling message sent

## 2020-12-26 NOTE — Telephone Encounter (Signed)
-----   Message from Owens Shark, NP sent at 12/26/2020 12:44 PM EST ----- Please let her know magnesium level was low on the blood work done last week.  Dr. Benay Spice recommends 2 g of IV magnesium tomorrow or Wednesday.  Let me know when we can get this scheduled and I will enter the orders.

## 2020-12-28 ENCOUNTER — Encounter: Payer: Self-pay | Admitting: Oncology

## 2020-12-28 ENCOUNTER — Telehealth: Payer: Self-pay | Admitting: Oncology

## 2020-12-28 NOTE — Telephone Encounter (Signed)
Scheduled appointment per 11/23 inbasket. Left message.

## 2021-01-02 ENCOUNTER — Other Ambulatory Visit: Payer: Self-pay | Admitting: Oncology

## 2021-01-02 ENCOUNTER — Other Ambulatory Visit: Payer: Self-pay | Admitting: Nurse Practitioner

## 2021-01-02 DIAGNOSIS — C182 Malignant neoplasm of ascending colon: Secondary | ICD-10-CM

## 2021-01-02 MED ORDER — LORAZEPAM 0.5 MG PO TABS: 0.5000 mg | ORAL_TABLET | Freq: Two times a day (BID) | ORAL | 0 refills | Status: DC | PRN

## 2021-01-03 ENCOUNTER — Inpatient Hospital Stay: Payer: BC Managed Care – PPO

## 2021-01-03 ENCOUNTER — Other Ambulatory Visit: Payer: Self-pay

## 2021-01-03 DIAGNOSIS — R21 Rash and other nonspecific skin eruption: Secondary | ICD-10-CM | POA: Diagnosis not present

## 2021-01-03 DIAGNOSIS — C182 Malignant neoplasm of ascending colon: Secondary | ICD-10-CM | POA: Diagnosis not present

## 2021-01-03 DIAGNOSIS — C787 Secondary malignant neoplasm of liver and intrahepatic bile duct: Secondary | ICD-10-CM | POA: Diagnosis not present

## 2021-01-03 DIAGNOSIS — D6959 Other secondary thrombocytopenia: Secondary | ICD-10-CM | POA: Diagnosis not present

## 2021-01-03 DIAGNOSIS — G620 Drug-induced polyneuropathy: Secondary | ICD-10-CM | POA: Diagnosis not present

## 2021-01-03 DIAGNOSIS — D509 Iron deficiency anemia, unspecified: Secondary | ICD-10-CM | POA: Diagnosis not present

## 2021-01-03 LAB — CMP (CANCER CENTER ONLY)
ALT: 26 U/L (ref 0–44)
AST: 32 U/L (ref 15–41)
Albumin: 4 g/dL (ref 3.5–5.0)
Alkaline Phosphatase: 128 U/L — ABNORMAL HIGH (ref 38–126)
Anion gap: 9 (ref 5–15)
BUN: 10 mg/dL (ref 6–20)
CO2: 26 mmol/L (ref 22–32)
Calcium: 10 mg/dL (ref 8.9–10.3)
Chloride: 101 mmol/L (ref 98–111)
Creatinine: 0.47 mg/dL (ref 0.44–1.00)
GFR, Estimated: >60 mL/min (ref >60)
Glucose, Bld: 258 mg/dL — ABNORMAL HIGH (ref 70–99)
Potassium: 4 mmol/L (ref 3.5–5.1)
Sodium: 136 mmol/L (ref 135–145)
Total Bilirubin: 1.3 mg/dL — ABNORMAL HIGH (ref 0.3–1.2)
Total Protein: 7.1 g/dL (ref 6.5–8.1)

## 2021-01-03 LAB — MAGNESIUM: Magnesium: 1.7 mg/dL (ref 1.7–2.4)

## 2021-01-04 DIAGNOSIS — C189 Malignant neoplasm of colon, unspecified: Secondary | ICD-10-CM | POA: Diagnosis not present

## 2021-01-08 ENCOUNTER — Ambulatory Visit
Admission: EM | Admit: 2021-01-08 | Discharge: 2021-01-08 | Disposition: A | Discharge status: Home / Self Care | Payer: BC Managed Care – PPO | Attending: Family Medicine | Admitting: Family Medicine

## 2021-01-08 ENCOUNTER — Encounter: Payer: Self-pay | Admitting: *Deleted

## 2021-01-08 ENCOUNTER — Other Ambulatory Visit: Payer: Self-pay

## 2021-01-08 DIAGNOSIS — R5381 Other malaise: Secondary | ICD-10-CM

## 2021-01-08 DIAGNOSIS — R5383 Other fatigue: Secondary | ICD-10-CM

## 2021-01-08 DIAGNOSIS — H9203 Otalgia, bilateral: Secondary | ICD-10-CM

## 2021-01-08 NOTE — ED Triage Notes (Signed)
C/O feeling "heavy" and "exhausted" over past week. 2 days ago started with HA and bilat ear pain and fatigue. Denies any cough, fevers, congestion.

## 2021-01-09 LAB — CBC WITH DIFFERENTIAL/PLATELET
Basophils Absolute: 0 10*3/uL (ref 0.0–0.2)
Basos: 0 %
EOS (ABSOLUTE): 0 10*3/uL (ref 0.0–0.4)
Eos: 1 %
Hematocrit: 37.9 % (ref 34.0–46.6)
Hemoglobin: 12.3 g/dL (ref 11.1–15.9)
Immature Grans (Abs): 0 10*3/uL (ref 0.0–0.1)
Immature Granulocytes: 0 %
Lymphocytes Absolute: 0.4 10*3/uL — ABNORMAL LOW (ref 0.7–3.1)
Lymphs: 14 %
MCH: 24.6 pg — ABNORMAL LOW (ref 26.6–33.0)
MCHC: 32.5 g/dL (ref 31.5–35.7)
MCV: 76 fL — ABNORMAL LOW (ref 79–97)
Monocytes Absolute: 0.2 10*3/uL (ref 0.1–0.9)
Monocytes: 7 %
Neutrophils Absolute: 2 10*3/uL (ref 1.4–7.0)
Neutrophils: 78 %
Platelets: 109 10*3/uL — ABNORMAL LOW (ref 150–450)
RBC: 5 x10E6/uL (ref 3.77–5.28)
RDW: 16.4 % — ABNORMAL HIGH (ref 11.7–15.4)
WBC: 2.6 10*3/uL — ABNORMAL LOW (ref 3.4–10.8)

## 2021-01-09 LAB — COMPREHENSIVE METABOLIC PANEL
ALT: 29 IU/L (ref 0–32)
AST: 35 IU/L (ref 0–40)
Albumin/Globulin Ratio: 1.5 (ref 1.2–2.2)
Albumin: 4 g/dL (ref 3.8–4.8)
Alkaline Phosphatase: 153 IU/L — ABNORMAL HIGH (ref 44–121)
BUN/Creatinine Ratio: 17 (ref 9–23)
BUN: 11 mg/dL (ref 6–24)
Bilirubin Total: 0.6 mg/dL (ref 0.0–1.2)
CO2: 21 mmol/L (ref 20–29)
Calcium: 10 mg/dL (ref 8.7–10.2)
Chloride: 101 mmol/L (ref 96–106)
Creatinine, Ser: 0.63 mg/dL (ref 0.57–1.00)
Globulin, Total: 2.6 g/dL (ref 1.5–4.5)
Glucose: 252 mg/dL — ABNORMAL HIGH (ref 70–99)
Potassium: 4.3 mmol/L (ref 3.5–5.2)
Sodium: 137 mmol/L (ref 134–144)
Total Protein: 6.6 g/dL (ref 6.0–8.5)
eGFR: 110 mL/min/{1.73_m2} (ref >59)

## 2021-01-09 LAB — COVID-19, FLU A+B NAA
Influenza A, NAA: NOT DETECTED
Influenza B, NAA: NOT DETECTED
SARS-CoV-2, NAA: NOT DETECTED

## 2021-01-12 NOTE — ED Provider Notes (Signed)
[facility]    CSN: [id number] Arrival date & time: 01/08/21  1240      History   Chief Complaint No chief complaint on file.   HPI RAZIYAH VANVLECK is a 45 y.o. female.   Presenting today with 1 week history of feeling exhausted, "heavy" and now 2 days of headache, ear pain b/l. Denies fever, chills, cough, CP, SOB, abdominal pain, N/V/D. Not trying anything OTC for sxs. Complicated medical history significant for metastatic cancer currently receiving radiation treatments, history of anemia.  .   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   Restless leg syndrome    Seizures ([place])    last seizure 08/2012    Patient Active Problem List   Diagnosis Date Noted   Diabetes ([place]) 06/02/2020   UTI (urinary tract infection) 06/02/2020   Liver metastases ([place])    Metastatic colon cancer to liver [facility])    Palliative care by specialist    COVID-19 virus infection 09/17/2019   Port-A-Cath in place 06/04/2019   Seizure ([place]) 04/10/2019   Goals of care, counseling/discussion 02/23/2019   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   Ileus ([place])    Metastatic malignant neoplasm ([place])    Genetic testing 11/13/2015   Sinusitis 01/29/2015   Pelvic mass in female 12/28/2014   Ovarian mass, right    Hypokalemia 02/03/2014   Nausea 12/20/2013   Fever 12/18/2013   Dehydration 12/18/2013   Diarrhea 10/28/2013   Malfunction of device 10/28/2013   Weight loss 10/28/2013   Mucositis (ulcerative) due to antineoplastic therapy 10/28/2013   Anemia due to antineoplastic chemotherapy 09/25/2013   Chemotherapy  induced thrombocytopenia 09/25/2013   Hypomagnesemia 09/25/2013   Hypophosphatemia 09/25/2013   Adverse drug effect 09/25/2013   Abdominal pain 09/24/2013   Cancer of ascending colon s/p right colectomy 08/11/2013 08/05/2013   Anemia, iron deficiency 01/28/2013   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[place]) 11/11/2012   Discoloration of skin of face 09/03/2012   Medication reaction 05/03/2012   Migraine 03/31/2012   Hyperglycemia 01/18/2012   Fatigue 12/12/2011   ADD (attention deficit disorder) 09/23/2010   Right wrist pain 05/01/2010   LIBIDO, DECREASED 04/07/2010   IRON DEFICIENCY 05/17/2009   ANA POSITIVE 05/17/2009   OVARIAN CYST 05/10/2009   FATTY LIVER DISEASE 03/24/2009   TRANSAMINASES, SERUM, ELEVATED 03/21/2009   Gastroparesis 08/16/2008   DEPRESSION/ANXIETY 09/19/2006   HYPERLIPIDEMIA 08/16/2006   RESTLESS LEG SYNDROME 08/16/2006   GERD 08/16/2006   IBS 08/16/2006    Past Surgical History:  Procedure Laterality Date   ABDOMINAL HYSTERECTOMY  08/24/2004   partial   APPENDECTOMY  08/24/2004   COLON SURGERY  08/11/2013   removed a foot of colon   COLONOSCOPY  2019   colonoscopy 10-18-14     COLONOSCOPY WITH PROPOFOL  07/07/2013   CYSTOSCOPY  11/01/2003   ENDOMETRIAL FULGURATION  11/01/2003   ESOPHAGOGASTRODUODENOSCOPY  07/07/2013   IR ANGIOGRAM SELECTIVE EACH ADDITIONAL VESSEL  11/03/2020   IR ANGIOGRAM SELECTIVE EACH ADDITIONAL VESSEL  11/03/2020   IR ANGIOGRAM SELECTIVE EACH ADDITIONAL VESSEL  11/03/2020   IR ANGIOGRAM SELECTIVE EACH ADDITIONAL VESSEL  11/03/2020   IR ANGIOGRAM SELECTIVE EACH ADDITIONAL VESSEL  11/17/2020   IR ANGIOGRAM SELECTIVE EACH ADDITIONAL VESSEL  11/17/2020   IR ANGIOGRAM SELECTIVE EACH ADDITIONAL VESSEL  11/17/2020   IR ANGIOGRAM SELECTIVE EACH ADDITIONAL VESSEL  12/08/2020   IR ANGIOGRAM SELECTIVE EACH ADDITIONAL VESSEL  12/08/2020   IR ANGIOGRAM VISCERAL SELECTIVE  11/03/2020   IR ANGIOGRAM VISCERAL SELECTIVE  11/03/2020   IR ANGIOGRAM VISCERAL  SELECTIVE  11/17/2020   IR ANGIOGRAM VISCERAL SELECTIVE  12/08/2020   IR EMBO ARTERIAL NOT HEMORR HEMANG INC GUIDE ROADMAPPING  11/03/2020   IR EMBO TUMOR ORGAN ISCHEMIA INFARCT INC GUIDE ROADMAPPING  11/17/2020   IR EMBO TUMOR ORGAN ISCHEMIA INFARCT INC GUIDE ROADMAPPING  12/08/2020   IR IMAGING GUIDED PORT INSERTION  03/05/2019   IR RADIOLOGIST EVAL & MGMT  10/06/2020   IR US GUIDE VASC ACCESS RIGHT  11/03/2020   IR US GUIDE VASC ACCESS RIGHT  11/17/2020   IR US GUIDE VASC ACCESS RIGHT  12/08/2020   LAPAROSCOPIC LYSIS OF ADHESIONS  11/01/2003   LAPAROSCOPIC PARTIAL COLECTOMY Right 08/11/2013   Procedure: LAPAROSCOPIC RIGHT HEMICOLECTOMY;  Surgeon: Stark Klein, MD;  Location: [place] [facility];  Service: General;  Laterality: Right;   LAPAROTOMY N/A 12/28/2014   Procedure: EXPLORATORY LAPAROTOMY;  Surgeon: Everitt Amber, MD;  Location: [facility];  Service: Gynecology;  Laterality: N/A;   LEFT OOPHORECTOMY  08/24/2004   PANNICULECTOMY  08/24/2004   PORT-A-CATH REMOVAL Left 06/08/2014   Procedure: REMOVAL PORT-A-CATH;  Surgeon: Stark Klein, MD;  Location: [facility];  Service: General;  Laterality: Left;   PORTACATH PLACEMENT Left 09/09/2013   Procedure: INSERTION PORT-A-CATH;  Surgeon: Stark Klein, MD;  Location: [place];  Service: General;  Laterality: Left;   SALPINGOOPHORECTOMY Right 12/28/2014   Procedure: RIGHT SALPINGO OOPHORECTOMY;  Surgeon: Everitt Amber, MD;  Location: [facility];  Service: Gynecology;  Laterality: Right;   TUBAL LIGATION  11/07/2000   UNILATERAL SALPINGECTOMY Left 08/24/2004   WISDOM TOOTH EXTRACTION Bilateral 1994    OB History   No obstetric history on file.      Home Medications    Prior to Admission medications   Medication Sig Start Date End Date Taking? Authorizing Provider  acetaminophen (TYLENOL) 500 MG tablet Take 1,000 mg by mouth every 6 (six) hours as needed.   Yes [provider]  CINNAMON PO Take 2,000 mg by mouth in the morning and at bedtime.   Yes  [provider]  diphenhydrAMINE (BENADRYL) 50 MG tablet Take 100 mg by mouth at bedtime as needed for itching or sleep.   Yes [provider]  ferrous sulfate 325 (65 FE) MG tablet Take 325 mg by mouth daily with breakfast.   Yes [provider]  hydrOXYzine (ATARAX/VISTARIL) 25 MG tablet Take 1 tablet (25 mg total) by mouth every 6 (six) hours. 12/03/20  Yes Mickie Hillier, PA-C  lacosamide (VIMPAT) 200 MG TABS tablet Take 1 tablet (200 mg total) by mouth 2 (two) times daily. 11/18/20  Yes Jaffe, Adam R, DO  LORazepam (ATIVAN) 0.5 MG tablet Take 1 tablet (0.5 mg total) by mouth 2 (two) times daily as needed. for anxiety 01/02/21  Yes Owens Shark, NP  magnesium oxide (MAG-OX) 400 (240 Mg) MG tablet Take 1 tablet (400 mg total) by mouth 2 (two) times daily. 10/17/20  Yes Owens Shark, NP  metFORMIN (GLUCOPHAGE) 500 MG tablet Take 1 tablet by mouth twice  daily with food for 7 days.  Then increase to 2 tablets with food in the morning and 1 tablet with food in the evening for 7 days.  Then increase to 2 tablets by mouth twice daily with food. 06/02/20  Yes [name], Raymond J, MD  omeprazole (PRILOSEC) 40 MG capsule Take 40 mg by mouth as needed.   Yes [provider]  prochlorperazine (COMPAZINE) 10 MG tablet Take 1 tablet (10 mg total) by mouth every 6 (six) hours as needed for nausea or vomiting. 12/20/20  Yes Owens Shark, NP  vitamin B-12 (CYANOCOBALAMIN) 1000 MCG tablet Take 1,000 mcg by mouth daily.   Yes [provider]  Accu-Chek Softclix Lancets lancets SMARTSIG:2 Topical Twice Daily 07/22/20   [provider]  blood glucose meter kit and supplies Dispense based on patient and insurance preference. Use up to four times daily as directed. (FOR ICD-10 E10.9, E11.9). 07/22/20   [name], [name], MD  diphenoxylate-atropine (LOMOTIL) 2.5-0.025 MG tablet TAKE 1- 2 TABLETS BY MOUTH 4 TIMES DAILY AS NEEDED FOR DIARRHEA /  LOOSE  STOOLS 08/23/20    [name], MD  lidocaine-prilocaine (EMLA) cream Apply 1 application topically as directed. Apply to port site 1 hour prior to stick and cover with plastic wrap 10/22/19   [name], MD  loperamide (IMODIUM) 2 MG capsule Take 2 mg by mouth as needed for diarrhea or loose stools.    [provider]  Polyethylene Glycol 3350 (MIRALAX PO) Take 17 g by mouth daily as needed.    [provider]    Family History Family History  Problem Relation Age of Onset   Colon polyps Mother        3+ colon polyps at each colonoscopy - "several"   Colitis Mother    Heart attack Mother    Diabetes Mother    Heart disease Mother    Diabetes Father    Heart attack Father    Stroke Father    Congestive Heart Failure Father    Heart disease Father    Cirrhosis Maternal Aunt    Lung cancer Maternal Uncle        d. 45s; smoker and worked around asbestos   Other Paternal Grandfather        "bone cancer"   Colon cancer Maternal Uncle        dx early 64s   Breast cancer Maternal Aunt        dx 50s-60s; s/p lumpectomy   Dementia Maternal Aunt    Other Cousin        maternal 1st cousin w/ "lung issues"   Dementia Paternal Aunt    Dementia Paternal Uncle    Prostate cancer Paternal Uncle 22   Esophageal cancer Neg Hx    Rectal cancer Neg Hx    Stomach cancer Neg Hx     Social History Social History   Tobacco Use   Smoking status: Never   Smokeless tobacco: Never  Vaping Use   Vaping Use: Never used  Substance Use Topics   Alcohol use: No   Drug use: No     Allergies   Adhesive [tape], Clindamycin/lincomycin, Promethazine, Eggs or egg-derived products, Metoclopramide hcl, Oxycodone-acetaminophen, Sulfa antibiotics, Levaquin [levofloxacin], and Penicillins   Review of Systems Review of Systems PER HPI  Physical Exam Triage Vital Signs ED Triage Vitals  Enc Vitals Group     BP 01/08/21 1422 133/87     Pulse Rate 01/08/21 1422  95     Resp 01/08/21 1422  18     Temp 01/08/21 1422 98 F (36.7 C)     Temp Source 01/08/21 1422 Temporal     SpO2 01/08/21 1422 97 %     Weight --      Height --      Head Circumference --      Peak Flow --      Pain Score 01/08/21 1423 7     Pain Loc --      Pain Edu? --      Excl. in [place]? --    No data found.  Updated Vital Signs BP 133/87   Pulse 95   Temp 98 F (36.7 C) (Temporal)   Resp 18   SpO2 97%   Visual Acuity Right Eye Distance:   Left Eye Distance:   Bilateral Distance:    Right Eye Near:   Left Eye Near:    Bilateral Near:     Physical Exam Vitals and nursing note reviewed.  Constitutional:      Appearance: Normal appearance. She is not ill-appearing.  HENT:     Head: Atraumatic.     Right Ear: Tympanic membrane normal.     Left Ear: Tympanic membrane normal.     Nose: Nose normal.     Mouth/Throat:     Mouth: Mucous membranes are moist.     Pharynx: No posterior oropharyngeal erythema.  Eyes:     Extraocular Movements: Extraocular movements intact.     Conjunctiva/sclera: Conjunctivae normal.  Cardiovascular:     Rate and Rhythm: Normal rate and regular rhythm.     Heart sounds: Normal heart sounds.  Pulmonary:     Effort: Pulmonary effort is normal.     Breath sounds: Normal breath sounds.  Abdominal:     General: Bowel sounds are normal.     Palpations: Abdomen is soft.  Musculoskeletal:        General: Normal range of motion.     Cervical back: Normal range of motion and neck supple.  Skin:    General: Skin is warm and dry.  Neurological:     Mental Status: She is alert and oriented to person, place, and time.  Psychiatric:        Mood and Affect: Mood normal.        Thought Content: Thought content normal.        Judgment: Judgment normal.     UC Treatments / Results  Labs (all labs ordered are listed, but only abnormal results are displayed) Labs Reviewed  CBC WITH DIFFERENTIAL/PLATELET - Abnormal; Notable for the following components:      Result  Value   WBC 2.6 (*)    MCV 76 (*)    MCH 24.6 (*)    RDW 16.4 (*)    Platelets 109 (*)    Lymphocytes Absolute 0.4 (*)    All other components within normal limits   Narrative:    Performed at:  [address] [place], [place]  [id number] Lab Director: Rush Farmer MD, Phone:  [phone]  COMPREHENSIVE METABOLIC PANEL - Abnormal; Notable for the following components:   Glucose 252 (*)    Alkaline Phosphatase 153 (*)    All other components within normal limits   Narrative:    Performed at:  [address] [place], [place]  [id number] Lab Director: Rush Farmer MD, Phone:  [phone]  COVID-19, FLU A+B NAA  Narrative:    Performed at:  [facility] [address] [place], [place]  [id number] Lab Director: Rush Farmer MD, Phone:  [phone]    EKG   Radiology No results found.  Procedures Procedures (including critical care time)  Medications Ordered in UC Medications - No data to display  Initial Impression / Assessment and Plan / UC Course  I have reviewed the triage vital signs and the nursing notes.  Pertinent labs & imaging results that were available during my care of the patient were reviewed by me and considered in my medical decision making (see chart for details).     Vitals and exam very reassuring, viral resp panel pending for r/o and basic labs also pending. Discussed supportive care, return precautions.   Final Clinical Impressions(s) / UC Diagnoses   Final diagnoses:  Malaise and fatigue  Ear pain, bilateral   Discharge Instructions   None    ED Prescriptions   None    PDMP not reviewed this encounter.   Volney American, [place] 01/12/21 2242

## 2021-01-17 ENCOUNTER — Encounter: Payer: Self-pay | Admitting: *Deleted

## 2021-01-17 ENCOUNTER — Ambulatory Visit
Admission: RE | Admit: 2021-01-17 | Discharge: 2021-01-17 | Disposition: A | Discharge status: Home / Self Care | Payer: BC Managed Care – PPO | Source: Ambulatory Visit | Attending: Interventional Radiology | Admitting: Interventional Radiology

## 2021-01-17 ENCOUNTER — Other Ambulatory Visit: Payer: Self-pay | Admitting: Neurology

## 2021-01-17 ENCOUNTER — Other Ambulatory Visit: Payer: Self-pay

## 2021-01-17 DIAGNOSIS — C787 Secondary malignant neoplasm of liver and intrahepatic bile duct: Secondary | ICD-10-CM | POA: Diagnosis not present

## 2021-01-17 DIAGNOSIS — C189 Malignant neoplasm of colon, unspecified: Secondary | ICD-10-CM

## 2021-01-17 DIAGNOSIS — C182 Malignant neoplasm of ascending colon: Secondary | ICD-10-CM | POA: Diagnosis not present

## 2021-01-17 HISTORY — PX: IR RADIOLOGIST EVAL & MGMT: IMG5224

## 2021-01-17 NOTE — Progress Notes (Signed)
Patient ID: [name], female   DOB: 25-Feb-1975, 45 y.o.   MRN: [id number]        Chief Complaint: Post bil-lobar Y-90  Referring Physician(s): Asyria Kolander  History of Present Illness: MEDA DUDZINSKI is a 45 y.o. 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  The patient has undergone multiple rounds of chemotherapy, most recently treated with irinotecan/panitumumab, complicated by diarrhea and skin toxicity.  She has been evaluated by surgical oncology at [facility] but has been deemed not a candidate for liver resection, hepatic infusion pump or ablation.  The patient is currently requesting a break from chemotherapy (though states that she may consider repeat treatment with FOLFOX in the future) and as such was evaluated for Y 90 radioembolization initially on 10/06/2020, initially undergoing mapping/planning Y 90 radioembolization on 11/03/2020 followed by Y 90 radioembolization of the lateral segment of the left lobe of the liver on 11/17/2020 and completion of Y 90 radioembolization of both the medial segment of the left lobe of the liver as well as the right lobe of the liver on 12/08/2020.  She is seen today in telemedicine consultation for postprocedural evaluation and management.  Patient did report to the emergency department approximately 1 week ago with chief complaints of fatigue which she ultimately attributed to a low-grade virus.  Patient also reports intermittent right lower quadrant abdominal pain, however feels this is not associated with the radioembolization.  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   Restless leg syndrome    Seizures ([place])    last seizure 08/2012    Past Surgical History:  Procedure Laterality Date   ABDOMINAL HYSTERECTOMY  08/24/2004   partial   APPENDECTOMY  08/24/2004   COLON SURGERY  08/11/2013   removed a foot of colon   COLONOSCOPY  2019   colonoscopy 10-18-14     COLONOSCOPY WITH PROPOFOL  07/07/2013   CYSTOSCOPY  11/01/2003   ENDOMETRIAL FULGURATION  11/01/2003   ESOPHAGOGASTRODUODENOSCOPY  07/07/2013   IR ANGIOGRAM SELECTIVE EACH ADDITIONAL VESSEL  11/03/2020   IR ANGIOGRAM SELECTIVE EACH ADDITIONAL VESSEL  11/03/2020   IR ANGIOGRAM SELECTIVE EACH ADDITIONAL VESSEL  11/03/2020   IR ANGIOGRAM SELECTIVE EACH ADDITIONAL VESSEL  11/03/2020   IR ANGIOGRAM SELECTIVE EACH ADDITIONAL VESSEL  11/17/2020   IR ANGIOGRAM SELECTIVE EACH ADDITIONAL VESSEL  11/17/2020   IR ANGIOGRAM SELECTIVE EACH ADDITIONAL VESSEL  11/17/2020   IR ANGIOGRAM SELECTIVE EACH ADDITIONAL VESSEL  12/08/2020   IR ANGIOGRAM SELECTIVE EACH ADDITIONAL VESSEL  12/08/2020   IR ANGIOGRAM VISCERAL SELECTIVE  11/03/2020   IR ANGIOGRAM VISCERAL SELECTIVE  11/03/2020   IR ANGIOGRAM VISCERAL SELECTIVE  11/17/2020   IR ANGIOGRAM VISCERAL SELECTIVE  12/08/2020   IR EMBO ARTERIAL NOT HEMORR HEMANG INC GUIDE ROADMAPPING  11/03/2020   IR  EMBO TUMOR ORGAN ISCHEMIA INFARCT INC GUIDE ROADMAPPING  11/17/2020   IR EMBO TUMOR ORGAN ISCHEMIA INFARCT INC GUIDE ROADMAPPING  12/08/2020   IR IMAGING GUIDED PORT INSERTION  03/05/2019   IR RADIOLOGIST EVAL & MGMT  10/06/2020   IR US GUIDE VASC ACCESS RIGHT  11/03/2020   IR US GUIDE VASC ACCESS RIGHT  11/17/2020   IR US GUIDE VASC ACCESS RIGHT  12/08/2020   LAPAROSCOPIC LYSIS OF ADHESIONS  11/01/2003    LAPAROSCOPIC PARTIAL COLECTOMY Right 08/11/2013   Procedure: LAPAROSCOPIC RIGHT HEMICOLECTOMY;  Surgeon: Stark Klein, MD;  Location: [place];  Service: General;  Laterality: Right;   LAPAROTOMY N/A 12/28/2014   Procedure: EXPLORATORY LAPAROTOMY;  Surgeon: Everitt Amber, MD;  Location: [facility];  Service: Gynecology;  Laterality: N/A;   LEFT OOPHORECTOMY  08/24/2004   PANNICULECTOMY  08/24/2004   PORT-A-CATH REMOVAL Left 06/08/2014   Procedure: REMOVAL PORT-A-CATH;  Surgeon: Stark Klein, MD;  Location: [place];  Service: General;  Laterality: Left;   PORTACATH PLACEMENT Left 09/09/2013   Procedure: INSERTION PORT-A-CATH;  Surgeon: Stark Klein, MD;  Location: [place];  Service: General;  Laterality: Left;   SALPINGOOPHORECTOMY Right 12/28/2014   Procedure: RIGHT SALPINGO OOPHORECTOMY;  Surgeon: Everitt Amber, MD;  Location: [facility];  Service: Gynecology;  Laterality: Right;   TUBAL LIGATION  11/07/2000   UNILATERAL SALPINGECTOMY Left 08/24/2004   WISDOM TOOTH EXTRACTION Bilateral 1994    Allergies: Adhesive [tape], Clindamycin/lincomycin, Promethazine, Eggs or egg-derived products, Metoclopramide hcl, Oxycodone-acetaminophen, Sulfa antibiotics, Levaquin [levofloxacin], and Penicillins  Medications: Prior to Admission medications   Medication Sig Start Date End Date Taking? Authorizing Provider  Accu-Chek Softclix Lancets lancets SMARTSIG:2 Topical Twice Daily 07/22/20   [provider]  acetaminophen (TYLENOL) 500 MG tablet Take 1,000 mg by mouth every 6 (six) hours as needed.    [provider]  blood glucose meter kit and supplies Dispense based on patient and insurance preference. Use up to four times daily as directed. (FOR ICD-10 E10.9, E11.9). 07/22/20   [name], Raymond J, MD  CINNAMON PO Take 2,000 mg by mouth in the morning and at bedtime.    [provider]  diphenhydrAMINE (BENADRYL) 50 MG tablet Take 100 mg by mouth at bedtime as needed for itching or  sleep.    [provider]  diphenoxylate-atropine (LOMOTIL) 2.5-0.025 MG tablet TAKE 1- 2 TABLETS BY MOUTH 4 TIMES DAILY AS NEEDED FOR DIARRHEA /  LOOSE  STOOLS 08/23/20   [name], MD  ferrous sulfate 325 (65 FE) MG tablet Take 325 mg by mouth daily with breakfast.    [provider]  hydrOXYzine (ATARAX/VISTARIL) 25 MG tablet Take 1 tablet (25 mg total) by mouth every 6 (six) hours. 12/03/20   Mickie Hillier, PA-C  lacosamide (VIMPAT) 200 MG TABS tablet Take 1 tablet (200 mg total) by mouth 2 (two) times daily. 11/18/20   [name], DO  lidocaine-prilocaine (EMLA) cream Apply 1 application topically as directed. Apply to port site 1 hour prior to stick and cover with plastic wrap 10/22/19   [name], MD  loperamide (IMODIUM) 2 MG capsule Take 2 mg by mouth as needed for diarrhea or loose stools.    [provider]  LORazepam (ATIVAN) 0.5 MG tablet Take 1 tablet (0.5 mg total) by mouth 2 (two) times daily as needed. for anxiety 01/02/21   Owens Shark, NP  magnesium oxide (MAG-OX) 400 (240 Mg) MG tablet Take 1 tablet (400  mg total) by mouth 2 (two) times daily. 10/17/20   Owens Shark, NP  metFORMIN (GLUCOPHAGE) 500 MG tablet Take 1 tablet by mouth twice daily with food for 7 days.  Then increase to 2 tablets with food in the morning and 1 tablet with food in the evening for 7 days.  Then increase to 2 tablets by mouth twice daily with food. 06/02/20   [name], Raymond J, MD  omeprazole (PRILOSEC) 40 MG capsule Take 40 mg by mouth as needed.    [provider]  Polyethylene Glycol 3350 (MIRALAX PO) Take 17 g by mouth daily as needed.    [provider]  prochlorperazine (COMPAZINE) 10 MG tablet Take 1 tablet (10 mg total) by mouth every 6 (six) hours as needed for nausea or vomiting. 12/20/20   Owens Shark, NP  vitamin B-12 (CYANOCOBALAMIN) 1000 MCG tablet Take 1,000 mcg by mouth daily.    [provider]     Family  History  Problem Relation Age of Onset   Colon polyps Mother        3+ colon polyps at each colonoscopy - "several"   Colitis Mother    Heart attack Mother    Diabetes Mother    Heart disease Mother    Diabetes Father    Heart attack Father    Stroke Father    Congestive Heart Failure Father    Heart disease Father    Cirrhosis Maternal Aunt    Lung cancer Maternal Uncle        d. 9s; smoker and worked around asbestos   Other Paternal Grandfather        "bone cancer"   Colon cancer Maternal Uncle        dx early 45s   Breast cancer Maternal Aunt        dx 50s-60s; s/p lumpectomy   Dementia Maternal Aunt    Other Cousin        maternal 1st cousin w/ "lung issues"   Dementia Paternal Aunt    Dementia Paternal Uncle    Prostate cancer Paternal Uncle 25   Esophageal cancer Neg Hx    Rectal cancer Neg Hx    Stomach cancer Neg Hx     Social History   Socioeconomic History   Marital status: Married    Spouse name: Not on file   Number of children: 2   Years of education: Not on file   Highest education level: Not on file  Occupational History   Occupation: Unemployed- full time student    Comment: Completed 12th grade  Tobacco Use   Smoking status: Never   Smokeless tobacco: Never  Vaping Use   Vaping Use: Never used  Substance and Sexual Activity   Alcohol use: No   Drug use: No   Sexual activity: Yes    Birth control/protection: Surgical  Other Topics Concern   Not on file  Social History Narrative   Right handed    Social Determinants of Health   Financial Resource Strain: Not on file  Food Insecurity: Not on file  Transportation Needs: Not on file  Physical Activity: Not on file  Stress: Not on file  Social Connections: Not on file    ECOG Status: 1 - Symptomatic but completely ambulatory  Review of Systems  Review of Systems: A 12 point ROS discussed and pertinent positives are indicated in the HPI above.  All other systems are  negative.  Physical Exam No direct physical  exam was performed (except for noted visual exam findings with Video Visits).   Vital Signs: There were no vitals taken for this visit.  Imaging:  The following examinations were reviewed in detail:  CT of the chest, abdomen and pelvis-10/11/2020 Abdominal MRI-07/08/2020 CT abdomen pelvis-07/19/2020; 11/19/2019; 12/30/2018; 08/07/2018. Ultrasound-guided liver lesion biopsy-02/11/2019 (pathology compatible with metastatic primary colonic adenocarcinoma). Mapping Y 33 radioembolization-11/03/2020 Y 90 radioembolization of the lateral division of the left hepatic artery-11/27/2020 Y 90 radioembolization of the medial segment of left hepatic artery as well as the right lobe of the liver-12/08/2020  Review of most recent CT scan of the chest, abdomen and pelvis performed 10/11/2020 it demonstrates progressive hepatic metastatic disease with dominant lesion within the central aspect of the lateral segment of the left lobe of the liver measuring approximately 3.7 x 3.9 cm, previously, 3.1 x 2.3 cm and dominant lesion within the posterior aspects of the right lobe the liver now measuring approximately 2.0 x 1.8 cm, previously, 1.5 x 1.5 cm.    Labs:  CBC: Recent Labs    11/24/20 1054 12/08/20 0750 12/22/20 0848 01/08/21 1453  WBC 3.7* 3.0* 2.8* 2.6*  HGB 12.0 11.9* 11.4* 12.3  HCT 38.5 38.2 36.5 37.9  PLT 151 133* 130* 109*    COAGS: Recent Labs    11/03/20 0810 11/17/20 0805 12/08/20 0750  INR 1.1 1.1 1.0    BMP: Recent Labs    11/24/20 1054 12/08/20 0750 12/22/20 0848 01/03/21 0839 01/08/21 1453  NA 135 135 137 136 137  K 3.9 3.7 3.6 4.0 4.3  CL 100 105 101 101 101  CO2 27 24 26 26 21   GLUCOSE 290* 244* 280* 258* 252*  BUN 14 17 19 10 11   CALCIUM 9.5 8.8* 10.1 10.0 10.0  CREATININE 0.60 0.62 0.56 0.47 0.63  GFRNONAA >60 >60 >60 >60  --     LIVER FUNCTION TESTS: Recent Labs    12/08/20 0750 12/22/20 0848 01/03/21 0839  01/08/21 1453  BILITOT 0.5 0.9 1.3* 0.6  AST 36 31 32 35  ALT 39 28 26 29   ALKPHOS 138* 128* 128* 153*  PROT 7.0 7.0 7.1 6.6  ALBUMIN 3.6 3.9 4.0 4.0    TUMOR MARKERS: Recent Labs    08/04/20 0819 08/15/20 0920 09/12/20 0910 10/17/20 1331  CEA 18.60* 25.91* 33.63* 117.27*    Assessment and Plan:  LATRENDA IRANI is a 45 y.o. 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  The patient has undergone multiple rounds of chemotherapy, most recently treated with irinotecan/panitumumab, complicated by diarrhea and skin toxicity.  She has been evaluated by surgical oncology at [facility] but has been deemed not a candidate for liver resection, hepatic infusion pump or ablation.  The patient is currently requesting a break from chemotherapy (though states that she may consider repeat treatment with FOLFOX in the future) and as such was evaluated for Y 90 radioembolization initially on 10/06/2020, initially undergoing mapping/planning Y 90 radioembolization on 11/03/2020 followed by Y 90 radioembolization of the lateral segment of the left lobe of the liver on 11/17/2020 and completion of Y 90 radioembolization of both the medial segment of the left lobe of the liver as well as the right lobe of the liver on 12/08/2020.  Patient does report onset of fatigue approximately 3 weeks after the Y 90 radioembolization however this has resolved without dedicated intervention and she has been able to successfully  return to work.    The patient also reports intermittent right lower quadrant abdominal pain however fears this is not associated with the embolization and may be associated with her previous colonic anastomosis.  As such, advised the patient to consider stool softeners and/or increasing her liquid intake.  Patient is otherwise tolerated the procedure well and is out is without  procedure related complication.  Postprocedural LFTs are all at her preprocedural baseline.  Specifically, her bilirubin has remained normal.  The following examinations were reviewed in detail:  CT of the chest, abdomen and pelvis-10/11/2020 Abdominal MRI-07/08/2020 CT abdomen pelvis-07/19/2020; 11/19/2019; 12/30/2018; 08/07/2018. Ultrasound-guided liver lesion biopsy-02/11/2019 (pathology compatible with metastatic primary colonic adenocarcinoma). Mapping Y 75 radioembolization-11/03/2020 Y 90 radioembolization of the lateral division of the left hepatic artery-11/27/2020 Y 90 radioembolization of the medial segment of left hepatic artery as well as the right lobe of the liver-12/08/2020  I explained to the patient that initial postprocedural CEA level and surveillance imaging will be obtained 3 months following the completion Y 90 radioembolization (early February 2023).  Plan: - Follow-up telemedicine consultation following acquisition of postprocedural CEA level and PET/CT to be obtained 3 months following completion Y 90 radioembolization (February 2023).  A copy of this report was sent to the requesting provider on this date.  Electronically Signed: Sandi Mariscal 01/17/2021, 9:02 AM   I spent a total of 10 Minutes in remote  clinical consultation, greater than 50% of which was counseling/coordinating care for post bilobar Y-90 radioembolization.    Visit type: Audio only (telephone). Audio (no video) only due to patient's lack of internet/smartphone capability. Alternative for in-person consultation at [facility], [address] [place], [place], [place]. This visit type was conducted due to national recommendations for restrictions regarding the COVID-19 Pandemic (e.g. social distancing).  This format is felt to be most appropriate for this patient at this time.  All issues noted in this document were discussed and addressed.

## 2021-01-19 ENCOUNTER — Other Ambulatory Visit: Payer: Self-pay

## 2021-01-19 ENCOUNTER — Encounter: Payer: Self-pay | Admitting: Nurse Practitioner

## 2021-01-19 ENCOUNTER — Inpatient Hospital Stay: Payer: BC Managed Care – PPO

## 2021-01-19 ENCOUNTER — Telehealth: Payer: Self-pay | Admitting: Nurse Practitioner

## 2021-01-19 ENCOUNTER — Inpatient Hospital Stay: Payer: BC Managed Care – PPO | Attending: Oncology | Admitting: Nurse Practitioner

## 2021-01-19 VITALS — BP 129/80 | HR 73 | Temp 97.9°F | Resp 18 | Ht 66.0 in | Wt 161.4 lb

## 2021-01-19 DIAGNOSIS — C182 Malignant neoplasm of ascending colon: Secondary | ICD-10-CM

## 2021-01-19 DIAGNOSIS — C787 Secondary malignant neoplasm of liver and intrahepatic bile duct: Secondary | ICD-10-CM | POA: Insufficient documentation

## 2021-01-19 DIAGNOSIS — Z95828 Presence of other vascular implants and grafts: Secondary | ICD-10-CM

## 2021-01-19 DIAGNOSIS — D696 Thrombocytopenia, unspecified: Secondary | ICD-10-CM | POA: Diagnosis not present

## 2021-01-19 DIAGNOSIS — Z452 Encounter for adjustment and management of vascular access device: Secondary | ICD-10-CM | POA: Insufficient documentation

## 2021-01-19 LAB — CBC WITH DIFFERENTIAL (CANCER CENTER ONLY)
Abs Immature Granulocytes: 0.01 10*3/uL (ref 0.00–0.07)
Basophils Absolute: 0 10*3/uL (ref 0.0–0.1)
Basophils Relative: 0 %
Eosinophils Absolute: 0 10*3/uL (ref 0.0–0.5)
Eosinophils Relative: 2 %
HCT: 36.4 % (ref 36.0–46.0)
Hemoglobin: 11.2 g/dL — ABNORMAL LOW (ref 12.0–15.0)
Immature Granulocytes: 1 %
Lymphocytes Relative: 20 %
Lymphs Abs: 0.4 10*3/uL — ABNORMAL LOW (ref 0.7–4.0)
MCH: 24.7 pg — ABNORMAL LOW (ref 26.0–34.0)
MCHC: 30.8 g/dL (ref 30.0–36.0)
MCV: 80.2 fL (ref 80.0–100.0)
Monocytes Absolute: 0.2 10*3/uL (ref 0.1–1.0)
Monocytes Relative: 12 %
Neutro Abs: 1.3 10*3/uL — ABNORMAL LOW (ref 1.7–7.7)
Neutrophils Relative %: 65 %
Platelet Count: 105 10*3/uL — ABNORMAL LOW (ref 150–400)
RBC: 4.54 MIL/uL (ref 3.87–5.11)
RDW: 16.6 % — ABNORMAL HIGH (ref 11.5–15.5)
WBC Count: 1.9 10*3/uL — ABNORMAL LOW (ref 4.0–10.5)
nRBC: 0 % (ref 0.0–0.2)

## 2021-01-19 LAB — CEA (ACCESS): CEA (CHCC): 61.14 ng/mL — ABNORMAL HIGH (ref 0.00–5.00)

## 2021-01-19 LAB — FERRITIN: Ferritin: 25 ng/mL (ref 11–307)

## 2021-01-19 MED ORDER — SODIUM CHLORIDE 0.9% FLUSH
10.0000 mL | Freq: Once | INTRAVENOUS | Status: AC
  Administered 2021-01-19: 10 mL

## 2021-01-19 MED ORDER — HEPARIN SOD (PORK) LOCK FLUSH 100 UNIT/ML IV SOLN
500.0000 [IU] | Freq: Once | INTRAVENOUS | Status: AC
  Administered 2021-01-19: 500 [IU]

## 2021-01-19 NOTE — Progress Notes (Signed)
[facility] OFFICE PROGRESS NOTE   Diagnosis: Colon cancer  INTERVAL HISTORY:   Ms. Tew returns as scheduled.  Overall she feels well.  Energy level remains poor.  She describes her appetite as "okay".  Some low abdominal pain.  Bowels moving with the aid of MiraLAX.  The left chest pain she had at the time of her last visit has resolved.  No further falls.  Objective:  Vital signs in last 24 hours:  Blood pressure 129/80, pulse 73, temperature 97.9 F (36.6 C), temperature source Oral, resp. rate 18, height _0  (1.676 m), weight 161 lb 6.4 oz (73.2 kg), SpO2 100 %.    Resp: Lungs clear bilaterally. Cardio: Regular rate and rhythm. GI: Abdomen soft and nontender.  No hepatosplenomegaly.  No mass. Vascular: No leg edema. Port-A-Cath without erythema.  Lab Results:  Lab Results  Component Value Date   WBC 1.9 (L) 01/19/2021   HGB 11.2 (L) 01/19/2021   HCT 36.4 01/19/2021   MCV 80.2 01/19/2021   PLT 105 (L) 01/19/2021   NEUTROABS 1.3 (L) 01/19/2021    Imaging:  IR Radiologist Eval & Mgmt  Result Date: 01/17/2021 Please refer to notes tab for details about interventional procedure. (Op Note)   Medications: I have reviewed the patient's current medications.  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 The tumor returned microsatellite stable with no loss of mismatch repair protein expression   APC mutated. No BRAF, KRAS, or NRAS mutation On [facility] 1 testing   Cycle 1 adjuvant FOLFOX 09/08/2013   Cycle 2 adjuvant FOLFOX 09/24/2013   Cycle 3 adjuvant FOLFOX 10/08/2013.   Cycle 4 adjuvant FOLFOX 10/22/2013.   Cycle 5 adjuvant FOLFOX 11/05/2013. Oxaliplatin held due to thrombocytopenia. Cycle 6 FOLFOX 11/19/2013. Cycle 7 FOLFOX 12/03/2013. Oxaliplatin held secondary to thrombocytopenia. Cycle 8 FOLFOX 12/17/2013. Cycle 9 FOLFOX  01/04/2014. Oxaliplatin held secondary to neutropenia.   Cycle 10 FOLFOX 01/21/2014. Oxaliplatin held secondary to thrombocytopenia. Cycle 11 FOLFOX 02/04/2014 Cycle 12 FOLFOX 02/18/2014, oxaliplatin dose reduced secondary tothrombocytopenia CT abdomen/pelvis 01/30/2014 revealed splenomegaly and no evidence of recurrent colon cancer CT chest 04/07/2014 with a stable right lower lobe nodule and no evidence for metastatic disease, no nodules seen on the CT 11/26/2014 Markedly elevated CEA 11/24/2014 CT 11/26/2014 revealed a right pelvic mass, splenomegaly, small volume ascites Right salpingo-oophorectomy 12/28/2014 with the pathology confirming metastatic colon cancer CTs 03/23/2016-no evidence of recurrent or metastatic disease. CT 11/26/2016-enlargement of a fluid density structure the right pelvic sidewall, no other evidence of metastatic disease CT aspiration right pelvic cyst 12/19/2016.  Cytology-BENIGN REACTIVE/REPARATIVE CHANGES. CTs 06/05/2017- no evidence of metastatic disease, mild cirrhotic changes with splenomegaly CTs 12/11/2017- recurrent cystic right adnexal mass, similar to on the CT 11/26/2016, stable mild splenomegaly CTs 04/23/2018- enlargement of cystic right adnexal mass with mural nodularity, no other evidence of metastatic disease Cytoreductive surgery/HIPEC with mitomycin by Dr. Clovis Riley at [facility] 08/12/2018-R1 resection achieved.  Cytoreduction included omentectomy, LAR, right salpingo-oophorectomy and left colonic gutter/pelvic stripping.  Pathology on the rectum showed recurrent/metastatic adenocarcinoma, tumor 2.0 cm, predominantly involving the subserosa and muscularis propria of the colon, proximal and distal margins of resection were negative, vascular invasion present, metastatic carcinoma present in 1 out of 5 lymph nodes; omentum resection with no malignancy seen, no metastatic carcinoma identified in 1 lymph node examined; left gutter stripping positive metastatic  adenocarcinoma; right ovary resection positive metastatic adenocarcinoma. CT 12/30/2018-findings consistent with enterocutaneous  fistula, ileus; multiple rounded hypodensities in the liver. CEA 68 02/03/2019 Biopsy liver lesion 02/11/2019-metastatic adenocarcinoma consistent with primary colonic adenocarcinoma CTs 02/27/2019-multiple liver lesions increased in size, new lesion in the lateral right lobe of the liver.  No evidence of metastatic disease in the chest.  Redemonstrated moderate left hydronephrosis and proximal hydroureter without discrete lesion or obstructing etiology at the transition point of the mid ureter. Cycle 1 FOLFIRI/Panitumumab 03/12/2019 Cycle 2 FOLFIRI/Panitumumab 03/26/2019-bolus 5-FU and irinotecan held secondary to neutropenia Cycle 3 FOLFIRI/Panitumumab 04/09/2019, Udenyca added-not given secondary to seizure/discontinuation of the 5-FU pump CT abdomen/pelvis at [facility] 05/04/2019-no residual fluid collection at the left abdominal wall abscess, stable moderate left hydronephrosis, multifocal indeterminate liver lesions--liver lesions significantly improved Cycle 4 FOLFIRI/Panitumumab 05/07/2019, Udenyca Cycle 5 FOLFIRI/Panitumumab 05/20/2019, Udenyca Cycle 6 FOLFIRI/Panitumumab 06/18/2019, Udenyca Cycle 7 FOLFIRI/Panitumumab 07/01/2019 (Irinotecan dose reduced due to thrombocytopenia), Udenyca--Udenyca was not given Cycle 8 FOLFIRI/Panitumumab 07/15/2019, Udenyca Cycle 9 FOLFIRI/Panitumumab 07/29/2019, Udenyca Cycle 10 FOLFIRI/Panitumumab 08/13/2019, Udenyca CTs at [facility] 08/19/2019-decreased size of the hypodense and hyperdense lesions within segment 2 of the left hepatic lobe.  No new lesions identified.  Similar presacral soft tissue thickening with adjacent stable alignment in the rectum.  Improved but persistent left UPJ obstruction with mild left hydronephrosis.  Persistent but decreased size of the wound within the left mid abdomen abdominal wall. Cycle 11  irinotecan/Panitumumab 08/27/2019, Udenyca Cycle 12 irinotecan/Panitumumab 10/08/2019, Udenyca Cycle 13 irinotecan/Panitumumab 11/05/2019, Udenyca CT abdomen/pelvis 11/19/2019-no new or progressive interval findings.  Stable appearance of the calcified lesion in the anterior left hepatic dome with second tiny low-density lesion in the dome of the lateral segment left liver.  Both lesions are markedly decreased since 02/27/2019.  Stable mild fullness left intrarenal collecting system and renal pelvis.  Similar appearance of abnormal soft tissue in the left paramidline subcutaneous fat with associated skin thickening, this likely correlates to the fistula. Cycle 14 irinotecan/Panitumumab 11/19/2019, Udenyca Cycle 15 irinotecan/Panitumumab 12/02/2019, Udenyca CT abdomen/pelvis at [facility] 02/11/2020- similar to slight decrease in segment 2 hypodense and hyperdense lesions (compared to a CT from July 2020), no new lesions, splenomegaly, no fluid collection or abscess, mild stranding adjacent to the incision with overlying skin thickening Cycle 16 irinotecan/Panitumumab 03/03/2020, Udenyca Cycle 17 irinotecan/Panitumumab 03/17/2020, Udenyca Cycle 18 irinotecan/panitumumab 04/07/2020, Udenyca Cycle 19 irinotecan/Panitumumab 04/28/2020, Udenyca Cycle 20 irinotecan/panitumumab 05/26/2020, Udenyca Cycle 21 irinotecan/panitumumab 06/30/2020, Udenyca CT abdomen/pelvis 07/19/2020-mildly increased hepatic steatosis.  Increased size of 2.8 cm hypervascular lesion segment 2 left hepatic lobe. Cycle 22 irinotecan/Panitumumab 07/21/2020, Udenyca MRI Abd 07/28/2020: liver lesions consistent with hepatic metastasis Cycle 23 irinotecan/Panitumumab 08/04/2020, Udenyca  Cycle 24 irinotecan/panitumumab 08/22/2020 Irinotecan/panitumumab discontinued secondary to diarrhea and skin toxicity Guardant 360 on 09/12/2020-K-ras G12V, PIK3CA,NTRK2-VUS, tumor mutation burden 6.85, MSI-high not detected Referred for consideration of Y 90 CTs  10/11/2020-for enlarging hepatic metastatic lesions identified.  No new lesions.  Several tiny nodules in the lungs in the 2 to 3 mm diameter range.  Mild splenomegaly.  Mild left hydronephrosis. 11/17/2020-Y90 lateral segment left lobe of the liver 12/08/2020-Y 90 treatment to the right liver Mild elevation of the CEA beginning January 2016 , normal on 05/19/2014   History of iron deficiency anemia   seizure disorder; seizure 04/09/2019.  Brain CT negative.  Now on Keppra. history of depression   4 mm right lower lobe nodule on a staging chest CT 09/08/2013 , stable on a CT 04/07/2014  Hospitalization 09/24/2013 through 09/26/2013 with fever and abdominal pain.   09/24/2013 urine culture positive for  coag negative staph.   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  Scope advanced for approximately 25 cm.  Most proximal portion had necrotic appearing debris.  Colostomy bag insufflated suggesting some type of communication between the pouch and the proximal colon.  Introduction of scope into the ostomy found to available directions.  1 toward the distal pouch with similar appearing necrotic debris encountered.  The other was about a 30 cm segment of normal-appearing colonic mucosa to the level of the previous right hemicolectomy ileocolonic anastomosis. Port-A-Cath placement interventional radiology 03/05/2019 Left leg/foot weakness-brain MRI 09/03/2019 with no evidence of metastatic disease, referred to physical therapy Possible abdominal wall abscess status post evaluation by surgery at [facility] 04/03/2019, antibiotics initiated; incision and drainage with purulent material removed 04/22/2019 CT at [facility] 05/04/2019-no fluid collection at the site of the left  abdominal wall abscess, "indeterminate" liver lesions CT at [facility] 06/04/2019-persistent open wound of the left abdomen, 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  No malignancy.   16. COVID-19 + 09/10/2019 17. Hospitalized with seizure 09/17/2019 through 09/19/2019-MRI brain without evidence of metastatic disease. Seen by neurology. Keppra dose increased. 19.  Rash secondary to Panitumumab 20.  COVID-19 09/22/2020    Disposition: [name] appears stable.  She completed Y 90 to the right liver in early November.  She continues to have fatigue/malaise though some days are better than others.  This will hopefully continue to improve.  We will follow-up on the CEA from today.  CBC from today reviewed.  She has mild neutropenia and mild thrombocytopenia.  Precautions reviewed.  She understands to contact the office with fever, chills, other signs of infection, bleeding.  Dr. Pascal Lux is planning for PET/CT in February.  She will return for lab, Port-A-Cath flush and follow-up here in 4 weeks.  We are available to see her sooner if needed.    Ned Card ANP/GNP-BC   01/19/2021  10:04 AM

## 2021-01-19 NOTE — Patient Instructions (Signed)

## 2021-01-19 NOTE — Telephone Encounter (Signed)
Scheduled appt per 12/15 los - patient is aware of appt date and time

## 2021-01-27 ENCOUNTER — Other Ambulatory Visit: Payer: Self-pay | Admitting: Nurse Practitioner

## 2021-01-27 DIAGNOSIS — C787 Secondary malignant neoplasm of liver and intrahepatic bile duct: Secondary | ICD-10-CM

## 2021-01-27 DIAGNOSIS — C182 Malignant neoplasm of ascending colon: Secondary | ICD-10-CM

## 2021-02-01 NOTE — Progress Notes (Signed)
NEUROLOGY FOLLOW UP OFFICE NOTE  [name] [id number]  Assessment/Plan:   Primary generalized seizures Restless leg syndrome Tremor - not appreciated on today's exam.  No other signs to suggest Parkinson's disease.  Unlikely to be related to chemotherapy, as she finished it back in July.  Probably essential tremor.  Continue lacosamide 250m twice daily Continue B12 10077m daily and ferrous sulfate 32530maily - repeat B12 and ferritin level in 6 months To evaluate for secondary causes of tremor, will check TSH.  We decided to monitor tremor for now.  If tremor gets worse, we can start a medication called primidone. Otherwise, follow up around 8 months.   Subjective:  [name] a 45 1d female with metastatic colon cancer, depression, anxiety, migraine and restless leg syndrome who follows up for primary generalized epilepsy.   UPDATE: Current medications:  Vimpat 200m37mD, B12 1000mc61mily, ferrous sulfate 325mg 27my, lorazepam 0.5mg BI3mRN (anxiety)   She began having worsening of restless legs a few months ago.  Labs in September revealed B12 of 146 and ferritin level of 14.  She was advised to start B12 1000mcg d60m and ferrous sulfate 325mg dai17m idney function was normal.  Repeat ferritin level in December was 25.  Since starting the supplements, restless leg is better controlled.  She has mild constipation on ferrous sulfate but not significant.    Over the past couple of months, she has noticed tremors in her hands.  She notices it when she gets her nails done.  She also may shake and drop a hair brush when she is cutting hair.  She finished chemotherapy in July.  No known family history of tremor.  HISTORY: She began having seizures in 2009.  There was no precipitating factor.  Her seizures present as generalized tonic-clonic.  Reportedly, she has had several seizures while asleep, as she sometimes wakes up having bit her tongue but no blood on the  pillow.  No witnesses to seizure activity at night.  She usually has migraine the next day.  MRI of brain with and without contrast on 09/10/2008 was unremarkable.  EEG from 11/08/2010 showed generalized polyspike and wave discharged.     She established care with me in July 2014 after having a seizure.  At the time, she was on Keppra XR 1500mg twic59mily.  Due to mood changes, she was switched to lamotrigine.  She was last seen by me in July 2015.  She had a lot going on and subsequently stopped her medication and was lost to follow-up.  She was seizure-free until March 2021 when she had a seizure.  It was suspected that it may have been provoked by Levaquin which she was taking to treat an abscess from her colostomy reversal.  EEG showed generalized epileptiform activity.  She was restarted on Keppra 500mg twice2mly.  MRI of brain with and without contrast on 09/03/2019 personally reviewed was unremarkable.  She was diagnosed with COVID-19 in August 2021.  A week later, she was admitted to the hospital for multiple seizures, diagnosed with status epilepticus. Limited MRI of brain without contrast was unremarkable.  EEG showed generalized and left frontotemporal spikes and polyspike waves.  Keppra was increased to 1500mg twice 37my but caused irritability.  She also has restless leg syndrome.     Previous medications include Keppra (irritability) and phenobarbital, but she cannot recall if she had been on anything else.  Records mention carbamazepine.  Lamotrigine.  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     blood glucose meter kit and supplies Dispense based on patient and insurance preference. Use up to four times daily as directed. (FOR ICD-10 E10.9, E11.9). 1 each 1   CINNAMON PO Take 2,000 mg by mouth in the morning and at bedtime.     diphenhydrAMINE (BENADRYL) 50 MG tablet Take 100 mg by mouth at bedtime as needed for itching or sleep.     diphenoxylate-atropine (LOMOTIL) 2.5-0.025 MG tablet TAKE 1- 2 TABLETS BY MOUTH 4 TIMES DAILY AS NEEDED FOR DIARRHEA /  LOOSE  STOOLS 120 tablet 1   ferrous sulfate 325 (65 FE) MG tablet Take 325 mg by mouth daily with breakfast.     hydrOXYzine (ATARAX/VISTARIL) 25 MG tablet Take 1 tablet (25 mg total) by mouth every 6 (six) hours. 12 tablet 0   lacosamide (VIMPAT) 200 MG TABS tablet Take 1 tablet by mouth twice daily 60 tablet 0   lidocaine-prilocaine (EMLA) cream Apply 1 application topically as directed. Apply to port site 1 hour prior to stick and cover with plastic wrap 30 g 1   loperamide (IMODIUM) 2 MG capsule Take 2 mg by mouth as needed for diarrhea or loose stools.     LORazepam (ATIVAN) 0.5 MG tablet Take 1 tablet (0.5 mg total) by mouth 2 (two) times daily as needed. for anxiety 60 tablet 0   magnesium oxide (MAG-OX) 400 (240 Mg) MG tablet Take 1 tablet by mouth twice daily 60 tablet 0   metFORMIN (GLUCOPHAGE) 500 MG tablet Take 1 tablet by mouth twice daily with food for 7 days.  Then increase to 2 tablets with food in the morning and 1 tablet with food in the evening for 7 days.  Then increase to 2 tablets by mouth twice daily with food. 180 tablet 3   omeprazole (PRILOSEC) 40 MG capsule Take 40 mg by  mouth as needed.     Polyethylene Glycol 3350 (MIRALAX PO) Take 17 g by mouth daily as needed.     prochlorperazine (COMPAZINE) 10 MG tablet Take 1 tablet (10 mg total) by mouth every 6 (six) hours as needed for nausea or vomiting. 60 tablet 2   vitamin B-12 (CYANOCOBALAMIN) 1000 MCG tablet Take 1,000 mcg by mouth daily.     No current facility-administered medications on file prior to visit.    ALLERGIES: Allergies  Allergen Reactions   Adhesive [Tape] Hives, Shortness Of Breath and Swelling    Tolerates paper tape   Clindamycin/Lincomycin Itching, Rash and Other (See Comments)    Generalized body rash with itching, lip ulcers, seizures   Promethazine Other (See Comments)  Increase restless leg movement    Eggs Or Egg-Derived Products Nausea And Vomiting   Metoclopramide Hcl Other (See Comments)    EXACERBATES RESTLESS LEG SYNDROME   Oxycodone-Acetaminophen Hives   Sulfa Antibiotics Nausea And Vomiting   Levaquin [Levofloxacin]     seizures   Penicillins Itching    Has patient had a PCN reaction causing immediate rash, facial/tongue/throat swelling, SOB or lightheadedness with hypotension: No Has patient had a PCN reaction causing severe rash involving mucus membranes or skin necrosis: No Has patient had a PCN reaction that required hospitalization No Has patient had a PCN reaction occurring within the last 10 years: No If all of the above answers are "NO", then may proceed with Cephalosporin use.     FAMILY HISTORY: Family History  Problem Relation Age of Onset   Colon polyps Mother        3+ colon polyps at each colonoscopy - "several"   Colitis Mother    Heart attack Mother    Diabetes Mother    Heart disease Mother    Diabetes Father    Heart attack Father    Stroke Father    Congestive Heart Failure Father    Heart disease Father    Cirrhosis Maternal Aunt    Lung cancer Maternal Uncle        d. 28s; smoker and worked around asbestos   Other Paternal  Grandfather        "bone cancer"   Colon cancer Maternal Uncle        dx early 94s   Breast cancer Maternal Aunt        dx 50s-60s; s/p lumpectomy   Dementia Maternal Aunt    Other Cousin        maternal 1st cousin w/ "lung issues"   Dementia Paternal Aunt    Dementia Paternal Uncle    Prostate cancer Paternal Uncle 73   Esophageal cancer Neg Hx    Rectal cancer Neg Hx    Stomach cancer Neg Hx       Objective:  Blood pressure 126/72, pulse 94, height _0  (1.676 m), weight 161 lb 3.2 oz (73.1 kg), SpO2 98 %. General: No acute distress.  Patient appears well-groomed.   Head:  Normocephalic/atraumatic Eyes:  Fundi examined but not visualized Neck: supple, no paraspinal tenderness, full range of motion Heart:  Regular rate and rhythm Lungs:  Clear to auscultation bilaterally Back: No paraspinal tenderness Neurological Exam: alert and oriented to person, place, and time.  Speech fluent and not dysarthric, language intact.  CN II-XII intact. Bulk and tone normal, no rigidity or bradykinesia; postural and kinetic tremor of hands not appreciated; muscle strength 5/5 throughout.  Sensation to temperature and vibration intact.  Deep tendon reflexes 2+ throughout, toes downgoing.  Finger to nose testing intact.  Gait normal, Romberg negative.   Emily Clines, DO  CC: Raymond [name], MD

## 2021-02-03 ENCOUNTER — Other Ambulatory Visit: Payer: Self-pay

## 2021-02-03 ENCOUNTER — Encounter: Payer: Self-pay | Admitting: Neurology

## 2021-02-03 ENCOUNTER — Ambulatory Visit: Payer: BC Managed Care – PPO | Admitting: Neurology

## 2021-02-03 VITALS — BP 126/72 | HR 94 | Ht 66.0 in | Wt 161.2 lb

## 2021-02-03 DIAGNOSIS — R251 Tremor, unspecified: Secondary | ICD-10-CM | POA: Diagnosis not present

## 2021-02-03 DIAGNOSIS — G40309 Generalized idiopathic epilepsy and epileptic syndromes, not intractable, without status epilepticus: Secondary | ICD-10-CM

## 2021-02-03 DIAGNOSIS — G2581 Restless legs syndrome: Secondary | ICD-10-CM

## 2021-02-03 NOTE — Patient Instructions (Signed)
Continue lacosamide 200mg  twice daily Continue B12 1067mcg daily and ferrous sulfate 325mg  daily - repeat B12 and ferritin level in 6 months Tremor is likely benign essential tremor.  Will check TSH.  If tremor gets worse, we can start a medication called primidone. Otherwise, follow up around 8 months.

## 2021-02-10 ENCOUNTER — Ambulatory Visit (INDEPENDENT_AMBULATORY_CARE_PROVIDER_SITE_OTHER): Payer: BC Managed Care – PPO

## 2021-02-10 ENCOUNTER — Ambulatory Visit
Admission: EM | Admit: 2021-02-10 | Discharge: 2021-02-10 | Disposition: A | Discharge status: Home / Self Care | Payer: BC Managed Care – PPO | Attending: Urgent Care | Admitting: Urgent Care

## 2021-02-10 ENCOUNTER — Other Ambulatory Visit: Payer: Self-pay

## 2021-02-10 DIAGNOSIS — L089 Local infection of the skin and subcutaneous tissue, unspecified: Secondary | ICD-10-CM

## 2021-02-10 DIAGNOSIS — Z043 Encounter for examination and observation following other accident: Secondary | ICD-10-CM | POA: Diagnosis not present

## 2021-02-10 DIAGNOSIS — M25562 Pain in left knee: Secondary | ICD-10-CM

## 2021-02-10 DIAGNOSIS — T148XXA Other injury of unspecified body region, initial encounter: Secondary | ICD-10-CM

## 2021-02-10 DIAGNOSIS — S8002XA Contusion of left knee, initial encounter: Secondary | ICD-10-CM | POA: Diagnosis not present

## 2021-02-10 DIAGNOSIS — S80212A Abrasion, left knee, initial encounter: Secondary | ICD-10-CM | POA: Diagnosis not present

## 2021-02-10 DIAGNOSIS — W19XXXA Unspecified fall, initial encounter: Secondary | ICD-10-CM | POA: Diagnosis not present

## 2021-02-10 MED ORDER — NAPROXEN 500 MG PO TABS
500.0000 mg | ORAL_TABLET | Freq: Two times a day (BID) | ORAL | 0 refills | Status: DC
Start: 2021-02-10 — End: 2021-03-30

## 2021-02-10 MED ORDER — DOXYCYCLINE HYCLATE 100 MG PO CAPS: 100.0000 mg | ORAL_CAPSULE | Freq: Two times a day (BID) | ORAL | 0 refills | Status: DC

## 2021-02-10 MED ORDER — BACITRACIN ZINC 500 UNIT/GM EX OINT: 1.0000 "application " | TOPICAL_OINTMENT | Freq: Two times a day (BID) | CUTANEOUS | 0 refills | Status: DC

## 2021-02-10 NOTE — ED Provider Notes (Signed)
[place]   MRN: [id number] DOB: 01-05-1976  Subjective:   [name] is a 46 y.o. female presenting for 3-day history of acute onset persistent and worsening left knee pain with swelling.  Patient suffered a fall in her bathroom landing directly onto carpet and observing most of the impact with her left knee.  The mechanism of injury was that she was trying to put her short time and unfortunately lost her footing causing her to fall with her knee pointed downward.  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  She is walking but it hurts significantly.  Has been using ibuprofen and Tylenol with minimal relief.  No current facility-administered medications for this encounter.  Current Outpatient Medications:    Accu-Chek Softclix Lancets lancets, SMARTSIG:2 Topical Twice Daily, Disp: , Rfl:    acetaminophen (TYLENOL) 500 MG tablet, Take 1,000 mg by mouth every 6 (six) hours as needed., Disp: , Rfl:    blood glucose meter kit and supplies, Dispense based on patient and insurance preference. Use up to four times daily as directed. (FOR ICD-10 E10.9, E11.9)., Disp: 1 each, Rfl: 1   CINNAMON PO, Take 2,000 mg by mouth in the morning and at bedtime., Disp: , Rfl:    diphenhydrAMINE (BENADRYL) 50 MG tablet, Take 100 mg by mouth at bedtime as needed for itching or sleep., Disp: , Rfl:    diphenoxylate-atropine (LOMOTIL) 2.5-0.025 MG tablet, TAKE 1- 2 TABLETS BY MOUTH 4 TIMES DAILY AS NEEDED FOR DIARRHEA /  LOOSE  STOOLS, Disp: 120 tablet, Rfl: 1   ferrous sulfate 325 (65 FE) MG tablet, Take 325 mg by mouth daily with breakfast., Disp: , Rfl:    hydrOXYzine (ATARAX/VISTARIL) 25 MG tablet, Take 1 tablet (25 mg total) by mouth every 6 (six) hours., Disp: 12 tablet, Rfl: 0   lacosamide (VIMPAT) 200 MG TABS tablet, Take 1 tablet by mouth twice daily, Disp: 60 tablet, Rfl: 0   lidocaine-prilocaine (EMLA) cream, Apply 1 application topically as  directed. Apply to port site 1 hour prior to stick and cover with plastic wrap, Disp: 30 g, Rfl: 1   loperamide (IMODIUM) 2 MG capsule, Take 2 mg by mouth as needed for diarrhea or loose stools., Disp: , Rfl:    LORazepam (ATIVAN) 0.5 MG tablet, Take 1 tablet (0.5 mg total) by mouth 2 (two) times daily as needed. for anxiety, Disp: 60 tablet, Rfl: 0   magnesium oxide (MAG-OX) 400 (240 Mg) MG tablet, Take 1 tablet by mouth twice daily, Disp: 60 tablet, Rfl: 0   metFORMIN (GLUCOPHAGE) 500 MG tablet, Take 1 tablet by mouth twice daily with food for 7 days.  Then increase to 2 tablets with food in the morning and 1 tablet with food in the evening for 7 days.  Then increase to 2 tablets by mouth twice daily with food., Disp: 180 tablet, Rfl: 3   omeprazole (PRILOSEC) 40 MG capsule, Take 40 mg by mouth as needed., Disp: , Rfl:    Polyethylene Glycol 3350 (MIRALAX PO), Take 17 g by mouth daily as needed., Disp: , Rfl:    prochlorperazine (COMPAZINE) 10 MG tablet, Take 1 tablet (10 mg total) by mouth every 6 (six) hours as needed for nausea or vomiting., Disp: 60 tablet, Rfl: 2   vitamin B-12 (CYANOCOBALAMIN) 1000 MCG tablet, Take 1,000 mcg by mouth daily., Disp: , Rfl:    Allergies  Allergen Reactions   Adhesive [Tape] Hives, Shortness Of  Breath and Swelling    Tolerates paper tape   Clindamycin/Lincomycin Itching, Rash and Other (See Comments)    Generalized body rash with itching, lip ulcers, seizures   Promethazine Other (See Comments)    Increase restless leg movement    Eggs Or Egg-Derived Products Nausea And Vomiting   Metoclopramide Hcl Other (See Comments)    EXACERBATES RESTLESS LEG SYNDROME   Oxycodone-Acetaminophen Hives   Sulfa Antibiotics Nausea And Vomiting   Fluogen [Influenza Virus Vaccine] Nausea And Vomiting   Levaquin [Levofloxacin]     seizures   Penicillins Itching    Has patient had a PCN reaction causing immediate rash, facial/tongue/throat swelling, SOB or lightheadedness  with hypotension: No Has patient had a PCN reaction causing severe rash involving mucus membranes or skin necrosis: No Has patient had a PCN reaction that required hospitalization No Has patient had a PCN reaction occurring within the last 10 years: No If all of the above answers are "NO", then may proceed with Cephalosporin use.     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   Restless leg syndrome    Seizures ([place])    last seizure 08/2012     Past Surgical History:  Procedure Laterality Date   ABDOMINAL HYSTERECTOMY  08/24/2004   partial   APPENDECTOMY  08/24/2004   COLON SURGERY  08/11/2013   removed a foot of colon   COLONOSCOPY  2019   colonoscopy 10-18-14     COLONOSCOPY WITH PROPOFOL  07/07/2013   CYSTOSCOPY  11/01/2003   ENDOMETRIAL FULGURATION  11/01/2003   ESOPHAGOGASTRODUODENOSCOPY  07/07/2013   IR ANGIOGRAM SELECTIVE EACH ADDITIONAL VESSEL  11/03/2020   IR ANGIOGRAM SELECTIVE EACH ADDITIONAL VESSEL  11/03/2020   IR ANGIOGRAM SELECTIVE EACH ADDITIONAL VESSEL  11/03/2020   IR ANGIOGRAM SELECTIVE EACH ADDITIONAL VESSEL  11/03/2020   IR ANGIOGRAM SELECTIVE EACH ADDITIONAL VESSEL  11/17/2020   IR ANGIOGRAM SELECTIVE EACH ADDITIONAL VESSEL  11/17/2020   IR ANGIOGRAM SELECTIVE EACH ADDITIONAL VESSEL  11/17/2020   IR ANGIOGRAM SELECTIVE EACH ADDITIONAL VESSEL  12/08/2020   IR ANGIOGRAM SELECTIVE EACH ADDITIONAL VESSEL  12/08/2020   IR ANGIOGRAM VISCERAL SELECTIVE  11/03/2020   IR ANGIOGRAM VISCERAL SELECTIVE  11/03/2020   IR ANGIOGRAM VISCERAL SELECTIVE  11/17/2020   IR ANGIOGRAM VISCERAL SELECTIVE  12/08/2020    IR EMBO ARTERIAL NOT HEMORR HEMANG INC GUIDE ROADMAPPING  11/03/2020   IR EMBO TUMOR ORGAN ISCHEMIA INFARCT INC GUIDE ROADMAPPING  11/17/2020   IR EMBO TUMOR ORGAN ISCHEMIA INFARCT INC GUIDE ROADMAPPING  12/08/2020   IR IMAGING GUIDED PORT INSERTION  03/05/2019   IR RADIOLOGIST EVAL & MGMT  10/06/2020   IR RADIOLOGIST EVAL & MGMT  01/17/2021   IR US GUIDE VASC ACCESS RIGHT  11/03/2020   IR US GUIDE VASC ACCESS RIGHT  11/17/2020   IR US GUIDE VASC ACCESS RIGHT  12/08/2020   LAPAROSCOPIC LYSIS OF ADHESIONS  11/01/2003   LAPAROSCOPIC PARTIAL COLECTOMY Right 08/11/2013   Procedure: LAPAROSCOPIC RIGHT HEMICOLECTOMY;  Surgeon: Stark Klein, MD;  Location: [facility];  Service: General;  Laterality: Right;   LAPAROTOMY N/A 12/28/2014   Procedure: EXPLORATORY LAPAROTOMY;  Surgeon: Everitt Amber, MD;  Location: [facility];  Service: Gynecology;  Laterality: N/A;   LEFT OOPHORECTOMY  08/24/2004   PANNICULECTOMY  08/24/2004   PORT-A-CATH REMOVAL Left 06/08/2014   Procedure: REMOVAL PORT-A-CATH;  Surgeon: Stark Klein, MD;  Location: [facility];  Service: General;  Laterality: Left;   PORTACATH PLACEMENT Left 09/09/2013   Procedure: INSERTION PORT-A-CATH;  Surgeon: Stark Klein, MD;  Location: [facility];  Service: General;  Laterality: Left;   SALPINGOOPHORECTOMY Right 12/28/2014   Procedure: RIGHT SALPINGO OOPHORECTOMY;  Surgeon: Everitt Amber, MD;  Location: [facility];  Service: Gynecology;  Laterality: Right;   TUBAL LIGATION  11/07/2000   UNILATERAL SALPINGECTOMY Left 08/24/2004   WISDOM TOOTH EXTRACTION Bilateral 1994    Family History  Problem Relation Age of Onset   Colon polyps Mother        3+ colon polyps at each colonoscopy - "several"   Colitis Mother    Heart attack Mother    Diabetes Mother    Heart disease Mother    Diabetes Father    Heart attack Father    Stroke Father    Congestive Heart Failure Father    Heart disease Father    Cirrhosis Maternal Aunt    Lung cancer Maternal Uncle         d. 56s; smoker and worked around asbestos   Other Paternal Grandfather        "bone cancer"   Colon cancer Maternal Uncle        dx early 32s   Breast cancer Maternal Aunt        dx 50s-60s; s/p lumpectomy   Dementia Maternal Aunt    Other Cousin        maternal 1st cousin w/ "lung issues"   Dementia Paternal Aunt    Dementia Paternal Uncle    Prostate cancer Paternal Uncle 39   Esophageal cancer Neg Hx    Rectal cancer Neg Hx    Stomach cancer Neg Hx     Social History   Tobacco Use   Smoking status: Never   Smokeless tobacco: Never  Vaping Use   Vaping Use: Never used  Substance Use Topics   Alcohol use: No   Drug use: No    ROS   Objective:   Vitals: BP 115/75 (BP Location: Right Arm)    Pulse 93    Resp 16    SpO2 98%   Physical Exam Constitutional:      General: She is not in acute distress.    Appearance: Normal appearance. She is well-developed. She is not ill-appearing.  HENT:     Head: Normocephalic and atraumatic.     Nose: Nose normal.     Mouth/Throat:     Mouth: Mucous membranes are moist.     Pharynx: Oropharynx is clear.  Eyes:     General: No scleral icterus.    Extraocular Movements: Extraocular movements intact.     Pupils: Pupils are equal, round, and reactive to light.  Cardiovascular:     Rate and Rhythm: Normal rate.  Pulmonary:     Effort: Pulmonary effort is normal.  Musculoskeletal:     Left knee: Erythema and bony tenderness present. No swelling, deformity, effusion, ecchymosis, lacerations or crepitus. Decreased range of motion. Tenderness present over the patellar tendon. No medial joint line or lateral joint line tenderness. Normal alignment and normal patellar mobility.  Legs:     Comments: Ambulates without assistance.  Skin:    General: Skin is warm and dry.  Neurological:     General: No focal deficit present.     Mental Status: She is alert and oriented to person, place, and time.  Psychiatric:        Mood and  Affect: Mood normal.        Behavior: Behavior normal.    DG Knee Complete 4 Views Left  Result Date: 02/10/2021 CLINICAL DATA:  Fall EXAM: LEFT KNEE - COMPLETE 4+ VIEW COMPARISON:  None. FINDINGS: No acute fracture or dislocation.  No joint effusion. IMPRESSION: Negative. Electronically Signed   By: Abigail Miyamoto M.D.   On: 02/10/2021 14:32    Bacitracin applied to the infected abrasion, 3 x 4 nonadherent dressing was placed over that and secured with an 4" Ace wrap.  Assessment and Plan :   PDMP not reviewed this encounter.  1. Infected abrasion   2. Acute pain of left knee   3. Contusion of left knee, initial encounter    Will have patient use doxycycline to address an infected abrasion.  This is an effort to address the infection itself but to avoid progression into a septic joint.  Counseled on wound care.  A dressing applied as above.  Use naproxen for pain and inflammation otherwise. Counseled patient on potential for adverse effects with medications prescribed/recommended today, ER and return-to-clinic precautions discussed, patient verbalized understanding.    Jaynee Eagles, [place] 02/10/21 1456

## 2021-02-10 NOTE — ED Triage Notes (Signed)
Patient presents to Urgent Care with complaints of left knee injury from falling Tuesday. Pt states pain is worse. Treating pain with ibuprofen and tylenol.

## 2021-02-10 NOTE — Discharge Instructions (Signed)
Please change your dressing 2-3 times daily. Apply Bacitracin to the knee using a non-stick gauze. Each time you change your dressing, make sure you clean gently around the perimeter of the wound with gentle soap and warm water. Pat your wound dry and let it air out if possible for 1-2 hours before reapplying another dressing.

## 2021-02-16 ENCOUNTER — Telehealth: Payer: Self-pay | Admitting: Oncology

## 2021-02-16 ENCOUNTER — Inpatient Hospital Stay: Payer: BC Managed Care – PPO

## 2021-02-16 ENCOUNTER — Inpatient Hospital Stay: Payer: BC Managed Care – PPO | Attending: Oncology | Admitting: Oncology

## 2021-02-16 ENCOUNTER — Other Ambulatory Visit: Payer: Self-pay

## 2021-02-16 VITALS — BP 136/82 | HR 69 | Temp 97.8°F | Resp 20 | Ht 66.0 in | Wt 161.2 lb

## 2021-02-16 DIAGNOSIS — C182 Malignant neoplasm of ascending colon: Secondary | ICD-10-CM | POA: Insufficient documentation

## 2021-02-16 DIAGNOSIS — C787 Secondary malignant neoplasm of liver and intrahepatic bile duct: Secondary | ICD-10-CM | POA: Diagnosis not present

## 2021-02-16 DIAGNOSIS — Z452 Encounter for adjustment and management of vascular access device: Secondary | ICD-10-CM | POA: Diagnosis not present

## 2021-02-16 DIAGNOSIS — Z95828 Presence of other vascular implants and grafts: Secondary | ICD-10-CM

## 2021-02-16 LAB — CEA (ACCESS): CEA (CHCC): 53.03 ng/mL — ABNORMAL HIGH (ref 0.00–5.00)

## 2021-02-16 MED ORDER — SODIUM CHLORIDE 0.9% FLUSH
10.0000 mL | Freq: Once | INTRAVENOUS | Status: AC
  Administered 2021-02-16: 10 mL

## 2021-02-16 MED ORDER — HEPARIN SOD (PORK) LOCK FLUSH 100 UNIT/ML IV SOLN
500.0000 [IU] | Freq: Once | INTRAVENOUS | Status: AC
  Administered 2021-02-16: 500 [IU]

## 2021-02-16 NOTE — Progress Notes (Signed)
[facility] OFFICE PROGRESS NOTE   Diagnosis: Colon cancer  INTERVAL HISTORY:   [name] returns as scheduled.  She reports improvement in her energy level.  She is working.  No seizures.  She fell in her bedroom on 02/08/2020.  She injured the left knee.  She was seen in the emergency room 02/10/2021.  She was noted to have an infected abrasion of the left knee.  She was placed on bacitracin and doxycycline.  The skin is healing.  Objective:  Vital signs in last 24 hours:  Blood pressure 136/82, pulse 69, temperature 97.8 F (36.6 C), temperature source Oral, resp. rate 20, height $RemoveBe'5\' 6"'IPSNOdnDj$  (1.676 m), weight 161 lb 3.2 oz (73.1 kg), SpO2 100 %.     Resp: Lungs clear bilaterally Cardio: Regular rate and rhythm GI: No hepatosplenomegaly, nontender, no mass Vascular: No leg edema  Skin: Healing abrasion inferior to the left patella.  No apparent knee effusion  Portacath/PICC-without erythema  Lab Results:  Lab Results  Component Value Date   WBC 1.9 (L) 01/19/2021   HGB 11.2 (L) 01/19/2021   HCT 36.4 01/19/2021   MCV 80.2 01/19/2021   PLT 105 (L) 01/19/2021   NEUTROABS 1.3 (L) 01/19/2021    CMP  Lab Results  Component Value Date   NA 137 01/08/2021   K 4.3 01/08/2021   CL 101 01/08/2021   CO2 21 01/08/2021   GLUCOSE 252 (H) 01/08/2021   BUN 11 01/08/2021   CREATININE 0.63 01/08/2021   CALCIUM 10.0 01/08/2021   PROT 6.6 01/08/2021   ALBUMIN 4.0 01/08/2021   AST 35 01/08/2021   ALT 29 01/08/2021   ALKPHOS 153 (H) 01/08/2021   BILITOT 0.6 01/08/2021   GFRNONAA >60 01/03/2021   GFRAA >60 11/05/2019    Lab Results  Component Value Date   CEA1 43.43 (H) 06/30/2020   CEA 53.03 (H) 02/16/2021   CA125 14 11/26/2014    Medications: I have reviewed the patient's current medications.   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 The  tumor returned microsatellite stable with no loss of mismatch repair protein expression   APC mutated. No BRAF, KRAS, or NRAS mutation On [facility] 1 testing   Cycle 1 adjuvant FOLFOX 09/08/2013   Cycle 2 adjuvant FOLFOX 09/24/2013   Cycle 3 adjuvant FOLFOX 10/08/2013.   Cycle 4 adjuvant FOLFOX 10/22/2013.   Cycle 5 adjuvant FOLFOX 11/05/2013. Oxaliplatin held due to thrombocytopenia. Cycle 6 FOLFOX 11/19/2013. Cycle 7 FOLFOX 12/03/2013. Oxaliplatin held secondary to thrombocytopenia. Cycle 8 FOLFOX 12/17/2013. Cycle 9 FOLFOX 01/04/2014. Oxaliplatin held secondary to neutropenia.   Cycle 10 FOLFOX 01/21/2014. Oxaliplatin held secondary to thrombocytopenia. Cycle 11 FOLFOX 02/04/2014 Cycle 12 FOLFOX 02/18/2014, oxaliplatin dose reduced secondary tothrombocytopenia CT abdomen/pelvis 01/30/2014 revealed splenomegaly and no evidence of recurrent colon cancer CT chest 04/07/2014 with a stable right lower lobe nodule and no evidence for metastatic disease, no nodules seen on the CT 11/26/2014 Markedly elevated CEA 11/24/2014 CT 11/26/2014 revealed a right pelvic mass, splenomegaly, small volume ascites Right salpingo-oophorectomy 12/28/2014 with the pathology confirming metastatic colon cancer CTs 03/23/2016-no evidence of recurrent or metastatic disease. CT 11/26/2016-enlargement of a fluid density structure the right pelvic sidewall, no other evidence of metastatic disease CT aspiration right pelvic cyst 12/19/2016.  Cytology-BENIGN REACTIVE/REPARATIVE CHANGES. CTs 06/05/2017- no evidence of metastatic disease, mild cirrhotic changes with splenomegaly CTs 12/11/2017- recurrent cystic right adnexal mass, similar to on the CT 11/26/2016,  stable mild splenomegaly CTs 04/23/2018- enlargement of cystic right adnexal mass with mural nodularity, no other evidence of metastatic disease Cytoreductive surgery/HIPEC with mitomycin by Dr. Clovis Riley at [facility] 08/12/2018-R1 resection achieved.  Cytoreduction  included omentectomy, LAR, right salpingo-oophorectomy and left colonic gutter/pelvic stripping.  Pathology on the rectum showed recurrent/metastatic adenocarcinoma, tumor 2.0 cm, predominantly involving the subserosa and muscularis propria of the colon, proximal and distal margins of resection were negative, vascular invasion present, metastatic carcinoma present in 1 out of 5 lymph nodes; omentum resection with no malignancy seen, no metastatic carcinoma identified in 1 lymph node examined; left gutter stripping positive metastatic adenocarcinoma; right ovary resection positive metastatic adenocarcinoma. 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 CEA 68 02/03/2019 Biopsy liver lesion 02/11/2019-metastatic adenocarcinoma consistent with primary colonic adenocarcinoma CTs 02/27/2019-multiple liver lesions increased in size, new lesion in the lateral right lobe of the liver.  No evidence of metastatic disease in the chest.  Redemonstrated moderate left hydronephrosis and proximal hydroureter without discrete lesion or obstructing etiology at the transition point of the mid ureter. Cycle 1 FOLFIRI/Panitumumab 03/12/2019 Cycle 2 FOLFIRI/Panitumumab 03/26/2019-bolus 5-FU and irinotecan held secondary to neutropenia Cycle 3 FOLFIRI/Panitumumab 04/09/2019, Udenyca added-not given secondary to seizure/discontinuation of the 5-FU pump CT abdomen/pelvis at [facility] 05/04/2019-no residual fluid collection at the left abdominal wall abscess, stable moderate left hydronephrosis, multifocal indeterminate liver lesions--liver lesions significantly improved Cycle 4 FOLFIRI/Panitumumab 05/07/2019, Udenyca Cycle 5 FOLFIRI/Panitumumab 05/20/2019, Udenyca Cycle 6 FOLFIRI/Panitumumab 06/18/2019, Udenyca Cycle 7 FOLFIRI/Panitumumab 07/01/2019 (Irinotecan dose reduced due to thrombocytopenia), Udenyca--Udenyca was not given Cycle 8 FOLFIRI/Panitumumab 07/15/2019,  Udenyca Cycle 9 FOLFIRI/Panitumumab 07/29/2019, Udenyca Cycle 10 FOLFIRI/Panitumumab 08/13/2019, Udenyca CTs at [facility] 08/19/2019-decreased size of the hypodense and hyperdense lesions within segment 2 of the left hepatic lobe.  No new lesions identified.  Similar presacral soft tissue thickening with adjacent stable alignment in the rectum.  Improved but persistent left UPJ obstruction with mild left hydronephrosis.  Persistent but decreased size of the wound within the left mid abdomen abdominal wall. Cycle 11 irinotecan/Panitumumab 08/27/2019, Udenyca Cycle 12 irinotecan/Panitumumab 10/08/2019, Udenyca Cycle 13 irinotecan/Panitumumab 11/05/2019, Udenyca CT abdomen/pelvis 11/19/2019-no new or progressive interval findings.  Stable appearance of the calcified lesion in the anterior left hepatic dome with second tiny low-density lesion in the dome of the lateral segment left liver.  Both lesions are markedly decreased since 02/27/2019.  Stable mild fullness left intrarenal collecting system and renal pelvis.  Similar appearance of abnormal soft tissue in the left paramidline subcutaneous fat with associated skin thickening, this likely correlates to the fistula. Cycle 14 irinotecan/Panitumumab 11/19/2019, Udenyca Cycle 15 irinotecan/Panitumumab 12/02/2019, Udenyca CT abdomen/pelvis at [facility] 02/11/2020- similar to slight decrease in segment 2 hypodense and hyperdense lesions (compared to a CT from July 2020), no new lesions, splenomegaly, no fluid collection or abscess, mild stranding adjacent to the incision with overlying skin thickening Cycle 16 irinotecan/Panitumumab 03/03/2020, Udenyca Cycle 17 irinotecan/Panitumumab 03/17/2020, Udenyca Cycle 18 irinotecan/panitumumab 04/07/2020, Udenyca Cycle 19 irinotecan/Panitumumab 04/28/2020, Udenyca Cycle 20 irinotecan/panitumumab 05/26/2020, Udenyca Cycle 21 irinotecan/panitumumab 06/30/2020, Udenyca CT abdomen/pelvis 07/19/2020-mildly increased hepatic  steatosis.  Increased size of 2.8 cm hypervascular lesion segment 2 left hepatic lobe. Cycle 22 irinotecan/Panitumumab 07/21/2020, Udenyca MRI Abd 07/28/2020: liver lesions consistent with hepatic metastasis Cycle 23 irinotecan/Panitumumab 08/04/2020, Udenyca  Cycle 24 irinotecan/panitumumab 08/22/2020 Irinotecan/panitumumab discontinued secondary to diarrhea and skin toxicity Guardant 360 on 09/12/2020-K-ras G12V, PIK3CA,NTRK2-VUS, tumor mutation burden 6.85, MSI-high not detected Referred for consideration of Y 90 CTs 10/11/2020-for  enlarging hepatic metastatic lesions identified.  No new lesions.  Several tiny nodules in the lungs in the 2 to 3 mm diameter range.  Mild splenomegaly.  Mild left hydronephrosis. 11/17/2020-Y90 lateral segment left lobe of the liver 12/08/2020-Y 90 treatment to the right liver Mild elevation of the CEA beginning January 2016 , normal on 05/19/2014   History of iron deficiency anemia   seizure disorder; seizure 04/09/2019.  Brain CT negative.  Now on Keppra. history of depression   4 mm right lower lobe nodule on a staging chest CT 09/08/2013 , stable on a CT 04/07/2014  Hospitalization 09/24/2013 through 09/26/2013 with fever and abdominal pain.   09/24/2013 urine culture positive for coag negative staph.   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  Scope advanced for approximately 25 cm.  Most proximal portion had necrotic appearing debris.  Colostomy bag insufflated suggesting some type of communication between the pouch and the proximal colon.  Introduction of scope into the ostomy found to available directions.  1 toward the distal pouch with similar appearing necrotic debris encountered.  The other was about a 30 cm segment of  normal-appearing colonic mucosa to the level of the previous right hemicolectomy ileocolonic anastomosis. Port-A-Cath placement interventional radiology 03/05/2019 Left leg/foot weakness-brain MRI 09/03/2019 with no evidence of metastatic disease, referred to physical therapy Possible abdominal wall abscess status post evaluation by surgery at [facility] 04/03/2019, antibiotics initiated; incision and drainage with purulent material removed 04/22/2019 CT at [facility] 05/04/2019-no fluid collection at the site of the left abdominal wall abscess, "indeterminate" liver lesions CT at [facility] 06/04/2019-persistent open wound of the left abdomen, 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  No malignancy.   16. COVID-19 + 09/10/2019 17. Hospitalized with seizure 09/17/2019 through 09/19/2019-MRI brain without evidence of metastatic disease. Seen by neurology. Keppra dose increased. 19.  Rash secondary to Panitumumab 20.  COVID-19 09/22/2020      Disposition: Ms. Reichardt appears stable.  She will be scheduled for a restaging evaluation in February.  She will return for an office visit in 1 month.  She will complete the course of antibiotics prescribed for the left knee abrasion.  Emily Coder, MD  02/16/2021  10:55 AM

## 2021-02-16 NOTE — Telephone Encounter (Signed)
Attempted to contact patient to schedule, per los 1/12. No answer so voicemail was left to call back to proceed scheduling.

## 2021-02-17 ENCOUNTER — Other Ambulatory Visit: Payer: Self-pay | Admitting: Neurology

## 2021-02-17 ENCOUNTER — Telehealth: Payer: Self-pay | Admitting: Oncology

## 2021-02-17 NOTE — Telephone Encounter (Signed)
2 Attempt in calling patient to schedule follow up appt, per 1/12 los. Appt was scheduled and voicemail was left as well. Letter will be sent out with reminders.

## 2021-02-20 ENCOUNTER — Telehealth: Payer: Self-pay | Admitting: Neurology

## 2021-02-20 NOTE — Telephone Encounter (Signed)
Patient said her medication is now $45 not 0. She was getting help with a program, she wants to know if there is something that needs to be done to get it 0 again. [place]

## 2021-02-20 NOTE — Telephone Encounter (Signed)
Advised pt not sure what it could be. $45 cheaper then most patient who are on the program now this year.  It could patient insurance could be paying this year for her medication.

## 2021-02-21 ENCOUNTER — Encounter: Payer: Self-pay | Admitting: Oncology

## 2021-02-22 ENCOUNTER — Encounter: Payer: Self-pay | Admitting: Oncology

## 2021-02-22 ENCOUNTER — Other Ambulatory Visit: Payer: Self-pay | Admitting: Interventional Radiology

## 2021-02-22 DIAGNOSIS — C189 Malignant neoplasm of colon, unspecified: Secondary | ICD-10-CM

## 2021-02-22 DIAGNOSIS — C787 Secondary malignant neoplasm of liver and intrahepatic bile duct: Secondary | ICD-10-CM

## 2021-02-22 NOTE — Telephone Encounter (Addendum)
My Chart message from Pt inquiring about Pet Scan. TC to Pt to confirm if Dr Benay Spice was the provider that was to put in the Pet Scan orders. Pt stated Dr Pascal Lux was the provider who was suppose to put in orders. Informed Pt then that providers office will schedule the Pet Scan. Pt verbalized understanding.

## 2021-02-28 ENCOUNTER — Telehealth: Payer: Self-pay

## 2021-02-28 ENCOUNTER — Encounter: Payer: Self-pay | Admitting: Oncology

## 2021-02-28 NOTE — Telephone Encounter (Signed)
Called the patient regarding the voicemail she left. Patient stated she need Dr. Benay Spice to help her with the Pet scan authorization. Advice the patient to call the office that put the order in. Patient voiced understanding

## 2021-03-01 ENCOUNTER — Encounter (HOSPITAL_BASED_OUTPATIENT_CLINIC_OR_DEPARTMENT_OTHER): Payer: Self-pay | Admitting: Family Medicine

## 2021-03-01 ENCOUNTER — Other Ambulatory Visit: Payer: Self-pay

## 2021-03-01 ENCOUNTER — Other Ambulatory Visit (INDEPENDENT_AMBULATORY_CARE_PROVIDER_SITE_OTHER): Payer: BC Managed Care – PPO

## 2021-03-01 DIAGNOSIS — R251 Tremor, unspecified: Secondary | ICD-10-CM | POA: Diagnosis not present

## 2021-03-01 LAB — FERRITIN: Ferritin: 11.1 ng/mL (ref 10.0–291.0)

## 2021-03-01 LAB — VITAMIN B12: Vitamin B-12: 829 pg/mL (ref 211–911)

## 2021-03-01 LAB — TSH: TSH: 3.14 u[IU]/mL (ref 0.35–5.50)

## 2021-03-01 NOTE — Progress Notes (Signed)
Please inform the patient that Ferritin is on the lower side, needs daily iron supplements over the counter, B12 and thyroid are normal . Will send lab results to the primary doctor, thanks

## 2021-03-01 NOTE — Telephone Encounter (Signed)
Patient requires appointment to assess

## 2021-03-02 NOTE — Progress Notes (Signed)
Pt advised of her lab resutls. Labs sent to PCP via Epic.

## 2021-03-08 ENCOUNTER — Ambulatory Visit (HOSPITAL_COMMUNITY): Admission: RE | Admit: 2021-03-08 | Payer: BC Managed Care – PPO | Source: Ambulatory Visit

## 2021-03-08 ENCOUNTER — Other Ambulatory Visit: Payer: Self-pay | Admitting: Nurse Practitioner

## 2021-03-08 DIAGNOSIS — C787 Secondary malignant neoplasm of liver and intrahepatic bile duct: Secondary | ICD-10-CM

## 2021-03-08 DIAGNOSIS — C182 Malignant neoplasm of ascending colon: Secondary | ICD-10-CM

## 2021-03-09 ENCOUNTER — Other Ambulatory Visit: Payer: Self-pay

## 2021-03-09 ENCOUNTER — Other Ambulatory Visit: Payer: Self-pay | Admitting: Interventional Radiology

## 2021-03-09 ENCOUNTER — Other Ambulatory Visit: Payer: Self-pay | Admitting: *Deleted

## 2021-03-09 DIAGNOSIS — C787 Secondary malignant neoplasm of liver and intrahepatic bile duct: Secondary | ICD-10-CM

## 2021-03-09 DIAGNOSIS — C182 Malignant neoplasm of ascending colon: Secondary | ICD-10-CM

## 2021-03-09 DIAGNOSIS — C189 Malignant neoplasm of colon, unspecified: Secondary | ICD-10-CM

## 2021-03-09 MED ORDER — LORAZEPAM 0.5 MG PO TABS: 0.5000 mg | ORAL_TABLET | Freq: Two times a day (BID) | ORAL | 0 refills | Status: DC | PRN

## 2021-03-09 MED ORDER — MAGNESIUM OXIDE -MG SUPPLEMENT 400 (240 MG) MG PO TABS: 1.0000 | ORAL_TABLET | Freq: Two times a day (BID) | ORAL | 0 refills | Status: DC

## 2021-03-14 ENCOUNTER — Encounter: Payer: Self-pay | Admitting: Oncology

## 2021-03-16 ENCOUNTER — Encounter (HOSPITAL_BASED_OUTPATIENT_CLINIC_OR_DEPARTMENT_OTHER): Payer: Self-pay | Admitting: Family Medicine

## 2021-03-16 ENCOUNTER — Encounter: Payer: Self-pay | Admitting: Nurse Practitioner

## 2021-03-16 ENCOUNTER — Inpatient Hospital Stay: Payer: BC Managed Care – PPO | Admitting: Nurse Practitioner

## 2021-03-16 ENCOUNTER — Encounter (HOSPITAL_COMMUNITY): Payer: Self-pay

## 2021-03-16 ENCOUNTER — Ambulatory Visit (HOSPITAL_COMMUNITY)
Admission: RE | Admit: 2021-03-16 | Discharge: 2021-03-16 | Disposition: A | Discharge status: Home / Self Care | Payer: BC Managed Care – PPO | Source: Ambulatory Visit | Attending: Interventional Radiology | Admitting: Interventional Radiology

## 2021-03-16 ENCOUNTER — Inpatient Hospital Stay: Payer: BC Managed Care – PPO

## 2021-03-16 ENCOUNTER — Ambulatory Visit (INDEPENDENT_AMBULATORY_CARE_PROVIDER_SITE_OTHER): Payer: BC Managed Care – PPO | Admitting: Family Medicine

## 2021-03-16 ENCOUNTER — Other Ambulatory Visit: Payer: Self-pay

## 2021-03-16 ENCOUNTER — Inpatient Hospital Stay: Payer: BC Managed Care – PPO | Attending: Oncology

## 2021-03-16 VITALS — BP 130/76 | HR 83 | Ht 66.0 in | Wt 157.7 lb

## 2021-03-16 VITALS — BP 127/82 | HR 100 | Temp 97.8°F | Resp 18 | Ht 66.0 in | Wt 157.6 lb

## 2021-03-16 DIAGNOSIS — C182 Malignant neoplasm of ascending colon: Secondary | ICD-10-CM | POA: Diagnosis not present

## 2021-03-16 DIAGNOSIS — C189 Malignant neoplasm of colon, unspecified: Secondary | ICD-10-CM | POA: Diagnosis not present

## 2021-03-16 DIAGNOSIS — D509 Iron deficiency anemia, unspecified: Secondary | ICD-10-CM | POA: Diagnosis not present

## 2021-03-16 DIAGNOSIS — C787 Secondary malignant neoplasm of liver and intrahepatic bile duct: Secondary | ICD-10-CM | POA: Diagnosis not present

## 2021-03-16 DIAGNOSIS — R911 Solitary pulmonary nodule: Secondary | ICD-10-CM | POA: Diagnosis not present

## 2021-03-16 DIAGNOSIS — K769 Liver disease, unspecified: Secondary | ICD-10-CM | POA: Diagnosis not present

## 2021-03-16 DIAGNOSIS — R918 Other nonspecific abnormal finding of lung field: Secondary | ICD-10-CM | POA: Diagnosis not present

## 2021-03-16 DIAGNOSIS — E1165 Type 2 diabetes mellitus with hyperglycemia: Secondary | ICD-10-CM | POA: Diagnosis not present

## 2021-03-16 DIAGNOSIS — I700 Atherosclerosis of aorta: Secondary | ICD-10-CM | POA: Diagnosis not present

## 2021-03-16 LAB — CMP (CANCER CENTER ONLY)
ALT: 19 U/L (ref 0–44)
AST: 24 U/L (ref 15–41)
Albumin: 3.8 g/dL (ref 3.5–5.0)
Alkaline Phosphatase: 147 U/L — ABNORMAL HIGH (ref 38–126)
Anion gap: 13 (ref 5–15)
BUN: 12 mg/dL (ref 6–20)
CO2: 22 mmol/L (ref 22–32)
Calcium: 9.4 mg/dL (ref 8.9–10.3)
Chloride: 102 mmol/L (ref 98–111)
Creatinine: 0.51 mg/dL (ref 0.44–1.00)
GFR, Estimated: >60 mL/min (ref >60)
Glucose, Bld: 297 mg/dL — ABNORMAL HIGH (ref 70–99)
Potassium: 3.4 mmol/L — ABNORMAL LOW (ref 3.5–5.1)
Sodium: 137 mmol/L (ref 135–145)
Total Bilirubin: 0.8 mg/dL (ref 0.3–1.2)
Total Protein: 7.2 g/dL (ref 6.5–8.1)

## 2021-03-16 MED ORDER — IOHEXOL 300 MG/ML  SOLN
100.0000 mL | Freq: Once | INTRAMUSCULAR | Status: AC | PRN
  Administered 2021-03-16: 100 mL via INTRAVENOUS

## 2021-03-16 MED ORDER — METFORMIN HCL 500 MG PO TABS: 500.0000 mg | ORAL_TABLET | Freq: Two times a day (BID) | ORAL | 0 refills | Status: DC

## 2021-03-16 MED ORDER — HEPARIN SOD (PORK) LOCK FLUSH 100 UNIT/ML IV SOLN
500.0000 [IU] | Freq: Once | INTRAVENOUS | Status: AC
  Administered 2021-03-16: 500 [IU] via INTRAVENOUS

## 2021-03-16 MED ORDER — SODIUM CHLORIDE (PF) 0.9 % IJ SOLN
INTRAMUSCULAR | Status: AC
  Filled 2021-03-16: qty 50

## 2021-03-16 MED ORDER — HEPARIN SOD (PORK) LOCK FLUSH 100 UNIT/ML IV SOLN
INTRAVENOUS | Status: AC
  Filled 2021-03-16: qty 5

## 2021-03-16 NOTE — Assessment & Plan Note (Signed)
[name] initially seen at establishing visit in April 2022 with reported new diagnosis of diabetes Was recommended to follow-up in 1 month after that visit for further management, however has been lost to follow-up thus complicating care Reports that [name] continues with metformin, currently taking 1000 mg twice daily No recent hemoglobin A1c, previous in April 2022 was elevated at 8.6% Was previously referred to nutritionist, however was not able to establish, is still interested in this [name] is reporting some loose stool/diarrhea, [name] is unsure if this may be related to metformin or her chemotherapy At this time we will decrease dose of metformin to 500 mg twice daily and see if this leads to any improvement in her GI symptoms We will check A1c today as well as lipid panel We will refer [name] to nutritionist Discussed with [name] need for close follow-up in order to ensure that chronic medical issues including diabetes are appropriately monitored and addressed Plan for follow-up in about 1 month to follow-up on above

## 2021-03-16 NOTE — Progress Notes (Signed)
° ° °  Procedures performed today:    None.  Independent interpretation of notes and tests performed by another provider:   None.  Brief History, Exam, Impression, and Recommendations:    BP 130/76    Pulse 83    Ht 5\' 6"  (1.676 m)    Wt 157 lb 11.2 oz (71.5 kg)    SpO2 99%    BMI 25.45 kg/m   Diabetes ([place]) Patient initially seen at establishing visit in April 2022 with reported new diagnosis of diabetes Was recommended to follow-up in 1 month after that visit for further management, however has been lost to follow-up thus complicating care Reports that she continues with metformin, currently taking 1000 mg twice daily No recent hemoglobin A1c, previous in April 2022 was elevated at 8.6% Was previously referred to nutritionist, however was not able to establish, is still interested in this She is reporting some loose stool/diarrhea, she is unsure if this may be related to metformin or her chemotherapy At this time we will decrease dose of metformin to 500 mg twice daily and see if this leads to any improvement in her GI symptoms We will check A1c today as well as lipid panel We will refer patient to nutritionist Discussed with patient need for close follow-up in order to ensure that chronic medical issues including diabetes are appropriately monitored and addressed Plan for follow-up in about 1 month to follow-up on above  Anemia, iron deficiency Reports being advised previously on taking oral iron supplementation to help with anemia as well as restless leg syndrome.  Endorses having some constipation related to this and thus remains hesitant about regular oral iron supplementation  Plan for follow-up in about 1 month or sooner as needed   ___________________________________________ Jesi Jurgens [name], MD, ABFM, [facility] and [place]

## 2021-03-16 NOTE — Assessment & Plan Note (Signed)
Reports being advised previously on taking oral iron supplementation to help with anemia as well as restless leg syndrome.  Endorses having some constipation related to this and thus remains hesitant about regular oral iron supplementation

## 2021-03-16 NOTE — Progress Notes (Signed)
[place] OFFICE PROGRESS NOTE   Diagnosis: Colon cancer  INTERVAL HISTORY:   Ms. Pavlock returns as scheduled.  She is scheduled for restaging CT scans later today.  She complains of fatigue.  She has periodic nausea.  Bowels are moving.  She notes some cracking of the skin at the fingertips.  She has developed "knots" on the labia.  Objective:  Vital signs in last 24 hours:  Blood pressure 127/82, pulse 100, temperature 97.8 F (36.6 C), temperature source Oral, resp. rate 18, height 5' 6"  (1.676 m), weight 157 lb 9.6 oz (71.5 kg), SpO2 100 %.    HEENT: No thrush or ulcers.  Geographic tongue. Lymphatics: No palpable cervical, supraclavicular or axillary lymph nodes. Resp: Lungs clear bilaterally. Cardio: Regular rate and rhythm. GI: Abdomen soft and nontender.  No hepatosplenomegaly.  No mass. Vascular: No leg edema. Skin: Mild dryness at the distal portion of the fingertips. GU: Small cystic-appearing lesion left upper labia.   Lab Results:  Lab Results  Component Value Date   WBC 1.9 (L) 01/19/2021   HGB 11.2 (L) 01/19/2021   HCT 36.4 01/19/2021   MCV 80.2 01/19/2021   PLT 105 (L) 01/19/2021   NEUTROABS 1.3 (L) 01/19/2021    Imaging:  No results found.  Medications: I have reviewed the patient's current medications.  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 The tumor returned microsatellite stable with no loss of mismatch repair protein expression   APC mutated. No BRAF, KRAS, or NRAS mutation On Foundation 1 testing   Cycle 1 adjuvant FOLFOX 09/08/2013   Cycle 2 adjuvant FOLFOX 09/24/2013   Cycle 3 adjuvant FOLFOX 10/08/2013.   Cycle 4 adjuvant FOLFOX 10/22/2013.   Cycle 5 adjuvant FOLFOX 11/05/2013. Oxaliplatin held due to thrombocytopenia. Cycle 6 FOLFOX 11/19/2013. Cycle 7 FOLFOX 12/03/2013. Oxaliplatin held secondary  to thrombocytopenia. Cycle 8 FOLFOX 12/17/2013. Cycle 9 FOLFOX 01/04/2014. Oxaliplatin held secondary to neutropenia.   Cycle 10 FOLFOX 01/21/2014. Oxaliplatin held secondary to thrombocytopenia. Cycle 11 FOLFOX 02/04/2014 Cycle 12 FOLFOX 02/18/2014, oxaliplatin dose reduced secondary tothrombocytopenia CT abdomen/pelvis 01/30/2014 revealed splenomegaly and no evidence of recurrent colon cancer CT chest 04/07/2014 with a stable right lower lobe nodule and no evidence for metastatic disease, no nodules seen on the CT 11/26/2014 Markedly elevated CEA 11/24/2014 CT 11/26/2014 revealed a right pelvic mass, splenomegaly, small volume ascites Right salpingo-oophorectomy 12/28/2014 with the pathology confirming metastatic colon cancer CTs 03/23/2016-no evidence of recurrent or metastatic disease. CT 11/26/2016-enlargement of a fluid density structure the right pelvic sidewall, no other evidence of metastatic disease CT aspiration right pelvic cyst 12/19/2016.  Cytology-BENIGN REACTIVE/REPARATIVE CHANGES. CTs 06/05/2017- no evidence of metastatic disease, mild cirrhotic changes with splenomegaly CTs 12/11/2017- recurrent cystic right adnexal mass, similar to on the CT 11/26/2016, stable mild splenomegaly CTs 04/23/2018- enlargement of cystic right adnexal mass with mural nodularity, no other evidence of metastatic disease Cytoreductive surgery/HIPEC with mitomycin by Dr. Clovis Riley at [facility] 08/12/2018-R1 resection achieved.  Cytoreduction included omentectomy, LAR, right salpingo-oophorectomy and left colonic gutter/pelvic stripping.  Pathology on the rectum showed recurrent/metastatic adenocarcinoma, tumor 2.0 cm, predominantly involving the subserosa and muscularis propria of the colon, proximal and distal margins of resection were negative, vascular invasion present, metastatic carcinoma present in 1 out of 5 lymph nodes; omentum resection with no malignancy seen, no metastatic carcinoma identified in 1 lymph  node examined; left gutter stripping positive metastatic adenocarcinoma; right ovary resection positive metastatic  adenocarcinoma. 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 CEA 68 02/03/2019 Biopsy liver lesion 02/11/2019-metastatic adenocarcinoma consistent with primary colonic adenocarcinoma CTs 02/27/2019-multiple liver lesions increased in size, new lesion in the lateral right lobe of the liver.  No evidence of metastatic disease in the chest.  Redemonstrated moderate left hydronephrosis and proximal hydroureter without discrete lesion or obstructing etiology at the transition point of the mid ureter. Cycle 1 FOLFIRI/Panitumumab 03/12/2019 Cycle 2 FOLFIRI/Panitumumab 03/26/2019-bolus 5-FU and irinotecan held secondary to neutropenia Cycle 3 FOLFIRI/Panitumumab 04/09/2019, Udenyca added-not given secondary to seizure/discontinuation of the 5-FU pump CT abdomen[facility]pelvis at [facility] 05/04/2019-no residual fluid collection at the left abdominal wall abscess, stable moderate left hydronephrosis, multifocal indeterminate liver lesions--liver lesions significantly improved Cycle 4 FOLFIRI/Panitumumab 05/07/2019, Udenyca Cycle 5 FOLFIRI/Panitumumab 05/20/2019, Udenyca Cycle 6 FOLFIRI/Panitumumab 06/18/2019, Udenyca Cycle 7 FOLFIRI/Panitumumab 07/01/2019 (Irinotecan dose reduced due to thrombocytopenia), Udenyca--Udenyca was not given Cycle 8 FOLFIRI/Panitumumab 07/15/2019, Udenyca Cycle 9 FOLFIRI/Panitumumab 07/29/2019, Udenyca Cycle 10 FOLFIRI/Panitumumab 08/13/2019, Udenyca CTs at [facility] 08/19/2019-decreased size of the hypodense and hyperdense lesions within segment 2 of the left hepatic lobe.  No new lesions identified.  Similar presacral soft tissue thickening with adjacent stable alignment in the rectum.  Improved but persistent left UPJ obstruction with mild left hydronephrosis.  Persistent but decreased size of the wound within the left  mid abdomen abdominal wall. Cycle 11 irinotecan/Panitumumab 08/27/2019, Udenyca Cycle 12 irinotecan/Panitumumab 10/08/2019, Udenyca Cycle 13 irinotecan/Panitumumab 11/05/2019, Udenyca CT abdomen/pelvis 11/19/2019-no new or progressive interval findings.  Stable appearance of the calcified lesion in the anterior left hepatic dome with second tiny low-density lesion in the dome of the lateral segment left liver.  Both lesions are markedly decreased since 02/27/2019.  Stable mild fullness left intrarenal collecting system and renal pelvis.  Similar appearance of abnormal soft tissue in the left paramidline subcutaneous fat with associated skin thickening, this likely correlates to the fistula. Cycle 14 irinotecan/Panitumumab 11/19/2019, Udenyca Cycle 15 irinotecan/Panitumumab 12/02/2019, Udenyca CT abdomen/pelvis at [facility] 02/11/2020- similar to slight decrease in segment 2 hypodense and hyperdense lesions (compared to a CT from July 2020), no new lesions, splenomegaly, no fluid collection or abscess, mild stranding adjacent to the incision with overlying skin thickening Cycle 16 irinotecan/Panitumumab 03/03/2020, Udenyca Cycle 17 irinotecan/Panitumumab 03/17/2020, Udenyca Cycle 18 irinotecan/panitumumab 04/07/2020, Udenyca Cycle 19 irinotecan/Panitumumab 04/28/2020, Udenyca Cycle 20 irinotecan/panitumumab 05/26/2020, Udenyca Cycle 21 irinotecan/panitumumab 06/30/2020, Udenyca CT abdomen/pelvis 07/19/2020-mildly increased hepatic steatosis.  Increased size of 2.8 cm hypervascular lesion segment 2 left hepatic lobe. Cycle 22 irinotecan/Panitumumab 07/21/2020, Udenyca MRI Abd 07/28/2020: liver lesions consistent with hepatic metastasis Cycle 23 irinotecan/Panitumumab 08/04/2020, Udenyca  Cycle 24 irinotecan/panitumumab 08/22/2020 Irinotecan/panitumumab discontinued secondary to diarrhea and skin toxicity Guardant 360 on 09/12/2020-K-ras G12V, PIK3CA,NTRK2-VUS, tumor mutation burden 6.85, MSI-high not  detected Referred for consideration of Y 90 CTs 10/11/2020-for enlarging hepatic metastatic lesions identified.  No new lesions.  Several tiny nodules in the lungs in the 2 to 3 mm diameter range.  Mild splenomegaly.  Mild left hydronephrosis. 11/17/2020-Y90 lateral segment left lobe of the liver 12/08/2020-Y 90 treatment to the right liver CTs 03/16/2021-pending Mild elevation of the CEA beginning January 2016 , normal on 05/19/2014   History of iron deficiency anemia   seizure disorder; seizure 04/09/2019.  Brain CT negative.  Now on Keppra. history of depression   4 mm right lower lobe nodule on a staging chest CT 09/08/2013 , stable on a CT 04/07/2014  Hospitalization 09/24/2013 through 09/26/2013 with fever and abdominal  pain.   09/24/2013 urine culture positive for coag negative staph.   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  Scope advanced for approximately 25 cm.  Most proximal portion had necrotic appearing debris.  Colostomy bag insufflated suggesting some type of communication between the pouch and the proximal colon.  Introduction of scope into the ostomy found to available directions.  1 toward the distal pouch with similar appearing necrotic debris encountered.  The other was about a 30 cm segment of normal-appearing colonic mucosa to the level of the previous right hemicolectomy ileocolonic anastomosis. Port-A-Cath placement interventional radiology 03/05/2019 Left leg/foot weakness-brain MRI 09/03/2019 with no evidence of metastatic disease, referred to physical therapy Possible abdominal wall abscess status post evaluation by surgery at [facility] 04/03/2019, antibiotics initiated; incision and drainage with purulent material removed 04/22/2019 CT  at [facility] 05/04/2019-no fluid collection at the site of the left abdominal wall abscess, "indeterminate" liver lesions CT at [facility] 06/04/2019-persistent open wound of the left abdomen, 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  No malignancy.   16. COVID-19 + 09/10/2019 17. Hospitalized with seizure 09/17/2019 through 09/19/2019-MRI brain without evidence of metastatic disease. Seen by neurology. Keppra dose increased. 19.  Rash secondary to Panitumumab 20.  COVID-19 09/22/2020    Disposition: [name] appears unchanged.  She is scheduled for restaging CT scans later today.  We will follow-up on the results.  She appears to have a small cyst at the left labia.  She will try warm compresses.  She will return for an office visit in 2 weeks.  We are available to see her sooner if needed.    Ned Card ANP/GNP-BC   03/16/2021  9:57 AM

## 2021-03-16 NOTE — Patient Instructions (Signed)
°  Medication Instructions:  Your physician has recommended you make the following change in your medication:  -- DECREASE Metformin - Take 1 tablet (500 mg) by mouth twice daily --If you need a refill on any your medications before your next appointment, please call your pharmacy first. If no refills are authorized on file call the office.-- Lab Work: Your physician has recommended that you have lab work today: A1C and Lipid Panel If you have labs (blood work) drawn today and your tests are completely normal, you will receive your results via [address] a phone call from our staff.  Please ensure you check your voicemail in the event that you authorized detailed messages to be left on a delegated number. If you have any lab test that is abnormal or we need to change your treatment, we will call you to review the results.  Referrals/Procedures/Imaging: A referral has been placed for you to Medical Nutrition Therapy for evaluation and treatment. Someone from the scheduling department will be in contact with you in regards to coordinating your consultation. If you do not hear from any of the schedulers within 7-10 business days please give their office a call.  Follow-Up: Your next appointment:   Your physician recommends that you schedule a follow-up appointment in: 1 MONTH with Dr. [name]  You will receive a text message or e-mail with a link to a survey about your care and experience with [place] today! We would greatly appreciate your feedback!   Thanks for letting us be apart of your health journey!!  Primary Care and Sports Medicine   Dr. Arlina Robes [place]   We encourage you to activate your patient portal called "MyChart".  Sign up information is provided on this After Visit Summary.  MyChart is used to connect with patients for Virtual Visits (Telemedicine).  Patients are able to view lab/test results, encounter notes, upcoming appointments, etc.  Non-urgent messages can be sent to your  provider as well. To learn more about what you can do with MyChart, please visit --  [email or web address].

## 2021-03-16 NOTE — Patient Instructions (Signed)

## 2021-03-17 ENCOUNTER — Telehealth: Payer: BC Managed Care – PPO

## 2021-03-17 ENCOUNTER — Encounter: Payer: Self-pay | Admitting: *Deleted

## 2021-03-17 ENCOUNTER — Ambulatory Visit
Admission: RE | Admit: 2021-03-17 | Discharge: 2021-03-17 | Disposition: A | Discharge status: Home / Self Care | Payer: BC Managed Care – PPO | Source: Ambulatory Visit | Attending: Interventional Radiology | Admitting: Interventional Radiology

## 2021-03-17 DIAGNOSIS — C182 Malignant neoplasm of ascending colon: Secondary | ICD-10-CM | POA: Diagnosis not present

## 2021-03-17 DIAGNOSIS — C787 Secondary malignant neoplasm of liver and intrahepatic bile duct: Secondary | ICD-10-CM

## 2021-03-17 DIAGNOSIS — C189 Malignant neoplasm of colon, unspecified: Secondary | ICD-10-CM

## 2021-03-17 HISTORY — PX: IR RADIOLOGIST EVAL & MGMT: IMG5224

## 2021-03-17 NOTE — Progress Notes (Signed)
Patient ID: [name], female   DOB: 07-Nov-1975, 46 y.o.   MRN: [id number]        Chief Complaint: Metastatic colon cancer, post Y90 radioembolization.  Referring Physician(s): Larrie Lucia  History of Present Illness:  [name] is a 46 y.o. female with complex past medical history significant for seizures and metastatic colon cancer involving the ascending colon, post laparoscopic right colectomy on 08/11/2013 post Y90 radioembolization with completion therapy performed 12/08/2020.  Patient is seen today in telemedicine consultation following acquisition of postprocedural CT scan of the chest, abdomen pelvis performed 03/16/2021  In brief review, the patient has undergone multiple rounds of chemotherapy, most recently treated with irinotecan/panitumumab, complicated by diarrhea and skin toxicity.  She has been evaluated by surgical oncology at [facility] but has been deemed not a candidate for liver resection, hepatic infusion pump or ablation.  Due to the patient's request to take a break from chemotherapy (though states that she may consider repeat treatment with FOLFOX in the future), she was evaluated by interventional radiology on 10/06/2020 for Y 90 radioembolization, initially undergoing mapping/planning Y 90 radioembolization on 11/03/2020 followed by Y 90 radioembolization of the lateral segment of the left lobe of the liver on 11/17/2020 and completion of Y 90 radioembolization of both the medial segment of the left lobe of the liver as well as the right lobe of the liver on 12/08/2020.  Patient continues to report baseline level of fatigue.  She reports occasional left-sided pelvic pain while having a bowel movement.  Patient is otherwise without complaint.  Specifically, no unintentional weight loss.  Appetite is maintained.  No worsening abdominal pain.  No yellowing of the skin or eyes.   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   Restless leg syndrome    Seizures ([place])    last seizure 08/2012    Past Surgical History:  Procedure Laterality Date   ABDOMINAL HYSTERECTOMY  08/24/2004   partial   APPENDECTOMY  08/24/2004   COLON SURGERY  08/11/2013   removed a foot of colon   COLONOSCOPY  2019   colonoscopy 10-18-14     COLONOSCOPY WITH PROPOFOL  07/07/2013   CYSTOSCOPY  11/01/2003   ENDOMETRIAL FULGURATION  11/01/2003   ESOPHAGOGASTRODUODENOSCOPY  07/07/2013   IR ANGIOGRAM SELECTIVE EACH ADDITIONAL VESSEL  11/03/2020   IR ANGIOGRAM SELECTIVE EACH ADDITIONAL VESSEL  11/03/2020   IR ANGIOGRAM SELECTIVE EACH ADDITIONAL VESSEL  11/03/2020   IR ANGIOGRAM SELECTIVE EACH ADDITIONAL VESSEL  11/03/2020   IR ANGIOGRAM SELECTIVE EACH ADDITIONAL VESSEL  11/17/2020   IR ANGIOGRAM SELECTIVE EACH ADDITIONAL VESSEL  11/17/2020   IR ANGIOGRAM SELECTIVE EACH ADDITIONAL VESSEL  11/17/2020   IR ANGIOGRAM SELECTIVE EACH ADDITIONAL VESSEL  12/08/2020   IR ANGIOGRAM SELECTIVE EACH ADDITIONAL VESSEL  12/08/2020   IR ANGIOGRAM VISCERAL SELECTIVE  11/03/2020   IR ANGIOGRAM VISCERAL SELECTIVE  11/03/2020   IR ANGIOGRAM VISCERAL SELECTIVE  11/17/2020   IR ANGIOGRAM VISCERAL SELECTIVE  12/08/2020   IR EMBO ARTERIAL NOT HEMORR HEMANG INC GUIDE ROADMAPPING  11/03/2020   IR EMBO TUMOR ORGAN ISCHEMIA INFARCT INC GUIDE ROADMAPPING  11/17/2020   IR EMBO TUMOR ORGAN ISCHEMIA INFARCT INC GUIDE ROADMAPPING  12/08/2020   IR IMAGING GUIDED PORT INSERTION  03/05/2019   IR RADIOLOGIST EVAL & MGMT  10/06/2020   IR RADIOLOGIST EVAL & MGMT  01/17/2021   IR US GUIDE VASC ACCESS RIGHT  11/03/2020   IR US GUIDE VASC ACCESS RIGHT  11/17/2020    IR US GUIDE VASC ACCESS RIGHT  12/08/2020   LAPAROSCOPIC LYSIS OF ADHESIONS  11/01/2003   LAPAROSCOPIC PARTIAL COLECTOMY Right 08/11/2013   Procedure: LAPAROSCOPIC RIGHT HEMICOLECTOMY;  Surgeon: Stark Klein, MD;  Location: [place];  Service: General;  Laterality: Right;   LAPAROTOMY N/A 12/28/2014   Procedure: EXPLORATORY LAPAROTOMY;  Surgeon: Everitt Amber, MD;  Location: [facility];  Service: Gynecology;  Laterality: N/A;   LEFT OOPHORECTOMY  08/24/2004   PANNICULECTOMY  08/24/2004   PORT-A-CATH REMOVAL Left 06/08/2014   Procedure: REMOVAL PORT-A-CATH;  Surgeon: Stark Klein, MD;  Location: [place];  Service: General;  Laterality: Left;   PORTACATH PLACEMENT Left 09/09/2013   Procedure: INSERTION PORT-A-CATH;  Surgeon: Stark Klein, MD;  Location: [place];  Service: General;  Laterality: Left;   SALPINGOOPHORECTOMY Right 12/28/2014   Procedure: RIGHT SALPINGO OOPHORECTOMY;  Surgeon: Everitt Amber, MD;  Location: [facility];  Service: Gynecology;  Laterality: Right;   TUBAL LIGATION  11/07/2000   UNILATERAL SALPINGECTOMY Left 08/24/2004   WISDOM TOOTH EXTRACTION Bilateral 1994    Allergies: Adhesive [tape], Clindamycin/lincomycin, Promethazine, Eggs or egg-derived products, Metoclopramide hcl, Oxycodone-acetaminophen, Sulfa antibiotics, Fluogen [influenza virus vaccine], Levaquin [levofloxacin], and Penicillins  Medications: Prior to Admission medications   Medication Sig Start Date End Date Taking? Authorizing Provider  Accu-Chek Softclix Lancets lancets SMARTSIG:2 Topical Twice Daily 07/22/20   [provider]  acetaminophen (TYLENOL) 500 MG tablet Take 1,000 mg by mouth every 6 (six) hours as needed.    [provider]  bacitracin ointment Apply 1 application topically 2 (two) times daily. Patient not taking: Reported on 03/16/2021 02/10/21   Jaynee Eagles, PA-C  blood glucose meter kit and supplies Dispense based on patient and insurance preference. Use up to four times daily as  directed. (FOR ICD-10 E10.9, E11.9). 07/22/20   [name], Raymond J, MD  CINNAMON PO Take 2,000 mg by mouth in the morning and at bedtime.    [provider]  diphenhydrAMINE (BENADRYL) 50 MG tablet Take 100 mg by mouth at bedtime as needed for itching or sleep.    [provider]  diphenoxylate-atropine (LOMOTIL) 2.5-0.025 MG tablet TAKE 1- 2 TABLETS BY MOUTH 4 TIMES DAILY AS NEEDED FOR DIARRHEA /  LOOSE  STOOLS 08/23/20   [name], MD  doxycycline (VIBRAMYCIN) 100 MG capsule Take 1 capsule (100 mg total) by mouth 2 (two) times daily. 02/10/21   Jaynee Eagles, PA-C  Famotidine (PEPCID PO) Take 1 tablet by mouth as needed.    [provider]  ferrous sulfate 325 (65 FE) MG tablet Take 325 mg by mouth daily with breakfast.    [provider]  hydrOXYzine (ATARAX/VISTARIL) 25 MG tablet Take 1 tablet (25 mg total) by mouth every 6 (six) hours. 12/03/20   Mickie Hillier, PA-C  lacosamide (VIMPAT) 200 MG TABS tablet Take 1 tablet by mouth twice daily 02/17/21   [name], DO  lidocaine-prilocaine (EMLA) cream Apply 1 application topically as directed. Apply to port site 1 hour prior to stick and cover  with plastic wrap 10/22/19   [name], MD  loperamide (IMODIUM) 2 MG capsule Take 2 mg by mouth as needed for diarrhea or loose stools.    [provider]  LORazepam (ATIVAN) 0.5 MG tablet Take 1 tablet (0.5 mg total) by mouth 2 (two) times daily as needed. for anxiety 03/09/21   [name], MD  magnesium oxide (MAG-OX) 400 (240 Mg) MG tablet Take 1 tablet (400 mg total) by mouth 2 (two) times daily. 03/09/21   [name], MD  metFORMIN (GLUCOPHAGE) 500 MG tablet Take 1 tablet (500 mg total) by mouth 2 (two) times daily with a meal. 03/16/21   [name], [name], MD  naproxen (NAPROSYN) 500 MG tablet Take 1 tablet (500 mg total) by mouth 2 (two) times daily with a meal. Patient not taking: Reported on 03/16/2021 02/10/21   Jaynee Eagles, PA-C   omeprazole (PRILOSEC) 40 MG capsule Take 40 mg by mouth as needed.    [provider]  Polyethylene Glycol 3350 (MIRALAX PO) Take 17 g by mouth daily as needed.    [provider]  prochlorperazine (COMPAZINE) 10 MG tablet Take 1 tablet (10 mg total) by mouth every 6 (six) hours as needed for nausea or vomiting. 12/20/20   Owens Shark, NP  vitamin B-12 (CYANOCOBALAMIN) 1000 MCG tablet Take 1,000 mcg by mouth daily.    [provider]     Family History  Problem Relation Age of Onset   Colon polyps Mother        3+ colon polyps at each colonoscopy - "several"   Colitis Mother    Heart attack Mother    Diabetes Mother    Heart disease Mother    Diabetes Father    Heart attack Father    Stroke Father    Congestive Heart Failure Father    Heart disease Father    Cirrhosis Maternal Aunt    Lung cancer Maternal Uncle        d. 8s; smoker and worked around asbestos   Other Paternal Grandfather        "bone cancer"   Colon cancer Maternal Uncle        dx early 69s   Breast cancer Maternal Aunt        dx 50s-60s; s/p lumpectomy   Dementia Maternal Aunt    Other Cousin        maternal 1st cousin w/ "lung issues"   Dementia Paternal Aunt    Dementia Paternal Uncle    Prostate cancer Paternal Uncle 78   Esophageal cancer Neg Hx    Rectal cancer Neg Hx    Stomach cancer Neg Hx     Social History   Socioeconomic History   Marital status: Married    Spouse name: Not on file   Number of children: 2   Years of education: Not on file   Highest education level: Not on file  Occupational History   Occupation: Unemployed- full time student    Comment: Completed 12th grade  Tobacco Use   Smoking status: Never   Smokeless tobacco: Never  Vaping Use   Vaping Use: Never used  Substance and Sexual Activity   Alcohol use: No   Drug use: No   Sexual activity: Yes    Birth control/protection: Surgical  Other Topics Concern   Not on file  Social  History Narrative   Right handed    Social Determinants of Radio broadcast assistant  Strain: Not on file  Food Insecurity: Not on file  Transportation Needs: Not on file  Physical Activity: Not on file  Stress: Not on file  Social Connections: Not on file    ECOG Status: 1 - Symptomatic but completely ambulatory  Review of Systems  Review of Systems: A 12 point ROS discussed and pertinent positives are indicated in the HPI above.  All other systems are negative.  Physical Exam No direct physical exam was performed (except for noted visual exam findings with Video Visits).   Vital Signs: There were no vitals taken for this visit.  Imaging:  The following examinations were reviewed in detail: CT of the chest, abdomen and pelvis - 2/03/16/2021; 10/11/2020  Interval decrease in size of known multifocal metastatic disease with index lesion in the subcapsular aspect of the right lobe of the liver now measuring 1.8 cm in diameter (image 17, 76, previously, 2.6 cm and dominant eccentric Calcified lesion within the lateral segment of the left lobe of the liver now measuring 2.9 cm (16, series 6), previously, 3.3 cm.  No new or progressive hepatic lesions.  Unfortunately, there has been progression of pulmonary metastatic disease with interval development of an approximately 4 mm nodule within the medial aspect of the right upper lobe as well as interval increase in size of previously noted punctate pulmonary nodules (majority of pulmonary nodules previously measured 1 to 2 mm, currently, 4 mm, with index nodule within the left costophrenic angle seen on image 102, series 15).  Additionally, there is concern for local recurrence at the distal colonic anastomosis with the left hemipelvis with perianastomotic soft tissue now measuring 3.5 x 2.5 cm, previously, 2.1 x 1.4 cm.    CT ABDOMEN PELVIS W WO CONTRAST  Result Date: 03/16/2021 CLINICAL DATA:  A 46 year old female presents post  radioembolization in the setting of colon cancer metastasis to the liver. EXAM: CT ABDOMEN WITHOUT AND CHEST, ABDOMEN, AND PELVIS WITH CONTRAST TECHNIQUE: Multidetector CT imaging of the abdomen was performed without contrast. Subsequently, CT imaging of the chest, abdomen and pelvis was performed following the standard protocol during bolus administration of intravenous contrast. RADIATION DOSE REDUCTION: This exam was performed according to the departmental dose-optimization program which includes automated exposure control, adjustment of the mA and/or kV according to patient size and/or use of iterative reconstruction technique. CONTRAST:  113m OMNIPAQUE IOHEXOL 300 MG/ML  SOLN COMPARISON:  September of 2022. FINDINGS: CT CHEST FINDINGS Cardiovascular: LEFT sided Port-A-Cath terminates in the upper RIGHT atrium. Heart size is normal. Aortic caliber is normal with scattered atherosclerotic changes. Central pulmonary vasculature is unremarkable. Mediastinum/Nodes: No signs of mediastinal or hilar lymphadenopathy. No axillary or supraclavicular lymphadenopathy. Esophagus with suspected small varices in the distal esophagus. Lungs/Pleura: Scattered small pulmonary nodules which are new compared to previous imaging. For instance, (image 99/15) new RIGHT lower lobe pulmonary nodule at 5 mm. (Image 43/15) 3 mm RIGHT upper lobe pulmonary nodule at least 6 additional pulmonary nodules in the RIGHT chest randomly distributed. (Image 114/15) 5 mm LEFT lower lobe pulmonary nodule is the largest of visible pulmonary nodules. At least 7 additional pulmonary nodules in the LEFT lung also randomly distributed. The airways are patent. There is no consolidation or sign of pleural effusion. Musculoskeletal: See below for full musculoskeletal details. CT ABDOMEN PELVIS FINDINGS Hepatobiliary: Hepatic steatosis is largely similar to the prior exam. LEFT hepatic lobe with increased attenuation over the RIGHT hepatic lobe likely  related to interval radioembolization. This is diffuse throughout medial and  lateral segment. Dominant LEFT lobe lesion in the central lateral segment of the LEFT hepatic lobe hepatic subsegment II (image 73/11) 24 mm previously 33 mm greatest dimension. Signs of central dense material in the other lesion seen anteriorly in the LEFT hepatic lobe at the same level related to prior embolization now measuring 21 mm as compared to 30 mm greatest axial dimension. Periportal lesion (image 79/11) 21 mm as compared to 28 mm. Lower density is seen within this lesion and another small lesion along the tip of the LEFT lateral segment (image 79/11) this smaller lesion measuring 14 mm estimated at up to 20 mm previously. RIGHT posterior hepatic lobe lesion also diminished in size (image 72/11) 15 as compared to 17 mm. No new suspicious hepatic lesions but with increasing biliary duct distension in the setting of periportal metastasis. Biliary duct distension affects the LEFT hepatic lobe preferentially and remains mild but is increased since previous imaging. Gallbladder is collapsed with signs of cholelithiasis. No biliary duct distension to the RIGHT hepatic lobe. Pancreas: Unremarkable. Coil embolization along the course of the gastroduodenal artery. Spleen: Splenomegaly 16 cm greatest craniocaudal dimension as compared to 14 cm. Adrenals/Urinary Tract: Adrenal glands are normal. Symmetric renal enhancement. No hydronephrosis. No perivesical stranding. No suspicious renal lesion. Stomach/Bowel: Question of mild gastric thickening though this is not substantially changed compared to previous imaging. Small bowel loops are nondilated. Signs of ileocecal resection with anastomosis in the RIGHT hemiabdomen. Signs of anastomosis in the distal transverse colon. Signs of anastomosis of the distal sigmoid colon. Colonic anastomosis in the pelvis is associated with increasing soft tissue about the anastomotic site, just above the  anastomotic level. This measures approximately 3.5 x 2.5 cm as compared to 1.4 x 2.1 cm. Fascial thickening along the presacral space and LEFT pelvic sidewall is otherwise unchanged. Signs of thickening of the ascending colon which is diffuse. Fascial thickening along the anterior RIGHT retroperitoneum and anterior LEFT retroperitoneum with increasing conspicuity over the short interval since September. No gross peritoneal nodularity. Trace fluid overlies the surface of the spleen which is a new finding. No pneumatosis. Vascular/Lymphatic: Signs of portal hypertension with engorgement of the portal vein and the splenic vein. Small varices suspected in the esophagus arising from the LEFT gastric/coronary vein. There is no gastrohepatic or hepatoduodenal ligament lymphadenopathy. No retroperitoneal or mesenteric lymphadenopathy. Reproductive: Soft tissue adjacent to the colonic anastomosis is contiguous with the LEFT vaginal apex. Other: Trace ascites in some thickening along fascial planes and potential serosal thickening versus edema of the RIGHT hemicolon as described. Musculoskeletal: No acute bone finding. No destructive bone process. Spinal degenerative changes. IMPRESSION: 1. Multiple new small pulmonary nodules, most suspicious for metastatic disease. 2. Response to therapy in the liver. Decreasing size of liver lesions as discussed with signs of interval radioembolization. 3. Findings suspicious for perianastomotic recurrence in the pelvis with adjacent post treatment changes that otherwise show little change. 4. New small volume ascites and some RIGHT colonic thickening as well as retroperitoneal thickening could be related to worsening portal hypertension given increased splenic enlargement. Correlate with any signs of portal colopathy. 5. Given fascial thickening in the retroperitoneum and findings in the pelvis would also suggest attention on follow-up for any signs of peritoneal/retroperitoneal extension  of disease. No current signs of peritoneal or frank retroperitoneal nodularity. 6. Signs of gastric thickening likely related to chronic gastritis and or portal gastropathy. Aortic Atherosclerosis (ICD10-I70.0). Electronically Signed   By: Zetta Bills M.D.   On: 03/16/2021 17:45  CT CHEST W CONTRAST  Result Date: 03/16/2021 CLINICAL DATA:  A 46 year old female presents post radioembolization in the setting of colon cancer metastasis to the liver. EXAM: CT ABDOMEN WITHOUT AND CHEST, ABDOMEN, AND PELVIS WITH CONTRAST TECHNIQUE: Multidetector CT imaging of the abdomen was performed without contrast. Subsequently, CT imaging of the chest, abdomen and pelvis was performed following the standard protocol during bolus administration of intravenous contrast. RADIATION DOSE REDUCTION: This exam was performed according to the departmental dose-optimization program which includes automated exposure control, adjustment of the mA and/or kV according to patient size and/or use of iterative reconstruction technique. CONTRAST:  161m OMNIPAQUE IOHEXOL 300 MG/ML  SOLN COMPARISON:  September of 2022. FINDINGS: CT CHEST FINDINGS Cardiovascular: LEFT sided Port-A-Cath terminates in the upper RIGHT atrium. Heart size is normal. Aortic caliber is normal with scattered atherosclerotic changes. Central pulmonary vasculature is unremarkable. Mediastinum/Nodes: No signs of mediastinal or hilar lymphadenopathy. No axillary or supraclavicular lymphadenopathy. Esophagus with suspected small varices in the distal esophagus. Lungs/Pleura: Scattered small pulmonary nodules which are new compared to previous imaging. For instance, (image 99/15) new RIGHT lower lobe pulmonary nodule at 5 mm. (Image 43/15) 3 mm RIGHT upper lobe pulmonary nodule at least 6 additional pulmonary nodules in the RIGHT chest randomly distributed. (Image 114/15) 5 mm LEFT lower lobe pulmonary nodule is the largest of visible pulmonary nodules. At least 7 additional  pulmonary nodules in the LEFT lung also randomly distributed. The airways are patent. There is no consolidation or sign of pleural effusion. Musculoskeletal: See below for full musculoskeletal details. CT ABDOMEN PELVIS FINDINGS Hepatobiliary: Hepatic steatosis is largely similar to the prior exam. LEFT hepatic lobe with increased attenuation over the RIGHT hepatic lobe likely related to interval radioembolization. This is diffuse throughout medial and lateral segment. Dominant LEFT lobe lesion in the central lateral segment of the LEFT hepatic lobe hepatic subsegment II (image 73/11) 24 mm previously 33 mm greatest dimension. Signs of central dense material in the other lesion seen anteriorly in the LEFT hepatic lobe at the same level related to prior embolization now measuring 21 mm as compared to 30 mm greatest axial dimension. Periportal lesion (image 79/11) 21 mm as compared to 28 mm. Lower density is seen within this lesion and another small lesion along the tip of the LEFT lateral segment (image 79/11) this smaller lesion measuring 14 mm estimated at up to 20 mm previously. RIGHT posterior hepatic lobe lesion also diminished in size (image 72/11) 15 as compared to 17 mm. No new suspicious hepatic lesions but with increasing biliary duct distension in the setting of periportal metastasis. Biliary duct distension affects the LEFT hepatic lobe preferentially and remains mild but is increased since previous imaging. Gallbladder is collapsed with signs of cholelithiasis. No biliary duct distension to the RIGHT hepatic lobe. Pancreas: Unremarkable. Coil embolization along the course of the gastroduodenal artery. Spleen: Splenomegaly 16 cm greatest craniocaudal dimension as compared to 14 cm. Adrenals/Urinary Tract: Adrenal glands are normal. Symmetric renal enhancement. No hydronephrosis. No perivesical stranding. No suspicious renal lesion. Stomach/Bowel: Question of mild gastric thickening though this is not  substantially changed compared to previous imaging. Small bowel loops are nondilated. Signs of ileocecal resection with anastomosis in the RIGHT hemiabdomen. Signs of anastomosis in the distal transverse colon. Signs of anastomosis of the distal sigmoid colon. Colonic anastomosis in the pelvis is associated with increasing soft tissue about the anastomotic site, just above the anastomotic level. This measures approximately 3.5 x 2.5 cm as compared to 1.4  x 2.1 cm. Fascial thickening along the presacral space and LEFT pelvic sidewall is otherwise unchanged. Signs of thickening of the ascending colon which is diffuse. Fascial thickening along the anterior RIGHT retroperitoneum and anterior LEFT retroperitoneum with increasing conspicuity over the short interval since September. No gross peritoneal nodularity. Trace fluid overlies the surface of the spleen which is a new finding. No pneumatosis. Vascular/Lymphatic: Signs of portal hypertension with engorgement of the portal vein and the splenic vein. Small varices suspected in the esophagus arising from the LEFT gastric/coronary vein. There is no gastrohepatic or hepatoduodenal ligament lymphadenopathy. No retroperitoneal or mesenteric lymphadenopathy. Reproductive: Soft tissue adjacent to the colonic anastomosis is contiguous with the LEFT vaginal apex. Other: Trace ascites in some thickening along fascial planes and potential serosal thickening versus edema of the RIGHT hemicolon as described. Musculoskeletal: No acute bone finding. No destructive bone process. Spinal degenerative changes. IMPRESSION: 1. Multiple new small pulmonary nodules, most suspicious for metastatic disease. 2. Response to therapy in the liver. Decreasing size of liver lesions as discussed with signs of interval radioembolization. 3. Findings suspicious for perianastomotic recurrence in the pelvis with adjacent post treatment changes that otherwise show little change. 4. New small volume  ascites and some RIGHT colonic thickening as well as retroperitoneal thickening could be related to worsening portal hypertension given increased splenic enlargement. Correlate with any signs of portal colopathy. 5. Given fascial thickening in the retroperitoneum and findings in the pelvis would also suggest attention on follow-up for any signs of peritoneal/retroperitoneal extension of disease. No current signs of peritoneal or frank retroperitoneal nodularity. 6. Signs of gastric thickening likely related to chronic gastritis and or portal gastropathy. Aortic Atherosclerosis (ICD10-I70.0). Electronically Signed   By: Zetta Bills M.D.   On: 03/16/2021 17:45    Labs:  CBC: Recent Labs    12/08/20 0750 12/22/20 0848 01/08/21 1453 01/19/21 0931  WBC 3.0* 2.8* 2.6* 1.9*  HGB 11.9* 11.4* 12.3 11.2*  HCT 38.2 36.5 37.9 36.4  PLT 133* 130* 109* 105*    COAGS: Recent Labs    11/03/20 0810 11/17/20 0805 12/08/20 0750  INR 1.1 1.1 1.0    BMP: Recent Labs    12/08/20 0750 12/22/20 0848 01/03/21 0839 01/08/21 1453 03/16/21 0900  NA 135 137 136 137 137  K 3.7 3.6 4.0 4.3 3.4*  CL 105 101 101 101 102  CO2 _0 GLUCOSE 244* 280* 258* 252* 297*  BUN _1 CALCIUM 8.8* 10.1 10.0 10.0 9.4  CREATININE 0.62 0.56 0.47 0.63 0.51  GFRNONAA >60 >60 >60  --  >60    LIVER FUNCTION TESTS: Recent Labs    12/22/20 0848 01/03/21 0839 01/08/21 1453 03/16/21 0900  BILITOT 0.9 1.3* 0.6 0.8  AST 31 32 35 24  ALT _2 ALKPHOS 128* 128* 153* 147*  PROT 7.0 7.1 6.6 7.2  ALBUMIN 3.9 4.0 4.0 3.8    TUMOR MARKERS: Recent Labs    09/12/20 0910 10/17/20 1331 01/19/21 0931 02/16/21 0953  CEA 33.63* 117.27* 61.14* 53.03*    Assessment and Plan:  [name] is a 46 y.o. 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  Patient is seen today in telemedicine consultation following acquisition of postprocedural CT scan of the chest, abdomen pelvis performed 03/16/2021  The following examinations  were reviewed in detail: CT of the chest, abdomen and pelvis - 2/03/16/2021; 10/11/2020  Interval decrease in size of known multifocal metastatic disease with index lesion in the subcapsular aspect of the right lobe of the liver now measuring 1.8 cm in diameter (image 17, 76, previously, 2.6 cm and dominant eccentric Calcified lesion within the lateral segment of the left lobe of the liver now measuring 2.9 cm (16, series 6), previously, 3.3 cm.  No new or progressive hepatic lesions.  These findings are associated with continued reduction in patient's CEA levels, measuring 53 on 02/16/2021, previously 117 on preprocedural laboratories obtained 10/17/2020.  Unfortunately, there has been progression of pulmonary metastatic disease with interval development of an approximately 4 mm nodule within the medial aspect of the right upper lobe as well as interval increase in size of previously noted punctate pulmonary nodules (majority of pulmonary nodules previously measured 1 to 2 mm, currently, the majority measuring 3 - 4 mm, with index nodule within the left costophrenic angle seen on image 102, series 15).  Additionally, there is concern for local recurrence at the distal colonic anastomosis with the left hemipelvis with perianastomotic soft tissue now measuring 3.5 x 2.5 cm, previously, 2.1 x 1.4 cm, the measurements are difficult secondary to the ill-defined borders of the soft tissue in the left hemipelvis.  Given above, the patient is presently NOT a candidate for additional liver directed therapy at this time though may benefit from initiation of systemic treatment at the discretion of Dr. Benay Spice.  While disappointed with results of extrahepatic progression of disease,  the patient demonstrated excellent understanding of the above conversation and will follow up with the oncology team.  Plan: - Return to oncology for consideration of reinitiation of systemic therapy.  A copy of this report was sent to the requesting provider on this date.  Electronically Signed: Sandi Mariscal 03/17/2021, 7:50 AM   I spent a total of 15 Minutes in remote  clinical consultation, greater than 50% of which was counseling/coordinating care for metastatic colon cancer, post Y90 radioembolization.    Visit type: Audio only (telephone). Audio (no video) only due to patient's lack of internet/smartphone capability. Alternative for in-person consultation at [facility], [address], [place], [place]. This visit type was conducted due to national recommendations for restrictions regarding the COVID-19 Pandemic (e.g. social distancing).  This format is felt to be most appropriate for this patient at this time.  All issues noted in this document were discussed and addressed.

## 2021-03-18 LAB — HEMOGLOBIN A1C
Est. average glucose Bld gHb Est-mCnc: 235 mg/dL
Hgb A1c MFr Bld: 9.8 % — ABNORMAL HIGH (ref 4.8–5.6)

## 2021-03-18 LAB — LIPID PANEL
Chol/HDL Ratio: 4.2 ratio (ref 0.0–4.4)
Cholesterol, Total: 152 mg/dL (ref 100–199)
HDL: 36 mg/dL — ABNORMAL LOW (ref >39)
LDL Chol Calc (NIH): 98 mg/dL (ref 0–99)
Triglycerides: 97 mg/dL (ref 0–149)
VLDL Cholesterol Cal: 18 mg/dL (ref 5–40)

## 2021-03-27 ENCOUNTER — Encounter (HOSPITAL_BASED_OUTPATIENT_CLINIC_OR_DEPARTMENT_OTHER): Payer: Self-pay

## 2021-03-27 ENCOUNTER — Ambulatory Visit
Admission: EM | Admit: 2021-03-27 | Discharge: 2021-03-27 | Disposition: A | Discharge status: Home / Self Care | Payer: BC Managed Care – PPO | Attending: Family Medicine | Admitting: Family Medicine

## 2021-03-27 ENCOUNTER — Other Ambulatory Visit: Payer: Self-pay

## 2021-03-27 ENCOUNTER — Emergency Department (HOSPITAL_BASED_OUTPATIENT_CLINIC_OR_DEPARTMENT_OTHER)
Admission: EM | Admit: 2021-03-27 | Discharge: 2021-03-27 | Disposition: A | Discharge status: Home / Self Care | Payer: BC Managed Care – PPO | Attending: Emergency Medicine | Admitting: Emergency Medicine

## 2021-03-27 DIAGNOSIS — R1032 Left lower quadrant pain: Secondary | ICD-10-CM

## 2021-03-27 DIAGNOSIS — Z85038 Personal history of other malignant neoplasm of large intestine: Secondary | ICD-10-CM

## 2021-03-27 DIAGNOSIS — C7800 Secondary malignant neoplasm of unspecified lung: Secondary | ICD-10-CM | POA: Insufficient documentation

## 2021-03-27 DIAGNOSIS — C189 Malignant neoplasm of colon, unspecified: Secondary | ICD-10-CM | POA: Diagnosis not present

## 2021-03-27 DIAGNOSIS — Z7984 Long term (current) use of oral hypoglycemic drugs: Secondary | ICD-10-CM | POA: Diagnosis not present

## 2021-03-27 LAB — COMPREHENSIVE METABOLIC PANEL
ALT: 19 U/L (ref 0–44)
AST: 27 U/L (ref 15–41)
Albumin: 3.8 g/dL (ref 3.5–5.0)
Alkaline Phosphatase: 130 U/L — ABNORMAL HIGH (ref 38–126)
Anion gap: 10 (ref 5–15)
BUN: 9 mg/dL (ref 6–20)
CO2: 25 mmol/L (ref 22–32)
Calcium: 9.3 mg/dL (ref 8.9–10.3)
Chloride: 101 mmol/L (ref 98–111)
Creatinine, Ser: 0.45 mg/dL (ref 0.44–1.00)
GFR, Estimated: >60 mL/min (ref >60)
Glucose, Bld: 346 mg/dL — ABNORMAL HIGH (ref 70–99)
Potassium: 3.8 mmol/L (ref 3.5–5.1)
Sodium: 136 mmol/L (ref 135–145)
Total Bilirubin: 1.1 mg/dL (ref 0.3–1.2)
Total Protein: 7 g/dL (ref 6.5–8.1)

## 2021-03-27 LAB — CBC
HCT: 34.5 % — ABNORMAL LOW (ref 36.0–46.0)
Hemoglobin: 10.5 g/dL — ABNORMAL LOW (ref 12.0–15.0)
MCH: 22.9 pg — ABNORMAL LOW (ref 26.0–34.0)
MCHC: 30.4 g/dL (ref 30.0–36.0)
MCV: 75.3 fL — ABNORMAL LOW (ref 80.0–100.0)
Platelets: 111 10*3/uL — ABNORMAL LOW (ref 150–400)
RBC: 4.58 MIL/uL (ref 3.87–5.11)
RDW: 14.6 % (ref 11.5–15.5)
WBC: 2.5 10*3/uL — ABNORMAL LOW (ref 4.0–10.5)
nRBC: 0 % (ref 0.0–0.2)

## 2021-03-27 LAB — POCT URINALYSIS DIP (MANUAL ENTRY)
Bilirubin, UA: NEGATIVE
Glucose, UA: >=1000 mg/dL — AB
Leukocytes, UA: NEGATIVE
Nitrite, UA: NEGATIVE
Protein Ur, POC: NEGATIVE mg/dL
Spec Grav, UA: 1.015 (ref 1.010–1.025)
Urobilinogen, UA: 0.2 E.U./dL
pH, UA: 5.5 (ref 5.0–8.0)

## 2021-03-27 LAB — URINALYSIS, ROUTINE W REFLEX MICROSCOPIC
Bilirubin Urine: NEGATIVE
Glucose, UA: >1000 mg/dL — AB
Hgb urine dipstick: NEGATIVE
Ketones, ur: NEGATIVE mg/dL
Nitrite: NEGATIVE
Protein, ur: NEGATIVE mg/dL
Specific Gravity, Urine: 1.042 — ABNORMAL HIGH (ref 1.005–1.030)
pH: 6 (ref 5.0–8.0)

## 2021-03-27 LAB — CBG MONITORING, ED: Glucose-Capillary: 306 mg/dL — ABNORMAL HIGH (ref 70–99)

## 2021-03-27 LAB — LIPASE, BLOOD: Lipase: 49 U/L (ref 11–51)

## 2021-03-27 MED ORDER — HYDROCODONE-ACETAMINOPHEN 5-325 MG PO TABS: 1.0000 | ORAL_TABLET | Freq: Three times a day (TID) | ORAL | 0 refills | Status: DC | PRN

## 2021-03-27 MED ORDER — HEPARIN SOD (PORK) LOCK FLUSH 100 UNIT/ML IV SOLN
500.0000 [IU] | Freq: Once | INTRAVENOUS | Status: AC
  Administered 2021-03-27: 500 [IU]
  Filled 2021-03-27: qty 5

## 2021-03-27 MED ORDER — DOCUSATE SODIUM 100 MG PO CAPS: 100.0000 mg | ORAL_CAPSULE | Freq: Two times a day (BID) | ORAL | 0 refills | Status: AC

## 2021-03-27 NOTE — ED Triage Notes (Signed)
Patient states that she has had lower left abdominal pain for about a week  Patient states that the pain is coming and going  Patient states that she had low grade fever last week  Patient states she tried Tylenol for pain without any relief

## 2021-03-27 NOTE — Discharge Instructions (Addendum)
Please see Dr. Benay Spice as planned on Thursday. Pain control focus for now.

## 2021-03-27 NOTE — Discharge Instructions (Signed)
Go to the emergency department for further evaluation

## 2021-03-27 NOTE — ED Provider Notes (Signed)
[facility] EMERGENCY DEPT Provider Note   CSN: [id number] Arrival date & time: 03/27/21  1541     History  Chief Complaint  Patient presents with   Abdominal Pain    [name] is a 46 y.o. female.  HPI     46 year old female comes in with chief complaint of left-sided abdominal pain.  She has history of colon cancer status post resection, chemo and radiation following up with interventional radiology and Dr. Benay Spice with oncology.  Over the past week she has been having intermittent episodes of left-sided abdominal pain.  Pain is left, mid abdominal level and left lower quadrant.  It is nonradiating.  The episodes are intermittent and last for an hour.  Pain is described as sharp and stabbing.  She denies any urinary symptoms associated with it, specifically no blood in the urine.  No history of similar pain.  Seen in urgent care, advised to come to the ER for further evaluation and possible kidney stone rule out.  Patient's BMs have been normal.  She had a CT scan done recently.  Home Medications Prior to Admission medications   Medication Sig Start Date End Date Taking? Authorizing Provider  docusate sodium (COLACE) 100 MG capsule Take 1 capsule (100 mg total) by mouth every 12 (twelve) hours. 03/27/21  Yes Varney Biles, MD  HYDROcodone-acetaminophen (NORCO/VICODIN) 5-325 MG tablet Take 1 tablet by mouth every 8 (eight) hours as needed for up to 3 days for severe pain. 03/27/21 03/30/21 Yes Varney Biles, MD  Accu-Chek Softclix Lancets lancets SMARTSIG:2 Topical Twice Daily 07/22/20   [provider]  acetaminophen (TYLENOL) 500 MG tablet Take 1,000 mg by mouth every 6 (six) hours as needed.    [provider]  bacitracin ointment Apply 1 application topically 2 (two) times daily. Patient not taking: Reported on 03/16/2021 02/10/21   Jaynee Eagles, PA-C  blood glucose meter kit and supplies Dispense based on patient and insurance preference.  Use up to four times daily as directed. (FOR ICD-10 E10.9, E11.9). 07/22/20   [name], Raymond J, MD  CINNAMON PO Take 2,000 mg by mouth in the morning and at bedtime.    [provider]  diphenhydrAMINE (BENADRYL) 50 MG tablet Take 100 mg by mouth at bedtime as needed for itching or sleep.    [provider]  diphenoxylate-atropine (LOMOTIL) 2.5-0.025 MG tablet TAKE 1- 2 TABLETS BY MOUTH 4 TIMES DAILY AS NEEDED FOR DIARRHEA /  LOOSE  STOOLS 08/23/20   [name], MD  doxycycline (VIBRAMYCIN) 100 MG capsule Take 1 capsule (100 mg total) by mouth 2 (two) times daily. 02/10/21   Jaynee Eagles, PA-C  Famotidine (PEPCID PO) Take 1 tablet by mouth as needed.    [provider]  ferrous sulfate 325 (65 FE) MG tablet Take 325 mg by mouth daily with breakfast.    [provider]  hydrOXYzine (ATARAX/VISTARIL) 25 MG tablet Take 1 tablet (25 mg total) by mouth every 6 (six) hours. 12/03/20   Mickie Hillier, PA-C  lacosamide (VIMPAT) 200 MG TABS tablet Take 1 tablet by mouth twice daily 02/17/21   [name], DO  lidocaine-prilocaine (EMLA) cream Apply 1 application topically as directed. Apply to port site 1 hour prior to stick and cover with plastic wrap 10/22/19   [name], MD  loperamide (IMODIUM) 2 MG capsule Take 2 mg by mouth as needed for diarrhea or loose stools.    [provider]  LORazepam (ATIVAN)  0.5 MG tablet Take 1 tablet (0.5 mg total) by mouth 2 (two) times daily as needed. for anxiety 03/09/21   [name], MD  magnesium oxide (MAG-OX) 400 (240 Mg) MG tablet Take 1 tablet (400 mg total) by mouth 2 (two) times daily. 03/09/21   [name], MD  metFORMIN (GLUCOPHAGE) 500 MG tablet Take 1 tablet (500 mg total) by mouth 2 (two) times daily with a meal. 03/16/21   [name], [name], MD  naproxen (NAPROSYN) 500 MG tablet Take 1 tablet (500 mg total) by mouth 2 (two) times daily with a meal. Patient not taking: Reported on 03/16/2021 02/10/21    Jaynee Eagles, PA-C  omeprazole (PRILOSEC) 40 MG capsule Take 40 mg by mouth as needed.    [provider]  Polyethylene Glycol 3350 (MIRALAX PO) Take 17 g by mouth daily as needed.    [provider]  prochlorperazine (COMPAZINE) 10 MG tablet Take 1 tablet (10 mg total) by mouth every 6 (six) hours as needed for nausea or vomiting. 12/20/20   Owens Shark, NP  vitamin B-12 (CYANOCOBALAMIN) 1000 MCG tablet Take 1,000 mcg by mouth daily.    [provider]      Allergies    Adhesive [tape], Clindamycin/lincomycin, Promethazine, Eggs or egg-derived products, Metoclopramide hcl, Oxycodone-acetaminophen, Sulfa antibiotics, Fluogen [influenza virus vaccine], Levaquin [levofloxacin], and Penicillins    Review of Systems   Review of Systems  All other systems reviewed and are negative.  Physical Exam Updated Vital Signs BP 131/77    Pulse 98    Temp 98.2 F (36.8 C)    Resp 16    Ht _0  (1.676 m)    Wt 70.3 kg    SpO2 99%    BMI 25.02 kg/m  Physical Exam Vitals and nursing note reviewed.  Constitutional:      Appearance: She is well-developed.  HENT:     Head: Atraumatic.  Cardiovascular:     Rate and Rhythm: Normal rate.  Pulmonary:     Effort: Pulmonary effort is normal.  Abdominal:     Tenderness: There is abdominal tenderness in the periumbilical area and left lower quadrant. There is no left CVA tenderness.  Musculoskeletal:     Cervical back: Normal range of motion and neck supple.  Skin:    General: Skin is warm and dry.  Neurological:     Mental Status: She is alert and oriented to person, place, and time.    ED Results / Procedures / Treatments   Labs (all labs ordered are listed, but only abnormal results are displayed) Labs Reviewed  COMPREHENSIVE METABOLIC PANEL - Abnormal; Notable for the following components:      Result Value   Glucose, Bld 346 (*)    Alkaline Phosphatase 130 (*)    All other components within normal limits   URINALYSIS, ROUTINE W REFLEX MICROSCOPIC - Abnormal; Notable for the following components:   Specific Gravity, Urine 1.042 (*)    Glucose, UA >1,000 (*)    Leukocytes,Ua SMALL (*)    All other components within normal limits  CBG MONITORING, ED - Abnormal; Notable for the following components:   Glucose-Capillary 306 (*)    All other components within normal limits  LIPASE, BLOOD  CBC    EKG None  Radiology No results found.  Procedures Procedures    Medications Ordered in ED Medications - No data to display  ED Course/ Medical Decision Making/ A&P  Medical Decision Making Amount and/or Complexity of Data Reviewed Labs: ordered.  Risk OTC drugs. Prescription drug management.    This patient presents to the ED with chief complaint(s) of intermittent abdominal pain that has been present over the week. With pertinent past medical history of metastatic colon CA status post resection which further complicates the presenting complaint. The complaint involves an extensive differential diagnosis and treatment options and also carries with it a high risk of complications and morbidity.    The differential diagnosis includes : Cancer related pain, diverticulitis, intra-abdominal abscess, adhesions related pain, cystitis.  Patient had a CT scan done on 2-9.  I reviewed the CT scan findings.  Part of the CT results are as following: Signs of anastomosis of the distal sigmoid colon. Colonic anastomosis in the pelvis is associated with increasing soft tissue about the anastomotic site, just above the anastomotic level. This measures approximately 3.5 x 2.5 cm as compared to 1.4 x 2.1 cm. Fascial thickening along the presacral space and LEFT pelvic sidewall is otherwise unchanged.   Signs of thickening of the ascending colon which is diffuse. Fascial thickening along the anterior RIGHT retroperitoneum and anterior LEFT retroperitoneum with increasing  conspicuity over the short interval since September.   My initial gestalt is that patient has pain related to cancer.  Clinically, it does not appear that she has stone pathology or diverticulitis.  Prior CT scan did not reveal any significant diverticulosis.  There is mentioning of some thickening of the ascending colon, but that too does not appear consistent with infectious pathology.  She is having normal BM, no diarrhea and does not appear to be having obstruction or ileus.  I considered repeat CT scan, but do not think it is important at this time.  Additional history obtained: Previous notes from oncology, IR and review of recent CT scan.  - Consulted or discussed management/test interpretation w/ external professional: Dr. Johnanna Schneiders -who is patient's primary oncologist.  We reviewed patient's presenting symptoms along with the recent CT scan.  He reviewed the CT scan with me and compared it with prior CT scan.  He to thinks that the pain most likely is cancer related or could be because of adhesions.  Patient has a follow-up appointment coming up on Thursday, he is off the favor that patient can be safely discharged with return precautions, and pain control.  They will see the patient on Thursday and formulate further plan if needed.  This recommendation discussed with the patient.  She is quite comfortable with the plan.  She to does not think that she is having kidney stones  Strict ER return precautions have been discussed, and patient is agreeing with the plan and is comfortable with the workup done and the recommendations from the ER.     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

## 2021-03-27 NOTE — ED Provider Notes (Signed)
[facility]    CSN: [id number] Arrival date & time: 03/27/21  1231      History   Chief Complaint Chief Complaint  Patient presents with   Abdominal Pain    Lower abdominal pain    HPI [name] is a 46 y.o. female.   Presenting today with about a week of severe left lower quadrant/pelvic pain.  She states it comes and goes, no obvious triggers or patterns with the pain.  She has not noticed any alleviating or aggravating factors thus far.  Had some nausea initially but now no nausea, vomiting, diarrhea, constipation, fever, chills, dysuria, urinary frequency or hesitancy, vaginal discharge or irritation.  Has been trying Tylenol for pain with minimal relief.  States the pain is a 12 out of 10 when it is present, currently not present.  Complicated medical history to include metastatic colon cancer status post partial colectomy, colostomy reversal, chemotherapy, status post total hysterectomy and bilateral oophorectomy, status post appendectomy, IBS, uncontrolled diabetes mellitus.   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   Ileus ([place])    Metastatic malignant neoplasm ([place])    Genetic testing 11/13/2015   Sinusitis 01/29/2015   Pelvic mass in female 12/28/2014   Ovarian mass, right    Hypokalemia 02/03/2014   Nausea 12/20/2013   Fever 12/18/2013   Dehydration 12/18/2013   Diarrhea 10/28/2013   Malfunction of device 10/28/2013   Weight loss 10/28/2013   Mucositis (ulcerative) due to antineoplastic therapy 10/28/2013   Anemia due to antineoplastic chemotherapy 09/25/2013   Chemotherapy induced thrombocytopenia 09/25/2013   Hypomagnesemia 09/25/2013   Hypophosphatemia 09/25/2013   Adverse drug effect 09/25/2013   Abdominal pain 09/24/2013   Cancer of ascending colon s/p right colectomy 08/11/2013 08/05/2013   Anemia, iron deficiency 01/28/2013   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[place]) 11/11/2012   Discoloration of skin of face 09/03/2012   Medication reaction 05/03/2012   Migraine 03/31/2012   Hyperglycemia 01/18/2012   Fatigue 12/12/2011   ADD (attention deficit disorder) 09/23/2010   Right wrist pain 05/01/2010   LIBIDO, DECREASED 04/07/2010   IRON DEFICIENCY 05/17/2009   ANA POSITIVE 05/17/2009   OVARIAN CYST 05/10/2009   FATTY LIVER DISEASE 03/24/2009   TRANSAMINASES, SERUM, ELEVATED 03/21/2009   Gastroparesis 08/16/2008   DEPRESSION/ANXIETY 09/19/2006   HYPERLIPIDEMIA 08/16/2006   RESTLESS LEG SYNDROME 08/16/2006   GERD 08/16/2006   IBS 08/16/2006    Past Surgical History:  Procedure Laterality Date   ABDOMINAL HYSTERECTOMY  08/24/2004  partial   APPENDECTOMY  08/24/2004   COLON SURGERY  08/11/2013   removed a foot of colon   COLONOSCOPY  2019   colonoscopy 10-18-14     COLONOSCOPY WITH PROPOFOL  07/07/2013   CYSTOSCOPY  11/01/2003   ENDOMETRIAL FULGURATION  11/01/2003   ESOPHAGOGASTRODUODENOSCOPY  07/07/2013   IR ANGIOGRAM SELECTIVE EACH ADDITIONAL VESSEL  11/03/2020   IR ANGIOGRAM SELECTIVE EACH ADDITIONAL VESSEL  11/03/2020   IR ANGIOGRAM SELECTIVE EACH  ADDITIONAL VESSEL  11/03/2020   IR ANGIOGRAM SELECTIVE EACH ADDITIONAL VESSEL  11/03/2020   IR ANGIOGRAM SELECTIVE EACH ADDITIONAL VESSEL  11/17/2020   IR ANGIOGRAM SELECTIVE EACH ADDITIONAL VESSEL  11/17/2020   IR ANGIOGRAM SELECTIVE EACH ADDITIONAL VESSEL  11/17/2020   IR ANGIOGRAM SELECTIVE EACH ADDITIONAL VESSEL  12/08/2020   IR ANGIOGRAM SELECTIVE EACH ADDITIONAL VESSEL  12/08/2020   IR ANGIOGRAM VISCERAL SELECTIVE  11/03/2020   IR ANGIOGRAM VISCERAL SELECTIVE  11/03/2020   IR ANGIOGRAM VISCERAL SELECTIVE  11/17/2020   IR ANGIOGRAM VISCERAL SELECTIVE  12/08/2020   IR EMBO ARTERIAL NOT HEMORR HEMANG INC GUIDE ROADMAPPING  11/03/2020   IR EMBO TUMOR ORGAN ISCHEMIA INFARCT INC GUIDE ROADMAPPING  11/17/2020   IR EMBO TUMOR ORGAN ISCHEMIA INFARCT INC GUIDE ROADMAPPING  12/08/2020   IR IMAGING GUIDED PORT INSERTION  03/05/2019   IR RADIOLOGIST EVAL & MGMT  10/06/2020   IR RADIOLOGIST EVAL & MGMT  01/17/2021   IR RADIOLOGIST EVAL & MGMT  03/17/2021   IR US GUIDE VASC ACCESS RIGHT  11/03/2020   IR US GUIDE VASC ACCESS RIGHT  11/17/2020   IR US GUIDE VASC ACCESS RIGHT  12/08/2020   LAPAROSCOPIC LYSIS OF ADHESIONS  11/01/2003   LAPAROSCOPIC PARTIAL COLECTOMY Right 08/11/2013   Procedure: LAPAROSCOPIC RIGHT HEMICOLECTOMY;  Surgeon: Stark Klein, MD;  Location: [facility];  Service: General;  Laterality: Right;   LAPAROTOMY N/A 12/28/2014   Procedure: EXPLORATORY LAPAROTOMY;  Surgeon: Everitt Amber, MD;  Location: [facility];  Service: Gynecology;  Laterality: N/A;   LEFT OOPHORECTOMY  08/24/2004   PANNICULECTOMY  08/24/2004   PORT-A-CATH REMOVAL Left 06/08/2014   Procedure: REMOVAL PORT-A-CATH;  Surgeon: Stark Klein, MD;  Location: [facility];  Service: General;  Laterality: Left;   PORTACATH PLACEMENT Left 09/09/2013   Procedure: INSERTION PORT-A-CATH;  Surgeon: Stark Klein, MD;  Location: [facility];  Service: General;  Laterality: Left;   SALPINGOOPHORECTOMY Right 12/28/2014   Procedure: RIGHT SALPINGO  OOPHORECTOMY;  Surgeon: Everitt Amber, MD;  Location: [facility];  Service: Gynecology;  Laterality: Right;   TUBAL LIGATION  11/07/2000   UNILATERAL SALPINGECTOMY Left 08/24/2004   WISDOM TOOTH EXTRACTION Bilateral 1994    OB History   No obstetric history on file.      Home Medications    Prior to Admission medications   Medication Sig Start Date End Date Taking? Authorizing Provider  Accu-Chek Softclix Lancets lancets SMARTSIG:2 Topical Twice Daily 07/22/20   [provider]  acetaminophen (TYLENOL) 500 MG tablet Take 1,000 mg by mouth every 6 (six) hours as needed.    [provider]  bacitracin ointment Apply 1 application topically 2 (two) times daily. Patient not taking: Reported on 03/16/2021 02/10/21   Jaynee Eagles, PA-C  blood glucose meter kit and supplies Dispense based on patient and insurance preference. Use up to four times daily as directed. (FOR ICD-10 E10.9, E11.9). 07/22/20   [name], Raymond J, MD  CINNAMON PO Take 2,000 mg by mouth in the morning and at bedtime.  [provider]  diphenhydrAMINE (BENADRYL) 50 MG tablet Take 100 mg by mouth at bedtime as needed for itching or sleep.    [provider]  diphenoxylate-atropine (LOMOTIL) 2.5-0.025 MG tablet TAKE 1- 2 TABLETS BY MOUTH 4 TIMES DAILY AS NEEDED FOR DIARRHEA /  LOOSE  STOOLS 08/23/20   [name], MD  doxycycline (VIBRAMYCIN) 100 MG capsule Take 1 capsule (100 mg total) by mouth 2 (two) times daily. 02/10/21   Jaynee Eagles, PA-C  Famotidine (PEPCID PO) Take 1 tablet by mouth as needed.    [provider]  ferrous sulfate 325 (65 FE) MG tablet Take 325 mg by mouth daily with breakfast.    [provider]  hydrOXYzine (ATARAX/VISTARIL) 25 MG tablet Take 1 tablet (25 mg total) by mouth every 6 (six) hours. 12/03/20   Mickie Hillier, PA-C  lacosamide (VIMPAT) 200 MG TABS tablet Take 1 tablet by mouth twice daily 02/17/21   [name], DO  lidocaine-prilocaine (EMLA)  cream Apply 1 application topically as directed. Apply to port site 1 hour prior to stick and cover with plastic wrap 10/22/19   [name], MD  loperamide (IMODIUM) 2 MG capsule Take 2 mg by mouth as needed for diarrhea or loose stools.    [provider]  LORazepam (ATIVAN) 0.5 MG tablet Take 1 tablet (0.5 mg total) by mouth 2 (two) times daily as needed. for anxiety 03/09/21   [name], MD  magnesium oxide (MAG-OX) 400 (240 Mg) MG tablet Take 1 tablet (400 mg total) by mouth 2 (two) times daily. 03/09/21   [name], MD  metFORMIN (GLUCOPHAGE) 500 MG tablet Take 1 tablet (500 mg total) by mouth 2 (two) times daily with a meal. 03/16/21   [name], [name], MD  naproxen (NAPROSYN) 500 MG tablet Take 1 tablet (500 mg total) by mouth 2 (two) times daily with a meal. Patient not taking: Reported on 03/16/2021 02/10/21   Jaynee Eagles, PA-C  omeprazole (PRILOSEC) 40 MG capsule Take 40 mg by mouth as needed.    [provider]  Polyethylene Glycol 3350 (MIRALAX PO) Take 17 g by mouth daily as needed.    [provider]  prochlorperazine (COMPAZINE) 10 MG tablet Take 1 tablet (10 mg total) by mouth every 6 (six) hours as needed for nausea or vomiting. 12/20/20   Owens Shark, NP  vitamin B-12 (CYANOCOBALAMIN) 1000 MCG tablet Take 1,000 mcg by mouth daily.    [provider]    Family History Family History  Problem Relation Age of Onset   Colon polyps Mother        3+ colon polyps at each colonoscopy - "several"   Colitis Mother    Heart attack Mother    Diabetes Mother    Heart disease Mother    Diabetes Father    Heart attack Father    Stroke Father    Congestive Heart Failure Father    Heart disease Father    Cirrhosis Maternal Aunt    Lung cancer Maternal Uncle        d. 1s; smoker and worked around asbestos   Other Paternal Grandfather        "bone cancer"   Colon cancer Maternal Uncle        dx early 54s   Breast cancer Maternal Aunt         dx 50s-60s; s/p lumpectomy   Dementia Maternal Aunt    Other Cousin  maternal 1st cousin w/ "lung issues"   Dementia Paternal Aunt    Dementia Paternal Uncle    Prostate cancer Paternal Uncle 54   Esophageal cancer Neg Hx    Rectal cancer Neg Hx    Stomach cancer Neg Hx     Social History Social History   Tobacco Use   Smoking status: Never   Smokeless tobacco: Never  Vaping Use   Vaping Use: Never used  Substance Use Topics   Alcohol use: No   Drug use: No     Allergies   Adhesive [tape], Clindamycin/lincomycin, Promethazine, Eggs or egg-derived products, Metoclopramide hcl, Oxycodone-acetaminophen, Sulfa antibiotics, Fluogen [influenza virus vaccine], Levaquin [levofloxacin], and Penicillins   Review of Systems Review of Systems Per HPI  Physical Exam Triage Vital Signs ED Triage Vitals  Enc Vitals Group     BP 03/27/21 1417 128/84     Pulse Rate 03/27/21 1417 95     Resp 03/27/21 1417 18     Temp 03/27/21 1417 98.9 F (37.2 C)     Temp Source 03/27/21 1417 Oral     SpO2 03/27/21 1417 99 %     Weight --      Height --      Head Circumference --      Peak Flow --      Pain Score 03/27/21 1411 8     Pain Loc --      Pain Edu? --      Excl. in [place]? --    No data found.  Updated Vital Signs BP 128/84 (BP Location: Right Arm)    Pulse 95    Temp 98.9 F (37.2 C) (Oral)    Resp 18    SpO2 99%   Visual Acuity Right Eye Distance:   Left Eye Distance:   Bilateral Distance:    Right Eye Near:   Left Eye Near:    Bilateral Near:     Physical Exam Vitals and nursing note reviewed.  Constitutional:      Appearance: Normal appearance. She is not ill-appearing.  HENT:     Head: Atraumatic.     Mouth/Throat:     Mouth: Mucous membranes are moist.  Eyes:     Extraocular Movements: Extraocular movements intact.     Conjunctiva/sclera: Conjunctivae normal.  Cardiovascular:     Rate and Rhythm: Normal rate and regular rhythm.     Heart  sounds: Normal heart sounds.  Pulmonary:     Effort: Pulmonary effort is normal.     Breath sounds: Normal breath sounds. No wheezing or rales.  Abdominal:     General: There is no distension.     Palpations: Abdomen is soft. There is no mass.     Tenderness: There is abdominal tenderness. There is guarding. There is no rebound.     Comments: Mildly hyperactive bowel sounds.  Numerous healed incisional scars to abdomen.  Left-sided abdominal tenderness to palpation worse in the lower quadrant with guarding, no obvious distention  Musculoskeletal:        General: Normal range of motion.     Cervical back: Normal range of motion and neck supple.  Skin:    General: Skin is warm and dry.  Neurological:     Mental Status: She is alert and oriented to person, place, and time.  Psychiatric:        Mood and Affect: Mood normal.        Thought Content: Thought content normal.  Judgment: Judgment normal.     UC Treatments / Results  Labs (all labs ordered are listed, but only abnormal results are displayed) Labs Reviewed  POCT URINALYSIS DIP (MANUAL ENTRY) - Abnormal; Notable for the following components:      Result Value   Glucose, UA >=1,000 (*)    Ketones, POC UA trace (5) (*)    Blood, UA trace-lysed (*)    All other components within normal limits    EKG   Radiology No results found.  Procedures Procedures (including critical care time)  Medications Ordered in UC Medications - No data to display  Initial Impression / Assessment and Plan / UC Course  I have reviewed the triage vital signs and the nursing notes.  Pertinent labs & imaging results that were available during my care of the patient were reviewed by me and considered in my medical decision making (see chart for details).     Vital signs reassuring, UA without obvious evidence of urinary tract infection but with trace lysed blood possibly related to a kidney stone.  Given the extent of her past  abdominal surgeries, metastatic disease strongly urged her to go to the emergency department or med center for immediate labs, imaging for further evaluation given the extent of her symptoms.  She is agreeable to this and will go directly to the med center via private vehicle for further evaluation of her symptoms.  She does also have appointment scheduled for later this week with PCP and oncology.  Declined EMS transport today.  Hemodynamically stable for transport.  Final Clinical Impressions(s) / UC Diagnoses   Final diagnoses:  LLQ pain  History of colon cancer     Discharge Instructions      Go to the emergency department for further evaluation    ED Prescriptions   None    PDMP not reviewed this encounter.   Volney American, [place] 03/27/21 [phone]

## 2021-03-27 NOTE — ED Triage Notes (Signed)
Left abdominal pain.  Seen in UC sent here for concerns of kidney stone.  Denies nausea or vomiting.

## 2021-03-29 ENCOUNTER — Other Ambulatory Visit: Payer: Self-pay | Admitting: Nurse Practitioner

## 2021-03-29 ENCOUNTER — Encounter: Payer: Self-pay | Admitting: Oncology

## 2021-03-29 ENCOUNTER — Telehealth: Payer: Self-pay

## 2021-03-29 DIAGNOSIS — C787 Secondary malignant neoplasm of liver and intrahepatic bile duct: Secondary | ICD-10-CM

## 2021-03-29 DIAGNOSIS — C182 Malignant neoplasm of ascending colon: Secondary | ICD-10-CM

## 2021-03-29 MED ORDER — OXYCODONE HCL 5 MG PO TABS: 5.0000 mg | ORAL_TABLET | Freq: Four times a day (QID) | ORAL | 0 refills | Status: DC | PRN

## 2021-03-29 NOTE — Telephone Encounter (Signed)
Called and spoke with the patient to let her know the provider refill her oxycodone

## 2021-03-30 ENCOUNTER — Other Ambulatory Visit: Payer: Self-pay

## 2021-03-30 ENCOUNTER — Ambulatory Visit (INDEPENDENT_AMBULATORY_CARE_PROVIDER_SITE_OTHER): Payer: BC Managed Care – PPO | Admitting: Family Medicine

## 2021-03-30 ENCOUNTER — Inpatient Hospital Stay (HOSPITAL_BASED_OUTPATIENT_CLINIC_OR_DEPARTMENT_OTHER): Payer: BC Managed Care – PPO | Admitting: Nurse Practitioner

## 2021-03-30 ENCOUNTER — Encounter (HOSPITAL_BASED_OUTPATIENT_CLINIC_OR_DEPARTMENT_OTHER): Payer: Self-pay | Admitting: Family Medicine

## 2021-03-30 ENCOUNTER — Encounter: Payer: Self-pay | Admitting: Nurse Practitioner

## 2021-03-30 VITALS — BP 122/74 | HR 92 | Ht 66.0 in | Wt 156.0 lb

## 2021-03-30 VITALS — BP 117/72 | HR 94 | Temp 97.8°F | Resp 20 | Ht 66.0 in | Wt 155.2 lb

## 2021-03-30 DIAGNOSIS — Z79899 Other long term (current) drug therapy: Secondary | ICD-10-CM | POA: Diagnosis not present

## 2021-03-30 DIAGNOSIS — D509 Iron deficiency anemia, unspecified: Secondary | ICD-10-CM | POA: Insufficient documentation

## 2021-03-30 DIAGNOSIS — C182 Malignant neoplasm of ascending colon: Secondary | ICD-10-CM

## 2021-03-30 DIAGNOSIS — Z8616 Personal history of COVID-19: Secondary | ICD-10-CM | POA: Insufficient documentation

## 2021-03-30 DIAGNOSIS — R21 Rash and other nonspecific skin eruption: Secondary | ICD-10-CM | POA: Diagnosis not present

## 2021-03-30 DIAGNOSIS — T451X5A Adverse effect of antineoplastic and immunosuppressive drugs, initial encounter: Secondary | ICD-10-CM | POA: Diagnosis not present

## 2021-03-30 DIAGNOSIS — R162 Hepatomegaly with splenomegaly, not elsewhere classified: Secondary | ICD-10-CM | POA: Insufficient documentation

## 2021-03-30 DIAGNOSIS — C787 Secondary malignant neoplasm of liver and intrahepatic bile duct: Secondary | ICD-10-CM

## 2021-03-30 DIAGNOSIS — E1165 Type 2 diabetes mellitus with hyperglycemia: Secondary | ICD-10-CM

## 2021-03-30 DIAGNOSIS — G620 Drug-induced polyneuropathy: Secondary | ICD-10-CM | POA: Insufficient documentation

## 2021-03-30 DIAGNOSIS — N1330 Unspecified hydronephrosis: Secondary | ICD-10-CM | POA: Diagnosis not present

## 2021-03-30 NOTE — Progress Notes (Signed)
° ° °  Procedures performed today:    None.  Independent interpretation of notes and tests performed by another provider:   None.  Brief History, Exam, Impression, and Recommendations:    BP 122/74    Pulse 92    Ht 5\' 6"  (1.676 m)    Wt 156 lb (70.8 kg)    SpO2 98%    BMI 25.18 kg/m   Diabetes ([place]) At last visit, we checked hemoglobin A1c which was found to be 9.8 which was an increase from prior labs several months ago We also adjusted her metformin due to some issues with GI symptoms, she reports that she feels symptoms have improved since dose adjustment Given elevated hemoglobin A1c, feel that further intervention is needed to assist in controlling her blood sugars.  Discussed options including oral medications and initiation of insulin After discussion, patient is wanting to hold off on any further pharmacotherapy or any medication adjustments at this time.  She is concerned about beginning chemotherapy cycle and starting any new medications due to potential for adverse effects Feel that her concerns are understandable.  Did state my concern regarding her elevated hemoglobin A1c and potential for short-term risks related to hyperglycemia as well as long-term risks. Patient does agree to check her blood sugar at home and maintain log, she will check fasting blood sugar as well as at least 1 reading later in the day to monitor.  She will also check her blood sugar if she is having any new acute symptoms Will need to monitor closely, consider initiation of additional medication to help with addressing blood sugar numbers If we were to proceed with insulin, would start at low-dose Given patient history, feel that targeting hemoglobin A1c less than 8% would be reasonable, particularly if undergoing chemotherapy and concern for irregular p.o. intake  Cancer of ascending colon s/p right colectomy 08/11/2013 Patient is scheduled to begin chemotherapy cycle with FOLFOX near the beginning of  March Because of his upcoming chemotherapy, she is hesitant to make any significant medication changes  Plan for close follow-up in 1 month to review blood sugars, symptoms   ___________________________________________ Emily Gasparyan [name], MD, ABFM, CAQSM [facility] [place]

## 2021-03-30 NOTE — Assessment & Plan Note (Addendum)
At last visit, we checked hemoglobin A1c which was found to be 9.8 which was an increase from prior labs several months ago We also adjusted her metformin due to some issues with GI symptoms, she reports that she feels symptoms have improved since dose adjustment Given elevated hemoglobin A1c, feel that further intervention is needed to assist in controlling her blood sugars.  Discussed options including oral medications and initiation of insulin After discussion, patient is wanting to hold off on any further pharmacotherapy or any medication adjustments at this time.  She is concerned about beginning chemotherapy cycle and starting any new medications due to potential for adverse effects Feel that her concerns are understandable.  Did state my concern regarding her elevated hemoglobin A1c and potential for short-term risks related to hyperglycemia as well as long-term risks. Patient does agree to check her blood sugar at home and maintain log, she will check fasting blood sugar as well as at least 1 reading later in the day to monitor.  She will also check her blood sugar if she is having any new acute symptoms Will need to monitor closely, consider initiation of additional medication to help with addressing blood sugar numbers If we were to proceed with insulin, would start at low-dose Given patient history, feel that targeting hemoglobin A1c less than 8% would be reasonable, particularly if undergoing chemotherapy and concern for irregular p.o. intake

## 2021-03-30 NOTE — Progress Notes (Signed)
DISCONTINUE OFF PATHWAY REGIMEN - Colorectal   [id number]:FOLFIRI + Panitumumab q14d (**2 cycles per order sheet**):   A cycle is every 14 days:     Panitumumab      Irinotecan      Leucovorin      Fluorouracil      Fluorouracil   **Always confirm dose/schedule in your pharmacy ordering system**  REASON: Disease Progression PRIOR TREATMENT: Off Pathway: FOLFIRI + Panitumumab q14d (**2 cycles per order sheet**) TREATMENT RESPONSE: Progressive Disease (PD)  START ON PATHWAY REGIMEN - Colorectal     A cycle is every 14 days:     Oxaliplatin      Leucovorin      Fluorouracil      Fluorouracil   **Always confirm dose/schedule in your pharmacy ordering system**  Patient Characteristics: Distant Metastases, Nonsurgical Candidate, KRAS/NRAS Wild-Type (BRAF V600 Wild-Type/Unknown), Standard Cytotoxic Therapy, Third Line Standard Cytotoxic Therapy, Prior Anti-EGFR Therapy Tumor Location: Colon Therapeutic Status: Distant Metastases Microsatellite/Mismatch Repair Status: MSS/pMMR BRAF Mutation Status: Wild-Type (no mutation) KRAS/NRAS Mutation Status: Wild-Type (no mutation) Preferred Therapy Approach: Standard Cytotoxic Therapy Standard Cytotoxic Line of Therapy: Third Building services engineer Cytotoxic Therapy Intent of Therapy: Non-Curative / Palliative Intent, Discussed with Patient

## 2021-03-30 NOTE — Patient Instructions (Signed)
°  Medication Instructions:  Your physician recommends that you continue on your current medications as directed. Please refer to the Current Medication list given to you today. --If you need a refill on any your medications before your next appointment, please call your pharmacy first. If no refills are authorized on file call the office.-- Follow-Up: Your next appointment:   Your physician recommends that you schedule a follow-up appointment in: 1 MONTH with Dr. [name]  You will receive a text message or e-mail with a link to a survey about your care and experience with [place] today! We would greatly appreciate your feedback!   Thanks for letting us be apart of your health journey!!  Primary Care and Sports Medicine   Dr. Arlina Robes [place]   We encourage you to activate your patient portal called "MyChart".  Sign up information is provided on this After Visit Summary.  MyChart is used to connect with patients for Virtual Visits (Telemedicine).  Patients are able to view lab/test results, encounter notes, upcoming appointments, etc.  Non-urgent messages can be sent to your provider as well. To learn more about what you can do with MyChart, please visit --  [email or web address].

## 2021-03-30 NOTE — Assessment & Plan Note (Signed)
Patient is scheduled to begin chemotherapy cycle with FOLFOX near the beginning of March Because of his upcoming chemotherapy, she is hesitant to make any significant medication changes

## 2021-03-30 NOTE — Progress Notes (Signed)
[name]   Diagnosis: Colon cancer  INTERVAL HISTORY:   [name] returns for follow-up.  She continues to have intermittent pain at the left abdomen.  She has oxycodone to take if needed.  She prefers Tylenol if this adequately manages her pain.  She denies neuropathy symptoms.  Objective:  Vital signs in last 24 hours:  Blood pressure 117/72, pulse 94, temperature 97.8 F (36.6 C), temperature source Oral, resp. rate 20, height _0  (1.676 m), weight 155 lb 3.2 oz (70.4 kg), SpO2 98 %.    Resp: Lungs clear bilaterally. Cardio: Regular rate and rhythm. GI: Abdomen is soft.  No hepatomegaly.  No mass.  Tender at the left lower abdomen. Vascular: No leg edema. Port-A-Cath without erythema.  Lab Results:  Lab Results  Component Value Date   WBC 2.5 (L) 03/27/2021   HGB 10.5 (L) 03/27/2021   HCT 34.5 (L) 03/27/2021   MCV 75.3 (L) 03/27/2021   PLT 111 (L) 03/27/2021   NEUTROABS 1.3 (L) 01/19/2021    Imaging:  No results found.  Medications: I have reviewed the patient's current medications.  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 The tumor returned microsatellite stable with no loss of mismatch repair protein expression   APC mutated. No BRAF, KRAS, or NRAS mutation On Foundation 1 testing   Cycle 1 adjuvant FOLFOX 09/08/2013   Cycle 2 adjuvant FOLFOX 09/24/2013   Cycle 3 adjuvant FOLFOX 10/08/2013.   Cycle 4 adjuvant FOLFOX 10/22/2013.   Cycle 5 adjuvant FOLFOX 11/05/2013. Oxaliplatin held due to thrombocytopenia. Cycle 6 FOLFOX 11/19/2013. Cycle 7 FOLFOX 12/03/2013. Oxaliplatin held secondary to thrombocytopenia. Cycle 8 FOLFOX 12/17/2013. Cycle 9 FOLFOX 01/04/2014. Oxaliplatin held secondary to neutropenia.   Cycle 10 FOLFOX 01/21/2014. Oxaliplatin held secondary to thrombocytopenia. Cycle 11 FOLFOX  02/04/2014 Cycle 12 FOLFOX 02/18/2014, oxaliplatin dose reduced secondary tothrombocytopenia CT abdomen/pelvis 01/30/2014 revealed splenomegaly and no evidence of recurrent colon cancer CT chest 04/07/2014 with a stable right lower lobe nodule and no evidence for metastatic disease, no nodules seen on the CT 11/26/2014 Markedly elevated CEA 11/24/2014 CT 11/26/2014 revealed a right pelvic mass, splenomegaly, small volume ascites Right salpingo-oophorectomy 12/28/2014 with the pathology confirming metastatic colon cancer CTs 03/23/2016-no evidence of recurrent or metastatic disease. CT 11/26/2016-enlargement of a fluid density structure the right pelvic sidewall, no other evidence of metastatic disease CT aspiration right pelvic cyst 12/19/2016.  Cytology-BENIGN REACTIVE/REPARATIVE CHANGES. CTs 06/05/2017- no evidence of metastatic disease, mild cirrhotic changes with splenomegaly CTs 12/11/2017- recurrent cystic right adnexal mass, similar to on the CT 11/26/2016, stable mild splenomegaly CTs 04/23/2018- enlargement of cystic right adnexal mass with mural nodularity, no other evidence of metastatic disease Cytoreductive surgery/HIPEC with mitomycin by Dr. Clovis Riley at [facility] 08/12/2018-R1 resection achieved.  Cytoreduction included omentectomy, LAR, right salpingo-oophorectomy and left colonic gutter/pelvic stripping.  Pathology on the rectum showed recurrent/metastatic adenocarcinoma, tumor 2.0 cm, predominantly involving the subserosa and muscularis propria of the colon, proximal and distal margins of resection were negative, vascular invasion present, metastatic carcinoma present in 1 out of 5 lymph nodes; omentum resection with no malignancy seen, no metastatic carcinoma identified in 1 lymph node examined; left gutter stripping positive metastatic adenocarcinoma; right ovary resection positive metastatic adenocarcinoma. 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 CEA 68 02/03/2019 Biopsy liver lesion 02/11/2019-metastatic adenocarcinoma consistent with primary colonic adenocarcinoma CTs 02/27/2019-multiple liver lesions increased in  size, new lesion in the lateral right lobe of the liver.  No evidence of metastatic disease in the chest.  Redemonstrated moderate left hydronephrosis and proximal hydroureter without discrete lesion or obstructing etiology at the transition point of the mid ureter. Cycle 1 FOLFIRI/Panitumumab 03/12/2019 Cycle 2 FOLFIRI/Panitumumab 03/26/2019-bolus 5-FU and irinotecan held secondary to neutropenia Cycle 3 FOLFIRI/Panitumumab 04/09/2019, Udenyca added-not given secondary to seizure/discontinuation of the 5-FU pump CT abdomen/pelvis at [facility] 05/04/2019-no residual fluid collection at the left abdominal wall abscess, stable moderate left hydronephrosis, multifocal indeterminate liver lesions--liver lesions significantly improved Cycle 4 FOLFIRI/Panitumumab 05/07/2019, Udenyca Cycle 5 FOLFIRI/Panitumumab 05/20/2019, Udenyca Cycle 6 FOLFIRI/Panitumumab 06/18/2019, Udenyca Cycle 7 FOLFIRI/Panitumumab 07/01/2019 (Irinotecan dose reduced due to thrombocytopenia), Udenyca--Udenyca was not given Cycle 8 FOLFIRI/Panitumumab 07/15/2019, Udenyca Cycle 9 FOLFIRI/Panitumumab 07/29/2019, Udenyca Cycle 10 FOLFIRI/Panitumumab 08/13/2019, Udenyca CTs at [facility] 08/19/2019-decreased size of the hypodense and hyperdense lesions within segment 2 of the left hepatic lobe.  No new lesions identified.  Similar presacral soft tissue thickening with adjacent stable alignment in the rectum.  Improved but persistent left UPJ obstruction with mild left hydronephrosis.  Persistent but decreased size of the wound within the left mid abdomen abdominal wall. Cycle 11 irinotecan/Panitumumab 08/27/2019, Udenyca Cycle 12 irinotecan/Panitumumab 10/08/2019, Udenyca Cycle 13 irinotecan/Panitumumab 11/05/2019, Udenyca CT abdomen/pelvis  11/19/2019-no new or progressive interval findings.  Stable appearance of the calcified lesion in the anterior left hepatic dome with second tiny low-density lesion in the dome of the lateral segment left liver.  Both lesions are markedly decreased since 02/27/2019.  Stable mild fullness left intrarenal collecting system and renal pelvis.  Similar appearance of abnormal soft tissue in the left paramidline subcutaneous fat with associated skin thickening, this likely correlates to the fistula. Cycle 14 irinotecan/Panitumumab 11/19/2019, Udenyca Cycle 15 irinotecan/Panitumumab 12/02/2019, Udenyca CT abdomen[facility]pelvis at [facility] 02/11/2020- similar to slight decrease in segment 2 hypodense and hyperdense lesions (compared to a CT from July 2020), no new lesions, splenomegaly, no fluid collection or abscess, mild stranding adjacent to the incision with overlying skin thickening Cycle 16 irinotecan/Panitumumab 03/03/2020, Udenyca Cycle 17 irinotecan/Panitumumab 03/17/2020, Udenyca Cycle 18 irinotecan/panitumumab 04/07/2020, Udenyca Cycle 19 irinotecan/Panitumumab 04/28/2020, Udenyca Cycle 20 irinotecan/panitumumab 05/26/2020, Udenyca Cycle 21 irinotecan/panitumumab 06/30/2020, Udenyca CT abdomen/pelvis 07/19/2020-mildly increased hepatic steatosis.  Increased size of 2.8 cm hypervascular lesion segment 2 left hepatic lobe. Cycle 22 irinotecan/Panitumumab 07/21/2020, Udenyca MRI Abd 07/28/2020: liver lesions consistent with hepatic metastasis Cycle 23 irinotecan/Panitumumab 08/04/2020, Udenyca  Cycle 24 irinotecan/panitumumab 08/22/2020 Irinotecan/panitumumab discontinued secondary to diarrhea and skin toxicity Guardant 360 on 09/12/2020-K-ras G12V, PIK3CA,NTRK2-VUS, tumor mutation burden 6.85, MSI-high not detected Referred for consideration of Y 90 CTs 10/11/2020-for enlarging hepatic metastatic lesions identified.  No new lesions.  Several tiny nodules in the lungs in the 2 to 3 mm diameter range.  Mild splenomegaly.   Mild left hydronephrosis. 11/17/2020-Y90 lateral segment left lobe of the liver 12/08/2020-Y 90 treatment to the right liver CTs 03/16/2021-multiple new small pulmonary nodules.  Response to therapy in the liver, decreasing size of liver lesions with signs of interval radioembolization; findings suspicious for perianastomotic recurrence in the pelvis; new small volume ascites and some right colonic thickening as well as retroperitoneal thickening. Mild elevation of the CEA beginning January 2016 , normal on 05/19/2014   History of iron deficiency anemia   seizure disorder; seizure 04/09/2019.  Brain CT negative.  Now on Keppra. history of depression   4 mm right lower lobe nodule on a staging chest CT 09/08/2013 , stable on a CT  04/07/2014  Hospitalization 09/24/2013 through 09/26/2013 with fever and abdominal pain.   09/24/2013 urine culture positive for coag negative staph.   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  Scope advanced for approximately 25 cm.  Most proximal portion had necrotic appearing debris.  Colostomy bag insufflated suggesting some type of communication between the pouch and the proximal colon.  Introduction of scope into the ostomy found to available directions.  1 toward the distal pouch with similar appearing necrotic debris encountered.  The other was about a 30 cm segment of normal-appearing colonic mucosa to the level of the previous right hemicolectomy ileocolonic anastomosis. Port-A-Cath placement interventional radiology 03/05/2019 Left leg/foot weakness-brain MRI 09/03/2019 with no evidence of metastatic disease, referred to physical therapy Possible abdominal wall abscess status post evaluation by surgery at [facility] 04/03/2019,  antibiotics initiated; incision and drainage with purulent material removed 04/22/2019 CT at [facility] 05/04/2019-no fluid collection at the site of the left abdominal wall abscess, "indeterminate" liver lesions CT at [facility] 06/04/2019-persistent open wound of the left abdomen, 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  No malignancy.   16. COVID-19 + 09/10/2019 17. Hospitalized with seizure 09/17/2019 through 09/19/2019-MRI brain without evidence of metastatic disease. Seen by neurology. Keppra dose increased. 19.  Rash secondary to Panitumumab 20.  COVID-19 09/22/2020    Disposition: [name] has metastatic colon cancer.  She completed Y90 in September/October 2022.  Restaging scans from earlier this month show multiple new lung nodules, decreased liver lesions, possible perianastomotic recurrence in the pelvis.  Dr. Benay Spice reviewed the CT report/images with [name] and her mother at today's visit.  Treatment options discussed including retreatment with FOLFOX versus Lonsurf versus referral for a clinical trial.  She would like to proceed with FOLFOX.  Dr. 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  We reviewed potential toxicities associated with oxaliplatin including bone marrow toxicity, nausea, allergic reaction (potentially severe), peripheral neuropathy.  We reviewed potential toxicities associated 5-fluorouracil including mouth sores, diarrhea, skin toxicity.  We provided her with printed information as well.  She agrees to proceed.  She will return for cycle 1 FOLFOX on 04/10/2021.  We will see her in follow-up prior to cycle 2 on 04/24/2021.  She will contact the office in the interim with any problems.  Patient seen with Dr.  Benay Spice.   Ned Card ANP/GNP-BC  03/30/2021  8:44 AM  This was a shared visit with Ned Card.  [name] was interviewed and examined.  We discussed the restaging CT findings and reviewed the images with her.  There is disease progression in the lungs and in the pelvis.  The liver lesions appear improved following Y90 therapy.  The etiology of the left lower abdominal pain is unclear.  This could be related to tumor in the abdominal wall or the pelvic recurrence.  We discussed salvage systemic treatment options.  We discussed FOLFOX and Lonsurf therapy.  I am reluctant to recommend bevacizumab given her history of abdominal wound healing/fistulas and possible varices.  The plan is to begin FOLFOX.  She understands the potential of an allergic reaction with a second course of  oxaliplatin.  We discussed the chance of progressive neuropathy.  She agrees to proceed.  I was present for greater than 50% of today's visit.  I performed medical decision making.  Julieanne Manson, MD

## 2021-04-08 ENCOUNTER — Other Ambulatory Visit: Payer: Self-pay | Admitting: Oncology

## 2021-04-10 ENCOUNTER — Inpatient Hospital Stay: Payer: BC Managed Care – PPO

## 2021-04-10 ENCOUNTER — Other Ambulatory Visit: Payer: Self-pay

## 2021-04-10 ENCOUNTER — Other Ambulatory Visit: Payer: Self-pay | Admitting: Nurse Practitioner

## 2021-04-10 ENCOUNTER — Inpatient Hospital Stay: Payer: BC Managed Care – PPO | Attending: Oncology

## 2021-04-10 ENCOUNTER — Telehealth: Payer: Self-pay

## 2021-04-10 VITALS — BP 137/77 | HR 78 | Temp 98.4°F | Resp 18 | Ht 66.0 in | Wt 154.2 lb

## 2021-04-10 DIAGNOSIS — R12 Heartburn: Secondary | ICD-10-CM | POA: Diagnosis not present

## 2021-04-10 DIAGNOSIS — Z452 Encounter for adjustment and management of vascular access device: Secondary | ICD-10-CM | POA: Insufficient documentation

## 2021-04-10 DIAGNOSIS — C182 Malignant neoplasm of ascending colon: Secondary | ICD-10-CM | POA: Insufficient documentation

## 2021-04-10 DIAGNOSIS — R188 Other ascites: Secondary | ICD-10-CM | POA: Insufficient documentation

## 2021-04-10 DIAGNOSIS — G620 Drug-induced polyneuropathy: Secondary | ICD-10-CM | POA: Insufficient documentation

## 2021-04-10 DIAGNOSIS — C7961 Secondary malignant neoplasm of right ovary: Secondary | ICD-10-CM | POA: Diagnosis not present

## 2021-04-10 DIAGNOSIS — Z5111 Encounter for antineoplastic chemotherapy: Secondary | ICD-10-CM | POA: Diagnosis not present

## 2021-04-10 DIAGNOSIS — Z5189 Encounter for other specified aftercare: Secondary | ICD-10-CM | POA: Diagnosis not present

## 2021-04-10 DIAGNOSIS — D6959 Other secondary thrombocytopenia: Secondary | ICD-10-CM | POA: Diagnosis not present

## 2021-04-10 DIAGNOSIS — D509 Iron deficiency anemia, unspecified: Secondary | ICD-10-CM | POA: Diagnosis not present

## 2021-04-10 DIAGNOSIS — C787 Secondary malignant neoplasm of liver and intrahepatic bile duct: Secondary | ICD-10-CM | POA: Diagnosis not present

## 2021-04-10 LAB — CBC WITH DIFFERENTIAL (CANCER CENTER ONLY)
Abs Immature Granulocytes: 0 10*3/uL (ref 0.00–0.07)
Basophils Absolute: 0 10*3/uL (ref 0.0–0.1)
Basophils Relative: 1 %
Eosinophils Absolute: 0.1 10*3/uL (ref 0.0–0.5)
Eosinophils Relative: 3 %
HCT: 35.9 % — ABNORMAL LOW (ref 36.0–46.0)
Hemoglobin: 10.8 g/dL — ABNORMAL LOW (ref 12.0–15.0)
Immature Granulocytes: 0 %
Lymphocytes Relative: 20 %
Lymphs Abs: 0.4 10*3/uL — ABNORMAL LOW (ref 0.7–4.0)
MCH: 23.3 pg — ABNORMAL LOW (ref 26.0–34.0)
MCHC: 30.1 g/dL (ref 30.0–36.0)
MCV: 77.5 fL — ABNORMAL LOW (ref 80.0–100.0)
Monocytes Absolute: 0.2 10*3/uL (ref 0.1–1.0)
Monocytes Relative: 9 %
Neutro Abs: 1.5 10*3/uL — ABNORMAL LOW (ref 1.7–7.7)
Neutrophils Relative %: 67 %
Platelet Count: 104 10*3/uL — ABNORMAL LOW (ref 150–400)
RBC: 4.63 MIL/uL (ref 3.87–5.11)
RDW: 16.2 % — ABNORMAL HIGH (ref 11.5–15.5)
WBC Count: 2.2 10*3/uL — ABNORMAL LOW (ref 4.0–10.5)
nRBC: 0 % (ref 0.0–0.2)

## 2021-04-10 LAB — CMP (CANCER CENTER ONLY)
ALT: 20 U/L (ref 0–44)
AST: 27 U/L (ref 15–41)
Albumin: 3.9 g/dL (ref 3.5–5.0)
Alkaline Phosphatase: 142 U/L — ABNORMAL HIGH (ref 38–126)
Anion gap: 12 (ref 5–15)
BUN: 11 mg/dL (ref 6–20)
CO2: 21 mmol/L — ABNORMAL LOW (ref 22–32)
Calcium: 9.6 mg/dL (ref 8.9–10.3)
Chloride: 102 mmol/L (ref 98–111)
Creatinine: 0.53 mg/dL (ref 0.44–1.00)
GFR, Estimated: >60 mL/min (ref >60)
Glucose, Bld: 418 mg/dL — ABNORMAL HIGH (ref 70–99)
Potassium: 3.9 mmol/L (ref 3.5–5.1)
Sodium: 135 mmol/L (ref 135–145)
Total Bilirubin: 0.9 mg/dL (ref 0.3–1.2)
Total Protein: 7.4 g/dL (ref 6.5–8.1)

## 2021-04-10 LAB — CEA (ACCESS): CEA (CHCC): 180.47 ng/mL — ABNORMAL HIGH (ref 0.00–5.00)

## 2021-04-10 MED ORDER — PALONOSETRON HCL INJECTION 0.25 MG/5ML
0.2500 mg | Freq: Once | INTRAVENOUS | Status: AC
  Administered 2021-04-10: 0.25 mg via INTRAVENOUS
  Filled 2021-04-10: qty 5

## 2021-04-10 MED ORDER — OXALIPLATIN CHEMO INJECTION 100 MG/20ML
83.0000 mg/m2 | Freq: Once | INTRAVENOUS | Status: AC
  Administered 2021-04-10: 150 mg via INTRAVENOUS
  Filled 2021-04-10: qty 20

## 2021-04-10 MED ORDER — DEXTROSE 5 % IV SOLN: Freq: Once | INTRAVENOUS | Status: AC

## 2021-04-10 MED ORDER — LEUCOVORIN CALCIUM INJECTION 350 MG
400.0000 mg/m2 | Freq: Once | INTRAVENOUS | Status: AC
  Administered 2021-04-10: 728 mg via INTRAVENOUS
  Filled 2021-04-10: qty 36.4

## 2021-04-10 MED ORDER — SODIUM CHLORIDE 0.9 % IV SOLN
10.0000 mg | Freq: Once | INTRAVENOUS | Status: AC
  Administered 2021-04-10: 10 mg via INTRAVENOUS
  Filled 2021-04-10: qty 10

## 2021-04-10 MED ORDER — SODIUM CHLORIDE 0.9 % IV SOLN
2400.0000 mg/m2 | INTRAVENOUS | Status: DC
  Administered 2021-04-10: 4350 mg via INTRAVENOUS
  Filled 2021-04-10: qty 87

## 2021-04-10 MED ORDER — FLUOROURACIL CHEMO INJECTION 2.5 GM/50ML
400.0000 mg/m2 | Freq: Once | INTRAVENOUS | Status: AC
  Administered 2021-04-10: 750 mg via INTRAVENOUS
  Filled 2021-04-10: qty 15

## 2021-04-10 NOTE — Progress Notes (Signed)
Patient presents for treatment. RN assessment completed along with the following: ? ?Labs/vitals reviewed - Yes, and within treatment parameters.   ?Weight within 10% of previous measurement - Yes ?Informed consent completed and reflects current therapy/intent - Yes, on date 04/10/2021             ?Provider progress note reviewed - Patient not seen by provider today. Most recent note dated 03/30/21 reviewed. ?Treatment/Antibody/Supportive plan reviewed - Yes, and there are no adjustments needed for today's treatment. ?S&H and other orders reviewed - Yes, and there are no additional orders identified. ?Previous treatment date reviewed - Yes, and the appropriate amount of time has elapsed between treatments. ?Clinic Hand Off Received from - none ? ?Patient to proceed with treatment.  ? ?Discussed labs with patient. ? ?

## 2021-04-10 NOTE — Telephone Encounter (Signed)
Called the patient pcp and left a message to let him know the patient glucose was over 400 today, and she may need a SSI ?

## 2021-04-10 NOTE — Progress Notes (Signed)
Pt complaining of not sleeping well and requesting something to take or do to help. Leander Rams, NP notified ?

## 2021-04-10 NOTE — Patient Instructions (Signed)
[facility]   The chemotherapy medication bag should finish at 46 hours, 96 hours, or 7 days. For example, if your pump is scheduled for 46 hours and it was put on at 4:00 p.m., it should finish at 2:00 p.m. the day it is scheduled to come off regardless of your appointment time.     Estimated time to finish at 12:00 Wednesday, April 12, 2021.   If the display on your pump reads "Low Volume" and it is beeping, take the batteries out of the pump and come to the cancer center for it to be taken off.   If the pump alarms go off prior to the pump reading "Low Volume" then call [phone] and someone can assist you.  If the plunger comes out and the chemotherapy medication is leaking out, please use your home chemo spill kit to clean up the spill. Do NOT use paper towels or other household products.  If you have problems or questions regarding your pump, please call either [phone] (24 hours a day) or the cancer center Monday-Friday 8:00 a.m.- 4:30 p.m. at the clinic number and we will assist you. If you are unable to get assistance, then go to the nearest Emergency Department and ask the staff to contact the IV team for assistance.  Discharge Instructions: Thank you for choosing [facility] to provide your oncology and hematology care.   If you have a lab appointment with the [facility], please go directly to the [facility] and check in at the registration area.   Wear comfortable clothing and clothing appropriate for easy access to any Portacath or PICC line.   We strive to give you quality time with your provider. You may need to reschedule your appointment if you arrive late (15 or more minutes).  Arriving late affects you and other patients whose appointments are after yours.  Also, if you miss three or more appointments without notifying the office, you may be dismissed from the clinic at the providers discretion.      For prescription  refill requests, have your pharmacy contact our office and allow 72 hours for refills to be completed.    Today you received the following chemotherapy and/or immunotherapy agents Oxaliplatin, leucovorin, Fluorouracil      To help prevent nausea and vomiting after your treatment, we encourage you to take your nausea medication as directed.  BELOW ARE SYMPTOMS THAT SHOULD BE REPORTED IMMEDIATELY: *FEVER GREATER THAN 100.4 F (38 C) OR HIGHER *CHILLS OR SWEATING *NAUSEA AND VOMITING THAT IS NOT CONTROLLED WITH YOUR NAUSEA MEDICATION *UNUSUAL SHORTNESS OF BREATH *UNUSUAL BRUISING OR BLEEDING *URINARY PROBLEMS (pain or burning when urinating, or frequent urination) *BOWEL PROBLEMS (unusual diarrhea, constipation, pain near the anus) TENDERNESS IN MOUTH AND THROAT WITH OR WITHOUT PRESENCE OF ULCERS (sore throat, sores in mouth, or a toothache) UNUSUAL RASH, SWELLING OR PAIN  UNUSUAL VAGINAL DISCHARGE OR ITCHING   Items with * indicate a potential emergency and should be followed up as soon as possible or go to the Emergency Department if any problems should occur.  Please show the CHEMOTHERAPY ALERT CARD or IMMUNOTHERAPY ALERT CARD at check-in to the Emergency Department and triage nurse.  Should you have questions after your visit or need to cancel or reschedule your appointment, please contact [facility]  Dept: [phone]  and follow the prompts.  Office hours are 8:00 a.m. to 4:30 p.m. Monday - Friday. Please note that voicemails  left after 4:00 p.m. may not be returned until the following business day.  We are closed weekends and major holidays. You have access to a nurse at all times for urgent questions. Please call the main number to the clinic Dept: [phone] and follow the prompts.   For any non-urgent questions, you may also contact your provider using MyChart. We now offer e-Visits for anyone 23 and older to request care online for non-urgent symptoms.  For details visit [email or web address].   Also download the MyChart app! Go to the app store, search "MyChart", open the app, select [facility], and log in with your MyChart username and password.  Due to Covid, a mask is required upon entering the hospital/clinic. If you do not have a mask, one will be given to you upon arrival. For doctor visits, patients may have 1 support person aged 40 or older with them. For treatment visits, patients cannot have anyone with them due to current Covid guidelines and our immunocompromised population.  Oxaliplatin Injection What is this medication? OXALIPLATIN (ox AL i PLA tin) is a chemotherapy drug. It targets fast dividing cells, like cancer cells, and causes these cells to die. This medicine is used to treat cancers of the colon and rectum, and many other cancers. This medicine may be used for other purposes; ask your health care provider or pharmacist if you have questions. COMMON BRAND NAME(S): Eloxatin What should I tell my care team before I take this medication? They need to know if you have any of these conditions: heart disease history of irregular heartbeat liver disease low blood counts, like white cells, platelets, or red blood cells lung or breathing disease, like asthma take medicines that treat or prevent blood clots tingling of the fingers or toes, or other nerve disorder an unusual or allergic reaction to oxaliplatin, other chemotherapy, other medicines, foods, dyes, or preservatives pregnant or trying to get pregnant breast-feeding How should I use this medication? This drug is given as an infusion into a vein. It is administered in a hospital or clinic by a specially trained health care professional. Talk to your pediatrician regarding the use of this medicine in children. Special care may be needed. Overdosage: If you think you have taken too much of this medicine contact a poison control center or emergency room at once. NOTE:  This medicine is only for you. Do not share this medicine with others. What if I miss a dose? It is important not to miss a dose. Call your doctor or health care professional if you are unable to keep an appointment. What may interact with this medication? Do not take this medicine with any of the following medications: cisapride dronedarone pimozide thioridazine This medicine may also interact with the following medications: aspirin and aspirin-like medicines certain medicines that treat or prevent blood clots like warfarin, apixaban, dabigatran, and rivaroxaban cisplatin cyclosporine diuretics medicines for infection like acyclovir, adefovir, amphotericin B, bacitracin, cidofovir, foscarnet, ganciclovir, gentamicin, pentamidine, vancomycin NSAIDs, medicines for pain and inflammation, like ibuprofen or naproxen other medicines that prolong the QT interval (an abnormal heart rhythm) pamidronate zoledronic acid This list may not describe all possible interactions. Give your health care provider a list of all the medicines, herbs, non-prescription drugs, or dietary supplements you use. Also tell them if you smoke, drink alcohol, or use illegal drugs. Some items may interact with your medicine. What should I watch for while using this medication? Your condition will be monitored carefully while you are receiving this medicine.  You may need blood work done while you are taking this medicine. This medicine may make you feel generally unwell. This is not uncommon as chemotherapy can affect healthy cells as well as cancer cells. Report any side effects. Continue your course of treatment even though you feel ill unless your healthcare professional tells you to stop. This medicine can make you more sensitive to cold. Do not drink cold drinks or use ice. Cover exposed skin before coming in contact with cold temperatures or cold objects. When out in cold weather wear warm clothing and cover your mouth  and nose to warm the air that goes into your lungs. Tell your doctor if you get sensitive to the cold. Do not become pregnant while taking this medicine or for 9 months after stopping it. Women should inform their health care professional if they wish to become pregnant or think they might be pregnant. Men should not father a child while taking this medicine and for 6 months after stopping it. There is potential for serious side effects to an unborn child. Talk to your health care professional for more information. Do not breast-feed a child while taking this medicine or for 3 months after stopping it. This medicine has caused ovarian failure in some women. This medicine may make it more difficult to get pregnant. Talk to your health care professional if you are concerned about your fertility. This medicine has caused decreased sperm counts in some men. This may make it more difficult to father a child. Talk to your health care professional if you are concerned about your fertility. This medicine may increase your risk of getting an infection. Call your health care professional for advice if you get a fever, chills, or sore throat, or other symptoms of a cold or flu. Do not treat yourself. Try to avoid being around people who are sick. Avoid taking medicines that contain aspirin, acetaminophen, ibuprofen, naproxen, or ketoprofen unless instructed by your health care professional. These medicines may hide a fever. Be careful brushing or flossing your teeth or using a toothpick because you may get an infection or bleed more easily. If you have any dental work done, tell your dentist you are receiving this medicine. What side effects may I notice from receiving this medication? Side effects that you should report to your doctor or health care professional as soon as possible: allergic reactions like skin rash, itching or hives, swelling of the face, lips, or tongue breathing problems cough low blood counts  - this medicine may decrease the number of white blood cells, red blood cells, and platelets. You may be at increased risk for infections and bleeding nausea, vomiting pain, redness, or irritation at site where injected pain, tingling, numbness in the hands or feet signs and symptoms of bleeding such as bloody or black, tarry stools; red or dark [place] urine; spitting up blood or [place] material that looks like coffee grounds; red spots on the skin; unusual bruising or bleeding from the eyes, gums, or nose signs and symptoms of a dangerous change in heartbeat or heart rhythm like chest pain; dizziness; fast, irregular heartbeat; palpitations; feeling faint or lightheaded; falls signs and symptoms of infection like fever; chills; cough; sore throat; pain or trouble passing urine signs and symptoms of liver injury like dark yellow or [place] urine; general ill feeling or flu-like symptoms; light-colored stools; loss of appetite; nausea; right upper belly pain; unusually weak or tired; yellowing of the eyes or skin signs and symptoms of low  red blood cells or anemia such as unusually weak or tired; feeling faint or lightheaded; falls signs and symptoms of muscle injury like dark urine; trouble passing urine or change in the amount of urine; unusually weak or tired; muscle pain; back pain Side effects that usually do not require medical attention (report to your doctor or health care professional if they continue or are bothersome): changes in taste diarrhea gas hair loss loss of appetite mouth sores This list may not describe all possible side effects. Call your doctor for medical advice about side effects. You may report side effects to FDA at 1-800-FDA-1088. Where should I keep my medication? This drug is given in a hospital or clinic and will not be stored at home. NOTE: This sheet is a summary. It may not cover all possible information. If you have questions about this medicine, talk to your doctor,  pharmacist, or health care provider.  2022 Elsevier/Gold Standard (2020-10-11 00:00:00)  Leucovorin injection What is this medication? LEUCOVORIN (loo koe VOR in) is used to prevent or treat the harmful effects of some medicines. This medicine is used to treat anemia caused by a low amount of folic acid in the body. It is also used with 5-fluorouracil (5-FU) to treat colon cancer. This medicine may be used for other purposes; ask your health care provider or pharmacist if you have questions. What should I tell my care team before I take this medication? They need to know if you have any of these conditions: anemia from low levels of vitamin B-12 in the blood an unusual or allergic reaction to leucovorin, folic acid, other medicines, foods, dyes, or preservatives pregnant or trying to get pregnant breast-feeding How should I use this medication? This medicine is for injection into a muscle or into a vein. It is given by a health care professional in a hospital or clinic setting. Talk to your pediatrician regarding the use of this medicine in children. Special care may be needed. Overdosage: If you think you have taken too much of this medicine contact a poison control center or emergency room at once. NOTE: This medicine is only for you. Do not share this medicine with others. What if I miss a dose? This does not apply. What may interact with this medication? capecitabine fluorouracil phenobarbital phenytoin primidone trimethoprim-sulfamethoxazole This list may not describe all possible interactions. Give your health care provider a list of all the medicines, herbs, non-prescription drugs, or dietary supplements you use. Also tell them if you smoke, drink alcohol, or use illegal drugs. Some items may interact with your medicine. What should I watch for while using this medication? Your condition will be monitored carefully while you are receiving this medicine. This medicine may  increase the side effects of 5-fluorouracil, 5-FU. Tell your doctor or health care professional if you have diarrhea or mouth sores that do not get better or that get worse. What side effects may I notice from receiving this medication? Side effects that you should report to your doctor or health care professional as soon as possible: allergic reactions like skin rash, itching or hives, swelling of the face, lips, or tongue breathing problems fever, infection mouth sores unusual bleeding or bruising unusually weak or tired Side effects that usually do not require medical attention (report to your doctor or health care professional if they continue or are bothersome): constipation or diarrhea loss of appetite nausea, vomiting This list may not describe all possible side effects. Call your doctor for medical advice  about side effects. You may report side effects to FDA at 1-800-FDA-1088. Where should I keep my medication? This drug is given in a hospital or clinic and will not be stored at home. NOTE: This sheet is a summary. It may not cover all possible information. If you have questions about this medicine, talk to your doctor, pharmacist, or health care provider.  2022 Elsevier/Gold Standard (2007-07-31 00:00:00)  Fluorouracil, 5-FU injection What is this medication? FLUOROURACIL, 5-FU (flure oh YOOR a sil) is a chemotherapy drug. It slows the growth of cancer cells. This medicine is used to treat many types of cancer like breast cancer, colon or rectal cancer, pancreatic cancer, and stomach cancer. This medicine may be used for other purposes; ask your health care provider or pharmacist if you have questions. COMMON BRAND NAME(S): Adrucil What should I tell my care team before I take this medication? They need to know if you have any of these conditions: blood disorders dihydropyrimidine dehydrogenase (DPD) deficiency infection (especially a virus infection such as chickenpox, cold  sores, or herpes) kidney disease liver disease malnourished, poor nutrition recent or ongoing radiation therapy an unusual or allergic reaction to fluorouracil, other chemotherapy, other medicines, foods, dyes, or preservatives pregnant or trying to get pregnant breast-feeding How should I use this medication? This drug is given as an infusion or injection into a vein. It is administered in a hospital or clinic by a specially trained health care professional. Talk to your pediatrician regarding the use of this medicine in children. Special care may be needed. Overdosage: If you think you have taken too much of this medicine contact a poison control center or emergency room at once. NOTE: This medicine is only for you. Do not share this medicine with others. What if I miss a dose? It is important not to miss your dose. Call your doctor or health care professional if you are unable to keep an appointment. What may interact with this medication? Do not take this medicine with any of the following medications: live virus vaccines This medicine may also interact with the following medications: medicines that treat or prevent blood clots like warfarin, enoxaparin, and dalteparin This list may not describe all possible interactions. Give your health care provider a list of all the medicines, herbs, non-prescription drugs, or dietary supplements you use. Also tell them if you smoke, drink alcohol, or use illegal drugs. Some items may interact with your medicine. What should I watch for while using this medication? Visit your doctor for checks on your progress. This drug may make you feel generally unwell. This is not uncommon, as chemotherapy can affect healthy cells as well as cancer cells. Report any side effects. Continue your course of treatment even though you feel ill unless your doctor tells you to stop. In some cases, you may be given additional medicines to help with side effects. Follow all  directions for their use. Call your doctor or health care professional for advice if you get a fever, chills or sore throat, or other symptoms of a cold or flu. Do not treat yourself. This drug decreases your body's ability to fight infections. Try to avoid being around people who are sick. This medicine may increase your risk to bruise or bleed. Call your doctor or health care professional if you notice any unusual bleeding. Be careful brushing and flossing your teeth or using a toothpick because you may get an infection or bleed more easily. If you have any dental work done, tell  your dentist you are receiving this medicine. Avoid taking products that contain aspirin, acetaminophen, ibuprofen, naproxen, or ketoprofen unless instructed by your doctor. These medicines may hide a fever. Do not become pregnant while taking this medicine. Women should inform their doctor if they wish to become pregnant or think they might be pregnant. There is a potential for serious side effects to an unborn child. Talk to your health care professional or pharmacist for more information. Do not breast-feed an infant while taking this medicine. Men should inform their doctor if they wish to father a child. This medicine may lower sperm counts. Do not treat diarrhea with over the counter products. Contact your doctor if you have diarrhea that lasts more than 2 days or if it is severe and watery. This medicine can make you more sensitive to the sun. Keep out of the sun. If you cannot avoid being in the sun, wear protective clothing and use sunscreen. Do not use sun lamps or tanning beds/booths. What side effects may I notice from receiving this medication? Side effects that you should report to your doctor or health care professional as soon as possible: allergic reactions like skin rash, itching or hives, swelling of the face, lips, or tongue low blood counts - this medicine may decrease the number of white blood cells, red  blood cells and platelets. You may be at increased risk for infections and bleeding. signs of infection - fever or chills, cough, sore throat, pain or difficulty passing urine signs of decreased platelets or bleeding - bruising, pinpoint red spots on the skin, black, tarry stools, blood in the urine signs of decreased red blood cells - unusually weak or tired, fainting spells, lightheadedness breathing problems changes in vision chest pain mouth sores nausea and vomiting pain, swelling, redness at site where injected pain, tingling, numbness in the hands or feet redness, swelling, or sores on hands or feet stomach pain unusual bleeding Side effects that usually do not require medical attention (report to your doctor or health care professional if they continue or are bothersome): changes in finger or toe nails diarrhea dry or itchy skin hair loss headache loss of appetite sensitivity of eyes to the light stomach upset unusually teary eyes This list may not describe all possible side effects. Call your doctor for medical advice about side effects. You may report side effects to FDA at 1-800-FDA-1088. Where should I keep my medication? This drug is given in a hospital or clinic and will not be stored at home. NOTE: This sheet is a summary. It may not cover all possible information. If you have questions about this medicine, talk to your doctor, pharmacist, or health care provider.  2022 Elsevier/Gold Standard (2020-10-11 00:00:00)

## 2021-04-11 ENCOUNTER — Telehealth: Payer: Self-pay

## 2021-04-11 ENCOUNTER — Inpatient Hospital Stay: Payer: BC Managed Care – PPO

## 2021-04-11 ENCOUNTER — Other Ambulatory Visit: Payer: Self-pay

## 2021-04-11 ENCOUNTER — Inpatient Hospital Stay (HOSPITAL_BASED_OUTPATIENT_CLINIC_OR_DEPARTMENT_OTHER): Payer: BC Managed Care – PPO | Admitting: Nurse Practitioner

## 2021-04-11 VITALS — BP 112/60 | HR 92 | Temp 99.9°F | Resp 19

## 2021-04-11 VITALS — BP 134/65 | HR 117 | Temp 100.7°F | Resp 18 | Ht 66.0 in | Wt 154.8 lb

## 2021-04-11 DIAGNOSIS — C787 Secondary malignant neoplasm of liver and intrahepatic bile duct: Secondary | ICD-10-CM | POA: Diagnosis not present

## 2021-04-11 DIAGNOSIS — D509 Iron deficiency anemia, unspecified: Secondary | ICD-10-CM | POA: Diagnosis not present

## 2021-04-11 DIAGNOSIS — C189 Malignant neoplasm of colon, unspecified: Secondary | ICD-10-CM | POA: Diagnosis not present

## 2021-04-11 DIAGNOSIS — C182 Malignant neoplasm of ascending colon: Secondary | ICD-10-CM | POA: Diagnosis not present

## 2021-04-11 DIAGNOSIS — G620 Drug-induced polyneuropathy: Secondary | ICD-10-CM | POA: Diagnosis not present

## 2021-04-11 DIAGNOSIS — D6959 Other secondary thrombocytopenia: Secondary | ICD-10-CM | POA: Diagnosis not present

## 2021-04-11 DIAGNOSIS — R188 Other ascites: Secondary | ICD-10-CM | POA: Diagnosis not present

## 2021-04-11 DIAGNOSIS — R12 Heartburn: Secondary | ICD-10-CM | POA: Diagnosis not present

## 2021-04-11 DIAGNOSIS — Z5111 Encounter for antineoplastic chemotherapy: Secondary | ICD-10-CM | POA: Diagnosis not present

## 2021-04-11 DIAGNOSIS — Z5189 Encounter for other specified aftercare: Secondary | ICD-10-CM | POA: Diagnosis not present

## 2021-04-11 DIAGNOSIS — C7961 Secondary malignant neoplasm of right ovary: Secondary | ICD-10-CM | POA: Diagnosis not present

## 2021-04-11 DIAGNOSIS — Z452 Encounter for adjustment and management of vascular access device: Secondary | ICD-10-CM | POA: Diagnosis not present

## 2021-04-11 LAB — URINALYSIS, COMPLETE (UACMP) WITH MICROSCOPIC
Bilirubin Urine: NEGATIVE
Glucose, UA: >1000 mg/dL — AB
Ketones, ur: NEGATIVE mg/dL
Nitrite: NEGATIVE
Protein, ur: 30 mg/dL — AB
Specific Gravity, Urine: 1.044 — ABNORMAL HIGH (ref 1.005–1.030)
WBC, UA: >50 WBC/hpf — ABNORMAL HIGH (ref 0–5)
pH: 5.5 (ref 5.0–8.0)

## 2021-04-11 MED ORDER — SODIUM CHLORIDE 0.9 % IV SOLN: INTRAVENOUS | Status: DC

## 2021-04-11 MED ORDER — CEFUROXIME AXETIL 500 MG PO TABS: 500.0000 mg | ORAL_TABLET | Freq: Two times a day (BID) | ORAL | 0 refills | Status: AC

## 2021-04-11 MED ORDER — SODIUM CHLORIDE 0.9 % IV SOLN: Freq: Once | INTRAVENOUS | Status: AC

## 2021-04-11 MED ORDER — SODIUM CHLORIDE 0.9 % IV SOLN
2.0000 g | Freq: Once | INTRAVENOUS | Status: AC
  Administered 2021-04-11: 2 g via INTRAVENOUS
  Filled 2021-04-11: qty 2

## 2021-04-11 MED ORDER — ACETAMINOPHEN 325 MG PO TABS
650.0000 mg | ORAL_TABLET | Freq: Once | ORAL | Status: AC
  Administered 2021-04-11: 650 mg via ORAL
  Filled 2021-04-11: qty 2

## 2021-04-11 NOTE — Progress Notes (Signed)
CBG 334. Emily Rams, NP notified ?

## 2021-04-11 NOTE — Progress Notes (Signed)
Dr Benay Spice notified of pt vital signs and CBG. Dr Benay Spice at bedside. Ok to discharge ?

## 2021-04-11 NOTE — Progress Notes (Signed)
[facility] OFF[facility] PROGRESS NOTE   Diagnosis: Colon [name]  INTERVAL HISTORY:   [name] returns prior to scheduled follow-up for evaluation of a fever.  She began cycle 1 FOLFOX yesterday.  This morning she felt nauseated.  She did not vomit.  She went to work.  She began "shivering" and went home.  Temperature at home was 102.3.  She feels "exhausted".  No sore throat.  No shortness of breath.  No discomfort at the Port-A-Cath.  No cough or shortness of breath.  No diarrhea.  Previous abdominal wound remains healed.  Objective:  Vital signs in last 24 hours:  Blood pressure 134/65, pulse (!) 117, temperature (!) 100.7 F (38.2 C), temperature source Oral, resp. rate 18, height _0  (1.676 m), weight 154 lb 12.8 oz (70.2 kg), SpO2 98 %.    HEENT: Geographic tongue.  No thrush or ulcers. Resp: Lungs clear bilaterally. Cardio: Regular, tachycardic. GI: Abdomen soft and nontender.  No hepatosplenomegaly.  Healed surgical incisions. Vascular: No leg edema. Neuro: Alert and oriented. Skin: Warm and dry. Port-A-Cath nontender and without erythema.   Lab Results:  Lab Results  Component Value Date   WBC 2.2 (L) 04/10/2021   HGB 10.8 (L) 04/10/2021   HCT 35.9 (L) 04/10/2021   MCV 77.5 (L) 04/10/2021   PLT 104 (L) 04/10/2021   NEUTROABS 1.5 (L) 04/10/2021    Imaging:  No results found.  Medications: I have reviewed the patient's current medications.  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 The tumor returned microsatellite stable with no loss of mismatch repair protein expression   APC mutated. No BRAF, KRAS, or NRAS mutation On Foundation 1 testing   Cycle 1 adjuvant FOLFOX 09/08/2013   Cycle 2 adjuvant FOLFOX 09/24/2013   Cycle 3 adjuvant FOLFOX 10/08/2013.   Cycle 4 adjuvant FOLFOX 10/22/2013.   Cycle 5 adjuvant FOLFOX 11/05/2013.  Oxaliplatin held due to thrombocytopenia. Cycle 6 FOLFOX 11/19/2013. Cycle 7 FOLFOX 12/03/2013. Oxaliplatin held secondary to thrombocytopenia. Cycle 8 FOLFOX 12/17/2013. Cycle 9 FOLFOX 01/04/2014. Oxaliplatin held secondary to neutropenia.   Cycle 10 FOLFOX 01/21/2014. Oxaliplatin held secondary to thrombocytopenia. Cycle 11 FOLFOX 02/04/2014 Cycle 12 FOLFOX 02/18/2014, oxaliplatin dose reduced secondary tothrombocytopenia CT abdomen/pelvis 01/30/2014 revealed splenomegaly and no evidence of recurrent colon [name] CT chest 04/07/2014 with a stable right lower lobe nodule and no evidence for metastatic disease, no nodules seen on the CT 11/26/2014 Markedly elevated CEA 11/24/2014 CT 11/26/2014 revealed a right pelvic mass, splenomegaly, small volume ascites Right salpingo-oophorectomy 12/28/2014 with the pathology confirming metastatic colon [name] CTs 03/23/2016-no evidence of recurrent or metastatic disease. CT 11/26/2016-enlargement of a fluid density structure the right pelvic sidewall, no other evidence of metastatic disease CT aspiration right pelvic cyst 12/19/2016.  Cytology-BENIGN REACTIVE/REPARATIVE CHANGES. CTs 06/05/2017- no evidence of metastatic disease, mild cirrhotic changes with splenomegaly CTs 12/11/2017- recurrent cystic right adnexal mass, similar to on the CT 11/26/2016, stable mild splenomegaly CTs 04/23/2018- enlargement of cystic right adnexal mass with mural nodularity, no other evidence of metastatic disease Cytoreductive surgery/HIPEC with mitomycin by Dr. Clovis Riley at [facility] 08/12/2018-R1 resection achieved.  Cytoreduction included omentectomy, LAR, right salpingo-oophorectomy and left colonic gutter/pelvic stripping.  Pathology on the rectum showed recurrent/metastatic adenocarcinoma, tumor 2.0 cm, predominantly involving the subserosa and muscularis propria of the colon, proximal and distal margins of resection were negative, vascular invasion present, metastatic carcinoma  present in 1 out of 5 lymph nodes; omentum  resection with no malignancy seen, no metastatic carcinoma identified in 1 lymph node examined; left gutter stripping positive metastatic adenocarcinoma; right ovary resection positive metastatic adenocarcinoma. 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 CEA 68 02/03/2019 Biopsy liver lesion 02/11/2019-metastatic adenocarcinoma consistent with primary colonic adenocarcinoma CTs 02/27/2019-multiple liver lesions increased in size, new lesion in the lateral right lobe of the liver.  No evidence of metastatic disease in the chest.  Redemonstrated moderate left hydronephrosis and proximal hydroureter without discrete lesion or obstructing etiology at the transition point of the mid ureter. Cycle 1 FOLFIRI/Panitumumab 03/12/2019 Cycle 2 FOLFIRI/Panitumumab 03/26/2019-bolus 5-FU and irinotecan held secondary to neutropenia Cycle 3 FOLFIRI/Panitumumab 04/09/2019, Udenyca added-not given secondary to seizure/discontinuation of the 5-FU pump CT abdomen/pelvis at [facility] 05/04/2019-no residual fluid collection at the left abdominal wall abscess, stable moderate left hydronephrosis, multifocal indeterminate liver lesions--liver lesions significantly improved Cycle 4 FOLFIRI/Panitumumab 05/07/2019, Udenyca Cycle 5 FOLFIRI/Panitumumab 05/20/2019, Udenyca Cycle 6 FOLFIRI/Panitumumab 06/18/2019, Udenyca Cycle 7 FOLFIRI/Panitumumab 07/01/2019 (Irinotecan dose reduced due to thrombocytopenia), Udenyca--Udenyca was not given Cycle 8 FOLFIRI/Panitumumab 07/15/2019, Udenyca Cycle 9 FOLFIRI/Panitumumab 07/29/2019, Udenyca Cycle 10 FOLFIRI/Panitumumab 08/13/2019, Udenyca CTs at [facility] 08/19/2019-decreased size of the hypodense and hyperdense lesions within segment 2 of the left hepatic lobe.  No new lesions identified.  Similar presacral soft tissue thickening with adjacent stable alignment in the rectum.  Improved but  persistent left UPJ obstruction with mild left hydronephrosis.  Persistent but decreased size of the wound within the left mid abdomen abdominal wall. Cycle 11 irinotecan/Panitumumab 08/27/2019, Udenyca Cycle 12 irinotecan/Panitumumab 10/08/2019, Udenyca Cycle 13 irinotecan/Panitumumab 11/05/2019, Udenyca CT abdomen/pelvis 11/19/2019-no new or progressive interval findings.  Stable appearance of the calcified lesion in the anterior left hepatic dome with second tiny low-density lesion in the dome of the lateral segment left liver.  Both lesions are markedly decreased since 02/27/2019.  Stable mild fullness left intrarenal collecting system and renal pelvis.  Similar appearance of abnormal soft tissue in the left paramidline subcutaneous fat with associated skin thickening, this likely correlates to the fistula. Cycle 14 irinotecan/Panitumumab 11/19/2019, Udenyca Cycle 15 irinotecan/Panitumumab 12/02/2019, Udenyca CT abdomen[facility]pelvis at [facility] 02/11/2020- similar to slight decrease in segment 2 hypodense and hyperdense lesions (compared to a CT from July 2020), no new lesions, splenomegaly, no fluid collection or abscess, mild stranding adjacent to the incision with overlying skin thickening Cycle 16 irinotecan/Panitumumab 03/03/2020, Udenyca Cycle 17 irinotecan/Panitumumab 03/17/2020, Udenyca Cycle 18 irinotecan/panitumumab 04/07/2020, Udenyca Cycle 19 irinotecan/Panitumumab 04/28/2020, Udenyca Cycle 20 irinotecan/panitumumab 05/26/2020, Udenyca Cycle 21 irinotecan/panitumumab 06/30/2020, Udenyca CT abdomen/pelvis 07/19/2020-mildly increased hepatic steatosis.  Increased size of 2.8 cm hypervascular lesion segment 2 left hepatic lobe. Cycle 22 irinotecan/Panitumumab 07/21/2020, Udenyca MRI Abd 07/28/2020: liver lesions consistent with hepatic metastasis Cycle 23 irinotecan/Panitumumab 08/04/2020, Udenyca  Cycle 24 irinotecan/panitumumab 08/22/2020 Irinotecan/panitumumab discontinued secondary to diarrhea and skin  toxicity Guardant 360 on 09/12/2020-K-ras G12V, PIK3CA,NTRK2-VUS, tumor mutation burden 6.85, MSI-high not detected Referred for consideration of Y 90 CTs 10/11/2020-for enlarging hepatic metastatic lesions identified.  No new lesions.  Several tiny nodules in the lungs in the 2 to 3 mm diameter range.  Mild splenomegaly.  Mild left hydronephrosis. 11/17/2020-Y90 lateral segment left lobe of the liver 12/08/2020-Y 90 treatment to the right liver CTs 03/16/2021-multiple new small pulmonary nodules.  Response to therapy in the liver, decreasing size of liver lesions with signs of interval radioembolization; findings suspicious for perianastomotic recurrence in the pelvis; new small volume ascites and some right colonic thickening as well  as retroperitoneal thickening. Cycle 1 FOLFOX 04/10/2021 Mild elevation of the CEA beginning January 2016 , normal on 05/19/2014   History of iron deficiency anemia   seizure disorder; seizure 04/09/2019.  Brain CT negative.  Now on Keppra. history of depression   4 mm right lower lobe nodule on a staging chest CT 09/08/2013 , stable on a CT 04/07/2014  Hospitalization 09/24/2013 through 09/26/2013 with fever and abdominal pain.   09/24/2013 urine culture positive for coag negative staph.   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  Scope advanced for approximately 25 cm.  Most proximal portion had necrotic appearing debris.  Colostomy bag insufflated suggesting some type of communication between the pouch and the proximal colon.  Introduction of scope into the ostomy found to available directions.  1 toward the distal pouch with similar appearing necrotic debris encountered.  The other was about a 30 cm segment of normal-appearing  colonic mucosa to the level of the previous right hemicolectomy ileocolonic anastomosis. Port-A-Cath placement interventional radiology 03/05/2019 Left leg/foot weakness-brain MRI 09/03/2019 with no evidence of metastatic disease, referred to physical therapy Possible abdominal wall abscess status post evaluation by surgery at [facility] 04/03/2019, antibiotics initiated; incision and drainage with purulent material removed 04/22/2019 CT at [facility] 05/04/2019-no fluid collection at the site of the left abdominal wall abscess, "indeterminate" liver lesions CT at [facility] 06/04/2019-persistent open wound of the left abdomen, 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  No malignancy.   16. COVID-19 + 09/10/2019 17. Hospitalized with seizure 09/17/2019 through 09/19/2019-MRI brain without evidence of metastatic disease. Seen by neurology. Keppra dose increased. 19.  Rash secondary to Panitumumab 20.  COVID-19 09/22/2020  Disposition: [name] began cycle 1 FOLFOX yesterday.  She developed fever and chills earlier today.  No obvious source of infection.  She is not ill-appearing.  We discussed the possibility the fever is related to oxaliplatin.  We will obtain a blood culture, UA and urine culture.  She will receive a dose of cefepime today, begin a 7-day course of cefuroxime tomorrow.  She understands to proceed to the emergency department if she begins to feel ill, temperature greater than 102.  Patient seen with Dr. Benay Spice.    Ned Card ANP/GNP-BC   04/11/2021  2:25 PM This was a shared visit with Ned Card.  [name] was interviewed and examined.  She completed cycle 1 of salvage chemotherapy with FOLFOX beginning yesterday.  She developed a fever and chills today.  She has a low-grade fever at present.  There is no apparent site of infection.  It is possible  the fever is related to an unrecognized infection or a reaction to oxaliplatin.  The Port-A-Cath does not appear infected, but this is possible.  She will receive intravenous antibiotics followed by a course of cefuroxime.  We will obtain cultures and a urinalysis.  The 5-FU infusion will continue.  She will seek medical attention for recurrent high fever or new symptoms.  [facility] will [name] as scheduled tomorrow.  I was present for greater than 50% of today's visit.  I performed medical decision making.  Julieanne Manson, MD

## 2021-04-11 NOTE — Patient Instructions (Signed)
Rehydration, Adult °Rehydration is the replacement of body fluids, salts, and minerals (electrolytes) that are lost during dehydration. Dehydration is when there is not enough water or other fluids in the body. This happens when you lose more fluids than you take in. Common causes of dehydration include: °Not drinking enough fluids. This can occur when you are ill or doing activities that require a lot of energy, especially in hot weather. °Conditions that cause loss of water or other fluids, such as diarrhea, vomiting, sweating, or urinating a lot. °Other illnesses, such as fever or infection. °Certain medicines, such as those that remove excess fluid from the body (diuretics). °Symptoms of mild or moderate dehydration may include thirst, dry lips and mouth, and dizziness. Symptoms of severe dehydration may include increased heart rate, confusion, fainting, and not urinating. °For severe dehydration, you may need to get fluids through an IV at the hospital. For mild or moderate dehydration, you can usually rehydrate at home by drinking certain fluids as told by your health care provider. °What are the risks? °Generally, rehydration is safe. However, taking in too much fluid (overhydration) can be a problem. This is rare. Overhydration can cause an electrolyte imbalance, kidney failure, or a decrease in salt (sodium) levels in the body. °Supplies needed °You will need an oral rehydration solution (ORS) if your health care provider tells you to use one. This is a drink to treat dehydration. It can be found in pharmacies and retail stores. °How to rehydrate °Fluids °Follow instructions from your health care provider for rehydration. The kind of fluid and the amount you should drink depend on your condition. In general, you should choose drinks that you prefer. °If told by your health care provider, drink an ORS. °Make an ORS by following instructions on the package. °Start by drinking small amounts, about ½ cup (120  mL) every 5-10 minutes. °Slowly increase how much you drink until you have taken the amount recommended by your health care provider. °Drink enough clear fluids to keep your urine pale yellow. If you were told to drink an ORS, finish it first, then start slowly drinking other clear fluids. Drink fluids such as: °Water. This includes sparkling water and flavored water. Drinking only water can lead to having too little sodium in your body (hyponatremia). Follow the advice of your health care provider. °Water from ice chips you suck on. °Fruit juice with water you add to it (diluted). °Sports drinks. °Hot or cold herbal teas. °Broth-based soups. °Milk or milk products. °Food °Follow instructions from your health care provider about what to eat while you rehydrate. Your health care provider may recommend that you slowly begin eating regular foods in small amounts. °Eat foods that contain a healthy balance of electrolytes, such as bananas, oranges, potatoes, tomatoes, and spinach. °Avoid foods that are greasy or contain a lot of sugar. °In some cases, you may get nutrition through a feeding tube that is passed through your nose and into your stomach (nasogastric tube, or NG tube). This may be done if you have uncontrolled vomiting or diarrhea. °Beverages to avoid °Certain beverages may make dehydration worse. While you rehydrate, avoid drinking alcohol. °How to tell if you are recovering from dehydration °You may be recovering from dehydration if: °You are urinating more often than before you started rehydrating. °Your urine is pale yellow. °Your energy level improves. °You vomit less frequently. °You have diarrhea less frequently. °Your appetite improves or returns to normal. °You feel less dizzy or less light-headed. °Your   skin tone and color start to look more normal. Follow these instructions at home: Take over-the-counter and prescription medicines only as told by your health care provider. Do not take sodium  tablets. Doing this can lead to having too much sodium in your body (hypernatremia). Contact a health care provider if: You continue to have symptoms of mild or moderate dehydration, such as: Thirst. Dry lips. Slightly dry mouth. Dizziness. Dark urine or less urine than normal. Muscle cramps. You continue to vomit or have diarrhea. Get help right away if you: Have symptoms of dehydration that get worse. Have a fever. Have a severe headache. Have been vomiting and the following happens: Your vomiting gets worse or does not go away. Your vomit includes blood or green matter (bile). You cannot eat or drink without vomiting. Have problems with urination or bowel movements, such as: Diarrhea that gets worse or does not go away. Blood in your stool (feces). This may cause stool to look black and tarry. Not urinating, or urinating only a small amount of very dark urine, within 6-8 hours. Have trouble breathing. Have symptoms that get worse with treatment. These symptoms may represent a serious problem that is an emergency. Do not wait to see if the symptoms will go away. Get medical help right away. Call your local emergency services (911 in the [place] Do not drive yourself to the hospital. Summary Rehydration is the replacement of body fluids and minerals (electrolytes) that are lost during dehydration. Follow instructions from your health care provider for rehydration. The kind of fluid and amount you should drink depend on your condition. Slowly increase how much you drink until you have taken the amount recommended by your health care provider. Contact your health care provider if you continue to show signs of mild or moderate dehydration. This information is not intended to replace advice given to you by your health care provider. Make sure you discuss any questions you have with your health care provider. Document Revised: 03/25/2019 Document Reviewed: 02/02/2019 Elsevier Patient  Education  Eugenio Saenz.  Cefepime Injection What is this medication? CEFEPIME (SEF e pim) is a cephalosporin antibiotic. It treats some infections caused by bacteria. It will not work for colds, the flu, or other viruses. This medicine may be used for other purposes; ask your health care provider or pharmacist if you have questions. COMMON BRAND NAME(S): Maxipime What should I tell my care team before I take this medication? They need to know if you have any of these conditions: bleeding problems kidney disease stomach, intestinal problems like colitis an unusual or allergic reaction to cefepime, other cephalosporin or penicillin antibiotics, imipenem, other foods, dyes or preservatives pregnant or trying to get pregnant breast-feeding How should I use this medication? This drug is injected into a muscle or a vein. It is usually given by a health care provider in a hospital or clinic setting. If you get this drug at home, you will be taught how to prepare and give it. Use exactly as directed. Take it as directed on the prescription label at the same time every day. Keep taking it unless your health care provider tells you to stop. It is important that you put your used needles and syringes in a special sharps container. Do not put them in a trash can. If you do not have a sharps container, call your pharmacist or health care provider to get one. Talk to your health care provider about the use of this drug in children.  While it may be prescribed for children as young as 2 months for selected conditions, precautions do apply. Overdosage: If you think you have taken too much of this medicine contact a poison control center or emergency room at once. NOTE: This medicine is only for you. Do not share this medicine with others. What if I miss a dose? It is important not to miss your dose. Call your health care provider if you are unable to keep an appointment. If you give yourself this drug  at home and you miss a dose, take it as soon as you can. If it is almost time for your next dose, take only that dose. Do not take double or extra doses. What may interact with this medication? This medicine may interact with the following medications: birth control pills certain medicines for infection like amikacin, gentamicin, tobramycin diuretics This list may not describe all possible interactions. Give your health care provider a list of all the medicines, herbs, non-prescription drugs, or dietary supplements you use. Also tell them if you smoke, drink alcohol, or use illegal drugs. Some items may interact with your medicine. What should I watch for while using this medication? Tell your doctor or health care provider if your symptoms do not begin to improve or if you get new symptoms. Your doctor will monitor your condition and blood work as needed. This medicine may cause serious skin reactions. They can happen weeks to months after starting the medicine. Contact your health care provider right away if you notice fevers or flu-like symptoms with a rash. The rash may be red or purple and then turn into blisters or peeling of the skin. Or, you might notice a red rash with swelling of the face, lips or lymph nodes in your neck or under your arms. Do not treat diarrhea with over-the-counter products. Contact your doctor if you have diarrhea that lasts more than 2 days or if the diarrhea is severe and watery. This medicine can interfere with some urine glucose tests. If you use such tests, talk with your health care provider. What side effects may I notice from receiving this medication? Side effects that you should report to your doctor or health care professional as soon as possible: allergic reactions like skin rash, itching or hives, swelling of the face, lips, or tongue confusion, hallucinations difficulty breathing, wheezing fever pain or difficulty passing urine redness, blistering,  peeling or loosening of the skin, including inside the mouth seizures stiff, rigid muscles unusual bleeding, bruising unusually weak or tired Side effects that usually do not require medical attention (report to your doctor or health care professional if they continue or are bothersome): diarrhea headache mouth sores, irritation nausea, vomiting pain, swelling and irritation at the injection site This list may not describe all possible side effects. Call your doctor for medical advice about side effects. You may report side effects to FDA at 1-800-FDA-1088. Where should I keep my medication? Keep out of the reach of children and pets. You will be instructed on how to store this drug. Protect from light. Throw away any unused drug after the expiration date. NOTE: This sheet is a summary. It may not cover all possible information. If you have questions about this medicine, talk to your doctor, pharmacist, or health care provider.  2022 Elsevier/Gold Standard (2018-10-15 00:00:00)

## 2021-04-11 NOTE — Telephone Encounter (Signed)
TC from Pt stating she is running a fever of 102.3 and shivering. Pt had treatment yesterday restart of folfox. Discussed with Dr Benay Spice Pt informed to come in to the office. ?

## 2021-04-12 ENCOUNTER — Telehealth: Payer: Self-pay

## 2021-04-12 ENCOUNTER — Inpatient Hospital Stay: Payer: BC Managed Care – PPO

## 2021-04-12 ENCOUNTER — Other Ambulatory Visit: Payer: Self-pay

## 2021-04-12 ENCOUNTER — Telehealth: Payer: Self-pay | Admitting: Nurse Practitioner

## 2021-04-12 VITALS — BP 134/78 | HR 91 | Temp 99.3°F | Resp 18

## 2021-04-12 DIAGNOSIS — Z5189 Encounter for other specified aftercare: Secondary | ICD-10-CM | POA: Diagnosis not present

## 2021-04-12 DIAGNOSIS — C182 Malignant neoplasm of ascending colon: Secondary | ICD-10-CM | POA: Diagnosis not present

## 2021-04-12 DIAGNOSIS — C7961 Secondary malignant neoplasm of right ovary: Secondary | ICD-10-CM | POA: Diagnosis not present

## 2021-04-12 DIAGNOSIS — R12 Heartburn: Secondary | ICD-10-CM | POA: Diagnosis not present

## 2021-04-12 DIAGNOSIS — R188 Other ascites: Secondary | ICD-10-CM | POA: Diagnosis not present

## 2021-04-12 DIAGNOSIS — C189 Malignant neoplasm of colon, unspecified: Secondary | ICD-10-CM

## 2021-04-12 DIAGNOSIS — D509 Iron deficiency anemia, unspecified: Secondary | ICD-10-CM | POA: Diagnosis not present

## 2021-04-12 DIAGNOSIS — Z5111 Encounter for antineoplastic chemotherapy: Secondary | ICD-10-CM | POA: Diagnosis not present

## 2021-04-12 DIAGNOSIS — C787 Secondary malignant neoplasm of liver and intrahepatic bile duct: Secondary | ICD-10-CM

## 2021-04-12 DIAGNOSIS — D6959 Other secondary thrombocytopenia: Secondary | ICD-10-CM | POA: Diagnosis not present

## 2021-04-12 DIAGNOSIS — G620 Drug-induced polyneuropathy: Secondary | ICD-10-CM | POA: Diagnosis not present

## 2021-04-12 DIAGNOSIS — Z452 Encounter for adjustment and management of vascular access device: Secondary | ICD-10-CM | POA: Diagnosis not present

## 2021-04-12 MED ORDER — HEPARIN SOD (PORK) LOCK FLUSH 100 UNIT/ML IV SOLN
500.0000 [IU] | Freq: Once | INTRAVENOUS | Status: AC | PRN
  Administered 2021-04-12: 500 [IU]

## 2021-04-12 MED ORDER — SODIUM CHLORIDE 0.9% FLUSH
10.0000 mL | INTRAVENOUS | Status: DC | PRN
  Administered 2021-04-12: 10 mL

## 2021-04-12 MED ORDER — SODIUM CHLORIDE 0.9 % IV SOLN: Freq: Once | INTRAVENOUS | Status: AC

## 2021-04-12 MED ORDER — PEGFILGRASTIM-CBQV 6 MG/0.6ML ~~LOC~~ SOSY
6.0000 mg | PREFILLED_SYRINGE | Freq: Once | SUBCUTANEOUS | Status: AC
  Administered 2021-04-12: 6 mg via SUBCUTANEOUS
  Filled 2021-04-12: qty 0.6

## 2021-04-12 NOTE — Progress Notes (Signed)
Pt reports today for pump dc. Reports still feeling dry. Vital signs obtained and CBG as ordered by Dr Benay Spice. Dr Benay Spice notified of vs, CBG and pt symptoms. Verbal orders obtained. ?

## 2021-04-12 NOTE — Patient Instructions (Signed)
Rehydration, Adult °Rehydration is the replacement of body fluids, salts, and minerals (electrolytes) that are lost during dehydration. Dehydration is when there is not enough water or other fluids in the body. This happens when you lose more fluids than you take in. Common causes of dehydration include: °Not drinking enough fluids. This can occur when you are ill or doing activities that require a lot of energy, especially in hot weather. °Conditions that cause loss of water or other fluids, such as diarrhea, vomiting, sweating, or urinating a lot. °Other illnesses, such as fever or infection. °Certain medicines, such as those that remove excess fluid from the body (diuretics). °Symptoms of mild or moderate dehydration may include thirst, dry lips and mouth, and dizziness. Symptoms of severe dehydration may include increased heart rate, confusion, fainting, and not urinating. °For severe dehydration, you may need to get fluids through an IV at the hospital. For mild or moderate dehydration, you can usually rehydrate at home by drinking certain fluids as told by your health care provider. °What are the risks? °Generally, rehydration is safe. However, taking in too much fluid (overhydration) can be a problem. This is rare. Overhydration can cause an electrolyte imbalance, kidney failure, or a decrease in salt (sodium) levels in the body. °Supplies needed °You will need an oral rehydration solution (ORS) if your health care provider tells you to use one. This is a drink to treat dehydration. It can be found in pharmacies and retail stores. °How to rehydrate °Fluids °Follow instructions from your health care provider for rehydration. The kind of fluid and the amount you should drink depend on your condition. In general, you should choose drinks that you prefer. °If told by your health care provider, drink an ORS. °Make an ORS by following instructions on the package. °Start by drinking small amounts, about ½ cup (120  mL) every 5-10 minutes. °Slowly increase how much you drink until you have taken the amount recommended by your health care provider. °Drink enough clear fluids to keep your urine pale yellow. If you were told to drink an ORS, finish it first, then start slowly drinking other clear fluids. Drink fluids such as: °Water. This includes sparkling water and flavored water. Drinking only water can lead to having too little sodium in your body (hyponatremia). Follow the advice of your health care provider. °Water from ice chips you suck on. °Fruit juice with water you add to it (diluted). °Sports drinks. °Hot or cold herbal teas. °Broth-based soups. °Milk or milk products. °Food °Follow instructions from your health care provider about what to eat while you rehydrate. Your health care provider may recommend that you slowly begin eating regular foods in small amounts. °Eat foods that contain a healthy balance of electrolytes, such as bananas, oranges, potatoes, tomatoes, and spinach. °Avoid foods that are greasy or contain a lot of sugar. °In some cases, you may get nutrition through a feeding tube that is passed through your nose and into your stomach (nasogastric tube, or NG tube). This may be done if you have uncontrolled vomiting or diarrhea. °Beverages to avoid °Certain beverages may make dehydration worse. While you rehydrate, avoid drinking alcohol. °How to tell if you are recovering from dehydration °You may be recovering from dehydration if: °You are urinating more often than before you started rehydrating. °Your urine is pale yellow. °Your energy level improves. °You vomit less frequently. °You have diarrhea less frequently. °Your appetite improves or returns to normal. °You feel less dizzy or less light-headed. °Your   skin tone and color start to look more normal. Follow these instructions at home: Take over-the-counter and prescription medicines only as told by your health care provider. Do not take sodium  tablets. Doing this can lead to having too much sodium in your body (hypernatremia). Contact a health care provider if: You continue to have symptoms of mild or moderate dehydration, such as: Thirst. Dry lips. Slightly dry mouth. Dizziness. Dark urine or less urine than normal. Muscle cramps. You continue to vomit or have diarrhea. Get help right away if you: Have symptoms of dehydration that get worse. Have a fever. Have a severe headache. Have been vomiting and the following happens: Your vomiting gets worse or does not go away. Your vomit includes blood or green matter (bile). You cannot eat or drink without vomiting. Have problems with urination or bowel movements, such as: Diarrhea that gets worse or does not go away. Blood in your stool (feces). This may cause stool to look black and tarry. Not urinating, or urinating only a small amount of very dark urine, within 6-8 hours. Have trouble breathing. Have symptoms that get worse with treatment. These symptoms may represent a serious problem that is an emergency. Do not wait to see if the symptoms will go away. Get medical help right away. Call your local emergency services (911 in the [place] Do not drive yourself to the hospital. Summary Rehydration is the replacement of body fluids and minerals (electrolytes) that are lost during dehydration. Follow instructions from your health care provider for rehydration. The kind of fluid and amount you should drink depend on your condition. Slowly increase how much you drink until you have taken the amount recommended by your health care provider. Contact your health care provider if you continue to show signs of mild or moderate dehydration. This information is not intended to replace advice given to you by your health care provider. Make sure you discuss any questions you have with your health care provider. Document Revised: 03/25/2019 Document Reviewed: 02/02/2019 Elsevier Patient  Education  2022 [place].  Pegfilgrastim Injection What is this medication? PEGFILGRASTIM (PEG fil gra stim) lowers the risk of infection in people who are receiving chemotherapy. It works by Building control surveyor make more white blood cells, which protects your body from infection. It may also be used to help people who have been exposed to high doses of radiation. This medicine may be used for other purposes; ask your health care provider or pharmacist if you have questions. COMMON BRAND NAME(S): Rexene Edison, Ziextenzo What should I tell my care team before I take this medication? They need to know if you have any of these conditions: Kidney disease Latex allergy Ongoing radiation therapy Sickle cell disease Skin reactions to acrylic adhesives (On-Body Injector only) An unusual or allergic reaction to pegfilgrastim, filgrastim, other medications, foods, dyes, or preservatives Pregnant or trying to get pregnant Breast-feeding How should I use this medication? This medication is for injection under the skin. If you get this medication at home, you will be taught how to prepare and give the pre-filled syringe or how to use the On-body Injector. Refer to the patient Instructions for Use for detailed instructions. Use exactly as directed. Tell your care team immediately if you suspect that the On-body Injector may not have performed as intended or if you suspect the use of the On-body Injector resulted in a missed or partial dose. It is important that you put your used needles and syringes in  a special sharps container. Do not put them in a trash can. If you do not have a sharps container, call your pharmacist or care team to get one. Talk to your care team about the use of this medication in children. While this medication may be prescribed for selected conditions, precautions do apply. Overdosage: If you think you have taken too much of this medicine contact a poison  control center or emergency room at once. NOTE: This medicine is only for you. Do not share this medicine with others. What if I miss a dose? It is important not to miss your dose. Call your care team if you miss your dose. If you miss a dose due to an On-body Injector failure or leakage, a new dose should be administered as soon as possible using a single prefilled syringe for manual use. What may interact with this medication? Interactions have not been studied. This list may not describe all possible interactions. Give your health care provider a list of all the medicines, herbs, non-prescription drugs, or dietary supplements you use. Also tell them if you smoke, drink alcohol, or use illegal drugs. Some items may interact with your medicine. What should I watch for while using this medication? Your condition will be monitored carefully while you are receiving this medication. You may need blood work done while you are taking this medication. Talk to your care team about your risk of cancer. You may be more at risk for certain types of cancer if you take this medication. If you are going to need a MRI, CT scan, or other procedure, tell your care team that you are using this medication (On-Body Injector only). What side effects may I notice from receiving this medication? Side effects that you should report to your care team as soon as possible: Allergic reactions--skin rash, itching, hives, swelling of the face, lips, tongue, or throat Capillary leak syndrome--stomach or muscle pain, unusual weakness or fatigue, feeling faint or lightheaded, decrease in the amount of urine, swelling of the ankles, hands, or feet, trouble breathing High white blood cell level--fever, fatigue, trouble breathing, night sweats, change in vision, weight loss Inflammation of the aorta--fever, fatigue, back, chest, or stomach pain, severe headache Kidney injury (glomerulonephritis)--decrease in the amount of urine, red  or dark Gebhard urine, foamy or bubbly urine, swelling of the ankles, hands, or feet Shortness of breath or trouble breathing Spleen injury--pain in upper left stomach or shoulder Unusual bruising or bleeding Side effects that usually do not require medical attention (report to your care team if they continue or are bothersome): Bone pain Pain in the hands or feet This list may not describe all possible side effects. Call your doctor for medical advice about side effects. You may report side effects to FDA at 1-800-FDA-1088. Where should I keep my medication? Keep out of the reach of children. If you are using this medication at home, you will be instructed on how to store it. Throw away any unused medication after the expiration date on the label. NOTE: This sheet is a summary. It may not cover all possible information. If you have questions about this medicine, talk to your doctor, pharmacist, or health care provider.  2022 Elsevier/Gold Standard (2020-10-11 00:00:00)

## 2021-04-12 NOTE — Telephone Encounter (Signed)
I spoke with [name] in the lab at [facility] regarding the blood culture report from yesterday with "bacteroides caccae" and also no growth < 24 hours.  She reports the "bacteroides caccae" is an error.  A new blood culture report will be issued. ?

## 2021-04-13 ENCOUNTER — Ambulatory Visit (HOSPITAL_BASED_OUTPATIENT_CLINIC_OR_DEPARTMENT_OTHER): Payer: BC Managed Care – PPO | Admitting: Family Medicine

## 2021-04-13 LAB — URINE CULTURE: Culture: 20000 — AB

## 2021-04-14 ENCOUNTER — Encounter: Payer: Self-pay | Admitting: Oncology

## 2021-04-14 ENCOUNTER — Other Ambulatory Visit: Payer: Self-pay

## 2021-04-14 ENCOUNTER — Inpatient Hospital Stay: Payer: BC Managed Care – PPO

## 2021-04-14 ENCOUNTER — Encounter: Payer: Self-pay | Admitting: Nurse Practitioner

## 2021-04-14 VITALS — BP 109/64 | HR 86 | Temp 99.4°F | Resp 18

## 2021-04-14 DIAGNOSIS — C7961 Secondary malignant neoplasm of right ovary: Secondary | ICD-10-CM | POA: Diagnosis not present

## 2021-04-14 DIAGNOSIS — D6959 Other secondary thrombocytopenia: Secondary | ICD-10-CM | POA: Diagnosis not present

## 2021-04-14 DIAGNOSIS — Z5111 Encounter for antineoplastic chemotherapy: Secondary | ICD-10-CM | POA: Diagnosis not present

## 2021-04-14 DIAGNOSIS — Z5189 Encounter for other specified aftercare: Secondary | ICD-10-CM | POA: Diagnosis not present

## 2021-04-14 DIAGNOSIS — Z95828 Presence of other vascular implants and grafts: Secondary | ICD-10-CM

## 2021-04-14 DIAGNOSIS — C787 Secondary malignant neoplasm of liver and intrahepatic bile duct: Secondary | ICD-10-CM | POA: Diagnosis not present

## 2021-04-14 DIAGNOSIS — C189 Malignant neoplasm of colon, unspecified: Secondary | ICD-10-CM

## 2021-04-14 DIAGNOSIS — D509 Iron deficiency anemia, unspecified: Secondary | ICD-10-CM | POA: Diagnosis not present

## 2021-04-14 DIAGNOSIS — R188 Other ascites: Secondary | ICD-10-CM | POA: Diagnosis not present

## 2021-04-14 DIAGNOSIS — Z452 Encounter for adjustment and management of vascular access device: Secondary | ICD-10-CM | POA: Diagnosis not present

## 2021-04-14 DIAGNOSIS — C182 Malignant neoplasm of ascending colon: Secondary | ICD-10-CM

## 2021-04-14 DIAGNOSIS — G620 Drug-induced polyneuropathy: Secondary | ICD-10-CM | POA: Diagnosis not present

## 2021-04-14 DIAGNOSIS — R12 Heartburn: Secondary | ICD-10-CM | POA: Diagnosis not present

## 2021-04-14 MED ORDER — HEPARIN SOD (PORK) LOCK FLUSH 100 UNIT/ML IV SOLN
500.0000 [IU] | Freq: Once | INTRAVENOUS | Status: AC
  Administered 2021-04-14: 500 [IU]

## 2021-04-14 MED ORDER — SODIUM CHLORIDE 0.9 % IV SOLN: Freq: Once | INTRAVENOUS | Status: AC

## 2021-04-14 MED ORDER — SODIUM CHLORIDE 0.9% FLUSH
10.0000 mL | Freq: Once | INTRAVENOUS | Status: AC
  Administered 2021-04-14: 10 mL

## 2021-04-14 NOTE — Telephone Encounter (Signed)
TC from Pt stating she is feeling dehydrated Pt stated she has been drinking and urinating had some diarrhea but took imodium and it subsided. Pt scheduled to come in for fluids. ?

## 2021-04-14 NOTE — Patient Instructions (Signed)

## 2021-04-14 NOTE — Telephone Encounter (Signed)
Chart reviewed.

## 2021-04-15 ENCOUNTER — Other Ambulatory Visit: Payer: Self-pay

## 2021-04-15 ENCOUNTER — Encounter: Payer: Self-pay | Admitting: Oncology

## 2021-04-15 MED ORDER — MAGIC MOUTHWASH: 5.0000 mL | Freq: Four times a day (QID) | ORAL | 0 refills | Status: DC

## 2021-04-16 LAB — CULTURE, BLOOD (SINGLE)
Culture: NO GROWTH
Special Requests: ADEQUATE

## 2021-04-21 ENCOUNTER — Other Ambulatory Visit: Payer: Self-pay | Admitting: Oncology

## 2021-04-24 ENCOUNTER — Encounter: Payer: Self-pay | Admitting: *Deleted

## 2021-04-24 ENCOUNTER — Inpatient Hospital Stay: Payer: BC Managed Care – PPO

## 2021-04-24 ENCOUNTER — Inpatient Hospital Stay (HOSPITAL_BASED_OUTPATIENT_CLINIC_OR_DEPARTMENT_OTHER): Payer: BC Managed Care – PPO | Admitting: Oncology

## 2021-04-24 ENCOUNTER — Other Ambulatory Visit: Payer: Self-pay | Admitting: Oncology

## 2021-04-24 ENCOUNTER — Other Ambulatory Visit: Payer: Self-pay

## 2021-04-24 ENCOUNTER — Emergency Department (HOSPITAL_COMMUNITY): Payer: BC Managed Care – PPO

## 2021-04-24 ENCOUNTER — Encounter (HOSPITAL_COMMUNITY): Payer: Self-pay

## 2021-04-24 ENCOUNTER — Inpatient Hospital Stay (HOSPITAL_COMMUNITY)
Admission: EM | Admit: 2021-04-24 | Discharge: 2021-04-28 | DRG: 864 | Disposition: A | Discharge status: Home / Self Care | Payer: BC Managed Care – PPO | Attending: Internal Medicine | Admitting: Internal Medicine

## 2021-04-24 VITALS — BP 137/79 | HR 93 | Temp 97.9°F | Resp 18 | Ht 66.0 in | Wt 153.8 lb

## 2021-04-24 DIAGNOSIS — C189 Malignant neoplasm of colon, unspecified: Secondary | ICD-10-CM | POA: Diagnosis not present

## 2021-04-24 DIAGNOSIS — C7801 Secondary malignant neoplasm of right lung: Secondary | ICD-10-CM | POA: Diagnosis present

## 2021-04-24 DIAGNOSIS — D509 Iron deficiency anemia, unspecified: Secondary | ICD-10-CM | POA: Diagnosis present

## 2021-04-24 DIAGNOSIS — E119 Type 2 diabetes mellitus without complications: Secondary | ICD-10-CM

## 2021-04-24 DIAGNOSIS — A419 Sepsis, unspecified organism: Secondary | ICD-10-CM | POA: Diagnosis present

## 2021-04-24 DIAGNOSIS — D6481 Anemia due to antineoplastic chemotherapy: Secondary | ICD-10-CM | POA: Diagnosis not present

## 2021-04-24 DIAGNOSIS — C787 Secondary malignant neoplasm of liver and intrahepatic bile duct: Secondary | ICD-10-CM

## 2021-04-24 DIAGNOSIS — Z823 Family history of stroke: Secondary | ICD-10-CM | POA: Diagnosis not present

## 2021-04-24 DIAGNOSIS — R569 Unspecified convulsions: Secondary | ICD-10-CM | POA: Diagnosis not present

## 2021-04-24 DIAGNOSIS — Z888 Allergy status to other drugs, medicaments and biological substances status: Secondary | ICD-10-CM

## 2021-04-24 DIAGNOSIS — Z801 Family history of malignant neoplasm of trachea, bronchus and lung: Secondary | ICD-10-CM

## 2021-04-24 DIAGNOSIS — Z79899 Other long term (current) drug therapy: Secondary | ICD-10-CM

## 2021-04-24 DIAGNOSIS — F419 Anxiety disorder, unspecified: Secondary | ICD-10-CM | POA: Diagnosis not present

## 2021-04-24 DIAGNOSIS — C182 Malignant neoplasm of ascending colon: Secondary | ICD-10-CM

## 2021-04-24 DIAGNOSIS — Z8249 Family history of ischemic heart disease and other diseases of the circulatory system: Secondary | ICD-10-CM

## 2021-04-24 DIAGNOSIS — C7802 Secondary malignant neoplasm of left lung: Secondary | ICD-10-CM | POA: Diagnosis present

## 2021-04-24 DIAGNOSIS — Z8543 Personal history of malignant neoplasm of ovary: Secondary | ICD-10-CM

## 2021-04-24 DIAGNOSIS — R0682 Tachypnea, not elsewhere classified: Secondary | ICD-10-CM | POA: Diagnosis present

## 2021-04-24 DIAGNOSIS — E872 Acidosis, unspecified: Secondary | ICD-10-CM | POA: Diagnosis not present

## 2021-04-24 DIAGNOSIS — Z880 Allergy status to penicillin: Secondary | ICD-10-CM

## 2021-04-24 DIAGNOSIS — R502 Drug induced fever: Principal | ICD-10-CM | POA: Diagnosis present

## 2021-04-24 DIAGNOSIS — E1165 Type 2 diabetes mellitus with hyperglycemia: Secondary | ICD-10-CM | POA: Diagnosis present

## 2021-04-24 DIAGNOSIS — F32A Depression, unspecified: Secondary | ICD-10-CM | POA: Diagnosis not present

## 2021-04-24 DIAGNOSIS — Z803 Family history of malignant neoplasm of breast: Secondary | ICD-10-CM

## 2021-04-24 DIAGNOSIS — Z5189 Encounter for other specified aftercare: Secondary | ICD-10-CM | POA: Diagnosis not present

## 2021-04-24 DIAGNOSIS — F341 Dysthymic disorder: Secondary | ICD-10-CM | POA: Diagnosis present

## 2021-04-24 DIAGNOSIS — T451X5A Adverse effect of antineoplastic and immunosuppressive drugs, initial encounter: Secondary | ICD-10-CM | POA: Diagnosis not present

## 2021-04-24 DIAGNOSIS — Z85038 Personal history of other malignant neoplasm of large intestine: Secondary | ICD-10-CM

## 2021-04-24 DIAGNOSIS — R109 Unspecified abdominal pain: Secondary | ICD-10-CM | POA: Diagnosis not present

## 2021-04-24 DIAGNOSIS — R000 Tachycardia, unspecified: Secondary | ICD-10-CM | POA: Diagnosis present

## 2021-04-24 DIAGNOSIS — Z885 Allergy status to narcotic agent status: Secondary | ICD-10-CM

## 2021-04-24 DIAGNOSIS — Z28310 Unvaccinated for covid-19: Secondary | ICD-10-CM | POA: Diagnosis not present

## 2021-04-24 DIAGNOSIS — R6520 Severe sepsis without septic shock: Secondary | ICD-10-CM | POA: Diagnosis not present

## 2021-04-24 DIAGNOSIS — Z800 Family history of malignant neoplasm of digestive organs: Secondary | ICD-10-CM

## 2021-04-24 DIAGNOSIS — Z91012 Allergy to eggs: Secondary | ICD-10-CM

## 2021-04-24 DIAGNOSIS — K219 Gastro-esophageal reflux disease without esophagitis: Secondary | ICD-10-CM | POA: Diagnosis present

## 2021-04-24 DIAGNOSIS — Z9049 Acquired absence of other specified parts of digestive tract: Secondary | ICD-10-CM

## 2021-04-24 DIAGNOSIS — D84821 Immunodeficiency due to drugs: Secondary | ICD-10-CM | POA: Diagnosis not present

## 2021-04-24 DIAGNOSIS — R188 Other ascites: Secondary | ICD-10-CM | POA: Diagnosis not present

## 2021-04-24 DIAGNOSIS — D696 Thrombocytopenia, unspecified: Secondary | ICD-10-CM | POA: Diagnosis present

## 2021-04-24 DIAGNOSIS — Z833 Family history of diabetes mellitus: Secondary | ICD-10-CM

## 2021-04-24 DIAGNOSIS — Z20822 Contact with and (suspected) exposure to covid-19: Secondary | ICD-10-CM | POA: Diagnosis not present

## 2021-04-24 DIAGNOSIS — R509 Fever, unspecified: Secondary | ICD-10-CM | POA: Diagnosis not present

## 2021-04-24 DIAGNOSIS — Z882 Allergy status to sulfonamides status: Secondary | ICD-10-CM

## 2021-04-24 DIAGNOSIS — Z8371 Family history of colonic polyps: Secondary | ICD-10-CM

## 2021-04-24 DIAGNOSIS — R110 Nausea: Secondary | ICD-10-CM | POA: Diagnosis not present

## 2021-04-24 DIAGNOSIS — Z7984 Long term (current) use of oral hypoglycemic drugs: Secondary | ICD-10-CM

## 2021-04-24 LAB — CMP (CANCER CENTER ONLY)
ALT: 22 U/L (ref 0–44)
AST: 33 U/L (ref 15–41)
Albumin: 3.7 g/dL (ref 3.5–5.0)
Alkaline Phosphatase: 173 U/L — ABNORMAL HIGH (ref 38–126)
Anion gap: 10 (ref 5–15)
BUN: 10 mg/dL (ref 6–20)
CO2: 24 mmol/L (ref 22–32)
Calcium: 9.7 mg/dL (ref 8.9–10.3)
Chloride: 102 mmol/L (ref 98–111)
Creatinine: 0.5 mg/dL (ref 0.44–1.00)
GFR, Estimated: >60 mL/min (ref >60)
Glucose, Bld: 362 mg/dL — ABNORMAL HIGH (ref 70–99)
Potassium: 3.9 mmol/L (ref 3.5–5.1)
Sodium: 136 mmol/L (ref 135–145)
Total Bilirubin: 1.2 mg/dL (ref 0.3–1.2)
Total Protein: 7.3 g/dL (ref 6.5–8.1)

## 2021-04-24 LAB — CBC WITH DIFFERENTIAL/PLATELET
Abs Immature Granulocytes: 0.61 10*3/uL — ABNORMAL HIGH (ref 0.00–0.07)
Basophils Absolute: 0.1 10*3/uL (ref 0.0–0.1)
Basophils Relative: 1 %
Eosinophils Absolute: 0 10*3/uL (ref 0.0–0.5)
Eosinophils Relative: 0 %
HCT: 35 % — ABNORMAL LOW (ref 36.0–46.0)
Hemoglobin: 10.7 g/dL — ABNORMAL LOW (ref 12.0–15.0)
Immature Granulocytes: 8 %
Lymphocytes Relative: 1 %
Lymphs Abs: 0.1 10*3/uL — ABNORMAL LOW (ref 0.7–4.0)
MCH: 24.4 pg — ABNORMAL LOW (ref 26.0–34.0)
MCHC: 30.6 g/dL (ref 30.0–36.0)
MCV: 79.9 fL — ABNORMAL LOW (ref 80.0–100.0)
Monocytes Absolute: 0.2 10*3/uL (ref 0.1–1.0)
Monocytes Relative: 3 %
Neutro Abs: 6.8 10*3/uL (ref 1.7–7.7)
Neutrophils Relative %: 87 %
Platelets: 86 10*3/uL — ABNORMAL LOW (ref 150–400)
RBC: 4.38 MIL/uL (ref 3.87–5.11)
RDW: 18.7 % — ABNORMAL HIGH (ref 11.5–15.5)
WBC: 7.8 10*3/uL (ref 4.0–10.5)
nRBC: 0 % (ref 0.0–0.2)

## 2021-04-24 LAB — COMPREHENSIVE METABOLIC PANEL
ALT: 42 U/L (ref 0–44)
AST: 79 U/L — ABNORMAL HIGH (ref 15–41)
Albumin: 3.6 g/dL (ref 3.5–5.0)
Alkaline Phosphatase: 187 U/L — ABNORMAL HIGH (ref 38–126)
Anion gap: 12 (ref 5–15)
BUN: 11 mg/dL (ref 6–20)
CO2: 20 mmol/L — ABNORMAL LOW (ref 22–32)
Calcium: 9.3 mg/dL (ref 8.9–10.3)
Chloride: 101 mmol/L (ref 98–111)
Creatinine, Ser: 0.73 mg/dL (ref 0.44–1.00)
GFR, Estimated: >60 mL/min (ref >60)
Glucose, Bld: 461 mg/dL — ABNORMAL HIGH (ref 70–99)
Potassium: 4.1 mmol/L (ref 3.5–5.1)
Sodium: 133 mmol/L — ABNORMAL LOW (ref 135–145)
Total Bilirubin: 1.4 mg/dL — ABNORMAL HIGH (ref 0.3–1.2)
Total Protein: 7.8 g/dL (ref 6.5–8.1)

## 2021-04-24 LAB — CBC WITH DIFFERENTIAL (CANCER CENTER ONLY)
Abs Immature Granulocytes: 0.46 10*3/uL — ABNORMAL HIGH (ref 0.00–0.07)
Basophils Absolute: 0 10*3/uL (ref 0.0–0.1)
Basophils Relative: 1 %
Eosinophils Absolute: 0 10*3/uL (ref 0.0–0.5)
Eosinophils Relative: 1 %
HCT: 34.1 % — ABNORMAL LOW (ref 36.0–46.0)
Hemoglobin: 10.3 g/dL — ABNORMAL LOW (ref 12.0–15.0)
Immature Granulocytes: 10 %
Lymphocytes Relative: 12 %
Lymphs Abs: 0.5 10*3/uL — ABNORMAL LOW (ref 0.7–4.0)
MCH: 23.5 pg — ABNORMAL LOW (ref 26.0–34.0)
MCHC: 30.2 g/dL (ref 30.0–36.0)
MCV: 77.7 fL — ABNORMAL LOW (ref 80.0–100.0)
Monocytes Absolute: 0.3 10*3/uL (ref 0.1–1.0)
Monocytes Relative: 7 %
Neutro Abs: 3.2 10*3/uL (ref 1.7–7.7)
Neutrophils Relative %: 69 %
Platelet Count: 90 10*3/uL — ABNORMAL LOW (ref 150–400)
RBC: 4.39 MIL/uL (ref 3.87–5.11)
RDW: 18.9 % — ABNORMAL HIGH (ref 11.5–15.5)
WBC Count: 4.6 10*3/uL (ref 4.0–10.5)
nRBC: 0 % (ref 0.0–0.2)

## 2021-04-24 LAB — URINALYSIS, ROUTINE W REFLEX MICROSCOPIC
Bilirubin Urine: NEGATIVE
Glucose, UA: >=500 mg/dL — AB
Ketones, ur: 5 mg/dL — AB
Leukocytes,Ua: NEGATIVE
Nitrite: NEGATIVE
Protein, ur: NEGATIVE mg/dL
Specific Gravity, Urine: 1.033 — ABNORMAL HIGH (ref 1.005–1.030)
pH: 6 (ref 5.0–8.0)

## 2021-04-24 LAB — PROTIME-INR
INR: 1.5 — ABNORMAL HIGH (ref 0.8–1.2)
Prothrombin Time: 17.7 seconds — ABNORMAL HIGH (ref 11.4–15.2)

## 2021-04-24 LAB — LACTIC ACID, PLASMA: Lactic Acid, Venous: 3.6 mmol/L (ref 0.5–1.9)

## 2021-04-24 LAB — CEA (ACCESS): CEA (CHCC): 155.12 ng/mL — ABNORMAL HIGH (ref 0.00–5.00)

## 2021-04-24 MED ORDER — PANTOPRAZOLE SODIUM 40 MG PO TBEC: 40.0000 mg | DELAYED_RELEASE_TABLET | Freq: Every day | ORAL | 1 refills | Status: DC

## 2021-04-24 MED ORDER — SODIUM CHLORIDE 0.9 % IV BOLUS
1000.0000 mL | Freq: Once | INTRAVENOUS | Status: AC
  Administered 2021-04-24: 1000 mL via INTRAVENOUS

## 2021-04-24 MED ORDER — LEUCOVORIN CALCIUM INJECTION 350 MG
400.0000 mg/m2 | Freq: Once | INTRAVENOUS | Status: AC
  Administered 2021-04-24: 728 mg via INTRAVENOUS
  Filled 2021-04-24: qty 36.4

## 2021-04-24 MED ORDER — SODIUM CHLORIDE 0.9 % IV SOLN
2400.0000 mg/m2 | INTRAVENOUS | Status: DC
  Administered 2021-04-24: 4350 mg via INTRAVENOUS
  Filled 2021-04-24: qty 87

## 2021-04-24 MED ORDER — PALONOSETRON HCL INJECTION 0.25 MG/5ML
0.2500 mg | Freq: Once | INTRAVENOUS | Status: AC
  Administered 2021-04-24: 0.25 mg via INTRAVENOUS
  Filled 2021-04-24: qty 5

## 2021-04-24 MED ORDER — SODIUM CHLORIDE 0.9 % IV SOLN
10.0000 mg | Freq: Once | INTRAVENOUS | Status: AC
  Administered 2021-04-24: 10 mg via INTRAVENOUS
  Filled 2021-04-24: qty 1

## 2021-04-24 MED ORDER — DIPHENOXYLATE-ATROPINE 2.5-0.025 MG PO TABS: ORAL_TABLET | ORAL | 1 refills | Status: DC

## 2021-04-24 MED ORDER — FLUOROURACIL CHEMO INJECTION 2.5 GM/50ML
400.0000 mg/m2 | Freq: Once | INTRAVENOUS | Status: AC
  Administered 2021-04-24: 750 mg via INTRAVENOUS
  Filled 2021-04-24: qty 15

## 2021-04-24 MED ORDER — DEXTROSE 5 % IV SOLN: Freq: Once | INTRAVENOUS | Status: AC

## 2021-04-24 MED ORDER — OXALIPLATIN CHEMO INJECTION 100 MG/20ML
150.0000 mg | Freq: Once | INTRAVENOUS | Status: AC
  Administered 2021-04-24: 150 mg via INTRAVENOUS
  Filled 2021-04-24: qty 10

## 2021-04-24 MED ORDER — ACETAMINOPHEN 500 MG PO TABS
1000.0000 mg | ORAL_TABLET | Freq: Once | ORAL | Status: AC
  Administered 2021-04-24: 1000 mg via ORAL
  Filled 2021-04-24: qty 2

## 2021-04-24 NOTE — Progress Notes (Signed)
Patient presents for treatment. RN assessment completed along with the following: ? ?Labs/vitals reviewed - Yes, and Per Dr. Benay Spice, ok to treat with platelets 90, blood glucose 362.     ?Weight within 10% of previous measurement - Yes ?Informed consent completed and reflects current therapy/intent - Yes, on date 04/10/21             ?Provider progress note reviewed -Provider note not yet available.  Last office visit note reviewed from 04/11/21. ?Treatment/Antibody/Supportive plan reviewed - Yes, and there are no adjustments needed for today's treatment. ?S&H and other orders reviewed - Yes, and there are no additional orders identified. ?Previous treatment date reviewed - Yes, and the appropriate amount of time has elapsed between treatments. ?Clinic Hand Off Received from - Merceda Elks, RN ? ?Patient to proceed with treatment.   ?

## 2021-04-24 NOTE — Progress Notes (Signed)
Patient seen by Dr. Benay Spice today ? ?Vitals are within treatment parameters. ? ?Labs reviewed by Dr. Benay Spice and are not all within treatment parameters. Platelet 90,000--OK to treat. Glucose 362--OK to treat. No intervention needed in office. ? ?Per physician team, patient is ready for treatment and there are NO modifications to the treatment plan.  ?

## 2021-04-24 NOTE — ED Notes (Signed)
Called x2 to Dr Delton Coombes. No answer. Left voice message to call Dr Gilford Raid @ [phone] ?

## 2021-04-24 NOTE — ED Notes (Signed)
Pt ambulated to restroom. 

## 2021-04-24 NOTE — Patient Instructions (Signed)

## 2021-04-24 NOTE — ED Triage Notes (Signed)
Ambulatory to ED with c/o dizziness, nausea, vomiting. States she had chemo today and then all these symptoms started.  ?

## 2021-04-24 NOTE — Progress Notes (Signed)
?[place] ?OFFICE PROGRESS NOTE ? ? ?Diagnosis: Colon cancer ? ?INTERVAL HISTORY:  ? ?[name] returns as scheduled.  She completed a course of antibiotics for the urinary tract infection.  No further fever.  She feels "bloated "after eating.  She has developed "hemorrhoids ".  Her husband's hemorrhoid cream helped. ?She is constipated.  No cold sensitivity or neuropathy symptoms.  She complains of heartburn unrelieved by over-the-counter Pepcid and Prilosec. ? ?Objective: ? ?Vital signs in last 24 hours: ? ?Blood pressure 137/79, pulse 93, temperature 97.9 ?F (36.6 ?C), temperature source Oral, resp. rate 18, height 5' 6"  (1.676 m), weight 153 lb 12.8 oz (69.8 kg), SpO2 100 %. ?  ? ?HEENT: No thrush or ulcers ?Resp: Lungs clear bilaterally ?Cardio: Regular rate and rhythm ?GI: Mildly distended, mild diffuse tenderness, firmness in the mid and left upper abdomen without a discrete mass ?Vascular: No leg edema ?  ? ?Portacath/PICC-without erythema ? ?Lab Results: ? ?Lab Results  ?Component Value Date  ? WBC 4.6 04/24/2021  ? HGB 10.3 (L) 04/24/2021  ? HCT 34.1 (L) 04/24/2021  ? MCV 77.7 (L) 04/24/2021  ? PLT 90 (L) 04/24/2021  ? NEUTROABS 3.2 04/24/2021  ? ? ?CMP  ?Lab Results  ?Component Value Date  ? NA 136 04/24/2021  ? K 3.9 04/24/2021  ? CL 102 04/24/2021  ? CO2 24 04/24/2021  ? GLUCOSE 362 (H) 04/24/2021  ? BUN 10 04/24/2021  ? CREATININE 0.50 04/24/2021  ? CALCIUM 9.7 04/24/2021  ? PROT 7.3 04/24/2021  ? ALBUMIN 3.7 04/24/2021  ? AST 33 04/24/2021  ? ALT 22 04/24/2021  ? ALKPHOS 173 (H) 04/24/2021  ? BILITOT 1.2 04/24/2021  ? GFRNONAA >60 04/24/2021  ? GFRAA >60 11/05/2019  ? ? ?Lab Results  ?Component Value Date  ? CEA1 43.43 (H) 06/30/2020  ? CEA 155.12 (H) 04/24/2021  ? CA125 14 11/26/2014  ? ? ?Medications: I have reviewed the patient's current medications. ? ? 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 ?The tumor returned microsatellite stable with no loss of mismatch repair protein expression   ?APC mutated. No BRAF, KRAS, or NRAS mutation On [facility] 1 testing   ?Cycle 1 adjuvant FOLFOX 09/08/2013   ?Cycle 2 adjuvant FOLFOX 09/24/2013   ?Cycle 3 adjuvant FOLFOX 10/08/2013.   ?Cycle 4 adjuvant FOLFOX 10/22/2013.   ?Cycle 5 adjuvant FOLFOX 11/05/2013. Oxaliplatin held due to thrombocytopenia. ?Cycle 6 FOLFOX 11/19/2013. ?Cycle 7 FOLFOX 12/03/2013. Oxaliplatin held secondary to thrombocytopenia. ?Cycle 8 FOLFOX 12/17/2013. ?Cycle 9 FOLFOX 01/04/2014. Oxaliplatin held secondary to neutropenia.   ?Cycle 10 FOLFOX 01/21/2014. Oxaliplatin held secondary to thrombocytopenia. ?Cycle 11 FOLFOX 02/04/2014 ?Cycle 12 FOLFOX 02/18/2014, oxaliplatin dose reduced secondary tothrombocytopenia ?CT abdomen/pelvis 01/30/2014 revealed splenomegaly and no evidence of recurrent colon cancer ?CT chest 04/07/2014 with a stable right lower lobe nodule and no evidence for metastatic disease, no nodules seen on the CT 11/26/2014 ?Markedly elevated CEA 11/24/2014 ?CT 11/26/2014 revealed a right pelvic mass, splenomegaly, small volume ascites ?Right salpingo-oophorectomy 12/28/2014 with the pathology confirming metastatic colon cancer ?CTs 03/23/2016-no evidence of recurrent or metastatic disease. ?CT 11/26/2016-enlargement of a fluid density structure the right pelvic sidewall, no other evidence of metastatic disease ?CT aspiration right pelvic cyst 12/19/2016.  Cytology-BENIGN REACTIVE/REPARATIVE CHANGES. ?CTs 06/05/2017- no evidence of metastatic disease, mild cirrhotic changes with splenomegaly ?CTs 12/11/2017- recurrent cystic right adnexal mass, similar to on the CT 11/26/2016, stable mild splenomegaly ?CTs 04/23/2018-  enlargement of cystic right adnexal mass with mural nodularity, no other evidence of metastatic disease ?Cytoreductive surgery/HIPEC with mitomycin by Dr. Clovis Riley at [facility]  08/12/2018-R1 resection achieved.  Cytoreduction included omentectomy, LAR, right salpingo-oophorectomy and left colonic gutter/pelvic stripping.  Pathology on the rectum showed recurrent/metastatic adenocarcinoma, tumor 2.0 cm, predominantly involving the subserosa and muscularis propria of the colon, proximal and distal margins of resection were negative, vascular invasion present, metastatic carcinoma present in 1 out of 5 lymph nodes; omentum resection with no malignancy seen, no metastatic carcinoma identified in 1 lymph node examined; left gutter stripping positive metastatic adenocarcinoma; right ovary resection positive metastatic adenocarcinoma. 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 ?CEA 68 02/03/2019 ?Biopsy liver lesion 02/11/2019-metastatic adenocarcinoma consistent with primary colonic adenocarcinoma ?CTs 02/27/2019-multiple liver lesions increased in size, new lesion in the lateral right lobe of the liver.  No evidence of metastatic disease in the chest.  Redemonstrated moderate left hydronephrosis and proximal hydroureter without discrete lesion or obstructing etiology at the transition point of the mid ureter. ?Cycle 1 FOLFIRI/Panitumumab 03/12/2019 ?Cycle 2 FOLFIRI/Panitumumab 03/26/2019-bolus 5-FU and irinotecan held secondary to neutropenia ?Cycle 3 FOLFIRI/Panitumumab 04/09/2019, Udenyca added-not given secondary to seizure/discontinuation of the 5-FU pump ?CT abdomen/pelvis at [facility] 05/04/2019-no residual fluid collection at the left abdominal wall abscess, stable moderate left hydronephrosis, multifocal indeterminate liver lesions--liver lesions significantly improved ?Cycle 4 FOLFIRI/Panitumumab 05/07/2019, Udenyca ?Cycle 5 FOLFIRI/Panitumumab 05/20/2019, Udenyca ?Cycle 6 FOLFIRI/Panitumumab 06/18/2019, Udenyca ?Cycle 7 FOLFIRI/Panitumumab 07/01/2019 (Irinotecan dose reduced due to thrombocytopenia), Udenyca--Udenyca was not  given ?Cycle 8 FOLFIRI/Panitumumab 07/15/2019, Udenyca ?Cycle 9 FOLFIRI/Panitumumab 07/29/2019, Udenyca ?Cycle 10 FOLFIRI/Panitumumab 08/13/2019, Udenyca ?CTs at [facility] 08/19/2019-decreased size of the hypodense and hyperdense lesions within segment 2 of the left hepatic lobe.  No new lesions identified.  Similar presacral soft tissue thickening with adjacent stable alignment in the rectum.  Improved but persistent left UPJ obstruction with mild left hydronephrosis.  Persistent but decreased size of the wound within the left mid abdomen abdominal wall. ?Cycle 11 irinotecan/Panitumumab 08/27/2019, Udenyca ?Cycle 12 irinotecan/Panitumumab 10/08/2019, Udenyca ?Cycle 13 irinotecan/Panitumumab 11/05/2019, Udenyca ?CT abdomen/pelvis 11/19/2019-no new or progressive interval findings.  Stable appearance of the calcified lesion in the anterior left hepatic dome with second tiny low-density lesion in the dome of the lateral segment left liver.  Both lesions are markedly decreased since 02/27/2019.  Stable mild fullness left intrarenal collecting system and renal pelvis.  Similar appearance of abnormal soft tissue in the left paramidline subcutaneous fat with associated skin thickening, this likely correlates to the fistula. ?Cycle 14 irinotecan/Panitumumab 11/19/2019, Udenyca ?Cycle 15 irinotecan/Panitumumab 12/02/2019, Udenyca ?CT abdomen/pelvis at [facility] 02/11/2020- similar to slight decrease in segment 2 hypodense and hyperdense lesions (compared to a CT from July 2020), no new lesions, splenomegaly, no fluid collection or abscess, mild stranding adjacent to the incision with overlying skin thickening ?Cycle 16 irinotecan/Panitumumab 03/03/2020, Udenyca ?Cycle 17 irinotecan/Panitumumab 03/17/2020, Udenyca ?Cycle 18 irinotecan/panitumumab 04/07/2020, Udenyca ?Cycle 19 irinotecan/Panitumumab 04/28/2020, Udenyca ?Cycle 20 irinotecan/panitumumab 05/26/2020, Udenyca ?Cycle 21 irinotecan/panitumumab 06/30/2020, Udenyca ?CT abdomen/pelvis  07/19/2020-mildly increased hepatic steatosis.  Increased size of 2.8 cm hypervascular lesion segment 2 left hepatic lobe. ?Cycle 22 irinotecan/Panitumumab 07/21/2020, Udenyca ?MRI Abd 07/28/2020: liver lesions consis

## 2021-04-24 NOTE — Patient Instructions (Addendum)
[facility]   ?The chemotherapy medication bag should finish at 46 hours, 96 hours, or 7 days. For example, if your pump is scheduled for 46 hours and it was put on at 4:00 p.m., it should finish at 2:00 p.m. the day it is scheduled to come off regardless of your appointment time.   ?  ?Estimated time to finish at 11:30am Wednesday, April 26, 2021. ?  ?If the display on your pump reads "Low Volume" and it is beeping, take the batteries out of the pump and come to the cancer center for it to be taken off.  ? ?If the pump alarms go off prior to the pump reading "Low Volume" then call [phone] and someone can assist you. ? ?If the plunger comes out and the chemotherapy medication is leaking out, please use your home chemo spill kit to clean up the spill. Do NOT use paper towels or other household products. ? ?If you have problems or questions regarding your pump, please call either [phone] (24 hours a day) or the cancer center Monday-Friday 8:00 a.m.- 4:30 p.m. at the clinic number and we will assist you. If you are unable to get assistance, then go to the nearest Emergency Department and ask the staff to contact the IV team for assistance.  ?Discharge Instructions: ?Thank you for choosing [facility] to provide your oncology and hematology care.  ? ?If you have a lab appointment with the [facility], please go directly to the [facility] and check in at the registration area. ?  ?Wear comfortable clothing and clothing appropriate for easy access to any Portacath or PICC line.  ? ?We strive to give you quality time with your provider. You may need to reschedule your appointment if you arrive late (15 or more minutes).  Arriving late affects you and other patients whose appointments are after yours.  Also, if you miss three or more appointments without notifying the office, you may be dismissed from the clinic at the provider?s discretion.    ?  ?For  prescription refill requests, have your pharmacy contact our office and allow 72 hours for refills to be completed.   ? ?Today you received the following chemotherapy and/or immunotherapy agents Oxaliplatin, leucovorin, Fluorouracil    ?  ?To help prevent nausea and vomiting after your treatment, we encourage you to take your nausea medication as directed. ? ?BELOW ARE SYMPTOMS THAT SHOULD BE REPORTED IMMEDIATELY: ?*FEVER GREATER THAN 100.4 F (38 ?C) OR HIGHER ?*CHILLS OR SWEATING ?*NAUSEA AND VOMITING THAT IS NOT CONTROLLED WITH YOUR NAUSEA MEDICATION ?*UNUSUAL SHORTNESS OF BREATH ?*UNUSUAL BRUISING OR BLEEDING ?*URINARY PROBLEMS (pain or burning when urinating, or frequent urination) ?*BOWEL PROBLEMS (unusual diarrhea, constipation, pain near the anus) ?TENDERNESS IN MOUTH AND THROAT WITH OR WITHOUT PRESENCE OF ULCERS (sore throat, sores in mouth, or a toothache) ?UNUSUAL RASH, SWELLING OR PAIN  ?UNUSUAL VAGINAL DISCHARGE OR ITCHING  ? ?Items with * indicate a potential emergency and should be followed up as soon as possible or go to the Emergency Department if any problems should occur. ? ?Please show the CHEMOTHERAPY ALERT CARD or IMMUNOTHERAPY ALERT CARD at check-in to the Emergency Department and triage nurse. ? ?Should you have questions after your visit or need to cancel or reschedule your appointment, please contact [facility]  Dept: [phone]  and follow the prompts.  Office hours are 8:00 a.m. to 4:30 p.m. Monday - Friday. Please note that voicemails  left after 4:00 p.m. may not be returned until the following business day.  We are closed weekends and major holidays. You have access to a nurse at all times for urgent questions. Please call the main number to the clinic Dept: [phone] and follow the prompts. ? ? ?For any non-urgent questions, you may also contact your provider using MyChart. We now offer e-Visits for anyone 85 and older to request care online for  non-urgent symptoms. For details visit [email or web address]. ?  ?Also download the MyChart app! Go to the app store, search "MyChart", open the app, select [facility], and log in with your MyChart username and password. ? ?Due to Covid, a mask is required upon entering the hospital/clinic. If you do not have a mask, one will be given to you upon arrival. For doctor visits, patients may have 1 support person aged 18 or older with them. For treatment visits, patients cannot have anyone with them due to current Covid guidelines and our immunocompromised population.  ?Oxaliplatin Injection ?What is this medication? ?OXALIPLATIN (ox AL i PLA tin) is a chemotherapy drug. It targets fast dividing cells, like cancer cells, and causes these cells to die. This medicine is used to treat cancers of the colon and rectum, and many other cancers. ?This medicine may be used for other purposes; ask your health care provider or pharmacist if you have questions. ?COMMON BRAND NAME(S): Eloxatin ?What should I tell my care team before I take this medication? ?They need to know if you have any of these conditions: ?heart disease ?history of irregular heartbeat ?liver disease ?low blood counts, like white cells, platelets, or red blood cells ?lung or breathing disease, like asthma ?take medicines that treat or prevent blood clots ?tingling of the fingers or toes, or other nerve disorder ?an unusual or allergic reaction to oxaliplatin, other chemotherapy, other medicines, foods, dyes, or preservatives ?pregnant or trying to get pregnant ?breast-feeding ?How should I use this medication? ?This drug is given as an infusion into a vein. It is administered in a hospital or clinic by a specially trained health care professional. ?Talk to your pediatrician regarding the use of this medicine in children. Special care may be needed. ?Overdosage: If you think you have taken too much of this medicine contact a poison control center or emergency  room at once. ?NOTE: This medicine is only for you. Do not share this medicine with others. ?What if I miss a dose? ?It is important not to miss a dose. Call your doctor or health care professional if you are unable to keep an appointment. ?What may interact with this medication? ?Do not take this medicine with any of the following medications: ?cisapride ?dronedarone ?pimozide ?thioridazine ?This medicine may also interact with the following medications: ?aspirin and aspirin-like medicines ?certain medicines that treat or prevent blood clots like warfarin, apixaban, dabigatran, and rivaroxaban ?cisplatin ?cyclosporine ?diuretics ?medicines for infection like acyclovir, adefovir, amphotericin B, bacitracin, cidofovir, foscarnet, ganciclovir, gentamicin, pentamidine, vancomycin ?NSAIDs, medicines for pain and inflammation, like ibuprofen or naproxen ?other medicines that prolong the QT interval (an abnormal heart rhythm) ?pamidronate ?zoledronic acid ?This list may not describe all possible interactions. Give your health care provider a list of all the medicines, herbs, non-prescription drugs, or dietary supplements you use. Also tell them if you smoke, drink alcohol, or use illegal drugs. Some items may interact with your medicine. ?What should I watch for while using this medication? ?Your condition will be monitored carefully while you are receiving this medicine. ?  You may need blood work done while you are taking this medicine. ?This medicine may make you feel generally unwell. This is not uncommon as chemotherapy can affect healthy cells as well as cancer cells. Report any side effects. Continue your course of treatment even though you feel ill unless your healthcare professional tells you to stop. ?This medicine can make you more sensitive to cold. Do not drink cold drinks or use ice. Cover exposed skin before coming in contact with cold temperatures or cold objects. When out in cold weather wear warm clothing  and cover your mouth and nose to warm the air that goes into your lungs. Tell your doctor if you get sensitive to the cold. ?Do not become pregnant while taking this medicine or for 9 months after stopping it. Women sho

## 2021-04-25 ENCOUNTER — Encounter (HOSPITAL_COMMUNITY): Payer: Self-pay | Admitting: Emergency Medicine

## 2021-04-25 ENCOUNTER — Inpatient Hospital Stay (HOSPITAL_COMMUNITY): Payer: BC Managed Care – PPO

## 2021-04-25 DIAGNOSIS — F419 Anxiety disorder, unspecified: Secondary | ICD-10-CM | POA: Diagnosis present

## 2021-04-25 DIAGNOSIS — R509 Fever, unspecified: Secondary | ICD-10-CM | POA: Diagnosis not present

## 2021-04-25 DIAGNOSIS — R502 Drug induced fever: Secondary | ICD-10-CM | POA: Diagnosis present

## 2021-04-25 DIAGNOSIS — K219 Gastro-esophageal reflux disease without esophagitis: Secondary | ICD-10-CM

## 2021-04-25 DIAGNOSIS — C7801 Secondary malignant neoplasm of right lung: Secondary | ICD-10-CM | POA: Diagnosis present

## 2021-04-25 DIAGNOSIS — R6520 Severe sepsis without septic shock: Secondary | ICD-10-CM | POA: Diagnosis not present

## 2021-04-25 DIAGNOSIS — E872 Acidosis, unspecified: Secondary | ICD-10-CM | POA: Diagnosis present

## 2021-04-25 DIAGNOSIS — A419 Sepsis, unspecified organism: Secondary | ICD-10-CM | POA: Diagnosis present

## 2021-04-25 DIAGNOSIS — C189 Malignant neoplasm of colon, unspecified: Secondary | ICD-10-CM | POA: Diagnosis not present

## 2021-04-25 DIAGNOSIS — T451X5A Adverse effect of antineoplastic and immunosuppressive drugs, initial encounter: Secondary | ICD-10-CM | POA: Diagnosis present

## 2021-04-25 DIAGNOSIS — Z8543 Personal history of malignant neoplasm of ovary: Secondary | ICD-10-CM | POA: Diagnosis not present

## 2021-04-25 DIAGNOSIS — R569 Unspecified convulsions: Secondary | ICD-10-CM

## 2021-04-25 DIAGNOSIS — C182 Malignant neoplasm of ascending colon: Secondary | ICD-10-CM | POA: Diagnosis present

## 2021-04-25 DIAGNOSIS — D509 Iron deficiency anemia, unspecified: Secondary | ICD-10-CM | POA: Diagnosis present

## 2021-04-25 DIAGNOSIS — F32A Depression, unspecified: Secondary | ICD-10-CM | POA: Diagnosis present

## 2021-04-25 DIAGNOSIS — Z9049 Acquired absence of other specified parts of digestive tract: Secondary | ICD-10-CM | POA: Diagnosis not present

## 2021-04-25 DIAGNOSIS — D6481 Anemia due to antineoplastic chemotherapy: Secondary | ICD-10-CM | POA: Diagnosis not present

## 2021-04-25 DIAGNOSIS — E1165 Type 2 diabetes mellitus with hyperglycemia: Secondary | ICD-10-CM

## 2021-04-25 DIAGNOSIS — Z823 Family history of stroke: Secondary | ICD-10-CM | POA: Diagnosis not present

## 2021-04-25 DIAGNOSIS — C7802 Secondary malignant neoplasm of left lung: Secondary | ICD-10-CM | POA: Diagnosis present

## 2021-04-25 DIAGNOSIS — Z20822 Contact with and (suspected) exposure to covid-19: Secondary | ICD-10-CM | POA: Diagnosis present

## 2021-04-25 DIAGNOSIS — Z803 Family history of malignant neoplasm of breast: Secondary | ICD-10-CM | POA: Diagnosis not present

## 2021-04-25 DIAGNOSIS — C787 Secondary malignant neoplasm of liver and intrahepatic bile duct: Secondary | ICD-10-CM | POA: Diagnosis present

## 2021-04-25 DIAGNOSIS — Z85038 Personal history of other malignant neoplasm of large intestine: Secondary | ICD-10-CM | POA: Diagnosis not present

## 2021-04-25 DIAGNOSIS — Z800 Family history of malignant neoplasm of digestive organs: Secondary | ICD-10-CM | POA: Diagnosis not present

## 2021-04-25 DIAGNOSIS — D696 Thrombocytopenia, unspecified: Secondary | ICD-10-CM | POA: Diagnosis present

## 2021-04-25 DIAGNOSIS — Z5189 Encounter for other specified aftercare: Secondary | ICD-10-CM | POA: Diagnosis not present

## 2021-04-25 DIAGNOSIS — Z8249 Family history of ischemic heart disease and other diseases of the circulatory system: Secondary | ICD-10-CM | POA: Diagnosis not present

## 2021-04-25 DIAGNOSIS — Z28310 Unvaccinated for covid-19: Secondary | ICD-10-CM | POA: Diagnosis not present

## 2021-04-25 DIAGNOSIS — D84821 Immunodeficiency due to drugs: Secondary | ICD-10-CM | POA: Diagnosis present

## 2021-04-25 DIAGNOSIS — Z801 Family history of malignant neoplasm of trachea, bronchus and lung: Secondary | ICD-10-CM | POA: Diagnosis not present

## 2021-04-25 LAB — CBC WITH DIFFERENTIAL/PLATELET
Abs Immature Granulocytes: 0.68 10*3/uL — ABNORMAL HIGH (ref 0.00–0.07)
Basophils Absolute: 0 10*3/uL (ref 0.0–0.1)
Basophils Relative: 1 %
Eosinophils Absolute: 0 10*3/uL (ref 0.0–0.5)
Eosinophils Relative: 0 %
HCT: 34.6 % — ABNORMAL LOW (ref 36.0–46.0)
Hemoglobin: 9.9 g/dL — ABNORMAL LOW (ref 12.0–15.0)
Immature Granulocytes: 8 %
Lymphocytes Relative: 2 %
Lymphs Abs: 0.1 10*3/uL — ABNORMAL LOW (ref 0.7–4.0)
MCH: 23 pg — ABNORMAL LOW (ref 26.0–34.0)
MCHC: 28.6 g/dL — ABNORMAL LOW (ref 30.0–36.0)
MCV: 80.3 fL (ref 80.0–100.0)
Monocytes Absolute: 0.3 10*3/uL (ref 0.1–1.0)
Monocytes Relative: 4 %
Neutro Abs: 7.6 10*3/uL (ref 1.7–7.7)
Neutrophils Relative %: 85 %
Platelets: 95 10*3/uL — ABNORMAL LOW (ref 150–400)
RBC: 4.31 MIL/uL (ref 3.87–5.11)
RDW: 19.1 % — ABNORMAL HIGH (ref 11.5–15.5)
WBC: 8.7 10*3/uL (ref 4.0–10.5)
nRBC: 0 % (ref 0.0–0.2)

## 2021-04-25 LAB — GLUCOSE, CAPILLARY
Glucose-Capillary: 281 mg/dL — ABNORMAL HIGH (ref 70–99)
Glucose-Capillary: 264 mg/dL — ABNORMAL HIGH (ref 70–99)
Glucose-Capillary: 314 mg/dL — ABNORMAL HIGH (ref 70–99)
Glucose-Capillary: 246 mg/dL — ABNORMAL HIGH (ref 70–99)
Glucose-Capillary: 236 mg/dL — ABNORMAL HIGH (ref 70–99)

## 2021-04-25 LAB — COMPREHENSIVE METABOLIC PANEL
ALT: 35 U/L (ref 0–44)
AST: 51 U/L — ABNORMAL HIGH (ref 15–41)
Albumin: 3.2 g/dL — ABNORMAL LOW (ref 3.5–5.0)
Alkaline Phosphatase: 158 U/L — ABNORMAL HIGH (ref 38–126)
Anion gap: 9 (ref 5–15)
BUN: 13 mg/dL (ref 6–20)
CO2: 22 mmol/L (ref 22–32)
Calcium: 8.7 mg/dL — ABNORMAL LOW (ref 8.9–10.3)
Chloride: 104 mmol/L (ref 98–111)
Creatinine, Ser: 0.58 mg/dL (ref 0.44–1.00)
GFR, Estimated: >60 mL/min (ref >60)
Glucose, Bld: 297 mg/dL — ABNORMAL HIGH (ref 70–99)
Potassium: 4.7 mmol/L (ref 3.5–5.1)
Sodium: 135 mmol/L (ref 135–145)
Total Bilirubin: 1.5 mg/dL — ABNORMAL HIGH (ref 0.3–1.2)
Total Protein: 6.8 g/dL (ref 6.5–8.1)

## 2021-04-25 LAB — LACTIC ACID, PLASMA
Lactic Acid, Venous: 2.9 mmol/L (ref 0.5–1.9)
Lactic Acid, Venous: 2.3 mmol/L (ref 0.5–1.9)

## 2021-04-25 LAB — PROTIME-INR
INR: 1.7 — ABNORMAL HIGH (ref 0.8–1.2)
Prothrombin Time: 20.1 seconds — ABNORMAL HIGH (ref 11.4–15.2)

## 2021-04-25 LAB — PROCALCITONIN: Procalcitonin: 2.14 ng/mL

## 2021-04-25 LAB — CORTISOL-AM, BLOOD: Cortisol - AM: 6.5 ug/dL — ABNORMAL LOW (ref 6.7–22.6)

## 2021-04-25 LAB — MAGNESIUM: Magnesium: 1.6 mg/dL — ABNORMAL LOW (ref 1.7–2.4)

## 2021-04-25 LAB — RESP PANEL BY RT-PCR (FLU A&B, COVID) ARPGX2
Influenza A by PCR: NEGATIVE
Influenza B by PCR: NEGATIVE
SARS Coronavirus 2 by RT PCR: NEGATIVE

## 2021-04-25 LAB — HIV ANTIBODY (ROUTINE TESTING W REFLEX): HIV Screen 4th Generation wRfx: NONREACTIVE

## 2021-04-25 LAB — PHOSPHORUS: Phosphorus: 3.5 mg/dL (ref 2.5–4.6)

## 2021-04-25 MED ORDER — LACTATED RINGERS IV BOLUS
1000.0000 mL | Freq: Once | INTRAVENOUS | Status: AC
Start: 2021-04-25 — End: 2021-04-25
  Administered 2021-04-25: 1000 mL via INTRAVENOUS

## 2021-04-25 MED ORDER — INSULIN ASPART 100 UNIT/ML IJ SOLN
0.0000 [IU] | Freq: Every day | INTRAMUSCULAR | Status: DC
  Administered 2021-04-25: 2 [IU] via SUBCUTANEOUS
  Administered 2021-04-26: 4 [IU] via SUBCUTANEOUS
  Administered 2021-04-27: 3 [IU] via SUBCUTANEOUS

## 2021-04-25 MED ORDER — LACOSAMIDE 50 MG PO TABS
200.0000 mg | ORAL_TABLET | Freq: Two times a day (BID) | ORAL | Status: DC
  Administered 2021-04-25 – 2021-04-28 (×8): 200 mg via ORAL
  Filled 2021-04-25 (×8): qty 4

## 2021-04-25 MED ORDER — SODIUM CHLORIDE 0.9 % IV SOLN: INTRAVENOUS | Status: DC

## 2021-04-25 MED ORDER — LIVING WELL WITH DIABETES BOOK: Freq: Once | Status: AC

## 2021-04-25 MED ORDER — OXYCODONE HCL 5 MG PO TABS: 5.0000 mg | ORAL_TABLET | ORAL | Status: DC | PRN

## 2021-04-25 MED ORDER — PANTOPRAZOLE SODIUM 40 MG IV SOLR
40.0000 mg | Freq: Every day | INTRAVENOUS | Status: DC
  Administered 2021-04-25 – 2021-04-28 (×4): 40 mg via INTRAVENOUS
  Filled 2021-04-25 (×4): qty 10

## 2021-04-25 MED ORDER — SODIUM CHLORIDE 0.9 % IV SOLN
2.0000 g | Freq: Once | INTRAVENOUS | Status: AC
  Administered 2021-04-25: 2 g via INTRAVENOUS
  Filled 2021-04-25: qty 2

## 2021-04-25 MED ORDER — VITAMIN B-12 1000 MCG PO TABS
1000.0000 ug | ORAL_TABLET | Freq: Every day | ORAL | Status: DC
  Administered 2021-04-25 – 2021-04-28 (×4): 1000 ug via ORAL
  Filled 2021-04-25 (×4): qty 1

## 2021-04-25 MED ORDER — INSULIN STARTER KIT- PEN NEEDLES (ENGLISH)
1.0000 | Freq: Once | Status: AC
  Administered 2021-04-25: 1
  Filled 2021-04-25: qty 1

## 2021-04-25 MED ORDER — HYDROXYZINE HCL 25 MG PO TABS
25.0000 mg | ORAL_TABLET | Freq: Four times a day (QID) | ORAL | Status: DC
  Administered 2021-04-25 – 2021-04-28 (×12): 25 mg via ORAL
  Filled 2021-04-25 (×13): qty 1

## 2021-04-25 MED ORDER — MAGNESIUM SULFATE 4 GM/100ML IV SOLN
4.0000 g | Freq: Once | INTRAVENOUS | Status: AC
  Administered 2021-04-25: 4 g via INTRAVENOUS
  Filled 2021-04-25: qty 100

## 2021-04-25 MED ORDER — LOPERAMIDE HCL 2 MG PO CAPS: 2.0000 mg | ORAL_CAPSULE | ORAL | Status: DC | PRN

## 2021-04-25 MED ORDER — FERROUS SULFATE 325 (65 FE) MG PO TABS
325.0000 mg | ORAL_TABLET | Freq: Every day | ORAL | Status: DC
  Administered 2021-04-25 – 2021-04-28 (×4): 325 mg via ORAL
  Filled 2021-04-25 (×4): qty 1

## 2021-04-25 MED ORDER — ONDANSETRON HCL 4 MG/2ML IJ SOLN
4.0000 mg | Freq: Four times a day (QID) | INTRAMUSCULAR | Status: DC | PRN
  Administered 2021-04-27: 4 mg via INTRAVENOUS
  Filled 2021-04-25: qty 2

## 2021-04-25 MED ORDER — LORAZEPAM 0.5 MG PO TABS
0.5000 mg | ORAL_TABLET | Freq: Two times a day (BID) | ORAL | Status: DC | PRN
  Administered 2021-04-25 – 2021-04-26 (×3): 0.5 mg via ORAL
  Filled 2021-04-25 (×3): qty 1

## 2021-04-25 MED ORDER — ACETAMINOPHEN 650 MG RE SUPP: 650.0000 mg | Freq: Four times a day (QID) | RECTAL | Status: DC | PRN

## 2021-04-25 MED ORDER — ALUM & MAG HYDROXIDE-SIMETH 200-200-20 MG/5ML PO SUSP: 30.0000 mL | Freq: Four times a day (QID) | ORAL | Status: DC | PRN

## 2021-04-25 MED ORDER — INSULIN ASPART 100 UNIT/ML IJ SOLN
0.0000 [IU] | Freq: Four times a day (QID) | INTRAMUSCULAR | Status: DC
  Administered 2021-04-25: 5 [IU] via SUBCUTANEOUS
  Administered 2021-04-25: 8 [IU] via SUBCUTANEOUS
  Administered 2021-04-25: 11 [IU] via SUBCUTANEOUS
  Administered 2021-04-26: 8 [IU] via SUBCUTANEOUS
  Administered 2021-04-26: 11 [IU] via SUBCUTANEOUS
  Administered 2021-04-26 (×2): 5 [IU] via SUBCUTANEOUS
  Administered 2021-04-27 (×2): 8 [IU] via SUBCUTANEOUS

## 2021-04-25 MED ORDER — INSULIN ASPART 100 UNIT/ML IJ SOLN: 0.0000 [IU] | Freq: Three times a day (TID) | INTRAMUSCULAR | Status: DC

## 2021-04-25 MED ORDER — ACETAMINOPHEN 325 MG PO TABS
650.0000 mg | ORAL_TABLET | Freq: Four times a day (QID) | ORAL | Status: DC | PRN
Start: 2021-04-25 — End: 2021-04-28
  Administered 2021-04-25 – 2021-04-27 (×4): 650 mg via ORAL
  Filled 2021-04-25 (×4): qty 2

## 2021-04-25 MED ORDER — INSULIN GLARGINE-YFGN 100 UNIT/ML ~~LOC~~ SOLN
15.0000 [IU] | Freq: Every day | SUBCUTANEOUS | Status: DC
  Administered 2021-04-25 – 2021-04-28 (×4): 15 [IU] via SUBCUTANEOUS
  Filled 2021-04-25 (×5): qty 0.15

## 2021-04-25 MED ORDER — PANTOPRAZOLE SODIUM 40 MG PO TBEC: 40.0000 mg | DELAYED_RELEASE_TABLET | Freq: Every day | ORAL | Status: DC

## 2021-04-25 MED ORDER — CHLORHEXIDINE GLUCONATE CLOTH 2 % EX PADS
6.0000 | MEDICATED_PAD | Freq: Every day | CUTANEOUS | Status: DC
  Administered 2021-04-25 – 2021-04-28 (×4): 6 via TOPICAL

## 2021-04-25 MED ORDER — DIPHENHYDRAMINE HCL 50 MG/ML IJ SOLN
25.0000 mg | Freq: Once | INTRAMUSCULAR | Status: DC
  Filled 2021-04-25: qty 1

## 2021-04-25 MED ORDER — KETOROLAC TROMETHAMINE 15 MG/ML IJ SOLN
15.0000 mg | Freq: Once | INTRAMUSCULAR | Status: AC
  Administered 2021-04-25: 15 mg via INTRAVENOUS
  Filled 2021-04-25: qty 1

## 2021-04-25 MED ORDER — SODIUM CHLORIDE 0.9 % IV SOLN
2.0000 g | Freq: Two times a day (BID) | INTRAVENOUS | Status: DC
  Administered 2021-04-25 – 2021-04-26 (×3): 2 g via INTRAVENOUS
  Filled 2021-04-25 (×3): qty 2

## 2021-04-25 MED ORDER — HEPARIN SODIUM (PORCINE) 5000 UNIT/ML IJ SOLN
5000.0000 [IU] | Freq: Three times a day (TID) | INTRAMUSCULAR | Status: DC
  Administered 2021-04-25 – 2021-04-28 (×9): 5000 [IU] via SUBCUTANEOUS
  Filled 2021-04-25 (×10): qty 1

## 2021-04-25 MED ORDER — ONDANSETRON HCL 4 MG PO TABS: 4.0000 mg | ORAL_TABLET | Freq: Four times a day (QID) | ORAL | Status: DC | PRN

## 2021-04-25 MED ORDER — MAGNESIUM SULFATE 4 GM/100ML IV SOLN: 4.0000 g | Freq: Once | INTRAVENOUS | Status: DC

## 2021-04-25 NOTE — Progress Notes (Signed)
Patient admitted to the hospital earlier this morning by Dr. Clearence Ped ? ?Patient seen and examined.  She was admitted with nausea, vomiting and fevers.  Symptoms started after she received her chemotherapy yesterday.  Reports similar reaction to chemo during first cycle of FOLFOX.  At that time, symptoms may have been attributed to possible urinary tract infection. ? ?She does not have any clear source of infection at this time.  Due to her immunosuppressed state, she has been started on antibiotics and cultures have been drawn.  We will need to monitor for any recurrence of fever.  Continue to follow-up cultures and any signs of developing infection.  I discussed her case with her primary oncologist, Dr. Benay Spice who felt that fevers may be related to oxaliplatin. ? ?If fevers resolved and cultures are negative, anticipate she should be able to discharge and follow-up with oncology for further management.  She currently does not have any nausea and vomiting appears to be tolerating diet. ? ?She is hyperglycemic and takes metformin as an outpatient.  Her chemotherapeutic regimen may also be exacerbating her sugars.  We will start on Semglee.  A1c from 03/2021 noted to be elevated at 9.8, may need to discharge with insulin.  Patient is agreeable. ? ?[name] ? ? ? ? ? ?

## 2021-04-25 NOTE — Progress Notes (Signed)
PA protonix 40 mg approved through [facility] from 04/25/21-04/24/22 ?

## 2021-04-25 NOTE — Progress Notes (Signed)
Nurse educated with a booklet and demonstrated to patient on how to administer insulin, pt received hands on practice with test insulin pen, pt verbally acknowledged understanding, pt states not comfortable with physically giving insulin to herself at this time yet, nurse will attempt the evening dose to see if patient if ready.   ?

## 2021-04-25 NOTE — Assessment & Plan Note (Addendum)
Patient reports that Pepcid and omeprazole has not been working for her at home ?Continue Protonix ?GI cocktail every 6 hours as needed ?

## 2021-04-25 NOTE — Assessment & Plan Note (Signed)
Continue Vimpat 

## 2021-04-25 NOTE — Consult Note (Signed)
[facility] Consultation Oncology  Name: [name]      MRN: [id number]    Location: N829/F621-30  Date: 04/25/2021 Time:5:07 PM   REFERRING PHYSICIAN: Dr. Kerry [name]  REASON FOR CONSULT: Fever and exhaustion   DIAGNOSIS: Metastatic colon cancer  HISTORY OF PRESENT ILLNESS: [name] is a 46 year old pleasant white female who is seen in consultation today at the request of Dr. Carren [name].  This patient has history of metastatic ascending colon adenocarcinoma, status post cycle 2 of FOLFOX on 04/24/2021.  She went home and noticed temperature increased to as high as 101.4 with chills and exhaustion.  She came to the emergency room, was found to be tachycardic and tachypneic.  Also had elevated lactic acid, with normal white count and moderate thrombocytopenia.  UA was positive for bacteria but negative for leukocytes.  Chest x-ray did not show any evidence of infection.  Blood cultures are pending.  She was started on cefepime.  Last fever was 102 at 1:45 AM today morning.  Since then her temperature has been varying between normal and 99.5.  Patient reports that she still has exertion.  She denies any nausea or vomiting.  She did not have any bowel movement since yesterday.  She does report some cold sensitivity.  She has 5-FU pump ongoing.  She also reports occasional pain in the left lower quadrant of the abdomen.  She works as a Scientist, research (medical) and has not been to work since cycle 1 of FOLFOX started on 04/10/2021.  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   Restless leg syndrome     Seizures (HCC)    last seizure 08/2012    ALLERGIES: Allergies  Allergen Reactions   Adhesive [Tape] Hives, Shortness Of Breath and Swelling    Tolerates paper tape   Clindamycin/Lincomycin Itching, Rash and Other (See Comments)    Generalized body rash with itching, lip ulcers, seizures   Promethazine Other (See Comments)    Increase restless leg movement    Eggs Or Egg-Derived Products Nausea And Vomiting   Metoclopramide Hcl Other (See Comments)    EXACERBATES RESTLESS LEG SYNDROME   Oxycodone-Acetaminophen Hives   Sulfa Antibiotics Nausea And Vomiting   Fluogen [Influenza Virus Vaccine] Nausea And Vomiting   Levaquin [Levofloxacin]     seizures   Penicillins Itching    Has patient had a PCN reaction causing immediate rash, facial/tongue/throat swelling, SOB or lightheadedness with hypotension: No Has patient had a PCN reaction causing severe rash involving mucus membranes or skin necrosis: No Has patient had a PCN reaction that required hospitalization No Has patient had a PCN reaction occurring within the last 10 years: No If all of the above answers are "NO", then may proceed with Cephalosporin use.       MEDICATIONS: I have reviewed the patient's current medications.     PAST SURGICAL HISTORY Past Surgical History:  Procedure Laterality Date   ABDOMINAL HYSTERECTOMY  08/24/2004   partial   APPENDECTOMY  08/24/2004   COLON SURGERY  08/11/2013   removed a foot of colon   COLONOSCOPY  2019   colonoscopy 10-18-14     COLONOSCOPY WITH PROPOFOL  07/07/2013   CYSTOSCOPY  11/01/2003   ENDOMETRIAL FULGURATION  11/01/2003   ESOPHAGOGASTRODUODENOSCOPY  07/07/2013   IR ANGIOGRAM SELECTIVE EACH ADDITIONAL VESSEL  11/03/2020   IR ANGIOGRAM SELECTIVE EACH ADDITIONAL VESSEL  11/03/2020   IR ANGIOGRAM SELECTIVE EACH ADDITIONAL VESSEL  11/03/2020   IR ANGIOGRAM SELECTIVE EACH ADDITIONAL VESSEL  11/03/2020   IR ANGIOGRAM SELECTIVE EACH ADDITIONAL VESSEL  11/17/2020   IR ANGIOGRAM SELECTIVE  EACH ADDITIONAL VESSEL  11/17/2020   IR ANGIOGRAM SELECTIVE EACH ADDITIONAL VESSEL  11/17/2020   IR ANGIOGRAM SELECTIVE EACH ADDITIONAL VESSEL  12/08/2020   IR ANGIOGRAM SELECTIVE EACH ADDITIONAL VESSEL  12/08/2020   IR ANGIOGRAM VISCERAL SELECTIVE  11/03/2020   IR ANGIOGRAM VISCERAL SELECTIVE  11/03/2020   IR ANGIOGRAM VISCERAL SELECTIVE  11/17/2020   IR ANGIOGRAM VISCERAL SELECTIVE  12/08/2020   IR EMBO ARTERIAL NOT HEMORR HEMANG INC GUIDE ROADMAPPING  11/03/2020   IR EMBO TUMOR ORGAN ISCHEMIA INFARCT INC GUIDE ROADMAPPING  11/17/2020   IR EMBO TUMOR ORGAN ISCHEMIA INFARCT INC GUIDE ROADMAPPING  12/08/2020   IR IMAGING GUIDED PORT INSERTION  03/05/2019   IR RADIOLOGIST EVAL & MGMT  10/06/2020   IR RADIOLOGIST EVAL & MGMT  01/17/2021   IR RADIOLOGIST EVAL & MGMT  03/17/2021   IR US GUIDE VASC ACCESS RIGHT  11/03/2020   IR US GUIDE VASC ACCESS RIGHT  11/17/2020   IR US GUIDE VASC ACCESS RIGHT  12/08/2020   LAPAROSCOPIC LYSIS OF ADHESIONS  11/01/2003   LAPAROSCOPIC PARTIAL COLECTOMY Right 08/11/2013   Procedure: LAPAROSCOPIC RIGHT HEMICOLECTOMY;  Surgeon: Almond Lint, MD;  Location: [facility] OR;  Service: General;  Laterality: Right;   LAPAROTOMY N/A 12/28/2014   Procedure: EXPLORATORY LAPAROTOMY;  Surgeon: Adolphus Birchwood, MD;  Location: [facility];  Service: Gynecology;  Laterality: N/A;   LEFT OOPHORECTOMY  08/24/2004   PANNICULECTOMY  08/24/2004   PORT-A-CATH REMOVAL Left 06/08/2014   Procedure: REMOVAL PORT-A-CATH;  Surgeon: Almond Lint, MD;  Location: [facility] OR;  Service: General;  Laterality: Left;   PORTACATH PLACEMENT Left 09/09/2013   Procedure: INSERTION PORT-A-CATH;  Surgeon: Almond Lint, MD;  Location: [facility];  Service: General;  Laterality: Left;   SALPINGOOPHORECTOMY Right 12/28/2014   Procedure: RIGHT SALPINGO OOPHORECTOMY;  Surgeon: Adolphus Birchwood, MD;  Location: [facility];  Service: Gynecology;  Laterality: Right;   TUBAL LIGATION  11/07/2000   UNILATERAL SALPINGECTOMY Left 08/24/2004   WISDOM  TOOTH EXTRACTION Bilateral 1994    FAMILY HISTORY: Family History  Problem Relation Age of Onset   Colon polyps Mother        3+ colon polyps at each colonoscopy - "several"   Colitis Mother    Heart attack Mother    Diabetes Mother    Heart disease Mother    Diabetes Father    Heart attack Father    Stroke Father    Congestive Heart Failure Father    Heart disease Father    Cirrhosis Maternal Aunt    Lung cancer Maternal Uncle        d. 65s; smoker and worked around asbestos   Other Paternal Grandfather        "bone cancer"   Colon cancer Maternal Uncle        dx early 27s   Breast cancer Maternal Aunt        dx 50s-60s; s/p lumpectomy  Dementia Maternal Aunt    Other Cousin        maternal 1st cousin w/ "lung issues"   Dementia Paternal Aunt    Dementia Paternal Uncle    Prostate cancer Paternal Uncle 16   Esophageal cancer Neg Hx    Rectal cancer Neg Hx    Stomach cancer Neg Hx     SOCIAL HISTORY:  reports that she has never smoked. She has never used smokeless tobacco. She reports that she does not drink alcohol and does not use drugs.  PERFORMANCE STATUS: The patient's performance status is 1 - Symptomatic but completely ambulatory  PHYSICAL EXAM: Most Recent Vital Signs: Blood pressure 118/72, pulse 95, temperature 99 F (37.2 C), temperature source Oral, resp. rate 18, height 5\' 6"  (1.676 m), weight 156 lb 8.4 oz (71 kg), SpO2 99 %. BP 118/72 (BP Location: Left Arm)   Pulse 95   Temp 99 F (37.2 C) (Oral)   Resp 18   Ht 5\' 6"  (1.676 m)   Wt 156 lb 8.4 oz (71 kg)   SpO2 99%   BMI 25.26 kg/m  General appearance: alert, cooperative, and appears stated age Lungs: clear to auscultation bilaterally Heart: regular rate and rhythm Abdomen:  Soft, tenderness in the left lower quadrant with no palpable mass. Extremities:  No edema or cyanosis. Neurologic: Grossly normal  LABORATORY DATA:  Results for orders placed or performed during the hospital  encounter of 04/24/21 (from the past 48 hour(s))  Comprehensive metabolic panel     Status: Abnormal   Collection Time: 04/24/21  8:43 PM  Result Value Ref Range   Sodium 133 (L) 135 - 145 mmol/L   Potassium 4.1 3.5 - 5.1 mmol/L   Chloride 101 98 - 111 mmol/L   CO2 20 (L) 22 - 32 mmol/L   Glucose, Bld 461 (H) 70 - 99 mg/dL    Comment: Glucose reference range applies only to samples taken after fasting for at least 8 hours.   BUN 11 6 - 20 mg/dL   Creatinine, Ser 0.10 0.44 - 1.00 mg/dL   Calcium 9.3 8.9 - 27.2 mg/dL   Total Protein 7.8 6.5 - 8.1 g/dL   Albumin 3.6 3.5 - 5.0 g/dL   AST 79 (H) 15 - 41 U/L   ALT 42 0 - 44 U/L   Alkaline Phosphatase 187 (H) 38 - 126 U/L   Total Bilirubin 1.4 (H) 0.3 - 1.2 mg/dL   GFR, Estimated >53 >66 mL/min    Comment: (NOTE) Calculated using the CKD-EPI Creatinine Equation (2021)    Anion gap 12 5 - 15    Comment: Performed at [facility], [address], [place], [place] [address]  Lactic acid, plasma     Status: Abnormal   Collection Time: 04/24/21  8:43 PM  Result Value Ref Range   Lactic Acid, Venous 3.6 (HH) 0.5 - 1.9 mmol/L    Comment: CRITICAL RESULT CALLED TO, READ BACK BY AND VERIFIED WITH: WALL,E ON 04/24/21 AT 2135 BY LOY,C Performed at [facility], [address] [place], [place] [address]   CBC with Differential     Status: Abnormal   Collection Time: 04/24/21  8:43 PM  Result Value Ref Range   WBC 7.8 4.0 - 10.5 K/uL   RBC 4.38 3.87 - 5.11 MIL/uL   Hemoglobin 10.7 (L) 12.0 - 15.0 g/dL   HCT 56.3 (L) 87.5 - 64.3 %   MCV 79.9 (L) 80.0 - 100.0 fL   MCH 24.4 (L)  26.0 - 34.0 pg   MCHC 30.6 30.0 - 36.0 g/dL   RDW 95.6 (H) 21.3 - 08.6 %   Platelets 86 (L) 150 - 400 K/uL    Comment: SPECIMEN CHECKED FOR CLOTS Immature Platelet Fraction may be clinically indicated, consider ordering this additional test [id number] PLATELET COUNT CONFIRMED BY SMEAR    nRBC 0.0 0.0 - 0.2 %   Neutrophils Relative % 87 %   Neutro Abs 6.8 1.7 - 7.7  K/uL   Lymphocytes Relative 1 %   Lymphs Abs 0.1 (L) 0.7 - 4.0 K/uL   Monocytes Relative 3 %   Monocytes Absolute 0.2 0.1 - 1.0 K/uL   Eosinophils Relative 0 %   Eosinophils Absolute 0.0 0.0 - 0.5 K/uL   Basophils Relative 1 %   Basophils Absolute 0.1 0.0 - 0.1 K/uL   WBC Morphology TOXIC GRANULATION    Immature Granulocytes 8 %   Abs Immature Granulocytes 0.61 (H) 0.00 - 0.07 K/uL   Polychromasia PRESENT     Comment: Performed at [facility], [address], [place], [place] [address]  Protime-INR     Status: Abnormal   Collection Time: 04/24/21  8:43 PM  Result Value Ref Range   Prothrombin Time 17.7 (H) 11.4 - 15.2 seconds   INR 1.5 (H) 0.8 - 1.2    Comment: (NOTE) INR goal varies based on device and disease states. Performed at [facility], [address] [place], [place] [address]   Culture, blood (Routine x 2)     Status: None (Preliminary result)   Collection Time: 04/24/21  8:43 PM   Specimen: Left Antecubital; Blood  Result Value Ref Range   Specimen Description LEFT ANTECUBITAL    Special Requests      BOTTLES DRAWN AEROBIC AND ANAEROBIC Blood Culture adequate volume   Culture      NO GROWTH < 12 HOURS Performed at [facility], [address], [place], [place] [address]    Report Status PENDING   Urinalysis, Routine w reflex microscopic Urine, Clean Catch     Status: Abnormal   Collection Time: 04/24/21  9:02 PM  Result Value Ref Range   Color, Urine YELLOW YELLOW   APPearance CLEAR CLEAR   Specific Gravity, Urine 1.033 (H) 1.005 - 1.030   pH 6.0 5.0 - 8.0   Glucose, UA >=500 (A) NEGATIVE mg/dL   Hgb urine dipstick SMALL (A) NEGATIVE   Bilirubin Urine NEGATIVE NEGATIVE   Ketones, ur 5 (A) NEGATIVE mg/dL   Protein, ur NEGATIVE NEGATIVE mg/dL   Nitrite NEGATIVE NEGATIVE   Leukocytes,Ua NEGATIVE NEGATIVE   RBC / HPF 0-5 0 - 5 RBC/hpf   WBC, UA 0-5 0 - 5 WBC/hpf   Bacteria, UA MANY (A) NONE SEEN   Squamous Epithelial / LPF 0-5 0 - 5   Budding Yeast PRESENT      Comment: Performed at [facility], [address], [place], [place] [address]  Culture, blood (Routine x 2)     Status: None (Preliminary result)   Collection Time: 04/24/21  9:49 PM   Specimen: Right Antecubital; Blood  Result Value Ref Range   Specimen Description RIGHT ANTECUBITAL    Special Requests      BOTTLES DRAWN AEROBIC AND ANAEROBIC Blood Culture results may not be optimal due to an excessive volume of blood received in culture bottles   Culture      NO GROWTH < 12 HOURS Performed at [facility], [address] [place], [place] [address]  Report Status PENDING   Lactic acid, plasma     Status: Abnormal   Collection Time: 04/24/21 10:45 PM  Result Value Ref Range   Lactic Acid, Venous 2.9 (HH) 0.5 - 1.9 mmol/L    Comment: CRITICAL RESULT CALLED TO, READ BACK BY AND VERIFIED WITH: MPhillips Climes AT 0002 ON 03.21.23 BY ADGER J Performed at [facility], [address], [place], [place] [address]   Resp Panel by RT-PCR (Flu A&B, Covid) Nasopharyngeal Swab     Status: None   Collection Time: 04/24/21 11:16 PM   Specimen: Nasopharyngeal Swab; Nasopharyngeal(NP) swabs in vial transport medium  Result Value Ref Range   SARS Coronavirus 2 by RT PCR NEGATIVE NEGATIVE    Comment: (NOTE) SARS-CoV-2 target nucleic acids are NOT DETECTED.  The SARS-CoV-2 RNA is generally detectable in upper respiratory specimens during the acute phase of infection. The lowest concentration of SARS-CoV-2 viral copies this assay can detect is 138 copies/mL. A negative result does not preclude SARS-Cov-2 infection and should not be used as the sole basis for treatment or other patient management decisions. A negative result may occur with  improper specimen collection/handling, submission of specimen other than nasopharyngeal swab, presence of viral mutation(s) within the areas targeted by this assay, and inadequate number of viral copies(<138 copies/mL). A negative result must be combined  with clinical observations, patient history, and epidemiological information. The expected result is Negative.  Fact Sheet for Patients:  BloggerCourse.com  Fact Sheet for Healthcare Providers:  SeriousBroker.it  This test is no t yet approved or cleared by the [place] FDA and  has been authorized for detection and/or diagnosis of SARS-CoV-2 by FDA under an Emergency Use Authorization (EUA). This EUA will remain  in effect (meaning this test can be used) for the duration of the COVID-19 declaration under Section 564(b)(1) of the Act, 21 U.S.C.section 360bbb-3(b)(1), unless the authorization is terminated  or revoked sooner.       Influenza A by PCR NEGATIVE NEGATIVE   Influenza B by PCR NEGATIVE NEGATIVE    Comment: (NOTE) The Xpert Xpress SARS-CoV-2/FLU/RSV plus assay is intended as an aid in the diagnosis of influenza from Nasopharyngeal swab specimens and should not be used as a sole basis for treatment. Nasal washings and aspirates are unacceptable for Xpert Xpress SARS-CoV-2/FLU/RSV testing.  Fact Sheet for Patients: BloggerCourse.com  Fact Sheet for Healthcare Providers: SeriousBroker.it  This test is not yet approved or cleared by the [place] FDA and has been authorized for detection and/or diagnosis of SARS-CoV-2 by FDA under an Emergency Use Authorization (EUA). This EUA will remain in effect (meaning this test can be used) for the duration of the COVID-19 declaration under Section 564(b)(1) of the Act, 21 U.S.C. section 360bbb-3(b)(1), unless the authorization is terminated or revoked.  Performed at [facility], [address], [place], [place] [address]   Glucose, capillary     Status: Abnormal   Collection Time: 04/25/21  6:00 AM  Result Value Ref Range   Glucose-Capillary 281 (H) 70 - 99 mg/dL    Comment: Glucose reference range applies only to  samples taken after fasting for at least 8 hours.  HIV Antibody (routine testing w rflx)     Status: None   Collection Time: 04/25/21  6:39 AM  Result Value Ref Range   HIV Screen 4th Generation wRfx Non Reactive Non Reactive    Comment: Performed at [facility], [address] [place], [place] [address]  Protime-INR  Status: Abnormal   Collection Time: 04/25/21  6:39 AM  Result Value Ref Range   Prothrombin Time 20.1 (H) 11.4 - 15.2 seconds   INR 1.7 (H) 0.8 - 1.2    Comment: (NOTE) INR goal varies based on device and disease states. Performed at [facility], [address], [place], [place] [address]   Cortisol-am, blood     Status: Abnormal   Collection Time: 04/25/21  6:39 AM  Result Value Ref Range   Cortisol - AM 6.5 (L) 6.7 - 22.6 ug/dL    Comment: Performed at [facility], [address] [place], [place] [address]  Procalcitonin     Status: None   Collection Time: 04/25/21  6:39 AM  Result Value Ref Range   Procalcitonin 2.14 ng/mL    Comment:        Interpretation: PCT > 2 ng/mL: Systemic infection (sepsis) is likely, unless other causes are known. (NOTE)       Sepsis PCT Algorithm           Lower Respiratory Tract                                      Infection PCT Algorithm    ----------------------------     ----------------------------         PCT < 0.25 ng/mL                PCT < 0.10 ng/mL          Strongly encourage             Strongly discourage   discontinuation of antibiotics    initiation of antibiotics    ----------------------------     -----------------------------       PCT 0.25 - 0.50 ng/mL            PCT 0.10 - 0.25 ng/mL               OR       >80% decrease in PCT            Discourage initiation of                                            antibiotics      Encourage discontinuation           of antibiotics    ----------------------------     -----------------------------         PCT >= 0.50 ng/mL              PCT 0.26 - 0.50  ng/mL               AND       <80% decrease in PCT              Encourage initiation of                                             antibiotics       Encourage continuation           of antibiotics    ----------------------------     -----------------------------  PCT >= 0.50 ng/mL                  PCT > 0.50 ng/mL               AND         increase in PCT                  Strongly encourage                                      initiation of antibiotics    Strongly encourage escalation           of antibiotics                                     -----------------------------                                           PCT <= 0.25 ng/mL                                                 OR                                        > 80% decrease in PCT                                      Discontinue / Do not initiate                                             antibiotics  Performed at [facility], [address], [place], [place] [address]   Comprehensive metabolic panel     Status: Abnormal   Collection Time: 04/25/21  6:39 AM  Result Value Ref Range   Sodium 135 135 - 145 mmol/L   Potassium 4.7 3.5 - 5.1 mmol/L   Chloride 104 98 - 111 mmol/L   CO2 22 22 - 32 mmol/L   Glucose, Bld 297 (H) 70 - 99 mg/dL    Comment: Glucose reference range applies only to samples taken after fasting for at least 8 hours.   BUN 13 6 - 20 mg/dL   Creatinine, Ser 5.78 0.44 - 1.00 mg/dL   Calcium 8.7 (L) 8.9 - 10.3 mg/dL   Total Protein 6.8 6.5 - 8.1 g/dL   Albumin 3.2 (L) 3.5 - 5.0 g/dL   AST 51 (H) 15 - 41 U/L   ALT 35 0 - 44 U/L   Alkaline Phosphatase 158 (H) 38 - 126 U/L   Total Bilirubin 1.5 (H) 0.3 - 1.2 mg/dL   GFR, Estimated >46 >96 mL/min    Comment: (NOTE) Calculated using the CKD-EPI Creatinine Equation (2021)    Anion gap 9 5 - 15  Comment: Performed at [facility], [address], [place], [place] [address]  Magnesium     Status: Abnormal   Collection Time: 04/25/21  6:39 AM   Result Value Ref Range   Magnesium 1.6 (L) 1.7 - 2.4 mg/dL    Comment: Performed at [facility], [address], [place], [place] [address]  CBC WITH DIFFERENTIAL     Status: Abnormal   Collection Time: 04/25/21  6:39 AM  Result Value Ref Range   WBC 8.7 4.0 - 10.5 K/uL   RBC 4.31 3.87 - 5.11 MIL/uL   Hemoglobin 9.9 (L) 12.0 - 15.0 g/dL   HCT 10.9 (L) 32.3 - 55.7 %   MCV 80.3 80.0 - 100.0 fL   MCH 23.0 (L) 26.0 - 34.0 pg   MCHC 28.6 (L) 30.0 - 36.0 g/dL   RDW 32.2 (H) 02.5 - 42.7 %   Platelets 95 (L) 150 - 400 K/uL    Comment: Immature Platelet Fraction may be clinically indicated, consider ordering this additional test [id number]    nRBC 0.0 0.0 - 0.2 %   Neutrophils Relative % 85 %   Neutro Abs 7.6 1.7 - 7.7 K/uL   Lymphocytes Relative 2 %   Lymphs Abs 0.1 (L) 0.7 - 4.0 K/uL   Monocytes Relative 4 %   Monocytes Absolute 0.3 0.1 - 1.0 K/uL   Eosinophils Relative 0 %   Eosinophils Absolute 0.0 0.0 - 0.5 K/uL   Basophils Relative 1 %   Basophils Absolute 0.0 0.0 - 0.1 K/uL   Immature Granulocytes 8 %   Abs Immature Granulocytes 0.68 (H) 0.00 - 0.07 K/uL    Comment: Performed at [facility], [address], [place], [place] [address]  Lactic acid, plasma     Status: Abnormal   Collection Time: 04/25/21  6:39 AM  Result Value Ref Range   Lactic Acid, Venous 2.3 (HH) 0.5 - 1.9 mmol/L    Comment: CRITICAL VALUE NOTED.  VALUE IS CONSISTENT WITH PREVIOUSLY REPORTED AND CALLED VALUE. Performed at [facility], [address], [place], [place] [address]   Phosphorus     Status: None   Collection Time: 04/25/21  6:39 AM  Result Value Ref Range   Phosphorus 3.5 2.5 - 4.6 mg/dL    Comment: Performed at [facility], [address], [place], [place] [address]  Glucose, capillary     Status: Abnormal   Collection Time: 04/25/21  7:53 AM  Result Value Ref Range   Glucose-Capillary 264 (H) 70 - 99 mg/dL    Comment: Glucose reference range applies only to samples taken after fasting  for at least 8 hours.   Comment 1 Notify RN    Comment 2 Document in Chart   Glucose, capillary     Status: Abnormal   Collection Time: 04/25/21 11:06 AM  Result Value Ref Range   Glucose-Capillary 314 (H) 70 - 99 mg/dL    Comment: Glucose reference range applies only to samples taken after fasting for at least 8 hours.   Comment 1 Notify RN    Comment 2 Document in Chart   Glucose, capillary     Status: Abnormal   Collection Time: 04/25/21  4:32 PM  Result Value Ref Range   Glucose-Capillary 246 (H) 70 - 99 mg/dL    Comment: Glucose reference range applies only to samples taken after fasting for at least 8 hours.   Comment 1 Notify RN    Comment 2 Document in Chart    *Note: Due  to a large number of results and/or encounters for the requested time period, some results have not been displayed. A complete set of results can be found in Results Review.      RADIOGRAPHY: DG Chest Portable 1 View  Result Date: 04/25/2021 CLINICAL DATA:  Fever EXAM: PORTABLE CHEST 1 VIEW COMPARISON:  CT chest dated 03/16/2021 FINDINGS: Device overlying the patient (associated with the chest port) along the lateral left hemithorax. Lungs are essentially clear.  No pleural effusion or pneumothorax. The heart is normal in size. Left chest power port terminates at the cavoatrial junction. IMPRESSION: No evidence of acute cardiopulmonary disease. Electronically Signed   By: Charline Bills M.D.   On: 04/25/2021 00:26   US Abdomen Limited RUQ (LIVER/GB)  Result Date: 04/25/2021 CLINICAL DATA:  Nausea abdominal pain. History of metastatic colon cancer and status post prior hepatic Y 90 radio embolization EXAM: ULTRASOUND ABDOMEN LIMITED RIGHT UPPER QUADRANT COMPARISON:  None. FINDINGS: Gallbladder: Gallbladder is slit like and nondistended with a prominent wall measuring 2 mm. Tiny layering gallstones within the gallbladder. No Murphy's sign elicited. Upper abdominal ascites noted. No signs of acute cholecystitis.  Common bile duct: Diameter: 3.4 mm Liver: Mildly heterogeneous with known liver metastases better demonstrated by CT comparison. Patient is status post prior hepatic Y 90 radio embolizations. Liver has increased echogenicity consistent with background hepatic steatosis. Liver is also mildly enlarged. Portal vein is patent on color Doppler imaging with normal direction of blood flow towards the liver. Other: Small amount upper abdominal surrounding ascites. IMPRESSION: Collapsed gallbladder containing small layering gallstones. No signs of cholecystitis. No biliary dilatation or obstruction. Hepatic steatosis and mild hepatic enlargement. Known hepatic metastases are not well demonstrated by ultrasound following prior Y 90 radioembolizations. Upper abdominal ascites. Electronically Signed   By: Judie Petit.  Shick M.D.   On: 04/25/2021 14:30        ASSESSMENT and PLAN:  1.  Metastatic ascending colon adenocarcinoma to the liver and lungs: - Status post cycle 2 of FOLFOX on 04/24/2021. - Admitted with fever, chills and exhaustion.  So far cultures are pending and a chest x-ray was unrevealing.  White count was normal.  She is started on cefepime.  Fevers have improved since morning today. - Right upper quadrant ultrasound shows collapsed gallbladder containing small layering gallstones with no signs of cholecystitis.  No biliary dilatation or obstruction.  Mild hepatic enlargement with known liver metastasis.  Upper abdominal ascites. - If there is no clear source of infection, wonder if fever is from reaction to oxaliplatin. - She has left lower quadrant pain, likely from metastatic disease in the peritoneum/pelvis. - Follow-up on cultures.  Continue antibiotics.  May be able to go home once she feels better and has been afebrile for at least 24 hours.  She will follow-up with Dr. Truett Perna upon discharge.  All questions were answered. The patient knows to call the clinic with any problems, questions or concerns. We  can certainly see the patient much sooner if necessary.    Doreatha Massed

## 2021-04-25 NOTE — Assessment & Plan Note (Addendum)
Patient was febrile, tachycardic, tachypneic on arrival ?Evidence of endorgan damage with lactic acidosis initially 3.6, then 2.9 ?Unclear source with no growth on blood cultures and urine cultures pending ?Negative respiratory panel ?UA is borderline-urine culture pending -this is most likely source as patient recently had E. coli UTI ?Chest x-ray shows no evidence of acute cardiopulmonary disease ?Patient was started on cefepime in the ED, given 1 L LR, 1 L NS, and Tylenol ?Patient remained febrile and was given Toradol at admission ?Follow-up urine cultures negative ?Cefepime discontinued 3/22 ?

## 2021-04-25 NOTE — Progress Notes (Signed)
Inpatient Diabetes Program Recommendations ? ?AACE/ADA: New Consensus Statement on Inpatient Glycemic Control (2015) ? ?Target Ranges:  Prepandial:   less than 140 mg/dL ?     Peak postprandial:   less than 180 mg/dL (1-2 hours) ?     Critically ill patients:  140 - 180 mg/dL  ? ?Lab Results  ?Component Value Date  ? GLUCAP 264 (H) 04/25/2021  ? HGBA1C 9.8 (H) 03/16/2021  ? ? ?Review of Glycemic Control ? Latest Reference Range & Units 04/25/21 06:00 04/25/21 07:53  ?Glucose-Capillary 70 - 99 mg/dL 281 (H) 264 (H)  ?(H): Data is abnormally high ?Diabetes history: Type 2 Dm ?Outpatient Diabetes medications: Metformin 500 mg BID ?Current orders for Inpatient glycemic control: Novolog 0-15 units TID & HS ?Decadron 10 mg x 1 ? ?Inpatient Diabetes Program Recommendations:   ? ?Consider adding Semglee 10 units QD.  ? ?Spoke with patient regarding outpatient diabetes management.  ?Reviewed patient's current A1c of 9.8%. Explained what a A1c is and what it measures. Also reviewed goal A1c with patient, importance of good glucose control @ home, and blood sugar goals. Reviewed patho of Dm, role of pancreas, impact of steroids/infection/chronic use of chemotherapy agents, signs and symptoms of hypo vs hyper glycemia, interventions, vascular changes and commorbidities. ?Patient has a meter at home. Reviewed recommended frequency for checking CBGs and when to call MD.  ?Admits to drinking sugary beverages at times, especially when following chemotherapy treatment. Reviewed alternatives, however provided understanding for intake vs lack of intake.  ?Encouraged to begin learning how to perform self injections. LWWDM booklet ordered, videos sent to patient's cell and insulin starter kit ordered. Secure chat sent to RN to begin assisting.  ?Patient able to verbalize interventions and seems appropriate. Secure chat sent to MD. In agreement.  ? ?Thanks, ?Bronson Curb, MSN, RNC-OB ?Diabetes Coordinator ?[phone] (8a-5p) ? ? ? ?

## 2021-04-25 NOTE — TOC Initial Note (Signed)
Transition of Care (TOC) - Initial/Assessment Note  ? ? ?Patient Details  ?Name: [name] ?MRN: [id number] ?Date of Birth: [birth year] ? ?Transition of Care (TOC) CM/SW Contact:    ?Shade Flood, LCSW ?Phone Number: ?04/25/2021, 2:01 PM ? ?Clinical Narrative:                 ? ?Pt admitted from home. She lives with her husband at home and plans to return home at dc. It appears pt is independent in ADLs at home. Anticipating pt will return home at dc. TOC will follow and assist if dc needs arise. ? ?Expected Discharge Plan: Home/Self Care ?Barriers to Discharge: Continued Medical Work up ? ? ?Patient Goals and CMS Choice ?Patient states their goals for this hospitalization and ongoing recovery are:: go home ?  ?  ? ?Expected Discharge Plan and Services ?Expected Discharge Plan: Home/Self Care ?In-house Referral: Clinical Social Work ?  ?  ?Living arrangements for the past 2 months: [place] ?                ?  ?  ?  ?  ?  ?  ?  ?  ?  ?  ? ?Prior Living Arrangements/Services ?Living arrangements for the past 2 months: [place] ?Lives with:: Spouse ?Patient language and need for interpreter reviewed:: Yes ?Do you feel safe going back to the place where you live?: Yes      ?Need for Family Participation in Patient Care: No (Comment) ?Care giver support system in place?: Yes (comment) ?  ?Criminal Activity/Legal Involvement Pertinent to Current Situation/Hospitalization: No - Comment as needed ? ?Activities of Daily Living ?Home Assistive Devices/Equipment: None ?ADL Screening (condition at time of admission) ?Patient's cognitive ability adequate to safely complete daily activities?: Yes ?Is the patient deaf or have difficulty hearing?: No ?Does the patient have difficulty seeing, even when wearing glasses/contacts?: Yes ?Does the patient have difficulty concentrating, remembering, or making decisions?: No ?Patient able to express need for assistance with ADLs?: Yes ?Does the patient have  difficulty dressing or bathing?: No ?Independently performs ADLs?: Yes (appropriate for developmental age) ?Does the patient have difficulty walking or climbing stairs?: No ?Weakness of Legs: None ?Weakness of Arms/Hands: None ? ?Permission Sought/Granted ?  ?  ?   ?   ?   ?   ? ?Emotional Assessment ?  ?  ?  ?Orientation: : Oriented to Self, Oriented to Place, Oriented to  Time, Oriented to Situation ?Alcohol / Substance Use: Not Applicable ?Psych Involvement: No (comment) ? ?Admission diagnosis:  Sepsis ([place]) [A41.9] ?Patient Active Problem List  ? Diagnosis Date Noted  ? Sepsis ([place]) 04/25/2021  ? Diabetes mellitus, type 2 ([place]) 06/02/2020  ? UTI (urinary tract infection) 06/02/2020  ? Liver metastases ([place])   ? Metastatic colon cancer to liver [facility])   ? Palliative care by specialist   ? COVID-19 virus infection 09/17/2019  ? Port-A-Cath in place 06/04/2019  ? Seizure ([place]) 04/10/2019  ? Goals of care, counseling/discussion 02/23/2019  ? 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 Ileus ([place])   ? Metastatic malignant neoplasm ([place])   ? Genetic testing 11/13/2015  ? Sinusitis 01/29/2015  ? Pelvic mass in female 12/28/2014  ? Ovarian mass, right   ? Hypokalemia 02/03/2014  ? Nausea 12/20/2013  ? Fever 12/18/2013  ? Dehydration 12/18/2013  ? Diarrhea 10/28/2013  ? Malfunction of device 10/28/2013  ? Weight loss 10/28/2013  ? Mucositis (ulcerative) due to antineoplastic therapy 10/28/2013  ?  Anemia due to antineoplastic chemotherapy 09/25/2013  ? Chemotherapy induced thrombocytopenia 09/25/2013  ? Hypomagnesemia 09/25/2013  ? Hypophosphatemia 09/25/2013  ? Adverse drug effect 09/25/2013  ? Abdominal pain 09/24/2013  ? Cancer of ascending colon s/p right colectomy 08/11/2013 08/05/2013  ? Anemia, iron deficiency 01/28/2013  ? 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 Discoloration of skin of face 09/03/2012  ? Medication reaction 05/03/2012  ? Migraine 03/31/2012  ? Hyperglycemia 01/18/2012  ? Fatigue 12/12/2011   ? ADD (attention deficit disorder) 09/23/2010  ? Right wrist pain 05/01/2010  ? LIBIDO, DECREASED 04/07/2010  ? IRON DEFICIENCY 05/17/2009  ? ANA POSITIVE 05/17/2009  ? OVARIAN CYST 05/10/2009  ? FATTY LIVER DISEASE 03/24/2009  ? TRANSAMINASES, SERUM, ELEVATED 03/21/2009  ? Gastroparesis 08/16/2008  ? DEPRESSION/ANXIETY 09/19/2006  ? HYPERLIPIDEMIA 08/16/2006  ? RESTLESS LEG SYNDROME 08/16/2006  ? GERD 08/16/2006  ? IBS 08/16/2006  ? ?PCP:  [name], Raymond J, MD ?Pharmacy:   ?[facility], [place] [address] ?[address] ?[place]  [address] ?Phone: [phone] Fax: [phone] ? ? ? ? ?Social Determinants of Health (SDOH) Interventions ?  ? ?Readmission Risk Interventions ?No flowsheet data found. ? ? ?

## 2021-04-25 NOTE — ED Notes (Signed)
Dr. Earnest Conroy made aware of pt temp. Orders to be placed.  ?

## 2021-04-25 NOTE — ED Provider Notes (Signed)
?  Provider Note ?MRN:  [id number]  ?Arrival date & time: 04/25/21    ?ED Course and Medical Decision Making  ?Assumed care from Dr. Gilford Raid at shift change. ? ?Metastatic cancer here with fever, unclear source, does have increased cough.  Issue obtaining x-ray given patient's cancer pump.  Providing empiric antibiotics, will need admission. ? ?.Critical Care ?Performed by: Maudie Flakes, MD ?Authorized by: Maudie Flakes, MD  ? ?Critical care provider statement:  ?  Critical care time (minutes):  35 ?  Critical care was necessary to treat or prevent imminent or life-threatening deterioration of the following conditions:  Sepsis ?  Critical care was time spent personally by me on the following activities:  Development of treatment plan with patient or surrogate, discussions with consultants, evaluation of patient's response to treatment, examination of patient, ordering and review of laboratory studies, ordering and review of radiographic studies, ordering and performing treatments and interventions, pulse oximetry, re-evaluation of patient's condition and review of old charts ? ?Final Clinical Impressions(s) / ED Diagnoses  ? ?  ICD-10-CM   ?1. Fever, unspecified  R50.9   ?  ?  ?ED Discharge Orders   ? ? None  ? ?  ?  ?Discharge Instructions   ?None ?  ? ? ?Barth Kirks. Sedonia Small, MD ?[facility] Emergency Medicine ?[email or web address] ? ?  ?Maudie Flakes, MD ?04/25/21 [phone] ? ?

## 2021-04-25 NOTE — Progress Notes (Signed)
Pharmacy Antibiotic Note ? ?[name] is a 46 y.o. female admitted on 04/24/2021 with UTI.  Pharmacy has been consulted for cefepime dosing. ? ?WBC 4.6 >> 7.8 ? ?Plan: ?Cefepime 2 g IV q12h ? ?Height: '5\' 6"'$  (167.6 cm) ?Weight: 69.8 kg (153 lb 14.1 oz) ?IBW/kg (Calculated) : 59.3 ? ?Temp (24hrs), Avg:100.7 ?F (38.2 ?C), Min:97.9 ?F (36.6 ?C), Max:102 ?F (38.9 ?C) ? ?Recent Labs  ?Lab 04/24/21 ?0850 04/24/21 ?2043 04/24/21 ?2245  ?WBC 4.6 7.8  --   ?CREATININE 0.50 0.73  --   ?LATICACIDVEN  --  3.6* 2.9*  ?  ?Estimated Creatinine Clearance: 81.4 mL/min (by C-G formula based on SCr of 0.73 mg/dL).   ? ?Allergies  ?Allergen Reactions  ? Adhesive [Tape] Hives, Shortness Of Breath and Swelling  ?  Tolerates paper tape  ? Clindamycin/Lincomycin Itching, Rash and Other (See Comments)  ?  Generalized body rash with itching, lip ulcers, seizures  ? Promethazine Other (See Comments)  ?  Increase restless leg movement   ? Eggs Or Egg-Derived Products Nausea And Vomiting  ? Metoclopramide Hcl Other (See Comments)  ?  EXACERBATES RESTLESS LEG SYNDROME  ? Oxycodone-Acetaminophen Hives  ? Sulfa Antibiotics Nausea And Vomiting  ? Fluogen [Influenza Virus Vaccine] Nausea And Vomiting  ? Levaquin [Levofloxacin]   ?  seizures  ? Penicillins Itching  ?  Has patient had a PCN reaction causing immediate rash, facial/tongue/throat swelling, SOB or lightheadedness with hypotension: No ?Has patient had a PCN reaction causing severe rash involving mucus membranes or skin necrosis: No ?Has patient had a PCN reaction that required hospitalization No ?Has patient had a PCN reaction occurring within the last 10 years: No ?If all of the above answers are "NO", then may proceed with Cephalosporin use. ?  ? ? ?Antimicrobials this admission: ?Cefepime 3/21 >> ? ?Thank you for allowing pharmacy to be a part of this patient?s care. ? ?Carma Lair, PharmD Candidate [phone] ?04/25/2021 2:44 AM ? ?

## 2021-04-25 NOTE — Assessment & Plan Note (Addendum)
Currently stable, continue B12 and iron ?

## 2021-04-25 NOTE — ED Notes (Signed)
EDP made aware of pt temp. ?

## 2021-04-25 NOTE — Progress Notes (Signed)
PA started on protonix 40 mg through [facility] on 04/24/2021.  Can take up to 72 hrs for decision to be made. ?

## 2021-04-25 NOTE — Assessment & Plan Note (Signed)
Continue Vistaril and Ativan ?

## 2021-04-25 NOTE — H&P (Signed)
?History and Physical  ? ? ?Patient: [name] DOB: March 29, 1975 ?DOA: 04/24/2021 ?DOS: the patient was seen and examined on 04/25/2021 ?PCP: [name], Raymond J, MD  ?Patient coming from: Home ? ?Chief Complaint:  ?Chief Complaint  ?Patient presents with  ? Weakness  ? ?HPI: [name] is a 46 y.o. female with medical history significant of ovarian cancer, colon cancer, liver metastasis, lung metastasis, anxiety, anemia, GERD, depression/anxiety, and more presents to ED with a chief complaint of malaise.  Patient reports that she has been battling cancer for 8 years.  She was recently restarted on the chemotherapy that she has had in the beginning of her treatments.  She is currently undergoing chemo with chemo pump at bedside.  Her last treatment before this was March 6-9.  She is due to have this 1 and on March 22.  Patient reports with her last chemo, she went to work the next day and was shivering.  She was nauseous but did not throw up.  She ended up having a fever at that time.  She was diagnosed with a UTI-E. coli.  This time she reports that she is thrown up several times at home.  Nonbloody liquid emesis.  She reports that started the same day chemo started, which was March 20.  She checked her temperature at home and had no fever, but still felt like something was wrong so she came into the ED.  She reports no abdominal pain or diarrhea associated with her nausea and vomiting.  She has a cough but it is relatively unchanged, and related to her GERD.  Patient has no frequency, urgency, or dysuria, but did not have those symptoms with her last UTI either.  She has no new skin rashes, or other infectious symptoms.  Patient has no other complaints at this time. ? ?Patient follows with oncology in [place]. ? ?Patient does not smoke, does not drink alcohol, does not use illicit drugs.  She is not vaccinated for COVID.  Patient is full code. ?Review of Systems: As mentioned in the history  of present illness. All other systems reviewed and are negative. ?Past Medical History:  ?Diagnosis Date  ? ADD (attention deficit disorder)   ? Anemia, iron deficiency   ? Anxiety   ? Colon cancer ([place]) 08/2013  ? Depression   ? Difficulty swallowing pills   ? GERD (gastroesophageal reflux disease)   ? Headache   ? migraines "long time ago"  ? History of blood transfusion 08/16/2013  ? History of migraine   ? no problems in "a long time"  ? 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 Restless leg syndrome   ? Seizures ([place])   ? last seizure 08/2012  ? ?Past Surgical History:  ?Procedure Laterality Date  ? ABDOMINAL HYSTERECTOMY  08/24/2004  ? partial  ? APPENDECTOMY  08/24/2004  ? COLON SURGERY  08/11/2013  ? removed a foot of colon  ? COLONOSCOPY  2019  ? colonoscopy 10-18-14    ? COLONOSCOPY WITH PROPOFOL  07/07/2013  ? CYSTOSCOPY  11/01/2003  ? ENDOMETRIAL FULGURATION  11/01/2003  ? ESOPHAGOGASTRODUODENOSCOPY  07/07/2013  ? IR ANGIOGRAM SELECTIVE EACH ADDITIONAL VESSEL  11/03/2020  ? IR ANGIOGRAM SELECTIVE EACH ADDITIONAL VESSEL  11/03/2020  ? IR ANGIOGRAM SELECTIVE EACH ADDITIONAL VESSEL  11/03/2020  ? IR ANGIOGRAM SELECTIVE EACH ADDITIONAL VESSEL  11/03/2020  ? IR ANGIOGRAM SELECTIVE EACH ADDITIONAL VESSEL  11/17/2020  ? IR ANGIOGRAM SELECTIVE EACH ADDITIONAL VESSEL  11/17/2020  ? IR ANGIOGRAM SELECTIVE EACH ADDITIONAL VESSEL  11/17/2020  ? IR ANGIOGRAM SELECTIVE EACH ADDITIONAL VESSEL  12/08/2020  ? IR ANGIOGRAM SELECTIVE EACH ADDITIONAL VESSEL  12/08/2020  ? IR ANGIOGRAM VISCERAL SELECTIVE  11/03/2020  ? IR ANGIOGRAM VISCERAL SELECTIVE  11/03/2020  ? IR ANGIOGRAM VISCERAL SELECTIVE  11/17/2020  ? IR ANGIOGRAM VISCERAL SELECTIVE  12/08/2020  ? IR EMBO ARTERIAL NOT HEMORR HEMANG INC GUIDE ROADMAPPING  11/03/2020  ? IR EMBO TUMOR ORGAN ISCHEMIA INFARCT INC GUIDE ROADMAPPING  11/17/2020  ? IR EMBO TUMOR ORGAN ISCHEMIA INFARCT INC GUIDE ROADMAPPING  12/08/2020  ? IR IMAGING GUIDED PORT INSERTION  03/05/2019  ? IR  RADIOLOGIST EVAL & MGMT  10/06/2020  ? IR RADIOLOGIST EVAL & MGMT  01/17/2021  ? IR RADIOLOGIST EVAL & MGMT  03/17/2021  ? IR US GUIDE VASC ACCESS RIGHT  11/03/2020  ? IR US GUIDE VASC ACCESS RIGHT  11/17/2020  ? IR US GUIDE VASC ACCESS RIGHT  12/08/2020  ? LAPAROSCOPIC LYSIS OF ADHESIONS  11/01/2003  ? LAPAROSCOPIC PARTIAL COLECTOMY Right 08/11/2013  ? Procedure: LAPAROSCOPIC RIGHT HEMICOLECTOMY;  Surgeon: Stark Klein, MD;  Location: [facility];  Service: General;  Laterality: Right;  ? LAPAROTOMY N/A 12/28/2014  ? Procedure: EXPLORATORY LAPAROTOMY;  Surgeon: Everitt Amber, MD;  Location: [facility];  Service: Gynecology;  Laterality: N/A;  ? LEFT OOPHORECTOMY  08/24/2004  ? PANNICULECTOMY  08/24/2004  ? PORT-A-CATH REMOVAL Left 06/08/2014  ? Procedure: REMOVAL PORT-A-CATH;  Surgeon: Stark Klein, MD;  Location: [facility];  Service: General;  Laterality: Left;  ? PORTACATH PLACEMENT Left 09/09/2013  ? Procedure: INSERTION PORT-A-CATH;  Surgeon: Stark Klein, MD;  Location: [facility];  Service: General;  Laterality: Left;  ? SALPINGOOPHORECTOMY Right 12/28/2014  ? Procedure: RIGHT SALPINGO OOPHORECTOMY;  Surgeon: Everitt Amber, MD;  Location: [facility];  Service: Gynecology;  Laterality: Right;  ? TUBAL LIGATION  11/07/2000  ? UNILATERAL SALPINGECTOMY Left 08/24/2004  ? [place] EXTRACTION Bilateral 1994  ? ?Social History:  reports that she has never smoked. She has never used smokeless tobacco. She reports that she does not drink alcohol and does not use drugs. ? ?Allergies  ?Allergen Reactions  ? Adhesive [Tape] Hives, Shortness Of Breath and Swelling  ?  Tolerates paper tape  ? Clindamycin/Lincomycin Itching, Rash and Other (See Comments)  ?  Generalized body rash with itching, lip ulcers, seizures  ? Promethazine Other (See Comments)  ?  Increase restless leg movement   ? Eggs Or Egg-Derived Products Nausea And Vomiting  ? Metoclopramide Hcl Other (See Comments)  ?  EXACERBATES RESTLESS LEG SYNDROME  ? Oxycodone-Acetaminophen  Hives  ? Sulfa Antibiotics Nausea And Vomiting  ? Fluogen [Influenza Virus Vaccine] Nausea And Vomiting  ? Levaquin [Levofloxacin]   ?  seizures  ? Penicillins Itching  ?  Has patient had a PCN reaction causing immediate rash, facial/tongue/throat swelling, SOB or lightheadedness with hypotension: No ?Has patient had a PCN reaction causing severe rash involving mucus membranes or skin necrosis: No ?Has patient had a PCN reaction that required hospitalization No ?Has patient had a PCN reaction occurring within the last 10 years: No ?If all of the above answers are "NO", then may proceed with Cephalosporin use. ?  ? ? ?Family History  ?Problem Relation Age of Onset  ? Colon polyps Mother   ?     3+ colon polyps at each colonoscopy - "several"  ? Colitis Mother   ? Heart attack Mother   ?  Diabetes Mother   ? Heart disease Mother   ? Diabetes Father   ? Heart attack Father   ? Stroke Father   ? Congestive Heart Failure Father   ? Heart disease Father   ? Cirrhosis Maternal Aunt   ? Lung cancer Maternal Uncle   ?     d. 40s; smoker and worked around asbestos  ? Other Paternal Grandfather   ?     "bone cancer"  ? Colon cancer Maternal Uncle   ?     dx early 29s  ? Breast cancer Maternal Aunt   ?     dx 50s-60s; s/p lumpectomy  ? Dementia Maternal Aunt   ? Other Cousin   ?     maternal 1st cousin w/ "lung issues"  ? Dementia Paternal Aunt   ? Dementia Paternal Uncle   ? Prostate cancer Paternal Uncle 41  ? Esophageal cancer Neg Hx   ? Rectal cancer Neg Hx   ? Stomach cancer Neg Hx   ? ? ?Prior to Admission medications   ?Medication Sig Start Date End Date Taking? Authorizing Provider  ?Accu-Chek Softclix Lancets lancets SMARTSIG:2 Topical Twice Daily 07/22/20   [provider]  ?acetaminophen (TYLENOL) 500 MG tablet Take 1,000 mg by mouth every 6 (six) hours as needed.    [provider]  ?blood glucose meter kit and supplies Dispense based on patient and insurance preference. Use up to four times daily as  directed. (FOR ICD-10 E10.9, E11.9). 07/22/20   [name], Raymond J, MD  ?CINNAMON PO Take 2,000 mg by mouth in the morning and at bedtime.    [provider]  ?diphenhydrAMINE (BENADRYL) 50 MG tablet [place]

## 2021-04-25 NOTE — Assessment & Plan Note (Addendum)
Improved blood glucose readings ?Seen by diabetes coordinator with need for long-acting insulin on discharge ?

## 2021-04-26 ENCOUNTER — Inpatient Hospital Stay: Payer: BC Managed Care – PPO

## 2021-04-26 DIAGNOSIS — R6520 Severe sepsis without septic shock: Secondary | ICD-10-CM | POA: Diagnosis not present

## 2021-04-26 DIAGNOSIS — A419 Sepsis, unspecified organism: Secondary | ICD-10-CM | POA: Diagnosis not present

## 2021-04-26 LAB — GLUCOSE, CAPILLARY
Glucose-Capillary: 215 mg/dL — ABNORMAL HIGH (ref 70–99)
Glucose-Capillary: 211 mg/dL — ABNORMAL HIGH (ref 70–99)
Glucose-Capillary: 216 mg/dL — ABNORMAL HIGH (ref 70–99)
Glucose-Capillary: 305 mg/dL — ABNORMAL HIGH (ref 70–99)
Glucose-Capillary: 293 mg/dL — ABNORMAL HIGH (ref 70–99)
Glucose-Capillary: 329 mg/dL — ABNORMAL HIGH (ref 70–99)

## 2021-04-26 LAB — CBC
HCT: 32.9 % — ABNORMAL LOW (ref 36.0–46.0)
Hemoglobin: 9.9 g/dL — ABNORMAL LOW (ref 12.0–15.0)
MCH: 24.4 pg — ABNORMAL LOW (ref 26.0–34.0)
MCHC: 30.1 g/dL (ref 30.0–36.0)
MCV: 81.2 fL (ref 80.0–100.0)
Platelets: 83 10*3/uL — ABNORMAL LOW (ref 150–400)
RBC: 4.05 MIL/uL (ref 3.87–5.11)
RDW: 20 % — ABNORMAL HIGH (ref 11.5–15.5)
WBC: 4.3 10*3/uL (ref 4.0–10.5)
nRBC: 0 % (ref 0.0–0.2)

## 2021-04-26 LAB — COMPREHENSIVE METABOLIC PANEL
ALT: 35 U/L (ref 0–44)
AST: 53 U/L — ABNORMAL HIGH (ref 15–41)
Albumin: 2.9 g/dL — ABNORMAL LOW (ref 3.5–5.0)
Alkaline Phosphatase: 128 U/L — ABNORMAL HIGH (ref 38–126)
Anion gap: 9 (ref 5–15)
BUN: 15 mg/dL (ref 6–20)
CO2: 20 mmol/L — ABNORMAL LOW (ref 22–32)
Calcium: 8.4 mg/dL — ABNORMAL LOW (ref 8.9–10.3)
Chloride: 105 mmol/L (ref 98–111)
Creatinine, Ser: 0.58 mg/dL (ref 0.44–1.00)
GFR, Estimated: >60 mL/min (ref >60)
Glucose, Bld: 218 mg/dL — ABNORMAL HIGH (ref 70–99)
Potassium: 3.4 mmol/L — ABNORMAL LOW (ref 3.5–5.1)
Sodium: 134 mmol/L — ABNORMAL LOW (ref 135–145)
Total Bilirubin: 1.7 mg/dL — ABNORMAL HIGH (ref 0.3–1.2)
Total Protein: 6.4 g/dL — ABNORMAL LOW (ref 6.5–8.1)

## 2021-04-26 LAB — URINE CULTURE: Culture: NO GROWTH

## 2021-04-26 MED ORDER — SODIUM CHLORIDE 0.9% FLUSH
10.0000 mL | Freq: Once | INTRAVENOUS | Status: AC
  Administered 2021-04-26: 10 mL via INTRAVENOUS

## 2021-04-26 MED ORDER — SODIUM CHLORIDE 0.9 % IV SOLN
2.0000 g | Freq: Two times a day (BID) | INTRAVENOUS | Status: DC
  Administered 2021-04-26: 2 g via INTRAVENOUS
  Filled 2021-04-26: qty 2

## 2021-04-26 MED ORDER — SODIUM CHLORIDE 0.9 % IV SOLN: 2.0000 g | Freq: Three times a day (TID) | INTRAVENOUS | Status: DC

## 2021-04-26 MED ORDER — SIMETHICONE 80 MG PO CHEW
80.0000 mg | CHEWABLE_TABLET | Freq: Once | ORAL | Status: AC
Start: 2021-04-26 — End: 2021-04-26
  Administered 2021-04-26: 80 mg via ORAL
  Filled 2021-04-26: qty 1

## 2021-04-26 MED ORDER — HEPARIN SOD (PORK) LOCK FLUSH 100 UNIT/ML IV SOLN
INTRAVENOUS | Status: AC
  Administered 2021-04-26: 500 [IU] via INTRAVENOUS
  Filled 2021-04-26: qty 5

## 2021-04-26 MED ORDER — POLYETHYLENE GLYCOL 3350 17 G PO PACK
17.0000 g | PACK | Freq: Once | ORAL | Status: AC
  Administered 2021-04-26: 17 g via ORAL
  Filled 2021-04-26: qty 1

## 2021-04-26 MED ORDER — HEPARIN SOD (PORK) LOCK FLUSH 100 UNIT/ML IV SOLN: 500.0000 [IU] | Freq: Once | INTRAVENOUS | Status: AC

## 2021-04-26 MED ORDER — CALCIUM CARBONATE ANTACID 500 MG PO CHEW
1.0000 | CHEWABLE_TABLET | Freq: Three times a day (TID) | ORAL | Status: DC
  Administered 2021-04-26 – 2021-04-28 (×6): 200 mg via ORAL
  Filled 2021-04-26 (×6): qty 1

## 2021-04-26 MED ORDER — LORAZEPAM 0.5 MG PO TABS
0.5000 mg | ORAL_TABLET | Freq: Four times a day (QID) | ORAL | Status: DC | PRN
  Administered 2021-04-26 – 2021-04-27 (×3): 0.5 mg via ORAL
  Filled 2021-04-26 (×3): qty 1

## 2021-04-26 MED ORDER — POTASSIUM CHLORIDE CRYS ER 20 MEQ PO TBCR
40.0000 meq | EXTENDED_RELEASE_TABLET | Freq: Once | ORAL | Status: AC
  Administered 2021-04-26: 40 meq via ORAL
  Filled 2021-04-26: qty 2

## 2021-04-26 NOTE — Assessment & Plan Note (Addendum)
Fevers have resolved ?Blood cultures with no growth ?Urine cultures with no growth ?Continue to monitor ?

## 2021-04-26 NOTE — Hospital Course (Addendum)
Per HPI: ?[name] is a 46 y.o. female with medical history significant of ovarian cancer, colon cancer, liver metastasis, lung metastasis, anxiety, anemia, GERD, depression/anxiety, and more presents to ED with a chief complaint of malaise.  Patient reports that she has been battling cancer for 8 years.  She was recently restarted on the chemotherapy that she has had in the beginning of her treatments.  She is currently undergoing chemo with chemo pump at bedside.  Her last treatment before this was March 6-9.  She is due to have this 1 and on March 22.  Patient reports with her last chemo, she went to work the next day and was shivering.  She was nauseous but did not throw up.  She ended up having a fever at that time.  She was diagnosed with a UTI-E. coli.  This time she reports that she is thrown up several times at home.  Nonbloody liquid emesis.  She reports that started the same day chemo started, which was March 20.  She checked her temperature at home and had no fever, but still felt like something was wrong so she came into the ED.  She reports no abdominal pain or diarrhea associated with her nausea and vomiting.  She has a cough but it is relatively unchanged, and related to her GERD.  Patient has no frequency, urgency, or dysuria, but did not have those symptoms with her last UTI either.  She has no new skin rashes, or other infectious symptoms.  Patient has no other complaints at this time. ? ?04/26/21: Patient was admitted with sepsis present on admission with possible UTI.  Blood cultures are negative and urine cultures are pending.  She was noted to have fever and has been seen by oncology and this may be related to her chemotherapy regimen.  She remains on IV antibiotics with improving fever and symptoms noted. ? ?04/27/21: Urine cultures and blood cultures remain negative and fevers are improving.  Cefepime will be discontinued today.  She continues to have some ongoing nausea and poor  appetite.  Continue to monitor and anticipate discharge in next 24 hours if further improved. ? ?04/28/2021: Patient's GI symptoms have improved and she is stable for discharge today white blood cell counts have been noted to be low and therefore she will receive some Neupogen prior to discharge.  She will remain on medications as otherwise noted below.  No other acute events during the course of this admission. ?

## 2021-04-26 NOTE — Assessment & Plan Note (Addendum)
Discussed case with Dr. Benay Spice her oncologist ?5-FU pump discontinued 3/22 ?

## 2021-04-26 NOTE — Progress Notes (Signed)
?PROGRESS NOTE ? ? ? ?[name]  TLX:[id number] DOB: Nov 29, 1975 DOA: 04/24/2021 ?PCP: [name], Raymond J, MD ? ? ?Brief Narrative:  ?Per HPI: ?[name] is a 46 y.o. female with medical history significant of ovarian cancer, colon cancer, liver metastasis, lung metastasis, anxiety, anemia, GERD, depression/anxiety, and more presents to ED with a chief complaint of malaise.  Patient reports that she has been battling cancer for 8 years.  She was recently restarted on the chemotherapy that she has had in the beginning of her treatments.  She is currently undergoing chemo with chemo pump at bedside.  Her last treatment before this was March 6-9.  She is due to have this 1 and on March 22.  Patient reports with her last chemo, she went to work the next day and was shivering.  She was nauseous but did not throw up.  She ended up having a fever at that time.  She was diagnosed with a UTI-E. coli.  This time she reports that she is thrown up several times at home.  Nonbloody liquid emesis.  She reports that started the same day chemo started, which was March 20.  She checked her temperature at home and had no fever, but still felt like something was wrong so she came into the ED.  She reports no abdominal pain or diarrhea associated with her nausea and vomiting.  She has a cough but it is relatively unchanged, and related to her GERD.  Patient has no frequency, urgency, or dysuria, but did not have those symptoms with her last UTI either.  She has no new skin rashes, or other infectious symptoms.  Patient has no other complaints at this time. ? ?04/26/21: Patient was admitted with sepsis present on admission with possible UTI.  Blood cultures are negative and urine cultures are pending.  She was noted to have fever and has been seen by oncology and this may be related to her chemotherapy regimen.  She remains on IV antibiotics with improving fever and symptoms noted.  ? ? ?Assessment & Plan: ?  ?Principal  Problem: ?  Sepsis ([place]) ?Active Problems: ?  Metastatic colon cancer in female [facility]) ?  Fever, unspecified ?  DEPRESSION/ANXIETY ?  GERD ?  Anemia due to antineoplastic chemotherapy ?  Seizure ([place]) ?  Diabetes mellitus, type 2 ([place]) ? ?Assessment and Plan: ?* Sepsis ([place]) ?Patient was febrile, tachycardic, tachypneic on arrival ?Evidence of endorgan damage with lactic acidosis initially 3.6, then 2.9 ?Unclear source with no growth on blood cultures and urine cultures pending ?Negative respiratory panel ?UA is borderline-urine culture pending -this is most likely source as patient recently had E. coli UTI ?Chest x-ray shows no evidence of acute cardiopulmonary disease ?Patient was started on cefepime in the ED, given 1 L LR, 1 L NS, and Tylenol ?Patient remained febrile and was given Toradol at admission ?Follow-up urine cultures ? ?Fever, unspecified ?Tmax of 100.4 Fahrenheit overnight ?Blood cultures with no growth ?Continue to monitor ? ?Metastatic colon cancer in female [facility]) ?Discussed case with Dr. Benay Spice her oncologist ?Plan to stop 5-FU pump today ? ?Diabetes mellitus, type 2 ([place]) ?Improved blood glucose readings ?Seen by diabetes coordinator with need for long-acting insulin on discharge ? ?Seizure ([place]) ?Continue Vimpat ? ?Anemia due to antineoplastic chemotherapy ?Currently stable, continue B12 and iron ? ?GERD ?Patient reports that Pepcid and omeprazole has not been working for her at home ?Continue Protonix ?GI cocktail every 6 hours as needed ? ?DEPRESSION/ANXIETY ?Continue  Vistaril and Ativan ? ? ? ?DVT prophylaxis: Heparin ?Code Status: Full ?Family Communication: None at bedside ?Disposition Plan:  ?Status is: Inpatient ?Remains inpatient appropriate because: Need for IV medications. ? ?Consultants:  ?Oncology ? ?Procedures:  ?See below ? ?Antimicrobials:  ?Anti-infectives (From admission, onward)  ? ? Start     Dose/Rate Route Frequency Ordered Stop  ? 04/26/21 2000  ceFEPIme (MAXIPIME) 2 g in  sodium chloride 0.9 % 100 mL IVPB       ? 2 g ?200 mL/hr over 30 Minutes Intravenous Every 12 hours 04/26/21 0922    ? 04/26/21 1400  ceFEPIme (MAXIPIME) 2 g in sodium chloride 0.9 % 100 mL IVPB  Status:  Discontinued       ? 2 g ?200 mL/hr over 30 Minutes Intravenous Every 8 hours 04/26/21 0920 04/26/21 0922  ? 04/25/21 1200  ceFEPIme (MAXIPIME) 2 g in sodium chloride 0.9 % 100 mL IVPB  Status:  Discontinued       ? 2 g ?200 mL/hr over 30 Minutes Intravenous Every 12 hours 04/25/21 0250 04/26/21 0920  ? 04/25/21 0015  ceFEPIme (MAXIPIME) 2 g in sodium chloride 0.9 % 100 mL IVPB       ? 2 g ?200 mL/hr over 30 Minutes Intravenous  Once 04/25/21 0008 04/25/21 0104  ? ?  ? ? ?Subjective: ?Patient seen and evaluated today with ongoing mild fever and malaise.  She has not had much to eat.  No acute overnight events noted. ? ?Objective: ?Vitals:  ? 04/25/21 1030 04/25/21 1338 04/25/21 2118 04/25/21 2200  ?BP:  118/72 115/70   ?Pulse:  95 97   ?Resp:  18 20   ?Temp: 99.5 ?F (37.5 ?C) 99 ?F (37.2 ?C) (!) 100.4 ?F (38 ?C) 99 ?F (37.2 ?C)  ?TempSrc: Oral Oral Oral Oral  ?SpO2:  99% 98%   ?Weight:      ?Height:      ? ? ?Intake/Output Summary (Last 24 hours) at 04/26/2021 1059 ?Last data filed at 04/25/2021 2100 ?Gross per 24 hour  ?Intake 1089.11 ml  ?Output --  ?Net 1089.11 ml  ? ?Filed Weights  ? 04/24/21 2016 04/25/21 0336  ?Weight: 69.8 kg 71 kg  ? ? ?Examination: ? ?General exam: Appears calm and comfortable  ?Respiratory system: Clear to auscultation. Respiratory effort normal. ?Cardiovascular system: S1 & S2 heard, RRR.  ?Gastrointestinal system: Abdomen is soft ?Central nervous system: Alert and awake ?Extremities: No edema ?Skin: No significant lesions noted ?Psychiatry: Flat affect. ? ? ? ?Data Reviewed: I have personally reviewed following labs and imaging studies ? ?CBC: ?Recent Labs  ?Lab 04/24/21 ?0850 04/24/21 ?2043 04/25/21 ?9390 04/26/21 ?0540  ?WBC 4.6 7.8 8.7 4.3  ?NEUTROABS 3.2 6.8 7.6  --   ?HGB 10.3* 10.7*  9.9* 9.9*  ?HCT 34.1* 35.0* 34.6* 32.9*  ?MCV 77.7* 79.9* 80.3 81.2  ?PLT 90* 86* 95* 83*  ? ?Basic Metabolic Panel: ?Recent Labs  ?Lab 04/24/21 ?0850 04/24/21 ?2043 04/25/21 ?3009 04/26/21 ?0540  ?NA 136 133* 135 134*  ?K 3.9 4.1 4.7 3.4*  ?CL 102 101 104 105  ?CO2 24 20* 22 20*  ?GLUCOSE 362* 461* 297* 218*  ?BUN '10 11 13 15  '$ ?CREATININE 0.50 0.73 0.58 0.58  ?CALCIUM 9.7 9.3 8.7* 8.4*  ?MG  --   --  1.6*  --   ?PHOS  --   --  3.5  --   ? ?GFR: ?Estimated Creatinine Clearance: 81.4 mL/min (by C-G formula based on SCr of 0.58  mg/dL). ?Liver Function Tests: ?Recent Labs  ?Lab 04/24/21 ?0850 04/24/21 ?2043 04/25/21 ?8366 04/26/21 ?0540  ?AST 33 79* 51* 53*  ?ALT 22 42 35 35  ?ALKPHOS 173* 187* 158* 128*  ?BILITOT 1.2 1.4* 1.5* 1.7*  ?PROT 7.3 7.8 6.8 6.4*  ?ALBUMIN 3.7 3.6 3.2* 2.9*  ? ?No results for input(s): LIPASE, AMYLASE in the last 168 hours. ?No results for input(s): AMMONIA in the last 168 hours. ?Coagulation Profile: ?Recent Labs  ?Lab 04/24/21 ?2043 04/25/21 ?2947  ?INR 1.5* 1.7*  ? ?Cardiac Enzymes: ?No results for input(s): CKTOTAL, CKMB, CKMBINDEX, TROPONINI in the last 168 hours. ?BNP (last 3 results) ?No results for input(s): PROBNP in the last 8760 hours. ?HbA1C: ?No results for input(s): HGBA1C in the last 72 hours. ?CBG: ?Recent Labs  ?Lab 04/25/21 ?2122 04/26/21 ?0105 04/26/21 ?6546 04/26/21 ?5035 04/26/21 ?1057  ?GLUCAP 236* 215* 211* 216* 305*  ? ?Lipid Profile: ?No results for input(s): CHOL, HDL, LDLCALC, TRIG, CHOLHDL, LDLDIRECT in the last 72 hours. ?Thyroid Function Tests: ?No results for input(s): TSH, T4TOTAL, FREET4, T3FREE, THYROIDAB in the last 72 hours. ?Anemia Panel: ?No results for input(s): VITAMINB12, FOLATE, FERRITIN, TIBC, IRON, RETICCTPCT in the last 72 hours. ?Sepsis Labs: ?Recent Labs  ?Lab 04/24/21 ?2043 04/24/21 ?2245 04/25/21 ?4656  ?PROCALCITON  --   --  2.14  ?LATICACIDVEN 3.6* 2.9* 2.3*  ? ? ?Recent Results (from the past 240 hour(s))  ?Culture, blood (Routine x 2)      Status: None (Preliminary result)  ? Collection Time: 04/24/21  8:43 PM  ? Specimen: Left Antecubital; Blood  ?Result Value Ref Range Status  ? Specimen Description LEFT ANTECUBITAL  Final  ? Special Requests

## 2021-04-26 NOTE — Progress Notes (Signed)
Patient is admitted to the unit and still had her 40f running.  Her pump had 0.2 remaining.  Pump was primed and all infused before turning pump off.  Port was flushed and needle was removed.  Patient tolerated well.  She [facility]usystem [facility]  She is aware that [facility] will [facility].  A copy of her schedule was provided to her.  She verbalizes understanding.   ?

## 2021-04-27 DIAGNOSIS — R6520 Severe sepsis without septic shock: Secondary | ICD-10-CM | POA: Diagnosis not present

## 2021-04-27 DIAGNOSIS — A419 Sepsis, unspecified organism: Secondary | ICD-10-CM | POA: Diagnosis not present

## 2021-04-27 LAB — GLUCOSE, CAPILLARY
Glucose-Capillary: 226 mg/dL — ABNORMAL HIGH (ref 70–99)
Glucose-Capillary: 262 mg/dL — ABNORMAL HIGH (ref 70–99)
Glucose-Capillary: 270 mg/dL — ABNORMAL HIGH (ref 70–99)
Glucose-Capillary: 264 mg/dL — ABNORMAL HIGH (ref 70–99)

## 2021-04-27 MED ORDER — POLYETHYLENE GLYCOL 3350 17 G PO PACK
17.0000 g | PACK | Freq: Every day | ORAL | Status: DC | PRN
  Administered 2021-04-27: 17 g via ORAL
  Filled 2021-04-27: qty 1

## 2021-04-27 MED ORDER — SCOPOLAMINE 1 MG/3DAYS TD PT72
1.0000 | MEDICATED_PATCH | TRANSDERMAL | Status: DC
  Administered 2021-04-27: 1.5 mg via TRANSDERMAL
  Filled 2021-04-27: qty 1

## 2021-04-27 MED ORDER — SIMETHICONE 80 MG PO CHEW
80.0000 mg | CHEWABLE_TABLET | Freq: Four times a day (QID) | ORAL | Status: DC | PRN
  Administered 2021-04-27 (×2): 80 mg via ORAL
  Filled 2021-04-27 (×2): qty 1

## 2021-04-27 NOTE — Progress Notes (Signed)
?PROGRESS NOTE ? ? ? ?[name]  VHQ:[id number] DOB: 13-Mar-1975 DOA: 04/24/2021 ?PCP: [name], Raymond J, MD ? ? ?Brief Narrative:  ?Per HPI: ?[name] is a 46 y.o. female with medical history significant of ovarian cancer, colon cancer, liver metastasis, lung metastasis, anxiety, anemia, GERD, depression/anxiety, and more presents to ED with a chief complaint of malaise.  Patient reports that she has been battling cancer for 8 years.  She was recently restarted on the chemotherapy that she has had in the beginning of her treatments.  She is currently undergoing chemo with chemo pump at bedside.  Her last treatment before this was March 6-9.  She is due to have this 1 and on March 22.  Patient reports with her last chemo, she went to work the next day and was shivering.  She was nauseous but did not throw up.  She ended up having a fever at that time.  She was diagnosed with a UTI-E. coli.  This time she reports that she is thrown up several times at home.  Nonbloody liquid emesis.  She reports that started the same day chemo started, which was March 20.  She checked her temperature at home and had no fever, but still felt like something was wrong so she came into the ED.  She reports no abdominal pain or diarrhea associated with her nausea and vomiting.  She has a cough but it is relatively unchanged, and related to her GERD.  Patient has no frequency, urgency, or dysuria, but did not have those symptoms with her last UTI either.  She has no new skin rashes, or other infectious symptoms.  Patient has no other complaints at this time. ? ?04/26/21: Patient was admitted with sepsis present on admission with possible UTI.  Blood cultures are negative and urine cultures are pending.  She was noted to have fever and has been seen by oncology and this may be related to her chemotherapy regimen.  She remains on IV antibiotics with improving fever and symptoms noted. ? ?04/27/21: Urine cultures and blood cultures  remain negative and fevers are improving.  Cefepime will be discontinued today.  She continues to have some ongoing nausea and poor appetite.  Continue to monitor and anticipate discharge in next 24 hours if further improved.  ? ? ?Assessment & Plan: ?  ?Principal Problem: ?  Sepsis ([place]) ?Active Problems: ?  Metastatic colon cancer in female [facility]) ?  Fever, unspecified ?  DEPRESSION/ANXIETY ?  GERD ?  Anemia due to antineoplastic chemotherapy ?  Seizure ([place]) ?  Diabetes mellitus, type 2 ([place]) ? ?Assessment and Plan: ?* Sepsis ([place]) ?Patient was febrile, tachycardic, tachypneic on arrival ?Evidence of endorgan damage with lactic acidosis initially 3.6, then 2.9 ?Unclear source with no growth on blood cultures and urine cultures pending ?Negative respiratory panel ?UA is borderline-urine culture pending -this is most likely source as patient recently had E. coli UTI ?Chest x-ray shows no evidence of acute cardiopulmonary disease ?Patient was started on cefepime in the ED, given 1 L LR, 1 L NS, and Tylenol ?Patient remained febrile and was given Toradol at admission ?Follow-up urine cultures negative ?Cefepime discontinued 3/22 ? ?Fever, unspecified ?Fevers have resolved ?Blood cultures with no growth ?Urine cultures with no growth ?Continue to monitor ? ?Metastatic colon cancer in female [facility]) ?Discussed case with Dr. Benay Spice her oncologist ?5-FU pump discontinued 3/22 ? ?Diabetes mellitus, type 2 ([place]) ?Improved blood glucose readings ?Seen by diabetes coordinator with need for  long-acting insulin on discharge ? ?Seizure ([place]) ?Continue Vimpat ? ?Anemia due to antineoplastic chemotherapy ?Currently stable, continue B12 and iron ? ?GERD ?Patient reports that Pepcid and omeprazole has not been working for her at home ?Continue Protonix ?GI cocktail every 6 hours as needed ? ?DEPRESSION/ANXIETY ?Continue Vistaril and Ativan ? ? ? ?DVT prophylaxis: Heparin ?Code Status: Full ?Family Communication: None at  bedside ?Disposition Plan:  ?Status is: Inpatient ?Remains inpatient appropriate because: Need for IV medications. ?  ?Consultants:  ?Oncology ?  ?Procedures:  ?See below ?  ?Antimicrobials:  ?Anti-infectives (From admission, onward)  ? ? Start     Dose/Rate Route Frequency Ordered Stop  ? 04/26/21 2000  ceFEPIme (MAXIPIME) 2 g in sodium chloride 0.9 % 100 mL IVPB  Status:  Discontinued       ? 2 g ?200 mL/hr over 30 Minutes Intravenous Every 12 hours 04/26/21 0922 04/27/21 0718  ? 04/26/21 1400  ceFEPIme (MAXIPIME) 2 g in sodium chloride 0.9 % 100 mL IVPB  Status:  Discontinued       ? 2 g ?200 mL/hr over 30 Minutes Intravenous Every 8 hours 04/26/21 0920 04/26/21 0922  ? 04/25/21 1200  ceFEPIme (MAXIPIME) 2 g in sodium chloride 0.9 % 100 mL IVPB  Status:  Discontinued       ? 2 g ?200 mL/hr over 30 Minutes Intravenous Every 12 hours 04/25/21 0250 04/26/21 0920  ? 04/25/21 0015  ceFEPIme (MAXIPIME) 2 g in sodium chloride 0.9 % 100 mL IVPB       ? 2 g ?200 mL/hr over 30 Minutes Intravenous  Once 04/25/21 0008 04/25/21 0104  ? ?  ? ? ?Subjective: ?Patient seen and evaluated today with ongoing nausea and abdominal bloating.  She is overall not feeling too well and not tolerating much of her diet. ? ?Objective: ?Vitals:  ? 04/25/21 2200 04/26/21 1300 04/26/21 2115 04/27/21 0527  ?BP:  115/74 125/78 109/66  ?Pulse:  92 96 88  ?Resp:  17 (!) 22 17  ?Temp: 99 ?F (37.2 ?C) 100.1 ?F (37.8 ?C) 99.2 ?F (37.3 ?C) 99 ?F (37.2 ?C)  ?TempSrc: Oral Oral Oral Oral  ?SpO2:  99% 100% 98%  ?Weight:      ?Height:      ? ? ?Intake/Output Summary (Last 24 hours) at 04/27/2021 1018 ?Last data filed at 04/27/2021 0900 ?Gross per 24 hour  ?Intake 769 ml  ?Output --  ?Net 769 ml  ? ?Filed Weights  ? 04/24/21 2016 04/25/21 0336  ?Weight: 69.8 kg 71 kg  ? ? ?Examination: ? ?General exam: Appears calm and comfortable  ?Respiratory system: Clear to auscultation. Respiratory effort normal. ?Cardiovascular system: S1 & S2 heard, RRR.   ?Gastrointestinal system: Abdomen is soft ?Central nervous system: Alert and awake ?Extremities: No edema ?Skin: No significant lesions noted ?Psychiatry: Flat affect. ? ? ? ?Data Reviewed: I have personally reviewed following labs and imaging studies ? ?CBC: ?Recent Labs  ?Lab 04/24/21 ?0850 04/24/21 ?2043 04/25/21 ?8416 04/26/21 ?0540  ?WBC 4.6 7.8 8.7 4.3  ?NEUTROABS 3.2 6.8 7.6  --   ?HGB 10.3* 10.7* 9.9* 9.9*  ?HCT 34.1* 35.0* 34.6* 32.9*  ?MCV 77.7* 79.9* 80.3 81.2  ?PLT 90* 86* 95* 83*  ? ?Basic Metabolic Panel: ?Recent Labs  ?Lab 04/24/21 ?0850 04/24/21 ?2043 04/25/21 ?6063 04/26/21 ?0540  ?NA 136 133* 135 134*  ?K 3.9 4.1 4.7 3.4*  ?CL 102 101 104 105  ?CO2 24 20* 22 20*  ?GLUCOSE 362* 461* 297* 218*  ?  BUN '10 11 13 15  '$ ?CREATININE 0.50 0.73 0.58 0.58  ?CALCIUM 9.7 9.3 8.7* 8.4*  ?MG  --   --  1.6*  --   ?PHOS  --   --  3.5  --   ? ?GFR: ?Estimated Creatinine Clearance: 81.4 mL/min (by C-G formula based on SCr of 0.58 mg/dL). ?Liver Function Tests: ?Recent Labs  ?Lab 04/24/21 ?0850 04/24/21 ?2043 04/25/21 ?3614 04/26/21 ?0540  ?AST 33 79* 51* 53*  ?ALT 22 42 35 35  ?ALKPHOS 173* 187* 158* 128*  ?BILITOT 1.2 1.4* 1.5* 1.7*  ?PROT 7.3 7.8 6.8 6.4*  ?ALBUMIN 3.7 3.6 3.2* 2.9*  ? ?No results for input(s): LIPASE, AMYLASE in the last 168 hours. ?No results for input(s): AMMONIA in the last 168 hours. ?Coagulation Profile: ?Recent Labs  ?Lab 04/24/21 ?2043 04/25/21 ?4315  ?INR 1.5* 1.7*  ? ?Cardiac Enzymes: ?No results for input(s): CKTOTAL, CKMB, CKMBINDEX, TROPONINI in the last 168 hours. ?BNP (last 3 results) ?No results for input(s): PROBNP in the last 8760 hours. ?HbA1C: ?No results for input(s): HGBA1C in the last 72 hours. ?CBG: ?Recent Labs  ?Lab 04/26/21 ?0723 04/26/21 ?1057 04/26/21 ?1715 04/26/21 ?2127 04/27/21 ?0751  ?GLUCAP 216* 305* 293* 329* 226*  ? ?Lipid Profile: ?No results for input(s): CHOL, HDL, LDLCALC, TRIG, CHOLHDL, LDLDIRECT in the last 72 hours. ?Thyroid Function Tests: ?No results for  input(s): TSH, T4TOTAL, FREET4, T3FREE, THYROIDAB in the last 72 hours. ?Anemia Panel: ?No results for input(s): VITAMINB12, FOLATE, FERRITIN, TIBC, IRON, RETICCTPCT in the last 72 hours. ?Sepsis Labs: ?Recent Labs  ?Lab 04/24/21

## 2021-04-27 NOTE — Progress Notes (Signed)
Inpatient Diabetes Program Recommendations ? ?AACE/ADA: New Consensus Statement on Inpatient Glycemic Control (2015) ? ?Target Ranges:  Prepandial:   less than 140 mg/dL ?     Peak postprandial:   less than 180 mg/dL (1-2 hours) ?     Critically ill patients:  140 - 180 mg/dL  ? ?Lab Results  ?Component Value Date  ? GLUCAP 262 (H) 04/27/2021  ? HGBA1C 9.8 (H) 03/16/2021  ? ? ?Review of Glycemic Control ? Latest Reference Range & Units 04/26/21 07:23 04/26/21 10:57 04/26/21 17:15 04/26/21 21:27 04/27/21 07:51 04/27/21 11:05  ?Glucose-Capillary 70 - 99 mg/dL 216 (H) 305 (H) 293 (H) 329 (H) 226 (H) 262 (H)  ? ?Diabetes history: Type 2 Dm ?Outpatient Diabetes medications: Metformin 500 mg BID ?Current orders for Inpatient glycemic control:  ?Semglee 15 units Daily ?Novolog 0-15 units TID & HS ? ?Inpatient Diabetes Program Recommendations:   ? ?-   Increase Semglee to 20 units ?-  Add Novolog 4 units tid meal coverage if eating >50% of meals  ? ?Thanks, ? ?Tama Headings RN, MSN, BC-ADM ?Inpatient Diabetes Coordinator ?Team Pager [phone] (8a-5p) ? ? ? ? ?

## 2021-04-27 NOTE — Plan of Care (Signed)
?  Problem: Education: Goal: Knowledge of General Education information will improve Description: Including pain rating scale, medication(s)/side effects and non-pharmacologic comfort measures Outcome: Progressing   Problem: Health Behavior/Discharge Planning: Goal: Ability to manage health-related needs will improve Outcome: Progressing   Problem: Nutrition: Goal: Adequate nutrition will be maintained Outcome: Progressing   Problem: Coping: Goal: Level of anxiety will decrease Outcome: Progressing   Problem: Elimination: Goal: Will not experience complications related to bowel motility Outcome: Progressing Goal: Will not experience complications related to urinary retention Outcome: Progressing   Problem: Pain Managment: Goal: General experience of comfort will improve Outcome: Progressing   Problem: Safety: Goal: Ability to remain free from injury will improve Outcome: Progressing   

## 2021-04-27 NOTE — Plan of Care (Signed)
  Problem: Education: Goal: Knowledge of General Education information will improve Description: Including pain rating scale, medication(s)/side effects and non-pharmacologic comfort measures Outcome: Progressing   Problem: Health Behavior/Discharge Planning: Goal: Ability to manage health-related needs will improve Outcome: Progressing   Problem: Clinical Measurements: Goal: Will remain free from infection Outcome: Progressing   

## 2021-04-27 NOTE — Progress Notes (Deleted)
I spoke with Dr. Gearldine Shown office and because as of 3/23 patient is still inpatient and not expect to discharge until at least 3/24, he does NOT want patient to get her Udenyca injection that is scheduled for 3/24.  I have cancelled this appointment.  I spoke with Loree Fee, RN caring for patient at [facility] and advised that I cancelled the appt, please make patient aware for her to follow up with [facility] next week should she have any needs.   ?

## 2021-04-27 NOTE — Progress Notes (Signed)
I spoke with Dr. Gearldine Shown office and because as of 3/23 patient is still inpatient and not expect to discharge until at least 3/24, he does NOT want patient to get her Udenyca injection that is scheduled for 3/24.  I have cancelled this appointment.  I spoke with Loree Fee, RN caring for patient at [facility] and advised that I cancelled the appt, please make patient aware for her to follow up with [facility] next week should she have any needs.   ?

## 2021-04-28 ENCOUNTER — Inpatient Hospital Stay (HOSPITAL_COMMUNITY): Payer: BC Managed Care – PPO | Attending: Hematology

## 2021-04-28 ENCOUNTER — Encounter (HOSPITAL_COMMUNITY): Payer: Self-pay

## 2021-04-28 ENCOUNTER — Other Ambulatory Visit: Payer: Self-pay

## 2021-04-28 ENCOUNTER — Inpatient Hospital Stay (HOSPITAL_COMMUNITY): Payer: BC Managed Care – PPO

## 2021-04-28 VITALS — BP 115/75 | HR 97 | Temp 98.9°F | Resp 18

## 2021-04-28 DIAGNOSIS — R6520 Severe sepsis without septic shock: Secondary | ICD-10-CM | POA: Diagnosis not present

## 2021-04-28 DIAGNOSIS — C189 Malignant neoplasm of colon, unspecified: Secondary | ICD-10-CM

## 2021-04-28 DIAGNOSIS — C182 Malignant neoplasm of ascending colon: Secondary | ICD-10-CM | POA: Insufficient documentation

## 2021-04-28 DIAGNOSIS — Z5189 Encounter for other specified aftercare: Secondary | ICD-10-CM | POA: Insufficient documentation

## 2021-04-28 DIAGNOSIS — A419 Sepsis, unspecified organism: Secondary | ICD-10-CM | POA: Diagnosis not present

## 2021-04-28 LAB — CBC
HCT: 29.4 % — ABNORMAL LOW (ref 36.0–46.0)
Hemoglobin: 8.7 g/dL — ABNORMAL LOW (ref 12.0–15.0)
MCH: 23 pg — ABNORMAL LOW (ref 26.0–34.0)
MCHC: 29.6 g/dL — ABNORMAL LOW (ref 30.0–36.0)
MCV: 77.6 fL — ABNORMAL LOW (ref 80.0–100.0)
Platelets: 78 10*3/uL — ABNORMAL LOW (ref 150–400)
RBC: 3.79 MIL/uL — ABNORMAL LOW (ref 3.87–5.11)
RDW: 19.3 % — ABNORMAL HIGH (ref 11.5–15.5)
WBC: 1.4 10*3/uL — CL (ref 4.0–10.5)
nRBC: 0 % (ref 0.0–0.2)

## 2021-04-28 LAB — COMPREHENSIVE METABOLIC PANEL
ALT: 23 U/L (ref 0–44)
AST: 29 U/L (ref 15–41)
Albumin: 2.9 g/dL — ABNORMAL LOW (ref 3.5–5.0)
Alkaline Phosphatase: 111 U/L (ref 38–126)
Anion gap: 7 (ref 5–15)
BUN: 8 mg/dL (ref 6–20)
CO2: 23 mmol/L (ref 22–32)
Calcium: 8.5 mg/dL — ABNORMAL LOW (ref 8.9–10.3)
Chloride: 104 mmol/L (ref 98–111)
Creatinine, Ser: 0.43 mg/dL — ABNORMAL LOW (ref 0.44–1.00)
GFR, Estimated: >60 mL/min (ref >60)
Glucose, Bld: 260 mg/dL — ABNORMAL HIGH (ref 70–99)
Potassium: 3.5 mmol/L (ref 3.5–5.1)
Sodium: 134 mmol/L — ABNORMAL LOW (ref 135–145)
Total Bilirubin: 1.4 mg/dL — ABNORMAL HIGH (ref 0.3–1.2)
Total Protein: 6.5 g/dL (ref 6.5–8.1)

## 2021-04-28 LAB — CBC WITH DIFFERENTIAL/PLATELET
Abs Immature Granulocytes: 0.01 10*3/uL (ref 0.00–0.07)
Basophils Absolute: 0 10*3/uL (ref 0.0–0.1)
Basophils Relative: 1 %
Eosinophils Absolute: 0 10*3/uL (ref 0.0–0.5)
Eosinophils Relative: 1 %
HCT: 29.4 % — ABNORMAL LOW (ref 36.0–46.0)
Hemoglobin: 9.1 g/dL — ABNORMAL LOW (ref 12.0–15.0)
Immature Granulocytes: 1 %
Lymphocytes Relative: 14 %
Lymphs Abs: 0.2 10*3/uL — ABNORMAL LOW (ref 0.7–4.0)
MCH: 24.5 pg — ABNORMAL LOW (ref 26.0–34.0)
MCHC: 31 g/dL (ref 30.0–36.0)
MCV: 79.2 fL — ABNORMAL LOW (ref 80.0–100.0)
Monocytes Absolute: 0 10*3/uL — ABNORMAL LOW (ref 0.1–1.0)
Monocytes Relative: 1 %
Neutro Abs: 1.2 10*3/uL — ABNORMAL LOW (ref 1.7–7.7)
Neutrophils Relative %: 82 %
Platelets: 77 10*3/uL — ABNORMAL LOW (ref 150–400)
RBC: 3.71 MIL/uL — ABNORMAL LOW (ref 3.87–5.11)
RDW: 19.4 % — ABNORMAL HIGH (ref 11.5–15.5)
WBC: 1.5 10*3/uL — ABNORMAL LOW (ref 4.0–10.5)
nRBC: 0 % (ref 0.0–0.2)

## 2021-04-28 LAB — GLUCOSE, CAPILLARY: Glucose-Capillary: 241 mg/dL — ABNORMAL HIGH (ref 70–99)

## 2021-04-28 LAB — MAGNESIUM: Magnesium: 2 mg/dL (ref 1.7–2.4)

## 2021-04-28 MED ORDER — SCOPOLAMINE 1 MG/3DAYS TD PT72: 1.0000 | MEDICATED_PATCH | TRANSDERMAL | 12 refills | Status: DC

## 2021-04-28 MED ORDER — "PEN NEEDLES 3/16"" 31G X 5 MM MISC": 2 refills | Status: DC

## 2021-04-28 MED ORDER — PEGFILGRASTIM-CBQV 6 MG/0.6ML ~~LOC~~ SOSY
6.0000 mg | PREFILLED_SYRINGE | Freq: Once | SUBCUTANEOUS | Status: AC
  Administered 2021-04-28: 6 mg via SUBCUTANEOUS
  Filled 2021-04-28: qty 0.6

## 2021-04-28 MED ORDER — SIMETHICONE 80 MG PO CHEW: 80.0000 mg | CHEWABLE_TABLET | Freq: Four times a day (QID) | ORAL | 0 refills | Status: DC | PRN

## 2021-04-28 MED ORDER — TBO-FILGRASTIM 480 MCG/0.8ML ~~LOC~~ SOSY
480.0000 ug | PREFILLED_SYRINGE | Freq: Once | SUBCUTANEOUS | Status: DC
  Filled 2021-04-28: qty 0.8

## 2021-04-28 MED ORDER — INSULIN DETEMIR 100 UNIT/ML FLEXPEN: 10.0000 [IU] | PEN_INJECTOR | Freq: Every day | SUBCUTANEOUS | 11 refills | Status: DC

## 2021-04-28 NOTE — Progress Notes (Signed)
Nsg Discharge Note ? ?Admit Date:  04/24/2021 ?Discharge date: 04/28/2021 ?  ?Mayer Masker to be D/C'd Home per MD order.  AVS completed.   ?Patient/caregiver able to verbalize understanding. ? ?Discharge Medication: ?Allergies as of 04/28/2021   ? ?   Reactions  ? Adhesive [tape] Hives, Shortness Of Breath, Swelling  ? Tolerates paper tape  ? Clindamycin/lincomycin Itching, Rash, Other (See Comments)  ? Generalized body rash with itching, lip ulcers, seizures  ? Promethazine Other (See Comments)  ? Increase restless leg movement   ? Eggs Or Egg-derived Products Nausea And Vomiting  ? Metoclopramide Hcl Other (See Comments)  ? EXACERBATES RESTLESS LEG SYNDROME  ? Oxycodone-acetaminophen Hives  ? Sulfa Antibiotics Nausea And Vomiting  ? Fluogen [influenza Virus Vaccine] Nausea And Vomiting  ? Levaquin [levofloxacin]   ? seizures  ? Penicillins Itching  ? Has patient had a PCN reaction causing immediate rash, facial/tongue/throat swelling, SOB or lightheadedness with hypotension: No ?Has patient had a PCN reaction causing severe rash involving mucus membranes or skin necrosis: No ?Has patient had a PCN reaction that required hospitalization No ?Has patient had a PCN reaction occurring within the last 10 years: No ?If all of the above answers are "NO", then may proceed with Cephalosporin use.  ? ?  ? ?  ?Medication List  ?  ? ?TAKE these medications   ? ?Accu-Chek Softclix Lancets lancets ?SMARTSIG:2 Topical Twice Daily ?  ?acetaminophen 500 MG tablet ?Commonly known as: TYLENOL ?Take 1,000 mg by mouth every 6 (six) hours as needed for mild pain. ?  ?blood glucose meter kit and supplies ?Dispense based on patient and insurance preference. Use up to four times daily as directed. (FOR ICD-10 E10.9, E11.9). ?  ?diphenhydrAMINE 50 MG tablet ?Commonly known as: BENADRYL ?Take 100 mg by mouth at bedtime as needed for itching or sleep. ?  ?diphenoxylate-atropine 2.5-0.025 MG tablet ?Commonly known as: LOMOTIL ?TAKE 1- 2  TABLETS BY MOUTH 4 TIMES DAILY AS NEEDED FOR DIARRHEA /  LOOSE  STOOLS ?What changed:  ?how much to take ?how to take this ?when to take this ?reasons to take this ?additional instructions ?  ?docusate sodium 100 MG capsule ?Commonly known as: COLACE ?Take 1 capsule (100 mg total) by mouth every 12 (twelve) hours. ?  ?ferrous sulfate 325 (65 FE) MG tablet ?Take 325 mg by mouth daily with breakfast. ?  ?hydrOXYzine 25 MG tablet ?Commonly known as: ATARAX ?Take 1 tablet (25 mg total) by mouth every 6 (six) hours. ?What changed:  ?when to take this ?reasons to take this ?  ?insulin detemir 100 UNIT/ML FlexPen ?Commonly known as: LEVEMIR ?Inject 10 Units into the skin daily. ?  ?lacosamide 200 MG Tabs tablet ?Commonly known as: VIMPAT ?Take 1 tablet by mouth twice daily ?  ?lidocaine-prilocaine cream ?Commonly known as: EMLA ?Apply 1 application topically as directed. Apply to port site 1 hour prior to stick and cover with plastic wrap ?  ?loperamide 2 MG capsule ?Commonly known as: IMODIUM ?Take 2 mg by mouth as needed for diarrhea or loose stools. ?  ?LORazepam 0.5 MG tablet ?Commonly known as: ATIVAN ?Take 1 tablet by mouth twice daily as needed for anxiety ?What changed:  ?reasons to take this ?additional instructions ?  ?magic mouthwash Soln ?Take 5 mLs by mouth 4 (four) times daily. ?  ?magnesium oxide 400 (240 Mg) MG tablet ?Commonly known as: MAG-OX ?Take 1 tablet (400 mg total) by mouth 2 (two) times daily. ?  ?metFORMIN  500 MG tablet ?Commonly known as: GLUCOPHAGE ?Take 1 tablet (500 mg total) by mouth 2 (two) times daily with a meal. ?  ?MIRALAX PO ?Take 17 g by mouth daily as needed (constipation). ?  ?oxyCODONE 5 MG immediate release tablet ?Commonly known as: Oxy IR/ROXICODONE ?Take 1 tablet (5 mg total) by mouth every 6 (six) hours as needed for severe pain. ?  ?pantoprazole 40 MG tablet ?Commonly known as: Protonix ?Take 1 tablet (40 mg total) by mouth daily. ?  ?Pen Needles 3/16" 31G X 5 MM Misc ?Use as  directed. ?  ?prochlorperazine 10 MG tablet ?Commonly known as: COMPAZINE ?Take 1 tablet (10 mg total) by mouth every 6 (six) hours as needed for nausea or vomiting. ?  ?scopolamine 1 MG/3DAYS ?Commonly known as: TRANSDERM-SCOP ?Place 1 patch (1.5 mg total) onto the skin every 3 (three) days. ?Start taking on: April 30, 2021 ?  ?simethicone 80 MG chewable tablet ?Commonly known as: MYLICON ?Chew 1 tablet (80 mg total) by mouth every 6 (six) hours as needed for flatulence. ?  ?vitamin B-12 1000 MCG tablet ?Commonly known as: CYANOCOBALAMIN ?Take 1,000 mcg by mouth daily. ?  ? ?  ? ? ?Discharge Assessment: ?Vitals:  ? 04/27/21 2140 04/28/21 0607  ?BP: 119/77 101/72  ?Pulse: 89 80  ?Resp: 17 17  ?Temp: 98.5 ?F (36.9 ?C) 98.8 ?F (37.1 ?C)  ?SpO2: 100% 100%  ? Skin clean, dry and intact without evidence of skin break down, no evidence of skin tears noted. ?IV catheter discontinued intact. Site without signs and symptoms of complications - no redness or edema noted at insertion site, patient denies c/o pain - only slight tenderness at site.  Dressing with slight pressure applied. ? ?D/c Instructions-Education: ?Discharge instructions given to patient/family with verbalized understanding. ?D/c education completed with patient/family including follow up instructions, medication list, d/c activities limitations if indicated, with other d/c instructions as indicated by MD - patient able to verbalize understanding, all questions fully answered. ?Patient instructed to return to ED, call 911, or call MD for any changes in condition.  ?Patient escorted via [place], and D/C home via private auto. ? ?Kathie Rhodes, RN ?04/28/2021 10:22 AM  ?

## 2021-04-28 NOTE — Progress Notes (Signed)
Date and time results received: 04/28/21 7:30 AM ? ?(use smartphrase ".now" to insert current time) ? ?Test: WBC ?Critical Value: 1.4 ? ?Name of Provider Notified: Dr. Manuella Ghazi ? ?Orders Received? Or Actions Taken?:  ?

## 2021-04-28 NOTE — Discharge Summary (Signed)
?Physician Discharge Summary ?  ?Patient: [name] MRN: [id number] DOB: [birth year]  ?Admit date:     04/24/2021  ?Discharge date: 04/28/21  ?Discharge Physician: Rodena Goldmann  ? ?PCP: [name], Raymond J, MD  ? ?Recommendations at discharge:  ? ?Follow-up with PCP in 1-2 weeks ?Continue on Levemir as prescribed for blood glucose control and resume home metformin ?Continue scopolamine patch and home Compazine as needed for nausea and vomiting ?No need for home antibiotics as cultures are negative ?Continue other home medications as prior ?Follow-up with Dr. Benay Spice as scheduled ? ?Discharge Diagnoses: ?Principal Problem: ?  Sepsis ([place]) ?Active Problems: ?  Metastatic colon cancer in female [facility]) ?  Fever, unspecified ?  DEPRESSION/ANXIETY ?  GERD ?  Anemia due to antineoplastic chemotherapy ?  Seizure ([place]) ?  Diabetes mellitus, type 2 ([place]) ? ?Resolved Problems: ?  * No resolved hospital problems. * ? ?Hospital Course: ?Per HPI: ?[name] is a 46 y.o. female with medical history significant of ovarian cancer, colon cancer, liver metastasis, lung metastasis, anxiety, anemia, GERD, depression/anxiety, and more presents to ED with a chief complaint of malaise.  Patient reports that she has been battling cancer for 8 years.  She was recently restarted on the chemotherapy that she has had in the beginning of her treatments.  She is currently undergoing chemo with chemo pump at bedside.  Her last treatment before this was March 6-9.  She is due to have this 1 and on March 22.  Patient reports with her last chemo, she went to work the next day and was shivering.  She was nauseous but did not throw up.  She ended up having a fever at that time.  She was diagnosed with a UTI-E. coli.  This time she reports that she is thrown up several times at home.  Nonbloody liquid emesis.  She reports that started the same day chemo started, which was March 20.  She checked her temperature at home and had no fever, but still  felt like something was wrong so she came into the ED.  She reports no abdominal pain or diarrhea associated with her nausea and vomiting.  She has a cough but it is relatively unchanged, and related to her GERD.  Patient has no frequency, urgency, or dysuria, but did not have those symptoms with her last UTI either.  She has no new skin rashes, or other infectious symptoms.  Patient has no other complaints at this time. ? ?04/26/21: Patient was admitted with sepsis present on admission with possible UTI.  Blood cultures are negative and urine cultures are pending.  She was noted to have fever and has been seen by oncology and this may be related to her chemotherapy regimen.  She remains on IV antibiotics with improving fever and symptoms noted. ? ?04/27/21: Urine cultures and blood cultures remain negative and fevers are improving.  Cefepime will be discontinued today.  She continues to have some ongoing nausea and poor appetite.  Continue to monitor and anticipate discharge in next 24 hours if further improved. ? ?04/28/2021: Patient's GI symptoms have improved and she is stable for discharge today white blood cell counts have been noted to be low and therefore she will receive some Neupogen prior to discharge.  She will remain on medications as otherwise noted below.  No other acute events during the course of this admission. ? ?Assessment and Plan: ? ?Principal discharge diagnosis: Fever secondary to chemotherapy treatment.  Sepsis ruled  out. ? ?  ? ? ?Consultants: Oncology ?Procedures performed: None ?Disposition: Home ?Diet recommendation:  ?Discharge Diet Orders (From admission, onward)  ? ?  Start     Ordered  ? 04/28/21 0000  Diet - low sodium heart healthy       ? 04/28/21 0947  ? ?  ?  ? ?  ? ?Cardiac and Carb modified diet ?DISCHARGE MEDICATION: ?Allergies as of 04/28/2021   ? ?   Reactions  ? Adhesive [tape] Hives, Shortness Of Breath, Swelling  ? Tolerates paper tape  ? Clindamycin/lincomycin Itching,  Rash, Other (See Comments)  ? Generalized body rash with itching, lip ulcers, seizures  ? Promethazine Other (See Comments)  ? Increase restless leg movement   ? Eggs Or Egg-derived Products Nausea And Vomiting  ? Metoclopramide Hcl Other (See Comments)  ? EXACERBATES RESTLESS LEG SYNDROME  ? Oxycodone-acetaminophen Hives  ? Sulfa Antibiotics Nausea And Vomiting  ? Fluogen [influenza Virus Vaccine] Nausea And Vomiting  ? Levaquin [levofloxacin]   ? seizures  ? Penicillins Itching  ? Has patient had a PCN reaction causing immediate rash, facial/tongue/throat swelling, SOB or lightheadedness with hypotension: No ?Has patient had a PCN reaction causing severe rash involving mucus membranes or skin necrosis: No ?Has patient had a PCN reaction that required hospitalization No ?Has patient had a PCN reaction occurring within the last 10 years: No ?If all of the above answers are "NO", then may proceed with Cephalosporin use.  ? ?  ? ?  ?Medication List  ?  ? ?TAKE these medications   ? ?Accu-Chek Softclix Lancets lancets ?SMARTSIG:2 Topical Twice Daily ?  ?acetaminophen 500 MG tablet ?Commonly known as: TYLENOL ?Take 1,000 mg by mouth every 6 (six) hours as needed for mild pain. ?  ?blood glucose meter kit and supplies ?Dispense based on patient and insurance preference. Use up to four times daily as directed. (FOR ICD-10 E10.9, E11.9). ?  ?diphenhydrAMINE 50 MG tablet ?Commonly known as: BENADRYL ?Take 100 mg by mouth at bedtime as needed for itching or sleep. ?  ?diphenoxylate-atropine 2.5-0.025 MG tablet ?Commonly known as: LOMOTIL ?TAKE 1- 2 TABLETS BY MOUTH 4 TIMES DAILY AS NEEDED FOR DIARRHEA /  LOOSE  STOOLS ?What changed:  ?how much to take ?how to take this ?when to take this ?reasons to take this ?additional instructions ?  ?docusate sodium 100 MG capsule ?Commonly known as: COLACE ?Take 1 capsule (100 mg total) by mouth every 12 (twelve) hours. ?  ?ferrous sulfate 325 (65 FE) MG tablet ?Take 325 mg by mouth  daily with breakfast. ?  ?hydrOXYzine 25 MG tablet ?Commonly known as: ATARAX ?Take 1 tablet (25 mg total) by mouth every 6 (six) hours. ?What changed:  ?when to take this ?reasons to take this ?  ?insulin detemir 100 UNIT/ML FlexPen ?Commonly known as: LEVEMIR ?Inject 10 Units into the skin daily. ?  ?lacosamide 200 MG Tabs tablet ?Commonly known as: VIMPAT ?Take 1 tablet by mouth twice daily ?  ?lidocaine-prilocaine cream ?Commonly known as: EMLA ?Apply 1 application topically as directed. Apply to port site 1 hour prior to stick and cover with plastic wrap ?  ?loperamide 2 MG capsule ?Commonly known as: IMODIUM ?Take 2 mg by mouth as needed for diarrhea or loose stools. ?  ?LORazepam 0.5 MG tablet ?Commonly known as: ATIVAN ?Take 1 tablet by mouth twice daily as needed for anxiety ?What changed:  ?reasons to take this ?additional instructions ?  ?magic mouthwash Soln ?Take 5 mLs  by mouth 4 (four) times daily. ?  ?magnesium oxide 400 (240 Mg) MG tablet ?Commonly known as: MAG-OX ?Take 1 tablet (400 mg total) by mouth 2 (two) times daily. ?  ?metFORMIN 500 MG tablet ?Commonly known as: GLUCOPHAGE ?Take 1 tablet (500 mg total) by mouth 2 (two) times daily with a meal. ?  ?MIRALAX PO ?Take 17 g by mouth daily as needed (constipation). ?  ?oxyCODONE 5 MG immediate release tablet ?Commonly known as: Oxy IR/ROXICODONE ?Take 1 tablet (5 mg total) by mouth every 6 (six) hours as needed for severe pain. ?  ?pantoprazole 40 MG tablet ?Commonly known as: Protonix ?Take 1 tablet (40 mg total) by mouth daily. ?  ?Pen Needles 3/16" 31G X 5 MM Misc ?Use as directed. ?  ?prochlorperazine 10 MG tablet ?Commonly known as: COMPAZINE ?Take 1 tablet (10 mg total) by mouth every 6 (six) hours as needed for nausea or vomiting. ?  ?scopolamine 1 MG/3DAYS ?Commonly known as: TRANSDERM-SCOP ?Place 1 patch (1.5 mg total) onto the skin every 3 (three) days. ?Start taking on: April 30, 2021 ?  ?simethicone 80 MG chewable tablet ?Commonly  known as: MYLICON ?Chew 1 tablet (80 mg total) by mouth every 6 (six) hours as needed for flatulence. ?  ?vitamin B-12 1000 MCG tablet ?Commonly known as: CYANOCOBALAMIN ?Take 1,000 mcg by mouth daily. ?  ? ?

## 2021-04-28 NOTE — Progress Notes (Signed)
[name] presents today for injection per the provider's orders.  Udenyca '6mg'$  administration without incident; injection site WNL; see MAR for injection details.  Patient tolerated procedure well and without incident.  No questions or complaints noted at this time. Treatment given today per MD orders. Vital signs stable. No complaints at this time. Discharged from clinic by wheel chair in stable condition. Alert and oriented x 3. F/U with [facility] as scheduled.   ?

## 2021-04-29 LAB — CULTURE, BLOOD (ROUTINE X 2): Culture: NO GROWTH

## 2021-05-01 ENCOUNTER — Ambulatory Visit (INDEPENDENT_AMBULATORY_CARE_PROVIDER_SITE_OTHER): Payer: BC Managed Care – PPO | Admitting: Family Medicine

## 2021-05-01 ENCOUNTER — Other Ambulatory Visit: Payer: Self-pay

## 2021-05-01 VITALS — BP 122/74 | HR 92 | Temp 98.7°F | Ht 66.0 in | Wt 155.6 lb

## 2021-05-01 DIAGNOSIS — Z794 Long term (current) use of insulin: Secondary | ICD-10-CM | POA: Diagnosis not present

## 2021-05-01 DIAGNOSIS — E1165 Type 2 diabetes mellitus with hyperglycemia: Secondary | ICD-10-CM | POA: Diagnosis not present

## 2021-05-01 LAB — CULTURE, BLOOD (ROUTINE X 2): Special Requests: ADEQUATE

## 2021-05-01 MED ORDER — INSULIN GLARGINE (1 UNIT DIAL) 300 UNIT/ML ~~LOC~~ SOPN: 10.0000 [IU] | PEN_INJECTOR | Freq: Every day | SUBCUTANEOUS | 2 refills | Status: DC

## 2021-05-01 NOTE — Progress Notes (Signed)
? ? ?  Procedures performed today:   ? ?None. ? ?Independent interpretation of notes and tests performed by another provider:  ? ?None. ? ?Brief History, Exam, Impression, and Recommendations:   ? ?BP 122/74   Pulse 92   Temp 98.7 ?F (37.1 ?C)   Ht '5\' 6"'$  (1.676 m)   Wt 155 lb 9.6 oz (70.6 kg)   SpO2 98%   BMI 25.11 kg/m?  ? ?Diabetes mellitus, type 2 ([place]) ?Patient had recent hospitalization related to fever, sepsis.  As part of managing her blood sugar, she was started on a basal insulin, was prescribed Levemir but does report that this is expensive for her based on insurance coverage.  She is requesting for an alternative basal insulin option.  Reports that she has been checking her fasting blood sugars and these have generally been in the low 200s.  She is currently doing 10 units of Levemir daily.  At our last visit, we discussed her elevated hemoglobin A1c and need for better control.  Did discuss insulin options at that time, however patient was reluctant to proceed with this. ?Do feel that basal insulin is likely to provide better control of blood sugars.  We will send a prescription for an alternative basal insulin option which will hopefully be more affordable ?We will plan for close follow-up in about 4 to 6 weeks to monitor progress, discuss further insulin titration ?Her next appointment with oncology is next week ? ? ?___________________________________________ ?[place] Rollyson [name], MD, ABFM, CAQSM ?Primary Care and Sports Medicine ?[place] ?

## 2021-05-01 NOTE — Patient Instructions (Signed)
?  Medication Instructions:  ?Your physician recommends that you continue on your current medications as directed. Please refer to the Current Medication list given to you today. ?--If you need a refill on any your medications before your next appointment, please call your pharmacy first. If no refills are authorized on file call the office.-- ? ? ? ?Follow-Up: ?Your next appointment:   ?Your physician recommends that you schedule a follow-up appointment in: 4-6 follow up diabetes with Dr. [name] ? ?You will receive a text message or e-mail with a link to a survey about your care and experience with [place] today! We would greatly appreciate your feedback!  ? ?Thanks for letting us be apart of your health journey!!  ?Primary Care and Sports Medicine  ? ?Dr. Kyung Rudd [name]  ? ?We encourage you to activate your patient portal called "MyChart".  Sign up information is provided on this After Visit Summary.  MyChart is used to connect with patients for Virtual Visits (Telemedicine).  Patients are able to view lab/test results, encounter notes, upcoming appointments, etc.  Non-urgent messages can be sent to your provider as well. To learn more about what you can do with MyChart, please visit --  [email or web address].    ?

## 2021-05-01 NOTE — Assessment & Plan Note (Signed)
Patient had recent hospitalization related to fever, sepsis.  As part of managing her blood sugar, she was started on a basal insulin, was prescribed Levemir but does report that this is expensive for her based on insurance coverage.  She is requesting for an alternative basal insulin option.  Reports that she has been checking her fasting blood sugars and these have generally been in the low 200s.  She is currently doing 10 units of Levemir daily.  At our last visit, we discussed her elevated hemoglobin A1c and need for better control.  Did discuss insulin options at that time, however patient was reluctant to proceed with this. ?Do feel that basal insulin is likely to provide better control of blood sugars.  We will send a prescription for an alternative basal insulin option which will hopefully be more affordable ?We will plan for close follow-up in about 4 to 6 weeks to monitor progress, discuss further insulin titration ?

## 2021-05-02 ENCOUNTER — Encounter (HOSPITAL_BASED_OUTPATIENT_CLINIC_OR_DEPARTMENT_OTHER): Payer: Self-pay | Admitting: Family Medicine

## 2021-05-02 ENCOUNTER — Telehealth: Payer: Self-pay | Admitting: *Deleted

## 2021-05-02 DIAGNOSIS — E1165 Type 2 diabetes mellitus with hyperglycemia: Secondary | ICD-10-CM

## 2021-05-02 NOTE — Telephone Encounter (Signed)
Informed Matti that MD unsure as to reason for her right breast/chest wall pain. Will monitor for now to see if it changes or improves. Will examine her at next visit. ?

## 2021-05-02 NOTE — Telephone Encounter (Signed)
[name] was contacted by telephone to verify understanding of discharge instructions status post their most recent discharge from the hospital on the date:  04/28/2021.  Inpatient discharge AVS was re-reviewed with patient, along with cancer center appointments.  Verification of understanding for oncology specific follow-up was validated using the Teach Back method.   ? ?Transportation to appointments were confirmed for the patient as being self/caregiver. ? ?[name]?s questions were addressed to their satisfaction upon completion of this post discharge follow-up call for outpatient oncology. ? ?She reports having continued back pain thinking it is related to the [name] on 3/24. Taking Advil and has oxycodone for night. Reports having dull pain in right chest below breast at rib cage area that is also tender to touch. This pre-dates the [name] (started in hospital). Denies any shortness of breath. She still has feelings of fullness and feeling bloated, but has not been able to pick up her discharge scripts ([place]). Plans to do so today. She denies any fever.  ?

## 2021-05-04 ENCOUNTER — Encounter: Payer: Self-pay | Admitting: Oncology

## 2021-05-05 MED ORDER — INSULIN NPH (HUMAN) (ISOPHANE) 100 UNIT/ML ~~LOC~~ SUSP: 10.0000 [IU] | Freq: Every day | SUBCUTANEOUS | 1 refills | Status: DC

## 2021-05-06 ENCOUNTER — Emergency Department (HOSPITAL_COMMUNITY): Payer: BC Managed Care – PPO

## 2021-05-06 ENCOUNTER — Other Ambulatory Visit: Payer: Self-pay | Admitting: Oncology

## 2021-05-06 ENCOUNTER — Emergency Department (HOSPITAL_COMMUNITY)
Admission: EM | Admit: 2021-05-06 | Discharge: 2021-05-06 | Disposition: A | Discharge status: Home / Self Care | Payer: BC Managed Care – PPO | Attending: Emergency Medicine | Admitting: Emergency Medicine

## 2021-05-06 ENCOUNTER — Other Ambulatory Visit: Payer: Self-pay

## 2021-05-06 ENCOUNTER — Encounter (HOSPITAL_COMMUNITY): Payer: Self-pay

## 2021-05-06 DIAGNOSIS — R188 Other ascites: Secondary | ICD-10-CM | POA: Diagnosis not present

## 2021-05-06 DIAGNOSIS — Z85118 Personal history of other malignant neoplasm of bronchus and lung: Secondary | ICD-10-CM | POA: Insufficient documentation

## 2021-05-06 DIAGNOSIS — R079 Chest pain, unspecified: Secondary | ICD-10-CM | POA: Diagnosis not present

## 2021-05-06 DIAGNOSIS — K5900 Constipation, unspecified: Secondary | ICD-10-CM | POA: Insufficient documentation

## 2021-05-06 DIAGNOSIS — Z20822 Contact with and (suspected) exposure to covid-19: Secondary | ICD-10-CM | POA: Diagnosis not present

## 2021-05-06 DIAGNOSIS — E876 Hypokalemia: Secondary | ICD-10-CM | POA: Insufficient documentation

## 2021-05-06 DIAGNOSIS — K760 Fatty (change of) liver, not elsewhere classified: Secondary | ICD-10-CM | POA: Diagnosis not present

## 2021-05-06 DIAGNOSIS — R1011 Right upper quadrant pain: Secondary | ICD-10-CM

## 2021-05-06 DIAGNOSIS — K7689 Other specified diseases of liver: Secondary | ICD-10-CM | POA: Diagnosis not present

## 2021-05-06 DIAGNOSIS — Z8543 Personal history of malignant neoplasm of ovary: Secondary | ICD-10-CM | POA: Diagnosis not present

## 2021-05-06 DIAGNOSIS — K8020 Calculus of gallbladder without cholecystitis without obstruction: Secondary | ICD-10-CM | POA: Insufficient documentation

## 2021-05-06 LAB — URINALYSIS, ROUTINE W REFLEX MICROSCOPIC
Bilirubin Urine: NEGATIVE
Glucose, UA: >=500 mg/dL — AB
Hgb urine dipstick: NEGATIVE
Ketones, ur: 20 mg/dL — AB
Nitrite: NEGATIVE
Protein, ur: 30 mg/dL — AB
Specific Gravity, Urine: 1.031 — ABNORMAL HIGH (ref 1.005–1.030)
pH: 5 (ref 5.0–8.0)

## 2021-05-06 LAB — CBC WITH DIFFERENTIAL/PLATELET
Band Neutrophils: 4 %
Basophils Absolute: 0 10*3/uL (ref 0.0–0.1)
Basophils Relative: 0 %
Eosinophils Absolute: 0 10*3/uL (ref 0.0–0.5)
Eosinophils Relative: 0 %
HCT: 30.7 % — ABNORMAL LOW (ref 36.0–46.0)
Hemoglobin: 9 g/dL — ABNORMAL LOW (ref 12.0–15.0)
Lymphocytes Relative: 15 %
Lymphs Abs: 1.1 10*3/uL (ref 0.7–4.0)
MCH: 23.2 pg — ABNORMAL LOW (ref 26.0–34.0)
MCHC: 29.3 g/dL — ABNORMAL LOW (ref 30.0–36.0)
MCV: 79.1 fL — ABNORMAL LOW (ref 80.0–100.0)
Metamyelocytes Relative: 6 %
Monocytes Absolute: 0.7 10*3/uL (ref 0.1–1.0)
Monocytes Relative: 9 %
Myelocytes: 4 %
Neutro Abs: 4.8 10*3/uL (ref 1.7–7.7)
Neutrophils Relative %: 61 %
Platelets: 112 10*3/uL — ABNORMAL LOW (ref 150–400)
Promyelocytes Relative: 1 %
RBC: 3.88 MIL/uL (ref 3.87–5.11)
RDW: 21.4 % — ABNORMAL HIGH (ref 11.5–15.5)
WBC: 7.4 10*3/uL (ref 4.0–10.5)
nRBC: 0.5 % — ABNORMAL HIGH (ref 0.0–0.2)

## 2021-05-06 LAB — COMPREHENSIVE METABOLIC PANEL
ALT: 21 U/L (ref 0–44)
AST: 35 U/L (ref 15–41)
Albumin: 3.2 g/dL — ABNORMAL LOW (ref 3.5–5.0)
Alkaline Phosphatase: 155 U/L — ABNORMAL HIGH (ref 38–126)
Anion gap: 13 (ref 5–15)
BUN: 10 mg/dL (ref 6–20)
CO2: 23 mmol/L (ref 22–32)
Calcium: 8.7 mg/dL — ABNORMAL LOW (ref 8.9–10.3)
Chloride: 100 mmol/L (ref 98–111)
Creatinine, Ser: 0.44 mg/dL (ref 0.44–1.00)
GFR, Estimated: >60 mL/min (ref >60)
Glucose, Bld: 202 mg/dL — ABNORMAL HIGH (ref 70–99)
Potassium: 2.9 mmol/L — ABNORMAL LOW (ref 3.5–5.1)
Sodium: 136 mmol/L (ref 135–145)
Total Bilirubin: 1.4 mg/dL — ABNORMAL HIGH (ref 0.3–1.2)
Total Protein: 7.3 g/dL (ref 6.5–8.1)

## 2021-05-06 LAB — RESP PANEL BY RT-PCR (FLU A&B, COVID) ARPGX2
Influenza A by PCR: NEGATIVE
Influenza B by PCR: NEGATIVE
SARS Coronavirus 2 by RT PCR: NEGATIVE

## 2021-05-06 LAB — LIPASE, BLOOD: Lipase: 31 U/L (ref 11–51)

## 2021-05-06 MED ORDER — HYDROMORPHONE HCL 1 MG/ML IJ SOLN
1.0000 mg | Freq: Once | INTRAMUSCULAR | Status: AC
  Administered 2021-05-06: 1 mg via INTRAVENOUS
  Filled 2021-05-06: qty 1

## 2021-05-06 MED ORDER — SENNOSIDES-DOCUSATE SODIUM 8.6-50 MG PO TABS: 1.0000 | ORAL_TABLET | Freq: Every day | ORAL | 0 refills | Status: DC | PRN

## 2021-05-06 MED ORDER — OXYCODONE HCL 5 MG PO TABS
10.0000 mg | ORAL_TABLET | Freq: Four times a day (QID) | ORAL | 0 refills | Status: DC | PRN
Start: 2021-05-06 — End: 2021-06-21

## 2021-05-06 MED ORDER — IOHEXOL 300 MG/ML  SOLN
100.0000 mL | Freq: Once | INTRAMUSCULAR | Status: AC | PRN
  Administered 2021-05-06: 100 mL via INTRAVENOUS

## 2021-05-06 MED ORDER — SODIUM CHLORIDE 0.9 % IV BOLUS
1000.0000 mL | Freq: Once | INTRAVENOUS | Status: AC
  Administered 2021-05-06: 1000 mL via INTRAVENOUS

## 2021-05-06 MED ORDER — HEPARIN SOD (PORK) LOCK FLUSH 100 UNIT/ML IV SOLN
500.0000 [IU] | Freq: Once | INTRAVENOUS | Status: AC
  Administered 2021-05-06: 500 [IU]
  Filled 2021-05-06: qty 5

## 2021-05-06 MED ORDER — POTASSIUM CHLORIDE 10 MEQ/100ML IV SOLN
10.0000 meq | INTRAVENOUS | Status: AC
  Administered 2021-05-06 (×2): 10 meq via INTRAVENOUS
  Filled 2021-05-06 (×2): qty 100

## 2021-05-06 NOTE — ED Notes (Signed)
Patient transported to CT 

## 2021-05-06 NOTE — Discharge Instructions (Signed)
At home for pain control you can double your oxycodone dose to 10 mg every 6 hours.  Some of your pain may be related to constipation, which unfortunately can be caused by narcotics like oxycodone.  I do recommend a bowel cleanout and would consider a rectal suppository combined with an oral laxative.  This could be Dulcolax, magnesium citrate, or another type of oral laxative.  I also prescribed Peri-Colace which is a gentle laxative stool softener which may help with your bowel movements. ? ?Please follow-up with the oncologist on Monday as scheduled. ? ?If your pain is not better after a bowel cleanout, you may need a referral to a general surgeon for gallbladder evaluation.  You do have gallstones in your gallbladder, but no signs of inflammation at this time around the gallbladder.  I provided you the phone number for a local surgeon in [place] as well as the [facility] in [place]. ?

## 2021-05-06 NOTE — ED Provider Notes (Signed)
?[facility] ?Provider Note ? ? ?CSN: [id number] ?Arrival date & time: 05/06/21  3151 ? ?  ? ?History ? ?Chief Complaint  ?Patient presents with  ? Abdominal Pain  ? ? ?[name] is a 46 y.o. female present emergency department of abdominal pain.  She has a history of ovarian cancer, colon cancer with liver metastasis, lung metastasis, anemia, reflux, anxiety, currently undergoing chemotherapy.  Patient reports that she is having right upper quadrant abdominal pain, although she is not certain exactly when it started, she thinks in the past week.  It is significantly worse after eating, is waxing and waning in intensity.  She states she has never had this before and this is not similar to any type of pain she has had with her liver metastasis.  She denies coughing or shortness of breath.  She reports she is significantly constipated, not had a regular bowel movement in 2 weeks, reports he had a very small pebble-like bowel movement yesterday but does not feel she is passing gas.  She does have a history of numerous abdominal surgeries.  She also is a history of gallstones ? ?CT scan of chest abdomen pelvis February 2022 showed liver masses that appear to be responding favorably to treatment, also multiple pulmonary nodules including right upper pulmonary nodule and 6 additional right pulmonary nodules, 8 left-sided pulmonary nodules.  She does have hepatic lobe lesions as well.  She was noted to have splenomegaly.  She has a history of an ileocecal resection with anastomosis in the right hemiabdomen, and and thickening along the ascending colon as well. ? ?Per medical record review the patient been hospitalized approximately 2 weeks ago for possible sepsis, although her blood culture subsequently had no significant growth, likely contaminant in a single bottle.  She was treated for UTI at the time, although her urine culture also did not grow any organisms.  She was found to be leukopenic and  received Neupogen prior to discharge.  It was felt that she had experienced a fever secondary to her chemotherapy regimen. ? ?HPI ? ?  ? ?Home Medications ?Prior to Admission medications   ?Medication Sig Start Date End Date Taking? Authorizing Provider  ?acetaminophen (TYLENOL) 500 MG tablet Take 1,000 mg by mouth every 6 (six) hours as needed for mild pain.   Yes [provider]  ?diphenhydrAMINE (BENADRYL) 50 MG tablet Take 100 mg by mouth at bedtime as needed for itching or sleep.   Yes [provider]  ?docusate sodium (COLACE) 100 MG capsule Take 1 capsule (100 mg total) by mouth every 12 (twelve) hours. 03/27/21  Yes Varney Biles, MD  ?hydrOXYzine (ATARAX/VISTARIL) 25 MG tablet Take 1 tablet (25 mg total) by mouth every 6 (six) hours. ?Patient taking differently: Take 25 mg by mouth every 6 (six) hours as needed for itching. 12/03/20  Yes Mickie Hillier, PA-C  ?ibuprofen (ADVIL) 200 MG tablet Take 400 mg by mouth every 6 (six) hours as needed (severe pain).   Yes [provider]  ?lacosamide (VIMPAT) 200 MG TABS tablet Take 1 tablet by mouth twice daily ?Patient taking differently: Take 200 mg by mouth 2 (two) times daily. 02/17/21  Yes Tomi Likens, Adam R, DO  ?lidocaine-prilocaine (EMLA) cream Apply 1 application topically as directed. Apply to port site 1 hour prior to stick and cover with plastic wrap 10/22/19  Yes [name], MD  ?loperamide (IMODIUM) 2 MG capsule Take 2 mg by mouth as needed for diarrhea or  loose stools.   Yes [provider]  ?LORazepam (ATIVAN) 0.5 MG tablet Take 1 tablet by mouth twice daily as needed for anxiety ?Patient taking differently: Take 0.5 mg by mouth 2 (two) times daily as needed for anxiety. 04/24/21  Yes [name], MD  ?magic mouthwash SOLN Take 5 mLs by mouth 4 (four) times daily. ?Patient taking differently: Take 5 mLs by mouth 4 (four) times daily as needed (Only used after a chemo treatment.). 04/15/21  Yes [name],  MD  ?magnesium oxide (MAG-OX) 400 (240 Mg) MG tablet Take 1 tablet (400 mg total) by mouth 2 (two) times daily. 03/09/21  Yes [name], MD  ?metFORMIN (GLUCOPHAGE) 500 MG tablet Take 1 tablet (500 mg total) by mouth 2 (two) times daily with a meal. 03/16/21  Yes [name], [name], MD  ?oxyCODONE (OXY IR/ROXICODONE) 5 MG immediate release tablet Take 1 tablet (5 mg total) by mouth every 6 (six) hours as needed for severe pain. 03/29/21  Yes Owens Shark, NP  ?oxyCODONE (ROXICODONE) 5 MG immediate release tablet Take 2 tablets (10 mg total) by mouth every 6 (six) hours as needed for severe pain. 05/06/21 06/05/21 Yes Lev Cervone, Carola Rhine, MD  ?pantoprazole (PROTONIX) 40 MG tablet Take 1 tablet (40 mg total) by mouth daily. 04/24/21  Yes [name], MD  ?Polyethylene Glycol 3350 (MIRALAX PO) Take 17 g by mouth daily as needed (constipation).   Yes [provider]  ?prochlorperazine (COMPAZINE) 10 MG tablet Take 1 tablet (10 mg total) by mouth every 6 (six) hours as needed for nausea or vomiting. 12/20/20  Yes Owens Shark, NP  ?senna-docusate (SENOKOT-S) 8.6-50 MG tablet Take 1 tablet by mouth daily as needed for up to 30 doses for mild constipation. 05/06/21  Yes Taysom Glymph, Carola Rhine, MD  ?vitamin B-12 (CYANOCOBALAMIN) 1000 MCG tablet Take 1,000 mcg by mouth daily.   Yes [provider]  ?Accu-Chek Softclix Lancets lancets SMARTSIG:2 Topical Twice Daily 07/22/20   [provider]  ?blood glucose meter kit and supplies Dispense based on patient and insurance preference. Use up to four times daily as directed. (FOR ICD-10 E10.9, E11.9). 07/22/20   [name], Raymond J, MD  ?diphenoxylate-atropine (LOMOTIL) 2.5-0.025 MG tablet TAKE 1- 2 TABLETS BY MOUTH 4 TIMES DAILY AS NEEDED FOR DIARRHEA /  LOOSE  STOOLS ?Patient taking differently: Take 1-2 tablets by mouth 4 (four) times daily as needed for diarrhea or loose stools. 04/24/21   [name], MD  ?insulin NPH Human (NOVOLIN N) 100 UNIT/ML  injection Inject 0.1 mLs (10 Units total) into the skin at bedtime. 05/05/21   [name], Raymond J, MD  ?Insulin Pen Needle (PEN NEEDLES 3/16") 31G X 5 MM MISC Use as directed. 04/28/21   Manuella Ghazi, Pratik D, DO  ?scopolamine (TRANSDERM-SCOP) 1 MG/3DAYS Place 1 patch (1.5 mg total) onto the skin every 3 (three) days. 04/30/21   Manuella Ghazi, Pratik D, DO  ?simethicone (MYLICON) 80 MG chewable tablet Chew 1 tablet (80 mg total) by mouth every 6 (six) hours as needed for flatulence. 04/28/21   Heath Lark D, DO  ?   ? ?Allergies    ?Adhesive [tape], Clindamycin/lincomycin, Promethazine, Eggs or egg-derived products, Metoclopramide hcl, Oxycodone-acetaminophen, Sulfa antibiotics, Fluogen [influenza virus vaccine], Levaquin [levofloxacin], and Penicillins   ? ?Review of Systems   ?Review of Systems ? ?Physical Exam ?Updated Vital Signs ?BP 118/75   Pulse 97   Temp 98.5 ?F (36.9 ?C) (Oral)  Resp 16   Ht 5' 6"  (1.676 m)   Wt 70.8 kg   SpO2 96%   BMI 25.18 kg/m?  ?Physical Exam ?Constitutional:   ?   General: She is not in acute distress. ?HENT:  ?   Head: Normocephalic and atraumatic.  ?Eyes:  ?   Conjunctiva/sclera: Conjunctivae normal.  ?   Pupils: Pupils are equal, round, and reactive to light.  ?Cardiovascular:  ?   Rate and Rhythm: Normal rate and regular rhythm.  ?Pulmonary:  ?   Effort: Pulmonary effort is normal. No respiratory distress.  ?Abdominal:  ?   General: Abdomen is protuberant. There is no distension.  ?   Tenderness: There is abdominal tenderness in the right upper quadrant and epigastric area.  ?   Comments: Several abdominal surgical scar  ?Skin: ?   General: Skin is warm and dry.  ?Neurological:  ?   General: No focal deficit present.  ?   Mental Status: She is alert. Mental status is at baseline.  ?Psychiatric:     ?   Mood and Affect: Mood normal.     ?   Behavior: Behavior normal.  ? ? ?ED Results / Procedures / Treatments   ?Labs ?(all labs ordered are listed, but only abnormal results are  displayed) ?Labs Reviewed  ?COMPREHENSIVE METABOLIC PANEL - Abnormal; Notable for the following components:  ?    Result Value  ? Potassium 2.9 (*)   ? Glucose, Bld 202 (*)   ? Calcium 8.7 (*)   ? Albumin 3.2 (*)   ? [place]

## 2021-05-06 NOTE — ED Triage Notes (Signed)
Patient c/o right upper abd pain with nausea and vomiting. Denies any diarrhea or urinary symptoms. Patient recently admitted 3/20-3/24 for sepsis and similar abd pain. Patient states she had fevers of 102 at time of admission but none since. Patient discharged with oxycodone in which she took some this morning with some relief. Patient states last normal BM 2 weeks ago. Denies any blood in stool. Per patient was told she possibly had stomach cancer or scar tissue from previous abd surgeries.  ?

## 2021-05-07 ENCOUNTER — Encounter: Payer: Self-pay | Admitting: Oncology

## 2021-05-08 ENCOUNTER — Other Ambulatory Visit: Payer: Self-pay | Admitting: *Deleted

## 2021-05-08 ENCOUNTER — Encounter: Payer: Self-pay | Admitting: Oncology

## 2021-05-08 ENCOUNTER — Inpatient Hospital Stay: Payer: BC Managed Care – PPO

## 2021-05-08 ENCOUNTER — Encounter: Payer: Self-pay | Admitting: *Deleted

## 2021-05-08 ENCOUNTER — Inpatient Hospital Stay (HOSPITAL_BASED_OUTPATIENT_CLINIC_OR_DEPARTMENT_OTHER): Payer: BC Managed Care – PPO | Admitting: Oncology

## 2021-05-08 ENCOUNTER — Inpatient Hospital Stay: Payer: BC Managed Care – PPO | Attending: Oncology

## 2021-05-08 VITALS — BP 134/74 | HR 100 | Temp 98.0°F | Resp 18 | Ht 66.0 in | Wt 162.6 lb

## 2021-05-08 DIAGNOSIS — C787 Secondary malignant neoplasm of liver and intrahepatic bile duct: Secondary | ICD-10-CM | POA: Insufficient documentation

## 2021-05-08 DIAGNOSIS — D696 Thrombocytopenia, unspecified: Secondary | ICD-10-CM | POA: Diagnosis not present

## 2021-05-08 DIAGNOSIS — C182 Malignant neoplasm of ascending colon: Secondary | ICD-10-CM | POA: Diagnosis not present

## 2021-05-08 DIAGNOSIS — R162 Hepatomegaly with splenomegaly, not elsewhere classified: Secondary | ICD-10-CM | POA: Diagnosis not present

## 2021-05-08 DIAGNOSIS — R188 Other ascites: Secondary | ICD-10-CM | POA: Diagnosis not present

## 2021-05-08 DIAGNOSIS — C189 Malignant neoplasm of colon, unspecified: Secondary | ICD-10-CM

## 2021-05-08 DIAGNOSIS — R21 Rash and other nonspecific skin eruption: Secondary | ICD-10-CM | POA: Diagnosis not present

## 2021-05-08 DIAGNOSIS — E876 Hypokalemia: Secondary | ICD-10-CM

## 2021-05-08 DIAGNOSIS — Z933 Colostomy status: Secondary | ICD-10-CM | POA: Diagnosis not present

## 2021-05-08 DIAGNOSIS — Z9049 Acquired absence of other specified parts of digestive tract: Secondary | ICD-10-CM | POA: Insufficient documentation

## 2021-05-08 DIAGNOSIS — C7961 Secondary malignant neoplasm of right ovary: Secondary | ICD-10-CM | POA: Insufficient documentation

## 2021-05-08 DIAGNOSIS — Z79899 Other long term (current) drug therapy: Secondary | ICD-10-CM | POA: Insufficient documentation

## 2021-05-08 LAB — CBC WITH DIFFERENTIAL (CANCER CENTER ONLY)
Abs Immature Granulocytes: 2.26 10*3/uL — ABNORMAL HIGH (ref 0.00–0.07)
Basophils Absolute: 0 10*3/uL (ref 0.0–0.1)
Basophils Relative: 0 %
Eosinophils Absolute: 0 10*3/uL (ref 0.0–0.5)
Eosinophils Relative: 0 %
HCT: 32.2 % — ABNORMAL LOW (ref 36.0–46.0)
Hemoglobin: 9.6 g/dL — ABNORMAL LOW (ref 12.0–15.0)
Immature Granulocytes: 22 %
Lymphocytes Relative: 8 %
Lymphs Abs: 0.8 10*3/uL (ref 0.7–4.0)
MCH: 23.2 pg — ABNORMAL LOW (ref 26.0–34.0)
MCHC: 29.8 g/dL — ABNORMAL LOW (ref 30.0–36.0)
MCV: 77.8 fL — ABNORMAL LOW (ref 80.0–100.0)
Monocytes Absolute: 1 10*3/uL (ref 0.1–1.0)
Monocytes Relative: 10 %
Neutro Abs: 6 10*3/uL (ref 1.7–7.7)
Neutrophils Relative %: 60 %
Platelet Count: 125 10*3/uL — ABNORMAL LOW (ref 150–400)
RBC: 4.14 MIL/uL (ref 3.87–5.11)
RDW: 21.4 % — ABNORMAL HIGH (ref 11.5–15.5)
Smear Review: NORMAL
WBC Count: 10.1 10*3/uL (ref 4.0–10.5)
nRBC: 0.2 % (ref 0.0–0.2)

## 2021-05-08 LAB — CMP (CANCER CENTER ONLY)
ALT: 18 U/L (ref 0–44)
AST: 29 U/L (ref 15–41)
Albumin: 3.5 g/dL (ref 3.5–5.0)
Alkaline Phosphatase: 150 U/L — ABNORMAL HIGH (ref 38–126)
Anion gap: 12 (ref 5–15)
BUN: 10 mg/dL (ref 6–20)
CO2: 26 mmol/L (ref 22–32)
Calcium: 9.1 mg/dL (ref 8.9–10.3)
Chloride: 97 mmol/L — ABNORMAL LOW (ref 98–111)
Creatinine: 0.49 mg/dL (ref 0.44–1.00)
GFR, Estimated: >60 mL/min (ref >60)
Glucose, Bld: 275 mg/dL — ABNORMAL HIGH (ref 70–99)
Potassium: 3 mmol/L — ABNORMAL LOW (ref 3.5–5.1)
Sodium: 135 mmol/L (ref 135–145)
Total Bilirubin: 1.7 mg/dL — ABNORMAL HIGH (ref 0.3–1.2)
Total Protein: 7.3 g/dL (ref 6.5–8.1)

## 2021-05-08 LAB — MAGNESIUM: Magnesium: 1.3 mg/dL — ABNORMAL LOW (ref 1.7–2.4)

## 2021-05-08 MED ORDER — SODIUM CHLORIDE 0.9% FLUSH
10.0000 mL | Freq: Once | INTRAVENOUS | Status: AC
  Administered 2021-05-08: 10 mL via INTRAVENOUS

## 2021-05-08 MED ORDER — SODIUM CHLORIDE 0.9 % IV SOLN: Freq: Once | INTRAVENOUS | Status: AC

## 2021-05-08 MED ORDER — HEPARIN SOD (PORK) LOCK FLUSH 100 UNIT/ML IV SOLN
500.0000 [IU] | Freq: Once | INTRAVENOUS | Status: AC
  Administered 2021-05-08: 500 [IU] via INTRAVENOUS

## 2021-05-08 MED ORDER — MAGNESIUM SULFATE 4 GM/100ML IV SOLN
4.0000 g | Freq: Once | INTRAVENOUS | Status: AC
  Administered 2021-05-08: 4 g via INTRAVENOUS
  Filled 2021-05-08: qty 100

## 2021-05-08 MED ORDER — POTASSIUM CHLORIDE 10 MEQ/100ML IV SOLN
10.0000 meq | INTRAVENOUS | Status: AC
  Administered 2021-05-08 (×2): 10 meq via INTRAVENOUS
  Filled 2021-05-08: qty 100

## 2021-05-08 NOTE — Progress Notes (Signed)
?[facility] ?OFFICE PROGRESS NOTE ? ? ?Diagnosis: Colon cancer ? ?INTERVAL HISTORY:  ? ?[name] completed another cycle of FOLFOX 04/24/2021.  She presented to the emergency room on the evening of 04/24/2021 with nausea/vomiting.  She was noted to have a high fever.  The fever resolved over several days.  Cultures returned negative aside from a gram-positive rod from the left antecubital fossa, likely contaminant. ?She presented to the emergency room with right abdominal pain for 02/24/2021.  A CT abdomen/pelvis revealed increased ascites, gallstones, and no change in low-density liver lesions. ?She continues to have intermittent abdominal pain.  She becomes somnolent when she takes 10 mg of oxycodone.  She is having bowel movements after a suppository and Senokot.  Her pain has not improved. ?Objective: ? ?Vital signs in last 24 hours: ? ?Blood pressure 134/74, pulse 100, temperature 98 ?F (36.7 ?C), resp. rate 18, height _0  (1.676 m), weight 162 lb 9.6 oz (73.8 kg), SpO2 100 %. ?  ? ?HEENT: Geographic tongue, no thrush or ulcers ?Resp: Lungs clear bilaterally with decreased breath sounds at the lower posterior chest, no respiratory distress ?Cardio: Regular rate and rhythm ?GI: Distended, tender at the right costal margin, no mass ?Vascular: Trace ankle edema bilaterally ? ? ?Portacath/PICC-without erythema ? ?Lab Results: ? ?Lab Results  ?Component Value Date  ? WBC 10.1 05/08/2021  ? HGB 9.6 (L) 05/08/2021  ? HCT 32.2 (L) 05/08/2021  ? MCV 77.8 (L) 05/08/2021  ? PLT 125 (L) 05/08/2021  ? NEUTROABS 6.0 05/08/2021  ? ? ?CMP  ?Lab Results  ?Component Value Date  ? NA 135 05/08/2021  ? K 3.0 (L) 05/08/2021  ? CL 97 (L) 05/08/2021  ? CO2 26 05/08/2021  ? GLUCOSE 275 (H) 05/08/2021  ? BUN 10 05/08/2021  ? CREATININE 0.49 05/08/2021  ? CALCIUM 9.1 05/08/2021  ? PROT 7.3 05/08/2021  ? ALBUMIN 3.5 05/08/2021  ? AST 29 05/08/2021  ? ALT 18 05/08/2021  ? ALKPHOS 150 (H) 05/08/2021  ? BILITOT 1.7 (H)  05/08/2021  ? GFRNONAA >60 05/08/2021  ? GFRAA >60 11/05/2019  ? ? ?Lab Results  ?Component Value Date  ? CEA1 43.43 (H) 06/30/2020  ? CEA 155.12 (H) 04/24/2021  ? CA125 14 11/26/2014  ? ? ?Lab Results  ?Component Value Date  ? INR 1.7 (H) 04/25/2021  ? LABPROT 20.1 (H) 04/25/2021  ? ? ?Imaging: ? ?DG Chest 2 View ? ?Result Date: 05/06/2021 ?CLINICAL DATA:  Right lower chest pain.  History of pulmonary mass. EXAM: CHEST - 2 VIEW COMPARISON:  04/25/2021 FINDINGS: The lungs are clear without focal pneumonia, edema, pneumothorax or pleural effusion. The cardiopericardial silhouette is within normal limits for size. Left Port-A-Cath again noted. The visualized bony structures of the thorax are unremarkable. Telemetry leads overlie the chest. IMPRESSION: No active cardiopulmonary disease. Electronically Signed   By: Misty Stanley M.D.   On: 05/06/2021 09:48  ? ?CT ABDOMEN PELVIS W CONTRAST ? ?Result Date: 05/06/2021 ?CLINICAL DATA:  Nausea, vomiting constipation pain right upper quadrant EXAM: CT ABDOMEN AND PELVIS WITH CONTRAST TECHNIQUE: Multidetector CT imaging of the abdomen and pelvis was performed using the standard protocol following bolus administration of intravenous contrast. RADIATION DOSE REDUCTION: This exam was performed according to the departmental dose-optimization program which includes automated exposure control, adjustment of the mA and/or kV according to patient size and/or use of iterative reconstruction technique. CONTRAST:  192m OMNIPAQUE IOHEXOL 300 MG/ML  SOLN COMPARISON:  03/16/2021 FINDINGS: Lower chest: There is  no focal consolidation in the visualized lower lung fields. There is 4 mm noncalcified nodule in the left lateral costophrenic angle. There are few faint 2-3 mm nodules in the right middle lobe. There is minimal pleural effusion on both sides, more so on the right side. Hepatobiliary: There is fatty infiltration in the liver. There is 1.9 cm low-density lesion in the upper posterior  right lobe which has not changed significantly. There is calcific density in the left lobe, possibly residual from previous intervention. There is 1.5 cm low-density lesion in the left lobe with indistinct margins. There is 2.8 cm mixed density lesion in the left lobe. There is no dilation of bile ducts. Gallbladder stones are seen. Pancreas: No focal abnormality is seen. Spleen: Spleen is enlarged measuring 18 cm in maximum diameter. Adrenals/Urinary Tract: Adrenals are unremarkable. There is no hydronephrosis. There are no renal or ureteral stones. Urinary bladder is not distended. Stomach/Bowel: There is prominence of mucosal folds in the stomach which may be partly due to incomplete distention. There is no significant small bowel dilation. Surgical staples are seen in the right colon, possibly from previous right hemicolectomy. There is no significant wall thickening in the colon. Surgical staples are also noted in the transverse colon close to the splenic flexure. Surgical staples are seen in the rectum. Vascular/Lymphatic: Unremarkable. Reproductive: There is 3.2 x 2.1 cm soft tissue density in the left side of pelvis inseparable from the vaginal cuff and anterior margin of rectum. Other: There is significant interval increase in size of ascites with large ascites in the abdomen and pelvis. There is no definite demonstrable focal nodular density in the peritoneum. There is stranding in the subcutaneous fat to the left of umbilicus and in the periumbilical region. Significance of this finding is not clear. This may suggest contusion or cellulitis or related to recent intervention. Musculoskeletal: Degenerative changes are noted at the L5-S1 level with encroachment of neural foramina by bony spurs. IMPRESSION: There is significant interval increase in ascites. Possibility of peritoneal metastatic disease is not excluded. There is 3.2 cm soft tissue lesion anterior to the rectum on the left side suggesting  possible local recurrence of neoplastic process. There is no evidence of intestinal obstruction or pneumoperitoneum. There is no hydronephrosis. There are few small noncalcified nodules in the lower lung fields which may be minimally smaller in size, possibly suggesting regression of pulmonary metastatic disease. Lungs are not fully evaluated in this study. Minimal bilateral pleural effusions are seen, more so on the right side. There are few low-density lesions in the liver suggesting metastatic disease with no significant interval change. Gallbladder stones. Marked splenomegaly. Other findings as described in the body of the report. Electronically Signed   By: Elmer Picker M.D.   On: 05/06/2021 11:54  ? ?US Abdomen Limited RUQ (LIVER/GB) ? ?Result Date: 05/06/2021 ?CLINICAL DATA:  Pain right upper quadrant EXAM: ULTRASOUND ABDOMEN LIMITED RIGHT UPPER QUADRANT COMPARISON:  04/25/2021 FINDINGS: Gallbladder: There is 10 mm echogenic structure in the lumen of the gallbladder. Gallbladder is not distended. Technologist did not observe tenderness over the gallbladder during the study. Common bile duct: Diameter: 4.6 mm Liver: There is increased slightly inhomogeneous echogenicity in the liver. No focal abnormality is seen in the liver. Small perihepatic ascites is present. Portal vein is patent on color Doppler imaging with normal direction of blood flow towards the liver. Other: Ascites is seen in the right upper quadrant with interval . IMPRESSION: Gallbladder stones. There are no signs of acute  cholecystitis. Fatty liver. Small ascites. Electronically Signed   By: Elmer Picker M.D.   On: 05/06/2021 10:17   ? ?Medications: I have reviewed the patient's current medications. ? ? 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 ?The tumor returned microsatellite stable with no loss of mismatch  repair protein expression   ?APC mutated. No BRAF, KRAS, or NRAS mutation On [facility] 1 testing   ?Cycle 1 adjuvant FOLFOX 09/08/2013   ?Cycle 2 adjuvant FOLFOX 09/24/2013   ?Cycle 3 adjuvant FOLFOX 10/08/2013.   ?Cyc

## 2021-05-08 NOTE — Patient Instructions (Signed)

## 2021-05-08 NOTE — Progress Notes (Signed)
Will receive IV KCL and Mg+ today. ?

## 2021-05-08 NOTE — Patient Instructions (Signed)
Hypomagnesemia ?Hypomagnesemia is a condition in which the level of magnesium in the blood is too low. Magnesium is a mineral that is found in many foods. It is used in many different processes in the body. Hypomagnesemia can affect every organ in the body. In severe cases, it can cause life-threatening problems. ?What are the causes? ?This condition may be caused by: ?Not getting enough magnesium in your diet or not having enough healthy foods to eat (malnutrition). ?Problems with magnesium absorption in the intestines. ?Dehydration. ?Excessive use of alcohol. ?Vomiting. ?Severe or long-term (chronic) diarrhea. ?Some medicines, including medicines that make you urinate more often (diuretics). ?Certain diseases, such as kidney disease, diabetes, celiac disease, and overactive thyroid. ?What are the signs or symptoms? ?Symptoms of this condition include: ?Loss of appetite, nausea, and vomiting. ?Involuntary shaking or trembling of a body part (tremor). ?Muscle weakness or tingling in the arms and legs. ?Sudden tightening of muscles (muscle spasms). ?Confusion. ?Psychiatric issues, such as: ?Depression and irritability. ?Psychosis. ?A feeling of fluttering of the heart (palpitations). ?Seizures. ?These symptoms are more severe if magnesium levels drop suddenly. ?How is this diagnosed? ?This condition may be diagnosed based on: ?Your symptoms and medical history. ?A physical exam. ?Blood and urine tests. ?How is this treated? ?Treatment depends on the cause and the severity of the condition. It may be treated by: ?Taking a magnesium supplement. This can be taken in pill form. If the condition is severe, magnesium is usually given through an IV. ?Making changes to your diet. You may be directed to eat foods that have a lot of magnesium, such as green leafy vegetables, peas, beans, and nuts. ?Not drinking alcohol. If you are struggling not to drink, ask your health care provider for help. ?Follow these instructions at  home: ?Eating and drinking ?  ?Make sure that your diet includes foods with magnesium. Foods that have a lot of magnesium in them include: ?Green leafy vegetables, such as spinach and broccoli. ?Beans and peas. ?Nuts and seeds, such as almonds and sunflower seeds. ?Whole grains, such as whole grain bread and fortified cereals. ?Drink fluids that contain salts and minerals (electrolytes), such as sports drinks, when you are active. ?Do not drink alcohol. ?General instructions ?Take over-the-counter and prescription medicines only as told by your health care provider. ?Take magnesium supplements as directed if your health care provider tells you to take them. ?Have your magnesium levels monitored as told by your health care provider. ?Keep all follow-up visits. This is important. ?Contact a health care provider if: ?You get worse instead of better. ?Your symptoms return. ?Get help right away if: ?You develop severe muscle weakness. ?You have trouble breathing. ?You feel that your heart is racing. ?These symptoms may represent a serious problem that is an emergency. Do not wait to see if the symptoms will go away. Get medical help right away. Call your local emergency services (911 in the [place] Do not drive yourself to the hospital. ?Summary ?Hypomagnesemia is a condition in which the level of magnesium in the blood is too low. ?Hypomagnesemia can affect every organ in the body. ?Treatment may include eating more foods that contain magnesium, taking magnesium supplements, and not drinking alcohol. ?Have your magnesium levels monitored as told by your health care provider. ?This information is not intended to replace advice given to you by your health care provider. Make sure you discuss any questions you have with your health care provider. ?Document Revised: 06/21/2020 Document Reviewed: 06/21/2020 ?Elsevier Patient Education ? 2022  [employer]. ? ?Hypokalemia ?Hypokalemia means that the amount of potassium in the  blood is lower than normal. Potassium is a chemical (electrolyte) that helps regulate the amount of fluid in the body. It also stimulates muscle tightening (contraction) and helps nerves work properly. ?Normally, most of the body's potassium is inside cells, and only a very small amount is in the blood. Because the amount in the blood is so small, minor changes to potassium levels in the blood can be life-threatening. ?What are the causes? ?This condition may be caused by: ?Antibiotic medicine. ?Diarrhea or vomiting. Taking too much of a medicine that helps you have a bowel movement (laxative) can cause diarrhea and lead to hypokalemia. ?Chronic kidney disease (CKD). ?Medicines that help the body get rid of excess fluid (diuretics). ?Eating disorders, such as bulimia. ?Low magnesium levels in the body. ?Sweating a lot. ?What are the signs or symptoms? ?Symptoms of this condition include: ?Weakness. ?Constipation. ?Fatigue. ?Muscle cramps. ?Mental confusion. ?Skipped heartbeats or irregular heartbeat (palpitations). ?Tingling or numbness. ?How is this diagnosed? ?This condition is diagnosed with a blood test. ?How is this treated? ?This condition may be treated by: ?Taking potassium supplements by mouth. ?Adjusting the medicines that you take. ?Eating more foods that contain a lot of potassium. ?If your potassium level is very low, you may need to get potassium through an IV and be monitored in the hospital. ?Follow these instructions at home: ? ?Take over-the-counter and prescription medicines only as told by your health care provider. This includes vitamins and supplements. ?Eat a healthy diet. A healthy diet includes fresh fruits and vegetables, whole grains, healthy fats, and lean proteins. ?If instructed, eat more foods that contain a lot of potassium. This includes: ?Nuts, such as peanuts and pistachios. ?Seeds, such as sunflower seeds and pumpkin seeds. ?Peas, lentils, and lima beans. ?Whole grain and bran  cereals and breads. ?Fresh fruits and vegetables, such as apricots, avocado, bananas, cantaloupe, kiwi, oranges, tomatoes, asparagus, and potatoes. ?Orange juice. ?Tomato juice. ?Red meats. ?Yogurt. ?Keep all follow-up visits as told by your health care provider. This is important. ?Contact a health care provider if you: ?Have weakness that gets worse. ?Feel your heart pounding or racing. ?Vomit. ?Have diarrhea. ?Have diabetes (diabetes mellitus) and you have trouble keeping your blood sugar (glucose) in your target range. ?Get help right away if you: ?Have chest pain. ?Have shortness of breath. ?Have vomiting or diarrhea that lasts for more than 2 days. ?Faint. ?Summary ?Hypokalemia means that the amount of potassium in the blood is lower than normal. ?This condition is diagnosed with a blood test. ?Hypokalemia may be treated by taking potassium supplements, adjusting the medicines that you take, or eating more foods that are high in potassium. ?If your potassium level is very low, you may need to get potassium through an IV and be monitored in the hospital. ?This information is not intended to replace advice given to you by your health care provider. Make sure you discuss any questions you have with your health care provider. ?Document Revised: 09/03/2017 Document Reviewed: 09/04/2017 ?Elsevier Patient Education ? [employer]. ? ?

## 2021-05-08 NOTE — Progress Notes (Signed)
Patient seen by Dr. Benay Spice today ? ?Vitals are within treatment parameters. ? ?Labs reviewed by Dr. Benay Spice and are not all within treatment parameters. KCL = 3.0 and Mg* 1.3 with total bili 1.7 ? ?Per physician team, patient will not be receiving treatment today. ?Will receive IV Mg+ and K+ today in office.  ?

## 2021-05-09 ENCOUNTER — Encounter (HOSPITAL_COMMUNITY): Payer: Self-pay | Admitting: Emergency Medicine

## 2021-05-09 ENCOUNTER — Ambulatory Visit (HOSPITAL_COMMUNITY)
Admission: RE | Admit: 2021-05-09 | Discharge: 2021-05-09 | Disposition: A | Discharge status: Home / Self Care | Payer: BC Managed Care – PPO | Source: Ambulatory Visit | Attending: Oncology | Admitting: Oncology

## 2021-05-09 DIAGNOSIS — C189 Malignant neoplasm of colon, unspecified: Secondary | ICD-10-CM | POA: Diagnosis not present

## 2021-05-09 DIAGNOSIS — C787 Secondary malignant neoplasm of liver and intrahepatic bile duct: Secondary | ICD-10-CM | POA: Insufficient documentation

## 2021-05-09 DIAGNOSIS — R188 Other ascites: Secondary | ICD-10-CM | POA: Diagnosis not present

## 2021-05-09 LAB — BODY FLUID CELL COUNT WITH DIFFERENTIAL
Lymphs, Fluid: 22 %
Monocyte-Macrophage-Serous Fluid: 50 % (ref 50–90)
Neutrophil Count, Fluid: 28 % — ABNORMAL HIGH (ref 0–25)
Total Nucleated Cell Count, Fluid: 81 cu mm (ref 0–1000)

## 2021-05-09 MED ORDER — LIDOCAINE HCL 1 % IJ SOLN
INTRAMUSCULAR | Status: AC
  Filled 2021-05-09: qty 20

## 2021-05-09 NOTE — Procedures (Signed)
PROCEDURE SUMMARY: ? ?Successful US guided diagnostic and therapeutic paracentesis from RLQ.  ?Yielded 2.8 L of clear, yellow fluid.  ?No immediate complications.  ?Pt tolerated well.  ? ?Specimen was sent for labs. ? ?EBL < 1 mL ? ?Tyson Alias, AGNP ?05/09/2021 ?3:18 PM ?  ?

## 2021-05-09 NOTE — Progress Notes (Signed)
[employer] F[employer]A paperwork completed and faxed to [phone] (conformation [phone]).  Sent to be scanned into chart. ?

## 2021-05-10 ENCOUNTER — Encounter: Payer: Self-pay | Admitting: Oncology

## 2021-05-10 ENCOUNTER — Inpatient Hospital Stay: Payer: BC Managed Care – PPO

## 2021-05-11 LAB — CYTOLOGY - NON PAP

## 2021-05-12 ENCOUNTER — Other Ambulatory Visit: Payer: Self-pay

## 2021-05-12 ENCOUNTER — Inpatient Hospital Stay (HOSPITAL_BASED_OUTPATIENT_CLINIC_OR_DEPARTMENT_OTHER): Payer: BC Managed Care – PPO | Admitting: Oncology

## 2021-05-12 ENCOUNTER — Encounter: Payer: Self-pay | Admitting: Oncology

## 2021-05-12 ENCOUNTER — Inpatient Hospital Stay: Payer: BC Managed Care – PPO

## 2021-05-12 VITALS — BP 142/89 | HR 109 | Temp 97.8°F | Resp 20 | Wt 162.6 lb

## 2021-05-12 DIAGNOSIS — C787 Secondary malignant neoplasm of liver and intrahepatic bile duct: Secondary | ICD-10-CM | POA: Diagnosis not present

## 2021-05-12 DIAGNOSIS — C189 Malignant neoplasm of colon, unspecified: Secondary | ICD-10-CM

## 2021-05-12 DIAGNOSIS — Z933 Colostomy status: Secondary | ICD-10-CM | POA: Diagnosis not present

## 2021-05-12 DIAGNOSIS — C182 Malignant neoplasm of ascending colon: Secondary | ICD-10-CM | POA: Diagnosis not present

## 2021-05-12 DIAGNOSIS — R21 Rash and other nonspecific skin eruption: Secondary | ICD-10-CM | POA: Diagnosis not present

## 2021-05-12 DIAGNOSIS — C7961 Secondary malignant neoplasm of right ovary: Secondary | ICD-10-CM | POA: Diagnosis not present

## 2021-05-12 DIAGNOSIS — R188 Other ascites: Secondary | ICD-10-CM | POA: Diagnosis not present

## 2021-05-12 DIAGNOSIS — D696 Thrombocytopenia, unspecified: Secondary | ICD-10-CM | POA: Diagnosis not present

## 2021-05-12 DIAGNOSIS — Z9049 Acquired absence of other specified parts of digestive tract: Secondary | ICD-10-CM | POA: Diagnosis not present

## 2021-05-12 DIAGNOSIS — Z79899 Other long term (current) drug therapy: Secondary | ICD-10-CM | POA: Diagnosis not present

## 2021-05-12 DIAGNOSIS — R162 Hepatomegaly with splenomegaly, not elsewhere classified: Secondary | ICD-10-CM | POA: Diagnosis not present

## 2021-05-12 LAB — CMP (CANCER CENTER ONLY)
ALT: 17 U/L (ref 0–44)
AST: 30 U/L (ref 15–41)
Albumin: 3.2 g/dL — ABNORMAL LOW (ref 3.5–5.0)
Alkaline Phosphatase: 140 U/L — ABNORMAL HIGH (ref 38–126)
Anion gap: 13 (ref 5–15)
BUN: 11 mg/dL (ref 6–20)
CO2: 24 mmol/L (ref 22–32)
Calcium: 9.3 mg/dL (ref 8.9–10.3)
Chloride: 95 mmol/L — ABNORMAL LOW (ref 98–111)
Creatinine: 0.61 mg/dL (ref 0.44–1.00)
GFR, Estimated: >60 mL/min (ref >60)
Glucose, Bld: 326 mg/dL — ABNORMAL HIGH (ref 70–99)
Potassium: 3.8 mmol/L (ref 3.5–5.1)
Sodium: 132 mmol/L — ABNORMAL LOW (ref 135–145)
Total Bilirubin: 1.7 mg/dL — ABNORMAL HIGH (ref 0.3–1.2)
Total Protein: 7.3 g/dL (ref 6.5–8.1)

## 2021-05-12 LAB — MAGNESIUM: Magnesium: 1.5 mg/dL — ABNORMAL LOW (ref 1.7–2.4)

## 2021-05-12 MED ORDER — MAGNESIUM OXIDE -MG SUPPLEMENT 400 (240 MG) MG PO TABS: 1.0000 | ORAL_TABLET | Freq: Two times a day (BID) | ORAL | 3 refills | Status: DC

## 2021-05-12 MED ORDER — POTASSIUM CHLORIDE ER 10 MEQ PO CPCR: 10.0000 meq | ORAL_CAPSULE | Freq: Every day | ORAL | 2 refills | Status: DC

## 2021-05-12 MED ORDER — HYDROXYZINE HCL 25 MG PO TABS
25.0000 mg | ORAL_TABLET | Freq: Every evening | ORAL | 5 refills | Status: AC
Start: 2021-05-12 — End: ?

## 2021-05-12 NOTE — Telephone Encounter (Signed)
Pt came in today to see Dr Benay Spice. Lab work done. Potassium 3.8 and magnesium 1.5 prescriptions sent to the pharmacy per Dr Benay Spice. ?

## 2021-05-12 NOTE — Progress Notes (Signed)
?[place] ?OFFICE PROGRESS NOTE ? ? ?Diagnosis: Colon cancer ? ?INTERVAL HISTORY:  ? ?Ms. Frier returns as scheduled.  She received intravenous magnesium and potassium supplementation on 05/08/2021.  She underwent a paracentesis for 2.8 L of ascites on 05/09/2021.  The culture and cytology returned negative. ?She continues to feel weak.  She has tremors of the hands.  No seizure.  No muscle cramps.  Her legs remain swollen.  The abdominal tension has returned.  No fever. ? ?Objective: ? ?Vital signs in last 24 hours: ? ?Blood pressure (!) 142/89, pulse (!) 109, temperature 97.8 ?F (36.6 ?C), temperature source Temporal, resp. rate 20, weight 162 lb 9.6 oz (73.8 kg), SpO2 100 %. ?  ? ?Resp: Lungs clear bilaterally ?Cardio: Regular rate and rhythm, 2/6 systolic murmur ?GI: Distended, diffuse firmness of the abdominal wall without a discrete mass ?Vascular: 1+ pitting edema throughout the legs bilaterally ?Neuro: Alert and oriented, mild resting tremor of the hands  ? ?Portacath/PICC-without erythema ? ?Lab Results: ? ?Lab Results  ?Component Value Date  ? WBC 10.1 05/08/2021  ? HGB 9.6 (L) 05/08/2021  ? HCT 32.2 (L) 05/08/2021  ? MCV 77.8 (L) 05/08/2021  ? PLT 125 (L) 05/08/2021  ? NEUTROABS 6.0 05/08/2021  ? ? ?CMP  ?Lab Results  ?Component Value Date  ? NA 135 05/08/2021  ? K 3.0 (L) 05/08/2021  ? CL 97 (L) 05/08/2021  ? CO2 26 05/08/2021  ? GLUCOSE 275 (H) 05/08/2021  ? BUN 10 05/08/2021  ? CREATININE 0.49 05/08/2021  ? CALCIUM 9.1 05/08/2021  ? PROT 7.3 05/08/2021  ? ALBUMIN 3.5 05/08/2021  ? AST 29 05/08/2021  ? ALT 18 05/08/2021  ? ALKPHOS 150 (H) 05/08/2021  ? BILITOT 1.7 (H) 05/08/2021  ? GFRNONAA >60 05/08/2021  ? GFRAA >60 11/05/2019  ? ? ?Lab Results  ?Component Value Date  ? CEA1 43.43 (H) 06/30/2020  ? CEA 155.12 (H) 04/24/2021  ? CA125 14 11/26/2014  ? ? ?Lab Results  ?Component Value Date  ? INR 1.7 (H) 04/25/2021  ? LABPROT 20.1 (H) 04/25/2021  ? ? ?Imaging: ? ?US Paracentesis ? 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 Patient is status post Y 90. Patient presented to ED with new onset abdominal pain and was found to have ascites. Request for diagnostic and therapeutic paracentesis. EXAM: ULTRASOUND GUIDED DIAGNOSTIC AND THERAPEUTIC RIGHT LOWER QUADRANT PARACENTESIS MEDICATIONS: 10 mL 1 % lidocaine COMPLICATIONS: None immediate. PROCEDURE: Informed written consent was obtained from the patient after a discussion of the risks, benefits and alternatives to treatment. A timeout was performed prior to the initiation of the procedure. Initial ultrasound scanning demonstrates a large amount of ascites within the right lower abdominal quadrant. The right lower abdomen was prepped and draped in the usual sterile fashion. 1% lidocaine was used for local anesthesia. Following this, a 19 gauge, 7-cm, Yueh catheter was introduced. An ultrasound image was saved for documentation purposes. The paracentesis was performed. The catheter was removed and a dressing was applied. The patient tolerated the procedure well without immediate post procedural complication. Scratch FINDINGS: A total of approximately 2.8 L of clear, yellow fluid was removed. Samples were sent to the laboratory as requested by the clinical team. IMPRESSION: Successful ultrasound-guided paracentesis yielding 2.8 liters of peritoneal fluid. Read by: Narda Rutherford, AGNP-BC Electronically Signed   By: Aletta Edouard M.D.   On: 05/09/2021 16:44   ? ?Medications: I have reviewed  the patient's current medications. ? ? 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 ?The tumor returned microsatellite stable with no loss of mismatch repair protein expression   ?APC mutated. No BRAF, KRAS, or NRAS mutation On Foundation 1 testing   ?Cycle 1 adjuvant FOLFOX  09/08/2013   ?Cycle 2 adjuvant FOLFOX 09/24/2013   ?Cycle 3 adjuvant FOLFOX 10/08/2013.   ?Cycle 4 adjuvant FOLFOX 10/22/2013.   ?Cycle 5 adjuvant FOLFOX 11/05/2013. Oxaliplatin held due to thrombocytopenia. ?Cycle 6 FOLFOX 11/19/2013. ?Cycle 7 FOLFOX 12/03/2013. Oxaliplatin held secondary to thrombocytopenia. ?Cycle 8 FOLFOX 12/17/2013. ?Cycle 9 FOLFOX 01/04/2014. Oxaliplatin held secondary to neutropenia.   ?Cycle 10 FOLFOX 01/21/2014. Oxaliplatin held secondary to thrombocytopenia. ?Cycle 11 FOLFOX 02/04/2014 ?Cycle 12 FOLFOX 02/18/2014, oxaliplatin dose reduced secondary tothrombocytopenia ?CT abdomen/pelvis 01/30/2014 revealed splenomegaly and no evidence of recurrent colon cancer ?CT chest 04/07/2014 with a stable right lower lobe nodule and no evidence for metastatic disease, no nodules seen on the CT 11/26/2014 ?Markedly elevated CEA 11/24/2014 ?CT 11/26/2014 revealed a right pelvic mass, splenomegaly, small volume ascites ?Right salpingo-oophorectomy 12/28/2014 with the pathology confirming metastatic colon cancer ?CTs 03/23/2016-no evidence of recurrent or metastatic disease. ?CT 11/26/2016-enlargement of a fluid density structure the right pelvic sidewall, no other evidence of metastatic disease ?CT aspiration right pelvic cyst 12/19/2016.  Cytology-BENIGN REACTIVE/REPARATIVE CHANGES. ?CTs 06/05/2017- no evidence of metastatic disease, mild cirrhotic changes with splenomegaly ?CTs 12/11/2017- recurrent cystic right adnexal mass, similar to on the CT 11/26/2016, stable mild splenomegaly ?CTs 04/23/2018- enlargement of cystic right adnexal mass with mural nodularity, no other evidence of metastatic disease ?Cytoreductive surgery/HIPEC with mitomycin by Dr. Clovis Riley at [facility] 08/12/2018-R1 resection achieved.  Cytoreduction included omentectomy, LAR, right salpingo-oophorectomy and left colonic gutter/pelvic stripping.  Pathology on the rectum showed recurrent/metastatic adenocarcinoma, tumor 2.0 cm, predominantly  involving the subserosa and muscularis propria of the colon, proximal and distal margins of resection were negative, vascular invasion present, metastatic carcinoma present in 1 out of 5 lymph nodes; omentum resection with no malignancy seen, no metastatic carcinoma identified in 1 lymph node examined; left gutter stripping positive metastatic adenocarcinoma; right ovary resection positive metastatic adenocarcinoma. 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 ?CEA 68 02/03/2019 ?Biopsy liver lesion 02/11/2019-metastatic adenocarcinoma consistent with primary colonic adenocarcinoma ?CTs 02/27/2019-multiple liver lesions increased in size, new lesion in the lateral right lobe of the liver.  No evidence of metastatic disease in the chest.  Redemonstrated moderate left hydronephrosis and proximal hydroureter without discrete lesion or obstructing etiology at the transition point of the mid ureter. ?Cycle 1 FOLFIRI/Panitumumab 03/12/2019 ?Cycle 2 FOLFIRI/Panitumumab 03/26/2019-bolus 5-FU and irinotecan held secondary to neutropenia ?Cycle 3 FOLFIRI/Panitumumab 04/09/2019, Udenyca added-not given secondary to seizure/discontinuation of the 5-FU pump ?CT abdomen/pelvis at [facility] 05/04/2019-no residual fluid collection at the left abdominal wall abscess, stable moderate left hydronephrosis, multifocal indeterminate liver lesions--liver lesions significantly improved ?Cycle 4 FOLFIRI/Panitumumab 05/07/2019, Udenyca ?Cycle 5 FOLFIRI/Panitumumab 05/20/2019, Udenyca ?Cycle 6 FOLFIRI/Panitumumab 06/18/2019, Udenyca ?Cycle 7 FOLFIRI/Panitumumab 07/01/2019 (Irinotecan dose reduced due to thrombocytopenia), Udenyca--Udenyca was not given ?Cycle 8 FOLFIRI/Panitumumab 07/15/2019, Udenyca ?Cycle 9 FOLFIRI/Panitumumab 07/29/2019, Udenyca ?Cycle 10 FOLFIRI/Panitumumab 08/13/2019, Udenyca ?CTs at [facility] [place] 08/19/2019-decreased size of the hypodense and hyperdense lesions within  segment 2 of the left hepatic lobe.  No new lesions identified.  Similar presacral soft tissue thickening with adjacent stable alignment in the rectum.  Improved but persistent left UPJ obstruction with mild l

## 2021-05-13 LAB — BODY FLUID CULTURE W GRAM STAIN
Culture: NO GROWTH
Gram Stain: NONE SEEN

## 2021-05-15 ENCOUNTER — Ambulatory Visit (HOSPITAL_COMMUNITY)
Admission: RE | Admit: 2021-05-15 | Discharge: 2021-05-15 | Disposition: A | Discharge status: Home / Self Care | Payer: BC Managed Care – PPO | Source: Ambulatory Visit | Attending: Oncology | Admitting: Oncology

## 2021-05-15 DIAGNOSIS — C189 Malignant neoplasm of colon, unspecified: Secondary | ICD-10-CM | POA: Insufficient documentation

## 2021-05-15 DIAGNOSIS — Z85038 Personal history of other malignant neoplasm of large intestine: Secondary | ICD-10-CM | POA: Diagnosis not present

## 2021-05-15 DIAGNOSIS — R188 Other ascites: Secondary | ICD-10-CM | POA: Diagnosis not present

## 2021-05-15 DIAGNOSIS — C787 Secondary malignant neoplasm of liver and intrahepatic bile duct: Secondary | ICD-10-CM | POA: Insufficient documentation

## 2021-05-15 MED ORDER — LIDOCAINE HCL 1 % IJ SOLN
INTRAMUSCULAR | Status: AC
Start: 2021-05-15 — End: 2021-05-15
  Administered 2021-05-15: 15 mL
  Filled 2021-05-15: qty 20

## 2021-05-15 NOTE — Procedures (Signed)
Ultrasound-guided diagnostic and therapeutic paracentesis performed yielding 1.7 liters of clear, light yellow fluid. No immediate complications. The fluid was sent to the lab for cytology. EBL none.  ? ?

## 2021-05-16 ENCOUNTER — Encounter: Payer: Self-pay | Admitting: Oncology

## 2021-05-16 ENCOUNTER — Other Ambulatory Visit: Payer: Self-pay | Admitting: Oncology

## 2021-05-16 DIAGNOSIS — C182 Malignant neoplasm of ascending colon: Secondary | ICD-10-CM

## 2021-05-17 ENCOUNTER — Encounter: Payer: Self-pay | Admitting: Internal Medicine

## 2021-05-18 ENCOUNTER — Encounter: Payer: Self-pay | Admitting: Oncology

## 2021-05-18 LAB — CYTOLOGY - NON PAP

## 2021-05-22 ENCOUNTER — Inpatient Hospital Stay: Payer: BC Managed Care – PPO

## 2021-05-22 ENCOUNTER — Encounter: Payer: Self-pay | Admitting: Nurse Practitioner

## 2021-05-22 ENCOUNTER — Inpatient Hospital Stay (HOSPITAL_BASED_OUTPATIENT_CLINIC_OR_DEPARTMENT_OTHER): Payer: BC Managed Care – PPO | Admitting: Nurse Practitioner

## 2021-05-22 ENCOUNTER — Other Ambulatory Visit: Payer: Self-pay | Admitting: *Deleted

## 2021-05-22 ENCOUNTER — Encounter: Payer: Self-pay | Admitting: *Deleted

## 2021-05-22 VITALS — BP 145/82 | HR 61 | Temp 98.1°F | Resp 18 | Ht 66.0 in | Wt 167.0 lb

## 2021-05-22 VITALS — BP 128/81 | HR 92

## 2021-05-22 DIAGNOSIS — R188 Other ascites: Secondary | ICD-10-CM | POA: Diagnosis not present

## 2021-05-22 DIAGNOSIS — C189 Malignant neoplasm of colon, unspecified: Secondary | ICD-10-CM

## 2021-05-22 DIAGNOSIS — Z9049 Acquired absence of other specified parts of digestive tract: Secondary | ICD-10-CM | POA: Diagnosis not present

## 2021-05-22 DIAGNOSIS — Z95828 Presence of other vascular implants and grafts: Secondary | ICD-10-CM

## 2021-05-22 DIAGNOSIS — C787 Secondary malignant neoplasm of liver and intrahepatic bile duct: Secondary | ICD-10-CM | POA: Diagnosis not present

## 2021-05-22 DIAGNOSIS — C7961 Secondary malignant neoplasm of right ovary: Secondary | ICD-10-CM | POA: Diagnosis not present

## 2021-05-22 DIAGNOSIS — D696 Thrombocytopenia, unspecified: Secondary | ICD-10-CM | POA: Diagnosis not present

## 2021-05-22 DIAGNOSIS — R21 Rash and other nonspecific skin eruption: Secondary | ICD-10-CM | POA: Diagnosis not present

## 2021-05-22 DIAGNOSIS — Z933 Colostomy status: Secondary | ICD-10-CM | POA: Diagnosis not present

## 2021-05-22 DIAGNOSIS — R162 Hepatomegaly with splenomegaly, not elsewhere classified: Secondary | ICD-10-CM | POA: Diagnosis not present

## 2021-05-22 DIAGNOSIS — Z79899 Other long term (current) drug therapy: Secondary | ICD-10-CM | POA: Diagnosis not present

## 2021-05-22 DIAGNOSIS — C182 Malignant neoplasm of ascending colon: Secondary | ICD-10-CM | POA: Diagnosis not present

## 2021-05-22 LAB — CBC WITH DIFFERENTIAL (CANCER CENTER ONLY)
Abs Immature Granulocytes: 0.02 10*3/uL (ref 0.00–0.07)
Basophils Absolute: 0 10*3/uL (ref 0.0–0.1)
Basophils Relative: 0 %
Eosinophils Absolute: 0 10*3/uL (ref 0.0–0.5)
Eosinophils Relative: 0 %
HCT: 29.9 % — ABNORMAL LOW (ref 36.0–46.0)
Hemoglobin: 9 g/dL — ABNORMAL LOW (ref 12.0–15.0)
Immature Granulocytes: 1 %
Lymphocytes Relative: 13 %
Lymphs Abs: 0.5 10*3/uL — ABNORMAL LOW (ref 0.7–4.0)
MCH: 24.5 pg — ABNORMAL LOW (ref 26.0–34.0)
MCHC: 30.1 g/dL (ref 30.0–36.0)
MCV: 81.3 fL (ref 80.0–100.0)
Monocytes Absolute: 0.3 10*3/uL (ref 0.1–1.0)
Monocytes Relative: 8 %
Neutro Abs: 3 10*3/uL (ref 1.7–7.7)
Neutrophils Relative %: 78 %
Platelet Count: 188 10*3/uL (ref 150–400)
RBC: 3.68 MIL/uL — ABNORMAL LOW (ref 3.87–5.11)
RDW: 23.2 % — ABNORMAL HIGH (ref 11.5–15.5)
WBC Count: 3.9 10*3/uL — ABNORMAL LOW (ref 4.0–10.5)
nRBC: 0 % (ref 0.0–0.2)

## 2021-05-22 LAB — CMP (CANCER CENTER ONLY)
ALT: 12 U/L (ref 0–44)
AST: 24 U/L (ref 15–41)
Albumin: 2.9 g/dL — ABNORMAL LOW (ref 3.5–5.0)
Alkaline Phosphatase: 162 U/L — ABNORMAL HIGH (ref 38–126)
Anion gap: 11 (ref 5–15)
BUN: 9 mg/dL (ref 6–20)
CO2: 23 mmol/L (ref 22–32)
Calcium: 8.9 mg/dL (ref 8.9–10.3)
Chloride: 101 mmol/L (ref 98–111)
Creatinine: 0.48 mg/dL (ref 0.44–1.00)
GFR, Estimated: >60 mL/min (ref >60)
Glucose, Bld: 236 mg/dL — ABNORMAL HIGH (ref 70–99)
Potassium: 3.5 mmol/L (ref 3.5–5.1)
Sodium: 135 mmol/L (ref 135–145)
Total Bilirubin: 1.1 mg/dL (ref 0.3–1.2)
Total Protein: 7.2 g/dL (ref 6.5–8.1)

## 2021-05-22 LAB — CEA (ACCESS): CEA (CHCC): 35.51 ng/mL — ABNORMAL HIGH (ref 0.00–5.00)

## 2021-05-22 LAB — MAGNESIUM: Magnesium: 1.6 mg/dL — ABNORMAL LOW (ref 1.7–2.4)

## 2021-05-22 MED ORDER — SPIRONOLACTONE 50 MG PO TABS: 50.0000 mg | ORAL_TABLET | Freq: Every day | ORAL | 1 refills | Status: DC

## 2021-05-22 MED ORDER — MAGNESIUM SULFATE 2 GM/50ML IV SOLN
2.0000 g | Freq: Once | INTRAVENOUS | Status: AC
  Administered 2021-05-22: 2 g via INTRAVENOUS
  Filled 2021-05-22: qty 50

## 2021-05-22 MED ORDER — PANTOPRAZOLE SODIUM 40 MG PO TBEC: 40.0000 mg | DELAYED_RELEASE_TABLET | Freq: Two times a day (BID) | ORAL | 1 refills | Status: DC

## 2021-05-22 MED ORDER — FUROSEMIDE 20 MG PO TABS: 20.0000 mg | ORAL_TABLET | Freq: Every day | ORAL | 1 refills | Status: AC

## 2021-05-22 MED ORDER — HEPARIN SOD (PORK) LOCK FLUSH 100 UNIT/ML IV SOLN
500.0000 [IU] | Freq: Once | INTRAVENOUS | Status: AC
  Administered 2021-05-22: 500 [IU]

## 2021-05-22 MED ORDER — SODIUM CHLORIDE 0.9% FLUSH
10.0000 mL | Freq: Once | INTRAVENOUS | Status: AC
  Administered 2021-05-22: 10 mL

## 2021-05-22 MED ORDER — MAGNESIUM SULFATE 2 GM/50ML IV SOLN: 2.0000 g | Freq: Once | INTRAVENOUS | Status: DC

## 2021-05-22 NOTE — Patient Instructions (Signed)
Magnesium Sulfate Injection What is this medication? MAGNESIUM SULFATE (mag NEE zee um SUL fate) prevents and treats low levels of magnesium in your body. It may also be used to prevent and treat seizures during pregnancy in people with high blood pressure disorders, such as preeclampsia or eclampsia. Magnesium plays an important role in maintaining the health of your muscles and nervous system. This medicine may be used for other purposes; ask your health care provider or pharmacist if you have questions. What should I tell my care team before I take this medication? They need to know if you have any of these conditions: Heart disease History of irregular heart beat Kidney disease An unusual or allergic reaction to magnesium sulfate, medications, foods, dyes, or preservatives Pregnant or trying to get pregnant Breast-feeding How should I use this medication? This medication is for infusion into a vein. It is given in a hospital or clinic setting. Talk to your care team about the use of this medication in children. While this medication may be prescribed for selected conditions, precautions do apply. Overdosage: If you think you have taken too much of this medicine contact a poison control center or emergency room at once. NOTE: This medicine is only for you. Do not share this medicine with others. What if I miss a dose? This does not apply. What may interact with this medication? Certain medications for anxiety or sleep Certain medications for seizures like phenobarbital Digoxin Medications that relax muscles for surgery Narcotic medications for pain This list may not describe all possible interactions. Give your health care provider a list of all the medicines, herbs, non-prescription drugs, or dietary supplements you use. Also tell them if you smoke, drink alcohol, or use illegal drugs. Some items may interact with your medicine. What should I watch for while using this medication? Your  condition will be monitored carefully while you are receiving this medication. You may need blood work done while you are receiving this medication. What side effects may I notice from receiving this medication? Side effects that you should report to your care team as soon as possible: Allergic reactions--skin rash, itching, hives, swelling of the face, lips, tongue, or throat High magnesium level--confusion, drowsiness, facial flushing, redness, sweating, muscle weakness, fast or irregular heartbeat, trouble breathing Low blood pressure--dizziness, feeling faint or lightheaded, blurry vision Side effects that usually do not require medical attention (report to your care team if they continue or are bothersome): Headache Nausea This list may not describe all possible side effects. Call your doctor for medical advice about side effects. You may report side effects to FDA at 1-800-FDA-1088. Where should I keep my medication? This medication is given in a hospital or clinic and will not be stored at home. NOTE: This sheet is a summary. It may not cover all possible information. If you have questions about this medicine, talk to your doctor, pharmacist, or health care provider.  2023 Elsevier/Gold Standard (2020-04-07 00:00:00)  

## 2021-05-22 NOTE — Progress Notes (Signed)
?[place] ?OFFICE PROGRESS NOTE ? ? ?Diagnosis: Colon cancer ? ?INTERVAL HISTORY:  ? ?[name] returns as scheduled.  She had another paracentesis on 05/15/2021 with 1.7 L of fluid removed.  There were no malignant cells.  She notes no significant improvement in the abdominal distention.  She has persistent lower extremity edema.  She has intermittent pain at the right upper abdomen.  The pain tends to occur after eating.  She notes rectal bleeding after taking ibuprofen. ? ?Objective: ? ?Vital signs in last 24 hours: ? ?Blood pressure (!) 145/82, pulse 61, temperature 98.1 ?F (36.7 ?C), temperature source Oral, resp. rate 18, height _0  (1.676 m), weight 167 lb (75.8 kg), SpO2 97 %. ?  ? ?HEENT: No thrush or ulcers. ?Resp: Lungs clear bilaterally. ?Cardio: Regular rate and rhythm. ?GI: Abdomen is distended, firm. ?Vascular: Pitting edema lower leg bilaterally. ?Neuro: Alert and oriented. ?Skin: Palms without erythema. ?Port-A-Cath without erythema. ? ?Lab Results: ? ?Lab Results  ?Component Value Date  ? WBC 3.9 (L) 05/22/2021  ? HGB 9.0 (L) 05/22/2021  ? HCT 29.9 (L) 05/22/2021  ? MCV 81.3 05/22/2021  ? PLT 188 05/22/2021  ? NEUTROABS 3.0 05/22/2021  ? ? ?Imaging: ? ?No results found. ? ?Medications: I have reviewed the patient's current medications. ? 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 ?The tumor returned microsatellite stable with no loss of mismatch repair protein expression   ?APC mutated. No BRAF, KRAS, or NRAS mutation On Foundation 1 testing   ?Cycle 1 adjuvant FOLFOX 09/08/2013   ?Cycle 2 adjuvant FOLFOX 09/24/2013   ?Cycle 3 adjuvant FOLFOX 10/08/2013.   ?Cycle 4 adjuvant FOLFOX 10/22/2013.   ?Cycle 5 adjuvant FOLFOX 11/05/2013. Oxaliplatin held due to thrombocytopenia. ?Cycle 6 FOLFOX 11/19/2013. ?Cycle 7 FOLFOX 12/03/2013. Oxaliplatin held secondary to  thrombocytopenia. ?Cycle 8 FOLFOX 12/17/2013. ?Cycle 9 FOLFOX 01/04/2014. Oxaliplatin held secondary to neutropenia.   ?Cycle 10 FOLFOX 01/21/2014. Oxaliplatin held secondary to thrombocytopenia. ?Cycle 11 FOLFOX 02/04/2014 ?Cycle 12 FOLFOX 02/18/2014, oxaliplatin dose reduced secondary tothrombocytopenia ?CT abdomen/pelvis 01/30/2014 revealed splenomegaly and no evidence of recurrent colon cancer ?CT chest 04/07/2014 with a stable right lower lobe nodule and no evidence for metastatic disease, no nodules seen on the CT 11/26/2014 ?Markedly elevated CEA 11/24/2014 ?CT 11/26/2014 revealed a right pelvic mass, splenomegaly, small volume ascites ?Right salpingo-oophorectomy 12/28/2014 with the pathology confirming metastatic colon cancer ?CTs 03/23/2016-no evidence of recurrent or metastatic disease. ?CT 11/26/2016-enlargement of a fluid density structure the right pelvic sidewall, no other evidence of metastatic disease ?CT aspiration right pelvic cyst 12/19/2016.  Cytology-BENIGN REACTIVE/REPARATIVE CHANGES. ?CTs 06/05/2017- no evidence of metastatic disease, mild cirrhotic changes with splenomegaly ?CTs 12/11/2017- recurrent cystic right adnexal mass, similar to on the CT 11/26/2016, stable mild splenomegaly ?CTs 04/23/2018- enlargement of cystic right adnexal mass with mural nodularity, no other evidence of metastatic disease ?Cytoreductive surgery/HIPEC with mitomycin by Dr. Clovis Riley at [facility] 08/12/2018-R1 resection achieved.  Cytoreduction included omentectomy, LAR, right salpingo-oophorectomy and left colonic gutter/pelvic stripping.  Pathology on the rectum showed recurrent/metastatic adenocarcinoma, tumor 2.0 cm, predominantly involving the subserosa and muscularis propria of the colon, proximal and distal margins of resection were negative, vascular invasion present, metastatic carcinoma present in 1 out of 5 lymph nodes; omentum resection with no malignancy seen, no metastatic carcinoma identified in 1 lymph node  examined; left gutter stripping positive metastatic adenocarcinoma; right ovary resection positive metastatic adenocarcinoma. ?CT 12/30/2018-findings consistent with enterocutaneous  fistula, ileus; multiple rounded hypodensities in the liver. ?CEA 68 02/03/2019 ?Biopsy liver lesion 02/11/2019-metastatic adenocarcinoma consistent with primary colonic adenocarcinoma ?CTs 02/27/2019-multiple liver lesions increased in size, new lesion in the lateral right lobe of the liver.  No evidence of metastatic disease in the chest.  Redemonstrated moderate left hydronephrosis and proximal hydroureter without discrete lesion or obstructing etiology at the transition point of the mid ureter. ?Cycle 1 FOLFIRI/Panitumumab 03/12/2019 ?Cycle 2 FOLFIRI/Panitumumab 03/26/2019-bolus 5-FU and irinotecan held secondary to neutropenia ?Cycle 3 FOLFIRI/Panitumumab 04/09/2019, Udenyca added-not given secondary to seizure/discontinuation of the 5-FU pump ?CT abdomen/pelvis at [facility] 05/04/2019-no residual fluid collection at the left abdominal wall abscess, stable moderate left hydronephrosis, multifocal indeterminate liver lesions--liver lesions significantly improved ?Cycle 4 FOLFIRI/Panitumumab 05/07/2019, Udenyca ?Cycle 5 FOLFIRI/Panitumumab 05/20/2019, Udenyca ?Cycle 6 FOLFIRI/Panitumumab 06/18/2019, Udenyca ?Cycle 7 FOLFIRI/Panitumumab 07/01/2019 (Irinotecan dose reduced due to thrombocytopenia), Udenyca--Udenyca was not given ?Cycle 8 FOLFIRI/Panitumumab 07/15/2019, Udenyca ?Cycle 9 FOLFIRI/Panitumumab 07/29/2019, Udenyca ?Cycle 10 FOLFIRI/Panitumumab 08/13/2019, Udenyca ?CTs at [facility] 08/19/2019-decreased size of the hypodense and hyperdense lesions within segment 2 of the left hepatic lobe.  No new lesions identified.  Similar presacral soft tissue thickening with adjacent stable alignment in the rectum.  Improved but persistent left UPJ obstruction with mild left hydronephrosis.  Persistent but decreased size of the wound within the left mid  abdomen abdominal wall. ?Cycle 11 irinotecan/Panitumumab 08/27/2019, Udenyca ?Cycle 12 irinotecan/Panitumumab 10/08/2019, Udenyca ?Cycle 13 irinotecan/Panitumumab 11/05/2019, Udenyca ?CT abdomen/pelvis 11/19/2019-no new or progressive interval findings.  Stable appearance of the calcified lesion in the anterior left hepatic dome with second tiny low-density lesion in the dome of the lateral segment left liver.  Both lesions are markedly decreased since 02/27/2019.  Stable mild fullness left intrarenal collecting system and renal pelvis.  Similar appearance of abnormal soft tissue in the left paramidline subcutaneous fat with associated skin thickening, this likely correlates to the fistula. ?Cycle 14 irinotecan/Panitumumab 11/19/2019, Udenyca ?Cycle 15 irinotecan/Panitumumab 12/02/2019, Udenyca ?CT abdomen/pelvis at [facility] 02/11/2020- similar to slight decrease in segment 2 hypodense and hyperdense lesions (compared to a CT from July 2020), no new lesions, splenomegaly, no fluid collection or abscess, mild stranding adjacent to the incision with overlying skin thickening ?Cycle 16 irinotecan/Panitumumab 03/03/2020, Udenyca ?Cycle 17 irinotecan/Panitumumab 03/17/2020, Udenyca ?Cycle 18 irinotecan/panitumumab 04/07/2020, Udenyca ?Cycle 19 irinotecan/Panitumumab 04/28/2020, Udenyca ?Cycle 20 irinotecan/panitumumab 05/26/2020, Udenyca ?Cycle 21 irinotecan/panitumumab 06/30/2020, Udenyca ?CT abdomen/pelvis 07/19/2020-mildly increased hepatic steatosis.  Increased size of 2.8 cm hypervascular lesion segment 2 left hepatic lobe. ?Cycle 22 irinotecan/Panitumumab 07/21/2020, Udenyca ?MRI Abd 07/28/2020: liver lesions consistent with hepatic metastasis ?Cycle 23 irinotecan/Panitumumab 08/04/2020, Udenyca  ?Cycle 24 irinotecan/panitumumab 08/22/2020 ?Irinotecan/panitumumab discontinued secondary to diarrhea and skin toxicity ?Guardant 360 on 09/12/2020-K-ras G12V, PIK3CA,NTRK2-VUS, tumor mutation burden 6.85, MSI-high not detected ?Referred  for consideration of Y 90 ?CTs 10/11/2020-for enlarging hepatic metastatic lesions identified.  No new lesions.  Several tiny nodules in the lungs in the 2 to 3 mm diameter range.  Mild splenomegaly.  Mild left

## 2021-05-22 NOTE — Patient Instructions (Signed)

## 2021-05-22 NOTE — Progress Notes (Signed)
Patient seen by Ned Card NP today ? ?Vitals are within treatment parameters. ? ?Labs reviewed by Ned Card NP and are within treatment parameters. ?Mg+=1.6 today ? ?Per physician team, patient will not be receiving treatment today.  ?Will only receive 2 grams Mg+ ?

## 2021-05-24 ENCOUNTER — Encounter: Payer: Self-pay | Admitting: Nurse Practitioner

## 2021-05-24 ENCOUNTER — Inpatient Hospital Stay: Payer: BC Managed Care – PPO

## 2021-05-25 ENCOUNTER — Other Ambulatory Visit: Payer: Self-pay

## 2021-05-25 ENCOUNTER — Encounter: Payer: Self-pay | Admitting: Oncology

## 2021-05-25 DIAGNOSIS — C189 Malignant neoplasm of colon, unspecified: Secondary | ICD-10-CM

## 2021-05-26 ENCOUNTER — Encounter: Payer: Self-pay | Admitting: *Deleted

## 2021-05-26 ENCOUNTER — Telehealth: Payer: Self-pay | Admitting: *Deleted

## 2021-05-26 DIAGNOSIS — C189 Malignant neoplasm of colon, unspecified: Secondary | ICD-10-CM

## 2021-05-26 NOTE — Telephone Encounter (Addendum)
Called to request referral to [facility]--Dr. Newman Pies to be evaluated for gallstones on CT scan on 4/1. Reports persistent upper abdominal pain despite the paracentesis. She reached out to the office and was told she needs to be referred. ?Per Dr. Benay Spice: Doubtful it is her gallstones causing her pain, but OK to send referral. Referral order, demographics, office note and CT report to [facility] [phone]. ?

## 2021-05-30 ENCOUNTER — Ambulatory Visit (HOSPITAL_COMMUNITY)
Admission: RE | Admit: 2021-05-30 | Discharge: 2021-05-30 | Disposition: A | Discharge status: Home / Self Care | Payer: BC Managed Care – PPO | Source: Ambulatory Visit | Attending: Oncology | Admitting: Oncology

## 2021-05-30 DIAGNOSIS — R188 Other ascites: Secondary | ICD-10-CM | POA: Diagnosis not present

## 2021-05-30 DIAGNOSIS — C189 Malignant neoplasm of colon, unspecified: Secondary | ICD-10-CM | POA: Insufficient documentation

## 2021-05-30 DIAGNOSIS — C569 Malignant neoplasm of unspecified ovary: Secondary | ICD-10-CM | POA: Diagnosis not present

## 2021-05-30 HISTORY — PX: IR PARACENTESIS: IMG2679

## 2021-05-30 MED ORDER — LIDOCAINE HCL 1 % IJ SOLN
INTRAMUSCULAR | Status: DC | PRN
  Administered 2021-05-30: 10 mL

## 2021-05-30 MED ORDER — LIDOCAINE HCL 1 % IJ SOLN
INTRAMUSCULAR | Status: AC
  Filled 2021-05-30: qty 20

## 2021-05-30 NOTE — Procedures (Signed)
PROCEDURE SUMMARY: ? ?Successful ultrasound guided paracentesis from the RLQ  quadrant.  ?Yielded 2.7 of straw fluid.  ?No immediate complications.  ?The patient tolerated the procedure well.  ? ?Specimen was sent for labs. ? ?EBL < 7m ? ?The patient has required >/=2 paracenteses in a 30 day period and a screening evaluation by the [facility] has been arranged. ? ? ? ?

## 2021-05-31 NOTE — Progress Notes (Signed)
**Note Emily-Identified via Obfuscation** ? ? ? ?06/01/2021 ?[name] ?[id number] ?09-Jun-1975 ? ?Referring provider: de [name], Emily Reveal, MD ?Primary GI doctor: Dr. Henrene [name] ? ?ASSESSMENT AND PLAN:  ? ?History of colon cancer ?Metastatic colon cancer to liver [facility]) ?Follows with Dr. [name] in oncology. ? ?RUQ abdominal pain ?-     Ambulatory referral to Gastroenterology ?-Worse postprandial, is having reflux worse at night. ?-We will schedule HIDA to evaluate gallstones see if this is potentially contributing, unknown if patient surgical candidate slightly doubtful but could do trial of ursodiol  ?-Currently no evidence of obstruction with liver functions and exam, discussed with patient and mother symptoms to go to the ER ?We will start pantoprazole before food continue twice daily ?-With worsening reflux and nonresponsive to pantoprazole with nausea, AB pain we will also schedule for endoscopy to evaluate further. ?Patient and mother agree with this plan. ?If everything is negative very possible it may be from liver mets.  ? ?Other ascites ?Has had paracentesis negative for malignant cells, negative for SBP.  Has not had LDH, proteins, glucose, other labs to evaluate origin ?Just started on Lasix and spironolactone, monitor for response. ? ?Splenomegaly ?-     Normal liver function test, platelets are normal at this time, no evidence of portal hypertension. ?-Question with ascites and splenomegaly with active cancer and chemotherapy if patient needs CT angio versus ultrasound. ?Patient does deny any symptoms of unilateral leg swelling, chest pain, palpitations.  Does have some minor shortness of breath. ?-CT with contrast have not shown any thrombosis ? ?Nausea and vomiting, unspecified vomiting type ?-     NM Hepato W/EF; Future ?-     Ambulatory referral to Gastroenterology ?Will schedule HIDA and EGD ? ?Patient Care Team: ?[name], Emily Reveal, MD as PCP - General (Family Medicine) ?Emily Pier, MD as Consulting Physician (Oncology) ?Emily Partridge, DO as Consulting Physician (Neurology) ? ?HISTORY OF PRESENT ILLNESS: ?46 y.o. female with a past medical history of colon cancer status post right hemicolectomy 2015, status postchemotherapy, recurrence right ovary status post right salpingo-oophorectomy 12/2014, liver metastasis 2022 status post Y90 treatment history of negative genetic studies 2015 followed with Dr. [name],  and others listed below presents for evaluation of abdominal pain and ascites. ? ?Small review of history: ?Patient was last seen by Dr. Henrene [name] 11/2018 after cystoscopy reduction including omentectomy, LAR, R salpingo-oophorectomy, and L colonic gutter/pelvic stripping. R1 resection achieved.  ?11/17/2018 colonoscopy with flex sig per rectum with changes of mild diversion colitis necrotic appearing debris colostomy bag insufflated suggesting some type of communication between pouch and proximal colon. ?Normal-appearing colonic mucosa to the level of previous hemicolectomy ileocolonic anastomosis, excluded distal pouch with similar necrotic appearing debris. ? 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  No malignancy. ? ?CTs 03/16/2021-multiple new small pulmonary nodules.  Response to therapy in the liver, decreasing size of liver lesions with signs of interval radioembolization; findings suspicious for perianastomotic recurrence in the pelvis; new small volume ascites and some right colonic thickening as well as retroperitoneal thickening. ? ?Admission 04/24/2021 for fever of unknown origin. ? ?CT abdomen/pelvis 05/06/2021-increased ascites, stable hypodense liver lesions, splenomegaly, soft tissue density at the left pelvis-stable or decreased in size by my  measurement ?05/15/2021 paracentesis 1.7 L fluid removed no malignant cells, no SBP.  No other  labs done.  Patient does have history of splenomegaly.  No SAAG alible. ?No improvement of abdominal distention post paracentesis, persistent lower extremity edema, persistent intermittent right upper abdominal pain after eating. ?Most recently saw Ned Card at oncology 05/22/2021 ?Started last visit on diuretics that visit lasix 20 and spirolactone 50 mg. . ?Had some rectal bleeding after taking ibuprofen ?Patient has leukopenia and anemia, platelets were normal 188. ?She has some SOB with getting ready, no palpitations, no chest pain.  ?She is not on blood thinner. ? ?Patient presents with RUQ ab pain, worse after eating, anything.  ?She continues to have GERD, can wake her up at night. She is on PPI BID without help. Burns into her throat, will sit up will beltch. She has nausea but no vomiting.  ?Last LFTs were unremarkable, no evidence of obstruction.  ?Patient has had gallstones ?2015 normal EGD, insulin depdendent for 1 month only, only takes tylenol, rare oxycodone.  ?She has tried ibuprofen for 1-2 days, but makes her have BRB.  ?Will eat at 5 or 6 pm, will go to bed at 930.  ?She denies dysphagia, melena ?She denies ETOH use.   ?She reports family history of gallbladder issues with two of her sisters.   ? ? ?05/06/2021 CT abdomen pelvis with contrast showed fatty infiltration of the liver, gallbladder stones were seen no ductal dilatation, normal pancreas, enlarged spleen, prominent submucosal folds in the stomach which may be partially due to incomplete distention, no significant wall thickening in the colon increase in size of ascites. ?05/06/2021 right upper quadrant ultrasound for right upper quadrant pain shows 10 mm echogenic structure in the lumen of the gallbladder not distended no tenderness.  No evidence of acute cholecystitis, fatty liver and small ascites. ?AST 24 ALT 12 ?Alkphos 162 TBili  1.1 ?05/06/2021 LIPASE 31 ? ? ?Current Medications:  ? ?Current Outpatient Medications (Endocrine & Metabolic):  ?  insulin NPH Human (NOVOLIN N) 100 UNIT/ML injection, Inject 0.1 mLs (10 Units total) into the skin at bedtime. ?  metFORMIN (GLUCOPHAGE) 500 MG tablet, Take 1 tablet (500 mg total) by mouth 2 (two) times daily with a meal. ? ?Current Outpatient Medications (Cardiovascular):  ?  furosemide (LASIX) 20 MG tablet, Take 1 tablet (20 mg total) by mouth daily. ?  spironolactone (ALDACTONE) 50 MG tablet, Take 1 tablet (50 mg total) by mouth daily. ? ?Current Outpatient Medications (Respiratory):  ?  diphenhydrAMINE (BENADRYL) 25 MG tablet, Take 50 mg by mouth at bedtime as needed for itching or sleep. ?  magic mouthwash SOLN*, Take 5 mLs by mouth 4 (four) times daily. ? ?Current Outpatient Medications (Analgesics):  ?  acetaminophen (TYLENOL) 500 MG tablet, Take 1,000 mg by mouth every 6 (six) hours as needed for mild pain. ?  oxyCODONE (ROXICODONE) 5 MG immediate release tablet, Take 2 tablets (10 mg total) by mouth every 6 (six) hours as needed for severe pain. ? ? ?Current Outpatient Medications (Other):  ?  Accu-Chek Softclix Lancets lancets, SMARTSIG:2 Topical Twice Daily ?  blood glucose meter kit and supplies, Dispense based on patient and insurance preference. Use up to four times daily as directed. (FOR ICD-10 E10.9, E11.9). ?  hydrOXYzine (ATARAX) 25 MG tablet, Take 1 tablet (25 mg total) by mouth at bedtime. ?  lacosamide (VIMPAT) 200 MG TABS tablet, Take 1 tablet by mouth twice daily (Patient taking differently: Take 200 mg by mouth 2 (two) times daily.) ?  loperamide (IMODIUM) 2 MG capsule, Take 2 mg by mouth  as needed for diarrhea or loose stools. ?  LORazepam (ATIVAN) 0.5 MG tablet, Take 1 tablet by mouth twice daily as needed for anxiety ?  magic mouthwash SOLN*, Take 5 mLs by mouth 4 (four) times daily. ?  magnesium oxide (MAG-OX) 400 (240 Mg) MG tablet, Take 1 tablet (400 mg total) by mouth 2  (two) times daily. ?  pantoprazole (PROTONIX) 40 MG tablet, Take 1 tablet (40 mg total) by mouth 2 (two) times daily. ?  potassium chloride (MICRO-K) 10 MEQ CR capsule, Take 1 capsule (10 mEq total) by mouth daily. ?  proc

## 2021-06-01 ENCOUNTER — Ambulatory Visit: Payer: BC Managed Care – PPO | Admitting: Physician Assistant

## 2021-06-01 ENCOUNTER — Encounter: Payer: Self-pay | Admitting: Physician Assistant

## 2021-06-01 ENCOUNTER — Ambulatory Visit (INDEPENDENT_AMBULATORY_CARE_PROVIDER_SITE_OTHER): Payer: BC Managed Care – PPO | Admitting: Family Medicine

## 2021-06-01 VITALS — BP 128/75 | HR 98 | Temp 97.8°F | Ht 64.0 in | Wt 158.6 lb

## 2021-06-01 VITALS — BP 112/64 | HR 92 | Ht 64.57 in | Wt 159.2 lb

## 2021-06-01 DIAGNOSIS — Z85038 Personal history of other malignant neoplasm of large intestine: Secondary | ICD-10-CM | POA: Diagnosis not present

## 2021-06-01 DIAGNOSIS — C189 Malignant neoplasm of colon, unspecified: Secondary | ICD-10-CM | POA: Diagnosis not present

## 2021-06-01 DIAGNOSIS — Z794 Long term (current) use of insulin: Secondary | ICD-10-CM

## 2021-06-01 DIAGNOSIS — R188 Other ascites: Secondary | ICD-10-CM | POA: Diagnosis not present

## 2021-06-01 DIAGNOSIS — C787 Secondary malignant neoplasm of liver and intrahepatic bile duct: Secondary | ICD-10-CM

## 2021-06-01 DIAGNOSIS — E1165 Type 2 diabetes mellitus with hyperglycemia: Secondary | ICD-10-CM

## 2021-06-01 DIAGNOSIS — R1011 Right upper quadrant pain: Secondary | ICD-10-CM | POA: Diagnosis not present

## 2021-06-01 DIAGNOSIS — R112 Nausea with vomiting, unspecified: Secondary | ICD-10-CM

## 2021-06-01 DIAGNOSIS — R161 Splenomegaly, not elsewhere classified: Secondary | ICD-10-CM

## 2021-06-01 LAB — CYTOLOGY - NON PAP

## 2021-06-01 NOTE — Assessment & Plan Note (Signed)
Eventually, we were able to get patient started on insulin NPH as other long-acting insulins were not being covered by insurance.  She indicates that she is doing 10 units nightly, has been able to do this without issue.  She has been checking her blood sugar both fasting in the morning and in the evening before administering nighttime dose of medication.  Generally her morning blood sugars have been in the 120s or 130s, at night it has been in the low 200s typically.  Nighttime check has usually been about 2 to 3 hours after her last meal.  She is not having any issues with polyuria or polydipsia.  She indicates that she will have some shaking at times, no sweats or feeling as though she may pass out.  She has not checked her blood sugar when she is shaking, however I feel it is less likely that she is having any true hypoglycemia ?Recommend continuing with current medications including metformin, insulin NPH.  We will continue with current 10 units nightly ?Discussed with patient that ideally we would like to keep her blood sugars under modest control to avoid any significant hyperglycemia however do not feel that we need to be aggressive about controlling blood sugars due to risk of hypoglycemia ?We will plan to follow-up in about 4 to 6 weeks with plan for recheck of hemoglobin A1c at that time ?If she is having any issues with high or low blood sugar readings, advised that she contact the office for further recommendations ?

## 2021-06-01 NOTE — Progress Notes (Signed)
? ? ?  Procedures performed today:   ? ?None. ? ?Independent interpretation of notes and tests performed by another provider:  ? ?None. ? ?Brief History, Exam, Impression, and Recommendations:   ? ?BP 128/75   Pulse 98   Temp 97.8 ?F (36.6 ?C)   Ht '5\' 4"'$  (1.626 m)   Wt 158 lb 9.6 oz (71.9 kg)   SpO2 100%   BMI 27.22 kg/m?  ? ?Diabetes mellitus, type 2 ([place]) ?Eventually, we were able to get patient started on insulin NPH as other long-acting insulins were not being covered by insurance.  She indicates that she is doing 10 units nightly, has been able to do this without issue.  She has been checking her blood sugar both fasting in the morning and in the evening before administering nighttime dose of medication.  Generally her morning blood sugars have been in the 120s or 130s, at night it has been in the low 200s typically.  Nighttime check has usually been about 2 to 3 hours after her last meal.  She is not having any issues with polyuria or polydipsia.  She indicates that she will have some shaking at times, no sweats or feeling as though she may pass out.  She has not checked her blood sugar when she is shaking, however I feel it is less likely that she is having any true hypoglycemia ?Recommend continuing with current medications including metformin, insulin NPH.  We will continue with current 10 units nightly ?Discussed with patient that ideally we would like to keep her blood sugars under modest control to avoid any significant hyperglycemia however do not feel that we need to be aggressive about controlling blood sugars due to risk of hypoglycemia ?We will plan to follow-up in about 4 to 6 weeks with plan for recheck of hemoglobin A1c at that time ?If she is having any issues with high or low blood sugar readings, advised that she contact the office for further recommendations ? ? ?___________________________________________ ?Quan Cybulski [name], MD, ABFM, CAQSM ?Primary Care and Sports Medicine ?[place] ?

## 2021-06-01 NOTE — Patient Instructions (Addendum)
Please take your proton pump inhibitor medication 30 minutes to 1 hour before meals- this makes it more effective.  ?Avoid spicy and acidic foods ?Avoid fatty foods ?Limit your intake of coffee, tea, alcohol, and carbonated drinks ?Work to maintain a healthy weight ?Keep the head of the bed elevated at least 3 inches with blocks or a wedge pillow if you are having any nighttime symptoms ?Stay upright for 2 hours after eating ?Avoid meals and snacks three to four hours before bedtime ? ?You have been scheduled for a HIDA scan at [facility] (1st floor) on 06/16/2021. Please arrive 30 minutes prior to your scheduled appointment at  8:33AS Make certain not to have anything to eat or drink at least 6 hours prior to your test. Should this appointment date or time not work well for you, please call radiology scheduling at [phone].  ?____________________________________________________________________XXXXX_ No Oxycodone 6 hours before procedure  ? ?hepatobiliary (HIDA) scan is an imaging procedure used to diagnose problems in the liver, gallbladder and bile ducts. In the HIDA scan, a radioactive chemical or tracer is injected into a vein in your arm. The tracer is handled by the liver like bile. Bile is a fluid produced and excreted by your liver that helps your digestive system break down fats in the foods you eat. Bile is stored in your gallbladder and the gallbladder releases the bile when you eat a meal. A special nuclear medicine scanner (gamma camera) tracks the flow of the tracer from your liver into your gallbladder and small intestine.  ?During your HIDA scan  ?You'll be asked to change into a hospital gown before your HIDA scan begins. Your health care team will position you on a table, usually on your back. The radioactive tracer is then injected into a vein in your arm.The tracer travels through your bloodstream to your liver, where it's taken up by the bile-producing cells. The radioactive tracer  travels with the bile from your liver into your gallbladder and through your bile ducts to your small intestine.You may feel some pressure while the radioactive tracer is injected into your vein. As you lie on the table, a special gamma camera is positioned over your abdomen taking pictures of the tracer as it moves through your body. The gamma camera takes pictures continually for about an hour. You'll need to keep still during the HIDA scan. This can become uncomfortable, but you may find that you can lessen the discomfort by taking deep breaths and thinking about other things. Tell your health care team if you're uncomfortable. The radiologist will watch on a computer the progress of the radioactive tracer through your body. The HIDA scan may be stopped when the radioactive tracer is seen in the gallbladder and enters your small intestine. This typically takes about an hour. In some cases extra imaging will be performed if original images aren't satisfactory, if morphine is given to help visualize the gallbladder or if the medication CCK is given to look at the contraction of the gallbladder. This test typically takes 2 hours to complete. ?________________________________________________________________________  ? ?Gastroparesis ?Please do small frequent meals like 4-6 meals a day.  ?Eat and drink liquids at separate times.  ?Avoid high fiber foods, cook your vegetables, avoid high fat food.  ?Suggest spreading protein throughout the day (greek yogurt, glucerna, soft meat, milk, eggs) ?Choose soft foods that you can mash with a fork ?When you are more symptomatic, change to pureed foods foods and liquids.  ?Consider reading "Living  well with Gastroparesis" by Lambert Keto ?Gastroparesis is a condition in which food takes longer than normal to empty from the stomach. This condition is also known as delayed gastric emptying. It is usually a long-term (chronic) condition. ?There is no cure, but there are  treatments and things that you can do at home to help relieve symptoms. Treating the underlying condition that causes gastroparesis can also help relieve symptoms ? ?Go to the ER if you have any yellowing of the skin/eyes, dark urine, severe AB pain, fever, chills, nausea or vomiting.  ? ?You have been scheduled for an endoscopy. Please follow written instructions given to you at your visit today. ?If you use inhalers (even only as needed), please bring them with you on the day of your procedure.  ? ?Due to recent changes in healthcare laws, you may see the results of your imaging and laboratory studies on MyChart before your provider has had a chance to review them.  We understand that in some cases there may be results that are confusing or concerning to you. Not all laboratory results come back in the same time frame and the provider may be waiting for multiple results in order to interpret others.  Please give [place] 48 hours in order for your provider to thoroughly review all the results before contacting the office for clarification of your results.   ? ?Thank you for choosing [facility] ? ?Amanda Collier,PA-C  ? ?

## 2021-06-01 NOTE — Progress Notes (Signed)
Assessment and plan reviewed.  Difficult case. ?

## 2021-06-05 ENCOUNTER — Inpatient Hospital Stay: Payer: BC Managed Care – PPO | Admitting: Oncology

## 2021-06-05 ENCOUNTER — Other Ambulatory Visit: Payer: Self-pay

## 2021-06-05 ENCOUNTER — Inpatient Hospital Stay: Payer: BC Managed Care – PPO | Attending: Oncology

## 2021-06-05 ENCOUNTER — Encounter: Payer: Self-pay | Admitting: *Deleted

## 2021-06-05 ENCOUNTER — Other Ambulatory Visit: Payer: Self-pay | Admitting: Nurse Practitioner

## 2021-06-05 ENCOUNTER — Inpatient Hospital Stay: Payer: BC Managed Care – PPO

## 2021-06-05 VITALS — BP 111/63 | HR 108 | Temp 99.2°F | Resp 20

## 2021-06-05 VITALS — BP 120/72 | HR 96 | Temp 98.2°F | Resp 18 | Ht 64.0 in | Wt 152.2 lb

## 2021-06-05 DIAGNOSIS — C189 Malignant neoplasm of colon, unspecified: Secondary | ICD-10-CM | POA: Diagnosis not present

## 2021-06-05 DIAGNOSIS — K219 Gastro-esophageal reflux disease without esophagitis: Secondary | ICD-10-CM | POA: Diagnosis not present

## 2021-06-05 DIAGNOSIS — Z28310 Unvaccinated for covid-19: Secondary | ICD-10-CM

## 2021-06-05 DIAGNOSIS — Z803 Family history of malignant neoplasm of breast: Secondary | ICD-10-CM

## 2021-06-05 DIAGNOSIS — Z801 Family history of malignant neoplasm of trachea, bronchus and lung: Secondary | ICD-10-CM

## 2021-06-05 DIAGNOSIS — Z7984 Long term (current) use of oral hypoglycemic drugs: Secondary | ICD-10-CM

## 2021-06-05 DIAGNOSIS — E876 Hypokalemia: Secondary | ICD-10-CM | POA: Diagnosis not present

## 2021-06-05 DIAGNOSIS — G40909 Epilepsy, unspecified, not intractable, without status epilepticus: Secondary | ICD-10-CM | POA: Diagnosis not present

## 2021-06-05 DIAGNOSIS — Z794 Long term (current) use of insulin: Secondary | ICD-10-CM | POA: Insufficient documentation

## 2021-06-05 DIAGNOSIS — C787 Secondary malignant neoplasm of liver and intrahepatic bile duct: Secondary | ICD-10-CM | POA: Insufficient documentation

## 2021-06-05 DIAGNOSIS — A419 Sepsis, unspecified organism: Secondary | ICD-10-CM | POA: Diagnosis not present

## 2021-06-05 DIAGNOSIS — C182 Malignant neoplasm of ascending colon: Secondary | ICD-10-CM | POA: Insufficient documentation

## 2021-06-05 DIAGNOSIS — Z880 Allergy status to penicillin: Secondary | ICD-10-CM

## 2021-06-05 DIAGNOSIS — G2581 Restless legs syndrome: Secondary | ICD-10-CM | POA: Diagnosis not present

## 2021-06-05 DIAGNOSIS — D696 Thrombocytopenia, unspecified: Secondary | ICD-10-CM | POA: Diagnosis not present

## 2021-06-05 DIAGNOSIS — Z8543 Personal history of malignant neoplasm of ovary: Secondary | ICD-10-CM

## 2021-06-05 DIAGNOSIS — D509 Iron deficiency anemia, unspecified: Secondary | ICD-10-CM | POA: Insufficient documentation

## 2021-06-05 DIAGNOSIS — Z882 Allergy status to sulfonamides status: Secondary | ICD-10-CM

## 2021-06-05 DIAGNOSIS — E119 Type 2 diabetes mellitus without complications: Secondary | ICD-10-CM | POA: Insufficient documentation

## 2021-06-05 DIAGNOSIS — Z91048 Other nonmedicinal substance allergy status: Secondary | ICD-10-CM

## 2021-06-05 DIAGNOSIS — C561 Malignant neoplasm of right ovary: Secondary | ICD-10-CM | POA: Diagnosis not present

## 2021-06-05 DIAGNOSIS — R509 Fever, unspecified: Secondary | ICD-10-CM | POA: Diagnosis not present

## 2021-06-05 DIAGNOSIS — R21 Rash and other nonspecific skin eruption: Secondary | ICD-10-CM | POA: Insufficient documentation

## 2021-06-05 DIAGNOSIS — Z91012 Allergy to eggs: Secondary | ICD-10-CM

## 2021-06-05 DIAGNOSIS — Z79899 Other long term (current) drug therapy: Secondary | ICD-10-CM | POA: Insufficient documentation

## 2021-06-05 DIAGNOSIS — Z8049 Family history of malignant neoplasm of other genital organs: Secondary | ICD-10-CM

## 2021-06-05 DIAGNOSIS — R188 Other ascites: Secondary | ICD-10-CM | POA: Diagnosis present

## 2021-06-05 DIAGNOSIS — K8020 Calculus of gallbladder without cholecystitis without obstruction: Secondary | ICD-10-CM | POA: Diagnosis present

## 2021-06-05 DIAGNOSIS — R162 Hepatomegaly with splenomegaly, not elsewhere classified: Secondary | ICD-10-CM | POA: Insufficient documentation

## 2021-06-05 DIAGNOSIS — R6510 Systemic inflammatory response syndrome (SIRS) of non-infectious origin without acute organ dysfunction: Secondary | ICD-10-CM | POA: Diagnosis not present

## 2021-06-05 DIAGNOSIS — Z452 Encounter for adjustment and management of vascular access device: Secondary | ICD-10-CM | POA: Insufficient documentation

## 2021-06-05 DIAGNOSIS — D709 Neutropenia, unspecified: Secondary | ICD-10-CM | POA: Diagnosis present

## 2021-06-05 DIAGNOSIS — T451X5A Adverse effect of antineoplastic and immunosuppressive drugs, initial encounter: Secondary | ICD-10-CM | POA: Diagnosis present

## 2021-06-05 DIAGNOSIS — F32A Depression, unspecified: Secondary | ICD-10-CM | POA: Diagnosis not present

## 2021-06-05 DIAGNOSIS — Z888 Allergy status to other drugs, medicaments and biological substances status: Secondary | ICD-10-CM

## 2021-06-05 DIAGNOSIS — F419 Anxiety disorder, unspecified: Secondary | ICD-10-CM | POA: Diagnosis not present

## 2021-06-05 DIAGNOSIS — D6959 Other secondary thrombocytopenia: Secondary | ICD-10-CM | POA: Diagnosis present

## 2021-06-05 DIAGNOSIS — Z800 Family history of malignant neoplasm of digestive organs: Secondary | ICD-10-CM

## 2021-06-05 DIAGNOSIS — Z20822 Contact with and (suspected) exposure to covid-19: Secondary | ICD-10-CM | POA: Insufficient documentation

## 2021-06-05 DIAGNOSIS — D6481 Anemia due to antineoplastic chemotherapy: Secondary | ICD-10-CM | POA: Insufficient documentation

## 2021-06-05 DIAGNOSIS — Z833 Family history of diabetes mellitus: Secondary | ICD-10-CM

## 2021-06-05 DIAGNOSIS — E872 Acidosis, unspecified: Secondary | ICD-10-CM | POA: Diagnosis not present

## 2021-06-05 DIAGNOSIS — Z823 Family history of stroke: Secondary | ICD-10-CM

## 2021-06-05 DIAGNOSIS — Z8249 Family history of ischemic heart disease and other diseases of the circulatory system: Secondary | ICD-10-CM

## 2021-06-05 LAB — CBC WITH DIFFERENTIAL (CANCER CENTER ONLY)
Abs Immature Granulocytes: 0.01 10*3/uL (ref 0.00–0.07)
Basophils Absolute: 0 10*3/uL (ref 0.0–0.1)
Basophils Relative: 0 %
Eosinophils Absolute: 0.1 10*3/uL (ref 0.0–0.5)
Eosinophils Relative: 3 %
HCT: 30.4 % — ABNORMAL LOW (ref 36.0–46.0)
Hemoglobin: 9.1 g/dL — ABNORMAL LOW (ref 12.0–15.0)
Immature Granulocytes: 0 %
Lymphocytes Relative: 17 %
Lymphs Abs: 0.6 10*3/uL — ABNORMAL LOW (ref 0.7–4.0)
MCH: 24.5 pg — ABNORMAL LOW (ref 26.0–34.0)
MCHC: 29.9 g/dL — ABNORMAL LOW (ref 30.0–36.0)
MCV: 81.7 fL (ref 80.0–100.0)
Monocytes Absolute: 0.3 10*3/uL (ref 0.1–1.0)
Monocytes Relative: 8 %
Neutro Abs: 2.4 10*3/uL (ref 1.7–7.7)
Neutrophils Relative %: 72 %
Platelet Count: 202 10*3/uL (ref 150–400)
RBC: 3.72 MIL/uL — ABNORMAL LOW (ref 3.87–5.11)
RDW: 19.8 % — ABNORMAL HIGH (ref 11.5–15.5)
WBC Count: 3.4 10*3/uL — ABNORMAL LOW (ref 4.0–10.5)
nRBC: 0 % (ref 0.0–0.2)

## 2021-06-05 LAB — CMP (CANCER CENTER ONLY)
ALT: 9 U/L (ref 0–44)
AST: 17 U/L (ref 15–41)
Albumin: 3 g/dL — ABNORMAL LOW (ref 3.5–5.0)
Alkaline Phosphatase: 141 U/L — ABNORMAL HIGH (ref 38–126)
Anion gap: 9 (ref 5–15)
BUN: 9 mg/dL (ref 6–20)
CO2: 25 mmol/L (ref 22–32)
Calcium: 9.9 mg/dL (ref 8.9–10.3)
Chloride: 102 mmol/L (ref 98–111)
Creatinine: 0.45 mg/dL (ref 0.44–1.00)
GFR, Estimated: >60 mL/min (ref >60)
Glucose, Bld: 160 mg/dL — ABNORMAL HIGH (ref 70–99)
Potassium: 3.7 mmol/L (ref 3.5–5.1)
Sodium: 136 mmol/L (ref 135–145)
Total Bilirubin: 1 mg/dL (ref 0.3–1.2)
Total Protein: 7.3 g/dL (ref 6.5–8.1)

## 2021-06-05 LAB — MAGNESIUM: Magnesium: 1.8 mg/dL (ref 1.7–2.4)

## 2021-06-05 LAB — CEA (ACCESS): CEA (CHCC): 39.62 ng/mL — ABNORMAL HIGH (ref 0.00–5.00)

## 2021-06-05 MED ORDER — DIPHENHYDRAMINE HCL 50 MG/ML IJ SOLN
50.0000 mg | Freq: Once | INTRAMUSCULAR | Status: AC | PRN
  Administered 2021-06-05: 25 mg via INTRAVENOUS

## 2021-06-05 MED ORDER — FAMOTIDINE IN NACL 20-0.9 MG/50ML-% IV SOLN
20.0000 mg | Freq: Once | INTRAVENOUS | Status: AC
  Administered 2021-06-05: 20 mg via INTRAVENOUS
  Filled 2021-06-05: qty 50

## 2021-06-05 MED ORDER — ACETAMINOPHEN 325 MG PO TABS
650.0000 mg | ORAL_TABLET | Freq: Once | ORAL | Status: AC
  Administered 2021-06-05: 650 mg via ORAL
  Filled 2021-06-05: qty 2

## 2021-06-05 MED ORDER — SODIUM CHLORIDE 0.9 % IV SOLN
2400.0000 mg/m2 | INTRAVENOUS | Status: DC
  Filled 2021-06-05: qty 85

## 2021-06-05 MED ORDER — SODIUM CHLORIDE 0.9 % IV SOLN
10.0000 mg | Freq: Once | INTRAVENOUS | Status: AC
  Administered 2021-06-05: 10 mg via INTRAVENOUS
  Filled 2021-06-05: qty 10

## 2021-06-05 MED ORDER — OXALIPLATIN CHEMO INJECTION 100 MG/20ML
50.0000 mg/m2 | Freq: Once | INTRAVENOUS | Status: AC
  Administered 2021-06-05: 90 mg via INTRAVENOUS
  Filled 2021-06-05: qty 18

## 2021-06-05 MED ORDER — HEPARIN SOD (PORK) LOCK FLUSH 100 UNIT/ML IV SOLN
500.0000 [IU] | Freq: Once | INTRAVENOUS | Status: AC | PRN
  Administered 2021-06-05: 500 [IU]

## 2021-06-05 MED ORDER — PALONOSETRON HCL INJECTION 0.25 MG/5ML
0.2500 mg | Freq: Once | INTRAVENOUS | Status: AC
  Administered 2021-06-05: 0.25 mg via INTRAVENOUS
  Filled 2021-06-05: qty 5

## 2021-06-05 MED ORDER — DEXTROSE 5 % IV SOLN: Freq: Once | INTRAVENOUS | Status: AC

## 2021-06-05 MED ORDER — METHYLPREDNISOLONE SODIUM SUCC 125 MG IJ SOLR
125.0000 mg | Freq: Once | INTRAMUSCULAR | Status: AC | PRN
  Administered 2021-06-05: 125 mg via INTRAVENOUS

## 2021-06-05 MED ORDER — FLUOROURACIL CHEMO INJECTION 2.5 GM/50ML
400.0000 mg/m2 | Freq: Once | INTRAVENOUS | Status: DC
  Filled 2021-06-05: qty 14

## 2021-06-05 MED ORDER — SODIUM CHLORIDE 0.9% FLUSH
10.0000 mL | INTRAVENOUS | Status: DC | PRN
  Administered 2021-06-05: 10 mL

## 2021-06-05 MED ORDER — LEUCOVORIN CALCIUM INJECTION 350 MG
400.0000 mg/m2 | Freq: Once | INTRAVENOUS | Status: AC
  Administered 2021-06-05: 708 mg via INTRAVENOUS
  Filled 2021-06-05: qty 25

## 2021-06-05 NOTE — Progress Notes (Signed)
?[facility] ?OFFICE PROGRESS NOTE ? ? ?Diagnosis: Colon cancer ? ?INTERVAL HISTORY:  ? ?[name] returns as scheduled.  She underwent a paracentesis for 2.7 L on 05/30/2021.  She continues to have right upper abdominal pain.  She feels the pain is likely related to her "gallbladder ".  The pain is worse after eating.  She has cold sensitivity, but no other neuropathy symptoms. ?She feels the furosemide and spironolactone have helped edema.  Leg edema is significantly improved. ?Objective: ? ?Vital signs in last 24 hours: ? ?Blood pressure 120/72, pulse 96, temperature 98.2 ?F (36.8 ?C), temperature source Oral, resp. rate 18, height 5' 4"  (1.626 m), weight 152 lb 3.2 oz (69 kg), SpO2 100 %. ?  ? ?HEENT: Geographic tongue, no thrush or ulcers ?Resp: Lungs clear bilaterally ?Cardio: Regular rate and rhythm ?GI: Mildly distended, diffuse firmness over the abdominal wall, mild tenderness in the right flank area ?Vascular: Trace lower pretibial edema bilaterally ?  ? ?Portacath/PICC-without erythema ? ?Lab Results: ? ?Lab Results  ?Component Value Date  ? WBC 3.4 (L) 06/05/2021  ? HGB 9.1 (L) 06/05/2021  ? HCT 30.4 (L) 06/05/2021  ? MCV 81.7 06/05/2021  ? PLT 202 06/05/2021  ? NEUTROABS 2.4 06/05/2021  ? ? ?CMP  ?Lab Results  ?Component Value Date  ? NA 136 06/05/2021  ? K 3.7 06/05/2021  ? CL 102 06/05/2021  ? CO2 25 06/05/2021  ? GLUCOSE 160 (H) 06/05/2021  ? BUN 9 06/05/2021  ? CREATININE 0.45 06/05/2021  ? CALCIUM 9.9 06/05/2021  ? PROT 7.3 06/05/2021  ? ALBUMIN 3.0 (L) 06/05/2021  ? AST 17 06/05/2021  ? ALT 9 06/05/2021  ? ALKPHOS 141 (H) 06/05/2021  ? BILITOT 1.0 06/05/2021  ? GFRNONAA >60 06/05/2021  ? GFRAA >60 11/05/2019  ? ? ?Lab Results  ?Component Value Date  ? CEA1 43.43 (H) 06/30/2020  ? CEA 39.62 (H) 06/05/2021  ? CA125 14 11/26/2014  ? ? ?Medications: I have reviewed the patient's current medications. ? ? 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 ?The tumor returned microsatellite stable with no loss of mismatch repair protein expression   ?APC mutated. No BRAF, KRAS, or NRAS mutation On [facility] 1 testing   ?Cycle 1 adjuvant FOLFOX 09/08/2013   ?Cycle 2 adjuvant FOLFOX 09/24/2013   ?Cycle 3 adjuvant FOLFOX 10/08/2013.   ?Cycle 4 adjuvant FOLFOX 10/22/2013.   ?Cycle 5 adjuvant FOLFOX 11/05/2013. Oxaliplatin held due to thrombocytopenia. ?Cycle 6 FOLFOX 11/19/2013. ?Cycle 7 FOLFOX 12/03/2013. Oxaliplatin held secondary to thrombocytopenia. ?Cycle 8 FOLFOX 12/17/2013. ?Cycle 9 FOLFOX 01/04/2014. Oxaliplatin held secondary to neutropenia.   ?Cycle 10 FOLFOX 01/21/2014. Oxaliplatin held secondary to thrombocytopenia. ?Cycle 11 FOLFOX 02/04/2014 ?Cycle 12 FOLFOX 02/18/2014, oxaliplatin dose reduced secondary tothrombocytopenia ?CT abdomen/pelvis 01/30/2014 revealed splenomegaly and no evidence of recurrent colon cancer ?CT chest 04/07/2014 with a stable right lower lobe nodule and no evidence for metastatic disease, no nodules seen on the CT 11/26/2014 ?Markedly elevated CEA 11/24/2014 ?CT 11/26/2014 revealed a right pelvic mass, splenomegaly, small volume ascites ?Right salpingo-oophorectomy 12/28/2014 with the pathology confirming metastatic colon cancer ?CTs 03/23/2016-no evidence of recurrent or metastatic disease. ?CT 11/26/2016-enlargement of a fluid density structure the right pelvic sidewall, no other evidence of metastatic disease ?CT aspiration right pelvic cyst 12/19/2016.  Cytology-BENIGN REACTIVE/REPARATIVE CHANGES. ?CTs 06/05/2017- no evidence of metastatic disease, mild cirrhotic changes with splenomegaly ?CTs 12/11/2017- recurrent cystic right adnexal mass, similar to  on the CT 11/26/2016, stable mild splenomegaly ?CTs 04/23/2018- enlargement of cystic right adnexal mass with mural nodularity, no other evidence of metastatic disease ?Cytoreductive surgery/HIPEC with  mitomycin by Dr. Clovis Riley at [facility] 08/12/2018-R1 resection achieved.  Cytoreduction included omentectomy, LAR, right salpingo-oophorectomy and left colonic gutter/pelvic stripping.  Pathology on the rectum showed recurrent/metastatic adenocarcinoma, tumor 2.0 cm, predominantly involving the subserosa and muscularis propria of the colon, proximal and distal margins of resection were negative, vascular invasion present, metastatic carcinoma present in 1 out of 5 lymph nodes; omentum resection with no malignancy seen, no metastatic carcinoma identified in 1 lymph node examined; left gutter stripping positive metastatic adenocarcinoma; right ovary resection positive metastatic adenocarcinoma. 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 ?CEA 68 02/03/2019 ?Biopsy liver lesion 02/11/2019-metastatic adenocarcinoma consistent with primary colonic adenocarcinoma ?CTs 02/27/2019-multiple liver lesions increased in size, new lesion in the lateral right lobe of the liver.  No evidence of metastatic disease in the chest.  Redemonstrated moderate left hydronephrosis and proximal hydroureter without discrete lesion or obstructing etiology at the transition point of the mid ureter. ?Cycle 1 FOLFIRI/Panitumumab 03/12/2019 ?Cycle 2 FOLFIRI/Panitumumab 03/26/2019-bolus 5-FU and irinotecan held secondary to neutropenia ?Cycle 3 FOLFIRI/Panitumumab 04/09/2019, Udenyca added-not given secondary to seizure/discontinuation of the 5-FU pump ?[facility] abdomen[facility]pelvis [facility] 05/04/2019-no residual fluid collection at the left abdominal wall abscess, stable moderate left hydronephrosis, multifocal indeterminate liver lesions--liver lesions significantly improved ?Cycle 4 FOLFIRI/Panitumumab 05/07/2019, Udenyca ?Cycle 5 FOLFIRI/Panitumumab 05/20/2019, Udenyca ?Cycle 6 FOLFIRI/Panitumumab 06/18/2019, Udenyca ?Cycle 7 FOLFIRI/Panitumumab 07/01/2019 (Irinotecan dose reduced due to  thrombocytopenia), Udenyca--Udenyca was not given ?Cycle 8 FOLFIRI/Panitumumab 07/15/2019, Udenyca ?Cycle 9 FOLFIRI/Panitumumab 07/29/2019, Udenyca ?Cycle 10 FOLFIRI/Panitumumab 08/13/2019, Udenyca ?CTs at [facility] 08/19/2019-decreased size of the hypodense and hyperdense lesions within segment 2 of the left hepatic lobe.  No new lesions identified.  Similar presacral soft tissue thickening with adjacent stable alignment in the rectum.  Improved but persistent left UPJ obstruction with mild left hydronephrosis.  Persistent but decreased size of the wound within the left mid abdomen abdominal wall. ?Cycle 11 irinotecan/Panitumumab 08/27/2019, Udenyca ?Cycle 12 irinotecan/Panitumumab 10/08/2019, Udenyca ?Cycle 13 irinotecan/Panitumumab 11/05/2019, Udenyca ?CT abdomen/pelvis 11/19/2019-no new or progressive interval findings.  Stable appearance of the calcified lesion in the anterior left hepatic dome with second tiny low-density lesion in the dome of the lateral segment left liver.  Both lesions are markedly decreased since 02/27/2019.  Stable mild fullness left intrarenal collecting system and renal pelvis.  Similar appearance of abnormal soft tissue in the left paramidline subcutaneous fat with associated skin thickening, this likely correlates to the fistula. ?Cycle 14 irinotecan/Panitumumab 11/19/2019, Udenyca ?Cycle 15 irinotecan/Panitumumab 12/02/2019, Udenyca ?[facility] abdomen[facility]pelvis [facility] 02/11/2020- similar to slight decrease in segment 2 hypodense and hyperdense lesions (compared to a CT from July 2020), no new lesions, splenomegaly, no fluid collection or abscess, mild stranding adjacent to the incision with overlying skin thickening ?Cycle 16 irinotecan/Panitumumab 03/03/2020, Udenyca ?Cycle 17 irinotecan/Panitumumab 03/17/2020, Udenyca ?Cycle 18 irinotecan/panitumumab 04/07/2020, Udenyca ?Cycle 19 irinotecan/Panitumumab 04/28/2020, Udenyca ?Cycle 20 irinotecan/panitumumab 05/26/2020, Udenyca ?Cycle 21  irinotecan/panitumumab 06/30/2020, Udenyca ?CT abdomen/pelvis 07/19/2020-mildly increased hepatic steatosis.  Increased size of 2.8 cm hypervascular lesion segment 2 left hepatic lobe. ?Cycle 22 irinotecan/Panitumumab 07/21/2020, Lovett Calender

## 2021-06-05 NOTE — Progress Notes (Signed)
Patient presents for treatment. RN assessment completed along with the following: ? ?Labs/vitals reviewed - Yes, and within treatment parameters.   ?Weight within 10% of previous measurement - Yes ?Informed consent completed and reflects current therapy/intent - Yes, on date 04/10/21             ?Provider progress note reviewed - Today's provider note is not yet available. I reviewed the most recent oncology provider progress note in chart dated 05/22/21. ?Treatment/Antibody/Supportive plan reviewed - Yes, and per note by Merceda Elks, RN pepcid to be added to premeds and chemo dose will be adjusted for today's weight.  ?S&H and other orders reviewed - Yes, and there are no additional orders identified. ?Previous treatment date reviewed - Yes, and the appropriate amount of time has elapsed between treatments. ?Clinic Hand Off Received from - Merceda Elks, RN ? ?Patient to proceed with treatment.   ?

## 2021-06-05 NOTE — Progress Notes (Signed)
Patient seen by Dr. Benay Spice today ? ?Vitals are within treatment parameters. ? ?Labs reviewed by Dr. Benay Spice and are within treatment parameters. ? ?Per physician team, patient is ready for treatment. Please note that modifications are being made to the treatment plan including MD has added Pepcid to premeds and adjusted chemo doses based on weight today.   ?

## 2021-06-05 NOTE — Progress Notes (Signed)
Hypersensitivity Reaction note ? ?Date of event: 06/05/21 ?Time of event: 1240 ?Generic name of drug involved: oxaliplatin, leucovorin ?Name of provider notified of the hypersensitivity reaction: Dr. Betsy Coder, Ned Card, NP ?Was agent that likely caused hypersensitivity reaction added to Allergies List within EMR? yes ?Chain of events including reaction signs/symptoms, treatment administered, and outcome (e.g., drug resumed; drug discontinued; sent to Emergency Department; etc.)  ?Patient had received approximately 3/4 of her total oxaliplatin and leucovorin infusion. Patient started to complain of being very cold and had severe chills.  Oxaliplatin and leucovorin were stopped. VS were checked and Dr. Benay Spice and Ned Card, NP made aware.  Patient was given more warm blankets.  Ned Card, NP came to infusion room and verbal orders were received to give additional premeds (See MAR). Patient was then monitored for about 20 minutes until Dr. Benay Spice came to infusion room to check on patient.  Verbal orders were received to discontinue oxaliplatin and leucovorin for the day.  Patient stated she did not want to receive the fluorouracil push and pump today because of how she was feeling.  Dr. Learta Codding made aware. Per Dr. Benay Spice, ok to hold fluorouracil push and pump, and patient will not need to come in for udenyca injection on 06/07/21.  Patient informed of Dr. Gearldine Shown response and verbalized understanding. During observation period, patient developed fever.  Dr. Benay Spice and Ned Card, NP made aware.  See VS flowsheet and MAR.  Patient monitored for 30 minutes post Tylenol administration. Per Dr. Benay Spice, ok for patient to be sent home.  Patient instructed to take motrin or tylenol if she develops a fever at home.  Patient agreed to monitor her temperature and will call us if she has any questions or concerns.  ? ?Teodoro Spray, RN ?06/05/2021 3:21 PM ? ?

## 2021-06-05 NOTE — Patient Instructions (Addendum)
[facility]  Discharge Instructions: ?Thank you for choosing [facility] to provide your oncology and hematology care.  ? ?If you have a lab appointment with the [facility], please go directly to the [facility] and check in at the registration area. ?  ?Wear comfortable clothing and clothing appropriate for easy access to any Portacath or PICC line.  ? ?We strive to give you quality time with your provider. You may need to reschedule your appointment if you arrive late (15 or more minutes).  Arriving late affects you and other patients whose appointments are after yours.  Also, if you miss three or more appointments without notifying the office, you may be dismissed from the clinic at the provider?s discretion.    ?  ?For prescription refill requests, have your pharmacy contact our office and allow 72 hours for refills to be completed.   ? ?Today you received the following chemotherapy and/or immunotherapy agents: oxaliplatin, leucovorin, fluorouracil.     ?  ?To help prevent nausea and vomiting after your treatment, we encourage you to take your nausea medication as directed. ? ?BELOW ARE SYMPTOMS THAT SHOULD BE REPORTED IMMEDIATELY: ?*FEVER GREATER THAN 100.4 F (38 ?C) OR HIGHER ?*CHILLS OR SWEATING ?*NAUSEA AND VOMITING THAT IS NOT CONTROLLED WITH YOUR NAUSEA MEDICATION ?*UNUSUAL SHORTNESS OF BREATH ?*UNUSUAL BRUISING OR BLEEDING ?*URINARY PROBLEMS (pain or burning when urinating, or frequent urination) ?*BOWEL PROBLEMS (unusual diarrhea, constipation, pain near the anus) ?TENDERNESS IN MOUTH AND THROAT WITH OR WITHOUT PRESENCE OF ULCERS (sore throat, sores in mouth, or a toothache) ?UNUSUAL RASH, SWELLING OR PAIN  ?UNUSUAL VAGINAL DISCHARGE OR ITCHING  ? ?Items with * indicate a potential emergency and should be followed up as soon as possible or go to the Emergency Department if any problems should occur. ? ?Please show the CHEMOTHERAPY ALERT CARD or IMMUNOTHERAPY  ALERT CARD at check-in to the Emergency Department and triage nurse. ? ?Should you have questions after your visit or need to cancel or reschedule your appointment, please contact [facility]  Dept: [phone]  and follow the prompts.  Office hours are 8:00 a.m. to 4:30 p.m. Monday - Friday. Please note that voicemails left after 4:00 p.m. may not be returned until the following business day.  We are closed weekends and major holidays. You have access to a nurse at all times for urgent questions. Please call the main number to the clinic Dept: [phone] and follow the prompts. ? ? ?For any non-urgent questions, you may also contact your provider using MyChart. We now offer e-Visits for anyone 61 and older to request care online for non-urgent symptoms. For details visit [email or web address]. ?  ?Also download the MyChart app! Go to the app store, search "MyChart", open the app, select [facility], and log in with your MyChart username and password. ? ?Due to Covid, a mask is required upon entering the hospital/clinic. If you do not have a mask, one will be given to you upon arrival. For doctor visits, patients may have 1 support person aged 61 or older with them. For treatment visits, patients cannot have anyone with them due to current Covid guidelines and our immunocompromised population.  ? ?Oxaliplatin Injection ?What is this medication? ?OXALIPLATIN (ox AL i PLA tin) is a chemotherapy drug. It targets fast dividing cells, like cancer cells, and causes these cells to die. This medicine is used to treat cancers of the colon and rectum, and many other cancers. ?  This medicine may be used for other purposes; ask your health care provider or pharmacist if you have questions. ?COMMON BRAND NAME(S): Eloxatin ?What should I tell my care team before I take this medication? ?They need to know if you have any of these conditions: ?heart disease ?history of irregular heartbeat ?liver  disease ?low blood counts, like white cells, platelets, or red blood cells ?lung or breathing disease, like asthma ?take medicines that treat or prevent blood clots ?tingling of the fingers or toes, or other nerve disorder ?an unusual or allergic reaction to oxaliplatin, other chemotherapy, other medicines, foods, dyes, or preservatives ?pregnant or trying to get pregnant ?breast-feeding ?How should I use this medication? ?This drug is given as an infusion into a vein. It is administered in a hospital or clinic by a specially trained health care professional. ?Talk to your pediatrician regarding the use of this medicine in children. Special care may be needed. ?Overdosage: If you think you have taken too much of this medicine contact a poison control center or emergency room at once. ?NOTE: This medicine is only for you. Do not share this medicine with others. ?What if I miss a dose? ?It is important not to miss a dose. Call your doctor or health care professional if you are unable to keep an appointment. ?What may interact with this medication? ?Do not take this medicine with any of the following medications: ?cisapride ?dronedarone ?pimozide ?thioridazine ?This medicine may also interact with the following medications: ?aspirin and aspirin-like medicines ?certain medicines that treat or prevent blood clots like warfarin, apixaban, dabigatran, and rivaroxaban ?cisplatin ?cyclosporine ?diuretics ?medicines for infection like acyclovir, adefovir, amphotericin B, bacitracin, cidofovir, foscarnet, ganciclovir, gentamicin, pentamidine, vancomycin ?NSAIDs, medicines for pain and inflammation, like ibuprofen or naproxen ?other medicines that prolong the QT interval (an abnormal heart rhythm) ?pamidronate ?zoledronic acid ?This list may not describe all possible interactions. Give your health care provider a list of all the medicines, herbs, non-prescription drugs, or dietary supplements you use. Also tell them if you  smoke, drink alcohol, or use illegal drugs. Some items may interact with your medicine. ?What should I watch for while using this medication? ?Your condition will be monitored carefully while you are receiving this medicine. ?You may need blood work done while you are taking this medicine. ?This medicine may make you feel generally unwell. This is not uncommon as chemotherapy can affect healthy cells as well as cancer cells. Report any side effects. Continue your course of treatment even though you feel ill unless your healthcare professional tells you to stop. ?This medicine can make you more sensitive to cold. Do not drink cold drinks or use ice. Cover exposed skin before coming in contact with cold temperatures or cold objects. When out in cold weather wear warm clothing and cover your mouth and nose to warm the air that goes into your lungs. Tell your doctor if you get sensitive to the cold. ?Do not become pregnant while taking this medicine or for 9 months after stopping it. Women should inform their health care professional if they wish to become pregnant or think they might be pregnant. Men should not father a child while taking this medicine and for 6 months after stopping it. There is potential for serious side effects to an unborn child. Talk to your health care professional for more information. ?Do not breast-feed a child while taking this medicine or for 3 months after stopping it. ?This medicine has caused ovarian failure in some women. This  medicine may make it more difficult to get pregnant. Talk to your health care professional if you are concerned about your fertility. ?This medicine has caused decreased sperm counts in some men. This may make it more difficult to father a child. Talk to your health care professional if you are concerned about your fertility. ?This medicine may increase your risk of getting an infection. Call your health care professional for advice if you get a fever, chills, or  sore throat, or other symptoms of a cold or flu. Do not treat yourself. Try to avoid being around people who are sick. ?Avoid taking medicines that contain aspirin, acetaminophen, ibuprofen, naproxen, or ketop

## 2021-06-05 NOTE — Patient Instructions (Signed)

## 2021-06-06 ENCOUNTER — Emergency Department (HOSPITAL_COMMUNITY): Payer: BC Managed Care – PPO

## 2021-06-06 ENCOUNTER — Encounter (HOSPITAL_COMMUNITY): Payer: Self-pay | Admitting: Emergency Medicine

## 2021-06-06 ENCOUNTER — Other Ambulatory Visit: Payer: Self-pay

## 2021-06-06 ENCOUNTER — Observation Stay (HOSPITAL_COMMUNITY)
Admission: EM | Admit: 2021-06-06 | Discharge: 2021-06-08 | Disposition: A | Discharge status: Home / Self Care | Payer: BC Managed Care – PPO | Attending: Internal Medicine | Admitting: Internal Medicine

## 2021-06-06 DIAGNOSIS — E119 Type 2 diabetes mellitus without complications: Secondary | ICD-10-CM

## 2021-06-06 DIAGNOSIS — T451X5A Adverse effect of antineoplastic and immunosuppressive drugs, initial encounter: Secondary | ICD-10-CM | POA: Diagnosis present

## 2021-06-06 DIAGNOSIS — D6959 Other secondary thrombocytopenia: Secondary | ICD-10-CM | POA: Diagnosis present

## 2021-06-06 DIAGNOSIS — K8020 Calculus of gallbladder without cholecystitis without obstruction: Secondary | ICD-10-CM | POA: Diagnosis present

## 2021-06-06 DIAGNOSIS — E1165 Type 2 diabetes mellitus with hyperglycemia: Secondary | ICD-10-CM

## 2021-06-06 DIAGNOSIS — E872 Acidosis, unspecified: Secondary | ICD-10-CM | POA: Diagnosis not present

## 2021-06-06 DIAGNOSIS — Z880 Allergy status to penicillin: Secondary | ICD-10-CM | POA: Diagnosis not present

## 2021-06-06 DIAGNOSIS — Z91012 Allergy to eggs: Secondary | ICD-10-CM | POA: Diagnosis not present

## 2021-06-06 DIAGNOSIS — F32A Depression, unspecified: Secondary | ICD-10-CM | POA: Diagnosis present

## 2021-06-06 DIAGNOSIS — Z28310 Unvaccinated for covid-19: Secondary | ICD-10-CM | POA: Diagnosis not present

## 2021-06-06 DIAGNOSIS — C561 Malignant neoplasm of right ovary: Secondary | ICD-10-CM | POA: Diagnosis not present

## 2021-06-06 DIAGNOSIS — E876 Hypokalemia: Secondary | ICD-10-CM | POA: Diagnosis present

## 2021-06-06 DIAGNOSIS — D709 Neutropenia, unspecified: Secondary | ICD-10-CM | POA: Diagnosis present

## 2021-06-06 DIAGNOSIS — R509 Fever, unspecified: Secondary | ICD-10-CM | POA: Diagnosis not present

## 2021-06-06 DIAGNOSIS — D509 Iron deficiency anemia, unspecified: Secondary | ICD-10-CM

## 2021-06-06 DIAGNOSIS — D696 Thrombocytopenia, unspecified: Secondary | ICD-10-CM | POA: Diagnosis not present

## 2021-06-06 DIAGNOSIS — R6510 Systemic inflammatory response syndrome (SIRS) of non-infectious origin without acute organ dysfunction: Secondary | ICD-10-CM | POA: Diagnosis present

## 2021-06-06 DIAGNOSIS — K219 Gastro-esophageal reflux disease without esophagitis: Secondary | ICD-10-CM | POA: Diagnosis not present

## 2021-06-06 DIAGNOSIS — Z794 Long term (current) use of insulin: Secondary | ICD-10-CM | POA: Diagnosis not present

## 2021-06-06 DIAGNOSIS — G40909 Epilepsy, unspecified, not intractable, without status epilepticus: Secondary | ICD-10-CM | POA: Diagnosis not present

## 2021-06-06 DIAGNOSIS — Z8543 Personal history of malignant neoplasm of ovary: Secondary | ICD-10-CM | POA: Diagnosis not present

## 2021-06-06 DIAGNOSIS — C787 Secondary malignant neoplasm of liver and intrahepatic bile duct: Secondary | ICD-10-CM | POA: Diagnosis not present

## 2021-06-06 DIAGNOSIS — F419 Anxiety disorder, unspecified: Secondary | ICD-10-CM | POA: Diagnosis present

## 2021-06-06 DIAGNOSIS — D6481 Anemia due to antineoplastic chemotherapy: Secondary | ICD-10-CM | POA: Diagnosis not present

## 2021-06-06 DIAGNOSIS — Z20822 Contact with and (suspected) exposure to covid-19: Secondary | ICD-10-CM | POA: Diagnosis not present

## 2021-06-06 DIAGNOSIS — C189 Malignant neoplasm of colon, unspecified: Secondary | ICD-10-CM | POA: Diagnosis not present

## 2021-06-06 DIAGNOSIS — A419 Sepsis, unspecified organism: Secondary | ICD-10-CM

## 2021-06-06 DIAGNOSIS — Z79899 Other long term (current) drug therapy: Secondary | ICD-10-CM | POA: Diagnosis not present

## 2021-06-06 DIAGNOSIS — Z882 Allergy status to sulfonamides status: Secondary | ICD-10-CM | POA: Diagnosis not present

## 2021-06-06 DIAGNOSIS — R188 Other ascites: Secondary | ICD-10-CM | POA: Diagnosis present

## 2021-06-06 DIAGNOSIS — R569 Unspecified convulsions: Secondary | ICD-10-CM

## 2021-06-06 DIAGNOSIS — G2581 Restless legs syndrome: Secondary | ICD-10-CM | POA: Diagnosis present

## 2021-06-06 LAB — COMPREHENSIVE METABOLIC PANEL
ALT: 15 U/L (ref 0–44)
AST: 32 U/L (ref 15–41)
Albumin: 2.7 g/dL — ABNORMAL LOW (ref 3.5–5.0)
Alkaline Phosphatase: 139 U/L — ABNORMAL HIGH (ref 38–126)
Anion gap: 10 (ref 5–15)
BUN: 12 mg/dL (ref 6–20)
CO2: 21 mmol/L — ABNORMAL LOW (ref 22–32)
Calcium: 8.8 mg/dL — ABNORMAL LOW (ref 8.9–10.3)
Chloride: 101 mmol/L (ref 98–111)
Creatinine, Ser: 0.73 mg/dL (ref 0.44–1.00)
GFR, Estimated: >60 mL/min (ref >60)
Glucose, Bld: 370 mg/dL — ABNORMAL HIGH (ref 70–99)
Potassium: 4.1 mmol/L (ref 3.5–5.1)
Sodium: 132 mmol/L — ABNORMAL LOW (ref 135–145)
Total Bilirubin: 1.5 mg/dL — ABNORMAL HIGH (ref 0.3–1.2)
Total Protein: 7.7 g/dL (ref 6.5–8.1)

## 2021-06-06 LAB — CBC WITH DIFFERENTIAL/PLATELET
Abs Immature Granulocytes: 0.01 10*3/uL (ref 0.00–0.07)
Abs Immature Granulocytes: 0.01 10*3/uL (ref 0.00–0.07)
Basophils Absolute: 0 10*3/uL (ref 0.0–0.1)
Basophils Absolute: 0 10*3/uL (ref 0.0–0.1)
Basophils Relative: 0 %
Basophils Relative: 0 %
Eosinophils Absolute: 0 10*3/uL (ref 0.0–0.5)
Eosinophils Absolute: 0.1 10*3/uL (ref 0.0–0.5)
Eosinophils Relative: 0 %
Eosinophils Relative: 1 %
HCT: 28.6 % — ABNORMAL LOW (ref 36.0–46.0)
HCT: 29.3 % — ABNORMAL LOW (ref 36.0–46.0)
Hemoglobin: 8.7 g/dL — ABNORMAL LOW (ref 12.0–15.0)
Hemoglobin: 8.7 g/dL — ABNORMAL LOW (ref 12.0–15.0)
Immature Granulocytes: 0 %
Immature Granulocytes: 0 %
Lymphocytes Relative: 2 %
Lymphocytes Relative: 3 %
Lymphs Abs: 0.1 10*3/uL — ABNORMAL LOW (ref 0.7–4.0)
Lymphs Abs: 0.1 10*3/uL — ABNORMAL LOW (ref 0.7–4.0)
MCH: 25.3 pg — ABNORMAL LOW (ref 26.0–34.0)
MCH: 24.9 pg — ABNORMAL LOW (ref 26.0–34.0)
MCHC: 30.4 g/dL (ref 30.0–36.0)
MCHC: 29.7 g/dL — ABNORMAL LOW (ref 30.0–36.0)
MCV: 83.1 fL (ref 80.0–100.0)
MCV: 84 fL (ref 80.0–100.0)
Monocytes Absolute: 0.2 10*3/uL (ref 0.1–1.0)
Monocytes Absolute: 0.3 10*3/uL (ref 0.1–1.0)
Monocytes Relative: 4 %
Monocytes Relative: 6 %
Neutro Abs: 4.2 10*3/uL (ref 1.7–7.7)
Neutro Abs: 3.8 10*3/uL (ref 1.7–7.7)
Neutrophils Relative %: 94 %
Neutrophils Relative %: 90 %
Platelets: 145 10*3/uL — ABNORMAL LOW (ref 150–400)
Platelets: 154 10*3/uL (ref 150–400)
RBC: 3.44 MIL/uL — ABNORMAL LOW (ref 3.87–5.11)
RBC: 3.49 MIL/uL — ABNORMAL LOW (ref 3.87–5.11)
RDW: 19.6 % — ABNORMAL HIGH (ref 11.5–15.5)
RDW: 20.2 % — ABNORMAL HIGH (ref 11.5–15.5)
WBC: 4.5 10*3/uL (ref 4.0–10.5)
WBC: 4.3 10*3/uL (ref 4.0–10.5)
nRBC: 0 % (ref 0.0–0.2)
nRBC: 0 % (ref 0.0–0.2)

## 2021-06-06 LAB — URINALYSIS, ROUTINE W REFLEX MICROSCOPIC
Bilirubin Urine: NEGATIVE
Bilirubin Urine: NEGATIVE
Glucose, UA: >=500 mg/dL — AB
Glucose, UA: 50 mg/dL — AB
Hgb urine dipstick: NEGATIVE
Hgb urine dipstick: NEGATIVE
Ketones, ur: NEGATIVE mg/dL
Ketones, ur: NEGATIVE mg/dL
Leukocytes,Ua: NEGATIVE
Leukocytes,Ua: NEGATIVE
Nitrite: NEGATIVE
Nitrite: NEGATIVE
Protein, ur: 30 mg/dL — AB
Protein, ur: NEGATIVE mg/dL
Specific Gravity, Urine: 1.028 (ref 1.005–1.030)
Specific Gravity, Urine: 1.018 (ref 1.005–1.030)
pH: 7 (ref 5.0–8.0)
pH: 5 (ref 5.0–8.0)

## 2021-06-06 LAB — LACTIC ACID, PLASMA
Lactic Acid, Venous: 4.3 mmol/L (ref 0.5–1.9)
Lactic Acid, Venous: 3.5 mmol/L (ref 0.5–1.9)
Lactic Acid, Venous: 2.8 mmol/L (ref 0.5–1.9)
Lactic Acid, Venous: 2.4 mmol/L (ref 0.5–1.9)
Lactic Acid, Venous: 3.4 mmol/L (ref 0.5–1.9)

## 2021-06-06 LAB — PROTIME-INR
INR: 1.4 — ABNORMAL HIGH (ref 0.8–1.2)
Prothrombin Time: 16.9 seconds — ABNORMAL HIGH (ref 11.4–15.2)

## 2021-06-06 LAB — PROCALCITONIN: Procalcitonin: 7.45 ng/mL

## 2021-06-06 LAB — RESP PANEL BY RT-PCR (FLU A&B, COVID) ARPGX2
Influenza A by PCR: NEGATIVE
Influenza B by PCR: NEGATIVE
SARS Coronavirus 2 by RT PCR: NEGATIVE

## 2021-06-06 LAB — CBG MONITORING, ED: Glucose-Capillary: 249 mg/dL — ABNORMAL HIGH (ref 70–99)

## 2021-06-06 LAB — GLUCOSE, CAPILLARY
Glucose-Capillary: 224 mg/dL — ABNORMAL HIGH (ref 70–99)
Glucose-Capillary: 259 mg/dL — ABNORMAL HIGH (ref 70–99)
Glucose-Capillary: 180 mg/dL — ABNORMAL HIGH (ref 70–99)

## 2021-06-06 LAB — APTT: aPTT: 41 seconds — ABNORMAL HIGH (ref 24–36)

## 2021-06-06 MED ORDER — SODIUM CHLORIDE 0.9 % IV SOLN
2.0000 g | Freq: Once | INTRAVENOUS | Status: AC
  Administered 2021-06-06: 2 g via INTRAVENOUS
  Filled 2021-06-06: qty 12.5

## 2021-06-06 MED ORDER — SODIUM CHLORIDE 0.9 % IV SOLN: INTRAVENOUS | Status: DC

## 2021-06-06 MED ORDER — LORAZEPAM 0.5 MG PO TABS
0.5000 mg | ORAL_TABLET | Freq: Four times a day (QID) | ORAL | Status: DC | PRN
  Administered 2021-06-06 – 2021-06-08 (×4): 0.5 mg via ORAL
  Filled 2021-06-06 (×4): qty 1

## 2021-06-06 MED ORDER — LACTATED RINGERS IV SOLN: INTRAVENOUS | Status: DC

## 2021-06-06 MED ORDER — INSULIN ASPART 100 UNIT/ML IJ SOLN
0.0000 [IU] | Freq: Three times a day (TID) | INTRAMUSCULAR | Status: DC
  Administered 2021-06-06: 5 [IU] via SUBCUTANEOUS
  Administered 2021-06-06: 8 [IU] via SUBCUTANEOUS
  Administered 2021-06-07: 3 [IU] via SUBCUTANEOUS
  Administered 2021-06-07 (×2): 5 [IU] via SUBCUTANEOUS
  Administered 2021-06-08 (×2): 3 [IU] via SUBCUTANEOUS

## 2021-06-06 MED ORDER — OXYCODONE HCL 5 MG PO TABS
5.0000 mg | ORAL_TABLET | ORAL | Status: DC | PRN
  Filled 2021-06-06: qty 1

## 2021-06-06 MED ORDER — INSULIN ASPART 100 UNIT/ML IJ SOLN: 0.0000 [IU] | Freq: Every day | INTRAMUSCULAR | Status: DC

## 2021-06-06 MED ORDER — VANCOMYCIN HCL 1250 MG/250ML IV SOLN
1250.0000 mg | Freq: Once | INTRAVENOUS | Status: AC
  Administered 2021-06-06: 1250 mg via INTRAVENOUS
  Filled 2021-06-06: qty 250

## 2021-06-06 MED ORDER — ONDANSETRON HCL 4 MG/2ML IJ SOLN
4.0000 mg | Freq: Four times a day (QID) | INTRAMUSCULAR | Status: DC | PRN
  Administered 2021-06-06: 4 mg via INTRAVENOUS
  Filled 2021-06-06: qty 2

## 2021-06-06 MED ORDER — PANTOPRAZOLE SODIUM 40 MG PO TBEC
40.0000 mg | DELAYED_RELEASE_TABLET | Freq: Two times a day (BID) | ORAL | Status: DC
Start: 2021-06-06 — End: 2021-06-08
  Administered 2021-06-06 – 2021-06-08 (×5): 40 mg via ORAL
  Filled 2021-06-06 (×5): qty 1

## 2021-06-06 MED ORDER — INSULIN ASPART 100 UNIT/ML IJ SOLN: 6.0000 [IU] | Freq: Once | INTRAMUSCULAR | Status: DC

## 2021-06-06 MED ORDER — VANCOMYCIN HCL 750 MG/150ML IV SOLN
750.0000 mg | Freq: Three times a day (TID) | INTRAVENOUS | Status: DC
  Administered 2021-06-06 – 2021-06-08 (×6): 750 mg via INTRAVENOUS
  Filled 2021-06-06 (×6): qty 150

## 2021-06-06 MED ORDER — LACTATED RINGERS IV BOLUS (SEPSIS)
250.0000 mL | Freq: Once | INTRAVENOUS | Status: AC
  Administered 2021-06-06: 250 mL via INTRAVENOUS

## 2021-06-06 MED ORDER — LACTATED RINGERS IV BOLUS (SEPSIS)
1000.0000 mL | Freq: Once | INTRAVENOUS | Status: AC
  Administered 2021-06-06: 1000 mL via INTRAVENOUS

## 2021-06-06 MED ORDER — SODIUM CHLORIDE 0.9 % IV SOLN
2.0000 g | Freq: Three times a day (TID) | INTRAVENOUS | Status: DC
  Administered 2021-06-06 – 2021-06-08 (×6): 2 g via INTRAVENOUS
  Filled 2021-06-06 (×6): qty 12.5

## 2021-06-06 MED ORDER — LACOSAMIDE 50 MG PO TABS
200.0000 mg | ORAL_TABLET | Freq: Two times a day (BID) | ORAL | Status: DC
  Administered 2021-06-06 – 2021-06-08 (×5): 200 mg via ORAL
  Filled 2021-06-06 (×5): qty 4

## 2021-06-06 MED ORDER — SPIRONOLACTONE 25 MG PO TABS
50.0000 mg | ORAL_TABLET | Freq: Every day | ORAL | Status: DC
  Administered 2021-06-06 – 2021-06-08 (×3): 50 mg via ORAL
  Filled 2021-06-06 (×3): qty 2

## 2021-06-06 MED ORDER — LACTATED RINGERS IV BOLUS
1000.0000 mL | Freq: Once | INTRAVENOUS | Status: AC
  Administered 2021-06-06: 1000 mL via INTRAVENOUS

## 2021-06-06 MED ORDER — VANCOMYCIN HCL IN DEXTROSE 1-5 GM/200ML-% IV SOLN: 1000.0000 mg | Freq: Once | INTRAVENOUS | Status: DC

## 2021-06-06 MED ORDER — ACETAMINOPHEN 650 MG RE SUPP: 650.0000 mg | Freq: Four times a day (QID) | RECTAL | Status: DC | PRN

## 2021-06-06 MED ORDER — HYDROXYZINE HCL 25 MG PO TABS: 25.0000 mg | ORAL_TABLET | Freq: Three times a day (TID) | ORAL | Status: DC | PRN

## 2021-06-06 MED ORDER — ONDANSETRON HCL 4 MG PO TABS: 4.0000 mg | ORAL_TABLET | Freq: Four times a day (QID) | ORAL | Status: DC | PRN

## 2021-06-06 MED ORDER — ACETAMINOPHEN 325 MG PO TABS
650.0000 mg | ORAL_TABLET | Freq: Four times a day (QID) | ORAL | Status: DC | PRN
  Administered 2021-06-06 – 2021-06-08 (×5): 650 mg via ORAL
  Filled 2021-06-06 (×6): qty 2

## 2021-06-06 MED ORDER — METRONIDAZOLE 500 MG/100ML IV SOLN
500.0000 mg | Freq: Once | INTRAVENOUS | Status: AC
  Administered 2021-06-06: 500 mg via INTRAVENOUS
  Filled 2021-06-06: qty 100

## 2021-06-06 MED ORDER — HEPARIN SODIUM (PORCINE) 5000 UNIT/ML IJ SOLN
5000.0000 [IU] | Freq: Three times a day (TID) | INTRAMUSCULAR | Status: DC
  Administered 2021-06-06 – 2021-06-08 (×7): 5000 [IU] via SUBCUTANEOUS
  Filled 2021-06-06 (×7): qty 1

## 2021-06-06 MED ORDER — INSULIN ASPART 100 UNIT/ML IJ SOLN
4.0000 [IU] | Freq: Once | INTRAMUSCULAR | Status: AC
  Administered 2021-06-06: 4 [IU] via SUBCUTANEOUS
  Filled 2021-06-06: qty 1

## 2021-06-06 NOTE — Assessment & Plan Note (Addendum)
Continue Vimpat 200 mg twice daily ?Remained stable ?

## 2021-06-06 NOTE — ED Notes (Signed)
Date and time results received: 06/06/21 1:36 AM ? ?Test: Lactic Acid ?Critical Value: 4.3 ? ?Name of Provider Notified: Dr. Karle Starch ? ?Orders Received? Or Actions Taken?: Acknowledged  ?

## 2021-06-06 NOTE — Assessment & Plan Note (Signed)
Continue Protonix °

## 2021-06-06 NOTE — ED Provider Notes (Signed)
? ?[facility]  ?Provider Note ? ?CSN: [id number] ?Arrival date & time: 06/05/21 2349 ? ?History ?Chief Complaint  ?Patient presents with  ?? Fever  ? ? ?[name] is a 46 y.o. 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 She had a chemo treatment earlier today (cisplatin and leucovorin) during which she developed chills requiring a pause of the chemo and then developed a fever afterwards. She was given antipyretics and monitored with improvement and ultimately discharged after blood cultures were drawn.  She reports at home the fever came back to 103F despite taking APAP and Motrin around 2200hrs. She deneis any vomiting but has had some nausea. No cough, SOB, dysuria. She has had RUQ pain off and on for the last few weeks. She reports she had a similar febrile reaction after her last chemo treatment in March which required a stay in the hospital although her workup then was negative for a source. She has also had some ascites requiring paracentesis and imaging showing gall stones.  ? ? ?Home Medications ?Prior to Admission medications   ?Medication Sig Start Date End Date Taking? Authorizing Provider  ?Accu-Chek Softclix Lancets lancets SMARTSIG:2 Topical Twice Daily 07/22/20   [provider]  ?acetaminophen (TYLENOL) 500 MG tablet Take 1,000 mg by mouth every 6 (six) hours as needed for mild pain.    [provider]  ?blood glucose meter kit and supplies Dispense based on patient and insurance preference. Use up to four times daily as directed. (FOR ICD-10 E10.9, E11.9). 07/22/20   [name], Raymond J, MD  ?diphenhydrAMINE (BENADRYL) 25 MG tablet Take 50 mg by mouth at bedtime as needed for itching or sleep. ?Patient not taking: Reported on 06/05/2021    [provider]  ?docusate sodium (COLACE) 100 MG capsule Take 1 capsule (100 mg total) by mouth every 12 (twelve) hours. 03/27/21   Varney Biles, MD   ?furosemide (LASIX) 20 MG tablet Take 1 tablet (20 mg total) by mouth daily. 05/22/21   Owens Shark, NP  ?hydrOXYzine (ATARAX) 25 MG tablet Take 1 tablet (25 mg total) by mouth at bedtime. 05/12/21   [name], MD  ?insulin NPH Human (NOVOLIN N) 100 UNIT/ML injection Inject 0.1 mLs (10 Units total) into the skin at bedtime. 05/05/21   [name], Raymond J, MD  ?lacosamide (VIMPAT) 200 MG TABS tablet Take 1 tablet by mouth twice daily ?Patient taking differently: Take 200 mg by mouth 2 (two) times daily. 02/17/21   [name], DO  ?loperamide (IMODIUM) 2 MG capsule Take 2 mg by mouth as needed for diarrhea or loose stools.    [provider]  ?LORazepam (ATIVAN) 0.5 MG tablet Take 1 tablet by mouth twice daily as needed for anxiety 04/24/21   [name], MD  ?magic mouthwash SOLN Take 5 mLs by mouth 4 (four) times daily. ?Patient not taking: Reported on 06/05/2021 04/15/21   [name], MD  ?magnesium oxide (MAG-OX) 400 (240 Mg) MG tablet Take 1 tablet (400 mg total) by mouth 2 (two) times daily. 05/12/21   [name], MD  ?metFORMIN (GLUCOPHAGE) 500 MG tablet Take 1 tablet (500 mg total) by mouth 2 (two) times daily with a meal. 03/16/21   [name], [name], MD  ?pantoprazole (PROTONIX) 40 MG tablet Take 1 tablet (40 mg total) by mouth 2 (two) times daily. 05/22/21   Owens Shark, NP  ?  potassium chloride (MICRO-K) 10 MEQ CR capsule Take 1 capsule (10 mEq total) by mouth daily. 05/12/21   [name], MD  ?prochlorperazine (COMPAZINE) 10 MG tablet Take 1 tablet (10 mg total) by mouth every 6 (six) hours as needed for nausea or vomiting. 12/20/20   Owens Shark, NP  ?simethicone (MYLICON) 80 MG chewable tablet Chew 1 tablet (80 mg total) by mouth every 6 (six) hours as needed for flatulence. 04/28/21   Manuella Ghazi, Pratik D, DO  ?spironolactone (ALDACTONE) 50 MG tablet Take 1 tablet (50 mg total) by mouth daily. 05/22/21   Owens Shark, NP  ? ? ? ?Allergies    ?Adhesive [tape],  Clindamycin/lincomycin, Promethazine, Eggs or egg-derived products, Leucovorin calcium, Metoclopramide hcl, Oxaliplatin, Oxycodone-acetaminophen, Sulfa antibiotics, Fluogen [influenza virus vaccine], Levaquin [levofloxacin], and Penicillins ? ? ?Review of Systems   ?Review of Systems ?Please see HPI for pertinent positives and negatives ? ?Physical Exam ?BP 116/73   Pulse 96   Temp 99.8 ?F (37.7 ?C) (Oral)   Resp (!) 24   Ht _0  (1.626 m)   Wt 69 kg   SpO2 98%   BMI 26.12 kg/m?  ? ?Physical Exam ?Vitals and nursing note reviewed.  ?Constitutional:   ?   Appearance: Normal appearance.  ?HENT:  ?   Head: Normocephalic and atraumatic.  ?   Nose: Nose normal.  ?   Mouth/Throat:  ?   Mouth: Mucous membranes are moist.  ?Eyes:  ?   Extraocular Movements: Extraocular movements intact.  ?   Conjunctiva/sclera: Conjunctivae normal.  ?Cardiovascular:  ?   Rate and Rhythm: Tachycardia present.  ?Pulmonary:  ?   Effort: Pulmonary effort is normal.  ?   Breath sounds: Normal breath sounds.  ?Abdominal:  ?   General: Abdomen is flat.  ?   Palpations: Abdomen is soft.  ?   Tenderness: There is abdominal tenderness (mild, RUQ, neg Murphy's). There is no guarding or rebound.  ?Musculoskeletal:     ?   General: No swelling. Normal range of motion.  ?   Cervical back: Neck supple.  ?Skin: ?   General: Skin is warm and dry.  ?   Coloration: Skin is pale.  ?Neurological:  ?   General: No focal deficit present.  ?   Mental Status: She is alert.  ?Psychiatric:     ?   Mood and Affect: Mood normal.  ? ? ?ED Results / Procedures / Treatments   ?EKG ?None ? ?Procedures ?Procedures ? ?Medications Ordered in the ED ?Medications  ?lactated ringers infusion (has no administration in time range)  ?metroNIDAZOLE (FLAGYL) IVPB 500 mg (500 mg Intravenous New Bag/Given 06/06/21 0251)  ?vancomycin (VANCOREADY) IVPB 1250 mg/250 mL (has no administration in time range)  ?lactated ringers bolus 1,000 mL (0 mLs Intravenous Stopped 06/06/21 0120)   ?lactated ringers bolus 1,000 mL (1,000 mLs Intravenous New Bag/Given 06/06/21 0153)  ?  And  ?lactated ringers bolus 1,000 mL (1,000 mLs Intravenous New Bag/Given 06/06/21 0248)  ?  And  ?lactated ringers bolus 250 mL (250 mLs Intravenous New Bag/Given 06/06/21 0256)  ?ceFEPIme (MAXIPIME) 2 g in sodium chloride 0.9 % 100 mL IVPB (0 g Intravenous Stopped 06/06/21 0227)  ? ? ?Initial Impression and Plan ? Patient arrives febrile and tachycardic after getting chemo earlier today. Unclear if this is true infection vs chemo side effects. Will check labs, give IVF and reassess.  ? ?ED Course  ? ?Clinical Course as of 06/06/21 0328  ?Tue  Jun 06, 2021  ?0102 CBC with anemia, not neutropenic.  [CS]  ?0102 I personally viewed the images from radiology studies and agree with radiologist interpretation: CXR is clear.  ? [CS]  ?0117 Coags are mild elevated [CS]  ?0120 CMP with hyperglycemia, mildly elevated bilirubin.  [CS]  ?0138 Lactic acid is elevated. Will add IVF bolus and broad spectrum abx for presumed sepsis without an apparent source.  [CS]  ?0209 UA is clear.  [CS]  ?[place] with Dr. Clearence Ped, Hospitalist, who will evaluate for admission.  [CS]  ?  ?Clinical Course User Index ?[CS] Truddie Hidden, MD  ? ? ? ?MDM Rules/Calculators/A&P ?Medical Decision Making ?Problems Addressed: ?Fever of unknown origin: acute illness or injury ?Sepsis, due to unspecified organism, unspecified whether acute organ dysfunction present [facility]): acute illness or injury ? ?Amount and/or Complexity of Data Reviewed ?Labs: ordered. Decision-making details documented in ED Course. ?Radiology: ordered and independent interpretation performed. Decision-making details documented in ED Course. ?ECG/medicine tests: ordered and independent interpretation performed. Decision-making details documented in ED Course. ? ?Risk ?Prescription drug management. ?Decision regarding hospitalization. ? ? ? ?Final Clinical Impression(s) / ED Diagnoses ?Final  diagnoses:  ?Fever of unknown origin  ?Sepsis, due to unspecified organism, unspecified whether acute organ dysfunction present [facility])  ? ? ?Rx / DC Orders ?ED Discharge Orders   ? ? None  ? ?  ? ?  ?Truddie Hidden, MD ?06/06/21 [phone]

## 2021-06-06 NOTE — Assessment & Plan Note (Addendum)
Consult oncology --appreciate input from Dr. Delton Coombes ?Last chemo May 1 ?

## 2021-06-06 NOTE — Hospital Course (Signed)
?  [name] is a 46 y.o. female with medical history significant of ADD, anxiety, depression, diabetes mellitus type 2, GERD, metastatic adenocarcinoma of the ovary, seizures, and more presents ED with a chief complaint of fever.   ? ?Of note patient had a very similar admission last month where she had a fever after chemo.  Dr. Delton Coombes was consulted at that time, and determined that after reviewing cultures and work-up, the fever was likely from chemo.  Patient had her last chemo on Monday, May 1.  They had to stop the chemo early because patient was having a reaction.   ?She reports she had chills but no fever during chemo.  They gave her Benadryl and something else that she does not remember and she left her home.  When she left her home she does report that her temperature had increased to 101 but she felt fine when she got home.  She did not have any body aches or infectious symptoms.  Throughout the day she became more more feverish until her temperature became 103.  This was despite taking Tylenol and ibuprofen.  The patient came into the ED.  She denies any cough, dyspnea, dysuria, rashes on her body.  She does report right upper quadrant pain and has known gallstones and metastatic disease in her liver.  She thinks that the gallstones is causing her pain because it is worse postprandial.  She knows she cannot drink [facility] or it will trigger the pain.  She does not know any other specific foods that trigger the pain.  Patient has no other complaints at this time. ?  ?Patient does not smoke, does not drink, does not use illicit drugs.  She is not vaccinated for COVID.   ?Patient is full code. ? ? ?ED; ?Temp 98-103.1, HR 91-119, RR  18-27, BP 109/56-138/74 ?Borderline neutropenic with WBC of 4.5, hemoglobin 8.7, platelets 145 ?Chemistry shows a hyperglycemia at 370 ?Lactic acid improved from 4.3-3.5 ?UA does not indicative of UTI ?Blood culture pending / Urine culture pending ?Chest x-ray shows  no active disease ?Blood cultures from May 1 at 3:00 in the afternoon also had no growth ?She is a heart rate of 113, sinus rhythm, QTc 442 ?Patient was started on cefepime, Vanc., Flagyl ?3 L bolus in the ED- LR continue to 150 MLS per hour ?Admitted for for possible sepsis with SIRS criteria and note suspected source ? ?

## 2021-06-06 NOTE — Progress Notes (Signed)
[place] following for sepsis protocol. 

## 2021-06-06 NOTE — Progress Notes (Signed)
Pharmacy Antibiotic Note ? ?[name] a 46 y.o. female admitted on 06/06/2021 with sepsis.  Pharmacy has been consulted for vancomycin and cefepime dosing. ? ?Plan: ?Vancomycin '750mg'$  IV every 8 hours.  Goal trough 15-20 mcg/mL. ?Cefepime 2gm IV every 8 hours. ? ?Medical History: ?Past Medical History:  ?Diagnosis Date  ? ADD (attention deficit disorder)   ? Anemia, iron deficiency   ? Anxiety   ? Colon cancer ([place]) 08/2013  ? Depression   ? Diabetes ([place])   ? Difficulty swallowing pills   ? Gallstones   ? GERD (gastroesophageal reflux disease)   ? Headache   ? migraines "long time ago"  ? History of blood transfusion 08/16/2013  ? History of migraine   ? no problems in "a long time"  ? IBS (irritable bowel syndrome)   ? 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 Restless leg syndrome   ? Seizures ([place])   ? last seizure 08/2012  ? ? ?Allergies:  ?Allergies  ?Allergen Reactions  ? Adhesive [Tape] Hives, Shortness Of Breath and Swelling  ?  Tolerates paper tape  ? Clindamycin/Lincomycin Itching, Rash and Other (See Comments)  ?  Generalized body rash with itching, lip ulcers, seizures  ? Promethazine Other (See Comments)  ?  Increase restless leg movement   ? Eggs Or Egg-Derived Products Nausea And Vomiting  ? Leucovorin Calcium Other (See Comments)  ?  Severe chills, fever (See progress note from 06/05/21)  ? Metoclopramide Hcl Other (See Comments)  ?  EXACERBATES RESTLESS LEG SYNDROME  ? Oxaliplatin Other (See Comments)  ?  Severe chills, fever (See progress note from 06/05/21)  ? Oxycodone-Acetaminophen Hives  ?  Takes OXIR at home  ? Sulfa Antibiotics Nausea And Vomiting  ? Fluogen [Influenza Virus Vaccine] Nausea And Vomiting  ? Levaquin [Levofloxacin]   ?  seizures  ? Penicillins Itching  ?  Has patient had a PCN reaction causing immediate rash, facial/tongue/throat swelling, SOB or lightheadedness with hypotension: No ?Has patient had a PCN reaction causing severe rash involving mucus membranes or  skin necrosis: No ?Has patient had a PCN reaction that required hospitalization No ?Has patient had a PCN reaction occurring within the last 10 years: No ?If all of the above answers are "NO", then may proceed with Cephalosporin use. ?  ? ? ?Filed Weights  ? 06/06/21 0009  ?Weight: 69 kg (152 lb 3.2 oz)  ? ? ? ?  Latest Ref Rng & Units 06/06/2021  ?  8:30 AM 06/06/2021  ? 12:46 AM 06/05/2021  ?  8:15 AM  ?CBC  ?WBC 4.0 - 10.5 K/uL 4.3   4.5   3.4    ?Hemoglobin 12.0 - 15.0 g/dL 8.7   8.7   9.1    ?Hematocrit 36.0 - 46.0 % 29.3   28.6   30.4    ?Platelets 150 - 400 K/uL 154   145   202    ? ? ? ?Estimated Creatinine Clearance: 82.9 mL/min (by C-G formula based on SCr of 0.73 mg/dL). ? ?Antibiotics Given (last 72 hours)   ? ? Date/Time Action Medication Dose Rate  ? 06/06/21 0154 New Bag/Given  ? ceFEPIme (MAXIPIME) 2 g in sodium chloride 0.9 % 100 mL IVPB 2 g 200 mL/hr  ? 06/06/21 0251 New Bag/Given  ? metroNIDAZOLE (FLAGYL) IVPB 500 mg 500 mg 100 mL/hr  ? 06/06/21 0419 New Bag/Given  ? vancomycin (VANCOREADY) IVPB 1250 mg/250 mL 1,250 mg 166.7 mL/hr  ? ?  ? ? ?  Antimicrobials this admission: ? ?Cefepime 06/06/2021  >>  ?vancomycin 06/06/2021  >>  ?Metronidazole 06/06/2021   x 1  ? ?Microbiology results: ?06/06/2021  BCx: sent ?06/06/2021  UCx: sent ?06/06/2021  Resp Panel: negative ? ?Thank you for allowing pharmacy to be a part of this patient?s care. ? ?Thomasenia Sales, PharmD ?Clinical Pharmacist ? ? ? ?

## 2021-06-06 NOTE — ED Triage Notes (Signed)
Pt actively receiving IV chemo treatment. Pt received treatment today and began to have fever there but they let her go home. Pt states tonight the fever cam back and she hasn't been able to control it. Took '1000mg'$  tylenol at 2200 and ibuprofen at 2215. Temp 103.1 in triage.   ?

## 2021-06-06 NOTE — Progress Notes (Signed)
?PROGRESS NOTE ? ? ? ?Patient: [name]                            PCP: [name], Raymond J, MD                    ?DOB: 09/25/1975            DOA: 06/06/2021 ?DEY:[id number]             DOS: 06/06/2021, 2:15 PM ? ? LOS: 0 days  ? ?Date of Service: The patient was seen and examined on 06/06/2021 ? ?Subjective:  ? ?The patient was seen and examined this morning. ?Currently comfortable laying in bed no complaints ?Still meeting SIRS/sepsis criteria, lactic acid 3.4, procalcitonin 7.45, 815, Tmax 103.1, currently 100.1 ? ? ?Brief Narrative:  ? ? ?[name] is a 46 y.o. female with medical history significant of ADD, anxiety, depression, diabetes mellitus type 2, GERD, metastatic adenocarcinoma of the ovary, seizures, and more presents ED with a chief complaint of fever.   ? ?Of note patient had a very similar admission last month where she had a fever after chemo.  Dr. Delton Coombes was consulted at that time, and determined that after reviewing cultures and work-up, the fever was likely from chemo.  Patient had her last chemo on Monday, May 1.  They had to stop the chemo early because patient was having a reaction.   ?She reports she had chills but no fever during chemo.  They gave her Benadryl and something else that she does not remember and she left her home.  When she left her home she does report that her temperature had increased to 101 but she felt fine when she got home.  She did not have any body aches or infectious symptoms.  Throughout the day she became more more feverish until her temperature became 103.  This was despite taking Tylenol and ibuprofen.  The patient came into the ED.  She denies any cough, dyspnea, dysuria, rashes on her body.  She does report right upper quadrant pain and has known gallstones and metastatic disease in her liver.  She thinks that the gallstones is causing her pain because it is worse postprandial.  She knows she cannot drink [facility] or it will trigger the pain.  She  does not know any other specific foods that trigger the pain.  Patient has no other complaints at this time. ?  ?Patient does not smoke, does not drink, does not use illicit drugs.  She is not vaccinated for COVID.   ?Patient is full code. ? ? ?ED; ?Temp 98-103.1, HR 91-119, RR  18-27, BP 109/56-138/74 ?Borderline neutropenic with WBC of 4.5, hemoglobin 8.7, platelets 145 ?Chemistry shows a hyperglycemia at 370 ?Lactic acid improved from 4.3-3.5 ?UA does not indicative of UTI ?Blood culture pending / Urine culture pending ?Chest x-ray shows no active disease ?Blood cultures from May 1 at 3:00 in the afternoon also had no growth ?She is a heart rate of 113, sinus rhythm, QTc 442 ?Patient was started on cefepime, Vanc., Flagyl ?3 L bolus in the ED- LR continue to 150 MLS per hour ?Admitted for for possible sepsis with SIRS criteria and note suspected source ? ? ? ? ?Assessment & Plan:  ? ?Principal Problem: ?  SIRS (systemic inflammatory response syndrome) (HCC) ?Active Problems: ?  Primary malignant neoplasm of colon with metastasis in female [facility]) ?  GERD ?  Seizure ([place]) ?  Diabetes mellitus, type 2 ([place]) ? ? ? ? ?Assessment and Plan: ?* SIRS (systemic inflammatory response syndrome) (HCC) ?-Ruling out sepsis ? ?-This morning still meeting sepsis criteria temp 100.1, RR 18, PR 115, blood pressure stable-improved ? ?-Lactic acidosis lactic acid as high as 4.3, improved to 2.4>> 3.4 now ? ?- At arrival patient is febrile, tachycardic, tachypneic, with a lactic acid of 4.3  ?-Likely fever and other symptoms are related to chemo-at this point cannot rule out systemic infection, cultures are pending ? ?-Patient was started on vancomycin, Flagyl, cefepime --continue antibiotics for now ? ?-Procalcitonin elevated 7.24, ? ?-Patient was given 3 L bolus and LR 150 MLS per hour ?Continue to monitor ? ? ?Primary malignant neoplasm of colon with metastasis in female [facility]) ?Consult oncology --appreciate input from Dr.  Delton Coombes ?Last chemo May 1 ? ?Diabetes mellitus, type 2 ([place]) ?Holding 70/30 ?Carb modified diet ?Moderate sliding scale coverage ?Update hemoglobin A1c ?Continue to monitor ? ? ?Seizure ([place]) ?Continue Vimpat 200 mg twice daily ?Remained stable ? ?GERD ?Continue Protonix ? ? ? ? ?Cultures; ?Blood Cultures x 2 >> NGT ? ?--------------------------------------------------------------------------------------------------------------------- ? ?DVT prophylaxis:  ?heparin injection 5,000 Units Start: 06/06/21 0845 ?SCDs Start: 06/06/21 0800 ? ? ?Code Status:   Code Status: Full Code ? ?Family Communication: No family member present at bedside- attempt will be made to update daily ?The above findings and plan of care has been discussed with patient (and family)  in detail,  ?they expressed understanding and agreement of above. ?-Advance care planning has been discussed.  ? ?Admission status:   ?Status is: Inpatient ?Remains inpatient appropriate because: Still meeting sepsis criteria, needing IV antibiotics, IV fluids ? ? ? ? ?Procedures:  ? ?No admission procedures for hospital encounter. ? ? ?Antimicrobials:  ?Anti-infectives (From admission, onward)  ? ? Start     Dose/Rate Route Frequency Ordered Stop  ? 06/06/21 1400  vancomycin (VANCOREADY) IVPB 750 mg/150 mL       ? 750 mg ?150 mL/hr over 60 Minutes Intravenous Every 8 hours 06/06/21 1311    ? 06/06/21 1400  ceFEPIme (MAXIPIME) 2 g in sodium chloride 0.9 % 100 mL IVPB       ? 2 g ?200 mL/hr over 30 Minutes Intravenous Every 8 hours 06/06/21 1311    ? 06/06/21 0145  ceFEPIme (MAXIPIME) 2 g in sodium chloride 0.9 % 100 mL IVPB       ? 2 g ?200 mL/hr over 30 Minutes Intravenous  Once 06/06/21 0139 06/06/21 0227  ? 06/06/21 0145  metroNIDAZOLE (FLAGYL) IVPB 500 mg       ? 500 mg ?100 mL/hr over 60 Minutes Intravenous  Once 06/06/21 0139 06/06/21 0351  ? 06/06/21 0145  vancomycin (VANCOCIN) IVPB 1000 mg/200 mL premix  Status:  Discontinued       ? 1,000 mg ?200 mL/hr  over 60 Minutes Intravenous  Once 06/06/21 0139 06/06/21 0143  ? 06/06/21 0145  vancomycin (VANCOREADY) IVPB 1250 mg/250 mL       ? 1,250 mg ?166.7 mL/hr over 90 Minutes Intravenous  Once 06/06/21 0143 06/06/21 0550  ? ?  ? ? ? ?Medication:  ? heparin  5,000 Units Subcutaneous Q8H  ? insulin aspart  0-15 Units Subcutaneous TID WC  ? insulin aspart  0-5 Units Subcutaneous QHS  ? lacosamide  200 mg Oral BID  ? pantoprazole  40 mg Oral BID  ? spironolactone  50 mg Oral Daily  ? ? ?  acetaminophen **OR** acetaminophen, hydrOXYzine, LORazepam, ondansetron **OR** ondansetron (ZOFRAN) IV, oxyCODONE ? ? ?Objective:  ? ?Vitals:  ? 06/06/21 0741 06/06/21 0755 06/06/21 1200 06/06/21 1252  ?BP:  131/70 134/69   ?Pulse:  93 (!) 115   ?Resp:   18   ?Temp: 98.3 ?F (36.8 ?C) 98.6 ?F (37 ?C) 99.8 ?F (37.7 ?C) 100.1 ?F (37.8 ?C)  ?TempSrc: Oral Oral Axillary Axillary  ?SpO2:  100% 98%   ?Weight:      ?Height:      ? ?No intake or output data in the 24 hours ending 06/06/21 1415 ?Filed Weights  ? 06/06/21 0009  ?Weight: 69 kg  ? ? ? ?Examination:  ? ?Physical Exam  ?Constitution:  Alert, cooperative, no distress,  Appears calm and comfortable  ?Psychiatric:   Normal and stable mood and affect, cognition intact,   ?HEENT:        Normocephalic, PERRL, otherwise with in Normal limits  ?Chest:         Chest symmetric ?Cardio vascular:  S1/S2, RRR, No murmure, No Rubs or Gallops  ?pulmonary: Clear to auscultation bilaterally, respirations unlabored, negative wheezes / crackles ?Abdomen: Soft, non-tender, non-distended, bowel sounds,no masses, no organomegaly ?Muscular skeletal: Limited exam - in bed, able to move all 4 extremities,   ?Neuro: CNII-XII intact. , normal motor and sensation, reflexes intact  ?Extremities: No pitting edema lower extremities, +2 pulses  ?Skin: Dry, warm to touch, negative for any Rashes, No open wounds ?Wounds: per nursing  documentation ? ? ?------------------------------------------------------------------------------------------------------------------------------------------ ?  ? ?LABs:  ? ?  Latest Ref Rng & Units 06/06/2021  ?  8:30 AM 06/06/2021  ? 12:46 AM 06/05/2021  ?  8:15 AM  ?CBC  ?WBC 4.0 - 10.5 K/uL 4.3   4.5   3.4

## 2021-06-06 NOTE — Assessment & Plan Note (Addendum)
-  Ruling out sepsis ? ?-This morning still meeting sepsis criteria temp 100.1, RR 18, PR 115, blood pressure stable-improved ? ?-Lactic acidosis lactic acid as high as 4.3, improved to 2.4>> 3.4 now ? ?- At arrival patient is febrile, tachycardic, tachypneic, with a lactic acid of 4.3  ?-Likely fever and other symptoms are related to chemo-at this point cannot rule out systemic infection, cultures are pending ? ?-Patient was started on vancomycin, Flagyl, cefepime --continue antibiotics for now ? ?-Procalcitonin elevated 7.24, ? ?-Patient was given 3 L bolus and LR 150 MLS per hour ?Continue to monitor ? ?

## 2021-06-06 NOTE — H&P (Signed)
?History and Physical  ? ? ?Patient: [name] GUY:[id number] DOB: August 20, 1975 ?DOA: 06/06/2021 ?DOS: the patient was seen and examined on 06/06/2021 ?PCP: [name], Raymond J, MD  ?Patient coming from: Home ? ?Chief Complaint:  ?Chief Complaint  ?Patient presents with  ? Fever  ? ?HPI: [name] is a 46 y.o. female with medical history significant of ADD, anxiety, depression, diabetes mellitus type 2, GERD, metastatic adenocarcinoma of the ovary, seizures, and more presents ED with a chief complaint of fever.  Of note patient had a very similar admission last month where she had a fever after chemo.  Dr. Delton Coombes was consulted at that time, and determined that after reviewing cultures and work-up, the fever was likely from chemo.  Patient had her last chemo on Monday, May 1.  They had to stop the chemo early because patient was having a reaction.  She reports she had chills but no fever during chemo.  They gave her Benadryl and something else that she does not remember and she left her home.  When she left her home she does report that her temperature had increased to 101 but she felt fine when she got home.  She did not have any body aches or infectious symptoms.  Throughout the day she became more more feverish until her temperature became 103.  This was despite taking Tylenol and ibuprofen.  The patient came into the ED.  She denies any cough, dyspnea, dysuria, rashes on her body.  She does report right upper quadrant pain and has known gallstones and metastatic disease in her liver.  She thinks that the gallstones is causing her pain because it is worse postprandial.  She knows she cannot drink [facility] or it will trigger the pain.  She does not know any other specific foods that trigger the pain.  Patient has no other complaints at this time. ? ?Patient does not smoke, does not drink, does not use illicit drugs.  She is not vaccinated for COVID.  Patient is full code. ?Review of Systems: As mentioned  in the history of present illness. All other systems reviewed and are negative. ?Past Medical History:  ?Diagnosis Date  ? ADD (attention deficit disorder)   ? Anemia, iron deficiency   ? Anxiety   ? Colon cancer ([place]) 08/2013  ? Depression   ? Diabetes ([place])   ? Difficulty swallowing pills   ? Gallstones   ? GERD (gastroesophageal reflux disease)   ? Headache   ? migraines "long time ago"  ? History of blood transfusion 08/16/2013  ? History of migraine   ? no problems in "a long time"  ? IBS (irritable bowel syndrome)   ? Metastatic adenocarcinoma of ovary, right ([place]) 12/2014  ? Restless leg syndrome   ? Seizures ([place])   ? last seizure 08/2012  ? ?Past Surgical History:  ?Procedure Laterality Date  ? ABDOMINAL HYSTERECTOMY  08/24/2004  ? partial  ? APPENDECTOMY  08/24/2004  ? COLON SURGERY  08/11/2013  ? removed a foot of colon  ? COLONOSCOPY  2019  ? colonoscopy 10-18-14    ? COLONOSCOPY WITH PROPOFOL  07/07/2013  ? CYSTOSCOPY  11/01/2003  ? ENDOMETRIAL FULGURATION  11/01/2003  ? ESOPHAGOGASTRODUODENOSCOPY  07/07/2013  ? IR ANGIOGRAM SELECTIVE EACH ADDITIONAL VESSEL  11/03/2020  ? IR ANGIOGRAM SELECTIVE EACH ADDITIONAL VESSEL  11/03/2020  ? IR ANGIOGRAM SELECTIVE EACH ADDITIONAL VESSEL  11/03/2020  ? IR ANGIOGRAM SELECTIVE EACH ADDITIONAL VESSEL  11/03/2020  ? IR  [place] ADDITIONAL VESSEL  11/17/2020  ? IR ANGIOGRAM SELECTIVE EACH ADDITIONAL VESSEL  11/17/2020  ? IR ANGIOGRAM SELECTIVE EACH ADDITIONAL VESSEL  11/17/2020  ? IR ANGIOGRAM SELECTIVE EACH ADDITIONAL VESSEL  12/08/2020  ? IR ANGIOGRAM SELECTIVE EACH ADDITIONAL VESSEL  12/08/2020  ? IR ANGIOGRAM VISCERAL SELECTIVE  11/03/2020  ? IR ANGIOGRAM VISCERAL SELECTIVE  11/03/2020  ? IR ANGIOGRAM VISCERAL SELECTIVE  11/17/2020  ? IR ANGIOGRAM VISCERAL SELECTIVE  12/08/2020  ? IR EMBO ARTERIAL NOT HEMORR HEMANG INC GUIDE ROADMAPPING  11/03/2020  ? IR EMBO TUMOR ORGAN ISCHEMIA INFARCT INC GUIDE ROADMAPPING  11/17/2020  ? IR EMBO TUMOR ORGAN ISCHEMIA INFARCT INC GUIDE  ROADMAPPING  12/08/2020  ? IR IMAGING GUIDED PORT INSERTION  03/05/2019  ? IR PARACENTESIS  05/30/2021  ? IR RADIOLOGIST EVAL & MGMT  10/06/2020  ? IR RADIOLOGIST EVAL & MGMT  01/17/2021  ? IR RADIOLOGIST EVAL & MGMT  03/17/2021  ? IR US GUIDE VASC ACCESS RIGHT  11/03/2020  ? IR US GUIDE VASC ACCESS RIGHT  11/17/2020  ? IR US GUIDE VASC ACCESS RIGHT  12/08/2020  ? LAPAROSCOPIC LYSIS OF ADHESIONS  11/01/2003  ? LAPAROSCOPIC PARTIAL COLECTOMY Right 08/11/2013  ? Procedure: LAPAROSCOPIC RIGHT HEMICOLECTOMY;  Surgeon: Stark Klein, MD;  Location: [facility];  Service: General;  Laterality: Right;  ? LAPAROTOMY N/A 12/28/2014  ? Procedure: EXPLORATORY LAPAROTOMY;  Surgeon: Everitt Amber, MD;  Location: [facility];  Service: Gynecology;  Laterality: N/A;  ? LEFT OOPHORECTOMY  08/24/2004  ? PANNICULECTOMY  08/24/2004  ? PORT-A-CATH REMOVAL Left 06/08/2014  ? Procedure: REMOVAL PORT-A-CATH;  Surgeon: Stark Klein, MD;  Location: [facility];  Service: General;  Laterality: Left;  ? PORTACATH PLACEMENT Left 09/09/2013  ? Procedure: INSERTION PORT-A-CATH;  Surgeon: Stark Klein, MD;  Location: [facility];  Service: General;  Laterality: Left;  ? SALPINGOOPHORECTOMY Right 12/28/2014  ? Procedure: RIGHT SALPINGO OOPHORECTOMY;  Surgeon: Everitt Amber, MD;  Location: [facility];  Service: Gynecology;  Laterality: Right;  ? TUBAL LIGATION  11/07/2000  ? UNILATERAL SALPINGECTOMY Left 08/24/2004  ? [place] EXTRACTION Bilateral 1994  ? ?Social History:  reports that she has never smoked. She has never used smokeless tobacco. She reports that she does not drink alcohol and does not use drugs. ? ?Allergies  ?Allergen Reactions  ? Adhesive [Tape] Hives, Shortness Of Breath and Swelling  ?  Tolerates paper tape  ? Clindamycin/Lincomycin Itching, Rash and Other (See Comments)  ?  Generalized body rash with itching, lip ulcers, seizures  ? Promethazine Other (See Comments)  ?  Increase restless leg movement   ? Eggs Or Egg-Derived Products Nausea And Vomiting  ?  Leucovorin Calcium Other (See Comments)  ?  Severe chills, fever (See progress note from 06/05/21)  ? Metoclopramide Hcl Other (See Comments)  ?  EXACERBATES RESTLESS LEG SYNDROME  ? Oxaliplatin Other (See Comments)  ?  Severe chills, fever (See progress note from 06/05/21)  ? Oxycodone-Acetaminophen Hives  ? Sulfa Antibiotics Nausea And Vomiting  ? Fluogen [Influenza Virus Vaccine] Nausea And Vomiting  ? Levaquin [Levofloxacin]   ?  seizures  ? Penicillins Itching  ?  Has patient had a PCN reaction causing immediate rash, facial/tongue/throat swelling, SOB or lightheadedness with hypotension: No ?Has patient had a PCN reaction causing severe rash involving mucus membranes or skin necrosis: No ?Has patient had a PCN reaction that required hospitalization No ?Has patient had a PCN reaction occurring within the last 10 years: No ?If all  of the above answers are "NO", then may proceed with Cephalosporin use. ?  ? ? ?Family History  ?Problem Relation Age of Onset  ? Colon polyps Mother   ?     3+ colon polyps at each colonoscopy - "several"  ? Colitis Mother   ? Heart attack Mother   ? Diabetes Mother   ? Heart disease Mother   ? Uterine cancer Mother   ? Diabetes Father   ? Heart attack Father   ? Stroke Father   ? Congestive Heart Failure Father   ? Heart disease Father   ? Breast cancer Sister   ? Ulcerative colitis Sister   ? Other Paternal Grandfather   ?     "bone cancer"  ? Cirrhosis Maternal Aunt   ? Breast cancer Maternal Aunt   ?     dx 50s-60s; s/p lumpectomy  ? Dementia Maternal Aunt   ? Heart disease Maternal Aunt   ? Lung cancer Maternal Uncle   ?     d. 17s; smoker and worked around asbestos  ? Colon cancer Maternal Uncle   ?     dx early 38s  ? Dementia Paternal Aunt   ? Dementia Paternal Uncle   ? Prostate cancer Paternal Uncle 69  ? Other Cousin   ?     maternal 1st cousin w/ "lung issues"  ? Esophageal cancer Neg Hx   ? Rectal cancer Neg Hx   ? Stomach cancer Neg Hx   ? ? ?Prior to Admission medications    ?Medication Sig Start Date End Date Taking? Authorizing Provider  ?Accu-Chek Softclix Lancets lancets SMARTSIG:2 Topical Twice Daily 07/22/20   [provider]  ?acetaminophen (TYLENOL) 500

## 2021-06-06 NOTE — Assessment & Plan Note (Signed)
Holding 70/30 ?Carb modified diet ?Moderate sliding scale coverage ?Update hemoglobin A1c ?Continue to monitor ? ?

## 2021-06-06 NOTE — TOC Initial Note (Signed)
Transition of Care (TOC) - Initial/Assessment Note  ? ? ?Patient Details  ?Name: [name] ?MRN: [id number] ?Date of Birth: 1976-01-09 ? ?Transition of Care (TOC) CM/SW Contact:    ?Shade Flood, LCSW ?Phone Number: ?06/06/2021, 1:40 PM ? ?Clinical Narrative:                 ? ?Pt admitted from home. She has a high readmission risk score. Met with pt at bedside to assess. Per pt, she lives with her husband and is independent in ADLs at baseline. Pt reports that she plans to return home at dc and she is not anticipating any TOC needs. Pt states she is able to get to appointments and obtain medications.  ? ?TOC will follow and assist if needed. ? ?Expected Discharge Plan: Home/Self Care ?Barriers to Discharge: Continued Medical Work up ? ? ?Patient Goals and CMS Choice ?Patient states their goals for this hospitalization and ongoing recovery are:: go home ?  ?  ? ?Expected Discharge Plan and Services ?Expected Discharge Plan: Home/Self Care ?In-house Referral: Clinical Social Work ?  ?  ?Living arrangements for the past 2 months: [place] ?                ?  ?  ?  ?  ?  ?  ?  ?  ?  ?  ? ?Prior Living Arrangements/Services ?Living arrangements for the past 2 months: [place] ?Lives with:: Spouse ?Patient language and need for interpreter reviewed:: Yes ?Do you feel safe going back to the place where you live?: Yes      ?Need for Family Participation in Patient Care: No (Comment) ?Care giver support system in place?: Yes (comment) ?  ?Criminal Activity/Legal Involvement Pertinent to Current Situation/Hospitalization: No - Comment as needed ? ?Activities of Daily Living ?Home Assistive Devices/Equipment: None ?ADL Screening (condition at time of admission) ?Patient's cognitive ability adequate to safely complete daily activities?: Yes ?Is the patient deaf or have difficulty hearing?: No ?Does the patient have difficulty seeing, even when wearing glasses/contacts?: No ?Does the patient have  difficulty concentrating, remembering, or making decisions?: No ?Patient able to express need for assistance with ADLs?: Yes ?Does the patient have difficulty dressing or bathing?: No ?Independently performs ADLs?: Yes (appropriate for developmental age) ?Does the patient have difficulty walking or climbing stairs?: No ?Weakness of Legs: None ?Weakness of Arms/Hands: None ? ?Permission Sought/Granted ?  ?  ?   ?   ?   ?   ? ?Emotional Assessment ?  ?Attitude/Demeanor/Rapport: Engaged ?Affect (typically observed): Pleasant ?Orientation: : Oriented to Self, Oriented to Place, Oriented to  Time, Oriented to Situation ?Alcohol / Substance Use: Not Applicable ?Psych Involvement: No (comment) ? ?Admission diagnosis:  Fever of unknown origin [R50.9] ?SIRS (systemic inflammatory response syndrome) (HCC) [R65.10] ?Sepsis, due to unspecified organism, unspecified whether acute organ dysfunction present ([place]) [A41.9] ?Patient Active Problem List  ? Diagnosis Date Noted  ? SIRS (systemic inflammatory response syndrome) ([place]) 06/06/2021  ? Sepsis ([place]) 04/25/2021  ? Diabetes mellitus, type 2 ([place]) 06/02/2020  ? UTI (urinary tract infection) 06/02/2020  ? Liver metastases   ? Metastatic colon cancer to liver [facility])   ? Palliative care by specialist   ? COVID-19 virus infection 09/17/2019  ? Port-A-Cath in place 06/04/2019  ? Seizure ([place]) 04/10/2019  ? Goals of care, counseling/discussion 02/23/2019  ? 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 Ileus ([place])   ? Metastatic malignant neoplasm ([place])   ? Genetic testing  11/13/2015  ? Sinusitis 01/29/2015  ? Pelvic mass in female 12/28/2014  ? Ovarian mass, right   ? Hypokalemia 02/03/2014  ? Nausea 12/20/2013  ? Fever, unspecified 12/18/2013  ? Dehydration 12/18/2013  ? Diarrhea 10/28/2013  ? Malfunction of device 10/28/2013  ? Weight loss 10/28/2013  ? Mucositis (ulcerative) due to antineoplastic therapy 10/28/2013  ? Anemia due to antineoplastic chemotherapy 09/25/2013  ? Chemotherapy  induced thrombocytopenia 09/25/2013  ? Hypomagnesemia 09/25/2013  ? Hypophosphatemia 09/25/2013  ? Adverse drug effect 09/25/2013  ? Abdominal pain 09/24/2013  ? Primary malignant neoplasm of colon with metastasis in female [facility]) 08/05/2013  ? Anemia, iron deficiency 01/28/2013  ? 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[place]) 11/11/2012  ? Discoloration of skin of face 09/03/2012  ? Medication reaction 05/03/2012  ? Migraine 03/31/2012  ? Hyperglycemia 01/18/2012  ? Fatigue 12/12/2011  ? ADD (attention deficit disorder) 09/23/2010  ? Right wrist pain 05/01/2010  ? LIBIDO, DECREASED 04/07/2010  ? IRON DEFICIENCY 05/17/2009  ? ANA POSITIVE 05/17/2009  ? OVARIAN CYST 05/10/2009  ? FATTY LIVER DISEASE 03/24/2009  ? TRANSAMINASES, SERUM, ELEVATED 03/21/2009  ? Gastroparesis 08/16/2008  ? DEPRESSION/ANXIETY 09/19/2006  ? HYPERLIPIDEMIA 08/16/2006  ? RESTLESS LEG SYNDROME 08/16/2006  ? GERD 08/16/2006  ? IBS 08/16/2006  ? ?PCP:  [name], Raymond J, MD ?Pharmacy:   ?[place], [place] [address] ?[address] ?[place] [address] ?Phone: [phone] Fax: [phone] ? ? ? ? ?Social Determinants of Health (SDOH) Interventions ?  ? ?Readmission Risk Interventions ? ?  06/06/2021  ?  1:40 PM  ?Readmission Risk Prevention Plan  ?Transportation Screening Complete  ?Medication Review Press photographer) Complete  ?[place] or Home Care Consult Complete  ?SW Recovery Care/Counseling Consult Complete  ?Palliative Care Screening Not Applicable  ?[place] Not Applicable  ? ? ? ?

## 2021-06-06 NOTE — ED Notes (Signed)
Report called to [place], RN on 3rd floor ?

## 2021-06-07 ENCOUNTER — Inpatient Hospital Stay: Payer: BC Managed Care – PPO

## 2021-06-07 DIAGNOSIS — G40909 Epilepsy, unspecified, not intractable, without status epilepticus: Secondary | ICD-10-CM | POA: Diagnosis not present

## 2021-06-07 DIAGNOSIS — Z794 Long term (current) use of insulin: Secondary | ICD-10-CM | POA: Diagnosis not present

## 2021-06-07 DIAGNOSIS — R6510 Systemic inflammatory response syndrome (SIRS) of non-infectious origin without acute organ dysfunction: Secondary | ICD-10-CM | POA: Diagnosis not present

## 2021-06-07 DIAGNOSIS — E876 Hypokalemia: Secondary | ICD-10-CM | POA: Diagnosis not present

## 2021-06-07 DIAGNOSIS — R509 Fever, unspecified: Secondary | ICD-10-CM | POA: Diagnosis not present

## 2021-06-07 DIAGNOSIS — C189 Malignant neoplasm of colon, unspecified: Secondary | ICD-10-CM | POA: Diagnosis not present

## 2021-06-07 DIAGNOSIS — Z20822 Contact with and (suspected) exposure to covid-19: Secondary | ICD-10-CM | POA: Diagnosis not present

## 2021-06-07 DIAGNOSIS — Z79899 Other long term (current) drug therapy: Secondary | ICD-10-CM | POA: Diagnosis not present

## 2021-06-07 DIAGNOSIS — K219 Gastro-esophageal reflux disease without esophagitis: Secondary | ICD-10-CM | POA: Diagnosis not present

## 2021-06-07 DIAGNOSIS — D6481 Anemia due to antineoplastic chemotherapy: Secondary | ICD-10-CM | POA: Diagnosis not present

## 2021-06-07 DIAGNOSIS — E119 Type 2 diabetes mellitus without complications: Secondary | ICD-10-CM | POA: Diagnosis not present

## 2021-06-07 DIAGNOSIS — C787 Secondary malignant neoplasm of liver and intrahepatic bile duct: Secondary | ICD-10-CM | POA: Diagnosis not present

## 2021-06-07 DIAGNOSIS — D696 Thrombocytopenia, unspecified: Secondary | ICD-10-CM | POA: Diagnosis not present

## 2021-06-07 LAB — COMPREHENSIVE METABOLIC PANEL
ALT: 12 U/L (ref 0–44)
AST: 25 U/L (ref 15–41)
Albumin: 2.1 g/dL — ABNORMAL LOW (ref 3.5–5.0)
Alkaline Phosphatase: 105 U/L (ref 38–126)
Anion gap: 5 (ref 5–15)
BUN: 12 mg/dL (ref 6–20)
CO2: 24 mmol/L (ref 22–32)
Calcium: 8.3 mg/dL — ABNORMAL LOW (ref 8.9–10.3)
Chloride: 104 mmol/L (ref 98–111)
Creatinine, Ser: 0.54 mg/dL (ref 0.44–1.00)
GFR, Estimated: >60 mL/min (ref >60)
Glucose, Bld: 234 mg/dL — ABNORMAL HIGH (ref 70–99)
Potassium: 3.4 mmol/L — ABNORMAL LOW (ref 3.5–5.1)
Sodium: 133 mmol/L — ABNORMAL LOW (ref 135–145)
Total Bilirubin: 2.3 mg/dL — ABNORMAL HIGH (ref 0.3–1.2)
Total Protein: 6.1 g/dL — ABNORMAL LOW (ref 6.5–8.1)

## 2021-06-07 LAB — GLUCOSE, CAPILLARY
Glucose-Capillary: 201 mg/dL — ABNORMAL HIGH (ref 70–99)
Glucose-Capillary: 193 mg/dL — ABNORMAL HIGH (ref 70–99)
Glucose-Capillary: 223 mg/dL — ABNORMAL HIGH (ref 70–99)
Glucose-Capillary: 174 mg/dL — ABNORMAL HIGH (ref 70–99)

## 2021-06-07 LAB — CBC WITH DIFFERENTIAL/PLATELET
Abs Immature Granulocytes: 0.01 10*3/uL (ref 0.00–0.07)
Basophils Absolute: 0 10*3/uL (ref 0.0–0.1)
Basophils Relative: 0 %
Eosinophils Absolute: 0.2 10*3/uL (ref 0.0–0.5)
Eosinophils Relative: 9 %
HCT: 25.8 % — ABNORMAL LOW (ref 36.0–46.0)
Hemoglobin: 7.9 g/dL — ABNORMAL LOW (ref 12.0–15.0)
Immature Granulocytes: 0 %
Lymphocytes Relative: 14 %
Lymphs Abs: 0.4 10*3/uL — ABNORMAL LOW (ref 0.7–4.0)
MCH: 25.2 pg — ABNORMAL LOW (ref 26.0–34.0)
MCHC: 30.6 g/dL (ref 30.0–36.0)
MCV: 82.2 fL (ref 80.0–100.0)
Monocytes Absolute: 0.2 10*3/uL (ref 0.1–1.0)
Monocytes Relative: 7 %
Neutro Abs: 1.9 10*3/uL (ref 1.7–7.7)
Neutrophils Relative %: 70 %
Platelets: 134 10*3/uL — ABNORMAL LOW (ref 150–400)
RBC: 3.14 MIL/uL — ABNORMAL LOW (ref 3.87–5.11)
RDW: 20.1 % — ABNORMAL HIGH (ref 11.5–15.5)
WBC: 2.7 10*3/uL — ABNORMAL LOW (ref 4.0–10.5)
nRBC: 0 % (ref 0.0–0.2)

## 2021-06-07 LAB — MAGNESIUM: Magnesium: 1.6 mg/dL — ABNORMAL LOW (ref 1.7–2.4)

## 2021-06-07 LAB — CORTISOL-AM, BLOOD: Cortisol - AM: 15.9 ug/dL (ref 6.7–22.6)

## 2021-06-07 LAB — URINE CULTURE: Culture: NO GROWTH

## 2021-06-07 LAB — PROTIME-INR
INR: 1.5 — ABNORMAL HIGH (ref 0.8–1.2)
Prothrombin Time: 17.7 seconds — ABNORMAL HIGH (ref 11.4–15.2)

## 2021-06-07 LAB — PROCALCITONIN: Procalcitonin: 5.4 ng/mL

## 2021-06-07 MED ORDER — POTASSIUM CHLORIDE CRYS ER 20 MEQ PO TBCR
40.0000 meq | EXTENDED_RELEASE_TABLET | Freq: Once | ORAL | Status: AC
Start: 2021-06-07 — End: 2021-06-07
  Administered 2021-06-07: 40 meq via ORAL
  Filled 2021-06-07: qty 2

## 2021-06-07 MED ORDER — MAGNESIUM SULFATE 2 GM/50ML IV SOLN
2.0000 g | Freq: Once | INTRAVENOUS | Status: AC
  Administered 2021-06-07: 2 g via INTRAVENOUS
  Filled 2021-06-07: qty 50

## 2021-06-07 MED ORDER — CHLORHEXIDINE GLUCONATE CLOTH 2 % EX PADS
6.0000 | MEDICATED_PAD | Freq: Every day | CUTANEOUS | Status: DC
  Administered 2021-06-07 – 2021-06-08 (×2): 6 via TOPICAL

## 2021-06-07 NOTE — Discharge Planning (Signed)
Oncology Discharge Planning Admission Note ? ?[facility] [place] ?Address: [address] [place], [place], [place] [address] ?Hours of Operation:  8am - 5pm, Monday - Friday  ?Clinic Contact Information:  [phone] ? ?Oncology Care Team: ?Medical Oncologist:  Dr. Betsy Coder ? ?Contacted Drina Jobst to inform that the oncology provider Dr. Benay Spice is aware of this hospital admission dated 06/06/21, and the cancer center will follow [facility] inpatient care to assist with discharge planning as indicated by the oncologist.  We will reach out to you closer to discharge date to arrange your follow up care. ? ?Disclaimer:  This [place] note does not imply a formal consult request has been made by the admitting attending for this admission or there will be an inpatient consult completed by oncology.  Please request oncology consults as per standard process as indicated. ?

## 2021-06-07 NOTE — Progress Notes (Signed)
?PROGRESS NOTE ? ? ? ?[name]  QJJ:[id number] DOB: [birth year] DOA: 06/06/2021 ?PCP: [name], Raymond J, MD  ? ?  ?Brief Narrative:  ?[name] is a 46 y.o. female with medical history significant of ADD, anxiety, depression, diabetes mellitus type 2, GERD, metastatic adenocarcinoma of the ovary, seizures, and more presents ED with a chief complaint of fever.   ?  ?Of note patient had a very similar admission last month where she had a fever after chemo.  Dr. Delton Coombes was consulted at that time, and determined that after reviewing cultures and work-up, the fever was likely from chemo.  Patient had her last chemo on Monday, May 1.  They had to stop the chemo early because patient was having a reaction.   ? ?She reports she had chills but no fever during chemo.  They gave her Benadryl and something else that she does not remember and she left her home.  When she left her home she does report that her temperature had increased to 101 but she felt fine when she got home.  She did not have any body aches or infectious symptoms.  Throughout the day she became more more feverish until her temperature became 103.  This was despite taking Tylenol and ibuprofen.  The patient came into the ED.   ? ?Patient underwent fever work-up and was started on broad-spectrum antibiotics due to her immune suppressed status. ? ?New events last 24 hours / Subjective: ?Fever last night 100.5.  She admits to feeling weak but no other complaints.  Denies shortness of breath, pain, vomiting, diarrhea, dysuria.  She has no skin wounds or rashes. ? ?Assessment & Plan: ?  ? ?Principal Problem: ?  SIRS (systemic inflammatory response syndrome) (HCC) ?Active Problems: ?  Primary malignant neoplasm of colon with metastasis in female [facility]) ?  Hypokalemia ?  GERD ?  Hypomagnesemia ?  Seizure ([place]) ?  Diabetes mellitus, type 2 ([place]) ? ? ?SIRS ?-Without known source of fevers or infection ?-Could be related to chemotherapy ?-Blood cultures  pending ?-UA negative, urine culture negative ?-Influenza, COVID-negative ?-Remains on broad-spectrum IV antibiotics vancomycin, cefepime ? ?Metastatic colon cancer ?-Followed by Dr. Benay Spice ? ?Diabetes mellitus type 2 ?-A1c pending  ?-SSI ? ?Seizure disorder ?-Continue Vimpat ? ?Hypokalemia ?-Replace ? ?Hypomagnesemia ?-Replace ? ?GERD ?-PPI ? ?Anxiety ?-Ativan, hydroxyzine prn  ? ?DVT prophylaxis:  ?heparin injection 5,000 Units Start: 06/06/21 0845 ?SCDs Start: 06/06/21 0800 ? ?Code Status: Full ?Family Communication: None at bedside ?Disposition Plan:  ?Status is: Inpatient ?Remains inpatient appropriate because: IV antibiotics  ? ?Consultants:  ?Oncology  ? ?Procedures:  ?None  ? ?Antimicrobials:  ?Anti-infectives (From admission, onward)  ? ? Start     Dose/Rate Route Frequency Ordered Stop  ? 06/06/21 1400  vancomycin (VANCOREADY) IVPB 750 mg/150 mL       ? 750 mg ?150 mL/hr over 60 Minutes Intravenous Every 8 hours 06/06/21 1311    ? 06/06/21 1400  ceFEPIme (MAXIPIME) 2 g in sodium chloride 0.9 % 100 mL IVPB       ? 2 g ?200 mL/hr over 30 Minutes Intravenous Every 8 hours 06/06/21 1311    ? 06/06/21 0145  ceFEPIme (MAXIPIME) 2 g in sodium chloride 0.9 % 100 mL IVPB       ? 2 g ?200 mL/hr over 30 Minutes Intravenous  Once 06/06/21 0139 06/06/21 0227  ? 06/06/21 0145  metroNIDAZOLE (FLAGYL) IVPB 500 mg       ? 500  mg ?100 mL/hr over 60 Minutes Intravenous  Once 06/06/21 0139 06/06/21 0351  ? 06/06/21 0145  vancomycin (VANCOCIN) IVPB 1000 mg/200 mL premix  Status:  Discontinued       ? 1,000 mg ?200 mL/hr over 60 Minutes Intravenous  Once 06/06/21 0139 06/06/21 0143  ? 06/06/21 0145  vancomycin (VANCOREADY) IVPB 1250 mg/250 mL       ? 1,250 mg ?166.7 mL/hr over 90 Minutes Intravenous  Once 06/06/21 0143 06/06/21 0550  ? ?  ? ? ? ?Objective: ?Vitals:  ? 06/06/21 2218 06/06/21 2350 06/07/21 0006 06/07/21 0449  ?BP:   117/67 114/68  ?Pulse:   98 92  ?Resp:   18 18  ?Temp: (!) 100.5 ?F (38.1 ?C) 98.9 ?F (37.2 ?C)  98.9 ?F (37.2 ?C) 98.7 ?F (37.1 ?C)  ?TempSrc: Oral  Oral Oral  ?SpO2:   99% 99%  ?Weight:      ?Height:      ? ? ?Intake/Output Summary (Last 24 hours) at 06/07/2021 1036 ?Last data filed at 06/07/2021 0700 ?Gross per 24 hour  ?Intake 1220 ml  ?Output 400 ml  ?Net 820 ml  ? ?Filed Weights  ? 06/06/21 0009  ?Weight: 69 kg  ? ? ?Examination:  ?General exam: Appears calm and comfortable  ?Respiratory system: Clear to auscultation. Respiratory effort normal. No respiratory distress. No conversational dyspnea.  ?Cardiovascular system: S1 & S2 heard, RRR. No murmurs. No pedal edema. ?Gastrointestinal system: Abdomen is nondistended, soft and nontender. Normal bowel sounds heard. ?Central nervous system: Alert and oriented. No focal neurological deficits. Speech clear.  ?Extremities: Symmetric in appearance  ?Skin: No rashes, lesions or ulcers on exposed skin  ?Psychiatry: Judgement and insight appear normal. Mood & affect appropriate.  ? ?Data Reviewed: I have personally reviewed following labs and imaging studies ? ?CBC: ?Recent Labs  ?Lab 06/05/21 ?3007 06/06/21 ?6226 06/06/21 ?0830 06/07/21 ?0452  ?WBC 3.4* 4.5 4.3 2.7*  ?NEUTROABS 2.4 4.2 3.8 1.9  ?HGB 9.1* 8.7* 8.7* 7.9*  ?HCT 30.4* 28.6* 29.3* 25.8*  ?MCV 81.7 83.1 84.0 82.2  ?PLT 202 145* 154 134*  ? ?Basic Metabolic Panel: ?Recent Labs  ?Lab 06/05/21 ?3335 06/06/21 ?4562 06/07/21 ?5638  ?NA 136 132* 133*  ?K 3.7 4.1 3.4*  ?CL 102 101 104  ?CO2 25 21* 24  ?GLUCOSE 160* 370* 234*  ?BUN '9 12 12  '$ ?CREATININE 0.45 0.73 0.54  ?CALCIUM 9.9 8.8* 8.3*  ?MG 1.8  --  1.6*  ? ?GFR: ?Estimated Creatinine Clearance: 82.9 mL/min (by C-G formula based on SCr of 0.54 mg/dL). ?Liver Function Tests: ?Recent Labs  ?Lab 06/05/21 ?9373 06/06/21 ?4287 06/07/21 ?6811  ?AST 17 32 25  ?ALT '9 15 12  '$ ?ALKPHOS 141* 139* 105  ?BILITOT 1.0 1.5* 2.3*  ?PROT 7.3 7.7 6.1*  ?ALBUMIN 3.0* 2.7* 2.1*  ? ?No results for input(s): LIPASE, AMYLASE in the last 168 hours. ?No results for input(s): AMMONIA in  the last 168 hours. ?Coagulation Profile: ?Recent Labs  ?Lab 06/06/21 ?5726 06/07/21 ?2035  ?INR 1.4* 1.5*  ? ?Cardiac Enzymes: ?No results for input(s): CKTOTAL, CKMB, CKMBINDEX, TROPONINI in the last 168 hours. ?BNP (last 3 results) ?No results for input(s): PROBNP in the last 8760 hours. ?HbA1C: ?No results for input(s): HGBA1C in the last 72 hours. ?CBG: ?Recent Labs  ?Lab 06/06/21 ?0433 06/06/21 ?1207 06/06/21 ?1621 06/06/21 ?2055 06/07/21 ?0747  ?GLUCAP 249* 224* 259* 180* 201*  ? ?Lipid Profile: ?No results for input(s): CHOL, HDL, LDLCALC, TRIG, CHOLHDL, LDLDIRECT in the  last 72 hours. ?Thyroid Function Tests: ?No results for input(s): TSH, T4TOTAL, FREET4, T3FREE, THYROIDAB in the last 72 hours. ?Anemia Panel: ?No results for input(s): VITAMINB12, FOLATE, FERRITIN, TIBC, IRON, RETICCTPCT in the last 72 hours. ?Sepsis Labs: ?Recent Labs  ?Lab 06/06/21 ?0244 06/06/21 ?0500 06/06/21 ?0830 06/06/21 ?1015 06/07/21 ?0452  ?PROCALCITON  --   --  7.45  --  5.40  ?LATICACIDVEN 3.5* 2.8* 2.4* 3.4*  --   ? ? ?Recent Results (from the past 240 hour(s))  ?Culture, Blood     Status: None (Preliminary result)  ? Collection Time: 06/05/21  3:19 PM  ? Specimen: Left Antecubital; Blood  ?Result Value Ref Range Status  ? Specimen Description   Final  ?  LEFT ANTECUBITAL ?Performed at [facility], [address], [place], [place] [address] ?  ? Special Requests   Final  ?  BOTTLES DRAWN AEROBIC AND ANAEROBIC Blood Culture results may not be optimal due to an excessive volume of blood received in culture bottles ?Performed at [facility], [address], [place], [place] [address] ?  ? Culture   Final  ?  NO GROWTH 2 DAYS ?Performed at [facility], [place] [address], [place], [place] [address] ?  ? Report Status PENDING  Incomplete  ?Culture, Blood     Status: None (Preliminary result)  ? Collection Time: 06/05/21  3:24 PM  ? Specimen: Right Antecubital; Blood  ?Result Value Ref  Range Status  ? Specimen Description   Final  ?  RIGHT ANTECUBITAL ?Performed at [facility], [address], [place], [place] [address] ?  ? Special Requests   Final  ?  BOTTLES DRAWN A

## 2021-06-08 DIAGNOSIS — D6481 Anemia due to antineoplastic chemotherapy: Secondary | ICD-10-CM | POA: Diagnosis not present

## 2021-06-08 DIAGNOSIS — C787 Secondary malignant neoplasm of liver and intrahepatic bile duct: Secondary | ICD-10-CM | POA: Diagnosis not present

## 2021-06-08 DIAGNOSIS — D696 Thrombocytopenia, unspecified: Secondary | ICD-10-CM | POA: Diagnosis not present

## 2021-06-08 DIAGNOSIS — E119 Type 2 diabetes mellitus without complications: Secondary | ICD-10-CM | POA: Diagnosis not present

## 2021-06-08 DIAGNOSIS — Z794 Long term (current) use of insulin: Secondary | ICD-10-CM | POA: Diagnosis not present

## 2021-06-08 DIAGNOSIS — E876 Hypokalemia: Secondary | ICD-10-CM | POA: Diagnosis not present

## 2021-06-08 DIAGNOSIS — Z79899 Other long term (current) drug therapy: Secondary | ICD-10-CM | POA: Diagnosis not present

## 2021-06-08 DIAGNOSIS — R6510 Systemic inflammatory response syndrome (SIRS) of non-infectious origin without acute organ dysfunction: Secondary | ICD-10-CM | POA: Diagnosis not present

## 2021-06-08 DIAGNOSIS — K219 Gastro-esophageal reflux disease without esophagitis: Secondary | ICD-10-CM | POA: Diagnosis not present

## 2021-06-08 DIAGNOSIS — C189 Malignant neoplasm of colon, unspecified: Secondary | ICD-10-CM | POA: Diagnosis not present

## 2021-06-08 DIAGNOSIS — R509 Fever, unspecified: Secondary | ICD-10-CM | POA: Diagnosis not present

## 2021-06-08 DIAGNOSIS — G40909 Epilepsy, unspecified, not intractable, without status epilepticus: Secondary | ICD-10-CM | POA: Diagnosis not present

## 2021-06-08 DIAGNOSIS — Z20822 Contact with and (suspected) exposure to covid-19: Secondary | ICD-10-CM | POA: Diagnosis not present

## 2021-06-08 LAB — CBC
HCT: 22.5 % — ABNORMAL LOW (ref 36.0–46.0)
Hemoglobin: 6.9 g/dL — CL (ref 12.0–15.0)
MCH: 25.2 pg — ABNORMAL LOW (ref 26.0–34.0)
MCHC: 30.7 g/dL (ref 30.0–36.0)
MCV: 82.1 fL (ref 80.0–100.0)
Platelets: 127 10*3/uL — ABNORMAL LOW (ref 150–400)
RBC: 2.74 MIL/uL — ABNORMAL LOW (ref 3.87–5.11)
RDW: 19.9 % — ABNORMAL HIGH (ref 11.5–15.5)
WBC: 2.2 10*3/uL — ABNORMAL LOW (ref 4.0–10.5)
nRBC: 0 % (ref 0.0–0.2)

## 2021-06-08 LAB — BASIC METABOLIC PANEL
Anion gap: 4 — ABNORMAL LOW (ref 5–15)
BUN: 11 mg/dL (ref 6–20)
CO2: 25 mmol/L (ref 22–32)
Calcium: 8.1 mg/dL — ABNORMAL LOW (ref 8.9–10.3)
Chloride: 104 mmol/L (ref 98–111)
Creatinine, Ser: 0.54 mg/dL (ref 0.44–1.00)
GFR, Estimated: >60 mL/min (ref >60)
Glucose, Bld: 246 mg/dL — ABNORMAL HIGH (ref 70–99)
Potassium: 3.6 mmol/L (ref 3.5–5.1)
Sodium: 133 mmol/L — ABNORMAL LOW (ref 135–145)

## 2021-06-08 LAB — GLUCOSE, CAPILLARY
Glucose-Capillary: 194 mg/dL — ABNORMAL HIGH (ref 70–99)
Glucose-Capillary: 180 mg/dL — ABNORMAL HIGH (ref 70–99)

## 2021-06-08 LAB — PROCALCITONIN: Procalcitonin: 2.75 ng/mL

## 2021-06-08 LAB — HEMOGLOBIN AND HEMATOCRIT, BLOOD
HCT: 18.4 % — ABNORMAL LOW (ref 36.0–46.0)
Hemoglobin: 5.8 g/dL — CL (ref 12.0–15.0)

## 2021-06-08 LAB — HEMOGLOBIN A1C
Hgb A1c MFr Bld: 7.2 % — ABNORMAL HIGH (ref 4.8–5.6)
Mean Plasma Glucose: 159.94 mg/dL

## 2021-06-08 LAB — MAGNESIUM: Magnesium: 1.9 mg/dL (ref 1.7–2.4)

## 2021-06-08 LAB — PREPARE RBC (CROSSMATCH)

## 2021-06-08 MED ORDER — SODIUM CHLORIDE 0.9% IV SOLUTION: Freq: Once | INTRAVENOUS | Status: AC

## 2021-06-08 MED ORDER — HEPARIN SOD (PORK) LOCK FLUSH 100 UNIT/ML IV SOLN
500.0000 [IU] | Freq: Once | INTRAVENOUS | Status: AC
  Administered 2021-06-08: 500 [IU] via INTRAVENOUS
  Filled 2021-06-08: qty 5

## 2021-06-08 NOTE — Discharge Summary (Signed)
Physician Discharge Summary  ?[name] GYI:[id number] DOB: 1976-01-01 DOA: 06/06/2021 ? ?PCP: [name], Raymond J, MD ? ?Admit date: 06/06/2021 ?Discharge date: 06/08/2021 ? ?Admitted From: Home ?Disposition: Home ? ?Recommendations for Outpatient Follow-up:  ?Follow up with PCP in 1 week ?Follow up with Dr. Benay Spice, oncology ? ?Discharge Condition: Stable ?CODE STATUS: Full code ?Diet recommendation: Carb modified diet ? ?Brief/Interim Summary: ?[name] is a 46 y.o. female with medical history significant of ADD, anxiety, depression, diabetes mellitus type 2, GERD, metastatic adenocarcinoma of the ovary, seizures, and more presents ED with a chief complaint of fever.   ?  ?Of note patient had a very similar admission last month where she had a fever after chemo.  Dr. Delton Coombes was consulted at that time, and determined that after reviewing cultures and work-up, the fever was likely from chemo.  Patient had her last chemo on Monday, May 1.  They had to stop the chemo early because patient was having a reaction.   ?  ?She reports she had chills but no fever during chemo.  They gave her Benadryl and something else that she does not remember and she left her home.  When she left her home she does report that her temperature had increased to 101 but she felt fine when she got home.  She did not have any body aches or infectious symptoms.  Throughout the day she became more more feverish until her temperature became 103.  This was despite taking Tylenol and ibuprofen.  The patient came into the ED.   ?  ?Patient underwent fever work-up and was started on broad-spectrum antibiotics due to her immune suppressed status.  Blood cultures and urine culture as well as chest x-ray were negative.  No known source of fever or infection was found.  Antibiotics were discontinued.  This could have been secondary to chemotherapy similar in presentation in March.  Patient also had symptomatic anemia without overt source of blood  loss.  She was given 2 unit packed red blood cell transfusions prior to discharge home. ? ?Discharge Diagnoses:  ? ?Principal Problem: ?  SIRS (systemic inflammatory response syndrome) (HCC) ?Active Problems: ?  Primary malignant neoplasm of colon with metastasis in female [facility]) ?  Hypokalemia ?  GERD ?  Anemia due to antineoplastic chemotherapy ?  Chemotherapy-induced thrombocytopenia ?  Hypomagnesemia ?  Seizure ([place]) ?  Diabetes mellitus, type 2 ([place]) ? ? ?SIRS ?-Without known source of fevers or infection ?-Blood cultures negative to date ?-UA negative, urine culture negative ?-Influenza, COVID-negative ?-Thought to be secondary to chemotherapy ? ?Symptomatic anemia ?-Anemia in setting of chemotherapy.  Patient voices no overt blood loss, denies any hematochezia, hematemesis, vaginal bleeding ?-Transfused 2 unit packed red blood cells ? ?Metastatic colon cancer ?-Followed by Dr. Benay Spice ?  ?Diabetes mellitus type 2, well controlled ?-A1c 7.2 ?-SSI ? ?Seizure disorder ?-Continue Vimpat ?   ?GERD ?-PPI ?  ?Anxiety ?-Ativan, hydroxyzine prn  ? ?Discharge Instructions ? ?Discharge Instructions   ? ? Call MD for:  difficulty breathing, headache or visual disturbances   Complete by: As directed ?  ? Call MD for:  extreme fatigue   Complete by: As directed ?  ? Call MD for:  persistant dizziness or light-headedness   Complete by: As directed ?  ? Call MD for:  persistant nausea and vomiting   Complete by: As directed ?  ? Call MD for:  severe uncontrolled pain   Complete by: As directed ?  ?  Call MD for:  temperature >100.4   Complete by: As directed ?  ? Discharge instructions   Complete by: As directed ?  ? You were cared for by a hospitalist during your hospital stay. If you have any questions about your discharge medications or the care you received while you were in the hospital after you are discharged, you can call the unit and ask to speak with the hospitalist on call if the hospitalist that took care of you  is not available. Once you are discharged, your primary care physician will handle any further medical issues. Please note that NO REFILLS for any discharge medications will be authorized once you are discharged, as it is imperative that you return to your primary care physician (or establish a relationship with a primary care physician if you do not have one) for your aftercare needs so that they can reassess your need for medications and monitor your lab values.  ? Increase activity slowly   Complete by: As directed ?  ? ?  ? ?Allergies as of 06/08/2021   ? ?   Reactions  ? Adhesive [tape] Hives, Shortness Of Breath, Swelling  ? Tolerates paper tape  ? Clindamycin/lincomycin Itching, Rash, Other (See Comments)  ? Generalized body rash with itching, lip ulcers, seizures  ? Promethazine Other (See Comments)  ? Increase restless leg movement   ? Eggs Or Egg-derived Products Nausea And Vomiting  ? Leucovorin Calcium Other (See Comments)  ? Severe chills, fever (See progress note from 06/05/21)  ? Metoclopramide Hcl Other (See Comments)  ? EXACERBATES RESTLESS LEG SYNDROME  ? Oxaliplatin Other (See Comments)  ? Severe chills, fever (See progress note from 06/05/21)  ? Oxycodone-acetaminophen Hives  ? Takes OXIR at home  ? Sulfa Antibiotics Nausea And Vomiting  ? Fluogen [influenza Virus Vaccine] Nausea And Vomiting  ? Levaquin [levofloxacin]   ? seizures  ? Penicillins Itching  ? Has patient had a PCN reaction causing immediate rash, facial/tongue/throat swelling, SOB or lightheadedness with hypotension: No ?Has patient had a PCN reaction causing severe rash involving mucus membranes or skin necrosis: No ?Has patient had a PCN reaction that required hospitalization No ?Has patient had a PCN reaction occurring within the last 10 years: No ?If all of the above answers are "NO", then may proceed with Cephalosporin use.  ? ?  ? ?  ?Medication List  ?  ? ?STOP taking these medications   ? ?diphenhydrAMINE 25 MG tablet ?Commonly  known as: BENADRYL ?  ?oxyCODONE 5 MG immediate release tablet ?Commonly known as: Roxicodone ?  ?simethicone 80 MG chewable tablet ?Commonly known as: MYLICON ?  ? ?  ? ?TAKE these medications   ? ?Accu-Chek Softclix Lancets lancets ?SMARTSIG:2 Topical Twice Daily ?  ?acetaminophen 500 MG tablet ?Commonly known as: TYLENOL ?Take 1,000 mg by mouth every 6 (six) hours as needed for mild pain. ?  ?blood glucose meter kit and supplies ?Dispense based on patient and insurance preference. Use up to four times daily as directed. (FOR ICD-10 E10.9, E11.9). ?  ?docusate sodium 100 MG capsule ?Commonly known as: COLACE ?Take 1 capsule (100 mg total) by mouth every 12 (twelve) hours. ?What changed:  ?when to take this ?reasons to take this ?  ?furosemide 20 MG tablet ?Commonly known as: LASIX ?Take 1 tablet (20 mg total) by mouth daily. ?  ?hydrOXYzine 25 MG tablet ?Commonly known as: ATARAX ?Take 1 tablet (25 mg total) by mouth at bedtime. ?  ?insulin NPH  Human 100 UNIT/ML injection ?Commonly known as: NovoLIN N ?Inject 0.1 mLs (10 Units total) into the skin at bedtime. ?  ?lacosamide 200 MG Tabs tablet ?Commonly known as: VIMPAT ?Take 1 tablet by mouth twice daily ?  ?loperamide 2 MG capsule ?Commonly known as: IMODIUM ?Take 2 mg by mouth as needed for diarrhea or loose stools. ?  ?LORazepam 0.5 MG tablet ?Commonly known as: ATIVAN ?Take 1 tablet by mouth twice daily as needed for anxiety ?  ?magic mouthwash Soln ?Take 5 mLs by mouth 4 (four) times daily. ?What changed:  ?when to take this ?reasons to take this ?  ?magnesium oxide 400 (240 Mg) MG tablet ?Commonly known as: MAG-OX ?Take 1 tablet (400 mg total) by mouth 2 (two) times daily. ?  ?metFORMIN 500 MG tablet ?Commonly known as: GLUCOPHAGE ?Take 1 tablet (500 mg total) by mouth 2 (two) times daily with a meal. ?  ?pantoprazole 40 MG tablet ?Commonly known as: Protonix ?Take 1 tablet (40 mg total) by mouth 2 (two) times daily. ?  ?potassium chloride 10 MEQ CR  capsule ?Commonly known as: MICRO-K ?Take 1 capsule (10 mEq total) by mouth daily. ?  ?prochlorperazine 10 MG tablet ?Commonly known as: COMPAZINE ?Take 1 tablet (10 mg total) by mouth every 6 (six) hours as n

## 2021-06-08 NOTE — Patient Outreach (Signed)
[facility]) Care Management ? ?06/08/2021 ? ?KATRIONA SCHMIERER ?29-Oct-1975 ?[id number] ? ? ?Received hospital referral From Natividad Brood, RN for [place] Coordinator for post hospital follow up call and assess for further needs. Assigned to Deloria Lair, RN for follow up. ? ?Philmore Pali ?[place] Management Assistant ?[phone] ? ?

## 2021-06-08 NOTE — Progress Notes (Signed)
Blood consent signed

## 2021-06-08 NOTE — Progress Notes (Signed)
Date and time results received: 06/08/21 0730 ?(use smartphrase ".now" to insert current time) ? ?Test: HGB ?Critical Value: 5.8 ? ?Name of Provider Notified: Dessa Phi DO ? ?Orders Received? Or Actions Taken?: 2units of PRBC ordered  ? ?

## 2021-06-08 NOTE — Consult Note (Signed)
[facility] CM Inpatient Consult ? ? ?06/08/2021 ? ?[name] ?10-Sep-1975 ?[id number] ? ?[facility] CM) ?  ?Patient chart reviewed with noted extreme high risk score for unplanned readmission. Assessed for post hospital chronic disease management and care coordination needs. ? ?Plan: Will continue to follow for progression and disposition plans. ? ?Of note, [facility] Care Management services does not replace or interfere with any services that are arranged by inpatient case management or social work.  ? ?Netta Cedars, MSN, RN ?[facility] Liaison ?Toll free office [phone] ?

## 2021-06-08 NOTE — Progress Notes (Signed)
Nsg Discharge Note ? ?Admit Date:  06/06/2021 ?Discharge date: 06/08/2021 ?  ?[name] to be D/C'd Home per MD order.  AVS completed.  Copy for chart, and copy for patient signed, and dated. ?Patient/caregiver able to verbalize understanding. ? ?Discharge Medication: ?Allergies as of 06/08/2021   ? ?   Reactions  ? Adhesive [tape] Hives, Shortness Of Breath, Swelling  ? Tolerates paper tape  ? Clindamycin/lincomycin Itching, Rash, Other (See Comments)  ? Generalized body rash with itching, lip ulcers, seizures  ? Promethazine Other (See Comments)  ? Increase restless leg movement   ? Eggs Or Egg-derived Products Nausea And Vomiting  ? Leucovorin Calcium Other (See Comments)  ? Severe chills, fever (See progress note from 06/05/21)  ? Metoclopramide Hcl Other (See Comments)  ? EXACERBATES RESTLESS LEG SYNDROME  ? Oxaliplatin Other (See Comments)  ? Severe chills, fever (See progress note from 06/05/21)  ? Oxycodone-acetaminophen Hives  ? Takes OXIR at home  ? Sulfa Antibiotics Nausea And Vomiting  ? Fluogen [influenza Virus Vaccine] Nausea And Vomiting  ? Levaquin [levofloxacin]   ? seizures  ? Penicillins Itching  ? Has patient had a PCN reaction causing immediate rash, facial/tongue/throat swelling, SOB or lightheadedness with hypotension: No ?Has patient had a PCN reaction causing severe rash involving mucus membranes or skin necrosis: No ?Has patient had a PCN reaction that required hospitalization No ?Has patient had a PCN reaction occurring within the last 10 years: No ?If all of the above answers are "NO", then may proceed with Cephalosporin use.  ? ?  ? ?  ?Medication List  ?  ? ?STOP taking these medications   ? ?diphenhydrAMINE 25 MG tablet ?Commonly known as: BENADRYL ?  ?oxyCODONE 5 MG immediate release tablet ?Commonly known as: Roxicodone ?  ?simethicone 80 MG chewable tablet ?Commonly known as: MYLICON ?  ? ?  ? ?TAKE these medications   ? ?Accu-Chek Softclix Lancets lancets ?SMARTSIG:2 Topical Twice  Daily ?  ?acetaminophen 500 MG tablet ?Commonly known as: TYLENOL ?Take 1,000 mg by mouth every 6 (six) hours as needed for mild pain. ?  ?blood glucose meter kit and supplies ?Dispense based on patient and insurance preference. Use up to four times daily as directed. (FOR ICD-10 E10.9, E11.9). ?  ?docusate sodium 100 MG capsule ?Commonly known as: COLACE ?Take 1 capsule (100 mg total) by mouth every 12 (twelve) hours. ?What changed:  ?when to take this ?reasons to take this ?  ?furosemide 20 MG tablet ?Commonly known as: LASIX ?Take 1 tablet (20 mg total) by mouth daily. ?  ?hydrOXYzine 25 MG tablet ?Commonly known as: ATARAX ?Take 1 tablet (25 mg total) by mouth at bedtime. ?  ?insulin NPH Human 100 UNIT/ML injection ?Commonly known as: NovoLIN N ?Inject 0.1 mLs (10 Units total) into the skin at bedtime. ?  ?lacosamide 200 MG Tabs tablet ?Commonly known as: VIMPAT ?Take 1 tablet by mouth twice daily ?  ?loperamide 2 MG capsule ?Commonly known as: IMODIUM ?Take 2 mg by mouth as needed for diarrhea or loose stools. ?  ?LORazepam 0.5 MG tablet ?Commonly known as: ATIVAN ?Take 1 tablet by mouth twice daily as needed for anxiety ?  ?magic mouthwash Soln ?Take 5 mLs by mouth 4 (four) times daily. ?What changed:  ?when to take this ?reasons to take this ?  ?magnesium oxide 400 (240 Mg) MG tablet ?Commonly known as: MAG-OX ?Take 1 tablet (400 mg total) by mouth 2 (two) times daily. ?  ?metFORMIN  500 MG tablet ?Commonly known as: GLUCOPHAGE ?Take 1 tablet (500 mg total) by mouth 2 (two) times daily with a meal. ?  ?pantoprazole 40 MG tablet ?Commonly known as: Protonix ?Take 1 tablet (40 mg total) by mouth 2 (two) times daily. ?  ?potassium chloride 10 MEQ CR capsule ?Commonly known as: MICRO-K ?Take 1 capsule (10 mEq total) by mouth daily. ?  ?prochlorperazine 10 MG tablet ?Commonly known as: COMPAZINE ?Take 1 tablet (10 mg total) by mouth every 6 (six) hours as needed for nausea or vomiting. ?  ?spironolactone 50 MG  tablet ?Commonly known as: Aldactone ?Take 1 tablet (50 mg total) by mouth daily. ?  ? ?  ? ? ?Discharge Assessment: ?Vitals:  ? 06/08/21 1155 06/08/21 1430  ?BP: 113/86 124/84  ?Pulse: 88 88  ?Resp: 18 20  ?Temp: 98 ?F (36.7 ?C) 98.5 ?F (36.9 ?C)  ?SpO2:    ? Skin clean, dry and intact without evidence of skin break down, no evidence of skin tears noted. ?IV catheter discontinued intact. Site without signs and symptoms of complications - no redness or edema noted at insertion site, patient denies c/o pain - only slight tenderness at site.  Dressing with slight pressure applied. ? ?D/c Instructions-Education: ?Discharge instructions given to patient/family with verbalized understanding. ?D/c education completed with patient/family including follow up instructions, medication list, d/c activities limitations if indicated, with other d/c instructions as indicated by MD - patient able to verbalize understanding, all questions fully answered. ?Patient instructed to return to ED, call 911, or call MD for any changes in condition.  ?Patient escorted via [place], and D/C home via private auto. ? ?Clovis Fredrickson, LPN ?07/12/2092 7:09 PM ?

## 2021-06-08 NOTE — TOC Transition Note (Signed)
Transition of Care (TOC) - CM/SW Discharge Note ? ? ?Patient Details  ?Name: [name] ?MRN: [id number] ?Date of Birth: 05/19/1975 ? ?Transition of Care (TOC) CM/SW Contact:  ?Shade Flood, LCSW ?Phone Number: ?06/08/2021, 11:15 AM ? ? ?Clinical Narrative:    ? ?Per MD, pt may dc home later today after her transfusion. Pt does not have any TOC needs for dc. ? ?Final next level of care: Home/Self Care ?Barriers to Discharge: Barriers Resolved ? ? ?Patient Goals and CMS Choice ?Patient states their goals for this hospitalization and ongoing recovery are:: go home ?  ?  ? ?Discharge Placement ?  ?           ?  ?  ?  ?  ? ?Discharge Plan and Services ?In-house Referral: Clinical Social Work ?  ?           ?  ?  ?  ?  ?  ?  ?  ?  ?  ?  ? ?Social Determinants of Health (SDOH) Interventions ?  ? ? ?Readmission Risk Interventions ? ?  06/06/2021  ?  1:40 PM  ?Readmission Risk Prevention Plan  ?Transportation Screening Complete  ?Medication Review Press photographer) Complete  ?[place] or Home Care Consult Complete  ?SW Recovery Care/Counseling Consult Complete  ?Palliative Care Screening Not Applicable  ?[place] Not Applicable  ? ? ? ? ? ?

## 2021-06-08 NOTE — Progress Notes (Signed)
First unit of blood started. Vitals obtained before and 15 minutes after administrating. PT educated on S/S of reaction. After the 15 minute mark pt denied any S/S. Pts bed was changed  and CHG bath given.  ?

## 2021-06-08 NOTE — Progress Notes (Signed)
Port was de-accessed by [place], LPN and Research scientist (medical) and was Heparin flushed.  ?

## 2021-06-09 LAB — TYPE AND SCREEN
ABO/RH(D): A POS
Antibody Screen: NEGATIVE
Unit division: 0
Unit division: 0

## 2021-06-09 LAB — BPAM RBC
Blood Product Expiration Date: 202305312359
Blood Product Expiration Date: 202306032359
ISSUE DATE / TIME: 202305040850
ISSUE DATE / TIME: 202305041134
Unit Type and Rh: 600
Unit Type and Rh: 6200

## 2021-06-10 LAB — CULTURE, BLOOD (SINGLE)
Culture: NO GROWTH
Culture: NO GROWTH

## 2021-06-11 LAB — CULTURE, BLOOD (ROUTINE X 2)
Culture: NO GROWTH
Culture: NO GROWTH
Special Requests: ADEQUATE
Special Requests: ADEQUATE

## 2021-06-12 ENCOUNTER — Telehealth: Payer: Self-pay | Admitting: Oncology

## 2021-06-12 ENCOUNTER — Ambulatory Visit (HOSPITAL_BASED_OUTPATIENT_CLINIC_OR_DEPARTMENT_OTHER): Payer: BC Managed Care – PPO | Admitting: Family Medicine

## 2021-06-12 ENCOUNTER — Encounter: Payer: Self-pay | Admitting: Oncology

## 2021-06-12 ENCOUNTER — Telehealth: Payer: Self-pay | Admitting: *Deleted

## 2021-06-12 ENCOUNTER — Encounter: Payer: Self-pay | Admitting: *Deleted

## 2021-06-12 NOTE — Telephone Encounter (Signed)
Per NP: Ensure she has no blisters or rash in mouth or palms. Could be due to all antibiotics she was on in hospital. Juvia reports no rash on hands or mouth. ?Will offer lab tomorrow to see if she has lost enough blood to transfuse. Can check lab prior to EGD tomorrow, which is being done due to all her abdominal pain and reflux. She agrees to lab draw tomorrow.  ?Informed her to keep feet elevated as much as possible and continue her Lasix and Spironolactone. Anemia can also impact the swelling in legs feet as well as abdominal pressure impedes return blood flow up the legs. ?

## 2021-06-12 NOTE — Telephone Encounter (Signed)
Attempted to contact patient to advise about scheduled appt for tomorrow. No answer and voicemail was full so unable to leave voicemail ?

## 2021-06-12 NOTE — Telephone Encounter (Signed)
[name] was contacted by telephone to verify understanding of discharge instructions status post their most recent discharge from the hospital on the date:  06/08/21.  Inpatient discharge AVS was re-reviewed with patient, along with cancer center appointments.  Verification of understanding for oncology specific follow-up was validated using the Teach Back method.   ? ?Transportation to appointments were confirmed for the patient as being self/caregiver. ? ?She reports rash on her anterior and posterior trunk that started on Saturday and beginning to spread to legs--it is red, slightly raised and itches. Taking Benadryl prn and Calamine Lotion w/some relief of itching. Denies any new meds. Had blood transfusion on 06/08/21 before leaving hospital. Also reports some bright red blood and then darker when she has BM. No clots noted and she is not lightheaded or dizzy. Is having EGD tomorrow at [facility]. Also reports her BLE edema (pitting) has increased since leaving hospital. Trying to keep feet elevated as much as possible.  ? ? ?[name]?s questions were addressed to their satisfaction upon completion of this post discharge follow-up call for outpatient oncology.  ?

## 2021-06-13 ENCOUNTER — Encounter: Payer: Self-pay | Admitting: Internal Medicine

## 2021-06-13 ENCOUNTER — Inpatient Hospital Stay: Payer: BC Managed Care – PPO

## 2021-06-13 ENCOUNTER — Other Ambulatory Visit: Payer: Self-pay | Admitting: *Deleted

## 2021-06-13 ENCOUNTER — Ambulatory Visit (AMBULATORY_SURGERY_CENTER): Payer: BC Managed Care – PPO | Admitting: Internal Medicine

## 2021-06-13 VITALS — BP 115/77 | HR 82 | Temp 97.3°F | Resp 19 | Ht 64.0 in | Wt 159.0 lb

## 2021-06-13 DIAGNOSIS — C189 Malignant neoplasm of colon, unspecified: Secondary | ICD-10-CM

## 2021-06-13 DIAGNOSIS — R162 Hepatomegaly with splenomegaly, not elsewhere classified: Secondary | ICD-10-CM | POA: Diagnosis not present

## 2021-06-13 DIAGNOSIS — R1011 Right upper quadrant pain: Secondary | ICD-10-CM

## 2021-06-13 DIAGNOSIS — I8500 Esophageal varices without bleeding: Secondary | ICD-10-CM

## 2021-06-13 DIAGNOSIS — R109 Unspecified abdominal pain: Secondary | ICD-10-CM | POA: Diagnosis not present

## 2021-06-13 DIAGNOSIS — Z85038 Personal history of other malignant neoplasm of large intestine: Secondary | ICD-10-CM

## 2021-06-13 DIAGNOSIS — R21 Rash and other nonspecific skin eruption: Secondary | ICD-10-CM | POA: Diagnosis not present

## 2021-06-13 DIAGNOSIS — K766 Portal hypertension: Secondary | ICD-10-CM | POA: Diagnosis not present

## 2021-06-13 DIAGNOSIS — K3189 Other diseases of stomach and duodenum: Secondary | ICD-10-CM

## 2021-06-13 DIAGNOSIS — D509 Iron deficiency anemia, unspecified: Secondary | ICD-10-CM | POA: Diagnosis not present

## 2021-06-13 DIAGNOSIS — Z79899 Other long term (current) drug therapy: Secondary | ICD-10-CM | POA: Diagnosis not present

## 2021-06-13 DIAGNOSIS — C787 Secondary malignant neoplasm of liver and intrahepatic bile duct: Secondary | ICD-10-CM | POA: Diagnosis not present

## 2021-06-13 DIAGNOSIS — C182 Malignant neoplasm of ascending colon: Secondary | ICD-10-CM | POA: Diagnosis not present

## 2021-06-13 DIAGNOSIS — Z452 Encounter for adjustment and management of vascular access device: Secondary | ICD-10-CM | POA: Diagnosis not present

## 2021-06-13 DIAGNOSIS — R188 Other ascites: Secondary | ICD-10-CM | POA: Diagnosis not present

## 2021-06-13 LAB — CBC WITH DIFFERENTIAL (CANCER CENTER ONLY)
Abs Immature Granulocytes: 0.02 10*3/uL (ref 0.00–0.07)
Basophils Absolute: 0 10*3/uL (ref 0.0–0.1)
Basophils Relative: 0 %
Eosinophils Absolute: 0.1 10*3/uL (ref 0.0–0.5)
Eosinophils Relative: 2 %
HCT: 34.3 % — ABNORMAL LOW (ref 36.0–46.0)
Hemoglobin: 10.6 g/dL — ABNORMAL LOW (ref 12.0–15.0)
Immature Granulocytes: 1 %
Lymphocytes Relative: 29 %
Lymphs Abs: 0.8 10*3/uL (ref 0.7–4.0)
MCH: 26.2 pg (ref 26.0–34.0)
MCHC: 30.9 g/dL (ref 30.0–36.0)
MCV: 84.9 fL (ref 80.0–100.0)
Monocytes Absolute: 0.3 10*3/uL (ref 0.1–1.0)
Monocytes Relative: 9 %
Neutro Abs: 1.7 10*3/uL (ref 1.7–7.7)
Neutrophils Relative %: 59 %
Platelet Count: 160 10*3/uL (ref 150–400)
RBC: 4.04 MIL/uL (ref 3.87–5.11)
RDW: 19.4 % — ABNORMAL HIGH (ref 11.5–15.5)
WBC Count: 2.9 10*3/uL — ABNORMAL LOW (ref 4.0–10.5)
nRBC: 0 % (ref 0.0–0.2)

## 2021-06-13 MED ORDER — SODIUM CHLORIDE 0.9 % IV SOLN: 500.0000 mL | Freq: Once | INTRAVENOUS | Status: DC

## 2021-06-13 NOTE — Progress Notes (Signed)
A and O x3. Report to RN. Tolerated MAC anesthesia well. Teeth unchanged after procedure.  ?

## 2021-06-13 NOTE — Progress Notes (Signed)
06/01/2021 ?[name] ?[id number] ?1975-08-01 ?  ?Referring provider: [name], [name], MD ?Primary GI doctor: [name] ?  ?ASSESSMENT AND PLAN:  ?  ?History of colon cancer ?Metastatic colon cancer to liver [facility]) ?Follows with Dr. Benay Spice in oncology. ?  ?RUQ abdominal pain ?-     Ambulatory referral to Gastroenterology ?-Worse postprandial, is having reflux worse at night. ?-We will schedule HIDA to evaluate gallstones see if this is potentially contributing, unknown if patient surgical candidate slightly doubtful but could do trial of ursodiol  ?-Currently no evidence of obstruction with liver functions and exam, discussed with patient and mother symptoms to go to the ER ?We will start pantoprazole before food continue twice daily ?-With worsening reflux and nonresponsive to pantoprazole with nausea, AB pain we will also schedule for endoscopy to evaluate further. ?Patient and mother agree with this plan. ?If everything is negative very possible it may be from liver mets.  ?  ?Other ascites ?Has had paracentesis negative for malignant cells, negative for SBP.  Has not had LDH, proteins, glucose, other labs to evaluate origin ?Just started on Lasix and spironolactone, monitor for response. ?  ?Splenomegaly ?-     Normal liver function test, platelets are normal at this time, no evidence of portal hypertension. ?-Question with ascites and splenomegaly with active cancer and chemotherapy if patient needs CT angio versus ultrasound. ?Patient does deny any symptoms of unilateral leg swelling, chest pain, palpitations.  Does have some minor shortness of breath. ?-CT with contrast have not shown any thrombosis ?  ?Nausea and vomiting, unspecified vomiting type ?-     NM Hepato W/EF; Future ?-     Ambulatory referral to Gastroenterology ?Will schedule HIDA and EGD ?  ?Patient Care Team: ?[name], [name], MD as PCP - General (Family Medicine) ?[name], MD as Consulting Physician (Oncology) ?[name], DO as Consulting Physician (Neurology) ?  ?HISTORY OF PRESENT ILLNESS: ?46 y.o. female with a past medical history of colon cancer status post right hemicolectomy 2015, status postchemotherapy, recurrence right ovary status post right salpingo-oophorectomy 12/2014, liver metastasis 2022 status post Y90 treatment history of negative genetic studies 2015 followed with Dr. Benay Spice,  and others listed below presents for evaluation of abdominal pain and ascites. ?  ?Small review of history: ?Patient was last seen by [name] 11/2018 after cystoscopy reduction including omentectomy, LAR, R salpingo-oophorectomy, and L colonic gutter/pelvic stripping. R1 resection achieved.  ?11/17/2018 colonoscopy with flex sig per rectum with changes of mild diversion colitis necrotic appearing debris colostomy bag insufflated suggesting some type of communication between pouch and proximal colon. ?Normal-appearing colonic mucosa to the level of previous hemicolectomy ileocolonic anastomosis, excluded distal pouch with similar necrotic appearing debris. ?  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  No malignancy. ?  ?CTs 03/16/2021-multiple new small pulmonary nodules.  Response to therapy in the liver, decreasing size of liver lesions with signs of interval radioembolization; findings suspicious for perianastomotic recurrence in the pelvis; new small volume ascites and some right colonic thickening as well as retroperitoneal thickening. ?  ?Admission 04/24/2021 for fever of unknown origin. ?  ?CT abdomen/pelvis 05/06/2021-increased ascites, stable hypodense liver lesions, splenomegaly, soft tissue density at the left pelvis-stable or decreased in size by my  measurement ?05/15/2021 paracentesis 1.7  L fluid removed no malignant cells, no SBP.  No other labs done.  Patient does have history of splenomegaly.  No SAAG alible. ?No improvement of abdominal distention post paracentesis, persistent lower extremity edema, persistent intermittent right upper abdominal pain after eating. ?Most recently saw Ned Card at oncology 05/22/2021 ?Started last visit on diuretics that visit lasix 20 and spirolactone 50 mg. . ?Had some rectal bleeding after taking ibuprofen ?Patient has leukopenia and anemia, platelets were normal 188. ?She has some SOB with getting ready, no palpitations, no chest pain.  ?She is not on blood thinner. ?  ?Patient presents with RUQ ab pain, worse after eating, anything.  ?She continues to have GERD, can wake her up at night. She is on PPI BID without help. Burns into her throat, will sit up will beltch. She has nausea but no vomiting.  ?Last LFTs were unremarkable, no evidence of obstruction.  ?Patient has had gallstones ?2015 normal EGD, insulin depdendent for 1 month only, only takes tylenol, rare oxycodone.  ?She has tried ibuprofen for 1-2 days, but makes her have BRB.  ?Will eat at 5 or 6 pm, will go to bed at 930.  ?She denies dysphagia, melena ?She denies ETOH use.   ?She reports family history of gallbladder issues with two of her sisters.   ?  ?  ?05/06/2021 CT abdomen pelvis with contrast showed fatty infiltration of the liver, gallbladder stones were seen no ductal dilatation, normal pancreas, enlarged spleen, prominent submucosal folds in the stomach which may be partially due to incomplete distention, no significant wall thickening in the colon increase in size of ascites. ?05/06/2021 right upper quadrant ultrasound for right upper quadrant pain shows 10 mm echogenic structure in the lumen of the gallbladder not distended no tenderness.  No evidence of acute cholecystitis, fatty liver and small ascites. ?AST 24 ALT 12 ?Alkphos 162 TBili  1.1 ?05/06/2021 LIPASE 31 ?  ?  ?Current Medications:  ?  ?Current Outpatient Medications (Endocrine & Metabolic):  ?  insulin NPH Human (NOVOLIN N) 100 UNIT/ML injection, Inject 0.1 mLs (10 Units total) into the skin at bedtime. ?  metFORMIN (GLUCOPHAGE) 500 MG tablet, Take 1 tablet (500 mg total) by mouth 2 (two) times daily with a meal. ?  ?Current Outpatient Medications (Cardiovascular):  ?  furosemide (LASIX) 20 MG tablet, Take 1 tablet (20 mg total) by mouth daily. ?  spironolactone (ALDACTONE) 50 MG tablet, Take 1 tablet (50 mg total) by mouth daily. ?  ?Current Outpatient Medications (Respiratory):  ?  diphenhydrAMINE (BENADRYL) 25 MG tablet, Take 50 mg by mouth at bedtime as needed for itching or sleep. ?  magic mouthwash SOLN*, Take 5 mLs by mouth 4 (four) times daily. ?  ?Current Outpatient Medications (Analgesics):  ?  acetaminophen (TYLENOL) 500 MG tablet, Take 1,000 mg by mouth every 6 (six) hours as needed for mild pain. ?  oxyCODONE (ROXICODONE) 5 MG immediate release tablet, Take 2 tablets (10 mg total) by mouth every 6 (six) hours as needed for severe pain. ?  ?  ?Current Outpatient Medications (Other):  ?  Accu-Chek Softclix Lancets lancets, SMARTSIG:2 Topical Twice Daily ?  blood glucose meter kit and supplies, Dispense based on patient and insurance preference. Use up to four times daily as directed. (FOR ICD-10 E10.9, E11.9). ?  hydrOXYzine (ATARAX) 25 MG tablet, Take 1 tablet (25 mg total) by mouth at bedtime. ?  lacosamide (VIMPAT) 200 MG TABS tablet, Take 1 tablet by mouth twice daily (Patient taking  differently: Take 200 mg by mouth 2 (two) times daily.) ?  loperamide (IMODIUM) 2 MG capsule, Take 2 mg by mouth as needed for diarrhea or loose stools. ?  LORazepam (ATIVAN) 0.5 MG tablet, Take 1 tablet by mouth twice daily as needed for anxiety ?  magic mouthwash SOLN*, Take 5 mLs by mouth 4 (four) times daily. ?  magnesium oxide (MAG-OX) 400 (240 Mg) MG tablet, Take 1 tablet (400 mg total) by  mouth 2 (two) times daily. ?  pantoprazole (PROTONIX) 40 MG tablet, Take 1 tablet (40 mg total) by mouth 2 (two) times daily. ?  potassium chloride (MICRO-K) 10 MEQ CR capsule, Take 1 capsule (10 mEq total) by mou

## 2021-06-13 NOTE — Progress Notes (Signed)
I have reviewed the patient's medical history in detail and updated the computerized patient record.  ? ?Patient states she has "a rash on my upper chest, stomach legs and back from one of 2 antibiotics given at the hospital this past Saturday".  ? ?Vs dt ?

## 2021-06-13 NOTE — Patient Instructions (Addendum)
Keep appointment for HIDA scan. ?Dr. Blanch Media  office nurse will call you with an appointment for Doppler Ultrasound of the liver. ?Your sugar was108  in the recovery room. ?You may resume your current medications today. ?Await biopsy results.  May take 1-3 weeks to receive pathology results. ?Please call if any questions or concerns. ?  ? ? ? ?YOU HAD AN ENDOSCOPIC PROCEDURE TODAY AT [facility]:   Refer to the procedure report that was given to you for any specific questions about what was found during the examination.  If the procedure report does not answer your questions, please call your gastroenterologist to clarify.  If you requested that your care partner not be given the details of your procedure findings, then the procedure report has been included in a sealed envelope for you to review at your convenience later. ? ?YOU SHOULD EXPECT: Some feelings of bloating in the abdomen. Passage of more gas than usual.  Walking can help get rid of the air that was put into your GI tract during the procedure and reduce the bloating. If you had a lower endoscopy (such as a colonoscopy or flexible sigmoidoscopy) you may notice spotting of blood in your stool or on the toilet paper. If you underwent a bowel prep for your procedure, you may not have a normal bowel movement for a few days. ? ?Please Note:  You might notice some irritation and congestion in your nose or some drainage.  This is from the oxygen used during your procedure.  There is no need for concern and it should clear up in a day or so. ? ?SYMPTOMS TO REPORT IMMEDIATELY: ? ?Following upper endoscopy (EGD) ? Vomiting of blood or coffee ground material ? New chest pain or pain under the shoulder blades ? Painful or persistently difficult swallowing ? New shortness of breath ? Fever of 100?F or higher ? Black, tarry-looking stools ? ?For urgent or emergent issues, a gastroenterologist can be reached at any hour by calling [phone]. ?Do  not use MyChart messaging for urgent concerns.  ? ? ?DIET:  We do recommend a small meal at first, but then you may proceed to your regular diet.  Drink plenty of fluids but you should avoid alcoholic beverages for 24 hours. ? ?ACTIVITY:  You should plan to take it easy for the rest of today and you should NOT DRIVE or use heavy machinery until tomorrow (because of the sedation medicines used during the test).   ? ?FOLLOW UP: ?Our staff will call the number listed on your records 48-72 hours following your procedure to check on you and address any questions or concerns that you may have regarding the information given to you following your procedure. If we do not reach you, we will leave a message.  We will attempt to reach you two times.  During this call, we will ask if you have developed any symptoms of COVID 19. If you develop any symptoms (ie: fever, flu-like symptoms, shortness of breath, cough etc.) before then, please call [phone].  If you test positive for Covid 19 in the 2 weeks post procedure, please call and report this information to [place].   ? ?If any biopsies were taken you will be contacted by phone or by letter within the next 1-3 weeks.  Please call us at [phone] if you have not heard about the biopsies in 3 weeks.  ? ? ?SIGNATURES/CONFIDENTIALITY: ?You and/or your care partner have signed paperwork which will  be entered into your electronic medical record.  These signatures attest to the fact that that the information above on your After Visit Summary has been reviewed and is understood.  Full responsibility of the confidentiality of this discharge information lies with you and/or your care-partner.  ?

## 2021-06-13 NOTE — Op Note (Signed)
[place] ?Patient Name: [name] ?Procedure Date: 06/13/2021 3:23 PM ?MRN: [id number] ?Endoscopist: Docia Chuck. Henrene Pastor , MD ?Age: 46 ?Referring MD:  ?Date of Birth: 06-08-1975 ?Gender: Female ?Account #: [id number] ?Procedure:                Upper GI endoscopy ?Indications:              Abdominal pain in the right upper quadrant ?Medicines:                Monitored Anesthesia Care ?Procedure:                Pre-Anesthesia Assessment: ?                          - Prior to the procedure, a History and Physical  ?                          was performed, and patient medications and  ?                          allergies were reviewed. The patient's tolerance of  ?                          previous anesthesia was also reviewed. The risks  ?                          and benefits of the procedure and the sedation  ?                          options and risks were discussed with the patient.  ?                          All questions were answered, and informed consent  ?                          was obtained. Prior Anticoagulants: The patient has  ?                          taken no previous anticoagulant or antiplatelet  ?                          agents. ASA Grade Assessment: III - A patient with  ?                          severe systemic disease. After reviewing the risks  ?                          and benefits, the patient was deemed in  ?                          satisfactory condition to undergo the procedure. ?                          After obtaining informed consent, the endoscope was  ?  passed under direct vision. Throughout the  ?                          procedure, the patient's blood pressure, pulse, and  ?                          oxygen saturations were monitored continuously. The  ?                          Endoscope was introduced through the mouth, and  ?                          advanced to the second part of duodenum. The upper  ?                          GI endoscopy  was accomplished without difficulty.  ?                          The patient tolerated the procedure well. ?Scope In: ?Scope Out: ?Findings:                 The esophagus revealed 4 columns of moderately  ?                          large varices in the distal third of the esophagus.  ?                          No high risk stigmata at this time. ?                          The stomach revealed diffuse portal hypertensive  ?                          gastropathy. Mild contact friability. The stomach  ?                          was otherwise normal. ?                          The examined duodenum was normal. ?                          The cardia and gastric fundus were normal on  ?                          retroflexion. No obvious proximal gastric varices. ?Complications:            No immediate complications. ?Estimated Blood Loss:     Estimated blood loss: none. ?Impression:               1. Esophageal varices ?                          2. Portal hypertensive gastropathy ?  3. Otherwise normal EGD ?                          4. This patient has evidence of portal hypertension  ?                          based on presence of varices, portal hypertensive  ?                          gastropathy, splenomegaly on imaging, elevated  ?                          bilirubin, and cytopenias. Also with ascites  ?                          (malignant versus secondary to portal hypertension). ?                          5. Metastatic cancer ?Recommendation:           - Patient has a contact number available for  ?                          emergencies. The signs and symptoms of potential  ?                          delayed complications were discussed with the  ?                          patient. Return to normal activities tomorrow.  ?                          Written discharge instructions were provided to the  ?                          patient. ?                          - Resume previous diet. ?                           - Continue present medications. ?                          - Keep plans for HIDA scan in a patient with known  ?                          gallstones and persistent postprandial right upper  ?                          quadrant pain ?                          - Please order Doppler ultrasound of the liver.  ?  Rule out portal vein thrombosis ?                          - Resume oncologic care with Dr. Benay Spice ?Docia Chuck. Henrene Pastor, MD ?06/13/2021 4:02:16 PM ?This report has been signed electronically. ?

## 2021-06-13 NOTE — Patient Outreach (Signed)
[facility]) Care Management ? ?06/13/2021 ? ?[name] ?1975-10-01 ?[id number] ? ?Initial telephone outreach for [facility] [facility] services. No answer and pt mailbox was full. Sent a text with NP information and requested a return call. ? ?[name]. Hriday Stai, MSN, GNP-BC ?Gerontological Nurse Practitioner ?[facility] Care Management ?[phone] ? ? ?

## 2021-06-13 NOTE — Progress Notes (Signed)
Pt was instructed that Dr. Blanch Media office nurse will call pt to schedule a doppler ultrasound  of the liver.  To keep the HIDA scan already scheduled.   ?Resume oncology care with Dr. Benay Spice. ? ?No problems noted in the recovery room. maw ?

## 2021-06-14 ENCOUNTER — Telehealth: Payer: Self-pay

## 2021-06-14 ENCOUNTER — Other Ambulatory Visit: Payer: Self-pay | Admitting: *Deleted

## 2021-06-14 ENCOUNTER — Telehealth: Payer: Self-pay | Admitting: *Deleted

## 2021-06-14 DIAGNOSIS — I8500 Esophageal varices without bleeding: Secondary | ICD-10-CM

## 2021-06-14 DIAGNOSIS — C189 Malignant neoplasm of colon, unspecified: Secondary | ICD-10-CM

## 2021-06-14 DIAGNOSIS — R1011 Right upper quadrant pain: Secondary | ICD-10-CM

## 2021-06-14 DIAGNOSIS — R161 Splenomegaly, not elsewhere classified: Secondary | ICD-10-CM

## 2021-06-14 DIAGNOSIS — R188 Other ascites: Secondary | ICD-10-CM

## 2021-06-14 DIAGNOSIS — R112 Nausea with vomiting, unspecified: Secondary | ICD-10-CM

## 2021-06-14 DIAGNOSIS — K3189 Other diseases of stomach and duodenum: Secondary | ICD-10-CM

## 2021-06-14 NOTE — Telephone Encounter (Signed)
Per 06/13/21 procedure report - US liver doppler ? ?US liver doppler order in epic. Secure staff message sent to radiology scheduling to contact pt to set up appt.  ?

## 2021-06-14 NOTE — Telephone Encounter (Signed)
Notified of CBC results. Follow up as scheduled. ?

## 2021-06-14 NOTE — Patient Outreach (Signed)
?  Second telephone outreach unsuccessful. Sent another text requesting a return call. ? ?[name]. Virjean Boman, MSN, GNP-BC ?Gerontological Nurse Practitioner ?[facility] Care Management ?[phone] ? ?

## 2021-06-15 ENCOUNTER — Other Ambulatory Visit: Payer: Self-pay | Admitting: *Deleted

## 2021-06-15 ENCOUNTER — Telehealth: Payer: Self-pay | Admitting: *Deleted

## 2021-06-15 NOTE — Patient Outreach (Signed)
[name] Care Management ? ?06/15/2021 ? ?[name] ?16-Apr-1975 ?[id number] ? ?Third attempt to reach pt but unsuccessful her mailbox is full. Sent a last text and requested a return call. If NP does not get a call back today, will send unsuccessful letter and call again in 2 weeks. ? ?[name]. Letetia Romanello, MSN, GNP-BC ?Gerontological Nurse Practitioner ?[facility] Care Management ?[phone] ? ? ?

## 2021-06-15 NOTE — Telephone Encounter (Signed)
?  Follow up Call- ? ? ?  06/13/2021  ?  2:53 PM 11/17/2018  ?  3:11 PM  ?Call back number  ?Post procedure Call Back phone  # [phone]  ?Permission to leave phone message Yes Yes  ?  ? ?Patient questions: ? ?Do you have a fever, pain , or abdominal swelling? No. ?Pain Score  0 * ? ?Have you tolerated food without any problems? Yes.   ? ?Have you been able to return to your normal activities? Yes.   ? ?Do you have any questions about your discharge instructions: ?Diet   No. ?Medications  No. ?Follow up visit  No. ? ?Do you have questions or concerns about your Care? No. ? ?Actions: ?* If pain score is 4 or above: ?No action needed, pain <4. ? ? ?

## 2021-06-16 ENCOUNTER — Other Ambulatory Visit: Payer: Self-pay

## 2021-06-16 ENCOUNTER — Ambulatory Visit (HOSPITAL_COMMUNITY)
Admission: RE | Admit: 2021-06-16 | Discharge: 2021-06-16 | Disposition: A | Discharge status: Home / Self Care | Payer: BC Managed Care – PPO | Source: Ambulatory Visit | Attending: Physician Assistant | Admitting: Physician Assistant

## 2021-06-16 DIAGNOSIS — R1011 Right upper quadrant pain: Secondary | ICD-10-CM | POA: Diagnosis not present

## 2021-06-16 DIAGNOSIS — R112 Nausea with vomiting, unspecified: Secondary | ICD-10-CM | POA: Diagnosis not present

## 2021-06-16 DIAGNOSIS — C189 Malignant neoplasm of colon, unspecified: Secondary | ICD-10-CM

## 2021-06-16 MED ORDER — TECHNETIUM TC 99M MEBROFENIN IV KIT
7.5000 | PACK | Freq: Once | INTRAVENOUS | Status: AC | PRN
  Administered 2021-06-16: 7.5 via INTRAVENOUS

## 2021-06-16 NOTE — Patient Outreach (Signed)
[facility]) Care Management ? ?06/16/2021 ? ?[name] ?[birth year] ?[id number] ? ?Third outreach call was unsuccessful, sent another text to request a return call. NP will send unsuccessful letter and call again in 2 weeks. ? ?[name]. Pacer Dorn, MSN, GNP-BC ?Gerontological Nurse Practitioner ?[facility] Care Management ?[phone] ? ? ?

## 2021-06-17 ENCOUNTER — Other Ambulatory Visit: Payer: Self-pay | Admitting: Oncology

## 2021-06-21 ENCOUNTER — Inpatient Hospital Stay: Payer: BC Managed Care – PPO

## 2021-06-21 ENCOUNTER — Other Ambulatory Visit (HOSPITAL_COMMUNITY): Payer: Self-pay

## 2021-06-21 ENCOUNTER — Other Ambulatory Visit: Payer: Self-pay | Admitting: Nurse Practitioner

## 2021-06-21 ENCOUNTER — Inpatient Hospital Stay: Payer: BC Managed Care – PPO | Admitting: Nurse Practitioner

## 2021-06-21 ENCOUNTER — Encounter: Payer: Self-pay | Admitting: Nurse Practitioner

## 2021-06-21 ENCOUNTER — Encounter: Payer: Self-pay | Admitting: Internal Medicine

## 2021-06-21 VITALS — BP 117/78 | HR 100 | Temp 98.2°F | Resp 18 | Ht 64.0 in | Wt 145.0 lb

## 2021-06-21 DIAGNOSIS — C182 Malignant neoplasm of ascending colon: Secondary | ICD-10-CM | POA: Diagnosis not present

## 2021-06-21 DIAGNOSIS — C189 Malignant neoplasm of colon, unspecified: Secondary | ICD-10-CM

## 2021-06-21 DIAGNOSIS — Z79899 Other long term (current) drug therapy: Secondary | ICD-10-CM | POA: Diagnosis not present

## 2021-06-21 DIAGNOSIS — R21 Rash and other nonspecific skin eruption: Secondary | ICD-10-CM | POA: Diagnosis not present

## 2021-06-21 DIAGNOSIS — R162 Hepatomegaly with splenomegaly, not elsewhere classified: Secondary | ICD-10-CM | POA: Diagnosis not present

## 2021-06-21 DIAGNOSIS — Z452 Encounter for adjustment and management of vascular access device: Secondary | ICD-10-CM | POA: Diagnosis not present

## 2021-06-21 DIAGNOSIS — C787 Secondary malignant neoplasm of liver and intrahepatic bile duct: Secondary | ICD-10-CM

## 2021-06-21 DIAGNOSIS — D509 Iron deficiency anemia, unspecified: Secondary | ICD-10-CM | POA: Diagnosis not present

## 2021-06-21 DIAGNOSIS — R188 Other ascites: Secondary | ICD-10-CM | POA: Diagnosis not present

## 2021-06-21 DIAGNOSIS — Z95828 Presence of other vascular implants and grafts: Secondary | ICD-10-CM

## 2021-06-21 LAB — CBC WITH DIFFERENTIAL (CANCER CENTER ONLY)
Abs Immature Granulocytes: 0.01 10*3/uL (ref 0.00–0.07)
Basophils Absolute: 0 10*3/uL (ref 0.0–0.1)
Basophils Relative: 1 %
Eosinophils Absolute: 0.1 10*3/uL (ref 0.0–0.5)
Eosinophils Relative: 1 %
HCT: 34.9 % — ABNORMAL LOW (ref 36.0–46.0)
Hemoglobin: 10.9 g/dL — ABNORMAL LOW (ref 12.0–15.0)
Immature Granulocytes: 0 %
Lymphocytes Relative: 24 %
Lymphs Abs: 0.9 10*3/uL (ref 0.7–4.0)
MCH: 26.5 pg (ref 26.0–34.0)
MCHC: 31.2 g/dL (ref 30.0–36.0)
MCV: 84.7 fL (ref 80.0–100.0)
Monocytes Absolute: 0.3 10*3/uL (ref 0.1–1.0)
Monocytes Relative: 9 %
Neutro Abs: 2.5 10*3/uL (ref 1.7–7.7)
Neutrophils Relative %: 65 %
Platelet Count: 201 10*3/uL (ref 150–400)
RBC: 4.12 MIL/uL (ref 3.87–5.11)
RDW: 18.6 % — ABNORMAL HIGH (ref 11.5–15.5)
WBC Count: 3.8 10*3/uL — ABNORMAL LOW (ref 4.0–10.5)
nRBC: 0 % (ref 0.0–0.2)

## 2021-06-21 LAB — CMP (CANCER CENTER ONLY)
ALT: 9 U/L (ref 0–44)
AST: 21 U/L (ref 15–41)
Albumin: 3.1 g/dL — ABNORMAL LOW (ref 3.5–5.0)
Alkaline Phosphatase: 117 U/L (ref 38–126)
Anion gap: 11 (ref 5–15)
BUN: 8 mg/dL (ref 6–20)
CO2: 23 mmol/L (ref 22–32)
Calcium: 9.6 mg/dL (ref 8.9–10.3)
Chloride: 99 mmol/L (ref 98–111)
Creatinine: 0.55 mg/dL (ref 0.44–1.00)
GFR, Estimated: >60 mL/min (ref >60)
Glucose, Bld: 210 mg/dL — ABNORMAL HIGH (ref 70–99)
Potassium: 4 mmol/L (ref 3.5–5.1)
Sodium: 133 mmol/L — ABNORMAL LOW (ref 135–145)
Total Bilirubin: 1.6 mg/dL — ABNORMAL HIGH (ref 0.3–1.2)
Total Protein: 8.8 g/dL — ABNORMAL HIGH (ref 6.5–8.1)

## 2021-06-21 LAB — MAGNESIUM: Magnesium: 1.7 mg/dL (ref 1.7–2.4)

## 2021-06-21 MED ORDER — HEPARIN SOD (PORK) LOCK FLUSH 100 UNIT/ML IV SOLN
500.0000 [IU] | Freq: Once | INTRAVENOUS | Status: AC
  Administered 2021-06-21: 500 [IU]

## 2021-06-21 MED ORDER — OXYCODONE HCL 5 MG PO TABS: 5.0000 mg | ORAL_TABLET | Freq: Four times a day (QID) | ORAL | 0 refills | Status: DC | PRN

## 2021-06-21 MED ORDER — LONSURF 15-6.14 MG PO TABS
ORAL_TABLET | ORAL | 0 refills | Status: DC
  Filled 2021-06-21: qty 60, fill #0

## 2021-06-21 MED ORDER — SODIUM CHLORIDE 0.9% FLUSH
10.0000 mL | Freq: Once | INTRAVENOUS | Status: AC
  Administered 2021-06-21: 10 mL

## 2021-06-21 NOTE — Progress Notes (Signed)
?[place] ?OFFICE PROGRESS NOTE ? ? ?Diagnosis: Colon cancer ? ?INTERVAL HISTORY:  ? ?[name] returns as scheduled.  She began cycle 3 FOLFOX 06/05/2021.  During the Oxaliplatin infusion she developed severe chills and then fever.  She did not complete the Oxaliplatin and declined the 5-fluorouracil push and pump.  Blood cultures were obtained.  She was admitted to [facility] 06/06/2021 with persistent fever.  No source for infection identified.  Fever felt to likely be related to chemotherapy. ? ?She has had no further fever.  She developed a rash following the hospitalization.  The rash has resolved.  Appetite is poor.  She is drinking boost.  She has occasional nausea.  No vomiting.  Intermittent abdominal pain various locations.  She would like a refill on oxycodone. ? ?Objective: ? ?Vital signs in last 24 hours: ? ?Blood pressure 117/78, pulse 100, temperature 98.2 ?F (36.8 ?C), temperature source Oral, resp. rate 18, height 5' 4"  (1.626 m), weight 145 lb (65.8 kg), SpO2 98 %. ?  ? ?Resp: Lungs clear bilaterally. ?Cardio: Regular rate and rhythm. ?GI: Abdomen is distended, diffuse firmness. ?Vascular: No leg edema. ?Skin: No rash. ?Port-A-Cath without erythema. ? ?Lab Results: ? ?Lab Results  ?Component Value Date  ? WBC 3.8 (L) 06/21/2021  ? HGB 10.9 (L) 06/21/2021  ? HCT 34.9 (L) 06/21/2021  ? MCV 84.7 06/21/2021  ? PLT 201 06/21/2021  ? NEUTROABS 2.5 06/21/2021  ? ? ?Imaging: ? ?No results found. ? ?Medications: I have reviewed the patient's current medications. ? 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 ?The tumor returned microsatellite stable with no loss of mismatch repair protein expression   ?APC mutated. No BRAF, KRAS, or NRAS mutation On Foundation 1 testing   ?Cycle 1 adjuvant FOLFOX 09/08/2013   ?Cycle 2 adjuvant FOLFOX 09/24/2013   ?Cycle 3 adjuvant  FOLFOX 10/08/2013.   ?Cycle 4 adjuvant FOLFOX 10/22/2013.   ?Cycle 5 adjuvant FOLFOX 11/05/2013. Oxaliplatin held due to thrombocytopenia. ?Cycle 6 FOLFOX 11/19/2013. ?Cycle 7 FOLFOX 12/03/2013. Oxaliplatin held secondary to thrombocytopenia. ?Cycle 8 FOLFOX 12/17/2013. ?Cycle 9 FOLFOX 01/04/2014. Oxaliplatin held secondary to neutropenia.   ?Cycle 10 FOLFOX 01/21/2014. Oxaliplatin held secondary to thrombocytopenia. ?Cycle 11 FOLFOX 02/04/2014 ?Cycle 12 FOLFOX 02/18/2014, oxaliplatin dose reduced secondary tothrombocytopenia ?CT abdomen/pelvis 01/30/2014 revealed splenomegaly and no evidence of recurrent colon cancer ?CT chest 04/07/2014 with a stable right lower lobe nodule and no evidence for metastatic disease, no nodules seen on the CT 11/26/2014 ?Markedly elevated CEA 11/24/2014 ?CT 11/26/2014 revealed a right pelvic mass, splenomegaly, small volume ascites ?Right salpingo-oophorectomy 12/28/2014 with the pathology confirming metastatic colon cancer ?CTs 03/23/2016-no evidence of recurrent or metastatic disease. ?CT 11/26/2016-enlargement of a fluid density structure the right pelvic sidewall, no other evidence of metastatic disease ?CT aspiration right pelvic cyst 12/19/2016.  Cytology-BENIGN REACTIVE/REPARATIVE CHANGES. ?CTs 06/05/2017- no evidence of metastatic disease, mild cirrhotic changes with splenomegaly ?CTs 12/11/2017- recurrent cystic right adnexal mass, similar to on the CT 11/26/2016, stable mild splenomegaly ?CTs 04/23/2018- enlargement of cystic right adnexal mass with mural nodularity, no other evidence of metastatic disease ?Cytoreductive surgery/HIPEC with mitomycin by Dr. Clovis Riley at [facility] 08/12/2018-R1 resection achieved.  Cytoreduction included omentectomy, LAR, right salpingo-oophorectomy and left colonic gutter/pelvic stripping.  Pathology on the rectum showed recurrent/metastatic adenocarcinoma, tumor 2.0 cm, predominantly involving the subserosa and muscularis propria of the colon, proximal  and distal margins of resection were negative, vascular invasion present,  metastatic carcinoma present in 1 out of 5 lymph nodes; omentum resection with no malignancy seen, no metastatic carcinoma identified in 1 lymph node examined; left gutter stripping positive metastatic adenocarcinoma; right ovary resection positive metastatic adenocarcinoma. 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 ?CEA 68 02/03/2019 ?Biopsy liver lesion 02/11/2019-metastatic adenocarcinoma consistent with primary colonic adenocarcinoma ?CTs 02/27/2019-multiple liver lesions increased in size, new lesion in the lateral right lobe of the liver.  No evidence of metastatic disease in the chest.  Redemonstrated moderate left hydronephrosis and proximal hydroureter without discrete lesion or obstructing etiology at the transition point of the mid ureter. ?Cycle 1 FOLFIRI/Panitumumab 03/12/2019 ?Cycle 2 FOLFIRI/Panitumumab 03/26/2019-bolus 5-FU and irinotecan held secondary to neutropenia ?Cycle 3 FOLFIRI/Panitumumab 04/09/2019, Udenyca added-not given secondary to seizure/discontinuation of the 5-FU pump ?CT abdomen/pelvis [facility] 05/04/2019-no residual fluid collection at the left abdominal wall abscess, stable moderate left hydronephrosis, multifocal indeterminate liver lesions--liver lesions significantly improved ?Cycle 4 FOLFIRI/Panitumumab 05/07/2019, Udenyca ?Cycle 5 FOLFIRI/Panitumumab 05/20/2019, Udenyca ?Cycle 6 FOLFIRI/Panitumumab 06/18/2019, Udenyca ?Cycle 7 FOLFIRI/Panitumumab 07/01/2019 (Irinotecan dose reduced due to thrombocytopenia), Udenyca--Udenyca was not given ?Cycle 8 FOLFIRI/Panitumumab 07/15/2019, Udenyca ?Cycle 9 FOLFIRI/Panitumumab 07/29/2019, Udenyca ?Cycle 10 FOLFIRI/Panitumumab 08/13/2019, Udenyca ?CTs at [facility] 08/19/2019-decreased size of the hypodense and hyperdense lesions within segment 2 of the left hepatic lobe.  No new lesions identified.   Similar presacral soft tissue thickening with adjacent stable alignment in the rectum.  Improved but persistent left UPJ obstruction with mild left hydronephrosis.  Persistent but decreased size of the wound within the left mid abdomen abdominal wall. ?Cycle 11 irinotecan/Panitumumab 08/27/2019, Udenyca ?Cycle 12 irinotecan/Panitumumab 10/08/2019, Udenyca ?Cycle 13 irinotecan/Panitumumab 11/05/2019, Udenyca ?CT abdomen/pelvis 11/19/2019-no new or progressive interval findings.  Stable appearance of the calcified lesion in the anterior left hepatic dome with second tiny low-density lesion in the dome of the lateral segment left liver.  Both lesions are markedly decreased since 02/27/2019.  Stable mild fullness left intrarenal collecting system and renal pelvis.  Similar appearance of abnormal soft tissue in the left paramidline subcutaneous fat with associated skin thickening, this likely correlates to the fistula. ?Cycle 14 irinotecan/Panitumumab 11/19/2019, Udenyca ?Cycle 15 irinotecan/Panitumumab 12/02/2019, Udenyca ?CT abdomen/pelvis at [facility] 02/11/2020- similar to slight decrease in segment 2 hypodense and hyperdense lesions (compared to a CT from July 2020), no new lesions, splenomegaly, no fluid collection or abscess, mild stranding adjacent to the incision with overlying skin thickening ?Cycle 16 irinotecan/Panitumumab 03/03/2020, Udenyca ?Cycle 17 irinotecan/Panitumumab 03/17/2020, Udenyca ?Cycle 18 irinotecan/panitumumab 04/07/2020, Udenyca ?Cycle 19 irinotecan/Panitumumab 04/28/2020, Udenyca ?Cycle 20 irinotecan/panitumumab 05/26/2020, Udenyca ?Cycle 21 irinotecan/panitumumab 06/30/2020, Udenyca ?CT abdomen/pelvis 07/19/2020-mildly increased hepatic steatosis.  Increased size of 2.8 cm hypervascular lesion segment 2 left hepatic lobe. ?Cycle 22 irinotecan/Panitumumab 07/21/2020, Udenyca ?MRI Abd 07/28/2020: liver lesions consistent with hepatic metastasis ?Cycle 23 irinotecan/Panitumumab 08/04/2020, Udenyca  ?Cycle 24  irinotecan/panitumumab 08/22/2020 ?Irinotecan/panitumumab discontinued secondary to diarrhea and skin toxicity ?Guardant 360 on 09/12/2020-K-ras G12V, PIK3CA,NTRK2-VUS, tumor mutation burden 6.85, MSI-high not

## 2021-06-21 NOTE — Patient Instructions (Signed)

## 2021-06-22 ENCOUNTER — Telehealth: Payer: Self-pay | Admitting: Pharmacist

## 2021-06-22 ENCOUNTER — Other Ambulatory Visit (HOSPITAL_COMMUNITY): Payer: Self-pay

## 2021-06-22 ENCOUNTER — Encounter: Payer: Self-pay | Admitting: Oncology

## 2021-06-22 DIAGNOSIS — C189 Malignant neoplasm of colon, unspecified: Secondary | ICD-10-CM

## 2021-06-22 MED ORDER — LONSURF 15-6.14 MG PO TABS
45.0000 mg | ORAL_TABLET | Freq: Two times a day (BID) | ORAL | 0 refills | Status: DC
  Filled 2021-06-22: qty 60, 10d supply, fill #0
  Filled 2021-06-23: qty 60, 28d supply, fill #0

## 2021-06-22 NOTE — Telephone Encounter (Signed)
Oral Oncology Pharmacist Encounter   Received notification from [facility] that prior authorization for [place] is required.   PA submitted on CMM Key [id number] Status is pending   Oral Oncology Clinic will continue to follow.   Darl Pikes, PharmD, BCPS, [facility] Hematology/Oncology Clinical Pharmacist [facility]/HP Oral [place] [facility] [phone]  06/22/2021 4:01 PM

## 2021-06-22 NOTE — Telephone Encounter (Signed)
Oral Oncology Pharmacist Encounter  Received new prescription for Lonsurf (trifluridine/tipiracil) for the treatment of metastatic colon cancer, planned duration until disease progression or unacceptable drug toxicity.  CMP/CBC from 06/21/21 assessed, no relevant lab abnormalities. Prescription dose and frequency assessed.   Current medication list in Epic reviewed, no DDIs with Lonsurf identified.  Evaluated chart and no patient barriers to medication adherence identified.   Prescription has been e-scribed to the [facility] for benefits analysis and approval.  Oral Oncology Clinic will continue to follow for insurance authorization, copayment issues, initial counseling and start date.  Patient agreed to treatment on 06/21/21 per MD documentation.  Darl Pikes, PharmD, BCPS, BCOP, CPP Hematology/Oncology Clinical Pharmacist Practitioner [place]/DB/AP Oral [place] [facility] [phone]  06/22/2021 3:44 PM

## 2021-06-23 ENCOUNTER — Other Ambulatory Visit (HOSPITAL_COMMUNITY): Payer: Self-pay

## 2021-06-23 ENCOUNTER — Inpatient Hospital Stay: Payer: BC Managed Care – PPO

## 2021-06-23 NOTE — Telephone Encounter (Signed)
Oral Oncology Pharmacist Encounter   Prior Authorization for [name] has been approved.     Effective dates: 06/22/21 through 06/21/22  Copay: $0   Oral Oncology Clinic will continue to follow.   Darl Pikes, PharmD, BCPS. BCOP Hematology/Oncology Clinical Pharmacist [facility]/HP/AP Oral [facility] [phone]  06/23/2021 11:33 AM

## 2021-06-26 ENCOUNTER — Other Ambulatory Visit (HOSPITAL_COMMUNITY): Payer: Self-pay

## 2021-06-26 ENCOUNTER — Ambulatory Visit (HOSPITAL_COMMUNITY)
Admission: RE | Admit: 2021-06-26 | Discharge: 2021-06-26 | Disposition: A | Discharge status: Home / Self Care | Payer: BC Managed Care – PPO | Source: Ambulatory Visit | Attending: Internal Medicine | Admitting: Internal Medicine

## 2021-06-26 DIAGNOSIS — R1011 Right upper quadrant pain: Secondary | ICD-10-CM | POA: Insufficient documentation

## 2021-06-26 DIAGNOSIS — I8500 Esophageal varices without bleeding: Secondary | ICD-10-CM | POA: Diagnosis not present

## 2021-06-26 DIAGNOSIS — K766 Portal hypertension: Secondary | ICD-10-CM | POA: Diagnosis not present

## 2021-06-26 DIAGNOSIS — I868 Varicose veins of other specified sites: Secondary | ICD-10-CM | POA: Diagnosis not present

## 2021-06-26 DIAGNOSIS — K3189 Other diseases of stomach and duodenum: Secondary | ICD-10-CM | POA: Diagnosis not present

## 2021-06-26 DIAGNOSIS — R112 Nausea with vomiting, unspecified: Secondary | ICD-10-CM | POA: Diagnosis not present

## 2021-06-26 DIAGNOSIS — C189 Malignant neoplasm of colon, unspecified: Secondary | ICD-10-CM | POA: Diagnosis not present

## 2021-06-26 DIAGNOSIS — C787 Secondary malignant neoplasm of liver and intrahepatic bile duct: Secondary | ICD-10-CM | POA: Diagnosis not present

## 2021-06-26 DIAGNOSIS — R188 Other ascites: Secondary | ICD-10-CM | POA: Insufficient documentation

## 2021-06-26 DIAGNOSIS — R161 Splenomegaly, not elsewhere classified: Secondary | ICD-10-CM | POA: Insufficient documentation

## 2021-06-26 DIAGNOSIS — K7689 Other specified diseases of liver: Secondary | ICD-10-CM | POA: Diagnosis not present

## 2021-06-27 ENCOUNTER — Ambulatory Visit (INDEPENDENT_AMBULATORY_CARE_PROVIDER_SITE_OTHER): Payer: BC Managed Care – PPO | Admitting: Nurse Practitioner

## 2021-06-27 ENCOUNTER — Other Ambulatory Visit (HOSPITAL_BASED_OUTPATIENT_CLINIC_OR_DEPARTMENT_OTHER): Payer: Self-pay | Admitting: Family Medicine

## 2021-06-27 ENCOUNTER — Encounter: Payer: Self-pay | Admitting: Nurse Practitioner

## 2021-06-27 VITALS — BP 114/62 | HR 102 | Ht 64.57 in | Wt 139.4 lb

## 2021-06-27 DIAGNOSIS — C787 Secondary malignant neoplasm of liver and intrahepatic bile duct: Secondary | ICD-10-CM | POA: Diagnosis not present

## 2021-06-27 DIAGNOSIS — C189 Malignant neoplasm of colon, unspecified: Secondary | ICD-10-CM | POA: Diagnosis not present

## 2021-06-27 DIAGNOSIS — K625 Hemorrhage of anus and rectum: Secondary | ICD-10-CM

## 2021-06-27 NOTE — Progress Notes (Signed)
  Assessment   Patient Profile:  [name] is a 46 y.o. female known to Dr. [name] with a past medical history of metastatic colon cancer s/p right hemicolectomy in 2015, colostomy followed by reversal, R salpingo oophorectomy 2016 with pathology confirming metastatic colon cancer. Biopsy confirmed metastatic disease to liver s/p Y90. Additional medical history as listed in PMH .  46 yo female with metastatic colon cancer on chemotherapy with complex surgical history presenting with two months of painless rectal bleeding. Presentation concerning for tumor invasion into colon.  CT scan in April shows a mass anterior to the rectum concerning for recurrent neoplasm. On DRE today the anterior wall of rectum is firm and on anoscopic view there was a scant amount of red blood streaming from anterior wall without an obvious lesion.  Since I could not identify a lesion from where the blood was coming I asked Dr. Danis to take a look ( Her primary GI, Dr. [name] is covering hospital this week).  Dr. Danis repeated the rectal exam and found the anterior wall of rectum was firm  Dr. Danis and I talked with Abiageal and her mother about our concerns about the bleeding. Elona expresses concerns that she may not fully understand her overall condition / prognosis.    Acute on chronic anemia.  Baseline hgb ~8-9. Hgb 5.8 during recent admission for fevers. Transfused 2 units of PRBCs. No mention in discharge summary that patient was having rectal bleeding at that time.  Hgb improved post transfusion and is stable at 10.9.   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 Liver doppler negative for PVT. No reported cirrhosis on imaging  Plan   Will discuss with Dr. [name], patient's primary GI. Patient may need anoscopy / flexible sigmoidoscopy to evaluate the bleeding.   HPI   Chief Complaint : Rectal bleeding  [name] is a 46-year-old female  with history of DM2, seizures, colon cancer prior right hemicolectomy in 2015. She represented with pelvic disease in July 2020 and underwent surgery including an  omentectomy, LAR, right salpingo-oophorectomy and left colonic gutter/pelvic stripping.  Pathology on the rectum showed recurrent/metastatic adenocarcinoma. Right ovary resection positive for metastatic adenocarcinoma.  Colostomy was done and at some point reversed. 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 Liver biopsy positive for metastatic adenocarcinoma. She underwent Y90 treatment. She has been undergoing chemotherapy. FOLFOX recently stopped due to poor tolerance, she is starting Lonsurf.    Shironda was hospitalized the beginning of this month with fever after chemotherapy. Patient underwent fever work-up and was started on broad-spectrum antibiotics due to her immune suppressed status.  Blood cultures and urine culture as well as chest x-ray were negative.  No known source of fever or infection was found.  Antibiotics were discontinued.  This could have been secondary to chemotherapy similar in presentation in March. She also had symptomatic anemia, was given 2 units of blood  Dessiree was seen by Amanda Collier, P.A on 06/01/2021 for RUQ pain  CT scan early April had shown prominent submucosal fold in her stomach possibly due to incomplete distention.  A RUQ ultrasound had shown a 10 mm echogenic structure in the lumen of the gallbladder.  She was scheduled for a HIDA scan as well as an upper endoscopy.  EGD showed varices /gastric portal hypertension.  She was subsequently scheduled for an ultrasound liver Doppler which showed no evidence of portal vein thrombosis.    HIDA scan was normal. She has had paracentesis done on a few occasions, all negative for malignant cells.   Interval History:  Patient called in on 5/17 with complaint of red blood in stool.  She was given this appointment. She says the  bleeding has been present for about 2 months. She has left mid abdominal discomfort at times but no rectal pain.   Of note a CT scan  05/06/21 showing a 3.2 x 2.1 cm soft tissue density in the left side of pelvis inseparable from the vaginal cuff and anterior margin of rectum. Possibly a local recurrence of neoplastic process   Previous GI Evaluation   06/13/21 EGD for abdominal pain  Esophageal varices 2. Portal hypertensive gastropathy 3. Otherwise normal EGD 4. This patient has evidence of portal hypertension based on presence of varices, portalhypertensive gastropathy, splenomegaly on imaging, elevated bilirubin, and cytopenias. Also with ascites (malignant versus secondary to portal hypertension). 5. Metastatic cancer  Labs:     Latest Ref Rng & Units 06/21/2021   10:05 AM 06/13/2021    1:23 PM 06/08/2021    6:52 AM  CBC  WBC 4.0 - 10.5 K/uL 3.8   2.9     Hemoglobin 12.0 - 15.0 g/dL 10.9   10.6   5.8    Hematocrit 36.0 - 46.0 % 34.9   34.3   18.4    Platelets 150 - 400 K/uL 201   160          Latest Ref Rng & Units 06/21/2021   10:05 AM 06/07/2021    4:52 AM 06/06/2021   12:46 AM  Hepatic Function  Total Protein 6.5 - 8.1 g/dL 8.8   6.1   7.7    Albumin 3.5 - 5.0 g/dL 3.1   2.1   2.7    AST 15 - 41 U/L 21   25   32    ALT 0 - 44 U/L _0 Alk Phosphatase 38 - 126 U/L 117   105   139    Total Bilirubin 0.3 - 1.2 mg/dL 1.6   2.3   1.5       Past Medical History:  Diagnosis Date   ADD (attention deficit disorder)    Anemia, iron deficiency    Anxiety    Colon cancer ([place]) 08/2013   Depression    Diabetes ([place])    Difficulty swallowing pills    Gallstones    GERD (gastroesophageal reflux disease)    Headache    migraines "long time ago"   History of blood transfusion 08/16/2013   History of migraine    no problems in "a long time"   IBS (irritable bowel syndrome)    Metastatic adenocarcinoma of ovary, 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   Restless leg syndrome    Seizures  ([place])    last seizure 08/2012    Past Surgical History:  Procedure Laterality Date   ABDOMINAL HYSTERECTOMY  08/24/2004   partial   APPENDECTOMY  08/24/2004   COLON SURGERY  08/11/2013   removed a foot of colon   COLONOSCOPY  2019   colonoscopy 10-18-14     COLONOSCOPY WITH PROPOFOL  07/07/2013   CYSTOSCOPY  11/01/2003   ENDOMETRIAL FULGURATION  11/01/2003   ESOPHAGOGASTRODUODENOSCOPY  07/07/2013   IR ANGIOGRAM SELECTIVE EACH ADDITIONAL VESSEL  11/03/2020   IR ANGIOGRAM SELECTIVE EACH ADDITIONAL VESSEL  11/03/2020   IR ANGIOGRAM SELECTIVE EACH ADDITIONAL VESSEL  11/03/2020   IR ANGIOGRAM  SELECTIVE EACH ADDITIONAL VESSEL  11/03/2020   IR ANGIOGRAM SELECTIVE EACH ADDITIONAL VESSEL  11/17/2020   IR ANGIOGRAM SELECTIVE EACH ADDITIONAL VESSEL  11/17/2020   IR ANGIOGRAM SELECTIVE EACH ADDITIONAL VESSEL  11/17/2020   IR ANGIOGRAM SELECTIVE EACH ADDITIONAL VESSEL  12/08/2020   IR ANGIOGRAM SELECTIVE EACH ADDITIONAL VESSEL  12/08/2020   IR ANGIOGRAM VISCERAL SELECTIVE  11/03/2020   IR ANGIOGRAM VISCERAL SELECTIVE  11/03/2020   IR ANGIOGRAM VISCERAL SELECTIVE  11/17/2020   IR ANGIOGRAM VISCERAL SELECTIVE  12/08/2020   IR EMBO ARTERIAL NOT HEMORR HEMANG INC GUIDE ROADMAPPING  11/03/2020   IR EMBO TUMOR ORGAN ISCHEMIA INFARCT INC GUIDE ROADMAPPING  11/17/2020   IR EMBO TUMOR ORGAN ISCHEMIA INFARCT INC GUIDE ROADMAPPING  12/08/2020   IR IMAGING GUIDED PORT INSERTION  03/05/2019   IR PARACENTESIS  05/30/2021   IR RADIOLOGIST EVAL & MGMT  10/06/2020   IR RADIOLOGIST EVAL & MGMT  01/17/2021   IR RADIOLOGIST EVAL & MGMT  03/17/2021   IR US GUIDE VASC ACCESS RIGHT  11/03/2020   IR US GUIDE VASC ACCESS RIGHT  11/17/2020   IR US GUIDE VASC ACCESS RIGHT  12/08/2020   LAPAROSCOPIC LYSIS OF ADHESIONS  11/01/2003   LAPAROSCOPIC PARTIAL COLECTOMY Right 08/11/2013   Procedure: LAPAROSCOPIC RIGHT HEMICOLECTOMY;  Surgeon: Stark Klein, MD;  Location: [place];  Service: General;  Laterality: Right;   LAPAROTOMY N/A 12/28/2014    Procedure: EXPLORATORY LAPAROTOMY;  Surgeon: Everitt Amber, MD;  Location: [facility];  Service: Gynecology;  Laterality: N/A;   LEFT OOPHORECTOMY  08/24/2004   PANNICULECTOMY  08/24/2004   PORT-A-CATH REMOVAL Left 06/08/2014   Procedure: REMOVAL PORT-A-CATH;  Surgeon: Stark Klein, MD;  Location: [place];  Service: General;  Laterality: Left;   PORTACATH PLACEMENT Left 09/09/2013   Procedure: INSERTION PORT-A-CATH;  Surgeon: Stark Klein, MD;  Location: [place];  Service: General;  Laterality: Left;   SALPINGOOPHORECTOMY Right 12/28/2014   Procedure: RIGHT SALPINGO OOPHORECTOMY;  Surgeon: Everitt Amber, MD;  Location: [facility];  Service: Gynecology;  Laterality: Right;   TUBAL LIGATION  11/07/2000   UNILATERAL SALPINGECTOMY Left 08/24/2004   WISDOM TOOTH EXTRACTION Bilateral 1994    Current Medications, Allergies, Family History and Social History were reviewed in [employer] record.     Current Outpatient Medications  Medication Sig Dispense Refill   Accu-Chek Softclix Lancets lancets SMARTSIG:2 Topical Twice Daily     acetaminophen (TYLENOL) 500 MG tablet Take 1,000 mg by mouth every 6 (six) hours as needed for mild pain.     blood glucose meter kit and supplies Dispense based on patient and insurance preference. Use up to four times daily as directed. (FOR ICD-10 E10.9, E11.9). 1 each 1   docusate sodium (COLACE) 100 MG capsule Take 1 capsule (100 mg total) by mouth every 12 (twelve) hours. (Patient taking differently: Take 100 mg by mouth daily as needed for mild constipation.) 20 capsule 0   furosemide (LASIX) 20 MG tablet Take 1 tablet (20 mg total) by mouth daily. 30 tablet 1   hydrOXYzine (ATARAX) 25 MG tablet Take 1 tablet (25 mg total) by mouth at bedtime. 30 tablet 5   insulin NPH Human (NOVOLIN N) 100 UNIT/ML injection Inject 0.1 mLs (10 Units total) into the skin at bedtime. 10 mL 1   lacosamide (VIMPAT) 200 MG TABS tablet Take 1 tablet by mouth twice daily  (Patient taking differently: Take 200 mg by mouth 2 (two) times daily.) 60 tablet 5  loperamide (IMODIUM) 2 MG capsule Take 2 mg by mouth as needed for diarrhea or loose stools.     LORazepam (ATIVAN) 0.5 MG tablet Take 1 tablet by mouth twice daily as needed for anxiety 60 tablet 0   magic mouthwash SOLN Take 5 mLs by mouth 4 (four) times daily. (Patient taking differently: Take 5 mLs by mouth 4 (four) times daily as needed for mouth pain.) 240 mL 0   magnesium oxide (MAG-OX) 400 (240 Mg) MG tablet Take 1 tablet (400 mg total) by mouth 2 (two) times daily. 60 tablet 3   metFORMIN (GLUCOPHAGE) 500 MG tablet TAKE 1 TABLET BY MOUTH TWICE DAILY WITH A MEAL 180 tablet 0   oxyCODONE (ROXICODONE) 5 MG immediate release tablet Take 1-2 tablets (5-10 mg total) by mouth every 6 (six) hours as needed for severe pain. 60 tablet 0   pantoprazole (PROTONIX) 40 MG tablet Take 1 tablet (40 mg total) by mouth 2 (two) times daily. 60 tablet 1   potassium chloride (MICRO-K) 10 MEQ CR capsule Take 1 capsule (10 mEq total) by mouth daily. 30 capsule 2   prochlorperazine (COMPAZINE) 10 MG tablet Take 1 tablet (10 mg total) by mouth every 6 (six) hours as needed for nausea or vomiting. 60 tablet 2   spironolactone (ALDACTONE) 50 MG tablet Take 1 tablet (50 mg total) by mouth daily. 30 tablet 1   trifluridine-tipiracil (LONSURF) 15-6.14 MG tablet Take 3 tablets (45 mg of trifluridine total) by mouth 2 (two) times daily after a meal. Take within 1 hr of AM & PM meal. Take on days 1-5, 8-12. Repeat every 28day 60 tablet 0   No current facility-administered medications for this visit.    Review of Systems: No chest pain. No shortness of breath. No urinary complaints.    Physical Exam  Wt Readings from Last 3 Encounters:  06/27/21 139 lb 6 oz (63.2 kg)  06/21/21 145 lb (65.8 kg)  06/13/21 159 lb (72.1 kg)    BP 114/62   Pulse (!) 102   Ht 5' 4.57" (1.64 m)   Wt 139 lb 6 oz (63.2 kg)   BMI 23.50 kg/m   Constitutional:  Thin female in no acute distress. Psychiatric: Pleasant. Normal mood and affect. Behavior is normal. EENT: Pupils normal.  Conjunctivae are normal. No scleral icterus. Neck supple.  Cardiovascular: Normal rate, regular rhythm. No edema Pulmonary/chest: Effort normal and breath sounds normal. No wheezing, rales or rhonchi. Abdominal: Soft, mildly distended, nontender. Bowel sounds active throughout. Rectal exam: Minimal swelling of external hemorrhoids. On DRE she was not tender. There was firmness of anterior wall. On anoscopy there was a very small stream of fresh red blood coming off the anterior wall but I could not identify a lesion there.  Neurological: Alert and oriented to person place and time. Skin: Skin is warm and dry. No rashes noted.  I spent 40 minutes total reviewing records, obtaining history, performing exam, counseling patient and documenting visit / findings.    , NP  06/27/2021, 1:47 PM          

## 2021-06-27 NOTE — Patient Instructions (Signed)
If you are age 48 or younger, your body mass index should be between 19-25. Your Body mass index is 23.5 kg/m. If this is out of the aformentioned range listed, please consider follow up with your Primary Care Provider.  ________________________________________________________  The [facility] providers would like to encourage you to use [facility] to communicate with providers for non-urgent requests or questions.  Due to long hold times on the telephone, sending your provider a message by [facility] may be a faster and more efficient way to get a response.  Please allow 48 business hours for a response.  Please remember that this is for non-urgent requests.  _______________________________________________________  Nevin Bloodgood will be discussing you visit with [name]. Please expect a phone call about what the plan for your treatment will be.  Follow up as needed for now.  Thank you for entrusting me with your care and choosing [facility].  Tye Savoy, NP-C

## 2021-06-27 NOTE — Telephone Encounter (Signed)
Oral Chemotherapy Pharmacist Encounter  Patient educated on 06/23/21 and she picked up on [place] from [place] on 06/26/21 to start that day.  Patient Education I spoke with patient for overview of new oral chemotherapy medication: Lonsurf (trifluridine/tipiracil) for the treatment of metastatic colon cancer, planned duration until disease progression or unacceptable drug toxicity.   Pt is doing well. Counseled patient on administration, dosing, side effects, monitoring, drug-food interactions, safe handling, storage, and disposal. Patient will take 3 tablets (45 mg of trifluridine total) by mouth 2 (two) times daily after a meal. Take within 1 hr of AM & PM meal. Take on days 1-5, 8-12. Repeat every 28day.  Side effects include but not limited to: diarrhea, decreased wbc, fatigue, nausea.    Reviewed with patient importance of keeping a medication schedule and plan for any missed doses.  After discussion with patient no patient barriers to medication adherence identified.   Ms. Dowler voiced understanding and appreciation. All questions answered. Medication handout and calendar provided.  Provided patient with [facility] phone number. Patient knows to call the office with questions or concerns. Oral Chemotherapy Navigation Clinic will continue to follow.  Darl Pikes, PharmD, BCPS, BCOP, CPP Hematology/Oncology Clinical Pharmacist Practitioner [place]/DB/AP [facility] [phone]  06/27/2021 4:36 PM

## 2021-06-28 ENCOUNTER — Telehealth: Payer: Self-pay

## 2021-06-28 NOTE — Progress Notes (Signed)
Reviewed. This patient has metastatic disease.  I recently saw her for EGD.  She has portal hypertension and varices. Imaging previously suggested disease in the area of the rectum. She may have bleeding related to tumor invading the rectum.  There is no endoscopic intervention to address this particular problem.  It is not clear to me that endoscopic confirmation will alter management.  If bleeding is SEVERE, could consider endoscopic confirmation (flexible sigmoidoscopy) IF IR embolization therapy an option.  I do not believe she be a surgical candidate. From my perspective, no further plans at this time.  However, I will copy Dr. Benay Spice to see if he has additional thoughts or input. Thanks [place], [place]

## 2021-06-28 NOTE — Telephone Encounter (Signed)
Gave pt recommendations. Pt verbalized understanding and had no other concerns at end of call.  

## 2021-06-28 NOTE — Telephone Encounter (Signed)
-----   Message from Willia Craze, NP sent at 06/28/2021  3:39 PM EDT ----- Mickel Baas,  Please call patient and let her know that right now there isn't a need for lower endoscopy as the procedure may not be able to confirm where the rectal bleeding is coming from and even if it did we couldn't treat is endoscopically. [name] reached out to Dr. Learta Codding to let him know what was going on. He will discuss in more detail when he sees her in office again. Thanks

## 2021-07-05 ENCOUNTER — Other Ambulatory Visit: Payer: Self-pay | Admitting: *Deleted

## 2021-07-05 ENCOUNTER — Encounter: Payer: Self-pay | Admitting: *Deleted

## 2021-07-07 ENCOUNTER — Other Ambulatory Visit: Payer: Self-pay | Admitting: Nurse Practitioner

## 2021-07-07 ENCOUNTER — Encounter: Payer: Self-pay | Admitting: Nurse Practitioner

## 2021-07-07 DIAGNOSIS — C189 Malignant neoplasm of colon, unspecified: Secondary | ICD-10-CM

## 2021-07-07 MED ORDER — OXYCODONE HCL 5 MG PO TABS: 5.0000 mg | ORAL_TABLET | Freq: Four times a day (QID) | ORAL | 0 refills | Status: DC | PRN

## 2021-07-07 NOTE — Patient Outreach (Signed)
[facility]) Care Management Geriatric Nurse Practitioner Note   07/07/2021 Name:  [name] MRN:  [id number] DOB:  [birth year]  Summary: [facility] Care Management pt with priority dx of metastatic colon cancer with depression and stress  Recommendations/Changes made from today's visit: Will refer to LCSW in onocolgy for assistance in obtaining Rx for an antidepressant and pt/family counseling to assist with expectations of disease process and pt support needs. NP to continue telephonic support and counseling also.  Subjective: [name] is an 46 y.o. year old female who is a primary patient of [name], [name], MD. The care management team was consulted for assistance with care management and/or care coordination needs.    PT ADMITS TO DEPRESSION AND FAMILY DYSFUNCTION AND STRESS WHICH ARE BARRIERS TO HER BEST QUALITY OF LIFE.   Geriatric Nurse Practitioner completed Telephone Visit today.   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   Ileus ([place])    Metastatic malignant neoplasm ([place])    Genetic testing 11/13/2015   Sinusitis 01/29/2015   Pelvic mass in female 12/28/2014   Ovarian mass, right    Hypokalemia 02/03/2014   Nausea 12/20/2013   Fever, unspecified 12/18/2013   Dehydration 12/18/2013   Diarrhea 10/28/2013   Malfunction of device 10/28/2013   Weight loss 10/28/2013   Mucositis (ulcerative) due to antineoplastic therapy 10/28/2013   Anemia due to antineoplastic chemotherapy  09/25/2013   Chemotherapy-induced thrombocytopenia 09/25/2013   Hypomagnesemia 09/25/2013   Hypophosphatemia 09/25/2013   Adverse drug effect 09/25/2013   Abdominal pain 09/24/2013   Primary malignant neoplasm of colon with metastasis in female ([place]) 08/05/2013   Anemia, iron deficiency 01/28/2013   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[place]) 11/11/2012   Discoloration of skin of face 09/03/2012   Medication reaction 05/03/2012   Migraine 03/31/2012   Hyperglycemia 01/18/2012   Fatigue 12/12/2011   ADD (attention deficit disorder) 09/23/2010   Right wrist pain 05/01/2010   LIBIDO, DECREASED 04/07/2010   IRON DEFICIENCY 05/17/2009   ANA POSITIVE 05/17/2009   OVARIAN CYST 05/10/2009   FATTY LIVER DISEASE 03/24/2009   TRANSAMINASES, SERUM, ELEVATED 03/21/2009   Gastroparesis 08/16/2008   DEPRESSION/ANXIETY 09/19/2006   HYPERLIPIDEMIA 08/16/2006   RESTLESS LEG SYNDROME 08/16/2006   GERD 08/16/2006   IBS 08/16/2006   Outpatient Encounter Medications as of 07/05/2021  Medication Sig   acetaminophen (TYLENOL) 500 MG tablet Take 1,000 mg by mouth every 6 (six) hours as needed for mild pain.   furosemide (LASIX) 20 MG tablet Take 1 tablet (20 mg total) by mouth daily.   hydrOXYzine (ATARAX) 25 MG tablet Take 1 tablet (25 mg total) by mouth at bedtime.   lacosamide (VIMPAT) 200 MG TABS tablet Take 1 tablet by mouth twice daily (Patient taking differently: Take 200 mg by mouth 2 (two) times daily.)   loperamide (IMODIUM) 2 MG capsule Take 2 mg by mouth as needed for diarrhea or loose stools.   magnesium oxide (MAG-OX) 400 (240 Mg) MG tablet Take 1 tablet (400 mg total)  by mouth 2 (two) times daily.   metFORMIN (GLUCOPHAGE) 500 MG tablet TAKE 1 TABLET BY MOUTH TWICE DAILY WITH A MEAL   oxyCODONE (ROXICODONE) 5 MG immediate release tablet Take 1-2 tablets (5-10 mg total) by mouth every 6 (six) hours as needed for severe pain.   pantoprazole (PROTONIX) 40 MG tablet Take 1 tablet (40 mg total)  by mouth 2 (two) times daily.   potassium chloride (MICRO-K) 10 MEQ CR capsule Take 1 capsule (10 mEq total) by mouth daily.   prochlorperazine (COMPAZINE) 10 MG tablet Take 1 tablet (10 mg total) by mouth every 6 (six) hours as needed for nausea or vomiting.   spironolactone (ALDACTONE) 50 MG tablet Take 1 tablet (50 mg total) by mouth daily.   trifluridine-tipiracil (LONSURF) 15-6.14 MG tablet Take 3 tablets (45 mg of trifluridine total) by mouth 2 (two) times daily after a meal. Take within 1 hr of AM & PM meal. Take on days 1-5, 8-12. Repeat every 28day   Accu-Chek Softclix Lancets lancets SMARTSIG:2 Topical Twice Daily   blood glucose meter kit and supplies Dispense based on patient and insurance preference. Use up to four times daily as directed. (FOR ICD-10 E10.9, E11.9).   docusate sodium (COLACE) 100 MG capsule Take 1 capsule (100 mg total) by mouth every 12 (twelve) hours. (Patient not taking: Reported on 07/05/2021)   insulin NPH Human (NOVOLIN N) 100 UNIT/ML injection Inject 0.1 mLs (10 Units total) into the skin at bedtime. (Patient not taking: Reported on 07/05/2021)   LORazepam (ATIVAN) 0.5 MG tablet Take 1 tablet by mouth twice daily as needed for anxiety (Patient not taking: Reported on 07/05/2021)   magic mouthwash SOLN Take 5 mLs by mouth 4 (four) times daily. (Patient not taking: Reported on 07/05/2021)   No facility-administered encounter medications on file as of 07/05/2021.   SDOH:  (Social Determinants of Health) assessments and interventions performed:  SDOH Interventions    Flowsheet Row Most Recent Value  SDOH Interventions   Stress Interventions Provide Counseling, Other (Comment)  [Refer to LCS Oncology]  Depression Interventions/Treatment  Counseling  [Refer to MD]         07/05/2021    1:44 PM 06/01/2021    1:50 PM 06/02/2020   10:43 AM  Depression screen PHQ 2/9  Decreased Interest 2 0 0  Down, Depressed, Hopeless 2 0 0  PHQ - 2 Score 4 0 0  Altered sleeping 1     Tired, decreased energy 3    Change in appetite 1    Feeling bad or failure about yourself  0    Trouble concentrating 0    Moving slowly or fidgety/restless 0    Suicidal thoughts 0    PHQ-9 Score 9    Difficult doing work/chores Very difficult      Care Plan  Review of patient past medical history, allergies, medications, health status, including review of consultants reports, laboratory and other test data, was performed as part of comprehensive evaluation for care management services.   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   Start Date: 07/07/2021  Expected End Date: 10/13/2021  Priority: High  Note:   Current Barriers:  Care Coordination needs related to Mental Health Concerns  and Family and relationship dysfunction    RNCM Clinical Goal(s):  Patient will demonstrate improved health management independence as evidenced by pt report        continue to work with RN Care Manager and/or Social Worker to address care management and care coordination needs related to Depression and Stress as evidenced by adherence to CM Team Scheduled appointments     work with Education officer, museum to address [place] Concerns  related to the management of Depression and Stress as evidenced by review of EMR and patient or Education officer, museum report     through collaboration with Consulting civil engineer, provider, and care team.   Interventions: Inter-disciplinary care team collaboration (see longitudinal plan of care) Evaluation of current treatment plan related to  self management and patient's adherence to plan as established by provider  Patient Goals/Self-Care Activities: Take medications as prescribed   Attend all scheduled  provider appointments Call pharmacy for medication refills 3-7 days in advance of running out of medications Perform all self care activities independently  Call provider office for new concerns or questions  Work with the social worker to address care coordination needs and will continue to work with the clinical team to address health care and disease management related needs        Plan: Telephone follow up appointment with care management team member scheduled for:  ONE WEEK.PT AGREES TO PLAN OF CARE.  [name]. Myrtie Neither, MSN, [facility] Gerontological Nurse Practitioner [facility] Care Management [phone]

## 2021-07-12 ENCOUNTER — Other Ambulatory Visit: Payer: Self-pay | Admitting: *Deleted

## 2021-07-12 NOTE — Patient Outreach (Addendum)
[facility]) Care Management Geriatric Nurse Practitioner Note   07/12/2021 Name:  [name] MRN:  [id number] DOB:  December 30, 1975  Summary: Needs interventions to relieve depressive symptoms and counseling to assist with family dysfunction.  Recommendations/Changes made from today's visit: Referral to oncologist and oncology CSW for Rx, counseling. See new goals in plan of care.  Subjective: [name] is an 46 y.o. year old female who is a primary patient of [name], [name], MD. The care management team was consulted for assistance with care management and/or care coordination needs.    Geriatric Nurse Practitioner completed Telephone Visit today.   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   Ileus ([place])    Metastatic malignant neoplasm ([place])    Genetic testing 11/13/2015   Sinusitis 01/29/2015   Pelvic mass in female 12/28/2014   Ovarian mass, right    Hypokalemia 02/03/2014   Nausea 12/20/2013   Fever, unspecified 12/18/2013   Dehydration 12/18/2013   Diarrhea 10/28/2013   Malfunction of device 10/28/2013   Weight loss 10/28/2013   Mucositis (ulcerative) due to antineoplastic therapy 10/28/2013   Anemia due to antineoplastic chemotherapy 09/25/2013   Chemotherapy-induced thrombocytopenia 09/25/2013   Hypomagnesemia 09/25/2013   Hypophosphatemia 09/25/2013   Adverse drug effect 09/25/2013   Abdominal pain 09/24/2013   Primary malignant neoplasm of colon with metastasis in female  ([place]) 08/05/2013   Anemia, iron deficiency 01/28/2013   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[place]) 11/11/2012   Discoloration of skin of face 09/03/2012   Medication reaction 05/03/2012   Migraine 03/31/2012   Hyperglycemia 01/18/2012   Fatigue 12/12/2011   ADD (attention deficit disorder) 09/23/2010   Right wrist pain 05/01/2010   LIBIDO, DECREASED 04/07/2010   IRON DEFICIENCY 05/17/2009   ANA POSITIVE 05/17/2009   OVARIAN CYST 05/10/2009   FATTY LIVER DISEASE 03/24/2009   TRANSAMINASES, SERUM, ELEVATED 03/21/2009   Gastroparesis 08/16/2008   DEPRESSION/ANXIETY 09/19/2006   HYPERLIPIDEMIA 08/16/2006   RESTLESS LEG SYNDROME 08/16/2006   GERD 08/16/2006   IBS 08/16/2006   Outpatient Encounter Medications as of 07/12/2021  Medication Sig   Accu-Chek Softclix Lancets lancets SMARTSIG:2 Topical Twice Daily   acetaminophen (TYLENOL) 500 MG tablet Take 1,000 mg by mouth every 6 (six) hours as needed for mild pain.   blood glucose meter kit and supplies Dispense based on patient and insurance preference. Use up to four times daily as directed. (FOR ICD-10 E10.9, E11.9).   docusate sodium (COLACE) 100 MG capsule Take 1 capsule (100 mg total) by mouth every 12 (twelve) hours. (Patient not taking: Reported on 07/05/2021)   furosemide (LASIX) 20 MG tablet Take 1 tablet (20 mg total) by mouth daily.   hydrOXYzine (ATARAX) 25 MG tablet Take 1 tablet (25 mg total) by mouth at bedtime.   insulin NPH Human (NOVOLIN N) 100 UNIT/ML injection Inject 0.1 mLs (10 Units total) into the skin at bedtime. (Patient not taking: Reported on 07/05/2021)   lacosamide (VIMPAT) 200 MG TABS tablet Take 1 tablet by mouth twice daily (  Patient taking differently: Take 200 mg by mouth 2 (two) times daily.)   loperamide (IMODIUM) 2 MG capsule Take 2 mg by mouth as needed for diarrhea or loose stools.   LORazepam (ATIVAN) 0.5 MG tablet Take 1 tablet by mouth twice daily as needed for anxiety (Patient not taking: Reported on  07/05/2021)   magic mouthwash SOLN Take 5 mLs by mouth 4 (four) times daily. (Patient not taking: Reported on 07/05/2021)   magnesium oxide (MAG-OX) 400 (240 Mg) MG tablet Take 1 tablet (400 mg total) by mouth 2 (two) times daily.   metFORMIN (GLUCOPHAGE) 500 MG tablet TAKE 1 TABLET BY MOUTH TWICE DAILY WITH A MEAL   oxyCODONE (ROXICODONE) 5 MG immediate release tablet Take 1-2 tablets (5-10 mg total) by mouth every 6 (six) hours as needed for severe pain.   pantoprazole (PROTONIX) 40 MG tablet Take 1 tablet (40 mg total) by mouth 2 (two) times daily.   potassium chloride (MICRO-K) 10 MEQ CR capsule Take 1 capsule (10 mEq total) by mouth daily.   prochlorperazine (COMPAZINE) 10 MG tablet Take 1 tablet (10 mg total) by mouth every 6 (six) hours as needed for nausea or vomiting.   spironolactone (ALDACTONE) 50 MG tablet Take 1 tablet (50 mg total) by mouth daily.   trifluridine-tipiracil (LONSURF) 15-6.14 MG tablet Take 3 tablets (45 mg of trifluridine total) by mouth 2 (two) times daily after a meal. Take within 1 hr of AM & PM meal. Take on days 1-5, 8-12. Repeat every 28day   No facility-administered encounter medications on file as of 07/12/2021.   Care Plan  Review of patient past medical history, allergies, medications, health status, including review of consultants reports, laboratory and other test data, was performed as part of comprehensive evaluation for care management services.   Care Plan : [facility] NP Plan of Care  Updates made by Deloria Lair, NP since 07/12/2021 12:00 AM     Problem: Stage IV metastatic colon cancer with depression and stress   Priority: High  Onset Date: 07/07/2021     Goal: Patient to make a list of things that family members could do to reduce her home stress and improve her quality of life.   Start Date: 07/12/2021  Expected End Date: 07/19/2021  Priority: High  Note:   Current Barriers:  Pt unable to advocate for herself, needs husband's assistance.  RNCM  Clinical Goal(s):  Patient will work with Education officer, museum to address [place] Concerns , Family and relationship dysfunction, and Limited education about disease trajectory, life expectancy* related to the management of metastatic colon ca as evidenced by review of EMR and patient or social worker report     demonstrate a decrease in depression, stress exacerbations regarding her health and family relationships as evidenced by pt report  through collaboration with Consulting civil engineer, provider, and care team.   Interventions: Inter-disciplinary care team collaboration (see longitudinal plan of care) Evaluation of current treatment plan related to  self management and patient's adherence to plan as established by provider  Patient Goals/Self-Care Activities: Attend church or other social activities Call provider office for new concerns or questions  Work with the social worker to address care coordination needs and will continue to work with the clinical team to address health care and disease management related needs      Goal: Patient and husband to plan a family meeting to discuss pt needs and healthy co-habitation.   Start Date: 07/12/2021  Expected End Date: 08/11/2021  Priority: Medium  Note:   Current Barriers:  Feels she is unable to advocate as other family members are sister-in-law and her son. Need husband involvement.  RNCM Clinical Goal(s):  Patient will work with Education officer, museum to address [place] Concerns  and Family and relationship dysfunction related to the management of Healthy Co-habitation as evidenced by review of EMR and patient or Education officer, museum report     through collaboration with Consulting civil engineer, provider, and care team.   Interventions: Inter-disciplinary care team collaboration (see longitudinal plan of care) Evaluation of current treatment plan related to  self management and patient's adherence to plan as established by provider  Patient Goals/Self-Care  Activities: Work with the social worker to address care coordination needs and will continue to work with the clinical team to address health care and disease management related needs      Long-Range Goal: Patient will report improved mood and decreased stress over the next 90 days per her report.   Start Date: 07/07/2021  Expected End Date: 10/13/2021  This Visit's Progress: Not on track  Priority: High  Note:    Update 07/12/21:  (Status: Goal on track: NO.) Long Term Goal  Evaluation of current treatment plan related to DEPRESSION and patient's adherence to plan as established by provider Long conversation with pt regarding her current state of mind. She admits to being very depressed and just as stressed as ever. She says she is having crying episodes, just being overwhelmed with her worries. She has a lot on her mind. She is not sure what her disease trajectory is or life expectancy. She has been making funeral plans but the expenses are going to be over what they could reasonably afford, her insurance is expiring and she has not been approved for Medicaid yet, she is waiting on word about this. She has called the CSW in charge of this but has not received call backs.        1) NP has messaged oncologist and LCSW in their office to request assistance with medication (antidepressant Rx) and counseling to answer some of these questions to alleviate some of the things she worries about.         2) NP has advised will reach out to Medicaid to try to get an update on her status. NP has done this today, left a message for [employer].        3) Requested that pt make a list of things that her sister-in-law and nephew could do to assist with household bills and consideration of pt health status. Plan a family meeting within the next month (new goals).        4) NP to talk with her husband about this and request his assistance and advocacy for his wife to improve her quality of life.        5)pt to  make a list of things she wants to do before she cannot do them. She wants to go to [place] again where she and her husband went for their honeymoon. NP to put out notice request for anyone that has or knows someone that has a vacation spot there that they could rent cheaply or consider free stay. Get your mani/pedi's every 2 weeks.        6) Be mindful to banish worries that you cannot do anything about from your mind. Have some distraction activities to take your mind off of these things: Read good book, reminisce on good memories,  take a walk, call a friend that makes you laugh....  Current Barriers:  Care Coordination needs related to Mental Health Concerns  and Family and relationship dysfunction   RNCM Clinical Goal(s):  Patient will demonstrate improved health management independence as evidenced by pt report        continue to work with RN Care Manager and/or Social Worker to address care management and care coordination needs related to Depression and Stress as evidenced by adherence to CM Team Scheduled appointments     work with Education officer, museum to address [place] Concerns  related to the management of Depression and Stress as evidenced by review of EMR and patient or Education officer, museum report     through collaboration with Consulting civil engineer, provider, and care team.   Interventions: Inter-disciplinary care team collaboration (see longitudinal plan of care) Evaluation of current treatment plan related to  self management and patient's adherence to plan as established by provider  Patient Goals/Self-Care Activities: Take medications as prescribed   Attend all scheduled provider appointments Call pharmacy for medication refills 3-7 days in advance of running out of medications Perform all self care activities independently  Call provider office for new concerns or questions  Work with the social worker to address care coordination needs and will continue to work with the clinical  team to address health care and disease management related needs        Plan: Telephone follow up appointment with care management team member scheduled for:  one week. Pt agrees to the plan of care.  4:00pm NP talked with husband, educated him about NP role and reviewed his wife's current health status and present issues with depression and stress. Advised of new goals we set today and requested his assistance in helping to meet the goals. He says he is willing to help.  [name]. Myrtie Neither, MSN, [facility] Gerontological Nurse Practitioner [facility] Care Management [phone]

## 2021-07-13 ENCOUNTER — Ambulatory Visit (INDEPENDENT_AMBULATORY_CARE_PROVIDER_SITE_OTHER): Payer: BC Managed Care – PPO | Admitting: Family Medicine

## 2021-07-13 ENCOUNTER — Encounter (HOSPITAL_BASED_OUTPATIENT_CLINIC_OR_DEPARTMENT_OTHER): Payer: Self-pay | Admitting: Family Medicine

## 2021-07-13 ENCOUNTER — Other Ambulatory Visit: Payer: Self-pay | Admitting: Oncology

## 2021-07-13 ENCOUNTER — Other Ambulatory Visit (HOSPITAL_COMMUNITY): Payer: Self-pay

## 2021-07-13 VITALS — BP 113/79 | HR 90 | Ht 64.5 in | Wt 140.3 lb

## 2021-07-13 DIAGNOSIS — C189 Malignant neoplasm of colon, unspecified: Secondary | ICD-10-CM

## 2021-07-13 DIAGNOSIS — Z794 Long term (current) use of insulin: Secondary | ICD-10-CM | POA: Diagnosis not present

## 2021-07-13 DIAGNOSIS — E1165 Type 2 diabetes mellitus with hyperglycemia: Secondary | ICD-10-CM | POA: Diagnosis not present

## 2021-07-13 MED ORDER — LONSURF 15-6.14 MG PO TABS
45.0000 mg | ORAL_TABLET | Freq: Two times a day (BID) | ORAL | 0 refills | Status: DC
  Filled 2021-07-13: qty 60, 28d supply, fill #0

## 2021-07-13 NOTE — Assessment & Plan Note (Signed)
She is doneShe is currently only taking metformin, was utilizing insulin, however this a few weeks ago.  Her primary concern is related to taking this with her metformin.  She has been checking her blood sugars and reports that readings will tend to be in the 120s to 150s.  During recent hospitalization, she did have A1c checked and that this was found to be 7.2% which is much better than a few months ago when it was 9.8%. Discussed options with patient, we will allow for her to continue with the metformin at this time and with close monitoring of her blood sugars at home.  If blood sugars are increasing, would consider resuming insulin We will plan to follow-up in about 2 months to reassess and recheck hemoglobin A1c or sooner as needed

## 2021-07-13 NOTE — Progress Notes (Signed)
    Procedures performed today:    None.  Independent interpretation of notes and tests performed by another provider:   None.  Brief History, Exam, Impression, and Recommendations:    BP 113/79   Pulse 90   Ht 5' 4.5" (1.638 m)   Wt 140 lb 4.8 oz (63.6 kg)   SpO2 100%   BMI 23.71 kg/m   Diabetes mellitus, type 2 ([place]) She is doneShe is currently only taking metformin, was utilizing insulin, however this a few weeks ago.  Her primary concern is related to taking this with her metformin.  She has been checking her blood sugars and reports that readings will tend to be in the 120s to 150s.  During recent hospitalization, she did have A1c checked and that this was found to be 7.2% which is much better than a few months ago when it was 9.8%. Discussed options with patient, we will allow for her to continue with the metformin at this time and with close monitoring of her blood sugars at home.  If blood sugars are increasing, would consider resuming insulin We will plan to follow-up in about 2 months to reassess and recheck hemoglobin A1c or sooner as needed  Return in about 2 months (around 09/12/2021) for DM.   ___________________________________________ Kelsei Defino [name], MD, ABFM, CAQSM [facility] [place]

## 2021-07-14 ENCOUNTER — Other Ambulatory Visit (HOSPITAL_COMMUNITY): Payer: Self-pay

## 2021-07-17 ENCOUNTER — Other Ambulatory Visit: Payer: Self-pay

## 2021-07-17 ENCOUNTER — Other Ambulatory Visit (HOSPITAL_COMMUNITY): Payer: Self-pay

## 2021-07-17 ENCOUNTER — Inpatient Hospital Stay (HOSPITAL_BASED_OUTPATIENT_CLINIC_OR_DEPARTMENT_OTHER): Payer: BC Managed Care – PPO | Admitting: Oncology

## 2021-07-17 ENCOUNTER — Inpatient Hospital Stay: Payer: BC Managed Care – PPO

## 2021-07-17 ENCOUNTER — Inpatient Hospital Stay: Payer: BC Managed Care – PPO | Attending: Oncology

## 2021-07-17 VITALS — BP 112/74 | HR 95 | Temp 98.2°F | Resp 18 | Ht 64.5 in | Wt 142.4 lb

## 2021-07-17 DIAGNOSIS — Z79899 Other long term (current) drug therapy: Secondary | ICD-10-CM | POA: Diagnosis not present

## 2021-07-17 DIAGNOSIS — C787 Secondary malignant neoplasm of liver and intrahepatic bile duct: Secondary | ICD-10-CM | POA: Insufficient documentation

## 2021-07-17 DIAGNOSIS — R188 Other ascites: Secondary | ICD-10-CM | POA: Diagnosis not present

## 2021-07-17 DIAGNOSIS — Z452 Encounter for adjustment and management of vascular access device: Secondary | ICD-10-CM | POA: Insufficient documentation

## 2021-07-17 DIAGNOSIS — C189 Malignant neoplasm of colon, unspecified: Secondary | ICD-10-CM

## 2021-07-17 DIAGNOSIS — Z95828 Presence of other vascular implants and grafts: Secondary | ICD-10-CM

## 2021-07-17 DIAGNOSIS — C182 Malignant neoplasm of ascending colon: Secondary | ICD-10-CM | POA: Diagnosis present

## 2021-07-17 LAB — CBC WITH DIFFERENTIAL (CANCER CENTER ONLY)
Abs Immature Granulocytes: 0.01 10*3/uL (ref 0.00–0.07)
Basophils Absolute: 0 10*3/uL (ref 0.0–0.1)
Basophils Relative: 0 %
Eosinophils Absolute: 0 10*3/uL (ref 0.0–0.5)
Eosinophils Relative: 1 %
HCT: 30.3 % — ABNORMAL LOW (ref 36.0–46.0)
Hemoglobin: 9.7 g/dL — ABNORMAL LOW (ref 12.0–15.0)
Immature Granulocytes: 0 %
Lymphocytes Relative: 19 %
Lymphs Abs: 0.5 10*3/uL — ABNORMAL LOW (ref 0.7–4.0)
MCH: 28 pg (ref 26.0–34.0)
MCHC: 32 g/dL (ref 30.0–36.0)
MCV: 87.6 fL (ref 80.0–100.0)
Monocytes Absolute: 0.2 10*3/uL (ref 0.1–1.0)
Monocytes Relative: 9 %
Neutro Abs: 1.9 10*3/uL (ref 1.7–7.7)
Neutrophils Relative %: 71 %
Platelet Count: 212 10*3/uL (ref 150–400)
RBC: 3.46 MIL/uL — ABNORMAL LOW (ref 3.87–5.11)
RDW: 20.3 % — ABNORMAL HIGH (ref 11.5–15.5)
WBC Count: 2.7 10*3/uL — ABNORMAL LOW (ref 4.0–10.5)
nRBC: 0 % (ref 0.0–0.2)

## 2021-07-17 LAB — CMP (CANCER CENTER ONLY)
ALT: 13 U/L (ref 0–44)
AST: 24 U/L (ref 15–41)
Albumin: 3.4 g/dL — ABNORMAL LOW (ref 3.5–5.0)
Alkaline Phosphatase: 135 U/L — ABNORMAL HIGH (ref 38–126)
Anion gap: 13 (ref 5–15)
BUN: 13 mg/dL (ref 6–20)
CO2: 23 mmol/L (ref 22–32)
Calcium: 10.3 mg/dL (ref 8.9–10.3)
Chloride: 94 mmol/L — ABNORMAL LOW (ref 98–111)
Creatinine: 0.61 mg/dL (ref 0.44–1.00)
GFR, Estimated: >60 mL/min (ref >60)
Glucose, Bld: 285 mg/dL — ABNORMAL HIGH (ref 70–99)
Potassium: 3.9 mmol/L (ref 3.5–5.1)
Sodium: 130 mmol/L — ABNORMAL LOW (ref 135–145)
Total Bilirubin: 1.5 mg/dL — ABNORMAL HIGH (ref 0.3–1.2)
Total Protein: 8.6 g/dL — ABNORMAL HIGH (ref 6.5–8.1)

## 2021-07-17 LAB — MAGNESIUM: Magnesium: 1.7 mg/dL (ref 1.7–2.4)

## 2021-07-17 MED ORDER — HEPARIN SOD (PORK) LOCK FLUSH 100 UNIT/ML IV SOLN
500.0000 [IU] | Freq: Once | INTRAVENOUS | Status: AC
  Administered 2021-07-17: 500 [IU]

## 2021-07-17 MED ORDER — SODIUM CHLORIDE 0.9% FLUSH
10.0000 mL | Freq: Once | INTRAVENOUS | Status: AC
  Administered 2021-07-17: 10 mL

## 2021-07-17 NOTE — Progress Notes (Signed)
[facility] OFFICE PROGRESS NOTE   Diagnosis: Colon cancer  INTERVAL HISTORY:   Ms. Cullifer completed cycle of [place] beginning 06/26/2021.  She tolerated Lonsurf well.  She reports malaise.  She continues to have abdominal pain.  She developed right flank discomfort beginning last night.  She takes oxycodone as needed for pain.  She would like to take lorazepam for anxiety.  She had a recent episode of rectal bleeding and was evaluated by gastroenterology.  Objective:  Vital signs in last 24 hours:  Blood pressure 112/74, pulse 95, temperature 98.2 F (36.8 C), temperature source Oral, resp. rate 18, height 5' 4.5" (1.638 m), weight 142 lb 6.4 oz (64.6 kg), SpO2 98 %.    HEENT: Geographic tongue, no thrush or ulcers Resp: Decreased breath sounds at the right lower chest, no respiratory distress Cardio: Regular rate and rhythm GI: Mildly distended, nontender, no mass Vascular: No leg edema Musculoskeletal: No tenderness at the right flank  Portacath/PICC-without erythema  Lab Results:  Lab Results  Component Value Date   WBC 2.7 (L) 07/17/2021   HGB 9.7 (L) 07/17/2021   HCT 30.3 (L) 07/17/2021   MCV 87.6 07/17/2021   PLT 212 07/17/2021   NEUTROABS 1.9 07/17/2021    CMP  Lab Results  Component Value Date   NA 130 (L) 07/17/2021   K 3.9 07/17/2021   CL 94 (L) 07/17/2021   CO2 23 07/17/2021   GLUCOSE 285 (H) 07/17/2021   BUN 13 07/17/2021   CREATININE 0.61 07/17/2021   CALCIUM 10.3 07/17/2021   PROT 8.6 (H) 07/17/2021   ALBUMIN 3.4 (L) 07/17/2021   AST 24 07/17/2021   ALT 13 07/17/2021   ALKPHOS 135 (H) 07/17/2021   BILITOT 1.5 (H) 07/17/2021   GFRNONAA >60 07/17/2021   GFRAA >60 11/05/2019    Lab Results  Component Value Date   CEA1 43.43 (H) 06/30/2020   CEA 39.62 (H) 06/05/2021   CA125 14 11/26/2014    Medications: I have reviewed the patient's current medications.   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 The tumor returned microsatellite stable with no loss of mismatch repair protein expression   APC mutated. No BRAF, KRAS, or NRAS mutation On Foundation 1 testing   Cycle 1 adjuvant FOLFOX 09/08/2013   Cycle 2 adjuvant FOLFOX 09/24/2013   Cycle 3 adjuvant FOLFOX 10/08/2013.   Cycle 4 adjuvant FOLFOX 10/22/2013.   Cycle 5 adjuvant FOLFOX 11/05/2013. Oxaliplatin held due to thrombocytopenia. Cycle 6 FOLFOX 11/19/2013. Cycle 7 FOLFOX 12/03/2013. Oxaliplatin held secondary to thrombocytopenia. Cycle 8 FOLFOX 12/17/2013. Cycle 9 FOLFOX 01/04/2014. Oxaliplatin held secondary to neutropenia.   Cycle 10 FOLFOX 01/21/2014. Oxaliplatin held secondary to thrombocytopenia. Cycle 11 FOLFOX 02/04/2014 Cycle 12 FOLFOX 02/18/2014, oxaliplatin dose reduced secondary tothrombocytopenia CT abdomen/pelvis 01/30/2014 revealed splenomegaly and no evidence of recurrent colon cancer CT chest 04/07/2014 with a stable right lower lobe nodule and no evidence for metastatic disease, no nodules seen on the CT 11/26/2014 Markedly elevated CEA 11/24/2014 CT 11/26/2014 revealed a right pelvic mass, splenomegaly, small volume ascites Right salpingo-oophorectomy 12/28/2014 with the pathology confirming metastatic colon cancer CTs 03/23/2016-no evidence of recurrent or metastatic disease. CT 11/26/2016-enlargement of a fluid density structure the right pelvic sidewall, no other evidence of metastatic disease CT aspiration right pelvic cyst 12/19/2016.  Cytology-BENIGN REACTIVE/REPARATIVE CHANGES. CTs 06/05/2017- no evidence of metastatic disease, mild cirrhotic changes with splenomegaly CTs 12/11/2017- recurrent cystic right adnexal mass,  similar to on the CT 11/26/2016, stable mild splenomegaly CTs 04/23/2018- enlargement of cystic right adnexal mass with mural nodularity, no other evidence of metastatic disease Cytoreductive  surgery/HIPEC with mitomycin by Dr. Clovis Riley at [facility] 08/12/2018-R1 resection achieved.  Cytoreduction included omentectomy, LAR, right salpingo-oophorectomy and left colonic gutter/pelvic stripping.  Pathology on the rectum showed recurrent/metastatic adenocarcinoma, tumor 2.0 cm, predominantly involving the subserosa and muscularis propria of the colon, proximal and distal margins of resection were negative, vascular invasion present, metastatic carcinoma present in 1 out of 5 lymph nodes; omentum resection with no malignancy seen, no metastatic carcinoma identified in 1 lymph node examined; left gutter stripping positive metastatic adenocarcinoma; right ovary resection positive metastatic adenocarcinoma. 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 CEA 68 02/03/2019 Biopsy liver lesion 02/11/2019-metastatic adenocarcinoma consistent with primary colonic adenocarcinoma CTs 02/27/2019-multiple liver lesions increased in size, new lesion in the lateral right lobe of the liver.  No evidence of metastatic disease in the chest.  Redemonstrated moderate left hydronephrosis and proximal hydroureter without discrete lesion or obstructing etiology at the transition point of the mid ureter. Cycle 1 FOLFIRI/Panitumumab 03/12/2019 Cycle 2 FOLFIRI/Panitumumab 03/26/2019-bolus 5-FU and irinotecan held secondary to neutropenia Cycle 3 FOLFIRI/Panitumumab 04/09/2019, Udenyca added-not given secondary to seizure/discontinuation of the 5-FU pump CT abdomen[facility]pelvis at [facility] 05/04/2019-no residual fluid collection at the left abdominal wall abscess, stable moderate left hydronephrosis, multifocal indeterminate liver lesions--liver lesions significantly improved Cycle 4 FOLFIRI/Panitumumab 05/07/2019, Udenyca Cycle 5 FOLFIRI/Panitumumab 05/20/2019, Udenyca Cycle 6 FOLFIRI/Panitumumab 06/18/2019, Udenyca Cycle 7 FOLFIRI/Panitumumab 07/01/2019 (Irinotecan dose  reduced due to thrombocytopenia), Udenyca--Udenyca was not given Cycle 8 FOLFIRI/Panitumumab 07/15/2019, Udenyca Cycle 9 FOLFIRI/Panitumumab 07/29/2019, Udenyca Cycle 10 FOLFIRI/Panitumumab 08/13/2019, Udenyca CTs at [facility] 08/19/2019-decreased size of the hypodense and hyperdense lesions within segment 2 of the left hepatic lobe.  No new lesions identified.  Similar presacral soft tissue thickening with adjacent stable alignment in the rectum.  Improved but persistent left UPJ obstruction with mild left hydronephrosis.  Persistent but decreased size of the wound within the left mid abdomen abdominal wall. Cycle 11 irinotecan/Panitumumab 08/27/2019, Udenyca Cycle 12 irinotecan/Panitumumab 10/08/2019, Udenyca Cycle 13 irinotecan/Panitumumab 11/05/2019, Udenyca CT abdomen/pelvis 11/19/2019-no new or progressive interval findings.  Stable appearance of the calcified lesion in the anterior left hepatic dome with second tiny low-density lesion in the dome of the lateral segment left liver.  Both lesions are markedly decreased since 02/27/2019.  Stable mild fullness left intrarenal collecting system and renal pelvis.  Similar appearance of abnormal soft tissue in the left paramidline subcutaneous fat with associated skin thickening, this likely correlates to the fistula. Cycle 14 irinotecan/Panitumumab 11/19/2019, Udenyca Cycle 15 irinotecan/Panitumumab 12/02/2019, Udenyca CT abdomen[facility]pelvis at [facility] 02/11/2020- similar to slight decrease in segment 2 hypodense and hyperdense lesions (compared to a CT from July 2020), no new lesions, splenomegaly, no fluid collection or abscess, mild stranding adjacent to the incision with overlying skin thickening Cycle 16 irinotecan/Panitumumab 03/03/2020, Udenyca Cycle 17 irinotecan/Panitumumab 03/17/2020, Udenyca Cycle 18 irinotecan/panitumumab 04/07/2020, Udenyca Cycle 19 irinotecan/Panitumumab 04/28/2020, Udenyca Cycle 20 irinotecan/panitumumab 05/26/2020, Udenyca Cycle 21  irinotecan/panitumumab 06/30/2020, Udenyca CT abdomen/pelvis 07/19/2020-mildly increased hepatic steatosis.  Increased size of 2.8 cm hypervascular lesion segment 2 left hepatic lobe. Cycle 22 irinotecan/Panitumumab 07/21/2020, Udenyca MRI Abd 07/28/2020: liver lesions consistent with hepatic metastasis Cycle 23 irinotecan/Panitumumab 08/04/2020, Udenyca  Cycle 24 irinotecan/panitumumab 08/22/2020 Irinotecan/panitumumab discontinued secondary to diarrhea and skin toxicity Guardant 360 on 09/12/2020-K-ras G12V, PIK3CA,NTRK2-VUS, tumor mutation burden 6.85, MSI-high not detected Referred for  consideration of Y 90 CTs 10/11/2020-for enlarging hepatic metastatic lesions identified.  No new lesions.  Several tiny nodules in the lungs in the 2 to 3 mm diameter range.  Mild splenomegaly.  Mild left hydronephrosis. 11/17/2020-Y90 lateral segment left lobe of the liver 12/08/2020-Y 90 treatment to the right liver CTs 03/16/2021-multiple new small pulmonary nodules.  Response to therapy in the liver, decreasing size of liver lesions with signs of interval radioembolization; findings suspicious for perianastomotic recurrence in the pelvis; new small volume ascites and some right colonic thickening as well as retroperitoneal thickening. Cycle 1 FOLFOX 04/10/2021 Cycle 2 FOLFOX 04/24/2021 CT abdomen/pelvis 05/06/2021-increased ascites, stable hypodense liver lesions, splenomegaly, soft tissue density at the left pelvis-stable or decreased in size by my measurement Cycle 3 FOLFOX 06/05/2021 FOLFOX discontinued secondary to poor tolerance with fever requiring repeat hospitalization Cycle 1 Lonsurf 06/26/2021, not a candidate for bevacizumab secondary to esophageal varices Mild elevation of the CEA beginning January 2016 , normal on 05/19/2014   History of iron deficiency anemia   seizure disorder; seizure 04/09/2019.  Brain CT negative.  Now on Keppra. history of depression   4 mm right lower lobe nodule on a staging chest CT  09/08/2013 , stable on a CT 04/07/2014  Hospitalization 09/24/2013 through 09/26/2013 with fever and abdominal pain.   09/24/2013 urine culture positive for coag negative staph.   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  Scope advanced for approximately 25 cm.  Most proximal portion had necrotic appearing debris.  Colostomy bag insufflated suggesting some type of communication between the pouch and the proximal colon.  Introduction of scope into the ostomy found to available directions.  1 toward the distal pouch with similar appearing necrotic debris encountered.  The other was about a 30 cm segment of normal-appearing colonic mucosa to the level of the previous right hemicolectomy ileocolonic anastomosis. Port-A-Cath placement interventional radiology 03/05/2019 Left leg/foot weakness-brain MRI 09/03/2019 with no evidence of metastatic disease, referred to physical therapy Possible abdominal wall abscess status post evaluation by surgery at [facility] 04/03/2019, antibiotics initiated; incision and drainage with purulent material removed 04/22/2019 CT at [facility] 05/04/2019-no fluid collection at the site of the left abdominal wall abscess, "indeterminate" liver lesions CT at 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  No malignancy.   16. COVID-19 + 09/10/2019 17. Hospitalized with seizure 09/17/2019 through 09/19/2019-MRI brain without evidence of metastatic disease. Seen by neurology. Keppra dose increased. 19.  Rash secondary to Panitumumab 20.  COVID-19  09/22/2020 21.  Admission 04/24/2021 with a fever-no source for infection identified 22.  Ascites-paracentesis, cytology 05/09/2021, 05/15/2021, 05/30/2021-negative 23.  Admission 06/06/2021 with fever-no source for infection identified 24.  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  Stomach with diffuse portal hypertensive gastropathy.  Mild contact friability.      Disposition: Ms. Bise has metastatic colon cancer.  She completed 1 cycle of Lonsurf beginning 06/26/2021.  She tolerated Lonsurf well.  She will return for a CBC on 07/24/2021 with the plan to begin cycle 2 Lonsurf that day. We discussed the prognosis and additional systemic treatment options.  We discussed clinical trials.  She does not wish to consider clinical trial.  We discussed the expected prognosis of months.  She will continue oxycodone as needed for pain.  She will use Ativan as needed for insomnia and anxiety.  She will discontinue hydroxyzine and Benadryl.  She does not wish to begin an antidepressant.  Betsy Coder, MD  07/17/2021  12:08 PM

## 2021-07-17 NOTE — Patient Instructions (Signed)

## 2021-07-19 ENCOUNTER — Telehealth: Payer: Self-pay

## 2021-07-19 ENCOUNTER — Other Ambulatory Visit: Payer: Self-pay

## 2021-07-19 ENCOUNTER — Other Ambulatory Visit: Payer: Self-pay | Admitting: *Deleted

## 2021-07-19 ENCOUNTER — Encounter: Payer: Self-pay | Admitting: Oncology

## 2021-07-19 ENCOUNTER — Encounter: Payer: Self-pay | Admitting: Licensed Clinical Social Worker

## 2021-07-19 ENCOUNTER — Other Ambulatory Visit: Payer: Self-pay | Admitting: Nurse Practitioner

## 2021-07-19 DIAGNOSIS — C189 Malignant neoplasm of colon, unspecified: Secondary | ICD-10-CM

## 2021-07-19 MED ORDER — OXYCODONE HCL 5 MG PO TABS: 5.0000 mg | ORAL_TABLET | Freq: Four times a day (QID) | ORAL | 0 refills | Status: DC | PRN

## 2021-07-19 NOTE — Progress Notes (Signed)
[facility] CSW Progress Note  Clinical Education officer, museum contacted patient by phone to discuss adjustment concerns during treatment. Patient was not available to talk for an extended period of time, but did agree to schedule appointment with CSW.  CSW updated patient, another CSW will be covering at [facility] next week, and patient agreed to meet with patient the [place] on Tuesday 6/20 at 11:00AM..    Adelene Amas, LCSW

## 2021-07-19 NOTE — Telephone Encounter (Signed)
Patient requested a refill of her Oxy, the requested was placed on the Np's desk

## 2021-07-20 ENCOUNTER — Other Ambulatory Visit: Payer: BC Managed Care – PPO

## 2021-07-20 ENCOUNTER — Inpatient Hospital Stay: Payer: BC Managed Care – PPO

## 2021-07-20 ENCOUNTER — Other Ambulatory Visit (HOSPITAL_COMMUNITY): Payer: Self-pay

## 2021-07-20 NOTE — Patient Outreach (Signed)
**Note Emily-Identified via Obfuscation** [facility] ([facility]) Care Management Geriatric Nurse Practitioner Note   07/20/2021 Name:  [name] MRN:  [id number] DOB:  06/09/1975  Summary: Stage IV Colon Ca with mets.  Recommendations/Changes made from today's visit: Will follow pt closely. Consider Hospice referral.  Subjective: [name] is an 46 y.o. year old female who is a primary patient of Emily Cuba, Raymond J, MD. The care management team was consulted for assistance with care management and/or care coordination needs.    Geriatric Nurse Practitioner completed Telephone Visit today.   Outpatient Encounter Medications as of 07/19/2021  Medication Sig   Accu-Chek Softclix Lancets lancets SMARTSIG:2 Topical Twice Daily   acetaminophen (TYLENOL) 500 MG tablet Take 1,000 mg by mouth every 6 (six) hours as needed for mild pain.   blood glucose meter kit and supplies Dispense based on patient and insurance preference. Use up to four times daily as directed. (FOR ICD-10 E10.9, E11.9).   diphenhydrAMINE (BENADRYL) 25 mg capsule Take 25 mg by mouth at bedtime.   docusate sodium (COLACE) 100 MG capsule Take 1 capsule (100 mg total) by mouth every 12 (twelve) hours.   furosemide (LASIX) 20 MG tablet Take 1 tablet (20 mg total) by mouth daily.   hydrOXYzine (ATARAX) 25 MG tablet Take 1 tablet (25 mg total) by mouth at bedtime.   lacosamide (VIMPAT) 200 MG TABS tablet Take 1 tablet by mouth twice daily (Patient taking differently: Take 200 mg by mouth 2 (two) times daily.)   loperamide (IMODIUM) 2 MG capsule Take 2 mg by mouth as needed for diarrhea or loose stools.   LORazepam (ATIVAN) 0.5 MG tablet Take 1 tablet by mouth twice daily as needed for anxiety (Patient not taking: Reported on 07/17/2021)   magnesium oxide (MAG-OX) 400 (240 Mg) MG tablet Take 1 tablet (400 mg total) by mouth 2 (two) times daily.   metFORMIN (GLUCOPHAGE) 500 MG tablet TAKE 1 TABLET BY MOUTH TWICE DAILY WITH A MEAL   pantoprazole (PROTONIX) 40  MG tablet Take 1 tablet (40 mg total) by mouth 2 (two) times daily.   potassium chloride (MICRO-K) 10 MEQ CR capsule Take 1 capsule (10 mEq total) by mouth daily.   prochlorperazine (COMPAZINE) 10 MG tablet Take 1 tablet (10 mg total) by mouth every 6 (six) hours as needed for nausea or vomiting.   spironolactone (ALDACTONE) 50 MG tablet Take 1 tablet (50 mg total) by mouth daily.   trifluridine-tipiracil (LONSURF) 15-6.14 MG tablet Take 3 tablets (45 mg of trifluridine total) by mouth 2 (two) times daily after a meal. Take within 1 hr of AM & PM meal. Take on days 1-5, 8-12. Repeat every 28 day.Start cycle on 07/24/21   [DISCONTINUED] oxyCODONE (ROXICODONE) 5 MG immediate release tablet Take 1-2 tablets (5-10 mg total) by mouth every 6 (six) hours as needed for severe pain.   No facility-administered encounter medications on file as of 07/19/2021.   Care Plan  Review of patient past medical history, allergies, medications, health status, including review of consultants reports, laboratory and other test data, was performed as part of comprehensive evaluation for care management services.   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   Start Date: 07/12/2021  Expected End Date: 07/19/2021  This Visit's Progress: On track  Priority: High  Note:    Update for 07/19/21:  (Status: Goal Met.) Short Term Goal  Evaluation of current treatment plan related to MAKING HER REQUEST LIST and patient's adherence to plan as established by provider Mrs. Hockenbury has indeed made her list. She and her husband will be asking the others in the home to contribute to the mortgage, electric, cable bills. Also requesting that the  son be respectful of those sleeping when he comes in late and watches TV or listens to music. Suggested that he use headphones or ear buds to eliminate those sounds for others. Suggested that Mr. Sandner advise the others that Mrs. Schubring has been told that she only has months to live and out of respect for her and her quality of life they need to participate in helpful ways.  Current Barriers:  Pt unable to advocate for herself, needs husband's assistance.  RNCM Clinical Goal(s):  Patient will work with Education officer, museum to address [place] Concerns , Family and relationship dysfunction, and Limited education about disease trajectory, life expectancy* related to the management of metastatic colon ca as evidenced by review of EMR and patient or social worker report     demonstrate a decrease in depression, stress exacerbations regarding her health and family relationships as evidenced by pt report  through collaboration with Consulting civil engineer, provider, and care team.   Interventions: Inter-disciplinary care team collaboration (see longitudinal plan of care) Evaluation of current treatment plan related to  self management and patient's adherence to plan as established by provider  Patient Goals/Self-Care Activities: Attend church or other social activities Call provider office for new concerns or questions  Work with the social worker to address care coordination needs and will continue to work with the clinical team to address health care and disease management related needs      Goal: Patient and husband to plan a family meeting to discuss pt needs and healthy co-habitation.   Start Date: 07/12/2021  Expected End Date: 08/11/2021  This Visit's Progress: On track  Priority: Medium  Note:    Update 07/19/21:  (Status: Goal on Track (progressing): YES.) Short Term Goal  Evaluation of current treatment plan related to [place] and patient's adherence to plan as established by provider NP  talked to Mr. Swango last week and Mrs. Gassner reports she and her husband have talked and they will be having a family meeting to discuss peaceful co-habitation and contributing to the household expenses (mortgage, IT trainer, cable).   Current Barriers:  Feels she is unable to advocate as other family members are sister-in-law and her son. Need husband involvement.  RNCM Clinical Goal(s):  Patient will work with Education officer, museum to address [place] Concerns  and Family and relationship dysfunction related to the management of Healthy Co-habitation as evidenced by review of EMR and patient or social worker report     through collaboration with Consulting civil engineer, provider, and care team.   Interventions: Inter-disciplinary care team collaboration (see longitudinal plan of care) Evaluation of current treatment plan related to  self management and patient's adherence to plan as established by provider  Patient Goals/Self-Care Activities: Work with the social worker to address care coordination needs and will continue to work with the clinical team to address health care and disease management related needs    Long-Range Goal: Patient will  report improved mood and decreased stress over the next 90 days per her report.   Start Date: 07/07/2021  Expected End Date: 10/13/2021  Recent Progress: Not on track  Priority: High  Note:    Update 07/20/21:  (Status: Goal on Track (progressing): YES.) Long Term Goal  Evaluation of current treatment plan related to STRESS REDUCTION and patient's adherence to plan as established by provider Pt has been informed she has months to live so she and her husband are trying to come to terms with this news. She is at least glad to know her prognosis. Dr. Sherrill did discuss her medical regimen and they decided not her current regimen is more beneficial for her that to start an antidepressant. She has not talked with the oncology social worker. She has been notified that she  will be getting Medicaid. NP asked if the pt was familiar with HOSPICE services and she was not. NP described services and encouraged her to accept them when her doctor feels it is appropriate. Will message Dr. Sherrill to consider on her next visit  Update 07/12/21:  (Status: Goal on track: NO.) Long Term Goal  Evaluation of current treatment plan related to DEPRESSION and patient's adherence to plan as established by provider Long conversation with pt regarding her current state of mind. She admits to being very depressed and just as stressed as ever. She says she is having crying episodes, just being overwhelmed with her worries. She has a lot on her mind. She is not sure what her disease trajectory is or life expectancy. She has been making funeral plans but the expenses are going to be over what they could reasonably afford, her insurance is expiring and she has not been approved for Medicaid yet, she is waiting on word about this. She has called the CSW in charge of this but has not received call backs.        1) NP has messaged oncologist and LCSW in their office to request assistance with medication (antidepressant Rx) and counseling to answer some of these questions to alleviate some of the things she worries about.         2) NP has advised will reach out to Medicaid to try to get an update on her status. NP has done this today, left a message for Stephanie Hamm.        3) Requested that pt make a list of things that her sister-in-law and nephew could do to assist with household bills and consideration of pt health status. Plan a family meeting within the next month (new goals).        4) NP to talk with her husband about this and request his assistance and advocacy for his wife to improve her quality of life.        5)pt to make a list of things she wants to do before she cannot do them. She wants to go to [place] again where she and her husband went for their honeymoon. NP to put out notice  request for anyone that has or knows someone that has a vacation spot there that they could rent cheaply or consider free stay. Get your mani/pedi's every 2 weeks.        6) Be mindful to banish worries that you cannot do anything about from your mind. Have some distraction activities to take your mind off of these things: Read good book, reminisce on good memories, take a walk, call a friend that makes   you laugh....  Current Barriers:  Care Coordination needs related to Mental Health Concerns  and Family and relationship dysfunction   RNCM Clinical Goal(s):  Patient will demonstrate improved health management independence as evidenced by pt report        continue to work with RN Care Manager and/or Social Worker to address care management and care coordination needs related to Depression and Stress as evidenced by adherence to CM Team Scheduled appointments     work with Education officer, museum to address [place] Concerns  related to the management of Depression and Stress as evidenced by review of EMR and patient or Education officer, museum report     through collaboration with Consulting civil engineer, provider, and care team.   Interventions: Inter-disciplinary care team collaboration (see longitudinal plan of care) Evaluation of current treatment plan related to  self management and patient's adherence to plan as established by provider  Patient Goals/Self-Care Activities: Take medications as prescribed   Attend all scheduled provider appointments Call pharmacy for medication refills 3-7 days in advance of running out of medications Perform all self care activities independently  Call provider office for new concerns or questions  Work with the social worker to address care coordination needs and will continue to work with the clinical team to address health care and disease management related needs        Plan: Telephone follow up appointment with care management team member scheduled for:  one week on Wed,  June   [place]. Myrtie Neither, MSN, [facility] Gerontological Nurse Practitioner [facility] Care Management [phone]

## 2021-07-21 ENCOUNTER — Other Ambulatory Visit: Payer: Self-pay | Admitting: Nurse Practitioner

## 2021-07-21 DIAGNOSIS — C189 Malignant neoplasm of colon, unspecified: Secondary | ICD-10-CM

## 2021-07-22 ENCOUNTER — Other Ambulatory Visit: Payer: Self-pay | Admitting: Nurse Practitioner

## 2021-07-22 DIAGNOSIS — C189 Malignant neoplasm of colon, unspecified: Secondary | ICD-10-CM

## 2021-07-24 ENCOUNTER — Other Ambulatory Visit: Payer: Self-pay

## 2021-07-24 ENCOUNTER — Inpatient Hospital Stay: Payer: BC Managed Care – PPO

## 2021-07-24 VITALS — BP 104/80 | HR 88 | Temp 98.1°F | Resp 18

## 2021-07-24 DIAGNOSIS — C182 Malignant neoplasm of ascending colon: Secondary | ICD-10-CM | POA: Diagnosis not present

## 2021-07-24 DIAGNOSIS — Z95828 Presence of other vascular implants and grafts: Secondary | ICD-10-CM

## 2021-07-24 DIAGNOSIS — C189 Malignant neoplasm of colon, unspecified: Secondary | ICD-10-CM

## 2021-07-24 LAB — CBC WITH DIFFERENTIAL (CANCER CENTER ONLY)
Abs Immature Granulocytes: 0.01 10*3/uL (ref 0.00–0.07)
Basophils Absolute: 0 10*3/uL (ref 0.0–0.1)
Basophils Relative: 0 %
Eosinophils Absolute: 0 10*3/uL (ref 0.0–0.5)
Eosinophils Relative: 2 %
HCT: 31.9 % — ABNORMAL LOW (ref 36.0–46.0)
Hemoglobin: 10.2 g/dL — ABNORMAL LOW (ref 12.0–15.0)
Immature Granulocytes: 0 %
Lymphocytes Relative: 28 %
Lymphs Abs: 0.6 10*3/uL — ABNORMAL LOW (ref 0.7–4.0)
MCH: 27.9 pg (ref 26.0–34.0)
MCHC: 32 g/dL (ref 30.0–36.0)
MCV: 87.2 fL (ref 80.0–100.0)
Monocytes Absolute: 0.2 10*3/uL (ref 0.1–1.0)
Monocytes Relative: 10 %
Neutro Abs: 1.4 10*3/uL — ABNORMAL LOW (ref 1.7–7.7)
Neutrophils Relative %: 60 %
Platelet Count: 196 10*3/uL (ref 150–400)
RBC: 3.66 MIL/uL — ABNORMAL LOW (ref 3.87–5.11)
RDW: 19.9 % — ABNORMAL HIGH (ref 11.5–15.5)
WBC Count: 2.3 10*3/uL — ABNORMAL LOW (ref 4.0–10.5)
nRBC: 0 % (ref 0.0–0.2)

## 2021-07-24 MED ORDER — SODIUM CHLORIDE 0.9% FLUSH
10.0000 mL | Freq: Once | INTRAVENOUS | Status: AC
  Administered 2021-07-24: 10 mL

## 2021-07-24 MED ORDER — HEPARIN SOD (PORK) LOCK FLUSH 100 UNIT/ML IV SOLN
500.0000 [IU] | Freq: Once | INTRAVENOUS | Status: AC
  Administered 2021-07-24: 500 [IU]

## 2021-07-24 NOTE — Patient Instructions (Signed)

## 2021-07-25 ENCOUNTER — Other Ambulatory Visit: Payer: Self-pay

## 2021-07-25 ENCOUNTER — Inpatient Hospital Stay: Payer: BC Managed Care – PPO

## 2021-07-25 DIAGNOSIS — C189 Malignant neoplasm of colon, unspecified: Secondary | ICD-10-CM

## 2021-07-25 NOTE — Progress Notes (Signed)
[place] Clinical Social Work  Initial Assessment   ANAVICTORIA WILK is a 46 y.o. year old female presenting alone. Clinical Social Work was referred by nurse navigator for assessment of psychosocial needs.   SDOH (Social Determinants of Health) assessments performed: Yes SDOH Interventions    Flowsheet Row Most Recent Value  SDOH Interventions   Financial Strain Interventions Financial Counselor  Housing Interventions Intervention Not Indicated  Transportation Interventions Intervention Not Indicated       SDOH Screenings   Alcohol Screen: Not on file  Depression (PHQ2-9): Low Risk  (07/13/2021)   Depression (PHQ2-9)    PHQ-2 Score: 0  Recent Concern: Depression (PHQ2-9) - Medium Risk (07/05/2021)   Depression (PHQ2-9)    PHQ-2 Score: 9  Financial Resource Strain: Medium Risk (07/25/2021)   Overall Financial Resource Strain (CARDIA)    Difficulty of Paying Living Expenses: Somewhat hard  Food Insecurity: No Food Insecurity (07/25/2021)   Hunger Vital Sign    Worried About Running Out of Food in the Last Year: Never true    Ran Out of Food in the Last Year: Never true  Housing: Low Risk  (07/25/2021)   Housing    Last Housing Risk Score: 0  Physical Activity: Not on file  Social Connections: Not on file  Stress: Stress Concern Present (07/05/2021)   [place]    Feeling of Stress : Very much  Tobacco Use: Low Risk  (07/13/2021)   Patient History    Smoking Tobacco Use: Never    Smokeless Tobacco Use: Never    Passive Exposure: Not on file  Transportation Needs: No Transportation Needs (07/25/2021)   PRAPARE - Transportation    Lack of Transportation (Medical): No    Lack of Transportation (Non-Medical): No     Distress Screen completed: No    09/03/2013    3:45 PM  ONCBCN DISTRESS SCREENING  Screening Type Initial Screening  Distress experienced in past week (1-10) 7  Emotional problem type  Nervousness/Anxiety;Adjusting to illness  Physical Problem type Sleep/insomnia;Skin dry/itchy  Referral to clinical social work Yes  Other most distressing --sleep and adjusting to treatment; best way to contact --leave message on home phone      Family/Social Information:  Housing Arrangement: patient lives with her husband, Gwyndolyn Saxon, his son, and his sister.   Her husband works full-time.  Her sister-in-law and step-son are not contributing financially. Family members/support persons in your life? Family and Friends  Patient's daughter, Rolla Plate, lives in [place], and her son, Einar Pheasant, lives in [place], [place]. Transportation concerns: no.  Patient's two sisters provide transportation. 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  Income source: Supported by [facility] and Friends Financial concerns: Yes, due to illness and/or loss of work during treatment.  She has been approved for Medicaid, which will begin in July.  She has applied for disability which will begin in October. Type of concern: Utilities and Medical bills.  She has been late paying utilities. Food access concerns: no Religious or spiritual practice: Yes-Patient welcomes religious support. Services Currently in place:  None.  Coping/ Adjustment to diagnosis: Patient understands treatment plan and what happens next? yes Concerns about diagnosis and/or treatment: Losing my job and/or losing income Patient reported stressors: Publishing rights manager, Food, Anxiety/ nervousness, and Adjusting to my illness Hopes and/or priorities: Her relationship with her husband is what brings her joy. Patient enjoys watching  TV and time with family/ friends Current coping skills/ strengths: Armed forces logistics/support/administrative officer , General fund of knowledge , and Supportive family/friends     SUMMARY: Current SDOH Barriers:  Financial constraints related to loss of income due to illness.  Clinical  Social Work Clinical Goal(s):  Patient will work with SW to address concerns related to Regulatory affairs officer.  Interventions: Discussed common feeling and emotions when being diagnosed with cancer, and the importance of support during treatment Informed patient of the support team roles and support services at [facility] Provided CSW contact information and encouraged patient to call with any questions or concerns Referred patient to Financial Counselor and Provided education and assistance to patient regarding Advance Directives   Follow Up Plan: CSW will follow-up with patient by phone  Patient verbalizes understanding of plan: Yes    Rodman Pickle Dalaina Tates, LCSW

## 2021-07-26 ENCOUNTER — Encounter: Payer: Self-pay | Admitting: Oncology

## 2021-07-26 ENCOUNTER — Other Ambulatory Visit: Payer: Self-pay | Admitting: *Deleted

## 2021-07-26 NOTE — Patient Outreach (Signed)
[facility]) Care Management Geriatric Nurse Practitioner Note   07/26/2021 Name:  [name] MRN:  [id number] DOB:  03/18/1975  Summary: Improved mood!  Recommendations/Changes made from today's visit: Continue counseling with LCSW. Have family meeting before we talk again.  Subjective: [name] is an 46 y.o. year old female who is a primary patient of [name], [name], MD. The care management team was consulted for assistance with care management and/or care coordination needs.    Geriatric Nurse Practitioner completed Telephone Visit today.   Outpatient Encounter Medications as of 07/26/2021  Medication Sig   Accu-Chek Softclix Lancets lancets SMARTSIG:2 Topical Twice Daily   acetaminophen (TYLENOL) 500 MG tablet Take 1,000 mg by mouth every 6 (six) hours as needed for mild pain.   blood glucose meter kit and supplies Dispense based on patient and insurance preference. Use up to four times daily as directed. (FOR ICD-10 E10.9, E11.9).   diphenhydrAMINE (BENADRYL) 25 mg capsule Take 25 mg by mouth at bedtime.   docusate sodium (COLACE) 100 MG capsule Take 1 capsule (100 mg total) by mouth every 12 (twelve) hours.   furosemide (LASIX) 20 MG tablet Take 1 tablet (20 mg total) by mouth daily.   hydrOXYzine (ATARAX) 25 MG tablet Take 1 tablet (25 mg total) by mouth at bedtime.   lacosamide (VIMPAT) 200 MG TABS tablet Take 1 tablet by mouth twice daily (Patient taking differently: Take 200 mg by mouth 2 (two) times daily.)   loperamide (IMODIUM) 2 MG capsule Take 2 mg by mouth as needed for diarrhea or loose stools.   LORazepam (ATIVAN) 0.5 MG tablet Take 1 tablet by mouth twice daily as needed for anxiety (Patient not taking: Reported on 07/17/2021)   magnesium oxide (MAG-OX) 400 (240 Mg) MG tablet Take 1 tablet (400 mg total) by mouth 2 (two) times daily.   metFORMIN (GLUCOPHAGE) 500 MG tablet TAKE 1 TABLET BY MOUTH TWICE DAILY WITH A MEAL   oxyCODONE  (ROXICODONE) 5 MG immediate release tablet Take 1-2 tablets (5-10 mg total) by mouth every 6 (six) hours as needed for severe pain.   pantoprazole (PROTONIX) 40 MG tablet Take 1 tablet by mouth twice daily   potassium chloride (MICRO-K) 10 MEQ CR capsule Take 1 capsule (10 mEq total) by mouth daily.   prochlorperazine (COMPAZINE) 10 MG tablet Take 1 tablet (10 mg total) by mouth every 6 (six) hours as needed for nausea or vomiting.   spironolactone (ALDACTONE) 50 MG tablet Take 1 tablet (50 mg total) by mouth daily.   trifluridine-tipiracil (LONSURF) 15-6.14 MG tablet Take 3 tablets (45 mg of trifluridine total) by mouth 2 (two) times daily after a meal. Take within 1 hr of AM & PM meal. Take on days 1-5, 8-12. Repeat every 28 day.Start cycle on 07/24/21   No facility-administered encounter medications on file as of 07/26/2021.     Care Plan  Review of patient past medical history, allergies, medications, health status, including review of consultants reports, laboratory and other test data, was performed as part of comprehensive evaluation for care management services.   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   Start Date: 07/12/2021  Expected End Date: 08/28/2021  Recent Progress: On track  Priority: Medium  Note:    Update 07/26/21:  (Status: Goal on Track (progressing): YES.) Short Term Goal  Evaluation of current treatment plan related to [place] and patient's adherence to plan as established by provider [name] reports she has seen the LCSW, [name] in the oncology office who has talked with her at length and given her some ideas on how to reduce her stress. [name] actually sounds cheerful today which is  very different, [name] commented on the change in her demeanor. She says her husband is giving her a lot of love and tenderness and she is not having to take as much of her antianxiety medication. They have not scheduled a family meeting but [name] feels like she can sent everyone a text that she has news that they need to discuss with everyone. She intends on sharing her prognosis with everyone and requesting changes in the home that would benefit her and everyone in the household. She sounds very strong today. [name] noted her cheerfulness and strength.  Update 07/19/21:  (Status: Goal on Track (progressing): YES.) Short Term Goal  Evaluation of current treatment plan related to [place] and patient's adherence to plan as established by provider [name] talked to [name] last week and [name] reports she and her husband have talked and they will be having a family meeting to discuss peaceful co-habitation and contributing to the household expenses (mortgage, IT trainer, cable).   Current Barriers:  Feels she is unable to advocate as other family members are sister-in-law and her son. Need husband involvement.  RNCM Clinical Goal(s):  Patient will work with Education officer, museum to address [place] Concerns  and Family and relationship dysfunction related to the management of Healthy Co-habitation as evidenced by review of EMR and patient or Education officer, museum report     through collaboration with Consulting civil engineer, provider, and care team.   Interventions: Inter-disciplinary care team collaboration (see longitudinal plan of care) Evaluation of current treatment plan related to  self management and patient's adherence to plan as established by provider  Patient Goals/Self-Care Activities: Work with the social worker to address care coordination needs and will continue to work with the clinical team to address health care and disease management related needs    Long-Range Goal: Patient will report improved mood  and decreased stress over the next 90 days per her report.   Start Date: 07/07/2021  Expected End Date: 10/13/2021  This Visit's Progress: On track  Recent Progress: Not on track  Priority: High  Note:    Update:6 /21/23 (Status: Goal on Track (progressing): YES.) Long Term Goal  Evaluation of current treatment plan related to IMPROVED MOOD/DECREASED STRESS and patient's adherence to plan as established by provider [name] is in a cheerful state of mind today. She reports her meeting with Ms. Duffy, LCSW was very helpful. She was given some good tools to use when under stress. She is using less of her antianxiety medication. Encouraged her to continued to meet with [name] and share ideas with her husband to bring them closer together.  Update 07/20/21:  (Status: Goal on Track (progressing): YES.) Long Term Goal  Evaluation of current treatment plan related to STRESS REDUCTION and patient's adherence to plan as established by provider Pt has been informed she has months to live so she and her husband are trying to come to terms with this news. She is at least  glad to know her prognosis. Dr. Benay Spice did discuss her medical regimen and they decided not her current regimen is more beneficial for her that to start an antidepressant. She has not talked with the oncology social worker. She has been notified that she will be getting Medicaid. [name] asked if the pt was familiar with [facility] services and she was not. [name] described services and encouraged her to accept them when her doctor feels it is appropriate. Will message Dr. Benay Spice to consider on her next visit  Update 07/12/21:  (Status: Goal on track: NO.) Long Term Goal  Evaluation of current treatment plan related to DEPRESSION and patient's adherence to plan as established by provider Long conversation with pt regarding her current state of mind. She admits to being very depressed and just as stressed as ever. She says she is having crying episodes,  just being overwhelmed with her worries. She has a lot on her mind. She is not sure what her disease trajectory is or life expectancy. She has been making funeral plans but the expenses are going to be over what they could reasonably afford, her insurance is expiring and she has not been approved for Medicaid yet, she is waiting on word about this. She has called the CSW in charge of this but has not received call backs.        1) [name] has messaged oncologist and LCSW in their office to request assistance with medication (antidepressant Rx) and counseling to answer some of these questions to alleviate some of the things she worries about.         2) [name] has advised will reach out to Medicaid to try to get an update on her status. [name] has done this today, left a message for [employer].        3) Requested that pt make a list of things that her sister-in-law and nephew could do to assist with household bills and consideration of pt health status. Plan a family meeting within the next month (new goals).        4) [name] to talk with her husband about this and request his assistance and advocacy for his wife to improve her quality of life.        5)pt to make a list of things she wants to do before she cannot do them. She wants to go to [place] again where she and her husband went for their honeymoon. [name] to put out notice request for anyone that has or knows someone that has a vacation spot there that they could rent cheaply or consider free stay. Get your mani/pedi's every 2 weeks.        6) Be mindful to banish worries that you cannot do anything about from your mind. Have some distraction activities to take your mind off of these things: Read good book, reminisce on good memories, take a walk, call a friend that makes you laugh....  Current Barriers:  Care Coordination needs related to Mental Health Concerns  and Family and relationship dysfunction   RNCM Clinical Goal(s):  Patient will demonstrate  improved health management independence as evidenced by pt report        continue to work with RN Care Manager and/or Social Worker to address care management and care coordination needs related to Depression and Stress as evidenced by adherence to CM Team Scheduled appointments     work with social worker to address [place] Concerns  related to the management of  Depression and Stress as evidenced by review of EMR and patient or social worker report     through collaboration with Consulting civil engineer, provider, and care team.   Interventions: Inter-disciplinary care team collaboration (see longitudinal plan of care) Evaluation of current treatment plan related to  self management and patient's adherence to plan as established by provider  Patient Goals/Self-Care Activities: Take medications as prescribed   Attend all scheduled provider appointments Call pharmacy for medication refills 3-7 days in advance of running out of medications Perform all self care activities independently  Call provider office for new concerns or questions  Work with the social worker to address care coordination needs and will continue to work with the clinical team to address health care and disease management related needs        Plan: Telephone follow up appointment with care management team member scheduled for:  July24. Pt agrees with the plan of care.  [name]. Myrtie Neither, MSN, [facility] Gerontological Nurse Practitioner [facility] Care Management [phone]

## 2021-07-28 ENCOUNTER — Other Ambulatory Visit: Payer: Self-pay | Admitting: Oncology

## 2021-07-31 ENCOUNTER — Encounter: Payer: Self-pay | Admitting: Oncology

## 2021-08-03 ENCOUNTER — Inpatient Hospital Stay: Payer: BC Managed Care – PPO

## 2021-08-03 DIAGNOSIS — C182 Malignant neoplasm of ascending colon: Secondary | ICD-10-CM | POA: Diagnosis not present

## 2021-08-03 DIAGNOSIS — C189 Malignant neoplasm of colon, unspecified: Secondary | ICD-10-CM

## 2021-08-03 LAB — CBC WITH DIFFERENTIAL (CANCER CENTER ONLY)
Abs Immature Granulocytes: 0.01 10*3/uL (ref 0.00–0.07)
Basophils Absolute: 0 10*3/uL (ref 0.0–0.1)
Basophils Relative: 0 %
Eosinophils Absolute: 0 10*3/uL (ref 0.0–0.5)
Eosinophils Relative: 1 %
HCT: 29.8 % — ABNORMAL LOW (ref 36.0–46.0)
Hemoglobin: 9.5 g/dL — ABNORMAL LOW (ref 12.0–15.0)
Immature Granulocytes: 1 %
Lymphocytes Relative: 24 %
Lymphs Abs: 0.5 10*3/uL — ABNORMAL LOW (ref 0.7–4.0)
MCH: 28.4 pg (ref 26.0–34.0)
MCHC: 31.9 g/dL (ref 30.0–36.0)
MCV: 89 fL (ref 80.0–100.0)
Monocytes Absolute: 0.1 10*3/uL (ref 0.1–1.0)
Monocytes Relative: 6 %
Neutro Abs: 1.4 10*3/uL — ABNORMAL LOW (ref 1.7–7.7)
Neutrophils Relative %: 68 %
Platelet Count: 129 10*3/uL — ABNORMAL LOW (ref 150–400)
RBC: 3.35 MIL/uL — ABNORMAL LOW (ref 3.87–5.11)
RDW: 18.6 % — ABNORMAL HIGH (ref 11.5–15.5)
WBC Count: 2 10*3/uL — ABNORMAL LOW (ref 4.0–10.5)
nRBC: 0 % (ref 0.0–0.2)

## 2021-08-06 ENCOUNTER — Encounter: Payer: Self-pay | Admitting: Nurse Practitioner

## 2021-08-07 ENCOUNTER — Other Ambulatory Visit: Payer: Self-pay | Admitting: Nurse Practitioner

## 2021-08-07 ENCOUNTER — Other Ambulatory Visit: Payer: Self-pay | Admitting: Oncology

## 2021-08-07 DIAGNOSIS — C182 Malignant neoplasm of ascending colon: Secondary | ICD-10-CM

## 2021-08-07 DIAGNOSIS — C189 Malignant neoplasm of colon, unspecified: Secondary | ICD-10-CM

## 2021-08-07 MED ORDER — OXYCODONE HCL 5 MG PO TABS: 5.0000 mg | ORAL_TABLET | Freq: Four times a day (QID) | ORAL | 0 refills | Status: DC | PRN

## 2021-08-09 ENCOUNTER — Other Ambulatory Visit: Payer: Self-pay

## 2021-08-09 ENCOUNTER — Other Ambulatory Visit: Payer: Self-pay | Admitting: Oncology

## 2021-08-09 ENCOUNTER — Other Ambulatory Visit (HOSPITAL_COMMUNITY): Payer: Self-pay

## 2021-08-09 DIAGNOSIS — C189 Malignant neoplasm of colon, unspecified: Secondary | ICD-10-CM

## 2021-08-09 NOTE — Telephone Encounter (Signed)
Will refill after OV on 08/11/21. Cycle due to start on 7/17

## 2021-08-11 ENCOUNTER — Other Ambulatory Visit (HOSPITAL_COMMUNITY): Payer: Self-pay

## 2021-08-11 ENCOUNTER — Inpatient Hospital Stay: Payer: Medicaid Other | Attending: Oncology | Admitting: Nurse Practitioner

## 2021-08-11 ENCOUNTER — Inpatient Hospital Stay: Payer: Medicaid Other

## 2021-08-11 ENCOUNTER — Encounter: Payer: Self-pay | Admitting: Nurse Practitioner

## 2021-08-11 ENCOUNTER — Encounter: Payer: Self-pay | Admitting: Oncology

## 2021-08-11 ENCOUNTER — Other Ambulatory Visit: Payer: Self-pay | Admitting: Nurse Practitioner

## 2021-08-11 ENCOUNTER — Telehealth: Payer: Self-pay

## 2021-08-11 VITALS — BP 118/71 | HR 65 | Temp 97.9°F | Resp 18 | Ht 64.5 in | Wt 137.0 lb

## 2021-08-11 DIAGNOSIS — D509 Iron deficiency anemia, unspecified: Secondary | ICD-10-CM | POA: Diagnosis not present

## 2021-08-11 DIAGNOSIS — C7961 Secondary malignant neoplasm of right ovary: Secondary | ICD-10-CM | POA: Diagnosis not present

## 2021-08-11 DIAGNOSIS — C182 Malignant neoplasm of ascending colon: Secondary | ICD-10-CM | POA: Diagnosis not present

## 2021-08-11 DIAGNOSIS — C787 Secondary malignant neoplasm of liver and intrahepatic bile duct: Secondary | ICD-10-CM | POA: Insufficient documentation

## 2021-08-11 DIAGNOSIS — Z95828 Presence of other vascular implants and grafts: Secondary | ICD-10-CM

## 2021-08-11 DIAGNOSIS — K766 Portal hypertension: Secondary | ICD-10-CM | POA: Diagnosis not present

## 2021-08-11 DIAGNOSIS — Z452 Encounter for adjustment and management of vascular access device: Secondary | ICD-10-CM | POA: Insufficient documentation

## 2021-08-11 DIAGNOSIS — C189 Malignant neoplasm of colon, unspecified: Secondary | ICD-10-CM

## 2021-08-11 DIAGNOSIS — K3189 Other diseases of stomach and duodenum: Secondary | ICD-10-CM | POA: Diagnosis not present

## 2021-08-11 LAB — CBC WITH DIFFERENTIAL (CANCER CENTER ONLY)
Abs Immature Granulocytes: 0.01 10*3/uL (ref 0.00–0.07)
Basophils Absolute: 0 10*3/uL (ref 0.0–0.1)
Basophils Relative: 0 %
Eosinophils Absolute: 0 10*3/uL (ref 0.0–0.5)
Eosinophils Relative: 1 %
HCT: 27.4 % — ABNORMAL LOW (ref 36.0–46.0)
Hemoglobin: 8.8 g/dL — ABNORMAL LOW (ref 12.0–15.0)
Immature Granulocytes: 1 %
Lymphocytes Relative: 27 %
Lymphs Abs: 0.5 10*3/uL — ABNORMAL LOW (ref 0.7–4.0)
MCH: 28.7 pg (ref 26.0–34.0)
MCHC: 32.1 g/dL (ref 30.0–36.0)
MCV: 89.3 fL (ref 80.0–100.0)
Monocytes Absolute: 0.1 10*3/uL (ref 0.1–1.0)
Monocytes Relative: 7 %
Neutro Abs: 1.3 10*3/uL — ABNORMAL LOW (ref 1.7–7.7)
Neutrophils Relative %: 64 %
Platelet Count: 151 10*3/uL (ref 150–400)
RBC: 3.07 MIL/uL — ABNORMAL LOW (ref 3.87–5.11)
RDW: 20.5 % — ABNORMAL HIGH (ref 11.5–15.5)
WBC Count: 2 10*3/uL — ABNORMAL LOW (ref 4.0–10.5)
nRBC: 0 % (ref 0.0–0.2)

## 2021-08-11 LAB — CMP (CANCER CENTER ONLY)
ALT: 14 U/L (ref 0–44)
AST: 25 U/L (ref 15–41)
Albumin: 3.8 g/dL (ref 3.5–5.0)
Alkaline Phosphatase: 146 U/L — ABNORMAL HIGH (ref 38–126)
Anion gap: 10 (ref 5–15)
BUN: 11 mg/dL (ref 6–20)
CO2: 25 mmol/L (ref 22–32)
Calcium: 10.2 mg/dL (ref 8.9–10.3)
Chloride: 99 mmol/L (ref 98–111)
Creatinine: 0.56 mg/dL (ref 0.44–1.00)
GFR, Estimated: >60 mL/min (ref >60)
Glucose, Bld: 243 mg/dL — ABNORMAL HIGH (ref 70–99)
Potassium: 4.1 mmol/L (ref 3.5–5.1)
Sodium: 134 mmol/L — ABNORMAL LOW (ref 135–145)
Total Bilirubin: 1.4 mg/dL — ABNORMAL HIGH (ref 0.3–1.2)
Total Protein: 8.7 g/dL — ABNORMAL HIGH (ref 6.5–8.1)

## 2021-08-11 LAB — CEA (ACCESS): CEA (CHCC): 188.54 ng/mL — ABNORMAL HIGH (ref 0.00–5.00)

## 2021-08-11 MED ORDER — LONSURF 15-6.14 MG PO TABS
45.0000 mg | ORAL_TABLET | Freq: Two times a day (BID) | ORAL | 0 refills | Status: DC
  Filled 2021-08-11: qty 60, 28d supply, fill #0

## 2021-08-11 MED ORDER — PROCHLORPERAZINE MALEATE 10 MG PO TABS: 10.0000 mg | ORAL_TABLET | Freq: Four times a day (QID) | ORAL | 2 refills | Status: DC | PRN

## 2021-08-11 MED ORDER — SODIUM CHLORIDE 0.9% FLUSH
10.0000 mL | Freq: Once | INTRAVENOUS | Status: AC
  Administered 2021-08-11: 10 mL

## 2021-08-11 MED ORDER — HEPARIN SOD (PORK) LOCK FLUSH 100 UNIT/ML IV SOLN
500.0000 [IU] | Freq: Once | INTRAVENOUS | Status: AC
  Administered 2021-08-11: 500 [IU]

## 2021-08-11 NOTE — Patient Instructions (Signed)

## 2021-08-11 NOTE — Progress Notes (Signed)
[facility] OFFICE PROGRESS NOTE   Diagnosis: Colon cancer  INTERVAL HISTORY:   [name] returns as scheduled.  She began cycle 2 Lonsurf 07/24/2021.  She had a few episodes of nausea, relieved with Compazine.  No mouth sores.  No diarrhea.  No rash.  She denies bleeding.  No fever.  Overall appetite is poor.  Objective:  Vital signs in last 24 hours:  Blood pressure 118/71, pulse 65, temperature 97.9 F (36.6 C), temperature source Oral, resp. rate 18, height 5' 4.5" (1.638 m), weight 137 lb (62.1 kg), SpO2 98 %.    HEENT: Geographic tongue.  No ulcers. Resp: Lungs clear bilaterally. Cardio: Regular rate and rhythm. GI: Abdomen mildly distended.  Firm fullness left upper abdomen. Vascular: No leg edema. Skin: No rash. Port-A-Cath without erythema.  Lab Results:  Lab Results  Component Value Date   WBC 2.0 (L) 08/11/2021   HGB 8.8 (L) 08/11/2021   HCT 27.4 (L) 08/11/2021   MCV 89.3 08/11/2021   PLT 151 08/11/2021   NEUTROABS 1.3 (L) 08/11/2021    Imaging:  No results found.  Medications: I have reviewed the patient's current medications.  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 The tumor returned microsatellite stable with no loss of mismatch repair protein expression   APC mutated. No BRAF, KRAS, or NRAS mutation On Foundation 1 testing   Cycle 1 adjuvant FOLFOX 09/08/2013   Cycle 2 adjuvant FOLFOX 09/24/2013   Cycle 3 adjuvant FOLFOX 10/08/2013.   Cycle 4 adjuvant FOLFOX 10/22/2013.   Cycle 5 adjuvant FOLFOX 11/05/2013. Oxaliplatin held due to thrombocytopenia. Cycle 6 FOLFOX 11/19/2013. Cycle 7 FOLFOX 12/03/2013. Oxaliplatin held secondary to thrombocytopenia. Cycle 8 FOLFOX 12/17/2013. Cycle 9 FOLFOX 01/04/2014. Oxaliplatin held secondary to neutropenia.   Cycle 10 FOLFOX 01/21/2014. Oxaliplatin held secondary to  thrombocytopenia. Cycle 11 FOLFOX 02/04/2014 Cycle 12 FOLFOX 02/18/2014, oxaliplatin dose reduced secondary tothrombocytopenia CT abdomen/pelvis 01/30/2014 revealed splenomegaly and no evidence of recurrent colon cancer CT chest 04/07/2014 with a stable right lower lobe nodule and no evidence for metastatic disease, no nodules seen on the CT 11/26/2014 Markedly elevated CEA 11/24/2014 CT 11/26/2014 revealed a right pelvic mass, splenomegaly, small volume ascites Right salpingo-oophorectomy 12/28/2014 with the pathology confirming metastatic colon cancer CTs 03/23/2016-no evidence of recurrent or metastatic disease. CT 11/26/2016-enlargement of a fluid density structure the right pelvic sidewall, no other evidence of metastatic disease CT aspiration right pelvic cyst 12/19/2016.  Cytology-BENIGN REACTIVE/REPARATIVE CHANGES. CTs 06/05/2017- no evidence of metastatic disease, mild cirrhotic changes with splenomegaly CTs 12/11/2017- recurrent cystic right adnexal mass, similar to on the CT 11/26/2016, stable mild splenomegaly CTs 04/23/2018- enlargement of cystic right adnexal mass with mural nodularity, no other evidence of metastatic disease Cytoreductive surgery/HIPEC with mitomycin by Dr. Clovis Riley at [facility] 08/12/2018-R1 resection achieved.  Cytoreduction included omentectomy, LAR, right salpingo-oophorectomy and left colonic gutter/pelvic stripping.  Pathology on the rectum showed recurrent/metastatic adenocarcinoma, tumor 2.0 cm, predominantly involving the subserosa and muscularis propria of the colon, proximal and distal margins of resection were negative, vascular invasion present, metastatic carcinoma present in 1 out of 5 lymph nodes; omentum resection with no malignancy seen, no metastatic carcinoma identified in 1 lymph node examined; left gutter stripping positive metastatic adenocarcinoma; right ovary resection positive metastatic adenocarcinoma. 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 CEA 68 02/03/2019 Biopsy liver lesion 02/11/2019-metastatic adenocarcinoma consistent with primary colonic adenocarcinoma CTs 02/27/2019-multiple  liver lesions increased in size, new lesion in the lateral right lobe of the liver.  No evidence of metastatic disease in the chest.  Redemonstrated moderate left hydronephrosis and proximal hydroureter without discrete lesion or obstructing etiology at the transition point of the mid ureter. Cycle 1 FOLFIRI/Panitumumab 03/12/2019 Cycle 2 FOLFIRI/Panitumumab 03/26/2019-bolus 5-FU and irinotecan held secondary to neutropenia Cycle 3 FOLFIRI/Panitumumab 04/09/2019, Udenyca added-not given secondary to seizure/discontinuation of the 5-FU pump CT abdomen[facility]pelvis at [facility] 05/04/2019-no residual fluid collection at the left abdominal wall abscess, stable moderate left hydronephrosis, multifocal indeterminate liver lesions--liver lesions significantly improved Cycle 4 FOLFIRI/Panitumumab 05/07/2019, Udenyca Cycle 5 FOLFIRI/Panitumumab 05/20/2019, Udenyca Cycle 6 FOLFIRI/Panitumumab 06/18/2019, Udenyca Cycle 7 FOLFIRI/Panitumumab 07/01/2019 (Irinotecan dose reduced due to thrombocytopenia), Udenyca--Udenyca was not given Cycle 8 FOLFIRI/Panitumumab 07/15/2019, Udenyca Cycle 9 FOLFIRI/Panitumumab 07/29/2019, Udenyca Cycle 10 FOLFIRI/Panitumumab 08/13/2019, Udenyca CTs at [facility] 08/19/2019-decreased size of the hypodense and hyperdense lesions within segment 2 of the left hepatic lobe.  No new lesions identified.  Similar presacral soft tissue thickening with adjacent stable alignment in the rectum.  Improved but persistent left UPJ obstruction with mild left hydronephrosis.  Persistent but decreased size of the wound within the left mid abdomen abdominal wall. Cycle 11 irinotecan/Panitumumab 08/27/2019, Udenyca Cycle 12 irinotecan/Panitumumab 10/08/2019, Udenyca Cycle 13 irinotecan/Panitumumab  11/05/2019, Udenyca CT abdomen/pelvis 11/19/2019-no new or progressive interval findings.  Stable appearance of the calcified lesion in the anterior left hepatic dome with second tiny low-density lesion in the dome of the lateral segment left liver.  Both lesions are markedly decreased since 02/27/2019.  Stable mild fullness left intrarenal collecting system and renal pelvis.  Similar appearance of abnormal soft tissue in the left paramidline subcutaneous fat with associated skin thickening, this likely correlates to the fistula. Cycle 14 irinotecan/Panitumumab 11/19/2019, Udenyca Cycle 15 irinotecan/Panitumumab 12/02/2019, Udenyca CT abdomen/pelvis at [facility] 02/11/2020- similar to slight decrease in segment 2 hypodense and hyperdense lesions (compared to a CT from July 2020), no new lesions, splenomegaly, no fluid collection or abscess, mild stranding adjacent to the incision with overlying skin thickening Cycle 16 irinotecan/Panitumumab 03/03/2020, Udenyca Cycle 17 irinotecan/Panitumumab 03/17/2020, Udenyca Cycle 18 irinotecan/panitumumab 04/07/2020, Udenyca Cycle 19 irinotecan/Panitumumab 04/28/2020, Udenyca Cycle 20 irinotecan/panitumumab 05/26/2020, Udenyca Cycle 21 irinotecan/panitumumab 06/30/2020, Udenyca CT abdomen/pelvis 07/19/2020-mildly increased hepatic steatosis.  Increased size of 2.8 cm hypervascular lesion segment 2 left hepatic lobe. Cycle 22 irinotecan/Panitumumab 07/21/2020, Udenyca MRI Abd 07/28/2020: liver lesions consistent with hepatic metastasis Cycle 23 irinotecan/Panitumumab 08/04/2020, Udenyca  Cycle 24 irinotecan/panitumumab 08/22/2020 Irinotecan/panitumumab discontinued secondary to diarrhea and skin toxicity Guardant 360 on 09/12/2020-K-ras G12V, PIK3CA,NTRK2-VUS, tumor mutation burden 6.85, MSI-high not detected Referred for consideration of Y 90 CTs 10/11/2020-for enlarging hepatic metastatic lesions identified.  No new lesions.  Several tiny nodules in the lungs in the 2 to 3 mm  diameter range.  Mild splenomegaly.  Mild left hydronephrosis. 11/17/2020-Y90 lateral segment left lobe of the liver 12/08/2020-Y 90 treatment to the right liver CTs 03/16/2021-multiple new small pulmonary nodules.  Response to therapy in the liver, decreasing size of liver lesions with signs of interval radioembolization; findings suspicious for perianastomotic recurrence in the pelvis; new small volume ascites and some right colonic thickening as well as retroperitoneal thickening. Cycle 1 FOLFOX 04/10/2021 Cycle 2 FOLFOX 04/24/2021 CT abdomen/pelvis 05/06/2021-increased ascites, stable hypodense liver lesions, splenomegaly, soft tissue density at the left pelvis-stable or decreased in size by my measurement Cycle 3 FOLFOX 06/05/2021 FOLFOX discontinued secondary to poor tolerance with fever requiring repeat hospitalization Cycle 1 Lonsurf 06/26/2021, not  a candidate for bevacizumab secondary to esophageal varices Cycle 2 Lonsurf 07/24/2021 Mild elevation of the CEA beginning January 2016 , normal on 05/19/2014   History of iron deficiency anemia   seizure disorder; seizure 04/09/2019.  Brain CT negative.  Now on Keppra. history of depression   4 mm right lower lobe nodule on a staging chest CT 09/08/2013 , stable on a CT 04/07/2014  Hospitalization 09/24/2013 through 09/26/2013 with fever and abdominal pain.   09/24/2013 urine culture positive for coag negative staph.   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  Scope advanced for approximately 25 cm.  Most proximal portion had necrotic appearing debris.  Colostomy bag insufflated suggesting some type of communication between the pouch and the proximal colon.  Introduction of scope into the ostomy found to  available directions.  1 toward the distal pouch with similar appearing necrotic debris encountered.  The other was about a 30 cm segment of normal-appearing colonic mucosa to the level of the previous right hemicolectomy ileocolonic anastomosis. Port-A-Cath placement interventional radiology 03/05/2019 Left leg/foot weakness-brain MRI 09/03/2019 with no evidence of metastatic disease, referred to physical therapy Possible abdominal wall abscess status post evaluation by surgery at [facility] 04/03/2019, antibiotics initiated; incision and drainage with purulent material removed 04/22/2019 CT at [facility] 05/04/2019-no fluid collection at the site of the left abdominal wall abscess, "indeterminate" liver lesions CT at [facility] 06/04/2019-persistent open wound of the left abdomen, 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  No malignancy.   16. COVID-19 + 09/10/2019 17. Hospitalized with seizure 09/17/2019 through 09/19/2019-MRI brain without evidence of metastatic disease. Seen by neurology. Keppra dose increased. 19.  Rash secondary to Panitumumab 20.  COVID-19 09/22/2020 21.  Admission 04/24/2021 with a fever-no source for infection identified 22.  Ascites-paracentesis, cytology 05/09/2021, 05/15/2021, 05/30/2021-negative 23.  Admission 06/06/2021 with fever-no source for infection identified 24.  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  Stomach with diffuse portal hypertensive gastropathy.  Mild contact friability.    Disposition: Ms. Klimas appears stable.  She has completed 2 cycles of Lonsurf, overall seems to be tolerating well.  Labs today show mild neutropenia.  She understands to contact the office with fever, chills, other signs of infection.  She will return for a repeat CBC 08/18/2021 with the plan to begin cycle 3  Lonsurf on 08/21/2021 if [place] is adequate.  Restaging CTs after she has completed 3 or 4 cycles.  She will return for follow-up on 09/07/2021.  We are available to see her sooner if needed.    Ned Card ANP/GNP-BC   08/11/2021  11:06 AM

## 2021-08-11 NOTE — Telephone Encounter (Signed)
-----   Message from Owens Shark, NP sent at 08/11/2021  3:53 PM EDT ----- Please let her know Dr. Benay Spice would like for her to have a CT scan just before the next office visit in August.  I will enter the CT orders.

## 2021-08-11 NOTE — Telephone Encounter (Signed)
Patient is schedule for 08/30/21 at 1030 arrived at 1015. She will come by and pick up her contrast by 08/30/21. NPO prior 4, 1st bottle at 830,2nd bottle at 930. Patient gave verbal understanding and had no further questions or concerns.

## 2021-08-15 ENCOUNTER — Other Ambulatory Visit (HOSPITAL_COMMUNITY): Payer: Self-pay

## 2021-08-18 ENCOUNTER — Other Ambulatory Visit: Payer: Self-pay | Admitting: Oncology

## 2021-08-18 ENCOUNTER — Telehealth: Payer: Self-pay | Admitting: *Deleted

## 2021-08-18 ENCOUNTER — Inpatient Hospital Stay: Payer: Medicaid Other

## 2021-08-18 DIAGNOSIS — C189 Malignant neoplasm of colon, unspecified: Secondary | ICD-10-CM

## 2021-08-18 DIAGNOSIS — C182 Malignant neoplasm of ascending colon: Secondary | ICD-10-CM | POA: Diagnosis not present

## 2021-08-18 DIAGNOSIS — C787 Secondary malignant neoplasm of liver and intrahepatic bile duct: Secondary | ICD-10-CM

## 2021-08-18 LAB — CBC WITH DIFFERENTIAL (CANCER CENTER ONLY)
Abs Immature Granulocytes: 0.01 10*3/uL (ref 0.00–0.07)
Basophils Absolute: 0 10*3/uL (ref 0.0–0.1)
Basophils Relative: 0 %
Eosinophils Absolute: 0 10*3/uL (ref 0.0–0.5)
Eosinophils Relative: 1 %
HCT: 28.9 % — ABNORMAL LOW (ref 36.0–46.0)
Hemoglobin: 9.2 g/dL — ABNORMAL LOW (ref 12.0–15.0)
Immature Granulocytes: 1 %
Lymphocytes Relative: 30 %
Lymphs Abs: 0.5 10*3/uL — ABNORMAL LOW (ref 0.7–4.0)
MCH: 28.8 pg (ref 26.0–34.0)
MCHC: 31.8 g/dL (ref 30.0–36.0)
MCV: 90.6 fL (ref 80.0–100.0)
Monocytes Absolute: 0.2 10*3/uL (ref 0.1–1.0)
Monocytes Relative: 14 %
Neutro Abs: 1 10*3/uL — ABNORMAL LOW (ref 1.7–7.7)
Neutrophils Relative %: 54 %
Platelet Count: 174 10*3/uL (ref 150–400)
RBC: 3.19 MIL/uL — ABNORMAL LOW (ref 3.87–5.11)
RDW: 20.4 % — ABNORMAL HIGH (ref 11.5–15.5)
WBC Count: 1.8 10*3/uL — ABNORMAL LOW (ref 4.0–10.5)
nRBC: 0 % (ref 0.0–0.2)

## 2021-08-18 NOTE — Telephone Encounter (Signed)
Notified [name] of her lower WBC/ANC and to not start [place] on Monday. Will re-check CBC on 7/21 at 10:00 and let her know that day if OK to start [place] on 7/24. Call for fever or s/s of infection. She understands and agrees.

## 2021-08-18 NOTE — Telephone Encounter (Signed)
-----   Message from Owens Shark, NP sent at 08/18/2021  1:00 PM EDT ----- Her ANC is lower.  She should not begin the next cycle of L[place]urf.  Repeat CBC in 1 week.  Call for fever, chills, other signs of infection.  I will be out that day.  Can you follow-up on this and show it to Dr. Benay Spice?

## 2021-08-22 ENCOUNTER — Encounter: Payer: Self-pay | Admitting: Oncology

## 2021-08-22 ENCOUNTER — Other Ambulatory Visit: Payer: Self-pay | Admitting: Nurse Practitioner

## 2021-08-22 ENCOUNTER — Inpatient Hospital Stay: Payer: Medicaid Other | Admitting: Licensed Clinical Social Worker

## 2021-08-22 DIAGNOSIS — C189 Malignant neoplasm of colon, unspecified: Secondary | ICD-10-CM

## 2021-08-22 MED ORDER — OXYCODONE HCL 5 MG PO TABS: 5.0000 mg | ORAL_TABLET | Freq: Four times a day (QID) | ORAL | 0 refills | Status: DC | PRN

## 2021-08-22 NOTE — Progress Notes (Signed)
[facility] CSW Progress Note  Clinical Education officer, museum contacted patient by phone to follow up after initial meeting.  Patient complained of being tired.  She said she was supposed to stop taking her oral chemotherapy until she has lab work done on Friday.  Her husband continues working.  She said her mother and sister  are supportive.  Patient is not eligible for the [employer] due to financial status at this time.  CSW provided active listening and supportive counseling.  Encouraged patient to contact CSW with any questions or needs.  Patient said she understood.    Rodman Pickle Jaymon Dudek, LCSW    Patient is participating in a Managed Medicaid Plan:  Yes

## 2021-08-25 ENCOUNTER — Telehealth: Payer: Self-pay | Admitting: *Deleted

## 2021-08-25 ENCOUNTER — Inpatient Hospital Stay: Payer: Medicaid Other

## 2021-08-25 DIAGNOSIS — C182 Malignant neoplasm of ascending colon: Secondary | ICD-10-CM | POA: Diagnosis not present

## 2021-08-25 DIAGNOSIS — C189 Malignant neoplasm of colon, unspecified: Secondary | ICD-10-CM

## 2021-08-25 LAB — CBC WITH DIFFERENTIAL (CANCER CENTER ONLY)
Abs Immature Granulocytes: 0.01 10*3/uL (ref 0.00–0.07)
Basophils Absolute: 0 10*3/uL (ref 0.0–0.1)
Basophils Relative: 1 %
Eosinophils Absolute: 0 10*3/uL (ref 0.0–0.5)
Eosinophils Relative: 1 %
HCT: 29.9 % — ABNORMAL LOW (ref 36.0–46.0)
Hemoglobin: 9.6 g/dL — ABNORMAL LOW (ref 12.0–15.0)
Immature Granulocytes: 1 %
Lymphocytes Relative: 24 %
Lymphs Abs: 0.5 10*3/uL — ABNORMAL LOW (ref 0.7–4.0)
MCH: 29.9 pg (ref 26.0–34.0)
MCHC: 32.1 g/dL (ref 30.0–36.0)
MCV: 93.1 fL (ref 80.0–100.0)
Monocytes Absolute: 0.2 10*3/uL (ref 0.1–1.0)
Monocytes Relative: 8 %
Neutro Abs: 1.4 10*3/uL — ABNORMAL LOW (ref 1.7–7.7)
Neutrophils Relative %: 65 %
Platelet Count: 144 10*3/uL — ABNORMAL LOW (ref 150–400)
RBC: 3.21 MIL/uL — ABNORMAL LOW (ref 3.87–5.11)
RDW: 20.4 % — ABNORMAL HIGH (ref 11.5–15.5)
WBC Count: 2.1 10*3/uL — ABNORMAL LOW (ref 4.0–10.5)
nRBC: 0 % (ref 0.0–0.2)

## 2021-08-25 NOTE — Telephone Encounter (Signed)
Notified Emony that her WBC is improved and OK to resume [place]f on 08/28/21.

## 2021-08-25 NOTE — Telephone Encounter (Signed)
-----   Message from [name], MD sent at 08/25/2021  1:38 PM EDT ----- Please call patient, white count is higher, okay to start [facility] 08/28/2021

## 2021-08-28 ENCOUNTER — Other Ambulatory Visit: Payer: Self-pay | Admitting: *Deleted

## 2021-08-28 ENCOUNTER — Other Ambulatory Visit (HOSPITAL_COMMUNITY): Payer: Self-pay

## 2021-08-28 NOTE — Patient Outreach (Signed)
Care Coordination   Follow Up Visit Note   08/28/2021 Name: [name] MRN: [id number] DOB: [birth year]  [name] is a 46 y.o. year old female who sees [name], [name], MD for primary care. I spoke with  Mayer Masker by phone today  What matters to the patients health and wellness today?  Completing my burial plans.  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 Completed 08/28/2021  Start Date: 07/12/2021  Expected End Date: 08/28/2021  Recent Progress: On track  Priority: Medium  Note:    Update 08/28/21:  (Status: Goal on Track (progressing): YES. Goal Met.) Short Term Goal  Evaluation of current treatment plan related to REDUCE FAMILY STRESS and patient's adherence to plan as established by provider Pt reports they have not had a family meeting but pt and husband have been talking and they have sold a car to pay off some of their loans and to help with her funeral expenses. She and her mother will go to the funeral home to complete her plans. Listened and supported pt and encouraged that she and her mother do something fun afterwards like get an ice cream cone or go see a funny movie. She and her husband do plan on having a meeting at the appropriate time for them.  Update 07/26/21:  (Status: Goal on Track (progressing): YES.) Short Term Goal  Evaluation of current treatment plan related to [place] and patient's adherence to plan as established by provider [name] reports she has seen the LCSW, Ms. Donivan Scull in the oncology office who has talked with her at length and given her some ideas on how to reduce her stress. [name] actually sounds cheerful today which is very  different, NP commented on the change in her demeanor. She says her husband is giving her a lot of love and tenderness and she is not having to take as much of her antianxiety medication. They have not scheduled a family meeting but [name] feels like she can sent everyone a text that she has news that they need to discuss with everyone. She intends on sharing her prognosis with everyone and requesting changes in the home that would benefit her and everyone in the household. She sounds very strong today. NP noted her cheerfulness and strength.  Update 07/19/21:  (Status: Goal on Track (progressing): YES.) Short Term Goal  Evaluation of current treatment plan related to [place] and patient's adherence to plan as established by provider NP talked to [name] last week and [name] reports she and her husband have talked and they will be having a family meeting to discuss peaceful co-habitation and contributing to the household expenses (mortgage, IT trainer, cable).   Current Barriers:  Feels she is unable to advocate as other family members are sister-in-law and her son. Need husband involvement.  RNCM Clinical Goal(s):  Patient will work with Education officer, museum to address [place] Concerns  and Family and relationship dysfunction related to the management of Healthy Co-habitation as evidenced by review of EMR and patient or social worker report     through collaboration with Consulting civil engineer, provider, and care team.  Interventions: Inter-disciplinary care team collaboration (see longitudinal plan of care) Evaluation of current treatment plan related to  self management and patient's adherence to plan as established by provider  Patient Goals/Self-Care Activities: Work with the social worker to address care coordination needs and will continue to work with the clinical team to address health care and disease management related needs    Long-Range Goal: Patient will report improved mood and  decreased stress over the next 90 days per her report. Completed 08/28/2021  Start Date: 07/07/2021  Expected End Date: 10/13/2021  This Visit's Progress: On track  Recent Progress: On track  Priority: High  Note:    Update 08/28/21:  (Status: Goal Met.) Long Term Goal  Evaluation of current treatment plan related to DEPRESSION AND STRESS  and patient's adherence to plan as established by provider Pt reports she is doing fairly well. Talking with the CSW in Oncology and our conversations has helped. Discussing her anxiety and fears with her husband and mother are also contributing to her general mental well-being improvement.  Update:6 /21/23 (Status: Goal on Track (progressing): YES.) Long Term Goal  Evaluation of current treatment plan related to IMPROVED MOOD/DECREASED STRESS and patient's adherence to plan as established by provider [name] is in a cheerful state of mind today. She reports her meeting with Ms. Duffy, LCSW was very helpful. She was given some good tools to use when under stress. She is using less of her antianxiety medication. Encouraged her to continued to meet with Ms. Duffy and share ideas with her husband to bring them closer together.  Update 07/20/21:  (Status: Goal on Track (progressing): YES.) Long Term Goal  Evaluation of current treatment plan related to STRESS REDUCTION and patient's adherence to plan as established by provider Pt has been informed she has months to live so she and her husband are trying to come to terms with this news. She is at least glad to know her prognosis. Dr. Benay Spice did discuss her medical regimen and they decided not her current regimen is more beneficial for her that to start an antidepressant. She has not talked with the oncology social worker. She has been notified that she will be getting Medicaid. NP asked if the pt was familiar with [facility] services and she was not. NP described services and encouraged her to accept them when her doctor  feels it is appropriate. Will message Dr. Benay Spice to consider on her next visit  Update 07/12/21:  (Status: Goal on track: NO.) Long Term Goal  Evaluation of current treatment plan related to DEPRESSION and patient's adherence to plan as established by provider Long conversation with pt regarding her current state of mind. She admits to being very depressed and just as stressed as ever. She says she is having crying episodes, just being overwhelmed with her worries. She has a lot on her mind. She is not sure what her disease trajectory is or life expectancy. She has been making funeral plans but the expenses are going to be over what they could reasonably afford, her insurance is expiring and she has not been approved for Medicaid yet, she is waiting on word about this. She has called the CSW in charge of this but has not received call backs.        1) NP has messaged oncologist and LCSW in their office to request assistance with medication (antidepressant Rx) and counseling to answer some of these questions to alleviate some of the things she worries about.  2) NP has advised will reach out to Medicaid to try to get an update on her status. NP has done this today, left a message for [employer].        3) Requested that pt make a list of things that her sister-in-law and nephew could do to assist with household bills and consideration of pt health status. Plan a family meeting within the next month (new goals).        4) NP to talk with her husband about this and request his assistance and advocacy for his wife to improve her quality of life.        5)pt to make a list of things she wants to do before she cannot do them. She wants to go to [place] again where she and her husband went for their honeymoon. NP to put out notice request for anyone that has or knows someone that has a vacation spot there that they could rent cheaply or consider free stay. Get your mani/pedi's every 2 weeks.         6) Be mindful to banish worries that you cannot do anything about from your mind. Have some distraction activities to take your mind off of these things: Read good book, reminisce on good memories, take a walk, call a friend that makes you laugh....  Current Barriers:  Care Coordination needs related to Mental Health Concerns  and Family and relationship dysfunction   RNCM Clinical Goal(s):  Patient will demonstrate improved health management independence as evidenced by pt report        continue to work with RN Care Manager and/or Social Worker to address care management and care coordination needs related to Depression and Stress as evidenced by adherence to CM Team Scheduled appointments     work with Education officer, museum to address [place] Concerns  related to the management of Depression and Stress as evidenced by review of EMR and patient or Education officer, museum report     through collaboration with Consulting civil engineer, provider, and care team.   Interventions: Inter-disciplinary care team collaboration (see longitudinal plan of care) Evaluation of current treatment plan related to  self management and patient's adherence to plan as established by provider  Patient Goals/Self-Care Activities: Take medications as prescribed   Attend all scheduled provider appointments Call pharmacy for medication refills 3-7 days in advance of running out of medications Perform all self care activities independently  Call provider office for new concerns or questions  Work with the social worker to address care coordination needs and will continue to work with the clinical team to address health care and disease management related needs      SDOH assessments and interventions completed:   Yes, previously addressed.  Care Coordination Interventions Activated:  Yes Current care plan goals completed. Care Coordination Interventions:  Yes, provided. Reinforced NP availability for calls and concerns.  Follow up  plan: Follow up call scheduled for August 24th.  Encounter Outcome:  Pt. Visit Completed  Kayleen Memos C. Myrtie Neither, MSN, [facility] Gerontological Nurse Practitioner [facility] Care Management [phone]

## 2021-08-30 ENCOUNTER — Ambulatory Visit (HOSPITAL_BASED_OUTPATIENT_CLINIC_OR_DEPARTMENT_OTHER)
Admission: RE | Admit: 2021-08-30 | Discharge: 2021-08-30 | Disposition: A | Discharge status: Home / Self Care | Payer: BC Managed Care – PPO | Source: Ambulatory Visit | Attending: Nurse Practitioner | Admitting: Nurse Practitioner

## 2021-08-30 ENCOUNTER — Encounter (HOSPITAL_BASED_OUTPATIENT_CLINIC_OR_DEPARTMENT_OTHER): Payer: Self-pay

## 2021-08-30 DIAGNOSIS — C189 Malignant neoplasm of colon, unspecified: Secondary | ICD-10-CM | POA: Diagnosis not present

## 2021-08-30 DIAGNOSIS — C787 Secondary malignant neoplasm of liver and intrahepatic bile duct: Secondary | ICD-10-CM | POA: Insufficient documentation

## 2021-08-30 MED ORDER — IOHEXOL 300 MG/ML  SOLN
100.0000 mL | Freq: Once | INTRAMUSCULAR | Status: AC | PRN
  Administered 2021-08-30: 75 mL via INTRAVENOUS

## 2021-08-30 MED ORDER — HEPARIN SOD (PORK) LOCK FLUSH 100 UNIT/ML IV SOLN
500.0000 [IU] | Freq: Once | INTRAVENOUS | Status: AC
  Administered 2021-08-30: 500 [IU] via INTRAVENOUS

## 2021-09-01 ENCOUNTER — Other Ambulatory Visit: Payer: Self-pay | Admitting: Neurology

## 2021-09-01 ENCOUNTER — Encounter: Payer: Self-pay | Admitting: Oncology

## 2021-09-06 ENCOUNTER — Other Ambulatory Visit (HOSPITAL_COMMUNITY): Payer: Self-pay

## 2021-09-06 ENCOUNTER — Encounter: Payer: Self-pay | Admitting: Oncology

## 2021-09-07 ENCOUNTER — Other Ambulatory Visit: Payer: Self-pay | Admitting: Oncology

## 2021-09-07 ENCOUNTER — Other Ambulatory Visit: Payer: Self-pay

## 2021-09-07 ENCOUNTER — Inpatient Hospital Stay (HOSPITAL_BASED_OUTPATIENT_CLINIC_OR_DEPARTMENT_OTHER): Payer: Medicaid Other | Admitting: Oncology

## 2021-09-07 ENCOUNTER — Encounter: Payer: Self-pay | Admitting: Oncology

## 2021-09-07 ENCOUNTER — Telehealth: Payer: Self-pay | Admitting: *Deleted

## 2021-09-07 ENCOUNTER — Inpatient Hospital Stay: Payer: Medicaid Other | Attending: Oncology

## 2021-09-07 ENCOUNTER — Inpatient Hospital Stay: Payer: Medicaid Other

## 2021-09-07 DIAGNOSIS — C189 Malignant neoplasm of colon, unspecified: Secondary | ICD-10-CM

## 2021-09-07 DIAGNOSIS — R188 Other ascites: Secondary | ICD-10-CM | POA: Diagnosis not present

## 2021-09-07 DIAGNOSIS — C182 Malignant neoplasm of ascending colon: Secondary | ICD-10-CM | POA: Insufficient documentation

## 2021-09-07 DIAGNOSIS — K766 Portal hypertension: Secondary | ICD-10-CM | POA: Insufficient documentation

## 2021-09-07 DIAGNOSIS — K3189 Other diseases of stomach and duodenum: Secondary | ICD-10-CM | POA: Insufficient documentation

## 2021-09-07 DIAGNOSIS — Z95828 Presence of other vascular implants and grafts: Secondary | ICD-10-CM

## 2021-09-07 DIAGNOSIS — R21 Rash and other nonspecific skin eruption: Secondary | ICD-10-CM | POA: Diagnosis not present

## 2021-09-07 DIAGNOSIS — R918 Other nonspecific abnormal finding of lung field: Secondary | ICD-10-CM | POA: Diagnosis not present

## 2021-09-07 DIAGNOSIS — C787 Secondary malignant neoplasm of liver and intrahepatic bile duct: Secondary | ICD-10-CM

## 2021-09-07 DIAGNOSIS — D509 Iron deficiency anemia, unspecified: Secondary | ICD-10-CM | POA: Insufficient documentation

## 2021-09-07 DIAGNOSIS — C7961 Secondary malignant neoplasm of right ovary: Secondary | ICD-10-CM | POA: Insufficient documentation

## 2021-09-07 DIAGNOSIS — Z452 Encounter for adjustment and management of vascular access device: Secondary | ICD-10-CM | POA: Diagnosis not present

## 2021-09-07 LAB — CBC WITH DIFFERENTIAL (CANCER CENTER ONLY)
Abs Immature Granulocytes: 0.01 10*3/uL (ref 0.00–0.07)
Basophils Absolute: 0 10*3/uL (ref 0.0–0.1)
Basophils Relative: 0 %
Eosinophils Absolute: 0 10*3/uL (ref 0.0–0.5)
Eosinophils Relative: 1 %
HCT: 28.4 % — ABNORMAL LOW (ref 36.0–46.0)
Hemoglobin: 9.2 g/dL — ABNORMAL LOW (ref 12.0–15.0)
Immature Granulocytes: 0 %
Lymphocytes Relative: 21 %
Lymphs Abs: 0.6 10*3/uL — ABNORMAL LOW (ref 0.7–4.0)
MCH: 30.4 pg (ref 26.0–34.0)
MCHC: 32.4 g/dL (ref 30.0–36.0)
MCV: 93.7 fL (ref 80.0–100.0)
Monocytes Absolute: 0.2 10*3/uL (ref 0.1–1.0)
Monocytes Relative: 8 %
Neutro Abs: 2.2 10*3/uL (ref 1.7–7.7)
Neutrophils Relative %: 70 %
Platelet Count: 130 10*3/uL — ABNORMAL LOW (ref 150–400)
RBC: 3.03 MIL/uL — ABNORMAL LOW (ref 3.87–5.11)
RDW: 20.2 % — ABNORMAL HIGH (ref 11.5–15.5)
WBC Count: 3 10*3/uL — ABNORMAL LOW (ref 4.0–10.5)
nRBC: 0 % (ref 0.0–0.2)

## 2021-09-07 LAB — CMP (CANCER CENTER ONLY)
ALT: 11 U/L (ref 0–44)
AST: 27 U/L (ref 15–41)
Albumin: 3.5 g/dL (ref 3.5–5.0)
Alkaline Phosphatase: 123 U/L (ref 38–126)
Anion gap: 9 (ref 5–15)
BUN: 8 mg/dL (ref 6–20)
CO2: 25 mmol/L (ref 22–32)
Calcium: 10 mg/dL (ref 8.9–10.3)
Chloride: 100 mmol/L (ref 98–111)
Creatinine: 0.5 mg/dL (ref 0.44–1.00)
GFR, Estimated: >60 mL/min (ref >60)
Glucose, Bld: 146 mg/dL — ABNORMAL HIGH (ref 70–99)
Potassium: 4 mmol/L (ref 3.5–5.1)
Sodium: 134 mmol/L — ABNORMAL LOW (ref 135–145)
Total Bilirubin: 1.5 mg/dL — ABNORMAL HIGH (ref 0.3–1.2)
Total Protein: 7.9 g/dL (ref 6.5–8.1)

## 2021-09-07 LAB — SAMPLE TO BLOOD BANK

## 2021-09-07 LAB — CEA (ACCESS): CEA (CHCC): 413.77 ng/mL — ABNORMAL HIGH (ref 0.00–5.00)

## 2021-09-07 MED ORDER — OXYCODONE HCL 5 MG PO TABS: 5.0000 mg | ORAL_TABLET | ORAL | 0 refills | Status: DC | PRN

## 2021-09-07 MED ORDER — HEPARIN SOD (PORK) LOCK FLUSH 100 UNIT/ML IV SOLN
500.0000 [IU] | Freq: Once | INTRAVENOUS | Status: AC
  Administered 2021-09-07: 500 [IU]

## 2021-09-07 MED ORDER — SODIUM CHLORIDE 0.9% FLUSH
10.0000 mL | Freq: Once | INTRAVENOUS | Status: AC
  Administered 2021-09-07: 10 mL

## 2021-09-07 NOTE — Addendum Note (Signed)
Addended by: Lenox Ponds E on: 09/07/2021 10:26 AM   Modules accepted: Orders

## 2021-09-07 NOTE — Patient Instructions (Signed)

## 2021-09-07 NOTE — Telephone Encounter (Signed)
Hospice has admitted her to services and needs her oxycodone script sent to [facility] in [place]. MD notified and called [facility] to cancel script sent earlier today.

## 2021-09-07 NOTE — Progress Notes (Signed)
[facility] OFFICE PROGRESS NOTE   Diagnosis: Colon cancer  INTERVAL HISTORY:   [name] returns as scheduled.  She began another cycle of Lonsurf on 08/28/2021.  She reports nausea following each dose of Lonsurf.  She continues to have abdominal pain.  She takes oxycodone every 6 hours.  She is using nutrition supplements.  She feels weak.  Objective:  Vital signs in last 24 hours:  Blood pressure 127/80, pulse 66, temperature 98.1 F (36.7 C), temperature source Oral, resp. rate 20, height 5' 4.5" (1.638 m), weight 137 lb 3.2 oz (62.2 kg), SpO2 100 %.    HEENT: No thrush or ulcers Resp: Lungs clear bilaterally Cardio: Regular rate and rhythm GI: Mildly distended, firmness in the left lower abdomen, no hepatomegaly Vascular: No leg edema    Portacath/PICC-without erythema  Lab Results:  Lab Results  Component Value Date   WBC 3.0 (L) 09/07/2021   HGB 9.2 (L) 09/07/2021   HCT 28.4 (L) 09/07/2021   MCV 93.7 09/07/2021   PLT 130 (L) 09/07/2021   NEUTROABS 2.2 09/07/2021    CMP  Lab Results  Component Value Date   NA 134 (L) 08/11/2021   K 4.1 08/11/2021   CL 99 08/11/2021   CO2 25 08/11/2021   GLUCOSE 243 (H) 08/11/2021   BUN 11 08/11/2021   CREATININE 0.56 08/11/2021   CALCIUM 10.2 08/11/2021   PROT 8.7 (H) 08/11/2021   ALBUMIN 3.8 08/11/2021   AST 25 08/11/2021   ALT 14 08/11/2021   ALKPHOS 146 (H) 08/11/2021   BILITOT 1.4 (H) 08/11/2021   GFRNONAA >60 08/11/2021   GFRAA >60 11/05/2019    Lab Results  Component Value Date   CEA1 43.43 (H) 06/30/2020   CEA 188.54 (H) 08/11/2021   CA125 14 11/26/2014    Medications: I have reviewed the patient's current medications.   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 The tumor returned microsatellite stable with no loss of mismatch repair protein expression   APC mutated.  No BRAF, KRAS, or NRAS mutation On [facility] 1 testing   Cycle 1 adjuvant FOLFOX 09/08/2013   Cycle 2 adjuvant FOLFOX 09/24/2013   Cycle 3 adjuvant FOLFOX 10/08/2013.   Cycle 4 adjuvant FOLFOX 10/22/2013.   Cycle 5 adjuvant FOLFOX 11/05/2013. Oxaliplatin held due to thrombocytopenia. Cycle 6 FOLFOX 11/19/2013. Cycle 7 FOLFOX 12/03/2013. Oxaliplatin held secondary to thrombocytopenia. Cycle 8 FOLFOX 12/17/2013. Cycle 9 FOLFOX 01/04/2014. Oxaliplatin held secondary to neutropenia.   Cycle 10 FOLFOX 01/21/2014. Oxaliplatin held secondary to thrombocytopenia. Cycle 11 FOLFOX 02/04/2014 Cycle 12 FOLFOX 02/18/2014, oxaliplatin dose reduced secondary tothrombocytopenia CT abdomen/pelvis 01/30/2014 revealed splenomegaly and no evidence of recurrent colon cancer CT chest 04/07/2014 with a stable right lower lobe nodule and no evidence for metastatic disease, no nodules seen on the CT 11/26/2014 Markedly elevated CEA 11/24/2014 CT 11/26/2014 revealed a right pelvic mass, splenomegaly, small volume ascites Right salpingo-oophorectomy 12/28/2014 with the pathology confirming metastatic colon cancer CTs 03/23/2016-no evidence of recurrent or metastatic disease. CT 11/26/2016-enlargement of a fluid density structure the right pelvic sidewall, no other evidence of metastatic disease CT aspiration right pelvic cyst 12/19/2016.  Cytology-BENIGN REACTIVE/REPARATIVE CHANGES. CTs 06/05/2017- no evidence of metastatic disease, mild cirrhotic changes with splenomegaly CTs 12/11/2017- recurrent cystic right adnexal mass, similar to on the CT 11/26/2016, stable mild splenomegaly CTs 04/23/2018- enlargement of cystic right adnexal mass with mural nodularity, no other evidence of metastatic disease Cytoreductive surgery/HIPEC  with mitomycin by Dr. Clovis Riley at [facility] 08/12/2018-R1 resection achieved.  Cytoreduction included omentectomy, LAR, right salpingo-oophorectomy and left colonic gutter/pelvic stripping.  Pathology on  the rectum showed recurrent/metastatic adenocarcinoma, tumor 2.0 cm, predominantly involving the subserosa and muscularis propria of the colon, proximal and distal margins of resection were negative, vascular invasion present, metastatic carcinoma present in 1 out of 5 lymph nodes; omentum resection with no malignancy seen, no metastatic carcinoma identified in 1 lymph node examined; left gutter stripping positive metastatic adenocarcinoma; right ovary resection positive metastatic adenocarcinoma. 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 CEA 68 02/03/2019 Biopsy liver lesion 02/11/2019-metastatic adenocarcinoma consistent with primary colonic adenocarcinoma CTs 02/27/2019-multiple liver lesions increased in size, new lesion in the lateral right lobe of the liver.  No evidence of metastatic disease in the chest.  Redemonstrated moderate left hydronephrosis and proximal hydroureter without discrete lesion or obstructing etiology at the transition point of the mid ureter. Cycle 1 FOLFIRI/Panitumumab 03/12/2019 Cycle 2 FOLFIRI/Panitumumab 03/26/2019-bolus 5-FU and irinotecan held secondary to neutropenia Cycle 3 FOLFIRI/Panitumumab 04/09/2019, Udenyca added-not given secondary to seizure/discontinuation of the 5-FU pump CT abdomen/pelvis at [facility] 05/04/2019-no residual fluid collection at the left abdominal wall abscess, stable moderate left hydronephrosis, multifocal indeterminate liver lesions--liver lesions significantly improved Cycle 4 FOLFIRI/Panitumumab 05/07/2019, Udenyca Cycle 5 FOLFIRI/Panitumumab 05/20/2019, Udenyca Cycle 6 FOLFIRI/Panitumumab 06/18/2019, Udenyca Cycle 7 FOLFIRI/Panitumumab 07/01/2019 (Irinotecan dose reduced due to thrombocytopenia), Udenyca--Udenyca was not given Cycle 8 FOLFIRI/Panitumumab 07/15/2019, Udenyca Cycle 9 FOLFIRI/Panitumumab 07/29/2019, Udenyca Cycle 10 FOLFIRI/Panitumumab 08/13/2019, Udenyca CTs at  [facility] 08/19/2019-decreased size of the hypodense and hyperdense lesions within segment 2 of the left hepatic lobe.  No new lesions identified.  Similar presacral soft tissue thickening with adjacent stable alignment in the rectum.  Improved but persistent left UPJ obstruction with mild left hydronephrosis.  Persistent but decreased size of the wound within the left mid abdomen abdominal wall. Cycle 11 irinotecan/Panitumumab 08/27/2019, Udenyca Cycle 12 irinotecan/Panitumumab 10/08/2019, Udenyca Cycle 13 irinotecan/Panitumumab 11/05/2019, Udenyca CT abdomen/pelvis 11/19/2019-no new or progressive interval findings.  Stable appearance of the calcified lesion in the anterior left hepatic dome with second tiny low-density lesion in the dome of the lateral segment left liver.  Both lesions are markedly decreased since 02/27/2019.  Stable mild fullness left intrarenal collecting system and renal pelvis.  Similar appearance of abnormal soft tissue in the left paramidline subcutaneous fat with associated skin thickening, this likely correlates to the fistula. Cycle 14 irinotecan/Panitumumab 11/19/2019, Udenyca Cycle 15 irinotecan/Panitumumab 12/02/2019, Udenyca CT abdomen/pelvis at [facility] 02/11/2020- similar to slight decrease in segment 2 hypodense and hyperdense lesions (compared to a CT from July 2020), no new lesions, splenomegaly, no fluid collection or abscess, mild stranding adjacent to the incision with overlying skin thickening Cycle 16 irinotecan/Panitumumab 03/03/2020, Udenyca Cycle 17 irinotecan/Panitumumab 03/17/2020, Udenyca Cycle 18 irinotecan/panitumumab 04/07/2020, Udenyca Cycle 19 irinotecan/Panitumumab 04/28/2020, Udenyca Cycle 20 irinotecan/panitumumab 05/26/2020, Udenyca Cycle 21 irinotecan/panitumumab 06/30/2020, Udenyca CT abdomen/pelvis 07/19/2020-mildly increased hepatic steatosis.  Increased size of 2.8 cm hypervascular lesion segment 2 left hepatic lobe. Cycle 22 irinotecan/Panitumumab  07/21/2020, Udenyca MRI Abd 07/28/2020: liver lesions consistent with hepatic metastasis Cycle 23 irinotecan/Panitumumab 08/04/2020, Udenyca  Cycle 24 irinotecan/panitumumab 08/22/2020 Irinotecan/panitumumab discontinued secondary to diarrhea and skin toxicity Guardant 360 on 09/12/2020-K-ras G12V, PIK3CA,NTRK2-VUS, tumor mutation burden 6.85, MSI-high not detected Referred for consideration of Y 90 CTs 10/11/2020-for enlarging hepatic metastatic lesions identified.  No new lesions.  Several tiny nodules in the lungs in the 2 to 3 mm  diameter range.  Mild splenomegaly.  Mild left hydronephrosis. 11/17/2020-Y90 lateral segment left lobe of the liver 12/08/2020-Y 90 treatment to the right liver CTs 03/16/2021-multiple new small pulmonary nodules.  Response to therapy in the liver, decreasing size of liver lesions with signs of interval radioembolization; findings suspicious for perianastomotic recurrence in the pelvis; new small volume ascites and some right colonic thickening as well as retroperitoneal thickening. Cycle 1 FOLFOX 04/10/2021 Cycle 2 FOLFOX 04/24/2021 CT abdomen/pelvis 05/06/2021-increased ascites, stable hypodense liver lesions, splenomegaly, soft tissue density at the left pelvis-stable or decreased in size by my measurement Cycle 3 FOLFOX 06/05/2021 FOLFOX discontinued secondary to poor tolerance with fever requiring repeat hospitalization Cycle 1 Lonsurf 06/26/2021, not a candidate for bevacizumab secondary to esophageal varices Cycle 2 Lonsurf 07/24/2021 Cycle 3 Lonsurf 08/28/2021, discontinued at office visit 09/07/2021 CTs 08/30/2021-increased size and number of hepatic metastases, increased loculated ascites, mild increase in soft tissue prominence at the left lateral rectal wall, no change in subcentimeter bilateral pulmonary nodules Mild elevation of the CEA beginning January 2016 , normal on 05/19/2014   History of iron deficiency anemia   seizure disorder; seizure 04/09/2019.  Brain CT negative.   Now on Keppra. history of depression   4 mm right lower lobe nodule on a staging chest CT 09/08/2013 , stable on a CT 04/07/2014  Hospitalization 09/24/2013 through 09/26/2013 with fever and abdominal pain.   09/24/2013 urine culture positive for coag negative staph.   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  Scope advanced for approximately 25 cm.  Most proximal portion had necrotic appearing debris.  Colostomy bag insufflated suggesting some type of communication between the pouch and the proximal colon.  Introduction of scope into the ostomy found to available directions.  1 toward the distal pouch with similar appearing necrotic debris encountered.  The other was about a 30 cm segment of normal-appearing colonic mucosa to the level of the previous right hemicolectomy ileocolonic anastomosis. 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  No malignancy.   16. COVID-19 + 09/10/2019 17. Hospitalized with seizure 09/17/2019 through 09/19/2019-MRI brain without evidence of metastatic disease. Seen  by neurology. Keppra dose increased. 19.  Rash secondary to Panitumumab 20.  COVID-19 09/22/2020 21.  Admission 04/24/2021 with a fever-no source for infection identified 22.  Ascites-paracentesis, cytology 05/09/2021, 05/15/2021, 05/30/2021-negative 23.  Admission 06/06/2021 with fever-no source for infection identified 24.  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  Stomach with diffuse portal hypertensive gastropathy.  Mild contact friability.     Disposition: [name] has metastatic colon cancer.  She has completed 2 cycles of Lonsurf and is currently completing cycle 3.  Her overall performance status has declined.  The CEA was higher when she was here on 08/11/2021.  I discussed the restaging CT findings and reviewed the images  with her.  There appears to be mild evidence of disease progression in the liver and possibly at the pelvic mass.  There was a greater than 1 month interval between the baseline CT and initiation of Lonsurf.  It is difficult to know whether the colon cancer is progressing on Lonsurf.  The CEA was lower after the salvage FOLFOX therapy.  She had difficulty tolerating FOLFOX secondary to fever.  I discussed treatment options with [name].  We discussed continuing Lonsurf with a short interval follow-up CT, resuming FOLFOX, referral for clinical trial, and comfort care.  She does not wish to resume FOLFOX or continue Lonsurf.  She prefers comfort care.  She agrees to a hospice referral.  We discussed CODE STATUS.  She will be placed on a no CODE BLUE status.  I refilled her prescription for oxycodone.  She will return for an office visit in 3-4 weeks. Betsy Coder, MD  09/07/2021  8:45 AM

## 2021-09-08 ENCOUNTER — Other Ambulatory Visit: Payer: Self-pay | Admitting: *Deleted

## 2021-09-08 DIAGNOSIS — C787 Secondary malignant neoplasm of liver and intrahepatic bile duct: Secondary | ICD-10-CM

## 2021-09-08 DIAGNOSIS — C182 Malignant neoplasm of ascending colon: Secondary | ICD-10-CM

## 2021-09-08 MED ORDER — LORAZEPAM 0.5 MG PO TABS: 0.5000 mg | ORAL_TABLET | Freq: Four times a day (QID) | ORAL | 0 refills | Status: DC | PRN

## 2021-09-08 MED ORDER — PROCHLORPERAZINE MALEATE 10 MG PO TABS: 10.0000 mg | ORAL_TABLET | Freq: Four times a day (QID) | ORAL | 1 refills | Status: DC | PRN

## 2021-09-08 NOTE — Telephone Encounter (Signed)
Patient needs refill on prochlorperazine and lorazepam and asking if she can have lorazepam more often than bid? OK for every 6 hours prn.

## 2021-09-11 ENCOUNTER — Other Ambulatory Visit (HOSPITAL_COMMUNITY): Payer: Self-pay

## 2021-09-12 ENCOUNTER — Ambulatory Visit (HOSPITAL_BASED_OUTPATIENT_CLINIC_OR_DEPARTMENT_OTHER): Payer: BC Managed Care – PPO | Admitting: Family Medicine

## 2021-09-13 ENCOUNTER — Other Ambulatory Visit (HOSPITAL_COMMUNITY): Payer: Self-pay

## 2021-09-14 ENCOUNTER — Encounter: Payer: Self-pay | Admitting: Oncology

## 2021-09-15 ENCOUNTER — Telehealth: Payer: Self-pay

## 2021-09-15 NOTE — Telephone Encounter (Signed)
TC from hospice Nurse Arbie Cookey from [facility] [phone] Nurse stated Pt is stating she is having crying spells. Nurse states she is not depressed, but states she is having problems sleeping. She states she is already on lorazepam 0.5 but its not strong enough. Nurse Arbie Cookey would like parameters placed on her lorazepam 0.5-1 mg as needed. Discussed with Dr Benay Spice who gave verbal order to change prescription parameters to 0.5-1 mg q 6 prn as needed. Verbal read back of orders Nurse Arbie Cookey verbalized understanding.

## 2021-09-19 ENCOUNTER — Other Ambulatory Visit (HOSPITAL_BASED_OUTPATIENT_CLINIC_OR_DEPARTMENT_OTHER): Payer: Self-pay | Admitting: Family Medicine

## 2021-09-20 ENCOUNTER — Encounter: Payer: Self-pay | Admitting: Oncology

## 2021-09-21 ENCOUNTER — Encounter: Payer: Self-pay | Admitting: Oncology

## 2021-09-22 ENCOUNTER — Encounter: Payer: Self-pay | Admitting: Oncology

## 2021-09-22 ENCOUNTER — Other Ambulatory Visit: Payer: Self-pay

## 2021-09-22 MED ORDER — MIRTAZAPINE 15 MG PO TABS: 15.0000 mg | ORAL_TABLET | Freq: Every day | ORAL | 1 refills | Status: DC

## 2021-09-22 MED ORDER — DICLOFENAC SODIUM 1 % EX GEL: 2.0000 g | Freq: Four times a day (QID) | CUTANEOUS | 1 refills | Status: AC

## 2021-09-22 MED ORDER — TRIAMCINOLONE 0.1 % CREAM:EUCERIN CREAM 1:1: 1.0000 | TOPICAL_CREAM | Freq: Three times a day (TID) | CUTANEOUS | 1 refills | Status: DC | PRN

## 2021-09-26 ENCOUNTER — Inpatient Hospital Stay: Payer: Medicaid Other | Admitting: Licensed Clinical Social Worker

## 2021-09-26 ENCOUNTER — Telehealth: Payer: Self-pay | Admitting: *Deleted

## 2021-09-26 NOTE — Progress Notes (Signed)
[facility] CSW Progress Note  Clinical Education officer, museum contacted caregiver by phone to assess comfort and needs.  Patient was sleeping and CSW spoke with her mother, Janae Bridgeman.  Arlene reported [facility] is now providing care for patient.  A nurse is currently seeing patient every Friday.  They are trying to manage patient's pain which is intermittent.  Arlene expressed having difficulty coping with her daughter's decline, but has strong.  CSW offered active listening and supportive counseling.   Rodman Pickle [place] Hair, LCSW

## 2021-09-26 NOTE — Telephone Encounter (Signed)
Call from [facility] nurse with following requests for [name]: Refill needed on oxycodone 5 mg Asking for order for triamcinolone 0.1% cream for her anal area irritation from loose stools. No OTC helps. Has been using her husband's cream and reports this is the only thing that helps. All scripts to [facility].

## 2021-09-27 ENCOUNTER — Other Ambulatory Visit: Payer: Self-pay | Admitting: Nurse Practitioner

## 2021-09-27 ENCOUNTER — Other Ambulatory Visit: Payer: Self-pay | Admitting: *Deleted

## 2021-09-27 DIAGNOSIS — C189 Malignant neoplasm of colon, unspecified: Secondary | ICD-10-CM

## 2021-09-27 MED ORDER — OXYCODONE HCL 5 MG PO TABS: 5.0000 mg | ORAL_TABLET | ORAL | 0 refills | Status: DC | PRN

## 2021-09-28 ENCOUNTER — Encounter: Payer: Self-pay | Admitting: *Deleted

## 2021-09-28 ENCOUNTER — Ambulatory Visit: Payer: Self-pay | Admitting: *Deleted

## 2021-09-28 NOTE — Patient Outreach (Signed)
  Care Coordination   Follow Up Visit Note   09/28/2021 Name: BRIGETTA BECKSTROM MRN: [id number] DOB: August 18, 1975  MIKAIYA TRAMBLE is a 46 y.o. year old female who sees [name], [name], MD for primary care. I spoke with  Mayer Masker by phone today  What matters to the patients health and wellness today?  Being comfortable and at peace.   Pt is now on Hospice services ([place]). All her needs are being addressed.  SDOH assessments and interventions completed:  No   Care Coordination Interventions Activated:  No  Care Coordination Interventions:  No, not indicated   Follow up plan: Follow up call scheduled for 2 weeks for support.    Encounter Outcome:  Pt. Visit Completed

## 2021-09-29 ENCOUNTER — Telehealth: Payer: Self-pay

## 2021-09-29 NOTE — Telephone Encounter (Signed)
[name] from [facility] called and stated [name] is having severe pain and taking oxycodone 5-10 mg 1-2 tabs every 4 with out any relief. She wanted to increase  the dose. Per Lattie Haw she can take 5-15 mg 2-3 tab every 3-4 hour of oxycodone.

## 2021-10-05 ENCOUNTER — Encounter: Payer: Self-pay | Admitting: Nurse Practitioner

## 2021-10-05 ENCOUNTER — Inpatient Hospital Stay (HOSPITAL_BASED_OUTPATIENT_CLINIC_OR_DEPARTMENT_OTHER): Payer: Medicaid Other | Admitting: Nurse Practitioner

## 2021-10-05 ENCOUNTER — Inpatient Hospital Stay: Payer: Medicaid Other

## 2021-10-05 VITALS — BP 118/76 | HR 82 | Temp 98.1°F | Resp 20 | Ht 64.5 in | Wt 132.2 lb

## 2021-10-05 DIAGNOSIS — C189 Malignant neoplasm of colon, unspecified: Secondary | ICD-10-CM

## 2021-10-05 DIAGNOSIS — C182 Malignant neoplasm of ascending colon: Secondary | ICD-10-CM | POA: Diagnosis not present

## 2021-10-05 DIAGNOSIS — C787 Secondary malignant neoplasm of liver and intrahepatic bile duct: Secondary | ICD-10-CM

## 2021-10-05 DIAGNOSIS — Z95828 Presence of other vascular implants and grafts: Secondary | ICD-10-CM

## 2021-10-05 MED ORDER — TRIAMCINOLONE ACETONIDE 0.1 % EX CREA: 1.0000 | TOPICAL_CREAM | Freq: Three times a day (TID) | CUTANEOUS | 2 refills | Status: AC | PRN

## 2021-10-05 MED ORDER — SODIUM CHLORIDE 0.9% FLUSH
10.0000 mL | Freq: Once | INTRAVENOUS | Status: AC
  Administered 2021-10-05: 10 mL

## 2021-10-05 MED ORDER — MORPHINE SULFATE ER 30 MG PO TBCR: 30.0000 mg | EXTENDED_RELEASE_TABLET | Freq: Two times a day (BID) | ORAL | 0 refills | Status: DC

## 2021-10-05 MED ORDER — HEPARIN SOD (PORK) LOCK FLUSH 100 UNIT/ML IV SOLN
500.0000 [IU] | Freq: Once | INTRAVENOUS | Status: AC
  Administered 2021-10-05: 500 [IU]

## 2021-10-05 NOTE — Progress Notes (Signed)
[name] OFFICE PROGRESS NOTE   Diagnosis: Colon cancer  INTERVAL HISTORY:   [name] returns as scheduled.  She is being followed by home hospice.  She is currently taking 10 to 15 mg of oxycodone every 4 hours consistently.  Pain is in various locations.  She wonders if she could try a long-acting pain medication.  Oral intake varies.  She has periodic nausea with some vomiting.  Bowels not moving regularly.  Objective:  Vital signs in last 24 hours:  Blood pressure 118/76, pulse 82, temperature 98.1 F (36.7 C), temperature source Oral, resp. rate 20, height 5' 4.5" (1.638 m), weight 132 lb 3.2 oz (60 kg), SpO2 100 %.    HEENT: No thrush or ulcers. Resp: Lungs clear bilaterally. Cardio: Regular rate and rhythm. GI: Abdomen is distended with firmness at the lower abdomen.  No hepatomegaly. Vascular: No leg edema.  Port-A-Cath without erythema.  Lab Results:  Lab Results  Component Value Date   WBC 3.0 (L) 09/07/2021   HGB 9.2 (L) 09/07/2021   HCT 28.4 (L) 09/07/2021   MCV 93.7 09/07/2021   PLT 130 (L) 09/07/2021   NEUTROABS 2.2 09/07/2021    Imaging:  No results found.  Medications: I have reviewed the patient's current medications.  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 The tumor returned microsatellite stable with no loss of mismatch repair protein expression   APC mutated. No BRAF, KRAS, or NRAS mutation On Foundation 1 testing   Cycle 1 adjuvant FOLFOX 09/08/2013   Cycle 2 adjuvant FOLFOX 09/24/2013   Cycle 3 adjuvant FOLFOX 10/08/2013.   Cycle 4 adjuvant FOLFOX 10/22/2013.   Cycle 5 adjuvant FOLFOX 11/05/2013. Oxaliplatin held due to thrombocytopenia. Cycle 6 FOLFOX 11/19/2013. Cycle 7 FOLFOX 12/03/2013. Oxaliplatin held secondary to thrombocytopenia. Cycle 8 FOLFOX 12/17/2013. Cycle 9 FOLFOX 01/04/2014.  Oxaliplatin held secondary to neutropenia.   Cycle 10 FOLFOX 01/21/2014. Oxaliplatin held secondary to thrombocytopenia. Cycle 11 FOLFOX 02/04/2014 Cycle 12 FOLFOX 02/18/2014, oxaliplatin dose reduced secondary tothrombocytopenia CT abdomen/pelvis 01/30/2014 revealed splenomegaly and no evidence of recurrent colon cancer CT chest 04/07/2014 with a stable right lower lobe nodule and no evidence for metastatic disease, no nodules seen on the CT 11/26/2014 Markedly elevated CEA 11/24/2014 CT 11/26/2014 revealed a right pelvic mass, splenomegaly, small volume ascites Right salpingo-oophorectomy 12/28/2014 with the pathology confirming metastatic colon cancer CTs 03/23/2016-no evidence of recurrent or metastatic disease. CT 11/26/2016-enlargement of a fluid density structure the right pelvic sidewall, no other evidence of metastatic disease CT aspiration right pelvic cyst 12/19/2016.  Cytology-BENIGN REACTIVE/REPARATIVE CHANGES. CTs 06/05/2017- no evidence of metastatic disease, mild cirrhotic changes with splenomegaly CTs 12/11/2017- recurrent cystic right adnexal mass, similar to on the CT 11/26/2016, stable mild splenomegaly CTs 04/23/2018- enlargement of cystic right adnexal mass with mural nodularity, no other evidence of metastatic disease Cytoreductive surgery/HIPEC with mitomycin by Dr. Clovis Riley at [facility] 08/12/2018-R1 resection achieved.  Cytoreduction included omentectomy, LAR, right salpingo-oophorectomy and left colonic gutter/pelvic stripping.  Pathology on the rectum showed recurrent/metastatic adenocarcinoma, tumor 2.0 cm, predominantly involving the subserosa and muscularis propria of the colon, proximal and distal margins of resection were negative, vascular invasion present, metastatic carcinoma present in 1 out of 5 lymph nodes; omentum resection with no malignancy seen, no metastatic carcinoma identified in 1 lymph node examined; left gutter stripping positive metastatic adenocarcinoma; right  ovary resection positive metastatic adenocarcinoma. 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 CEA 68 02/03/2019 Biopsy liver lesion 02/11/2019-metastatic adenocarcinoma consistent with primary colonic adenocarcinoma CTs 02/27/2019-multiple liver lesions increased in size, new lesion in the lateral right lobe of the liver.  No evidence of metastatic disease in the chest.  Redemonstrated moderate left hydronephrosis and proximal hydroureter without discrete lesion or obstructing etiology at the transition point of the mid ureter. Cycle 1 FOLFIRI/Panitumumab 03/12/2019 Cycle 2 FOLFIRI/Panitumumab 03/26/2019-bolus 5-FU and irinotecan held secondary to neutropenia Cycle 3 FOLFIRI/Panitumumab 04/09/2019, Udenyca added-not given secondary to seizure/discontinuation of the 5-FU pump CT abdomen/pelvis at [facility] 05/04/2019-no residual fluid collection at the left abdominal wall abscess, stable moderate left hydronephrosis, multifocal indeterminate liver lesions--liver lesions significantly improved Cycle 4 FOLFIRI/Panitumumab 05/07/2019, Udenyca Cycle 5 FOLFIRI/Panitumumab 05/20/2019, Udenyca Cycle 6 FOLFIRI/Panitumumab 06/18/2019, Udenyca Cycle 7 FOLFIRI/Panitumumab 07/01/2019 (Irinotecan dose reduced due to thrombocytopenia), Udenyca--Udenyca was not given Cycle 8 FOLFIRI/Panitumumab 07/15/2019, Udenyca Cycle 9 FOLFIRI/Panitumumab 07/29/2019, Udenyca Cycle 10 FOLFIRI/Panitumumab 08/13/2019, Udenyca CTs at [facility] 08/19/2019-decreased size of the hypodense and hyperdense lesions within segment 2 of the left hepatic lobe.  No new lesions identified.  Similar presacral soft tissue thickening with adjacent stable alignment in the rectum.  Improved but persistent left UPJ obstruction with mild left hydronephrosis.  Persistent but decreased size of the wound within the left mid abdomen abdominal wall. Cycle 11 irinotecan/Panitumumab 08/27/2019,  Udenyca Cycle 12 irinotecan/Panitumumab 10/08/2019, Udenyca Cycle 13 irinotecan/Panitumumab 11/05/2019, Udenyca CT abdomen/pelvis 11/19/2019-no new or progressive interval findings.  Stable appearance of the calcified lesion in the anterior left hepatic dome with second tiny low-density lesion in the dome of the lateral segment left liver.  Both lesions are markedly decreased since 02/27/2019.  Stable mild fullness left intrarenal collecting system and renal pelvis.  Similar appearance of abnormal soft tissue in the left paramidline subcutaneous fat with associated skin thickening, this likely correlates to the fistula. Cycle 14 irinotecan/Panitumumab 11/19/2019, Udenyca Cycle 15 irinotecan/Panitumumab 12/02/2019, Udenyca CT abdomen/pelvis at [facility] 02/11/2020- similar to slight decrease in segment 2 hypodense and hyperdense lesions (compared to a CT from July 2020), no new lesions, splenomegaly, no fluid collection or abscess, mild stranding adjacent to the incision with overlying skin thickening Cycle 16 irinotecan/Panitumumab 03/03/2020, Udenyca Cycle 17 irinotecan/Panitumumab 03/17/2020, Udenyca Cycle 18 irinotecan/panitumumab 04/07/2020, Udenyca Cycle 19 irinotecan/Panitumumab 04/28/2020, Udenyca Cycle 20 irinotecan/panitumumab 05/26/2020, Udenyca Cycle 21 irinotecan/panitumumab 06/30/2020, Udenyca CT abdomen/pelvis 07/19/2020-mildly increased hepatic steatosis.  Increased size of 2.8 cm hypervascular lesion segment 2 left hepatic lobe. Cycle 22 irinotecan/Panitumumab 07/21/2020, Udenyca MRI Abd 07/28/2020: liver lesions consistent with hepatic metastasis Cycle 23 irinotecan/Panitumumab 08/04/2020, Udenyca  Cycle 24 irinotecan/panitumumab 08/22/2020 Irinotecan/panitumumab discontinued secondary to diarrhea and skin toxicity Guardant 360 on 09/12/2020-K-ras G12V, PIK3CA,NTRK2-VUS, tumor mutation burden 6.85, MSI-high not detected Referred for consideration of Y 90 CTs 10/11/2020-for enlarging hepatic  metastatic lesions identified.  No new lesions.  Several tiny nodules in the lungs in the 2 to 3 mm diameter range.  Mild splenomegaly.  Mild left hydronephrosis. 11/17/2020-Y90 lateral segment left lobe of the liver 12/08/2020-Y 90 treatment to the right liver CTs 03/16/2021-multiple new small pulmonary nodules.  Response to therapy in the liver, decreasing size of liver lesions with signs of interval radioembolization; findings suspicious for perianastomotic recurrence in the pelvis; new small volume ascites and some right colonic thickening as well as retroperitoneal thickening. Cycle 1 FOLFOX 04/10/2021 Cycle 2 FOLFOX 04/24/2021 CT abdomen/pelvis 05/06/2021-increased ascites, stable hypodense liver lesions, splenomegaly, soft tissue density at the left pelvis-stable or decreased in size by my measurement Cycle  3 FOLFOX 06/05/2021 FOLFOX discontinued secondary to poor tolerance with fever requiring repeat hospitalization Cycle 1 Lonsurf 06/26/2021, not a candidate for bevacizumab secondary to esophageal varices Cycle 2 Lonsurf 07/24/2021 Cycle 3 Lonsurf 08/28/2021, discontinued at office visit 09/07/2021 CTs 08/30/2021-increased size and number of hepatic metastases, increased loculated ascites, mild increase in soft tissue prominence at the left lateral rectal wall, no change in subcentimeter bilateral pulmonary nodules Mild elevation of the CEA beginning January 2016 , normal on 05/19/2014   History of iron deficiency anemia   seizure disorder; seizure 04/09/2019.  Brain CT negative.  Now on Keppra. history of depression   4 mm right lower lobe nodule on a staging chest CT 09/08/2013 , stable on a CT 04/07/2014  Hospitalization 09/24/2013 through 09/26/2013 with fever and abdominal pain.   09/24/2013 urine culture positive for coag negative staph.   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  Scope advanced for approximately 25 cm.  Most proximal portion had necrotic appearing debris.  Colostomy bag insufflated suggesting some type of communication between the pouch and the proximal colon.  Introduction of scope into the ostomy found to available directions.  1 toward the distal pouch with similar appearing necrotic debris encountered.  The other was about a 30 cm segment of normal-appearing colonic mucosa to the level of the previous right hemicolectomy ileocolonic anastomosis. Port-A-Cath placement interventional radiology 03/05/2019 Left leg/foot weakness-brain MRI 09/03/2019 with no evidence of metastatic disease, referred to physical therapy Possible abdominal wall abscess status post evaluation by surgery at [facility] 04/03/2019, antibiotics initiated; incision and drainage with purulent material removed 04/22/2019 CT at [facility] 05/04/2019-no fluid collection at the site of the left abdominal wall abscess, "indeterminate" liver lesions CT at [facility] 06/04/2019-persistent open wound of the left abdomen, 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  No malignancy.   16. COVID-19 + 09/10/2019 17. Hospitalized with seizure 09/17/2019 through 09/19/2019-MRI brain without evidence of metastatic disease. Seen by neurology. Keppra dose increased. 19.  Rash secondary to Panitumumab 20.  COVID-19 09/22/2020 21.  Admission 04/24/2021 with a fever-no source for infection identified 22.  Ascites-paracentesis, cytology 05/09/2021, 05/15/2021, 05/30/2021-negative 23.  Admission 06/06/2021 with fever-no source for infection identified 24.  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  Stomach with  diffuse portal hypertensive gastropathy.  Mild contact friability.    Disposition: [name] has metastatic colon cancer.  She is now followed by the home hospice program.  Performance status is slowly declining.  She is taking oxycodone around-the-clock for pain control.  We talked about a long-acting pain medication.  She would like to try this.  Prescription will be sent to her pharmacy for MS Contin 30 mg every 12 hours.  She understands to continue oxycodone for breakthrough pain.  She will begin a stool softener and laxative regimen.  She will return for a port flush and follow-up in 6 weeks.  We are available to see her sooner if needed.    Ned Card ANP/GNP-BC   10/05/2021  11:38 AM

## 2021-10-05 NOTE — Progress Notes (Signed)
NEUROLOGY FOLLOW UP OFFICE NOTE  [name] [id number]  Assessment/Plan:   Primary generalized seizures Restless leg syndrome Tremor - not appreciated on today's exam.  No other signs to suggest Parkinson's disease.  Unlikely to be related to chemotherapy, as she finished it back in July.  Probably essential tremor.  Refill lacosamide 272m twice daily Given her constipation, would not start ferrous sulfate.  She may treat the restless leg symptoms as needed with the hydrocodone or lorazepam. Follow up 9 months.   Subjective:  Emily Croon BSchlatteris a 89old female with metastatic colon cancer, depression, anxiety, migraine and restless leg syndrome who follows up for primary generalized epilepsy.   UPDATE: Current medications:  Vimpat 2061mBID, B12 100018mdaily, ferrous sulfate 325m17mily, lorazepam 0.5mg 71m PRN (anxiety), mirtazapine 15mg 1m morphine, oxycodone  She is now in hospice care.  No seizures.  RLS off and on.  Ferritin level in January was 11.1.  Advised to start ferrous sulfate but forgot to start..  B12 was 829.  Will take oxycodone from time to  To further evaluate tremors, TSH was checked in January which was 3.14.  HISTORY: Seizure Disorder: She began having seizures in 2009.  There was no precipitating factor.  Her seizures present as generalized tonic-clonic.  Reportedly, she has had several seizures while asleep, as she sometimes wakes up having bit her tongue but no blood on the pillow.  No witnesses to seizure activity at night.  She usually has migraine the next day.  MRI of brain with and without contrast on 09/10/2008 was unremarkable.  EEG from 11/08/2010 showed generalized polyspike and wave discharged.     She established care with me in July 2014 after having a seizure.  At the time, she was on Keppra XR 1500mg t64m daily.  Due to mood changes, she was switched to lamotrigine.  She was last seen by me in July 2015.  She had a lot going on and  subsequently stopped her medication and was lost to follow-up.  She was seizure-free until March 2021 when she had a seizure.  It was suspected that it may have been provoked by Levaquin which she was taking to treat an abscess from her colostomy reversal.  EEG showed generalized epileptiform activity.  She was restarted on Keppra 500mg tw76mdaily.  MRI of brain with and without contrast on 09/03/2019 personally reviewed was unremarkable.  She was diagnosed with COVID-19 in August 2021.  A week later, she was admitted to the hospital for multiple seizures, diagnosed with status epilepticus. Limited MRI of brain without contrast was unremarkable.  EEG showed generalized and left frontotemporal spikes and polyspike waves.  Keppra was increased to 1500mg twi69maily but caused irritability.  Restless Leg Syndrome: She also has restless leg syndrome.  She began having worsening of restless legs in 2022  Labs in September 2022 revealed B12 of 146 and ferritin level of 14.  She was advised to start B12 1000mcg dai65mnd ferrous sulfate 325mg daily55mdney function was normal.  Repeat ferritin level in December was 25.  Since starting the supplements, restless leg is better controlled.  She has mild constipation on ferrous sulfate but not significant.    Tremors: tremors in her hands.  She notices it when she gets her nails done.  She also may shake and drop a hair brush when she is cutting hair     Previous medications include Keppra (irritability) and phenobarbital, but she cannot  recall if she had been on anything else.  Records mention carbamazepine.  Lamotrigine.  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 (Patient not taking: Reported on 09/07/2021)     blood glucose meter kit and supplies Dispense based on patient and insurance preference. Use up to four times daily as directed. (FOR ICD-10 E10.9, E11.9). 1 each 1   diclofenac Sodium (VOLTAREN) 1 % GEL Apply 2 g topically 4 (four) times daily. 4 g 1   diphenhydrAMINE (BENADRYL) 25 mg capsule Take 25 mg by mouth at bedtime.     docusate sodium (COLACE) 100 MG capsule Take 1 capsule (100 mg total) by mouth every 12 (twelve) hours. 20 capsule 0   furosemide (LASIX) 20 MG tablet Take 1 tablet (20 mg total) by mouth daily. (Patient not taking: Reported on 09/07/2021) 30 tablet 1   hydrOXYzine (ATARAX) 25 MG tablet Take 1 tablet (25 mg total) by mouth at bedtime. 30 tablet 5   lacosamide (VIMPAT) 200 MG TABS tablet Take 1 tablet by mouth twice daily 60 tablet 0   loperamide (IMODIUM) 2 MG capsule Take 2 mg by mouth as needed for diarrhea or loose stools.     LORazepam (ATIVAN) 0.5 MG tablet Take 1 tablet (0.5 mg total) by mouth every 6 (six) hours as needed. for anxiety 60 tablet 0   magnesium oxide (MAG-OX) 400 (240 Mg) MG tablet Take 1 tablet (400 mg total) by mouth 2 (two) times daily. 60 tablet 3   metFORMIN (GLUCOPHAGE) 500 MG tablet TAKE 1 TABLET BY MOUTH TWICE DAILY WITH A MEAL 180 tablet 0   mirtazapine (REMERON) 15 MG tablet Take 1 tablet (15 mg total) by mouth at bedtime. 30 tablet 1   oxyCODONE (ROXICODONE) 5 MG immediate  release tablet Take 1-2 tablets (5-10 mg total) by mouth every 4 (four) hours as needed for severe pain. 100 tablet 0   pantoprazole (PROTONIX) 40 MG tablet Take 1 tablet by mouth twice daily 60 tablet 2   potassium chloride (MICRO-K) 10 MEQ CR capsule Take 1 capsule by mouth once daily 90 capsule 0   prochlorperazine (COMPAZINE) 10 MG tablet Take 1 tablet (10 mg total) by mouth every 6 (six) hours as needed for nausea. 60 tablet 1   spironolactone (ALDACTONE) 50 MG tablet Take 1 tablet by mouth once daily 30 tablet 2   Triamcinolone Acetonide (TRIAMCINOLONE 0.1 % CREAM : EUCERIN) CREA Apply 1 Application topically 3 (three) times daily as needed. 2 each 1   trifluridine-tipiracil (LONSURF) 15-6.14 MG tablet Take 3  tablets (45 mg of trifluridine total) by mouth 2 (two) times daily after a meal. Take within 1 hr of AM & PM meal. Take on days 1-5, 8-12. Repeat every 28 day.Start cycle on 08/21/2021 60 tablet 0   No current facility-administered medications on file prior to visit.    ALLERGIES: Allergies  Allergen Reactions   Adhesive [Tape] Hives, Shortness Of Breath and Swelling    Tolerates paper tape   Clindamycin/Lincomycin Itching, Rash and Other (See Comments)    Generalized body rash with itching, lip ulcers, seizures   Promethazine Other (See Comments)    Increase restless leg movement    Eggs Or Egg-Derived Products Nausea And Vomiting   Leucovorin Calcium Other (See Comments)    Severe chills, fever (See progress note from 06/05/21)   Metoclopramide Hcl Other (See Comments)    EXACERBATES RESTLESS LEG SYNDROME   Oxaliplatin Other (See Comments)    Severe chills, fever (See progress note from 06/05/21)   Oxycodone-Acetaminophen Hives    Takes OXIR at home   Sulfa Antibiotics Nausea And Vomiting   Fluogen [Influenza Virus Vaccine] Nausea And Vomiting   Levaquin [Levofloxacin]     seizures   Penicillins Itching    Has patient had a PCN reaction causing immediate rash,  facial/tongue/throat swelling, SOB or lightheadedness with hypotension: No Has patient had a PCN reaction causing severe rash involving mucus membranes or skin necrosis: No Has patient had a PCN reaction that required hospitalization No Has patient had a PCN reaction occurring within the last 10 years: No If all of the above answers are "NO", then may proceed with Cephalosporin use.     FAMILY HISTORY: Family History  Problem Relation Age of Onset   Colon polyps Mother        3+ colon polyps at each colonoscopy - "several"   Colitis Mother    Heart attack Mother    Diabetes Mother    Heart disease Mother    Uterine cancer Mother    Diabetes Father    Heart attack Father    Stroke Father    Congestive Heart Failure Father    Heart disease Father    Breast cancer Sister    Ulcerative colitis Sister    Other Paternal Grandfather        "bone cancer"   Cirrhosis Maternal Aunt    Breast cancer Maternal Aunt        dx 50s-60s; s/p lumpectomy   Dementia Maternal Aunt    Heart disease Maternal Aunt    Lung cancer Maternal Uncle        d. 40s; smoker and worked around asbestos   Colon cancer Maternal Uncle        dx early 48s   Dementia Paternal Aunt    Dementia Paternal Uncle    Prostate cancer Paternal Uncle 32   Other Cousin        maternal 1st cousin w/ "lung issues"   Esophageal cancer Neg Hx    Rectal cancer Neg Hx    Stomach cancer Neg Hx       Objective:  Blood pressure 139/83, pulse (!) 104, height _0  (1.626 m), weight 138 lb (62.6 kg), SpO2 99 %. General: No acute distress.  Patient appears well-groomed.   Head:  Normocephalic/atraumatic Eyes:  Fundi examined but not visualized Neck: supple, no paraspinal tenderness, full range of motion Heart:  Regular rate and rhythm Neurological Exam: alert and oriented to person, place, and time.  Speech fluent  and not dysarthric, language intact.  CN II-XII intact. Bulk and tone normal, no rigidity or bradykinesia;  postural and kinetic tremor of hands not appreciated; muscle strength 5/5 throughout.  Sensation to temperature and vibration intact.  Deep tendon reflexes 2+ throughout, toes downgoing.  Finger to nose testing intact.  Gait normal, Romberg with sway.   Metta Clines, DO  CC: Raymond [name], MD

## 2021-10-10 ENCOUNTER — Encounter: Payer: Self-pay | Admitting: Neurology

## 2021-10-10 ENCOUNTER — Encounter: Payer: Self-pay | Admitting: Oncology

## 2021-10-10 ENCOUNTER — Ambulatory Visit (INDEPENDENT_AMBULATORY_CARE_PROVIDER_SITE_OTHER): Payer: Medicaid Other | Admitting: Neurology

## 2021-10-10 VITALS — BP 139/83 | HR 104 | Ht 64.0 in | Wt 138.0 lb

## 2021-10-10 DIAGNOSIS — R251 Tremor, unspecified: Secondary | ICD-10-CM | POA: Diagnosis not present

## 2021-10-10 DIAGNOSIS — G40309 Generalized idiopathic epilepsy and epileptic syndromes, not intractable, without status epilepticus: Secondary | ICD-10-CM

## 2021-10-10 DIAGNOSIS — G2581 Restless legs syndrome: Secondary | ICD-10-CM | POA: Diagnosis not present

## 2021-10-10 MED ORDER — LACOSAMIDE 200 MG PO TABS: 200.0000 mg | ORAL_TABLET | Freq: Two times a day (BID) | ORAL | 5 refills | Status: AC

## 2021-10-10 NOTE — Patient Instructions (Signed)
Refilled lacosamide. Follow up in 9 months

## 2021-10-20 ENCOUNTER — Telehealth: Payer: Self-pay | Admitting: *Deleted

## 2021-10-20 NOTE — Telephone Encounter (Signed)
Hospice called to report a patient fall today. She was picking up a bag and lost balance and grabbed a chair with wheels to correct her and it rolled. She hit her right rib cage area. Has small bruise, but no pain.

## 2021-10-27 ENCOUNTER — Telehealth: Payer: Self-pay

## 2021-10-27 NOTE — Telephone Encounter (Signed)
Hospice called to report the patient notice blood clots coming out of her rectum. Arbie Cookey from [place] states she had about 6 or 7 small blood clots bright red when she had a bowel movement. The patient notice the blood clots about twice. Per Lattie Haw if the blood clots recurrent give [place] a call back.

## 2021-10-31 ENCOUNTER — Other Ambulatory Visit: Payer: Self-pay | Admitting: Nurse Practitioner

## 2021-10-31 ENCOUNTER — Telehealth: Payer: Self-pay

## 2021-10-31 DIAGNOSIS — C189 Malignant neoplasm of colon, unspecified: Secondary | ICD-10-CM

## 2021-10-31 MED ORDER — MORPHINE SULFATE ER 30 MG PO TBCR: 30.0000 mg | EXTENDED_RELEASE_TABLET | Freq: Two times a day (BID) | ORAL | 0 refills | Status: DC

## 2021-10-31 MED ORDER — MIRTAZAPINE 15 MG PO TABS: 15.0000 mg | ORAL_TABLET | Freq: Every day | ORAL | 1 refills | Status: DC

## 2021-10-31 NOTE — Telephone Encounter (Signed)
[facility] nurse called in and request a refill of the patient Ms Contin and Remeron. I placed the requested on Lisa's desk.

## 2021-11-03 ENCOUNTER — Encounter: Payer: Self-pay | Admitting: Oncology

## 2021-11-16 ENCOUNTER — Inpatient Hospital Stay (HOSPITAL_BASED_OUTPATIENT_CLINIC_OR_DEPARTMENT_OTHER): Payer: Medicaid Other | Admitting: Oncology

## 2021-11-16 ENCOUNTER — Inpatient Hospital Stay: Payer: Medicaid Other | Attending: Oncology

## 2021-11-16 DIAGNOSIS — M7989 Other specified soft tissue disorders: Secondary | ICD-10-CM | POA: Diagnosis not present

## 2021-11-16 DIAGNOSIS — C182 Malignant neoplasm of ascending colon: Secondary | ICD-10-CM | POA: Diagnosis present

## 2021-11-16 DIAGNOSIS — R188 Other ascites: Secondary | ICD-10-CM | POA: Diagnosis not present

## 2021-11-16 DIAGNOSIS — Z95828 Presence of other vascular implants and grafts: Secondary | ICD-10-CM

## 2021-11-16 DIAGNOSIS — C787 Secondary malignant neoplasm of liver and intrahepatic bile duct: Secondary | ICD-10-CM | POA: Diagnosis not present

## 2021-11-16 DIAGNOSIS — C7961 Secondary malignant neoplasm of right ovary: Secondary | ICD-10-CM | POA: Diagnosis present

## 2021-11-16 DIAGNOSIS — C189 Malignant neoplasm of colon, unspecified: Secondary | ICD-10-CM

## 2021-11-16 DIAGNOSIS — Z452 Encounter for adjustment and management of vascular access device: Secondary | ICD-10-CM | POA: Diagnosis not present

## 2021-11-16 MED ORDER — LORAZEPAM 0.5 MG PO TABS: 0.5000 mg | ORAL_TABLET | Freq: Four times a day (QID) | ORAL | 0 refills | Status: DC | PRN

## 2021-11-16 MED ORDER — HEPARIN SOD (PORK) LOCK FLUSH 100 UNIT/ML IV SOLN
500.0000 [IU] | Freq: Once | INTRAVENOUS | Status: AC
  Administered 2021-11-16: 500 [IU]

## 2021-11-16 MED ORDER — MIRTAZAPINE 30 MG PO TABS: 30.0000 mg | ORAL_TABLET | Freq: Every day | ORAL | 3 refills | Status: DC

## 2021-11-16 MED ORDER — SODIUM CHLORIDE 0.9% FLUSH
10.0000 mL | Freq: Once | INTRAVENOUS | Status: AC
  Administered 2021-11-16: 10 mL

## 2021-11-16 MED ORDER — MORPHINE SULFATE ER 30 MG PO TBCR: 30.0000 mg | EXTENDED_RELEASE_TABLET | Freq: Two times a day (BID) | ORAL | 0 refills | Status: DC

## 2021-11-16 NOTE — Patient Instructions (Signed)

## 2021-11-16 NOTE — Progress Notes (Signed)
[place] OFFICE PROGRESS NOTE   Diagnosis: Colon cancer  INTERVAL HISTORY:   [name] returns as scheduled.  She is enrolled in hospice care.  The hospice RN is visiting weekly.  She stays at home and in the bed most of the time.  Abdominal pain is controlled with MS Contin.  She takes oxycodone occasionally.  She has "hemorrhoids ".  She is using triamcinolone cream on the hemorrhoids.  She has developed lower leg and foot swelling.  She is no longer taking medication for diabetes and is not checking the blood sugar.  Objective:  Vital signs in last 24 hours:  Blood pressure 130/82, pulse 88, temperature 98.1 F (36.7 C), temperature source Temporal, resp. rate 18, weight 139 lb 3.2 oz (63.1 kg), SpO2 100 %.    HEENT: No thrush, geographic tongue Resp: Decreased breath sounds at the right lower posterior chest, no respiratory distress Cardio: Regular rate and rhythm GI: Distended, no discrete mass, no hepatomegaly Vascular: Trace pitting edema at the feet and lower legs bilaterally Rectal: Soft external hemorrhoids  Portacath/PICC-without erythema  Lab Results:  Lab Results  Component Value Date   WBC 3.0 (L) 09/07/2021   HGB 9.2 (L) 09/07/2021   HCT 28.4 (L) 09/07/2021   MCV 93.7 09/07/2021   PLT 130 (L) 09/07/2021   NEUTROABS 2.2 09/07/2021    CMP  Lab Results  Component Value Date   NA 134 (L) 09/07/2021   K 4.0 09/07/2021   CL 100 09/07/2021   CO2 25 09/07/2021   GLUCOSE 146 (H) 09/07/2021   BUN 8 09/07/2021   CREATININE 0.50 09/07/2021   CALCIUM 10.0 09/07/2021   PROT 7.9 09/07/2021   ALBUMIN 3.5 09/07/2021   AST 27 09/07/2021   ALT 11 09/07/2021   ALKPHOS 123 09/07/2021   BILITOT 1.5 (H) 09/07/2021   GFRNONAA >60 09/07/2021   GFRAA >60 11/05/2019    Lab Results  Component Value Date   CEA1 43.43 (H) 06/30/2020   CEA 413.77 (H) 09/07/2021   CA125 14 11/26/2014   Medications: I have reviewed the patient's current  medications.   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 The tumor returned microsatellite stable with no loss of mismatch repair protein expression   APC mutated. No BRAF, KRAS, or NRAS mutation On Foundation 1 testing   Cycle 1 adjuvant FOLFOX 09/08/2013   Cycle 2 adjuvant FOLFOX 09/24/2013   Cycle 3 adjuvant FOLFOX 10/08/2013.   Cycle 4 adjuvant FOLFOX 10/22/2013.   Cycle 5 adjuvant FOLFOX 11/05/2013. Oxaliplatin held due to thrombocytopenia. Cycle 6 FOLFOX 11/19/2013. Cycle 7 FOLFOX 12/03/2013. Oxaliplatin held secondary to thrombocytopenia. Cycle 8 FOLFOX 12/17/2013. Cycle 9 FOLFOX 01/04/2014. Oxaliplatin held secondary to neutropenia.   Cycle 10 FOLFOX 01/21/2014. Oxaliplatin held secondary to thrombocytopenia. Cycle 11 FOLFOX 02/04/2014 Cycle 12 FOLFOX 02/18/2014, oxaliplatin dose reduced secondary tothrombocytopenia CT abdomen/pelvis 01/30/2014 revealed splenomegaly and no evidence of recurrent colon cancer CT chest 04/07/2014 with a stable right lower lobe nodule and no evidence for metastatic disease, no nodules seen on the CT 11/26/2014 Markedly elevated CEA 11/24/2014 CT 11/26/2014 revealed a right pelvic mass, splenomegaly, small volume ascites Right salpingo-oophorectomy 12/28/2014 with the pathology confirming metastatic colon cancer CTs 03/23/2016-no evidence of recurrent or metastatic disease. CT 11/26/2016-enlargement of a fluid density structure the right pelvic sidewall, no other evidence of metastatic disease CT aspiration right pelvic cyst 12/19/2016.  Cytology-BENIGN REACTIVE/REPARATIVE CHANGES. CTs 06/05/2017- no  evidence of metastatic disease, mild cirrhotic changes with splenomegaly CTs 12/11/2017- recurrent cystic right adnexal mass, similar to on the CT 11/26/2016, stable mild splenomegaly CTs 04/23/2018- enlargement of cystic right adnexal mass  with mural nodularity, no other evidence of metastatic disease Cytoreductive surgery/HIPEC with mitomycin by Dr. Clovis Riley at [facility] 08/12/2018-R1 resection achieved.  Cytoreduction included omentectomy, LAR, right salpingo-oophorectomy and left colonic gutter/pelvic stripping.  Pathology on the rectum showed recurrent/metastatic adenocarcinoma, tumor 2.0 cm, predominantly involving the subserosa and muscularis propria of the colon, proximal and distal margins of resection were negative, vascular invasion present, metastatic carcinoma present in 1 out of 5 lymph nodes; omentum resection with no malignancy seen, no metastatic carcinoma identified in 1 lymph node examined; left gutter stripping positive metastatic adenocarcinoma; right ovary resection positive metastatic adenocarcinoma. 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 CEA 68 02/03/2019 Biopsy liver lesion 02/11/2019-metastatic adenocarcinoma consistent with primary colonic adenocarcinoma CTs 02/27/2019-multiple liver lesions increased in size, new lesion in the lateral right lobe of the liver.  No evidence of metastatic disease in the chest.  Redemonstrated moderate left hydronephrosis and proximal hydroureter without discrete lesion or obstructing etiology at the transition point of the mid ureter. Cycle 1 FOLFIRI/Panitumumab 03/12/2019 Cycle 2 FOLFIRI/Panitumumab 03/26/2019-bolus 5-FU and irinotecan held secondary to neutropenia Cycle 3 FOLFIRI/Panitumumab 04/09/2019, Udenyca added-not given secondary to seizure/discontinuation of the 5-FU pump CT abdomen/pelvis at [facility] 05/04/2019-no residual fluid collection at the left abdominal wall abscess, stable moderate left hydronephrosis, multifocal indeterminate liver lesions--liver lesions significantly improved Cycle 4 FOLFIRI/Panitumumab 05/07/2019, Udenyca Cycle 5 FOLFIRI/Panitumumab 05/20/2019, Udenyca Cycle 6 FOLFIRI/Panitumumab  06/18/2019, Udenyca Cycle 7 FOLFIRI/Panitumumab 07/01/2019 (Irinotecan dose reduced due to thrombocytopenia), Udenyca--Udenyca was not given Cycle 8 FOLFIRI/Panitumumab 07/15/2019, Udenyca Cycle 9 FOLFIRI/Panitumumab 07/29/2019, Udenyca Cycle 10 FOLFIRI/Panitumumab 08/13/2019, Udenyca CTs at [facility] 08/19/2019-decreased size of the hypodense and hyperdense lesions within segment 2 of the left hepatic lobe.  No new lesions identified.  Similar presacral soft tissue thickening with adjacent stable alignment in the rectum.  Improved but persistent left UPJ obstruction with mild left hydronephrosis.  Persistent but decreased size of the wound within the left mid abdomen abdominal wall. Cycle 11 irinotecan/Panitumumab 08/27/2019, Udenyca Cycle 12 irinotecan/Panitumumab 10/08/2019, Udenyca Cycle 13 irinotecan/Panitumumab 11/05/2019, Udenyca CT abdomen/pelvis 11/19/2019-no new or progressive interval findings.  Stable appearance of the calcified lesion in the anterior left hepatic dome with second tiny low-density lesion in the dome of the lateral segment left liver.  Both lesions are markedly decreased since 02/27/2019.  Stable mild fullness left intrarenal collecting system and renal pelvis.  Similar appearance of abnormal soft tissue in the left paramidline subcutaneous fat with associated skin thickening, this likely correlates to the fistula. Cycle 14 irinotecan/Panitumumab 11/19/2019, Udenyca Cycle 15 irinotecan/Panitumumab 12/02/2019, Udenyca CT abdomen[facility]pelvis at [facility] 02/11/2020- similar to slight decrease in segment 2 hypodense and hyperdense lesions (compared to a CT from July 2020), no new lesions, splenomegaly, no fluid collection or abscess, mild stranding adjacent to the incision with overlying skin thickening Cycle 16 irinotecan/Panitumumab 03/03/2020, Udenyca Cycle 17 irinotecan/Panitumumab 03/17/2020, Udenyca Cycle 18 irinotecan/panitumumab 04/07/2020, Udenyca Cycle 19 irinotecan/Panitumumab  04/28/2020, Udenyca Cycle 20 irinotecan/panitumumab 05/26/2020, Udenyca Cycle 21 irinotecan/panitumumab 06/30/2020, Udenyca CT abdomen/pelvis 07/19/2020-mildly increased hepatic steatosis.  Increased size of 2.8 cm hypervascular lesion segment 2 left hepatic lobe. Cycle 22 irinotecan/Panitumumab 07/21/2020, Udenyca MRI Abd 07/28/2020: liver lesions consistent with hepatic metastasis Cycle 23 irinotecan/Panitumumab 08/04/2020, Udenyca  Cycle 24 irinotecan/panitumumab 08/22/2020 Irinotecan/panitumumab discontinued secondary to diarrhea and skin  toxicity Guardant 360 on 09/12/2020-K-ras G12V, PIK3CA,NTRK2-VUS, tumor mutation burden 6.85, MSI-high not detected Referred for consideration of Y 90 CTs 10/11/2020-for enlarging hepatic metastatic lesions identified.  No new lesions.  Several tiny nodules in the lungs in the 2 to 3 mm diameter range.  Mild splenomegaly.  Mild left hydronephrosis. 11/17/2020-Y90 lateral segment left lobe of the liver 12/08/2020-Y 90 treatment to the right liver CTs 03/16/2021-multiple new small pulmonary nodules.  Response to therapy in the liver, decreasing size of liver lesions with signs of interval radioembolization; findings suspicious for perianastomotic recurrence in the pelvis; new small volume ascites and some right colonic thickening as well as retroperitoneal thickening. Cycle 1 FOLFOX 04/10/2021 Cycle 2 FOLFOX 04/24/2021 CT abdomen/pelvis 05/06/2021-increased ascites, stable hypodense liver lesions, splenomegaly, soft tissue density at the left pelvis-stable or decreased in size by my measurement Cycle 3 FOLFOX 06/05/2021 FOLFOX discontinued secondary to poor tolerance with fever requiring repeat hospitalization Cycle 1 Lonsurf 06/26/2021, not a candidate for bevacizumab secondary to esophageal varices Cycle 2 Lonsurf 07/24/2021 Cycle 3 Lonsurf 08/28/2021, discontinued at office visit 09/07/2021 CTs 08/30/2021-increased size and number of hepatic metastases, increased loculated ascites,  mild increase in soft tissue prominence at the left lateral rectal wall, no change in subcentimeter bilateral pulmonary nodules Mild elevation of the CEA beginning January 2016 , normal on 05/19/2014   History of iron deficiency anemia   seizure disorder; seizure 04/09/2019.  Brain CT negative.  Now on Keppra. history of depression   4 mm right lower lobe nodule on a staging chest CT 09/08/2013 , stable on a CT 04/07/2014  Hospitalization 09/24/2013 through 09/26/2013 with fever and abdominal pain.   09/24/2013 urine culture positive for coag negative staph.   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  Scope advanced for approximately 25 cm.  Most proximal portion had necrotic appearing debris.  Colostomy bag insufflated suggesting some type of communication between the pouch and the proximal colon.  Introduction of scope into the ostomy found to available directions.  1 toward the distal pouch with similar appearing necrotic debris encountered.  The other was about a 30 cm segment of normal-appearing colonic mucosa to the level of the previous right hemicolectomy ileocolonic anastomosis. Port-A-Cath placement interventional radiology 03/05/2019 Left leg/foot weakness-brain MRI 09/03/2019 with no evidence of metastatic disease, referred to physical therapy Possible abdominal wall abscess status post evaluation by surgery at [facility] 04/03/2019, antibiotics initiated; incision and drainage with purulent material removed 04/22/2019 CT at [facility] 05/04/2019-no fluid collection at the site of the left abdominal wall abscess, "indeterminate" liver lesions CT at [facility] 06/04/2019-persistent open wound of the left abdomen, 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  No malignancy.   16. COVID-19 + 09/10/2019 17. Hospitalized with seizure 09/17/2019 through 09/19/2019-MRI brain without evidence of metastatic disease. Seen by neurology. Keppra dose increased. 19.  Rash secondary to Panitumumab 20.  COVID-19 09/22/2020 21.  Admission 04/24/2021 with a fever-no source for infection identified 22.  Ascites-paracentesis, cytology 05/09/2021, 05/15/2021, 05/30/2021-negative 23.  Admission 06/06/2021 with fever-no source for infection identified 24.  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  Stomach with diffuse portal hypertensive gastropathy.  Mild contact friability.     Disposition: [name] has metastatic colon  cancer.  She is enrolled in home hospice care.  She will continue the current narcotic pain regimen.  We refilled prescriptions for lorazepam and MS Contin.  She requests an increased dose of Remeron.  She will try Preparation H or Anusol HC for the hemorrhoids.  She will try sitz bath's.  [name] will return for an office visit and Port-A-Cath flush in approximately 7 weeks.  Betsy Coder, MD  11/16/2021  3:18 PM

## 2021-12-05 ENCOUNTER — Telehealth: Payer: Self-pay | Admitting: *Deleted

## 2021-12-05 NOTE — Patient Outreach (Signed)
  Care Coordination   Follow Up Visit Note    Call received 12/04/21  Name: ISABELLY KOBLER MRN: [id number] DOB: [birth year]  Mayer Masker is a 46 y.o. year old female who sees [name], [name], MD for primary care. I spoke with  Mayer Masker , who called NP today.  What matters to the patients health and wellness today?  Continue her Hospice care, be comfortable and participate in family activities as tolerated.  SDOH assessments and interventions completed:  No   Care Coordination Interventions Activated:  No  Care Coordination Interventions:  Yes, provided  Listened to and supported pt. She is receiving full Hospice benefits and denies any needs at this time.  Follow up plan: No further intervention required.   Encounter Outcome:  Pt. Visit Completed   Kayleen Memos C. Myrtie Neither, MSN, [facility] Gerontological Nurse Practitioner [facility] Care Management [phone]

## 2021-12-08 ENCOUNTER — Other Ambulatory Visit: Payer: Self-pay

## 2021-12-08 ENCOUNTER — Telehealth: Payer: Self-pay

## 2021-12-08 DIAGNOSIS — C182 Malignant neoplasm of ascending colon: Secondary | ICD-10-CM

## 2021-12-08 MED ORDER — ONDANSETRON HCL 8 MG PO TABS: 8.0000 mg | ORAL_TABLET | Freq: Three times a day (TID) | ORAL | 1 refills | Status: AC | PRN

## 2021-12-08 NOTE — Telephone Encounter (Signed)
[name] from [facility] request a refill of Zofran. Reported the patient had a fall on 12/01/21. She have a bruise on her back and blooding a little more from her rectum.

## 2021-12-22 ENCOUNTER — Other Ambulatory Visit: Payer: Self-pay | Admitting: Nurse Practitioner

## 2021-12-22 ENCOUNTER — Telehealth: Payer: Self-pay

## 2021-12-22 ENCOUNTER — Other Ambulatory Visit: Payer: Self-pay

## 2021-12-22 DIAGNOSIS — C189 Malignant neoplasm of colon, unspecified: Secondary | ICD-10-CM

## 2021-12-22 MED ORDER — MIRTAZAPINE 30 MG PO TABS: 30.0000 mg | ORAL_TABLET | Freq: Every day | ORAL | 3 refills | Status: DC

## 2021-12-22 MED ORDER — PANTOPRAZOLE SODIUM 40 MG PO TBEC: 40.0000 mg | DELAYED_RELEASE_TABLET | Freq: Two times a day (BID) | ORAL | 2 refills | Status: AC

## 2021-12-22 MED ORDER — OXYCODONE HCL 5 MG PO TABS: 5.0000 mg | ORAL_TABLET | ORAL | 0 refills | Status: AC | PRN

## 2021-12-22 MED ORDER — SPIRONOLACTONE 50 MG PO TABS: 50.0000 mg | ORAL_TABLET | Freq: Every day | ORAL | 2 refills | Status: AC

## 2021-12-22 NOTE — Telephone Encounter (Signed)
Arbie Cookey from [place] contacted our office requesting refills.  Also stated patient has been having increased edema in bilateral lower extremities (1+) and wanted to see if patient should increase her dose of spironolactone.  Dr. Benay Spice notified. Per Dr. Benay Spice, increased swelling is most likely due to an increase in abdominal fluid and does not advise increasing the spironolactone dose at this time. Arbie Cookey informed of Dr. Gearldine Shown response and agreed to let patient know.

## 2022-01-04 ENCOUNTER — Telehealth: Payer: Self-pay

## 2022-01-04 ENCOUNTER — Encounter: Payer: Self-pay | Admitting: Nurse Practitioner

## 2022-01-04 ENCOUNTER — Inpatient Hospital Stay: Payer: Medicaid Other | Attending: Oncology

## 2022-01-04 ENCOUNTER — Inpatient Hospital Stay (HOSPITAL_BASED_OUTPATIENT_CLINIC_OR_DEPARTMENT_OTHER): Payer: Medicaid Other | Admitting: Nurse Practitioner

## 2022-01-04 VITALS — BP 119/68 | HR 80 | Temp 98.2°F | Resp 20 | Ht 64.0 in | Wt 136.0 lb

## 2022-01-04 DIAGNOSIS — C189 Malignant neoplasm of colon, unspecified: Secondary | ICD-10-CM | POA: Diagnosis not present

## 2022-01-04 DIAGNOSIS — C787 Secondary malignant neoplasm of liver and intrahepatic bile duct: Secondary | ICD-10-CM | POA: Diagnosis not present

## 2022-01-04 DIAGNOSIS — C7961 Secondary malignant neoplasm of right ovary: Secondary | ICD-10-CM | POA: Insufficient documentation

## 2022-01-04 DIAGNOSIS — Z452 Encounter for adjustment and management of vascular access device: Secondary | ICD-10-CM | POA: Diagnosis not present

## 2022-01-04 DIAGNOSIS — C182 Malignant neoplasm of ascending colon: Secondary | ICD-10-CM | POA: Diagnosis present

## 2022-01-04 DIAGNOSIS — K3189 Other diseases of stomach and duodenum: Secondary | ICD-10-CM | POA: Insufficient documentation

## 2022-01-04 DIAGNOSIS — Z95828 Presence of other vascular implants and grafts: Secondary | ICD-10-CM

## 2022-01-04 DIAGNOSIS — K766 Portal hypertension: Secondary | ICD-10-CM | POA: Insufficient documentation

## 2022-01-04 MED ORDER — HEPARIN SOD (PORK) LOCK FLUSH 100 UNIT/ML IV SOLN
500.0000 [IU] | Freq: Once | INTRAVENOUS | Status: AC
  Administered 2022-01-04: 500 [IU]

## 2022-01-04 MED ORDER — SODIUM CHLORIDE 0.9% FLUSH
10.0000 mL | Freq: Once | INTRAVENOUS | Status: AC
  Administered 2022-01-04: 10 mL

## 2022-01-04 NOTE — Telephone Encounter (Signed)
-----   Message from Owens Shark, NP sent at 01/04/2022  1:03 PM EST ----- We had talked about trying Phenergan for nausea.  Phenergan is on her allergy list as causing increased restless leg.  Has she tried lorazepam for nausea?  She should have some.  Please let me know.

## 2022-01-04 NOTE — Telephone Encounter (Signed)
[name] is currently taking Lorazepam 0.5 mg every 6 hours.

## 2022-01-04 NOTE — Progress Notes (Signed)
[facility] OFFICE PROGRESS NOTE   Diagnosis: Colon cancer  INTERVAL HISTORY:   [name] returns as scheduled.  She is enrolled in the hospice program.  She takes oxycodone as needed for pain.  Some days she does not require pain medication.  Other days she may take 3 or 4 times.  She has intermittent nausea/vomiting.  Zofran is not always effective.  She wonders if there is anything else she can try.  Bowels moving regularly, occasional loose stools.  No improvement in appetite since increasing the dose of Remeron.  Objective:  Vital signs in last 24 hours:  Blood pressure 119/68, pulse 80, temperature 98.2 F (36.8 C), temperature source Oral, resp. rate 20, height _0  (1.626 m), weight 136 lb (61.7 kg), SpO2 100 %.    Resp: Lungs clear bilaterally.  Breath sounds diminished right lower lung field.  No respiratory distress. Cardio: Regular rate and rhythm. GI: Abdomen is distended consistent with ascites. Vascular: Mild pitting edema lower leg bilaterally. Port-A-Cath without erythema.  Lab Results:  Lab Results  Component Value Date   WBC 3.0 (L) 09/07/2021   HGB 9.2 (L) 09/07/2021   HCT 28.4 (L) 09/07/2021   MCV 93.7 09/07/2021   PLT 130 (L) 09/07/2021   NEUTROABS 2.2 09/07/2021    Imaging:  No results found.  Medications: I have reviewed the patient's current medications.  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 The tumor returned microsatellite stable with no loss of mismatch repair protein expression   APC mutated. No BRAF, KRAS, or NRAS mutation On [facility] 1 testing   Cycle 1 adjuvant FOLFOX 09/08/2013   Cycle 2 adjuvant FOLFOX 09/24/2013   Cycle 3 adjuvant FOLFOX 10/08/2013.   Cycle 4 adjuvant FOLFOX 10/22/2013.   Cycle 5 adjuvant FOLFOX 11/05/2013. Oxaliplatin held due to thrombocytopenia. Cycle 6 FOLFOX  11/19/2013. Cycle 7 FOLFOX 12/03/2013. Oxaliplatin held secondary to thrombocytopenia. Cycle 8 FOLFOX 12/17/2013. Cycle 9 FOLFOX 01/04/2014. Oxaliplatin held secondary to neutropenia.   Cycle 10 FOLFOX 01/21/2014. Oxaliplatin held secondary to thrombocytopenia. Cycle 11 FOLFOX 02/04/2014 Cycle 12 FOLFOX 02/18/2014, oxaliplatin dose reduced secondary tothrombocytopenia CT abdomen/pelvis 01/30/2014 revealed splenomegaly and no evidence of recurrent colon cancer CT chest 04/07/2014 with a stable right lower lobe nodule and no evidence for metastatic disease, no nodules seen on the CT 11/26/2014 Markedly elevated CEA 11/24/2014 CT 11/26/2014 revealed a right pelvic mass, splenomegaly, small volume ascites Right salpingo-oophorectomy 12/28/2014 with the pathology confirming metastatic colon cancer CTs 03/23/2016-no evidence of recurrent or metastatic disease. CT 11/26/2016-enlargement of a fluid density structure the right pelvic sidewall, no other evidence of metastatic disease CT aspiration right pelvic cyst 12/19/2016.  Cytology-BENIGN REACTIVE/REPARATIVE CHANGES. CTs 06/05/2017- no evidence of metastatic disease, mild cirrhotic changes with splenomegaly CTs 12/11/2017- recurrent cystic right adnexal mass, similar to on the CT 11/26/2016, stable mild splenomegaly CTs 04/23/2018- enlargement of cystic right adnexal mass with mural nodularity, no other evidence of metastatic disease Cytoreductive surgery/HIPEC with mitomycin by Dr. Clovis Riley at [facility] 08/12/2018-R1 resection achieved.  Cytoreduction included omentectomy, LAR, right salpingo-oophorectomy and left colonic gutter/pelvic stripping.  Pathology on the rectum showed recurrent/metastatic adenocarcinoma, tumor 2.0 cm, predominantly involving the subserosa and muscularis propria of the colon, proximal and distal margins of resection were negative, vascular invasion present, metastatic carcinoma present in 1 out of 5 lymph nodes; omentum resection with  no malignancy seen, no metastatic carcinoma identified in 1 lymph node examined; left gutter  stripping positive metastatic adenocarcinoma; right ovary resection positive metastatic adenocarcinoma. 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 CEA 68 02/03/2019 Biopsy liver lesion 02/11/2019-metastatic adenocarcinoma consistent with primary colonic adenocarcinoma CTs 02/27/2019-multiple liver lesions increased in size, new lesion in the lateral right lobe of the liver.  No evidence of metastatic disease in the chest.  Redemonstrated moderate left hydronephrosis and proximal hydroureter without discrete lesion or obstructing etiology at the transition point of the mid ureter. Cycle 1 FOLFIRI/Panitumumab 03/12/2019 Cycle 2 FOLFIRI/Panitumumab 03/26/2019-bolus 5-FU and irinotecan held secondary to neutropenia Cycle 3 FOLFIRI/Panitumumab 04/09/2019, Udenyca added-not given secondary to seizure/discontinuation of the 5-FU pump CT abdomen/pelvis at [facility] 05/04/2019-no residual fluid collection at the left abdominal wall abscess, stable moderate left hydronephrosis, multifocal indeterminate liver lesions--liver lesions significantly improved Cycle 4 FOLFIRI/Panitumumab 05/07/2019, Udenyca Cycle 5 FOLFIRI/Panitumumab 05/20/2019, Udenyca Cycle 6 FOLFIRI/Panitumumab 06/18/2019, Udenyca Cycle 7 FOLFIRI/Panitumumab 07/01/2019 (Irinotecan dose reduced due to thrombocytopenia), Udenyca--Udenyca was not given Cycle 8 FOLFIRI/Panitumumab 07/15/2019, Udenyca Cycle 9 FOLFIRI/Panitumumab 07/29/2019, Udenyca Cycle 10 FOLFIRI/Panitumumab 08/13/2019, Udenyca CTs at [facility] 08/19/2019-decreased size of the hypodense and hyperdense lesions within segment 2 of the left hepatic lobe.  No new lesions identified.  Similar presacral soft tissue thickening with adjacent stable alignment in the rectum.  Improved but persistent left UPJ obstruction with mild left  hydronephrosis.  Persistent but decreased size of the wound within the left mid abdomen abdominal wall. Cycle 11 irinotecan/Panitumumab 08/27/2019, Udenyca Cycle 12 irinotecan/Panitumumab 10/08/2019, Udenyca Cycle 13 irinotecan/Panitumumab 11/05/2019, Udenyca CT abdomen/pelvis 11/19/2019-no new or progressive interval findings.  Stable appearance of the calcified lesion in the anterior left hepatic dome with second tiny low-density lesion in the dome of the lateral segment left liver.  Both lesions are markedly decreased since 02/27/2019.  Stable mild fullness left intrarenal collecting system and renal pelvis.  Similar appearance of abnormal soft tissue in the left paramidline subcutaneous fat with associated skin thickening, this likely correlates to the fistula. Cycle 14 irinotecan/Panitumumab 11/19/2019, Udenyca Cycle 15 irinotecan/Panitumumab 12/02/2019, Udenyca CT abdomen/pelvis at [facility] 02/11/2020- similar to slight decrease in segment 2 hypodense and hyperdense lesions (compared to a CT from July 2020), no new lesions, splenomegaly, no fluid collection or abscess, mild stranding adjacent to the incision with overlying skin thickening Cycle 16 irinotecan/Panitumumab 03/03/2020, Udenyca Cycle 17 irinotecan/Panitumumab 03/17/2020, Udenyca Cycle 18 irinotecan/panitumumab 04/07/2020, Udenyca Cycle 19 irinotecan/Panitumumab 04/28/2020, Udenyca Cycle 20 irinotecan/panitumumab 05/26/2020, Udenyca Cycle 21 irinotecan/panitumumab 06/30/2020, Udenyca CT abdomen/pelvis 07/19/2020-mildly increased hepatic steatosis.  Increased size of 2.8 cm hypervascular lesion segment 2 left hepatic lobe. Cycle 22 irinotecan/Panitumumab 07/21/2020, Udenyca MRI Abd 07/28/2020: liver lesions consistent with hepatic metastasis Cycle 23 irinotecan/Panitumumab 08/04/2020, Udenyca  Cycle 24 irinotecan/panitumumab 08/22/2020 Irinotecan/panitumumab discontinued secondary to diarrhea and skin toxicity Guardant 360 on 09/12/2020-K-ras G12V,  PIK3CA,NTRK2-VUS, tumor mutation burden 6.85, MSI-high not detected Referred for consideration of Y 90 CTs 10/11/2020-for enlarging hepatic metastatic lesions identified.  No new lesions.  Several tiny nodules in the lungs in the 2 to 3 mm diameter range.  Mild splenomegaly.  Mild left hydronephrosis. 11/17/2020-Y90 lateral segment left lobe of the liver 12/08/2020-Y 90 treatment to the right liver CTs 03/16/2021-multiple new small pulmonary nodules.  Response to therapy in the liver, decreasing size of liver lesions with signs of interval radioembolization; findings suspicious for perianastomotic recurrence in the pelvis; new small volume ascites and some right colonic thickening as well as retroperitoneal thickening. Cycle 1 FOLFOX 04/10/2021 Cycle 2 FOLFOX 04/24/2021 CT abdomen/pelvis 05/06/2021-increased ascites, stable  hypodense liver lesions, splenomegaly, soft tissue density at the left pelvis-stable or decreased in size by my measurement Cycle 3 FOLFOX 06/05/2021 FOLFOX discontinued secondary to poor tolerance with fever requiring repeat hospitalization Cycle 1 Lonsurf 06/26/2021, not a candidate for bevacizumab secondary to esophageal varices Cycle 2 Lonsurf 07/24/2021 Cycle 3 Lonsurf 08/28/2021, discontinued at office visit 09/07/2021 CTs 08/30/2021-increased size and number of hepatic metastases, increased loculated ascites, mild increase in soft tissue prominence at the left lateral rectal wall, no change in subcentimeter bilateral pulmonary nodules Mild elevation of the CEA beginning January 2016 , normal on 05/19/2014   History of iron deficiency anemia   seizure disorder; seizure 04/09/2019.  Brain CT negative.  Now on Keppra. history of depression   4 mm right lower lobe nodule on a staging chest CT 09/08/2013 , stable on a CT 04/07/2014  Hospitalization 09/24/2013 through 09/26/2013 with fever and abdominal pain.   09/24/2013 urine culture positive for coag negative staph.   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  Scope advanced for approximately 25 cm.  Most proximal portion had necrotic appearing debris.  Colostomy bag insufflated suggesting some type of communication between the pouch and the proximal colon.  Introduction of scope into the ostomy found to available directions.  1 toward the distal pouch with similar appearing necrotic debris encountered.  The other was about a 30 cm segment of normal-appearing colonic mucosa to the level of the previous right hemicolectomy ileocolonic anastomosis. Port-A-Cath placement interventional radiology 03/05/2019 Left leg/foot weakness-brain MRI 09/03/2019 with no evidence of metastatic disease, referred to physical therapy Possible abdominal wall abscess status post evaluation by surgery at [facility] 04/03/2019, antibiotics initiated; incision and drainage with purulent material removed 04/22/2019 CT at [facility] 05/04/2019-no fluid collection at the site of the left abdominal wall abscess, "indeterminate" liver lesions CT at [facility] 06/04/2019-persistent open wound of the left abdomen, 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  No malignancy.   16. COVID-19 + 09/10/2019 17. Hospitalized with seizure 09/17/2019 through 09/19/2019-MRI brain without evidence of metastatic disease. Seen by neurology. Keppra dose increased. 19.  Rash secondary to Panitumumab 20.  COVID-19 09/22/2020 21.  Admission 04/24/2021 with a fever-no source for infection identified 22.  Ascites-paracentesis, cytology 05/09/2021, 05/15/2021, 05/30/2021-negative 23.  Admission 06/06/2021 with fever-no  source for infection identified 24.  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  Stomach with diffuse portal hypertensive gastropathy.  Mild contact friability.    Disposition: [name] has metastatic colon cancer.  She is enrolled in the home hospice program.  Pain is well-controlled on the current narcotic pain regimen.  She is having significant nausea.  She will try lorazepam.  She will return for port flush and follow-up in approximately 6 weeks.  We are available to see her sooner if needed.    Ned Card ANP/GNP-BC   01/04/2022  11:54 AM

## 2022-01-06 ENCOUNTER — Other Ambulatory Visit (HOSPITAL_COMMUNITY): Payer: Self-pay

## 2022-01-12 ENCOUNTER — Other Ambulatory Visit: Payer: Self-pay | Admitting: Nurse Practitioner

## 2022-01-12 ENCOUNTER — Telehealth: Payer: Self-pay

## 2022-01-12 DIAGNOSIS — C189 Malignant neoplasm of colon, unspecified: Secondary | ICD-10-CM

## 2022-01-12 MED ORDER — SCOPOLAMINE 1 MG/3DAYS TD PT72: 1.0000 | MEDICATED_PATCH | TRANSDERMAL | 0 refills | Status: AC

## 2022-01-12 MED ORDER — MORPHINE SULFATE ER 30 MG PO TBCR: 30.0000 mg | EXTENDED_RELEASE_TABLET | Freq: Two times a day (BID) | ORAL | 0 refills | Status: AC

## 2022-01-12 NOTE — Telephone Encounter (Signed)
Hospice nurse Arbie Cookey called  and request a refill of Mrs. Davilla Morphine and Remeron. She have BLE edema  (2+) and vomiting. She is not keeping anything down and just want to try hyoscine patch. I left Lattie Haw know and she going to refill prescriptions. Arbie Cookey going to call back Monday to let us know how the hyoscine patch working.

## 2022-01-19 ENCOUNTER — Other Ambulatory Visit: Payer: Self-pay | Admitting: *Deleted

## 2022-01-19 ENCOUNTER — Other Ambulatory Visit: Payer: Self-pay | Admitting: Nurse Practitioner

## 2022-01-19 ENCOUNTER — Telehealth: Payer: Self-pay

## 2022-01-19 DIAGNOSIS — C182 Malignant neoplasm of ascending colon: Secondary | ICD-10-CM

## 2022-01-19 MED ORDER — ONDANSETRON 8 MG PO TBDP: 8.0000 mg | ORAL_TABLET | Freq: Three times a day (TID) | ORAL | 1 refills | Status: AC | PRN

## 2022-01-19 MED ORDER — LORAZEPAM 0.5 MG PO TABS: 0.5000 mg | ORAL_TABLET | Freq: Four times a day (QID) | ORAL | 0 refills | Status: DC | PRN

## 2022-01-19 MED ORDER — MIRTAZAPINE 30 MG PO TABS: 30.0000 mg | ORAL_TABLET | Freq: Every day | ORAL | 3 refills | Status: AC

## 2022-01-19 NOTE — Telephone Encounter (Signed)
Carol form [facility] called to request a refill of Remeron, Lorazepam and  to report Mr [name] is still nausea and vomiting. She did the Hyoscine patch, the patch is working ok. Arbie Cookey  wants to know if there anything else to try for nausea.

## 2022-02-15 ENCOUNTER — Inpatient Hospital Stay: Payer: Medicaid Other | Admitting: Oncology

## 2022-02-15 ENCOUNTER — Inpatient Hospital Stay: Payer: Medicaid Other | Attending: Oncology

## 2022-02-20 ENCOUNTER — Encounter: Payer: Self-pay | Admitting: *Deleted

## 2022-02-20 NOTE — Progress Notes (Signed)
Spoke w/Hospice nurse, Anderson Malta for update on status. She reports a significant decline. She is bedbound and no longer talks and not eating. Only takes sips of water. Takes her morphine every 2 hours. Husband her mother are struggling emotionally.

## 2022-02-22 ENCOUNTER — Other Ambulatory Visit: Payer: Self-pay

## 2022-02-22 ENCOUNTER — Telehealth: Payer: Self-pay

## 2022-02-22 ENCOUNTER — Other Ambulatory Visit: Payer: Self-pay | Admitting: Nurse Practitioner

## 2022-02-22 DIAGNOSIS — C189 Malignant neoplasm of colon, unspecified: Secondary | ICD-10-CM

## 2022-02-22 MED ORDER — LORAZEPAM 2 MG/ML PO CONC: 1.0000 mg | ORAL | 0 refills | Status: AC

## 2022-02-22 NOTE — Telephone Encounter (Signed)
Spoke with hospice nurse Arbie Cookey and she request ativan liquid  solution. The new order was called in to [employer]. Arbie Cookey states the patient is very  anxiety, unresponsive, not eating, with a high heart rate.  Pt is in her terminal phase.

## 2022-02-26 ENCOUNTER — Encounter: Payer: Self-pay | Admitting: *Deleted

## 2022-03-08 NOTE — Progress Notes (Signed)
Notified by Hospice that patient died today. MD aware.

## 2022-03-08 DEATH — deceased

## 2022-07-11 ENCOUNTER — Ambulatory Visit: Payer: Medicaid Other | Admitting: Neurology

## 2023-05-03 IMAGING — MR MR ABDOMEN WO/W CM
19 of 22 series · 45 of 48 positions shown · IV contrast (10 GADAVIST)
Comparison: CT on 07/19/2020

CLINICAL DATA: Liver mass on recent CT.  Colon carcinoma.

EXAM:
MRI ABDOMEN WITHOUT AND WITH CONTRAST
TECHNIQUE: Multiplanar multisequence MR imaging of the abdomen was performed
both before and after the administration of intravenous contrast.
CONTRAST:  7mL GADAVIST GADOBUTROL 1 MMOL/ML IV SOLN

[Series 3: T2 · coronal · 6.0mm · 1.56mm/px · 1 of 30 slices shown (1 of 2)]
[im 1/30]
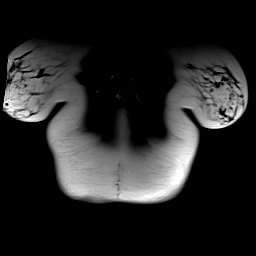

[Series 4: T2 fat-sat · 1 of 6 slices shown (1 of 2)]
[im 1/6]
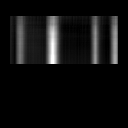

[Series 5: T2 fat-sat · axial · 6.0mm · 1.25mm/px · 1 of 36 slices shown (2 of 2)]
[im 1/36]
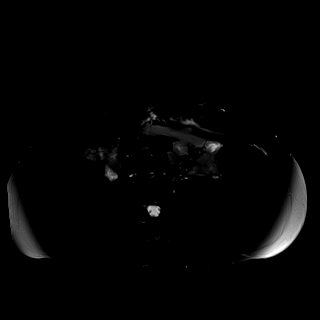

[Series 6: T1 · axial · 3.0mm · 1.25mm/px · z∈[-162,+75]mm · 2 of 80 slices shown (1 of 2)]
[im 1/80]
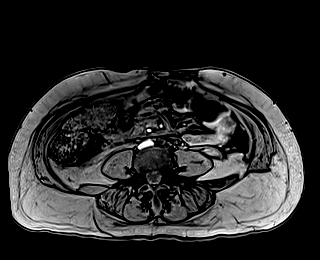
[im 80/80]
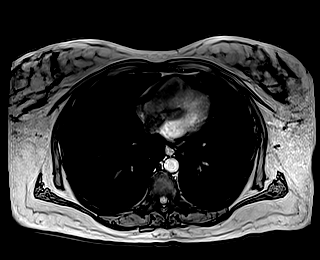

[Series 7: T1 · axial · 3.0mm · 1.25mm/px · z∈[-162,+75]mm · 3 of 80 slices shown (2 of 2)]
[im 1/80]
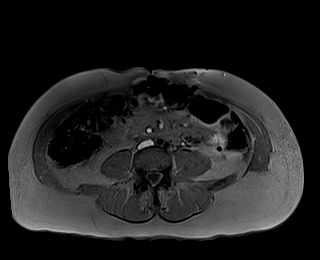
[im 40/80]
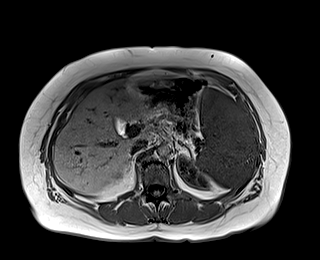
[im 80/80]
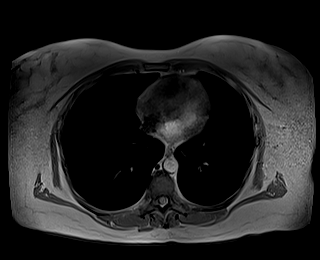

[Series 8: DWI · axial · 6.0mm · 1.49mm/px · z∈[-170,+82]mm · 3 of 72 slices shown (1 of 2)]
[im 1/72]
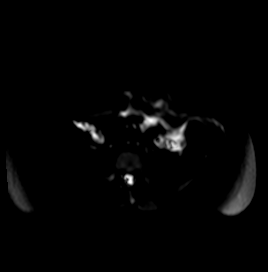
[im 36/72]
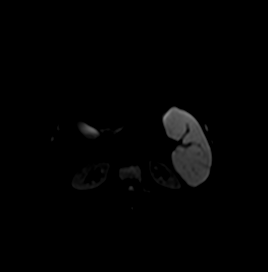
[im 72/72]
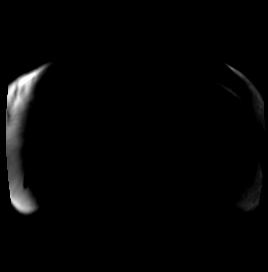

[Series 9: DWI · axial · 6.0mm · 1.49mm/px · 1 of 36 slices shown (2 of 2)]
[im 1/36]
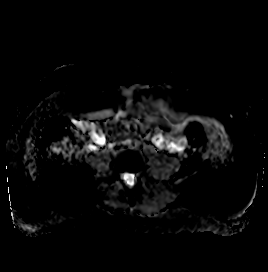

[Series 10: bSSFP · axial · 4.0mm · 0.84mm/px · z∈[-156,+68]mm · 2 of 57 slices shown]
[im 1/57]
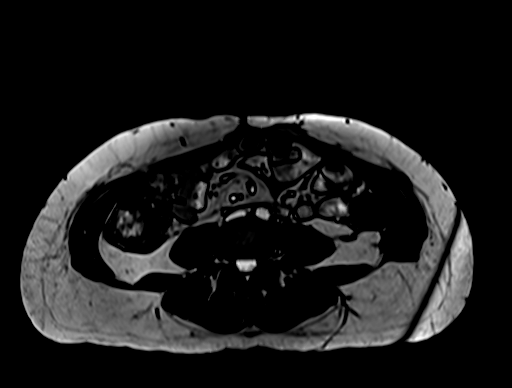
[im 57/57]
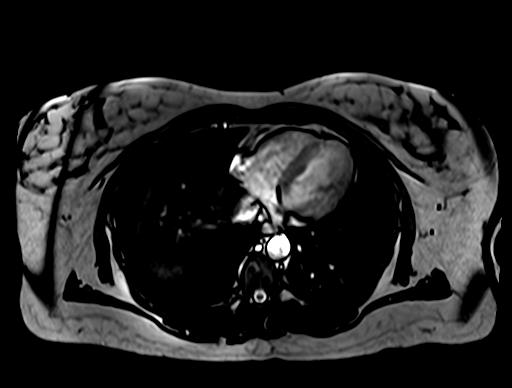

[Series 12: T1 dynamic · axial · 3.0mm · 1.25mm/px · z∈[-174,+87]mm · 3 of 88 slices shown (1 of 10)]
[im 1/88]
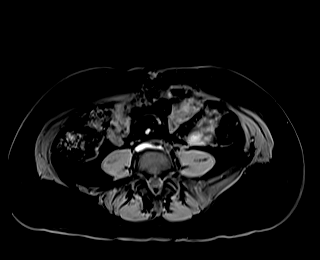
[im 44/88]
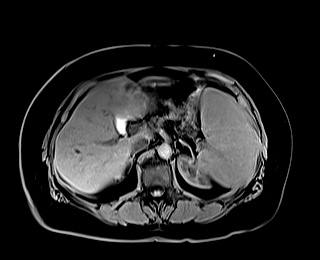
[im 88/88]
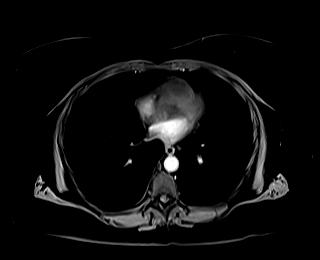

[Series 16: T1 dynamic · axial · 3.0mm · 1.25mm/px · z∈[-174,+87]mm · 3 of 88 slices shown (2 of 10)]
[im 1/88]
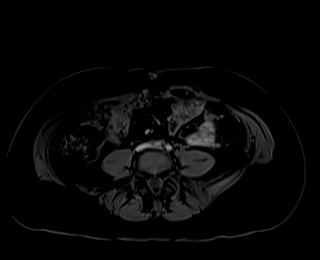
[im 44/88]
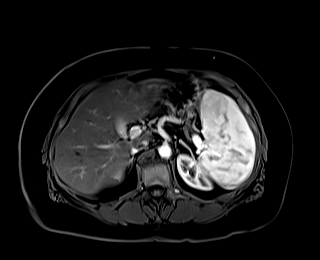
[im 88/88]
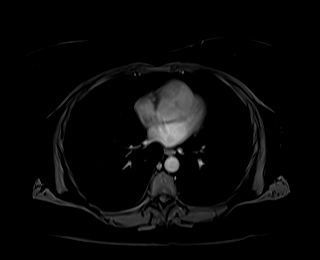

[Series 17: T1 dynamic · axial · 3.0mm · 1.25mm/px · z∈[-174,+87]mm · 3 of 88 slices shown (3 of 10)]
[im 1/88]
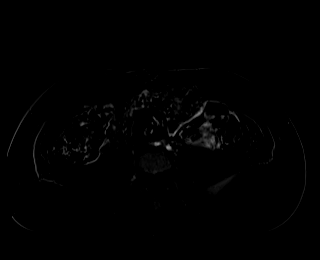
[im 44/88]
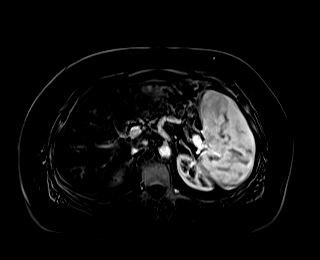
[im 88/88]
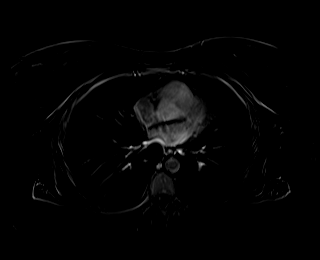

[Series 20: T1 dynamic · axial · 3.0mm · 1.25mm/px · z∈[-174,+87]mm · 3 of 88 slices shown (4 of 10)]
[im 1/88]
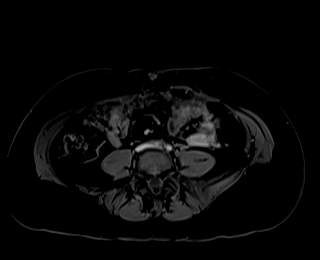
[im 44/88]
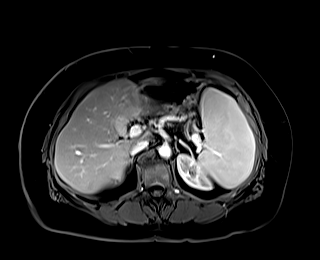
[im 88/88]
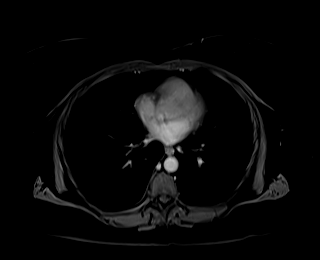

[Series 21: T1 dynamic · axial · 3.0mm · 1.25mm/px · z∈[-174,+87]mm · 3 of 88 slices shown (5 of 10)]
[im 1/88]
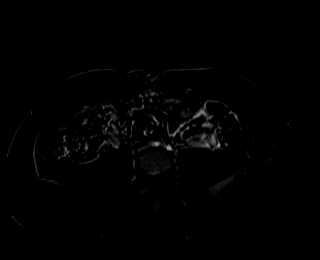
[im 44/88]
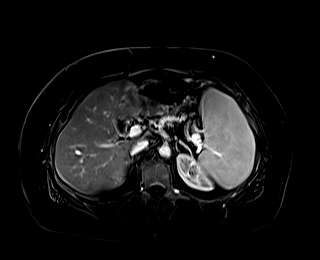
[im 88/88]
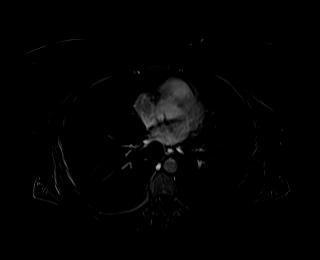

[Series 24: T1 dynamic · axial · 3.0mm · 1.25mm/px · z∈[-174,+87]mm · 3 of 88 slices shown (6 of 10)]
[im 1/88]
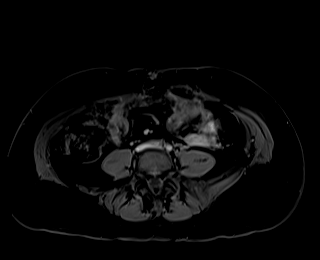
[im 44/88]
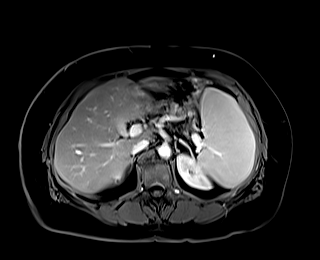
[im 88/88]
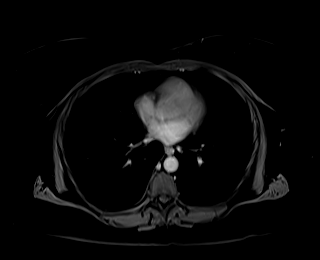

[Series 25: T1 dynamic · axial · 3.0mm · 1.25mm/px · z∈[-174,+87]mm · 3 of 88 slices shown (7 of 10)]
[im 1/88]
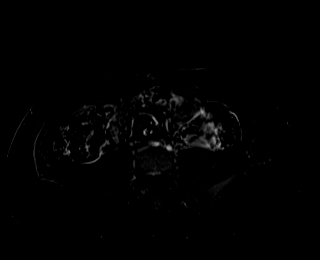
[im 44/88]
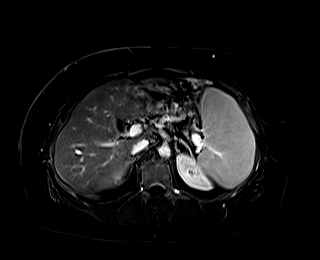
[im 88/88]
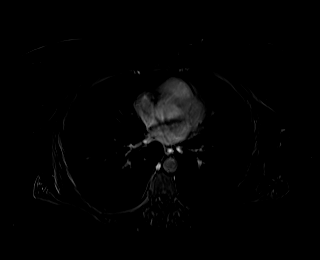

[Series 27: T1 dynamic · coronal · 3.0mm · 1.47mm/px · 3 of 72 slices shown (8 of 10)]
[im 1/72]
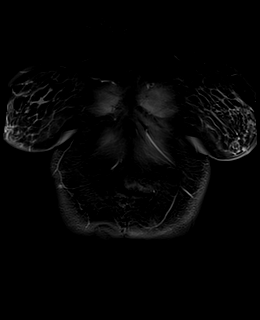
[im 36/72]
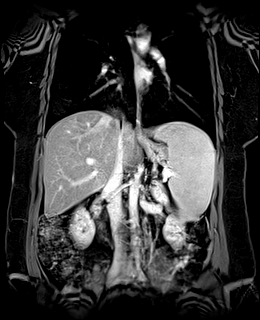
[im 72/72]
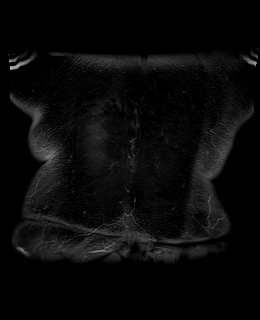

[Series 28: T2 · axial · 6.0mm · 1.56mm/px · 1 of 35 slices shown (2 of 2)]
[im 1/35]
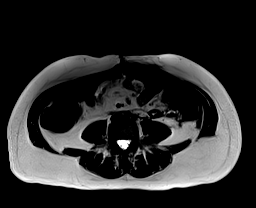

[Series 31: T1 dynamic · axial · 3.0mm · 1.25mm/px · z∈[-174,+87]mm · 3 of 88 slices shown (9 of 10)]
[im 1/88]
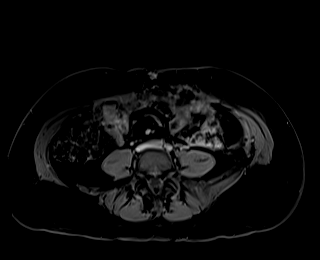
[im 44/88]
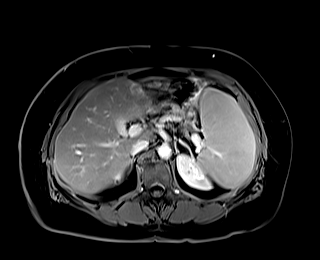
[im 88/88]
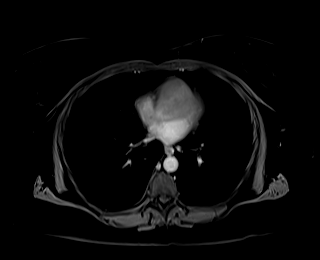

[Series 32: T1 dynamic · axial · 3.0mm · 1.25mm/px · z∈[-174,+87]mm · 3 of 88 slices shown (10 of 10)]
[im 1/88]
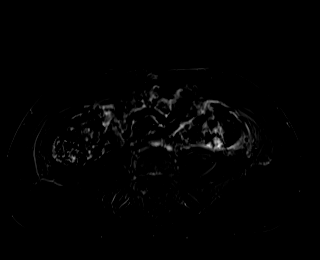
[im 44/88]
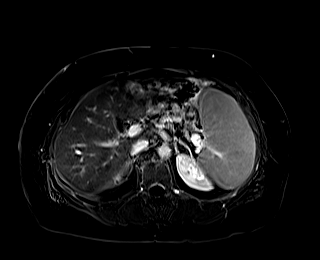
[im 88/88]
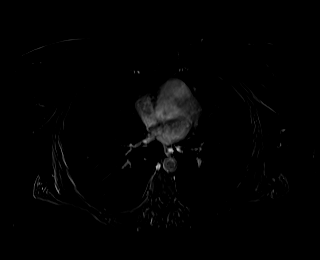

[45 of 48 positions shown; findings below may reference images not displayed]

FINDINGS: Lower chest: No acute findings.

Hepatobiliary: Mild-to-moderate diffuse hepatic steatosis is seen.
Multiple heterogeneously enhancing masses are seen in the right and
left hepatic lobes, consistent with liver metastases. The largest of
these is located in segment 4A of the left lobe measuring 3.4 x
cm. Gallbladder is unremarkable. No evidence of biliary ductal
dilatation.

Pancreas:  No mass or inflammatory changes.

Spleen:  Stable mild splenomegaly.  No splenic masses identified.

Adrenals/Urinary Tract: No masses identified. No evidence of
hydronephrosis.

Stomach/Bowel: Visualized portion unremarkable.

Vascular/Lymphatic: No pathologically enlarged lymph nodes
identified. No acute vascular findings.

Other:  None.

Musculoskeletal:  No suspicious bone lesions identified.
IMPRESSION: Multiple heterogeneously enhancing liver masses, consistent with
liver metastases.

No other sites of metastatic disease identified.

Stable hepatic steatosis and splenomegaly.
